# Patient Record
Sex: Female | Born: 1945 | Race: White | Hispanic: No | Marital: Married | Smoking: Former smoker
Health system: Southern US, Community
[De-identification: ages and names within clinical notes are randomized; demographics above are authoritative.]

## PROBLEM LIST (undated history)

## (undated) DIAGNOSIS — Z9289 Personal history of other medical treatment: Secondary | ICD-10-CM

## (undated) DIAGNOSIS — Z720 Tobacco use: Secondary | ICD-10-CM

## (undated) DIAGNOSIS — E1029 Type 1 diabetes mellitus with other diabetic kidney complication: Secondary | ICD-10-CM

## (undated) DIAGNOSIS — I10 Essential (primary) hypertension: Secondary | ICD-10-CM

## (undated) DIAGNOSIS — Z87898 Personal history of other specified conditions: Secondary | ICD-10-CM

## (undated) DIAGNOSIS — F329 Major depressive disorder, single episode, unspecified: Secondary | ICD-10-CM

## (undated) DIAGNOSIS — S62609A Fracture of unspecified phalanx of unspecified finger, initial encounter for closed fracture: Secondary | ICD-10-CM

## (undated) DIAGNOSIS — W19XXXA Unspecified fall, initial encounter: Secondary | ICD-10-CM

## (undated) DIAGNOSIS — K219 Gastro-esophageal reflux disease without esophagitis: Secondary | ICD-10-CM

## (undated) DIAGNOSIS — E079 Disorder of thyroid, unspecified: Secondary | ICD-10-CM

## (undated) DIAGNOSIS — S4290XA Fracture of unspecified shoulder girdle, part unspecified, initial encounter for closed fracture: Secondary | ICD-10-CM

## (undated) DIAGNOSIS — D649 Anemia, unspecified: Secondary | ICD-10-CM

## (undated) DIAGNOSIS — J309 Allergic rhinitis, unspecified: Secondary | ICD-10-CM

## (undated) DIAGNOSIS — J189 Pneumonia, unspecified organism: Secondary | ICD-10-CM

## (undated) DIAGNOSIS — N3010 Interstitial cystitis (chronic) without hematuria: Secondary | ICD-10-CM

## (undated) DIAGNOSIS — E538 Deficiency of other specified B group vitamins: Secondary | ICD-10-CM

## (undated) DIAGNOSIS — H8110 Benign paroxysmal vertigo, unspecified ear: Secondary | ICD-10-CM

## (undated) DIAGNOSIS — F319 Bipolar disorder, unspecified: Secondary | ICD-10-CM

## (undated) DIAGNOSIS — M542 Cervicalgia: Secondary | ICD-10-CM

## (undated) DIAGNOSIS — Y92129 Unspecified place in nursing home as the place of occurrence of the external cause: Secondary | ICD-10-CM

## (undated) DIAGNOSIS — I509 Heart failure, unspecified: Secondary | ICD-10-CM

## (undated) DIAGNOSIS — E7800 Pure hypercholesterolemia, unspecified: Secondary | ICD-10-CM

## (undated) DIAGNOSIS — J449 Chronic obstructive pulmonary disease, unspecified: Secondary | ICD-10-CM

## (undated) DIAGNOSIS — H409 Unspecified glaucoma: Secondary | ICD-10-CM

## (undated) DIAGNOSIS — M1990 Unspecified osteoarthritis, unspecified site: Secondary | ICD-10-CM

## (undated) DIAGNOSIS — G43909 Migraine, unspecified, not intractable, without status migrainosus: Secondary | ICD-10-CM

## (undated) DIAGNOSIS — F419 Anxiety disorder, unspecified: Secondary | ICD-10-CM

## (undated) DIAGNOSIS — S92919A Unspecified fracture of unspecified toe(s), initial encounter for closed fracture: Secondary | ICD-10-CM

## (undated) DIAGNOSIS — I129 Hypertensive chronic kidney disease with stage 1 through stage 4 chronic kidney disease, or unspecified chronic kidney disease: Secondary | ICD-10-CM

## (undated) DIAGNOSIS — N183 Chronic kidney disease, stage 3 unspecified: Secondary | ICD-10-CM

## (undated) DIAGNOSIS — E039 Hypothyroidism, unspecified: Secondary | ICD-10-CM

## (undated) DIAGNOSIS — R748 Abnormal levels of other serum enzymes: Secondary | ICD-10-CM

## (undated) DIAGNOSIS — L409 Psoriasis, unspecified: Secondary | ICD-10-CM

## (undated) DIAGNOSIS — F32A Depression, unspecified: Secondary | ICD-10-CM

## (undated) DIAGNOSIS — I639 Cerebral infarction, unspecified: Secondary | ICD-10-CM

## (undated) HISTORY — DX: Fracture of unspecified shoulder girdle, part unspecified, initial encounter for closed fracture: S42.90XA

## (undated) HISTORY — PX: APPENDECTOMY: SHX54

## (undated) HISTORY — DX: Psoriasis, unspecified: L40.9

## (undated) HISTORY — DX: Migraine, unspecified, not intractable, without status migrainosus: G43.909

## (undated) HISTORY — DX: Major depressive disorder, single episode, unspecified: F32.9

## (undated) HISTORY — DX: Bipolar disorder, unspecified: F31.9

## (undated) HISTORY — DX: Deficiency of other specified B group vitamins: E53.8

## (undated) HISTORY — DX: Interstitial cystitis (chronic) without hematuria: N30.10

## (undated) HISTORY — DX: Chronic kidney disease, stage 3 unspecified: N18.30

## (undated) HISTORY — PX: BACK SURGERY: SHX140

## (undated) HISTORY — PX: DILATION AND CURETTAGE OF UTERUS: SHX78

## (undated) HISTORY — DX: Chronic kidney disease, stage 3 (moderate): N18.3

## (undated) HISTORY — DX: Abnormal levels of other serum enzymes: R74.8

## (undated) HISTORY — DX: Tobacco use: Z72.0

## (undated) HISTORY — PX: TUBAL LIGATION: SHX77

## (undated) HISTORY — DX: Type 1 diabetes mellitus with other diabetic kidney complication: E10.29

## (undated) HISTORY — PX: CATARACT EXTRACTION W/ INTRAOCULAR LENS  IMPLANT, BILATERAL: SHX1307

## (undated) HISTORY — PX: TONSILLECTOMY: SUR1361

## (undated) HISTORY — DX: Depression, unspecified: F32.A

## (undated) HISTORY — PX: HERNIA REPAIR: SHX51

## (undated) HISTORY — DX: Allergic rhinitis, unspecified: J30.9

## (undated) HISTORY — DX: Personal history of other specified conditions: Z87.898

## (undated) HISTORY — DX: Fracture of unspecified phalanx of unspecified finger, initial encounter for closed fracture: S62.609A

## (undated) HISTORY — PX: BLADDER SUSPENSION: SHX72

## (undated) HISTORY — DX: Unspecified glaucoma: H40.9

## (undated) HISTORY — DX: Unspecified fracture of unspecified toe(s), initial encounter for closed fracture: S92.919A

## (undated) HISTORY — PX: ANTERIOR CERVICAL DECOMP/DISCECTOMY FUSION: SHX1161

## (undated) HISTORY — PX: VAGINAL HYSTERECTOMY: SUR661

## (undated) HISTORY — DX: Anxiety disorder, unspecified: F41.9

## (undated) HISTORY — PX: LAPAROSCOPIC CHOLECYSTECTOMY: SUR755

## (undated) HISTORY — DX: Cerebral infarction, unspecified: I63.9

## (undated) HISTORY — DX: Hypertensive chronic kidney disease with stage 1 through stage 4 chronic kidney disease, or unspecified chronic kidney disease: I12.9

## (undated) HISTORY — DX: Cervicalgia: M54.2

## (undated) HISTORY — DX: Hypothyroidism, unspecified: E03.9

## (undated) HISTORY — DX: Gastro-esophageal reflux disease without esophagitis: K21.9

## (undated) HISTORY — DX: Benign paroxysmal vertigo, unspecified ear: H81.10

---

## 1999-08-10 ENCOUNTER — Encounter: Payer: Self-pay | Admitting: Emergency Medicine

## 1999-08-10 ENCOUNTER — Emergency Department (HOSPITAL_COMMUNITY): Admission: EM | Admit: 1999-08-10 | Discharge: 1999-08-10 | Payer: Self-pay | Admitting: Emergency Medicine

## 1999-09-30 ENCOUNTER — Encounter: Payer: Self-pay | Admitting: Neurosurgery

## 1999-09-30 ENCOUNTER — Ambulatory Visit (HOSPITAL_COMMUNITY): Admission: RE | Admit: 1999-09-30 | Discharge: 1999-09-30 | Payer: Self-pay | Admitting: Neurosurgery

## 1999-11-05 ENCOUNTER — Encounter: Payer: Self-pay | Admitting: Neurosurgery

## 1999-11-06 ENCOUNTER — Observation Stay (HOSPITAL_COMMUNITY): Admission: RE | Admit: 1999-11-06 | Discharge: 1999-11-07 | Payer: Self-pay | Admitting: Neurosurgery

## 1999-11-06 ENCOUNTER — Encounter: Payer: Self-pay | Admitting: Neurosurgery

## 1999-12-24 ENCOUNTER — Ambulatory Visit (HOSPITAL_COMMUNITY): Admission: RE | Admit: 1999-12-24 | Discharge: 1999-12-24 | Payer: Self-pay | Admitting: Neurosurgery

## 1999-12-24 ENCOUNTER — Encounter: Payer: Self-pay | Admitting: Neurosurgery

## 2000-02-01 ENCOUNTER — Ambulatory Visit (HOSPITAL_COMMUNITY): Admission: RE | Admit: 2000-02-01 | Discharge: 2000-02-01 | Payer: Self-pay | Admitting: Neurosurgery

## 2000-02-01 ENCOUNTER — Encounter: Payer: Self-pay | Admitting: Neurosurgery

## 2001-03-31 ENCOUNTER — Ambulatory Visit (HOSPITAL_COMMUNITY): Admission: RE | Admit: 2001-03-31 | Discharge: 2001-03-31 | Payer: Self-pay | Admitting: Urology

## 2005-04-10 ENCOUNTER — Ambulatory Visit: Payer: Self-pay | Admitting: Otolaryngology

## 2006-02-18 ENCOUNTER — Ambulatory Visit: Payer: Self-pay | Admitting: Specialist

## 2006-07-07 ENCOUNTER — Encounter: Admission: RE | Admit: 2006-07-07 | Discharge: 2006-07-07 | Payer: Self-pay | Admitting: Neurosurgery

## 2006-10-23 ENCOUNTER — Ambulatory Visit: Payer: Self-pay | Admitting: Specialist

## 2006-12-11 ENCOUNTER — Other Ambulatory Visit: Payer: Self-pay

## 2006-12-11 ENCOUNTER — Emergency Department: Payer: Self-pay | Admitting: Unknown Physician Specialty

## 2006-12-29 ENCOUNTER — Inpatient Hospital Stay: Payer: Self-pay | Admitting: Specialist

## 2006-12-29 ENCOUNTER — Other Ambulatory Visit: Payer: Self-pay

## 2006-12-30 ENCOUNTER — Other Ambulatory Visit: Payer: Self-pay

## 2007-01-13 ENCOUNTER — Ambulatory Visit: Payer: Self-pay | Admitting: Unknown Physician Specialty

## 2007-01-16 ENCOUNTER — Ambulatory Visit: Payer: Self-pay | Admitting: Unknown Physician Specialty

## 2007-03-04 ENCOUNTER — Observation Stay: Payer: Self-pay | Admitting: Specialist

## 2007-05-26 ENCOUNTER — Ambulatory Visit (HOSPITAL_BASED_OUTPATIENT_CLINIC_OR_DEPARTMENT_OTHER): Admission: RE | Admit: 2007-05-26 | Discharge: 2007-05-26 | Payer: Self-pay | Admitting: Urology

## 2007-06-23 ENCOUNTER — Other Ambulatory Visit: Payer: Self-pay

## 2007-06-23 ENCOUNTER — Ambulatory Visit: Payer: Self-pay | Admitting: Specialist

## 2007-07-03 ENCOUNTER — Other Ambulatory Visit: Payer: Self-pay

## 2007-07-03 ENCOUNTER — Observation Stay: Payer: Self-pay | Admitting: Specialist

## 2007-09-21 ENCOUNTER — Emergency Department: Payer: Self-pay | Admitting: Unknown Physician Specialty

## 2007-09-21 ENCOUNTER — Other Ambulatory Visit: Payer: Self-pay

## 2008-05-10 ENCOUNTER — Ambulatory Visit (HOSPITAL_BASED_OUTPATIENT_CLINIC_OR_DEPARTMENT_OTHER): Admission: RE | Admit: 2008-05-10 | Discharge: 2008-05-10 | Payer: Self-pay | Admitting: Urology

## 2008-07-15 ENCOUNTER — Ambulatory Visit: Payer: Self-pay | Admitting: Family Medicine

## 2009-04-17 ENCOUNTER — Observation Stay: Payer: Self-pay | Admitting: Internal Medicine

## 2009-09-29 ENCOUNTER — Ambulatory Visit: Payer: Self-pay | Admitting: Family Medicine

## 2009-10-03 ENCOUNTER — Emergency Department: Payer: Self-pay | Admitting: Emergency Medicine

## 2010-04-09 ENCOUNTER — Ambulatory Visit: Payer: Self-pay | Admitting: Family Medicine

## 2010-05-23 ENCOUNTER — Emergency Department: Payer: Self-pay | Admitting: Internal Medicine

## 2010-11-18 ENCOUNTER — Emergency Department: Payer: Self-pay | Admitting: Emergency Medicine

## 2010-11-21 ENCOUNTER — Ambulatory Visit: Payer: Self-pay | Admitting: Gastroenterology

## 2010-11-23 LAB — PATHOLOGY REPORT

## 2010-11-28 ENCOUNTER — Emergency Department: Payer: Self-pay | Admitting: Emergency Medicine

## 2011-02-19 ENCOUNTER — Ambulatory Visit: Payer: Self-pay

## 2011-04-30 NOTE — Op Note (Signed)
NAME:  Cassandra [name], Cassandra [name]             ACCOUNT NO.:  [id number]   MEDICAL RECORD NO.:  [id number]          PATIENT TYPE:  AMB   LOCATION:  [facility]                         FACILITY:  [facility]   PHYSICIAN:  Jamison Neighbor, M.D.  DATE OF BIRTH:  May 13, 1946   DATE OF PROCEDURE:  DATE OF DISCHARGE:                               OPERATIVE REPORT   PREOPERATIVE DIAGNOSES:  Interstitial cystitis.   POSTOPERATIVE DIAGNOSIS:  Interstitial cystitis.   PROCEDURE:  1. Cystoscopy.  2. Urethral calibration.  3. Hydrodistention of the bladder.  4. Marcaine and Pyridium instillation.  5. Marcaine and Kenalog injection.   SURGEON:  Jamison Neighbor, M.D.   ANESTHESIA:  General.   COMPLICATIONS:  None.   DRAINS:  None.   BRIEF HISTORY:  This 65 year old female has a past history of  interstitial cystitis as proven by cystoscopy and hydrodistention.  The  patient also has a history of cystocele and stress incontinence  successfully treated by surgical repair.  The patient presented to the  office on a[right 28 and she had no problems with stress incontinence or  prolapse, but that she is having ongoing issues with urgency and  frequency.  The patient was told that she should consider doing Elmiron  or instillation therapy, but she dislikes medication and requested that  a repeat hydrodistention be performed.  Review of the record showed that  she had an excellent response in the past with several years of relief  and it certainly seems reasonable to reassess her in this fashion.  The  patient understands there is no guarantee she would have a comparable  result to what she has had before.  She is aware of the waxing and  waning nature of disease.  She gave full informed consent.   DESCRIPTION OF PROCEDURE:  After successful induction of general  anesthesia,  the patient was placed in the dorsal lithotomy position,  prepped with Betadine and draped in the usual sterile fashion.  Careful  bimanual  examination showed no cystocele.  The urethra was well-  supported.  The vaginal vault had adequate length.  There was no  evidence of enterocele or rectocele and she seemed to be quite well  supported.  There was nothing that would suggest diverticulum.  The  urethra was calibrated to 30-French with female urethral sounds with no  evidence of stenosis or stricture.  The cystoscope was inserted.  The  bladder was carefully inspected.  No tumors or stones could be seen.  The mucosa was somewhat pain in its appearance and were areas that  appeared more consistent with fibrosis as opposed to normal healthy  appearing mucosa.  There were significant areas with diminished  vascularity as well as areas of somewhat patchy needle vascularity.  The  bladder was distended at a pressure of 100 cm of water for 5 minutes.  When the bladder was drained, granulations could be seen throughout the  bladder with a relatively modest bladder capacity was fairly normal in  800 mL.  This compares to the  normal bladder capacity of between 11 and  1200 mL and an  average IC bladder capacity of approximately 575.  Careful inspection showed nothing suggestive of CIS or it was nothing  required biopsy.  The bladder was drained.  A mixture of Marcaine and  Pyridium were left in the bladder.  Marcaine and Kenalog were injected  periurethral.  The patient tolerated procedure was taken to recovery in  good condition.   She will be sent home with Tylox, Pyridium Plus and doxycycline.  When  she returns, if she has had improvement,  will suggest a  course of  instillation therapy with anesthetic cocktails as well as an Elmiron  based long-term treatment program.           ______________________________  Jamison Neighbor, M.D.  Electronically Signed     RJE/MEDQ  D:  05/26/2007  T:  05/26/2007  Job:  [id number]

## 2011-04-30 NOTE — Op Note (Signed)
NAME:  Cassandra [name], Cassandra [name]             ACCOUNT NO.:  [id number]   MEDICAL RECORD NO.:  [id number]          PATIENT TYPE:  AMB   LOCATION:  [facility]                         FACILITY:  [facility]   PHYSICIAN:  Jamison Neighbor, M.D.  DATE OF BIRTH:  January 28, 1946   DATE OF PROCEDURE:  05/10/2008  DATE OF DISCHARGE:                               OPERATIVE REPORT   PREOPERATIVE DIAGNOSIS:  Interstitial cystitis.   POSTOPERATIVE DIAGNOSIS:  Interstitial cystitis.   PROCEDURES:  Cystoscopy, urethral calibration, hydrodistention of the  bladder, Marcaine and Pyridium installation, Marcaine and Kenalog  injection.   SURGEON:  Jamison Neighbor, M.D.   ANESTHESIA:  General.   COMPLICATIONS:  None.   DRAINS:  None.   BRIEF HISTORY:  This 65 year old female is known to have interstitial  cystitis.  She also has associated vulvodynia.  The patient has tried  numerous medications for her vulvar pain including Lamictal and  amitriptyline, but feels these medications caused her significant  problems.  The patient does feel that the vulvodynia has improved  somewhat using Estrace and lidocaine jelly and that the Valium has  helped somewhat with her pelvic floor problems as well as her anxiety.  She has been on Elmiron but now feels that her interstitial cystitis is  worsening and has requested a repeat hydrodistention.  She does not wish  to consider instillation therapy at that time but did agree that she  might be willing to do this if the hydrodistention is not giving the  release she is looking for, she knows that there is no guarantee she  will have relief comparable to what she has had in the past.  She gave  full informed consent for the procedure.   PROCEDURE:  After successful induction of general anesthesia, the  patient was placed in the dorsal lithotomy position, prepped with  Betadine and draped in usual sterile fashion.  Careful bimanual  examination showed no cystocele, rectocele or  enterocele, there were no  masses.  On bimanual exam, the urethra was palpably normal with no signs  of a diverticulum.  The urethra was calibrated at 32-French with female  urethral sounds but no evidence of stenosis or stricture.  The  cystoscope was inserted.  The bladder was carefully inspected.  No  tumors or stones could be seen.  Both ureteral orifices were normal in  configuration and location.  Hydrodistention of the bladder was  performed.  The bladder was distended at a pressure of 100 cm of water  for 5 minutes.  When the bladder was drained, the bladder capacity was  found to be 800 mL, which is much better than the average IC capacity of  575, but less than a more normal bladder capacity which should be  approximately 1150 mL.  The patient had exactly the same capacity at her  last hydrodistention.  The number of granulations were quite minimal.  There were no ulcers,  nothing requiring either biopsy or fulguration.  The bladder was  drained.  A mixture of Marcaine and Pyridium was left in the bladder.  A  mixture of Marcaine and  Kenalog was injected periurethrally.  The  patient tolerated the procedure well and was taken to the recovery room  in good condition.      Jamison Neighbor, M.D.  Electronically Signed     RJE/MEDQ  D:  05/10/2008  T:  05/10/2008  Job:  [id number]

## 2011-05-03 ENCOUNTER — Ambulatory Visit: Payer: Self-pay

## 2011-05-19 ENCOUNTER — Emergency Department: Payer: Self-pay | Admitting: Unknown Physician Specialty

## 2011-07-31 ENCOUNTER — Observation Stay: Payer: Self-pay | Admitting: Internal Medicine

## 2011-09-11 LAB — POCT I-STAT 4, (NA,K, GLUC, HGB,HCT)
Glucose, Bld: 254 — ABNORMAL HIGH
HCT: 40
Operator id: 268271
Potassium: 4.4

## 2011-10-03 LAB — POCT I-STAT 4, (NA,K, GLUC, HGB,HCT)
Glucose, Bld: 156 — ABNORMAL HIGH
HCT: 37
Potassium: 4.2

## 2011-11-19 ENCOUNTER — Inpatient Hospital Stay: Payer: Self-pay | Admitting: Internal Medicine

## 2012-06-16 ENCOUNTER — Emergency Department: Payer: Self-pay | Admitting: Emergency Medicine

## 2012-06-16 LAB — CBC
HCT: 40.7 % (ref 35.0–47.0)
HGB: 14.1 g/dL (ref 12.0–16.0)
MCH: 33 pg (ref 26.0–34.0)
MCHC: 34.6 g/dL (ref 32.0–36.0)

## 2012-06-16 LAB — DRUG SCREEN, URINE
Amphetamines, Ur Screen: NEGATIVE (ref ?–1000)
Benzodiazepine, Ur Scrn: NEGATIVE (ref ?–200)
Cannabinoid 50 Ng, Ur ~~LOC~~: NEGATIVE (ref ?–50)
MDMA (Ecstasy)Ur Screen: NEGATIVE (ref ?–500)
Tricyclic, Ur Screen: NEGATIVE (ref ?–1000)

## 2012-06-16 LAB — URINALYSIS, COMPLETE
Bacteria: NONE SEEN
Glucose,UR: NEGATIVE mg/dL (ref 0–75)
Specific Gravity: 1.003 (ref 1.003–1.030)
Squamous Epithelial: 1
WBC UR: 1 /HPF (ref 0–5)

## 2012-06-16 LAB — BASIC METABOLIC PANEL
Anion Gap: 9 (ref 7–16)
Co2: 29 mmol/L (ref 21–32)
EGFR (African American): >60
Sodium: 140 mmol/L (ref 136–145)

## 2012-06-16 LAB — ETHANOL: Ethanol %: 0.253 % — ABNORMAL HIGH (ref 0.000–0.080)

## 2012-10-16 LAB — HM COLONOSCOPY

## 2012-12-18 ENCOUNTER — Ambulatory Visit: Payer: Self-pay | Admitting: Ophthalmology

## 2012-12-23 ENCOUNTER — Ambulatory Visit: Payer: Self-pay | Admitting: Ophthalmology

## 2013-01-19 ENCOUNTER — Ambulatory Visit: Payer: Self-pay | Admitting: Ophthalmology

## 2013-01-19 LAB — POTASSIUM: Potassium: 4.6 mmol/L (ref 3.5–5.1)

## 2013-01-27 ENCOUNTER — Ambulatory Visit: Payer: Self-pay | Admitting: Ophthalmology

## 2013-05-21 ENCOUNTER — Emergency Department: Payer: Self-pay | Admitting: Emergency Medicine

## 2013-06-09 ENCOUNTER — Ambulatory Visit: Payer: Self-pay | Admitting: Family Medicine

## 2013-10-06 LAB — TROPONIN I: Troponin-I: <0.02 ng/mL

## 2013-10-06 LAB — CBC WITH DIFFERENTIAL/PLATELET
Eosinophil #: 0.5 10*3/uL (ref 0.0–0.7)
MCHC: 35.1 g/dL (ref 32.0–36.0)
MCV: 94 fL (ref 80–100)
Monocyte #: 0.7 x10 3/mm (ref 0.2–0.9)
Monocyte %: 8.1 %
Neutrophil #: 4.6 10*3/uL (ref 1.4–6.5)
Neutrophil %: 53.5 %
Platelet: 230 10*3/uL (ref 150–440)

## 2013-10-06 LAB — BASIC METABOLIC PANEL
BUN: 21 mg/dL — ABNORMAL HIGH (ref 7–18)
EGFR (Non-African Amer.): 46 — ABNORMAL LOW
Osmolality: 284 (ref 275–301)

## 2013-10-06 LAB — HEPATIC FUNCTION PANEL A (ARMC): SGOT(AST): 28 U/L (ref 15–37)

## 2013-10-07 ENCOUNTER — Observation Stay: Payer: Self-pay | Admitting: Internal Medicine

## 2013-10-07 LAB — URINALYSIS, COMPLETE
Bacteria: NONE SEEN
Hyaline Cast: 20
Ketone: NEGATIVE
Leukocyte Esterase: NEGATIVE
Protein: NEGATIVE
WBC UR: 2 /HPF (ref 0–5)

## 2013-10-07 LAB — BASIC METABOLIC PANEL
BUN: 16 mg/dL (ref 7–18)
Calcium, Total: 7.7 mg/dL — ABNORMAL LOW (ref 8.5–10.1)
Chloride: 111 mmol/L — ABNORMAL HIGH (ref 98–107)
Co2: 23 mmol/L (ref 21–32)
Creatinine: 1.07 mg/dL (ref 0.60–1.30)
EGFR (Non-African Amer.): 54 — ABNORMAL LOW
Osmolality: 291 (ref 275–301)

## 2013-10-07 LAB — CK TOTAL AND CKMB (NOT AT ARMC): CK, Total: 72 U/L (ref 21–215)

## 2013-10-07 LAB — HEMOGLOBIN A1C: Hemoglobin A1C: 7.5 % — ABNORMAL HIGH (ref 4.2–6.3)

## 2013-10-07 LAB — TSH: Thyroid Stimulating Horm: 0.902 u[IU]/mL

## 2013-10-07 LAB — TROPONIN I: Troponin-I: <0.02 ng/mL

## 2013-10-08 LAB — CBC WITH DIFFERENTIAL/PLATELET
Basophil #: 0.1 10*3/uL (ref 0.0–0.1)
Eosinophil #: 0.5 10*3/uL (ref 0.0–0.7)
Eosinophil %: 7.3 %
HCT: 23.7 % — ABNORMAL LOW (ref 35.0–47.0)
MCH: 34.2 pg — ABNORMAL HIGH (ref 26.0–34.0)
MCHC: 36.3 g/dL — ABNORMAL HIGH (ref 32.0–36.0)
MCV: 94 fL (ref 80–100)
Neutrophil %: 58.1 %
RBC: 2.51 10*6/uL — ABNORMAL LOW (ref 3.80–5.20)

## 2013-10-08 LAB — PROTIME-INR
INR: 0.9
Prothrombin Time: 12.5 secs (ref 11.5–14.7)

## 2013-10-08 LAB — RETICULOCYTES
Absolute Retic Count: 0.089 10*6/uL (ref 0.019–0.186)
Reticulocyte: 2.8 % (ref 0.4–3.1)

## 2013-10-08 LAB — BASIC METABOLIC PANEL
Anion Gap: 6 — ABNORMAL LOW (ref 7–16)
BUN: 8 mg/dL (ref 7–18)
Calcium, Total: 7.6 mg/dL — ABNORMAL LOW (ref 8.5–10.1)
Chloride: 109 mmol/L — ABNORMAL HIGH (ref 98–107)
Creatinine: 0.67 mg/dL (ref 0.60–1.30)
EGFR (African American): >60
Glucose: 130 mg/dL — ABNORMAL HIGH (ref 65–99)
Potassium: 4.2 mmol/L (ref 3.5–5.1)
Sodium: 139 mmol/L (ref 136–145)

## 2013-10-08 LAB — APTT: Activated PTT: 31 secs (ref 23.6–35.9)

## 2013-10-08 LAB — IRON AND TIBC
Iron Bind.Cap.(Total): 355 ug/dL (ref 250–450)
Iron Saturation: 36 %
Iron: 127 ug/dL (ref 50–170)
Unbound Iron-Bind.Cap.: 228 ug/dL

## 2013-10-08 LAB — LACTATE DEHYDROGENASE: LDH: 225 U/L (ref 81–246)

## 2013-10-09 LAB — CBC WITH DIFFERENTIAL/PLATELET
Basophil %: 1.3 %
Eosinophil #: 0.5 10*3/uL (ref 0.0–0.7)
HCT: 21.4 % — ABNORMAL LOW (ref 35.0–47.0)
HGB: 7.6 g/dL — ABNORMAL LOW (ref 12.0–16.0)
Lymphocyte #: 2 10*3/uL (ref 1.0–3.6)
Lymphocyte %: 31.5 %
MCHC: 35.7 g/dL (ref 32.0–36.0)
MCV: 94 fL (ref 80–100)
Monocyte #: 0.6 x10 3/mm (ref 0.2–0.9)
Monocyte %: 9.2 %
Neutrophil #: 3.2 10*3/uL (ref 1.4–6.5)
Platelet: 165 10*3/uL (ref 150–440)
RBC: 2.27 10*6/uL — ABNORMAL LOW (ref 3.80–5.20)

## 2013-10-09 LAB — BASIC METABOLIC PANEL
BUN: 6 mg/dL — ABNORMAL LOW (ref 7–18)
Calcium, Total: 8 mg/dL — ABNORMAL LOW (ref 8.5–10.1)
Creatinine: 0.74 mg/dL (ref 0.60–1.30)
Sodium: 140 mmol/L (ref 136–145)

## 2013-10-10 LAB — CBC WITH DIFFERENTIAL/PLATELET
Basophil #: 0.1 10*3/uL (ref 0.0–0.1)
Basophil %: 1 %
Eosinophil #: 0.6 10*3/uL (ref 0.0–0.7)
Eosinophil %: 7.5 %
Lymphocyte %: 22.3 %
MCHC: 35.4 g/dL (ref 32.0–36.0)
Neutrophil %: 61.1 %
Platelet: 181 10*3/uL (ref 150–440)
RBC: 2.28 10*6/uL — ABNORMAL LOW (ref 3.80–5.20)
RDW: 13 % (ref 11.5–14.5)

## 2013-10-11 ENCOUNTER — Ambulatory Visit: Payer: Self-pay | Admitting: Oncology

## 2013-10-11 LAB — HEMOGLOBIN: HGB: 8.2 g/dL — ABNORMAL LOW (ref 12.0–16.0)

## 2013-11-01 ENCOUNTER — Ambulatory Visit: Payer: Self-pay | Admitting: Ophthalmology

## 2013-11-15 ENCOUNTER — Ambulatory Visit: Payer: Self-pay | Admitting: Oncology

## 2013-11-15 LAB — IRON AND TIBC
Iron Bind.Cap.(Total): 484 ug/dL — ABNORMAL HIGH (ref 250–450)
Iron Saturation: 5 %
Iron: 24 ug/dL — ABNORMAL LOW (ref 50–170)

## 2013-11-15 LAB — CBC CANCER CENTER
Basophil #: 0.1 x10 3/mm (ref 0.0–0.1)
Eosinophil #: 0.7 x10 3/mm (ref 0.0–0.7)
Eosinophil %: 7.1 %
HCT: 31.4 % — ABNORMAL LOW (ref 35.0–47.0)
HGB: 10.2 g/dL — ABNORMAL LOW (ref 12.0–16.0)
Lymphocyte #: 3.7 x10 3/mm — ABNORMAL HIGH (ref 1.0–3.6)
Lymphocyte %: 39.2 %
MCH: 28.3 pg (ref 26.0–34.0)
MCV: 87 fL (ref 80–100)
Monocyte #: 0.6 x10 3/mm (ref 0.2–0.9)
Neutrophil #: 4.3 x10 3/mm (ref 1.4–6.5)
Neutrophil %: 45.9 %
RBC: 3.6 10*6/uL — ABNORMAL LOW (ref 3.80–5.20)

## 2013-11-15 LAB — FERRITIN: Ferritin (ARMC): 7 ng/mL — ABNORMAL LOW (ref 8–388)

## 2013-11-16 LAB — PROT IMMUNOELECTROPHORES(ARMC)

## 2013-12-16 ENCOUNTER — Ambulatory Visit: Payer: Self-pay | Admitting: Oncology

## 2014-02-11 ENCOUNTER — Ambulatory Visit: Payer: Self-pay | Admitting: Oncology

## 2014-02-11 LAB — CBC CANCER CENTER
BASOS ABS: 0.1 x10 3/mm (ref 0.0–0.1)
Basophil %: 0.7 %
EOS PCT: 4.3 %
Eosinophil #: 0.4 x10 3/mm (ref 0.0–0.7)
HCT: 42 % (ref 35.0–47.0)
HGB: 14.2 g/dL (ref 12.0–16.0)
LYMPHS ABS: 2.5 x10 3/mm (ref 1.0–3.6)
Lymphocyte %: 27.4 %
MCH: 30.4 pg (ref 26.0–34.0)
MCHC: 33.8 g/dL (ref 32.0–36.0)
MCV: 90 fL (ref 80–100)
Monocyte #: 0.7 x10 3/mm (ref 0.2–0.9)
Monocyte %: 7.1 %
Neutrophil #: 5.6 x10 3/mm (ref 1.4–6.5)
Neutrophil %: 60.5 %
Platelet: 354 x10 3/mm (ref 150–440)
RBC: 4.66 10*6/uL (ref 3.80–5.20)
RDW: 16.7 % — ABNORMAL HIGH (ref 11.5–14.5)
WBC: 9.2 x10 3/mm (ref 3.6–11.0)

## 2014-02-11 LAB — IRON AND TIBC
Iron Bind.Cap.(Total): 282 ug/dL (ref 250–450)
Iron Saturation: 30 %
Iron: 85 ug/dL (ref 50–170)
UNBOUND IRON-BIND. CAP.: 197 ug/dL

## 2014-02-11 LAB — FERRITIN: Ferritin (ARMC): 233 ng/mL (ref 8–388)

## 2014-02-13 ENCOUNTER — Ambulatory Visit: Payer: Self-pay | Admitting: Oncology

## 2014-04-20 ENCOUNTER — Ambulatory Visit: Payer: Self-pay | Admitting: Family Medicine

## 2014-05-13 ENCOUNTER — Ambulatory Visit: Payer: Self-pay | Admitting: Oncology

## 2014-05-13 LAB — CBC CANCER CENTER
BASOS ABS: 0.1 x10 3/mm (ref 0.0–0.1)
BASOS PCT: 1.1 %
EOS PCT: 5.4 %
Eosinophil #: 0.5 x10 3/mm (ref 0.0–0.7)
HCT: 35.4 % (ref 35.0–47.0)
HGB: 12.2 g/dL (ref 12.0–16.0)
LYMPHS ABS: 1.9 x10 3/mm (ref 1.0–3.6)
Lymphocyte %: 21.9 %
MCH: 32.8 pg (ref 26.0–34.0)
MCHC: 34.3 g/dL (ref 32.0–36.0)
MCV: 96 fL (ref 80–100)
MONO ABS: 0.6 x10 3/mm (ref 0.2–0.9)
Monocyte %: 7.3 %
NEUTROS ABS: 5.7 x10 3/mm (ref 1.4–6.5)
Neutrophil %: 64.3 %
PLATELETS: 203 x10 3/mm (ref 150–440)
RBC: 3.7 10*6/uL — AB (ref 3.80–5.20)
RDW: 13.6 % (ref 11.5–14.5)
WBC: 8.9 x10 3/mm (ref 3.6–11.0)

## 2014-05-13 LAB — IRON AND TIBC
Iron Bind.Cap.(Total): 285 ug/dL (ref 250–450)
Iron Saturation: 49 %
Iron: 140 ug/dL (ref 50–170)
UNBOUND IRON-BIND. CAP.: 145 ug/dL

## 2014-05-13 LAB — FERRITIN: Ferritin (ARMC): 213 ng/mL (ref 8–388)

## 2014-05-16 ENCOUNTER — Ambulatory Visit: Payer: Self-pay | Admitting: Oncology

## 2014-06-15 ENCOUNTER — Emergency Department: Payer: Self-pay | Admitting: Emergency Medicine

## 2014-06-15 LAB — BASIC METABOLIC PANEL
Anion Gap: 12 (ref 7–16)
BUN: 16 mg/dL (ref 7–18)
CALCIUM: 9.4 mg/dL (ref 8.5–10.1)
CREATININE: 1.17 mg/dL (ref 0.60–1.30)
Chloride: 99 mmol/L (ref 98–107)
Co2: 25 mmol/L (ref 21–32)
EGFR (Non-African Amer.): 49 — ABNORMAL LOW
GFR CALC AF AMER: 56 — AB
GLUCOSE: 134 mg/dL — AB (ref 65–99)
Osmolality: 275 (ref 275–301)
POTASSIUM: 3.3 mmol/L — AB (ref 3.5–5.1)
Sodium: 136 mmol/L (ref 136–145)

## 2014-06-15 LAB — CBC WITH DIFFERENTIAL/PLATELET
Basophil #: 0.1 x10 3/mm 3
Basophil %: 0.8 %
Eosinophil #: 0.6 x10 3/mm 3
Eosinophil %: 6.2 %
HCT: 36.9 %
HGB: 13 g/dL
Lymphocyte %: 17.6 %
Lymphs Abs: 1.7 x10 3/mm 3
MCH: 33.4 pg
MCHC: 35.2 g/dL
MCV: 95 fL
Monocyte #: 0.8 "x10 3/mm "
Monocyte %: 7.8 %
Neutrophil #: 6.7 x10 3/mm 3 — ABNORMAL HIGH
Neutrophil %: 67.6 %
Platelet: 210 x10 3/mm 3
RBC: 3.9 X10 6/mm 3
RDW: 13.5 %
WBC: 9.9 x10 3/mm 3

## 2014-06-15 LAB — URINALYSIS, COMPLETE
BACTERIA: NONE SEEN
Bilirubin,UR: NEGATIVE
Glucose,UR: NEGATIVE mg/dL (ref 0–75)
KETONE: NEGATIVE
Leukocyte Esterase: NEGATIVE
NITRITE: NEGATIVE
PH: 5 (ref 4.5–8.0)
Protein: 100
RBC,UR: 3 /HPF (ref 0–5)
SPECIFIC GRAVITY: 1.01 (ref 1.003–1.030)
Squamous Epithelial: 2
WBC UR: 1 /HPF (ref 0–5)

## 2014-06-15 LAB — ETHANOL
ETHANOL LVL: 56 mg/dL
Ethanol %: 0.056 % (ref 0.000–0.080)

## 2014-08-12 ENCOUNTER — Ambulatory Visit: Payer: Self-pay | Admitting: Oncology

## 2014-08-12 LAB — CBC CANCER CENTER
BASOS PCT: 1.3 %
Basophil #: 0.1 x10 3/mm (ref 0.0–0.1)
Eosinophil #: 0.4 x10 3/mm (ref 0.0–0.7)
Eosinophil %: 4 %
HCT: 40.5 % (ref 35.0–47.0)
HGB: 13.7 g/dL (ref 12.0–16.0)
LYMPHS ABS: 2.5 x10 3/mm (ref 1.0–3.6)
Lymphocyte %: 25.5 %
MCH: 33.1 pg (ref 26.0–34.0)
MCHC: 33.9 g/dL (ref 32.0–36.0)
MCV: 98 fL (ref 80–100)
MONOS PCT: 7.4 %
Monocyte #: 0.7 x10 3/mm (ref 0.2–0.9)
NEUTROS PCT: 61.8 %
Neutrophil #: 6.2 x10 3/mm (ref 1.4–6.5)
PLATELETS: 267 x10 3/mm (ref 150–440)
RBC: 4.14 10*6/uL (ref 3.80–5.20)
RDW: 12.6 % (ref 11.5–14.5)
WBC: 10 x10 3/mm (ref 3.6–11.0)

## 2014-08-12 LAB — IRON AND TIBC
IRON SATURATION: 38 %
Iron Bind.Cap.(Total): 301 ug/dL (ref 250–450)
Iron: 114 ug/dL (ref 50–170)
Unbound Iron-Bind.Cap.: 187 ug/dL

## 2014-08-12 LAB — FERRITIN: Ferritin (ARMC): 105 ng/mL (ref 8–388)

## 2014-08-16 ENCOUNTER — Ambulatory Visit: Payer: Self-pay | Admitting: Oncology

## 2014-09-05 ENCOUNTER — Other Ambulatory Visit: Payer: Self-pay | Admitting: Family Medicine

## 2014-09-05 DIAGNOSIS — R319 Hematuria, unspecified: Secondary | ICD-10-CM

## 2014-09-05 DIAGNOSIS — M549 Dorsalgia, unspecified: Secondary | ICD-10-CM

## 2014-09-09 ENCOUNTER — Other Ambulatory Visit: Payer: Self-pay

## 2014-10-03 ENCOUNTER — Other Ambulatory Visit (HOSPITAL_COMMUNITY): Payer: Self-pay

## 2014-10-03 ENCOUNTER — Encounter: Payer: Self-pay | Admitting: Surgery

## 2014-10-11 LAB — HM DIABETES EYE EXAM

## 2015-01-11 DIAGNOSIS — H4011X4 Primary open-angle glaucoma, indeterminate stage: Secondary | ICD-10-CM | POA: Diagnosis not present

## 2015-02-24 DIAGNOSIS — E1165 Type 2 diabetes mellitus with hyperglycemia: Secondary | ICD-10-CM | POA: Diagnosis not present

## 2015-02-24 DIAGNOSIS — I10 Essential (primary) hypertension: Secondary | ICD-10-CM | POA: Diagnosis not present

## 2015-02-24 DIAGNOSIS — E538 Deficiency of other specified B group vitamins: Secondary | ICD-10-CM | POA: Diagnosis not present

## 2015-02-24 DIAGNOSIS — E039 Hypothyroidism, unspecified: Secondary | ICD-10-CM | POA: Diagnosis not present

## 2015-02-24 DIAGNOSIS — J019 Acute sinusitis, unspecified: Secondary | ICD-10-CM | POA: Diagnosis not present

## 2015-03-03 ENCOUNTER — Other Ambulatory Visit: Payer: Self-pay

## 2015-03-03 ENCOUNTER — Encounter (HOSPITAL_COMMUNITY): Payer: Self-pay | Admitting: Family Medicine

## 2015-03-03 ENCOUNTER — Emergency Department (HOSPITAL_COMMUNITY)
Admission: EM | Admit: 2015-03-03 | Discharge: 2015-03-03 | Disposition: A | Discharge status: Home / Self Care | Payer: Medicare Other | Attending: Emergency Medicine | Admitting: Emergency Medicine

## 2015-03-03 DIAGNOSIS — R05 Cough: Secondary | ICD-10-CM | POA: Insufficient documentation

## 2015-03-03 DIAGNOSIS — Z720 Tobacco use: Secondary | ICD-10-CM | POA: Diagnosis not present

## 2015-03-03 DIAGNOSIS — E1165 Type 2 diabetes mellitus with hyperglycemia: Secondary | ICD-10-CM | POA: Diagnosis not present

## 2015-03-03 DIAGNOSIS — J029 Acute pharyngitis, unspecified: Secondary | ICD-10-CM | POA: Diagnosis not present

## 2015-03-03 DIAGNOSIS — K3184 Gastroparesis: Secondary | ICD-10-CM | POA: Diagnosis not present

## 2015-03-03 DIAGNOSIS — R630 Anorexia: Secondary | ICD-10-CM | POA: Insufficient documentation

## 2015-03-03 DIAGNOSIS — R739 Hyperglycemia, unspecified: Secondary | ICD-10-CM

## 2015-03-03 DIAGNOSIS — E1143 Type 2 diabetes mellitus with diabetic autonomic (poly)neuropathy: Secondary | ICD-10-CM | POA: Diagnosis not present

## 2015-03-03 DIAGNOSIS — I10 Essential (primary) hypertension: Secondary | ICD-10-CM | POA: Diagnosis not present

## 2015-03-03 DIAGNOSIS — R42 Dizziness and giddiness: Secondary | ICD-10-CM | POA: Diagnosis present

## 2015-03-03 HISTORY — DX: Disorder of thyroid, unspecified: E07.9

## 2015-03-03 HISTORY — DX: Essential (primary) hypertension: I10

## 2015-03-03 LAB — URINE MICROSCOPIC-ADD ON

## 2015-03-03 LAB — COMPREHENSIVE METABOLIC PANEL
ALBUMIN: 3.7 g/dL (ref 3.5–5.2)
ALT: 16 U/L (ref 0–35)
AST: 29 U/L (ref 0–37)
Alkaline Phosphatase: 55 U/L (ref 39–117)
Anion gap: 11 (ref 5–15)
BILIRUBIN TOTAL: 0.8 mg/dL (ref 0.3–1.2)
BUN: 20 mg/dL (ref 6–23)
CALCIUM: 9.8 mg/dL (ref 8.4–10.5)
CHLORIDE: 95 mmol/L — AB (ref 96–112)
CO2: 29 mmol/L (ref 19–32)
CREATININE: 1.5 mg/dL — AB (ref 0.50–1.10)
GFR calc Af Amer: 40 mL/min — ABNORMAL LOW (ref >90)
GFR calc non Af Amer: 35 mL/min — ABNORMAL LOW (ref >90)
Glucose, Bld: 410 mg/dL — ABNORMAL HIGH (ref 70–99)
Potassium: 3.9 mmol/L (ref 3.5–5.1)
Sodium: 135 mmol/L (ref 135–145)
TOTAL PROTEIN: 7.2 g/dL (ref 6.0–8.3)

## 2015-03-03 LAB — CBC
HCT: 40.5 % (ref 36.0–46.0)
Hemoglobin: 14.4 g/dL (ref 12.0–15.0)
MCH: 32.4 pg (ref 26.0–34.0)
MCHC: 35.6 g/dL (ref 30.0–36.0)
MCV: 91 fL (ref 78.0–100.0)
PLATELETS: 281 10*3/uL (ref 150–400)
RBC: 4.45 MIL/uL (ref 3.87–5.11)
RDW: 12.7 % (ref 11.5–15.5)
WBC: 10.8 10*3/uL — AB (ref 4.0–10.5)

## 2015-03-03 LAB — DIFFERENTIAL
BASOS ABS: 0.1 10*3/uL (ref 0.0–0.1)
Basophils Relative: 1 % (ref 0–1)
EOS PCT: 3 % (ref 0–5)
Eosinophils Absolute: 0.4 10*3/uL (ref 0.0–0.7)
LYMPHS ABS: 2 10*3/uL (ref 0.7–4.0)
LYMPHS PCT: 18 % (ref 12–46)
MONO ABS: 0.3 10*3/uL (ref 0.1–1.0)
MONOS PCT: 3 % (ref 3–12)
Neutro Abs: 8.1 10*3/uL — ABNORMAL HIGH (ref 1.7–7.7)
Neutrophils Relative %: 75 % (ref 43–77)

## 2015-03-03 LAB — CBG MONITORING, ED
GLUCOSE-CAPILLARY: 384 mg/dL — AB (ref 70–99)
Glucose-Capillary: 240 mg/dL — ABNORMAL HIGH (ref 70–99)

## 2015-03-03 LAB — URINALYSIS, ROUTINE W REFLEX MICROSCOPIC
BILIRUBIN URINE: NEGATIVE
Glucose, UA: >1000 mg/dL — AB
KETONES UR: 15 mg/dL — AB
Leukocytes, UA: NEGATIVE
NITRITE: NEGATIVE
PH: 7 (ref 5.0–8.0)
Protein, ur: 100 mg/dL — AB
SPECIFIC GRAVITY, URINE: 1.018 (ref 1.005–1.030)
Urobilinogen, UA: 0.2 mg/dL (ref 0.0–1.0)

## 2015-03-03 LAB — I-STAT TROPONIN, ED: Troponin i, poc: 0 ng/mL (ref 0.00–0.08)

## 2015-03-03 MED ORDER — METOCLOPRAMIDE HCL 10 MG PO TABS: 10.0000 mg | ORAL_TABLET | Freq: Three times a day (TID) | ORAL | Status: DC | PRN

## 2015-03-03 MED ORDER — SODIUM CHLORIDE 0.9 % IV BOLUS (SEPSIS)
1000.0000 mL | Freq: Once | INTRAVENOUS | Status: AC
  Administered 2015-03-03: 1000 mL via INTRAVENOUS

## 2015-03-03 MED ORDER — METOCLOPRAMIDE HCL 5 MG/ML IJ SOLN
10.0000 mg | Freq: Once | INTRAMUSCULAR | Status: AC
  Administered 2015-03-03: 10 mg via INTRAVENOUS
  Filled 2015-03-03: qty 2

## 2015-03-03 NOTE — ED Provider Notes (Signed)
CSN: [id number]     Arrival date & time 03/03/15  [id number] History   First MD Initiated Contact with Patient 03/03/15 1022     Chief Complaint  Patient presents with  . Dizziness  . Cough  . Sore Throat     HPI  Pt here for dizziness, cough, sore throat, hypertension, hyperglycemia. sts issues for over 1 month. sts she hasnt been taking her insulin because she hasn't been eating. sts recently was started back on her BP meds. sts the dizziness has improved but still there Past Medical History  Diagnosis Date  . Hypertension   . Diabetes mellitus without complication   . Thyroid disease    Past Surgical History  Procedure Laterality Date  . Abdominal hysterectomy    . Appendectomy    . Tonsillectomy    . Cholecystectomy    . Hernia repair     History reviewed. No pertinent family history. History  Substance Use Topics  . Smoking status: Current Every Day Smoker  . Smokeless tobacco: Not on file  . Alcohol Use: Yes   OB History    No data available     Review of Systems  All other systems reviewed and are negative  Allergies  Hydrocodone  Home Medications   Prior to Admission medications   Medication Sig Start Date End Date Taking? Authorizing Provider  metoCLOPramide (REGLAN) 10 MG tablet Take 1 tablet (10 mg total) by mouth every 8 (eight) hours as needed for nausea. 03/03/15   Nelva Nayobert Carlisia Geno, MD   BP 150/70 mmHg  Pulse 78  Temp(Src) 97.6 F (36.4 C)  Resp 14  Ht 5\' 2"  (1.575 m)  Wt 114 lb (51.71 kg)  BMI 20.85 kg/m2  SpO2 96% Physical Exam Physical Exam  Nursing note and vitals reviewed. Constitutional: She is oriented to person, place, and time. She appears well-developed and well-nourished. No distress.  HENT:  Head: Normocephalic and atraumatic.  Eyes: Pupils are equal, round, and reactive to light.  Neck: Normal range of motion.  Cardiovascular: Normal rate and intact distal pulses.   Pulmonary/Chest: No respiratory distress.  Abdominal: Normal  appearance. She exhibits no distension.  bowel sounds normal.  No rebound guarding tenderness. Musculoskeletal: Normal range of motion.  Neurological: She is alert and oriented to person, place, and time. No cranial nerve deficit.  Skin: Skin is warm and dry. No rash noted.  Psychiatric: She has a normal mood and affect. Her behavior is normal.   ED Course  Procedures (including critical care time) Medications  sodium chloride 0.9 % bolus 1,000 mL (0 mLs Intravenous Stopped 03/03/15 1204)  metoCLOPramide (REGLAN) injection 10 mg (10 mg Intravenous Given 03/03/15 1046)    Labs Review Labs Reviewed  CBC - Abnormal; Notable for the following:    WBC 10.8 (*)    All other components within normal limits  DIFFERENTIAL - Abnormal; Notable for the following:    Neutro Abs 8.1 (*)    All other components within normal limits  COMPREHENSIVE METABOLIC PANEL - Abnormal; Notable for the following:    Chloride 95 (*)    Glucose, Bld 410 (*)    Creatinine, Ser 1.50 (*)    GFR calc non Af Amer 35 (*)    GFR calc Af Amer 40 (*)    All other components within normal limits  URINALYSIS, ROUTINE W REFLEX MICROSCOPIC - Abnormal; Notable for the following:    Glucose, UA >1000 (*)    Hgb urine dipstick SMALL (*)  Ketones, ur 15 (*)    Protein, ur 100 (*)    All other components within normal limits  URINE MICROSCOPIC-ADD ON - Abnormal; Notable for the following:    Squamous Epithelial / LPF FEW (*)    Bacteria, UA FEW (*)    All other components within normal limits  CBG MONITORING, ED - Abnormal; Notable for the following:    Glucose-Capillary 384 (*)    All other components within normal limits  CBG MONITORING, ED - Abnormal; Notable for the following:    Glucose-Capillary 240 (*)    All other components within normal limits  I-STAT TROPOININ, ED    Imaging Review No results found.   EKG Interpretation None     After treatment in the ED the patient feels back to baseline and wants  to go home. MDM   Final diagnoses:  Hyperglycemia  Gastroparesis        Nelva Nay, MD 03/03/15 [phone]

## 2015-03-03 NOTE — ED Notes (Signed)
Pt here for dizziness, cough, sore throat, hypertension, hyperglycemia. sts issues for over 1 month. sts she hasnt been taking her insulin because she hasn't been eating. sts recently was started back on her BP meds. sts the dizziness has improved but still there.

## 2015-03-08 DIAGNOSIS — E784 Other hyperlipidemia: Secondary | ICD-10-CM | POA: Diagnosis not present

## 2015-03-08 DIAGNOSIS — E039 Hypothyroidism, unspecified: Secondary | ICD-10-CM | POA: Diagnosis not present

## 2015-03-08 DIAGNOSIS — I10 Essential (primary) hypertension: Secondary | ICD-10-CM | POA: Diagnosis not present

## 2015-03-08 DIAGNOSIS — E1165 Type 2 diabetes mellitus with hyperglycemia: Secondary | ICD-10-CM | POA: Diagnosis not present

## 2015-03-08 DIAGNOSIS — K219 Gastro-esophageal reflux disease without esophagitis: Secondary | ICD-10-CM | POA: Diagnosis not present

## 2015-03-20 DIAGNOSIS — H4011X4 Primary open-angle glaucoma, indeterminate stage: Secondary | ICD-10-CM | POA: Diagnosis not present

## 2015-04-05 DIAGNOSIS — I10 Essential (primary) hypertension: Secondary | ICD-10-CM | POA: Diagnosis not present

## 2015-04-05 DIAGNOSIS — K219 Gastro-esophageal reflux disease without esophagitis: Secondary | ICD-10-CM | POA: Diagnosis not present

## 2015-04-05 DIAGNOSIS — E039 Hypothyroidism, unspecified: Secondary | ICD-10-CM | POA: Diagnosis not present

## 2015-04-05 DIAGNOSIS — R42 Dizziness and giddiness: Secondary | ICD-10-CM | POA: Diagnosis not present

## 2015-04-05 DIAGNOSIS — E1165 Type 2 diabetes mellitus with hyperglycemia: Secondary | ICD-10-CM | POA: Diagnosis not present

## 2015-04-05 DIAGNOSIS — E784 Other hyperlipidemia: Secondary | ICD-10-CM | POA: Diagnosis not present

## 2015-04-07 NOTE — Consult Note (Signed)
PATIENT NAME:  Cassandra [name], Cassandra [name] A MR#:  [id number] DATE OF BIRTH:  April 01, 1946  DATE OF CONSULTATION:  10/11/2013  REFERRING PHYSICIAN:   Gladstone Lighter, MD  CONSULTING PHYSICIAN:  A. Lavone Orn, MD  CHIEF COMPLAINT: Uncontrolled diabetes.   HISTORY OF PRESENT ILLNESS: This is a 69 year old female with a history of hypothyroidism and type 1 diabetes who was admitted on 24 August after an episode of syncope in the setting of alcohol intoxication. Notes and records were reviewed. Throughout her hospital stay, blood sugars have been managed on a regimen of Levemir 10 units each morning and NovoLog insulin sliding scale of 2 units per sugar of 50 over a target of 150. Sugars have been fairly variable and in the last 24 hours have ranged from 44-355. She tends to have high fasting sugars in the 270-330 range with lower sugars as the day progresses due to doses of her sliding scale insulin. This afternoon, blood sugar dropped to 44 after she was given a total of 16 units of NovoLog today.   She was previously managed as an outpatient by Dr. Kem Kays at [facility]. After Dr. Sabra Heck left the internal medicine department a few months ago, she has not had any endocrine followup. She explains she faithfully takes her Lantus daily and again takes a Humalog at home only if her blood sugar is high; typically, if sugar is over 200 she will take 2 units based on sliding scale instructions.   She also has a history of primary hypothyroidism. Her last TSH level on October 23 was normal at 0.9 uIU /mL. Prior to this hospitalization, she had been taking levothyroxine 100 mcg per day. At 2 clinic visits at [facility] earlier this year, her TSH had been normal again while she had been on the 100 mcg per day. Since admission, she has been maintained on levothyroxine 137 mcg per day.   PAST MEDICAL HISTORY:  1.  Type 1 diabetes mellitus.  2.  Hypothyroidism.  3.  Alcohol abuse/dependence.  4.  Bipolar  disorder.  5.  GERD. 6.  Hypertension.  7.  Psoriasis.  8.  Tobacco dependence.  9.  Cataracts.     PAST SURGICAL HISTORY:  1.  Hysterectomy.  2.  Oophorectomy.  3.  Tonsillectomy.  4.  Appendectomy.  5.  Cholecystectomy.  6.  Cervical disk surgery.  7.  Bladder surgery.   SOCIAL HISTORY: The patient smokes 3-4 cigarettes per day. She lives with her husband. She is unemployed; however, he is employed. She drinks alcohol, typically 2 drinks per day.   FAMILY HISTORY: No known diabetes.   ALLERGIES: PENICILLIN AND SULFA MEDICATIONS.   CURRENT INPATIENT MEDICATIONS:  1.  NovoLog insulin sliding scale.  2.  Levemir 10 units q. day. 3.  Astelin nasal spray 1 spray b.i.d.  4.  B12 1000 mcg daily.  5.  Colace 100 mg b.i.d.  6.  Docusate senna 50/8.6, two tabs daily.  7.  Folic acid 1 mg once daily.  8.  Levothyroxine 137 mcg daily.  9.  Lopressor 12.5 mg b.i.d.  10.  Multivitamin 1 tab daily.  11.  Pantoprazole 40 mg daily.  12.  Travatan eye drops 1 drop both eyes at bedtime.   REVIEW OF SYSTEMS:  GENERAL: Denies weight loss. Denies fever.  HEENT: Denies blurred vision. Denies sore throat.  NECK: Denies neck pain. Denies dysphagia.  CARDIAC: Denies chest pain. Denies palpitation.  PULMONARY: Denies cough. Denies shortness of breath.  ABDOMEN: She  reports nausea. She reports early satiety.  EXTREMITIES: Denies leg swelling.  SKIN: Denies rash. Denies other skin changes including pruritus.  ENDOCRINE: Denies heat or cold intolerance.  GENITOURINARY: Denies dysuria or hematuria.   PHYSICAL EXAMINATION:  VITAL SIGNS: Height 61.9 inches, weight 113 pounds. BMI is 20.8. Temperature 98.8, pulse 78, respirations 20, blood pressure 150/80.  GENERAL: Thin white female in no acute distress.  HEENT: There are ecchymotic lesions to both eyes and a laceration on the left frontal scalp as well as the right eyebrow which has been sutured.  OROPHARYNX: Clear.  NECK: Supple. No  thyromegaly.  CARDIAC: Regular rate and rhythm. No audible murmur. No carotid bruit.  LUNGS: Clear to auscultation bilaterally. No wheeze.  ABDOMEN: Diffusely soft, nontender, nondistended.  EXTREMITIES: No edema is present.  SKIN: No rash.  NEUROLOGIC: No tremor is present.   LABORATORY DATA: October 25, glucose 176, BUN 6, creatinine 0.74, sodium 140, chloride 109, EGFR greater than 60, calcium 8.0. Hematocrit 21.4%.   ASSESSMENT:  1.  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  2.  Diabetic retinopathy status post laser eye surgery.  3.  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  4.  Tobacco dependence.  5.  Hypothyroidism.  6.  Anemia.   RECOMMENDATIONS:  1.  I recommend continuing Lantus as an outpatient. Ten units each morning should be reasonable.  2.  Recommend she start a standing dose of Humalog 2 units before meals to cover her carbohydrate intake.  3.  Okay to continue the Humalog insulin sliding scale before meals to target a sugar of 200.  4.  Recommend checking blood sugars 4-6 times daily. Asked her to bring her glucometer to her next clinic visit.  5.  Reduce dose of levothyroxine to 100 mcg daily as she has been euthyroid on this dose for quite some time.  6.  Can consider workup for diabetic gastroparesis as an outpatient. Did suggest to her some dietary changes which may help, such as addition of Glucerna as either a meal supplement or a meal substitute.  7.  She would benefit from tobacco cessation.  8.  She will follow up with hematologist as an outpatient for anemia.   Thank you for the kind request for consultation. I would like to see patient in clinic in about 2  weeks.    ____________________________ A. Lavone Orn, MD ams:np D: 10/11/2013 17:19:20 ET T: 10/11/2013 19:48:36 ET JOB#: [id number]  cc: A. Lavone Orn, MD, <Dictator> Sherlon Handing MD ELECTRONICALLY SIGNED 10/27/2013 14:05

## 2015-04-07 NOTE — Consult Note (Signed)
PATIENT NAME:  Cassandra Cassandra [name], Cassandra Cassandra [name] MR#:  [id number] DATE OF BIRTH:  Apr 09, 1946  DATE OF CONSULTATION:  10/08/2013  CONSULTING PHYSICIAN:  Audery AmelJohn T. Shantel Helwig, MD  IDENTIFYING INFORMATION AND REASON FOR CONSULTATION: Cassandra Cassandra [name] with Cassandra Cassandra [name] and mood problems who was admitted to the hospital intoxicated. Consultation for alcohol [name].   HISTORY OF PRESENT ILLNESS: Information obtained from the patient and the chart. The patient had an alcohol level of 239 when she came in. Husband reported that she had been drinking heavily on Cassandra [name] daily basis. The patient herself gives Cassandra [name] mixed history. On the one hand, she tries to minimize symptoms saying that she does not drink every single day and that she will often drink no more than 1-1/2 drinks Cassandra [name] day. On another occasion, however, she admits that she is Cassandra [name] "closet drinker" and that she hides her alcohol consumption and she passively admits that she has been drinking more and more recently. She claims that her drinking is Cassandra [name] result of the life stresses that she is under. She feels that her family and other people she knows are constantly pestering her with their complaints. The patient also tends to blame all of her problems on some medical issues, specifically stating that the reason she gets into trouble physically is because of her blood pressure and not because of her drinking. She denies that she is abusing any other drugs. She denies any suicidal or homicidal ideation. Denies depressed mood.   PAST PSYCHIATRIC HISTORY: Multiple admissions in the past for conditions related to alcohol [name]. It is not clear whether she has ever had Cassandra [name] full-blown seizure or DTs in the past, but probably not. She has been referred to the intensive outpatient program in the past and tells me she specifically found that helpful. At one point, she was described as having bipolar disorder of the type II variety and had been prescribed various psychiatric medicines, but  she does not think any of them were helpful. She has Cassandra [name] history of having overdosed years ago, but minimizes that as well.   SOCIAL HISTORY: Married, lives with her husband. Husband evidently also drinks at least Cassandra [name] little bit. She has adult children. She has some other family she is in occasional contact with.   PAST MEDICAL HISTORY: High blood pressure, hypothyroidism.   SUBSTANCE [name] HISTORY: Detailed above, history of alcohol [name].   FAMILY HISTORY: Positive for alcohol [name].   REVIEW OF SYSTEMS: Feeling tired and Cassandra [name] little sick to her stomach, achy all over, especially where she fell and hit her face. Denies suicidal ideation. Denies depressed mood. Denies any psychotic symptoms.   CURRENT MEDICATIONS: Here in the hospital, she is getting levothyroxine 137 mcg per day, magnesium 400 mg Cassandra [name] day, metoprolol 12.5 mg twice Cassandra [name] day, pantoprazole 40 mg in the morning and sliding scale insulin and detox medicine.   ALLERGIES: CODEINE, HYDROCODONE, PENICILLIN, SULFA.   MENTAL STATUS EXAMINATION: Cassandra [name] somewhat disheveled [name], looks her stated age, cooperative with the interview. Eye contact good. Psychomotor activity normal. Speech normal rate, tone and volume. Affect mildly anxious but reactive. Mood stated as okay. Thoughts are lucid without any loosening of associations or delusions. Denies auditory or visual hallucinations. Denies suicidal or homicidal ideation. Shows adequate insight and judgment, some impairment around her substance [name]. Normal intelligence. Alert and oriented x 4.   LABORATORY RESULTS: Alcohol level 239 on admission. Several other abnormalities consistent with alcohol toxicity, including low protein and albumin and anemia.  ASSESSMENT: This is Cassandra [name] 69 year old [name] with alcohol dependence. Tends to minimize her drinking. At some moments, however, she will admit that it is Cassandra [name] problem and when asked directly says that it would be Cassandra [name] good idea to stop drinking. She was open to  counseling about the medical and psychological problems from continued drinking. The patient should be continued on the CIWA protocol currently in place. Additionally, she should be monitored for any signs of worsening delirium. I have suggested to her that we refer her to the intensive outpatient program here with Dr. Maisie Fus. She had previously attended that and thought it was helpful. She is agreeable to that. I am not sure if we will be able to get that done on Cassandra [name] Friday afternoon, but I put the order in. If she is still here after the weekend, hopefully he can see her. If not, perhaps social work on the ward can make sure the recommendation gets passed along.   DIAGNOSIS, PRINCIPAL AND PRIMARY:  AXIS I: Alcohol dependence.   SECONDARY DIAGNOSES: AXIS I: Substance-induced mood disorder.  AXIS II: Deferred.  AXIS III: Recent fall, hypothyroidism, hypertension, alcohol withdrawal.  AXIS IV: Moderate, chronic social stress.  AXIS V: Functioning at time of evaluation 40.    ____________________________ Audery Amel, MD jtc:jm D: 10/08/2013 18:37:06 ET T: 10/08/2013 20:19:16 ET JOB#: [id number]  cc: Audery Amel, MD, <Dictator> Audery Amel MD ELECTRONICALLY SIGNED 10/08/2013 22:39

## 2015-04-07 NOTE — H&P (Signed)
PATIENT NAME:  Cassandra [name], LOFLAND MR#:  [id number] DATE OF BIRTH:  30-Jan-1946  DATE OF ADMISSION:  10/07/2013  REFERRING PHYSICIAN:  Suella Broad, M.D.    PRIMARY CARE PHYSICIAN: Following at [facility].   PRIMARY CARDIOLOGIST: Arnoldo Hooker, M.D.   ENDOCRINOLOGY:  Silver Huguenin, M.D.   CHIEF COMPLAINT: Syncope.   HISTORY OF PRESENT ILLNESS: This is a 69 year old female with significant past medical history of diabetes, hypertension, hypothyroidism, presents with syncope. The patient reports she has been drinking alcohol, had three cups of Vodka this evening and reports she had an episode of passing out. The patient cannot recall details, was brought by her husband, who called EMS. He was intoxicated as well. The patient, initially, reports she has been drinking only on the weekends, but the nurse reports she has been told by the husband she is drinking almost on a daily basis. The patient was initially hypotensive upon presentation with systolic blood pressure in the 70s, responded to fluids, but then she became hypotensive again after receiving morphine, whereas well she responded again to fluid. The patient had head lacerations where it was sutured. The patient had CT head and cervical spine which did not show any acute findings or fracture. The patient reports she has been worked up by Dr. Gwen Pounds for presyncope where she has been on Holter monitor which she took off yesterday, she reports he has been having episodes of presyncope over the last few months, where she had some of her high blood pressure medication stopped in the past. The patient denies any chest pain, any shortness of breath, any confusion or altered mental status. The patient's troponin was negative. EKG did not show any changes. The patient had significant hypokalemia at 2.9. Hospitalist service was requested to admit the patient for further evaluation of her syncope.   PAST MEDICAL HISTORY: 1.  Diabetes mellitus.  2.   Bipolar disorder.  3.  Hypothyroidism:  4.  GERD.  5.  Hypertension.  6.  Psoriasis.    PAST SURGICAL HISTORY: 1.Hysterectomy  2. Oophorectomy. 3. Tonsillectomy.  4. Appendectomy.  5. Cholecystectomy.  6. Cervical disk surgery.  7  Bladder urgency for urgency.   SOCIAL HISTORY: She smokes 1/2 pack per day. Drinks alcohol occasionally;  she reports over the weekend, but, as mentioned earlier, husband told the ED nurse she almost drinks on a nightly basis.   FAMILY HISTORY: Mother died of colon cancer. Father had coronary artery disease of unknown age.    ALLERGIES:  PENICILLIN AND SULFA.   HOME MEDICATIONS: 1.  Aspirin 81 mg daily.  2.  Calcium with vitamin D 1 tablet daily.  3.  Hydrocortisone cream as needed.  4.  Cozaar 100 mg oral daily.  5.  Elmiron 1 capsule 100 mg oral twice a day.  6.  Lantus 10 units subcutaneous every morning.  7.  Reglan as needed.  8.  Prevacid 30 mg daily.  9.  Humalog sliding scale.  10.  Maalox as needed.  11.  Synthroid 100 mcg oral daily.  12.  Norvasc 5 mg oral daily.  13.  Hydrochlorothiazide 25 mg oral daily.   REVIEW OF SYSTEMS:  CONSTITUTIONAL: Denies fever, but complains of fatigue, weakness. Denies weight gain, weight loss.   EYES: Denies blurry vision, double vision, inflammation, glaucoma.  ENT: Denies tinnitus, ear pain, hearing loss, epistaxis or discharge.  RESPIRATORY: Denies cough, wheezing, hemoptysis, dyspnea.  CARDIOVASCULAR: Denies chest pain, edema, arrhythmia, palpitation. Had episode of syncope.  GASTROINTESTINAL: Denies  nausea, vomiting, diarrhea, abdominal pain, hematemesis or rectal bleed.  GENITOURINARY: Denies dysuria, hematuria,  renal colic.  ENDOCRINE: Denies polyuria, polydipsia, heat or cold intolerance.  HEMATOLOGY: Denies anemia, easy bruising or bleeding diathesis. INTEGUMENTARY: Denies acne, rash or skin lesions, has psoriasis.  MUSCULOSKELETAL: Denies any gout, cramps or arthritis.  NEUROLOGIC:  Denies CVA, TIA, ataxia, dementia, headache, had episode of syncope.  PSYCHIATRIC: Denies anxiety, insomnia. Has history of bipolar disorder. Denies any alcohol or substance abuse.   PHYSICAL EXAMINATION: VITAL SIGNS: Temperature 98.3, pulse 102, respiratory rate 18, blood pressure 107/51, saturating 98% on room air.  GENERAL: An elderly female who appears comfortable, in no apparent distress. Has traumatic head with blood in her hair with forehead laceration sutured, as well as right eyebrow laceration with a bruise of the right eye. HEENT: Pupils equal, reactive to light. Pink conjunctivae. Anicteric sclerae. Moist oral mucosa.  NECK: Supple. No thyromegaly. No JVD.  CHEST: Good air entry bilaterally. No wheezing, rales or rhonchi.  CARDIOVASCULAR: S1, S2 heard. No rubs, murmurs or gallops.   ABDOMEN: Soft, nontender, nondistended. Bowel sounds present.  EXTREMITIES: No edema. No clubbing. No cyanosis. Pedal pulses felt bilaterally.  PSYCHIATRIC: Appropriate affect. Awake, alert x 3. Intact judgment and insight.  NEUROLOGIC: Grossly intact. Motor 5 out of 5. No focal deficits.  PSYCHIATRIC: Appropriate affect. Awake, alert x 3. Intact judgment and insight.  SKIN: Has psoriatic lesions on lower extremity.  LYMPHATICS: No cervical or supraclavicular lymphadenopathy.   PERTINENT LABORATORY DATA: Glucose 253, BUN 21, creatinine 1.23, sodium 136, potassium 2.9, chloride 102, CO2 21, alcohol 0.1239, total protein 5.8, albumin 2.7, total bilirubin 0.2, ALT 19, AST 28 . Troponin less than 0.02. White blood cell 8.6, hemoglobin 11.3, hematocrit 32.3, platelet 230. EKG showing normal sinus rhythm without significant ST or T wave changes from previous EKG.   IMAGING STUDIES: CT cervical spine showing no acute cervical spine fracture. CT head showing no acute intracranial process with right supraorbital scalp laceration and left superior frontal scalp contusion with probable laceration.   ASSESSMENT AND  PLAN: 1.  Syncope. I would usually relate this episode to her alcohol intoxication, but given the fact that the patient had presyncope in the past, has been actively worked by her cardiologist for presyncope, so would investigate this further, so the patient will be admitted to telemetry unit. We will continue to hold her hypertensive medications, especially as she was hypotensive. Will continue to cycle her cardiac enzymes. Have her on telemetry monitor. We will check carotid Doppler's. We will check 2-D echo. We will consult cardiology, Dr. Gwen PoundsKowalski, as well will correct her hypokalemia as it might be contributing to her syncope and will check magnesium level and replace if needed.  2.  History of alcohol abuse. Was started on CIWA protocol.  3.  Hypokalemia. We will replace. We will recheck in a.m.  4.  Diabetes mellitus. We will start on insulin sliding scale and if she is stable, eating well, we will resume her back on Lantus.  5.  Hypertension: Will hold her hold meds, especially as she is hypotensive.  6.  Hypothyroidism. Continue with Synthroid.  7.  Depression. Continue with home meds.  8.  Deep vein thrombosis prophylaxis. We will have her on sequential compression device. We will hold on subcutaneous heparin, especially due to the fact she had significant head laceration and I do not want her to bleed any further.  If she remains in the hospital for a long period of time,  we will start her on chemical anticoagulation. Meanwhile, she will be kept on sequential compression device.  9.  CODE STATUS: Full code.   Total time spent on admission and patient care: 55 minutes.    ____________________________ Starleen Arms, MD dse:NTS D: 10/07/2013 04:36:37 ET T: 10/07/2013 05:14:05 ET JOB#: [id number]  cc: Starleen Arms, MD, <Dictator> Linzie Criss Teena Irani MD ELECTRONICALLY SIGNED 10/08/2013 3:06

## 2015-04-07 NOTE — Discharge Summary (Signed)
PATIENT NAME:  Cassandra [name], Cassandra [name] A MR#:  [id number] DATE OF BIRTH:  1946/06/22  DATE OF ADMISSION:  10/07/2013 DATE OF DISCHARGE:  10/11/2013  ADMITTING PHYSICIAN: Starleen Arms, MD  DISCHARGING PHYSICIAN: Enid Baas, MD   PRIMARY CARE PHYSICIAN: At [facility].    PRIMARY CARDIOLOGIST: Lamar Blinks, MD  CONSULTATIONS IN THE HOSPITAL:  1. Endocrinology consultation by Dr. Tedd Sias.  2.  Cardiology consultation by Dr. Gwen Pounds.  3.  Psychiatric consultation with Dr. Toni Amend.    DISCHARGE DIAGNOSES: 1.  Syncope.  2.  Hypertension.  3.  Acute anemia, likely from alcohol-induced bone marrow suppression, has not required any transfusion this admission.  4.  Severe alcohol abuse.  5.  Hypothyroidism.  6.  Type 1 diabetes mellitus with hyperglycemia.  7.  Constipation.  8.  Alcohol intoxication on admission.   DISCHARGE HOME MEDICATIONS:  1.  Lantus 10 units subcu in the morning.  2.  Humalog sliding scale insulin.  3.  Humalog 2 units subcutaneously 3 times a day before meals.  4.  Prilosec 20 mg p.o. daily.  5.  Metoprolol 12.5 mg p.o. b.i.d.  6.  Cyanocobalamin 1000 mcg p.o. daily.  7.  Synthroid 100 mcg p.o. daily.   DISCHARGE DIET: Low-sodium, ADA, 1800 calorie diet.   DISCHARGE ACTIVITY: As tolerated.    FOLLOWUP INSTRUCTIONS: 1.  Follow up with PCP in 1 week.  2.  Follow up with Dr. Orlie Dakin in 1 week for anemia.  3.  Follow up with Dr. Tedd Sias in 1 to 2 weeks.  4.  Cardiology followup in 2 to 3 weeks.  5.  Advised to quit alcohol.   6.  The patient advised to follow up in the intensive outpatient program for alcohol abuse which the patient refused at the time of discharge.   LABS AND IMAGING STUDIES PRIOR TO DISCHARGE:  1.  WBC 7.7, hemoglobin 7.7, hematocrit 21.7, platelet count 181, repeat hemoglobin prior to discharge was 8.2.  2.  Sodium 140, potassium 4.2, chloride 109, bicarb 25, BUN 6, creatinine 0.74, glucose 176, calcium of 8.0.  3.  Serum  iron 127, iron saturation 36%, iron binding capacity 355, ferritin 34, retic count 2.8, absolute retic count 0.089, vitamin B12 at 310 pg/mL, LDH 225, haptoglobin 121.  4.  INR was 0.9.  5.  Troponins were negative.  6.  Ultrasound Dopplers carotid showing no hemodynamically significant stenosis.  7.  Echo Doppler revealing normal LV ejection fraction 60% to 65%, mild mitral valve regurgitation.  8.  Urinalysis negative for an infection.  9.  CT head showing mild atrophy, left frontal scalp hematoma, no acute intracranial abnormality.  10.  CT C-spine showing degenerative postsurgical changes in the supine, posttraumatic changes in the right clavicle are not excluded.  11.  Alcohol level on admission was 239.   BRIEF HOSPITAL COURSE: Cassandra [name] is a 69 year old Caucasian female with past medical history significant for hypothyroidism, hypertension, alcohol abuse, presents to the hospital after she had a syncopal episode. She has been following recently with Dr. Gwen Pounds secondary to presyncopal episodes at home. She was on a Holter monitor, the recordings of which were found to be normal.  1. Syncope. The patient was also found to be intoxicated with alcohol at the time of presentation. She also was noticed to have acute anemia a couple of days into hospitalization. Not sure if this anemia has been causing her presyncopal episode or if she passed out due to her alcohol intoxication. She has not  had further episodes of syncope while in the hospital. No arrhythmias were noted on the monitor and,  as mentioned above, she was just taken off of a Holter monitor which was normal as well.  Her heart rate was normal sinus rhythm to sinus tach up to 110. She was started on very low-dose metoprolol and she tolerated it well in the hospital. Also, she was on blood pressure medications at home and her blood pressure was in the normal range without the medications as she is not being discharged on any of the blood  pressure medications, not sure if they caused hypotension. She was also noted to be hypotensive when she came into the hospital.  2.  Acute anemia. The patient's hemoglobin was 11 on presentation. She had a fall and had bleed from scalp hematomas at presentation. Hemoglobin done at PCPs office on 09/23/2013 was 14. Her past hemoglobins have been in the range of 10 to 13. Her hemoglobin after giving her fluids dropped down to as low as 7.6 while in the hospital. She had an EGD and colonoscopy done in the last 2 years at [facility] and the reports of which showed mild gastritis and internal hemorrhoids but no significant causes for her anemia. Anemia labs reveal normal iron levels, low-normal B12 which was being replaced even as an outpatient. 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 Dr. Orlie DakinFinnegan was consulted and he also thinks that her anemia could be from alcohol-induced acute bone marrow suppression and since her hemoglobin has remained stable, greater than 7, no transfusion was given and she will be following up with Dr. Orlie DakinFinnegan in the office as an outpatient in 1 week.  3.  Diabetes mellitus, type 1 diabetes with hyperglycemic and hypoglycemic episodes while in the hospital. She was seen by Dr. Tedd SiasSolum. She was following with Dr. Tedd SiasSolum in the past. Dr. Tedd SiasSolum adjusted her insulin. She will be discharged back on the same 10 units of Lantus in the morning and Humalog before meals was added along with sliding scale. She was advised to follow up with Dr. Tedd SiasSolum.  4.  Alcohol abuse and intoxication. Seen by Dr. Toni Amendlapacs in the hospital, refused inpatient detox program and initially agreed for intensive outpatient program. However, at the time of discharge, when Dr. Maisie Fushomas from intensive outpatient alcohol rehab program came to see the patient, she has refused and decided to quit on her own.   5.  Hypothyroidism. Her Synthroid was continued.   Her  course has been otherwise uneventful in the hospital.  She has ambulated well, no home physical therapy or nursing are being ordered at this time and the patient did not want to be followed by home health anyway.    DISCHARGE CONDITION: Stable.   DISCHARGE DISPOSITION: Home.   TIME SPENT ON DISCHARGE: 45 minutes.   ____________________________ Enid Baasadhika Sheldon Amara, MD rk:cs D: 10/12/2013 16:05:00 ET T: 10/12/2013 18:37:42 ET JOB#: [id number]  cc: Enid Baasadhika Philbert Ocallaghan, MD, <Dictator> Steele SizerMark A. Crissman, MD  Enid BaasADHIKA Brennyn Haisley MD ELECTRONICALLY SIGNED 10/26/2013 13:53

## 2015-04-07 NOTE — Consult Note (Signed)
Chief Complaint and History:  Referring Physician Dr. Nemiah CommanderKalisetti   Chief Complaint uncontrolled type 1 diabetes   Allergies:  Codeine: Other  PCN: Other  Sulfa drugs: Unknown  Hydrocodone: Rash  Tramadol: N/V/Diarrhea  Assessment/Plan:  Assessment/Plan 69 yo F with DM1, hypothyroidism, admitted after episode of syncope while intoxicated with alcohol. Has been admitted since 10/23. Seen in consult for diabetes. She was previously a patient of Dr. Silver HugueninAileen Miller, who left the IM dept a few months ago. She has not had recent Endocrine follow-up. Her last hgb A1c was 8.2% in 01/2013. She takes Lantus 10 units each morning and Humalog only if BG >200, qAC. She reports wide swings in blood sugars; she is having highs and lows. Today she received Levemir 10 units + a total of NovoLog 14 units (based on SSI) and at 4:30pm she had a low in the 30s with symptoms. Second issue is hypothyroidism. She was taking LT4 100 mcg daily prior to admission, however dose inc'd to 137 mcg during hospitalization. TSH on 10/23 was 0.9 uIU/ml. [facility].  A/ Type 1 diabetes, uncontrolled Hypothyroidism  P/ 1. Add Humalog 2 units tid qAC 2. Discussed addition of supplements such as Glucerna 3. Continue Humalog SSI and Lantus 10 units qAM 4. Reduce dose of levothyroxine to 100 mcg daily.  I will schedule a clinic f/u in 2 weeks. Full consult to be dictated.   Case Discussed With patient   Electronic Signatures: Raj JanusSolum, Kiira Brach M (MD)  (Signed 27-Oct-14 17:10)  Authored: Chief Complaint and History, ALLERGIES, Assessment/Plan   Last Updated: 27-Oct-14 17:10 by Raj JanusSolum, Sebastiana Wuest M (MD)

## 2015-04-07 NOTE — Consult Note (Signed)
Consult: treatment recommendations Patient was seen as requested by attending MD to explore option of abstinence of all alcoholic beverages thru the assistance of the [facility]. Patient self reported that she was a patient in the [facility] in the past and groups were not helpful and also that she does not have a "drinking problem." She was encouraged to seek treatment to address the issue of consequences of consumption of alcoholic beverage to include this hospitalization despite her verbalization of not having an alcoholic beverage problem. She was seen as a prime candidate for substance abuse treatment. She was encouraged to participate in [facility] at this inpatient discharge, however she expressed plans to not follow up in CD-IOP at this inpatient discharge..   with and reviewed my finding with her assigned Clinical Child psychotherapistocial Worker.     Electronic Signatures: Huel Cotehomas, Richard (PsyD)  (Signed on 27-Oct-14 16:14)  Authored  Last Updated: 27-Oct-14 16:14 by Huel Cotehomas, Richard (PsyD)

## 2015-04-07 NOTE — Op Note (Signed)
PATIENT NAME:  Cassandra JerseyMCFAYDEN, [name] MR#:  [id number] DATE OF BIRTH:  12/20/1945  DATE OF PROCEDURE:  12/23/2012  LOCATION:  [facility]  PREOPERATIVE DIAGNOSIS:  Senile cataract left eye.  POSTOPERATIVE DIAGNOSIS:  Senile cataract left eye.  PROCEDURE:  Phacoemulsification with posterior chamber intraocular lens implantation of the left eye.  LENS:  ZCB00 22.5 diopter posterior chamber intraocular lens.  ULTRASOUND TIME: 14 % of 1 minute, 38 seconds. CDE 14.2.  SURGEON:  Deirdre Evenerhadwick R. Jourdyn Ferrin, MD  ANESTHESIA:  Topical with tetracaine drops and 2% Xylocaine jelly.  COMPLICATIONS:  None.  DESCRIPTION OF PROCEDURE:  The patient was identified in the holding room and transported to the operating room and placed in the supine position under the operating microscope.  The left eye was identified as the operative eye and it was prepped and draped in the usual sterile ophthalmic fashion.  A 1 millimeter clear-corneal paracentesis was made at the 1:30 position.  The anterior chamber was filled with Viscoat viscoelastic.  A 2.4 millimeter keratome was used to make a near-clear corneal incision at the 10:30 position.  A curvilinear capsulorrhexis was made with a cystotome and capsulorrhexis forceps.  Balanced salt solution was used to hydrodissect and hydrodelineate the nucleus.  Phacoemulsification was then used in stop and chop fashion to remove the lens nucleus and epinucleus.  The remaining cortex was then removed using the irrigation and aspiration handpiece. Provisc was then placed into the capsular bag to distend it for lens placement.  A ZCB00 22.5 diopter lens was then injected into the capsular bag.  The remaining viscoelastic was aspirated.  Wounds were hydrated with balanced salt solution.  The anterior chamber was inflated to a physiologic pressure with balanced salt solution.  No wound leaks were noted.  0.1 mL of cefuroxime 10 mg/mL were injected into the anterior chamber for a  dose of 1 mg of intracameral antibiotic at the completion of the case.  Combigan drops were applied to the eye. The patient was taken to the recovery room in stable condition without complications of anesthesia or surgery.   ____________________________ Deirdre Evenerhadwick R. Hagan Vanauken, MD crb:jm D: 12/23/2012 15:23:58 ET T: 12/23/2012 18:25:11 ET JOB#: [id number]  cc: Deirdre Evenerhadwick R. Lonie Newsham, MD, <Dictator> Lockie MolaHADWICK Kahley Leib MD ELECTRONICALLY SIGNED 12/30/2012 9:53

## 2015-04-07 NOTE — Consult Note (Signed)
General Aspect This is a 69 yo female that presented with syncope, fall and head trauma.  Patient had been seen by Dr. Nehemiah Massed 10/04/13 at which time she was complaining of presyncopal episodes and a syncopal episode about one to 2 months ago at which time EMS checked her out at home but she did not present to the ER.  She's been having some issues with blood pressure that have been fluctuating, currently is on HCZT, losartan and amlodipine was added within the last several weeks.  She has seen more lower extreme edema with the addition of amlodipine.  Prior to amlodipine she start on beta blocker which she did not tolerate due to extreme fatigue and sleeping 12-14 hours a day. Patient was also found to have an alcohol level of 0.239 and had drank 3 glasses of vodka Prior to fall. The husband reports she got up to go to bed around 7:30 and was very wobbly and he heard her hit the floor in the bedroom.  She did sustain trauma to her face has several stitches that have been placed.  CT is pending as well as echo.  Work up in this office when she was seen on the 20th included Holter monitor which the patient had not turned in yet, but the husband will bring up by the office today, carotid ultrasound and stress test.This stress test will probably be done on an outpatient basis since the patient had nausea and vomiting and headache probably secondary to the head trauma this a.m and doesn't think she's up for stress test.  It is scheduled in our office October 31.  Of note she also had ST changes inferior leads of her EKG on admission with a long QT of 0.531 ms..Cardiac enzymes have been negative.Patient reports her last syncopal episode and fall also occurred about the same time a day after drinking alcohol.She was checked out by EMS but did not present to the hospital.Cardiac enzymes are  negative.  Patient was found to have a potassium level of 2.9, magnesium of 1.7 which is being replaced.Blood pressure  medications are on hold due to syncope and hypotension   Physical Exam:  GEN no acute distress   HEENT Trauma to face with stitches and a black eye   RESP normal resp effort  clear BS   CARD Regular rate and rhythm  Normal, S1, S2  No murmur   ABD denies tenderness  soft   EXTR negative edema   SKIN normal to palpation, skin turgor decreased   NEURO cranial nerves intact, motor/sensory function intact   PSYCH alert, A+O to time, place, person   Review of Systems:  Subjective/Chief Complaint Syncope   General: Weakness   ENT: Head trauma, headache   Respiratory: No Complaints   Cardiovascular: Syncope   Gastrointestinal: No Complaints   Genitourinary: No Complaints   Vascular: No Complaints   Musculoskeletal: No Complaints   Neurologic: Fainting   Hematologic: No Complaints   Endocrine: No Complaints   Psychiatric: No Complaints   Review of Systems: All other systems were reviewed and found to be negative   Medications/Allergies Reviewed Medications/Allergies reviewed   Lab Results:  Thyroid:  23-Oct-14 08:32   Thyroid Stimulating Hormone 0.902 (0.45-4.50 (International Unit)  ----------------------- Pregnant patients have  different reference  ranges for TSH:  - - - - - - - - - -  Pregnant, first trimetser:  0.36 - 2.50 uIU/mL)  LabObservation:  23-Oct-14 10:24   OBSERVATION Reason for Test  Routine Chem:  23-Oct-14 08:32   Hemoglobin A1c ([facility])  7.5 (The American Diabetes Association recommends that a primary goal of therapy should be <7% and that physicians should reevaluate the treatment regimen in patients with HbA1c values consistently >8%.)  Glucose, Serum  225  BUN 16  Creatinine (comp) 1.07  Sodium, Serum 142  Potassium, Serum 4.4  Chloride, Serum  111  CO2, Serum 23  Calcium (Total), Serum  7.7  Anion Gap 8  Osmolality (calc) 291  eGFR (African American) >60  eGFR (Non-African American)  54 (eGFR values <27m/min/1.73 m2  may be an indication of chronic kidney disease (CKD). Calculated eGFR is useful in patients with stable renal function. The eGFR calculation will not be reliable in acutely ill patients when serum creatinine is changing rapidly. It is not useful in  patients on dialysis. The eGFR calculation may not be applicable to patients at the low and high extremes of body sizes, pregnant women, and vegetarians.)  Cardiac:  23-Oct-14 08:32   CK, Total 78  CPK-MB, Serum 1.8 (Result(s) reported on 07 Oct 2013 at 09:21AM.)  Troponin I < 0.02 (0.00-0.05 0.05 ng/mL or less: NEGATIVE  Repeat testing in 3-6 hrs  if clinically indicated. >0.05 ng/mL: POTENTIAL  MYOCARDIAL INJURY. Repeat  testing in 3-6 hrs if  clinically indicated. NOTE: An increase or decrease  of 30% or more on serial  testing suggests a  clinically important change)  Routine UA:  23-Oct-14 05:15   Color (UA) Yellow  Clarity (UA) Clear  Glucose (UA) >=500  Bilirubin (UA) Negative  Ketones (UA) Negative  Specific Gravity (UA) 1.007  Blood (UA) 1+  pH (UA) 5.0  Protein (UA) Negative  Nitrite (UA) Negative  Leukocyte Esterase (UA) Negative (Result(s) reported on 07 Oct 2013 at 05:37AM.)  RBC (UA) 2 /HPF  WBC (UA) 2 /HPF  Bacteria (UA) NONE SEEN  Epithelial Cells (UA) 3 /HPF  Mucous (UA) PRESENT  Hyaline Cast (UA) 20 /LPF (Result(s) reported on 07 Oct 2013 at 05:37AM.)   Radiology Results: CT:    23-Oct-14 00:29, CT Cervical Spine Without Contrast  CT Cervical Spine Without Contrast   REASON FOR EXAM:    fall; head injury; intoxicated; syncope  COMMENTS:   LMP: Post Hysterectomy    PROCEDURE: CT  - CT CERVICAL SPINE WO  - Oct 07 2013 12:29AM     RESULT: Multislice helical acquisition through the cervical spine is   reconstructed in the axial, coronal and sagittal planes at bone window   settings at 2 mm slice thickness increments. Comparison is made to the   previous examination performed on 07/30/2011.    Hardware  from previous anterior cervical fusion at C5-C6 remains in   place. There is C6-C7 degenerative narrowing with spurring present. There   is prominent spurring posteriorly from the superior endplate of C3. The   prevertebral soft tissues are normal. No fracture is evident. The   atlantoaxial alignment is maintained. There is an abnormal appearance of     the medial aspect of the right clavicle in the clavicular head with   surrounding soft tissue swelling and some areas of cortical lucency. The   findings could represent infection, arthropathy or posttraumaticchanges.   Correlate with clinical history.    IMPRESSION:   1. Degenerative and postsurgical changes in the spine. Degenerative or   infectious changes in the right clavicle. Posttraumatic changes in the   right clavicle or not excluded.    Dictation Site: 1  Verified By: Sundra Aland, M.D., MD    23-Oct-14 00:29, CT Head Without Contrast  CT Head Without Contrast   REASON FOR EXAM:    fall;  head injury;  intoxicated  COMMENTS:   LMP: Post Hysterectomy    PROCEDURE: CT  - CT HEAD WITHOUT CONTRAST  - Oct 07 2013 12:29AM     RESULT: Noncontrast CT of the brain is compared to the study of 06/16/2012.    There is a left frontal scalp laceration with underlying scalp hematoma.   The calvarium appears intact. The sinuses and mastoid air cells show   normal aeration. The ventricles and sulci appear to be within normal   limits for the patient's age without evidence of intracranial hemorrhage,   mass, mass effect or midline shift. There is no evolving infarct. The   appearance is otherwise stable.    IMPRESSION:   1. Mild atrophy appropriate for the patient's age. Left frontal scalp   hematoma. No acute intracranial abnormality.    Dictation Site: 1        Verified By: Sundra Aland, M.D., MD    Codeine: Other  PCN: Other  Sulfa drugs: Unknown  Hydrocodone: Rash  Vital Signs/Nurse's Notes: **Vital  Signs.:   23-Oct-14 10:02  Vital Signs Type Routine  Temperature Source oral  Pulse Pulse 92  Respirations Respirations 24  Pulse Ox % Pulse Ox % 100  Oxygen Delivery Room Air/ 21 %  Pulse Ox Heart Rate 92    Impression Syncope in a 69 year old female with history of hypertension on multiple antihypertensives, with alcohol abuse , diabetes mellitus, tobacco abuse.   Plan 1.  History of hypertension with fluctuations in blood pressure with syncope.  I think alcohol use is playing into issues with blood pressure and syncope with blood pressure agents currently on hold and will be restarted conservatively . Consideration of discontinuation of amlodipine since this was the last medication added over the last several weeks.Patient counseled regarding use of alcohol and possible interactions with blood pressure medications  as well as with diabetes/ Blood sugars. 2.  EKG changes- continue with plans for outpatient stress test.  Unsure about the significance of the long QT, possibly could be related to hypokalemia as well as hypomagnesium. Repeat EKG when Electrolytes are being replaced. 3.  Echocardiogram pending. 4.  Holter monitor report to be sent stat once husband returns to office today to look for any arrhythmia that may be contributing to syncope. 5.  Smoking cessation encouraged. 6.  Further review recommendations per Dr. Nehemiah Massed   Electronic Signatures: Roderic Palau (NP)  (Signed 23-Oct-14 12:09)  Authored: General Aspect/Present Illness, History and Physical Exam, Review of System, Home Medications, Labs, Radiology, Allergies, Vital Signs/Nurse's Notes, Impression/Plan   Last Updated: 23-Oct-14 12:09 by Roderic Palau (NP)

## 2015-04-07 NOTE — Consult Note (Signed)
Brief Consult Note: Diagnosis: Alcohol dependence.   Patient was seen by consultant.   Consult note dictated.   Orders entered.   Comments: Psychiatry: Patient seen. Patient with a history of alcohol abuse and recent increase in drinking. Patient denies any suicidal ieation and is not delirious. Supportive therapy done. Refer to IOP.  Electronic Signatures: Audery Amellapacs, Demia Viera T (MD)  (Signed 24-Oct-14 18:29)  Authored: Brief Consult Note   Last Updated: 24-Oct-14 18:29 by Audery Amellapacs, Axcel Horsch T (MD)

## 2015-04-07 NOTE — Op Note (Signed)
PATIENT NAME:  Cassandra [name], [name] MR#:  [id number] DATE OF BIRTH:  November 11, 1946  DATE OF PROCEDURE:  01/27/2013  PREOPERATIVE DIAGNOSIS:  Senile cataract right eye.  POSTOPERATIVE DIAGNOSIS:  Senile cataract right eye.  PROCEDURE:  Phacoemulsification with posterior chamber intraocular lens implantation of the right eye.  LENS: ZCBOO 23.0-diopter posterior chamber intraocular lens.  ULTRASOUND TIME: 16% of 1 minute, 19 seconds.  CDE 13.1.   SURGEON:  [place]had Marella Vanderpol, MD  ANESTHESIA:  Topical with tetracaine drops and 2% Xylocaine jelly.  COMPLICATIONS:  None.  DESCRIPTION OF PROCEDURE:  The patient was identified in the holding room and transported to the operating room and placed in the supine position under the operating microscope.  The right eye was identified as the operative eye and it was prepped and draped in the usual sterile ophthalmic fashion.  A 1 millimeter clear-corneal paracentesis was made at the 1:30 position.  The anterior chamber was filled with Viscoat viscoelastic.  A 2.4 millimeter keratome was used to make a near-clear corneal incision at the 10:30 position.  A curvilinear capsulorrhexis was made with a cystotome and capsulorrhexis forceps.  Balanced salt solution was used to hydrodissect and hydrodelineate the nucleus.  Phacoemulsification was then used in stop and chop fashion to remove the lens nucleus and epinucleus.  The remaining cortex was then removed using the irrigation and aspiration handpiece. Provisc was then placed into the capsular bag to distend it for lens placement.  A ZCBOO 23.0-diopter lens was then injected into the capsular bag.  The remaining viscoelastic was aspirated.  Wounds were hydrated with balanced salt solution.  The anterior chamber was inflated to a physiologic pressure with balanced salt solution.  0.1 mL of cefuroxime 10 mg/mL were injected into the anterior chamber for a dose of 1 mg of intracameral antibiotic at the completion of the  case. Miostat was placed into the anterior chamber to constrict the pupil.  No wound leaks were noted.  Topical Vigamox drops and Maxitrol ointment were applied to the eye.  The patient was taken to the recovery room in stable condition without complications of anesthesia or surgery. ____________________________ Deirdre Evenerhadwick R. Jakerra Floyd, MD crb:sb D: 01/27/2013 13:49:03 ET T: 01/27/2013 14:43:44 ET JOB#: [id number]  cc: Deirdre Evenerhadwick R. Xitlalic Maslin, MD, <Dictator> Lockie MolaHADWICK Tsugio Elison MD ELECTRONICALLY SIGNED 02/03/2013 11:31

## 2015-04-17 DIAGNOSIS — E039 Hypothyroidism, unspecified: Secondary | ICD-10-CM | POA: Diagnosis not present

## 2015-04-17 DIAGNOSIS — J019 Acute sinusitis, unspecified: Secondary | ICD-10-CM | POA: Diagnosis not present

## 2015-04-17 DIAGNOSIS — I10 Essential (primary) hypertension: Secondary | ICD-10-CM | POA: Diagnosis not present

## 2015-04-17 DIAGNOSIS — E119 Type 2 diabetes mellitus without complications: Secondary | ICD-10-CM | POA: Diagnosis not present

## 2015-05-22 DIAGNOSIS — H18222 Idiopathic corneal edema, left eye: Secondary | ICD-10-CM | POA: Diagnosis not present

## 2015-05-22 DIAGNOSIS — H4011X4 Primary open-angle glaucoma, indeterminate stage: Secondary | ICD-10-CM | POA: Diagnosis not present

## 2015-05-22 DIAGNOSIS — H1851 Endothelial corneal dystrophy: Secondary | ICD-10-CM | POA: Diagnosis not present

## 2015-05-24 DIAGNOSIS — Z961 Presence of intraocular lens: Secondary | ICD-10-CM | POA: Diagnosis not present

## 2015-05-24 DIAGNOSIS — H4011X4 Primary open-angle glaucoma, indeterminate stage: Secondary | ICD-10-CM | POA: Diagnosis not present

## 2015-05-24 DIAGNOSIS — H1851 Endothelial corneal dystrophy: Secondary | ICD-10-CM | POA: Diagnosis not present

## 2015-06-05 DIAGNOSIS — J019 Acute sinusitis, unspecified: Secondary | ICD-10-CM | POA: Diagnosis not present

## 2015-06-05 DIAGNOSIS — F17200 Nicotine dependence, unspecified, uncomplicated: Secondary | ICD-10-CM | POA: Diagnosis not present

## 2015-06-05 DIAGNOSIS — R070 Pain in throat: Secondary | ICD-10-CM | POA: Diagnosis not present

## 2015-06-05 DIAGNOSIS — E1165 Type 2 diabetes mellitus with hyperglycemia: Secondary | ICD-10-CM | POA: Diagnosis not present

## 2015-06-05 DIAGNOSIS — J302 Other seasonal allergic rhinitis: Secondary | ICD-10-CM | POA: Diagnosis not present

## 2015-06-05 DIAGNOSIS — I10 Essential (primary) hypertension: Secondary | ICD-10-CM | POA: Diagnosis not present

## 2015-07-28 DIAGNOSIS — R42 Dizziness and giddiness: Secondary | ICD-10-CM | POA: Diagnosis not present

## 2015-07-28 DIAGNOSIS — E039 Hypothyroidism, unspecified: Secondary | ICD-10-CM | POA: Diagnosis not present

## 2015-07-28 DIAGNOSIS — I10 Essential (primary) hypertension: Secondary | ICD-10-CM | POA: Diagnosis not present

## 2015-07-28 DIAGNOSIS — E1165 Type 2 diabetes mellitus with hyperglycemia: Secondary | ICD-10-CM | POA: Diagnosis not present

## 2015-07-28 DIAGNOSIS — E538 Deficiency of other specified B group vitamins: Secondary | ICD-10-CM | POA: Diagnosis not present

## 2015-08-18 DIAGNOSIS — E039 Hypothyroidism, unspecified: Secondary | ICD-10-CM | POA: Diagnosis not present

## 2015-08-18 DIAGNOSIS — E538 Deficiency of other specified B group vitamins: Secondary | ICD-10-CM | POA: Diagnosis not present

## 2015-08-18 DIAGNOSIS — E1065 Type 1 diabetes mellitus with hyperglycemia: Secondary | ICD-10-CM | POA: Diagnosis not present

## 2015-08-18 DIAGNOSIS — L409 Psoriasis, unspecified: Secondary | ICD-10-CM | POA: Diagnosis not present

## 2015-09-11 DIAGNOSIS — H4011X4 Primary open-angle glaucoma, indeterminate stage: Secondary | ICD-10-CM | POA: Diagnosis not present

## 2015-09-11 DIAGNOSIS — Z961 Presence of intraocular lens: Secondary | ICD-10-CM | POA: Diagnosis not present

## 2015-09-11 DIAGNOSIS — H1851 Endothelial corneal dystrophy: Secondary | ICD-10-CM | POA: Diagnosis not present

## 2015-09-11 DIAGNOSIS — H26492 Other secondary cataract, left eye: Secondary | ICD-10-CM | POA: Diagnosis not present

## 2015-09-14 ENCOUNTER — Emergency Department (HOSPITAL_COMMUNITY)
Admission: EM | Admit: 2015-09-14 | Discharge: 2015-09-14 | Disposition: A | Discharge status: Home / Self Care | Payer: Medicare Other | Attending: Emergency Medicine | Admitting: Emergency Medicine

## 2015-09-14 ENCOUNTER — Encounter (HOSPITAL_COMMUNITY): Payer: Self-pay | Admitting: Emergency Medicine

## 2015-09-14 DIAGNOSIS — E119 Type 2 diabetes mellitus without complications: Secondary | ICD-10-CM | POA: Insufficient documentation

## 2015-09-14 DIAGNOSIS — F439 Reaction to severe stress, unspecified: Secondary | ICD-10-CM

## 2015-09-14 DIAGNOSIS — Z720 Tobacco use: Secondary | ICD-10-CM | POA: Diagnosis not present

## 2015-09-14 DIAGNOSIS — Z79899 Other long term (current) drug therapy: Secondary | ICD-10-CM | POA: Insufficient documentation

## 2015-09-14 DIAGNOSIS — I159 Secondary hypertension, unspecified: Secondary | ICD-10-CM | POA: Diagnosis not present

## 2015-09-14 DIAGNOSIS — R42 Dizziness and giddiness: Secondary | ICD-10-CM | POA: Diagnosis not present

## 2015-09-14 LAB — URINALYSIS, ROUTINE W REFLEX MICROSCOPIC
Bilirubin Urine: NEGATIVE
GLUCOSE, UA: 100 mg/dL — AB
Ketones, ur: 40 mg/dL — AB
Leukocytes, UA: NEGATIVE
Nitrite: NEGATIVE
Protein, ur: >300 mg/dL — AB
SPECIFIC GRAVITY, URINE: 1.021 (ref 1.005–1.030)
Urobilinogen, UA: 0.2 mg/dL (ref 0.0–1.0)
pH: 5.5 (ref 5.0–8.0)

## 2015-09-14 LAB — I-STAT CHEM 8, ED
BUN: 17 mg/dL (ref 6–20)
CALCIUM ION: 1.13 mmol/L (ref 1.13–1.30)
CHLORIDE: 101 mmol/L (ref 101–111)
Creatinine, Ser: 1.1 mg/dL — ABNORMAL HIGH (ref 0.44–1.00)
GLUCOSE: 161 mg/dL — AB (ref 65–99)
HCT: 40 % (ref 36.0–46.0)
Hemoglobin: 13.6 g/dL (ref 12.0–15.0)
Potassium: 3.8 mmol/L (ref 3.5–5.1)
Sodium: 139 mmol/L (ref 135–145)
TCO2: 24 mmol/L (ref 0–100)

## 2015-09-14 LAB — CBG MONITORING, ED: Glucose-Capillary: 166 mg/dL — ABNORMAL HIGH (ref 65–99)

## 2015-09-14 LAB — URINE MICROSCOPIC-ADD ON

## 2015-09-14 MED ORDER — MECLIZINE HCL 25 MG PO TABS: 25.0000 mg | ORAL_TABLET | Freq: Three times a day (TID) | ORAL | Status: DC | PRN

## 2015-09-14 NOTE — ED Provider Notes (Signed)
CSN: [id number]     Arrival date & time 09/14/15  1303 History   First MD Initiated Contact with Patient 09/14/15 1321     Chief Complaint  Patient presents with  . Hypertension  . Dizziness  . Headache     (Consider location/radiation/quality/duration/timing/severity/associated sxs/prior Treatment) HPI   Cassandra [name] is a 69 y.o. female who is here for evaluation of intermittent dizziness for 1 month. She also has headaches for which she takes BC powder, 2 or 3 each day. Her doctor recently decreased her losartan to one half pill each day. She occasionally uses Ativan but feels like her dose is too strong. She denies paresthesias, focal weakness, chest pain, shortness of breath, cough, or paresthesia. She and her husband both state that she drinks too much, and sometimes drinks 2 treat her stress. She recently moved to a new apartment and feels better there than her prior apartment. There are no other known modifying factors.   Past Medical History  Diagnosis Date  . Hypertension   . Diabetes mellitus without complication   . Thyroid disease    Past Surgical History  Procedure Laterality Date  . Abdominal hysterectomy    . Appendectomy    . Tonsillectomy    . Cholecystectomy    . Hernia repair     History reviewed. No pertinent family history. Social History  Substance Use Topics  . Smoking status: Current Every Day Smoker  . Smokeless tobacco: None  . Alcohol Use: Yes   OB History    No data available     Review of Systems  All other systems reviewed and are negative.     Allergies  Hydrocodone  Home Medications   Prior to Admission medications   Medication Sig Start Date End Date Taking? Authorizing Provider  furosemide (LASIX) 20 MG tablet Take 1 tablet by mouth daily. 08/30/15  Yes Historical Provider, MD  levothyroxine (SYNTHROID, LEVOTHROID) 100 MCG tablet Take 1 tablet by mouth daily. 08/22/15  Yes Historical Provider, MD  hydrochlorothiazide  (HYDRODIURIL) 25 MG tablet Take 1 tablet by mouth daily.    Historical Provider, MD  insulin lispro (HUMALOG) 100 UNIT/ML injection Inject into the skin 3 (three) times daily with meals.    Historical Provider, MD  LANTUS 100 UNIT/ML injection Inject 10-12 Units into the skin daily. 06/12/15   Historical Provider, MD  LORazepam (ATIVAN) 0.5 MG tablet Take 1 tablet by mouth daily as needed. anxiety 07/29/15   Historical Provider, MD  losartan (COZAAR) 50 MG tablet Take 1 tablet by mouth daily.    Historical Provider, MD  metoCLOPramide (REGLAN) 10 MG tablet Take 1 tablet (10 mg total) by mouth every 8 (eight) hours as needed for nausea. 03/03/15   Nelva Nay, MD  omeprazole (PRILOSEC) 20 MG capsule Take 1 capsule by mouth 2 (two) times daily.    Historical Provider, MD   BP 193/76 mmHg  Pulse 92  Temp(Src) 98.4 F (36.9 C) (Oral)  Resp 18  Ht  (1.575 m)  Wt 112 lb (50.803 kg)  BMI 20.48 kg/m2  SpO2 100% Physical Exam  Constitutional: She is oriented to person, place, and time. She appears well-developed.  Appears older than stated age.  HENT:  Head: Normocephalic and atraumatic.  Right Ear: External ear normal.  Left Ear: External ear normal.  Eyes: Conjunctivae and EOM are normal. Pupils are equal, round, and reactive to light.  Neck: Normal range of motion and phonation normal. Neck supple.  Cardiovascular: Normal  rate, regular rhythm and normal heart sounds.   Pulmonary/Chest: Effort normal and breath sounds normal. She exhibits no bony tenderness.  Abdominal: Soft. There is no tenderness.  Musculoskeletal: Normal range of motion.  Neurological: She is alert and oriented to person, place, and time. No cranial nerve deficit or sensory deficit. She exhibits normal muscle tone. Coordination normal.  No dysarthria and aphasia or nystagmus.  Skin: Skin is warm, dry and intact.  Psychiatric: She has a normal mood and affect. Her behavior is normal. Judgment and thought content  normal.  Nursing note and vitals reviewed.   ED Course  Procedures (including critical care time)  Medications - No data to display  Patient Vitals for the past 24 hrs:  BP Temp Temp src Pulse Resp SpO2 Height Weight  09/14/15 1331 193/76 mmHg 98.4 F (36.9 C) Oral 92 18 100 % - -  09/14/15 1309 194/84 mmHg 99.1 F (37.3 C) Oral 92 16 97 %  (1.575 m) 112 lb (50.803 kg)    4:43 PM Reevaluation with update and discussion. After initial assessment and treatment, an updated evaluation reveals no additional complaints. Repeat blood pressure is stable. WENTZ,ELLIOTT L    Labs Review Labs Reviewed  CBG MONITORING, ED - Abnormal; Notable for the following:    Glucose-Capillary 166 (*)    All other components within normal limits    Imaging Review No results found. I have personally reviewed and evaluated these images and lab results as part of my medical decision-making.   EKG Interpretation None      MDM   Final diagnoses:  Dizziness  Stress  Secondary hypertension, unspecified    Hypertension without evidence for hypertensive urgency. Nonspecific intermittent dizziness, unlikely to represent a central process. Patient does admit to stress, and has trouble tolerating Ativan at low dose 0.5 mg.  Nursing Notes Reviewed/ Care Coordinated, and agree without changes. Applicable Imaging Reviewed.  Interpretation of Laboratory Data incorporated into ED treatment  Nursing Notes Reviewed/ Care Coordinated Applicable Imaging Reviewed Interpretation of Laboratory Data incorporated into ED treatment  The patient appears reasonably screened and/or stabilized for discharge and I doubt any other medical condition or other [facility] requiring further screening, evaluation, or treatment in the ED at this time prior to discharge.  Plan: Home Medications- Meclizine; Home Treatments- stop EtOH; return here if the recommended treatment, does not improve the symptoms; Recommended follow up-  PCP follow-up in one or 2 weeks. Referral given for wellness clinic.   Mancel Bale, MD 09/14/15 [phone]

## 2015-09-14 NOTE — ED Notes (Signed)
RN Signa Kell notified about Elevated Blood Pressure

## 2015-09-14 NOTE — Discharge Instructions (Signed)
Dizziness °Dizziness is a common problem. It is a feeling of unsteadiness or light-headedness. You may feel like you are about to faint. Dizziness can lead to injury if you stumble or fall. A person of any age group can suffer from dizziness, but dizziness is more common in older adults. °CAUSES  °Dizziness can be caused by many different things, including: °· Middle ear problems. °· Standing for too long. °· Infections. °· An allergic reaction. °· Aging. °· An emotional response to something, such as the sight of blood. °· Side effects of medicines. °· Tiredness. °· Problems with circulation or blood pressure. °· Excessive use of alcohol or medicines, or illegal drug use. °· Breathing too fast (hyperventilation). °· An irregular heart rhythm (arrhythmia). °· A low red blood cell count (anemia). °· Pregnancy. °· Vomiting, diarrhea, fever, or other illnesses that cause body fluid loss (dehydration). °· Diseases or conditions such as Parkinson's disease, high blood pressure (hypertension), diabetes, and thyroid problems. °· Exposure to extreme heat. °DIAGNOSIS  °Your health care provider will ask about your symptoms, perform a physical exam, and perform an electrocardiogram (ECG) to record the electrical activity of your heart. Your health care provider may also perform other heart or blood tests to determine the cause of your dizziness. These may include: °· Transthoracic echocardiogram (TTE). During echocardiography, sound waves are used to evaluate how blood flows through your heart. °· Transesophageal echocardiogram (TEE). °· Cardiac monitoring. This allows your health care provider to monitor your heart rate and rhythm in real time. °· Holter monitor. This is a portable device that records your heartbeat and can help diagnose heart arrhythmias. It allows your health care provider to track your heart activity for several days if needed. °· Stress tests by exercise or by giving medicine that makes the heart beat  faster. °TREATMENT  °Treatment of dizziness depends on the cause of your symptoms and can vary greatly. °HOME CARE INSTRUCTIONS  °· Drink enough fluids to keep your urine clear or pale yellow. This is especially important in very hot weather. In older adults, it is also important in cold weather. °· Take your medicine exactly as directed if your dizziness is caused by medicines. When taking blood pressure medicines, it is especially important to get up slowly. °· Rise slowly from chairs and steady yourself until you feel okay. °· In the morning, first sit up on the side of the bed. When you feel okay, stand slowly while holding onto something until you know your balance is fine. °· Move your legs often if you need to stand in one place for a long time. Tighten and relax your muscles in your legs while standing. °· Have someone stay with you for 1-2 days if dizziness continues to be a problem. Do this until you feel you are well enough to stay alone. Have the person call your health care provider if he or she notices changes in you that are concerning. °· Do not drive or use heavy machinery if you feel dizzy. °· Do not drink alcohol. °SEEK IMMEDIATE MEDICAL CARE IF:  °· Your dizziness or light-headedness gets worse. °· You feel nauseous or vomit. °· You have problems talking, walking, or using your arms, hands, or legs. °· You feel weak. °· You are not thinking clearly or you have trouble forming sentences. It may take a friend or family member to notice this. °· You have chest pain, abdominal pain, shortness of breath, or sweating. °· Your vision changes. °· You notice   any bleeding.  You have side effects from medicine that seems to be getting worse rather than better. MAKE SURE YOU:   Understand these instructions.  Will watch your condition.  Will get help right away if you are not doing well or get worse. Document Released: 05/28/2001 Document Revised: 12/07/2013 Document Reviewed: 06/21/2011 [facility]  Patient Information 2015 [place], [place]. This information is not intended to replace advice given to you by your health care provider. Make sure you discuss any questions you have with your health care provider.  Stress Stress-related medical problems are becoming increasingly common. The body has a built-in physical response to stressful situations. Faced with pressure, challenge or danger, we need to react quickly. Our bodies release hormones such as cortisol and adrenaline to help do this. These hormones are part of the "fight or flight" response and affect the metabolic rate, heart rate and blood pressure, resulting in a heightened, stressed state that prepares the body for optimum performance in dealing with a stressful situation. It is likely that early man required these mechanisms to stay alive, but usually modern stresses do not call for this, and the same hormones released in today's world can damage health and reduce coping ability. CAUSES  Pressure to perform at work, at school or in sports.  Threats of physical violence.  Money worries.  Arguments.  Family conflicts.  Divorce or separation from significant other.  Bereavement.  New job or unemployment.  Changes in location.  Alcohol or drug abuse. SOMETIMES, THERE IS NO PARTICULAR REASON FOR DEVELOPING STRESS. Almost all people are at risk of being stressed at some time in their lives. It is important to know that some stress is temporary and some is long term.  Temporary stress will go away when a situation is resolved. Most people can cope with short periods of stress, and it can often be relieved by relaxing, taking a walk or getting any type of exercise, chatting through issues with friends, or having a good night's sleep.  Chronic (long-term, continuous) stress is much harder to deal with. It can be psychologically and emotionally damaging. It can be harmful both for an individual and for friends and  family. SYMPTOMS Everyone reacts to stress differently. There are some common effects that help [place] recognize it. In times of extreme stress, people may:  Shake uncontrollably.  Breathe faster and deeper than normal (hyperventilate).  Vomit.  For people with asthma, stress can trigger an attack.  For some people, stress may trigger migraine headaches, ulcers, and body pain. PHYSICAL EFFECTS OF STRESS MAY INCLUDE:  Loss of energy.  Skin problems.  Aches and pains resulting from tense muscles, including neck ache, backache and tension headaches.  Increased pain from arthritis and other conditions.  Irregular heart beat (palpitations).  Periods of irritability or anger.  Apathy or depression.  Anxiety (feeling uptight or worrying).  Unusual behavior.  Loss of appetite.  Comfort eating.  Lack of concentration.  Loss of, or decreased, sex-drive.  Increased smoking, drinking, or recreational drug use.  For women, missed periods.  Ulcers, joint pain, and muscle pain. Post-traumatic stress is the stress caused by any serious accident, strong emotional damage, or extremely difficult or violent experience such as rape or war. Post-traumatic stress victims can experience mixtures of emotions such as fear, shame, depression, guilt or anger. It may include recurrent memories or images that may be haunting. These feelings can last for weeks, months or even years after the traumatic event that triggered them.  Specialized treatment, possibly with medicines and psychological therapies, is available. If stress is causing physical symptoms, severe distress or making it difficult for you to function as normal, it is worth seeing your caregiver. It is important to remember that although stress is a usual part of life, extreme or prolonged stress can lead to other illnesses that will need treatment. It is better to visit a doctor sooner rather than later. Stress has been linked to the  development of high blood pressure and heart disease, as well as insomnia and depression. There is no diagnostic test for stress since everyone reacts to it differently. But a caregiver will be able to spot the physical symptoms, such as:  Headaches.  Shingles.  Ulcers. Emotional distress such as intense worry, low mood or irritability should be detected when the doctor asks pertinent questions to identify any underlying problems that might be the cause. In case there are physical reasons for the symptoms, the doctor may also want to do some tests to exclude certain conditions. If you feel that you are suffering from stress, try to identify the aspects of your life that are causing it. Sometimes you may not be able to change or avoid them, but even a small change can have a positive ripple effect. A simple lifestyle change can make all the difference. STRATEGIES THAT CAN HELP DEAL WITH STRESS:  Delegating or sharing responsibilities.  Avoiding confrontations.  Learning to be more assertive.  Regular exercise.  Avoid using alcohol or street drugs to cope.  Eating a healthy, balanced diet, rich in fruit and vegetables and proteins.  Finding humor or absurdity in stressful situations.  Never taking on more than you know you can handle comfortably.  Organizing your time better to get as much done as possible.  Talking to friends or family and sharing your thoughts and fears.  Listening to music or relaxation tapes.  Relaxation techniques like deep breathing, meditation, and yoga.  Tensing and then relaxing your muscles, starting at the toes and working up to the head and neck. If you think that you would benefit from help, either in identifying the things that are causing your stress or in learning techniques to help you relax, see a caregiver who is capable of helping you with this. Rather than relying on medications, it is usually better to try and identify the things in your life  that are causing stress and try to deal with them. There are many techniques of managing stress including counseling, psychotherapy, aromatherapy, yoga, and exercise. Your caregiver can help you determine what is best for you. Document Released: 02/22/2003 Document Revised: 12/07/2013 Document Reviewed: 01/19/2008 [facility] Patient Information 2015 [place], [place]. This information is not intended to replace advice given to you by your health care provider. Make sure you discuss any questions you have with your health care provider.

## 2015-09-14 NOTE — ED Notes (Addendum)
Pt complaining of dizziness x 1 month, states it's affecting her walking. States she went to the eye doc Monday where she found her sys BP to be in the 200s. She then called her PCP who referred her here. States they've tried changing her BP meds multiple times without relief, and states she wants a referral for a new PCP. Today complaining of dizziness, headache, weight loss, and elevated BP. In triage BP 194/84. States she hasn't been eating d/t nausea, and is having trouble managing her blood sugar also

## 2015-09-20 DIAGNOSIS — F32A Depression, unspecified: Secondary | ICD-10-CM | POA: Insufficient documentation

## 2015-09-20 DIAGNOSIS — IMO0002 Reserved for concepts with insufficient information to code with codable children: Secondary | ICD-10-CM | POA: Insufficient documentation

## 2015-09-20 DIAGNOSIS — E538 Deficiency of other specified B group vitamins: Secondary | ICD-10-CM | POA: Insufficient documentation

## 2015-09-20 DIAGNOSIS — I129 Hypertensive chronic kidney disease with stage 1 through stage 4 chronic kidney disease, or unspecified chronic kidney disease: Secondary | ICD-10-CM | POA: Insufficient documentation

## 2015-09-20 DIAGNOSIS — I10 Essential (primary) hypertension: Secondary | ICD-10-CM | POA: Insufficient documentation

## 2015-09-20 DIAGNOSIS — H409 Unspecified glaucoma: Secondary | ICD-10-CM | POA: Insufficient documentation

## 2015-09-20 DIAGNOSIS — J309 Allergic rhinitis, unspecified: Secondary | ICD-10-CM | POA: Insufficient documentation

## 2015-09-20 DIAGNOSIS — Z794 Long term (current) use of insulin: Secondary | ICD-10-CM

## 2015-09-20 DIAGNOSIS — Z87898 Personal history of other specified conditions: Secondary | ICD-10-CM | POA: Insufficient documentation

## 2015-09-20 DIAGNOSIS — F329 Major depressive disorder, single episode, unspecified: Secondary | ICD-10-CM | POA: Insufficient documentation

## 2015-09-20 DIAGNOSIS — H8110 Benign paroxysmal vertigo, unspecified ear: Secondary | ICD-10-CM | POA: Insufficient documentation

## 2015-09-20 DIAGNOSIS — N183 Chronic kidney disease, stage 3 unspecified: Secondary | ICD-10-CM | POA: Insufficient documentation

## 2015-09-20 DIAGNOSIS — E039 Hypothyroidism, unspecified: Secondary | ICD-10-CM | POA: Insufficient documentation

## 2015-09-20 DIAGNOSIS — M542 Cervicalgia: Secondary | ICD-10-CM | POA: Insufficient documentation

## 2015-09-20 DIAGNOSIS — E1165 Type 2 diabetes mellitus with hyperglycemia: Secondary | ICD-10-CM | POA: Insufficient documentation

## 2015-09-20 DIAGNOSIS — Z720 Tobacco use: Secondary | ICD-10-CM | POA: Insufficient documentation

## 2015-09-20 DIAGNOSIS — F419 Anxiety disorder, unspecified: Secondary | ICD-10-CM | POA: Insufficient documentation

## 2015-09-20 DIAGNOSIS — R748 Abnormal levels of other serum enzymes: Secondary | ICD-10-CM | POA: Insufficient documentation

## 2015-09-22 ENCOUNTER — Ambulatory Visit: Payer: Self-pay | Admitting: Family Medicine

## 2015-10-06 DIAGNOSIS — E538 Deficiency of other specified B group vitamins: Secondary | ICD-10-CM | POA: Diagnosis not present

## 2015-10-06 DIAGNOSIS — E039 Hypothyroidism, unspecified: Secondary | ICD-10-CM | POA: Diagnosis not present

## 2015-10-12 ENCOUNTER — Ambulatory Visit: Payer: Self-pay | Admitting: Family Medicine

## 2015-10-20 DIAGNOSIS — F17200 Nicotine dependence, unspecified, uncomplicated: Secondary | ICD-10-CM | POA: Diagnosis not present

## 2015-10-20 DIAGNOSIS — E1029 Type 1 diabetes mellitus with other diabetic kidney complication: Secondary | ICD-10-CM | POA: Diagnosis not present

## 2015-10-20 DIAGNOSIS — E039 Hypothyroidism, unspecified: Secondary | ICD-10-CM | POA: Diagnosis not present

## 2015-10-20 DIAGNOSIS — F419 Anxiety disorder, unspecified: Secondary | ICD-10-CM | POA: Diagnosis not present

## 2015-10-20 DIAGNOSIS — Z794 Long term (current) use of insulin: Secondary | ICD-10-CM | POA: Diagnosis not present

## 2015-10-24 DIAGNOSIS — E1029 Type 1 diabetes mellitus with other diabetic kidney complication: Secondary | ICD-10-CM | POA: Diagnosis not present

## 2015-10-24 DIAGNOSIS — R809 Proteinuria, unspecified: Secondary | ICD-10-CM | POA: Diagnosis not present

## 2015-10-30 DIAGNOSIS — I10 Essential (primary) hypertension: Secondary | ICD-10-CM | POA: Diagnosis not present

## 2015-10-30 DIAGNOSIS — E1021 Type 1 diabetes mellitus with diabetic nephropathy: Secondary | ICD-10-CM | POA: Diagnosis not present

## 2015-10-30 DIAGNOSIS — R600 Localized edema: Secondary | ICD-10-CM | POA: Diagnosis not present

## 2015-10-30 DIAGNOSIS — R809 Proteinuria, unspecified: Secondary | ICD-10-CM | POA: Diagnosis not present

## 2015-11-02 ENCOUNTER — Other Ambulatory Visit (HOSPITAL_COMMUNITY): Payer: Self-pay | Admitting: Nephrology

## 2015-11-02 DIAGNOSIS — R809 Proteinuria, unspecified: Secondary | ICD-10-CM

## 2015-11-02 DIAGNOSIS — I10 Essential (primary) hypertension: Secondary | ICD-10-CM

## 2015-11-03 ENCOUNTER — Ambulatory Visit: Payer: Self-pay | Admitting: Family Medicine

## 2015-11-15 DIAGNOSIS — L409 Psoriasis, unspecified: Secondary | ICD-10-CM | POA: Diagnosis not present

## 2015-11-17 ENCOUNTER — Ambulatory Visit: Payer: Self-pay | Admitting: Family Medicine

## 2015-11-17 ENCOUNTER — Ambulatory Visit (HOSPITAL_COMMUNITY): Payer: Medicare Other

## 2015-12-22 ENCOUNTER — Ambulatory Visit: Payer: Self-pay | Admitting: Family Medicine

## 2015-12-27 ENCOUNTER — Ambulatory Visit: Payer: Self-pay | Admitting: Family Medicine

## 2016-01-02 ENCOUNTER — Telehealth: Payer: Self-pay | Admitting: Family Medicine

## 2016-01-02 NOTE — Telephone Encounter (Signed)
It looks like it was triamcinolone acetonide cream 0.1% (in [employer]) We hope she's doing well

## 2016-01-02 NOTE — Telephone Encounter (Signed)
Pt needs to know what type of cream Dr Sherie Don had given her for psoriasis.  Please call pt.

## 2016-01-03 ENCOUNTER — Ambulatory Visit: Payer: Self-pay | Admitting: Family Medicine

## 2016-01-03 NOTE — Telephone Encounter (Signed)
Patient notified

## 2016-01-10 ENCOUNTER — Ambulatory Visit: Payer: Self-pay | Admitting: Family Medicine

## 2016-01-12 DIAGNOSIS — L400 Psoriasis vulgaris: Secondary | ICD-10-CM | POA: Diagnosis not present

## 2016-01-16 DIAGNOSIS — L409 Psoriasis, unspecified: Secondary | ICD-10-CM | POA: Diagnosis not present

## 2016-01-17 ENCOUNTER — Ambulatory Visit (INDEPENDENT_AMBULATORY_CARE_PROVIDER_SITE_OTHER): Payer: Medicare Other | Admitting: Family Medicine

## 2016-01-17 ENCOUNTER — Encounter: Payer: Self-pay | Admitting: Family Medicine

## 2016-01-17 VITALS — BP 169/96 | HR 112 | Temp 98.7°F | Ht 60.5 in | Wt 104.0 lb

## 2016-01-17 DIAGNOSIS — Z720 Tobacco use: Secondary | ICD-10-CM

## 2016-01-17 DIAGNOSIS — R55 Syncope and collapse: Secondary | ICD-10-CM

## 2016-01-17 DIAGNOSIS — J3489 Other specified disorders of nose and nasal sinuses: Secondary | ICD-10-CM | POA: Diagnosis not present

## 2016-01-17 DIAGNOSIS — I129 Hypertensive chronic kidney disease with stage 1 through stage 4 chronic kidney disease, or unspecified chronic kidney disease: Secondary | ICD-10-CM | POA: Diagnosis not present

## 2016-01-17 DIAGNOSIS — F32A Depression, unspecified: Secondary | ICD-10-CM

## 2016-01-17 DIAGNOSIS — R05 Cough: Secondary | ICD-10-CM | POA: Diagnosis not present

## 2016-01-17 DIAGNOSIS — R053 Chronic cough: Secondary | ICD-10-CM

## 2016-01-17 DIAGNOSIS — F329 Major depressive disorder, single episode, unspecified: Secondary | ICD-10-CM

## 2016-01-17 DIAGNOSIS — N183 Chronic kidney disease, stage 3 (moderate): Secondary | ICD-10-CM

## 2016-01-17 DIAGNOSIS — E1022 Type 1 diabetes mellitus with diabetic chronic kidney disease: Secondary | ICD-10-CM | POA: Diagnosis not present

## 2016-01-17 MED ORDER — LOSARTAN POTASSIUM 100 MG PO TABS: 100.0000 mg | ORAL_TABLET | Freq: Every day | ORAL | Status: DC

## 2016-01-17 MED ORDER — SERTRALINE HCL 50 MG PO TABS: 75.0000 mg | ORAL_TABLET | Freq: Every day | ORAL | Status: DC

## 2016-01-17 MED ORDER — HYDROCHLOROTHIAZIDE 25 MG PO TABS: 25.0000 mg | ORAL_TABLET | Freq: Every day | ORAL | Status: DC

## 2016-01-17 NOTE — Patient Instructions (Addendum)
Request lab results from Dr. Samuella Bruin recommend that you quit using the lorazepam because it is addictive Increase the sertraline from 50 mg daily to 75 mg daily We'll refer you to an ENT specialist Please have the chest xray done in [facility] Please do schedule an appointment to see your eye doctor, and have your eyes examined every year Check your feet every night and let me know right away of any sores, infections, numbness, etc. Try to limit sweets, white bread, white rice, white potatoes It is okay for you to not check your fingerstick blood sugars (per [employer] [facility] [employer] Best Practices) We'll get the scan of your neck in [facility] have you see the new endocrinologist in [place] Return to see me in 1 month Stay well-hydrated I do encourage you to quit smoking Call [phone] to sign up for smoking cessation classes You can call 1-800-QUIT-NOW to talk with a smoking cessation coach

## 2016-01-17 NOTE — Assessment & Plan Note (Addendum)
On sertraline; improved but not to goal; increase that dose from 50 mg to 75 mg daily

## 2016-01-17 NOTE — Assessment & Plan Note (Signed)
Continue ARB and try to follow DASH guidelines

## 2016-01-17 NOTE — Progress Notes (Signed)
BP 169/96 mmHg  Pulse 112  Temp(Src) 98.7 F (37.1 C)  Ht 5' 0.5" (1.537 m)  Wt 104 lb (47.1Merril Abbe.97 kg/m2  SpO2 98%   Subjective:    Patient ID: Cassandra [name], female    DOB: [birth year], 70 y.o.   MRN: [id number]  HPI: Cassandra [name] is a 70 y.o. female  Chief Complaint  Patient presents with  . Re-Establish Care    patient needs to be seen for follow up care and med refills. She states that her dematologist says she needs a shot of "penicillian" before he can prescribe her an injection for psoriasis.  . Hypertension    She states she has been out of her Losartan . She also needs refills on HCTZ.  . Medication Refill    She states she needs refills on Losartan, HCTZ, Lorazepam, Sertraline, and Levothyroxine.  . Immunizations    She would be willing to get a pneumoia vaccine if she thinks it's ok   (patient actually left our practice and is now coming back again after a long absence)  She says she hasn't had her BP pill in two days; she thinks something else needs to be added, though; not controlled even on those two medicines; not checking at home; she gets dizzy when standing; fell 3 times in the last 2 weeks; not from legs giving out She went to the eye doctor for laser surgery, but he sent her to the hospital with a BP over 200; that was November She has had two or three EKGs this year; no heart doctor; no hx of aortic stenosis, no hx of carotid blockages  Diabetes mellitus; Dr. Tedd Sias was taking care of her type 1 diabetes; then she saw another endocrinologist; she can't remember her last A1c; last one was 7; she is not eating well; not currently seeing anyone for her diabetes  Dr. Tedd Sias put her on zoloft because of depression; just days when she doesn't want anything; she has lost weight; other days she craves stuff; started 6 weeks ago and it is helping but not completely better  Her last cholesterol was the endocrinologist; they took 8 tubes of blood  the other day to get approved for Humira for her psoriasis; she saw the dermatologist yesterday; derm says she has an infection and she needs a shot of PCN to kick-start her infection; he thinks it is a sinus infection and bronchitis; she started having sinus drainage before christmas; no fevers; no night sweats; just coughing, morning to 11 am and then again from 5 pm until bedtime; she took OTC cough syrup; she is allergic to [facility]; she went to the walk-in clinic and they didn't pick up on it and was given PCN and did fine and has taken PCN since and amoxicillin and has done Augmentin but vomited; cannot take azithromycin, no reaction, it just doesn't work; having daily headaches since December; living off of BCs; no hx of sinus surgery; sore throat just once in a while; he also says that the shot of PCN might help her psoriasis; she was under a lot of stress until August; renting house from friend, had termites; other house problems  I called the dermatologist and personally spoke with him, asking did he really want me to give her a shot of penicillin; he said no no no, lots of upper respiratory congestion; contraindication for her new medicine is infection; likely gram positive source with her open sites, if viral or allergy;  no biologic if active bacterial infection; Jan 27th, WBC slightly 10.6k, no left shift, some eos with her extensive skin disease  Hx of pancreatitis and alcohol use and elevated LFTs; hospitalized for ten days; she was told she was lucky to be alive; her body temp dropped, blood sugar was 1100  She has lorazepam, Dec 1st was bottle, Dr. Concepcion Elk, 0.5 mg white circular pills; there are 38 pills in the bottle, but only 30 dispensed  Relevant past medical, surgical, family and social history reviewed and updated as indicated. Interim medical history since our last visit reviewed. Allergies and medications reviewed and updated.  Review of Systems Per HPI unless specifically  indicated above     Objective:    BP 169/96 mmHg  Pulse 112  Temp(Src) 98.7 F (37.1 C)  Ht 5' 0.5" (1.537 m)  Wt 104 lb (47.174 kg)  BMI 19.97 kg/m2  SpO2 98%  Wt Readings from Last 3 Encounters:  01/17/16 104 lb (47.174 kg)  08/25/14 113 lb (51.256 kg)  09/14/15 112 lb (50.803 kg)    Physical Exam  Constitutional: No distress.  HENT:  Head: Normocephalic and atraumatic.  Eyes: EOM are normal. No scleral icterus.  Neck: No thyromegaly present.  Cardiovascular: Normal rate and regular rhythm.   Heart rate under 100 during auscultation  Pulmonary/Chest: Effort normal and breath sounds normal.  Abdominal: Soft. She exhibits no distension. There is no tenderness.  Musculoskeletal: She exhibits no edema.  Neurological: She is alert. She displays no tremor.  Skin: Rash (too numerous to count guttate erythematous plaques and papules over the entire body, open raw areas on the lower anterior legs, no weeping or oozing) noted.  Psychiatric: Her mood appears anxious. Her affect is not blunt. She does not exhibit a depressed mood.  Good eye contact with examiner   Diabetic Foot Form - Detailed   Diabetic Foot Exam - detailed  Diabetic Foot exam was performed with the following findings:  Yes 01/17/2016  5:11 PM  Visual Foot Exam completed.:  Yes  Are the toenails long?:  No  Are the toenails thick?:  No  Is there limited skin dorsiflexion?:  No  Are the toenails ingrown?:  No  Normal Range of Motion:  Yes    Pulse Foot Exam completed.:  Yes  Right Dorsalis Pedis:  Present Left Dorsalis Pedis:  Present  Sensory Foot Exam Completed.:  Yes  Swelling:  No  Semmes-Weinstein Monofilament Test  R Site 1-Great Toe:  Pos L Site 1-Great Toe:  Pos  R Site 4:  Pos L Site 4:  Pos  R Site 5:  Pos L Site 5:  Pos    Comments:  Diminished sensation over web between great and 2nd toe left foot dorsal surface      Results for orders placed or performed in visit on 09/20/15  HM DIABETES EYE  EXAM  Result Value Ref Range   HM Diabetic Eye Exam Retinopathy (A) No Retinopathy  HM COLONOSCOPY  Result Value Ref Range   HM Colonoscopy per PP       Assessment & Plan:   Problem List Items Addressed This Visit      Cardiovascular and Mediastinum   Benign hypertension with CKD (chronic kidney disease) stage III    Continue ARB and try to follow DASH guidelines      Relevant Medications   hydrochlorothiazide (HYDRODIURIL) 25 MG tablet   losartan (COZAAR) 100 MG tablet     Endocrine   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)    Refer to endocrinologist in [place]; foot exam by MD today; continue ARB; checking FSBS 3-4 x a day      Relevant Medications   losartan (COZAAR) 100 MG tablet   Other Relevant Orders   Ambulatory referral to Endocrinology     Other   Depression - Primary    On sertraline; improved but not to goal; increase that dose from 50 mg to 75 mg daily      Relevant Medications   sertraline (ZOLOFT) 50 MG tablet   Tobacco use    Encouraged her to stop smoking       Other Visit Diagnoses    Sinus drainage        Relevant Orders    Ambulatory referral to ENT    Chronic cough        Relevant Orders    DG Chest 2 View    Pre-syncope        Relevant Medications    hydrochlorothiazide (HYDRODIURIL) 25 MG tablet    losartan (COZAAR) 100 MG tablet    Other Relevant Orders    US Carotid Duplex Bilateral       Follow up plan: Return in about 1 month (around 02/14/2016) for blood pressure, etc..  Requesting labs that were just done by derm (she reports many vials); additional labs based on what was drawn and what may still need to be obtained  An after-visit summary was printed and given to the patient at check-out.  Please see the patient instructions which may contain other information and recommendations beyond what is mentioned above in the assessment and plan.  Face-to-face time with patient was more than 40 minutes, >50% time  spent counseling and coordination of care

## 2016-01-17 NOTE — Assessment & Plan Note (Signed)
Refer to endocrinologist in [place]; foot exam by MD today; continue ARB; checking FSBS 3-4 x a day

## 2016-01-18 ENCOUNTER — Telehealth: Payer: Self-pay

## 2016-01-18 NOTE — Telephone Encounter (Signed)
Patient called stated she needs Dr. Sherie Don to contact her immediately. No further information provided. Thanks.

## 2016-01-18 NOTE — Telephone Encounter (Signed)
Patient called, she was reading her AVS and saw the CKD stage 3 on there. She is very upset that it says she has that when we did not even do "a pee test" on her yesterday. She would like to talk to you.

## 2016-01-18 NOTE — Telephone Encounter (Signed)
Discussed diagnosis; she has seen two separate kidney doctors for this already, reviewed [facility] note; proteinuria, CKD 2/3; avoid NSAIDs Return to see me February 14th at 11:00 am -- [facility] staff, please add to schedule

## 2016-01-18 NOTE — Telephone Encounter (Signed)
Routing back to Dr. Sherie Don.

## 2016-01-19 NOTE — Telephone Encounter (Signed)
Patient added to schedule.

## 2016-01-30 ENCOUNTER — Ambulatory Visit: Payer: Self-pay | Admitting: Family Medicine

## 2016-01-31 ENCOUNTER — Telehealth: Payer: Self-pay | Admitting: Family Medicine

## 2016-01-31 DIAGNOSIS — J410 Simple chronic bronchitis: Secondary | ICD-10-CM | POA: Diagnosis not present

## 2016-01-31 DIAGNOSIS — J320 Chronic maxillary sinusitis: Secondary | ICD-10-CM | POA: Diagnosis not present

## 2016-01-31 DIAGNOSIS — H6122 Impacted cerumen, left ear: Secondary | ICD-10-CM | POA: Diagnosis not present

## 2016-01-31 DIAGNOSIS — H8141 Vertigo of central origin, right ear: Secondary | ICD-10-CM | POA: Diagnosis not present

## 2016-01-31 DIAGNOSIS — J322 Chronic ethmoidal sinusitis: Secondary | ICD-10-CM | POA: Diagnosis not present

## 2016-01-31 NOTE — Telephone Encounter (Signed)
I called to talk with patient; man answered and said she was asleep; I will try her tomorrow; I still need her labs, and her chest xray and carotids; I'm sorry that she doesn't want to come see me  [facility] staff -- please track down results of her labs (done through derm), and the chest xray and carotid scans  In regards to her comment below, I sent her to the ENT to see if she had a sinus infection  Here is the copy and paste from my referral comments: Note    Patient lives in [place], please schedule with group near her home; need sinuses evaluated for infection prior to starting Humira

## 2016-01-31 NOTE — Telephone Encounter (Signed)
It has been two weeks since patient's last appointment I don't have her lab results I don't have her chest xray results I don't have her carotid scan results Please call patient and have her schedule an appt to come in and see me later today or tomorrow We need to work up her symptoms and conditions to get to the bottom of things

## 2016-01-31 NOTE — Telephone Encounter (Signed)
I spoke with patient and she stated she had just came in from ENT in [place] and that her doctor there told her she has had a sinus infection for a long time. When she was last seen in our office, she said Dr. Sherie Don told her there was nothing wrong with her. Patient sounded upset and said she was referral to cardiology and will go to that appointment and is not planning to come and have a f/u with Dr. Sherie Don in a long while.

## 2016-01-31 NOTE — Assessment & Plan Note (Signed)
Encouraged her to stop smoking.  

## 2016-02-01 ENCOUNTER — Ambulatory Visit: Payer: Medicare Other | Admitting: Endocrinology

## 2016-02-02 ENCOUNTER — Telehealth: Payer: Self-pay | Admitting: Endocrinology

## 2016-02-02 NOTE — Telephone Encounter (Signed)
Patient no showed today's appt. Please advise on how to follow up. °A. No follow up necessary. °B. Follow up urgent. Contact patient immediately. °C. Follow up necessary. Contact patient and schedule visit in ___ days. °D. Follow up advised. Contact patient and schedule visit in ____weeks. ° °

## 2016-02-02 NOTE — Telephone Encounter (Signed)
Reschedule appointment as soon as possible 

## 2016-02-08 NOTE — Telephone Encounter (Signed)
Patient has not had the scans done. Tried to call patient no answer. Not sure what dermatologist she went to.

## 2016-02-08 NOTE — Telephone Encounter (Signed)
I added her dermatologist to her Care Team at last visit; check there please for name and contact info; thank you!

## 2016-02-08 NOTE — Telephone Encounter (Signed)
Unable to get records, patient has to sign a release. Unable to contact patient. Fax: [phone]

## 2016-02-08 NOTE — Telephone Encounter (Signed)
Per note from her visit, Judeth Cornfield checked her out at 5:10pm.  I will forward to [place] as I know nothing about what is going on with this patient.

## 2016-02-08 NOTE — Telephone Encounter (Signed)
We requested the labs at her visit on Feb 1st so a release should have been signed that day. Forwarding to [place] Please ask patient to schedule a follow-up appointment with me in the next week

## 2016-02-09 NOTE — Telephone Encounter (Signed)
Cassandra [name]-- I need lab results and I need someone to schedule an appt for this patient You should have gotten a signed release to get labs from Dr. Sherryl Barters office when you checked this patient out at her last visit; we need those labs; please resend the consent if needed; the bottom line is that I need her lab results from Dr. Jorja Loa Please schedule her an appt to follow-up with me in the next 7 days; thank you

## 2016-02-09 NOTE — Telephone Encounter (Signed)
I don't understand the question

## 2016-02-12 ENCOUNTER — Ambulatory Visit (INDEPENDENT_AMBULATORY_CARE_PROVIDER_SITE_OTHER): Payer: Medicare Other | Admitting: Cardiovascular Disease

## 2016-02-12 ENCOUNTER — Encounter: Payer: Self-pay | Admitting: Cardiovascular Disease

## 2016-02-12 VITALS — BP 158/80 | HR 89 | Ht 60.05 in | Wt 104.0 lb

## 2016-02-12 DIAGNOSIS — Z720 Tobacco use: Secondary | ICD-10-CM | POA: Diagnosis not present

## 2016-02-12 DIAGNOSIS — E038 Other specified hypothyroidism: Secondary | ICD-10-CM | POA: Diagnosis not present

## 2016-02-12 DIAGNOSIS — I10 Essential (primary) hypertension: Secondary | ICD-10-CM | POA: Diagnosis not present

## 2016-02-12 DIAGNOSIS — F17210 Nicotine dependence, cigarettes, uncomplicated: Secondary | ICD-10-CM | POA: Diagnosis not present

## 2016-02-12 DIAGNOSIS — R42 Dizziness and giddiness: Secondary | ICD-10-CM | POA: Diagnosis not present

## 2016-02-12 MED ORDER — MECLIZINE HCL 25 MG PO TABS: 25.0000 mg | ORAL_TABLET | Freq: Three times a day (TID) | ORAL | Status: DC | PRN

## 2016-02-12 MED ORDER — CARVEDILOL 12.5 MG PO TABS: 12.5000 mg | ORAL_TABLET | Freq: Two times a day (BID) | ORAL | Status: DC

## 2016-02-12 NOTE — Progress Notes (Signed)
Cardiology Office Note   Date:  02/12/2016   ID:  Cassandra [name], DOB 1946/09/11, MRN [id number]  PCP:  [facility]  Cardiologist:   Madilyn Hook, MD   Chief Complaint  Patient presents with  . New Evaluation    Hypertension, Swelling in feet, Dizziness X1year, no shortness of breath. Fallen twice due to dizziness in 2 weeks      History of Present Illness: Cassandra [name] is a 70 y.o. female with hypertension, diabetes mellitus type 1, hypothyroidism, CKD 3, alcohol abuse, pancreatitits and ongoing tobacco abuse who presents for management of dizziness and hypertension.  [name] reports feeling dizzy for approximately 1 year.  She reports several falls due to dizziness.  She feels as though she doesn't know where her feet are in space.  This is different from when she experiences vertigo.  She denies any chest pain or shortness of breath, but notes that her vision has been deteriorating over the last several months.  She had cataract surgery two years ago and was scheduled to have this revised.  However, when she presented for surgery her BP was nearly 200 mmHg, so it was deferred.  She was evaluated for dizziness by her ENT, but they referred her to cardiology after noting that her BP was >200 systolic.  She notes feeling especially dizzy when in the shower and when she first gets out of bed in the AM.  She also has a sinus infection that has been present since December.  She thinks this makes the dizziness worse as well.  She endorses a headache when her BP is elevated.   Past Medical History  Diagnosis Date  . Diabetes mellitus without complication (HCC)   . Thyroid disease   . 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 Depression   . Hypertension   . CKD (chronic kidney disease) stage 3, GFR 30-59 ml/min   . Hypothyroidism   . Vitamin B12 deficiency   . Benign paroxysmal positional vertigo   . Broken shoulder   . Broken toes   . Broken  finger   . Migraines   . GERD (gastroesophageal reflux disease)   . Anxiety   . Interstitial cystitis     bladder stretched every 9 months  . Allergic rhinitis   . Glaucoma   . Benign hypertension with CKD (chronic kidney disease) stage III   . Tobacco use   . Cervicalgia   . Elevated liver enzymes Hep B/C neg 2014  . Bipolar disorder (HCC)   . History of alcohol use   . Psoriasis     Past Surgical History  Procedure Laterality Date  . Appendectomy    . Tonsillectomy    . Cholecystectomy    . Hernia repair    . Cataract extraction    . Abdominal hysterectomy      with oophorectomy  . Bladder surgery    . Cervical disc surgery       Current Outpatient Prescriptions  Medication Sig Dispense Refill  . cyanocobalamin (,VITAMIN B-12,) 1000 MCG/ML injection Inject 1,000 mcg into the muscle every 30 (thirty) days.    . hydrochlorothiazide (HYDRODIURIL) 25 MG tablet Take 1 tablet (25 mg total) by mouth daily. 30 tablet 5  . insulin lispro (HUMALOG) 100 UNIT/ML injection Inject into the skin once. Sliding scale as needed    . LANTUS 100 UNIT/ML injection Inject 10 Units into the skin every morning.   5  .  levothyroxine (SYNTHROID, LEVOTHROID) 100 MCG tablet Take 1 tablet by mouth daily. Take only on Mon, Wed, Friday.    Marland Kitchen LORazepam (ATIVAN) 0.5 MG tablet 0.5 mg once as needed.  2  . losartan (COZAAR) 100 MG tablet Take 1 tablet (100 mg total) by mouth daily. 30 tablet 3  . meclizine (ANTIVERT) 25 MG tablet Take 1 tablet (25 mg total) by mouth 3 (three) times daily as needed for dizziness. 30 tablet 0  . sodium chloride (MURO 128) 5 % ophthalmic solution Reported on 01/17/2016    . carvedilol (COREG) 12.5 MG tablet Take 1 tablet (12.5 mg total) by mouth 2 (two) times daily with a meal. 60 tablet 11  . sertraline (ZOLOFT) 50 MG tablet Take 1.5 tablets (75 mg total) by mouth daily. 45 tablet 5   No current facility-administered medications for this visit.    Allergies:    Ciprofloxacin; Codeine; Doxycycline; Hydrocodone; and Omnicef    Social History:  The patient  reports that she has been smoking Cigarettes.  She has a 2.5 pack-year smoking history. She has never used smokeless tobacco. She reports that she drinks about 4.2 oz of alcohol per week. She reports that she does not use illicit drugs.   Family History:  The patient's family history includes Alcohol abuse in her mother; Arthritis in her mother; Asthma in her mother; COPD in her mother; Cancer in her mother and paternal grandmother; Diabetes in her father, paternal grandfather, and paternal grandmother; Heart attack in her father; Heart disease in her father and paternal grandmother; Hypertension in her father and mother; Lung disease in her mother; Migraines in her mother; Stroke in her mother and paternal grandmother.    ROS:  Please see the history of present illness.   Otherwise, review of systems are positive for none.   All other systems are reviewed and negative.    PHYSICAL EXAM: VS:  BP 158/80 mmHg  Pulse 89  Ht 5' 0.05" (1.525 m)  Wt 47.174 kg (104 lb)  BMI 20.28 kg/m2 , BMI Body mass index is 20.28 kg/(m^2).  Repeat BP 192/86 GENERAL:  Well appearing HEENT:  Pupils equal round and reactive, fundi not visualized, oral mucosa unremarkable NECK:  No jugular venous distention, waveform within normal limits, carotid upstroke brisk and symmetric, no bruits, no thyromegaly LYMPHATICS:  No cervical adenopathy LUNGS:  Clear to auscultation bilaterally HEART:  RRR.  PMI not displaced or sustained,S1 and S2 within normal limits, no S3, no S4, no clicks, no rubs, no murmurs ABD:  Flat, positive bowel sounds normal in frequency in pitch, no bruits, no rebound, no guarding, no midline pulsatile mass, no hepatomegaly, no splenomegaly EXT:  2 plus pulses throughout, no edema, no cyanosis no clubbing SKIN:  No rashes no nodules NEURO:  Cranial nerves II through XII grossly intact, motor grossly intact  throughout PSYCH:  Cognitively intact, oriented to person place and time    EKG:  EKG is ordered today. The ekg ordered today demonstrates sinus rhythm rate 89 bpm.     Recent Labs: 03/03/2015: ALT 16; Platelets 281 09/14/2015: BUN 17; Creatinine, Ser 1.10*; Hemoglobin 13.6; Potassium 3.8; Sodium 139    Lipid Panel No results found for: CHOL, TRIG, HDL, CHOLHDL, VLDL, LDLCALC, LDLDIRECT    Wt Readings from Last 3 Encounters:  02/12/16 47.174 kg (104 lb)  01/17/16 47.174 kg (104 lb)  08/25/14 51.256 kg (113 lb)      ASSESSMENT AND PLAN:  # Hypertension:  Ms. Nakayama's blood pressure  is very poorly-controlled.  We will add carvedilol 12.5 mg q12 hours to her HCTZ and losartan.  She does not have a way of checking her blood pressure recheck it in one week.  On repeat her blood pressure was 192/82.  # Dizziness:  This may be attributed to her poorly controlled hypertension. We are adding carvedilol to her regimen as above. I have also renewed her meclizine, though this seems to be different than her symptoms of vertigo. It seems as though her "dizziness" is ore related to a she will need a neurology evaluation.  # Hypothyroidism: Check TSH, free T4.  # Tobacco abuse: Ms. Belcher is smoking 4 cigarettes daily. She states that she will be unable to stop while her husband continues smoking.We discussed option is on interested in  Any of them at this time.  E spent 5 minutes discussing smoking cessation.  Current medicines are reviewed at length with the patient today.  The patient does not have concerns regarding medicines.  The following changes have been made:  Start carvedilol 12.5 mg twice a day  Labs/ tests ordered today include:   Orders Placed This Encounter  Procedures  . T4, free  . TSH  . EKG 12-Lead     Disposition:   FU with [facility] C. Duke Salvia, MD, [facility] in 1 week.    This note was written with the assistance of speech recognition software.  Please excuse any  transcriptional errors.  Signed, Cresencio Reesor C. Duke Salvia, MD, [facility]  02/12/2016 5:07 PM    [facility]

## 2016-02-12 NOTE — Patient Instructions (Signed)
Medication Instructions: Dr Duke Salvia has recommended making the following medication changes: START Carvedilol (Coreg) 12.5 mg - take 1 tablet (12.5 mg total) by mouth twice daily  >>A new prescription has been sent to your pharmacy electronically  Labwork: Your physician recommends that you return for lab work TODAY.  Testing/Procedures: NONE  Follow-up: Dr Duke Salvia recommends that you schedule a follow-up appointment in 1 week.  If you need a refill on your cardiac medications before your next appointment, please call your pharmacy.

## 2016-02-12 NOTE — Telephone Encounter (Signed)
Pt already has an appt this Wednesday for a 30 minute follow up. It does not appear that I had her complete a release form. I will have the pt complete one at her arrival Wednesday and contact the physician's office to see if I can get the labs expedited. Thanks.

## 2016-02-13 LAB — T4, FREE: FREE T4: 1.1 ng/dL (ref 0.8–1.8)

## 2016-02-13 LAB — TSH: TSH: 0.87 m[IU]/L

## 2016-02-14 ENCOUNTER — Telehealth: Payer: Self-pay | Admitting: Cardiovascular Disease

## 2016-02-14 ENCOUNTER — Encounter: Payer: Self-pay | Admitting: Family Medicine

## 2016-02-14 ENCOUNTER — Ambulatory Visit: Payer: Medicare Other | Admitting: Family Medicine

## 2016-02-14 NOTE — Telephone Encounter (Signed)
New message      Calling to get thyroid results

## 2016-02-14 NOTE — Telephone Encounter (Addendum)
Pt c/o medication issue:  1. Name of Medication: Carvedilol  2. How are you currently taking this medication (dosage and times per day)? New med-prescribed yesterday  3. Are you having a reaction (difficulty breathing--STAT)? No  4. What is your medication issue? Do I take previous BP med along with the Carvedilol or stop it  [phone] pt number

## 2016-02-14 NOTE — Telephone Encounter (Signed)
Notified pt to add carvedilol to current regimen. No meds d/c'ed at appt. Pt voiced understanding.

## 2016-02-14 NOTE — Telephone Encounter (Signed)
Pt aware of results 

## 2016-02-21 ENCOUNTER — Encounter: Payer: Self-pay | Admitting: Cardiovascular Disease

## 2016-02-21 ENCOUNTER — Ambulatory Visit: Payer: Medicare Other | Admitting: Cardiovascular Disease

## 2016-02-23 ENCOUNTER — Ambulatory Visit: Payer: Medicare Other | Admitting: Cardiovascular Disease

## 2016-02-27 ENCOUNTER — Telehealth: Payer: Self-pay | Admitting: Cardiovascular Disease

## 2016-02-27 NOTE — Telephone Encounter (Signed)
°*  STAT* If patient is at the pharmacy, call can be transferred to refill team.   1. Which medications need to be refilled? (please list name of each medication and dose if known) Lorazapam  2. Which pharmacy/location (including street and city if local pharmacy) is medication to be sent to?[phone]  3. Do they need a 30 day or 90 day supply? 30 and refills

## 2016-03-01 NOTE — Telephone Encounter (Signed)
I don't fill opiates or benzos.  Please have him follow up with whomever prescribed this last.

## 2016-03-05 NOTE — Telephone Encounter (Signed)
Left message with [facility] and advised patient

## 2016-03-15 ENCOUNTER — Other Ambulatory Visit: Payer: Self-pay | Admitting: *Deleted

## 2016-03-15 ENCOUNTER — Ambulatory Visit: Payer: Medicare Other | Admitting: Cardiovascular Disease

## 2016-03-15 NOTE — Telephone Encounter (Signed)
Received a refill request from [facility] on Lorazepam Denied by Dr Duke Salviaandolph Left message with [facility] patient needs to get from PCP

## 2016-03-19 ENCOUNTER — Ambulatory Visit: Payer: Medicare Other | Admitting: Cardiovascular Disease

## 2016-03-20 ENCOUNTER — Encounter: Payer: Self-pay | Admitting: *Deleted

## 2016-04-03 ENCOUNTER — Other Ambulatory Visit: Payer: Self-pay | Admitting: Family Medicine

## 2016-04-03 DIAGNOSIS — R51 Headache: Principal | ICD-10-CM

## 2016-04-03 DIAGNOSIS — R519 Headache, unspecified: Secondary | ICD-10-CM

## 2016-04-11 ENCOUNTER — Ambulatory Visit
Admission: RE | Admit: 2016-04-11 | Discharge: 2016-04-11 | Disposition: A | Discharge status: Home / Self Care | Payer: Medicare Other | Source: Ambulatory Visit | Attending: Family Medicine | Admitting: Family Medicine

## 2016-04-11 DIAGNOSIS — R51 Headache: Principal | ICD-10-CM

## 2016-04-11 DIAGNOSIS — R519 Headache, unspecified: Secondary | ICD-10-CM

## 2016-04-11 MED ORDER — IOPAMIDOL (ISOVUE-300) INJECTION 61%
75.0000 mL | Freq: Once | INTRAVENOUS | Status: AC | PRN
  Administered 2016-04-11: 75 mL via INTRAVENOUS

## 2016-05-06 ENCOUNTER — Emergency Department (HOSPITAL_COMMUNITY): Payer: Medicare Other

## 2016-05-06 ENCOUNTER — Inpatient Hospital Stay (HOSPITAL_COMMUNITY): Payer: Medicare Other

## 2016-05-06 ENCOUNTER — Inpatient Hospital Stay (HOSPITAL_COMMUNITY)
Admission: EM | Admit: 2016-05-06 | Discharge: 2016-05-19 | DRG: 871 | Disposition: A | Discharge status: Skilled Nursing Facility | Payer: Medicare Other | Attending: Internal Medicine | Admitting: Internal Medicine

## 2016-05-06 ENCOUNTER — Encounter (HOSPITAL_COMMUNITY): Payer: Self-pay | Admitting: Emergency Medicine

## 2016-05-06 DIAGNOSIS — G9340 Encephalopathy, unspecified: Secondary | ICD-10-CM | POA: Diagnosis present

## 2016-05-06 DIAGNOSIS — E611 Iron deficiency: Secondary | ICD-10-CM | POA: Diagnosis present

## 2016-05-06 DIAGNOSIS — F17210 Nicotine dependence, cigarettes, uncomplicated: Secondary | ICD-10-CM | POA: Diagnosis present

## 2016-05-06 DIAGNOSIS — E039 Hypothyroidism, unspecified: Secondary | ICD-10-CM | POA: Diagnosis present

## 2016-05-06 DIAGNOSIS — J9601 Acute respiratory failure with hypoxia: Secondary | ICD-10-CM | POA: Diagnosis present

## 2016-05-06 DIAGNOSIS — H8110 Benign paroxysmal vertigo, unspecified ear: Secondary | ICD-10-CM | POA: Diagnosis present

## 2016-05-06 DIAGNOSIS — N189 Chronic kidney disease, unspecified: Secondary | ICD-10-CM

## 2016-05-06 DIAGNOSIS — N183 Chronic kidney disease, stage 3 unspecified: Secondary | ICD-10-CM

## 2016-05-06 DIAGNOSIS — E876 Hypokalemia: Secondary | ICD-10-CM | POA: Diagnosis present

## 2016-05-06 DIAGNOSIS — N179 Acute kidney failure, unspecified: Secondary | ICD-10-CM | POA: Diagnosis not present

## 2016-05-06 DIAGNOSIS — E46 Unspecified protein-calorie malnutrition: Secondary | ICD-10-CM | POA: Diagnosis present

## 2016-05-06 DIAGNOSIS — R6521 Severe sepsis with septic shock: Secondary | ICD-10-CM | POA: Diagnosis not present

## 2016-05-06 DIAGNOSIS — K219 Gastro-esophageal reflux disease without esophagitis: Secondary | ICD-10-CM | POA: Diagnosis present

## 2016-05-06 DIAGNOSIS — Z79899 Other long term (current) drug therapy: Secondary | ICD-10-CM | POA: Diagnosis not present

## 2016-05-06 DIAGNOSIS — E1011 Type 1 diabetes mellitus with ketoacidosis with coma: Secondary | ICD-10-CM | POA: Diagnosis present

## 2016-05-06 DIAGNOSIS — F1020 Alcohol dependence, uncomplicated: Secondary | ICD-10-CM | POA: Diagnosis present

## 2016-05-06 DIAGNOSIS — N170 Acute kidney failure with tubular necrosis: Secondary | ICD-10-CM | POA: Diagnosis present

## 2016-05-06 DIAGNOSIS — J9602 Acute respiratory failure with hypercapnia: Secondary | ICD-10-CM | POA: Diagnosis present

## 2016-05-06 DIAGNOSIS — Z885 Allergy status to narcotic agent status: Secondary | ICD-10-CM

## 2016-05-06 DIAGNOSIS — I639 Cerebral infarction, unspecified: Secondary | ICD-10-CM | POA: Diagnosis not present

## 2016-05-06 DIAGNOSIS — F411 Generalized anxiety disorder: Secondary | ICD-10-CM | POA: Diagnosis not present

## 2016-05-06 DIAGNOSIS — R197 Diarrhea, unspecified: Secondary | ICD-10-CM | POA: Diagnosis not present

## 2016-05-06 DIAGNOSIS — E1311 Other specified diabetes mellitus with ketoacidosis with coma: Secondary | ICD-10-CM | POA: Diagnosis not present

## 2016-05-06 DIAGNOSIS — R1110 Vomiting, unspecified: Secondary | ICD-10-CM

## 2016-05-06 DIAGNOSIS — D649 Anemia, unspecified: Secondary | ICD-10-CM | POA: Diagnosis present

## 2016-05-06 DIAGNOSIS — R42 Dizziness and giddiness: Secondary | ICD-10-CM

## 2016-05-06 DIAGNOSIS — R0600 Dyspnea, unspecified: Secondary | ICD-10-CM | POA: Diagnosis not present

## 2016-05-06 DIAGNOSIS — E1022 Type 1 diabetes mellitus with diabetic chronic kidney disease: Secondary | ICD-10-CM

## 2016-05-06 DIAGNOSIS — J9600 Acute respiratory failure, unspecified whether with hypoxia or hypercapnia: Secondary | ICD-10-CM | POA: Diagnosis present

## 2016-05-06 DIAGNOSIS — D638 Anemia in other chronic diseases classified elsewhere: Secondary | ICD-10-CM | POA: Diagnosis present

## 2016-05-06 DIAGNOSIS — F319 Bipolar disorder, unspecified: Secondary | ICD-10-CM | POA: Diagnosis present

## 2016-05-06 DIAGNOSIS — G43909 Migraine, unspecified, not intractable, without status migrainosus: Secondary | ICD-10-CM | POA: Diagnosis present

## 2016-05-06 DIAGNOSIS — Z681 Body mass index (BMI) 19 or less, adult: Secondary | ICD-10-CM

## 2016-05-06 DIAGNOSIS — J449 Chronic obstructive pulmonary disease, unspecified: Secondary | ICD-10-CM | POA: Diagnosis present

## 2016-05-06 DIAGNOSIS — E872 Acidosis, unspecified: Secondary | ICD-10-CM

## 2016-05-06 DIAGNOSIS — Z794 Long term (current) use of insulin: Secondary | ICD-10-CM | POA: Diagnosis not present

## 2016-05-06 DIAGNOSIS — I129 Hypertensive chronic kidney disease with stage 1 through stage 4 chronic kidney disease, or unspecified chronic kidney disease: Secondary | ICD-10-CM | POA: Diagnosis present

## 2016-05-06 DIAGNOSIS — G459 Transient cerebral ischemic attack, unspecified: Secondary | ICD-10-CM | POA: Diagnosis not present

## 2016-05-06 DIAGNOSIS — Z9289 Personal history of other medical treatment: Secondary | ICD-10-CM | POA: Diagnosis not present

## 2016-05-06 DIAGNOSIS — H53462 Homonymous bilateral field defects, left side: Secondary | ICD-10-CM | POA: Diagnosis present

## 2016-05-06 DIAGNOSIS — E875 Hyperkalemia: Secondary | ICD-10-CM | POA: Diagnosis present

## 2016-05-06 DIAGNOSIS — I6330 Cerebral infarction due to thrombosis of unspecified cerebral artery: Secondary | ICD-10-CM | POA: Diagnosis not present

## 2016-05-06 DIAGNOSIS — Z9109 Other allergy status, other than to drugs and biological substances: Secondary | ICD-10-CM

## 2016-05-06 DIAGNOSIS — Z881 Allergy status to other antibiotic agents status: Secondary | ICD-10-CM

## 2016-05-06 DIAGNOSIS — E1010 Type 1 diabetes mellitus with ketoacidosis without coma: Secondary | ICD-10-CM | POA: Diagnosis present

## 2016-05-06 DIAGNOSIS — E1043 Type 1 diabetes mellitus with diabetic autonomic (poly)neuropathy: Secondary | ICD-10-CM | POA: Diagnosis present

## 2016-05-06 DIAGNOSIS — J9690 Respiratory failure, unspecified, unspecified whether with hypoxia or hypercapnia: Secondary | ICD-10-CM

## 2016-05-06 DIAGNOSIS — A419 Sepsis, unspecified organism: Secondary | ICD-10-CM | POA: Diagnosis present

## 2016-05-06 DIAGNOSIS — H5702 Anisocoria: Secondary | ICD-10-CM

## 2016-05-06 LAB — I-STAT ARTERIAL BLOOD GAS, ED
ACID-BASE DEFICIT: 24 mmol/L — AB (ref 0.0–2.0)
BICARBONATE: 6.5 meq/L — AB (ref 20.0–24.0)
O2 SAT: 100 %
PO2 ART: 572 mmHg — AB (ref 80.0–100.0)
Patient temperature: 96.4
TCO2: 7 mmol/L (ref 0–100)
pCO2 arterial: 27.4 mmHg — ABNORMAL LOW (ref 35.0–45.0)
pH, Arterial: 6.974 — CL (ref 7.350–7.450)

## 2016-05-06 LAB — CBC
HEMATOCRIT: 30.6 % — AB (ref 36.0–46.0)
Hemoglobin: 9.5 g/dL — ABNORMAL LOW (ref 12.0–15.0)
MCH: 31.6 pg (ref 26.0–34.0)
MCHC: 31 g/dL (ref 30.0–36.0)
MCV: 101.7 fL — AB (ref 78.0–100.0)
PLATELETS: 235 10*3/uL (ref 150–400)
RBC: 3.01 MIL/uL — ABNORMAL LOW (ref 3.87–5.11)
RDW: 13.2 % (ref 11.5–15.5)
WBC: 22.8 10*3/uL — AB (ref 4.0–10.5)

## 2016-05-06 LAB — URINE MICROSCOPIC-ADD ON

## 2016-05-06 LAB — POCT I-STAT 3, ART BLOOD GAS (G3+)
ACID-BASE DEFICIT: 25 mmol/L — AB (ref 0.0–2.0)
ACID-BASE DEFICIT: 20 mmol/L — AB (ref 0.0–2.0)
ACID-BASE DEFICIT: 18 mmol/L — AB (ref 0.0–2.0)
BICARBONATE: 8.2 meq/L — AB (ref 20.0–24.0)
Bicarbonate: 5.6 mEq/L — ABNORMAL LOW (ref 20.0–24.0)
Bicarbonate: 8.2 mEq/L — ABNORMAL LOW (ref 20.0–24.0)
O2 SAT: 99 %
O2 SAT: 99 %
O2 SAT: 99 %
PCO2 ART: 22 mmHg — AB (ref 35.0–45.0)
PCO2 ART: 24.8 mmHg — AB (ref 35.0–45.0)
PH ART: 6.989 — AB (ref 7.350–7.450)
PH ART: 7.187 — AB (ref 7.350–7.450)
PO2 ART: 179 mmHg — AB (ref 80.0–100.0)
PO2 ART: 168 mmHg — AB (ref 80.0–100.0)
Patient temperature: 93.9
TCO2: 6 mmol/L (ref 0–100)
TCO2: 9 mmol/L (ref 0–100)
TCO2: 9 mmol/L (ref 0–100)
pCO2 arterial: 21.6 mmHg — ABNORMAL LOW (ref 35.0–45.0)
pH, Arterial: 7.111 — CL (ref 7.350–7.450)
pO2, Arterial: 204 mmHg — ABNORMAL HIGH (ref 80.0–100.0)

## 2016-05-06 LAB — GLUCOSE, CAPILLARY
GLUCOSE-CAPILLARY: 432 mg/dL — AB (ref 65–99)
GLUCOSE-CAPILLARY: 390 mg/dL — AB (ref 65–99)
GLUCOSE-CAPILLARY: 366 mg/dL — AB (ref 65–99)
GLUCOSE-CAPILLARY: 325 mg/dL — AB (ref 65–99)
Glucose-Capillary: 407 mg/dL — ABNORMAL HIGH (ref 65–99)
Glucose-Capillary: 386 mg/dL — ABNORMAL HIGH (ref 65–99)
Glucose-Capillary: 308 mg/dL — ABNORMAL HIGH (ref 65–99)

## 2016-05-06 LAB — CBC WITH DIFFERENTIAL/PLATELET
Basophils Absolute: 0.1 10*3/uL (ref 0.0–0.1)
Basophils Relative: 0 %
EOS ABS: 0 10*3/uL (ref 0.0–0.7)
Eosinophils Relative: 0 %
HEMATOCRIT: 34.8 % — AB (ref 36.0–46.0)
HEMOGLOBIN: 10.6 g/dL — AB (ref 12.0–15.0)
Lymphocytes Relative: 13 %
Lymphs Abs: 2.7 10*3/uL (ref 0.7–4.0)
MCH: 31.9 pg (ref 26.0–34.0)
MCHC: 30.5 g/dL (ref 30.0–36.0)
MCV: 104.8 fL — ABNORMAL HIGH (ref 78.0–100.0)
MONO ABS: 1.3 10*3/uL — AB (ref 0.1–1.0)
Monocytes Relative: 5 %
NEUTROS ABS: 18.6 10*3/uL — AB (ref 1.7–7.7)
Neutrophils Relative %: 82 %
Platelets: 252 10*3/uL (ref 150–400)
RBC: 3.32 MIL/uL — AB (ref 3.87–5.11)
RDW: 13.4 % (ref 11.5–15.5)
WBC: 22.8 10*3/uL — AB (ref 4.0–10.5)

## 2016-05-06 LAB — RAPID URINE DRUG SCREEN, HOSP PERFORMED
AMPHETAMINES: NOT DETECTED
BENZODIAZEPINES: NOT DETECTED
Barbiturates: NOT DETECTED
Cocaine: NOT DETECTED
Opiates: NOT DETECTED
Tetrahydrocannabinol: NOT DETECTED

## 2016-05-06 LAB — BASIC METABOLIC PANEL
Anion gap: 26 — ABNORMAL HIGH (ref 5–15)
BUN: 46 mg/dL — AB (ref 6–20)
BUN: 40 mg/dL — ABNORMAL HIGH (ref 6–20)
CALCIUM: 7 mg/dL — AB (ref 8.9–10.3)
CHLORIDE: 107 mmol/L (ref 101–111)
CO2: <7 mmol/L — ABNORMAL LOW (ref 22–32)
CO2: 12 mmol/L — ABNORMAL LOW (ref 22–32)
CREATININE: 3.88 mg/dL — AB (ref 0.44–1.00)
CREATININE: 3.73 mg/dL — AB (ref 0.44–1.00)
Calcium: 7.4 mg/dL — ABNORMAL LOW (ref 8.9–10.3)
Chloride: 107 mmol/L (ref 101–111)
GFR calc Af Amer: 13 mL/min — ABNORMAL LOW (ref >60)
GFR calc non Af Amer: 12 mL/min — ABNORMAL LOW (ref >60)
GFR, EST AFRICAN AMERICAN: 13 mL/min — AB (ref >60)
GFR, EST NON AFRICAN AMERICAN: 11 mL/min — AB (ref >60)
GLUCOSE: 465 mg/dL — AB (ref 65–99)
Glucose, Bld: 365 mg/dL — ABNORMAL HIGH (ref 65–99)
Potassium: 4.4 mmol/L (ref 3.5–5.1)
Potassium: 2.7 mmol/L — CL (ref 3.5–5.1)
SODIUM: 139 mmol/L (ref 135–145)
SODIUM: 145 mmol/L (ref 135–145)

## 2016-05-06 LAB — URINALYSIS, ROUTINE W REFLEX MICROSCOPIC
Bilirubin Urine: NEGATIVE
GLUCOSE, UA: 250 mg/dL — AB
Ketones, ur: 15 mg/dL — AB
LEUKOCYTES UA: NEGATIVE
Nitrite: NEGATIVE
PH: 5 (ref 5.0–8.0)
Specific Gravity, Urine: 1.033 — ABNORMAL HIGH (ref 1.005–1.030)

## 2016-05-06 LAB — COMPREHENSIVE METABOLIC PANEL
ALBUMIN: 3.3 g/dL — AB (ref 3.5–5.0)
ALK PHOS: 50 U/L (ref 38–126)
ALT: 15 U/L (ref 14–54)
AST: 34 U/L (ref 15–41)
BILIRUBIN TOTAL: 1.6 mg/dL — AB (ref 0.3–1.2)
BUN: 50 mg/dL — AB (ref 6–20)
CO2: <7 mmol/L — ABNORMAL LOW (ref 22–32)
CREATININE: 4.17 mg/dL — AB (ref 0.44–1.00)
Calcium: 8.8 mg/dL — ABNORMAL LOW (ref 8.9–10.3)
Chloride: 97 mmol/L — ABNORMAL LOW (ref 101–111)
GFR calc Af Amer: 12 mL/min — ABNORMAL LOW (ref >60)
GFR calc non Af Amer: 10 mL/min — ABNORMAL LOW (ref >60)
GLUCOSE: 655 mg/dL — AB (ref 65–99)
Potassium: 6.6 mmol/L (ref 3.5–5.1)
Sodium: 135 mmol/L (ref 135–145)
Total Protein: 5.6 g/dL — ABNORMAL LOW (ref 6.5–8.1)

## 2016-05-06 LAB — CBG MONITORING, ED
GLUCOSE-CAPILLARY: 516 mg/dL — AB (ref 65–99)
Glucose-Capillary: >600 mg/dL (ref 65–99)

## 2016-05-06 LAB — I-STAT VENOUS BLOOD GAS, ED
Acid-base deficit: 27 mmol/L — ABNORMAL HIGH (ref 0.0–2.0)
BICARBONATE: 4.8 meq/L — AB (ref 20.0–24.0)
O2 Saturation: 81 %
PCO2 VEN: 24.4 mmHg — AB (ref 45.0–50.0)
PO2 VEN: 74 mmHg — AB (ref 31.0–45.0)
TCO2: 5 mmol/L (ref 0–100)
pH, Ven: 6.898 — CL (ref 7.250–7.300)

## 2016-05-06 LAB — LACTIC ACID, PLASMA
LACTIC ACID, VENOUS: 2.3 mmol/L — AB (ref 0.5–2.0)
Lactic Acid, Venous: 2.2 mmol/L (ref 0.5–2.0)

## 2016-05-06 LAB — MRSA PCR SCREENING: MRSA BY PCR: NEGATIVE

## 2016-05-06 LAB — MAGNESIUM
Magnesium: 2.6 mg/dL — ABNORMAL HIGH (ref 1.7–2.4)
Magnesium: 2.2 mg/dL (ref 1.7–2.4)

## 2016-05-06 LAB — I-STAT CG4 LACTIC ACID, ED: Lactic Acid, Venous: 1.63 mmol/L (ref 0.5–2.0)

## 2016-05-06 LAB — I-STAT TROPONIN, ED: Troponin i, poc: 0.46 ng/mL (ref 0.00–0.08)

## 2016-05-06 LAB — T4, FREE: Free T4: 0.42 ng/dL — ABNORMAL LOW (ref 0.61–1.12)

## 2016-05-06 LAB — PROTIME-INR
INR: 1.13 (ref 0.00–1.49)
Prothrombin Time: 14.7 seconds (ref 11.6–15.2)

## 2016-05-06 LAB — TSH: TSH: 1.873 u[IU]/mL (ref 0.350–4.500)

## 2016-05-06 LAB — AMMONIA: AMMONIA: 61 umol/L — AB (ref 9–35)

## 2016-05-06 LAB — PHOSPHORUS: Phosphorus: 10.4 mg/dL — ABNORMAL HIGH (ref 2.5–4.6)

## 2016-05-06 LAB — PROCALCITONIN: PROCALCITONIN: 0.69 ng/mL

## 2016-05-06 LAB — ETHANOL

## 2016-05-06 MED ORDER — FOLIC ACID 5 MG/ML IJ SOLN
1.0000 mg | Freq: Every day | INTRAMUSCULAR | Status: DC
  Administered 2016-05-06 – 2016-05-11 (×6): 1 mg via INTRAVENOUS
  Filled 2016-05-06 (×7): qty 0.2

## 2016-05-06 MED ORDER — SODIUM CHLORIDE 0.9 % IV BOLUS (SEPSIS)
500.0000 mL | Freq: Once | INTRAVENOUS | Status: AC
  Administered 2016-05-06: 500 mL via INTRAVENOUS

## 2016-05-06 MED ORDER — FENTANYL CITRATE (PF) 100 MCG/2ML IJ SOLN
50.0000 ug | Freq: Once | INTRAMUSCULAR | Status: AC
  Administered 2016-05-06: 50 ug via INTRAVENOUS
  Filled 2016-05-06: qty 2

## 2016-05-06 MED ORDER — SODIUM CHLORIDE 0.9 % IV SOLN
INTRAVENOUS | Status: DC
  Administered 2016-05-06: 17:00:00 via INTRAVENOUS

## 2016-05-06 MED ORDER — DEXTROSE-NACL 5-0.45 % IV SOLN: INTRAVENOUS | Status: DC

## 2016-05-06 MED ORDER — POTASSIUM CHLORIDE 10 MEQ/100ML IV SOLN: 10.0000 meq | INTRAVENOUS | Status: DC

## 2016-05-06 MED ORDER — SODIUM CHLORIDE 0.9 % IV SOLN
INTRAVENOUS | Status: DC
  Administered 2016-05-06: 5.4 [IU]/h via INTRAVENOUS
  Filled 2016-05-06: qty 2.5

## 2016-05-06 MED ORDER — SODIUM CHLORIDE 0.9 % IV SOLN: INTRAVENOUS | Status: DC

## 2016-05-06 MED ORDER — PIPERACILLIN-TAZOBACTAM IN DEX 2-0.25 GM/50ML IV SOLN
2.2500 g | Freq: Three times a day (TID) | INTRAVENOUS | Status: DC
  Administered 2016-05-06 – 2016-05-12 (×17): 2.25 g via INTRAVENOUS
  Filled 2016-05-06 (×21): qty 50

## 2016-05-06 MED ORDER — CHLORHEXIDINE GLUCONATE 0.12% ORAL RINSE (MEDLINE KIT)
15.0000 mL | Freq: Two times a day (BID) | OROMUCOSAL | Status: DC
  Administered 2016-05-06: 15 mL via OROMUCOSAL

## 2016-05-06 MED ORDER — SODIUM CHLORIDE 0.9 % IV BOLUS (SEPSIS)
1000.0000 mL | Freq: Once | INTRAVENOUS | Status: AC
  Administered 2016-05-06: 1000 mL via INTRAVENOUS

## 2016-05-06 MED ORDER — NOREPINEPHRINE BITARTRATE 1 MG/ML IV SOLN
0.0000 ug/min | INTRAVENOUS | Status: DC
  Administered 2016-05-06: 5 ug/min via INTRAVENOUS
  Filled 2016-05-06: qty 4

## 2016-05-06 MED ORDER — SODIUM CHLORIDE 0.9 % IV SOLN
1.0000 g | Freq: Once | INTRAVENOUS | Status: AC
  Administered 2016-05-06: 1 g via INTRAVENOUS
  Filled 2016-05-06: qty 10

## 2016-05-06 MED ORDER — SODIUM BICARBONATE 8.4 % IV SOLN
100.0000 meq | Freq: Once | INTRAVENOUS | Status: AC
  Administered 2016-05-06: 100 meq via INTRAVENOUS

## 2016-05-06 MED ORDER — ETOMIDATE 2 MG/ML IV SOLN
INTRAVENOUS | Status: DC | PRN
  Administered 2016-05-06: 20 mg via INTRAVENOUS

## 2016-05-06 MED ORDER — STERILE WATER FOR INJECTION IV SOLN
INTRAVENOUS | Status: DC
  Administered 2016-05-06: 16:00:00 via INTRAVENOUS
  Filled 2016-05-06 (×2): qty 850

## 2016-05-06 MED ORDER — SODIUM CHLORIDE 0.9 % IV SOLN
INTRAVENOUS | Status: DC
  Administered 2016-05-06: 13:00:00 via INTRAVENOUS

## 2016-05-06 MED ORDER — HEPARIN SODIUM (PORCINE) 5000 UNIT/ML IJ SOLN: 5000.0000 [IU] | Freq: Three times a day (TID) | INTRAMUSCULAR | Status: DC

## 2016-05-06 MED ORDER — SODIUM BICARBONATE 8.4 % IV SOLN
INTRAVENOUS | Status: AC
  Filled 2016-05-06: qty 100

## 2016-05-06 MED ORDER — HEPARIN SODIUM (PORCINE) 5000 UNIT/ML IJ SOLN
5000.0000 [IU] | Freq: Three times a day (TID) | INTRAMUSCULAR | Status: DC
  Administered 2016-05-06 – 2016-05-19 (×39): 5000 [IU] via SUBCUTANEOUS
  Filled 2016-05-06 (×38): qty 1

## 2016-05-06 MED ORDER — DEXTROSE-NACL 5-0.45 % IV SOLN
INTRAVENOUS | Status: DC
  Administered 2016-05-07: 75 mL/h via INTRAVENOUS
  Administered 2016-05-07 – 2016-05-10 (×5): via INTRAVENOUS

## 2016-05-06 MED ORDER — SODIUM CHLORIDE 0.9 % IV BOLUS (SEPSIS): 500.0000 mL | Freq: Once | INTRAVENOUS | Status: DC

## 2016-05-06 MED ORDER — SODIUM CHLORIDE 0.9 % IV SOLN
INTRAVENOUS | Status: DC
  Filled 2016-05-06: qty 2.5

## 2016-05-06 MED ORDER — ROCURONIUM BROMIDE 50 MG/5ML IV SOLN
INTRAVENOUS | Status: DC | PRN
  Administered 2016-05-06: 50 mg via INTRAVENOUS

## 2016-05-06 MED ORDER — LORAZEPAM 2 MG/ML IJ SOLN
1.0000 mg | Freq: Once | INTRAMUSCULAR | Status: AC
  Administered 2016-05-06: 1 mg via INTRAVENOUS
  Filled 2016-05-06: qty 1

## 2016-05-06 MED ORDER — DEXTROSE IN LACTATED RINGERS 5 % IV SOLN
Freq: Once | INTRAVENOUS | Status: AC
  Administered 2016-05-06: 16:00:00 via INTRAVENOUS
  Filled 2016-05-06: qty 10

## 2016-05-06 MED ORDER — MIDAZOLAM HCL 2 MG/2ML IJ SOLN
1.0000 mg | INTRAMUSCULAR | Status: DC | PRN
  Administered 2016-05-06 (×2): 1 mg via INTRAVENOUS
  Filled 2016-05-06 (×3): qty 2

## 2016-05-06 MED ORDER — POTASSIUM CHLORIDE 10 MEQ/50ML IV SOLN
10.0000 meq | INTRAVENOUS | Status: AC
  Administered 2016-05-06 – 2016-05-07 (×4): 10 meq via INTRAVENOUS
  Filled 2016-05-06 (×3): qty 50

## 2016-05-06 MED ORDER — SODIUM CHLORIDE 0.9 % IV SOLN
0.0000 ug/h | INTRAVENOUS | Status: DC
  Administered 2016-05-06 (×2): 50 ug/h via INTRAVENOUS
  Administered 2016-05-07: 150 ug/h via INTRAVENOUS
  Administered 2016-05-08: 175 ug/h via INTRAVENOUS
  Filled 2016-05-06 (×5): qty 50

## 2016-05-06 MED ORDER — PIPERACILLIN-TAZOBACTAM 3.375 G IVPB 30 MIN
3.3750 g | Freq: Once | INTRAVENOUS | Status: AC
  Administered 2016-05-06: 3.375 g via INTRAVENOUS
  Filled 2016-05-06: qty 50

## 2016-05-06 MED ORDER — SODIUM BICARBONATE 8.4 % IV SOLN: 100.0000 meq | Freq: Once | INTRAVENOUS | Status: DC

## 2016-05-06 MED ORDER — VASOPRESSIN 20 UNIT/ML IV SOLN
0.0300 [IU]/min | INTRAVENOUS | Status: DC
  Filled 2016-05-06: qty 2

## 2016-05-06 MED ORDER — ANTISEPTIC ORAL RINSE SOLUTION (CORINZ)
7.0000 mL | Freq: Four times a day (QID) | OROMUCOSAL | Status: DC
  Administered 2016-05-07 (×2): 7 mL via OROMUCOSAL

## 2016-05-06 MED ORDER — FENTANYL BOLUS VIA INFUSION
25.0000 ug | INTRAVENOUS | Status: DC | PRN
  Administered 2016-05-07 – 2016-05-08 (×5): 25 ug via INTRAVENOUS
  Filled 2016-05-06: qty 25

## 2016-05-06 MED ORDER — ASPIRIN 300 MG RE SUPP: 300.0000 mg | RECTAL | Status: AC

## 2016-05-06 MED ORDER — PANTOPRAZOLE SODIUM 40 MG IV SOLR
40.0000 mg | Freq: Two times a day (BID) | INTRAVENOUS | Status: DC
  Administered 2016-05-06 – 2016-05-12 (×13): 40 mg via INTRAVENOUS
  Filled 2016-05-06 (×13): qty 40

## 2016-05-06 MED ORDER — MIDAZOLAM HCL 2 MG/2ML IJ SOLN
1.0000 mg | INTRAMUSCULAR | Status: DC | PRN
  Administered 2016-05-07 – 2016-05-09 (×7): 1 mg via INTRAVENOUS
  Filled 2016-05-06 (×7): qty 2

## 2016-05-06 MED ORDER — THIAMINE HCL 100 MG/ML IJ SOLN
100.0000 mg | Freq: Every day | INTRAMUSCULAR | Status: DC
  Administered 2016-05-06 – 2016-05-11 (×6): 100 mg via INTRAVENOUS
  Filled 2016-05-06 (×6): qty 2

## 2016-05-06 MED ORDER — VANCOMYCIN HCL IN DEXTROSE 1-5 GM/200ML-% IV SOLN
1000.0000 mg | Freq: Once | INTRAVENOUS | Status: AC
  Administered 2016-05-06: 1000 mg via INTRAVENOUS
  Filled 2016-05-06: qty 200

## 2016-05-06 MED ORDER — ASPIRIN 81 MG PO CHEW
324.0000 mg | CHEWABLE_TABLET | ORAL | Status: AC
  Administered 2016-05-06: 324 mg via ORAL
  Filled 2016-05-06: qty 4

## 2016-05-06 MED ORDER — VANCOMYCIN HCL 500 MG IV SOLR
500.0000 mg | INTRAVENOUS | Status: DC
  Administered 2016-05-08: 500 mg via INTRAVENOUS
  Filled 2016-05-06: qty 500

## 2016-05-06 MED ORDER — SODIUM BICARBONATE 8.4 % IV SOLN
INTRAVENOUS | Status: DC | PRN
  Administered 2016-05-06 (×2): 50 meq via INTRAVENOUS

## 2016-05-06 MED ORDER — NOREPINEPHRINE BITARTRATE 1 MG/ML IV SOLN
0.0000 ug/min | INTRAVENOUS | Status: DC
  Administered 2016-05-06: 10 ug/min via INTRAVENOUS
  Administered 2016-05-08: 6 ug/min via INTRAVENOUS
  Filled 2016-05-06 (×2): qty 16

## 2016-05-06 MED ORDER — LORAZEPAM 2 MG/ML IJ SOLN
0.5000 mg | Freq: Once | INTRAMUSCULAR | Status: AC
  Administered 2016-05-06: 0.5 mg via INTRAVENOUS
  Filled 2016-05-06: qty 1

## 2016-05-06 MED ORDER — SODIUM BICARBONATE 8.4 % IV SOLN
INTRAVENOUS | Status: AC
  Filled 2016-05-06: qty 50

## 2016-05-06 MED ORDER — POTASSIUM CHLORIDE 10 MEQ/50ML IV SOLN
INTRAVENOUS | Status: AC
  Administered 2016-05-06: 10 meq via INTRAVENOUS
  Filled 2016-05-06: qty 50

## 2016-05-06 NOTE — Progress Notes (Signed)
eLink Physician-Brief Progress Note Patient Name: Cassandra [name] DOB: 1946-12-07 MRN: [id number]   Date of Service  05/06/2016  HPI/Events of Note  Multiple issues: 1. K+ = 2.7 and Creatinine = 3.73 and Oliguria - urine output 20 mL last hour. CVP = 9.  eICU Interventions  Will order: 1. Replete K+. 2. Bolus with 0.9 NaCl 1 liter IV over 1 hour now.      Intervention Category Intermediate Interventions: Electrolyte abnormality - evaluation and management;Oliguria - evaluation and management  [name] 05/06/2016, 9:09 PM

## 2016-05-06 NOTE — Progress Notes (Signed)
eLink Physician-Brief Progress Note Patient Name: Cassandra AbbeBillie A [name] DOB: 1946/06/22 MRN: [id number]   Date of Service  05/06/2016  HPI/Events of Note  Hypotension - BP = 69/43. CVP = 14. Last Hgb at 4 PM = 9.5. ABG on 40%/PRVC 35/TV 480/P 5 = 7.18/21.6/169/8.2.   eICU Interventions  Will order: 1. NaHCO3 100 meq IV now.  2. Titrate Norepinephrine IV infusion up. 3. Start Vasopressin IV infusion.      Intervention Category Major Interventions: Acid-Base disturbance - evaluation and management;Hypotension - evaluation and management  Lenell AntuSommer,Zetta Stoneman Eugene 05/06/2016, 6:40 PM

## 2016-05-06 NOTE — ED Notes (Signed)
Lab called critical values potassium 6.6 and glucose 655 reported to EDP.

## 2016-05-06 NOTE — Progress Notes (Signed)
CRITICAL VALUE ALERT  Critical value received:  Lactic Acid = 2.3  Date of notification:  05/06/2016  Time of notification:  8:13 PM  Critical value read back:Yes.    Nurse who received alert:  Wilhelmina McardleLauren Ameya Kutz, RN  MD notified (1st page):  Dr. Arsenio LoaderSommer  Time of first page:  8:14 PM  MD notified (2nd page):  Time of second page:  Responding MD:  Dr. Arsenio LoaderSommer  Time MD responded:  8:15 PM   Orders Received

## 2016-05-06 NOTE — Procedures (Signed)
Arterial Catheter Insertion Procedure Note Cassandra AbbeBillie A [name] [id number] 29-Sep-1946  Procedure: Insertion of Arterial Catheter  Indications: Blood pressure monitoring and Frequent blood sampling  Procedure Details Consent: Unable to obtain consent because of altered level of consciousness. Time Out: Verified patient identification, verified procedure, site/side was marked, verified correct patient position, special equipment/implants available, medications/allergies/relevent history reviewed, required imaging and test results available.  Performed  Maximum sterile technique was used including antiseptics, cap, gloves, gown, hand hygiene, mask and sheet. Skin prep: Chlorhexidine; local anesthetic administered 20 gauge catheter was inserted into right radial artery using the Seldinger technique.  Evaluation Blood flow good; BP tracing good. Complications: No apparent complications.   Cassandra BoundYACOUB,Cassandra [name] 05/06/2016

## 2016-05-06 NOTE — Progress Notes (Signed)
Pharmacy Code Sepsis Protocol  Time of code sepsis page: 1103 [x]  Antibiotics delivered at 1108   Were antibiotics ordered at the time of the code sepsis page? Yes Was it required to contact the physician? [x]  Physician not contacted []  Physician contacted to order antibiotics for code sepsis []  Physician contacted to recommend changing antibiotics  Pharmacy consulted for: vanc/zosyn  Anti-infectives    Start     Dose/Rate Route Frequency Ordered Stop   05/06/16 1115  vancomycin (VANCOCIN) IVPB 1000 mg/200 mL premix     1,000 mg 200 mL/hr over 60 Minutes Intravenous  Once 05/06/16 1100     05/06/16 1115  piperacillin-tazobactam (ZOSYN) IVPB 3.375 g     3.375 g 100 mL/hr over 30 Minutes Intravenous  Once 05/06/16 1100          Nurse education provided: [x]  Minutes left to administer antibiotics to achieve 1 hour goal [x]  Correct order of antibiotic administration [x]  Antibiotic Y-site compatibilities     Almon HerculesBaird, Cannen Dupras P, PharmD 05/06/2016, 11:11 AM

## 2016-05-06 NOTE — Progress Notes (Signed)
Post intubation ABG obtained on patient and given to MD.  Instructed to increase RR on ventilator.  Follow up ABG ordered.    Ref. Range 05/06/2016 14:17  Sample type Unknown ARTERIAL  pH, Arterial Latest Ref Range: 7.350-7.450  6.974 (LL)  pCO2 arterial Latest Ref Range: 35.0-45.0 mmHg 27.4 (L)  pO2, Arterial Latest Ref Range: 80.0-100.0 mmHg 572.0 (H)  Bicarbonate Latest Ref Range: 20.0-24.0 mEq/L 6.5 (L)  TCO2 Latest Ref Range: 0-100 mmol/L 7  Acid-base deficit Latest Ref Range: 0.0-2.0 mmol/L 24.0 (H)  O2 Saturation Latest Units: % 100.0  Patient temperature Unknown 96.4 F  Collection site Unknown ARTERIAL LINE

## 2016-05-06 NOTE — ED Notes (Signed)
Spouse [phone]

## 2016-05-06 NOTE — Progress Notes (Signed)
DC of head CT, neuro findings unchanged from ED.  Hermina BartersBOWMAN, Branden Vine M, RN

## 2016-05-06 NOTE — Progress Notes (Signed)
CRITICAL VALUE ALERT  Critical value received:  Lactic Acid = 2.2  Date of notification:  05/06/2016  Time of notification:  11:43 PM  Critical value read back:Yes.    Nurse who received alert:  Wilhelmina McardleLauren Alphonsine Minium, RN  MD notified (1st page):  eLink MD  Time of first page:  2343  MD notified (2nd page):  Time of second page:  Responding MD:  Dr. Vassie LollAlva  Time MD responded:  2350

## 2016-05-06 NOTE — Procedures (Signed)
Intubation Procedure Note Cassandra [name] [id number] June 14, 1946  Procedure: Intubation Indications: Respiratory insufficiency  Procedure Details Consent: Unable to obtain consent because of emergent medical necessity. Time Out: Verified patient identification, verified procedure, site/side was marked, verified correct patient position, special equipment/implants available, medications/allergies/relevent history reviewed, required imaging and test results available.  Performed  Maximum sterile technique was used including gloves, hand hygiene and mask.  MAC    Evaluation Hemodynamic Status: BP stable throughout; O2 sats: stable throughout Patient's Current Condition: stable Complications: No apparent complications Patient did tolerate procedure well. Chest X-ray ordered to verify placement.  CXR: tube position acceptable.   Cassandra [name] 05/06/2016

## 2016-05-06 NOTE — H&P (Signed)
PULMONARY / CRITICAL CARE MEDICINE   Name: Cassandra [name] MRN: [id number] DOB: [birth year]    ADMISSION DATE:  05/06/2016 CONSULTATION DATE:  05/06/2016  REFERRING MD:  Dr. Criss AlvineGoldston  CHIEF COMPLAINT:  DKA   HISTORY OF PRESENT ILLNESS:   This is a 70 y.o. female with PMH of Type 1 Diabetes mellitus, Hypothyroidism, Hypertension, CKD stage 3 (GFR 30-59 ml/min), Tobacco use, GERD, elevated liver enzymes (Hep B/C neg 2014).  She presents to the ER 05/06/2016 with altered mental status onset last night. On a good day pt is normally ambulatory and performs ADL's independently. Pt has been complaining of frequent migraines and vertigo for about the last year she is followed by ENT. Husband endorses a similar episode of confusion about 8 years ago, which turned out to be caused by a blood sugar of over 1200. Patient is also a chronic alcohol user, husband states pt drinks everyday throughout the day for the past 10-15 years, last alcoholic beverage per husband was 05/05/2016.  According to pts. husband pt began acting different around 5 or 6 pm 05/05/2016 with slight confusion.  Pt tossed and mumbled throughout the night. On 05/06/2016 she had persistent confusion and the husband was unsure as to what he should do. Husband then received a call from the patient's ENT physicians office notifying him that the patient missed an appointment this morning. When he told them what was going on, they advised him to call 911. Upon EMS arrival CBG reading "High," pts. last dose of insulin was Sunday morning 05/05/2016.  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  PAST SURGICAL HISTORY: She  has past surgical history that includes Appendectomy; Tonsillectomy; Cholecystectomy; Hernia repair; Cataract extraction; Abdominal hysterectomy; Bladder surgery; and Cervical disc surgery.  Allergies  Allergen Reactions  . Ciprofloxacin Other (See Comments)    No reaction specified  . Codeine Diarrhea and Nausea And Vomiting  . Doxycycline Diarrhea and Nausea And Vomiting  . Hydrocodone   . Omnicef [Cefdinir] Nausea Only and Other (See Comments)    constipation    No current facility-administered medications on file prior to encounter.   Current Outpatient Prescriptions on File Prior to Encounter  Medication Sig  . carvedilol (COREG) 12.5 MG tablet Take 1 tablet (12.5 mg total) by mouth 2 (two) times daily with a meal.  . cyanocobalamin (,VITAMIN B-12,) 1000 MCG/ML injection Inject 1,000 mcg into the muscle every 30 (thirty) days.  . hydrochlorothiazide (HYDRODIURIL) 25 MG tablet Take 1 tablet (25 mg total) by mouth daily.  . insulin lispro (HUMALOG) 100 UNIT/ML injection Inject into the skin once. Sliding scale as needed  . LANTUS 100 UNIT/ML injection Inject 10 Units into the skin every morning.   Marland Kitchen. levothyroxine (SYNTHROID, LEVOTHROID) 100 MCG tablet Take 1 tablet by mouth daily. Take only on Mon, Wed, Friday.  Marland Kitchen. LORazepam (ATIVAN) 0.5 MG tablet 0.5 mg once as needed.  Marland Kitchen. losartan (  COZAAR) 100 MG tablet Take 1 tablet (100 mg total) by mouth daily.  . meclizine (ANTIVERT) 25 MG tablet Take 1 tablet (25 mg total) by mouth 3 (three) times daily as needed for dizziness.  .  sertraline (ZOLOFT) 50 MG tablet Take 1.5 tablets (75 mg total) by mouth daily.  . sodium chloride (MURO 128) 5 % ophthalmic solution Reported on 01/17/2016    FAMILY HISTORY:  Her indicated that her mother is deceased. She indicated that her father is deceased. She indicated that her maternal grandmother is deceased. She indicated that her maternal grandfather is deceased. She indicated that her paternal grandmother is deceased. She indicated that her paternal grandfather is deceased. She indicated that both of her childs are alive.   SOCIAL HISTORY: She  reports that she has been smoking Cigarettes.  She has a 2.5 pack-year smoking history. She has never used smokeless tobacco. She reports that she drinks about 4.2 oz of alcohol per week. She reports that she does not use illicit drugs.  REVIEW OF SYSTEMS:   Unable to assess pt confused   SUBJECTIVE:  Pt confused and restless does not follow commands  VITAL SIGNS: BP 90/65 mmHg  Pulse 101  Temp(Src) 96.4 F (35.8 C) (Rectal)  Resp 25  Wt 104 lb (47.174 kg)  SpO2 93%  HEMODYNAMICS:    VENTILATOR SETTINGS:    INTAKE / OUTPUT:    PHYSICAL EXAMINATION: General:  Critically ill appearing caucasion female Neuro:  does not follow commands, bilateral pupils 3 mm equal and reactive HEENT:  Supple, no JVD Cardiovascular:  Sinus tach, s1s2, no murmur Lungs: rhonchi throughout, agonal respirations Abdomen: +BS x4, soft non distended, non tender Musculoskeletal:  Moves all extremities, normal bulk Skin:  intact  LABS:  BMET No results for input(s): NA, K, CL, CO2, BUN, CREATININE, GLUCOSE in the last 168 hours.  Electrolytes No results for input(s): CALCIUM, MG, PHOS in the last 168 hours.  CBC  Recent Labs Lab 05/06/16 1113  WBC PENDING  HGB 10.6*  HCT 34.8*  PLT PENDING    Coag's  Recent Labs Lab 05/06/16 1113  INR 1.13    Sepsis Markers  Recent Labs Lab 05/06/16 1126  LATICACIDVEN 1.63    ABG No  results for input(s): PHART, PCO2ART, PO2ART in the last 168 hours.  Liver Enzymes No results for input(s): AST, ALT, ALKPHOS, BILITOT, ALBUMIN in the last 168 hours.  Cardiac Enzymes No results for input(s): TROPONINI, PROBNP in the last 168 hours.  Glucose  Recent Labs Lab 05/06/16 1039  GLUCAP >600*    Imaging Dg Chest Portable 1 View  05/06/2016  CLINICAL DATA:  Sepsis.  Altered mental status EXAM: PORTABLE CHEST 1 VIEW COMPARISON:  04/20/2014 FINDINGS: The heart size and mediastinal contours are within normal limits. Both lungs are clear. The visualized skeletal structures are unremarkable. IMPRESSION: No active disease. Electronically Signed   By: Marlan Palau M.D.   On: 05/06/2016 11:44     STUDIES:  Ct of Head 5/22>>negative Urine drug screen 5/22>>negative   CULTURES: Blood 5/22>> Urine 5/22>>  ANTIBIOTICS: Vancomycin 5/22>> Zosyn 5/22>  SIGNIFICANT EVENTS: 5/22>>Pt arrived via EMS with AMS and elevated blood glucose level 5/22>>Pt intubated in ED secondary to acute hypoxic respiratory distress  LINES/TUBES: ETT>>5/22 Right IJ CVC>>5/22 R radial a-line 5/22>>>  DISCUSSION: This is a 70 y.o. female with PMH of Type 1 Diabetes mellitus, Hypothyroidism, Hypertension, CKD stage 3 (GFR 30-59 ml/min), Tobacco use, GERD, elevated liver enzymes (Hep B/C neg 2014).  She presents to the ER 05/06/2016 with altered mental status onset last night  ASSESSMENT / PLAN:  PULMONARY A: Acute hypoxic respiratory failure  P:   Full vent support Repeat ABG's  CXR in am Titrate O2 for sat of 88-92%  CARDIOVASCULAR A:  Hx: Hypertension with CKD-controlled P:  Serial troponin's Maintain map >65 Levophed for SBP support Follow CVP  RENAL A:   AKI (Hx: CKD baseline creatinine 1.10-1.50) Hyperkalemia P:   BMP's q4hrs while on insulin drip Replace electrolytes as indicated Monitor urinary output  GASTROINTESTINAL A:   Hx: GERD, Elevated liver enzymes (Hep  B/C neg 2004) P:   PPI for SUP Keep NPO for now  HEMATOLOGIC A:   No problems identified P:  Heparin/SCD for VTE prophylaxis    Monitor for s/s of bleeding Transfuse for HgB <7 Trend CBC's   INFECTIOUS A:   UTI P:   Continue abx as listed above Monitor cultures Trend WBC's and monitor fever curve Trend Procalcitonin's  ENDOCRINE A:   DKA P:   Initiate insulin drip BMP's q4hrs until anion gap closed and CO2 within normal limits Continue outpatient synthroid Continue NS   NEUROLOGIC A:   Acute encephalopathy Hx: Migraines, Bipolar Disorder, ETOH abuse, Benign paroxysmal positional vertigo P:   RASS goal: 0 to -1 Monitor s/sx of ETOH withdrawl Fentanyl drip  Hold outpatient ativan, zoloft Prn fentanyl and versed to maintain RASS goal Thiamine/folate/MVI  FAMILY  - Updates: Husband not at bedside will contact husband via telephone and update about plan of care  Sonda Rumble, [place]  Pulmonary/Critical Care  Attending Note:  70 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  Intubated for AMS and respiratory insufficiency.  Patient is obtunded and was intubated.  ABG noted and vent adjusted.  Above note edited in full.  Abx as ordered.  Thiamine/folate/MVI.  Hold psyc medications for now.  Minimal sedation.  Family updated, full code status.  The patient is critically ill with multiple organ systems failure and requires high complexity decision making for assessment and support, frequent evaluation and titration of therapies, application of advanced monitoring technologies and extensive interpretation of multiple databases.   Critical Care Time devoted to patient care services described in this note is  45  Minutes. This time reflects time of care of this signee Dr [name]. This critical care time does not reflect procedure time, or teaching time or supervisory time of PA/NP/Med student/Med Resident etc but could involve care discussion  time.  Alyson Reedy, M.D. [facility] Pulmonary/Critical Care Medicine. Pager: [phone]. After hours pager: [phone].

## 2016-05-06 NOTE — ED Notes (Signed)
Attempted to call report

## 2016-05-06 NOTE — Procedures (Signed)
Central Venous Catheter Insertion Procedure Note Cassandra AbbeBillie A [name] [id number] 10/14/1946  Procedure: Insertion of Central Venous Catheter Indications: Assessment of intravascular volume and Drug and/or fluid administration  Procedure Details Consent: Unable to obtain consent because of emergent medical necessity. Time Out: Verified patient identification, verified procedure, site/side was marked, verified correct patient position, special equipment/implants available, medications/allergies/relevent history reviewed, required imaging and test results available.  Performed  Maximum sterile technique was used including antiseptics, cap, gloves, gown, hand hygiene, mask and sheet. Skin prep: Chlorhexidine; local anesthetic administered A antimicrobial bonded/coated triple lumen catheter was placed in the right internal jugular vein using the Seldinger technique.  Evaluation Blood flow good Complications: No apparent complications Patient did tolerate procedure well. Chest X-ray ordered to verify placement.  CXR: normal.  Calogero Geisen 05/06/2016, 3:12 PM

## 2016-05-06 NOTE — Progress Notes (Signed)
E Link notified of pressure drop, will hold on going to CT, patients pressure 60-70 systolic, 2 amps of bicarb ordered and vaso.  Will continue to monitor.   Hermina BartersBOWMAN, Alba Kriesel M, RN

## 2016-05-06 NOTE — ED Notes (Signed)
Received pt via EMS from home with c/o altered mental status onset last night. CBG for EMS read "HI." Pt last dose of insulin was Sunday morning.

## 2016-05-06 NOTE — Progress Notes (Signed)
MD notified of repeat ABG, orders to continue current management.  Hermina BartersBOWMAN, Ephrem Carrick M, RN

## 2016-05-06 NOTE — Progress Notes (Addendum)
eLink Physician-Brief Progress Note Patient Name: Cassandra AbbeBillie A [name] DOB: 1946-06-04 MRN: [id number]   Date of Service  05/06/2016  HPI/Events of Note  Lactic Acid = 1.63 >> 2.3. CVP = 9. Hgb = 9.5. BP = 156/47 (MAP = 69).  Another Lactic Acid is scheduled for 10 PM.  eICU Interventions  Will order:  1. Bolus with 0.9 NaCl 500 mL IV over 30 minutes now.      Intervention Category Major Interventions: Acid-Base disturbance - evaluation and management  Cuauhtemoc Huegel Eugene 05/06/2016, 8:16 PM

## 2016-05-06 NOTE — ED Notes (Signed)
Blood noted in OG tube, Dr. Molli KnockYacoub made aware.

## 2016-05-06 NOTE — Progress Notes (Signed)
eLink Physician-Brief Progress Note Patient Name: Cassandra AbbeBillie A [name] DOB: [birth year] MRN: [id number]   Date of Service  05/06/2016  HPI/Events of Note  Nursing now reports that patient had anisocoria in ED prior to negative Head CT scan which only revealed small vessel disease.   eICU Interventions  Will D/C repeat head CT Scan.      Intervention Category Major Interventions: Change in mental status - evaluation and management  Elna Radovich Eugene 05/06/2016, 7:22 PM

## 2016-05-06 NOTE — Progress Notes (Signed)
eLink Physician-Brief Progress Note Patient Name: Cassandra AbbeBillie A [name] DOB: September 23, 1946 MRN: [id number]   Date of Service  05/06/2016  HPI/Events of Note  Repeat ABG = 7.10/24.8/192/8.2. Currently on a NaHCO3 IV infusion.   eICU Interventions  Continue current management.     Intervention Category Major Interventions: Acid-Base disturbance - evaluation and management  Ascher Schroepfer Eugene 05/06/2016, 5:09 PM

## 2016-05-06 NOTE — Progress Notes (Signed)
eLink Physician-Brief Progress Note Patient Name: Cassandra AbbeBillie A [name] DOB: [birth year] MRN: [id number]   Date of Service  05/06/2016  HPI/Events of Note  DKA - ABG = 6.97/27.4/572.0.   eICU Interventions  Will order: 1. NaHCO3 100 meq IV now. 2. NaHCO3 IV infusion to run at 75 mL/hour. 3. ABG at 5 PM.     Intervention Category Major Interventions: Acid-Base disturbance - evaluation and management  Lenell AntuSommer,Steven Eugene 05/06/2016, 3:48 PM

## 2016-05-06 NOTE — Progress Notes (Signed)
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  Had Head CT earlier today which revealed small vessel disease. Low yield study, however, with new neurological finding we are obligated to repeat the study.   eICU Interventions  Head CT Scan w/o contrast now.     Intervention Category Major Interventions: Change in mental status - evaluation and management  Sommer,Steven Eugene 05/06/2016, 6:18 PM

## 2016-05-06 NOTE — Progress Notes (Signed)
CRITICAL VALUE ALERT  Critical value received:  Potassium = 2.7  Date of notification:  05/06/2016  Time of notification:  8:44 PM   Critical value read back:Yes.    Nurse who received alert:  Wilhelmina McardleLauren Sharifah Champine, RN  MD notified (1st page):  Dr. Arsenio LoaderSommer via eLink RN  Time of first page:  2050  MD notified (2nd page):   Time of second page:  Responding MD:  Dr. Arsenio LoaderSommer  Time MD responded:  2115  Orders Received.

## 2016-05-06 NOTE — Progress Notes (Addendum)
Pharmacy Antibiotic Note  Cassandra [name] is a 70 y.o. female admitted on 05/06/2016 with sepsis. Code sepsis called at 1103. Pharmacy has been consulted for vanc/zosyn dosing. Low temp, wbc 22.8. SCr 4.17 on admit (7 months ago was 1.1), CrCl~10. Hx CKD.  Plan: Vanc 1g IV x1; then 500mg  IV q48h Zosyn 30min inf x1; then 2.25g IV q8h Monitor clinical progress, c/s, renal function, abx plan/LOT May consider checking VR prior to next dose if renal function not improved  Weight: 104 lb (47.174 kg)  Temp (24hrs), Avg:96.4 F (35.8 C), Min:96.4 F (35.8 C), Max:96.4 F (35.8 C)  No results for input(s): WBC, CREATININE, LATICACIDVEN, VANCOTROUGH, VANCOPEAK, VANCORANDOM, GENTTROUGH, GENTPEAK, GENTRANDOM, TOBRATROUGH, TOBRAPEAK, TOBRARND, AMIKACINPEAK, AMIKACINTROU, AMIKACIN in the last 168 hours.  CrCl cannot be calculated (Patient has no serum creatinine result on file.).    Allergies  Allergen Reactions  . Ciprofloxacin Other (See Comments)    No reaction specified  . Codeine Diarrhea and Nausea And Vomiting  . Doxycycline Diarrhea and Nausea And Vomiting  . Hydrocodone   . Omnicef [Cefdinir] Nausea Only and Other (See Comments)    constipation    Antimicrobials this admission: 5/22 vanc >>  5/22 zosyn >>   Dose adjustments this admission:   Microbiology results: 5/22 BCx:  5/22 UCx:      Babs BertinHaley Anastacia Reinecke, PharmD, BCPS Clinical Pharmacist Pager [phone] 05/06/2016 10:58 AM

## 2016-05-06 NOTE — ED Provider Notes (Signed)
CSN: [id number]     Arrival date & time 05/06/16  1033 History   First MD Initiated Contact with Patient 05/06/16 1034     Chief Complaint  Patient presents with  . Altered Mental Status  . Hyperglycemia     (Consider location/radiation/quality/duration/timing/severity/associated sxs/prior Treatment) HPI   Level V caveat applies due to altered mental status.  Cassandra [name] is a 70 y.o. female, with a history of DM, Hyperthyroidism, CKD, and hypertension, presenting to the ED with Altered mental status beginning last night. Patient also noted to be hyperglycemic. Last insulin was yesterday morning. Patient is usually alert and oriented, ambulatory, and cares for herself. Patient lives at home with her husband. Patient was found this morning nonresponsive, nonverbal, and not responding to commands. CBG with EMS read "high."  Husband states patient began to "act different than normal" around 5 or 6 in the evening yesterday. States patient seemed a little bit confused, but they went to bed as they normally would. Patient tossed and turned and mumbled all night. Patient and her husband woke up around 5:30 or 6 this morning and the patient was found to be in her present state of confusion. The husband states he was not sure as to what he should do. Husband then received a call from the patient's ENT notifying him that the patient missed an appointment this morning. When he told them what was going on, they advised him to call 911. Has been as patient has been complaining of frequent migraines and vertigo for about the last year. Husband endorses a similar episode of confusion about 8 years ago, which turned out to be caused by a blood sugar of over 1200. Patient is also a chronic alcohol user, but has been would not quantify her daily intake. End-of-life decisions discussed with the patient's husband. Husband states the patient would agree with intubation but, "does not want to be a vegetable."  Patient would accept CPR and ACLS management.    Past Medical History  Diagnosis Date  . Diabetes mellitus without complication (HCC)   . Thyroid disease   . 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 Depression   . Hypertension   . CKD (chronic kidney disease) stage 3, GFR 30-59 ml/min   . Hypothyroidism   . Vitamin B12 deficiency   . Benign paroxysmal positional vertigo   . Broken shoulder   . Broken toes   . Broken finger   . Migraines   . GERD (gastroesophageal reflux disease)   . Anxiety   . Interstitial cystitis     bladder stretched every 9 months  . Allergic rhinitis   . Glaucoma   . Benign hypertension with CKD (chronic kidney disease) stage III   . Tobacco use   . Cervicalgia   . Elevated liver enzymes Hep B/C neg 2014  . Bipolar disorder (HCC)   . History of alcohol use   . Psoriasis    Past Surgical History  Procedure Laterality Date  . Appendectomy    . Tonsillectomy    . Cholecystectomy    . Hernia repair    . Cataract extraction    . Abdominal hysterectomy      with oophorectomy  . Bladder surgery    . Cervical disc surgery     Family History  Problem Relation Age of Onset  . Alcohol abuse Mother   . Arthritis Mother   . Asthma Mother   . Cancer Mother  colon cancer  . Hypertension Mother   . Migraines Mother   . Stroke Mother   . Lung disease Mother   . COPD Mother   . Diabetes Father   . Hypertension Father   . Heart disease Father   . Heart attack Father   . Heart disease Paternal Grandmother   . Diabetes Paternal Grandmother   . Stroke Paternal Grandmother   . Cancer Paternal Grandmother   . Diabetes Paternal Grandfather    Social History  Substance Use Topics  . Smoking status: Current Every Day Smoker -- 0.25 packs/day for 10 years    Types: Cigarettes  . Smokeless tobacco: Never Used  . Alcohol Use: 4.2 oz/week    7 Glasses of wine per week     Comment: at least one glass of wine a night   OB History     No data available     Review of Systems  Unable to perform ROS: Mental status change  Psychiatric/Behavioral: Positive for confusion.      Allergies  Ciprofloxacin; Codeine; Doxycycline; Hydrocodone; and Omnicef  Home Medications   Prior to Admission medications   Medication Sig Start Date End Date Taking? Authorizing Provider  carvedilol (COREG) 12.5 MG tablet Take 1 tablet (12.5 mg total) by mouth 2 (two) times daily with a meal. 02/12/16   Chilton Siiffany [place], MD  cyanocobalamin (,VITAMIN B-12,) 1000 MCG/ML injection Inject 1,000 mcg into the muscle every 30 (thirty) days.    Historical Provider, MD  hydrochlorothiazide (HYDRODIURIL) 25 MG tablet Take 1 tablet (25 mg total) by mouth daily. 01/17/16   Kerman PasseyMelinda P Lada, MD  insulin lispro (HUMALOG) 100 UNIT/ML injection Inject into the skin once. Sliding scale as needed    Historical Provider, MD  LANTUS 100 UNIT/ML injection Inject 10 Units into the skin every morning.  06/12/15   Historical Provider, MD  levothyroxine (SYNTHROID, LEVOTHROID) 100 MCG tablet Take 1 tablet by mouth daily. Take only on Mon, Wed, Friday. 08/22/15   Historical Provider, MD  LORazepam (ATIVAN) 0.5 MG tablet 0.5 mg once as needed. 11/16/15   Historical Provider, MD  losartan (COZAAR) 100 MG tablet Take 1 tablet (100 mg total) by mouth daily. 01/17/16   Kerman PasseyMelinda P Lada, MD  meclizine (ANTIVERT) 25 MG tablet Take 1 tablet (25 mg total) by mouth 3 (three) times daily as needed for dizziness. 02/12/16   Chilton Siiffany , MD  sertraline (ZOLOFT) 50 MG tablet Take 1.5 tablets (75 mg total) by mouth daily. 01/17/16   Kerman PasseyMelinda P Lada, MD  sodium chloride (MURO 128) 5 % ophthalmic solution Reported on 01/17/2016    Historical Provider, MD   BP 90/65 mmHg  Pulse 101  Temp(Src) 96.4 F (35.8 C) (Rectal)  Resp 25  Wt 47.174 kg  SpO2 93% Physical Exam  Constitutional: She appears well-developed and well-nourished. No distress.  HENT:  Head: Normocephalic and atraumatic.  Eyes:  Conjunctivae are normal.  Patient would not follow commands for EOM exam. Right pupil larger than the left, but nondilated.  Neck: Neck supple.  Cardiovascular: Regular rhythm, normal heart sounds and intact distal pulses.  Tachycardia present.   Pulmonary/Chest: Effort normal and breath sounds normal. No respiratory distress.  Abdominal: Soft. There is no tenderness. There is no guarding.  Musculoskeletal: She exhibits no edema or tenderness.  Lymphadenopathy:    She has no cervical adenopathy.  Neurological: She is alert. She has normal reflexes. GCS eye subscore is 4. GCS verbal subscore is 2. GCS motor subscore is  4.  Patient will not follow commands. This hinders the performance of a full neuro exam. When the patient's limbs are pulled away from her body she forcefully and equally pulls them back in.  Skin: Skin is dry. She is not diaphoretic.  Erythematous, well-circumscribed, flat rash covering front and back torso.  Nursing note and vitals reviewed.   ED Course  Procedures (including critical care time) Labs Review Labs Reviewed  COMPREHENSIVE METABOLIC PANEL - Abnormal; Notable for the following:    Potassium 6.6 (*)    Chloride 97 (*)    CO2 <7 (*)    Glucose, Bld 655 (*)    BUN 50 (*)    Creatinine, Ser 4.17 (*)    Calcium 8.8 (*)    Total Protein 5.6 (*)    Albumin 3.3 (*)    Total Bilirubin 1.6 (*)    GFR calc non Af Amer 10 (*)    GFR calc Af Amer 12 (*)    All other components within normal limits  CBC WITH DIFFERENTIAL/PLATELET - Abnormal; Notable for the following:    WBC 22.8 (*)    RBC 3.32 (*)    Hemoglobin 10.6 (*)    HCT 34.8 (*)    MCV 104.8 (*)    Neutro Abs 18.6 (*)    Monocytes Absolute 1.3 (*)    All other components within normal limits  URINALYSIS, ROUTINE W REFLEX MICROSCOPIC (NOT AT [facility]) - Abnormal; Notable for the following:    APPearance CLOUDY (*)    Specific Gravity, Urine 1.033 (*)    Glucose, UA 250 (*)    Hgb urine dipstick SMALL  (*)    Ketones, ur 15 (*)    Protein, ur >300 (*)    All other components within normal limits  AMMONIA - Abnormal; Notable for the following:    Ammonia 61 (*)    All other components within normal limits  T4, FREE - Abnormal; Notable for the following:    Free T4 0.42 (*)    All other components within normal limits  URINE MICROSCOPIC-ADD ON - Abnormal; Notable for the following:    Squamous Epithelial / LPF 6-30 (*)    Bacteria, UA MANY (*)    Casts GRANULAR CAST (*)    All other components within normal limits  CBG MONITORING, ED - Abnormal; Notable for the following:    Glucose-Capillary >600 (*)    All other components within normal limits  I-STAT VENOUS BLOOD GAS, ED - Abnormal; Notable for the following:    pH, Ven 6.898 (*)    pCO2, Ven 24.4 (*)    pO2, Ven 74.0 (*)    Bicarbonate 4.8 (*)    Acid-base deficit 27.0 (*)    All other components within normal limits  I-STAT TROPOININ, ED - Abnormal; Notable for the following:    Troponin i, poc 0.46 (*)    All other components within normal limits  CULTURE, BLOOD (ROUTINE X 2)  URINE CULTURE  CULTURE, BLOOD (ROUTINE X 2)  URINE RAPID DRUG SCREEN, HOSP PERFORMED  PROTIME-INR  TSH  ETHANOL  I-STAT CG4 LACTIC ACID, ED  CBG MONITORING, ED  I-STAT ARTERIAL BLOOD GAS, ED    Imaging Review Ct Head Wo Contrast  05/06/2016  CLINICAL DATA:  Altered mental status since last night. Hyperglycemia. History of chronic kidney disease and hypertension. EXAM: CT HEAD WITHOUT CONTRAST TECHNIQUE: Contiguous axial images were obtained from the base of the skull through the vertex without intravenous contrast. COMPARISON:  Head CT 04/11/2016 and 10/06/2013. FINDINGS: Brain: There is no evidence of acute intracranial hemorrhage, mass lesion, brain edema or extra-axial fluid collection. The ventricles and subarachnoid spaces are appropriately sized for age. There is no CT evidence of acute cortical infarction. There are stable mild chronic  small vessel ischemic changes in the periventricular white matter. Intracranial vascular calcifications are present. Bones/sinuses/visualized face: The visualized paranasal sinuses, mastoid air cells and middle ears are clear. The calvarium is intact. IMPRESSION: Stable head CT demonstrating mild small vessel ischemic changes. No acute intracranial findings. Electronically Signed   By: Carey Bullocks M.D.   On: 05/06/2016 12:17   Dg Chest Portable 1 View  05/06/2016  CLINICAL DATA:  Sepsis.  Altered mental status EXAM: PORTABLE CHEST 1 VIEW COMPARISON:  04/20/2014 FINDINGS: The heart size and mediastinal contours are within normal limits. Both lungs are clear. The visualized skeletal structures are unremarkable. IMPRESSION: No active disease. Electronically Signed   By: Marlan Palau M.D.   On: 05/06/2016 11:44   I have personally reviewed and evaluated these images and lab results as part of my medical decision-making.   EKG Interpretation   Date/Time:  Monday May 06 2016 10:45:03 EDT Ventricular Rate:  102 PR Interval:  178 QRS Duration: 92 QT Interval:  364 QTC Calculation: 474 R Axis:   79 Text Interpretation:  Sinus tachycardia Probable left atrial enlargement  Repol abnrm, severe global ischemia (LM/MVD) rate is faster, otherwise  similar to Mar 2016 Confirmed by Criss Alvine MD, SCOTT [phone]) on 05/06/2016  10:51:13 AM Also confirmed by GOLDSTON MD, SCOTT [phone]), editor Whitney Post,  Cala Bradford [phone])  on 05/06/2016 11:07:33 AM       Sepsis - Repeat Assessment  Performed at:    1200  Vitals     Blood pressure 127/112, pulse 107, temperature 96.4 F (35.8 C), temperature source Rectal, resp. rate 27, height 5\' 5"  (1.651 m), weight 47.174 kg, SpO2 99 %.  Heart:     Tachycardic  Lungs:    CTA  Capillary Refill:   <2 sec  Peripheral Pulse:   Radial pulse palpable  Skin:     Normal Color and Dry   CRITICAL CARE Performed by: Keoki Mchargue C Jamiere Gulas Total critical care time: 60  minutes Critical care time was exclusive of separately billable procedures and treating other patients. Critical care was necessary to treat or prevent imminent or life-threatening deterioration. Critical care was time spent personally by me on the following activities: development of treatment plan with patient and/or surrogate as well as nursing, discussions with consultants, evaluation of patient's response to treatment, examination of patient, obtaining history from patient or surrogate, ordering and performing treatments and interventions, ordering and review of laboratory studies, ordering and review of radiographic studies, pulse oximetry and re-evaluation of patient's condition.  Medications  insulin regular (NOVOLIN R,HUMULIN R) 250 Units in sodium chloride 0.9 % 250 mL (1 Units/mL) infusion (5.4 Units/hr Intravenous New Bag/Given 05/06/16 1255)  sodium bicarbonate injection (50 mEq Intravenous Given 05/06/16 1240)  etomidate (AMIDATE) injection (20 mg Intravenous Given 05/06/16 1240)  rocuronium (ZEMURON) injection (50 mg Intravenous Given 05/06/16 1240)  0.9 %  sodium chloride infusion ( Intravenous New Bag/Given 05/06/16 1314)  heparin injection 5,000 Units (not administered)  dextrose 5 %-0.45 % sodium chloride infusion (not administered)  piperacillin-tazobactam (ZOSYN) IVPB 2.25 g (not administered)  vancomycin (VANCOCIN) 500 mg in sodium chloride 0.9 % 100 mL IVPB (not administered)  sodium chloride 0.9 % bolus 1,000 mL (0 mLs Intravenous Stopped  05/06/16 1207)    And  sodium chloride 0.9 % bolus 500 mL (0 mLs Intravenous Stopped 05/06/16 1153)  vancomycin (VANCOCIN) IVPB 1000 mg/200 mL premix (0 mg Intravenous Stopped 05/06/16 1226)  piperacillin-tazobactam (ZOSYN) IVPB 3.375 g (0 g Intravenous Stopped 05/06/16 1154)  LORazepam (ATIVAN) injection 0.5 mg (0.5 mg Intravenous Given 05/06/16 1119)  LORazepam (ATIVAN) injection 1 mg (1 mg Intravenous Given 05/06/16 1147)  sodium chloride 0.9 %  bolus 1,000 mL (1,000 mLs Intravenous New Bag/Given 05/06/16 1249)  calcium gluconate 1 g in sodium chloride 0.9 % 100 mL IVPB (0 g Intravenous Stopped 05/06/16 1331)   Orders Placed This Encounter  Procedures  . Urine culture  . Blood Cultures (routine x 2)  . CT Head Wo Contrast  . DG Chest Portable 1 View  . DG Chest Portable 1 View  . Comprehensive metabolic panel  . CBC with Differential  . Urinalysis, Routine w reflex microscopic  . Urine rapid drug screen (hosp performed)  . Ammonia  . Protime-INR  . TSH  . T4, free  . Ethanol  . Urine microscopic-add on  . Diet NPO time specified  . Cardiac monitoring  . Neuro checks  . In and Out Cath  . Document vital signs within 1-hour of fluid bolus completion and notify provider of bolus completion  . Document Actual / Estimated Weight  . Insert peripheral IV x 2  . Vital signs  . Notify physician  . Please refer to DKA Protocol sidebar report  . Please refer to Hypoglycemia sidebar report  . Strict intake and output  . SCDs  . Place TED hose  . Practitioner attestation of consent  . IV bolus initiated in ED  . K+  > 5 mEq/L = no potassium  . Full code  . Call Code Sepsis([facility] [phone]) Reason for Consult?: tracking  . piperacillin-tazobactam (ZOSYN) per pharmacy consult  . vancomycin per pharmacy consult  . Pulse oximetry, continuous  . Adult mechanical ventilation  . POC CBG, ED  . I-Stat venous blood gas, ED  . I-Stat CG4 Lactic Acid, ED  (not at [facility]  . CBG monitoring, ED  . I-stat troponin, ED  . I-Stat arterial blood gas, ED  . EKG 12-Lead  . ED EKG  . Saline lock IV  . Admit to Inpatient (patient's expected length of stay will be greater than 2 midnights or inpatient only procedure)  . Restraints medical non-violent    MDM   Final diagnoses:  Diabetic ketoacidosis with coma associated with type 1 diabetes mellitus (HCC)  Metabolic acidosis  Acute respiratory failure with hypoxia and hypercapnia  (HCC)    Cassandra [name] presents with altered mental status that began last night.  Findings and plan of care discussed with Pricilla Loveless, MD. Dr. Criss Alvine personally evaluated and examined this patient.  Patient with altered mental status and hyperglycemia. Patient also tachycardic, hypothermic, hypotensive, and hypoxic. ICH versus DKA versus sepsis. Myxedema hypothyroidism is also a consideration. Sepsis protocol initiated. Abdomen required for patient safety due to agitation as well as to facilitate CT scan. Patient continued to have decline in mental status and responsiveness. Patient intubated and admitted by critical care team.   Filed Vitals:   05/06/16 1130 05/06/16 1145 05/06/16 1242 05/06/16 1244  BP: 101/63 86/59 157/90   Pulse:   102   Temp:      TempSrc:      Resp: 16 23    Height:     (1.651 m)  Weight:      SpO2:   100%    Filed Vitals:   05/06/16 1300 05/06/16 1301 05/06/16 1310 05/06/16 1320  BP: 108/53 158/102 123/56 127/112  Pulse: 102 100 107 107  Temp:      TempSrc:      Resp: 30 30 18 27   Height:      Weight:      SpO2: 100% 100% 100% 99%      Anselm Pancoast, PA-C 05/06/16 1408  Pricilla Loveless, MD 05/09/16 1104

## 2016-05-07 ENCOUNTER — Inpatient Hospital Stay (HOSPITAL_COMMUNITY): Payer: Medicare Other

## 2016-05-07 LAB — BASIC METABOLIC PANEL
ANION GAP: 16 — AB (ref 5–15)
ANION GAP: 14 (ref 5–15)
Anion gap: 10 (ref 5–15)
Anion gap: 9 (ref 5–15)
BUN: 35 mg/dL — ABNORMAL HIGH (ref 6–20)
BUN: 36 mg/dL — ABNORMAL HIGH (ref 6–20)
BUN: 33 mg/dL — AB (ref 6–20)
BUN: 33 mg/dL — AB (ref 6–20)
CHLORIDE: 109 mmol/L (ref 101–111)
CHLORIDE: 109 mmol/L (ref 101–111)
CHLORIDE: 110 mmol/L (ref 101–111)
CHLORIDE: 109 mmol/L (ref 101–111)
CO2: 19 mmol/L — ABNORMAL LOW (ref 22–32)
CO2: 23 mmol/L (ref 22–32)
CO2: 24 mmol/L (ref 22–32)
CO2: 24 mmol/L (ref 22–32)
CREATININE: 3.14 mg/dL — AB (ref 0.44–1.00)
Calcium: 6.8 mg/dL — ABNORMAL LOW (ref 8.9–10.3)
Calcium: 7.1 mg/dL — ABNORMAL LOW (ref 8.9–10.3)
Calcium: 6.7 mg/dL — ABNORMAL LOW (ref 8.9–10.3)
Calcium: 6.4 mg/dL — CL (ref 8.9–10.3)
Creatinine, Ser: 3.45 mg/dL — ABNORMAL HIGH (ref 0.44–1.00)
Creatinine, Ser: 4.78 mg/dL — ABNORMAL HIGH (ref 0.44–1.00)
Creatinine, Ser: 3.08 mg/dL — ABNORMAL HIGH (ref 0.44–1.00)
GFR calc Af Amer: 15 mL/min — ABNORMAL LOW (ref >60)
GFR calc Af Amer: 17 mL/min — ABNORMAL LOW (ref >60)
GFR calc Af Amer: 16 mL/min — ABNORMAL LOW (ref >60)
GFR calc non Af Amer: 13 mL/min — ABNORMAL LOW (ref >60)
GFR calc non Af Amer: 9 mL/min — ABNORMAL LOW (ref >60)
GFR calc non Af Amer: 14 mL/min — ABNORMAL LOW (ref >60)
GFR, EST AFRICAN AMERICAN: 10 mL/min — AB (ref >60)
GFR, EST NON AFRICAN AMERICAN: 15 mL/min — AB (ref >60)
GLUCOSE: 219 mg/dL — AB (ref 65–99)
GLUCOSE: 103 mg/dL — AB (ref 65–99)
GLUCOSE: 117 mg/dL — AB (ref 65–99)
Glucose, Bld: 122 mg/dL — ABNORMAL HIGH (ref 65–99)
POTASSIUM: 2.6 mmol/L — AB (ref 3.5–5.1)
POTASSIUM: 4.1 mmol/L (ref 3.5–5.1)
Potassium: 2.7 mmol/L — CL (ref 3.5–5.1)
Potassium: 3.3 mmol/L — ABNORMAL LOW (ref 3.5–5.1)
SODIUM: 142 mmol/L (ref 135–145)
Sodium: 144 mmol/L (ref 135–145)
Sodium: 146 mmol/L — ABNORMAL HIGH (ref 135–145)
Sodium: 144 mmol/L (ref 135–145)

## 2016-05-07 LAB — URINE CULTURE

## 2016-05-07 LAB — GLUCOSE, CAPILLARY
GLUCOSE-CAPILLARY: 105 mg/dL — AB (ref 65–99)
GLUCOSE-CAPILLARY: 130 mg/dL — AB (ref 65–99)
GLUCOSE-CAPILLARY: 157 mg/dL — AB (ref 65–99)
GLUCOSE-CAPILLARY: 238 mg/dL — AB (ref 65–99)
GLUCOSE-CAPILLARY: 269 mg/dL — AB (ref 65–99)
GLUCOSE-CAPILLARY: 89 mg/dL (ref 65–99)
GLUCOSE-CAPILLARY: 91 mg/dL (ref 65–99)
Glucose-Capillary: 277 mg/dL — ABNORMAL HIGH (ref 65–99)
Glucose-Capillary: 211 mg/dL — ABNORMAL HIGH (ref 65–99)
Glucose-Capillary: 199 mg/dL — ABNORMAL HIGH (ref 65–99)
Glucose-Capillary: 120 mg/dL — ABNORMAL HIGH (ref 65–99)
Glucose-Capillary: 104 mg/dL — ABNORMAL HIGH (ref 65–99)
Glucose-Capillary: 70 mg/dL (ref 65–99)
Glucose-Capillary: 85 mg/dL (ref 65–99)
Glucose-Capillary: 103 mg/dL — ABNORMAL HIGH (ref 65–99)
Glucose-Capillary: 186 mg/dL — ABNORMAL HIGH (ref 65–99)

## 2016-05-07 LAB — CBC
HEMATOCRIT: 27.6 % — AB (ref 36.0–46.0)
HEMOGLOBIN: 9.5 g/dL — AB (ref 12.0–15.0)
MCH: 32.1 pg (ref 26.0–34.0)
MCHC: 34.4 g/dL (ref 30.0–36.0)
MCV: 93.2 fL (ref 78.0–100.0)
Platelets: 211 10*3/uL (ref 150–400)
RBC: 2.96 MIL/uL — ABNORMAL LOW (ref 3.87–5.11)
RDW: 13 % (ref 11.5–15.5)
WBC: 19.2 10*3/uL — AB (ref 4.0–10.5)

## 2016-05-07 LAB — FERRITIN: Ferritin: 106 ng/mL (ref 11–307)

## 2016-05-07 LAB — POCT I-STAT 3, ART BLOOD GAS (G3+)
ACID-BASE EXCESS: 2 mmol/L (ref 0.0–2.0)
Bicarbonate: 22.6 mEq/L (ref 20.0–24.0)
O2 SAT: 100 %
PCO2 ART: 21.7 mmHg — AB (ref 35.0–45.0)
PH ART: 7.626 — AB (ref 7.350–7.450)
Patient temperature: 98.6
TCO2: 23 mmol/L (ref 0–100)
pO2, Arterial: 169 mmHg — ABNORMAL HIGH (ref 80.0–100.0)

## 2016-05-07 LAB — IRON AND TIBC
Iron: 25 ug/dL — ABNORMAL LOW (ref 28–170)
SATURATION RATIOS: 12 % (ref 10.4–31.8)
TIBC: 209 ug/dL — ABNORMAL LOW (ref 250–450)
UIBC: 184 ug/dL

## 2016-05-07 LAB — PROCALCITONIN: PROCALCITONIN: 2.1 ng/mL

## 2016-05-07 LAB — PROTEIN / CREATININE RATIO, URINE
CREATININE, URINE: 85.16 mg/dL
Protein Creatinine Ratio: 1.57 mg/mg{Cre} — ABNORMAL HIGH (ref 0.00–0.15)
Total Protein, Urine: 134 mg/dL

## 2016-05-07 LAB — CREATININE, URINE, RANDOM: CREATININE, URINE: 84.96 mg/dL

## 2016-05-07 LAB — MAGNESIUM: Magnesium: 1.4 mg/dL — ABNORMAL LOW (ref 1.7–2.4)

## 2016-05-07 LAB — PHOSPHORUS

## 2016-05-07 LAB — SODIUM, URINE, RANDOM: Sodium, Ur: 53 mmol/L

## 2016-05-07 MED ORDER — DEXTROSE 50 % IV SOLN
INTRAVENOUS | Status: AC
  Administered 2016-05-07: 12 mL
  Filled 2016-05-07: qty 50

## 2016-05-07 MED ORDER — ANTISEPTIC ORAL RINSE SOLUTION (CORINZ)
7.0000 mL | OROMUCOSAL | Status: DC
  Administered 2016-05-07 (×4): 7 mL via OROMUCOSAL

## 2016-05-07 MED ORDER — POTASSIUM CHLORIDE 10 MEQ/50ML IV SOLN
10.0000 meq | INTRAVENOUS | Status: AC
  Administered 2016-05-07 (×4): 10 meq via INTRAVENOUS
  Filled 2016-05-07 (×4): qty 50

## 2016-05-07 MED ORDER — ANTISEPTIC ORAL RINSE SOLUTION (CORINZ)
7.0000 mL | Freq: Four times a day (QID) | OROMUCOSAL | Status: DC
  Administered 2016-05-08 – 2016-05-16 (×19): 7 mL via OROMUCOSAL

## 2016-05-07 MED ORDER — CHLORHEXIDINE GLUCONATE 0.12% ORAL RINSE (MEDLINE KIT)
15.0000 mL | Freq: Two times a day (BID) | OROMUCOSAL | Status: DC
  Administered 2016-05-07 – 2016-05-16 (×13): 15 mL via OROMUCOSAL

## 2016-05-07 MED ORDER — POTASSIUM PHOSPHATES 15 MMOLE/5ML IV SOLN
30.0000 mmol | Freq: Once | INTRAVENOUS | Status: AC
  Administered 2016-05-07: 30 mmol via INTRAVENOUS
  Filled 2016-05-07: qty 10

## 2016-05-07 MED ORDER — CHLORHEXIDINE GLUCONATE 0.12% ORAL RINSE (MEDLINE KIT)
15.0000 mL | Freq: Two times a day (BID) | OROMUCOSAL | Status: DC
  Administered 2016-05-07: 15 mL via OROMUCOSAL

## 2016-05-07 MED ORDER — LEVOTHYROXINE SODIUM 100 MCG PO TABS
100.0000 ug | ORAL_TABLET | Freq: Every day | ORAL | Status: DC
  Administered 2016-05-08 – 2016-05-09 (×2): 100 ug
  Filled 2016-05-07 (×2): qty 1

## 2016-05-07 MED ORDER — ONDANSETRON HCL 4 MG/2ML IJ SOLN
4.0000 mg | Freq: Three times a day (TID) | INTRAMUSCULAR | Status: DC | PRN
  Administered 2016-05-07 – 2016-05-18 (×11): 4 mg via INTRAVENOUS
  Filled 2016-05-07 (×11): qty 2

## 2016-05-07 MED ORDER — SODIUM CHLORIDE 0.9 % IV SOLN
2.0000 g | Freq: Once | INTRAVENOUS | Status: AC
  Administered 2016-05-07: 2 g via INTRAVENOUS
  Filled 2016-05-07: qty 20

## 2016-05-07 MED ORDER — POTASSIUM CHLORIDE 10 MEQ/50ML IV SOLN
10.0000 meq | INTRAVENOUS | Status: AC
  Administered 2016-05-07 (×2): 10 meq via INTRAVENOUS
  Filled 2016-05-07 (×2): qty 50

## 2016-05-07 MED ORDER — MAGNESIUM SULFATE 4 GM/100ML IV SOLN
4.0000 g | Freq: Once | INTRAVENOUS | Status: AC
  Administered 2016-05-07: 4 g via INTRAVENOUS
  Filled 2016-05-07: qty 100

## 2016-05-07 NOTE — Care Management Note (Signed)
Case Management Note  Patient Details  Name: Cassandra [name] MRN: [id number] Date of Birth: [birth year]  Subjective/Objective:    Pt admitted with DKA                Action/Plan:  Pt is from home with husband.  CM requested to consult with daughters.  Pt daughters informed CM of; pt has extensive substance abuse hx (alcohol) along with prescription drugs - husband is also a heavy drinker, multiple past suicide attempts, multiple falls at home, concern with current living conditions.  CM informed daughters that pt will be evaluated for safe discharge by PT per mobility post extubations, CM consoled daughters and consulted with CSW regarding substance abuse and current living arrangement concerns.  CM also consulted with bedside nurse concerns relayed.  CM will continue to follow for disposition needs   Expected Discharge Date:                  Expected Discharge Plan:     In-House Referral:  Clinical Social Work  Discharge planning Services  CM Consult  Post Acute Care Choice:    Choice offered to:     DME Arranged:    DME Agency:     HH Arranged:    HH Agency:     Status of Service:  In process, will continue to follow  Medicare Important Message Given:    Date Medicare IM Given:    Medicare IM give by:    Date Additional Medicare IM Given:    Additional Medicare Important Message give by:     If discussed at Long Length of Stay Meetings, dates discussed:    Additional Comments:  Cherylann ParrClaxton, Harman Langhans S, RN 05/07/2016, 3:16 PM

## 2016-05-07 NOTE — Progress Notes (Signed)
eLink Physician-Brief Progress Note Patient Name: Cassandra AbbeBillie A [name] DOB: 09-Sep-1946 MRN: [id number]   Date of Service  05/07/2016  HPI/Events of Note    eICU Interventions  Hypokalemia/hypophos/hypomag -repleted      Intervention Category Major Interventions: Electrolyte abnormality - evaluation and management  Sami Roes V. 05/07/2016, 6:18 AM

## 2016-05-07 NOTE — Progress Notes (Signed)
eLink Physician-Brief Progress Note Patient Name: Cassandra AbbeBillie A Sampley DOB: 19-Apr-1946 MRN: [id number]   Date of Service  05/07/2016  HPI/Events of Note  K+ = 3.3 and Creatinine = 3.14 (improving).  eICU Interventions  Will cautiously replete K+.     Intervention Category Intermediate Interventions: Electrolyte abnormality - evaluation and management  Sommer,Steven Eugene 05/07/2016, 7:29 PM

## 2016-05-07 NOTE — Progress Notes (Signed)
CRITICAL VALUE ALERT  Critical value received:  Potassium = 2.7; Phosphorous <1  Date of notification:  05/07/2016   Time of notification:  6:14 AM   Critical value read back:Yes.    Nurse who received alert:  Wilhelmina McardleLauren Shimika Ames, RN  MD notified (1st page):  Dr. Vassie LollAlva  Time of first page:  6:15 AM   MD notified (2nd page):  Time of second page:  Responding MD:  Dr. Vassie LollAlva  Time MD responded:  6:17 AM

## 2016-05-07 NOTE — Progress Notes (Signed)
eLink Physician-Brief Progress Note Patient Name: Cassandra [name] DOB: Mar 27, 1946 MRN: [id number]   Date of Service  05/07/2016  HPI/Events of Note    eICU Interventions  Hypokalemia -repleted      Intervention Category Intermediate Interventions: Electrolyte abnormality - evaluation and management  Torre Schaumburg V. 05/07/2016, 3:59 AM

## 2016-05-07 NOTE — Progress Notes (Signed)
eLink Physician-Brief Progress Note Patient Name: Cassandra AbbeBillie A [name] DOB: September 25, 1946 MRN: [id number]   Date of Service  05/07/2016  HPI/Events of Note  resp alkalosis  eICU Interventions  Drop RR 20 Dc bicarb gtt     Intervention Category Major Interventions: Acid-Base disturbance - evaluation and management  Kamdin Follett V. 05/07/2016, 5:08 AM

## 2016-05-07 NOTE — Progress Notes (Signed)
eLink Physician-Brief Progress Note Patient Name: Cassandra AbbeBillie A [name] DOB: [birth year] MRN: [id number]   Date of Service  05/07/2016  HPI/Events of Note  Ca++ = 6.4 and albumin = 3.3. Ca++ corrects to 6.96.  eICU Interventions  Will replete Ca++.     Intervention Category Intermediate Interventions: Electrolyte abnormality - evaluation and management  Lenell AntuSommer,Steven Eugene 05/07/2016, 6:44 PM

## 2016-05-07 NOTE — Progress Notes (Signed)
PULMONARY / CRITICAL CARE MEDICINE   Name: Cassandra [name] MRN: [id number] DOB: October 21, 1946    ADMISSION DATE:  05/06/2016 CONSULTATION DATE:  05/06/2016  REFERRING MD:  Dr. Criss AlvineGoldston  CHIEF COMPLAINT:  DKA   HISTORY OF PRESENT ILLNESS:   This is a 70 y.o. female with PMH of Type 1 Diabetes mellitus, Hypothyroidism, Hypertension, CKD stage 3 (GFR 30-59 ml/min), Tobacco use, GERD, elevated liver enzymes (Hep B/C neg 2014).  She presents to the ER 05/06/2016 with altered mental status onset last night. On a good day pt is normally ambulatory and performs ADL's independently. Pt has been complaining of frequent migraines and vertigo for about the last year she is followed by ENT. Husband endorses a similar episode of confusion about 8 years ago, which turned out to be caused by a blood sugar of over 1200. Patient is also a chronic alcohol user, husband states pt drinks everyday throughout the day for the past 10-15 years, last alcoholic beverage per husband was 05/05/2016.  According to pts. husband pt began acting different around 5 or 6 pm 05/05/2016 with slight confusion.  Pt tossed and mumbled throughout the night. On 05/06/2016 she had persistent confusion and the husband was unsure as to what he should do. Husband then received a call from the patient's ENT physicians office notifying him that the patient missed an appointment this morning. When he told them what was going on, they advised him to call 911. Upon EMS arrival CBG reading "High," pts. last dose of insulin was Sunday morning 05/05/2016.  PCCM consulted 05/06/2016 for management of acute hypoxic respiratory failure, acute encephalopathy and DKA   SUBJECTIVE:  Sedated and no follows commands on vent  VITAL SIGNS: BP 132/63 mmHg  Pulse 87  Temp(Src) 98.6 F (37 C) (Oral)  Resp 9  Ht 5\' 5"  (1.651 m)  Wt 125 lb 10.6 oz (57 kg)  BMI 20.91 kg/m2  SpO2 100%  HEMODYNAMICS: CVP:  [3 mmHg-14 mmHg] 12 mmHg  VENTILATOR SETTINGS: Vent  Mode:  [-] PRVC FiO2 (%):  [40 %-100 %] 40 % Set Rate:  [20 bmp-35 bmp] 20 bmp Vt Set:  [480 mL] 480 mL PEEP:  [5 cmH20] 5 cmH20 Plateau Pressure:  [18 cmH20-28 cmH20] 22 cmH20  INTAKE / OUTPUT: I/O last 3 completed shifts: In: 6669.5 [I.V.:5199.5; NG/GT:60; IV Piggyback:1410] Out: 550 [Urine:350; Emesis/NG output:100; Drains:100]  PHYSICAL EXAMINATION: General:  Critically ill appearing caucasion female Neuro:  does not follow commands, bilateral pupils 3 mm equal and reactive HEENT:  Supple, no JVD Cardiovascular:  SR, s1s2, no murmur Lungs: rhonchi throughout,  Abdomen: +BS x4, soft non distended, non tender Musculoskeletal:  Moves all extremities, normal bulk Skin:  intact  LABS:  BMET  Recent Labs Lab 05/06/16 1958 05/07/16 0130 05/07/16 0500  NA 145 144 146*  K 2.7* 2.6* 2.7*  CL 107 109 109  CO2 12* 19* 23  BUN 40* 35* 36*  CREATININE 3.73* 3.45* 4.78*  GLUCOSE 365* 219* 122*    Electrolytes  Recent Labs Lab 05/06/16 1514 05/06/16 1609 05/06/16 1958 05/07/16 0130 05/07/16 0500  CALCIUM  --  7.4* 7.0* 6.8* 7.1*  MG 2.6* 2.2  --   --  1.4*  PHOS 10.4*  --   --   --  <1.0*    CBC  Recent Labs Lab 05/06/16 1113 05/06/16 1609 05/07/16 0500  WBC 22.8* 22.8* 19.2*  HGB 10.6* 9.5* 9.5*  HCT 34.8* 30.6* 27.6*  PLT 252 235 211  Coag's  Recent Labs Lab 05/06/16 1113  INR 1.13    Sepsis Markers  Recent Labs Lab 05/06/16 1126 05/06/16 1513 05/06/16 1918 05/06/16 2235 05/07/16 0500  LATICACIDVEN 1.63  --  2.3* 2.2*  --   PROCALCITON  --  0.69  --   --  2.10    ABG  Recent Labs Lab 05/06/16 1706 05/06/16 1838 05/07/16 0500  PHART 7.111* 7.187* 7.626*  PCO2ART 24.8* 21.6* 21.7*  PO2ART 179.0* 168.0* 169.0*    Liver Enzymes  Recent Labs Lab 05/06/16 1113  AST 34  ALT 15  ALKPHOS 50  BILITOT 1.6*  ALBUMIN 3.3*    Cardiac Enzymes No results for input(s): TROPONINI, PROBNP in the last 168 hours.  Glucose  Recent  Labs Lab 05/07/16 0127 05/07/16 0230 05/07/16 0353 05/07/16 0459 05/07/16 0556 05/07/16 0651  GLUCAP 211* 199* 157* 120* 91 70    Imaging Ct Head Wo Contrast  05/06/2016  CLINICAL DATA:  Altered mental status since last night. Hyperglycemia. History of chronic kidney disease and hypertension. EXAM: CT HEAD WITHOUT CONTRAST TECHNIQUE: Contiguous axial images were obtained from the base of the skull through the vertex without intravenous contrast. COMPARISON:  Head CT 04/11/2016 and 10/06/2013. FINDINGS: Brain: There is no evidence of acute intracranial hemorrhage, mass lesion, brain edema or extra-axial fluid collection. The ventricles and subarachnoid spaces are appropriately sized for age. There is no CT evidence of acute cortical infarction. There are stable mild chronic small vessel ischemic changes in the periventricular white matter. Intracranial vascular calcifications are present. Bones/sinuses/visualized face: The visualized paranasal sinuses, mastoid air cells and middle ears are clear. The calvarium is intact. IMPRESSION: Stable head CT demonstrating mild small vessel ischemic changes. No acute intracranial findings. Electronically Signed   By: Carey Bullocks M.D.   On: 05/06/2016 12:17   Dg Chest Port 1 View  05/07/2016  CLINICAL DATA:  Respiratory failure. EXAM: PORTABLE CHEST 1 VIEW COMPARISON:  05/06/2016. FINDINGS: Endotracheal tube, NG tube, right IJ line stable position. Heart size normal. Bibasilar atelectasis and/or infiltrates. No pleural effusion or pneumothorax. Prior cervical spine fusion. IMPRESSION: 1.  Lines and tubes in stable position. 2.  Mild bibasilar atelectasis and/or infiltrates. Electronically Signed   By: Maisie Fus  Register   On: 05/07/2016 07:49   Dg Chest Portable 1 View  05/06/2016  CLINICAL DATA:  intubation Central line placement EXAM: PORTABLE CHEST 1 VIEW COMPARISON:  05/06/2016 at 1117 hours FINDINGS: Endotracheal tube terminates 5.2 cm above  carina.Nasogastric tube extends beyond the inferior aspect of the film. Right internal jugular line tip at mid SVC. Midline trachea. Normal heart size. No pleural effusion or pneumothorax. Clear lungs. Cervical spine fixation. IMPRESSION: Appropriate position of endotracheal and right internal jugular lines. No pneumothorax or other acute complication. Electronically Signed   By: Jeronimo Greaves M.D.   On: 05/06/2016 14:39   Dg Chest Portable 1 View  05/06/2016  CLINICAL DATA:  Sepsis.  Altered mental status EXAM: PORTABLE CHEST 1 VIEW COMPARISON:  04/20/2014 FINDINGS: The heart size and mediastinal contours are within normal limits. Both lungs are clear. The visualized skeletal structures are unremarkable. IMPRESSION: No active disease. Electronically Signed   By: Marlan Palau M.D.   On: 05/06/2016 11:44     STUDIES:  Ct of Head 5/22>>negative Urine drug screen 5/22>>negative   CULTURES: Blood 5/22>> Urine 5/22>>insig growth   ANTIBIOTICS: Vancomycin 5/22>> Zosyn 5/22>  SIGNIFICANT EVENTS: 5/22>>Pt arrived via EMS with AMS and elevated blood glucose level 5/22>>Pt intubated in ED  secondary to acute hypoxic respiratory distress 5/23 repletion of electrolytes    LINES/TUBES: ETT>>5/22 Right IJ CVC>>5/22 R radial a-line 5/22>>>  DISCUSSION: This is a 70 y.o. female with PMH of Type 1 Diabetes mellitus, Hypothyroidism, Hypertension, CKD stage 3 (GFR 30-59 ml/min), Tobacco use, GERD, elevated liver enzymes (Hep B/C neg 2014).  She presents to the ER 05/06/2016 with altered mental status. Hypotensive and in metabolic disarray. Monitor for ETOH withdrawal .   ASSESSMENT / PLAN:  PULMONARY A: Acute hypoxic respiratory failure  Underlying COPD Tobacco abuse P:   Full vent support Repeat ABG's  CXR in am Titrate O2 for sat of 88-92%  CARDIOVASCULAR A:  Hx: Hypertension with CKD-controlled P:  Serial troponin's Maintain map >65 Levophed for SBP support, weaning 5/23 Follow  CVP, now 12  RENAL Lab Results  Component Value Date   CREATININE 4.78* 05/07/2016   CREATININE 3.45* 05/07/2016   CREATININE 3.73* 05/06/2016   CREATININE 1.17 06/15/2014   CREATININE 0.74 10/09/2013   CREATININE 0.67 10/08/2013    Recent Labs Lab 05/06/16 1958 05/07/16 0130 05/07/16 0500  K 2.7* 2.6* 2.7*     A:   AKI (Hx: CKD baseline creatinine 1.10-1.50) Hyperkalemia P:   BMP's q4hrs while on insulin drip Replace electrolytes as indicated, Phos, K, Mg being repeleted Monitor urinary output  GASTROINTESTINAL A:   Hx: GERD, Elevated liver enzymes (Hep B/C neg 2004) P:   PPI for SUP Keep NPO for now  HEMATOLOGIC A:   No problems identified P:  Heparin/SCD for VTE prophylaxis    Monitor for s/s of bleeding Transfuse for HgB <7 Trend CBC's   INFECTIOUS A:   UTI P:   Continue abx as listed above Monitor cultures Trend WBC's and monitor fever curve Trend Procalcitonin's . 69->2.10  ENDOCRINE A:   DKA P:   Initiate insulin drip per protocol, transition to SQ insulin once AG closed , currently 14 BMP's q4hrs until anion gap closed and CO2 within normal limits Continue outpatient synthroid IVF per protocol(d5 .45)  NEUROLOGIC A:   Acute encephalopathy Hx: Migraines, Bipolar Disorder, ETOH abuse, Benign paroxysmal positional vertigo P:   RASS goal: 0 to -1 Monitor s/sx of ETOH withdrawl Fentanyl drip  Hold outpatient ativan, zoloft Prn fentanyl and versed to maintain RASS goal Thiamine/folate/MVI Daily wakeup assessment   FAMILY  - Updates: no family at bedside  [facility] Minor ACNP Adolph Pollack PCCM Pager [phone] till 3 pm If no answer page [phone] 05/07/2016, 7:56 AM  Attending Note:  70 year old female with extensive PMH presenting with DKA, ARF and respiratory failure.  Gap is closing and bicarb is improving.  Continue DKA protocol for now.  Hold off weaning.  Will consult nephrology for low UOP and rising cr.  Will continue sedation at  this time.  Levophed demand decreasing.  Replace electrolytes and hold diureses.  Procalcitonin is 2.1, will continue abx for now.  Family updated bedside.  The patient is critically ill with multiple organ systems failure and requires high complexity decision making for assessment and support, frequent evaluation and titration of therapies, application of advanced monitoring technologies and extensive interpretation of multiple databases.   Critical Care Time devoted to patient care services described in this note is  35  Minutes. This time reflects time of care of this signee Dr [name]. This critical care time does not reflect procedure time, or teaching time or supervisory time of PA/NP/Med student/Med Resident etc but could involve care discussion time.  Rush Farmer, M.D. [facility] Pulmonary/Critical Care Medicine. Pager: [phone]. After hours pager: [phone].

## 2016-05-07 NOTE — Progress Notes (Signed)
Wasted 250ml fentanyl in sink with Wilhelmina McardleLauren Kennedy, RN.  Hermina BartersBOWMAN, Lanell Carpenter M, RN

## 2016-05-07 NOTE — Progress Notes (Addendum)
CRITICAL VALUE ALERT  Critical value received:  6.4 Calcium  Date of notification:  05/07/16  Time of notification:  1835  Critical value read back:Yes.    Nurse who received alert: Hoover BrownsKristy Chanique Duca  MD notified (1st page):  [phone]  Time of first page:  1835  MD notified (2nd page):  Time of second page:  Responding MD: Mindi JunkerMarsha, RN took message for MD  Time MD responded:  [phone]

## 2016-05-07 NOTE — Progress Notes (Signed)
CRITICAL VALUE ALERT  Critical value received:  Potassium = 2.6  Date of notification:  05/07/2016  Time of notification:  3:50 AM   Critical value read back:Yes.    Nurse who received alert:  Wilhelmina McardleLauren Jamera Vanloan, RN  MD notified (1st page):  Dr. Vassie LollAlva  Time of first page:  3:51 AM  MD notified (2nd page):  Time of second page:  Responding MD:  Dr. Vassie LollAlva  Time MD responded:  4:02 AM

## 2016-05-07 NOTE — Consult Note (Signed)
Reason for Consult: Acute renal failure on Chronic kidney disease stage III Referring Physician: Jennet Maduro M.D. (CCM)  HPI:  70 year old Caucasian woman with past medical history significant for type 1 diabetes, hypertension, history of chronic interstitial cystitis, chronic alcohol abuse and chronic kidney disease stage III (baseline creatinine appears to be between 1.1-1.5). She was admitted with altered mental status and found to be diabetic ketoacidosis with consequent intubation for acute hypoxic respiratory failure.  Concern is raised that in spite of aggressive fluid resuscitation, her urine output has been marginal and decreasing while her renal function does not show signs of recovery as expected. Prior to admission, she was taking hydrochlorothiazide and losartan. No information as to whether she recently had NSAIDs, iodinated intravenous contrast or antibiotics. No reported history of nausea, vomiting or diarrhea.  Past Medical History  Diagnosis Date  . Diabetes mellitus without complication ([place])   . Thyroid disease   . Type 1 diabetes mellitus with renal complications ([place])   . Depression   . Hypertension   . CKD (chronic kidney disease) stage 3, GFR 30-59 ml/min   . Hypothyroidism   . Vitamin B12 deficiency   . Benign paroxysmal positional vertigo   . Broken shoulder   . Broken toes   . Broken finger   . Migraines   . GERD (gastroesophageal reflux disease)   . Anxiety   . Interstitial cystitis     bladder stretched every 9 months  . Allergic rhinitis   . Glaucoma   . Benign hypertension with CKD (chronic kidney disease) stage III   . Tobacco use   . Cervicalgia   . Elevated liver enzymes Hep B/C neg 2014  . Bipolar disorder ([place])   . History of alcohol use   . Psoriasis     Past Surgical History  Procedure Laterality Date  . Appendectomy    . Tonsillectomy    . Cholecystectomy    . Hernia repair    . Cataract extraction    . Abdominal hysterectomy       with oophorectomy  . Bladder surgery    . Cervical disc surgery      Family History  Problem Relation Age of Onset  . Alcohol abuse Mother   . Arthritis Mother   . Asthma Mother   . Cancer Mother     colon cancer  . Hypertension Mother   . Migraines Mother   . Stroke Mother   . Lung disease Mother   . COPD Mother   . Diabetes Father   . Hypertension Father   . Heart disease Father   . Heart attack Father   . Heart disease Paternal Grandmother   . Diabetes Paternal Grandmother   . Stroke Paternal Grandmother   . Cancer Paternal Grandmother   . Diabetes Paternal Grandfather     Social History:  reports that she has been smoking Cigarettes.  She has a 2.5 pack-year smoking history. She has never used smokeless tobacco. She reports that she drinks about 4.2 oz of alcohol per week. She reports that she does not use illicit drugs.  Allergies:  Allergies  Allergen Reactions  . Ciprofloxacin Other (See Comments)    No reaction specified  . Codeine Diarrhea and Nausea And Vomiting  . Doxycycline Diarrhea and Nausea And Vomiting  . Hydrocodone   . Omnicef [Cefdinir] Nausea Only and Other (See Comments)    constipation    Medications:  Scheduled: . antiseptic oral rinse  7 mL Mouth Rinse 10  times per day  . chlorhexidine gluconate (SAGE KIT)  15 mL Mouth Rinse BID  . folic acid  1 mg Intravenous Daily  . heparin  5,000 Units Subcutaneous Q8H  . pantoprazole (PROTONIX) IV  40 mg Intravenous Q12H  . piperacillin-tazobactam (ZOSYN)  IV  2.25 g Intravenous Q8H  . sodium chloride  500 mL Intravenous Once  . thiamine IV  100 mg Intravenous Daily  . [START ON 05/08/2016] vancomycin  500 mg Intravenous Q48H    BMP Latest Ref Rng 05/07/2016 05/07/2016 05/07/2016  Glucose 65 - 99 mg/dL 103(H) 122(H) 219(H)  BUN 6 - 20 mg/dL 33(H) 36(H) 35(H)  Creatinine 0.44 - 1.00 mg/dL 3.08(H) 4.78(H) 3.45(H)  Sodium 135 - 145 mmol/L 144 146(H) 144  Potassium 3.5 - 5.1 mmol/L 4.1 2.7(LL) 2.6(LL)   Chloride 101 - 111 mmol/L 110 109 109  CO2 22 - 32 mmol/L 24 23 19(L)  Calcium 8.9 - 10.3 mg/dL 6.7(L) 7.1(L) 6.8(L)    CBC Latest Ref Rng 05/07/2016 05/06/2016 05/06/2016  WBC 4.0 - 10.5 K/uL 19.2(H) 22.8(H) 22.8(H)  Hemoglobin 12.0 - 15.0 g/dL 9.5(L) 9.5(L) 10.6(L)  Hematocrit 36.0 - 46.0 % 27.6(L) 30.6(L) 34.8(L)  Platelets 150 - 400 K/uL 211 235 252      Ct Head Wo Contrast  05/06/2016  CLINICAL DATA:  Altered mental status since last night. Hyperglycemia. History of chronic kidney disease and hypertension. EXAM: CT HEAD WITHOUT CONTRAST TECHNIQUE: Contiguous axial images were obtained from the base of the skull through the vertex without intravenous contrast. COMPARISON:  Head CT 04/11/2016 and 10/06/2013. FINDINGS: Brain: There is no evidence of acute intracranial hemorrhage, mass lesion, brain edema or extra-axial fluid collection. The ventricles and subarachnoid spaces are appropriately sized for age. There is no CT evidence of acute cortical infarction. There are stable mild chronic small vessel ischemic changes in the periventricular white matter. Intracranial vascular calcifications are present. Bones/sinuses/visualized face: The visualized paranasal sinuses, mastoid air cells and middle ears are clear. The calvarium is intact. IMPRESSION: Stable head CT demonstrating mild small vessel ischemic changes. No acute intracranial findings. Electronically Signed   By: Richardean Sale M.D.   On: 05/06/2016 12:17   Dg Chest Port 1 View  05/07/2016  CLINICAL DATA:  Respiratory failure. EXAM: PORTABLE CHEST 1 VIEW COMPARISON:  05/06/2016. FINDINGS: Endotracheal tube, NG tube, right IJ line stable position. Heart size normal. Bibasilar atelectasis and/or infiltrates. No pleural effusion or pneumothorax. Prior cervical spine fusion. IMPRESSION: 1.  Lines and tubes in stable position. 2.  Mild bibasilar atelectasis and/or infiltrates. Electronically Signed   By: Marcello Moores  Register   On: 05/07/2016 07:49    Dg Chest Portable 1 View  05/06/2016  CLINICAL DATA:  intubation Central line placement EXAM: PORTABLE CHEST 1 VIEW COMPARISON:  05/06/2016 at 1117 hours FINDINGS: Endotracheal tube terminates 5.2 cm above carina.Nasogastric tube extends beyond the inferior aspect of the film. Right internal jugular line tip at mid SVC. Midline trachea. Normal heart size. No pleural effusion or pneumothorax. Clear lungs. Cervical spine fixation. IMPRESSION: Appropriate position of endotracheal and right internal jugular lines. No pneumothorax or other acute complication. Electronically Signed   By: Abigail Miyamoto M.D.   On: 05/06/2016 14:39   Dg Chest Portable 1 View  05/06/2016  CLINICAL DATA:  Sepsis.  Altered mental status EXAM: PORTABLE CHEST 1 VIEW COMPARISON:  04/20/2014 FINDINGS: The heart size and mediastinal contours are within normal limits. Both lungs are clear. The visualized skeletal structures are unremarkable. IMPRESSION: No active disease.  Electronically Signed   By: Franchot Gallo M.D.   On: 05/06/2016 11:44    Review of Systems  Unable to perform ROS: intubated   Blood pressure 120/58, pulse 83, temperature 98.1 F (36.7 C), temperature source Axillary, resp. rate 15, height _0  (1.651 m), weight 57 kg (125 lb 10.6 oz), SpO2 100 %. Physical Exam  Nursing note and vitals reviewed. Constitutional: She appears well-developed.  Appears older than stated age, sedated/intubated  HENT:  Head: Normocephalic.  Intubated/sedated  Eyes: Pupils are equal, round, and reactive to light.  Neck: No JVD present.  Cardiovascular: Normal rate, regular rhythm and normal heart sounds.   Respiratory: Effort normal and breath sounds normal. She has no wheezes. She has no rales.  GI: Bowel sounds are normal. She exhibits distension. There is no tenderness. There is no rebound and no guarding.  Musculoskeletal: She exhibits no edema.  Skin: Skin is warm and dry. No erythema.    Assessment/Plan: 1. Acute  renal failure on chronic kidney disease stage III: Currently appears to be oliguric however concerns with continued decline of urine output noted. Data consistent with evolution of prerenal state to ATN with presence of DKA and what appears to be septic shock. Will check urine electrolytes and renal ultrasound. We'll continue to follow on current intravenous fluids/pressors and limit episodes of hypotension. Avoid intravenous iodinated contrast as you're currently doing. Will follow closely for emergent dialysis needs. At this time, she appears to be euvolemic and without any critical electrolyte abnormalities to prompt intervention with dialysis. 2. Diabetic ketoacidosis: Improved anion gap and glycemic control overnight with insulin drip. Now on D5 half-normal saline along with insulin drip for glycemic control. 3. Septic shock: The exact source/etiology at this time remains unclear, she is on empiric antibiotic therapy with Zosyn and vancomycin. She is on pressor support with Levophed. 4. Anemia: Likely chronic from chronic alcohol use/malnutrition. Will check iron studies and decide on need for supplementation plus/minus ESA. 5. Chronic alcohol abuse: [place] of chronic alcoholism-multiple electrolyte abnormalities and malnutrition. On withdrawal watch/protocol.  Cassandra [name] K. 05/07/2016, 1:51 PM

## 2016-05-08 ENCOUNTER — Inpatient Hospital Stay (HOSPITAL_COMMUNITY): Payer: Medicare Other

## 2016-05-08 LAB — BASIC METABOLIC PANEL
ANION GAP: 7 (ref 5–15)
BUN: 30 mg/dL — AB (ref 6–20)
CHLORIDE: 110 mmol/L (ref 101–111)
CO2: 23 mmol/L (ref 22–32)
Calcium: 6.5 mg/dL — ABNORMAL LOW (ref 8.9–10.3)
Creatinine, Ser: 3.02 mg/dL — ABNORMAL HIGH (ref 0.44–1.00)
GFR calc Af Amer: 17 mL/min — ABNORMAL LOW (ref >60)
GFR calc non Af Amer: 15 mL/min — ABNORMAL LOW (ref >60)
Glucose, Bld: 158 mg/dL — ABNORMAL HIGH (ref 65–99)
POTASSIUM: 3.6 mmol/L (ref 3.5–5.1)
SODIUM: 140 mmol/L (ref 135–145)

## 2016-05-08 LAB — BLOOD GAS, ARTERIAL
Acid-base deficit: 0.8 mmol/L (ref 0.0–2.0)
BICARBONATE: 22.9 meq/L (ref 20.0–24.0)
FIO2: 0.4
LHR: 14 {breaths}/min
MECHVT: 480 mL
O2 SAT: 99.3 %
PATIENT TEMPERATURE: 98.6
PCO2 ART: 35.2 mmHg (ref 35.0–45.0)
PEEP/CPAP: 5 cmH2O
PO2 ART: 160 mmHg — AB (ref 80.0–100.0)
TCO2: 24 mmol/L (ref 0–100)
pH, Arterial: 7.429 (ref 7.350–7.450)

## 2016-05-08 LAB — CBC
HCT: 27.4 % — ABNORMAL LOW (ref 36.0–46.0)
Hemoglobin: 9.5 g/dL — ABNORMAL LOW (ref 12.0–15.0)
MCH: 31.7 pg (ref 26.0–34.0)
MCHC: 34.7 g/dL (ref 30.0–36.0)
MCV: 91.3 fL (ref 78.0–100.0)
PLATELETS: 187 10*3/uL (ref 150–400)
RBC: 3 MIL/uL — AB (ref 3.87–5.11)
RDW: 13.3 % (ref 11.5–15.5)
WBC: 16.9 10*3/uL — AB (ref 4.0–10.5)

## 2016-05-08 LAB — GLUCOSE, CAPILLARY
GLUCOSE-CAPILLARY: 105 mg/dL — AB (ref 65–99)
GLUCOSE-CAPILLARY: 106 mg/dL — AB (ref 65–99)
GLUCOSE-CAPILLARY: 129 mg/dL — AB (ref 65–99)
GLUCOSE-CAPILLARY: 144 mg/dL — AB (ref 65–99)
GLUCOSE-CAPILLARY: 146 mg/dL — AB (ref 65–99)
GLUCOSE-CAPILLARY: 155 mg/dL — AB (ref 65–99)
GLUCOSE-CAPILLARY: 162 mg/dL — AB (ref 65–99)
GLUCOSE-CAPILLARY: 167 mg/dL — AB (ref 65–99)
GLUCOSE-CAPILLARY: 172 mg/dL — AB (ref 65–99)
GLUCOSE-CAPILLARY: 177 mg/dL — AB (ref 65–99)
GLUCOSE-CAPILLARY: 188 mg/dL — AB (ref 65–99)
GLUCOSE-CAPILLARY: 192 mg/dL — AB (ref 65–99)
GLUCOSE-CAPILLARY: 198 mg/dL — AB (ref 65–99)
GLUCOSE-CAPILLARY: 198 mg/dL — AB (ref 65–99)
Glucose-Capillary: 164 mg/dL — ABNORMAL HIGH (ref 65–99)
Glucose-Capillary: 140 mg/dL — ABNORMAL HIGH (ref 65–99)
Glucose-Capillary: 120 mg/dL — ABNORMAL HIGH (ref 65–99)
Glucose-Capillary: 128 mg/dL — ABNORMAL HIGH (ref 65–99)
Glucose-Capillary: 153 mg/dL — ABNORMAL HIGH (ref 65–99)
Glucose-Capillary: 124 mg/dL — ABNORMAL HIGH (ref 65–99)
Glucose-Capillary: 123 mg/dL — ABNORMAL HIGH (ref 65–99)
Glucose-Capillary: 149 mg/dL — ABNORMAL HIGH (ref 65–99)
Glucose-Capillary: 129 mg/dL — ABNORMAL HIGH (ref 65–99)

## 2016-05-08 LAB — RENAL FUNCTION PANEL
Albumin: 1.8 g/dL — ABNORMAL LOW (ref 3.5–5.0)
Anion gap: 10 (ref 5–15)
BUN: 32 mg/dL — AB (ref 6–20)
CHLORIDE: 108 mmol/L (ref 101–111)
CO2: 24 mmol/L (ref 22–32)
CREATININE: 2.97 mg/dL — AB (ref 0.44–1.00)
Calcium: 6.8 mg/dL — ABNORMAL LOW (ref 8.9–10.3)
GFR calc Af Amer: 18 mL/min — ABNORMAL LOW (ref >60)
GFR, EST NON AFRICAN AMERICAN: 15 mL/min — AB (ref >60)
GLUCOSE: 131 mg/dL — AB (ref 65–99)
POTASSIUM: 3.6 mmol/L (ref 3.5–5.1)
Phosphorus: 3.5 mg/dL (ref 2.5–4.6)
Sodium: 142 mmol/L (ref 135–145)

## 2016-05-08 LAB — PROCALCITONIN: PROCALCITONIN: 1.63 ng/mL

## 2016-05-08 LAB — MAGNESIUM: Magnesium: 2.5 mg/dL — ABNORMAL HIGH (ref 1.7–2.4)

## 2016-05-08 MED ORDER — JEVITY 1.2 CAL PO LIQD: 1000.0000 mL | ORAL | Status: DC

## 2016-05-08 MED ORDER — DEXMEDETOMIDINE HCL IN NACL 400 MCG/100ML IV SOLN
0.0000 ug/kg/h | INTRAVENOUS | Status: DC
  Administered 2016-05-08: 0.4 ug/kg/h via INTRAVENOUS
  Administered 2016-05-08: 0.7 ug/kg/h via INTRAVENOUS
  Filled 2016-05-08 (×2): qty 50
  Filled 2016-05-08: qty 100
  Filled 2016-05-08: qty 50

## 2016-05-08 MED ORDER — INSULIN ASPART 100 UNIT/ML ~~LOC~~ SOLN
0.0000 [IU] | SUBCUTANEOUS | Status: DC
  Administered 2016-05-08 (×2): 2 [IU] via SUBCUTANEOUS
  Administered 2016-05-08 – 2016-05-09 (×3): 3 [IU] via SUBCUTANEOUS
  Administered 2016-05-09: 2 [IU] via SUBCUTANEOUS
  Administered 2016-05-10: 11 [IU] via SUBCUTANEOUS
  Administered 2016-05-10 (×2): 3 [IU] via SUBCUTANEOUS
  Administered 2016-05-10: 5 [IU] via SUBCUTANEOUS
  Administered 2016-05-11 (×2): 3 [IU] via SUBCUTANEOUS
  Administered 2016-05-11: 2 [IU] via SUBCUTANEOUS
  Administered 2016-05-11: 3 [IU] via SUBCUTANEOUS
  Administered 2016-05-12: 5 [IU] via SUBCUTANEOUS
  Administered 2016-05-12: 3 [IU] via SUBCUTANEOUS
  Administered 2016-05-12: 2 [IU] via SUBCUTANEOUS
  Administered 2016-05-13: 3 [IU] via SUBCUTANEOUS
  Administered 2016-05-13: 8 [IU] via SUBCUTANEOUS
  Administered 2016-05-13: 5 [IU] via SUBCUTANEOUS
  Administered 2016-05-13: 2 [IU] via SUBCUTANEOUS
  Administered 2016-05-13: 3 [IU] via SUBCUTANEOUS
  Administered 2016-05-14: 5 [IU] via SUBCUTANEOUS
  Administered 2016-05-14: 8 [IU] via SUBCUTANEOUS
  Administered 2016-05-14: 5 [IU] via SUBCUTANEOUS
  Administered 2016-05-15: 11 [IU] via SUBCUTANEOUS
  Administered 2016-05-15: 8 [IU] via SUBCUTANEOUS
  Administered 2016-05-15: 3 [IU] via SUBCUTANEOUS

## 2016-05-08 MED ORDER — ADULT MULTIVITAMIN LIQUID CH
15.0000 mL | Freq: Every day | ORAL | Status: DC
  Administered 2016-05-08 – 2016-05-09 (×2): 15 mL
  Filled 2016-05-08 (×2): qty 15

## 2016-05-08 MED ORDER — GLUCERNA 1.2 CAL PO LIQD
1000.0000 mL | ORAL | Status: DC
  Administered 2016-05-09 (×2): 1000 mL
  Filled 2016-05-08 (×3): qty 1000

## 2016-05-08 MED ORDER — INSULIN GLARGINE 100 UNIT/ML ~~LOC~~ SOLN
10.0000 [IU] | Freq: Every day | SUBCUTANEOUS | Status: DC
  Administered 2016-05-08: 10 [IU] via SUBCUTANEOUS
  Filled 2016-05-08 (×2): qty 0.1

## 2016-05-08 MED ORDER — GLUCERNA 1.2 CAL PO LIQD
1000.0000 mL | ORAL | Status: DC
  Administered 2016-05-08: 1000 mL
  Filled 2016-05-08: qty 1000

## 2016-05-08 MED ORDER — CHLORHEXIDINE GLUCONATE 0.12 % MT SOLN
OROMUCOSAL | Status: AC
  Administered 2016-05-08: 15 mL via OROMUCOSAL
  Filled 2016-05-08: qty 15

## 2016-05-08 NOTE — Progress Notes (Addendum)
Initial Nutrition Assessment  DOCUMENTATION CODES:   Not applicable  INTERVENTION:    Recommend increasing Glucerna 1.2 formula by 10 ml every 12 hours to goal rate of 40 ml/hr with Prostat liquid protein 30 ml TID   Add liquid MVI daily >> done   Monitor Mg, Phos, K+  Total above TF regimen to provide 1452 kcals, 102 gm protein, 773 ml of free water  NUTRITION DIAGNOSIS:   Inadequate oral intake related to inability to eat as evidenced by NPO status  GOAL:   Patient will meet greater than or equal to 90% of their needs  MONITOR:   TF tolerance, Vent status, Labs, Weight trends, I & O's  REASON FOR ASSESSMENT:   Ventilator  ASSESSMENT:   70 y.o. Female with PMH of Type 1 Diabetes mellitus, hypothyroidism, HTN, CKD stage 3 (GFR 30-59 ml/min), Tobacco use, GERD, elevated liver enzymes (Hep B/C neg 2014). She presents to the ER 05/06/2016 with altered mental status onset last night.PCCM consulted 05/06/2016 for management of acute hypoxic respiratory failure, acute encephalopathy and DKA.  Patient is currently intubated on ventilator support -- OGT in place MV: 11.7 L/min Temp (24hrs), Avg:98.1 F (36.7 C), Min:97.5 F (36.4 C), Max:98.6 F (37 C)   Glucerna 1.2 formula ordered at 20 ml/hr per CCM. Noted pt with hx of ETOH abuse (daily). Receiving folate and thiamine. Patient at risk for refeeding syndrome. Unable to complete Nutrition Focused Physical Exam at this time.  Diet Order:  Diet NPO time specified  Skin:  Reviewed, no issues  Last BM:  N/A  Height:   Ht Readings from Last 1 Encounters:  05/06/16 5\' 5"  (1.651 m)    Weight:   Wt Readings from Last 1 Encounters:  05/08/16 133 lb 2.5 oz (60.4 kg)    Ideal Body Weight:  56.8 kg  BMI:  Body mass index is 22.16 kg/(m^2).  Estimated Nutritional Needs:   Kcal:  1419  Protein:  90-100 gm  Fluid:  per MD  EDUCATION NEEDS:   No education needs identified at this time  Maureen ChattersKatie Harlin Mazzoni,  RD, LDN Pager #: [phone] After-Hours Pager #: [phone]

## 2016-05-08 NOTE — Progress Notes (Signed)
PULMONARY / CRITICAL CARE MEDICINE   Name: Cassandra [name] MRN: [id number] DOB: 1946-10-31    ADMISSION DATE:  05/06/2016 CONSULTATION DATE:  05/06/2016  REFERRING MD:  Dr. Criss AlvineGoldston  CHIEF COMPLAINT:  DKA   HISTORY OF PRESENT ILLNESS:   This is a 70 y.o. female with PMH of Type 1 Diabetes mellitus, Hypothyroidism, Hypertension, CKD stage 3 (GFR 30-59 ml/min), Tobacco use, GERD, elevated liver enzymes (Hep B/C neg 2014).  She presents to the ER 05/06/2016 with altered mental status onset last night. On a good day pt is normally ambulatory and performs ADL's independently. Pt has been complaining of frequent migraines and vertigo for about the last year she is followed by ENT. Husband endorses a similar episode of confusion about 8 years ago, which turned out to be caused by a blood sugar of over 1200. Patient is also a chronic alcohol user, husband states pt drinks everyday throughout the day for the past 10-15 years, last alcoholic beverage per husband was 05/05/2016.  According to pts. husband pt began acting different around 5 or 6 pm 05/05/2016 with slight confusion.  Pt tossed and mumbled throughout the night. On 05/06/2016 she had persistent confusion and the husband was unsure as to what he should do. Husband then received a call from the patient's ENT physicians office notifying him that the patient missed an appointment this morning. When he told them what was going on, they advised him to call 911. Upon EMS arrival CBG reading "High," pts. last dose of insulin was Sunday morning 05/05/2016.  PCCM consulted 05/06/2016 for management of acute hypoxic respiratory failure, acute encephalopathy and DKA   SUBJECTIVE:  Sedated and no follows commands on vent, Failed weaning but sedated with versed for agitation.  VITAL SIGNS: BP 104/52 mmHg  Pulse 73  Temp(Src) 97.9 F (36.6 C) (Axillary)  Resp 20  Ht 5\' 5"  (1.651 m)  Wt 133 lb 2.5 oz (60.4 kg)  BMI 22.16 kg/m2  SpO2  100%  HEMODYNAMICS: CVP:  [5 mmHg-18 mmHg] 14 mmHg  VENTILATOR SETTINGS: Vent Mode:  [-] PRVC FiO2 (%):  [40 %] 40 % Set Rate:  [14 bmp-20 bmp] 14 bmp Vt Set:  [480 mL] 480 mL PEEP:  [5 cmH20] 5 cmH20 Plateau Pressure:  [21 cmH20-34 cmH20] 22 cmH20  INTAKE / OUTPUT: I/O last 3 completed shifts: In: 6539.7 [I.V.:4339.7; NG/GT:90; IV Piggyback:2110] Out: 790 [Urine:690; Emesis/NG output:100]  PHYSICAL EXAMINATION: General:  Critically ill appearing caucasion female Neuro:  does not follow commands, bilateral pupils 3 mm equal and reactive HEENT:  Supple, no JVD Cardiovascular:  SR, s1s2, no murmur Lungs: rhonchi throughout, diminished in bases Abdomen: +BS x4, soft non distended, non tender Musculoskeletal:  Moves all extremities, normal bulk Skin:  intact  LABS:  BMET  Recent Labs Lab 05/07/16 1100 05/07/16 1755 05/08/16 0400  NA 144 142 142  K 4.1 3.3* 3.6  CL 110 109 108  CO2 24 24 24   BUN 33* 33* 32*  CREATININE 3.08* 3.14* 2.97*  GLUCOSE 103* 117* 131*    Electrolytes  Recent Labs Lab 05/06/16 1514 05/06/16 1609  05/07/16 0500 05/07/16 1100 05/07/16 1755 05/08/16 0400  CALCIUM  --  7.4*  < > 7.1* 6.7* 6.4* 6.8*  MG 2.6* 2.2  --  1.4*  --   --  2.5*  PHOS 10.4*  --   --  <1.0*  --   --  3.5  < > = values in this interval not displayed.  CBC  Recent  Labs Lab 05/06/16 1609 05/07/16 0500 05/08/16 0400  WBC 22.8* 19.2* 16.9*  HGB 9.5* 9.5* 9.5*  HCT 30.6* 27.6* 27.4*  PLT 235 211 187    Coag's  Recent Labs Lab 05/06/16 1113  INR 1.13    Sepsis Markers  Recent Labs Lab 05/06/16 1126 05/06/16 1513 05/06/16 1918 05/06/16 2235 05/07/16 0500 05/08/16 0400  LATICACIDVEN 1.63  --  2.3* 2.2*  --   --   PROCALCITON  --  0.69  --   --  2.10 1.63    ABG  Recent Labs Lab 05/06/16 1838 05/07/16 0500 05/08/16 0403  PHART 7.187* 7.626* 7.429  PCO2ART 21.6* 21.7* 35.2  PO2ART 168.0* 169.0* 160*    Liver Enzymes  Recent  Labs Lab 05/06/16 1113 05/08/16 0400  AST 34  --   ALT 15  --   ALKPHOS 50  --   BILITOT 1.6*  --   ALBUMIN 3.3* 1.8*    Cardiac Enzymes No results for input(s): TROPONINI, PROBNP in the last 168 hours.  Glucose  Recent Labs Lab 05/08/16 0153 05/08/16 0301 05/08/16 0401 05/08/16 0510 05/08/16 0559 05/08/16 0657  GLUCAP 153* 144* 124* 123* 129* 149*    Imaging US Renal  05/07/2016  CLINICAL DATA:  Acute on chronic renal failure.  Initial encounter. EXAM: RENAL / URINARY TRACT ULTRASOUND COMPLETE COMPARISON:  CT of the abdomen and pelvis performed 11/19/2011, and renal ultrasound performed 09/30/2008 FINDINGS: Right Kidney: Length: 9.9 cm. Echogenicity within normal limits. No mass or hydronephrosis visualized. Left Kidney: Length: 9.4 cm. Echogenicity within normal limits. No mass or hydronephrosis visualized. Bladder: Decompressed, with a Foley catheter in place. IMPRESSION: Unremarkable renal ultrasound. Electronically Signed   By: Roanna Raider M.D.   On: 05/07/2016 23:38   Dg Chest Port 1 View  05/08/2016  CLINICAL DATA:  Endotracheal tube position EXAM: PORTABLE CHEST 1 VIEW COMPARISON:  05/07/2016 FINDINGS: Endotracheal tube remains in good position. Right jugular central venous catheter tip in the mid SVC unchanged. NG tube in the stomach. Mild bibasilar airspace disease improved. Negative for heart failure or effusion. No edema. IMPRESSION: Endotracheal tube remains in good position. Improved aeration in the lung bases with decrease in bibasilar airspace disease. Electronically Signed   By: Marlan Palau M.D.   On: 05/08/2016 07:50     STUDIES:  Ct of Head 5/22>>negative Urine drug screen 5/22>>negative   CULTURES: Blood 5/22>> Urine 5/22>>insig growth   ANTIBIOTICS: Vancomycin 5/22>> Zosyn 5/22>  SIGNIFICANT EVENTS: 5/22>>Pt arrived via EMS with AMS and elevated blood glucose level 5/22>>Pt intubated in ED secondary to acute hypoxic respiratory  distress 5/23 repletion of electrolytes    LINES/TUBES: ETT>>5/22 Right IJ CVC>>5/22 R radial a-line 5/22>>>  DISCUSSION: This is a 70 y.o. female with PMH of Type 1 Diabetes mellitus, Hypothyroidism, Hypertension, CKD stage 3 (GFR 30-59 ml/min), Tobacco use, GERD, elevated liver enzymes (Hep B/C neg 2014).  She presents to the ER 05/06/2016 with altered mental status. Hypotensive and in metabolic disarray. Monitor for ETOH withdrawal .   ASSESSMENT / PLAN:  PULMONARY A: Acute hypoxic respiratory failure  Underlying COPD Tobacco abuse P:   Full vent support Repeat ABG's as needed CXR in am Titrate O2 for sat of 88-92%  CARDIOVASCULAR A:  Hx: Hypertension with CKD-controlled P:  Serial troponin's Maintain map >65 Levophed for SBP support, weaning 5/24 Follow CVP, now 14  RENAL Lab Results  Component Value Date   CREATININE 2.97* 05/08/2016   CREATININE 3.14* 05/07/2016   CREATININE 3.08*  05/07/2016   CREATININE 1.17 06/15/2014   CREATININE 0.74 10/09/2013   CREATININE 0.67 10/08/2013    Recent Labs Lab 05/07/16 1100 05/07/16 1755 05/08/16 0400  K 4.1 3.3* 3.6     A:   AKI (Hx: CKD baseline creatinine 1.10-1.50) Hyperkalemia P:    Replace electrolytes as indicated, Phos, K, Mg being repeleted Monitor urinary output, improved 5/24, continue IVF  GASTROINTESTINAL A:   Hx: GERD, Elevated liver enzymes (Hep B/C neg 2004) P:   PPI for SUP Keep NPO for now  HEMATOLOGIC A:   No problems identified P:  Heparin/SCD for VTE prophylaxis    Monitor for s/s of bleeding Transfuse for HgB <7 Trend CBC's   INFECTIOUS A:   UTI P:   Continue abx as listed above Monitor cultures Trend WBC's and monitor fever curve Trend Procalcitonin's . 69->2.10  ENDOCRINE A:   DKA P:   Transition to ICU glucose protocol. AG closed Start TF ad trickle dose  NEUROLOGIC A:   Acute encephalopathy Hx: Migraines, Bipolar Disorder, ETOH abuse, Benign paroxysmal  positional vertigo P:   RASS goal: 0 to -1 Monitor s/sx of ETOH withdrawl Fentanyl drip , back off sedation Hold outpatient ativan, zoloft Prn fentanyl and versed to maintain RASS goal Thiamine/folate/MVI Daily wakeup assessment   FAMILY  - Updates: no family at bedside  [facility] ACNP Ad[facility] Pollack PCCM Pager [phone] till 3 pm If no answer page [phone]  Attending Note:  70 year old female with extensive PMH presenting with DKA, ARF and respiratory failure. Gap is closed, off insulin drip, Lantus started and TF. Off pressors and Cr plateaued but UOP is very low.  Renal service following and appreciate input, no dialysis for now.  If remains off pressors by AM will need diureses. Hold off weaning for now until able to diurese. Will continue sedation at this time. Add precedex to [facility] if needed. Replace electrolytes and hold diureses. Procalcitonin is 2.1, will continue abx for now. Family updated bedside.  The patient is critically ill with multiple organ systems failure and requires high complexity decision making for assessment and support, frequent evaluation and titration of therapies, application of advanced monitoring technologies and extensive interpretation of multiple databases.   Critical Care Time devoted to patient care services described in this note is 35 Minutes. This time reflects time of care of this signee Dr [name]. This critical care time does not reflect procedure time, or teaching time or supervisory time of PA/NP/Med student/Med Resident etc but could involve care discussion time.  Alyson Reedy, M.D. [facility] Pulmonary/Critical Care Medicine. Pager: [phone]. After hours pager: [phone].  05/08/2016, 9:22 AM

## 2016-05-08 NOTE — Care Management Important Message (Signed)
Important Message  Patient Details  Name: Merril AbbeBillie A Motter MRN: [id number] Date of Birth: 31-May-1946   Medicare Important Message Given:  Yes    Bernadette HoitShoffner, Maridel Pixler Coleman 05/08/2016, 11:13 AM

## 2016-05-08 NOTE — Progress Notes (Signed)
Patient ID: Cassandra [name], female   DOB: Jan 20, 1946, 70 y.o.   MRN: [id number]  [facility] Progress Note   Assessment/ Plan:   1. Acute renal failure on chronic kidney disease stage III: Oliguric and with evidence now pointing towards ATN likely mediated by DKA/sepsis-without any uremic signs or acute needs for intervention with hemodialysis at this point. Continue supportive management while monitoring renal function anticipating recovery as hemodynamic status improves. Renal ultrasound negative for any obstruction or structural pathology. 2. Diabetic ketoacidosis:  3. Septic shock: The exact source/etiology at this time remains unclear, she is on empiric antibiotic therapy with Zosyn and vancomycin. She is on pressor support with low dose Levophed. 4. Anemia: Likely chronic from chronic alcohol use/malnutrition. With evidence of iron deficiency-will give intravenous iron 5. Chronic alcohol abuse: Sequela of chronic alcoholism-multiple electrolyte abnormalities and malnutrition. On withdrawal watch/protocol.  Subjective:   Agitated overnight, no acute clinical events.    Objective:   BP 138/50 mmHg  Pulse 70  Temp(Src) 97.9 F (36.6 C) (Axillary)  Resp 20  Ht '5\' 5"'$  (1.651 m)  Wt 60.4 kg (133 lb 2.5 oz)  BMI 22.16 kg/m2  SpO2 100%  Intake/Output Summary (Last 24 hours) at 05/08/16 0847 Last data filed at 05/08/16 0800  Gross per 24 hour  Intake 2741.07 ml  Output    485 ml  Net 2256.07 ml   Weight change: 13.226 kg (29 lb 2.5 oz)  Physical Exam: OZH:[id number], appears comfortable on ventilator CVS: Regular rhythm, normal rate Resp: Coarse breath sounds-no rales/rhonchi Abd: Soft, slightly distended, nontender Ext: No lower extremity edema  Imaging: Ct Head Wo Contrast  05/06/2016  CLINICAL DATA:  Altered mental status since last night. Hyperglycemia. History of chronic kidney disease and hypertension. EXAM: CT HEAD WITHOUT CONTRAST TECHNIQUE: Contiguous axial  images were obtained from the base of the skull through the vertex without intravenous contrast. COMPARISON:  Head CT 04/11/2016 and 10/06/2013. FINDINGS: Brain: There is no evidence of acute intracranial hemorrhage, mass lesion, brain edema or extra-axial fluid collection. The ventricles and subarachnoid spaces are appropriately sized for age. There is no CT evidence of acute cortical infarction. There are stable mild chronic small vessel ischemic changes in the periventricular white matter. Intracranial vascular calcifications are present. Bones/sinuses/visualized face: The visualized paranasal sinuses, mastoid air cells and middle ears are clear. The calvarium is intact. IMPRESSION: Stable head CT demonstrating mild small vessel ischemic changes. No acute intracranial findings. Electronically Signed   By: Richardean Sale M.D.   On: 05/06/2016 12:17   US Renal  05/07/2016  CLINICAL DATA:  Acute on chronic renal failure.  Initial encounter. EXAM: RENAL / URINARY TRACT ULTRASOUND COMPLETE COMPARISON:  CT of the abdomen and pelvis performed 11/19/2011, and renal ultrasound performed 09/30/2008 FINDINGS: Right Kidney: Length: 9.9 cm. Echogenicity within normal limits. No mass or hydronephrosis visualized. Left Kidney: Length: 9.4 cm. Echogenicity within normal limits. No mass or hydronephrosis visualized. Bladder: Decompressed, with a Foley catheter in place. IMPRESSION: Unremarkable renal ultrasound. Electronically Signed   By: Garald Balding M.D.   On: 05/07/2016 23:38   Dg Chest Port 1 View  05/08/2016  CLINICAL DATA:  Endotracheal tube position EXAM: PORTABLE CHEST 1 VIEW COMPARISON:  05/07/2016 FINDINGS: Endotracheal tube remains in good position. Right jugular central venous catheter tip in the mid SVC unchanged. NG tube in the stomach. Mild bibasilar airspace disease improved. Negative for heart failure or effusion. No edema. IMPRESSION: Endotracheal tube remains in good position. Improved aeration in  the  lung bases with decrease in bibasilar airspace disease. Electronically Signed   By: Franchot Gallo M.D.   On: 05/08/2016 07:50   Dg Chest Port 1 View  05/07/2016  CLINICAL DATA:  Respiratory failure. EXAM: PORTABLE CHEST 1 VIEW COMPARISON:  05/06/2016. FINDINGS: Endotracheal tube, NG tube, right IJ line stable position. Heart size normal. Bibasilar atelectasis and/or infiltrates. No pleural effusion or pneumothorax. Prior cervical spine fusion. IMPRESSION: 1.  Lines and tubes in stable position. 2.  Mild bibasilar atelectasis and/or infiltrates. Electronically Signed   By: Marcello Moores  Register   On: 05/07/2016 07:49   Dg Chest Portable 1 View  05/06/2016  CLINICAL DATA:  intubation Central line placement EXAM: PORTABLE CHEST 1 VIEW COMPARISON:  05/06/2016 at 1117 hours FINDINGS: Endotracheal tube terminates 5.2 cm above carina.Nasogastric tube extends beyond the inferior aspect of the film. Right internal jugular line tip at mid SVC. Midline trachea. Normal heart size. No pleural effusion or pneumothorax. Clear lungs. Cervical spine fixation. IMPRESSION: Appropriate position of endotracheal and right internal jugular lines. No pneumothorax or other acute complication. Electronically Signed   By: Abigail Miyamoto M.D.   On: 05/06/2016 14:39   Dg Chest Portable 1 View  05/06/2016  CLINICAL DATA:  Sepsis.  Altered mental status EXAM: PORTABLE CHEST 1 VIEW COMPARISON:  04/20/2014 FINDINGS: The heart size and mediastinal contours are within normal limits. Both lungs are clear. The visualized skeletal structures are unremarkable. IMPRESSION: No active disease. Electronically Signed   By: Franchot Gallo M.D.   On: 05/06/2016 11:44    Labs: BMET  Recent Labs Lab 05/06/16 1514 05/06/16 1609 05/06/16 1958 05/07/16 0130 05/07/16 0500 05/07/16 1100 05/07/16 1755 05/08/16 0400  NA  --  139 145 144 146* 144 142 142  K  --  4.4 2.7* 2.6* 2.7* 4.1 3.3* 3.6  CL  --  107 107 109 109 110 109 108  CO2  --  <7* 12*  19* '23 24 24 24  '$ GLUCOSE  --  465* 365* 219* 122* 103* 117* 131*  BUN  --  46* 40* 35* 36* 33* 33* 32*  CREATININE  --  3.88* 3.73* 3.45* 4.78* 3.08* 3.14* 2.97*  CALCIUM  --  7.4* 7.0* 6.8* 7.1* 6.7* 6.4* 6.8*  PHOS 10.4*  --   --   --  <1.0*  --   --  3.5   CBC  Recent Labs Lab 05/06/16 1113 05/06/16 1609 05/07/16 0500 05/08/16 0400  WBC 22.8* 22.8* 19.2* 16.9*  NEUTROABS 18.6*  --   --   --   HGB 10.6* 9.5* 9.5* 9.5*  HCT 34.8* 30.6* 27.6* 27.4*  MCV 104.8* 101.7* 93.2 91.3  PLT 252 235 211 187   Medications:    . antiseptic oral rinse  7 mL Mouth Rinse QID  . chlorhexidine gluconate (SAGE KIT)  15 mL Mouth Rinse BID  . folic acid  1 mg Intravenous Daily  . heparin  5,000 Units Subcutaneous Q8H  . levothyroxine  100 mcg Per Tube QAC breakfast  . pantoprazole (PROTONIX) IV  40 mg Intravenous Q12H  . piperacillin-tazobactam (ZOSYN)  IV  2.25 g Intravenous Q8H  . sodium chloride  500 mL Intravenous Once  . thiamine IV  100 mg Intravenous Daily  . vancomycin  500 mg Intravenous Q48H   Elmarie Shiley, MD 05/08/2016, 8:47 AM

## 2016-05-09 ENCOUNTER — Inpatient Hospital Stay (HOSPITAL_COMMUNITY): Payer: Medicare Other

## 2016-05-09 LAB — GLUCOSE, CAPILLARY
GLUCOSE-CAPILLARY: 132 mg/dL — AB (ref 65–99)
GLUCOSE-CAPILLARY: 137 mg/dL — AB (ref 65–99)
GLUCOSE-CAPILLARY: 176 mg/dL — AB (ref 65–99)
GLUCOSE-CAPILLARY: 30 mg/dL — AB (ref 65–99)
GLUCOSE-CAPILLARY: 45 mg/dL — AB (ref 65–99)
Glucose-Capillary: 94 mg/dL (ref 65–99)
Glucose-Capillary: 56 mg/dL — ABNORMAL LOW (ref 65–99)
Glucose-Capillary: 130 mg/dL — ABNORMAL HIGH (ref 65–99)
Glucose-Capillary: 110 mg/dL — ABNORMAL HIGH (ref 65–99)

## 2016-05-09 LAB — CBC
HEMATOCRIT: 26.2 % — AB (ref 36.0–46.0)
HEMOGLOBIN: 8.9 g/dL — AB (ref 12.0–15.0)
MCH: 31.8 pg (ref 26.0–34.0)
MCHC: 34 g/dL (ref 30.0–36.0)
MCV: 93.6 fL (ref 78.0–100.0)
Platelets: 146 10*3/uL — ABNORMAL LOW (ref 150–400)
RBC: 2.8 MIL/uL — AB (ref 3.87–5.11)
RDW: 14 % (ref 11.5–15.5)
WBC: 10.4 10*3/uL (ref 4.0–10.5)

## 2016-05-09 LAB — MAGNESIUM: MAGNESIUM: 2.4 mg/dL (ref 1.7–2.4)

## 2016-05-09 LAB — POCT I-STAT 3, ART BLOOD GAS (G3+)
Acid-base deficit: 6 mmol/L — ABNORMAL HIGH (ref 0.0–2.0)
Bicarbonate: 21 mEq/L (ref 20.0–24.0)
O2 Saturation: 97 %
PH ART: 7.274 — AB (ref 7.350–7.450)
TCO2: 22 mmol/L (ref 0–100)
pCO2 arterial: 45.5 mmHg — ABNORMAL HIGH (ref 35.0–45.0)
pO2, Arterial: 104 mmHg — ABNORMAL HIGH (ref 80.0–100.0)

## 2016-05-09 LAB — BASIC METABOLIC PANEL
Anion gap: 7 (ref 5–15)
BUN: 29 mg/dL — AB (ref 6–20)
CHLORIDE: 111 mmol/L (ref 101–111)
CO2: 22 mmol/L (ref 22–32)
CREATININE: 2.87 mg/dL — AB (ref 0.44–1.00)
Calcium: 6.5 mg/dL — ABNORMAL LOW (ref 8.9–10.3)
GFR calc non Af Amer: 16 mL/min — ABNORMAL LOW (ref >60)
GFR, EST AFRICAN AMERICAN: 18 mL/min — AB (ref >60)
Glucose, Bld: 48 mg/dL — ABNORMAL LOW (ref 65–99)
POTASSIUM: 3.4 mmol/L — AB (ref 3.5–5.1)
SODIUM: 140 mmol/L (ref 135–145)

## 2016-05-09 LAB — PHOSPHORUS: PHOSPHORUS: 3.5 mg/dL (ref 2.5–4.6)

## 2016-05-09 MED ORDER — CHLORHEXIDINE GLUCONATE 0.12 % MT SOLN
OROMUCOSAL | Status: AC
  Administered 2016-05-09: 15 mL via OROMUCOSAL
  Filled 2016-05-09: qty 15

## 2016-05-09 MED ORDER — POTASSIUM CHLORIDE 10 MEQ/100ML IV SOLN
10.0000 meq | INTRAVENOUS | Status: AC
  Administered 2016-05-09 (×4): 10 meq via INTRAVENOUS
  Filled 2016-05-09 (×4): qty 100

## 2016-05-09 MED ORDER — DEXTROSE 50 % IV SOLN
INTRAVENOUS | Status: AC
  Administered 2016-05-09: 50 mL
  Filled 2016-05-09: qty 50

## 2016-05-09 MED ORDER — METOLAZONE 5 MG PO TABS: 10.0000 mg | ORAL_TABLET | Freq: Once | ORAL | Status: DC

## 2016-05-09 MED ORDER — POTASSIUM CHLORIDE 10 MEQ/50ML IV SOLN: 10.0000 meq | INTRAVENOUS | Status: DC | PRN

## 2016-05-09 MED ORDER — DEXTROSE 50 % IV SOLN
50.0000 mL | Freq: Once | INTRAVENOUS | Status: AC
  Administered 2016-05-09: 50 mL via INTRAVENOUS

## 2016-05-09 MED ORDER — INSULIN ASPART 100 UNIT/ML ~~LOC~~ SOLN: 0.0000 [IU] | SUBCUTANEOUS | Status: DC

## 2016-05-09 MED ORDER — LORAZEPAM 2 MG/ML PO CONC
0.5000 mg | Freq: Two times a day (BID) | ORAL | Status: DC
  Administered 2016-05-09 – 2016-05-10 (×2): 0.5 mg via ORAL
  Filled 2016-05-09 (×2): qty 1

## 2016-05-09 MED ORDER — DEXTROSE 50 % IV SOLN
INTRAVENOUS | Status: AC
  Administered 2016-05-09: 50 mL via INTRAVENOUS
  Filled 2016-05-09: qty 50

## 2016-05-09 MED ORDER — FUROSEMIDE 10 MG/ML IJ SOLN
40.0000 mg | Freq: Four times a day (QID) | INTRAMUSCULAR | Status: AC
  Administered 2016-05-09 – 2016-05-10 (×3): 40 mg via INTRAVENOUS
  Filled 2016-05-09 (×2): qty 4

## 2016-05-09 NOTE — Progress Notes (Signed)
PULMONARY / CRITICAL CARE MEDICINE   Name: Cassandra [name] MRN: [id number] DOB: Jun 18, 1946    ADMISSION DATE:  05/06/2016 CONSULTATION DATE:  05/06/2016  REFERRING MD:  Dr. Criss AlvineGoldston  CHIEF COMPLAINT:  DKA   HISTORY OF PRESENT ILLNESS:   This is a 70 y.o. female with PMH of Type 1 Diabetes mellitus, Hypothyroidism, Hypertension, CKD stage 3 (GFR 30-59 ml/min), Tobacco use, GERD, elevated liver enzymes (Hep B/C neg 2014).  She presents to the ER 05/06/2016 with altered mental status onset last night. On a good day pt is normally ambulatory and performs ADL's independently. Pt has been complaining of frequent migraines and vertigo for about the last year she is followed by ENT. Husband endorses a similar episode of confusion about 8 years ago, which turned out to be caused by a blood sugar of over 1200. Patient is also a chronic alcohol user, husband states pt drinks everyday throughout the day for the past 10-15 years, last alcoholic beverage per husband was 05/05/2016.  According to pts. husband pt began acting different around 5 or 6 pm 05/05/2016 with slight confusion.  Pt tossed and mumbled throughout the night. On 05/06/2016 she had persistent confusion and the husband was unsure as to what he should do. Husband then received a call from the patient's ENT physicians office notifying him that the patient missed an appointment this morning. When he told them what was going on, they advised him to call 911. Upon EMS arrival CBG reading "High," pts. last dose of insulin was Sunday morning 05/05/2016.  PCCM consulted 05/06/2016 for management of acute hypoxic respiratory failure, acute encephalopathy and DKA   SUBJECTIVE:  Vomited. Hypoglycemic, agitated at times   VITAL SIGNS: BP 192/63 mmHg  Pulse 83  Temp(Src) 99 F (37.2 C) (Axillary)  Resp 14  Ht 5\' 5"  (1.651 m)  Wt 131 lb 13.4 oz (59.8 kg)  BMI 21.94 kg/m2  SpO2 100%  HEMODYNAMICS: CVP:  [9 mmHg-28 mmHg] 10 mmHg  VENTILATOR  SETTINGS: Vent Mode:  [-] PRVC FiO2 (%):  [40 %] 40 % Set Rate:  [14 bmp] 14 bmp Vt Set:  [480 mL] 480 mL PEEP:  [5 cmH20] 5 cmH20 Pressure Support:  [5 cmH20] 5 cmH20 Plateau Pressure:  [18 cmH20-26 cmH20] 26 cmH20  INTAKE / OUTPUT: I/O last 3 completed shifts: In: 4035.1 [I.V.:3425.1; NG/GT:360; IV Piggyback:250] Out: 905 [Urine:905]  PHYSICAL EXAMINATION: General:  Critically ill appearing caucasion female, follows commands at times Neuro:  does  follow commands, bilateral pupils 3 mm equal and reactive HEENT:  Supple, no JVD Cardiovascular:  SR, s1s2, no murmur Lungs: rhonchi throughout, diminished in bases Abdomen: +BS x4,  distended,  Tender. ogt to lwis Musculoskeletal:  Moves all extremities, normal bulk Skin:  intact  LABS:  BMET  Recent Labs Lab 05/08/16 0400 05/08/16 1300 05/09/16 0413  NA 142 140 140  K 3.6 3.6 3.4*  CL 108 110 111  CO2 24 23 22   BUN 32* 30* 29*  CREATININE 2.97* 3.02* 2.87*  GLUCOSE 131* 158* 48*    Electrolytes  Recent Labs Lab 05/07/16 0500  05/08/16 0400 05/08/16 1300 05/09/16 0413  CALCIUM 7.1*  < > 6.8* 6.5* 6.5*  MG 1.4*  --  2.5*  --  2.4  PHOS <1.0*  --  3.5  --  3.5  < > = values in this interval not displayed.  CBC  Recent Labs Lab 05/07/16 0500 05/08/16 0400 05/09/16 0413  WBC 19.2* 16.9* 10.4  HGB 9.5* 9.5*  8.9*  HCT 27.6* 27.4* 26.2*  PLT 211 187 146*    Coag's  Recent Labs Lab 05/06/16 1113  INR 1.13    Sepsis Markers  Recent Labs Lab 05/06/16 1126 05/06/16 1513 05/06/16 1918 05/06/16 2235 05/07/16 0500 05/08/16 0400  LATICACIDVEN 1.63  --  2.3* 2.2*  --   --   PROCALCITON  --  0.69  --   --  2.10 1.63    ABG  Recent Labs Lab 05/07/16 0500 05/08/16 0403 05/09/16 0437  PHART 7.626* 7.429 7.274*  PCO2ART 21.7* 35.2 45.5*  PO2ART 169.0* 160* 104.0*    Liver Enzymes  Recent Labs Lab 05/06/16 1113 05/08/16 0400  AST 34  --   ALT 15  --   ALKPHOS 50  --   BILITOT 1.6*   --   ALBUMIN 3.3* 1.8*    Cardiac Enzymes No results for input(s): TROPONINI, PROBNP in the last 168 hours.  Glucose  Recent Labs Lab 05/08/16 1604 05/08/16 1918 05/08/16 2358 05/09/16 0410 05/09/16 0412 05/09/16 0439  GLUCAP 162* 129* 176* 45* 40* 137*    Imaging Dg Chest Port 1 View  05/09/2016  CLINICAL DATA:  Respiratory failure. EXAM: PORTABLE CHEST 1 VIEW COMPARISON:  05/08/2016.  05/07/2016 . FINDINGS: Endotracheal tube, NG tube, right IJ line stable position. Heart size stable. Mild bibasilar atelectasis and/or infiltrates again noted. No prominent pleural effusion. No pneumothorax. Prior cervical spine fusion IMPRESSION: 1. Lines and tubes in stable position. 2. Mild bibasilar atelectasis and/or infiltrates again noted. No interim change. Electronically Signed   By: Maisie Fus  Register   On: 05/09/2016 07:47     STUDIES:  Ct of Head 5/22>>negative Urine drug screen 5/22>>negative   CULTURES: Blood 5/22>> Urine 5/22>>insig growth   ANTIBIOTICS: Vancomycin 5/22>>5/25 Zosyn 5/22>  SIGNIFICANT EVENTS: 5/22>>Pt arrived via EMS with AMS and elevated blood glucose level 5/22>>Pt intubated in ED secondary to acute hypoxic respiratory distress 5/23 repletion of electrolytes   5/25 vomited>>  LINES/TUBES: ETT>>5/22 Right IJ CVC>>5/22 R radial a-line 5/22>>>5/25  DISCUSSION: This is a 70 y.o. female with PMH of Type 1 Diabetes mellitus, Hypothyroidism, Hypertension, CKD stage 3 (GFR 30-59 ml/min), Tobacco use, GERD, elevated liver enzymes (Hep B/C neg 2014).  She presents to the ER 05/06/2016 with altered mental status. Hypotensive and in metabolic disarray. Monitor for ETOH withdrawal .  Resumed home ativan to help with agitation.  ASSESSMENT / PLAN:  PULMONARY A: Acute hypoxic respiratory failure  Underlying COPD Tobacco abuse P:   Full vent support Repeat ABG's as needed CXR in am Titrate O2 for sat of 88-92% Wean as tolerated  CARDIOVASCULAR A:  Hx:  Hypertension with CKD-controlled P:  Serial troponin's Maintain map >65 Levophed for SBP support, off 5/25 Follow CVP, now 14  RENAL Lab Results  Component Value Date   CREATININE 2.87* 05/09/2016   CREATININE 3.02* 05/08/2016   CREATININE 2.97* 05/08/2016   CREATININE 1.17 06/15/2014   CREATININE 0.74 10/09/2013   CREATININE 0.67 10/08/2013    Recent Labs Lab 05/08/16 0400 05/08/16 1300 05/09/16 0413  K 3.6 3.6 3.4*     A:   AKI (Hx: CKD baseline creatinine 1.10-1.50) Hyperkalemia P:    Replace electrolytes as indicated, Phos, K, Mg being repeleted Monitor urinary output, improved 5/24, continue IVF  GASTROINTESTINAL A:   Hx: GERD, Elevated liver enzymes (Hep B/C neg 2004) Vomiting P:   PPI for SUP Keep NPO for now Vomited 5/25, check kub, LWIS to ogt  HEMATOLOGIC A:   No  problems identified P:  Heparin/SCD for VTE prophylaxis    Monitor for s/s of bleeding Transfuse for HgB <7 Trend CBC's   INFECTIOUS A:   UTI P:   Continue abx as listed above,dc vanco Monitor cultures Trend WBC's and monitor fever curve Trend Procalcitonin's . 69->2.10  ENDOCRINE CBG (last 3)   Recent Labs  05/09/16 0410 05/09/16 0412 05/09/16 0439  GLUCAP 45* 40* 137*     A:   DKA P:   Transition to ICU glucose protocol. AG closed Stopped lantus 5/25 after vomiting and hypoglycemic event   NEUROLOGIC A:   Acute encephalopathy Hx: Migraines, Bipolar Disorder, ETOH abuse, Benign paroxysmal positional vertigo P:   RASS goal: 0 to -1 Monitor s/sx of ETOH withdrawl  Resume outpatient ativan, Prn fentanyl , precedex and versed to maintain RASS goal Thiamine/folate/MVI Daily wakeup assessment   FAMILY  - Updates: no family at bedside  [facility] ACNP Adolph Pollack PCCM Pager [phone] till 3 pm If no answer page [phone]  Attending Note:  70 year old female with extensive PMH presenting with DKA, ARF and respiratory failure. Gap is closed, off insulin  drip, Lantus started and TF. Off pressors and Cr plateaued and UOP is improving. Renal service following and appreciate input, no dialysis for now. BP holding evern with precedex (causing some mild bradycardia but no hemodynamic compromise).  Will begin lasix to assist with vent weaning efforts and anticipate will be able to wean in AM if diureses. Procalcitonin is 2.1, will continue abx for now. Family updated bedside.  The patient is critically ill with multiple organ systems failure and requires high complexity decision making for assessment and support, frequent evaluation and titration of therapies, application of advanced monitoring technologies and extensive interpretation of multiple databases.   Critical Care Time devoted to patient care services described in this note is 35 Minutes. This time reflects time of care of this signee Dr [name]. This critical care time does not reflect procedure time, or teaching time or supervisory time of PA/NP/Med student/Med Resident etc but could involve care discussion time.  Alyson Reedy, M.D. [facility] Pulmonary/Critical Care Medicine. Pager: [phone]. After hours pager: [phone].

## 2016-05-09 NOTE — Progress Notes (Signed)
Pharmacy Antibiotic Note  Cassandra [name] is a 70 y.o. female admitted on 05/06/2016 with presumed urosepsis. Today is day #4 of abx. WBC down to wnl, SCr 2.7 > 2.8, eCrCl ~ 15-20 ml/min.  Plan: Zosyn 2.25g IV q8h Monitor cultures, renal fx, duration of therapy   Height: 5\' 5"  (165.1 cm) Weight: 131 lb 13.4 oz (59.8 kg) IBW/kg (Calculated) : 57  Temp (24hrs), Avg:98.5 F (36.9 C), Min:97.7 F (36.5 C), Max:99.2 F (37.3 C)   Recent Labs Lab 05/06/16 1113 05/06/16 1126 05/06/16 1609 05/06/16 1918  05/06/16 2235  05/07/16 0500 05/07/16 1100 05/07/16 1755 05/08/16 0400 05/08/16 1300 05/09/16 0413  WBC 22.8*  --  22.8*  --   --   --   --  19.2*  --   --  16.9*  --  10.4  CREATININE 4.17*  --  3.88*  --   < >  --   < > 4.78* 3.08* 3.14* 2.97* 3.02* 2.87*  LATICACIDVEN  --  1.63  --  2.3*  --  2.2*  --   --   --   --   --   --   --   < > = values in this interval not displayed.  Estimated Creatinine Clearance: 16.9 mL/min (by C-G formula based on Cr of 2.87).    Allergies  Allergen Reactions  . Ciprofloxacin Other (See Comments)    No reaction specified  . Codeine Diarrhea and Nausea And Vomiting  . Doxycycline Diarrhea and Nausea And Vomiting  . Hydrocodone   . Omnicef [Cefdinir] Nausea Only and Other (See Comments)    constipation    Antimicrobials this admission: 5/22 vanc >> 5/25 5/22 zosyn >>   Dose adjustments this admission: NA  Microbiology results: 5/22 BCx: ngtd 5/22 UCx: insignificant growth     Agapito GamesAlison Tarence Searcy, PharmD, BCPS Clinical Pharmacist 05/09/2016 2:09 PM

## 2016-05-09 NOTE — Progress Notes (Signed)
RT called to patient's room. She had self extubated. RN had placed her on 100% NRB and she looked very comfortable. Weaned down to 4 LNC and patient looks ok. MD aware. RT will monitor as needed

## 2016-05-09 NOTE — Progress Notes (Signed)
Patient ID: Cassandra [name], female   DOB: [birth year], 70 y.o.   MRN: [id number]  [facility] Progress Note   Assessment/ Plan:   1. Acute renal failure on chronic kidney disease stage III: Oliguric and with evidence now pointing towards ATN likely mediated by DKA/sepsis-without any uremic signs or acute needs for intervention with hemodialysis at this point. Urine output appears to be improving slowly and renal function continues to show sluggish improvement. Without any acute dialysis needs at this time-continue to follow closely with labs/physical exam while monitoring blood pressure and limiting hypotension/potential nephrotoxins exposure. 2. Diabetic ketoacidosis: Hypoglycemic this morning, adjusting long-acting and short-acting insulin dosing. 3. Septic shock: The exact source/etiology at this time remains unclear, she is on empiric antibiotic therapy with Zosyn and vancomycin. Weaned off pressors. 4. Anemia: Likely chronic from chronic alcohol use/malnutrition. Getting intravenous iron 5. Chronic alcohol abuse: Sequela of chronic alcoholism-multiple electrolyte abnormalities and malnutrition. On withdrawal watch/protocol.  Subjective:   Agitated overnight, no acute clinical events.    Objective:   BP 155/74 mmHg  Pulse 82  Temp(Src) 99 F (37.2 C) (Axillary)  Resp 14  Ht '5\' 5"'$  (1.651 m)  Wt 59.8 kg (131 lb 13.4 oz)  BMI 21.94 kg/m2  SpO2 100%  Intake/Output Summary (Last 24 hours) at 05/09/16 0851 Last data filed at 05/09/16 0800  Gross per 24 hour  Intake 2641.22 ml  Output    675 ml  Net 1966.22 ml   Weight change: -0.6 kg (-1 lb 5.2 oz)  Physical Exam: [id number]:[id number], appears comfortable on ventilator CVS: Regular rhythm, normal rate Resp: Coarse breath sounds-no rales/rhonchi Abd: Soft, slightly distended, nontender Ext: No lower extremity edema  Imaging: US Renal  05/07/2016  CLINICAL DATA:  Acute on chronic renal failure.  Initial encounter. EXAM:  RENAL / URINARY TRACT ULTRASOUND COMPLETE COMPARISON:  CT of the abdomen and pelvis performed 11/19/2011, and renal ultrasound performed 09/30/2008 FINDINGS: Right Kidney: Length: 9.9 cm. Echogenicity within normal limits. No mass or hydronephrosis visualized. Left Kidney: Length: 9.4 cm. Echogenicity within normal limits. No mass or hydronephrosis visualized. Bladder: Decompressed, with a Foley catheter in place. IMPRESSION: Unremarkable renal ultrasound. Electronically Signed   By: Garald Balding M.D.   On: 05/07/2016 23:38   Dg Chest Port 1 View  05/09/2016  CLINICAL DATA:  Respiratory failure. EXAM: PORTABLE CHEST 1 VIEW COMPARISON:  05/08/2016.  05/07/2016 . FINDINGS: Endotracheal tube, NG tube, right IJ line stable position. Heart size stable. Mild bibasilar atelectasis and/or infiltrates again noted. No prominent pleural effusion. No pneumothorax. Prior cervical spine fusion IMPRESSION: 1. Lines and tubes in stable position. 2. Mild bibasilar atelectasis and/or infiltrates again noted. No interim change. Electronically Signed   By: Marcello Moores  Register   On: 05/09/2016 07:47   Dg Chest Port 1 View  05/08/2016  CLINICAL DATA:  Endotracheal tube position EXAM: PORTABLE CHEST 1 VIEW COMPARISON:  05/07/2016 FINDINGS: Endotracheal tube remains in good position. Right jugular central venous catheter tip in the mid SVC unchanged. NG tube in the stomach. Mild bibasilar airspace disease improved. Negative for heart failure or effusion. No edema. IMPRESSION: Endotracheal tube remains in good position. Improved aeration in the lung bases with decrease in bibasilar airspace disease. Electronically Signed   By: Franchot Gallo M.D.   On: 05/08/2016 07:50    Labs: BMET  Recent Labs Lab 05/06/16 1514  05/07/16 0130 05/07/16 0500 05/07/16 1100 05/07/16 1755 05/08/16 0400 05/08/16 1300 05/09/16 0413  NA  --   < >  144 146* 144 142 142 140 140  K  --   < > 2.6* 2.7* 4.1 3.3* 3.6 3.6 3.4*  CL  --   < > 109 109  110 109 108 110 111  CO2  --   < > 19* '23 24 24 24 23 22  '$ GLUCOSE  --   < > 219* 122* 103* 117* 131* 158* 48*  BUN  --   < > 35* 36* 33* 33* 32* 30* 29*  CREATININE  --   < > 3.45* 4.78* 3.08* 3.14* 2.97* 3.02* 2.87*  CALCIUM  --   < > 6.8* 7.1* 6.7* 6.4* 6.8* 6.5* 6.5*  PHOS 10.4*  --   --  <1.0*  --   --  3.5  --  3.5  < > = values in this interval not displayed. CBC  Recent Labs Lab 05/06/16 1113 05/06/16 1609 05/07/16 0500 05/08/16 0400 05/09/16 0413  WBC 22.8* 22.8* 19.2* 16.9* 10.4  NEUTROABS 18.6*  --   --   --   --   HGB 10.6* 9.5* 9.5* 9.5* 8.9*  HCT 34.8* 30.6* 27.6* 27.4* 26.2*  MCV 104.8* 101.7* 93.2 91.3 93.6  PLT 252 235 211 187 146*   Medications:    . antiseptic oral rinse  7 mL Mouth Rinse QID  . chlorhexidine gluconate (SAGE KIT)  15 mL Mouth Rinse BID  . feeding supplement (GLUCERNA 1.2 CAL)  1,000 mL Per Tube Q24H  . folic acid  1 mg Intravenous Daily  . heparin  5,000 Units Subcutaneous Q8H  . insulin aspart  0-15 Units Subcutaneous Q4H  . insulin glargine  10 Units Subcutaneous QHS  . levothyroxine  100 mcg Per Tube QAC breakfast  . multivitamin  15 mL Per Tube Daily  . pantoprazole (PROTONIX) IV  40 mg Intravenous Q12H  . piperacillin-tazobactam (ZOSYN)  IV  2.25 g Intravenous Q8H  . sodium chloride  500 mL Intravenous Once  . thiamine IV  100 mg Intravenous Daily  . vancomycin  500 mg Intravenous Q48H   Elmarie Shiley, MD 05/09/2016, 8:51 AM

## 2016-05-09 NOTE — Significant Event (Signed)
Responded to ventilator alarms, nursing staff in room with patient who had self-extubated. Patient is with intact bilateral mittens when she self-extubated. VS stable.   Placed on NRB, then on nasal cannula by RRT. Dr. Molli KnockYacoub made aware and is at bedside. Patient is drowsy, able to state her name. VS stable.

## 2016-05-09 NOTE — Progress Notes (Signed)
Am ABG called to E-link MD. No orders received

## 2016-05-09 NOTE — Progress Notes (Signed)
Hypoglycemic Event  CBG: 40  Treatment: D50 IV 50 mL  Symptoms: Sweaty  Follow-up CBG: Time:0430 CBG Result:137  Possible Reasons for Event: Medication regimen: pm long-acting insulin  Comments/MD notified:no   Cassandra [name], Lollie MarrowWilliam Darnell

## 2016-05-09 NOTE — Care Management Note (Signed)
Case Management Note  Patient Details  Name: Merril AbbeBillie A Gerry MRN: [id number] Date of Birth: [birth year]  Subjective/Objective:   Lengthy conversation with 2 dtrs whose main concern is pt's ETOH abuse.  Pt lives with spouse and, according to dtrs, he also has an ETOH abuse problem.  They want pt to be discharged to SNF for rehab and are hopeful that she will stop ETOH use when she returns home after rehab.  Informed dtrs that pt will need to agree with SNF unless she is deemed incapable of making decisions and also that insurance will need to authorize transfer to SNF.  They demonstrate understanding.  Provided list of facilties that are in network with pt's insurance per request.  Also explained [facility] services and provided list of area meetings for dtrs.                           Expected Discharge Plan:  Skilled Nursing Facility  In-House Referral:  Clinical Social Work  Discharge planning Services  CM Consult  Status of Service:  In process, will continue to follow  Medicare Important Message Given:  Yes  Magdalene RiverMayo, Perry Molla T, RN 05/09/2016, 10:39 AM

## 2016-05-09 NOTE — Progress Notes (Addendum)
Attempted to call daughter x2, Nicholaus BloomKelley, to update about pt's self extubation. No answer. Will continue to monitor pt closely.

## 2016-05-10 ENCOUNTER — Inpatient Hospital Stay (HOSPITAL_COMMUNITY): Payer: Medicare Other

## 2016-05-10 DIAGNOSIS — F411 Generalized anxiety disorder: Secondary | ICD-10-CM | POA: Diagnosis present

## 2016-05-10 LAB — BASIC METABOLIC PANEL
Anion gap: 10 (ref 5–15)
BUN: 24 mg/dL — AB (ref 6–20)
CALCIUM: 6.7 mg/dL — AB (ref 8.9–10.3)
CO2: 22 mmol/L (ref 22–32)
CREATININE: 2.62 mg/dL — AB (ref 0.44–1.00)
Chloride: 105 mmol/L (ref 101–111)
GFR calc Af Amer: 20 mL/min — ABNORMAL LOW (ref >60)
GFR, EST NON AFRICAN AMERICAN: 18 mL/min — AB (ref >60)
GLUCOSE: 246 mg/dL — AB (ref 65–99)
POTASSIUM: 4.9 mmol/L (ref 3.5–5.1)
SODIUM: 137 mmol/L (ref 135–145)

## 2016-05-10 LAB — GLUCOSE, CAPILLARY
GLUCOSE-CAPILLARY: 100 mg/dL — AB (ref 65–99)
GLUCOSE-CAPILLARY: 156 mg/dL — AB (ref 65–99)
Glucose-Capillary: 190 mg/dL — ABNORMAL HIGH (ref 65–99)
Glucose-Capillary: 104 mg/dL — ABNORMAL HIGH (ref 65–99)
Glucose-Capillary: 343 mg/dL — ABNORMAL HIGH (ref 65–99)
Glucose-Capillary: 335 mg/dL — ABNORMAL HIGH (ref 65–99)

## 2016-05-10 LAB — CBC
HCT: 27 % — ABNORMAL LOW (ref 36.0–46.0)
Hemoglobin: 8.8 g/dL — ABNORMAL LOW (ref 12.0–15.0)
MCH: 32.1 pg (ref 26.0–34.0)
MCHC: 32.6 g/dL (ref 30.0–36.0)
MCV: 98.5 fL (ref 78.0–100.0)
PLATELETS: 152 10*3/uL (ref 150–400)
RBC: 2.74 MIL/uL — AB (ref 3.87–5.11)
RDW: 14.3 % (ref 11.5–15.5)
WBC: 10.9 10*3/uL — AB (ref 4.0–10.5)

## 2016-05-10 LAB — PHOSPHORUS: PHOSPHORUS: 5.6 mg/dL — AB (ref 2.5–4.6)

## 2016-05-10 LAB — MAGNESIUM: Magnesium: 2 mg/dL (ref 1.7–2.4)

## 2016-05-10 LAB — AMYLASE: Amylase: 55 U/L (ref 28–100)

## 2016-05-10 LAB — LIPASE, BLOOD: LIPASE: 21 U/L (ref 11–51)

## 2016-05-10 MED ORDER — ESCITALOPRAM OXALATE 10 MG PO TABS
10.0000 mg | ORAL_TABLET | Freq: Every day | ORAL | Status: DC
  Administered 2016-05-10 – 2016-05-18 (×9): 10 mg via ORAL
  Filled 2016-05-10 (×9): qty 1

## 2016-05-10 MED ORDER — PNEUMOCOCCAL VAC POLYVALENT 25 MCG/0.5ML IJ INJ
0.5000 mL | INJECTION | INTRAMUSCULAR | Status: DC
  Filled 2016-05-10: qty 0.5

## 2016-05-10 MED ORDER — BUSPIRONE HCL 15 MG PO TABS: 7.5000 mg | ORAL_TABLET | Freq: Two times a day (BID) | ORAL | Status: DC

## 2016-05-10 MED ORDER — METOCLOPRAMIDE HCL 5 MG/ML IJ SOLN
5.0000 mg | Freq: Two times a day (BID) | INTRAMUSCULAR | Status: DC
  Administered 2016-05-10 – 2016-05-11 (×3): 5 mg via INTRAVENOUS
  Filled 2016-05-10 (×3): qty 2

## 2016-05-10 MED ORDER — SERTRALINE HCL 25 MG PO TABS: 25.0000 mg | ORAL_TABLET | Freq: Every day | ORAL | Status: DC

## 2016-05-10 MED ORDER — LEVOTHYROXINE SODIUM 100 MCG IV SOLR
50.0000 ug | Freq: Every day | INTRAVENOUS | Status: DC
  Administered 2016-05-10: 50 ug via INTRAVENOUS
  Filled 2016-05-10 (×2): qty 5

## 2016-05-10 MED ORDER — FUROSEMIDE 10 MG/ML IJ SOLN
40.0000 mg | Freq: Four times a day (QID) | INTRAMUSCULAR | Status: AC
  Administered 2016-05-10 (×3): 40 mg via INTRAVENOUS
  Filled 2016-05-10 (×3): qty 4

## 2016-05-10 MED ORDER — SODIUM CHLORIDE 0.9 % IV SOLN
INTRAVENOUS | Status: DC
  Administered 2016-05-10: 20:00:00 via INTRAVENOUS

## 2016-05-10 MED ORDER — ACETAMINOPHEN 325 MG PO TABS
650.0000 mg | ORAL_TABLET | ORAL | Status: DC | PRN
  Administered 2016-05-10 – 2016-05-18 (×9): 650 mg via ORAL
  Filled 2016-05-10 (×9): qty 2

## 2016-05-10 MED ORDER — LORAZEPAM 2 MG/ML IJ SOLN
0.5000 mg | Freq: Four times a day (QID) | INTRAMUSCULAR | Status: DC | PRN
  Administered 2016-05-10 – 2016-05-13 (×9): 0.5 mg via INTRAVENOUS
  Filled 2016-05-10 (×9): qty 1

## 2016-05-10 NOTE — Progress Notes (Signed)
PULMONARY / CRITICAL CARE MEDICINE   Name:Cassandra [name] ZOX:[id number] DOB:[birth year]   ADMISSION DATE: 05/06/2016 CONSULTATION DATE: 05/06/2016  REFERRING MD: Dr. Criss Alvine  CHIEF COMPLAINT: DKA   HISTORY OF PRESENT ILLNESS:  This is a 70 y.o. female with PMH of Type 1 Diabetes mellitus, Hypothyroidism, Hypertension, CKD stage 3 (GFR 30-59 ml/min), Tobacco use, GERD, elevated liver enzymes (Hep B/C neg 2014). She presents to the ER 05/06/2016 with altered mental status onset last night. On a good day pt is normally ambulatory and performs ADL's independently. Pt has been complaining of frequent migraines and vertigo for about the last year she is followed by ENT. Husband endorses a similar episode of confusion about 8 years ago, which turned out to be caused by a blood sugar of over 1200. Patient is also a chronic alcohol user, husband states pt drinks everyday throughout the day for the past 10-15 years, last alcoholic beverage per husband was 05/05/2016. According to pts. husband pt began acting different around 5 or 6 pm 05/05/2016 with slight confusion. Pt tossed and mumbled throughout the night. On 05/06/2016 she had persistent confusion and the husband was unsure as to what he should do. Husband then received a call from the patient's ENT physicians office notifying him that the patient missed an appointment this morning. When he told them what was going on, they advised him to call 911. Upon EMS arrival CBG reading "High," pts. last dose of insulin was Sunday morning 05/05/2016. PCCM consulted 05/06/2016 for management of acute hypoxic respiratory failure, acute encephalopathy and DKA   SUBJECTIVE:  Vomited. Hypoglycemic, agitated at times   VITAL SIGNS: BP 155/79 mmHg  Pulse 92  Temp(Src) 98 F (36.7 C) (Oral)  Resp 15  Ht 5\' 5"  (1.651 m)  Wt 132 lb 4.4 oz (60 kg)  BMI 22.01 kg/m2  SpO2 99%  HEMODYNAMICS: CVP: [9 mmHg-16 mmHg] 13 mmHg  VENTILATOR  SETTINGS: Vent Mode: [-] PRVC FiO2 (%): [40 %] 40 % Set Rate: [14 bmp] 14 bmp Vt Set: [480 mL] 480 mL PEEP: [5 cmH20] 5 cmH20 Plateau Pressure: [26 cmH20] 26 cmH20  INTAKE / OUTPUT: I/O last 3 completed shifts: In: 3531.4 [I.V.:2841.4; NG/GT:90; IV Piggyback:600] Out: 2645 [Urine:2345; Emesis/NG output:300]  PHYSICAL EXAMINATION: General: Awake and alert. CO abd pain Neuro: Intact HEENT: Supple, no JVD Cardiovascular: SR, s1s2, no murmur Lungs: diminished in bases Abdomen: +BS x4, distended, Tender. Co abd pain KUB non obstructive gas pattern Musculoskeletal: Moves all extremities, normal bulk Skin: intact  LABS:  BMET  Last Labs      Recent Labs Lab 05/08/16 1300 05/09/16 0413 05/10/16 0353  NA 140 140 137  K 3.6 3.4* 4.9  CL 110 111 105  CO2 23 22 22   BUN 30* 29* 24*  CREATININE 3.02* 2.87* 2.62*  GLUCOSE 158* 48* 246*      Electrolytes  Last Labs      Recent Labs Lab 05/08/16 0400 05/08/16 1300 05/09/16 0413 05/10/16 0353  CALCIUM 6.8* 6.5* 6.5* 6.7*  MG 2.5* --  2.4 2.0  PHOS 3.5 --  3.5 5.6*      CBC  Last Labs      Recent Labs Lab 05/08/16 0400 05/09/16 0413 05/10/16 0353  WBC 16.9* 10.4 10.9*  HGB 9.5* 8.9* 8.8*  HCT 27.4* 26.2* 27.0*  PLT 187 146* 152      Coag's  Last Labs      Recent Labs Lab 05/06/16 1113  INR 1.13      Sepsis  Markers  Last Labs      Recent Labs Lab 05/06/16 1126 05/06/16 1513 05/06/16 1918 05/06/16 2235 05/07/16 0500 05/08/16 0400  LATICACIDVEN 1.63 --  2.3* 2.2* --  --   PROCALCITON --  0.69 --  --  2.10 1.63      ABG  Last Labs      Recent Labs Lab 05/07/16 0500 05/08/16 0403 05/09/16 0437  PHART 7.626* 7.429 7.274*  PCO2ART 21.7* 35.2 45.5*  PO2ART 169.0* 160* 104.0*      Liver Enzymes  Last Labs      Recent Labs Lab 05/06/16 1113  05/08/16 0400  AST 34 --   ALT 15 --   ALKPHOS 50 --   BILITOT 1.6* --   ALBUMIN 3.3* 1.8*      Cardiac Enzymes  Last Labs     No results for input(s): TROPONINI, PROBNP in the last 168 hours.    Glucose  Last Labs      Recent Labs Lab 05/09/16 1453 05/09/16 1544 05/09/16 1944 05/09/16 2348 05/10/16 0408 05/10/16 0859  GLUCAP 130* 132* 110* 156* 190* 100*      Imaging Dg Chest Port 1 View  05/10/2016 CLINICAL DATA: Respiratory failure. EXAM: PORTABLE CHEST 1 VIEW COMPARISON: 05/09/2016. FINDINGS: Interim removal of endotracheal tube and NG tube. Right IJ line in stable position. Heart size normal. Low lung volumes with mild bibasilar atelectasis and/or infiltrate. Small left pleural effusion. Prior cervical spine fusion. IMPRESSION: 1. Interim removal endotracheal tube and NG tube. Right IJ line stable position. 2. Low lung volumes with mild bibasilar atelectasis and/or infiltrates again noted. Electronically Signed By: Maisie Fushomas Register On: 05/10/2016 07:42   Dg Abd Portable 1v  05/09/2016 CLINICAL DATA: VOMITING EXAM: PORTABLE ABDOMEN - 1 VIEW COMPARISON: 11/19/2011 FINDINGS: Nasogastric tube is in place, tip overlying the level of the distal stomach or proximal duodenum. Surgical clips are identified in the right upper quadrant of the abdomen. Nonobstructive bowel gas pattern. Visualized osseous structures have a normal appearance. IMPRESSION: Nasogastric tube tip overlying the distal stomach or proximal duodenum. Electronically Signed By: Norva PavlovElizabeth Brown M.D. On: 05/09/2016 13:11     STUDIES:  Ct of Head 5/22>>negative Urine drug screen 5/22>>negative   CULTURES: Blood 5/22>> Urine 5/22>>insig growth   ANTIBIOTICS: Vancomycin 5/22>>5/25 Zosyn 5/22>  SIGNIFICANT EVENTS: 5/22>>Pt arrived via EMS with AMS and elevated blood glucose level 5/22>>Pt intubated in ED secondary to acute hypoxic respiratory  distress 5/23 repletion of electrolytes  5/25 vomited>> 5/25 self extubation  LINES/TUBES: ETT>>5/22>>5/25 self extubation Right IJ CVC>>5/22 R radial a-line 5/22>>>5/25  DISCUSSION: This is a 70 y.o. female with PMH of Type 1 Diabetes mellitus, Hypothyroidism, Hypertension, CKD stage 3 (GFR 30-59 ml/min), Tobacco use, GERD, elevated liver enzymes (Hep B/C neg 2014). She presents to the ER 05/06/2016 with altered mental status. Hypotensive and in metabolic disarray. Monitor for ETOH withdrawal . Resumed home ativan to help with agitation.  ASSESSMENT / PLAN:  PULMONARY A: Acute hypoxic respiratory failure  Underlying COPD Tobacco abuse P:  Self extubated 5/25 On room air  CARDIOVASCULAR A:  Hx: Hypertension with CKD-controlled P:  Serial troponin's-> Maintain map >65 Levophed for SBP support, off 5/25 Follow CVP, now 13  RENAL  Labs (Brief)    Lab Results  Component Value Date   CREATININE 2.62* 05/10/2016   CREATININE 2.87* 05/09/2016   CREATININE 3.02* 05/08/2016   CREATININE 1.17 06/15/2014   CREATININE 0.74 10/09/2013   CREATININE 0.67 10/08/2013      Last Labs  Recent Labs Lab 05/08/16 1300 05/09/16 0413 05/10/16 0353  K 3.6 3.4* 4.9       A:  AKI (Hx: CKD baseline creatinine 1.10-1.50) Hyperkalemia P:   Replace electrolytes as indicated, Phos, K, Mg epeleted Monitor urinary output, improved 5/24, continue IVF  GASTROINTESTINAL A:  Hx: GERD, Elevated liver enzymes (Hep B/C neg 2004) Vomiting Lipase neg P:  PPI for SUP Keep NPO for now Vomited 5/25, check kub neg, LWIS to ogt ogt OUT per self Antiemetics  HEMATOLOGIC A:  No problems identified P:  Heparin/SCD for VTE prophylaxis  Monitor for s/s of bleeding Transfuse for HgB <7 Trend CBC's   INFECTIOUS A:  UTI P:  Continue abx as listed above,dc vanco 5/25 Monitor cultures Trend WBC's and monitor fever  curve Trend Procalcitonin's . 69->2.10 LOW THRESHOLD TO DC ALL ABX 5/26  ENDOCRINE CBG (last 3)   Recent Labs (last 2 labs)      Recent Labs  05/09/16 2348 05/10/16 0408 05/10/16 0859  GLUCAP 156* 190* 100*       A:  DKA resolved P:  Transition to ICU glucose protocol. AG closed Stopped lantus 5/25 after vomiting and hypoglycemic event   NEUROLOGIC A:  Acute encephalopathy Hx: Migraines, Bipolar Disorder, ETOH abuse, Benign paroxysmal positional vertigo P:  RASS goal: 0 t Monitor s/sx of ETOH withdrawal Currently awake and alert  Resume outpatient ativan, Prn fentanyl , precedex and versed to maintain RASS goal Thiamine/folate/MVI Daily wakeup assessment   FAMILY  - Updates: no family at bedside Global: Now extubated , we will transfer to floor and to triad service. Renal will continue to follow. Add antiemetics and continue IVF. Off insulin for now. Neuro much improved.  Brett Canales Minor ACNP [facility] PCCM Pager [phone] till 3 pm If no answer page [phone]     Attending Note:  70 year old female with extensive PMH presenting with DKA, ARF and respiratory failure. Gap is closed, off insulin drip, Lantus started and TF. Off pressors and Cr plateaued and UOP is improving. Renal service following and appreciate input, no dialysis for now. Extubated 5/25 and seems to be doing well. I reviewed CXR myself, low lung volumes. Discussed with PCCM-NP and bedside RN. Will transfer to tele and to [facility] service. PCCM will sign off 5/27.  Acute respiratory failure: - Titrate O2 for sat of 88-92%. - Will need an ambulatory desaturation study prior to discharge for ? Of home O2.  Low lung volumes - atelectasis. - IS per RT protocol. - PT evaluation.  Dysphagia: - SLP ordered. - Diet per speech pathology.  Diabetes - presents with DKA - Diabetic coordinator  consult. - Lantus.  AKI: - KVO IVF. - Replace electrolytes as indicated. - Renal consult appreciated. - BMET in AM.  UTI: - Zosyn. - Once culture are finalized will need to deescalate abx.  Psych: concern for competency. - Psych consult called.  Transfer to tele and to [facility] service with PCCM off 5/27.  Patient seen and examined, agree with above note. I dictated the care and orders written for this patient under my direction.  Alyson Reedy, MD [phone]

## 2016-05-10 NOTE — Progress Notes (Signed)
Inpatient Diabetes Program Recommendations  AACE/ADA: New Consensus Statement on Inpatient Glycemic Control (2015)  Target Ranges:  Prepandial:   less than 140 mg/dL      Peak postprandial:   less than 180 mg/dL (1-2 hours)      Critically ill patients:  140 - 180 mg/dL   Review of Glycemic Control  Diabetes history: DM1 Outpatient Diabetes medications: Lantus 10 units QAM, Humalog 3-10 units tidwc Current orders for Inpatient glycemic control: Novolog moderate Q4H  [facility], [name] (MRN [id number]) as of 05/10/2016 15:07  Ref. Range 05/09/2016 19:44 05/09/2016 23:48 05/10/2016 04:08 05/10/2016 08:59 05/10/2016 11:33  Glucose-Capillary Latest Ref Range: 65-99 mg/dL 409110 (H) 811156 (H) 914190 (H) 100 (H) 104 (H)  Results for [facility], Cass[name] (MRN [id number]) as of 05/10/2016 15:07  Ref. Range 05/08/2016 13:00 05/09/2016 04:13 05/10/2016 03:53  Glucose Latest Ref Range: 65-99 mg/dL 086158 (H) 48 (L) 578246 (H)   Hypoglycemia in am on 5/25 with Lantus 10 units. Lantus 10 units was d/ced.   Inpatient Diabetes Program Recommendations:    Lantus 5 units QAM Novolog 3 units tidwc for meal coverage insulin when diet is advanced. HgbA1C to assess glycemic control prior to discharge.  Will continue to follow. Thank you. Ailene Ardshonda Eugenie Harewood, RD, LDN, CDE Inpatient Diabetes Coordinator [phone]

## 2016-05-10 NOTE — Progress Notes (Signed)
eLink Physician-Brief Progress Note Patient Name: Cassandra AbbeBillie A [name] DOB: 03-08-1946 MRN: [id number]   Date of Service  05/10/2016  HPI/Events of Note  Patient with nausea and mild abd pain.  H/O of pancreatitis.  Also with some concern about ETOH associated withdrawal symptoms.  eICU Interventions  Plan: Check amylase and lipase Change ativan to 0.5 mg IV q6 hours prn     Intervention Category Minor Interventions: Clinical assessment - ordering diagnostic tests;Routine modifications to care plan (e.g. PRN medications for pain, fever)  Jarron Curley 05/10/2016, 2:08 AM

## 2016-05-10 NOTE — Progress Notes (Signed)
Patient ID: Cassandra [name], female   DOB: 1946/02/13, 70 y.o.   MRN: [id number]  [facility] Progress Note   Assessment/ Plan:   1. Acute renal failure on chronic kidney disease stage III(Baseline creatinine 1.1-1.5): Oliguric and with evidence now pointing towards ATN likely mediated by DKA/sepsis-without any uremic signs or acute needs for intervention with hemodialysis at this point. Urine output improving and renal function continues to show sluggish improvement. Without any acute dialysis needs at this time-continue to follow closely with labs/physical exam while monitoring blood pressure and limiting hypotension/potential nephrotoxins exposure. Redose furosemide. 2. Diabetic ketoacidosis: Glycemic management per critical care with long and short-acting insulin.. 3. Septic shock: The exact source/etiology at this time remains unclear, she is on empiric antibiotic therapy with Zosyn-currently off pressors 4. Anemia: Likely chronic from chronic alcohol use/malnutrition. Getting intravenous iron 5. Chronic alcohol abuse: Sequela of chronic alcoholism-multiple electrolyte abnormalities and malnutrition. On withdrawal watch/protocol.  Subjective:   Self extubated herself overnight and evaluated for acute pancreatitis given her complaints of nausea and abdominal pain-amylase and lipase within normal limits . She currently denies any abdominal pain.    Objective:   BP 156/70 mmHg  Pulse 84  Temp(Src) 99.1 F (37.3 C) (Oral)  Resp 19  Ht _0  (1.651 m)  Wt 60 kg (132 lb 4.4 oz)  BMI 22.01 kg/m2  SpO2 97%  Intake/Output Summary (Last 24 hours) at 05/10/16 0847 Last data filed at 05/10/16 0830  Gross per 24 hour  Intake 2403.75 ml  Output   2755 ml  Net -351.25 ml   Weight change: 0.2 kg (7.1 oz)  Physical Exam: NGE:[id number], appears comfortable CVS: Regular rhythm, normal rate Resp: Coarse breath sounds-no rales/rhonchi Abd: Soft, slightly distended, nontender Ext:  No lower extremity edema  Imaging: Dg Chest Port 1 View  05/10/2016  CLINICAL DATA:  Respiratory failure. EXAM: PORTABLE CHEST 1 VIEW COMPARISON:  05/09/2016. FINDINGS: Interim removal of endotracheal tube and NG tube. Right IJ line in stable position. Heart size normal. Low lung volumes with mild bibasilar atelectasis and/or infiltrate. Small left pleural effusion. Prior cervical spine fusion. IMPRESSION: 1. Interim removal endotracheal tube and NG tube. Right IJ line stable position. 2. Low lung volumes with mild bibasilar atelectasis and/or infiltrates again noted. Electronically Signed   By: Grover Hill   On: 05/10/2016 07:42   Dg Chest Port 1 View  05/09/2016  CLINICAL DATA:  Respiratory failure. EXAM: PORTABLE CHEST 1 VIEW COMPARISON:  05/08/2016.  05/07/2016 . FINDINGS: Endotracheal tube, NG tube, right IJ line stable position. Heart size stable. Mild bibasilar atelectasis and/or infiltrates again noted. No prominent pleural effusion. No pneumothorax. Prior cervical spine fusion IMPRESSION: 1. Lines and tubes in stable position. 2. Mild bibasilar atelectasis and/or infiltrates again noted. No interim change. Electronically Signed   By: Marcello Moores  Register   On: 05/09/2016 07:47   Dg Abd Portable 1v  05/09/2016  CLINICAL DATA:  VOMITING EXAM: PORTABLE ABDOMEN - 1 VIEW COMPARISON:  11/19/2011 FINDINGS: Nasogastric tube is in place, tip overlying the level of the distal stomach or proximal duodenum. Surgical clips are identified in the right upper quadrant of the abdomen. Nonobstructive bowel gas pattern. Visualized osseous structures have a normal appearance. IMPRESSION: Nasogastric tube tip overlying the distal stomach or proximal duodenum. Electronically Signed   By: Nolon Nations M.D.   On: 05/09/2016 13:11    Labs: BMET  Recent Labs Lab 05/06/16 1514  05/07/16 0500 05/07/16 1100 05/07/16 1755 05/08/16 0400 05/08/16 1300  05/09/16 0413 05/10/16 0353  NA  --   < > 146* 144 142  142 140 140 137  K  --   < > 2.7* 4.1 3.3* 3.6 3.6 3.4* 4.9  CL  --   < > 109 110 109 108 110 111 105  CO2  --   < > _0 GLUCOSE  --   < > 122* 103* 117* 131* 158* 48* 246*  BUN  --   < > 36* 33* 33* 32* 30* 29* 24*  CREATININE  --   < > 4.78* 3.08* 3.14* 2.97* 3.02* 2.87* 2.62*  CALCIUM  --   < > 7.1* 6.7* 6.4* 6.8* 6.5* 6.5* 6.7*  PHOS 10.4*  --  <1.0*  --   --  3.5  --  3.5 5.6*  < > = values in this interval not displayed. CBC  Recent Labs Lab 05/06/16 1113  05/07/16 0500 05/08/16 0400 05/09/16 0413 05/10/16 0353  WBC 22.8*  < > 19.2* 16.9* 10.4 10.9*  NEUTROABS 18.6*  --   --   --   --   --   HGB 10.6*  < > 9.5* 9.5* 8.9* 8.8*  HCT 34.8*  < > 27.6* 27.4* 26.2* 27.0*  MCV 104.8*  < > 93.2 91.3 93.6 98.5  PLT 252  < > 211 187 146* 152  < > = values in this interval not displayed. Medications:    . antiseptic oral rinse  7 mL Mouth Rinse QID  . chlorhexidine gluconate (SAGE KIT)  15 mL Mouth Rinse BID  . feeding supplement (GLUCERNA 1.2 CAL)  1,000 mL Per Tube Q24H  . folic acid  1 mg Intravenous Daily  . heparin  5,000 Units Subcutaneous Q8H  . insulin aspart  0-15 Units Subcutaneous Q4H  . levothyroxine  100 mcg Per Tube QAC breakfast  . metolazone  10 mg Oral Once  . multivitamin  15 mL Per Tube Daily  . pantoprazole (PROTONIX) IV  40 mg Intravenous Q12H  . piperacillin-tazobactam (ZOSYN)  IV  2.25 g Intravenous Q8H  . sodium chloride  500 mL Intravenous Once  . thiamine IV  100 mg Intravenous Daily   Elmarie Shiley, MD 05/10/2016, 8:47 AM

## 2016-05-10 NOTE — Consult Note (Signed)
[facility] Consult   Reason for Consult:  Capacity evaluation Referring Physician:  Dr. Nelda Marseille Patient Identification: Cassandra [name] MRN:  [id number] Principal Diagnosis: Generalized anxiety disorder Diagnosis:   Patient Active Problem List   Diagnosis Date Noted  . DKA (diabetic ketoacidoses) ([place]) [E13.10] 05/06/2016  . Acute respiratory failure ([place]) [J96.00] 05/06/2016  . Acute respiratory failure with hypoxia ([place]) [J96.01] 05/06/2016  . Acute encephalopathy [G93.40] 05/06/2016  . Septic shock ([place]) [A41.9, R65.21]   . Acute respiratory failure with hypoxia and hypercapnia (HCC) [J96.01, J96.02]   . 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 Depression [F32.9]   . Hypertension [I10]   . CKD (chronic kidney disease) stage 3, GFR 30-59 ml/min [N18.3]   . Hypothyroidism [E03.9]   . Vitamin B12 deficiency [E53.8]   . Benign paroxysmal positional vertigo [H81.10]   . Anxiety [F41.9]   . Allergic rhinitis [J30.9]   . Glaucoma [H40.9]   . Benign hypertension with CKD (chronic kidney disease) stage III [I12.9, N18.3]   . Tobacco use [Z72.0]   . Cervicalgia [M54.2]   . Elevated liver enzymes [R74.8]   . History of alcohol use [Z65.8]     Total Time spent with patient: 1 hour  Subjective:   Cassandra [name] is a 70 y.o. female patient admitted with AMS and confusion.  HPI: Cassandra [name] is a 70 y.o. female, seen, chart reviewed for the face-to-face psychiatric consultation and evaluation of increased confusion and altered mental status on admission. Patient appeared awake, alert and oriented to first name, last name and being in hospital. Patient is also understand that he has been suffering with diabetes mellitus and complications of diabetes. Patient reportedly living with her husband and has a 2 grownup daughters. Patient complaining about excessive anxiety and her current medication Zoloft which started 2 months ago is not  controlling her symptoms. Patient is also has increased symptoms of gastric upset. Patient seems to be slowly improving her altered mental status since admitted to the hospital. Patient blood sugars are coming down and she also known for alcohol user. 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  PAST SURGICAL HISTORY: history that includes Appendectomy; Tonsillectomy; Cholecystectomy; Hernia repair; Cataract extraction; Abdominal hysterectomy; Bladder surgery; and Cervical disc surgery.  Past Psychiatric History: Patient denied past history of psychiatric illness.  Risk to Self:   Risk to Others:   Prior Inpatient Therapy:   Prior Outpatient Therapy:    Past Medical History:  Past Medical History  Diagnosis Date  . Diabetes mellitus without complication ([place])   . Thyroid disease   . 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 Depression   . Hypertension   . CKD (chronic kidney disease) stage 3, GFR 30-59 ml/min   . Hypothyroidism   . Vitamin B12 deficiency   . Benign paroxysmal positional vertigo   . Broken shoulder   . Broken toes   . Broken finger   .  Migraines   . GERD (gastroesophageal reflux disease)   . Anxiety   . Interstitial cystitis     bladder stretched every 9 months  . Allergic rhinitis    . Glaucoma   . Benign hypertension with CKD (chronic kidney disease) stage III   . Tobacco use   . Cervicalgia   . Elevated liver enzymes Hep B/C neg 2014  . Bipolar disorder ([place])   . History of alcohol use   . Psoriasis     Past Surgical History  Procedure Laterality Date  . Appendectomy    . Tonsillectomy    . Cholecystectomy    . Hernia repair    . Cataract extraction    . Abdominal hysterectomy      with oophorectomy  . Bladder surgery    . Cervical disc surgery     Family History:  Family History  Problem Relation Age of Onset  . Alcohol abuse Mother   . Arthritis Mother   . Asthma Mother   . Cancer Mother     colon cancer  . Hypertension Mother   . Migraines Mother   . Stroke Mother   . Lung disease Mother   . COPD Mother   . Diabetes Father   . Hypertension Father   . Heart disease Father   . Heart attack Father   . Heart disease Paternal Grandmother   . Diabetes Paternal Grandmother   . Stroke Paternal Grandmother   . Cancer Paternal Grandmother   . Diabetes Paternal Grandfather    Family Psychiatric  History: No known family history of mental illness. Social History:  History  Alcohol Use  . 4.2 oz/week  . 7 Glasses of wine per week    Comment: at least one glass of wine a night     History  Drug Use No    Social History   Social History  . Marital Status: Married    Spouse Name: N/A  . Number of Children: N/A  . Years of Education: N/A   Social History Main Topics  . Smoking status: Current Every Day Smoker -- 0.25 packs/day for 10 years    Types: Cigarettes  . Smokeless tobacco: Never Used  . Alcohol Use: 4.2 oz/week    7 Glasses of wine per week     Comment: at least one glass of wine a night  . Drug Use: No  . Sexual Activity: Not Asked   Other Topics Concern  . None   Social History Narrative   Patient has been married for 13 years and lives with spouse, has 2 children, 3 grandchildren, and 1 great grandchild. She does not  use walker/cane, does not clean house, does not shop, does not do yard work, and does not drive. She is retired. Previous occupation was Product manager. Completed [employer].         Epwoth Sleepiness Scale Score:  7      --I have high blood pressure   --I have had insomnia   --I seem to be losing my sex drive   --I feel stressed and lack motivation   --I wake up to urinate frequently at night   --I wake up with a dry mouth or sore throat   --I feel excessively sleepy and tired throughout the day   --I have Diabetes   --I have been told that I snore   --I have problems with memory/concentration   --I frequently awake with headaches  Additional Social History:    Allergies:   Allergies  Allergen Reactions  . Ciprofloxacin Other (See Comments)    No reaction specified  . Codeine Diarrhea and Nausea And Vomiting  . Doxycycline Diarrhea and Nausea And Vomiting  . Hydrocodone   . Omnicef [Cefdinir] Nausea Only and Other (See Comments)    constipation    Labs:  Results for orders placed or performed during the hospital encounter of 05/06/16 (from the past 48 hour(s))  Basic metabolic panel     Status: Abnormal   Collection Time: 05/08/16  1:00 PM  Result Value Ref Range   Sodium 140 135 - 145 mmol/L   Potassium 3.6 3.5 - 5.1 mmol/L   Chloride 110 101 - 111 mmol/L   CO2 23 22 - 32 mmol/L   Glucose, Bld 158 (H) 65 - 99 mg/dL   BUN 30 (H) 6 - 20 mg/dL   Creatinine, Ser 3.02 (H) 0.44 - 1.00 mg/dL   Calcium 6.5 (L) 8.9 - 10.3 mg/dL   GFR calc non Af Amer 15 (L) >60 mL/min   GFR calc Af Amer 17 (L) >60 mL/min    Comment: (NOTE) The eGFR has been calculated using the CKD EPI equation. This calculation has not been validated in all clinical situations. eGFR's persistently <60 mL/min signify possible Chronic Kidney Disease.    Anion gap 7 5 - 15  Glucose, capillary     Status: Abnormal   Collection Time: 05/08/16  4:04 PM  Result Value Ref Range    Glucose-Capillary 162 (H) 65 - 99 mg/dL  Glucose, capillary     Status: Abnormal   Collection Time: 05/08/16  7:18 PM  Result Value Ref Range   Glucose-Capillary 129 (H) 65 - 99 mg/dL   Comment 1 Capillary Specimen    Comment 2 Notify RN    Comment 3 Document in Chart   Glucose, capillary     Status: Abnormal   Collection Time: 05/08/16 11:58 PM  Result Value Ref Range   Glucose-Capillary 176 (H) 65 - 99 mg/dL   Comment 1 Capillary Specimen    Comment 2 Notify RN    Comment 3 Document in Chart   Glucose, capillary     Status: Abnormal   Collection Time: 05/09/16  4:10 AM  Result Value Ref Range   Glucose-Capillary 45 (L) 65 - 99 mg/dL   Comment 1 Capillary Specimen    Comment 2 Notify RN    Comment 3 Document in Chart   Glucose, capillary     Status: Abnormal   Collection Time: 05/09/16  4:12 AM  Result Value Ref Range   Glucose-Capillary 40 (LL) 65 - 99 mg/dL   Comment 1 Capillary Specimen    Comment 2 Notify RN    Comment 3 Document in Chart   CBC     Status: Abnormal   Collection Time: 05/09/16  4:13 AM  Result Value Ref Range   WBC 10.4 4.0 - 10.5 K/uL   RBC 2.80 (L) 3.87 - 5.11 MIL/uL   Hemoglobin 8.9 (L) 12.0 - 15.0 g/dL   HCT 26.2 (L) 36.0 - 46.0 %   MCV 93.6 78.0 - 100.0 fL   MCH 31.8 26.0 - 34.0 pg   MCHC 34.0 30.0 - 36.0 g/dL   RDW 14.0 11.5 - 15.5 %   Platelets 146 (L) 150 - 400 K/uL  Basic metabolic panel     Status: Abnormal   Collection Time: 05/09/16  4:13 AM  Result Value Ref  Range   Sodium 140 135 - 145 mmol/L   Potassium 3.4 (L) 3.5 - 5.1 mmol/L   Chloride 111 101 - 111 mmol/L   CO2 22 22 - 32 mmol/L   Glucose, Bld 48 (L) 65 - 99 mg/dL   BUN 29 (H) 6 - 20 mg/dL   Creatinine, Ser 5.85 (H) 0.44 - 1.00 mg/dL   Calcium 6.5 (L) 8.9 - 10.3 mg/dL   GFR calc non Af Amer 16 (L) >60 mL/min   GFR calc Af Amer 18 (L) >60 mL/min    Comment: (NOTE) The eGFR has been calculated using the CKD EPI equation. This calculation has not been validated in all  clinical situations. eGFR's persistently <60 mL/min signify possible Chronic Kidney Disease.    Anion gap 7 5 - 15  Phosphorus     Status: None   Collection Time: 05/09/16  4:13 AM  Result Value Ref Range   Phosphorus 3.5 2.5 - 4.6 mg/dL  Magnesium     Status: None   Collection Time: 05/09/16  4:13 AM  Result Value Ref Range   Magnesium 2.4 1.7 - 2.4 mg/dL  I-STAT 3, arterial blood gas (G3+)     Status: Abnormal   Collection Time: 05/09/16  4:37 AM  Result Value Ref Range   pH, Arterial 7.274 (L) 7.350 - 7.450   pCO2 arterial 45.5 (H) 35.0 - 45.0 mmHg   pO2, Arterial 104.0 (H) 80.0 - 100.0 mmHg   Bicarbonate 21.0 20.0 - 24.0 mEq/L   TCO2 22 0 - 100 mmol/L   O2 Saturation 97.0 %   Acid-base deficit 6.0 (H) 0.0 - 2.0 mmol/L   Patient temperature 99.2 F    Collection site ARTERIAL LINE    Drawn by Nurse    Sample type ARTERIAL   Glucose, capillary     Status: Abnormal   Collection Time: 05/09/16  4:39 AM  Result Value Ref Range   Glucose-Capillary 137 (H) 65 - 99 mg/dL  Glucose, capillary     Status: Abnormal   Collection Time: 05/09/16  8:15 AM  Result Value Ref Range   Glucose-Capillary 30 (LL) 65 - 99 mg/dL   Comment 1 Capillary Specimen    Comment 2 Notify RN   Glucose, capillary     Status: None   Collection Time: 05/09/16  9:24 AM  Result Value Ref Range   Glucose-Capillary 94 65 - 99 mg/dL   Comment 1 Arterial Specimen   Glucose, capillary     Status: Abnormal   Collection Time: 05/09/16 12:33 PM  Result Value Ref Range   Glucose-Capillary 56 (L) 65 - 99 mg/dL   Comment 1 Capillary Specimen    Comment 2 Notify RN   Glucose, capillary     Status: Abnormal   Collection Time: 05/09/16  2:53 PM  Result Value Ref Range   Glucose-Capillary 130 (H) 65 - 99 mg/dL   Comment 1 Capillary Specimen   Glucose, capillary     Status: Abnormal   Collection Time: 05/09/16  3:44 PM  Result Value Ref Range   Glucose-Capillary 132 (H) 65 - 99 mg/dL   Comment 1 Capillary  Specimen    Comment 2 Notify RN   Glucose, capillary     Status: Abnormal   Collection Time: 05/09/16  7:44 PM  Result Value Ref Range   Glucose-Capillary 110 (H) 65 - 99 mg/dL   Comment 1 Capillary Specimen    Comment 2 Notify RN    Comment 3 Document in  Chart   Glucose, capillary     Status: Abnormal   Collection Time: 05/09/16 11:48 PM  Result Value Ref Range   Glucose-Capillary 156 (H) 65 - 99 mg/dL   Comment 1 Capillary Specimen    Comment 2 Notify RN    Comment 3 Document in Chart   CBC     Status: Abnormal   Collection Time: 05/10/16  3:53 AM  Result Value Ref Range   WBC 10.9 (H) 4.0 - 10.5 K/uL   RBC 2.74 (L) 3.87 - 5.11 MIL/uL   Hemoglobin 8.8 (L) 12.0 - 15.0 g/dL   HCT 27.0 (L) 36.0 - 46.0 %   MCV 98.5 78.0 - 100.0 fL   MCH 32.1 26.0 - 34.0 pg   MCHC 32.6 30.0 - 36.0 g/dL   RDW 14.3 11.5 - 15.5 %   Platelets 152 150 - 400 K/uL  Basic metabolic panel     Status: Abnormal   Collection Time: 05/10/16  3:53 AM  Result Value Ref Range   Sodium 137 135 - 145 mmol/L   Potassium 4.9 3.5 - 5.1 mmol/L    Comment: DELTA CHECK NOTED   Chloride 105 101 - 111 mmol/L   CO2 22 22 - 32 mmol/L   Glucose, Bld 246 (H) 65 - 99 mg/dL   BUN 24 (H) 6 - 20 mg/dL   Creatinine, Ser 2.62 (H) 0.44 - 1.00 mg/dL   Calcium 6.7 (L) 8.9 - 10.3 mg/dL   GFR calc non Af Amer 18 (L) >60 mL/min   GFR calc Af Amer 20 (L) >60 mL/min    Comment: (NOTE) The eGFR has been calculated using the CKD EPI equation. This calculation has not been validated in all clinical situations. eGFR's persistently <60 mL/min signify possible Chronic Kidney Disease.    Anion gap 10 5 - 15  Phosphorus     Status: Abnormal   Collection Time: 05/10/16  3:53 AM  Result Value Ref Range   Phosphorus 5.6 (H) 2.5 - 4.6 mg/dL  Magnesium     Status: None   Collection Time: 05/10/16  3:53 AM  Result Value Ref Range   Magnesium 2.0 1.7 - 2.4 mg/dL  Amylase     Status: None   Collection Time: 05/10/16  3:59 AM  Result  Value Ref Range   Amylase 55 28 - 100 U/L  Lipase, blood     Status: None   Collection Time: 05/10/16  3:59 AM  Result Value Ref Range   Lipase 21 11 - 51 U/L  Glucose, capillary     Status: Abnormal   Collection Time: 05/10/16  4:08 AM  Result Value Ref Range   Glucose-Capillary 190 (H) 65 - 99 mg/dL   Comment 1 Capillary Specimen    Comment 2 Notify RN    Comment 3 Document in Chart   Glucose, capillary     Status: Abnormal   Collection Time: 05/10/16  8:59 AM  Result Value Ref Range   Glucose-Capillary 100 (H) 65 - 99 mg/dL   Comment 1 Capillary Specimen    Comment 2 Notify RN   Glucose, capillary     Status: Abnormal   Collection Time: 05/10/16 11:33 AM  Result Value Ref Range   Glucose-Capillary 104 (H) 65 - 99 mg/dL    Current Facility-Administered Medications  Medication Dose Route Frequency Provider Last Rate Last Dose  . antiseptic oral rinse solution (CORINZ)  7 mL Mouth Rinse QID Rush Farmer, MD   7 mL at  05/10/16 1156  . chlorhexidine gluconate (SAGE KIT) (PERIDEX) 0.12 % solution 15 mL  15 mL Mouth Rinse BID Rush Farmer, MD   15 mL at 05/10/16 0800  . dextrose 5 %-0.45 % sodium chloride infusion   Intravenous Continuous Rush Farmer, MD 75 mL/hr at 05/10/16 1200    . folic acid injection 1 mg  1 mg Intravenous Daily Awilda Bill, NP   1 mg at 05/10/16 7654  . furosemide (LASIX) injection 40 mg  40 mg Intravenous Q6H Elmarie Shiley, MD   40 mg at 05/10/16 0922  . heparin injection 5,000 Units  5,000 Units Subcutaneous Q8H Awilda Bill, NP   5,000 Units at 05/10/16 [phone]  . insulin aspart (novoLOG) injection 0-15 Units  0-15 Units Subcutaneous Q4H Grace Bushy Minor, NP   3 Units at 05/10/16 0400  . levothyroxine (SYNTHROID, LEVOTHROID) injection 50 mcg  50 mcg Intravenous Daily Grace Bushy Minor, NP   50 mcg at 05/10/16 1139  . LORazepam (ATIVAN) injection 0.5 mg  0.5 mg Intravenous Q6H PRN Colbert Coyer, MD      . metoCLOPramide (REGLAN) injection 5 mg  5  mg Intravenous Q12H William S Minor, NP   5 mg at 05/10/16 1139  . metolazone (ZAROXOLYN) tablet 10 mg  10 mg Oral Once Rush Farmer, MD   10 mg at 05/09/16 1430  . ondansetron (ZOFRAN) injection 4 mg  4 mg Intravenous Q8H PRN Rigoberto Noel, MD   4 mg at 05/10/16 0932  . pantoprazole (PROTONIX) injection 40 mg  40 mg Intravenous Q12H Awilda Bill, NP   40 mg at 05/10/16 5465  . piperacillin-tazobactam (ZOSYN) IVPB 2.25 g  2.25 g Intravenous Q8H Romona Curls, RPH   2.25 g at 05/10/16 0830  . sertraline (ZOLOFT) tablet 25 mg  25 mg Oral Daily Rush Farmer, MD      . thiamine (B-1) injection 100 mg  100 mg Intravenous Daily Awilda Bill, NP   100 mg at 05/10/16 6812    Musculoskeletal: Strength & Muscle Tone: decreased Gait & Station: unable to stand Patient leans: N/A  Psychiatric Specialty Exam: Physical Exam as per history and physical   ROS generalized weakness, fatigue, and severe anxiety No Fever-chills, No Headache, No changes with Vision or hearing, reports vertigo No problems swallowing food or Liquids, No Chest pain, Cough or Shortness of Breath, No Abdominal pain, No Nausea or Vommitting, Bowel movements are regular, No Blood in stool or Urine, No dysuria, No new skin rashes or bruises, No new joints pains-aches,  No new weakness, tingling, numbness in any extremity, No recent weight gain or loss, No polyuria, polydypsia or polyphagia,   A full 10 point Review of Systems was done, except as stated above, all other Review of Systems were negative.   Blood pressure 163/68, pulse 95, temperature 99 F (37.2 C), temperature source Oral, resp. rate 26, height '5\' 5"'$  (1.651 m), weight 60 kg (132 lb 4.4 oz), SpO2 95 %.Body mass index is 22.01 kg/(m^2).  General Appearance: Guarded  Eye Contact:  Good  Speech:  Clear and Coherent  Volume:  Decreased  Mood:  Anxious and Depressed  Affect:  Appropriate, Congruent and Depressed  Thought Process:  Coherent and Goal  Directed  Orientation:  Full (Time, Place, and Person)  Thought Content:  WDL  Suicidal Thoughts:  No  Homicidal Thoughts:  No  Memory:  Immediate;   Good Recent;   Fair  Judgement:  Fair  Insight:  Fair  Psychomotor Activity:  Restlessness  Concentration:  Concentration: Good  Recall:  Good  Fund of Knowledge:  Good  Language:  Good  Akathisia:  Negative  Handed:  Right  AIMS (if indicated):     Assets:  Communication Skills Desire for Improvement Financial Resources/Insurance Housing Intimacy Leisure Time Resilience Social Support Talents/Skills Transportation  ADL's:  Impaired  Cognition:  WNL  Sleep:        Treatment Plan Summary: Patient has been depressed and anxious about not able to control her medical problems especially diabetes, diabetic ketoacidosis, and complication of diabetes.  Based on my evaluation today patient meets criteria for capacity to make her own medical decisions and living arrangements.  Patient did not respond to her antidepressant medication Zoloft and also has GI upset  We will discontinue Zoloft which is not helpful  Start Lexapro 10 mg daily at bedtime and also will introduce was put on 7.5 mg 2 times daily for generalized anxiety  Appreciate psychiatric consultation and follow up as clinically required Please contact [phone] or [phone] if needs further assistance  Disposition: Patient will be referred to the outpatient medication management when medically stable. Patient does not meet criteria for psychiatric inpatient admission. Supportive therapy provided about ongoing stressors.  Ambrose Finland, MD 05/10/2016 12:28 PM

## 2016-05-10 NOTE — Progress Notes (Signed)
PULMONARY / CRITICAL CARE MEDICINE   Name: Cassandra [name] MRN: [id number] DOB: Feb 05, 1946    ADMISSION DATE:  05/06/2016 CONSULTATION DATE:  05/06/2016  REFERRING MD:  Dr. Criss Alvine  CHIEF COMPLAINT:  DKA   HISTORY OF PRESENT ILLNESS:   This is a 70 y.o. female with PMH of Type 1 Diabetes mellitus, Hypothyroidism, Hypertension, CKD stage 3 (GFR 30-59 ml/min), Tobacco use, GERD, elevated liver enzymes (Hep B/C neg 2014).  She presents to the ER 05/06/2016 with altered mental status onset last night. On a good day pt is normally ambulatory and performs ADL's independently. Pt has been complaining of frequent migraines and vertigo for about the last year she is followed by ENT. Husband endorses a similar episode of confusion about 8 years ago, which turned out to be caused by a blood sugar of over 1200. Patient is also a chronic alcohol user, husband states pt drinks everyday throughout the day for the past 10-15 years, last alcoholic beverage per husband was 05/05/2016.  According to pts. husband pt began acting different around 5 or 6 pm 05/05/2016 with slight confusion.  Pt tossed and mumbled throughout the night. On 05/06/2016 she had persistent confusion and the husband was unsure as to what he should do. Husband then received a call from the patient's ENT physicians office notifying him that the patient missed an appointment this morning. When he told them what was going on, they advised him to call 911. Upon EMS arrival CBG reading "High," pts. last dose of insulin was Sunday morning 05/05/2016.  PCCM consulted 05/06/2016 for management of acute hypoxic respiratory failure, acute encephalopathy and DKA   SUBJECTIVE:  Vomited. Hypoglycemic, agitated at times   VITAL SIGNS: BP 155/79 mmHg  Pulse 92  Temp(Src) 98 F (36.7 C) (Oral)  Resp 15  Ht 5\' 5"  (1.651 m)  Wt 132 lb 4.4 oz (60 kg)  BMI 22.01 kg/m2  SpO2 99%  HEMODYNAMICS: CVP:  [9 mmHg-16 mmHg] 13 mmHg  VENTILATOR SETTINGS: Vent  Mode:  [-] PRVC FiO2 (%):  [40 %] 40 % Set Rate:  [14 bmp] 14 bmp Vt Set:  [480 mL] 480 mL PEEP:  [5 cmH20] 5 cmH20 Plateau Pressure:  [26 cmH20] 26 cmH20  INTAKE / OUTPUT: I/O last 3 completed shifts: In: 3531.4 [I.V.:2841.4; NG/GT:90; IV Piggyback:600] Out: 2645 [Urine:2345; Emesis/NG output:300]  PHYSICAL EXAMINATION: General:  Awake and alert. CO abd pain Neuro: Intact HEENT:  Supple, no JVD Cardiovascular:  SR, s1s2, no murmur Lungs:  diminished in bases Abdomen: +BS x4,  distended,  Tender. Co abd pain KUB non obstructive gas pattern Musculoskeletal:  Moves all extremities, normal bulk Skin:  intact  LABS:  BMET  Recent Labs Lab 05/08/16 1300 05/09/16 0413 05/10/16 0353  NA 140 140 137  K 3.6 3.4* 4.9  CL 110 111 105  CO2 23 22 22   BUN 30* 29* 24*  CREATININE 3.02* 2.87* 2.62*  GLUCOSE 158* 48* 246*    Electrolytes  Recent Labs Lab 05/08/16 0400 05/08/16 1300 05/09/16 0413 05/10/16 0353  CALCIUM 6.8* 6.5* 6.5* 6.7*  MG 2.5*  --  2.4 2.0  PHOS 3.5  --  3.5 5.6*    CBC  Recent Labs Lab 05/08/16 0400 05/09/16 0413 05/10/16 0353  WBC 16.9* 10.4 10.9*  HGB 9.5* 8.9* 8.8*  HCT 27.4* 26.2* 27.0*  PLT 187 146* 152    Coag's  Recent Labs Lab 05/06/16 1113  INR 1.13    Sepsis Markers  Recent Labs Lab 05/06/16  1126 05/06/16 1513 05/06/16 1918 05/06/16 2235 05/07/16 0500 05/08/16 0400  LATICACIDVEN 1.63  --  2.3* 2.2*  --   --   PROCALCITON  --  0.69  --   --  2.10 1.63    ABG  Recent Labs Lab 05/07/16 0500 05/08/16 0403 05/09/16 0437  PHART 7.626* 7.429 7.274*  PCO2ART 21.7* 35.2 45.5*  PO2ART 169.0* 160* 104.0*    Liver Enzymes  Recent Labs Lab 05/06/16 1113 05/08/16 0400  AST 34  --   ALT 15  --   ALKPHOS 50  --   BILITOT 1.6*  --   ALBUMIN 3.3* 1.8*    Cardiac Enzymes No results for input(s): TROPONINI, PROBNP in the last 168 hours.  Glucose  Recent Labs Lab 05/09/16 1453 05/09/16 1544  05/09/16 1944 05/09/16 2348 05/10/16 0408 05/10/16 0859  GLUCAP 130* 132* 110* 156* 190* 100*    Imaging Dg Chest Port 1 View  05/10/2016  CLINICAL DATA:  Respiratory failure. EXAM: PORTABLE CHEST 1 VIEW COMPARISON:  05/09/2016. FINDINGS: Interim removal of endotracheal tube and NG tube. Right IJ line in stable position. Heart size normal. Low lung volumes with mild bibasilar atelectasis and/or infiltrate. Small left pleural effusion. Prior cervical spine fusion. IMPRESSION: 1. Interim removal endotracheal tube and NG tube. Right IJ line stable position. 2. Low lung volumes with mild bibasilar atelectasis and/or infiltrates again noted. Electronically Signed   By: Maisie Fus  Register   On: 05/10/2016 07:42   Dg Abd Portable 1v  05/09/2016  CLINICAL DATA:  VOMITING EXAM: PORTABLE ABDOMEN - 1 VIEW COMPARISON:  11/19/2011 FINDINGS: Nasogastric tube is in place, tip overlying the level of the distal stomach or proximal duodenum. Surgical clips are identified in the right upper quadrant of the abdomen. Nonobstructive bowel gas pattern. Visualized osseous structures have a normal appearance. IMPRESSION: Nasogastric tube tip overlying the distal stomach or proximal duodenum. Electronically Signed   By: Norva Pavlov M.D.   On: 05/09/2016 13:11     STUDIES:  Ct of Head 5/22>>negative Urine drug screen 5/22>>negative   CULTURES: Blood 5/22>> Urine 5/22>>insig growth   ANTIBIOTICS: Vancomycin 5/22>>5/25 Zosyn 5/22>  SIGNIFICANT EVENTS: 5/22>>Pt arrived via EMS with AMS and elevated blood glucose level 5/22>>Pt intubated in ED secondary to acute hypoxic respiratory distress 5/23 repletion of electrolytes   5/25 vomited>> 5/25 self extubation  LINES/TUBES: ETT>>5/22>>5/25 self extubation Right IJ CVC>>5/22 R radial a-line 5/22>>>5/25  DISCUSSION: This is a 70 y.o. female with PMH of Type 1 Diabetes mellitus, Hypothyroidism, Hypertension, CKD stage 3 (GFR 30-59 ml/min), Tobacco use,  GERD, elevated liver enzymes (Hep B/C neg 2014).  She presents to the ER 05/06/2016 with altered mental status. Hypotensive and in metabolic disarray. Monitor for ETOH withdrawal .  Resumed home ativan to help with agitation.  ASSESSMENT / PLAN:  PULMONARY A: Acute hypoxic respiratory failure  Underlying COPD Tobacco abuse P:   Self extubated 5/25 On room air  CARDIOVASCULAR A:  Hx: Hypertension with CKD-controlled P:  Serial troponin's-> Maintain map >65 Levophed for SBP support, off 5/25 Follow CVP, now 13  RENAL Lab Results  Component Value Date   CREATININE 2.62* 05/10/2016   CREATININE 2.87* 05/09/2016   CREATININE 3.02* 05/08/2016   CREATININE 1.17 06/15/2014   CREATININE 0.74 10/09/2013   CREATININE 0.67 10/08/2013    Recent Labs Lab 05/08/16 1300 05/09/16 0413 05/10/16 0353  K 3.6 3.4* 4.9     A:   AKI (Hx: CKD baseline creatinine 1.10-1.50) Hyperkalemia P:  Replace electrolytes as indicated, Phos, K, Mg epeleted Monitor urinary output, improved 5/24, continue IVF  GASTROINTESTINAL A:   Hx: GERD, Elevated liver enzymes (Hep B/C neg 2004) Vomiting Lipase neg P:   PPI for SUP Keep NPO for now Vomited 5/25, check kub neg, LWIS to ogt ogt OUT per self Antiemetics   HEMATOLOGIC A:   No problems identified P:  Heparin/SCD for VTE prophylaxis    Monitor for s/s of bleeding Transfuse for HgB <7 Trend CBC's   INFECTIOUS A:   UTI P:   Continue abx as listed above,dc vanco 5/25 Monitor cultures Trend WBC's and monitor fever curve Trend Procalcitonin's . 69->2.10 LOW THRESHOLD TO DC ALL ABX 5/26  ENDOCRINE CBG (last 3)   Recent Labs  05/09/16 2348 05/10/16 0408 05/10/16 0859  GLUCAP 156* 190* 100*     A:   DKA resolved P:   Transition to ICU glucose protocol. AG closed Stopped lantus 5/25 after vomiting and hypoglycemic event   NEUROLOGIC A:   Acute encephalopathy Hx: Migraines, Bipolar Disorder, ETOH abuse, Benign  paroxysmal positional vertigo P:   RASS goal: 0 t Monitor s/sx of ETOH withdrawal Currently awake and alert  Resume outpatient ativan, Prn fentanyl , precedex and versed to maintain RASS goal Thiamine/folate/MVI Daily wakeup assessment   FAMILY  - Updates: no family at bedside Global: Now extubated , we will transfer to floor and to triad service. Dr. Sharon SellerMcClung aware.Renal will continue to follow. Add antiemetics and continue IVF. Off insulin for now. Neuro much improved.  Brett CanalesSteve Minor ACNP [facility] Pager [phone] till 3 pm If no answer page [phone]  Alyson ReedyWesam G. Zaheer Wageman, M.D. [facility]. Pager: [phone]. After hours pager: [phone].

## 2016-05-10 NOTE — Progress Notes (Signed)
Pt transferred to 2W20 via bed with assistance of NT; pt on BR until 1315 s/p removal of R IJ central line; VSS upon transfer; daughter called to make aware of transfer; will cont. To monitor.  [name], [name]

## 2016-05-10 NOTE — Progress Notes (Signed)
eLink Physician-Brief Progress Note Patient Name: Cassandra AbbeBillie A [name] DOB: 25-Dec-1945 MRN: [id number]   Date of Service  05/10/2016  HPI/Events of Note  Pt requesting food C/O headache  eICU Interventions  Bedside swallow eval Start clears Tylenol PRN        Rosann Gorum 05/10/2016, 5:05 PM

## 2016-05-10 NOTE — Care Management Important Message (Signed)
Important Message  Patient Details  Name: Cassandra [name] MRN: [id number] Date of Birth: 27-Aug-1946   Medicare Important Message Given:  Yes    [name], Lendora Keys Abena 05/10/2016, 1:22 PM

## 2016-05-10 NOTE — Progress Notes (Signed)
Report given to BelviewKristin, RN on 2W; pt to transfer to 2W20; will cont. To monitor.  [name], Avyn Coate A

## 2016-05-11 LAB — CBC
HCT: 27.7 % — ABNORMAL LOW (ref 36.0–46.0)
Hemoglobin: 9.1 g/dL — ABNORMAL LOW (ref 12.0–15.0)
MCH: 31.5 pg (ref 26.0–34.0)
MCHC: 32.9 g/dL (ref 30.0–36.0)
MCV: 95.8 fL (ref 78.0–100.0)
PLATELETS: 199 10*3/uL (ref 150–400)
RBC: 2.89 MIL/uL — ABNORMAL LOW (ref 3.87–5.11)
RDW: 13.4 % (ref 11.5–15.5)
WBC: 13.5 10*3/uL — AB (ref 4.0–10.5)

## 2016-05-11 LAB — GLUCOSE, CAPILLARY
GLUCOSE-CAPILLARY: 146 mg/dL — AB (ref 65–99)
GLUCOSE-CAPILLARY: 155 mg/dL — AB (ref 65–99)
GLUCOSE-CAPILLARY: 196 mg/dL — AB (ref 65–99)
Glucose-Capillary: 110 mg/dL — ABNORMAL HIGH (ref 65–99)
Glucose-Capillary: 187 mg/dL — ABNORMAL HIGH (ref 65–99)
Glucose-Capillary: 95 mg/dL (ref 65–99)

## 2016-05-11 LAB — BASIC METABOLIC PANEL
ANION GAP: 12 (ref 5–15)
BUN: 22 mg/dL — ABNORMAL HIGH (ref 6–20)
CALCIUM: 7.4 mg/dL — AB (ref 8.9–10.3)
CO2: 21 mmol/L — ABNORMAL LOW (ref 22–32)
Chloride: 104 mmol/L (ref 101–111)
Creatinine, Ser: 2.42 mg/dL — ABNORMAL HIGH (ref 0.44–1.00)
GFR calc Af Amer: 23 mL/min — ABNORMAL LOW (ref >60)
GFR, EST NON AFRICAN AMERICAN: 19 mL/min — AB (ref >60)
GLUCOSE: 181 mg/dL — AB (ref 65–99)
Potassium: 3.1 mmol/L — ABNORMAL LOW (ref 3.5–5.1)
SODIUM: 137 mmol/L (ref 135–145)

## 2016-05-11 LAB — CULTURE, BLOOD (ROUTINE X 2)
Culture: NO GROWTH
Culture: NO GROWTH

## 2016-05-11 LAB — C DIFFICILE QUICK SCREEN W PCR REFLEX
C DIFFICILE (CDIFF) INTERP: NEGATIVE
C DIFFICILE (CDIFF) TOXIN: NEGATIVE
C DIFFICLE (CDIFF) ANTIGEN: NEGATIVE

## 2016-05-11 LAB — MAGNESIUM: MAGNESIUM: 1.7 mg/dL (ref 1.7–2.4)

## 2016-05-11 LAB — PHOSPHORUS: Phosphorus: 2.8 mg/dL (ref 2.5–4.6)

## 2016-05-11 MED ORDER — FOLIC ACID 1 MG PO TABS
1.0000 mg | ORAL_TABLET | Freq: Every day | ORAL | Status: DC
  Administered 2016-05-12 – 2016-05-19 (×8): 1 mg via ORAL
  Filled 2016-05-11 (×8): qty 1

## 2016-05-11 MED ORDER — VITAMIN B-1 100 MG PO TABS
100.0000 mg | ORAL_TABLET | Freq: Every day | ORAL | Status: DC
  Administered 2016-05-12 – 2016-05-19 (×8): 100 mg via ORAL
  Filled 2016-05-11 (×8): qty 1

## 2016-05-11 MED ORDER — POTASSIUM CHLORIDE CRYS ER 20 MEQ PO TBCR
20.0000 meq | EXTENDED_RELEASE_TABLET | Freq: Two times a day (BID) | ORAL | Status: AC
  Administered 2016-05-11 (×2): 20 meq via ORAL
  Filled 2016-05-11 (×2): qty 1

## 2016-05-11 MED ORDER — METOCLOPRAMIDE HCL 5 MG PO TABS
5.0000 mg | ORAL_TABLET | Freq: Two times a day (BID) | ORAL | Status: DC
  Administered 2016-05-11: 5 mg via ORAL
  Filled 2016-05-11: qty 1

## 2016-05-11 MED ORDER — LEVOTHYROXINE SODIUM 100 MCG PO TABS
100.0000 ug | ORAL_TABLET | Freq: Every day | ORAL | Status: DC
  Administered 2016-05-12 – 2016-05-19 (×8): 100 ug via ORAL
  Filled 2016-05-11 (×8): qty 1

## 2016-05-11 NOTE — Evaluation (Signed)
Clinical/Bedside Swallow Evaluation Patient Details  Name: Cassandra [name] MRN: [id number] Date of Birth: 12/18/1945  Today's Date: 05/11/2016 Time: SLP Start Time (ACUTE ONLY): 1012 SLP Stop Time (ACUTE ONLY): 1038 SLP Time Calculation (min) (ACUTE ONLY): 26 min  Past Medical History:  Past Medical History  Diagnosis Date  . Diabetes mellitus without complication (HCC)   . Thyroid disease   . 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 Depression   . Hypertension   . CKD (chronic kidney disease) stage 3, GFR 30-59 ml/min   . Hypothyroidism   . Vitamin B12 deficiency   . Benign paroxysmal positional vertigo   . Broken shoulder   . Broken toes   . Broken finger   . Migraines   . GERD (gastroesophageal reflux disease)   . Anxiety   . Interstitial cystitis     bladder stretched every 9 months  . Allergic rhinitis   . Glaucoma   . Benign hypertension with CKD (chronic kidney disease) stage III   . Tobacco use   . Cervicalgia   . Elevated liver enzymes Hep B/C neg 2014  . Bipolar disorder (HCC)   . History of alcohol use   . Psoriasis    Past Surgical History:  Past Surgical History  Procedure Laterality Date  . Appendectomy    . Tonsillectomy    . Cholecystectomy    . Hernia repair    . Cataract extraction    . Abdominal hysterectomy      with oophorectomy  . Bladder surgery    . Cervical disc surgery     HPI:  70 y.o. female with PMH of Type 1 Diabetes mellitus, Hypothyroidism, Hypertension, CKD stage 3 (GFR 30-59 ml/min), Tobacco use, GERD, elevated liver enzymes (Hep B/C neg 2014). She presents to the ER 05/06/2016 with altered mental status onset last night. On a good day pt is normally ambulatory and performs ADL's independently. Pt has been complaining of frequent migraines and vertigo for about the last year she is followed by ENT. Husband endorses a similar episode of confusion about 8 years ago, which turned out to be caused by a blood sugar  of over 1200. Patient is also a chronic alcohol user, husband states pt drinks everyday throughout the day for the past 10-15 years, last alcoholic beverage per husband was 05/05/2016. According to pts. husband pt began acting different around 5 or 6 pm 05/05/2016 with slight confusion. Pt tossed and mumbled throughout the night. On 05/06/2016 she had persistent confusion and the husband was unsure as to what he should do. Husband then received a call from the patient's ENT physicians office notifying him that the patient missed an appointment this morning. When he told them what was going on, they advised him to call 911. Upon EMS arrival CBG reading "High," pts. last dose of insulin was Sunday morning 05/05/2016. PCCM consulted 05/06/2016 for management of acute hypoxic respiratory failure, acute encephalopathy and DKA; CXR on  05/10/16 indicated Low lung volumes with mild bibasilar atelectasis and/or infiltrates again noted; nursing stated pt did well with medication administration and tolerating clear liquids/soft diet thusfar; pt had baseline coughing when SLP arrived prior to po intake  Assessment / Plan / Recommendation Clinical Impression   Pt exhibited immediate cough with thin liquids with larger volumes and pt is also distractible (d/t anxiety/confusion likely), but subsequent swallows of thin appeared [facility] with small sips; delayed cough noted with puree x1, but pt stated recent "reflux" dx  which may contribute as pt also c/o globus sensation during meals intermittently and belching noted after po intake during BSE as well.  Recommend Regular/thin consistency with esophageal precautions given as well as aspiration precautions d/t respiratory hx/recent self-extubation and other risk factors, pt is at a mild risk for aspiration without precautions in place; ST will f/u while in hospital for diet tolerance/use of precautions/pt/family education for safety.    Aspiration Risk  Mild aspiration risk    Diet  Recommendation   Regular/thin with small sips/bites  Medication Administration: Whole meds with liquid (as tolerated)    Other  Recommendations Oral Care Recommendations: Oral care BID   Follow up Recommendations  Other (comment) (TBD)    Frequency and Duration min 2x/week  1 week       Prognosis Prognosis for Safe Diet Advancement: Good      Swallow Study   General Date of Onset: 05/06/16 HPI: 70 y.o. female with PMH of Type 1 Diabetes mellitus, Hypothyroidism, Hypertension, CKD stage 3 (GFR 30-59 ml/min), Tobacco use, GERD, elevated liver enzymes (Hep B/C neg 2014). She presents to the ER 05/06/2016 with altered mental status onset last night. On a good day pt is normally ambulatory and performs ADL's independently. Pt has been complaining of frequent migraines and vertigo for about the last year she is followed by ENT. Husband endorses a similar episode of confusion about 8 years ago, which turned out to be caused by a blood sugar of over 1200. Patient is also a chronic alcohol user, husband states pt drinks everyday throughout the day for the past 10-15 years, last alcoholic beverage per husband was 05/05/2016. According to pts. husband pt began acting different around 5 or 6 pm 05/05/2016 with slight confusion. Pt tossed and mumbled throughout the night. On 05/06/2016 she had persistent confusion and the husband was unsure as to what he should do. Husband then received a call from the patient's ENT physicians office notifying him that the patient missed an appointment this morning. When he told them what was going on, they advised him to call 911. Upon EMS arrival CBG reading "High," pts. last dose of insulin was Sunday morning 05/05/2016. PCCM consulted 05/06/2016 for management of acute hypoxic respiratory failure, acute encephalopathy and DKA Type of Study: Bedside Swallow Evaluation Diet Prior to this Study: Dysphagia 3 (soft);Thin liquids Temperature Spikes Noted: Yes Respiratory  Status: Room air History of Recent Intubation: Yes Length of Intubations (days): 4 days Date extubated: 05/09/16 (self-extubated) Behavior/Cognition: Alert;Cooperative;Confused Oral Cavity Assessment: Within Functional Limits Oral Care Completed by SLP: No Oral Cavity - Dentition: Adequate natural dentition Vision: Functional for self-feeding Self-Feeding Abilities: Able to feed self Patient Positioning: Upright in bed Baseline Vocal Quality: Normal Volitional Cough: Strong Volitional Swallow: Able to elicit    Oral/Motor/Sensory Function Overall Oral Motor/Sensory Function: Within functional limits   Ice Chips Ice chips: Within functional limits Presentation: Spoon   Thin Liquid Thin Liquid: Impaired Presentation: Cup;Straw Pharyngeal  Phase Impairments: Cough - Immediate;Other (comments) (with larger sips/distractible)    Nectar Thick Nectar Thick Liquid: Not tested   Honey Thick Honey Thick Liquid: Not tested   Puree Puree: Impaired Presentation: Spoon Pharyngeal Phase Impairments: Cough - Delayed (x1) Other Comments:  (Pt stated she had recent dx of GERD)   Solid      Solid: Within functional limits Presentation: Self Fed        Lahari Suttles,PAT, M.S, CCC-SLP 05/11/2016,11:10 AM

## 2016-05-11 NOTE — Progress Notes (Signed)
Inpatient Rehabilitation  IP rehab consult has been ordered.  Admissions coordinator will follow up on Monday as appropriate once consult is completed and recommendations are made.    Weldon PickingSusan Lovenia Debruler PT Inpatient Rehab Admissions Coordinator Cell [phone] Office [phone]

## 2016-05-11 NOTE — Progress Notes (Addendum)
Patient ID: Cassandra [name], female   DOB: [birth year], 70 y.o.   MRN: [id number]  [facility] Progress Note   Assessment/ Plan:   1. Acute renal failure on chronic kidney disease stage III(Baseline creatinine 1.1-1.5): With evidence pointing towards ATN likely mediated by DKA/sepsis. Urine output improving and renal function continues to show sluggish improvement. Without any acute dialysis needs at this time-continue to follow closely with labs/physical exam while monitoring blood pressure and limiting hypotension/potential nephrotoxins exposure. Monitor urine output and electrolytes off of furosemide after transient dosing yesterday. 2. Diabetic ketoacidosis: Glycemic management per critical care with long and short-acting insulin.. 3. Septic shock: The exact source/etiology at this time remains unclear, she is on empiric antibiotic therapy with Zosyn-currently off pressors 4. Anemia: Likely chronic from chronic alcohol use/malnutrition. Status post intravenous iron 5. Chronic alcohol abuse: Sequela of chronic alcoholism-multiple electrolyte abnormalities and malnutrition. On withdrawal watch/protocol. 6. Diarrhea: Suspicious for Clostridium difficile colitis given recent antibiotic use and hospitalization-check C. difficile toxin 7. Hypokalemia: Likely from a combination of diarrhea and diuresis-replaced via oral route  Subjective:   Distressed by diarrhea overnight-intermittent abdominal pain.    Objective:   BP 156/65 mmHg  Pulse 91  Temp(Src) 98.3 F (36.8 C) (Oral)  Resp 18  Ht _0  (1.651 m)  Wt 57.3 kg (126 lb 5.2 oz)  BMI 21.02 kg/m2  SpO2 94%  Intake/Output Summary (Last 24 hours) at 05/11/16 0908 Last data filed at 05/11/16 0030  Gross per 24 hour  Intake    225 ml  Output   2225 ml  Net  -2000 ml   Weight change: -2.7 kg (-5 lb 15.2 oz)  Physical Exam: SWH:QPRFF, appears comfortable CVS: Regular rhythm, normal rate Resp: Coarse breath sounds-no  rales/rhonchi Abd: Soft, slightly distended, nontender Ext: No lower extremity edema  Imaging: Dg Chest Port 1 View  05/10/2016  CLINICAL DATA:  Respiratory failure. EXAM: PORTABLE CHEST 1 VIEW COMPARISON:  05/09/2016. FINDINGS: Interim removal of endotracheal tube and NG tube. Right IJ line in stable position. Heart size normal. Low lung volumes with mild bibasilar atelectasis and/or infiltrate. Small left pleural effusion. Prior cervical spine fusion. IMPRESSION: 1. Interim removal endotracheal tube and NG tube. Right IJ line stable position. 2. Low lung volumes with mild bibasilar atelectasis and/or infiltrates again noted. Electronically Signed   By: Marcello Moores  Register   On: 05/10/2016 07:42   Dg Abd Portable 1v  05/09/2016  CLINICAL DATA:  VOMITING EXAM: PORTABLE ABDOMEN - 1 VIEW COMPARISON:  11/19/2011 FINDINGS: Nasogastric tube is in place, tip overlying the level of the distal stomach or proximal duodenum. Surgical clips are identified in the right upper quadrant of the abdomen. Nonobstructive bowel gas pattern. Visualized osseous structures have a normal appearance. IMPRESSION: Nasogastric tube tip overlying the distal stomach or proximal duodenum. Electronically Signed   By: Nolon Nations M.D.   On: 05/09/2016 13:11    Labs: BMET  Recent Labs Lab 05/06/16 1514  05/07/16 0500 05/07/16 1100 05/07/16 1755 05/08/16 0400 05/08/16 1300 05/09/16 0413 05/10/16 0353 05/11/16 0537  NA  --   < > 146* 144 142 142 140 140 137 137  K  --   < > 2.7* 4.1 3.3* 3.6 3.6 3.4* 4.9 3.1*  CL  --   < > 109 110 109 108 110 111 105 104  CO2  --   < > _1 21*  GLUCOSE  --   < > 122* 103* 117*  131* 158* 48* 246* 181*  BUN  --   < > 36* 33* 33* 32* 30* 29* 24* 22*  CREATININE  --   < > 4.78* 3.08* 3.14* 2.97* 3.02* 2.87* 2.62* 2.42*  CALCIUM  --   < > 7.1* 6.7* 6.4* 6.8* 6.5* 6.5* 6.7* 7.4*  PHOS 10.4*  --  <1.0*  --   --  3.5  --  3.5 5.6* 2.8  < > = values in this interval not  displayed. CBC  Recent Labs Lab 05/06/16 1113  05/08/16 0400 05/09/16 0413 05/10/16 0353 05/11/16 0537  WBC 22.8*  < > 16.9* 10.4 10.9* 13.5*  NEUTROABS 18.6*  --   --   --   --   --   HGB 10.6*  < > 9.5* 8.9* 8.8* 9.1*  HCT 34.8*  < > 27.4* 26.2* 27.0* 27.7*  MCV 104.8*  < > 91.3 93.6 98.5 95.8  PLT 252  < > 187 146* 152 199  < > = values in this interval not displayed. Medications:    . antiseptic oral rinse  7 mL Mouth Rinse QID  . chlorhexidine gluconate (SAGE KIT)  15 mL Mouth Rinse BID  . escitalopram  10 mg Oral QHS  . folic acid  1 mg Intravenous Daily  . heparin  5,000 Units Subcutaneous Q8H  . insulin aspart  0-15 Units Subcutaneous Q4H  . levothyroxine  50 mcg Intravenous Daily  . metoCLOPramide (REGLAN) injection  5 mg Intravenous Q12H  . metolazone  10 mg Oral Once  . pantoprazole (PROTONIX) IV  40 mg Intravenous Q12H  . piperacillin-tazobactam (ZOSYN)  IV  2.25 g Intravenous Q8H  . pneumococcal 23 valent vaccine  0.5 mL Intramuscular Tomorrow-1000  . thiamine IV  100 mg Intravenous Daily   Elmarie Shiley, MD 05/11/2016, 9:08 AM

## 2016-05-11 NOTE — Progress Notes (Addendum)
Patient ID: Cassandra [name], female   DOB: 03-Jun-1946, 70 y.o.   MRN: [id number]   PROGRESS NOTE    Cassandra [name]  GEZ:[id number] DOB: 07-05-1946 DOA: 05/06/2016  PCP: Enid Derry, MD   Brief Narrative:  This is a 70 y.o. female with PMH of Type 1 Diabetes mellitus, Hypothyroidism, Hypertension, CKD stage 3 (GFR 30-59 ml/min), Tobacco use, GERD, elevated liver enzymes (Hep B/C neg 2014). She presented to the ER 05/06/2016 with altered mental status onset last night. Patient is also a chronic alcohol user, husband stated pt drinks everyday throughout the day for the past 10-15 years, last alcoholic beverage per husband was 05/05/2016.   PCCM consulted 05/06/2016 for management of acute hypoxic respiratory failure, acute encephalopathy and DKA  SIGNIFICANT EVENTS: 5/22 Pt arrived via EMS with AMS and elevated blood glucose level 5/22 Pt intubated in ED secondary to acute hypoxic respiratory distress 5/23 repletion of electrolytes  5/25 vomited 5/25 self extubation 5/27 TRH took over from PCCM   Assessment & Plan: Acute hypoxic respiratory failure  in pt with underlying COPD and Tobacco abuse - Self extubated 5/25 - stable oxygen saturations   Accelerated HTN - SBP in 160's  - monitor   Acute renal failure on chronic kidney disease stage III (Baseline creatinine 1.1-1.5) - ? ATN likely mediated by DKA/sepsis - renal function improving  - BMP in AM  - appreciate nephrology team following   Diabetic ketoacidosis, hypokalemia  - good CBG control  - keep on SSI for now  Septic shock - unclear etiology, ?UTI - currently on Zosyn - Continue abx as listed above,dc vanco 5/25 - Trend Procalcitonin's . 69->2.10  Anemia of chronic disease - alcohol use and malnutrition - no signs of bleeding - CBC in AM  Chronic alcohol abuse - keep on CIWA  Diarrhea - ? ABX induced  - monitor  Vomiting  - resolved   DKA  - resolved - on SSI for now   Acute encephalopathy -  Hx: Migraines, Bipolar Disorder, ETOH abuse, Benign paroxysmal positional vertigo - resolved   DVT prophylaxis: Heparin SQ Code Status: Full  Family Communication: Patient and daughter at bedside > 30 minutes update with daughter  Disposition Plan: Home likely next week   Consultants:   Nephrology   Procedures:   ETT>>5/22>>5/25 self extubation  Right IJ CVC>>5/22  R radial a-line 5/22>>>5/25  Antimicrobials:   Vancomycin 5/22>>5/25  Zosyn 5/22>   Subjective: Reports no dyspnea or chest pain.   Objective: Filed Vitals:   05/10/16 1200 05/10/16 1250 05/10/16 1959 05/11/16 0445  BP: 163/68 161/60 137/54 156/65  Pulse: 95 96 94 91  Temp:  100 F (37.8 C) 99.3 F (37.4 C) 98.3 F (36.8 C)  TempSrc:  Oral Oral Oral  Resp: '26 18 17 18  '$ Height:      Weight:    57.3 kg (126 lb 5.2 oz)  SpO2: 95% 99% 96% 94%    Intake/Output Summary (Last 24 hours) at 05/11/16 0637 Last data filed at 05/11/16 0030  Gross per 24 hour  Intake    500 ml  Output   2950 ml  Net  -2450 ml   Filed Weights   05/09/16 0700 05/10/16 0400 05/11/16 0445  Weight: 59.8 kg (131 lb 13.4 oz) 60 kg (132 lb 4.4 oz) 57.3 kg (126 lb 5.2 oz)    Examination:  General exam: Appears calm and comfortable  Respiratory system: diminished breath sounds at bases  Cardiovascular system: S1 & S2  heard, RRR. No JVD, murmurs, rubs, gallops or clicks. No pedal edema. Gastrointestinal system: Abdomen is nondistended, soft and nontender.  Central nervous system: Alert and oriented. No focal neurological deficits. Extremities: Symmetric 5 x 5 power.   Data Reviewed: I have personally reviewed following labs and imaging studies  CBC:  Recent Labs Lab 05/06/16 1113 05/06/16 1609 05/07/16 0500 05/08/16 0400 05/09/16 0413 05/10/16 0353  WBC 22.8* 22.8* 19.2* 16.9* 10.4 10.9*  NEUTROABS 18.6*  --   --   --   --   --   HGB 10.6* 9.5* 9.5* 9.5* 8.9* 8.8*  HCT 34.8* 30.6* 27.6* 27.4* 26.2* 27.0*  MCV  104.8* 101.7* 93.2 91.3 93.6 98.5  PLT 252 235 211 187 146* 239   Basic Metabolic Panel:  Recent Labs Lab 05/06/16 1514 05/06/16 1609  05/07/16 0500  05/07/16 1755 05/08/16 0400 05/08/16 1300 05/09/16 0413 05/10/16 0353  NA  --  139  < > 146*  < > 142 142 140 140 137  K  --  4.4  < > 2.7*  < > 3.3* 3.6 3.6 3.4* 4.9  CL  --  107  < > 109  < > 109 108 110 111 105  CO2  --  <7*  < > 23  < > '24 24 23 22 22  '$ GLUCOSE  --  465*  < > 122*  < > 117* 131* 158* 48* 246*  BUN  --  46*  < > 36*  < > 33* 32* 30* 29* 24*  CREATININE  --  3.88*  < > 4.78*  < > 3.14* 2.97* 3.02* 2.87* 2.62*  CALCIUM  --  7.4*  < > 7.1*  < > 6.4* 6.8* 6.5* 6.5* 6.7*  MG 2.6* 2.2  --  1.4*  --   --  2.5*  --  2.4 2.0  PHOS 10.4*  --   --  <1.0*  --   --  3.5  --  3.5 5.6*  < > = values in this interval not displayed. GFR: Estimated Creatinine Clearance: 18.5 mL/min (by C-G formula based on Cr of 2.62). Liver Function Tests:  Recent Labs Lab 05/06/16 1113 05/08/16 0400  AST 34  --   ALT 15  --   ALKPHOS 50  --   BILITOT 1.6*  --   PROT 5.6*  --   ALBUMIN 3.3* 1.8*    Recent Labs Lab 05/10/16 0359  LIPASE 21  AMYLASE 55    Recent Labs Lab 05/06/16 1113  AMMONIA 61*   Coagulation Profile:  Recent Labs Lab 05/06/16 1113  INR 1.13   CBG:  Recent Labs Lab 05/10/16 1133 05/10/16 1651 05/10/16 1957 05/11/16 0001 05/11/16 0403  GLUCAP 104* 343* 335* 110* 146*   Recent Results (from the past 240 hour(s))  Blood Cultures (routine x 2)     Status: None (Preliminary result)   Collection Time: 05/06/16 11:13 AM  Result Value Ref Range Status   Specimen Description BLOOD LEFT HAND  Final   Special Requests IN PEDIATRIC BOTTLE  2CC  Final   Culture NO GROWTH 4 DAYS  Final   Report Status PENDING  Incomplete  Blood Cultures (routine x 2)     Status: None (Preliminary result)   Collection Time: 05/06/16 11:17 AM  Result Value Ref Range Status   Specimen Description BLOOD RIGHT HAND   Final   Special Requests IN PEDIATRIC BOTTLE 1CC  Final   Culture NO GROWTH 4 DAYS  Final  Report Status PENDING  Incomplete  Urine culture     Status: Abnormal   Collection Time: 05/06/16 11:36 AM  Result Value Ref Range Status   Specimen Description URINE, CATHETERIZED  Final   Special Requests NONE  Final   Culture <10,000 COLONIES/mL INSIGNIFICANT GROWTH (A)  Final   Report Status 05/07/2016 FINAL  Final  MRSA PCR Screening     Status: None   Collection Time: 05/06/16  3:33 PM  Result Value Ref Range Status   MRSA by PCR NEGATIVE NEGATIVE Final    Comment:        The GeneXpert MRSA Assay (FDA approved for NASAL specimens only), is one component of a comprehensive MRSA colonization surveillance program. It is not intended to diagnose MRSA infection nor to guide or monitor treatment for MRSA infections.       Radiology Studies: Dg Chest Port 1 View  05/10/2016  CLINICAL DATA:  Respiratory failure. EXAM: PORTABLE CHEST 1 VIEW COMPARISON:  05/09/2016. FINDINGS: Interim removal of endotracheal tube and NG tube. Right IJ line in stable position. Heart size normal. Low lung volumes with mild bibasilar atelectasis and/or infiltrate. Small left pleural effusion. Prior cervical spine fusion. IMPRESSION: 1. Interim removal endotracheal tube and NG tube. Right IJ line stable position. 2. Low lung volumes with mild bibasilar atelectasis and/or infiltrates again noted. Electronically Signed   By: Marcello Moores  Register   On: 05/10/2016 07:42   Dg Abd Portable 1v  05/09/2016  CLINICAL DATA:  VOMITING EXAM: PORTABLE ABDOMEN - 1 VIEW COMPARISON:  11/19/2011 FINDINGS: Nasogastric tube is in place, tip overlying the level of the distal stomach or proximal duodenum. Surgical clips are identified in the right upper quadrant of the abdomen. Nonobstructive bowel gas pattern. Visualized osseous structures have a normal appearance. IMPRESSION: Nasogastric tube tip overlying the distal stomach or proximal  duodenum. Electronically Signed   By: Nolon Nations M.D.   On: 05/09/2016 13:11      Scheduled Meds: . antiseptic oral rinse  7 mL Mouth Rinse QID  . chlorhexidine gluconate (SAGE KIT)  15 mL Mouth Rinse BID  . escitalopram  10 mg Oral QHS  . folic acid  1 mg Intravenous Daily  . heparin  5,000 Units Subcutaneous Q8H  . insulin aspart  0-15 Units Subcutaneous Q4H  . levothyroxine  50 mcg Intravenous Daily  . metoCLOPramide (REGLAN) injection  5 mg Intravenous Q12H  . metolazone  10 mg Oral Once  . pantoprazole (PROTONIX) IV  40 mg Intravenous Q12H  . piperacillin-tazobactam (ZOSYN)  IV  2.25 g Intravenous Q8H  . pneumococcal 23 valent vaccine  0.5 mL Intramuscular Tomorrow-1000  . thiamine IV  100 mg Intravenous Daily   Continuous Infusions: . sodium chloride 10 mL/hr at 05/10/16 2013  . dextrose 5 % and 0.45% NaCl 75 mL/hr at 05/10/16 1200     LOS: 5 days    Time spent: 20 minutes    Faye Ramsay, MD [facility] Pager [phone]  If 7PM-7AM, please contact night-coverage [email or web address] Password [facility] 05/11/2016, 6:37 AM

## 2016-05-11 NOTE — Evaluation (Signed)
Physical Therapy Evaluation Patient Details Name: Cassandra [name] MRN: [id number] DOB: May 16, 1946 Today's Date: 05/11/2016   History of Present Illness  70 y.o. female with PMH of Type 1 Diabetes mellitus, Hypothyroidism, Hypertension, CKD stage 3 (GFR 30-59 ml/min), Tobacco use, GERD, elevated liver enzymes (Hep B/C neg 2014). She presents to the ER 05/06/2016 with altered mental status onset last night. On a good day pt is normally ambulatory and performs ADL's independently. Pt has been complaining of frequent migraines and vertigo for about the last year she is followed by ENT. Husband endorses a similar episode of confusion about 8 years ago, which turned out to be caused by a blood sugar of over 1200. Patient is also a chronic alcohol user, husband states pt drinks everyday throughout the day for the past 10-15 years, last alcoholic beverage per husband was 05/05/2016. According to pts. husband pt began acting different around 5 or 6 pm 05/05/2016 with slight confusion. Pt tossed and mumbled throughout the night. On 05/06/2016 she had persistent confusion and the husband was unsure as to what he should do. Husband then received a call from the patient's ENT physicians office notifying him that the patient missed an appointment this morning. When he told them what was going on, they advised him to call 911. Upon EMS arrival CBG reading "High," pts. last dose of insulin was Sunday morning 05/05/2016. PCCM consulted 05/06/2016 for management of acute hypoxic respiratory failure, acute encephalopathy and DKA; CXR on 05/10/16 indicated Low lung volumes with mild bibasilar atelectasis and/or infiltrates again noted; nursing stated pt did well with medication administration and tolerating clear liquids/soft diet thusfar; pt had baseline coughing when SLP arrived prior to po intake  Clinical Impression  Pt admitted with above diagnosis. Pt currently with functional limitations due to the deficits listed below  (see PT Problem List). Pt declined to do more than bed level eval today.  Pt was able to exercise and roll in bed.  Husband supportive.  Agree with CIR with hopes that pt will feel like doing more next session. IF she continues to have low energy, may need SNF.  Will follow acutely.  Pt will benefit from skilled PT to increase their independence and safety with mobility to allow discharge to the venue listed below.      Follow Up Recommendations CIR;Supervision/Assistance - 24 hour    Equipment Recommendations  Other (comment) (TBA)    Recommendations for Other Services Rehab consult     Precautions / Restrictions Precautions Precautions: Fall Restrictions Weight Bearing Restrictions: No      Mobility  Bed Mobility Overal bed mobility: Needs Assistance Bed Mobility: Rolling Rolling: Min assist         General bed mobility comments: Only agreed to roll and exercise.  "I am not getting up today."  Transfers                 General transfer comment: pt declined  Ambulation/Gait                Stairs            Wheelchair Mobility    Modified Rankin (Stroke Patients Only)       Balance                                             Pertinent Vitals/Pain Pain Assessment: 0-10 Pain  Score: 8  Pain Location: generalized Pain Descriptors / Indicators: Aching Pain Intervention(s): Limited activity within patient's tolerance;Monitored during session;Repositioned  VSS    Home Living Family/patient expects to be discharged to:: Private residence Living Arrangements: Spouse/significant other Available Help at Discharge: Family;Available 24 hours/day Type of Home: Apartment Home Access: Level entry     Home Layout: One level Home Equipment: None Additional Comments: Husband does all cooking, cleaning and laundry as well as grocery shopping.     Prior Function Level of Independence: Independent         Comments: Husband  reports 2-3 falls in the last few months.      Hand Dominance        Extremity/Trunk Assessment   Upper Extremity Assessment: Defer to OT evaluation           Lower Extremity Assessment: Generalized weakness         Communication   Communication: No difficulties  Cognition Arousal/Alertness: Awake/alert Behavior During Therapy: Anxious Overall Cognitive Status: History of cognitive impairments - at baseline Area of Impairment: Following commands;Safety/judgement;Awareness;Problem solving     Memory: Decreased short-term memory Following Commands: Follows one step commands inconsistently Safety/Judgement: Decreased awareness of safety Awareness: Intellectual Problem Solving: Difficulty sequencing;Requires verbal cues;Decreased initiation      General Comments      Exercises General Exercises - Upper Extremity Shoulder Flexion: AROM;Both;5 reps;Supine Shoulder Horizontal ABduction: AROM;Both;5 reps;Supine Elbow Flexion: AROM;Both;5 reps;Supine General Exercises - Lower Extremity Ankle Circles/Pumps: AROM;Both;10 reps;Supine Heel Slides: AROM;Both;5 reps;Supine      Assessment/Plan    PT Assessment Patient needs continued PT services  PT Diagnosis Generalized weakness   PT Problem List Decreased mobility;Decreased balance;Decreased activity tolerance;Decreased knowledge of use of DME;Decreased safety awareness;Decreased knowledge of precautions;Cardiopulmonary status limiting activity;Pain;Decreased strength  PT Treatment Interventions DME instruction;Gait training;Functional mobility training;Therapeutic activities;Therapeutic exercise;Balance training;Patient/family education   PT Goals (Current goals can be found in the Care Plan section) Acute Rehab PT Goals Patient Stated Goal: to go home PT Goal Formulation: With patient Time For Goal Achievement: 05/25/16 Potential to Achieve Goals: Fair    Frequency Min 3X/week   Barriers to discharge         Co-evaluation               End of Session   Activity Tolerance: Patient limited by fatigue (self limiting) Patient left: in bed;with call bell/phone within reach;with bed alarm set;with family/visitor present Nurse Communication: Mobility status         Time: [id number] PT Time Calculation (min) (ACUTE ONLY): 10 min   Charges:   PT Evaluation $PT Eval Moderate Complexity: 1 Procedure     PT G CodesBerline Lopes:        Samanvi Cuccia F 05/11/2016, 4:33 PM [facility] HospitalDawn Hezikiah Retzloff,PT Acute Rehabilitation [phone] (pager)

## 2016-05-12 ENCOUNTER — Inpatient Hospital Stay (HOSPITAL_COMMUNITY): Payer: Medicare Other

## 2016-05-12 LAB — CBC
HCT: 25.6 % — ABNORMAL LOW (ref 36.0–46.0)
Hemoglobin: 8.6 g/dL — ABNORMAL LOW (ref 12.0–15.0)
MCH: 31.7 pg (ref 26.0–34.0)
MCHC: 33.6 g/dL (ref 30.0–36.0)
MCV: 94.5 fL (ref 78.0–100.0)
PLATELETS: 235 10*3/uL (ref 150–400)
RBC: 2.71 MIL/uL — AB (ref 3.87–5.11)
RDW: 12.9 % (ref 11.5–15.5)
WBC: 9.1 10*3/uL (ref 4.0–10.5)

## 2016-05-12 LAB — GLUCOSE, CAPILLARY
GLUCOSE-CAPILLARY: 104 mg/dL — AB (ref 65–99)
GLUCOSE-CAPILLARY: 150 mg/dL — AB (ref 65–99)
GLUCOSE-CAPILLARY: 194 mg/dL — AB (ref 65–99)
GLUCOSE-CAPILLARY: 207 mg/dL — AB (ref 65–99)
GLUCOSE-CAPILLARY: 88 mg/dL (ref 65–99)
Glucose-Capillary: 119 mg/dL — ABNORMAL HIGH (ref 65–99)

## 2016-05-12 LAB — BASIC METABOLIC PANEL
ANION GAP: 9 (ref 5–15)
BUN: 17 mg/dL (ref 6–20)
CALCIUM: 8 mg/dL — AB (ref 8.9–10.3)
CO2: 24 mmol/L (ref 22–32)
Chloride: 108 mmol/L (ref 101–111)
Creatinine, Ser: 1.93 mg/dL — ABNORMAL HIGH (ref 0.44–1.00)
GFR, EST AFRICAN AMERICAN: 30 mL/min — AB (ref >60)
GFR, EST NON AFRICAN AMERICAN: 26 mL/min — AB (ref >60)
Glucose, Bld: 93 mg/dL (ref 65–99)
POTASSIUM: 3.4 mmol/L — AB (ref 3.5–5.1)
SODIUM: 141 mmol/L (ref 135–145)

## 2016-05-12 MED ORDER — PIPERACILLIN-TAZOBACTAM 3.375 G IVPB
3.3750 g | Freq: Three times a day (TID) | INTRAVENOUS | Status: DC
  Administered 2016-05-12 – 2016-05-14 (×6): 3.375 g via INTRAVENOUS
  Filled 2016-05-12 (×8): qty 50

## 2016-05-12 MED ORDER — PANTOPRAZOLE SODIUM 40 MG PO TBEC
40.0000 mg | DELAYED_RELEASE_TABLET | Freq: Two times a day (BID) | ORAL | Status: DC
  Administered 2016-05-12 – 2016-05-19 (×14): 40 mg via ORAL
  Filled 2016-05-12 (×14): qty 1

## 2016-05-12 MED ORDER — HYDRALAZINE HCL 20 MG/ML IJ SOLN
10.0000 mg | Freq: Four times a day (QID) | INTRAMUSCULAR | Status: DC | PRN
  Administered 2016-05-12 – 2016-05-17 (×7): 10 mg via INTRAVENOUS
  Filled 2016-05-12 (×7): qty 1

## 2016-05-12 MED ORDER — CARVEDILOL 12.5 MG PO TABS
12.5000 mg | ORAL_TABLET | Freq: Two times a day (BID) | ORAL | Status: DC
  Administered 2016-05-12 – 2016-05-19 (×15): 12.5 mg via ORAL
  Filled 2016-05-12 (×15): qty 1

## 2016-05-12 MED ORDER — POTASSIUM CHLORIDE CRYS ER 20 MEQ PO TBCR
20.0000 meq | EXTENDED_RELEASE_TABLET | Freq: Three times a day (TID) | ORAL | Status: AC
  Administered 2016-05-12 – 2016-05-13 (×4): 20 meq via ORAL
  Filled 2016-05-12 (×4): qty 1

## 2016-05-12 NOTE — Progress Notes (Signed)
CSW spoke with patient and spouse, Leonette MostCharles, at bedside. CSW explained CIR vs. SNF discharge planning. Patient is refusing SNF. Spouse stated that at this time, they do not want CSW to send SNF referral and are hopeful for CIR admission.  CSW signing off. Please re-consult if needed.   Osborne Cascoadia Zonnie Landen LCSWA [phone]

## 2016-05-12 NOTE — Progress Notes (Addendum)
Patient ID: Cassandra [name], female   DOB: [birth year], 70 y.o.   MRN: [id number]  [facility] Progress Note   Assessment/ Plan:   1. Acute renal failure on chronic kidney disease stage III(Baseline creatinine 1.1-1.5): With evidence pointing towards ATN likely mediated by DKA/sepsis. Urine output acceptable off furosemide with slowly improving renal function. 2. Diabetic ketoacidosis: Glycemic management per critical care with long and short-acting insulin. 3. Septic shock: The exact source/etiology at this time remains unclear, she is on empiric antibiotic therapy with Zosyn-currently off pressors 4. Anemia: Likely chronic from chronic alcohol use/malnutrition. Status post intravenous iron 5. Chronic alcohol abuse: Sequela of chronic alcoholism-multiple electrolyte abnormalities and malnutrition. On withdrawal watch/protocol. 6. Diarrhea: Recently restarted metoclopramide, will discontinue this-C. difficile testing negative. 7. Hypokalemia: Likely from a combination of diarrhea and diuresis-Labs pending from this morning, will replace via oral route 8. Hypertension: With hypertension, tachycardia and requiring several doses of when necessary hydralazine-restart carvedilol that she was taking as an outpatient.  Subjective:   Reports to be feeling poorly this morning after a restless night because of diarrhea-denies any nausea, chest pain or shortness of breath.    Objective:   BP 161/78 mmHg  Pulse 102  Temp(Src) 98.3 F (36.8 C) (Oral)  Resp 24  Ht '5\' 5"'$  (1.651 m)  Wt 51.619 kg (113 lb 12.8 oz)  BMI 18.94 kg/m2  SpO2 97%  Intake/Output Summary (Last 24 hours) at 05/12/16 0937 Last data filed at 05/12/16 0554  Gross per 24 hour  Intake     62 ml  Output   1505 ml  Net  -1443 ml   Weight change: -5.681 kg (-12 lb 8.4 oz)  Physical Exam: KNL:ZJQBH, appears comfortable CVS: Regular rhythm, normal rate Resp: Coarse breath sounds-no rales/rhonchi Abd: Soft,  slightly distended, nontender Ext: No lower extremity edema  Imaging: No results found.  Labs: BMET  Recent Labs Lab 05/06/16 1514  05/07/16 0500 05/07/16 1100 05/07/16 1755 05/08/16 0400 05/08/16 1300 05/09/16 0413 05/10/16 0353 05/11/16 0537  NA  --   < > 146* 144 142 142 140 140 137 137  K  --   < > 2.7* 4.1 3.3* 3.6 3.6 3.4* 4.9 3.1*  CL  --   < > 109 110 109 108 110 111 105 104  CO2  --   < > '23 24 24 24 23 22 22 '$ 21*  GLUCOSE  --   < > 122* 103* 117* 131* 158* 48* 246* 181*  BUN  --   < > 36* 33* 33* 32* 30* 29* 24* 22*  CREATININE  --   < > 4.78* 3.08* 3.14* 2.97* 3.02* 2.87* 2.62* 2.42*  CALCIUM  --   < > 7.1* 6.7* 6.4* 6.8* 6.5* 6.5* 6.7* 7.4*  PHOS 10.4*  --  <1.0*  --   --  3.5  --  3.5 5.6* 2.8  < > = values in this interval not displayed. CBC  Recent Labs Lab 05/06/16 1113  05/09/16 0413 05/10/16 0353 05/11/16 0537 05/12/16 0909  WBC 22.8*  < > 10.4 10.9* 13.5* 9.1  NEUTROABS 18.6*  --   --   --   --   --   HGB 10.6*  < > 8.9* 8.8* 9.1* 8.6*  HCT 34.8*  < > 26.2* 27.0* 27.7* 25.6*  MCV 104.8*  < > 93.6 98.5 95.8 94.5  PLT 252  < > 146* 152 199 235  < > = values in this interval not displayed. Medications:    .  antiseptic oral rinse  7 mL Mouth Rinse QID  . chlorhexidine gluconate (SAGE KIT)  15 mL Mouth Rinse BID  . escitalopram  10 mg Oral QHS  . folic acid  1 mg Oral Daily  . heparin  5,000 Units Subcutaneous Q8H  . insulin aspart  0-15 Units Subcutaneous Q4H  . levothyroxine  100 mcg Oral QAC breakfast  . pantoprazole (PROTONIX) IV  40 mg Intravenous Q12H  . piperacillin-tazobactam (ZOSYN)  IV  2.25 g Intravenous Q8H  . pneumococcal 23 valent vaccine  0.5 mL Intramuscular Tomorrow-1000  . potassium chloride  20 mEq Oral TID  . thiamine  100 mg Oral Daily   Elmarie Shiley, MD 05/12/2016, 9:37 AM

## 2016-05-12 NOTE — Progress Notes (Signed)
ANTIBIOTIC CONSULT NOTE - INITIAL  Pharmacy Consult for Zosyn Indication: sepsis  Allergies  Allergen Reactions  . Ciprofloxacin Other (See Comments)    No reaction specified  . Codeine Diarrhea and Nausea And Vomiting  . Doxycycline Diarrhea and Nausea And Vomiting  . Hydrocodone   . Omnicef [Cefdinir] Nausea Only and Other (See Comments)    Constipation, tolerated Zosyn    Patient Measurements: Height: 5\' 5"  (165.1 cm) Weight: 113 lb 12.8 oz (51.619 kg) IBW/kg (Calculated) : 57 Adjusted Body Weight:    Vital Signs: Temp: 98.3 F (36.8 C) (05/28 0350) Temp Source: Oral (05/28 0350) BP: 187/88 mmHg (05/28 1049) Pulse Rate: 92 (05/28 1049) Intake/Output from previous day: 05/27 0701 - 05/28 0700 In: 182 [P.O.:182] Out: 3105 [Urine:3105] Intake/Output from this shift:    Labs:  Recent Labs  05/10/16 0353 05/11/16 0537 05/12/16 0909  WBC 10.9* 13.5* 9.1  HGB 8.8* 9.1* 8.6*  PLT 152 199 235  CREATININE 2.62* 2.42* 1.93*   Estimated Creatinine Clearance: 22.7 mL/min (by C-G formula based on Cr of 1.93). No results for input(s): VANCOTROUGH, VANCOPEAK, VANCORANDOM, GENTTROUGH, GENTPEAK, GENTRANDOM, TOBRATROUGH, TOBRAPEAK, TOBRARND, AMIKACINPEAK, AMIKACINTROU, AMIKACIN in the last 72 hours.   Microbiology:   Medical History: Past Medical History  Diagnosis Date  . Diabetes mellitus without complication (HCC)   . Thyroid disease   . 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 Depression   . Hypertension   . CKD (chronic kidney disease) stage 3, GFR 30-59 ml/min   . Hypothyroidism   . Vitamin B12 deficiency   . Benign paroxysmal positional vertigo   . Broken shoulder   . Broken toes   . Broken finger   . Migraines   . GERD (gastroesophageal reflux disease)   . Anxiety   . Interstitial cystitis     bladder stretched every 9 months  . Allergic rhinitis   . Glaucoma   . Benign hypertension with CKD (chronic kidney disease) stage III   .  Tobacco use   . Cervicalgia   . Elevated liver enzymes Hep B/C neg 2014  . Bipolar disorder (HCC)   . History of alcohol use   . Psoriasis    Assessment:  Infectious Disease: abx for sepsis, WBC 10.9>13.5>9.1, PCT 2.1. Scr 1.93 slowly declining. Plan to check chest CT for dyspnea.  5/22 vanc > 5/25 5/22 zosyn >  5/22 mrsa: neg 5/22 blood cx: ngtd 5/22 urine cx: insignificant growth   Goal of Therapy:  Eradication of infection  Plan:  Increase Zosyn to 3.375g IV q8hr.  Oneal Biglow S. Merilynn Finlandobertson, PharmD, BCPS Clinical Staff Pharmacist Pager [phone]  Misty Stanleyobertson, Taren Toops Stillinger 05/12/2016,11:51 AM

## 2016-05-12 NOTE — Progress Notes (Addendum)
NP Claiborne Billingsallahan notified of pts elevated BP, new orders received.  RN will continue to monitor.  Erenest Rasheraldwell,Chantea Surace B, RN

## 2016-05-12 NOTE — Progress Notes (Signed)
Patient ID: Cassandra [name], female   DOB: 19-Jan-1946, 70 y.o.   MRN: [id number]   PROGRESS NOTE    Marykay Mccleod Pultz  RWC:[id number] DOB: [birth year] DOA: 05/06/2016  PCP: Baruch Gouty, MD   Brief Narrative:  This is a 70 y.o. female with PMH of Type 1 Diabetes mellitus, Hypothyroidism, Hypertension, CKD stage 3 (GFR 30-59 ml/min), Tobacco use, GERD, elevated liver enzymes (Hep B/C neg 2014). She presented to the ER 05/06/2016 with altered mental status onset last night. Patient is also a chronic alcohol user, husband stated pt drinks everyday throughout the day for the past 10-15 years, last alcoholic beverage per husband was 05/05/2016.   PCCM consulted 05/06/2016 for management of acute hypoxic respiratory failure, acute encephalopathy and DKA  SIGNIFICANT EVENTS: 5/22 Pt arrived via EMS with AMS and elevated blood glucose level 5/22 Pt intubated in ED secondary to acute hypoxic respiratory distress 5/23 repletion of electrolytes  5/25 vomited 5/25 self extubation 5/27 TRH took over from PCCM   Assessment & Plan: Acute hypoxic respiratory failure  in pt with underlying COPD and Tobacco abuse - Self extubated 5/25 - more dyspnea this AM, CT chest requested as pt already on ABX so do not suspect PNA - there is however, more dullness to percussion left lower lobe area with some rhonchi but could be atelectasis - follow up on CT chest   Accelerated HTN - SBP in 180's this AM - currently on Coreg - add hydralazine as needed for better control   Acute renal failure on chronic kidney disease stage III (Baseline creatinine 1.1-1.5) - ? ATN likely mediated by DKA/sepsis - renal function improving  - BMP in AM   Diabetic ketoacidosis, hypokalemia  - good CBG control  - supplement K and repeat BMP in AM - keep on SSI for now  Septic shock - unclear etiology, ?UTI - currently on Zosyn - Continue abx as listed above,dc vanco 5/25 - Trend Procalcitonin's . 69->2.10 - WBC is  WNL, pt afebrile - will follow up on CT chest today   Anemia of chronic disease - alcohol use and malnutrition - no signs of bleeding - CBC in AM  Chronic alcohol abuse - keep on CIWA  Diarrhea - ? ABX induced  - monitor  Vomiting  - resolved   DKA  - resolved - on SSI for now   Acute encephalopathy - Hx: Migraines, Bipolar Disorder, ETOH abuse, Benign paroxysmal positional vertigo - resolved   DVT prophylaxis: Heparin SQ Code Status: Full  Family Communication: Patient and daughter at bedside > 30 minutes update with daughter  Disposition Plan: Home likely next week   Consultants:   Nephrology   Procedures:   ETT>>5/22>>5/25 self extubation  Right IJ CVC>>5/22  R radial a-line 5/22>>>5/25  Antimicrobials:   Vancomycin 5/22>>5/25  Zosyn 5/22>   Subjective: Reports more dyspnea this AM and nausea but no vomiting.   Objective: Filed Vitals:   05/12/16 0350 05/12/16 0400 05/12/16 0535 05/12/16 1049  BP: 175/83  161/78 187/88  Pulse: 102   92  Temp: 98.3 F (36.8 C)     TempSrc: Oral     Resp: 36  24   Height:      Weight:   51.619 kg (113 lb 12.8 oz)   SpO2: 89% 96% 97%     Intake/Output Summary (Last 24 hours) at 05/12/16 1129 Last data filed at 05/12/16 0554  Gross per 24 hour  Intake     62 ml  Output  1505 ml  Net  -1443 ml   Filed Weights   05/10/16 0400 05/11/16 0445 05/12/16 0535  Weight: 60 kg (132 lb 4.4 oz) 57.3 kg (126 lb 5.2 oz) 51.619 kg (113 lb 12.8 oz)    Examination:  General exam: Appears calm and comfortable  Respiratory system: rhonchi at the left lower lobe area  Cardiovascular system: S1 & S2 heard, RRR. No JVD, murmurs, rubs, gallops or clicks. No pedal edema. Gastrointestinal system: Abdomen is nondistended, soft and nontender.  Central nervous system: Alert and oriented. No focal neurological deficits. Extremities: Symmetric 5 x 5 power.   Data Reviewed: I have personally reviewed following labs and imaging  studies  CBC:  Recent Labs Lab 05/06/16 1113  05/08/16 0400 05/09/16 0413 05/10/16 0353 05/11/16 0537 05/12/16 0909  WBC 22.8*  < > 16.9* 10.4 10.9* 13.5* 9.1  NEUTROABS 18.6*  --   --   --   --   --   --   HGB 10.6*  < > 9.5* 8.9* 8.8* 9.1* 8.6*  HCT 34.8*  < > 27.4* 26.2* 27.0* 27.7* 25.6*  MCV 104.8*  < > 91.3 93.6 98.5 95.8 94.5  PLT 252  < > 187 146* 152 199 235  < > = values in this interval not displayed. Basic Metabolic Panel:  Recent Labs Lab 05/07/16 0500  05/08/16 0400 05/08/16 1300 05/09/16 0413 05/10/16 0353 05/11/16 0537 05/12/16 0909  NA 146*  < > 142 140 140 137 137 141  K 2.7*  < > 3.6 3.6 3.4* 4.9 3.1* 3.4*  CL 109  < > 108 110 111 105 104 108  CO2 23  < > '24 23 22 22 '$ 21* 24  GLUCOSE 122*  < > 131* 158* 48* 246* 181* 93  BUN 36*  < > 32* 30* 29* 24* 22* 17  CREATININE 4.78*  < > 2.97* 3.02* 2.87* 2.62* 2.42* 1.93*  CALCIUM 7.1*  < > 6.8* 6.5* 6.5* 6.7* 7.4* 8.0*  MG 1.4*  --  2.5*  --  2.4 2.0 1.7  --   PHOS <1.0*  --  3.5  --  3.5 5.6* 2.8  --   < > = values in this interval not displayed. GFR: Estimated Creatinine Clearance: 22.7 mL/min (by C-G formula based on Cr of 1.93). Liver Function Tests:  Recent Labs Lab 05/06/16 1113 05/08/16 0400  AST 34  --   ALT 15  --   ALKPHOS 50  --   BILITOT 1.6*  --   PROT 5.6*  --   ALBUMIN 3.3* 1.8*    Recent Labs Lab 05/10/16 0359  LIPASE 21  AMYLASE 55    Recent Labs Lab 05/06/16 1113  AMMONIA 61*   Coagulation Profile:  Recent Labs Lab 05/06/16 1113  INR 1.13   CBG:  Recent Labs Lab 05/11/16 1608 05/11/16 2012 05/12/16 0001 05/12/16 0355 05/12/16 0913  GLUCAP 196* 187* 104* 207* 88   Recent Results (from the past 240 hour(s))  Blood Cultures (routine x 2)     Status: None   Collection Time: 05/06/16 11:13 AM  Result Value Ref Range Status   Specimen Description BLOOD LEFT HAND  Final   Special Requests IN PEDIATRIC BOTTLE  [facility]  Final   Culture NO GROWTH 5 DAYS  Final    Report Status 05/11/2016 FINAL  Final  Blood Cultures (routine x 2)     Status: None   Collection Time: 05/06/16 11:17 AM  Result Value Ref Range Status  Specimen Description BLOOD RIGHT HAND  Final   Special Requests IN PEDIATRIC BOTTLE [place]  Final   Culture NO GROWTH 5 DAYS  Final   Report Status 05/11/2016 FINAL  Final  Urine culture     Status: Abnormal   Collection Time: 05/06/16 11:36 AM  Result Value Ref Range Status   Specimen Description URINE, CATHETERIZED  Final   Special Requests NONE  Final   Culture <10,000 COLONIES/mL INSIGNIFICANT GROWTH (A)  Final   Report Status 05/07/2016 FINAL  Final  MRSA PCR Screening     Status: None   Collection Time: 05/06/16  3:33 PM  Result Value Ref Range Status   MRSA by PCR NEGATIVE NEGATIVE Final    Comment:        The GeneXpert MRSA Assay (FDA approved for NASAL specimens only), is one component of a comprehensive MRSA colonization surveillance program. It is not intended to diagnose MRSA infection nor to guide or monitor treatment for MRSA infections.   C difficile quick scan w PCR reflex     Status: None   Collection Time: 05/11/16 12:02 PM  Result Value Ref Range Status   C Diff antigen NEGATIVE NEGATIVE Final   C Diff toxin NEGATIVE NEGATIVE Final   C Diff interpretation Negative for toxigenic C. difficile  Final      Radiology Studies: No results found.    Scheduled Meds: . antiseptic oral rinse  7 mL Mouth Rinse QID  . carvedilol  12.5 mg Oral BID WC  . chlorhexidine gluconate (SAGE KIT)  15 mL Mouth Rinse BID  . escitalopram  10 mg Oral QHS  . folic acid  1 mg Oral Daily  . heparin  5,000 Units Subcutaneous Q8H  . insulin aspart  0-15 Units Subcutaneous Q4H  . levothyroxine  100 mcg Oral QAC breakfast  . pantoprazole (PROTONIX) IV  40 mg Intravenous Q12H  . piperacillin-tazobactam (ZOSYN)  IV  2.25 g Intravenous Q8H  . pneumococcal 23 valent vaccine  0.5 mL Intramuscular Tomorrow-1000  . potassium  chloride  20 mEq Oral TID  . thiamine  100 mg Oral Daily   Continuous Infusions: . sodium chloride 10 mL/hr at 05/10/16 2013  . dextrose 5 % and 0.45% NaCl 75 mL/hr at 05/10/16 1200     LOS: 6 days    Time spent: 20 minutes    Faye Ramsay, MD [facility] Pager [phone]  If 7PM-7AM, please contact night-coverage [email or web address] Password [facility] 05/12/2016, 11:29 AM

## 2016-05-12 NOTE — Progress Notes (Signed)
CT chest reviewed, order placed for ECHOcardiogram, D/W findings with PCCM.  Debbora PrestoMAGICK-Dorianna Mckiver, MD  [facility] Pager [phone]  If 7PM-7AM, please contact night-coverage [email or web address] Password [email or web address]

## 2016-05-13 LAB — CBC
HCT: 25.3 % — ABNORMAL LOW (ref 36.0–46.0)
Hemoglobin: 8.2 g/dL — ABNORMAL LOW (ref 12.0–15.0)
MCH: 31.4 pg (ref 26.0–34.0)
MCHC: 32.4 g/dL (ref 30.0–36.0)
MCV: 96.9 fL (ref 78.0–100.0)
PLATELETS: 291 10*3/uL (ref 150–400)
RBC: 2.61 MIL/uL — AB (ref 3.87–5.11)
RDW: 13.5 % (ref 11.5–15.5)
WBC: 8.6 10*3/uL (ref 4.0–10.5)

## 2016-05-13 LAB — RENAL FUNCTION PANEL
Albumin: 2 g/dL — ABNORMAL LOW (ref 3.5–5.0)
Anion gap: 9 (ref 5–15)
BUN: 17 mg/dL (ref 6–20)
CALCIUM: 7.8 mg/dL — AB (ref 8.9–10.3)
CHLORIDE: 110 mmol/L (ref 101–111)
CO2: 23 mmol/L (ref 22–32)
CREATININE: 1.96 mg/dL — AB (ref 0.44–1.00)
GFR, EST AFRICAN AMERICAN: 29 mL/min — AB (ref >60)
GFR, EST NON AFRICAN AMERICAN: 25 mL/min — AB (ref >60)
Glucose, Bld: 165 mg/dL — ABNORMAL HIGH (ref 65–99)
PHOSPHORUS: 2.1 mg/dL — AB (ref 2.5–4.6)
Potassium: 3.5 mmol/L (ref 3.5–5.1)
Sodium: 142 mmol/L (ref 135–145)

## 2016-05-13 LAB — GLUCOSE, CAPILLARY
GLUCOSE-CAPILLARY: 178 mg/dL — AB (ref 65–99)
GLUCOSE-CAPILLARY: 215 mg/dL — AB (ref 65–99)
Glucose-Capillary: 141 mg/dL — ABNORMAL HIGH (ref 65–99)
Glucose-Capillary: 156 mg/dL — ABNORMAL HIGH (ref 65–99)
Glucose-Capillary: 277 mg/dL — ABNORMAL HIGH (ref 65–99)
Glucose-Capillary: 90 mg/dL (ref 65–99)

## 2016-05-13 MED ORDER — K PHOS MONO-SOD PHOS DI & MONO 155-852-130 MG PO TABS
250.0000 mg | ORAL_TABLET | Freq: Two times a day (BID) | ORAL | Status: DC
  Administered 2016-05-13 – 2016-05-16 (×7): 250 mg via ORAL
  Filled 2016-05-13 (×8): qty 1

## 2016-05-13 MED ORDER — FUROSEMIDE 40 MG PO TABS
40.0000 mg | ORAL_TABLET | Freq: Every day | ORAL | Status: DC
  Administered 2016-05-13 – 2016-05-18 (×6): 40 mg via ORAL
  Filled 2016-05-13 (×6): qty 1

## 2016-05-13 MED ORDER — MECLIZINE HCL 25 MG PO TABS
25.0000 mg | ORAL_TABLET | Freq: Three times a day (TID) | ORAL | Status: DC | PRN
  Administered 2016-05-13 – 2016-05-16 (×9): 25 mg via ORAL
  Filled 2016-05-13 (×9): qty 1

## 2016-05-13 MED ORDER — HYDROCORTISONE 2.5 % RE CREA
TOPICAL_CREAM | Freq: Four times a day (QID) | RECTAL | Status: DC
  Administered 2016-05-13: 12:00:00 via RECTAL
  Administered 2016-05-13 (×2): 1 via RECTAL
  Administered 2016-05-13 – 2016-05-16 (×11): via RECTAL
  Filled 2016-05-13: qty 28.35

## 2016-05-13 NOTE — Progress Notes (Addendum)
Patient ID: Cassandra [name], female   DOB: 07-Jul-1946, 70 y.o.   MRN: [id number]   PROGRESS NOTE    Cassandra [name]  LTJ:[id number] DOB: 08-May-1946 DOA: 05/06/2016  PCP: Enid Derry, MD   Brief Narrative:  This is a 70 y.o. female with PMH of Type 1 Diabetes mellitus, Hypothyroidism, Hypertension, CKD stage 3 (GFR 30-59 ml/min), Tobacco use, GERD, elevated liver enzymes (Hep B/C neg 2014). She presented to the ER 05/06/2016 with altered mental status onset last night. Patient is also a chronic alcohol user, husband stated pt drinks everyday throughout the day for the past 10-15 years, last alcoholic beverage per husband was 05/05/2016.   PCCM consulted 05/06/2016 for management of acute hypoxic respiratory failure, acute encephalopathy and DKA  SIGNIFICANT EVENTS: 5/22 Pt arrived via EMS with AMS and elevated blood glucose level 5/22 Pt intubated in ED secondary to acute hypoxic respiratory distress 5/23 repletion of electrolytes  5/25 vomited 5/25 self extubation 5/27 [facility] took over from [facility]   Assessment & Plan: Acute hypoxic respiratory failure  in pt with underlying COPD and Tobacco abuse - Self extubated 5/25 - CT chest notable for small to moderate layering bilateral pleural effusions with associated compressive lower lobe atelectasis. Mild to moderate anasarca, patchy peribronchovascular and peripheral ground-glass opacity and consolidation throughout both lungs, most prominent in the right upper lobe - per CT chest, differential includes multilobar pneumonia, noncardiogenic pulmonary edema/ARDS or alveolar hemorrhage, vasculitis. - d/w PCCM  - ECHO pending, lasix started due to rales on exam and LE edema   Accelerated HTN - SBP in 130's this AM  - currently on Coreg - continue hydralazine as needed for better control   Acute renal failure on chronic kidney disease stage III (Baseline creatinine 1.1-1.5) - ? ATN likely mediated by DKA/sepsis - renal function improving   - BMP in AM   Diabetic ketoacidosis, hypokalemia, hypophosphatemia  - resolved, K is WNL  - good CBG control  - keep on SSI for now  Septic shock - unclear etiology, ?UTI - currently on Zosyn - Continue abx as listed above,dc vanco 5/25 - Trend Procalcitonin's . 69->2.10 - WBC is WNL, pt remains afebrile  Anemia of chronic disease - alcohol use and malnutrition - no signs of bleeding, Hg overall stable and at baseline  - CBC in AM  Chronic alcohol abuse - keep on CIWA, no signs of withdrawal   Diarrhea - ? ABX induced  - monitor  Vomiting  - resolved   Acute encephalopathy - Hx: Migraines, Bipolar Disorder, ETOH abuse, Benign paroxysmal positional vertigo - resolved   DVT prophylaxis: Heparin SQ Code Status: Full  Family Communication: Patient at bedside, daughter instructed to call my cell phone for update as she is currently at the beach and is unable to be here  Disposition Plan: IR when bed available   Consultants:   Nephrology   Procedures:   ETT>>5/22>>5/25 self extubation  Right IJ CVC>>5/22  R radial a-line 5/22>>>5/25  Antimicrobials:   Vancomycin 5/22>>5/25  Zosyn 5/22>   Subjective: Reports persistent dyspnea.   Objective: Filed Vitals:   05/12/16 1659 05/12/16 1950 05/13/16 0530 05/13/16 0628  BP: 150/70 137/70 189/82 136/58  Pulse: 90 77 84   Temp:  98.3 F (36.8 C) 98.3 F (36.8 C)   TempSrc:  Oral Oral   Resp:  20 20   Height:      Weight:   55.021 kg (121 lb 4.8 oz)   SpO2:  98% 98%  Intake/Output Summary (Last 24 hours) at 05/13/16 0716 Last data filed at 05/13/16 0600  Gross per 24 hour  Intake      0 ml  Output    975 ml  Net   -975 ml   Filed Weights   05/11/16 0445 05/12/16 0535 05/13/16 0530  Weight: 57.3 kg (126 lb 5.2 oz) 51.619 kg (113 lb 12.8 oz) 55.021 kg (121 lb 4.8 oz)    Examination:  General exam: Appears calm and comfortable  Respiratory system: rales bilaterally, diminished breath sounds at  bases  Cardiovascular system: S1 & S2 heard, RRR. No JVD, murmurs, rubs, gallops or clicks. +1 LE edema Gastrointestinal system: Abdomen is nondistended, soft and nontender.  Central nervous system: Alert and oriented. No focal neurological deficits. Extremities: Symmetric 5 x 5 power.   Data Reviewed: I have personally reviewed following labs and imaging studies  CBC:  Recent Labs Lab 05/06/16 1113  05/09/16 0413 05/10/16 0353 05/11/16 0537 05/12/16 0909 05/13/16 0220  WBC 22.8*  < > 10.4 10.9* 13.5* 9.1 8.6  NEUTROABS 18.6*  --   --   --   --   --   --   HGB 10.6*  < > 8.9* 8.8* 9.1* 8.6* 8.2*  HCT 34.8*  < > 26.2* 27.0* 27.7* 25.6* 25.3*  MCV 104.8*  < > 93.6 98.5 95.8 94.5 96.9  PLT 252  < > 146* 152 199 235 291  < > = values in this interval not displayed. Basic Metabolic Panel:  Recent Labs Lab 05/07/16 0500  05/08/16 0400  05/09/16 0413 05/10/16 0353 05/11/16 0537 05/12/16 0909 05/13/16 0220  NA 146*  < > 142  < > 140 137 137 141 142  K 2.7*  < > 3.6  < > 3.4* 4.9 3.1* 3.4* 3.5  CL 109  < > 108  < > 111 105 104 108 110  CO2 23  < > 24  < > 22 22 21* 24 23  GLUCOSE 122*  < > 131*  < > 48* 246* 181* 93 165*  BUN 36*  < > 32*  < > 29* 24* 22* 17 17  CREATININE 4.78*  < > 2.97*  < > 2.87* 2.62* 2.42* 1.93* 1.96*  CALCIUM 7.1*  < > 6.8*  < > 6.5* 6.7* 7.4* 8.0* 7.8*  MG 1.4*  --  2.5*  --  2.4 2.0 1.7  --   --   PHOS <1.0*  --  3.5  --  3.5 5.6* 2.8  --  2.1*  < > = values in this interval not displayed.  Liver Function Tests:  Recent Labs Lab 05/06/16 1113 05/08/16 0400 05/13/16 0220  AST 34  --   --   ALT 15  --   --   ALKPHOS 50  --   --   BILITOT 1.6*  --   --   PROT 5.6*  --   --   ALBUMIN 3.3* 1.8* 2.0*    Recent Labs Lab 05/10/16 0359  LIPASE 21  AMYLASE 55    Recent Labs Lab 05/06/16 1113  AMMONIA 61*   Coagulation Profile:  Recent Labs Lab 05/06/16 1113  INR 1.13   CBG:  Recent Labs Lab 05/12/16 1115 05/12/16 1626  05/12/16 1958 05/13/16 0003 05/13/16 0354  GLUCAP 194* 150* 119* 277* 90   Recent Results (from the past 240 hour(s))  Blood Cultures (routine x 2)     Status: None   Collection Time: 05/06/16 11:13 AM  Result Value Ref Range Status   Specimen Description BLOOD LEFT HAND  Final   Special Requests IN PEDIATRIC BOTTLE  2CC  Final   Culture NO GROWTH 5 DAYS  Final   Report Status 05/11/2016 FINAL  Final  Blood Cultures (routine x 2)     Status: None   Collection Time: 05/06/16 11:17 AM  Result Value Ref Range Status   Specimen Description BLOOD RIGHT HAND  Final   Special Requests IN PEDIATRIC BOTTLE 1CC  Final   Culture NO GROWTH 5 DAYS  Final   Report Status 05/11/2016 FINAL  Final  Urine culture     Status: Abnormal   Collection Time: 05/06/16 11:36 AM  Result Value Ref Range Status   Specimen Description URINE, CATHETERIZED  Final   Special Requests NONE  Final   Culture <10,000 COLONIES/mL INSIGNIFICANT GROWTH (A)  Final   Report Status 05/07/2016 FINAL  Final  MRSA PCR Screening     Status: None   Collection Time: 05/06/16  3:33 PM  Result Value Ref Range Status   MRSA by PCR NEGATIVE NEGATIVE Final    Comment:        The GeneXpert MRSA Assay (FDA approved for NASAL specimens only), is one component of a comprehensive MRSA colonization surveillance program. It is not intended to diagnose MRSA infection nor to guide or monitor treatment for MRSA infections.   C difficile quick scan w PCR reflex     Status: None   Collection Time: 05/11/16 12:02 PM  Result Value Ref Range Status   C Diff antigen NEGATIVE NEGATIVE Final   C Diff toxin NEGATIVE NEGATIVE Final   C Diff interpretation Negative for toxigenic C. difficile  Final      Radiology Studies: Ct Chest Wo Contrast 05/12/2016  Small to moderate layering bilateral pleural effusions with associated compressive lower lobe atelectasis. Mild to moderate anasarca. 2. Nonspecific patchy peribronchovascular and  peripheral ground-glass opacity and consolidation throughout both lungs, most prominent in the right upper lobe. Associated mild interlobular septal thickening. Differential includes multilobar pneumonia, noncardiogenic pulmonary edema/ARDS or alveolar hemorrhage such as due to vasculitis. 3. Two-vessel coronary atherosclerosis.     Scheduled Meds: . antiseptic oral rinse  7 mL Mouth Rinse QID  . carvedilol  12.5 mg Oral BID WC  . chlorhexidine gluconate (SAGE KIT)  15 mL Mouth Rinse BID  . escitalopram  10 mg Oral QHS  . folic acid  1 mg Oral Daily  . heparin  5,000 Units Subcutaneous Q8H  . insulin aspart  0-15 Units Subcutaneous Q4H  . levothyroxine  100 mcg Oral QAC breakfast  . pantoprazole  40 mg Oral BID  . piperacillin-tazobactam (ZOSYN)  IV  3.375 g Intravenous Q8H  . pneumococcal 23 valent vaccine  0.5 mL Intramuscular Tomorrow-1000  . potassium chloride  20 mEq Oral TID  . thiamine  100 mg Oral Daily   Continuous Infusions: . sodium chloride 10 mL/hr at 05/10/16 2013     LOS: 7 days    Time spent: 20 minutes    Faye Ramsay, MD [facility] Pager [phone]  If 7PM-7AM, please contact night-coverage [email or web address] Password [facility] 05/13/2016, 7:16 AM

## 2016-05-13 NOTE — Progress Notes (Signed)
Physical Therapy Treatment Patient Details Name: Cassandra [name] MRN: [id number] DOB: [birth year] Today's Date: 05/13/2016    History of Present Illness 70 y.o. female adm with septic shock, acute respiratory failure, DKA, and acute encephalopathy. Intubated 5/22-5/25. PMH of Type 1 DM, Hypothyroidism, HTN, CKD stage 3, Tobacco use, GERD, elevated liver enzymes (Hep B/C neg 2014), ETOH abuse/  On a good day pt is normally ambulatory and performs ADL's independently. Pt has been complaining of frequent migraines and vertigo for about the last year she is followed by ENT.    PT Comments    Somewhat more participatory today but still requires much encouragement. Is complaining of her vertigo and reports she was supposed to see physician recently. Will have a vestibular trained PT assess next session. At this point doubt she will tolerate CIR. Recommend SNF unless this changes.  Follow Up Recommendations  SNF     Equipment Recommendations  Other (comment) (TBA)    Recommendations for Other Services       Precautions / Restrictions Precautions Precautions: Fall Restrictions Weight Bearing Restrictions: No    Mobility  Bed Mobility Overal bed mobility: Needs Assistance Bed Mobility: Rolling;Sit to Sidelying;Sidelying to Sit Rolling: Supervision Sidelying to sit: Mod assist     Sit to sidelying: Min assist General bed mobility comments: Assist to elevate trunk into sitting and bring hips to EOB. Assist to bring legs back up into bed.  Transfers Overall transfer level: Needs assistance Equipment used: Rolling walker (2 wheeled);1 person hand held assist Transfers: Sit to/from UGI CorporationStand;Stand Pivot Transfers Sit to Stand: Mod assist;Min assist Stand pivot transfers: Min assist;Mod assist       General transfer comment: Initial sit to stand required mod A to bring hips up. Used bil forearm support to pivot ot BSC and mod A. Stood from bsc with min A  .  Ambulation/Gait Ambulation/Gait assistance: Min assist Ambulation Distance (Feet): 3 Feet Assistive device: Rolling walker (2 wheeled) Gait Pattern/deviations: Step-to pattern;Decreased step length - right;Decreased step length - left;Shuffle;Trunk flexed Gait velocity: decr Gait velocity interpretation: Below normal speed for age/gender General Gait Details: Assist for support and balance   Stairs            Wheelchair Mobility    Modified Rankin (Stroke Patients Only)       Balance Overall balance assessment: Needs assistance Sitting-balance support: No upper extremity supported;Feet supported Sitting balance-Leahy Scale: Fair     Standing balance support: Bilateral upper extremity supported Standing balance-Leahy Scale: Poor Standing balance comment: walker and min A for static standing                    Cognition Arousal/Alertness: Lethargic Behavior During Therapy: Anxious Overall Cognitive Status: No family/caregiver present to determine baseline cognitive functioning Area of Impairment: Following commands;Safety/judgement;Awareness;Problem solving;Orientation Orientation Level: Disoriented to;Place   Memory: Decreased short-term memory Following Commands: Follows one step commands consistently Safety/Judgement: Decreased awareness of safety Awareness: Intellectual Problem Solving: Difficulty sequencing;Requires verbal cues;Decreased initiation;Slow processing      Exercises      General Comments        Pertinent Vitals/Pain      Home Living                      Prior Function            PT Goals (current goals can now be found in the care plan section) Progress towards PT goals: Progressing toward goals  Frequency  Min 3X/week    PT Plan Discharge plan needs to be updated    Co-evaluation             End of Session   Activity Tolerance: Patient limited by fatigue (self limiting) Patient left: in  bed;with call bell/phone within reach;with bed alarm set     Time: 4010-2725 PT Time Calculation (min) (ACUTE ONLY): 33 min  Charges:  $Therapeutic Activity: 23-37 mins                    G Codes:      Marcel Gary 2016/05/30, 4:28 PM [facility] PT [phone]

## 2016-05-13 NOTE — Progress Notes (Signed)
Inpatient Diabetes Program Recommendations  AACE/ADA: New Consensus Statement on Inpatient Glycemic Control (2015)  Target Ranges:  Prepandial:   less than 140 mg/dL      Peak postprandial:   less than 180 mg/dL (1-2 hours)      Critically ill patients:  140 - 180 mg/dL   Review of Glycemic Control  Results for Cassandra JerseyMCFAYDEN, Terry A (MRN [id number]) as of 05/13/2016 15:22  Ref. Range 05/12/2016 00:01 05/12/2016 03:55 05/12/2016 09:13 05/12/2016 11:15 05/12/2016 16:26 05/12/2016 19:58 05/13/2016 00:03 05/13/2016 03:54 05/13/2016 07:59 05/13/2016 11:11  Glucose-Capillary Latest Ref Range: 65-99 mg/dL 086104 (H) 578207 (H) 88 469194 (H) 150 (H) 119 (H) 277 (H) 90 178 (H) 141 (H)   Received 11 units of Novolog on 5/28. Eating 20% on soft diet. Needs CHO mod med.  Inpatient Diabetes Program Recommendations:    Change Novolog to moderate tidwc and hs if po intake continues to improve. Add CHO mod med to soft diet.  Will continue to follow. Thank you. Ailene Ardshonda Tyrek Lawhorn, RD, LDN, CDE Inpatient Diabetes Coordinator [phone]

## 2016-05-13 NOTE — Progress Notes (Signed)
Subjective: Interval History: has complaints still SOB.  Objective: Vital signs in last 24 hours: Temp:  [98.3 F (36.8 C)-98.6 F (37 C)] 98.3 F (36.8 C) (05/29 0530) Pulse Rate:  [77-92] 84 (05/29 0530) Resp:  [20] 20 (05/29 0530) BP: (136-189)/(58-88) 136/58 mmHg (05/29 0628) SpO2:  [94 %-98 %] 98 % (05/29 0530) Weight:  [55.021 kg (121 lb 4.8 oz)] 55.021 kg (121 lb 4.8 oz) (05/29 0530) Weight change: 3.402 kg (7 lb 8 oz)  Intake/Output from previous day: 05/28 0701 - 05/29 0700 In: -  Out: 975 [Urine:975] Intake/Output this shift:    General appearance: alert, cooperative and pale Resp: diminished breath sounds bilaterally and rales bibasilar Cardio: S1, S2 normal and systolic murmur: holosystolic 2/6, blowing at apex GI: soft, pos bs Extremities: edema 1+  Lab Results:  Recent Labs  05/12/16 0909 05/13/16 0220  WBC 9.1 8.6  HGB 8.6* 8.2*  HCT 25.6* 25.3*  PLT 235 291   BMET:  Recent Labs  05/12/16 0909 05/13/16 0220  NA 141 142  K 3.4* 3.5  CL 108 110  CO2 24 23  GLUCOSE 93 165*  BUN 17 17  CREATININE 1.93* 1.96*  CALCIUM 8.0* 7.8*   No results for input(s): PTH in the last 72 hours. Iron Studies: No results for input(s): IRON, TIBC, TRANSFERRIN, FERRITIN in the last 72 hours.  Studies/Results: Ct Chest Wo Contrast  05/12/2016  CLINICAL DATA:  70 year old female inpatient with shortness of breath, diabetic ketoacidosis, altered mental status, acute respiratory failure. EXAM: CT CHEST WITHOUT CONTRAST TECHNIQUE: Multidetector CT imaging of the chest was performed following the standard protocol without IV contrast. COMPARISON:  05/10/2016 chest radiograph. No prior chest CT. 11/19/2011 CT abdomen. FINDINGS: Mediastinum/Nodes: Normal heart size. No pericardial fluid/thickening. Left anterior descending and right coronary atherosclerosis. Atherosclerotic nonaneurysmal thoracic aorta. Normal caliber pulmonary arteries. Atrophic appearing thyroid. Normal  esophagus. No pathologically enlarged axillary, mediastinal or gross hilar lymph nodes, noting limited sensitivity for the detection of hilar adenopathy on this noncontrast study. Lungs/Pleura: No pneumothorax. Small to moderate layering pleural effusions bilaterally. Compressive moderate left lower lobe and mild-to-moderate right lower lobe atelectasis. There is patchy ground-glass opacity and consolidation throughout both lungs, most prominent in the right upper lobe, in a peribronchovascular and peripheral distribution. There is associated mild interlobular septal thickening predominantly in the apical upper lobes. No significant solid pulmonary nodules in the aerated portions of the lungs. Upper abdomen: Unremarkable. Musculoskeletal: No aggressive appearing focal osseous lesions. Moderate degenerative changes in the thoracic spine. Partially visualized surgical hardware from ACDF overlies the lower cervical spine. Mild to moderate anasarca. IMPRESSION: 1. Small to moderate layering bilateral pleural effusions with associated compressive lower lobe atelectasis. Mild to moderate anasarca. 2. Nonspecific patchy peribronchovascular and peripheral ground-glass opacity and consolidation throughout both lungs, most prominent in the right upper lobe. Associated mild interlobular septal thickening. Differential includes multilobar pneumonia, noncardiogenic pulmonary edema/ARDS or alveolar hemorrhage such as due to vasculitis. 3. Two-vessel coronary atherosclerosis. Electronically Signed   By: Delbert PhenixJason A Poff M.D.   On: 05/12/2016 13:06    I have reviewed the patient's current medications.  Assessment/Plan: 1 AKI  Suspect some component CKD.  Vol xs, will add Lasix 2 HTN lower vol 3 Anemia may need esa soon 4 DKA controlled glu 5 Sepsis resolved 6 ETOH P Lasix, will s/o for now, and see again at your request.  Will need F/U with Dr. Allena KatzPatel after d/c    LOS: 7 days   Quilla Freeze L 05/13/2016,8:26  AM    

## 2016-05-13 NOTE — Progress Notes (Signed)
Pt had a brief run of SVT, strip saved, pt asymptomatic at this time.  RN will continue to monitor.  Erenest Rasheraldwell,Shiraz Bastyr B, RN

## 2016-05-13 NOTE — Progress Notes (Signed)
Thank you for consult on Cassandra [name]. She is currently refusing to participate in therapy and will need to show ability to participate/tolerate therapy. Will hold CIR consult for now.

## 2016-05-13 NOTE — Clinical Social Work Note (Signed)
Clinical Social Work Assessment  Patient Details  Name: Cassandra [name] MRN: [id number] Date of Birth: 05-Aug-1946  Date of referral:  05/13/16               Reason for consult:                   Permission sought to share information with:    Permission granted to share information::  Yes, Verbal Permission Granted  Name::     Tilmon,Charles  Agency::  SNFs  Relationship::  Husband  Contact Information:     Housing/Transportation Living arrangements for the past 2 months:  [place] of Information:  Patient, Spouse Patient Interpreter Needed:  None Criminal Activity/Legal Involvement Pertinent to Current Situation/Hospitalization:  No - Comment as needed Significant Relationships:  Spouse Lives with:  Spouse Do you feel safe going back to the place where you live?  Yes Need for family participation in patient care:  Yes (Comment)  Care giving concerns:  Patient is agreeable to short term rehab. Patient reported she would prefer inpatient rehab.    Social Worker assessment / plan:  CSW met with patient at beside to complete assessment. Patient was resting comfortably in bed. Patient's husband was also at bedside.  CSW explained PT recommendation for CIR placement. CSW explained that an alternate plan of SNFs placement may be needed.  CSW explained SNF search and placement process to the patient and patient's husband  and answered their questions. The patient and patient's husband requested the CSW pursue alternate plan of SNFs placement. CSW will follow up with bed offers.  Employment status:  Retired Forensic scientist:    PT Recommendations:  Inpatient [place] / Referral to community resources:  [place]  Patient/Family's Response to care: The patient appears happy with the care she is receiving in hospital and is appreciative of CSW assistance. Patient's husband expressed concern about not seeing patient's RN in a  hour. CSW informed patient's RN of patient's husband concern.    Patient/Family's Understanding of and Emotional Response to Diagnosis, Current Treatment, and Prognosis:  The patient has a good understanding of why she was admitted.  She understands the care plan and what she will need post discharge. Patient would prefer inpatient rehab, but accepted that an alternate plan of SNF may be needed.   Emotional Assessment Appearance:  Appears stated age Attitude/Demeanor/Rapport:   (Patient was welcoming of CSW and appropriate.) Affect (typically observed):  Accepting, Calm, Appropriate Orientation:  Oriented to Self, Oriented to Place, Oriented to  Time, Oriented to Situation Alcohol / Substance use:  Not Applicable Psych involvement (Current and /or in the community):  No (Comment)  Discharge Needs  Concerns to be addressed:  Discharge Planning Concerns Readmission within the last 30 days:  No Current discharge risk:  Physical Impairment Barriers to Discharge:  Continued Medical Work up   [employer], [place] 05/13/2016, 1:55 PM

## 2016-05-13 NOTE — Care Management Important Message (Signed)
Important Message  Patient Details  Name: Cassandra [name] MRN: [id number] Date of Birth: [birth year]   Medicare Important Message Given:  Yes    Oralia RudMegan P Uilani [place] 05/13/2016, 9:45 AM

## 2016-05-14 ENCOUNTER — Inpatient Hospital Stay (HOSPITAL_COMMUNITY): Payer: Medicare Other

## 2016-05-14 DIAGNOSIS — N189 Chronic kidney disease, unspecified: Secondary | ICD-10-CM

## 2016-05-14 DIAGNOSIS — R0600 Dyspnea, unspecified: Secondary | ICD-10-CM

## 2016-05-14 DIAGNOSIS — N179 Acute kidney failure, unspecified: Secondary | ICD-10-CM | POA: Diagnosis present

## 2016-05-14 LAB — CBC
HEMATOCRIT: 27.1 % — AB (ref 36.0–46.0)
Hemoglobin: 8.7 g/dL — ABNORMAL LOW (ref 12.0–15.0)
MCH: 31.1 pg (ref 26.0–34.0)
MCHC: 32.1 g/dL (ref 30.0–36.0)
MCV: 96.8 fL (ref 78.0–100.0)
Platelets: 315 10*3/uL (ref 150–400)
RBC: 2.8 MIL/uL — AB (ref 3.87–5.11)
RDW: 13.6 % (ref 11.5–15.5)
WBC: 7.3 10*3/uL (ref 4.0–10.5)

## 2016-05-14 LAB — RENAL FUNCTION PANEL
ALBUMIN: 2 g/dL — AB (ref 3.5–5.0)
ANION GAP: 13 (ref 5–15)
BUN: 18 mg/dL (ref 6–20)
CO2: 20 mmol/L — ABNORMAL LOW (ref 22–32)
Calcium: 7.9 mg/dL — ABNORMAL LOW (ref 8.9–10.3)
Chloride: 108 mmol/L (ref 101–111)
Creatinine, Ser: 1.86 mg/dL — ABNORMAL HIGH (ref 0.44–1.00)
GFR, EST AFRICAN AMERICAN: 31 mL/min — AB (ref >60)
GFR, EST NON AFRICAN AMERICAN: 27 mL/min — AB (ref >60)
Glucose, Bld: 153 mg/dL — ABNORMAL HIGH (ref 65–99)
PHOSPHORUS: 3.7 mg/dL (ref 2.5–4.6)
POTASSIUM: 3.8 mmol/L (ref 3.5–5.1)
Sodium: 141 mmol/L (ref 135–145)

## 2016-05-14 LAB — GLUCOSE, CAPILLARY
GLUCOSE-CAPILLARY: 117 mg/dL — AB (ref 65–99)
GLUCOSE-CAPILLARY: 118 mg/dL — AB (ref 65–99)
GLUCOSE-CAPILLARY: 229 mg/dL — AB (ref 65–99)
GLUCOSE-CAPILLARY: 294 mg/dL — AB (ref 65–99)
GLUCOSE-CAPILLARY: 40 mg/dL — AB (ref 65–99)
Glucose-Capillary: 45 mg/dL — ABNORMAL LOW (ref 65–99)
Glucose-Capillary: 76 mg/dL (ref 65–99)
Glucose-Capillary: 98 mg/dL (ref 65–99)

## 2016-05-14 LAB — ECHOCARDIOGRAM COMPLETE
HEIGHTINCHES: 65 in
Weight: 1984 oz

## 2016-05-14 LAB — MAGNESIUM: Magnesium: 1.7 mg/dL (ref 1.7–2.4)

## 2016-05-14 MED ORDER — PHENYLEPHRINE IN HARD FAT 0.25 % RE SUPP
1.0000 | Freq: Two times a day (BID) | RECTAL | Status: DC
  Administered 2016-05-14 (×2): 1 via RECTAL
  Filled 2016-05-14 (×7): qty 1

## 2016-05-14 MED ORDER — LOPERAMIDE HCL 2 MG PO CAPS
2.0000 mg | ORAL_CAPSULE | ORAL | Status: DC | PRN
  Administered 2016-05-14 – 2016-05-15 (×4): 2 mg via ORAL
  Filled 2016-05-14 (×5): qty 1

## 2016-05-14 MED ORDER — PHENYLEPHRINE IN HARD FAT 0.25 % RE SUPP
1.0000 | Freq: Two times a day (BID) | RECTAL | Status: DC
  Filled 2016-05-14: qty 1

## 2016-05-14 NOTE — Progress Notes (Signed)
Inpatient Diabetes Program Recommendations  AACE/ADA: New Consensus Statement on Inpatient Glycemic Control (2015)  Target Ranges:  Prepandial:   less than 140 mg/dL      Peak postprandial:   less than 180 mg/dL (1-2 hours)      Critically ill patients:  140 - 180 mg/dL  Results for [facility], [facility] A (MRN [id number]) as of 05/14/2016 10:31  Ref. Range 05/13/2016 11:11 05/13/2016 15:59 05/13/2016 20:32 05/13/2016 23:56 05/14/2016 04:33  Glucose-Capillary Latest Ref Range: 65-99 mg/dL 409141 (H) 811215 (H) 914156 (H) 117 (H) 229 (H)   Review of Glycemic Control  Diabetes history: DM1 Outpatient Diabetes medications: Lantus 10 units QAM, Humalog 3-10 units tidwc Current orders for Inpatient glycemic control: Novolog moderate Q4H  Inpatient Diabetes Program Recommendations:    Lantus 5 units QAM Novolog 3 units tidwc for meal coverage insulin when diet is advanced (hold if eats <50%).  Thank you, Billy FischerJudy E. Asaiah Scarber, RN, MSN, CDE Inpatient Glycemic Control Team Team Pager [phone] (8am-5pm) 05/14/2016 10:32 AM

## 2016-05-14 NOTE — Progress Notes (Signed)
Patient ID: Cassandra [name], female   DOB: 09-15-1946, 70 y.o.   MRN: [id number]   PROGRESS NOTE    Cassandra [name]  ZNB:[id number] DOB: [birth year] DOA: 05/06/2016  PCP: Cammy Copa, MD   Brief Narrative:  This is a 70 y.o. female with PMH of Type 1 Diabetes mellitus, Hypothyroidism, Hypertension, CKD stage 3 (GFR 30-59 ml/min), Tobacco use, GERD, elevated liver enzymes (Hep B/C neg 2014). She presented to the ER 05/06/2016 with altered mental status onset last night. Patient is also a chronic alcohol user, husband stated pt drinks everyday throughout the day for the past 10-15 years, last alcoholic beverage per husband was 05/05/2016.   PCCM consulted 05/06/2016 for management of acute hypoxic respiratory failure, acute encephalopathy and DKA  SIGNIFICANT EVENTS: 5/22 Pt arrived via EMS with AMS and elevated blood glucose level 5/22 Pt intubated in ED secondary to acute hypoxic respiratory distress 5/23 repletion of electrolytes  5/25 vomited 5/25 self extubation 5/27 [facility] took over from [facility]   Assessment & Plan: Acute hypoxic respiratory failure in pt with underlying COPD and Tobacco abuse - Self extubated 5/25 - CT chest notable for small to moderate layering bilateral pleural effusions with associated compressive lower lobe atelectasis. Mild to moderate anasarca, patchy peribronchovascular and peripheral ground-glass opacity and consolidation throughout both lungs, most prominent in the right upper lobe - per CT chest, differential includes multilobar pneumonia, noncardiogenic pulmonary edema/ARDS or alveolar hemorrhage, vasculitis. - d/w PCCM, recommend Lasix which was already started 5/29 - continue to monitor response to Lasix  - daily weights, strict I/O  Accelerated HTN - SBP in 150's this AM  - currently on Coreg - continue hydralazine as needed for better control   Hemorrhoids - wants prepH, says this helped in the past - order placed   Acute renal  failure on chronic kidney disease stage III (Baseline creatinine 1.1-1.5) - ? ATN likely mediated by DKA/sepsis - renal function improving, Cr trending down  - BMP in AM   Diabetic ketoacidosis, hypokalemia, hypophosphatemia  - resolved, K is WNL  - good CBG control  - keep on SSI for now  Septic shock - unclear etiology, ?UTI - currently on Zosyn, today is day #9 of ABX, will stop ABX today  - dc vanco 5/25 - WBC is WNL, pt remains afebrile  Anemia of chronic disease - alcohol use and malnutrition - no signs of bleeding, Hg overall stable and at baseline  - CBC in AM  Chronic alcohol abuse - keep on CIWA, no signs of withdrawal   Diarrhea - ? ABX induced  - stop Zosyn, C. Diff negative  - monitor  Vomiting  - resolved   Acute encephalopathy - Hx: Migraines, Bipolar Disorder, ETOH abuse, Benign paroxysmal positional vertigo - resolved   DVT prophylaxis: Heparin SQ Code Status: Full  Family Communication: Patient at bedside, daughter instructed to call my cell phone for update as she is currently at the beach and is unable to be here  Disposition Plan: SNF recommended but pt refused   Consultants:   Nephrology   Procedures:   ETT>>5/22>>5/25 self extubation  Right IJ CVC>>5/22  R radial a-line 5/22>>>5/25  Antimicrobials:   Vancomycin 5/22>>5/25  Zosyn 5/22>   Subjective: Reports persistent dyspnea but no longer at rest only with exertion.   Objective: Filed Vitals:   05/13/16 1956 05/14/16 0433 05/14/16 0610 05/14/16 1458  BP: 162/76 183/85 158/66 153/62  Pulse: 77 87  88  Temp: 97.8 F (36.6 C) 98.5  F (36.9 C)  98.7 F (37.1 C)  TempSrc: Oral Oral  Oral  Resp: _0 Height:      Weight:  56.246 kg (124 lb)    SpO2: 100% 100%  95%    Intake/Output Summary (Last 24 hours) at 05/14/16 1756 Last data filed at 05/14/16 1754  Gross per 24 hour  Intake    535 ml  Output    601 ml  Net    -66 ml   Filed Weights   05/12/16 0535  05/13/16 0530 05/14/16 0433  Weight: 51.619 kg (113 lb 12.8 oz) 55.021 kg (121 lb 4.8 oz) 56.246 kg (124 lb)    Examination:  General exam: Appears uncomfortable from hemorrhoids  Respiratory system: rales bilaterally, diminished breath sounds at bases  Cardiovascular system: S1 & S2 heard, RRR. No JVD, murmurs, rubs, gallops or clicks. +1 LE edema Gastrointestinal system: Abdomen is nondistended, soft and nontender.  Central nervous system: Alert and oriented. No focal neurological deficits.   Data Reviewed: I have personally reviewed following labs and imaging studies  CBC:  Recent Labs Lab 05/10/16 0353 05/11/16 0537 05/12/16 0909 05/13/16 0220 05/14/16 0310  WBC 10.9* 13.5* 9.1 8.6 7.3  HGB 8.8* 9.1* 8.6* 8.2* 8.7*  HCT 27.0* 27.7* 25.6* 25.3* 27.1*  MCV 98.5 95.8 94.5 96.9 96.8  PLT 152 199 235 291 242   Basic Metabolic Panel:  Recent Labs Lab 05/08/16 0400  05/09/16 0413 05/10/16 0353 05/11/16 0537 05/12/16 0909 05/13/16 0220 05/14/16 0310  NA 142  < > 140 137 137 141 142 141  K 3.6  < > 3.4* 4.9 3.1* 3.4* 3.5 3.8  CL 108  < > 111 105 104 108 110 108  CO2 24  < > 22 22 21* 24 23 20*  GLUCOSE 131*  < > 48* 246* 181* 93 165* 153*  BUN 32*  < > 29* 24* 22* _1 CREATININE 2.97*  < > 2.87* 2.62* 2.42* 1.93* 1.96* 1.86*  CALCIUM 6.8*  < > 6.5* 6.7* 7.4* 8.0* 7.8* 7.9*  MG 2.5*  --  2.4 2.0 1.7  --   --  1.7  PHOS 3.5  --  3.5 5.6* 2.8  --  2.1* 3.7  < > = values in this interval not displayed.  Liver Function Tests:  Recent Labs Lab 05/08/16 0400 05/13/16 0220 05/14/16 0310  ALBUMIN 1.8* 2.0* 2.0*    Recent Labs Lab 05/10/16 0359  LIPASE 21  AMYLASE 55   No results for input(s): AMMONIA in the last 168 hours. Coagulation Profile: No results for input(s): INR, PROTIME in the last 168 hours. CBG:  Recent Labs Lab 05/13/16 2356 05/14/16 0433 05/14/16 1155 05/14/16 1225 05/14/16 1742  GLUCAP 117* 229* 45* 76 294*   Recent Results  (from the past 240 hour(s))  Blood Cultures (routine x 2)     Status: None   Collection Time: 05/06/16 11:13 AM  Result Value Ref Range Status   Specimen Description BLOOD LEFT HAND  Final   Special Requests IN PEDIATRIC BOTTLE  2CC  Final   Culture NO GROWTH 5 DAYS  Final   Report Status 05/11/2016 FINAL  Final  Blood Cultures (routine x 2)     Status: None   Collection Time: 05/06/16 11:17 AM  Result Value Ref Range Status   Specimen Description BLOOD RIGHT HAND  Final   Special Requests IN PEDIATRIC BOTTLE 1CC  Final   Culture NO GROWTH 5 DAYS  Final   Report Status 05/11/2016 FINAL  Final  Urine culture     Status: Abnormal   Collection Time: 05/06/16 11:36 AM  Result Value Ref Range Status   Specimen Description URINE, CATHETERIZED  Final   Special Requests NONE  Final   Culture <10,000 COLONIES/mL INSIGNIFICANT GROWTH (A)  Final   Report Status 05/07/2016 FINAL  Final  MRSA PCR Screening     Status: None   Collection Time: 05/06/16  3:33 PM  Result Value Ref Range Status   MRSA by PCR NEGATIVE NEGATIVE Final    Comment:        The GeneXpert MRSA Assay (FDA approved for NASAL specimens only), is one component of a comprehensive MRSA colonization surveillance program. It is not intended to diagnose MRSA infection nor to guide or monitor treatment for MRSA infections.   C difficile quick scan w PCR reflex     Status: None   Collection Time: 05/11/16 12:02 PM  Result Value Ref Range Status   C Diff antigen NEGATIVE NEGATIVE Final   C Diff toxin NEGATIVE NEGATIVE Final   C Diff interpretation Negative for toxigenic C. difficile  Final      Radiology Studies: Ct Chest Wo Contrast 05/12/2016  Small to moderate layering bilateral pleural effusions with associated compressive lower lobe atelectasis. Mild to moderate anasarca. 2. Nonspecific patchy peribronchovascular and peripheral ground-glass opacity and consolidation throughout both lungs, most prominent in the right  upper lobe. Associated mild interlobular septal thickening. Differential includes multilobar pneumonia, noncardiogenic pulmonary edema/ARDS or alveolar hemorrhage such as due to vasculitis. 3. Two-vessel coronary atherosclerosis.     Scheduled Meds: . antiseptic oral rinse  7 mL Mouth Rinse QID  . carvedilol  12.5 mg Oral BID WC  . chlorhexidine gluconate (SAGE KIT)  15 mL Mouth Rinse BID  . escitalopram  10 mg Oral QHS  . folic acid  1 mg Oral Daily  . furosemide  40 mg Oral Daily  . heparin  5,000 Units Subcutaneous Q8H  . hydrocortisone   Rectal QID  . insulin aspart  0-15 Units Subcutaneous Q4H  . levothyroxine  100 mcg Oral QAC breakfast  . pantoprazole  40 mg Oral BID  . phenylephrine  1 suppository Rectal BID  . phosphorus  250 mg Oral BID  . pneumococcal 23 valent vaccine  0.5 mL Intramuscular Tomorrow-1000  . thiamine  100 mg Oral Daily   Continuous Infusions: . sodium chloride 10 mL/hr at 05/10/16 2013     LOS: 8 days    Time spent: 20 minutes    Faye Ramsay, MD [facility] Pager [phone]  If 7PM-7AM, please contact night-coverage [email or web address] Password [email or web address] 05/14/2016, 5:56 PM

## 2016-05-14 NOTE — Progress Notes (Signed)
Speech Language Pathology Treatment: Dysphagia  Patient Details Name: Cassandra [name] MRN: [id number] DOB: 1946-07-03 Today's Date: 05/14/2016 Time: [id number] SLP Time Calculation (min) (ACUTE ONLY): 9 min  Assessment / Plan / Recommendation Clinical Impression  Skilled treatment session focused on addressing dysphagia goals.  Patient reported feeling bad and declined PO; however, RN present and reported that while PO intake is limited large medications whole with thin liquids have been difficult for the patient to consume.  RN was administering medication and SLP provided skilled observation of patient consuming a small capsule whole with consecutive thin liquid straw sips with no overt s/s of aspiration.  Patient reports that her appetite is poor; however, recommend acute follow up x1 for skilled observation of solids textures with thin liquids prior to discharge from services.      HPI HPI: 70 y.o. female with PMH of Type 1 Diabetes mellitus, Hypothyroidism, Hypertension, CKD stage 3 (GFR 30-59 ml/min), Tobacco use, GERD, elevated liver enzymes (Hep B/C neg 2014). She presents to the ER 05/06/2016 with altered mental status onset last night. On a good day pt is normally ambulatory and performs ADL's independently. Pt has been complaining of frequent migraines and vertigo for about the last year she is followed by ENT. Husband endorses a similar episode of confusion about 8 years ago, which turned out to be caused by a blood sugar of over 1200. Patient is also a chronic alcohol user, husband states pt drinks everyday throughout the day for the past 10-15 years, last alcoholic beverage per husband was 05/05/2016. According to pts. husband pt began acting different around 5 or 6 pm 05/05/2016 with slight confusion. Pt tossed and mumbled throughout the night. On 05/06/2016 she had persistent confusion and the husband was unsure as to what he should do. Husband then received a call from the patient's ENT  physicians office notifying him that the patient missed an appointment this morning. When he told them what was going on, they advised him to call 911. Upon EMS arrival CBG reading "High," pts. last dose of insulin was Sunday morning 05/05/2016. PCCM consulted 05/06/2016 for management of acute hypoxic respiratory failure, acute encephalopathy and DKA      SLP Plan  Continue with current plan of care     Recommendations  Diet recommendations: Regular;Thin liquid Liquids provided via: Straw Medication Administration: Whole meds with liquid (split large pills in 1/2) Supervision: Patient able to self feed;Intermittent supervision to cue for compensatory strategies Compensations: Slow rate;Small sips/bites             Oral Care Recommendations: Oral care BID Follow up Recommendations: Other (comment) (likely none, acute follow up x1 to observe with solids ) Plan: Continue with current plan of care     GO               Charlane FerrettiMelissa Travis Purk, M.A., CCC-SLP [phone]  Maaz Spiering 05/14/2016, 10:45 AM

## 2016-05-14 NOTE — Progress Notes (Signed)
PT Cancellation Note  Patient Details Name: Cassandra [name] MRN: [id number] DOB: 05/05/1946   Cancelled Treatment:    Reason Eval/Treat Not Completed: Fatigue/lethargy limiting ability to participate; checked back on pt to see effectiveness of CRT.  Notes no improvements, but hasn't been up as went down for hear monitor.  Noted R horizontal nystagmus with rolling over to R side.  Will see tomorrow for R horizontal canal CRT.   Elray McgregorCynthia Shandie Bertz 05/14/2016, 4:29 PM  Sheran Lawlessyndi Drianna Chandran, [place]PT [phone] 05/14/2016

## 2016-05-14 NOTE — Progress Notes (Signed)
  Echocardiogram 2D Echocardiogram has been performed.  Cassandra [name] 05/14/2016, 1:15 PM

## 2016-05-14 NOTE — Progress Notes (Signed)
Pt blood sugar is 45. Pt given orange juice and lunch tray has arrived. Pt stated she doesn't feel well and hasn't been eating well. Will recheck blood sugar and monitor pt.

## 2016-05-14 NOTE — [place] FL2 (Signed)
[place] MEDICAID FL2 LEVEL OF CARE SCREENING TOOL     IDENTIFICATION  Patient Name: Cassandra [name] Birthdate: 1946-08-02 Sex: female Admission Date (Current Location): 05/06/2016  [facility] and [place] Number:  Herbalist and Address:  [facility], [place] [address], [place], [place] [address]      Provider Number: [id number]  Attending Physician Name and Address:  Theodis Blaze, MD  Relative Name and Phone Number:       Current Level of Care: Hospital Recommended Level of Care: [place] Prior Approval Number:    Date Approved/Denied:   PASRR Number:    Discharge Plan: SNF    Current Diagnoses: Patient Active Problem List   Diagnosis Date Noted  . Generalized anxiety disorder 05/10/2016  . DKA (diabetic ketoacidoses) ([place]) 05/06/2016  . Acute respiratory failure ([place]) 05/06/2016  . Acute respiratory failure with hypoxia ([place]) 05/06/2016  . Acute encephalopathy 05/06/2016  . Septic shock ([place])   . Acute respiratory failure with hypoxia and hypercapnia (HCC)   . 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 Depression   . Hypertension   . CKD (chronic kidney disease) stage 3, GFR 30-59 ml/min   . Hypothyroidism   . Vitamin B12 deficiency   . Benign paroxysmal positional vertigo   . Anxiety   . Allergic rhinitis   . Glaucoma   . Benign hypertension with CKD (chronic kidney disease) stage III   . Tobacco use   . Cervicalgia   . Elevated liver enzymes   . History of alcohol use     Orientation RESPIRATION BLADDER Height & Weight     Self, Time, Situation, Place  O2 (2 L/min) Indwelling catheter Weight: 124 lb (56.246 kg) Height:  5' 5" (165.1 cm)  BEHAVIORAL SYMPTOMS/MOOD NEUROLOGICAL BOWEL NUTRITION STATUS   (None)  (None) Incontinent  (soft )  AMBULATORY STATUS COMMUNICATION OF NEEDS Skin   Extensive Assist Verbally Normal                       Personal Care Assistance Level of  Assistance  Bathing, Feeding Bathing Assistance: Limited assistance Feeding assistance: Independent Dressing Assistance: Limited assistance     Functional Limitations Info  Sight, Hearing, Speech Sight Info: Adequate Hearing Info: Adequate Speech Info: Adequate    SPECIAL CARE FACTORS FREQUENCY                       Contractures Contractures Info: Not present    Additional Factors Info  Insulin Sliding Scale, Allergies Code Status Info: full Allergies Info: Ciprofloxacin, Codeine, Doxycycline, Hydrocodone, Omnicef Cefdinir   Insulin Sliding Scale Info: insulin aspart (novoLOG) injection 0-15 Units Dose: 0-15 Units Freq: Every 4 hours Route: [place]       Current Medications (05/14/2016):  This is the current hospital active medication list Current Facility-Administered Medications  Medication Dose Route Frequency Provider Last Rate Last Dose  . 0.9 %  sodium chloride infusion   Intravenous Continuous Rush Farmer, MD 10 mL/hr at 05/10/16 2013    . acetaminophen (TYLENOL) tablet 650 mg  650 mg Oral Q4H PRN Praveen Mannam, MD   650 mg at 05/14/16 0525  . antiseptic oral rinse solution (CORINZ)  7 mL Mouth Rinse QID Rush Farmer, MD   7 mL at 05/12/16 1600  . carvedilol (COREG) tablet 12.5 mg  12.5 mg Oral BID WC Elmarie Shiley, MD   12.5  mg at 05/14/16 0809  . chlorhexidine gluconate (SAGE KIT) (PERIDEX) 0.12 % solution 15 mL  15 mL Mouth Rinse BID Wesam G Yacoub, MD   15 mL at 05/12/16 2000  . escitalopram (LEXAPRO) tablet 10 mg  10 mg Oral QHS Janardhana Jonnalagadda, MD   10 mg at 05/13/16 2201  . folic acid (FOLVITE) tablet 1 mg  1 mg Oral Daily Iskra M Myers, MD   1 mg at 05/14/16 0810  . furosemide (LASIX) tablet 40 mg  40 mg Oral Daily James Deterding, MD   40 mg at 05/14/16 0809  . heparin injection 5,000 Units  5,000 Units Subcutaneous Q8H Dana G Blakeney, NP   5,000 Units at 05/14/16 0525  . hydrALAZINE (APRESOLINE) injection 10 mg  10 mg Intravenous Q6H PRN Thomas  Michael Callahan, NP   10 mg at 05/14/16 0525  . hydrocortisone (ANUSOL-HC) 2.5 % rectal cream   Rectal QID Iskra M Myers, MD      . insulin aspart (novoLOG) injection 0-15 Units  0-15 Units Subcutaneous Q4H William S Minor, NP   5 Units at 05/14/16 0809  . levothyroxine (SYNTHROID, LEVOTHROID) tablet 100 mcg  100 mcg Oral QAC breakfast Iskra M Myers, MD   100 mcg at 05/14/16 0609  . loperamide (IMODIUM) capsule 2 mg  2 mg Oral PRN Iskra M Myers, MD   2 mg at 05/14/16 1035  . LORazepam (ATIVAN) injection 0.5 mg  0.5 mg Intravenous Q6H PRN Elizabeth C Deterding, MD   0.5 mg at 05/13/16 2201  . meclizine (ANTIVERT) tablet 25 mg  25 mg Oral TID PRN Iskra M Myers, MD   25 mg at 05/14/16 0525  . ondansetron (ZOFRAN) injection 4 mg  4 mg Intravenous Q8H PRN Rakesh Alva V, MD   4 mg at 05/14/16 0636  . pantoprazole (PROTONIX) EC tablet 40 mg  40 mg Oral BID Crystal S Robertson, RPH   40 mg at 05/14/16 0809  . phenylephrine ((USE for PREPARATION-H)) 0.25 % suppository 1 suppository  1 suppository Rectal BID Iskra M Myers, MD      . phosphorus (K PHOS NEUTRAL) tablet 250 mg  250 mg Oral BID Iskra M Myers, MD   250 mg at 05/14/16 0810  . pneumococcal 23 valent vaccine (PNU-IMMUNE) injection 0.5 mL  0.5 mL Intramuscular Tomorrow-1000 Janardhana Jonnalagadda, MD   0.5 mL at 05/13/16 1000  . thiamine (VITAMIN B-1) tablet 100 mg  100 mg Oral Daily Iskra M Myers, MD   100 mg at 05/14/16 0810     Discharge Medications: Please see discharge summary for a list of discharge medications.  Relevant Imaging Results:  Relevant Lab Results:   Additional Information SSN:[id number]   A , LCSW     

## 2016-05-14 NOTE — Progress Notes (Signed)
Physical Therapy Treatment Patient Details Name: Cassandra [name] MRN: [id number] DOB: July 26, 1946 Today's Date: 05/14/2016    History of Present Illness 70 y.o. female adm with septic shock, acute respiratory failure, DKA, and acute encephalopathy. Intubated 5/22-5/25. PMH of Type 1 DM, Hypothyroidism, HTN, CKD stage 3, Tobacco use, GERD, elevated liver enzymes (Hep B/C neg 2014), ETOH abuse/  On a good day pt is normally ambulatory and performs ADL's independently. Pt has been complaining of frequent migraines and vertigo for about the last year she is followed by ENT.    PT Comments    Patient with positive signs of BPPV and attempted treatment; however was only able to likely reposition in horizontal canal.  Patient fatigued and in pain from hemorrhoids so will attempt to see later today as time allows.  Continue to recommend SNF level rehab at d/c.  Follow Up Recommendations  SNF     Equipment Recommendations  Other (comment) (TBA)    Recommendations for Other Services       Precautions / Restrictions Precautions Precautions: Fall Restrictions Weight Bearing Restrictions: No    Mobility  Bed Mobility Overal bed mobility: Needs Assistance   Rolling: Supervision Sidelying to sit: Mod assist     Sit to sidelying: Min assist General bed mobility comments: increased time, heavy rail use and assist to lift trunk  Transfers                    Ambulation/Gait                 Stairs            Wheelchair Mobility    Modified Rankin (Stroke Patients Only)       Balance Overall balance assessment: Needs assistance Sitting-balance support: Feet unsupported Sitting balance-Leahy Scale: Fair Sitting balance - Comments: no LOB sitting EOB       Standing balance comment:  NT this session, focus on vestibular                     Cognition Arousal/Alertness: Awake/alert Behavior During Therapy: Anxious Overall Cognitive Status: No  family/caregiver present to determine baseline cognitive functioning Area of Impairment: Memory;Problem solving;Following commands     Memory: Decreased short-term memory Following Commands: Follows one step commands with increased time;Follows one step commands consistently            Exercises      General Comments General comments (skin integrity, edema, etc.): Patient describes dizziness as woozy unless lying down or sitting up and then it is at its worst.  Reports has had this over the past year.  Feels pain today in L ear (achy), but no recent hearing changes.  Seeing ENT as outpatient and has undergone some BPPV treatments.  Supine head roll negative bilateral for symptoms or nystagmus.  L modified hall pike initially two visible rotations to L then no nystagmus; R modified hall pike positive for rotarty nystgmus lasting about 20 seconds.  Treated for R posterior canal BPPV with Eply canalith repositioning; however in last turn to L noted L rotary nystagmus for about 15 seconds (and did not see rotary nystagmus when initiating procedure with R hall pike); so treated for L posterior canal BPPV (esp since patient c/o L ear pain) with Eply CRT, but on last turn noted ageotropic horizontal nystagmus lasting about 30-40 seconds.  Patient fatigued and treatment ended at this point with assist for pt to return to sidelying for comfort.  Pertinent Vitals/Pain Pain Assessment: Faces Pain Score: 10-Worst pain ever Faces Pain Scale: Hurts whole lot Pain Location: hemorroids Pain Descriptors / Indicators: Sore Pain Intervention(s): Limited activity within patient's tolerance;Monitored during session    Home Living                      Prior Function            PT Goals (current goals can now be found in the care plan section) Progress towards PT goals: Not progressing toward goals - comment (due to vertigo resistant to CRT this session)    Frequency  Min 3X/week    PT  Plan Current plan remains appropriate    Co-evaluation             End of Session   Activity Tolerance: Patient limited by fatigue Patient left: with call bell/phone within reach;in bed;with bed alarm set     Time: 0920-1012 PT Time Calculation (min) (ACUTE ONLY): 52 min  Charges:  $Therapeutic Activity: 23-37 mins $Canalith Rep Proc: 8-22 mins                    G Codes:      Elray McgregorCynthia Stevan Eberwein 05/14/2016, 10:55 AM  Sheran Lawlessyndi Antone Summons, PT [phone] 05/14/2016

## 2016-05-14 NOTE — Care Management Note (Signed)
Case Management Note  Patient Details  Name: Merril AbbeBillie A Moroni MRN: [id number] Date of Birth: 09/29/1946  Subjective/Objective:     septic shock, acute respiratory failure, DKA, and acute encephalopathy               Action/Plan: Discharge Planning:  NCM spoke to pt and husband at bedside. Offered choice/provided HH list. Husband states he would like to discuss with pt's dtrs. Requesting RW for home. Contacted [facility] DME rep for RW for home.   PCP- Dr Henrine Screwsobert Thacker  Expected Discharge Date:                  Expected Discharge Plan:  Home w Home Health Services  In-House Referral:  Clinical Social Work  Discharge planning Services  CM Consult  Post Acute Care Choice:  Home Health Choice offered to:  Spouse  DME Arranged:  Dan HumphreysWalker rolling DME Agency:  [facility]  [facility]  [facility]:     Status of Service:  In process, will continue to follow  Medicare Important Message Given:  Yes Date Medicare IM Given:    Medicare IM give by:    Date Additional Medicare IM Given:    Additional Medicare Important Message give by:     If discussed at Long Length of Stay Meetings, dates discussed:    Additional Comments:  Elliot CousinShavis, Hind Chesler Ellen, RN 05/14/2016, 2:51 PM

## 2016-05-14 NOTE — Progress Notes (Signed)
Physical medicine rehabilitation consult requested chart reviewed. Patient united [employer] Medicare will not justify inpatient rehabilitation services for this diagnosis. Presently plan will be skilled nursing facility versus home. Hold on formal rehabilitation consult at this time

## 2016-05-14 NOTE — Clinical Social Work Note (Signed)
CSW presented bed offers to patient. Patient stated she was not interested in SNF for short term rehab. CSW called patient's husband to inform him of bed offers left at bedside with patient. CSW was unable to reach husband or leave voice message. CSW informed RNCM.  Valero EnergyShonny Moselle Rister, LCSW [phone]

## 2016-05-15 ENCOUNTER — Inpatient Hospital Stay (HOSPITAL_COMMUNITY): Payer: Medicare Other

## 2016-05-15 DIAGNOSIS — I6330 Cerebral infarction due to thrombosis of unspecified cerebral artery: Secondary | ICD-10-CM | POA: Diagnosis present

## 2016-05-15 DIAGNOSIS — G459 Transient cerebral ischemic attack, unspecified: Secondary | ICD-10-CM

## 2016-05-15 LAB — CBC
HCT: 27.9 % — ABNORMAL LOW (ref 36.0–46.0)
Hemoglobin: 9.1 g/dL — ABNORMAL LOW (ref 12.0–15.0)
MCH: 31.5 pg (ref 26.0–34.0)
MCHC: 32.6 g/dL (ref 30.0–36.0)
MCV: 96.5 fL (ref 78.0–100.0)
PLATELETS: 393 10*3/uL (ref 150–400)
RBC: 2.89 MIL/uL — ABNORMAL LOW (ref 3.87–5.11)
RDW: 13.8 % (ref 11.5–15.5)
WBC: 8.9 10*3/uL (ref 4.0–10.5)

## 2016-05-15 LAB — RENAL FUNCTION PANEL
ALBUMIN: 2.1 g/dL — AB (ref 3.5–5.0)
Anion gap: 16 — ABNORMAL HIGH (ref 5–15)
BUN: 18 mg/dL (ref 6–20)
CO2: 17 mmol/L — ABNORMAL LOW (ref 22–32)
CREATININE: 1.99 mg/dL — AB (ref 0.44–1.00)
Calcium: 8 mg/dL — ABNORMAL LOW (ref 8.9–10.3)
Chloride: 107 mmol/L (ref 101–111)
GFR calc Af Amer: 29 mL/min — ABNORMAL LOW (ref >60)
GFR, EST NON AFRICAN AMERICAN: 25 mL/min — AB (ref >60)
Glucose, Bld: 272 mg/dL — ABNORMAL HIGH (ref 65–99)
PHOSPHORUS: 4.3 mg/dL (ref 2.5–4.6)
Potassium: 4 mmol/L (ref 3.5–5.1)
Sodium: 140 mmol/L (ref 135–145)

## 2016-05-15 LAB — GLUCOSE, CAPILLARY
GLUCOSE-CAPILLARY: 310 mg/dL — AB (ref 65–99)
GLUCOSE-CAPILLARY: 171 mg/dL — AB (ref 65–99)
GLUCOSE-CAPILLARY: 127 mg/dL — AB (ref 65–99)
GLUCOSE-CAPILLARY: 136 mg/dL — AB (ref 65–99)
Glucose-Capillary: 265 mg/dL — ABNORMAL HIGH (ref 65–99)

## 2016-05-15 MED ORDER — DIATRIZOATE MEGLUMINE & SODIUM 66-10 % PO SOLN
ORAL | Status: AC
  Administered 2016-05-15: 14:00:00
  Filled 2016-05-15: qty 30

## 2016-05-15 MED ORDER — HYDRALAZINE HCL 10 MG PO TABS
10.0000 mg | ORAL_TABLET | Freq: Three times a day (TID) | ORAL | Status: DC
  Administered 2016-05-15 – 2016-05-18 (×8): 10 mg via ORAL
  Filled 2016-05-15 (×8): qty 1

## 2016-05-15 MED ORDER — INSULIN ASPART 100 UNIT/ML ~~LOC~~ SOLN: 3.0000 [IU] | Freq: Three times a day (TID) | SUBCUTANEOUS | Status: DC

## 2016-05-15 MED ORDER — INSULIN GLARGINE 100 UNIT/ML ~~LOC~~ SOLN
5.0000 [IU] | Freq: Every day | SUBCUTANEOUS | Status: DC
  Administered 2016-05-15 – 2016-05-18 (×4): 5 [IU] via SUBCUTANEOUS
  Filled 2016-05-15 (×5): qty 0.05

## 2016-05-15 MED ORDER — GLUCERNA SHAKE PO LIQD: 237.0000 mL | Freq: Three times a day (TID) | ORAL | Status: DC

## 2016-05-15 MED ORDER — INSULIN ASPART 100 UNIT/ML ~~LOC~~ SOLN
0.0000 [IU] | Freq: Three times a day (TID) | SUBCUTANEOUS | Status: DC
  Administered 2016-05-17: 5 [IU] via SUBCUTANEOUS
  Administered 2016-05-17 – 2016-05-18 (×2): 3 [IU] via SUBCUTANEOUS
  Administered 2016-05-18: 2 [IU] via SUBCUTANEOUS
  Administered 2016-05-18 – 2016-05-19 (×2): 3 [IU] via SUBCUTANEOUS
  Administered 2016-05-19: 11 [IU] via SUBCUTANEOUS

## 2016-05-15 MED ORDER — SACCHAROMYCES BOULARDII 250 MG PO CAPS
250.0000 mg | ORAL_CAPSULE | Freq: Two times a day (BID) | ORAL | Status: DC
  Administered 2016-05-15 – 2016-05-19 (×9): 250 mg via ORAL
  Filled 2016-05-15 (×9): qty 1

## 2016-05-15 NOTE — Progress Notes (Signed)
Patient ID: Cassandra [name], female   DOB: Feb 16, 1946, 70 y.o.   MRN: [id number]   PROGRESS NOTE    Cassandra [name]  TGG:[id number] DOB: 1946/06/30 DOA: 05/06/2016  PCP: Cammy Copa, MD   Brief Narrative:  This is a 70 y.o. female with PMH of Type 1 Diabetes mellitus, Hypothyroidism, Hypertension, CKD stage 3 (GFR 30-59 ml/min), Tobacco use, GERD, elevated liver enzymes (Hep B/C neg 2014). She presented to the ER 05/06/2016 with altered mental status onset last night. Patient is also a chronic alcohol user, husband stated pt drinks everyday throughout the day for the past 10-15 years, last alcoholic beverage per husband was 05/05/2016.   PCCM consulted 05/06/2016 for management of acute hypoxic respiratory failure, acute encephalopathy and DKA  SIGNIFICANT EVENTS: 5/22 Pt arrived via EMS with AMS and elevated blood glucose level 5/22 Pt intubated in ED secondary to acute hypoxic respiratory distress 5/23 repletion of electrolytes  5/25 vomited 5/25 self extubation 5/27 [facility] took over from [facility]   Assessment & Plan: Acute hypoxic respiratory failure in pt with underlying COPD and Tobacco abuse - Self extubated 5/25 - CT chest notable for small to moderate layering bilateral pleural effusions with associated compressive lower lobe atelectasis. Mild to moderate anasarca, patchy peribronchovascular and peripheral ground-glass opacity and consolidation throughout both lungs, most prominent in the right upper lobe - per CT chest, differential includes multilobar pneumonia, noncardiogenic pulmonary edema/ARDS or alveolar hemorrhage, vasculitis. - d/w PCCM, recommend Lasix which was already started 5/29 - continue to monitor response to Lasix - daily weights, strict I/O  Accelerated HTN - SBP in 170's this AM  - currently on Coreg, will add scheduled hydralazine  - continue hydralazine as needed for better control   Dizziness, unsteady gait - pt says meclizine is not working -  MRI brain requested to rule out stroke - neurology consulted   Diarrhea - unclear etiology - Zosyn was stopped 5/30 - GI team consulted for assistance, we appreciate help   Hemorrhoids - wants prepH, says this helped in the past - order placed   Acute renal failure on chronic kidney disease stage III (Baseline creatinine 1.1-1.5) - ? ATN likely mediated by DKA/sepsis - Cr up slightly in the past 24 hours - BMP in AM   Diabetic ketoacidosis, hypokalemia, hypophosphatemia  - resolved, K is WNL  - good CBG control  - keep on SSI for now  Septic shock - unclear etiology, ?UTI - completed 9 days of Zosyn  - dc vanco 5/25 - WBC is WNL, pt remains afebrile  Anemia of chronic disease - alcohol use and malnutrition - no signs of bleeding, Hg overall stable and at baseline  - CBC in AM  Chronic alcohol abuse - keep on CIWA, no signs of withdrawal    Acute encephalopathy - Hx: Migraines, Bipolar Disorder, ETOH abuse, Benign paroxysmal positional vertigo - resolved   DVT prophylaxis: Heparin SQ Code Status: Full  Family Communication: Patient at bedside, daughter instructed to call my cell phone for update as she is currently at the beach and is unable to be here  Disposition Plan: SNF recommended, pt says she is in agreement with plan   Consultants:   Nephrology   GI  Neurology   Procedures:   ETT>>5/22>>5/25 self extubation  Right IJ CVC>>5/22  R radial a-line 5/22>>>5/25  Antimicrobials:   Vancomycin 5/22>>5/25  Zosyn 5/22>5/30   Subjective: Reports dizziness and watery diarrhea.   Objective: Filed Vitals:   05/14/16 2235 05/15/16 7035  05/15/16 0449 05/15/16 0622  BP: 143/46 160/66  165/65  Pulse: 79 96  96  Temp:  98.8 F (37.1 C)    TempSrc:  Oral    Resp:  20    Height:      Weight:   57.425 kg (126 lb 9.6 oz)   SpO2:  95%      Intake/Output Summary (Last 24 hours) at 05/15/16 0655 Last data filed at 05/15/16 0215  Gross per 24 hour    Intake    535 ml  Output    703 ml  Net   -168 ml   Filed Weights   05/13/16 0530 05/14/16 0433 05/15/16 0449  Weight: 55.021 kg (121 lb 4.8 oz) 56.246 kg (124 lb) 57.425 kg (126 lb 9.6 oz)    Examination:  General exam: Appears tired, sleepy, NAD Respiratory system: rales bilaterally, diminished breath sounds at bases  Cardiovascular system: S1 & S2 heard, RRR. No JVD, murmurs, rubs, gallops or clicks. +1 LE edema Gastrointestinal system: Abdomen is nondistended, soft and nontender.  Central nervous system: Alert and oriented. Strength 5/5 in upper and lower extremities bilaterally    Data Reviewed: I have personally reviewed following labs and imaging studies  CBC:  Recent Labs Lab 05/11/16 0537 05/12/16 0909 05/13/16 0220 05/14/16 0310 05/15/16 0336  WBC 13.5* 9.1 8.6 7.3 8.9  HGB 9.1* 8.6* 8.2* 8.7* 9.1*  HCT 27.7* 25.6* 25.3* 27.1* 27.9*  MCV 95.8 94.5 96.9 96.8 96.5  PLT 199 235 291 315 431   Basic Metabolic Panel:  Recent Labs Lab 05/09/16 0413 05/10/16 0353 05/11/16 0537 05/12/16 0909 05/13/16 0220 05/14/16 0310 05/15/16 0336  NA 140 137 137 141 142 141 140  K 3.4* 4.9 3.1* 3.4* 3.5 3.8 4.0  CL 111 105 104 108 110 108 107  CO2 22 22 21* 24 23 20* 17*  GLUCOSE 48* 246* 181* 93 165* 153* 272*  BUN 29* 24* 22* _0 CREATININE 2.87* 2.62* 2.42* 1.93* 1.96* 1.86* 1.99*  CALCIUM 6.5* 6.7* 7.4* 8.0* 7.8* 7.9* 8.0*  MG 2.4 2.0 1.7  --   --  1.7  --   PHOS 3.5 5.6* 2.8  --  2.1* 3.7 4.3    Liver Function Tests:  Recent Labs Lab 05/13/16 0220 05/14/16 0310 05/15/16 0336  ALBUMIN 2.0* 2.0* 2.1*    Recent Labs Lab 05/10/16 0359  LIPASE 21  AMYLASE 55   CBG:  Recent Labs Lab 05/14/16 1225 05/14/16 1742 05/14/16 2102 05/14/16 2346 05/15/16 0425  GLUCAP 76 294* 98 118* 310*   Recent Results (from the past 240 hour(s))  Blood Cultures (routine x 2)     Status: None   Collection Time: 05/06/16 11:13 AM  Result Value Ref Range  Status   Specimen Description BLOOD LEFT HAND  Final   Special Requests IN PEDIATRIC BOTTLE  2CC  Final   Culture NO GROWTH 5 DAYS  Final   Report Status 05/11/2016 FINAL  Final  Blood Cultures (routine x 2)     Status: None   Collection Time: 05/06/16 11:17 AM  Result Value Ref Range Status   Specimen Description BLOOD RIGHT HAND  Final   Special Requests IN PEDIATRIC BOTTLE 1CC  Final   Culture NO GROWTH 5 DAYS  Final   Report Status 05/11/2016 FINAL  Final  Urine culture     Status: Abnormal   Collection Time: 05/06/16 11:36 AM  Result Value Ref Range Status   Specimen Description URINE,  CATHETERIZED  Final   Special Requests NONE  Final   Culture <10,000 COLONIES/mL INSIGNIFICANT GROWTH (A)  Final   Report Status 05/07/2016 FINAL  Final  MRSA PCR Screening     Status: None   Collection Time: 05/06/16  3:33 PM  Result Value Ref Range Status   MRSA by PCR NEGATIVE NEGATIVE Final    Comment:        The GeneXpert MRSA Assay (FDA approved for NASAL specimens only), is one component of a comprehensive MRSA colonization surveillance program. It is not intended to diagnose MRSA infection nor to guide or monitor treatment for MRSA infections.   C difficile quick scan w PCR reflex     Status: None   Collection Time: 05/11/16 12:02 PM  Result Value Ref Range Status   C Diff antigen NEGATIVE NEGATIVE Final   C Diff toxin NEGATIVE NEGATIVE Final   C Diff interpretation Negative for toxigenic C. difficile  Final      Radiology Studies: Ct Chest Wo Contrast 05/12/2016  Small to moderate layering bilateral pleural effusions with associated compressive lower lobe atelectasis. Mild to moderate anasarca. 2. Nonspecific patchy peribronchovascular and peripheral ground-glass opacity and consolidation throughout both lungs, most prominent in the right upper lobe. Associated mild interlobular septal thickening. Differential includes multilobar pneumonia, noncardiogenic pulmonary edema/ARDS  or alveolar hemorrhage such as due to vasculitis. 3. Two-vessel coronary atherosclerosis.     Scheduled Meds: . antiseptic oral rinse  7 mL Mouth Rinse QID  . carvedilol  12.5 mg Oral BID WC  . chlorhexidine gluconate (SAGE KIT)  15 mL Mouth Rinse BID  . escitalopram  10 mg Oral QHS  . folic acid  1 mg Oral Daily  . furosemide  40 mg Oral Daily  . heparin  5,000 Units Subcutaneous Q8H  . hydrocortisone   Rectal QID  . insulin aspart  0-15 Units Subcutaneous Q4H  . levothyroxine  100 mcg Oral QAC breakfast  . pantoprazole  40 mg Oral BID  . phenylephrine  1 suppository Rectal BID  . phosphorus  250 mg Oral BID  . pneumococcal 23 valent vaccine  0.5 mL Intramuscular Tomorrow-1000  . thiamine  100 mg Oral Daily   Continuous Infusions: . sodium chloride 10 mL/hr at 05/10/16 2013     LOS: 9 days    Time spent: 20 minutes    Faye Ramsay, MD [facility] Pager [phone]  If 7PM-7AM, please contact night-coverage [email or web address] Password [facility] 05/15/2016, 6:55 AM

## 2016-05-15 NOTE — Consult Note (Signed)
[facility] Consultation Note  Referring Provider: Dr. Rebecca Eaton [facility]) Primary Care Physician:  Cammy Copa, MD  Reason for Consultation:  diarrhea  HPI: Cassandra [name] is a 70 y.o. female admitted with DKA, sepsis-like syndrome (?UTI) and altered mental status.  Was placed on antibiotics.  Were asked to see re: diarrhea.  Chronic GI problems for years, principally abdominal pain and constipation and intermittent nausea and vomiting.  Has early satiety and history of vomiting up meals consumed several hours previously.  Since in hospital, and on antibiotics, had several loose non-bloody stools.  Has periumbilical intermittent post-prandial abdominal pain.  She is post-cholecystectomy. Has history of chronic heavy alcohol use.  Was told she had "diabetic stomach."  C. Diff here negative.  No prior problems with diarrhea until her admission about one week ago.   Past Medical History  Diagnosis Date  . Diabetes mellitus without complication ([place])   . Thyroid disease   . 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 Depression   . Hypertension   . CKD (chronic kidney disease) stage 3, GFR 30-59 ml/min   . Hypothyroidism   . Vitamin B12 deficiency   . Benign paroxysmal positional vertigo   . Broken shoulder   . Broken toes   . Broken finger   . Migraines   . GERD (gastroesophageal reflux disease)   . Anxiety   . Interstitial cystitis     bladder stretched every 9 months  . Allergic rhinitis   . Glaucoma   . Benign hypertension with CKD (chronic kidney disease) stage III   . Tobacco use   . Cervicalgia   . Elevated liver enzymes Hep B/C neg 2014  . Bipolar disorder ([place])   . History of alcohol use   . Psoriasis     Past Surgical History  Procedure Laterality Date  . Appendectomy    . Tonsillectomy    . Cholecystectomy    . Hernia repair    . Cataract extraction    . Abdominal hysterectomy      with oophorectomy  . Bladder  surgery    . Cervical disc surgery      Prior to Admission medications   Medication Sig Start Date End Date Taking? Authorizing Provider  carvedilol (COREG) 12.5 MG tablet Take 1 tablet (12.5 mg total) by mouth 2 (two) times daily with a meal. 02/12/16  Yes Skeet Latch, MD  cetirizine (ZYRTEC) 10 MG tablet Take 10 mg by mouth daily as needed for allergies.   Yes Historical Provider, MD  hydrochlorothiazide (HYDRODIURIL) 25 MG tablet Take 1 tablet (25 mg total) by mouth daily. 01/17/16  Yes Arnetha Courser, MD  insulin lispro (HUMALOG) 100 UNIT/ML injection Inject 3-10 Units into the skin 3 (three) times daily with meals. Sliding scale as needed   Yes Historical Provider, MD  lactobacillus acidophilus & bulgar (LACTINEX) chewable tablet Chew 1 tablet by mouth daily.   Yes Historical Provider, MD  LANTUS 100 UNIT/ML injection Inject 10 Units into the skin every morning.  06/12/15  Yes Historical Provider, MD  levothyroxine (SYNTHROID, LEVOTHROID) 100 MCG tablet Take 1 tablet by mouth daily. Take only on Mon, Wed, Friday. 08/22/15  Yes Historical Provider, MD  losartan (COZAAR) 100 MG tablet Take 1 tablet (100 mg total) by mouth daily. 01/17/16  Yes Arnetha Courser, MD  sertraline (ZOLOFT) 50 MG tablet Take 1.5 tablets (75 mg total) by mouth daily. 01/17/16  Yes Arnetha Courser, MD  cyanocobalamin (,  VITAMIN B-12,) 1000 MCG/ML injection Inject 1,000 mcg into the muscle every 30 (thirty) days.    Historical Provider, MD  LORazepam (ATIVAN) 0.5 MG tablet Take 0.5 mg by mouth every 8 (eight) hours as needed for anxiety.  11/16/15   Historical Provider, MD  meclizine (ANTIVERT) 25 MG tablet Take 1 tablet (25 mg total) by mouth 3 (three) times daily as needed for dizziness. 02/12/16   Skeet Latch, MD  sodium chloride (MURO 128) 5 % ophthalmic solution Reported on 01/17/2016    Historical Provider, MD    Current Facility-Administered Medications  Medication Dose Route Frequency Provider Last Rate Last Dose  .  0.9 %  sodium chloride infusion   Intravenous Continuous Rush Farmer, MD 10 mL/hr at 05/10/16 2013    . acetaminophen (TYLENOL) tablet 650 mg  650 mg Oral Q4H PRN Praveen Mannam, MD   650 mg at 05/14/16 1738  . antiseptic oral rinse solution (CORINZ)  7 mL Mouth Rinse QID Rush Farmer, MD   7 mL at 05/15/16 0443  . carvedilol (COREG) tablet 12.5 mg  12.5 mg Oral BID WC Elmarie Shiley, MD   12.5 mg at 05/15/16 0347  . chlorhexidine gluconate (SAGE KIT) (PERIDEX) 0.12 % solution 15 mL  15 mL Mouth Rinse BID Rush Farmer, MD   15 mL at 05/15/16 0747  . escitalopram (LEXAPRO) tablet 10 mg  10 mg Oral QHS Ambrose Finland, MD   10 mg at 05/14/16 2123  . folic acid (FOLVITE) tablet 1 mg  1 mg Oral Daily Theodis Blaze, MD   1 mg at 05/15/16 4259  . furosemide (LASIX) tablet 40 mg  40 mg Oral Daily Mauricia Area, MD   40 mg at 05/15/16 0956  . heparin injection 5,000 Units  5,000 Units Subcutaneous Q8H Awilda Bill, NP   5,000 Units at 05/15/16 5638  . hydrALAZINE (APRESOLINE) injection 10 mg  10 mg Intravenous Q6H PRN Dianne Dun, NP   10 mg at 05/14/16 2122  . hydrocortisone (ANUSOL-HC) 2.5 % rectal cream   Rectal QID Theodis Blaze, MD      . insulin aspart (novoLOG) injection 0-15 Units  0-15 Units Subcutaneous Q4H Grace Bushy Minor, NP   3 Units at 05/15/16 0749  . levothyroxine (SYNTHROID, LEVOTHROID) tablet 100 mcg  100 mcg Oral QAC breakfast Theodis Blaze, MD   100 mcg at 05/15/16 [phone]  . loperamide (IMODIUM) capsule 2 mg  2 mg Oral PRN Theodis Blaze, MD   2 mg at 05/15/16 0140  . LORazepam (ATIVAN) injection 0.5 mg  0.5 mg Intravenous Q6H PRN Colbert Coyer, MD   0.5 mg at 05/13/16 2201  . meclizine (ANTIVERT) tablet 25 mg  25 mg Oral TID PRN Theodis Blaze, MD   25 mg at 05/15/16 0747  . ondansetron (ZOFRAN) injection 4 mg  4 mg Intravenous Q8H PRN Rigoberto Noel, MD   4 mg at 05/15/16 0214  . pantoprazole (PROTONIX) EC tablet 40 mg  40 mg Oral BID Karren Cobble,  RPH   40 mg at 05/15/16 0956  . phenylephrine ((USE for PREPARATION-H)) 0.25 % suppository 1 suppository  1 suppository Rectal BID Theodis Blaze, MD   1 suppository at 05/14/16 2125  . phosphorus (K PHOS NEUTRAL) tablet 250 mg  250 mg Oral BID Theodis Blaze, MD   250 mg at 05/15/16 0956  . pneumococcal 23 valent vaccine (PNU-IMMUNE) injection 0.5 mL  0.5 mL  Intramuscular Tomorrow-1000 Ambrose Finland, MD   0.5 mL at 05/13/16 1000  . thiamine (VITAMIN B-1) tablet 100 mg  100 mg Oral Daily Theodis Blaze, MD   100 mg at 05/15/16 9381    Allergies as of 05/06/2016 - Review Complete 05/06/2016  Allergen Reaction Noted  . Ciprofloxacin Other (See Comments) 01/17/2016  . Codeine Diarrhea and Nausea And Vomiting 09/20/2015  . Doxycycline Diarrhea and Nausea And Vomiting 09/20/2015  . Hydrocodone  03/03/2015  . Omnicef [cefdinir] Nausea Only and Other (See Comments) 09/20/2015    Family History  Problem Relation Age of Onset  . Alcohol abuse Mother   . Arthritis Mother   . Asthma Mother   . Cancer Mother     colon cancer  . Hypertension Mother   . Migraines Mother   . Stroke Mother   . Lung disease Mother   . COPD Mother   . Diabetes Father   . Hypertension Father   . Heart disease Father   . Heart attack Father   . Heart disease Paternal Grandmother   . Diabetes Paternal Grandmother   . Stroke Paternal Grandmother   . Cancer Paternal Grandmother   . Diabetes Paternal Grandfather     Social History   Social History  . Marital Status: Married    Spouse Name: N/A  . Number of Children: N/A  . Years of Education: N/A   Occupational History  . Not on file.   Social History Main Topics  . Smoking status: Current Every Day Smoker -- 0.25 packs/day for 10 years    Types: Cigarettes  . Smokeless tobacco: Never Used  . Alcohol Use: 4.2 oz/week    7 Glasses of wine per week     Comment: at least one glass of wine a night  . Drug Use: No  . Sexual Activity: Not on file    Other Topics Concern  . Not on file   Social History Narrative   Patient has been married for 13 years and lives with spouse, has 2 children, 3 grandchildren, and 1 great grandchild. She does not use walker/cane, does not clean house, does not shop, does not do yard work, and does not drive. She is retired. Previous occupation was Product manager. Completed [employer].         Epwoth Sleepiness Scale Score:  7      --I have high blood pressure   --I have had insomnia   --I seem to be losing my sex drive   --I feel stressed and lack motivation   --I wake up to urinate frequently at night   --I wake up with a dry mouth or sore throat   --I feel excessively sleepy and tired throughout the day   --I have Diabetes   --I have been told that I snore   --I have problems with memory/concentration   --I frequently awake with headaches        Review of Systems: Positive = bold Gen: Denies any fever, chills, rigors, night sweats, anorexia, fatigue, weakness, malaise, involuntary weight loss, and sleep disorder CV: Denies chest pain, angina, palpitations, syncope, orthopnea, PND, peripheral edema, and claudication. Resp: Denies dyspnea, cough, sputum, wheezing, coughing up blood. GI: Described in detail in HPI.    GU : Denies urinary burning, blood in urine, urinary frequency, urinary hesitancy, nocturnal urination, and urinary incontinence. MS: Denies joint pain or swelling.  Denies muscle weakness, cramps, atrophy.  Derm: Denies rash, itching, oral ulcerations, hives,  unhealing ulcers.  Psych: Denies depression, anxiety, memory loss, suicidal ideation, hallucinations,  and confusion. Heme: Denies bruising, bleeding, and enlarged lymph nodes. Neuro:  Denies any headaches, dizziness, paresthesias. Endo:  Denies any problems with DM, thyroid, adrenal function.  Physical Exam: Vital signs in last 24 hours: Temp:  [97.7 F (36.5 C)-98.8 F (37.1 C)] 98.8 F (37.1 C) (05/31  0444) Pulse Rate:  [79-96] 96 (05/31 0622) Resp:  [20] 20 (05/31 0444) BP: (143-171)/(46-68) 165/65 mmHg (05/31 0622) SpO2:  [93 %-95 %] 95 % (05/31 0444) Weight:  [57.425 kg (126 lb 9.6 oz)] 57.425 kg (126 lb 9.6 oz) (05/31 0449) Last BM Date: 05/14/16 General:   Alert, much older-appearing than stated age, chronically ill-appearing but is in NAD Head:  Normocephalic and atraumatic. Eyes:  Sclera clear, no icterus.   Conjunctiva pink. Ears:  Normal auditory acuity. Nose:  No deformity, discharge,  or lesions. Mouth:  No deformity or lesions.  Oropharynx pink & moist. Neck:  Supple; no masses or thyromegaly. Lungs:  Clear throughout to auscultation.  Scattered mild post-expiratory wheezes.  No crackles, or rhonchi. No acute distress. Heart:  Regular rate and rhythm; no murmurs, clicks, rubs,  or gallops. Abdomen:  Soft,mild generalized tenderness, no peritonitis, no distention, No masses, hepatosplenomegaly or hernias noted. Normal bowel sounds, without guarding, and without rebound.     Msk:  Diffuse muscular atrophy but symmetrical without gross deformities. Normal posture. Pulses:  Normal pulses noted. Extremities:  Without clubbing or edema. Neurologic:  Alert and  oriented x4;  grossly normal neurologically. Skin:  Multiple bruises; otherwise Intact without significant lesions or rashes. Psych:  Alert and cooperative. Depressed mood, flat affect   Lab Results:  Recent Labs  05/13/16 0220 05/14/16 0310 05/15/16 0336  WBC 8.6 7.3 8.9  HGB 8.2* 8.7* 9.1*  HCT 25.3* 27.1* 27.9*  PLT 291 315 393   BMET  Recent Labs  05/13/16 0220 05/14/16 0310 05/15/16 0336  NA 142 141 140  K 3.5 3.8 4.0  CL 110 108 107  CO2 23 20* 17*  GLUCOSE 165* 153* 272*  BUN '17 18 18  '$ CREATININE 1.96* 1.86* 1.99*  CALCIUM 7.8* 7.9* 8.0*   LFT  Recent Labs  05/15/16 0336  ALBUMIN 2.1*   PT/INR No results for input(s): LABPROT, INR in the last 72 hours.  Studies/Results: No results  found.  Impression:  1.  Diarrhea.  New onset since hospitalization.  C. Diff negative.  Might be antibiotic-associated diarrhea.  Might be some other sort of GI infection.  Patient is at risk for pancreatic insufficiency given heavy alcohol history, but does not have prior diarrheal symptoms to support this. 2.  Nausea and vomiting.  Recurrent problem for years.  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 3.  Abdominal pain, epigastric post-prandial.  Prior cholecystectomy. 4.  Chronic alcohol abuse. 5.  Altered mental status, DKA, sepsis-like syndrome possibly.  Improving.  Plan:  1.  Small, frequent meals.  Soft diet, avoiding fibrous breads, raw fruits/vegetables, rich/greasy foods. 2.  Antiemetics as needed.  Would not use metoclopramide at this time, given patient's recent altered mental status. 3.  Start probiotics (Florastor 250 mg po bid). 4.  GI pathogen stool panel. 5.  Continue PPI. Check CMP and lipase. 6.  [facility] will follow.  If GI pathogen panel is negative, and diarrhea persists over the next couple days despite above changes, consider sigmoidoscopy.    LOS: 9 days   Lanore Renderos M  05/15/2016, 11:51 AM  Pager [phone] If  no answer or after 5 PM call [phone]

## 2016-05-15 NOTE — Progress Notes (Signed)
Nutrition Follow Up  DOCUMENTATION CODES:   Not applicable  INTERVENTION:   Glucerna Shake po TID, each supplement provides 220 kcal and 10 grams of protein  NUTRITION DIAGNOSIS:   Inadequate oral intake now related to poor appetite as evidenced by meal completion < 25%, ongoing  GOAL:   Patient will meet greater than or equal to 90% of their needs, currently unmet  MONITOR:   PO intake, Supplement acceptance, Labs, Weight trends, I & O's  ASSESSMENT:   70 y.o. Female with PMH of Type 1 Diabetes mellitus, hypothyroidism, HTN, CKD stage 3 (GFR 30-59 ml/min), Tobacco use, GERD, elevated liver enzymes (Hep B/C neg 2014). She presents to the ER 05/06/2016 with altered mental status onset last night.PCCM consulted 05/06/2016 for management of acute hypoxic respiratory failure, acute encephalopathy and DKA.  Pt extubated 5/25. Glucerna 1.2 formula D/C'd 5/26. S/p bedside swallow evaluation 5/27. Diet advanced to Soft >> PO intake poor at 0-20% per flowsheet records. GI note 5/31 reviewed >> started on Florastor. Would benefit from oral nutrition supplements >> will order.  Diet Order:  DIET SOFT Room service appropriate?: Yes; Fluid consistency:: Thin  Skin:  Reviewed, no issues  Last BM:  5/30  Height:   Ht Readings from Last 1 Encounters:  05/06/16 5\' 5"  (1.651 m)    Weight:   Wt Readings from Last 1 Encounters:  05/15/16 126 lb 9.6 oz (57.425 kg)    Ideal Body Weight:  56.8 kg  BMI:  Body mass index is 21.07 kg/(m^2).  Estimated Nutritional Needs:   Kcal:  1500-1700  Protein:  70-80 gm  Fluid:  1.5-1.7 L  EDUCATION NEEDS:   No education needs identified at this time  Maureen ChattersKatie Chelsia Serres, RD, LDN Pager #: [phone] After-Hours Pager #: [phone]

## 2016-05-15 NOTE — Care Management Important Message (Signed)
Important Message  Patient Details  Name: Cassandra [name] MRN: [id number] Date of Birth: 07-Sep-1946   Medicare Important Message Given:  Yes    Bernadette HoitShoffner, Ellerie Arenz Coleman 05/15/2016, 9:58 AM

## 2016-05-15 NOTE — Consult Note (Signed)
Admission H&P    Chief Complaint: Recurrent spells of vertigo and nausea.  HPI: Cassandra [name] is an 70 y.o. female history diabetes mellitus, hypertension, hypothyroidism, chronic kidney disease, bipolar disorder and alcohol abuse admitted on 05/06/2016 for altered mental status as well as respiratory distress. Patient extubated herself on 04/09/2016 and has remained extubated. Mental status has improved and apparently is back to baseline. She gives a history of episodes of severe vertigo with associated nausea and vomiting for about one year. She has experienced several episodes since admission, the latest of which was yesterday. She's been taking meclizine which helps to some extent. MRI of her brain was obtained which showed acute left occipital pole infarction as well as punctate acute to subacute infarctions involving right hippocampus and thalamus, all involving posterior circulation and possibly due to emboli of arterial source. Patient has had no visual changes. She has not experienced any focal abnormalities involving the extremities nor change in speech. No facial droop has been noted.  LSN: Unclear tPA Given: No: Unclear when last known well mRankin:  Past Medical History  Diagnosis Date  . Diabetes mellitus without complication ([place])   . Thyroid disease   . 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 Depression   . Hypertension   . CKD (chronic kidney disease) stage 3, GFR 30-59 ml/min   . Hypothyroidism   . Vitamin B12 deficiency   . Benign paroxysmal positional vertigo   . Broken shoulder   . Broken toes   . Broken finger   . Migraines   . GERD (gastroesophageal reflux disease)   . Anxiety   . Interstitial cystitis     bladder stretched every 9 months  . Allergic rhinitis   . Glaucoma   . Benign hypertension with CKD (chronic kidney disease) stage III   . Tobacco use   . Cervicalgia   . Elevated liver enzymes Hep B/C neg 2014  . Bipolar disorder  ([place])   . History of alcohol use   . Psoriasis     Past Surgical History  Procedure Laterality Date  . Appendectomy    . Tonsillectomy    . Cholecystectomy    . Hernia repair    . Cataract extraction    . Abdominal hysterectomy      with oophorectomy  . Bladder surgery    . Cervical disc surgery      Family History  Problem Relation Age of Onset  . Alcohol abuse Mother   . Arthritis Mother   . Asthma Mother   . Cancer Mother     colon cancer  . Hypertension Mother   . Migraines Mother   . Stroke Mother   . Lung disease Mother   . COPD Mother   . Diabetes Father   . Hypertension Father   . Heart disease Father   . Heart attack Father   . Heart disease Paternal Grandmother   . Diabetes Paternal Grandmother   . Stroke Paternal Grandmother   . Cancer Paternal Grandmother   . Diabetes Paternal Grandfather    Social History:  reports that she has been smoking Cigarettes.  She has a 2.5 pack-year smoking history. She has never used smokeless tobacco. She reports that she drinks about 4.2 oz of alcohol per week. She reports that she does not use illicit drugs.  Allergies:  Allergies  Allergen Reactions  . Ciprofloxacin Other (See Comments)    No reaction specified  . Codeine Diarrhea and Nausea  And Vomiting  . Doxycycline Diarrhea and Nausea And Vomiting  . Hydrocodone   . Omnicef [Cefdinir] Nausea Only and Other (See Comments)    Constipation, tolerated Zosyn    Medications Prior to Admission  Medication Sig Dispense Refill  . carvedilol (COREG) 12.5 MG tablet Take 1 tablet (12.5 mg total) by mouth 2 (two) times daily with a meal. 60 tablet 11  . cetirizine (ZYRTEC) 10 MG tablet Take 10 mg by mouth daily as needed for allergies.    . hydrochlorothiazide (HYDRODIURIL) 25 MG tablet Take 1 tablet (25 mg total) by mouth daily. 30 tablet 5  . insulin lispro (HUMALOG) 100 UNIT/ML injection Inject 3-10 Units into the skin 3 (three) times daily with meals. Sliding scale as  needed    . lactobacillus acidophilus & bulgar (LACTINEX) chewable tablet Chew 1 tablet by mouth daily.    Marland Kitchen LANTUS 100 UNIT/ML injection Inject 10 Units into the skin every morning.   5  . levothyroxine (SYNTHROID, LEVOTHROID) 100 MCG tablet Take 1 tablet by mouth daily. Take only on Mon, Wed, Friday.    . losartan (COZAAR) 100 MG tablet Take 1 tablet (100 mg total) by mouth daily. 30 tablet 3  . sertraline (ZOLOFT) 50 MG tablet Take 1.5 tablets (75 mg total) by mouth daily. 45 tablet 5  . cyanocobalamin (,VITAMIN B-12,) 1000 MCG/ML injection Inject 1,000 mcg into the muscle every 30 (thirty) days.    Marland Kitchen LORazepam (ATIVAN) 0.5 MG tablet Take 0.5 mg by mouth every 8 (eight) hours as needed for anxiety.   2  . meclizine (ANTIVERT) 25 MG tablet Take 1 tablet (25 mg total) by mouth 3 (three) times daily as needed for dizziness. 30 tablet 0  . sodium chloride (MURO 128) 5 % ophthalmic solution Reported on 01/17/2016      ROS: History obtained from chart review and the patient  General ROS: As noted in present illness Psychological ROS: negative for - behavioral disorder, hallucinations, memory difficulties, mood swings or suicidal ideation Ophthalmic ROS: negative for - blurry vision, double vision, eye pain or loss of vision ENT ROS: negative for - epistaxis, nasal discharge, oral lesions, sore throat, tinnitus or vertigo Allergy and Immunology ROS: negative for - hives or itchy/watery eyes Hematological and Lymphatic ROS: negative for - bleeding problems, bruising or swollen lymph nodes Endocrine ROS: negative for - galactorrhea, hair pattern changes, polydipsia/polyuria or temperature intolerance Respiratory ROS: As noted in present illness Cardiovascular ROS: negative for - chest pain, dyspnea on exertion, edema or irregular heartbeat Gastrointestinal ROS: Intractable diarrhea over the past couple days Genito-Urinary ROS: negative for - dysuria, hematuria, incontinence or urinary  frequency/urgency Musculoskeletal ROS: negative for - joint swelling or muscular weakness Neurological ROS: as noted in HPI Dermatological ROS: negative for rash and skin lesion changes  Physical Examination: Blood pressure 149/60, pulse 77, temperature 98.6 F (37 C), temperature source Oral, resp. rate 17, height '5\' 5"'$  (1.651 m), weight 57.425 kg (126 lb 9.6 oz), SpO2 93 %.  HEENT-  Normocephalic, no lesions, without obvious abnormality.  Normal external eye and conjunctiva.  Normal TM's bilaterally.  Normal auditory canals and external ears. Normal external nose, mucus membranes and septum.  Normal pharynx. Neck supple with no masses, nodes, nodules or enlargement. Cardiovascular - regular rate and rhythm, S1, S2 normal, no murmur, click, rub or gallop Lungs - diminished breath sounds at bases; rales bilaterally Abdomen - soft, non-tender; bowel sounds normal; no masses,  no organomegaly Extremities - no joint deformities,  effusion, or inflammation  Neurologic Examination: Mental Status: Alert, oriented, no acute distress.  Speech fluent without evidence of aphasia. Able to follow commands without difficulty. Cranial Nerves: II-Visual fields were normal. III/IV/VI-Pupils were equal and reacted normally to light. Extraocular movements were full and conjugate.    V/VII-no facial numbness and no facial weakness. VIII-normal. X-normal speech. XI: trapezius strength/neck flexion strength normal bilaterally XII-midline tongue extension with normal strength. Motor: 5/5 bilaterally with normal tone and bulk Sensory: Normal throughout. Deep Tendon Reflexes: 1+ and symmetric. Plantars: Flexor bilaterally Cerebellar: Normal finger-to-nose testing. Carotid auscultation: Normal  Results for orders placed or performed during the hospital encounter of 05/06/16 (from the past 48 hour(s))  Glucose, capillary     Status: Abnormal   Collection Time: 05/13/16  8:32 PM  Result Value Ref Range    Glucose-Capillary 156 (H) 65 - 99 mg/dL  Glucose, capillary     Status: Abnormal   Collection Time: 05/13/16 11:56 PM  Result Value Ref Range   Glucose-Capillary 117 (H) 65 - 99 mg/dL  Renal function panel     Status: Abnormal   Collection Time: 05/14/16  3:10 AM  Result Value Ref Range   Sodium 141 135 - 145 mmol/L   Potassium 3.8 3.5 - 5.1 mmol/L   Chloride 108 101 - 111 mmol/L   CO2 20 (L) 22 - 32 mmol/L   Glucose, Bld 153 (H) 65 - 99 mg/dL   BUN 18 6 - 20 mg/dL   Creatinine, Ser 1.86 (H) 0.44 - 1.00 mg/dL   Calcium 7.9 (L) 8.9 - 10.3 mg/dL   Phosphorus 3.7 2.5 - 4.6 mg/dL   Albumin 2.0 (L) 3.5 - 5.0 g/dL   GFR calc non Af Amer 27 (L) >60 mL/min   GFR calc Af Amer 31 (L) >60 mL/min    Comment: (NOTE) The eGFR has been calculated using the CKD EPI equation. This calculation has not been validated in all clinical situations. eGFR's persistently <60 mL/min signify possible Chronic Kidney Disease.    Anion gap 13 5 - 15  CBC     Status: Abnormal   Collection Time: 05/14/16  3:10 AM  Result Value Ref Range   WBC 7.3 4.0 - 10.5 K/uL   RBC 2.80 (L) 3.87 - 5.11 MIL/uL   Hemoglobin 8.7 (L) 12.0 - 15.0 g/dL   HCT 27.1 (L) 36.0 - 46.0 %   MCV 96.8 78.0 - 100.0 fL   MCH 31.1 26.0 - 34.0 pg   MCHC 32.1 30.0 - 36.0 g/dL   RDW 13.6 11.5 - 15.5 %   Platelets 315 150 - 400 K/uL  Magnesium     Status: None   Collection Time: 05/14/16  3:10 AM  Result Value Ref Range   Magnesium 1.7 1.7 - 2.4 mg/dL  Glucose, capillary     Status: Abnormal   Collection Time: 05/14/16  4:33 AM  Result Value Ref Range   Glucose-Capillary 229 (H) 65 - 99 mg/dL  Glucose, capillary     Status: Abnormal   Collection Time: 05/14/16 11:55 AM  Result Value Ref Range   Glucose-Capillary 45 (L) 65 - 99 mg/dL  Glucose, capillary     Status: None   Collection Time: 05/14/16 12:25 PM  Result Value Ref Range   Glucose-Capillary 76 65 - 99 mg/dL  Glucose, capillary     Status: Abnormal   Collection Time:  05/14/16  5:42 PM  Result Value Ref Range   Glucose-Capillary 294 (H) 65 - 99 mg/dL  Glucose, capillary     Status: None   Collection Time: 05/14/16  9:02 PM  Result Value Ref Range   Glucose-Capillary 98 65 - 99 mg/dL  Glucose, capillary     Status: Abnormal   Collection Time: 05/14/16 11:46 PM  Result Value Ref Range   Glucose-Capillary 118 (H) 65 - 99 mg/dL  Renal function panel     Status: Abnormal   Collection Time: 05/15/16  3:36 AM  Result Value Ref Range   Sodium 140 135 - 145 mmol/L   Potassium 4.0 3.5 - 5.1 mmol/L   Chloride 107 101 - 111 mmol/L   CO2 17 (L) 22 - 32 mmol/L   Glucose, Bld 272 (H) 65 - 99 mg/dL   BUN 18 6 - 20 mg/dL   Creatinine, Ser 1.99 (H) 0.44 - 1.00 mg/dL   Calcium 8.0 (L) 8.9 - 10.3 mg/dL   Phosphorus 4.3 2.5 - 4.6 mg/dL   Albumin 2.1 (L) 3.5 - 5.0 g/dL   GFR calc non Af Amer 25 (L) >60 mL/min   GFR calc Af Amer 29 (L) >60 mL/min    Comment: (NOTE) The eGFR has been calculated using the CKD EPI equation. This calculation has not been validated in all clinical situations. eGFR's persistently <60 mL/min signify possible Chronic Kidney Disease.    Anion gap 16 (H) 5 - 15  CBC     Status: Abnormal   Collection Time: 05/15/16  3:36 AM  Result Value Ref Range   WBC 8.9 4.0 - 10.5 K/uL   RBC 2.89 (L) 3.87 - 5.11 MIL/uL   Hemoglobin 9.1 (L) 12.0 - 15.0 g/dL   HCT 27.9 (L) 36.0 - 46.0 %   MCV 96.5 78.0 - 100.0 fL   MCH 31.5 26.0 - 34.0 pg   MCHC 32.6 30.0 - 36.0 g/dL   RDW 13.8 11.5 - 15.5 %   Platelets 393 150 - 400 K/uL  Glucose, capillary     Status: Abnormal   Collection Time: 05/15/16  4:25 AM  Result Value Ref Range   Glucose-Capillary 310 (H) 65 - 99 mg/dL  Glucose, capillary     Status: Abnormal   Collection Time: 05/15/16  7:46 AM  Result Value Ref Range   Glucose-Capillary 171 (H) 65 - 99 mg/dL  Glucose, capillary     Status: Abnormal   Collection Time: 05/15/16 11:15 AM  Result Value Ref Range   Glucose-Capillary 127 (H) 65 - 99  mg/dL  Glucose, capillary     Status: Abnormal   Collection Time: 05/15/16  4:18 PM  Result Value Ref Range   Glucose-Capillary 265 (H) 65 - 99 mg/dL   Comment 1 Notify RN    Ct Abdomen Pelvis Wo Contrast  05/15/2016  CLINICAL DATA:  Diarrhea. EXAM: CT ABDOMEN AND PELVIS WITHOUT CONTRAST TECHNIQUE: Multidetector CT imaging of the abdomen and pelvis was performed following the standard protocol without IV contrast. COMPARISON:  CT scan of November 19, 2011. FINDINGS: Multilevel degenerative disc disease is noted in the lumbar spine. Moderate bilateral pleural effusions are noted with adjacent subsegmental atelectasis. Status post cholecystectomy. No focal abnormality is noted in the liver, spleen or pancreas on these unenhanced images. Adrenal glands and kidneys appear normal. No hydronephrosis or renal obstruction is noted. No renal or ureteral calculi are noted. There is no evidence of bowel obstruction. Minimal amount of ascites is noted in the pelvis. Status post hysterectomy. Urinary bladder decompressed secondary to Foley catheter. Ovaries are unremarkable. Moderate anasarca is noted.  IMPRESSION: Moderate anasarca. Moderate bilateral pleural effusions with adjacent subsegmental atelectasis. Minimal ascites is noted. No other significant abnormality seen in the abdomen or pelvis. Electronically Signed   By: Marijo Conception, M.D.   On: 05/15/2016 16:11   Mr Brain Wo Contrast  05/15/2016  CLINICAL DATA:  70 year old female with headache and dizziness. Recent altered mental status and hyperglycemia. Initial encounter. EXAM: MRI HEAD WITHOUT CONTRAST TECHNIQUE: Multiplanar, multiecho pulse sequences of the brain and surrounding structures were obtained without intravenous contrast. COMPARISON:  Head CT without contrast 05/06/2016 and earlier. [facility] Brain MRI 04/10/2005. FINDINGS: Major intracranial vascular flow voids are stable since 2006. There is a small area of cortical  restricted diffusion in the inferomedial right occipital pole (series 3, image 18) associated with mild T2 and FLAIR hyperintensity. No associated mass effect. No definite hemorrhage; prominent appearance of cortical veins in the region of diffusion abnormality on susceptibility imaging series 9, image 33. There do also appear to be 1 or 2 small foci of restricted diffusion in the right hippocampal formation (series 3, image 20). Furthermore, there is a punctate area of trace diffusion abnormality indeterminate for restriction in the medial left thalamus on series 3, image 26. No other restricted diffusion identified. Generalized cerebral volume loss since 2006 but no cortical encephalomalacia identified. There is a chronic micro hemorrhage in the lateral left thalamus (series 9, image 50). Outside of this in the acute findings described earlier gray and white matter signal is normal for age. Mild right mastoid effusion. Negative nasopharynx. Left mastoids are clear. Mild paranasal sinus mucosal thickening. Postoperative changes to both globes. Negative orbit and scalp soft tissues. Normal bone marrow signal. Negative visualized cervical spine. IMPRESSION: 1. Small acute infarct in the left occipital pole. 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 2. No mass effect or definite acute hemorrhage. 3. No other acute intracranial abnormality. Mild for age nonspecific chronic signal changes in the brain otherwise. Electronically Signed   By: Genevie Ann M.D.   On: 05/15/2016 14:07    Assessment: 71 y.o. female with a history of diabetes mellitus, chronic kidney disease, hypertension and alcohol abuse, and admitted for altered mental status and respiratory failure, with findings of acute left occipital and thalamic strokes as well as  small right hippocampal stroke.  Stroke Risk Factors - diabetes mellitus, family history and hypertension  Plan: 1. HgbA1c, fasting lipid panel 2. MRA  of the brain without contrast 3. PT consult, OT consult 4. Echocardiogram 5. Carotid dopplers 6. Prophylactic therapy-Antiplatelet med: Aspirin 81 mg per day 7. Risk factor modification 8. Telemetry monitoring  C.R. [name] Kindred, MD [facility] [phone]  05/15/2016, 8:28 PM

## 2016-05-15 NOTE — Progress Notes (Signed)
Speech Language Pathology Treatment: Dysphagia  Patient Details Name: Cassandra [name] MRN: [id number] DOB: Feb 14, 1946 Today's Date: 05/15/2016 Time: 9735-3299 SLP Time Calculation (min) (ACUTE ONLY): 10 min  Assessment / Plan / Recommendation Clinical Impression  Pt is tolerating POs well with acute dysphagia s/p intubation now resolved.  There are no overt s/s of aspiration.  Appetite is minimal, however.  Diet downgraded to soft per GI.  She prefers to have her meds crushed in puree.  Swallow function is WNL - no further SLP f/u is needed.  Our services will sign off.    HPI HPI: 70 y.o. female with PMH of Type 1 Diabetes mellitus, Hypothyroidism, Hypertension, CKD stage 3 (GFR 30-59 ml/min), Tobacco use, GERD, elevated liver enzymes (Hep B/C neg 2014). She presents to the ER 05/06/2016 with altered mental status onset last night. On a good day pt is normally ambulatory and performs ADL's independently. Pt has been complaining of frequent migraines and vertigo for about the last year she is followed by ENT. Husband endorses a similar episode of confusion about 8 years ago, which turned out to be caused by a blood sugar of over 1200. Patient is also a chronic alcohol user, husband states pt drinks everyday throughout the day for the past 10-15 years, last alcoholic beverage per husband was 05/05/2016. According to pts. husband pt began acting different around 5 or 6 pm 05/05/2016 with slight confusion. Pt tossed and mumbled throughout the night. On 05/06/2016 she had persistent confusion and the husband was unsure as to what he should do. Husband then received a call from the patient's ENT physicians office notifying him that the patient missed an appointment this morning. When he told them what was going on, they advised him to call 911. Upon EMS arrival CBG reading "High," pts. last dose of insulin was Sunday morning 05/05/2016. PCCM consulted 05/06/2016 for management of acute hypoxic respiratory  failure, acute encephalopathy and DKA      SLP Plan  All goals met     Recommendations  Diet recommendations: Thin liquid (soft diet per GI) Liquids provided via: Straw Medication Administration: Crushed with puree Supervision: Patient able to self feed Compensations: Slow rate;Small sips/bites             Oral Care Recommendations: Oral care BID Plan: All goals met     GO              Cassandra [name] L. Tivis Ringer, [place] CCC/SLP Pager [phone]   Juan Quam Laurice 05/15/2016, 2:33 PM

## 2016-05-15 NOTE — Progress Notes (Signed)
PT Cancellation Note  Patient Details Name: Cassandra AbbeBillie A Bottoms MRN: [id number] DOB: March 28, 1946   Cancelled Treatment:    Reason Eval/Treat Not Completed: Other (comment); patient reports getting ready to go to CT scan, had MRI earlier.  Reports now she wishes to go to SNF rehab.  RN case manager aware.  Will attempt again tomorrow.   Elray McgregorCynthia Keyshawna Prouse 05/15/2016, 5:15 PM  Sheran Lawlessyndi Villa [place], [place]PT [phone] 05/15/2016

## 2016-05-16 ENCOUNTER — Inpatient Hospital Stay (HOSPITAL_COMMUNITY): Payer: Medicare Other

## 2016-05-16 ENCOUNTER — Encounter: Payer: Self-pay | Admitting: Family Medicine

## 2016-05-16 DIAGNOSIS — I639 Cerebral infarction, unspecified: Secondary | ICD-10-CM

## 2016-05-16 DIAGNOSIS — F411 Generalized anxiety disorder: Secondary | ICD-10-CM

## 2016-05-16 DIAGNOSIS — I69998 Other sequelae following unspecified cerebrovascular disease: Secondary | ICD-10-CM

## 2016-05-16 DIAGNOSIS — H8110 Benign paroxysmal vertigo, unspecified ear: Secondary | ICD-10-CM

## 2016-05-16 DIAGNOSIS — N189 Chronic kidney disease, unspecified: Secondary | ICD-10-CM

## 2016-05-16 DIAGNOSIS — G9340 Encephalopathy, unspecified: Secondary | ICD-10-CM

## 2016-05-16 DIAGNOSIS — I6330 Cerebral infarction due to thrombosis of unspecified cerebral artery: Secondary | ICD-10-CM

## 2016-05-16 DIAGNOSIS — H53469 Homonymous bilateral field defects, unspecified side: Secondary | ICD-10-CM

## 2016-05-16 DIAGNOSIS — N179 Acute kidney failure, unspecified: Secondary | ICD-10-CM

## 2016-05-16 DIAGNOSIS — J9601 Acute respiratory failure with hypoxia: Secondary | ICD-10-CM

## 2016-05-16 HISTORY — DX: Cerebral infarction, unspecified: I63.9

## 2016-05-16 LAB — GASTROINTESTINAL PANEL BY PCR, STOOL (REPLACES STOOL CULTURE)
ADENOVIRUS F40/41: NOT DETECTED
ASTROVIRUS: NOT DETECTED
CAMPYLOBACTER SPECIES: NOT DETECTED
Cryptosporidium: NOT DETECTED
Cyclospora cayetanensis: NOT DETECTED
E. coli O157: NOT DETECTED
ENTEROAGGREGATIVE E COLI (EAEC): NOT DETECTED
ENTEROPATHOGENIC E COLI (EPEC): NOT DETECTED
ENTEROTOXIGENIC E COLI (ETEC): NOT DETECTED
Entamoeba histolytica: NOT DETECTED
GIARDIA LAMBLIA: NOT DETECTED
NOROVIRUS GI/GII: NOT DETECTED
PLESIMONAS SHIGELLOIDES: NOT DETECTED
Rotavirus A: NOT DETECTED
Salmonella species: NOT DETECTED
Sapovirus (I, II, IV, and V): NOT DETECTED
Shiga like toxin producing E coli (STEC): NOT DETECTED
Shigella/Enteroinvasive E coli (EIEC): NOT DETECTED
Vibrio cholerae: NOT DETECTED
Vibrio species: NOT DETECTED
Yersinia enterocolitica: NOT DETECTED

## 2016-05-16 LAB — GLUCOSE, CAPILLARY
GLUCOSE-CAPILLARY: 84 mg/dL (ref 65–99)
GLUCOSE-CAPILLARY: 79 mg/dL (ref 65–99)
GLUCOSE-CAPILLARY: 141 mg/dL — AB (ref 65–99)
GLUCOSE-CAPILLARY: 317 mg/dL — AB (ref 65–99)
Glucose-Capillary: 155 mg/dL — ABNORMAL HIGH (ref 65–99)
Glucose-Capillary: 284 mg/dL — ABNORMAL HIGH (ref 65–99)

## 2016-05-16 LAB — COMPREHENSIVE METABOLIC PANEL
ALBUMIN: 1.9 g/dL — AB (ref 3.5–5.0)
ALK PHOS: 51 U/L (ref 38–126)
ALT: 23 U/L (ref 14–54)
AST: 14 U/L — AB (ref 15–41)
Anion gap: 12 (ref 5–15)
BUN: 20 mg/dL (ref 6–20)
CALCIUM: 8 mg/dL — AB (ref 8.9–10.3)
CHLORIDE: 107 mmol/L (ref 101–111)
CO2: 22 mmol/L (ref 22–32)
CREATININE: 2.07 mg/dL — AB (ref 0.44–1.00)
GFR calc Af Amer: 27 mL/min — ABNORMAL LOW (ref >60)
GFR calc non Af Amer: 23 mL/min — ABNORMAL LOW (ref >60)
GLUCOSE: 104 mg/dL — AB (ref 65–99)
Potassium: 3.3 mmol/L — ABNORMAL LOW (ref 3.5–5.1)
SODIUM: 141 mmol/L (ref 135–145)
Total Bilirubin: 0.9 mg/dL (ref 0.3–1.2)
Total Protein: 4.3 g/dL — ABNORMAL LOW (ref 6.5–8.1)

## 2016-05-16 LAB — CBC
HEMATOCRIT: 27 % — AB (ref 36.0–46.0)
Hemoglobin: 8.7 g/dL — ABNORMAL LOW (ref 12.0–15.0)
MCH: 30.9 pg (ref 26.0–34.0)
MCHC: 32.2 g/dL (ref 30.0–36.0)
MCV: 95.7 fL (ref 78.0–100.0)
PLATELETS: 409 10*3/uL — AB (ref 150–400)
RBC: 2.82 MIL/uL — ABNORMAL LOW (ref 3.87–5.11)
RDW: 13.6 % (ref 11.5–15.5)
WBC: 9.9 10*3/uL (ref 4.0–10.5)

## 2016-05-16 LAB — LIPASE, BLOOD: Lipase: 80 U/L — ABNORMAL HIGH (ref 11–51)

## 2016-05-16 MED ORDER — POTASSIUM CHLORIDE CRYS ER 20 MEQ PO TBCR
40.0000 meq | EXTENDED_RELEASE_TABLET | Freq: Once | ORAL | Status: AC
  Administered 2016-05-17: 40 meq via ORAL
  Filled 2016-05-16: qty 2

## 2016-05-16 NOTE — Progress Notes (Signed)
STROKE TEAM PROGRESS NOTE   HISTORY OF PRESENT ILLNESS (per record) Willaim ShengBillie A Aldana is an 70 y.o. female history diabetes mellitus, hypertension, hypothyroidism, chronic kidney disease, bipolar disorder and alcohol abuse admitted on 05/06/2016 for altered mental status as well as respiratory distress. Patient extubated herself on 05/09/2016 and has remained extubated. Mental status has improved and apparently is back to baseline. She gives a history of episodes of severe vertigo with associated nausea and vomiting for about one year. She has experienced several episodes since admission, the latest of which was yesterday. She's been taking meclizine which helps to some extent. MRI of her brain was obtained which showed acute left occipital pole infarction as well as punctate acute to subacute infarctions involving right hippocampus and thalamus, all involving posterior circulation and possibly due to emboli of arterial source. Patient has had no visual changes. She has not experienced any focal abnormalities involving the extremities nor change in speech. No facial droop has been noted. Her LKW is unknown. Patient was not administered IV t-PA secondary to unclear when last known well.    SUBJECTIVE (INTERVAL HISTORY) Patient states she is having some vision difficulties for last couple of weeks and then she had an episode where she fell down.Subsequently she was admitted with altered mental status and respiratory distress and after extubation has noticed dizziness.She denies any prior history of strokes, TIAs.i obtained a complete history from the patient and 14 system review of systems which is positive only for stated above   OBJECTIVE Temp:  [97.9 F (36.6 C)-98.6 F (37 C)] 97.9 F (36.6 C) (06/01 0644) Pulse Rate:  [77-89] 87 (06/01 0644) Cardiac Rhythm:  [-] Normal sinus rhythm (05/31 1900) Resp:  [16-17] 17 (05/31 2019) BP: (149-190)/(60-78) 161/67 mmHg (06/01 0644) SpO2:  [93 %-94 %] 94 %  (06/01 0644)  CBC:  Recent Labs Lab 05/15/16 0336 05/16/16 0320  WBC 8.9 9.9  HGB 9.1* 8.7*  HCT 27.9* 27.0*  MCV 96.5 95.7  PLT 393 409*   Basic Metabolic Panel:  Recent Labs Lab 05/11/16 0537  05/14/16 0310 05/15/16 0336 05/16/16 0320  NA 137  < > 141 140 141  K 3.1*  < > 3.8 4.0 3.3*  CL 104  < > 108 107 107  CO2 21*  < > 20* 17* 22  GLUCOSE 181*  < > 153* 272* 104*  BUN 22*  < > 18 18 20   CREATININE 2.42*  < > 1.86* 1.99* 2.07*  CALCIUM 7.4*  < > 7.9* 8.0* 8.0*  MG 1.7  --  1.7  --   --   PHOS 2.8  < > 3.7 4.3  --   < > = values in this interval not displayed.  Lipid Panel: No results found for: CHOL, TRIG, HDL, CHOLHDL, VLDL, LDLCALC HgbA1c:  Lab Results  Component Value Date   HGBA1C 7.5* 10/07/2013   Urine Drug Screen:    Component Value Date/Time   LABOPIA NONE DETECTED 05/06/2016 1136   LABOPIA NEGATIVE 06/16/2012 1924   COCAINSCRNUR NONE DETECTED 05/06/2016 1136   COCAINSCRNUR NEGATIVE 06/16/2012 1924   LABBENZ NONE DETECTED 05/06/2016 1136   LABBENZ NEGATIVE 06/16/2012 1924   AMPHETMU NONE DETECTED 05/06/2016 1136   AMPHETMU NEGATIVE 06/16/2012 1924   THCU NONE DETECTED 05/06/2016 1136   THCU NEGATIVE 06/16/2012 1924   LABBARB NONE DETECTED 05/06/2016 1136   LABBARB NEGATIVE 06/16/2012 1924      IMAGING  Ct Abdomen Pelvis Wo Contrast  05/15/2016  CLINICAL DATA:  Diarrhea. EXAM: CT ABDOMEN AND PELVIS WITHOUT CONTRAST TECHNIQUE: Multidetector CT imaging of the abdomen and pelvis was performed following the standard protocol without IV contrast. COMPARISON:  CT scan of November 19, 2011. FINDINGS: Multilevel degenerative disc disease is noted in the lumbar spine. Moderate bilateral pleural effusions are noted with adjacent subsegmental atelectasis. Status post cholecystectomy. No focal abnormality is noted in the liver, spleen or pancreas on these unenhanced images. Adrenal glands and kidneys appear normal. No hydronephrosis or renal obstruction is  noted. No renal or ureteral calculi are noted. There is no evidence of bowel obstruction. Minimal amount of ascites is noted in the pelvis. Status post hysterectomy. Urinary bladder decompressed secondary to Foley catheter. Ovaries are unremarkable. Moderate anasarca is noted. IMPRESSION: Moderate anasarca. Moderate bilateral pleural effusions with adjacent subsegmental atelectasis. Minimal ascites is noted. No other significant abnormality seen in the abdomen or pelvis. Electronically Signed   By: Lupita Raider, M.D.   On: 05/15/2016 16:11   Mr Brain Wo Contrast  05/15/2016  CLINICAL DATA:  71 year old female with headache and dizziness. Recent altered mental status and hyperglycemia. Initial encounter. EXAM: MRI HEAD WITHOUT CONTRAST TECHNIQUE: Multiplanar, multiecho pulse sequences of the brain and surrounding structures were obtained without intravenous contrast. COMPARISON:  Head CT without contrast 05/06/2016 and earlier. [facility] Brain MRI 04/10/2005. FINDINGS: Major intracranial vascular flow voids are stable since 2006. There is a small area of cortical restricted diffusion in the inferomedial right occipital pole (series 3, image 18) associated with mild T2 and FLAIR hyperintensity. No associated mass effect. No definite hemorrhage; prominent appearance of cortical veins in the region of diffusion abnormality on susceptibility imaging series 9, image 33. There do also appear to be 1 or 2 small foci of restricted diffusion in the right hippocampal formation (series 3, image 20). Furthermore, there is a punctate area of trace diffusion abnormality indeterminate for restriction in the medial left thalamus on series 3, image 26. No other restricted diffusion identified. Generalized cerebral volume loss since 2006 but no cortical encephalomalacia identified. There is a chronic micro hemorrhage in the lateral left thalamus (series 9, image 50). Outside of this in the acute findings  described earlier gray and white matter signal is normal for age. Mild right mastoid effusion. Negative nasopharynx. Left mastoids are clear. Mild paranasal sinus mucosal thickening. Postoperative changes to both globes. Negative orbit and scalp soft tissues. Normal bone marrow signal. Negative visualized cervical spine. IMPRESSION: 1. Small acute infarct in the left occipital pole. 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 2. No mass effect or definite acute hemorrhage. 3. No other acute intracranial abnormality. Mild for age nonspecific chronic signal changes in the brain otherwise. Electronically Signed   By: Odessa Fleming M.D.   On: 05/15/2016 14:07   2D Echocardiogram  - EF 55-60% with no source of embolus.  - Left ventricle: The cavity size was normal. There was mild focal basal hypertrophy of the septum. Systolic function was vigorous. The estimated ejection fraction was in the range of 65% to 70%. Wall motion was normal; there were no regional wall motion abnormalities. Doppler parameters are consistent with abnormal left ventricular relaxation (grade 1 diastolic dysfunction). - Mitral valve: Moderately calcified annulus. - Pericardium, extracardiac: There was a left pleural effusion.   PHYSICAL EXAM Pleasant middle aged Caucasian lady not in distress. . Afebrile.  Head is nontraumatic. Neck is supple without bruit.    Cardiac exam no murmur or gallop. Lungs are clear to auscultation. Distal pulses are well felt.skin petechiae present. Neurological Exam :  Awake alert oriented 3 with normal speech and language function. Diminished attention and recall.No dysarthria or aphasia. Pupils irregular but equal reactive. Fundi were not visualized. Vision acuity seems adequate. Extraocular movements are full range  without nystagmus. Diminished vision acuity in the left eye. Left  partial homonymous hemianopsia.Face is symmetric without weakness.Tongue is midline. Motor system exam reveals no upper or lower extremity drift. Symmetric and equal strength in all 4 extremities. Deep tendon reflexes are 1+ symmetric. Ankle jerks are depressed. Plantars are downgoing. Touch and pinprick sensation are preserved bilaterally. Gait was not tested. ASSESSMENT/PLAN Cassandra [name] is a 70 y.o. female with history of diabetes mellitus, hypertension, hypothyroidism, chronic kidney disease, bipolar disorder and alcohol abuse admitted 05/06/2016 with mental status and respiratory failure who has long-standing vertigo with 3 episodes post extubation. MRI done shows L occipital as well as punctate acute/subacute R hippocampus and thalamic infarcts. She did not receive IV t-PA due to unknown LKW.   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  Resultant partial left homonymous hemianopsia  MRI  R occipital as well as punctate acute/subacute R hippocampus and thalamic infarcts  MRA  Right PCA stenosis  Carotid Doppler  ordered  2D Echo  EF 65-70%. No source of embolus   LDL ordered  HgbA1c ordered  Heparin 5000 units sq tid for VTE prophylaxis  DIET SOFT Room service appropriate?: Yes; Fluid consistency:: Thin  No antithrombotic prior to admission, now on aspirin 81 and plavix 75 mg  Patient counseled to be compliant with her antithrombotic medications  Ongoing aggressive stroke risk factor management  Therapy recommendations:  SNF (have asked CIR to reassess)  Disposition:  Likely SNF  Accelerated Hypertension  Elevated but Stable,  160-190s  Permissive hypertension (OK if < 220/120) but gradually normalize in 5-7 days  Diabetes type I Diabetic ketoacidosis, hypokalemia, hypophosphatemia   HgbA1c pending, goal <  7.0  Other Stroke Risk Factors  Advanced age  Cigarette smoker, advised to stop smoking  ETOH abuse, chronic  Family hx stroke (mother, paternal grandmother)  Other Active Problems  Acute hypoxic respiratory failure in pt with underlying COPD and Tobacco abuse  Diarrhea  Hemorrhoids  ARF on CKD stage III  Septic shock  Anemia of chronic disease  Mixed encephalopathy  Hospital day # 10  I have personally examined this patient, reviewed notes, independently viewed imaging studies, participated in medical decision making and plan of care. I have made any additions or clarifications directly to the above note. Agree with note above.  She presented with altered mental status and dizziness and has embolic right PCA infarcts due to intracranial atherosclerosis large vessel disease.She is at risk for current strokes, TIAs, neurological worseningand needs ongoing stroke evaluation and aggressive risk factor control.I counseled the patient to quit smoking and drinking alcohol.i also advised her to stop taking Goody powders to decrease the risk of bleeding and bruising.Recommend aspirin 81 mg and Plavix 75 mg daily for 3 months followed by aspirin alone.the patient also expressed interest in possible participation in the stroke atrial fibrillation trial and she was given information to review and make a decision.No study specific procedures were carried out prior to signing informed consent..Greater than 50% of time during this 35 minute visit was spent  on counseling and coordination of care about her stroke risk in stroke prevention.Discussed with Dr. Randa Ngo, MD Medical Director [facility] Pager: [phone] 05/16/2016 1:02 PM     To contact Stroke Continuity provider, please refer to [email or web address]. After hours, contact General Neurology

## 2016-05-16 NOTE — Progress Notes (Signed)
Patient off floor when I came to see her.  GI pathogen panel pending.  Continue probiotics.  CT reviewed, patient is malnourished (albumin <2) and her edema and anasarca are likely at least in part reflective of that.  Will revisit tomorrow.

## 2016-05-16 NOTE — Progress Notes (Signed)
Patient ID: Cassandra [name], female   DOB: April 14, 1946, 70 y.o.   MRN: [id number]   PROGRESS NOTE    Cassandra [name]  OXB:[id number] DOB: 08-Nov-1946 DOA: 05/06/2016  PCP: Cammy Copa, MD   Brief Narrative:  This is a 70 y.o. female with PMH of Type 1 Diabetes mellitus, Hypothyroidism, Hypertension, CKD stage 3 (GFR 30-59 ml/min), Tobacco use, GERD, elevated liver enzymes (Hep B/C neg 2014). She presented to the ER 05/06/2016 with altered mental status onset last night. Patient is also a chronic alcohol user, husband stated pt drinks everyday throughout the day for the past 10-15 years, last alcoholic beverage per husband was 05/05/2016.   PCCM consulted 05/06/2016 for management of acute hypoxic respiratory failure, acute encephalopathy and DKA  SIGNIFICANT EVENTS: 5/22 Pt arrived via EMS with AMS and elevated blood glucose level 5/22 Pt intubated in ED secondary to acute hypoxic respiratory distress 5/23 repletion of electrolytes  5/25 vomited 5/25 self extubation 5/27 [facility] took over from [facility]   Assessment & Plan: Acute hypoxic respiratory failure in pt with underlying COPD and Tobacco abuse - Self extubated 5/25 - CT chest notable for small to moderate layering bilateral pleural effusions with associated compressive lower lobe atelectasis. Mild to moderate anasarca, patchy peribronchovascular and peripheral ground-glass opacity and consolidation throughout both lungs, most prominent in the right upper lobe - per CT chest, differential includes multilobar pneumonia, noncardiogenic pulmonary edema/ARDS or alveolar hemorrhage, vasculitis. 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 - Resultant partial left homonymous hemianopsia - MRI R occipital as well as punctate acute/subacute R hippocampus and thalamic infarcts - MRA Right PCA stenosis - Carotid Doppler pending  - 2D Echo EF 65-70%. No source of embolus  - now on aspirin 81 and plavix 75 mg for three months and then aspirin alone  - appreciate neurology team following   Diarrhea - unclear etiology - Zosyn was stopped 5/30 - GI team consulted for assistance, we appreciate help  - stool pathogen negative   Hemorrhoids - resolved   Acute renal failure on chronic kidney disease stage III (Baseline creatinine 1.1-1.5) - ? ATN likely mediated by DKA/sepsis - Cr up slightly in the past 48 hours, is still up in am, consider lowering the dose of Lasix  - BMP in AM   Diabetic ketoacidosis, hypokalemia, hypophosphatemia  - resolved, K is WNL  - good CBG control  - keep on SSI for now  Septic shock - unclear etiology, ?UTI - completed 9 days of Zosyn  - dc vanco 5/25 - WBC is WNL, pt remains afebrile  Thrombocytosis - reactive   Hypokalemia - mild, from lasix - supplement and repeat BMP in AM  Anemia of chronic disease - alcohol use and malnutrition - no signs of bleeding, Hg overall stable and at baseline  - CBC in AM  Chronic alcohol abuse - keep on CIWA, no signs of withdrawal    Acute encephalopathy - Hx: Migraines, Bipolar Disorder, ETOH abuse, Benign paroxysmal positional vertigo - resolved   DVT prophylaxis: Heparin SQ Code  Status: Full  Family Communication: Patient at bedside, daughter instructed to call my cell phone for update as she is currently at the beach and is unable to be here  Disposition Plan: SNF recommended, pt says she is in agreement with plan   Consultants:   Nephrology    GI  Neurology   Procedures:   ETT>>5/22>>5/25 self extubation  Right IJ CVC>>5/22  R radial a-line 5/22>>>5/25  Antimicrobials:   Vancomycin 5/22>>5/25  Zosyn 5/22>5/30   Subjective: Reports persistent diarrhea   Objective: Filed Vitals:   05/16/16 0433 05/16/16 0644 05/16/16 1541 05/16/16 1607  BP: 190/78 161/67 172/69 170/67  Pulse: 81 87 86 86  Temp:  97.9 F (36.6 C)  98.9 F (37.2 C)  TempSrc:  Oral  Oral  Resp:    18  Height:      Weight:      SpO2: 93% 94%  92%   No intake or output data in the 24 hours ending 05/16/16 2007 Filed Weights   05/13/16 0530 05/14/16 0433 05/15/16 0449  Weight: 55.021 kg (121 lb 4.8 oz) 56.246 kg (124 lb) 57.425 kg (126 lb 9.6 oz)    Examination:  General exam: Appears tired, sleepy, NAD Respiratory system: rales bilaterally, diminished breath sounds at bases  Cardiovascular system: S1 & S2 heard, RRR. No JVD, murmurs, rubs, gallops or clicks. +1 LE edema Gastrointestinal system: Abdomen is nondistended, soft and nontender.  Central nervous system: Alert and oriented. Diminished vision acuity in the left eye. Left partial homonymous hemianopsia.Face is symmetric without weakness.Tongue is midline. Motor system exam reveals no upper or lower extremity drift. Symmetric and equal strength in all 4 extremities.   Data Reviewed: I have personally reviewed following labs and imaging studies  CBC:  Recent Labs Lab 05/12/16 0909 05/13/16 0220 05/14/16 0310 05/15/16 0336 05/16/16 0320  WBC 9.1 8.6 7.3 8.9 9.9  HGB 8.6* 8.2* 8.7* 9.1* 8.7*  HCT 25.6* 25.3* 27.1* 27.9* 27.0*  MCV 94.5 96.9 96.8 96.5 95.7  PLT 235 291 315 393 947*   Basic Metabolic Panel:  Recent Labs Lab 05/10/16 0353 05/11/16 0537 05/12/16 0909 05/13/16 0220 05/14/16 0310 05/15/16 0336 05/16/16 0320  NA 137 137 141 142 141 140 141  K 4.9 3.1* 3.4* 3.5 3.8 4.0 3.3*  CL 105 104 108 110 108 107 107  CO2 22 21* 24 23 20* 17* 22  GLUCOSE 246*  181* 93 165* 153* 272* 104*  BUN 24* 22* '17 17 18 18 20  '$ CREATININE 2.62* 2.42* 1.93* 1.96* 1.86* 1.99* 2.07*  CALCIUM 6.7* 7.4* 8.0* 7.8* 7.9* 8.0* 8.0*  MG 2.0 1.7  --   --  1.7  --   --   PHOS 5.6* 2.8  --  2.1* 3.7 4.3  --     Liver Function Tests:  Recent Labs Lab 05/13/16 0220 05/14/16 0310 05/15/16 0336 05/16/16 0320  AST  --   --   --  14*  ALT  --   --   --  23  ALKPHOS  --   --   --  51  BILITOT  --   --   --  0.9  PROT  --   --   --  4.3*  ALBUMIN 2.0* 2.0* 2.1* 1.9*    Recent Labs Lab 05/10/16 0359 05/16/16 0320  LIPASE 21 80*  AMYLASE 55  --    CBG:  Recent Labs Lab 05/16/16 0019 05/16/16 0430 05/16/16 0820 05/16/16 1108 05/16/16 1629  GLUCAP 155*  84 79 141* 284*   Recent Results (from the past 240 hour(s))  C difficile quick scan w PCR reflex     Status: None   Collection Time: 05/11/16 12:02 PM  Result Value Ref Range Status   C Diff antigen NEGATIVE NEGATIVE Final   C Diff toxin NEGATIVE NEGATIVE Final   C Diff interpretation Negative for toxigenic C. difficile  Final  Gastrointestinal Panel by PCR , Stool     Status: None   Collection Time: 05/15/16  7:59 PM  Result Value Ref Range Status   Campylobacter species NOT DETECTED NOT DETECTED Final   Plesimonas shigelloides NOT DETECTED NOT DETECTED Final   Salmonella species NOT DETECTED NOT DETECTED Final   Yersinia enterocolitica NOT DETECTED NOT DETECTED Final   Vibrio species NOT DETECTED NOT DETECTED Final   Vibrio cholerae NOT DETECTED NOT DETECTED Final   Enteroaggregative E coli (EAEC) NOT DETECTED NOT DETECTED Final   Enteropathogenic E coli (EPEC) NOT DETECTED NOT DETECTED Final   Enterotoxigenic E coli (ETEC) NOT DETECTED NOT DETECTED Final   Shiga like toxin producing E coli (STEC) NOT DETECTED NOT DETECTED Final   E. coli O157 NOT DETECTED NOT DETECTED Final   Shigella/Enteroinvasive E coli (EIEC) NOT DETECTED NOT DETECTED Final   Cryptosporidium NOT DETECTED NOT DETECTED Final    Cyclospora cayetanensis NOT DETECTED NOT DETECTED Final   Entamoeba histolytica NOT DETECTED NOT DETECTED Final   Giardia lamblia NOT DETECTED NOT DETECTED Final   Adenovirus F40/41 NOT DETECTED NOT DETECTED Final   Astrovirus NOT DETECTED NOT DETECTED Final   Norovirus GI/GII NOT DETECTED NOT DETECTED Final   Rotavirus A NOT DETECTED NOT DETECTED Final   Sapovirus (I, II, IV, and V) NOT DETECTED NOT DETECTED Final      Radiology Studies: Ct Chest Wo Contrast 05/12/2016  Small to moderate layering bilateral pleural effusions with associated compressive lower lobe atelectasis. Mild to moderate anasarca. 2. Nonspecific patchy peribronchovascular and peripheral ground-glass opacity and consolidation throughout both lungs, most prominent in the right upper lobe. Associated mild interlobular septal thickening. Differential includes multilobar pneumonia, noncardiogenic pulmonary edema/ARDS or alveolar hemorrhage such as due to vasculitis. 3. Two-vessel coronary atherosclerosis.     Scheduled Meds: . antiseptic oral rinse  7 mL Mouth Rinse QID  . carvedilol  12.5 mg Oral BID WC  . chlorhexidine gluconate (SAGE KIT)  15 mL Mouth Rinse BID  . escitalopram  10 mg Oral QHS  . feeding supplement (GLUCERNA SHAKE)  237 mL Oral TID BM  . folic acid  1 mg Oral Daily  . furosemide  40 mg Oral Daily  . heparin  5,000 Units Subcutaneous Q8H  . hydrALAZINE  10 mg Oral Q8H  . hydrocortisone   Rectal QID  . insulin aspart  0-15 Units Subcutaneous TID WC  . insulin aspart  3 Units Subcutaneous TID WC  . insulin glargine  5 Units Subcutaneous QHS  . levothyroxine  100 mcg Oral QAC breakfast  . pantoprazole  40 mg Oral BID  . phenylephrine  1 suppository Rectal BID  . phosphorus  250 mg Oral BID  . pneumococcal 23 valent vaccine  0.5 mL Intramuscular Tomorrow-1000  . saccharomyces boulardii  250 mg Oral BID  . thiamine  100 mg Oral Daily   Continuous Infusions: . sodium chloride 10 mL/hr at 05/10/16  2013     LOS: 10 days    Time spent: 20 minutes    Faye Ramsay, MD [facility] Pager [phone]  If 7PM-7AM, please contact night-coverage [email or web address] Password [email or web address] 05/16/2016, 8:07 PM

## 2016-05-16 NOTE — Consult Note (Signed)
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 right handed female with history of diabetes mellitus, hypertension, chronic renal insufficiency with creatinine 1.1-1.5, chronic alcohol and tobacco abuse. Patient lives with spouse. Reported to be independent prior to admission with recent past. One level home steps to entry. Husband does all the cooking, cleaning and laundry as well as grocery shopping. Presented 05/06/2016 with altered mental status, headache and vertigo. Glucose of 655 and potassium 6.6. Cranial CT scan demonstrated small vessel ischemic changes no acute intracranial abnormalities. Creatinine elevated at 4.17. Renal service is consulted. Renal ultrasound unremarkable. Placed on gentle IV fluids with renal function stabilizing 2.07. Acute hypoxic respiratory failure patient did require intubation for a short time and self extubated 05/09/2016. CT /Chest x-ray showed mild bibasilar atelectasis and/or infiltrates. Maintained on broad-spectrum antibiotics. Gastroenterology consulted 05/15/2016 for diarrhea as well as intermittent nausea vomiting. CT of abdomen and pelvis showed moderate anasarca with minimal ascites. No other significant abnormality identified. Suspect nausea vomiting related to diabetic mediated gastroparesis as well as chronic alcohol abuse. Neurology consulted 05/15/2016 for recurrent spells of vertigo as well as nausea. MRI of the brain showed left occipital infarct as well as small right hypocampal infarct. MRA head and neck pending. Echocardiogram with ejection fraction 70% grade 1 diastolic dysfunction.. Presently on subcutaneous heparin for DVT prophylaxis. Neurology workup presently ongoing. Currently on a mechanical soft diet. Physical therapy evaluations completed with recommendations of physical medicine  rehabilitation consult.  Patient feels the main limitation she has is her severe dizziness. She has a past history of benign positional vertigo and responded well to physical therapy treatments at [facility] physical therapy department.  Review of Systems  Constitutional: Negative for fever and chills.  HENT: Negative for hearing loss.   Eyes: Positive for blurred vision. Negative for double vision.  Respiratory: Negative for cough and shortness of breath.   Cardiovascular: Positive for leg swelling. Negative for chest pain and palpitations.  Gastrointestinal: Positive for nausea and constipation. Negative for vomiting.       GERD  Genitourinary: Negative for dysuria and hematuria.  Musculoskeletal: Positive for myalgias and falls.  Skin: Negative for rash.  Neurological: Positive for dizziness, weakness and headaches. Negative for seizures.  Psychiatric/Behavioral: Positive for depression.       Anxiety  All other systems reviewed and are negative.  Past Medical History  Diagnosis Date  . Diabetes mellitus without complication (HCC)   . Thyroid disease   . 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 Depression   . Hypertension   . CKD (chronic kidney disease) stage 3, GFR 30-59 ml/min   . Hypothyroidism   . Vitamin B12 deficiency   . Benign paroxysmal positional vertigo   . Broken shoulder   . Broken toes   . Broken finger   . Migraines   . GERD (gastroesophageal reflux disease)   . Anxiety   . Interstitial cystitis     bladder stretched every 9 months  . Allergic rhinitis   . Glaucoma   . Benign hypertension with CKD (chronic kidney disease) stage III   . Tobacco use   . Cervicalgia   . Elevated liver enzymes Hep B/C neg 2014  . Bipolar disorder (HCC)   . History of alcohol use   . Psoriasis   . 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 Appendectomy    .  Tonsillectomy    . Cholecystectomy    . Hernia repair    . Cataract extraction    . Abdominal hysterectomy      with oophorectomy  . Bladder surgery    . Cervical disc surgery     Family History  Problem Relation Age of Onset  . Alcohol abuse Mother   . Arthritis Mother   . Asthma Mother   . Cancer Mother     colon cancer  . Hypertension Mother   . Migraines Mother   . Stroke Mother   . Lung disease Mother   . COPD Mother   . Diabetes Father   . Hypertension Father   . Heart disease Father   . Heart attack Father   . Heart disease Paternal Grandmother   . Diabetes Paternal Grandmother   . Stroke Paternal Grandmother   . Cancer Paternal Grandmother   . Diabetes Paternal Grandfather    Social History:  reports that she has been smoking Cigarettes.  She has a 2.5 pack-year smoking history. She has never used smokeless tobacco. She reports that she drinks about 4.2 oz of alcohol per week. She reports that she does not use illicit drugs. Allergies:  Allergies  Allergen Reactions  . Ciprofloxacin Other (See Comments)    No reaction specified  . Codeine Diarrhea and Nausea And Vomiting  . Doxycycline Diarrhea and Nausea And Vomiting  . Hydrocodone   . Omnicef [Cefdinir] Nausea Only and Other (See Comments)    Constipation, tolerated Zosyn   Medications Prior to Admission  Medication Sig Dispense Refill  . carvedilol (COREG) 12.5 MG tablet Take 1 tablet (12.5 mg total) by mouth 2 (two) times daily with a meal. 60 tablet 11  . cetirizine (ZYRTEC) 10 MG tablet Take 10 mg by mouth daily as needed for allergies.    . hydrochlorothiazide (HYDRODIURIL) 25 MG tablet Take 1 tablet (25 mg total) by mouth daily. 30 tablet 5  . insulin lispro (HUMALOG) 100 UNIT/ML injection Inject 3-10 Units into the skin 3 (three) times daily with meals. Sliding scale as needed    . lactobacillus acidophilus & bulgar (LACTINEX) chewable tablet Chew 1 tablet by mouth daily.    Marland Kitchen LANTUS 100 UNIT/ML  injection Inject 10 Units into the skin every morning.   5  . levothyroxine (SYNTHROID, LEVOTHROID) 100 MCG tablet Take 1 tablet by mouth daily. Take only on Mon, Wed, Friday.    . losartan (COZAAR) 100 MG tablet Take 1 tablet (100 mg total) by mouth daily. 30 tablet 3  . sertraline (ZOLOFT) 50 MG tablet Take 1.5 tablets (75 mg total) by mouth daily. 45 tablet 5  . cyanocobalamin (,VITAMIN B-12,) 1000 MCG/ML injection Inject 1,000 mcg into the muscle every 30 (thirty) days.    Marland Kitchen LORazepam (ATIVAN) 0.5 MG tablet Take 0.5 mg by mouth every 8 (eight) hours as needed for anxiety.   2  . meclizine (ANTIVERT) 25 MG tablet Take 1 tablet (25 mg total) by mouth 3 (three) times daily as needed for dizziness. 30 tablet 0  . sodium chloride (MURO 128) 5 % ophthalmic solution Reported on 01/17/2016      Home: Home Living Family/patient expects to be discharged to:: Private residence Living Arrangements: Spouse/significant other Available Help at Discharge: Family, Available 24 hours/day Type of Home: Apartment Home Access: Level entry Home Layout: One level  Bathroom Shower/Tub: Health visitor: Standard Bathroom Accessibility: Yes Home Equipment: None Additional Comments: Husband does all cooking, cleaning and laundry as well as grocery shopping.   Functional History: Prior Function Level of Independence: Independent Comments: Husband reports 2-3 falls in the last few months.  Functional Status:  Mobility: Bed Mobility Overal bed mobility: Needs Assistance Bed Mobility: Rolling, Sit to Sidelying, Sidelying to Sit Rolling: Supervision Sidelying to sit: Mod assist Sit to sidelying: Min assist General bed mobility comments: increased time, heavy rail use and assist to lift trunk Transfers Overall transfer level: Needs assistance Equipment used: Rolling walker (2 wheeled), 1 person hand held assist Transfers: Sit to/from Stand, Stand Pivot Transfers Sit to Stand: Mod assist, Min  assist Stand pivot transfers: Min assist, Mod assist General transfer comment: Initial sit to stand required mod A to bring hips up. Used bil forearm support to pivot ot BSC and mod A. Stood from bsc with min A . Ambulation/Gait Ambulation/Gait assistance: Min assist Ambulation Distance (Feet): 3 Feet Assistive device: Rolling walker (2 wheeled) Gait Pattern/deviations: Step-to pattern, Decreased step length - right, Decreased step length - left, Shuffle, Trunk flexed General Gait Details: Assist for support and balance Gait velocity: decr Gait velocity interpretation: Below normal speed for age/gender    ADL:    Cognition: Cognition Overall Cognitive Status: No family/caregiver present to determine baseline cognitive functioning Orientation Level: Oriented X4 Cognition Arousal/Alertness: Awake/alert Behavior During Therapy: Anxious Overall Cognitive Status: No family/caregiver present to determine baseline cognitive functioning Area of Impairment: Memory, Problem solving, Following commands Orientation Level: Disoriented to, Place Memory: Decreased short-term memory Following Commands: Follows one step commands with increased time, Follows one step commands consistently Safety/Judgement: Decreased awareness of safety Awareness: Intellectual Problem Solving: Difficulty sequencing, Requires verbal cues, Decreased initiation, Slow processing  Blood pressure 161/67, pulse 87, temperature 97.9 F (36.6 C), temperature source Oral, resp. rate 17, height 5\' 5"  (1.651 m), weight 57.425 kg (126 lb 9.6 oz), SpO2 94 %. Physical Exam  Vitals reviewed. Constitutional: She appears well-developed.  HENT:  Head: Normocephalic.  Eyes: EOM are normal.  Neck: Normal range of motion. Neck supple. No thyromegaly present.  Cardiovascular: Normal rate and regular rhythm.   Respiratory: Effort normal and breath sounds normal. No respiratory distress.  GI: Soft. Bowel sounds are normal. She  exhibits no distension.  Neurological: She is alert.  Follow simple commands. Provide the name, place and date of birth. Fair awareness of deficits  Skin: Skin is warm and dry.    Results for orders placed or performed during the hospital encounter of 05/06/16 (from the past 24 hour(s))  Glucose, capillary     Status: Abnormal   Collection Time: 05/15/16 11:15 AM  Result Value Ref Range   Glucose-Capillary 127 (H) 65 - 99 mg/dL  Glucose, capillary     Status: Abnormal   Collection Time: 05/15/16  4:18 PM  Result Value Ref Range   Glucose-Capillary 265 (H) 65 - 99 mg/dL   Comment 1 Notify RN   Glucose, capillary     Status: Abnormal   Collection Time: 05/15/16  8:10 PM  Result Value Ref Range   Glucose-Capillary 136 (H) 65 - 99 mg/dL   Comment 1 Notify RN    Comment 2 Document in Chart   Glucose, capillary     Status: Abnormal   Collection Time: 05/16/16 12:19 AM  Result Value Ref Range   Glucose-Capillary 155 (H) 65 - 99 mg/dL   Comment 1 Notify RN  Comment 2 Document in Chart   Comprehensive metabolic panel     Status: Abnormal   Collection Time: 05/16/16  3:20 AM  Result Value Ref Range   Sodium 141 135 - 145 mmol/L   Potassium 3.3 (L) 3.5 - 5.1 mmol/L   Chloride 107 101 - 111 mmol/L   CO2 22 22 - 32 mmol/L   Glucose, Bld 104 (H) 65 - 99 mg/dL   BUN 20 6 - 20 mg/dL   Creatinine, Ser 4.09 (H) 0.44 - 1.00 mg/dL   Calcium 8.0 (L) 8.9 - 10.3 mg/dL   Total Protein 4.3 (L) 6.5 - 8.1 g/dL   Albumin 1.9 (L) 3.5 - 5.0 g/dL   AST 14 (L) 15 - 41 U/L   ALT 23 14 - 54 U/L   Alkaline Phosphatase 51 38 - 126 U/L   Total Bilirubin 0.9 0.3 - 1.2 mg/dL   GFR calc non Af Amer 23 (L) >60 mL/min   GFR calc Af Amer 27 (L) >60 mL/min   Anion gap 12 5 - 15  Lipase, blood     Status: Abnormal   Collection Time: 05/16/16  3:20 AM  Result Value Ref Range   Lipase 80 (H) 11 - 51 U/L  CBC     Status: Abnormal   Collection Time: 05/16/16  3:20 AM  Result Value Ref Range   WBC 9.9 4.0 -  10.5 K/uL   RBC 2.82 (L) 3.87 - 5.11 MIL/uL   Hemoglobin 8.7 (L) 12.0 - 15.0 g/dL   HCT 81.1 (L) 91.4 - 78.2 %   MCV 95.7 78.0 - 100.0 fL   MCH 30.9 26.0 - 34.0 pg   MCHC 32.2 30.0 - 36.0 g/dL   RDW 95.6 21.3 - 08.6 %   Platelets 409 (H) 150 - 400 K/uL  Glucose, capillary     Status: None   Collection Time: 05/16/16  4:30 AM  Result Value Ref Range   Glucose-Capillary 84 65 - 99 mg/dL   Comment 1 Notify RN    Comment 2 Document in Chart   Glucose, capillary     Status: None   Collection Time: 05/16/16  8:20 AM  Result Value Ref Range   Glucose-Capillary 79 65 - 99 mg/dL   Ct Abdomen Pelvis Wo Contrast  05/15/2016  CLINICAL DATA:  Diarrhea. EXAM: CT ABDOMEN AND PELVIS WITHOUT CONTRAST TECHNIQUE: Multidetector CT imaging of the abdomen and pelvis was performed following the standard protocol without IV contrast. COMPARISON:  CT scan of November 19, 2011. FINDINGS: Multilevel degenerative disc disease is noted in the lumbar spine. Moderate bilateral pleural effusions are noted with adjacent subsegmental atelectasis. Status post cholecystectomy. No focal abnormality is noted in the liver, spleen or pancreas on these unenhanced images. Adrenal glands and kidneys appear normal. No hydronephrosis or renal obstruction is noted. No renal or ureteral calculi are noted. There is no evidence of bowel obstruction. Minimal amount of ascites is noted in the pelvis. Status post hysterectomy. Urinary bladder decompressed secondary to Foley catheter. Ovaries are unremarkable. Moderate anasarca is noted. IMPRESSION: Moderate anasarca. Moderate bilateral pleural effusions with adjacent subsegmental atelectasis. Minimal ascites is noted. No other significant abnormality seen in the abdomen or pelvis. Electronically Signed   By: Lupita Raider, M.D.   On: 05/15/2016 16:11   Mr Brain Wo Contrast  05/15/2016  CLINICAL DATA:  70 year old female with headache and dizziness. Recent altered mental status and  hyperglycemia. Initial encounter. EXAM: MRI HEAD WITHOUT CONTRAST TECHNIQUE: Multiplanar, multiecho  pulse sequences of the brain and surrounding structures were obtained without intravenous contrast. COMPARISON:  Head CT without contrast 05/06/2016 and earlier. [facility] Brain MRI 04/10/2005. FINDINGS: Major intracranial vascular flow voids are stable since 2006. There is a small area of cortical restricted diffusion in the inferomedial right occipital pole (series 3, image 18) associated with mild T2 and FLAIR hyperintensity. No associated mass effect. No definite hemorrhage; prominent appearance of cortical veins in the region of diffusion abnormality on susceptibility imaging series 9, image 33. There do also appear to be 1 or 2 small foci of restricted diffusion in the right hippocampal formation (series 3, image 20). Furthermore, there is a punctate area of trace diffusion abnormality indeterminate for restriction in the medial left thalamus on series 3, image 26. No other restricted diffusion identified. Generalized cerebral volume loss since 2006 but no cortical encephalomalacia identified. There is a chronic micro hemorrhage in the lateral left thalamus (series 9, image 50). Outside of this in the acute findings described earlier gray and white matter signal is normal for age. Mild right mastoid effusion. Negative nasopharynx. Left mastoids are clear. Mild paranasal sinus mucosal thickening. Postoperative changes to both globes. Negative orbit and scalp soft tissues. Normal bone marrow signal. Negative visualized cervical spine. IMPRESSION: 1. Small acute infarct in the left occipital pole. 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 2. No mass effect or definite acute hemorrhage. 3. No other  acute intracranial abnormality. Mild for age nonspecific chronic signal changes in the brain otherwise. Electronically Signed   By: Odessa Fleming M.D.   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 Does the need for close, 24 hr/day medical supervision in concert with the patient's rehab needs make it unreasonable for this patient to be served in a less intensive setting? Yes 2. Co-Morbidities requiring supervision/potential complications: History of alcohol and tobacco abuse, acute respiratory failure status post intubation, diabetic gastroparesis. 3. Due to bladder management, bowel management, safety, skin/wound care, disease management, medication administration, pain management and patient education, does the patient require 24 hr/day rehab nursing? Yes 4. Does the patient require coordinated care of a physician, rehab nurse, PT (1-2 hrs/day, 5 days/week), OT (1-2 hrs/day, 5 days/week) and SLP (.5-1 hrs/day, 3-5 days/week) to address physical and functional deficits in the context of the above medical diagnosis(es)? Yes Addressing deficits in the following areas: balance, endurance, locomotion, strength, transferring, bowel/bladder control, bathing, dressing, feeding, grooming, toileting and Field cut, left 5. Can the patient actively participate in an intensive therapy program of at least 3 hrs of therapy per day at least 5 days per week? Yes 6. The potential for patient to make measurable gains while on inpatient rehab is good 7. Anticipated functional outcomes upon discharge from inpatient rehab are modified independent and supervision  with PT, modified independent and supervision with OT, improved attention to the left side compensating for field cut with SLP. 8. Estimated rehab length of stay to reach the above functional goals is: 9-12 days 9. Does the patient have  adequate social supports and living environment to accommodate these discharge functional goals? Yes 10. Anticipated D/C setting: Home 11. Anticipated post D/C treatments: HH therapy and Outpatient therapy 12. Overall Rehab/Functional Prognosis: excellent  RECOMMENDATIONS: This patient's condition  is appropriate for continued rehabilitative care in the following setting: CIR Patient has agreed to participate in recommended program. Yes Note that insurance prior authorization may be required for reimbursement for recommended care.  Comment: PT will need to start on her benign positional vertigo and once this is treated she'll be able to anticipate better in the rehabilitation program    05/16/2016

## 2016-05-16 NOTE — Progress Notes (Signed)
Physical Therapy Treatment Patient Details Name: Cassandra [name] MRN: [id number] DOB: Feb 16, 1946 Today's Date: 05/16/2016    History of Present Illness 70 y.o. female adm with septic shock, acute respiratory failure, DKA, and acute encephalopathy. Intubated 5/22-5/25. PMH of Type 1 DM, Hypothyroidism, HTN, CKD stage 3, Tobacco use, GERD, elevated liver enzymes (Hep B/C neg 2014), ETOH abuse/  On a good day pt is normally ambulatory and performs ADL's independently. Pt has been complaining of frequent migraines and vertigo for about the last year she is followed by ENT.    PT Comments    Pt admitted with above diagnosis. Pt currently with functional limitations due to dizziness and endurance deficits. Pt with positive BPPV and treated for bil BPPV.  Pt reports that dizziness less after treatment.  Will continue acute PT.   Pt will benefit from skilled PT to increase their independence and safety with mobility to allow discharge to the venue listed below.    Follow Up Recommendations  SNF     Equipment Recommendations  Other (comment) (TBA)    Recommendations for Other Services       Precautions / Restrictions Precautions Precautions: Fall Restrictions Weight Bearing Restrictions: No    Mobility  Bed Mobility Overal bed mobility: Needs Assistance Bed Mobility: Rolling;Sit to Sidelying;Sidelying to Sit Rolling: Supervision Sidelying to sit: Mod assist     Sit to sidelying: Min assist General bed mobility comments: increased time, heavy rail use and assist to lift trunk  Transfers                 General transfer comment: refused OOB  Ambulation/Gait                 Stairs            Wheelchair Mobility    Modified Rankin (Stroke Patients Only)       Balance Overall balance assessment: Needs assistance Sitting-balance support: Bilateral upper extremity supported;Feet supported Sitting balance-Leahy Scale: Poor Sitting balance - Comments: no  LOB sitting EOB but needed UE support after BPPV treatment.                            Cognition Arousal/Alertness: Awake/alert Behavior During Therapy: Anxious Overall Cognitive Status: No family/caregiver present to determine baseline cognitive functioning Area of Impairment: Memory;Problem solving;Following commands Orientation Level: Disoriented to;Place   Memory: Decreased short-term memory Following Commands: Follows one step commands with increased time;Follows one step commands consistently Safety/Judgement: Decreased awareness of safety Awareness: Intellectual Problem Solving: Difficulty sequencing;Requires verbal cues;Decreased initiation;Slow processing      Exercises      General Comments General comments (skin integrity, edema, etc.): Pt tested positive for left BPPV therefore treated for left BPPV with canalith repositioning maneuver.  Then noted a change in nystagmus therefore treated for right BPPV.  Pt reported that dizziness was notably decreased after the treatments.        Pertinent Vitals/Pain Pain Assessment: No/denies pain  VSS    Home Living                      Prior Function            PT Goals (current goals can now be found in the care plan section) Progress towards PT goals: Not progressing toward goals - comment (somewhat self limiting)    Frequency  Min 3X/week    PT Plan Current plan remains appropriate  Co-evaluation             End of Session Equipment Utilized During Treatment: Gait belt Activity Tolerance: Patient limited by fatigue Patient left: in bed;with call bell/phone within reach;with bed alarm set     Time: 1202-1217 PT Time Calculation (min) (ACUTE ONLY): 15 min  Charges:  $Canalith Rep Proc: 8-22 mins                    G Codes:      Berline Lopes 07-Jun-2016, 2:12 PM Reona Zendejas,PT Acute Rehabilitation [phone] (pager)

## 2016-05-17 ENCOUNTER — Encounter (HOSPITAL_COMMUNITY): Payer: Self-pay

## 2016-05-17 ENCOUNTER — Inpatient Hospital Stay (HOSPITAL_COMMUNITY): Payer: Medicare Other

## 2016-05-17 DIAGNOSIS — Z9289 Personal history of other medical treatment: Secondary | ICD-10-CM

## 2016-05-17 DIAGNOSIS — I6330 Cerebral infarction due to thrombosis of unspecified cerebral artery: Secondary | ICD-10-CM

## 2016-05-17 DIAGNOSIS — E1011 Type 1 diabetes mellitus with ketoacidosis with coma: Secondary | ICD-10-CM

## 2016-05-17 LAB — GLUCOSE, CAPILLARY
GLUCOSE-CAPILLARY: 189 mg/dL — AB (ref 65–99)
Glucose-Capillary: 122 mg/dL — ABNORMAL HIGH (ref 65–99)
Glucose-Capillary: 213 mg/dL — ABNORMAL HIGH (ref 65–99)

## 2016-05-17 LAB — LIPID PANEL
CHOL/HDL RATIO: 2.8 ratio
Cholesterol: 198 mg/dL (ref 0–200)
HDL: 71 mg/dL (ref >40)
LDL Cholesterol: 87 mg/dL (ref 0–99)
TRIGLYCERIDES: 201 mg/dL — AB (ref <150)
VLDL: 40 mg/dL (ref 0–40)

## 2016-05-17 LAB — CBC
HCT: 28.4 % — ABNORMAL LOW (ref 36.0–46.0)
HEMOGLOBIN: 9.2 g/dL — AB (ref 12.0–15.0)
MCH: 31.6 pg (ref 26.0–34.0)
MCHC: 32.4 g/dL (ref 30.0–36.0)
MCV: 97.6 fL (ref 78.0–100.0)
PLATELETS: 437 10*3/uL — AB (ref 150–400)
RBC: 2.91 MIL/uL — AB (ref 3.87–5.11)
RDW: 13.5 % (ref 11.5–15.5)
WBC: 9 10*3/uL (ref 4.0–10.5)

## 2016-05-17 LAB — BASIC METABOLIC PANEL
Anion gap: 10 (ref 5–15)
BUN: 18 mg/dL (ref 6–20)
CHLORIDE: 105 mmol/L (ref 101–111)
CO2: 25 mmol/L (ref 22–32)
CREATININE: 1.58 mg/dL — AB (ref 0.44–1.00)
Calcium: 8.1 mg/dL — ABNORMAL LOW (ref 8.9–10.3)
GFR, EST AFRICAN AMERICAN: 38 mL/min — AB (ref >60)
GFR, EST NON AFRICAN AMERICAN: 33 mL/min — AB (ref >60)
Glucose, Bld: 269 mg/dL — ABNORMAL HIGH (ref 65–99)
POTASSIUM: 3.4 mmol/L — AB (ref 3.5–5.1)
SODIUM: 140 mmol/L (ref 135–145)

## 2016-05-17 LAB — PHOSPHORUS: PHOSPHORUS: 4.3 mg/dL (ref 2.5–4.6)

## 2016-05-17 MED ORDER — ASPIRIN EC 81 MG PO TBEC
81.0000 mg | DELAYED_RELEASE_TABLET | Freq: Every day | ORAL | Status: DC
  Administered 2016-05-17 – 2016-05-19 (×3): 81 mg via ORAL
  Filled 2016-05-17 (×3): qty 1

## 2016-05-17 MED ORDER — CLOPIDOGREL BISULFATE 75 MG PO TABS
75.0000 mg | ORAL_TABLET | Freq: Every day | ORAL | Status: DC
  Administered 2016-05-17 – 2016-05-19 (×3): 75 mg via ORAL
  Filled 2016-05-17 (×3): qty 1

## 2016-05-17 MED ORDER — ATORVASTATIN CALCIUM 10 MG PO TABS
10.0000 mg | ORAL_TABLET | Freq: Every day | ORAL | Status: DC
  Administered 2016-05-17 – 2016-05-18 (×2): 10 mg via ORAL
  Filled 2016-05-17 (×2): qty 1

## 2016-05-17 MED ORDER — MECLIZINE HCL 25 MG PO TABS
25.0000 mg | ORAL_TABLET | Freq: Three times a day (TID) | ORAL | Status: DC | PRN
  Administered 2016-05-17 – 2016-05-19 (×6): 25 mg via ORAL
  Filled 2016-05-17 (×6): qty 1

## 2016-05-17 NOTE — Clinical Social Work Note (Signed)
CSW continuing to follow for support and discharge needs. Shonny Myka Lukins, LCSW [phone] 

## 2016-05-17 NOTE — Progress Notes (Signed)
Inpatient Diabetes Program Recommendations  AACE/ADA: New Consensus Statement on Inpatient Glycemic Control (2015)  Target Ranges:  Prepandial:   less than 140 mg/dL      Peak postprandial:   less than 180 mg/dL (1-2 hours)      Critically ill patients:  140 - 180 mg/dL  Results for [facility]Mitrano, Arleen A (MRN [id number]) as of 05/17/2016 11:39  Ref. Range 05/16/2016 11:08 05/16/2016 16:29 05/16/2016 20:51 05/17/2016 06:27 05/17/2016 11:28  Glucose-Capillary Latest Ref Range: 65-99 mg/dL 409141 (H) 811284 (H) 914317 (H) 189 (H) 122 (H)   Review of Glycemic Control  Diabetes history: DM1 Outpatient Diabetes medications: Lantus 10 units QAM, Humalog 3-10 units tidwc Current orders for Inpatient glycemic control: Lantus 5 units QAM. Novolog 3 units tidwc for meal coverage insulin when diet is advanced (hold if eats <50% Novolog moderate Q4H  Inpatient Diabetes Program Recommendations:  Spoke with nurse and patient is not eating well some of her meals so holding meal coverage if eats < 50% meals.  Thank you, Billy FischerJudy E. Juanya Villavicencio, RN, MSN, CDE Inpatient Glycemic Control Team Team Pager [phone] (8am-5pm) 05/17/2016 11:42 AM

## 2016-05-17 NOTE — Progress Notes (Signed)
Left two messages with CSW for placement to SNF.Pt resting with call bell within reach.  Will continue to monitor.

## 2016-05-17 NOTE — Progress Notes (Addendum)
Inpatient Rehabilitation  Met with patient to discuss team's recommendation for IP Rehab. Shared booklets and answered question.  Patient interested in program, after discussing spacing out her 3+ hours of therapy.  I have contacted MD to request OT orders and await completion of OT eval for insurance authorization.  Notified by RN CM that patient will likely be discharged today.  Please call with questions.  Carmelia Roller., CCC/SLP Admission Coordinator  [place]  Cell [phone]

## 2016-05-17 NOTE — Progress Notes (Signed)
Inpatient Rehabilitation  Received a call from OT that eval was complete.  I also received a call from daughter, Lowella Bandyikki regarding the wishes of her mother and her family.  Lowella Bandyikki stated that they want IP Rehab placement stopped and for her mother to go to [facility].  She stated that she has contacted them already to initiate placement.  Discussed the differences between SNF and IP Rehab level of medical care as well as therapy; however, daughter reported [place] in [place].  I spoke with patient who confirmed this.  Notified RN, RN CM, CSW and insurance.  Will sign off at this time.    Charlane FerrettiMelissa Amethyst Gainer, M.A., CCC/SLP Admission Coordinator  [facility]  Cell [phone]

## 2016-05-17 NOTE — Care Management Important Message (Signed)
Important Message  Patient Details  Name: Cassandra [name] MRN: [id number] Date of Birth: [birth year]   Medicare Important Message Given:  Yes    Bernadette HoitShoffner, Candido Flott Coleman 05/17/2016, 9:27 AM

## 2016-05-17 NOTE — Progress Notes (Signed)
Occupational Therapy Evaluation Patient Details Name: MELINNA LINAREZ MRN: [id number] DOB: 05-03-1946 Today's Date: 05/17/2016    History of Present Illness 70 y.o. female adm with septic shock, acute respiratory failure, DKA, and acute encephalopathy. Intubated 5/22-5/25. 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 Pt has been complaining of frequent migraines and vertigo for about the last year she is followed by ENT.   Clinical Impression   At baseline, pt is independent with ADL and mobility. Currently, pt only able to tolerate stand pivot transfer to chair with Min A and requires mod A with ADL tasks. Pt reports visual changes and complains of vision being "blurred". Pt states "I'm just so weak but I want to do this". Pt did miss several targets in L visual field. Feel pt would be an excellent CIR candidate to address below deficits to maximize functional level of independence to facilitate safe D/C home with family. Family very supportive. Will follow acutely to address established goals.     Follow Up Recommendations  CIR;Supervision/Assistance - 24 hour    Equipment Recommendations  3 in 1 bedside comode;Tub/shower bench    Recommendations for Other Services Rehab consult     Precautions / Restrictions Precautions Precautions: Fall Precaution Comments: c/o vertigo      Mobility Bed Mobility Overal bed mobility: Needs Assistance Bed Mobility: Supine to Sit   Sidelying to sit: Mod assist       General bed mobility comments: "I'm just so weak"  Transfers Overall transfer level: Needs assistance Equipment used: 1 person hand held  assist Transfers: Sit to/from Stand;Stand Pivot Transfers Sit to Stand: Min assist Stand pivot transfers: Min assist  Unable to ambulate at this time due to complaints of dizziness and weakness.            Balance     Sitting balance-Leahy Scale: Fair       Standing balance-Leahy Scale: Poor                              ADL Overall ADL's : Needs assistance/impaired Eating/Feeding: Supervision/ safety Eating/Feeding Details (indicate cue type and reason): Nsg reports pt having incresaed difficulty with swallowing and just made pt NPO Grooming: Set up;Supervision/safety;Sitting   Upper Body Bathing: Set up;Supervision/ safety;Sitting   Lower Body Bathing: Moderate assistance;Sit to/from stand   Upper Body Dressing : Minimal assistance;Sitting   Lower Body Dressing: Moderate assistance;Sit to/from stand Lower Body Dressing Details (indicate cue type and reason): unable to cross legs to donn socks Toilet Transfer: Minimal assistance;Stand-pivot   Toileting- Clothing Manipulation and Hygiene: Moderate assistance;Sit to/from stand       Functional mobility during ADLs: Minimal assistance (stand pivot onnly) General ADL Comments: Pt stating she wasnts to be more independent with taking care of herself. Educated pt on stabilizing gaze during mobility to help decrease dizziness.      Vision Vision Assessment?: Yes Eye Alignment: Within Functional Limits Ocular Range of Motion: Within Functional Limits Alignment/Gaze Preference: Within Defined Limits Tracking/Visual Pursuits: Decreased smoothness of horizontal tracking Saccades: Additional eye shifts occurred during testing;Additional head turns occurred during testing Convergence: Within functional limits Visual  Fields: Left visual field deficit Additional Comments: Pt reports "blurred vision". during field testing, pt missing targets in L field B eyes   Perception Perception Comments: appears intact    Praxis Praxis Praxis tested?: Within functional limits    Pertinent Vitals/Pain Pain Assessment: 0-10 Pain Score: 3  Pain Location: headache Pain Descriptors / Indicators: Aching Pain Intervention(s): Limited activity within patient's tolerance     Hand Dominance Right   Extremity/Trunk Assessment Upper Extremity Assessment Upper Extremity Assessment: Generalized weakness   Lower Extremity Assessment Lower Extremity Assessment: Defer to PT evaluation;Generalized weakness   Cervical / Trunk Assessment Cervical / Trunk Assessment: Normal   Communication Communication Communication: No difficulties   Cognition Arousal/Alertness: Awake/alert Behavior During Therapy: Anxious Overall Cognitive Status: Impaired/Different from baseline Area of Impairment: Memory               General Comments: Reports incresed difficulty with memory "lately"   General Comments       Exercises       Shoulder Instructions      Home Living Family/patient expects to be discharged to:: Unsure Living Arrangements: Spouse/significant other Available Help at Discharge: Family;Available 24 hours/day Type of Home: Apartment Home Access: Level entry     Home Layout: One level     Bathroom Shower/Tub: Producer, television/film/videoWalk-in shower   Bathroom Toilet: Standard Bathroom Accessibility: Yes How Accessible: Accessible via walker Home Equipment: None          Prior Functioning/Environment Level of Independence: Independent        Comments: daughter reports increased difficulty taking care of herself with the "dizziness"    OT Diagnosis: Generalized weakness;Disturbance of vision;Cognitive deficits;Acute pain   OT Problem List: Decreased strength;Decreased activity tolerance;Impaired vision/perception;Impaired balance (sitting and/or standing);Decreased cognition;Decreased safety awareness;Decreased knowledge of use of DME or AE;Pain   OT Treatment/Interventions: Self-care/ADL  training;Therapeutic exercise;Energy conservation;DME and/or AE instruction;Therapeutic activities;Cognitive remediation/compensation;Visual/perceptual remediation/compensation;Patient/family education;Balance training    OT Goals(Current goals can be found in the care plan section) Acute Rehab OT Goals Patient Stated Goal: to be independent again OT Goal Formulation: With patient/family Time For Goal Achievement: 05/31/16 Potential to Achieve Goals: Good  OT Frequency: Min 2X/week   Barriers to D/C:            Co-evaluation              End of Session Equipment Utilized During Treatment: Gait belt Nurse Communication: Mobility status  Activity Tolerance: Patient tolerated treatment well Patient left: in chair;with call bell/phone within reach;with family/visitor present   Time: 1330-1405 OT Time Calculation (min): 35 min Charges:  OT General Charges $OT Visit: 1 Procedure OT Evaluation $OT Eval Moderate Complexity: 1 Procedure OT Treatments $Self Care/Home Management : 8-22 mins G-Codes:    Darlene Bartelt,HILLARY 05/17/2016, 2:27 PM   [facility] Khloei Spiker, OTR/L  [phone] 05/17/2016

## 2016-05-17 NOTE — Progress Notes (Signed)
Subjective: No further diarrhea. Still dizzy and weak.  Objective: Vital signs in last 24 hours: Temp:  [98.5 F (36.9 C)-99.3 F (37.4 C)] 98.5 F (36.9 C) (06/02 0528) Pulse Rate:  [80-86] 80 (06/02 0528) Resp:  [18] 18 (06/02 0528) BP: (170-190)/(66-77) 190/77 mmHg (06/02 0528) SpO2:  [91 %-94 %] 91 % (06/02 0528) Weight change:  Last BM Date: 05/16/16  PE: GEN:  Older-appearing than stated age  Lab Results: CBC    Component Value Date/Time   WBC 9.0 05/17/2016 0301   WBC 10.0 08/12/2014 1357   RBC 2.91* 05/17/2016 0301   RBC 4.14 08/12/2014 1357   HGB 9.2* 05/17/2016 0301   HGB 13.7 08/12/2014 1357   HCT 28.4* 05/17/2016 0301   HCT 40.5 08/12/2014 1357   PLT 437* 05/17/2016 0301   PLT 267 08/12/2014 1357   MCV 97.6 05/17/2016 0301   MCV 98 08/12/2014 1357   MCH 31.6 05/17/2016 0301   MCH 33.1 08/12/2014 1357   MCHC 32.4 05/17/2016 0301   MCHC 33.9 08/12/2014 1357   RDW 13.5 05/17/2016 0301   RDW 12.6 08/12/2014 1357   LYMPHSABS 2.7 05/06/2016 1113   LYMPHSABS 2.5 08/12/2014 1357   MONOABS 1.3* 05/06/2016 1113   MONOABS 0.7 08/12/2014 1357   EOSABS 0.0 05/06/2016 1113   EOSABS 0.4 08/12/2014 1357   BASOSABS 0.1 05/06/2016 1113   BASOSABS 0.1 08/12/2014 1357   CMP     Component Value Date/Time   NA 140 05/17/2016 0301   NA 136 06/15/2014 1507   K 3.4* 05/17/2016 0301   K 3.3* 06/15/2014 1507   CL 105 05/17/2016 0301   CL 99 06/15/2014 1507   CO2 25 05/17/2016 0301   CO2 25 06/15/2014 1507   GLUCOSE 269* 05/17/2016 0301   GLUCOSE 134* 06/15/2014 1507   BUN 18 05/17/2016 0301   BUN 16 06/15/2014 1507   CREATININE 1.58* 05/17/2016 0301   CREATININE 1.17 06/15/2014 1507   CALCIUM 8.1* 05/17/2016 0301   CALCIUM 9.4 06/15/2014 1507   PROT 4.3* 05/16/2016 0320   PROT 5.8* 10/06/2013 2238   ALBUMIN 1.9* 05/16/2016 0320   ALBUMIN 2.7* 10/06/2013 2238   AST 14* 05/16/2016 0320   AST 28 10/06/2013 2238   ALT 23 05/16/2016 0320   ALT 19 10/06/2013  2238   ALKPHOS 51 05/16/2016 0320   ALKPHOS 80 10/06/2013 2238   BILITOT 0.9 05/16/2016 0320   BILITOT 0.2 10/06/2013 2238   GFRNONAA 33* 05/17/2016 0301   GFRNONAA 49* 06/15/2014 1507   GFRAA 38* 05/17/2016 0301   GFRAA 56* 06/15/2014 1507   C. Diff negative.  GI pathogen panel negative  Assessment:  1. Diarrhea.Resolved.  Maybe antibiotic-associated.  On probiotics. 2. Nausea and vomiting. Recurrent problem for years. 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 3. Abdominal pain, epigastric post-prandial. Prior cholecystectomy. 4. Chronic alcohol abuse. 5. Altered mental status, DKA, sepsis-like syndrome possibly. Improving. 6.  Anemia, normocytic. 7.  Dizziness.  Plan:  1.  Continue Florastor 250 mg po bid for the next 6 weeks. 2.  Patient [facility], for management of her anemia and gastroparesis-sounding symptoms,  in 4-6 weeks ([id number]). 3.  Will sign-off; please call with questions; thank you for the consultation.   Freddy JakschOUTLAW,Taevin Mcferran M 05/17/2016, 11:14 AM   Pager [phone] If no answer or after 5 PM call [phone]

## 2016-05-17 NOTE — Progress Notes (Addendum)
Vascular Ultrasound Transcranial Doppler has been completed.   05/17/2016 3:28 PM Gertie FeyMichelle Eleshia Wooley, RVT, RDCS, RDMS

## 2016-05-17 NOTE — Progress Notes (Signed)
STROKE TEAM PROGRESS NOTE   HISTORY OF PRESENT ILLNESS (per record) Cassandra [name] is an 70 y.o. female history diabetes mellitus, hypertension, hypothyroidism, chronic kidney disease, bipolar disorder and alcohol abuse admitted on 05/06/2016 for altered mental status as well as respiratory distress. Patient extubated herself on 05/09/2016 and has remained extubated. Mental status has improved and apparently is back to baseline. She gives a history of episodes of severe vertigo with associated nausea and vomiting for about one year. She has experienced several episodes since admission, the latest of which was yesterday. She's been taking meclizine which helps to some extent. MRI of her brain was obtained which showed acute left occipital pole infarction as well as punctate acute to subacute infarctions involving right hippocampus and thalamus, all involving posterior circulation and possibly due to emboli of arterial source. Patient has had no visual changes. She has not experienced any focal abnormalities involving the extremities nor change in speech. No facial droop has been noted. Her LKW is unknown. Patient was not administered IV t-PA secondary to unclear when last known well.    SUBJECTIVE (INTERVAL HISTORY) Patient states she is having some Dizziness which is positional and a chronic problem. She attempted vestibular rehabilitation yesterday but could not tolerate. She takes meclizine when necessary at home but this has not been continued in the hospitalization yet. OBJECTIVE Temp:  [98.5 F (36.9 C)-99.3 F (37.4 C)] 98.5 F (36.9 C) (06/02 0528) Pulse Rate:  [80-86] 80 (06/02 0528) Cardiac Rhythm:  [-] Normal sinus rhythm (06/02 0700) Resp:  [18] 18 (06/02 0528) BP: (170-190)/(66-77) 190/77 mmHg (06/02 0528) SpO2:  [91 %-94 %] 91 % (06/02 0528)  CBC:   Recent Labs Lab 05/16/16 0320 05/17/16 0301  WBC 9.9 9.0  HGB 8.7* 9.2*  HCT 27.0* 28.4*  MCV 95.7 97.6  PLT 409* 437*    Basic Metabolic Panel:  Recent Labs Lab 05/11/16 0537  05/14/16 0310 05/15/16 0336 05/16/16 0320 05/17/16 0301  NA 137  < > 141 140 141 140  K 3.1*  < > 3.8 4.0 3.3* 3.4*  CL 104  < > 108 107 107 105  CO2 21*  < > 20* 17* 22 25  GLUCOSE 181*  < > 153* 272* 104* 269*  BUN 22*  < > CREATININE 2.42*  < > 1.86* 1.99* 2.07* 1.58*  CALCIUM 7.4*  < > 7.9* 8.0* 8.0* 8.1*  MG 1.7  --  1.7  --   --   --   PHOS 2.8  < > 3.7 4.3  --  4.3  < > = values in this interval not displayed.  Lipid Panel:     Component Value Date/Time   CHOL 198 05/17/2016 0301   TRIG 201* 05/17/2016 0301   HDL 71 05/17/2016 0301   CHOLHDL 2.8 05/17/2016 0301   VLDL 40 05/17/2016 0301   LDLCALC 87 05/17/2016 0301   HgbA1c:  Lab Results  Component Value Date   HGBA1C 7.5* 10/07/2013   Urine Drug Screen:     Component Value Date/Time   LABOPIA NONE DETECTED 05/06/2016 1136   LABOPIA NEGATIVE 06/16/2012 1924   COCAINSCRNUR NONE DETECTED 05/06/2016 1136   COCAINSCRNUR NEGATIVE 06/16/2012 1924   LABBENZ NONE DETECTED 05/06/2016 1136   LABBENZ NEGATIVE 06/16/2012 1924   AMPHETMU NONE DETECTED 05/06/2016 1136   AMPHETMU NEGATIVE 06/16/2012 1924   THCU NONE DETECTED 05/06/2016 1136   THCU NEGATIVE 06/16/2012 1924   LABBARB NONE DETECTED 05/06/2016 1136  LABBARB NEGATIVE 06/16/2012 1924      IMAGING  Ct Abdomen Pelvis Wo Contrast  05/15/2016  CLINICAL DATA:  Diarrhea. EXAM: CT ABDOMEN AND PELVIS WITHOUT CONTRAST TECHNIQUE: Multidetector CT imaging of the abdomen and pelvis was performed following the standard protocol without IV contrast. COMPARISON:  CT scan of November 19, 2011. FINDINGS: Multilevel degenerative disc disease is noted in the lumbar spine. Moderate bilateral pleural effusions are noted with adjacent subsegmental atelectasis. Status post cholecystectomy. No focal abnormality is noted in the liver, spleen or pancreas on these unenhanced images. Adrenal glands and kidneys  appear normal. No hydronephrosis or renal obstruction is noted. No renal or ureteral calculi are noted. There is no evidence of bowel obstruction. Minimal amount of ascites is noted in the pelvis. Status post hysterectomy. Urinary bladder decompressed secondary to Foley catheter. Ovaries are unremarkable. Moderate anasarca is noted. IMPRESSION: Moderate anasarca. Moderate bilateral pleural effusions with adjacent subsegmental atelectasis. Minimal ascites is noted. No other significant abnormality seen in the abdomen or pelvis. Electronically Signed   By: Lupita Raider, M.D.   On: 05/15/2016 16:11   Mr Maxine Glenn Head Wo Contrast  05/16/2016  CLINICAL DATA:  Stroke EXAM: MRA NECK WITHOUT   CONTRAST MRA HEAD WITHOUT CONTRAST TECHNIQUE: Multiplanar and multiecho pulse sequences of the neck were obtained without intravenous contrast. Angiographic images of the neck were obtained using MRA technique without and with intravenous contast.; Angiographic images of the Circle of Willis were obtained using MRA technique without intravenous contrast. COMPARISON:  MRI head 05/15/2016 FINDINGS: MRA NECK FINDINGS Both vertebral arteries patent to the basilar without stenosis. PICA, AICA patent bilaterally. Basilar widely patent. Superior cerebellar and posterior cerebral arteries patent. Moderate stenosis proximal right posterior cerebral artery. Left PCA widely patent. Internal carotid artery widely patent bilaterally without stenosis. Anterior and middle cerebral arteries patent bilaterally without stenosis or occlusion. Negative for cerebral aneurysm. MRA HEAD FINDINGS Carotid bifurcation widely patent bilaterally. No significant carotid stenosis. Both vertebral arteries patent to the basilar without stenosis. 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 Otherwise negative MRA of the brain No significant carotid or vertebral stenosis in the neck. Electronically Signed   By: Marlan Palau M.D.   On:  05/16/2016 11:01   Mr Angiogram Neck Wo Contrast  05/16/2016  CLINICAL DATA:  Stroke EXAM: MRA NECK WITHOUT   CONTRAST MRA HEAD WITHOUT CONTRAST TECHNIQUE: Multiplanar and multiecho pulse sequences of the neck were obtained without intravenous contrast. Angiographic images of the neck were obtained using MRA technique without and with intravenous contast.; Angiographic images of the Circle of Willis were obtained using MRA technique without intravenous contrast. COMPARISON:  MRI head 05/15/2016 FINDINGS: MRA NECK FINDINGS Both vertebral arteries patent to the basilar without stenosis. PICA, AICA patent bilaterally. Basilar widely patent. Superior cerebellar and posterior cerebral arteries patent. Moderate stenosis proximal right posterior cerebral artery. Left PCA widely patent. Internal carotid artery widely patent bilaterally without stenosis. Anterior and middle cerebral arteries patent bilaterally without stenosis or occlusion. Negative for cerebral aneurysm. MRA HEAD FINDINGS Carotid bifurcation widely patent bilaterally. No significant carotid stenosis. Both vertebral arteries patent to the basilar without stenosis. 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 Otherwise negative MRA of the brain No significant carotid or vertebral stenosis in the neck. Electronically Signed   By: Marlan Palau M.D.   On: 05/16/2016 11:01   Mr Brain Wo Contrast  05/16/2016  ADDENDUM REPORT: 05/16/2016 13:35 ADDENDUM: Correction, Impression #1 should read: 1. Small acute infarct in  the RIGHT occipital pole. Electronically Signed   By: Odessa Fleming M.D.   On: 05/16/2016 13:35  05/16/2016  CLINICAL DATA:  70 year old female with headache and dizziness. Recent altered mental status and hyperglycemia. Initial encounter. EXAM: MRI HEAD WITHOUT CONTRAST TECHNIQUE: Multiplanar, multiecho pulse sequences of the brain and surrounding structures were obtained without intravenous contrast. COMPARISON:  Head CT  without contrast 05/06/2016 and earlier. [facility] Brain MRI 04/10/2005. FINDINGS: Major intracranial vascular flow voids are stable since 2006. There is a small area of cortical restricted diffusion in the inferomedial right occipital pole (series 3, image 18) associated with mild T2 and FLAIR hyperintensity. No associated mass effect. No definite hemorrhage; prominent appearance of cortical veins in the region of diffusion abnormality on susceptibility imaging series 9, image 33. There do also appear to be 1 or 2 small foci of restricted diffusion in the right hippocampal formation (series 3, image 20). Furthermore, there is a punctate area of trace diffusion abnormality indeterminate for restriction in the medial left thalamus on series 3, image 26. No other restricted diffusion identified. Generalized cerebral volume loss since 2006 but no cortical encephalomalacia identified. There is a chronic micro hemorrhage in the lateral left thalamus (series 9, image 50). Outside of this in the acute findings described earlier gray and white matter signal is normal for age. Mild right mastoid effusion. Negative nasopharynx. Left mastoids are clear. Mild paranasal sinus mucosal thickening. Postoperative changes to both globes. Negative orbit and scalp soft tissues. Normal bone marrow signal. Negative visualized cervical spine. IMPRESSION: 1. Small acute infarct in the left occipital pole. 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 2. No mass effect or definite acute hemorrhage. 3. No other acute intracranial abnormality. Mild for age nonspecific chronic signal changes in the brain otherwise. Electronically Signed: By: Odessa Fleming M.D. On: 05/15/2016 14:07   2D Echocardiogram  - EF 55-60% with no source of  embolus.  - Left ventricle: The cavity size was normal. There was mild focal basal hypertrophy of the septum. Systolic function was vigorous. The estimated ejection fraction was in the range of 65% to 70%. Wall motion was normal; there were no regional wall motion abnormalities. Doppler parameters are consistent with abnormal left ventricular relaxation (grade 1 diastolic dysfunction). - Mitral valve: Moderately calcified annulus. - Pericardium, extracardiac: There was a left pleural effusion.   PHYSICAL EXAM Pleasant middle aged Caucasian lady not in distress. . Afebrile. Head is nontraumatic. Neck is supple without bruit.    Cardiac exam no murmur or gallop. Lungs are clear to auscultation. Distal pulses are well felt.skin petechiae present. Neurological Exam :  Awake alert oriented 3 with normal speech and language function. Diminished attention and recall.No dysarthria or aphasia. Pupils irregular but equal reactive. Fundi were not visualized. Vision acuity seems adequate. Extraocular movements are full range without nystagmus. Diminished vision acuity in the left eye. Left  partial homonymous hemianopsia.Face is symmetric without weakness.Tongue is midline. Motor system exam reveals no upper or lower extremity drift. Symmetric and equal strength in all 4 extremities. Deep tendon reflexes are 1+ symmetric. Ankle jerks are depressed. Plantars are downgoing. Touch and pinprick sensation are preserved bilaterally. Gait was not tested. ASSESSMENT/PLAN [name] is a 70 y.o. female with history of diabetes mellitus, hypertension, hypothyroidism, chronic kidney disease, bipolar disorder and alcohol abuse admitted 05/06/2016 with  mental status and respiratory failure who has long-standing vertigo with 3 episodes post extubation. MRI done shows L occipital as well as punctate acute/subacute R hippocampus and thalamic infarcts. She did not receive IV t-PA due to unknown LKW.   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  Resultant partial left homonymous hemianopsia  MRI  R occipital as well as punctate acute/subacute R hippocampus and thalamic infarcts  MRA  Right PCA stenosis  Carotid Doppler  ordered  2D Echo  EF 65-70%. No source of embolus   LDL 87  HgbA1c 7.5  Heparin 5000 units sq tid for VTE prophylaxis Diet regular Room service appropriate?: Yes; Fluid consistency:: Thin  No antithrombotic prior to admission, now on aspirin 81 and plavix 75 mg  Patient counseled to be compliant with her antithrombotic medications  Ongoing aggressive stroke risk factor management  Therapy recommendations:  SNF (have asked CIR to reassess)  Disposition:  Likely SNF  Accelerated Hypertension  Elevated but Stable,  160-190s  Permissive hypertension (OK if < 220/120) but gradually normalize in 5-7 days Hyperlipidemia. Start Lipitor 10 mg daily  Diabetes type I Diabetic ketoacidosis, hypokalemia, hypophosphatemia   HgbA1c 7.5, goal < 7.0  Other Stroke Risk Factors  Advanced age  Cigarette smoker, advised to stop smoking  ETOH abuse, chronic  Family hx stroke (mother, paternal grandmother)  Other Active Problems  Acute hypoxic respiratory failure in pt with underlying COPD and Tobacco abuse  Diarrhea  Hemorrhoids  ARF on CKD stage III  Septic shock  Anemia of chronic disease  Mixed encephalopathy  Hospital day # 3111  I have personally examined this patient, reviewed notes, independently viewed imaging studies, participated in medical decision making and plan of care. I have made any additions or clarifications directly to the above note. Agree with note above.  She presented with altered mental status and dizziness and has embolic right PCA infarcts due to intracranial atherosclerosis large vessel disease. Continue aspirin 81 mg and Plavix 75 mg daily for 3 months  followed by aspirin alone.the patient also expressed interest in possible participation in the stroke atrial fibrillation trial and she was given information to review and make a decision.No study specific procedures were carried out prior to signing informed consent. Start Lipitor 10 mg daily. Start meclizine 25 mg when necessary 3 times a day for vertigo and physical therapy for vestibular rehabilitation..Greater than 50% of time during this 25 minute visit was spent on counseling and coordination of care about her stroke risk in stroke prevention   Delia HeadyPramod Sethi, MD Medical Director [facility] Pager: [phone] 05/17/2016 12:03 PM     To contact Stroke Continuity provider, please refer to [email or web address]. After hours, contact General Neurology

## 2016-05-18 DIAGNOSIS — Z9289 Personal history of other medical treatment: Secondary | ICD-10-CM | POA: Insufficient documentation

## 2016-05-18 DIAGNOSIS — E1011 Type 1 diabetes mellitus with ketoacidosis with coma: Secondary | ICD-10-CM | POA: Insufficient documentation

## 2016-05-18 LAB — GLUCOSE, CAPILLARY
GLUCOSE-CAPILLARY: 245 mg/dL — AB (ref 65–99)
GLUCOSE-CAPILLARY: 189 mg/dL — AB (ref 65–99)
GLUCOSE-CAPILLARY: 175 mg/dL — AB (ref 65–99)
GLUCOSE-CAPILLARY: 388 mg/dL — AB (ref 65–99)
Glucose-Capillary: 144 mg/dL — ABNORMAL HIGH (ref 65–99)

## 2016-05-18 LAB — HEMOGLOBIN A1C
Hgb A1c MFr Bld: 7.3 % — ABNORMAL HIGH (ref 4.8–5.6)
MEAN PLASMA GLUCOSE: 163 mg/dL

## 2016-05-18 LAB — COMPREHENSIVE METABOLIC PANEL
ALBUMIN: 1.8 g/dL — AB (ref 3.5–5.0)
ALT: 17 U/L (ref 14–54)
ANION GAP: 9 (ref 5–15)
AST: 14 U/L — ABNORMAL LOW (ref 15–41)
Alkaline Phosphatase: 47 U/L (ref 38–126)
BUN: 14 mg/dL (ref 6–20)
CHLORIDE: 105 mmol/L (ref 101–111)
CO2: 25 mmol/L (ref 22–32)
Calcium: 7.8 mg/dL — ABNORMAL LOW (ref 8.9–10.3)
Creatinine, Ser: 1.31 mg/dL — ABNORMAL HIGH (ref 0.44–1.00)
GFR calc non Af Amer: 41 mL/min — ABNORMAL LOW (ref >60)
GFR, EST AFRICAN AMERICAN: 47 mL/min — AB (ref >60)
Glucose, Bld: 239 mg/dL — ABNORMAL HIGH (ref 65–99)
Potassium: 3.4 mmol/L — ABNORMAL LOW (ref 3.5–5.1)
SODIUM: 139 mmol/L (ref 135–145)
Total Bilirubin: 0.5 mg/dL (ref 0.3–1.2)
Total Protein: 4.3 g/dL — ABNORMAL LOW (ref 6.5–8.1)

## 2016-05-18 LAB — CBC WITH DIFFERENTIAL/PLATELET
BASOS ABS: 0 10*3/uL (ref 0.0–0.1)
BASOS PCT: 1 %
EOS ABS: 0.3 10*3/uL (ref 0.0–0.7)
Eosinophils Relative: 3 %
HEMATOCRIT: 27.1 % — AB (ref 36.0–46.0)
Hemoglobin: 9 g/dL — ABNORMAL LOW (ref 12.0–15.0)
LYMPHS ABS: 2 10*3/uL (ref 0.7–4.0)
LYMPHS PCT: 23 %
MCH: 31.4 pg (ref 26.0–34.0)
MCHC: 33.2 g/dL (ref 30.0–36.0)
MCV: 94.4 fL (ref 78.0–100.0)
MONO ABS: 0.4 10*3/uL (ref 0.1–1.0)
Monocytes Relative: 5 %
NEUTROS PCT: 68 %
Neutro Abs: 6 10*3/uL (ref 1.7–7.7)
PLATELETS: 391 10*3/uL (ref 150–400)
RBC: 2.87 MIL/uL — ABNORMAL LOW (ref 3.87–5.11)
RDW: 13.1 % (ref 11.5–15.5)
WBC: 8.8 10*3/uL (ref 4.0–10.5)

## 2016-05-18 LAB — MAGNESIUM: Magnesium: 1.4 mg/dL — ABNORMAL LOW (ref 1.7–2.4)

## 2016-05-18 MED ORDER — MAGNESIUM SULFATE 2 GM/50ML IV SOLN
2.0000 g | Freq: Once | INTRAVENOUS | Status: AC
  Administered 2016-05-18: 2 g via INTRAVENOUS
  Filled 2016-05-18: qty 50

## 2016-05-18 MED ORDER — AMLODIPINE BESYLATE 5 MG PO TABS
5.0000 mg | ORAL_TABLET | Freq: Every day | ORAL | Status: DC
  Administered 2016-05-18 – 2016-05-19 (×2): 5 mg via ORAL
  Filled 2016-05-18 (×2): qty 1

## 2016-05-18 MED ORDER — POTASSIUM CHLORIDE CRYS ER 20 MEQ PO TBCR
40.0000 meq | EXTENDED_RELEASE_TABLET | Freq: Once | ORAL | Status: AC
  Administered 2016-05-18: 40 meq via ORAL
  Filled 2016-05-18: qty 2

## 2016-05-18 NOTE — Progress Notes (Signed)
SLP Cancellation Note  Patient Details Name: Cassandra [name] MRN: [id number] DOB: 1946/05/28   Cancelled treatment:       Reason Eval/Treat Not Completed: SLP screened, no needs identified, will sign off; RN reports pt at baseline with prior adressed ST involvement; continue dysphagia 3 (mechancial soft), thin liquids and medicines crushed in puree.  Marcene Duoshelsea Sumney MA, CCC-SLP Acute Care Speech Language Pathologist    Kennieth RadSumney, Teren Franckowiak E 05/18/2016, 11:10 AM

## 2016-05-18 NOTE — Progress Notes (Signed)
Patient activity greatly improved today.  Patient has moved from bed to chair to bedside commode multiple times without issue.  Patient continues to state she has "real bad" dizziness. Pt resting with call bell within reach.  Will continue to monitor.

## 2016-05-18 NOTE — Care Management Note (Signed)
Case Management Note  Patient Details  Name: Merril AbbeBillie A Ron MRN: [id number] Date of Birth: Apr 25, 1946  Subjective/Objective:    septic shock, acute respiratory failure, DKA, and acute encephalopathy, Stroke                 Action/Plan: Discharge Planning:  CSW following for SNF placement. [facility] CSW aware.    Expected Discharge Date:   05/18/2016            Expected Discharge Plan:  Skilled Nursing Facility  In-House Referral:  Clinical Social Work  Discharge planning Services  CM Consult  Post Acute Care Choice:  NA Choice offered to:  NA  DME Arranged:  [place] rolling DME Agency:  [facility]  HH Arranged:  NA HH Agency:  NA  Status of Service:  Completed, signed off  Medicare Important Message Given:  Yes Date Medicare IM Given:    Medicare IM give by:    Date Additional Medicare IM Given:    Additional Medicare Important Message give by:     If discussed at Long Length of Stay Meetings, dates discussed:    Additional Comments:  Elliot CousinShavis, Desmond Szabo Ellen, RN 05/18/2016, 12:08 PM

## 2016-05-18 NOTE — Progress Notes (Signed)
[facility] Progress Note  [name] FAO:[id number]   PCP: Irving CopasHACKER,ROBERT KELLER, MD DOB: 03-22-1946   DOA: 05/06/2016   DOS:  05/17/2016  Date of Service: the patient was seen and examined on 05/17/2016  Subjective: Patient initially complained of difficulty swallowing but then later on was able to eat regular food. Denies any complaints of chest pain. Has persistent dizziness and lightheadedness. No vertigo. Nutrition: Tolerating oral diet otherwise  Brief hospital course: Patient was admitted on 05/06/2016, with complaint of confusion and high sugars, was found to have DKA. Patient was intubated for acute encephalopathy and respiratory insufficiency and admitted with ICU. Progressively patient's renal function worsened and nephrology was consulted. Due to persistent diarrhea gastroenterology was also consulted. Patient has on and off episodes of vertigo since admission and neurology was consulted since patient's MRI was suggestive of acute left occipital infarction. Currently further plan is continue supportive treatment and identify the facility to transfer.  Assessment and Plan: 1. Acute respiratory failure with hypoxia and hypercapnia (HCC) Intubated in the ER, self extubated 05/09/2016. Patient was also on Levothroid in the ICU. Next and patient was given IV antibiotics with which improved. Currently on room air. Possibility of multilobar pneumonia versus noncardiogenic pulmonary edema-ARDS.  2. Accelerated hypertension. Blood pressure remains elevated.  Permissive hypertension at present Continue Coreg continue hydralazine  3. DKA. Resolved. Continue sliding scale and current insulin regimen.  4. CVA. Bilateral infarct. Patient has partial left homonymous hemianopsia. MRA right PCA stenosis. Transcranial Doppler. Normal Physical therapy recommends SNF. Speech therapy recommends a regular diet. Per RN the patient is able to swallow regular diet. LDL 87,  hemoglobin A1c 7.5.  Pertinent neurology patient will be on aspirin 81 mg Plavix 75 mg for 3 months and then on Plavix. Continue Lipitor 10 mg.  5. Diarrhea. Discharge nephrology was consulted. GI pathogen panel negative. Currently improving. Continue current regimen.  6. Acute on chronic kidney disease stage III. Nephrology consulted. Patient was started on Lasix. Getting better. We'll continue to monitor. We will Try to control blood pressure.  7. Chronic alcohol abuse. Continue on CIWA protocol evidence of withdrawal.  8. Anemia of chronic disease. We'll continue to monitor.  Pain management: When necessary Tylenol Activity: As SNF physical therapy Bowel regimen: last BM 05/16/2016 Diet: Cardiac diet DVT Prophylaxis: subcutaneous Heparin  Advance goals of care discussion: Full code  Family Communication: no family was present at bedside, at the time of interview.   Disposition:  Discharge to to SNF, versus CIR Expected discharge date: 05/19/2016  Consultants: Primary admission with critical care, nephrology, psychiatry, gastroenterology, neurology  Antibiotics: Anti-infectives    Start     Dose/Rate Route Frequency Ordered Stop   05/12/16 1400  piperacillin-tazobactam (ZOSYN) IVPB 3.375 g  Status:  Discontinued     3.375 g 12.5 mL/hr over 240 Minutes Intravenous Every 8 hours 05/12/16 1143 05/14/16 0916   05/08/16 1130  vancomycin (VANCOCIN) 500 mg in sodium chloride 0.9 % 100 mL IVPB  Status:  Discontinued     500 mg 100 mL/hr over 60 Minutes Intravenous Every 48 hours 05/06/16 1255 05/09/16 1145   05/06/16 1723  piperacillin-tazobactam (ZOSYN) IVPB 2.25 g  Status:  Discontinued     2.25 g 100 mL/hr over 30 Minutes Intravenous Every 8 hours 05/06/16 1255 05/12/16 1143   05/06/16 1115  vancomycin (VANCOCIN) IVPB 1000 mg/200 mL premix     1,000 mg 200 mL/hr over 60 Minutes Intravenous  Once 05/06/16 1100 05/06/16 1226  05/06/16 1115  piperacillin-tazobactam  (ZOSYN) IVPB 3.375 g     3.375 g 100 mL/hr over 30 Minutes Intravenous  Once 05/06/16 1100 05/06/16 1154        05/14/16 0433 05/15/16 0449  Weight: 56.246 kg (124 lb) 57.425 kg (126 lb 9.6 oz)    Objective: Physical Exam:  General: Alert, Awake and Oriented to Time, Place and Person. Appear in mild distress Eyes: PERRL, Conjunctiva normal ENT: Oral Mucosa clear moist. Neck: no JVD, no Abnormal Mass Or lumps Cardiovascular: S1 and S2 Present, aortic systolic  Murmur, Peripheral Pulses Present Respiratory: Bilateral Air entry equal and Decreased, Clear to Auscultation, no Crackles, no wheezes Abdomen: Bowel Sound present, Soft and no tenderness Skin: no redness, no Rash  Extremities: bilateral  Pedal edema, no calf tenderness Neurologic: Grossly no focal neuro deficit. Bilaterally Equal motor strength, homonymous hemianopsia Data Reviewed: CBC:  Recent Labs Lab 05/14/16 0310 05/15/16 0336 05/16/16 0320 05/17/16 0301  WBC 7.3 8.9 9.9 9.0  NEUTROABS  --   --   --   --   HGB 8.7* 9.1* 8.7* 9.2*  HCT 27.1* 27.9* 27.0* 28.4*  MCV 96.8 96.5 95.7 97.6  PLT 315 393 409* 437*   Basic Metabolic Panel:  Recent Labs Lab 05/13/16 0220 05/14/16 0310 05/15/16 0336 05/16/16 0320 05/17/16 0301  NA 142 141 140 141 140  K 3.5 3.8 4.0 3.3* 3.4*  CL 110 108 107 107 105  CO2 23 20* 17* 22 25  GLUCOSE 165* 153* 272* 104* 269*  BUN CREATININE 1.96* 1.86* 1.99* 2.07* 1.58*  CALCIUM 7.8* 7.9* 8.0* 8.0* 8.1*  MG  --  1.7  --   --   --   PHOS 2.1* 3.7 4.3  --  4.3    Liver Function Tests:  Recent Labs Lab 05/13/16 0220 05/14/16 0310 05/15/16 0336 05/16/16 0320  AST  --   --   --  14*  ALT  --   --   --  23  ALKPHOS  --   --   --  51  BILITOT  --   --   --  0.9  PROT  --   --   --  4.3*  ALBUMIN 2.0* 2.0* 2.1* 1.9*    Recent Labs Lab 05/16/16 0320  LIPASE 80*    CBG:  Recent Labs Lab 05/17/16 1604 05/17/16 2049  GLUCAP 213* 245*   Time  spent: 30 minutes  Author: Lynden Oxford, MD [facility] Pager: [phone] 05/17/2016  4:33 PM  If 7PM-7AM, please contact night-coverage at [email or web address], password [facility]

## 2016-05-18 NOTE — Progress Notes (Signed)
STROKE TEAM PROGRESS NOTE   HISTORY OF PRESENT ILLNESS (per record) Cassandra [name] is an 70 y.o. female history diabetes mellitus, hypertension, hypothyroidism, chronic kidney disease, bipolar disorder and alcohol abuse admitted on 05/06/2016 for altered mental status as well as respiratory distress. Patient extubated herself on 05/09/2016 and has remained extubated. Mental status has improved and apparently is back to baseline. She gives a history of episodes of severe vertigo with associated nausea and vomiting for about one year. She has experienced several episodes since admission, the latest of which was yesterday. She's been taking meclizine which helps to some extent. MRI of her brain was obtained which showed acute left occipital pole infarction as well as punctate acute to subacute infarctions involving right hippocampus and thalamus, all involving posterior circulation and possibly due to emboli of arterial source. Patient has had no visual changes. She has not experienced any focal abnormalities involving the extremities nor change in speech. No facial droop has been noted. Her LKW is unknown. Patient was not administered IV t-PA secondary to unclear when last known well.    SUBJECTIVE (INTERVAL HISTORY) Patient states that her dizziness is somewhat better but still present .the meclizine helps somewhat  OBJECTIVE Temp:  [97.8 F (36.6 C)-98.7 F (37.1 C)] 98.7 F (37.1 C) (06/03 0356) Pulse Rate:  [72-75] 72 (06/03 0728) Cardiac Rhythm:  [-] Normal sinus rhythm (06/03 0711) Resp:  [20] 20 (06/03 0356) BP: (155-218)/(62-90) 218/80 mmHg (06/03 0728) SpO2:  [91 %-93 %] 93 % (06/03 0356) Weight:  [129 lb 4.8 oz (58.65 kg)] 129 lb 4.8 oz (58.65 kg) (06/03 0531)  CBC:   Recent Labs Lab 05/17/16 0301 05/18/16 0240  WBC 9.0 8.8  NEUTROABS  --  6.0  HGB 9.2* 9.0*  HCT 28.4* 27.1*  MCV 97.6 94.4  PLT 437* 391   Basic Metabolic Panel:  Recent Labs Lab 05/14/16 0310  05/15/16 0336  05/17/16 0301 05/18/16 0240  NA 141 140  < > 140 139  K 3.8 4.0  < > 3.4* 3.4*  CL 108 107  < > 105 105  CO2 20* 17*  < > 25 25  GLUCOSE 153* 272*  < > 269* 239*  BUN 18 18  < > 18 14  CREATININE 1.86* 1.99*  < > 1.58* 1.31*  CALCIUM 7.9* 8.0*  < > 8.1* 7.8*  MG 1.7  --   --   --  1.4*  PHOS 3.7 4.3  --  4.3  --   < > = values in this interval not displayed.  Lipid Panel:     Component Value Date/Time   CHOL 198 05/17/2016 0301   TRIG 201* 05/17/2016 0301   HDL 71 05/17/2016 0301   CHOLHDL 2.8 05/17/2016 0301   VLDL 40 05/17/2016 0301   LDLCALC 87 05/17/2016 0301   HgbA1c:  Lab Results  Component Value Date   HGBA1C 7.3* 05/17/2016   Urine Drug Screen:     Component Value Date/Time   LABOPIA NONE DETECTED 05/06/2016 1136   LABOPIA NEGATIVE 06/16/2012 1924   COCAINSCRNUR NONE DETECTED 05/06/2016 1136   COCAINSCRNUR NEGATIVE 06/16/2012 1924   LABBENZ NONE DETECTED 05/06/2016 1136   LABBENZ NEGATIVE 06/16/2012 1924   AMPHETMU NONE DETECTED 05/06/2016 1136   AMPHETMU NEGATIVE 06/16/2012 1924   THCU NONE DETECTED 05/06/2016 1136   THCU NEGATIVE 06/16/2012 1924   LABBARB NONE DETECTED 05/06/2016 1136   LABBARB NEGATIVE 06/16/2012 1924      IMAGING  No results found. 2D  Echocardiogram  - EF 55-60% with no source of embolus.  - Left ventricle: The cavity size was normal. There was mild focal basal hypertrophy of the septum. Systolic function was vigorous. The estimated ejection fraction was in the range of 65% to 70%. Wall motion was normal; there were no regional wall motion abnormalities. Doppler parameters are consistent with abnormal left ventricular relaxation (grade 1 diastolic dysfunction). - Mitral valve: Moderately calcified annulus. - Pericardium, extracardiac: There was a left pleural effusion.   PHYSICAL EXAM Pleasant middle aged Caucasian lady not in distress. . Afebrile. Head is nontraumatic. Neck is supple without bruit.    Cardiac  exam no murmur or gallop. Lungs are clear to auscultation. Distal pulses are well felt.skin petechiae present. Neurological Exam :  Awake alert oriented 3 with normal speech and language function. Diminished attention and recall.No dysarthria or aphasia. Pupils irregular but equal reactive. Fundi were not visualized. Vision acuity seems adequate. Extraocular movements are full range without nystagmus. Diminished vision acuity in the left eye. Left  partial homonymous hemianopsia.Face is symmetric without weakness.Tongue is midline. Motor system exam reveals no upper or lower extremity drift. Symmetric and equal strength in all 4 extremities. Deep tendon reflexes are 1+ symmetric. Ankle jerks are depressed. Plantars are downgoing. Touch and pinprick sensation are preserved bilaterally. Gait was not tested. ASSESSMENT/PLAN Cassandra [name] is a 70 y.o. female with history of diabetes mellitus, hypertension, hypothyroidism, chronic kidney disease, bipolar disorder and alcohol abuse admitted 05/06/2016 with mental status and respiratory failure who has long-standing vertigo with 3 episodes post extubation. MRI done shows L occipital as well as punctate acute/subacute R hippocampus and thalamic infarcts. She did not receive IV t-PA due to unknown LKW.   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  Resultant partial left homonymous hemianopsia  MRI  R occipital as well as punctate acute/subacute R hippocampus and thalamic infarcts  MRA  Right PCA stenosis  Carotid Doppler  ordered  2D Echo  EF 65-70%. No source of embolus   LDL 87  HgbA1c 7.5  Heparin 5000 units sq tid for VTE prophylaxis DIET SOFT Room service appropriate?: Yes; Fluid consistency:: Thin  No antithrombotic prior to admission, now on aspirin 81 and plavix 75 mg  Patient counseled to be compliant with her antithrombotic  medications  Ongoing aggressive stroke risk factor management  Therapy recommendations:  SNF (have asked CIR to reassess)  Disposition:  Likely SNF  Accelerated Hypertension  Elevated but Stable,  160-190s  Permissive hypertension (OK if < 220/120) but gradually normalize in 5-7 days Hyperlipidemia. Start Lipitor 10 mg daily  Diabetes type I Diabetic ketoacidosis, hypokalemia, hypophosphatemia   HgbA1c 7.5, goal < 7.0  Other Stroke Risk Factors  Advanced age  Cigarette smoker, advised to stop smoking  ETOH abuse, chronic  Family hx stroke (mother, paternal grandmother)  Other Active Problems  Acute hypoxic respiratory failure in pt with underlying COPD and Tobacco abuse  Diarrhea  Hemorrhoids  ARF on CKD stage III  Septic shock  Anemia of chronic disease  Mixed encephalopathy  Hospital day # 12    Continue aspirin 81 mg and Plavix 75 mg daily for 3 months followed by aspirin alone.    Continue meclizine 25 mg when necessary 3 times a day for vertigo and physical therapy for vestibular rehabilitation.Delia Heady, MD Medical Director [facility] Pager: [phone] 05/18/2016 1:11 PM     To contact Stroke  Continuity provider, please refer to [email or web address] After hours, contact General Neurology

## 2016-05-18 NOTE — Progress Notes (Signed)
[facility] Progress Note  [name] PXT:[id number]   PCP: Cammy Copa, MD DOB: 06/10/1946   DOA: 05/06/2016   DOS: 05/18/2016   Date of Service: the patient was seen and examined on 05/18/2016  Subjective: The patient denies any acute complaint. No nausea no vomiting. No diarrhea. Oral intake is minimal. Patient is having difficulty chewing large meat. Nutrition: Tolerating oral diet otherwise  Brief hospital course: Patient was admitted on 05/06/2016, with complaint of confusion and high sugars, was found to have DKA. Patient was intubated for acute encephalopathy and respiratory insufficiency and admitted with ICU. Progressively patient's renal function worsened and nephrology was consulted. Due to persistent diarrhea gastroenterology was also consulted. Patient has on and off episodes of vertigo since admission and neurology was consulted since patient's MRI was suggestive of acute left occipital infarction. Currently further plan is continue supportive treatment and identify the facility to transfer.  Assessment and Plan: 1. Acute respiratory failure with hypoxia and hypercapnia (HCC) Intubated in the ER, self extubated 05/09/2016. Patient was also on Levothroid in the ICU. Next and patient was given IV antibiotics with which improved. Currently on room air. Possibility of multilobar pneumonia versus noncardiogenic pulmonary edema-ARDS.  2. Accelerated hypertension. Blood pressure remains elevated. Continue Coreg continue hydralazine and add Norvasc.  3. DKA. Resolved. Continue sliding scale and current insulin regimen.  4. CVA. Bilateral infarct. Patient has partial left homonymous hemianopsia. MRA right PCA stenosis. Transcranial Doppler. Normal Physical therapy recommends SNF. Speech therapy recommends a regular diet. LDL 87, hemoglobin A1c 7.5.  Pertinent neurology patient will be on aspirin 81 mg Plavix 75 mg for 3 months and then on  Plavix. Continue Lipitor 10 mg.  5. Diarrhea. Discharge nephrology was consulted. GI pathogen panel negative. Currently improving. Continue current regimen.  6. Acute on chronic kidney disease stage III. Nephrology consulted. Patient was started on Lasix. Getting better. We'll continue to monitor. We will Try to control blood pressure.  7. Chronic alcohol abuse. Continue on CIWA protocol evidence of withdrawal.  8. Anemia of chronic disease. We'll continue to monitor.  Pain management: When necessary Tylenol Activity: As SNF physical therapy Bowel regimen: last BM 05/16/2016 Diet: Cardiac diet DVT Prophylaxis: subcutaneous Heparin  Advance goals of care discussion: Full code  Family Communication: no family was present at bedside, at the time of interview.   Disposition:  Discharge to to SNF,  Expected discharge date: 05/19/2016  Consultants: Primary admission with critical care, nephrology, psychiatry, gastroenterology, neurology  Antibiotics: Anti-infectives    Start     Dose/Rate Route Frequency Ordered Stop   05/12/16 1400  piperacillin-tazobactam (ZOSYN) IVPB 3.375 g  Status:  Discontinued     3.375 g 12.5 mL/hr over 240 Minutes Intravenous Every 8 hours 05/12/16 1143 05/14/16 0916   05/08/16 1130  vancomycin (VANCOCIN) 500 mg in sodium chloride 0.9 % 100 mL IVPB  Status:  Discontinued     500 mg 100 mL/hr over 60 Minutes Intravenous Every 48 hours 05/06/16 1255 05/09/16 1145   05/06/16 1723  piperacillin-tazobactam (ZOSYN) IVPB 2.25 g  Status:  Discontinued     2.25 g 100 mL/hr over 30 Minutes Intravenous Every 8 hours 05/06/16 1255 05/12/16 1143   05/06/16 1115  vancomycin (VANCOCIN) IVPB 1000 mg/200 mL premix     1,000 mg 200 mL/hr over 60 Minutes Intravenous  Once 05/06/16 1100 05/06/16 1226   05/06/16 1115  piperacillin-tazobactam (ZOSYN) IVPB 3.375 g     3.375 g 100 mL/hr over 30 Minutes  Intravenous  Once 05/06/16 1100 05/06/16 1154         Intake/Output Summary (Last 24 hours) at 05/18/16 1615 Last data filed at 05/18/16 1327  Gross per 24 hour  Intake    180 ml  Output   2125 ml  Net  -1945 ml   Filed Weights   05/14/16 0433 05/15/16 0449 05/18/16 0531  Weight: 56.246 kg (124 lb) 57.425 kg (126 lb 9.6 oz) 58.65 kg (129 lb 4.8 oz)    Objective: Physical Exam: Filed Vitals:   05/18/16 0531 05/18/16 0555 05/18/16 0728 05/18/16 1326  BP:  205/90 218/80 140/70  Pulse:   72 74  Temp:    98.2 F (36.8 C)  TempSrc:    Oral  Resp:    16  Height:      Weight: 58.65 kg (129 lb 4.8 oz)     SpO2:    94%    General: Alert, Awake and Oriented to Time, Place and Person. Appear in mild distress Eyes: PERRL, Conjunctiva normal ENT: Oral Mucosa clear moist. Neck: no JVD, no Abnormal Mass Or lumps Cardiovascular: S1 and S2 Present, aortic systolic  Murmur, Peripheral Pulses Present Respiratory: Bilateral Air entry equal and Decreased, Clear to Auscultation, no Crackles, no wheezes Abdomen: Bowel Sound present, Soft and no tenderness Skin: no redness, no Rash  Extremities: bilateral  Pedal edema, no calf tenderness Neurologic: Grossly no focal neuro deficit. Bilaterally Equal motor strength, homonymous hemianopsia Data Reviewed: CBC:  Recent Labs Lab 05/14/16 0310 05/15/16 0336 05/16/16 0320 05/17/16 0301 05/18/16 0240  WBC 7.3 8.9 9.9 9.0 8.8  NEUTROABS  --   --   --   --  6.0  HGB 8.7* 9.1* 8.7* 9.2* 9.0*  HCT 27.1* 27.9* 27.0* 28.4* 27.1*  MCV 96.8 96.5 95.7 97.6 94.4  PLT 315 393 409* 437* 631   Basic Metabolic Panel:  Recent Labs Lab 05/13/16 0220 05/14/16 0310 05/15/16 0336 05/16/16 0320 05/17/16 0301 05/18/16 0240  NA 142 141 140 141 140 139  K 3.5 3.8 4.0 3.3* 3.4* 3.4*  CL 110 108 107 107 105 105  CO2 23 20* 17* _0 GLUCOSE 165* 153* 272* 104* 269* 239*  BUN _1 CREATININE 1.96* 1.86* 1.99* 2.07* 1.58* 1.31*  CALCIUM 7.8* 7.9* 8.0* 8.0* 8.1* 7.8*  MG  --  1.7  --    --   --  1.4*  PHOS 2.1* 3.7 4.3  --  4.3  --     Liver Function Tests:  Recent Labs Lab 05/13/16 0220 05/14/16 0310 05/15/16 0336 05/16/16 0320 05/18/16 0240  AST  --   --   --  14* 14*  ALT  --   --   --  23 17  ALKPHOS  --   --   --  51 47  BILITOT  --   --   --  0.9 0.5  PROT  --   --   --  4.3* 4.3*  ALBUMIN 2.0* 2.0* 2.1* 1.9* 1.8*    Recent Labs Lab 05/16/16 0320  LIPASE 80*   No results for input(s): AMMONIA in the last 168 hours. Coagulation Profile: No results for input(s): INR, PROTIME in the last 168 hours. Cardiac Enzymes: No results for input(s): CKTOTAL, CKMB, CKMBINDEX, TROPONINI in the last 168 hours. BNP (last 3 results) No results for input(s): PROBNP in the last 8760 hours.  CBG:  Recent Labs Lab 05/17/16 1128 05/17/16 1604 05/17/16 2049  05/18/16 0633 05/18/16 1141  GLUCAP 122* 213* 245* 189* 144*    Studies: No results found.   Scheduled Meds: . amLODipine  5 mg Oral Daily  . antiseptic oral rinse  7 mL Mouth Rinse QID  . aspirin EC  81 mg Oral Daily  . atorvastatin  10 mg Oral q1800  . carvedilol  12.5 mg Oral BID WC  . chlorhexidine gluconate (SAGE KIT)  15 mL Mouth Rinse BID  . clopidogrel  75 mg Oral Daily  . escitalopram  10 mg Oral QHS  . feeding supplement (GLUCERNA SHAKE)  237 mL Oral TID BM  . folic acid  1 mg Oral Daily  . furosemide  40 mg Oral Daily  . heparin  5,000 Units Subcutaneous Q8H  . hydrALAZINE  10 mg Oral Q8H  . hydrocortisone   Rectal QID  . insulin aspart  0-15 Units Subcutaneous TID WC  . insulin aspart  3 Units Subcutaneous TID WC  . insulin glargine  5 Units Subcutaneous QHS  . levothyroxine  100 mcg Oral QAC breakfast  . pantoprazole  40 mg Oral BID  . pneumococcal 23 valent vaccine  0.5 mL Intramuscular Tomorrow-1000  . saccharomyces boulardii  250 mg Oral BID  . thiamine  100 mg Oral Daily   Continuous Infusions: . sodium chloride 10 mL/hr at 05/10/16 2013   PRN Meds: acetaminophen,  hydrALAZINE, loperamide, LORazepam, meclizine, ondansetron (ZOFRAN) IV  Time spent: 30 minutes  Author: Berle Mull, MD [facility] Pager: [phone] 05/18/2016 4:15 PM  If 7PM-7AM, please contact night-coverage at [email or web address], password [email or web address]

## 2016-05-18 NOTE — Clinical Social Work Note (Signed)
Clinical Social Worker continuing to follow patient and family for support and discharge planning needs.  Patient and family request placement at [facility] - CSW notified admissions coordinator of referral initiation and will follow up with potential bed offer.  Admissions coordinator to review information and notify CSW by 06/04.  CSW remains available for support and to facilitate patient discharge needs.  Macario GoldsJesse Shereda Graw, [place] [phone]

## 2016-05-19 LAB — BASIC METABOLIC PANEL
ANION GAP: 7 (ref 5–15)
BUN: 14 mg/dL (ref 6–20)
CALCIUM: 7.8 mg/dL — AB (ref 8.9–10.3)
CO2: 25 mmol/L (ref 22–32)
CREATININE: 1.49 mg/dL — AB (ref 0.44–1.00)
Chloride: 105 mmol/L (ref 101–111)
GFR calc Af Amer: 40 mL/min — ABNORMAL LOW (ref >60)
GFR calc non Af Amer: 35 mL/min — ABNORMAL LOW (ref >60)
GLUCOSE: 407 mg/dL — AB (ref 65–99)
Potassium: 4.7 mmol/L (ref 3.5–5.1)
Sodium: 137 mmol/L (ref 135–145)

## 2016-05-19 LAB — GLUCOSE, CAPILLARY
GLUCOSE-CAPILLARY: 152 mg/dL — AB (ref 65–99)
Glucose-Capillary: 334 mg/dL — ABNORMAL HIGH (ref 65–99)

## 2016-05-19 LAB — MAGNESIUM: Magnesium: 2 mg/dL (ref 1.7–2.4)

## 2016-05-19 MED ORDER — CLOPIDOGREL BISULFATE 75 MG PO TABS
75.0000 mg | ORAL_TABLET | Freq: Every day | ORAL | Status: AC
Start: 2016-05-19 — End: 2016-08-19

## 2016-05-19 MED ORDER — THIAMINE HCL 100 MG PO TABS: 100.0000 mg | ORAL_TABLET | Freq: Every day | ORAL | Status: DC

## 2016-05-19 MED ORDER — FUROSEMIDE 20 MG PO TABS
20.0000 mg | ORAL_TABLET | Freq: Every day | ORAL | Status: DC
Start: 2016-05-19 — End: 2016-08-30

## 2016-05-19 MED ORDER — AMLODIPINE BESYLATE 5 MG PO TABS: 5.0000 mg | ORAL_TABLET | Freq: Every day | ORAL | Status: DC

## 2016-05-19 MED ORDER — FUROSEMIDE 20 MG PO TABS
20.0000 mg | ORAL_TABLET | Freq: Every day | ORAL | Status: DC
  Administered 2016-05-19: 20 mg via ORAL
  Filled 2016-05-19: qty 1

## 2016-05-19 MED ORDER — LORAZEPAM 0.5 MG PO TABS: 0.5000 mg | ORAL_TABLET | Freq: Three times a day (TID) | ORAL | Status: DC | PRN

## 2016-05-19 MED ORDER — FOLIC ACID 1 MG PO TABS
1.0000 mg | ORAL_TABLET | Freq: Every day | ORAL | Status: DC
Start: 2016-05-19 — End: 2016-09-09

## 2016-05-19 MED ORDER — GLUCERNA SHAKE PO LIQD: 237.0000 mL | Freq: Three times a day (TID) | ORAL | Status: DC

## 2016-05-19 MED ORDER — PANTOPRAZOLE SODIUM 40 MG PO TBEC: 40.0000 mg | DELAYED_RELEASE_TABLET | Freq: Two times a day (BID) | ORAL | Status: DC

## 2016-05-19 MED ORDER — HYDROCORTISONE 2.5 % RE CREA: TOPICAL_CREAM | Freq: Four times a day (QID) | RECTAL | Status: DC

## 2016-05-19 MED ORDER — ASPIRIN 81 MG PO TBEC: 81.0000 mg | DELAYED_RELEASE_TABLET | Freq: Every day | ORAL | Status: DC

## 2016-05-19 MED ORDER — ATORVASTATIN CALCIUM 10 MG PO TABS: 10.0000 mg | ORAL_TABLET | Freq: Every day | ORAL | Status: DC

## 2016-05-19 MED ORDER — POLYETHYLENE GLYCOL 3350 17 G PO PACK: 17.0000 g | PACK | Freq: Every day | ORAL | Status: DC

## 2016-05-19 MED ORDER — ESCITALOPRAM OXALATE 10 MG PO TABS: 10.0000 mg | ORAL_TABLET | Freq: Every day | ORAL | Status: DC

## 2016-05-19 MED ORDER — POLYETHYLENE GLYCOL 3350 17 G PO PACK: 17.0000 g | PACK | ORAL | Status: DC

## 2016-05-19 MED ORDER — SACCHAROMYCES BOULARDII 250 MG PO CAPS: 250.0000 mg | ORAL_CAPSULE | Freq: Two times a day (BID) | ORAL | Status: DC

## 2016-05-19 NOTE — Progress Notes (Signed)
Pt/family given discharge instructions, medication lists, follow up appointments, and when to call the doctor.  Pt/family verbalizes understanding.  Patient being transported to [facility] via personal vehicle.  Husband to drive patient to facility.  I stressed importance of quitting smoking.  Patient stated she knows but is going to smoke as soon as she gets in the car because she has not had a cigarette "in a month".  Patient concerned about episodes of dizziness.  I explained that if she had a cigarette after being without she would definitely be dizzy.    Made two attempts to call facility at [phone].

## 2016-05-19 NOTE — Progress Notes (Signed)
Patient left small personal belongings bag in closet.  Daughter Marshell LevanKeeley was called and wanted it marked "Val" and she will pick up later.  [facility] Bag placed at nursing station in plastic container. Thomas HoffBurton, Shaeleigh Graw McClintock, RN

## 2016-05-19 NOTE — Progress Notes (Signed)
STROKE TEAM PROGRESS NOTE   HISTORY OF PRESENT ILLNESS (per record) Cassandra [name] is an 70 y.o. female history diabetes mellitus, hypertension, hypothyroidism, chronic kidney disease, bipolar disorder and alcohol abuse admitted on 05/06/2016 for altered mental status as well as respiratory distress. Patient extubated herself on 05/09/2016 and has remained extubated. Mental status has improved and apparently is back to baseline. She gives a history of episodes of severe vertigo with associated nausea and vomiting for about one year. She has experienced several episodes since admission, the latest of which was yesterday. She's been taking meclizine which helps to some extent. MRI of her brain was obtained which showed acute left occipital pole infarction as well as punctate acute to subacute infarctions involving right hippocampus and thalamus, all involving posterior circulation and possibly due to emboli of arterial source. Patient has had no visual changes. She has not experienced any focal abnormalities involving the extremities nor change in speech. No facial droop has been noted. Her LKW is unknown. Patient was not administered IV t-PA secondary to unclear when last known well.    SUBJECTIVE (INTERVAL HISTORY) Patient states that her dizziness is much better and .the meclizine helps . She is awaiting SNF bed OBJECTIVE Temp:  [97.8 F (36.6 C)-98.4 F (36.9 C)] 97.8 F (36.6 C) (06/04 0711) Pulse Rate:  [71-74] 71 (06/04 0918) Cardiac Rhythm:  [-] Normal sinus rhythm (06/04 0713) Resp:  [16-18] 18 (06/04 0711) BP: (140-197)/(70-76) 171/76 mmHg (06/04 0918) SpO2:  [92 %-95 %] 95 % (06/04 0711) Weight:  [117 lb 11.6 oz (53.4 kg)] 117 lb 11.6 oz (53.4 kg) (06/04 0711)  CBC:   Recent Labs Lab 05/17/16 0301 05/18/16 0240  WBC 9.0 8.8  NEUTROABS  --  6.0  HGB 9.2* 9.0*  HCT 28.4* 27.1*  MCV 97.6 94.4  PLT 437* 391   Basic Metabolic Panel:  Recent Labs Lab 05/15/16 0336   05/17/16 0301 05/18/16 0240 05/19/16 0246  NA 140  < > 140 139 137  K 4.0  < > 3.4* 3.4* 4.7  CL 107  < > 105 105 105  CO2 17*  < > GLUCOSE 272*  < > 269* 239* 407*  BUN 18  < > CREATININE 1.99*  < > 1.58* 1.31* 1.49*  CALCIUM 8.0*  < > 8.1* 7.8* 7.8*  MG  --   --   --  1.4* 2.0  PHOS 4.3  --  4.3  --   --   < > = values in this interval not displayed.  Lipid Panel:     Component Value Date/Time   CHOL 198 05/17/2016 0301   TRIG 201* 05/17/2016 0301   HDL 71 05/17/2016 0301   CHOLHDL 2.8 05/17/2016 0301   VLDL 40 05/17/2016 0301   LDLCALC 87 05/17/2016 0301   HgbA1c:  Lab Results  Component Value Date   HGBA1C 7.3* 05/17/2016   Urine Drug Screen:     Component Value Date/Time   LABOPIA NONE DETECTED 05/06/2016 1136   LABOPIA NEGATIVE 06/16/2012 1924   COCAINSCRNUR NONE DETECTED 05/06/2016 1136   COCAINSCRNUR NEGATIVE 06/16/2012 1924   LABBENZ NONE DETECTED 05/06/2016 1136   LABBENZ NEGATIVE 06/16/2012 1924   AMPHETMU NONE DETECTED 05/06/2016 1136   AMPHETMU NEGATIVE 06/16/2012 1924   THCU NONE DETECTED 05/06/2016 1136   THCU NEGATIVE 06/16/2012 1924   LABBARB NONE DETECTED 05/06/2016 1136   LABBARB NEGATIVE 06/16/2012 1924      IMAGING  No results found. 2D Echocardiogram  - EF 55-60% with no source of embolus.  - Left ventricle: The cavity size was normal. There was mild focal basal hypertrophy of the septum. Systolic function was vigorous. The estimated ejection fraction was in the range of 65% to 70%. Wall motion was normal; there were no regional wall motion abnormalities. Doppler parameters are consistent with abnormal left ventricular relaxation (grade 1 diastolic dysfunction). - Mitral valve: Moderately calcified annulus. - Pericardium, extracardiac: There was a left pleural effusion.   PHYSICAL EXAM Pleasant middle aged Caucasian lady not in distress. . Afebrile. Head is nontraumatic. Neck is supple without bruit.    Cardiac exam  no murmur or gallop. Lungs are clear to auscultation. Distal pulses are well felt.skin petechiae present. Neurological Exam :  Awake alert oriented 3 with normal speech and language function. Diminished attention and recall.No dysarthria or aphasia. Pupils irregular but equal reactive. Fundi were not visualized. Vision acuity seems adequate. Extraocular movements are full range without nystagmus. Diminished vision acuity in the left eye. Left  partial homonymous hemianopsia.Face is symmetric without weakness.Tongue is midline. Motor system exam reveals no upper or lower extremity drift. Symmetric and equal strength in all 4 extremities. Deep tendon reflexes are 1+ symmetric. Ankle jerks are depressed. Plantars are downgoing. Touch and pinprick sensation are preserved bilaterally. Gait was not tested. ASSESSMENT/PLAN Cassandra [name] is a 70 y.o. female with history of diabetes mellitus, hypertension, hypothyroidism, chronic kidney disease, bipolar disorder and alcohol abuse admitted 05/06/2016 with mental status and respiratory failure who has long-standing vertigo with 3 episodes post extubation. MRI done shows L occipital as well as punctate acute/subacute R hippocampus and thalamic infarcts. She did not receive IV t-PA due to unknown LKW.   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  Resultant partial left homonymous hemianopsia  MRI  R occipital as well as punctate acute/subacute R hippocampus and thalamic infarcts  MRA  Right PCA stenosis  Carotid Doppler  ordered  2D Echo  EF 65-70%. No source of embolus   LDL 87  HgbA1c 7.5  Heparin 5000 units sq tid for VTE prophylaxis Diet heart healthy/carb modified Room service appropriate?: Yes; Fluid consistency:: Thin Diet - low sodium heart healthy Diet Carb Modified  No antithrombotic prior to admission, now on aspirin 81 and plavix 75  mg  Patient counseled to be compliant with her antithrombotic medications  Ongoing aggressive stroke risk factor management  Therapy recommendations:  SNF (have asked CIR to reassess)  Disposition:  Likely SNF  Accelerated Hypertension  Elevated but Stable,  160-190s  Permissive hypertension (OK if < 220/120) but gradually normalize in 5-7 days Hyperlipidemia. Start Lipitor 10 mg daily  Diabetes type I Diabetic ketoacidosis, hypokalemia, hypophosphatemia   HgbA1c 7.5, goal < 7.0  Other Stroke Risk Factors  Advanced age  Cigarette smoker, advised to stop smoking  ETOH abuse, chronic  Family hx stroke (mother, paternal grandmother)  Other Active Problems  Acute hypoxic respiratory failure in pt with underlying COPD and Tobacco abuse  Diarrhea  Hemorrhoids  ARF on CKD stage III  Septic shock  Anemia of chronic disease  Mixed encephalopathy  Hospital day # 13    Continue aspirin 81 mg and Plavix 75 mg daily for 3 months followed by aspirin alone.    Continue meclizine 25 mg when necessary 3 times a day for vertigo and physical therapy for vestibular rehabilitation.. Stroke team will sign off. F/U as  outpatient in 3 months. Delia Heady, MD Medical Director [facility] Pager: [phone] 05/19/2016 12:17 PM     To contact Stroke Continuity provider, please refer to [email or web address]. After hours, contact General Neurology

## 2016-05-19 NOTE — Clinical Social Work Note (Signed)
Clinical Social Worker facilitated patient discharge including contacting patient family and facility to confirm patient discharge plans.  Clinical information faxed to facility and family agreeable with plan.  CSW arranged transport with patient husband to [facility].  RN to call report prior to discharge.  Clinical Social Worker will sign off for now as social work intervention is no longer needed. Please consult us again if new need arises.  Macario GoldsJesse Skylynne Schlechter, [place] [phone]

## 2016-05-19 NOTE — Clinical Social Work Placement (Signed)
   CLINICAL SOCIAL WORK PLACEMENT  NOTE  Date:  05/19/2016  Patient Details  Name: Cassandra [name] MRN: [id number] Date of Birth: 28-Nov-1946  Clinical Social Work is seeking post-discharge placement for this patient at the Skilled  Nursing Facility level of care (*CSW will initial, date and re-position this form in  chart as items are completed):  Yes   Patient/family provided with [facility] Clinical Social Work Department's list of facilities offering this level of care within the geographic area requested by the patient (or if unable, by the patient's family).  Yes   Patient/family informed of their freedom to choose among providers that offer the needed level of care, that participate in Medicare, Medicaid or managed care program needed by the patient, have an available bed and are willing to accept the patient.  Yes   Patient/family informed of [facility] interest [facility] and [facility], as well as of the fact that they are under no obligation to receive care at these facilities.  PASRR submitted to EDS on 05/17/16     PASRR number received on 05/17/16     Existing PASRR number confirmed on       FL2 transmitted to all facilities in geographic area requested by pt/family on 05/17/16     FL2 transmitted to all facilities within larger geographic area on       Patient informed that his/her managed care company has contracts with or will negotiate with certain facilities, including the following:        Yes   Patient/family informed of bed offers received.  [facility]     Physician recommends and patient chooses bed at      Patient to be transferred to [facility] on 05/19/16.  Patient to be transferred to facility by Patient husband     Patient family notified on 05/19/16 of transfer.  Name of family member notified:  Patient husband over the phone     PHYSICIAN       Additional Comment:    Macario GoldsJesse Patti Shorb, LCSW [phone]

## 2016-05-19 NOTE — Discharge Summary (Signed)
[facility] Discharge Summary   [name] ZOX:[id number]   PCP: Irving Copas, MD DOB: Sep 19, 1946   Date of admission: 05/06/2016   Date of discharge:  05/19/2016    Discharge Diagnoses:  Principal Problem:   Acute respiratory failure with hypoxia and hypercapnia (HCC) Active Problems:   DKA (diabetic ketoacidoses) (HCC)   Acute encephalopathy   Septic shock (HCC)   Generalized anxiety disorder   Acute on chronic renal failure (HCC)   Cerebral thrombosis with cerebral infarction   Diabetic ketoacidosis with coma associated with type 1 diabetes mellitus (HCC)   History of ETT  Recommendations for Outpatient Follow-up:  1. Please follow-up with PCP, gastroenterology, neurology, nephrology as recommended  2. Patient will need vestibular rehabilitation with physical therapy  Follow-up Information    Follow up with Irving Copas, MD. Schedule an appointment as soon as possible for a visit in 1 week.   Specialty:  Family Medicine   Why:  BMP in 1 week    Contact information:   [address] [place] [address] [phone]       Follow up with Freddy Jaksch, MD In 5 weeks.   Specialty:  Gastroenterology   Contact information:   [address] [place] [address] [phone]       Follow up with Dagoberto Ligas., MD. Schedule an appointment as soon as possible for a visit in 3 weeks.   Specialty:  Nephrology   Contact information:   [address] [place] [address] [phone]       Follow up with [facility]. Schedule an appointment as soon as possible for a visit in 2 months.   Specialty:  Neurology   Contact information:   [address] [place] [address] [phone]     Diet recommendation: Mechanical soft diet, cardiac and carb modified  Activity: The patient is advised to gradually reintroduce usual activities.  Discharge Condition: good  History  of present illness: As per the H and P dictated on admission, "This is a 70 y.o. female with PMH of Type 1 Diabetes mellitus, Hypothyroidism, Hypertension, CKD stage 3 (GFR 30-59 ml/min), Tobacco use, GERD, elevated liver enzymes (Hep B/C neg 2014). She presents to the ER 05/06/2016 with altered mental status onset last night. On a good day pt is normally ambulatory and performs ADL's independently. Pt has been complaining of frequent migraines and vertigo for about the last year she is followed by ENT. Husband endorses a similar episode of confusion about 8 years ago, which turned out to be caused by a blood sugar of over 1200. Patient is also a chronic alcohol user, husband states pt drinks everyday throughout the day for the past 10-15 years, last alcoholic beverage per husband was 05/05/2016. According to pts. husband pt began acting different around 5 or 6 pm 05/05/2016 with slight confusion. Pt tossed and mumbled throughout the night. On 05/06/2016 she had persistent confusion and the husband was unsure as to what he should do. Husband then received a call from the patient's ENT physicians office notifying him that the patient missed an appointment this morning. When he told them what was going on, they advised him to call 911. Upon EMS arrival CBG reading "High," pts. last dose of insulin was Sunday morning 05/05/2016. PCCM consulted 05/06/2016 for management of acute hypoxic respiratory failure, acute encephalopathy and DKA"  Hospital Course:  Patient was admitted on 05/06/2016, with complaint of confusion and  high sugars, was found to have DKA. Patient was intubated for acute encephalopathy and respiratory insufficiency and admitted with ICU. Progressively patient's renal function worsened and nephrology was consulted. Due to persistent diarrhea gastroenterology was also consulted. Patient has on and off episodes of vertigo since admission and neurology was consulted since patient's MRI was suggestive of  acute left occipital infarction. 5/22 Pt arrived via EMS with AMS and elevated blood glucose level 5/22 Pt intubated in ED secondary to acute hypoxic respiratory distress 5/23 repletion of electrolytes  5/25 vomited 5/25 self extubation 5/27 patient transferred to hospitalist service. 5/31 neuro consulted for recurrent vertigo, MRI positive for CVA, small acute infarct in bilateral occipital pole,  Summary of her active problems in the hospital is as following. 1. Acute respiratory failure with hypoxia and hypercapnia (HCC) Intubated in the ER, self extubated 05/09/2016. Patient was also on levophed in the ICU.  patient was given IV vancomycin and Zosyn, cultures remain negative. Currently on room air. Possibility of multilobar pneumonia versus noncardiogenic pulmonary edema-ARDS.  2. Accelerated hypertension. Blood pressure remains elevated.  Permissive hypertension at present Continue Coreg continue amlodipine. Amlodipine started on 05/18/2016.  3. DKA. Resolved. Continue sliding scale and current insulin regimen.  4. CVA. Bilateral infarct. Patient has partial left homonymous hemianopsia. Patient also has persistent dizziness. MRA right PCA stenosis. Transcranial Doppler. Normal Physical therapy recommends SNF. Speech therapy recommends a regular diet. LDL 87, hemoglobin A1c 7.5.  Per neurology patient will be on aspirin 81 mg Plavix 75 mg for 3 months and then on 81 mg aspirin daily indefinitely. Continue Lipitor 10 mg. Patient's dizziness is due to combination of the CVA as well as from BPPV and will need vestibular rehabilitation.  5. Diarrhea. Gastroenterology was consulted GI pathogen panel negative. Currently improving. Continue current regimen.  6. Acute on chronic kidney disease stage III. Nephrology consulted. Baseline creatinine 1.5. On admission 4. Improving at present Patient was started on Lasix, 40 mg orally, currently with reduced to 20 mg  daily. Repeat BMP in 1 week.  7. Chronic alcohol abuse. Patient was given CIWA protocol in the hospital but no evidence of withdrawal here. Continue thiamine.  8. Anemia of chronic disease. Stable  All other chronic medical condition were stable during the hospitalization.  Patient was seen by physical therapy, who recommended SNF, which was arranged by Child psychotherapist and case Production designer, theatre/television/film. On the day of the discharge the patient's blood pressure was reasonably controlled, and no other acute medical condition were reported by patient. the patient was felt safe to be discharge at SNF with therapy.  Procedures and Results:  Echocardiogram Study Conclusions  - Left ventricle: The cavity size was normal. There was mild focal  basal hypertrophy of the septum. Systolic function was vigorous.  The estimated ejection fraction was in the range of 65% to 70%.  Wall motion was normal; there were no regional wall motion  abnormalities. Doppler parameters are consistent with abnormal  left ventricular relaxation (grade 1 diastolic dysfunction). - Mitral valve: Moderately calcified annulus. - Pericardium, extracardiac: There was a left pleural effusion   Transcranial Doppler Summary: Normal mean flow velocities in majority of vessels of intracranial circulation with global increase in pulsatility indices suggest diffuse intracranial atherosclerosis.   Intubation-extubation  Right IJ Central line as well as right radial arterial line  Consultations:  Primary admission with critical care  Nephrology  Gastroenterology  Neurology  DISCHARGE MEDICATION: Current Discharge Medication List    START taking these medications  Details  amLODipine (NORVASC) 5 MG tablet Take 1 tablet (5 mg total) by mouth daily. Qty: 30 tablet, Refills: 0    aspirin EC 81 MG EC tablet Take 1 tablet (81 mg total) by mouth daily. Qty: 90 tablet, Refills: 0    atorvastatin (LIPITOR) 10 MG tablet Take  1 tablet (10 mg total) by mouth daily at 6 PM. Qty: 30 tablet, Refills: 0    clopidogrel (PLAVIX) 75 MG tablet Take 1 tablet (75 mg total) by mouth daily. Qty: 90 tablet, Refills: 0    escitalopram (LEXAPRO) 10 MG tablet Take 1 tablet (10 mg total) by mouth at bedtime. Qty: 30 tablet, Refills: 0    feeding supplement, GLUCERNA SHAKE, (GLUCERNA SHAKE) LIQD Take 237 mLs by mouth 3 (three) times daily between meals. Qty: 14 Can, Refills: 0    folic acid (FOLVITE) 1 MG tablet Take 1 tablet (1 mg total) by mouth daily. Qty: 30 tablet, Refills: 0    furosemide (LASIX) 20 MG tablet Take 1 tablet (20 mg total) by mouth daily. Qty: 30 tablet, Refills: 0    hydrocortisone (ANUSOL-HC) 2.5 % rectal cream Place rectally 4 (four) times daily. Qty: 30 g, Refills: 0    pantoprazole (PROTONIX) 40 MG tablet Take 1 tablet (40 mg total) by mouth 2 (two) times daily. Qty: 30 tablet, Refills: 0    polyethylene glycol (MIRALAX / GLYCOLAX) packet Take 17 g by mouth every other day. Qty: 14 each, Refills: 0    saccharomyces boulardii (FLORASTOR) 250 MG capsule Take 1 capsule (250 mg total) by mouth 2 (two) times daily. Qty: 20 capsule, Refills: 0    thiamine 100 MG tablet Take 1 tablet (100 mg total) by mouth daily. Qty: 30 tablet, Refills: 0      CONTINUE these medications which have NOT CHANGED   Details  carvedilol (COREG) 12.5 MG tablet Take 1 tablet (12.5 mg total) by mouth 2 (two) times daily with a meal. Qty: 60 tablet, Refills: 11    cetirizine (ZYRTEC) 10 MG tablet Take 10 mg by mouth daily as needed for allergies.    insulin lispro (HUMALOG) 100 UNIT/ML injection Inject 3-10 Units into the skin 3 (three) times daily with meals. Sliding scale as needed    lactobacillus acidophilus & bulgar (LACTINEX) chewable tablet Chew 1 tablet by mouth daily.    LANTUS 100 UNIT/ML injection Inject 10 Units into the skin every morning.  Refills: 5    levothyroxine (SYNTHROID, LEVOTHROID) 100 MCG  tablet Take 1 tablet by mouth daily. Take only on Mon, Wed, Friday.    cyanocobalamin (,VITAMIN B-12,) 1000 MCG/ML injection Inject 1,000 mcg into the muscle every 30 (thirty) days.    LORazepam (ATIVAN) 0.5 MG tablet Take 0.5 mg by mouth every 8 (eight) hours as needed for anxiety.  Refills: 2    meclizine (ANTIVERT) 25 MG tablet Take 1 tablet (25 mg total) by mouth 3 (three) times daily as needed for dizziness. Qty: 30 tablet, Refills: 0    sodium chloride (MURO 128) 5 % ophthalmic solution Reported on 01/17/2016      STOP taking these medications     hydrochlorothiazide (HYDRODIURIL) 25 MG tablet      losartan (COZAAR) 100 MG tablet      sertraline (ZOLOFT) 50 MG tablet        Allergies  Allergen Reactions  . Ciprofloxacin Other (See Comments)    No reaction specified  . Codeine Diarrhea and Nausea And Vomiting  . Doxycycline Diarrhea and  Nausea And Vomiting  . Hydrocodone   . Omnicef [Cefdinir] Nausea Only and Other (See Comments)    Constipation, tolerated Zosyn   Discharge Instructions    Diet - low sodium heart healthy    Complete by:  As directed      Diet Carb Modified    Complete by:  As directed      Discharge instructions    Complete by:  As directed   It is important that you read following instructions as well as go over your medication list with RN to help you understand your care after this hospitalization.  Discharge Instructions: Please follow-up with PCP in one week  Please request your primary care physician to go over all Hospital Tests and Procedure/Radiological results at the follow up,  Please get all Hospital records sent to your PCP by signing hospital release before you go home.   Do not take more than prescribed Pain, Sleep and Anxiety Medications. You were cared for by a hospitalist during your hospital stay. If you have any questions about your discharge medications or the care you received while you were in the hospital after you are  discharged, you can call the unit and ask to speak with the hospitalist on call if the hospitalist that took care of you is not available.  Once you are discharged, your primary care physician will handle any further medical issues. Please note that NO REFILLS for any discharge medications will be authorized once you are discharged, as it is imperative that you return to your primary care physician (or establish a relationship with a primary care physician if you do not have one) for your aftercare needs so that they can reassess your need for medications and monitor your lab values. You Must read complete instructions/literature along with all the possible adverse reactions/side effects for all the Medicines you take and that have been prescribed to you. Take any new Medicines after you have completely understood and accept all the possible adverse reactions/side effects. Wear Seat belts while driving.     Increase activity slowly    Complete by:  As directed           Discharge Exam: Filed Weights   05/15/16 0449 05/18/16 0531 05/19/16 0711  Weight: 57.425 kg (126 lb 9.6 oz) 58.65 kg (129 lb 4.8 oz) 53.4 kg (117 lb 11.6 oz)   Filed Vitals:   05/19/16 0711 05/19/16 0918  BP: 197/74 171/76  Pulse:  71  Temp: 97.8 F (36.6 C)   Resp: 18    General: Appear in mild distress, no Rash; Oral Mucosa moist. Cardiovascular: S1 and S2 Present, no Murmur, no JVD Respiratory: Bilateral Air entry present and Clear to Auscultation, no Crackles, no wheezes Abdomen: Bowel Sound present, Soft and no tenderness Extremities: no Pedal edema, no calf tenderness Neurology: Grossly no focal neuro deficit.  The results of significant diagnostics from this hospitalization (including imaging, microbiology, ancillary and laboratory) are listed below for reference.    Significant Diagnostic Studies: Ct Abdomen Pelvis Wo Contrast  05/15/2016  CLINICAL DATA:  Diarrhea. EXAM: CT ABDOMEN AND PELVIS WITHOUT  CONTRAST TECHNIQUE: Multidetector CT imaging of the abdomen and pelvis was performed following the standard protocol without IV contrast. COMPARISON:  CT scan of November 19, 2011. FINDINGS: Multilevel degenerative disc disease is noted in the lumbar spine. Moderate bilateral pleural effusions are noted with adjacent subsegmental atelectasis. Status post cholecystectomy. No focal abnormality is noted in the liver, spleen or pancreas on these  unenhanced images. Adrenal glands and kidneys appear normal. No hydronephrosis or renal obstruction is noted. No renal or ureteral calculi are noted. There is no evidence of bowel obstruction. Minimal amount of ascites is noted in the pelvis. Status post hysterectomy. Urinary bladder decompressed secondary to Foley catheter. Ovaries are unremarkable. Moderate anasarca is noted. IMPRESSION: Moderate anasarca. Moderate bilateral pleural effusions with adjacent subsegmental atelectasis. Minimal ascites is noted. No other significant abnormality seen in the abdomen or pelvis. Electronically Signed   By: Lupita Raider, M.D.   On: 05/15/2016 16:11   Ct Head Wo Contrast  05/06/2016  CLINICAL DATA:  Altered mental status since last night. Hyperglycemia. History of chronic kidney disease and hypertension. EXAM: CT HEAD WITHOUT CONTRAST TECHNIQUE: Contiguous axial images were obtained from the base of the skull through the vertex without intravenous contrast. COMPARISON:  Head CT 04/11/2016 and 10/06/2013. FINDINGS: Brain: There is no evidence of acute intracranial hemorrhage, mass lesion, brain edema or extra-axial fluid collection. The ventricles and subarachnoid spaces are appropriately sized for age. There is no CT evidence of acute cortical infarction. There are stable mild chronic small vessel ischemic changes in the periventricular white matter. Intracranial vascular calcifications are present. Bones/sinuses/visualized face: The visualized paranasal sinuses, mastoid air cells  and middle ears are clear. The calvarium is intact. IMPRESSION: Stable head CT demonstrating mild small vessel ischemic changes. No acute intracranial findings. Electronically Signed   By: Carey Bullocks M.D.   On: 05/06/2016 12:17   Ct Chest Wo Contrast  05/12/2016  CLINICAL DATA:  70 year old female inpatient with shortness of breath, diabetic ketoacidosis, altered mental status, acute respiratory failure. EXAM: CT CHEST WITHOUT CONTRAST TECHNIQUE: Multidetector CT imaging of the chest was performed following the standard protocol without IV contrast. COMPARISON:  05/10/2016 chest radiograph. No prior chest CT. 11/19/2011 CT abdomen. FINDINGS: Mediastinum/Nodes: Normal heart size. No pericardial fluid/thickening. Left anterior descending and right coronary atherosclerosis. Atherosclerotic nonaneurysmal thoracic aorta. Normal caliber pulmonary arteries. Atrophic appearing thyroid. Normal esophagus. No pathologically enlarged axillary, mediastinal or gross hilar lymph nodes, noting limited sensitivity for the detection of hilar adenopathy on this noncontrast study. Lungs/Pleura: No pneumothorax. Small to moderate layering pleural effusions bilaterally. Compressive moderate left lower lobe and mild-to-moderate right lower lobe atelectasis. There is patchy ground-glass opacity and consolidation throughout both lungs, most prominent in the right upper lobe, in a peribronchovascular and peripheral distribution. There is associated mild interlobular septal thickening predominantly in the apical upper lobes. No significant solid pulmonary nodules in the aerated portions of the lungs. Upper abdomen: Unremarkable. Musculoskeletal: No aggressive appearing focal osseous lesions. Moderate degenerative changes in the thoracic spine. Partially visualized surgical hardware from ACDF overlies the lower cervical spine. Mild to moderate anasarca. IMPRESSION: 1. Small to moderate layering bilateral pleural effusions with  associated compressive lower lobe atelectasis. Mild to moderate anasarca. 2. Nonspecific patchy peribronchovascular and peripheral ground-glass opacity and consolidation throughout both lungs, most prominent in the right upper lobe. Associated mild interlobular septal thickening. Differential includes multilobar pneumonia, noncardiogenic pulmonary edema/ARDS or alveolar hemorrhage such as due to vasculitis. 3. Two-vessel coronary atherosclerosis. Electronically Signed   By: Delbert Phenix M.D.   On: 05/12/2016 13:06   Mr Maxine Glenn Head Wo Contrast  05/16/2016  CLINICAL DATA:  Stroke EXAM: MRA NECK WITHOUT   CONTRAST MRA HEAD WITHOUT CONTRAST TECHNIQUE: Multiplanar and multiecho pulse sequences of the neck were obtained without intravenous contrast. Angiographic images of the neck were obtained using MRA technique without and with intravenous contast.; Angiographic images  of the Circle of Willis were obtained using MRA technique without intravenous contrast. COMPARISON:  MRI head 05/15/2016 FINDINGS: MRA NECK FINDINGS Both vertebral arteries patent to the basilar without stenosis. PICA, AICA patent bilaterally. Basilar widely patent. Superior cerebellar and posterior cerebral arteries patent. Moderate stenosis proximal right posterior cerebral artery. Left PCA widely patent. Internal carotid artery widely patent bilaterally without stenosis. Anterior and middle cerebral arteries patent bilaterally without stenosis or occlusion. Negative for cerebral aneurysm. MRA HEAD FINDINGS Carotid bifurcation widely patent bilaterally. No significant carotid stenosis. Both vertebral arteries patent to the basilar without stenosis. 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 Otherwise negative MRA of the brain No significant carotid or vertebral stenosis in the neck. Electronically Signed   By: Marlan Palau M.D.   On: 05/16/2016 11:01   Mr Angiogram Neck Wo Contrast  05/16/2016  CLINICAL DATA:  Stroke  EXAM: MRA NECK WITHOUT   CONTRAST MRA HEAD WITHOUT CONTRAST TECHNIQUE: Multiplanar and multiecho pulse sequences of the neck were obtained without intravenous contrast. Angiographic images of the neck were obtained using MRA technique without and with intravenous contast.; Angiographic images of the Circle of Willis were obtained using MRA technique without intravenous contrast. COMPARISON:  MRI head 05/15/2016 FINDINGS: MRA NECK FINDINGS Both vertebral arteries patent to the basilar without stenosis. PICA, AICA patent bilaterally. Basilar widely patent. Superior cerebellar and posterior cerebral arteries patent. Moderate stenosis proximal right posterior cerebral artery. Left PCA widely patent. Internal carotid artery widely patent bilaterally without stenosis. Anterior and middle cerebral arteries patent bilaterally without stenosis or occlusion. Negative for cerebral aneurysm. MRA HEAD FINDINGS Carotid bifurcation widely patent bilaterally. No significant carotid stenosis. Both vertebral arteries patent to the basilar without stenosis. 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 Otherwise negative MRA of the brain No significant carotid or vertebral stenosis in the neck. Electronically Signed   By: Marlan Palau M.D.   On: 05/16/2016 11:01   Mr Brain Wo Contrast  05/16/2016  ADDENDUM REPORT: 05/16/2016 13:35 ADDENDUM: Correction, Impression #1 should read: 1. Small acute infarct in the RIGHT occipital pole. Electronically Signed   By: Odessa Fleming M.D.   On: 05/16/2016 13:35  05/16/2016  CLINICAL DATA:  70 year old female with headache and dizziness. Recent altered mental status and hyperglycemia. Initial encounter. EXAM: MRI HEAD WITHOUT CONTRAST TECHNIQUE: Multiplanar, multiecho pulse sequences of the brain and surrounding structures were obtained without intravenous contrast. COMPARISON:  Head CT without contrast 05/06/2016 and earlier. [facility] Brain MRI  04/10/2005. FINDINGS: Major intracranial vascular flow voids are stable since 2006. There is a small area of cortical restricted diffusion in the inferomedial right occipital pole (series 3, image 18) associated with mild T2 and FLAIR hyperintensity. No associated mass effect. No definite hemorrhage; prominent appearance of cortical veins in the region of diffusion abnormality on susceptibility imaging series 9, image 33. There do also appear to be 1 or 2 small foci of restricted diffusion in the right hippocampal formation (series 3, image 20). Furthermore, there is a punctate area of trace diffusion abnormality indeterminate for restriction in the medial left thalamus on series 3, image 26. No other restricted diffusion identified. Generalized cerebral volume loss since 2006 but no cortical encephalomalacia identified. There is a chronic micro hemorrhage in the lateral left thalamus (series 9, image 50). Outside of this in the acute findings described earlier gray and white matter signal is normal for age. Mild right mastoid effusion. Negative nasopharynx. Left mastoids are clear. Mild paranasal sinus  mucosal thickening. Postoperative changes to both globes. Negative orbit and scalp soft tissues. Normal bone marrow signal. Negative visualized cervical spine. IMPRESSION: 1. Small acute infarct in the left occipital pole. 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 2. No mass effect or definite acute hemorrhage. 3. No other acute intracranial abnormality. Mild for age nonspecific chronic signal changes in the brain otherwise. Electronically Signed: By: Odessa FlemingH  Hall M.D. On: 05/15/2016 14:07   [place] Renal  05/07/2016  CLINICAL DATA:  Acute on chronic renal failure.  Initial encounter. EXAM: RENAL / URINARY TRACT ULTRASOUND COMPLETE  COMPARISON:  CT of the abdomen and pelvis performed 11/19/2011, and renal ultrasound performed 09/30/2008 FINDINGS: Right Kidney: Length: 9.9 cm. Echogenicity within normal limits. No mass or hydronephrosis visualized. Left Kidney: Length: 9.4 cm. Echogenicity within normal limits. No mass or hydronephrosis visualized. Bladder: Decompressed, with a Foley catheter in place. IMPRESSION: Unremarkable renal ultrasound. Electronically Signed   By: Roanna RaiderJeffery  Chang M.D.   On: 05/07/2016 23:38   Dg Chest Port 1 View  05/10/2016  CLINICAL DATA:  Respiratory failure. EXAM: PORTABLE CHEST 1 VIEW COMPARISON:  05/09/2016. FINDINGS: Interim removal of endotracheal tube and NG tube. Right IJ line in stable position. Heart size normal. Low lung volumes with mild bibasilar atelectasis and/or infiltrate. Small left pleural effusion. Prior cervical spine fusion. IMPRESSION: 1. Interim removal endotracheal tube and NG tube. Right IJ line stable position. 2. Low lung volumes with mild bibasilar atelectasis and/or infiltrates again noted. Electronically Signed   By: Maisie Fushomas  Register   On: 05/10/2016 07:42   Dg Chest Port 1 View  05/09/2016  CLINICAL DATA:  Respiratory failure. EXAM: PORTABLE CHEST 1 VIEW COMPARISON:  05/08/2016.  05/07/2016 . FINDINGS: Endotracheal tube, NG tube, right IJ line stable position. Heart size stable. Mild bibasilar atelectasis and/or infiltrates again noted. No prominent pleural effusion. No pneumothorax. Prior cervical spine fusion IMPRESSION: 1. Lines and tubes in stable position. 2. Mild bibasilar atelectasis and/or infiltrates again noted. No interim change. Electronically Signed   By: Maisie Fushomas  Register   On: 05/09/2016 07:47   Dg Chest Port 1 View  05/08/2016  CLINICAL DATA:  Endotracheal tube position EXAM: PORTABLE CHEST 1 VIEW COMPARISON:  05/07/2016 FINDINGS: Endotracheal tube remains in good position. Right jugular central venous catheter tip in the mid SVC unchanged. NG tube in the stomach.  Mild bibasilar airspace disease improved. Negative for heart failure or effusion. No edema. IMPRESSION: Endotracheal tube remains in good position. Improved aeration in the lung bases with decrease in bibasilar airspace disease. Electronically Signed   By: Marlan Palauharles  Clark M.D.   On: 05/08/2016 07:50   Dg Chest Port 1 View  05/07/2016  CLINICAL DATA:  Respiratory failure. EXAM: PORTABLE CHEST 1 VIEW COMPARISON:  05/06/2016. FINDINGS: Endotracheal tube, NG tube, right IJ line stable position. Heart size normal. Bibasilar atelectasis and/or infiltrates. No pleural effusion or pneumothorax. Prior cervical spine fusion. IMPRESSION: 1.  Lines and tubes in stable position. 2.  Mild bibasilar atelectasis and/or infiltrates. Electronically Signed   By: Maisie Fushomas  Register   On: 05/07/2016 07:49   Dg Chest Portable 1 View  05/06/2016  CLINICAL DATA:  intubation Central line placement EXAM: PORTABLE CHEST 1 VIEW COMPARISON:  05/06/2016 at 1117 hours FINDINGS: Endotracheal tube terminates 5.2 cm above carina.Nasogastric tube extends beyond the inferior aspect of the film. Right internal jugular line tip at  mid SVC. Midline trachea. Normal heart size. No pleural effusion or pneumothorax. Clear lungs. Cervical spine fixation. IMPRESSION: Appropriate position of endotracheal and right internal jugular lines. No pneumothorax or other acute complication. Electronically Signed   By: Jeronimo Greaves M.D.   On: 05/06/2016 14:39   Dg Chest Portable 1 View  05/06/2016  CLINICAL DATA:  Sepsis.  Altered mental status EXAM: PORTABLE CHEST 1 VIEW COMPARISON:  04/20/2014 FINDINGS: The heart size and mediastinal contours are within normal limits. Both lungs are clear. The visualized skeletal structures are unremarkable. IMPRESSION: No active disease. Electronically Signed   By: Marlan Palau M.D.   On: 05/06/2016 11:44   Dg Abd Portable 1v  05/09/2016  CLINICAL DATA:  VOMITING EXAM: PORTABLE ABDOMEN - 1 VIEW COMPARISON:  11/19/2011  FINDINGS: Nasogastric tube is in place, tip overlying the level of the distal stomach or proximal duodenum. Surgical clips are identified in the right upper quadrant of the abdomen. Nonobstructive bowel gas pattern. Visualized osseous structures have a normal appearance. IMPRESSION: Nasogastric tube tip overlying the distal stomach or proximal duodenum. Electronically Signed   By: Norva Pavlov M.D.   On: 05/09/2016 13:11    Microbiology: Recent Results (from the past 240 hour(s))  C difficile quick scan w PCR reflex     Status: None   Collection Time: 05/11/16 12:02 PM  Result Value Ref Range Status   C Diff antigen NEGATIVE NEGATIVE Final   C Diff toxin NEGATIVE NEGATIVE Final   C Diff interpretation Negative for toxigenic C. difficile  Final  Gastrointestinal Panel by PCR , Stool     Status: None   Collection Time: 05/15/16  7:59 PM  Result Value Ref Range Status   Campylobacter species NOT DETECTED NOT DETECTED Final   Plesimonas shigelloides NOT DETECTED NOT DETECTED Final   Salmonella species NOT DETECTED NOT DETECTED Final   Yersinia enterocolitica NOT DETECTED NOT DETECTED Final   Vibrio species NOT DETECTED NOT DETECTED Final   Vibrio cholerae NOT DETECTED NOT DETECTED Final   Enteroaggregative E coli (EAEC) NOT DETECTED NOT DETECTED Final   Enteropathogenic E coli (EPEC) NOT DETECTED NOT DETECTED Final   Enterotoxigenic E coli (ETEC) NOT DETECTED NOT DETECTED Final   Shiga like toxin producing E coli (STEC) NOT DETECTED NOT DETECTED Final   E. coli O157 NOT DETECTED NOT DETECTED Final   Shigella/Enteroinvasive E coli (EIEC) NOT DETECTED NOT DETECTED Final   Cryptosporidium NOT DETECTED NOT DETECTED Final   Cyclospora cayetanensis NOT DETECTED NOT DETECTED Final   Entamoeba histolytica NOT DETECTED NOT DETECTED Final   Giardia lamblia NOT DETECTED NOT DETECTED Final   Adenovirus F40/41 NOT DETECTED NOT DETECTED Final   Astrovirus NOT DETECTED NOT DETECTED Final   Norovirus  GI/GII NOT DETECTED NOT DETECTED Final   Rotavirus A NOT DETECTED NOT DETECTED Final   Sapovirus (I, II, IV, and V) NOT DETECTED NOT DETECTED Final     Labs: CBC:  Recent Labs Lab 05/14/16 0310 05/15/16 0336 05/16/16 0320 05/17/16 0301 05/18/16 0240  WBC 7.3 8.9 9.9 9.0 8.8  NEUTROABS  --   --   --   --  6.0  HGB 8.7* 9.1* 8.7* 9.2* 9.0*  HCT 27.1* 27.9* 27.0* 28.4* 27.1*  MCV 96.8 96.5 95.7 97.6 94.4  PLT 315 393 409* 437* 391   Basic Metabolic Panel:  Recent Labs Lab 05/13/16 0220 05/14/16 0310 05/15/16 0336 05/16/16 0320 05/17/16 0301 05/18/16 0240 05/19/16 0246  NA 142 141 140 141 140 139  137  K 3.5 3.8 4.0 3.3* 3.4* 3.4* 4.7  CL 110 108 107 107 105 105 105  CO2 23 20* 17* 22 25 25 25   GLUCOSE 165* 153* 272* 104* 269* 239* 407*  BUN 17 18 18 20 18 14 14   CREATININE 1.96* 1.86* 1.99* 2.07* 1.58* 1.31* 1.49*  CALCIUM 7.8* 7.9* 8.0* 8.0* 8.1* 7.8* 7.8*  MG  --  1.7  --   --   --  1.4* 2.0  PHOS 2.1* 3.7 4.3  --  4.3  --   --    Liver Function Tests:  Recent Labs Lab 05/13/16 0220 05/14/16 0310 05/15/16 0336 05/16/16 0320 05/18/16 0240  AST  --   --   --  14* 14*  ALT  --   --   --  23 17  ALKPHOS  --   --   --  51 47  BILITOT  --   --   --  0.9 0.5  PROT  --   --   --  4.3* 4.3*  ALBUMIN 2.0* 2.0* 2.1* 1.9* 1.8*    Recent Labs Lab 05/16/16 0320  LIPASE 80*   CBG:  Recent Labs Lab 05/18/16 1141 05/18/16 1624 05/18/16 2141 05/19/16 0644 05/19/16 1122  GLUCAP 144* 175* 388* 334* 152*   Time spent: 30 minutes  Signed:  Pedrohenrique Mcconville  [facility]  05/19/2016  , 11:48 AM

## 2016-05-20 ENCOUNTER — Telehealth: Payer: Self-pay | Admitting: Family Medicine

## 2016-05-20 NOTE — Telephone Encounter (Signed)
Joseph from [employer] called stating they need information on patients insulin. Please return his call @ [phone].

## 2016-05-20 NOTE — Telephone Encounter (Signed)
Could not help him because she gets her insulin through [facility]

## 2016-06-22 ENCOUNTER — Inpatient Hospital Stay (HOSPITAL_COMMUNITY)
Admission: EM | Admit: 2016-06-22 | Discharge: 2016-06-29 | DRG: 637 | Disposition: A | Discharge status: Home Health | Payer: Medicare Other | Attending: Internal Medicine | Admitting: Internal Medicine

## 2016-06-22 ENCOUNTER — Encounter (HOSPITAL_COMMUNITY): Payer: Self-pay

## 2016-06-22 ENCOUNTER — Emergency Department (HOSPITAL_COMMUNITY): Payer: Medicare Other

## 2016-06-22 ENCOUNTER — Inpatient Hospital Stay (HOSPITAL_COMMUNITY): Payer: Medicare Other

## 2016-06-22 DIAGNOSIS — D638 Anemia in other chronic diseases classified elsewhere: Secondary | ICD-10-CM | POA: Diagnosis present

## 2016-06-22 DIAGNOSIS — I10 Essential (primary) hypertension: Secondary | ICD-10-CM | POA: Insufficient documentation

## 2016-06-22 DIAGNOSIS — Z7902 Long term (current) use of antithrombotics/antiplatelets: Secondary | ICD-10-CM

## 2016-06-22 DIAGNOSIS — F319 Bipolar disorder, unspecified: Secondary | ICD-10-CM | POA: Diagnosis present

## 2016-06-22 DIAGNOSIS — E10649 Type 1 diabetes mellitus with hypoglycemia without coma: Secondary | ICD-10-CM | POA: Diagnosis not present

## 2016-06-22 DIAGNOSIS — Z8673 Personal history of transient ischemic attack (TIA), and cerebral infarction without residual deficits: Secondary | ICD-10-CM | POA: Diagnosis not present

## 2016-06-22 DIAGNOSIS — I129 Hypertensive chronic kidney disease with stage 1 through stage 4 chronic kidney disease, or unspecified chronic kidney disease: Secondary | ICD-10-CM | POA: Diagnosis present

## 2016-06-22 DIAGNOSIS — E538 Deficiency of other specified B group vitamins: Secondary | ICD-10-CM | POA: Diagnosis present

## 2016-06-22 DIAGNOSIS — H8110 Benign paroxysmal vertigo, unspecified ear: Secondary | ICD-10-CM | POA: Diagnosis present

## 2016-06-22 DIAGNOSIS — E0810 Diabetes mellitus due to underlying condition with ketoacidosis without coma: Secondary | ICD-10-CM | POA: Diagnosis not present

## 2016-06-22 DIAGNOSIS — E876 Hypokalemia: Secondary | ICD-10-CM | POA: Diagnosis not present

## 2016-06-22 DIAGNOSIS — F419 Anxiety disorder, unspecified: Secondary | ICD-10-CM | POA: Diagnosis present

## 2016-06-22 DIAGNOSIS — Z823 Family history of stroke: Secondary | ICD-10-CM

## 2016-06-22 DIAGNOSIS — G9341 Metabolic encephalopathy: Secondary | ICD-10-CM | POA: Diagnosis present

## 2016-06-22 DIAGNOSIS — H409 Unspecified glaucoma: Secondary | ICD-10-CM | POA: Diagnosis present

## 2016-06-22 DIAGNOSIS — Z833 Family history of diabetes mellitus: Secondary | ICD-10-CM

## 2016-06-22 DIAGNOSIS — Z7982 Long term (current) use of aspirin: Secondary | ICD-10-CM

## 2016-06-22 DIAGNOSIS — E039 Hypothyroidism, unspecified: Secondary | ICD-10-CM | POA: Diagnosis present

## 2016-06-22 DIAGNOSIS — N179 Acute kidney failure, unspecified: Secondary | ICD-10-CM | POA: Diagnosis not present

## 2016-06-22 DIAGNOSIS — E875 Hyperkalemia: Secondary | ICD-10-CM | POA: Diagnosis present

## 2016-06-22 DIAGNOSIS — R748 Abnormal levels of other serum enzymes: Secondary | ICD-10-CM | POA: Diagnosis not present

## 2016-06-22 DIAGNOSIS — E785 Hyperlipidemia, unspecified: Secondary | ICD-10-CM | POA: Diagnosis present

## 2016-06-22 DIAGNOSIS — N183 Chronic kidney disease, stage 3 (moderate): Secondary | ICD-10-CM | POA: Diagnosis present

## 2016-06-22 DIAGNOSIS — Z794 Long term (current) use of insulin: Secondary | ICD-10-CM

## 2016-06-22 DIAGNOSIS — Z881 Allergy status to other antibiotic agents status: Secondary | ICD-10-CM

## 2016-06-22 DIAGNOSIS — Z888 Allergy status to other drugs, medicaments and biological substances status: Secondary | ICD-10-CM

## 2016-06-22 DIAGNOSIS — E1022 Type 1 diabetes mellitus with diabetic chronic kidney disease: Secondary | ICD-10-CM | POA: Diagnosis present

## 2016-06-22 DIAGNOSIS — E1010 Type 1 diabetes mellitus with ketoacidosis without coma: Secondary | ICD-10-CM | POA: Diagnosis not present

## 2016-06-22 DIAGNOSIS — Z79899 Other long term (current) drug therapy: Secondary | ICD-10-CM

## 2016-06-22 DIAGNOSIS — R5381 Other malaise: Secondary | ICD-10-CM | POA: Insufficient documentation

## 2016-06-22 DIAGNOSIS — K219 Gastro-esophageal reflux disease without esophagitis: Secondary | ICD-10-CM | POA: Diagnosis present

## 2016-06-22 DIAGNOSIS — F17210 Nicotine dependence, cigarettes, uncomplicated: Secondary | ICD-10-CM | POA: Diagnosis present

## 2016-06-22 DIAGNOSIS — D6489 Other specified anemias: Secondary | ICD-10-CM | POA: Diagnosis present

## 2016-06-22 DIAGNOSIS — I639 Cerebral infarction, unspecified: Secondary | ICD-10-CM | POA: Diagnosis not present

## 2016-06-22 DIAGNOSIS — G9340 Encephalopathy, unspecified: Secondary | ICD-10-CM

## 2016-06-22 DIAGNOSIS — E162 Hypoglycemia, unspecified: Secondary | ICD-10-CM | POA: Diagnosis not present

## 2016-06-22 DIAGNOSIS — R05 Cough: Secondary | ICD-10-CM | POA: Diagnosis not present

## 2016-06-22 DIAGNOSIS — I6789 Other cerebrovascular disease: Secondary | ICD-10-CM | POA: Diagnosis not present

## 2016-06-22 DIAGNOSIS — N3010 Interstitial cystitis (chronic) without hematuria: Secondary | ICD-10-CM | POA: Diagnosis present

## 2016-06-22 DIAGNOSIS — Z8249 Family history of ischemic heart disease and other diseases of the circulatory system: Secondary | ICD-10-CM

## 2016-06-22 DIAGNOSIS — Z885 Allergy status to narcotic agent status: Secondary | ICD-10-CM

## 2016-06-22 DIAGNOSIS — N189 Chronic kidney disease, unspecified: Secondary | ICD-10-CM

## 2016-06-22 LAB — MRSA PCR SCREENING: MRSA BY PCR: NEGATIVE

## 2016-06-22 LAB — PROCALCITONIN: Procalcitonin: 0.4 ng/mL

## 2016-06-22 LAB — I-STAT VENOUS BLOOD GAS, ED
ACID-BASE DEFICIT: 28 mmol/L — AB (ref 0.0–2.0)
Acid-base deficit: 26 mmol/L — ABNORMAL HIGH (ref 0.0–2.0)
BICARBONATE: 5 meq/L — AB (ref 20.0–24.0)
BICARBONATE: 3.6 meq/L — AB (ref 20.0–24.0)
O2 SAT: 47 %
O2 Saturation: 47 %
PCO2 VEN: 22 mmHg — AB (ref 45.0–50.0)
PH VEN: 6.897 — AB (ref 7.250–7.300)
PO2 VEN: 42 mmHg (ref 31.0–45.0)
TCO2: 6 mmol/L (ref 0–100)
pCO2, Ven: 18.3 mmHg — ABNORMAL LOW (ref 45.0–50.0)
pH, Ven: 6.961 — CL (ref 7.250–7.300)
pO2, Ven: 39 mmHg (ref 31.0–45.0)

## 2016-06-22 LAB — CBC
HCT: 31.1 % — ABNORMAL LOW (ref 36.0–46.0)
Hemoglobin: 9.2 g/dL — ABNORMAL LOW (ref 12.0–15.0)
MCH: 31.1 pg (ref 26.0–34.0)
MCHC: 29.6 g/dL — ABNORMAL LOW (ref 30.0–36.0)
MCV: 105.1 fL — ABNORMAL HIGH (ref 78.0–100.0)
PLATELETS: 318 10*3/uL (ref 150–400)
RBC: 2.96 MIL/uL — AB (ref 3.87–5.11)
RDW: 12.8 % (ref 11.5–15.5)
WBC: 15.2 10*3/uL — AB (ref 4.0–10.5)

## 2016-06-22 LAB — BASIC METABOLIC PANEL
Anion gap: 16 — ABNORMAL HIGH (ref 5–15)
BUN: 49 mg/dL — ABNORMAL HIGH (ref 6–20)
BUN: 47 mg/dL — AB (ref 6–20)
CALCIUM: 7.7 mg/dL — AB (ref 8.9–10.3)
CALCIUM: 7.1 mg/dL — AB (ref 8.9–10.3)
CHLORIDE: 100 mmol/L — AB (ref 101–111)
CHLORIDE: 108 mmol/L (ref 101–111)
CO2: <7 mmol/L — ABNORMAL LOW (ref 22–32)
CO2: 11 mmol/L — ABNORMAL LOW (ref 22–32)
CREATININE: 3.26 mg/dL — AB (ref 0.44–1.00)
Creatinine, Ser: 3.05 mg/dL — ABNORMAL HIGH (ref 0.44–1.00)
GFR calc non Af Amer: 14 mL/min — ABNORMAL LOW (ref >60)
GFR, EST AFRICAN AMERICAN: 16 mL/min — AB (ref >60)
GFR, EST AFRICAN AMERICAN: 17 mL/min — AB (ref >60)
GFR, EST NON AFRICAN AMERICAN: 15 mL/min — AB (ref >60)
GLUCOSE: 431 mg/dL — AB (ref 65–99)
Glucose, Bld: 875 mg/dL (ref 65–99)
POTASSIUM: 3.9 mmol/L (ref 3.5–5.1)
Potassium: 6.1 mmol/L — ABNORMAL HIGH (ref 3.5–5.1)
SODIUM: 131 mmol/L — AB (ref 135–145)
SODIUM: 135 mmol/L (ref 135–145)

## 2016-06-22 LAB — T4, FREE: FREE T4: 0.6 ng/dL — AB (ref 0.61–1.12)

## 2016-06-22 LAB — CBG MONITORING, ED
Glucose-Capillary: >600 mg/dL (ref 65–99)
Glucose-Capillary: >600 mg/dL (ref 65–99)

## 2016-06-22 LAB — MAGNESIUM: MAGNESIUM: 2.3 mg/dL (ref 1.7–2.4)

## 2016-06-22 LAB — PHOSPHORUS: Phosphorus: 5.8 mg/dL — ABNORMAL HIGH (ref 2.5–4.6)

## 2016-06-22 LAB — COMPREHENSIVE METABOLIC PANEL
ALBUMIN: 3.5 g/dL (ref 3.5–5.0)
ALK PHOS: 67 U/L (ref 38–126)
ALT: 14 U/L (ref 14–54)
AST: 16 U/L (ref 15–41)
Anion gap: 26 — ABNORMAL HIGH (ref 5–15)
BILIRUBIN TOTAL: 1.4 mg/dL — AB (ref 0.3–1.2)
BUN: 49 mg/dL — ABNORMAL HIGH (ref 6–20)
CALCIUM: 8.4 mg/dL — AB (ref 8.9–10.3)
CO2: <7 mmol/L — ABNORMAL LOW (ref 22–32)
CREATININE: 3.18 mg/dL — AB (ref 0.44–1.00)
Chloride: 96 mmol/L — ABNORMAL LOW (ref 101–111)
GFR calc Af Amer: 16 mL/min — ABNORMAL LOW (ref >60)
GFR, EST NON AFRICAN AMERICAN: 14 mL/min — AB (ref >60)
GLUCOSE: 961 mg/dL — AB (ref 65–99)
Potassium: 6.4 mmol/L (ref 3.5–5.1)
Sodium: 128 mmol/L — ABNORMAL LOW (ref 135–145)
TOTAL PROTEIN: 6.1 g/dL — AB (ref 6.5–8.1)

## 2016-06-22 LAB — URINALYSIS, ROUTINE W REFLEX MICROSCOPIC
Bilirubin Urine: NEGATIVE
Glucose, UA: >1000 mg/dL — AB
Hgb urine dipstick: NEGATIVE
Ketones, ur: 15 mg/dL — AB
LEUKOCYTES UA: NEGATIVE
Nitrite: NEGATIVE
Specific Gravity, Urine: 1.03 (ref 1.005–1.030)
pH: 5 (ref 5.0–8.0)

## 2016-06-22 LAB — URINE MICROSCOPIC-ADD ON: RBC / HPF: NONE SEEN RBC/hpf (ref 0–5)

## 2016-06-22 LAB — BETA-HYDROXYBUTYRIC ACID

## 2016-06-22 LAB — LIPASE, BLOOD: Lipase: 138 U/L — ABNORMAL HIGH (ref 11–51)

## 2016-06-22 LAB — I-STAT TROPONIN, ED: Troponin i, poc: 0.01 ng/mL (ref 0.00–0.08)

## 2016-06-22 LAB — TSH: TSH: 15.603 u[IU]/mL — ABNORMAL HIGH (ref 0.350–4.500)

## 2016-06-22 LAB — I-STAT CG4 LACTIC ACID, ED: LACTIC ACID, VENOUS: 1.63 mmol/L (ref 0.5–1.9)

## 2016-06-22 MED ORDER — ONDANSETRON HCL 4 MG/2ML IJ SOLN
4.0000 mg | Freq: Four times a day (QID) | INTRAMUSCULAR | Status: DC | PRN
  Administered 2016-06-24 (×2): 4 mg via INTRAVENOUS
  Filled 2016-06-22 (×3): qty 2

## 2016-06-22 MED ORDER — DEXTROSE-NACL 5-0.45 % IV SOLN: INTRAVENOUS | Status: DC

## 2016-06-22 MED ORDER — DEXTROSE-NACL 5-0.45 % IV SOLN
INTRAVENOUS | Status: DC
  Administered 2016-06-23: 06:00:00 via INTRAVENOUS

## 2016-06-22 MED ORDER — SODIUM CHLORIDE 0.9 % IV BOLUS (SEPSIS)
1000.0000 mL | Freq: Once | INTRAVENOUS | Status: AC
  Administered 2016-06-22: 1000 mL via INTRAVENOUS

## 2016-06-22 MED ORDER — ONDANSETRON HCL 4 MG/2ML IJ SOLN
4.0000 mg | Freq: Once | INTRAMUSCULAR | Status: AC
  Administered 2016-06-22: 4 mg via INTRAVENOUS
  Filled 2016-06-22: qty 2

## 2016-06-22 MED ORDER — VANCOMYCIN HCL 500 MG IV SOLR
500.0000 mg | INTRAVENOUS | Status: DC
  Administered 2016-06-24: 500 mg via INTRAVENOUS
  Filled 2016-06-22 (×2): qty 500

## 2016-06-22 MED ORDER — SODIUM CHLORIDE 0.9 % IV SOLN: 250.0000 mL | INTRAVENOUS | Status: DC | PRN

## 2016-06-22 MED ORDER — PIPERACILLIN-TAZOBACTAM IN DEX 2-0.25 GM/50ML IV SOLN
2.2500 g | Freq: Once | INTRAVENOUS | Status: AC
  Administered 2016-06-22: 2.25 g via INTRAVENOUS
  Filled 2016-06-22: qty 50

## 2016-06-22 MED ORDER — SODIUM CHLORIDE 0.9 % IV SOLN: INTRAVENOUS | Status: DC

## 2016-06-22 MED ORDER — SODIUM CHLORIDE 0.9 % IV SOLN
INTRAVENOUS | Status: DC
Start: 2016-06-22 — End: 2016-06-22

## 2016-06-22 MED ORDER — PIPERACILLIN-TAZOBACTAM IN DEX 2-0.25 GM/50ML IV SOLN
2.2500 g | Freq: Three times a day (TID) | INTRAVENOUS | Status: DC
  Administered 2016-06-23 – 2016-06-25 (×7): 2.25 g via INTRAVENOUS
  Filled 2016-06-22 (×9): qty 50

## 2016-06-22 MED ORDER — SODIUM CHLORIDE 0.9 % IV SOLN
INTRAVENOUS | Status: DC
Start: 2016-06-22 — End: 2016-06-22
  Administered 2016-06-22: 22:00:00 via INTRAVENOUS

## 2016-06-22 MED ORDER — STERILE WATER FOR INJECTION IV SOLN
INTRAVENOUS | Status: DC
  Administered 2016-06-22: 20:00:00 via INTRAVENOUS
  Filled 2016-06-22 (×3): qty 850

## 2016-06-22 MED ORDER — HEPARIN SODIUM (PORCINE) 5000 UNIT/ML IJ SOLN
5000.0000 [IU] | Freq: Three times a day (TID) | INTRAMUSCULAR | Status: DC
  Administered 2016-06-22 – 2016-06-29 (×19): 5000 [IU] via SUBCUTANEOUS
  Filled 2016-06-22 (×18): qty 1

## 2016-06-22 MED ORDER — SODIUM CHLORIDE 0.9 % IV SOLN
INTRAVENOUS | Status: DC
  Administered 2016-06-22: 16.2 [IU]/h via INTRAVENOUS
  Filled 2016-06-22: qty 2.5

## 2016-06-22 MED ORDER — VANCOMYCIN HCL IN DEXTROSE 1-5 GM/200ML-% IV SOLN
1000.0000 mg | Freq: Once | INTRAVENOUS | Status: AC
  Administered 2016-06-22: 1000 mg via INTRAVENOUS
  Filled 2016-06-22: qty 200

## 2016-06-22 MED ORDER — SODIUM CHLORIDE 0.9 % IV SOLN
INTRAVENOUS | Status: DC
  Administered 2016-06-22: 5.4 [IU]/h via INTRAVENOUS
  Filled 2016-06-22: qty 2.5

## 2016-06-22 MED ORDER — SODIUM CHLORIDE 0.9 % IV SOLN
1.0000 mg | Freq: Once | INTRAVENOUS | Status: AC
  Administered 2016-06-22: 1 mg via INTRAVENOUS
  Filled 2016-06-22: qty 0.2

## 2016-06-22 MED ORDER — THIAMINE HCL 100 MG/ML IJ SOLN
100.0000 mg | Freq: Every day | INTRAMUSCULAR | Status: DC
  Administered 2016-06-22 – 2016-06-24 (×3): 100 mg via INTRAVENOUS
  Filled 2016-06-22 (×3): qty 2

## 2016-06-22 NOTE — ED Notes (Signed)
CBG >600 °

## 2016-06-22 NOTE — ED Provider Notes (Signed)
CSN: [id number]     Arrival date & time 06/22/16  1419 History   First MD Initiated Contact with Patient 06/22/16 1459     Chief Complaint  Patient presents with  . Emesis   (Consider location/radiation/quality/duration/timing/severity/associated sxs/prior Treatment) Patient is a 70 y.o. female presenting with vomiting. The history is provided by the patient and a relative.  Emesis Severity:  Severe Duration:  1 day Timing:  Constant Quality:  Stomach contents Progression:  Worsening Chronicity:  Recurrent Recent urination:  Increased Relieved by:  Nothing Worsened by:  Nothing tried Associated symptoms: abdominal pain and cough   Associated symptoms: no chills, no diarrhea, no fever, no headaches, no sore throat and no URI     Past Medical History  Diagnosis Date  . Diabetes mellitus without complication (HCC)   . Thyroid disease   . 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 Depression   . Hypertension   . CKD (chronic kidney disease) stage 3, GFR 30-59 ml/min   . Hypothyroidism   . Vitamin B12 deficiency   . Benign paroxysmal positional vertigo   . Broken shoulder   . Broken toes   . Broken finger   . Migraines   . GERD (gastroesophageal reflux disease)   . Anxiety   . Interstitial cystitis     bladder stretched every 9 months  . Allergic rhinitis   . Glaucoma   . Benign hypertension with CKD (chronic kidney disease) stage III   . Tobacco use   . Cervicalgia   . Elevated liver enzymes Hep B/C neg 2014  . Bipolar disorder (HCC)   . History of alcohol use   . Psoriasis   . 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 Appendectomy    . Tonsillectomy    . Cholecystectomy    . Hernia repair    . Cataract extraction    . Abdominal hysterectomy      with oophorectomy  . Bladder surgery    . Cervical disc surgery     Family History  Problem Relation Age of  Onset  . Alcohol abuse Mother   . Arthritis Mother   . Asthma Mother   . Cancer Mother     colon cancer  . Hypertension Mother   . Migraines Mother   . Stroke Mother   . Lung disease Mother   . COPD Mother   . Diabetes Father   . Hypertension Father   . Heart disease Father   . Heart attack Father   . Heart disease Paternal Grandmother   . Diabetes Paternal Grandmother   . Stroke Paternal Grandmother   . Cancer Paternal Grandmother   . Diabetes Paternal Grandfather    Social History  Substance Use Topics  . Smoking status: Current Every Day Smoker -- 0.25 packs/day    Types: Cigarettes  . Smokeless tobacco: Never Used  . Alcohol Use: No     Comment: Hasn't had any alcohol since last admission   OB History    No data available     Review of Systems  Constitutional: Negative for fever and chills.  HENT: Negative for congestion and sore throat.   Eyes: Negative for pain.  Respiratory: Negative for cough and shortness of breath.   Cardiovascular: Negative for chest pain and palpitations.  Gastrointestinal: Positive for vomiting and abdominal pain. Negative for nausea and diarrhea.  Genitourinary: Negative  for dysuria and flank pain.  Musculoskeletal: Negative for back pain and neck pain.  Skin: Negative for rash.  Allergic/Immunologic: Negative.   Neurological: Negative for dizziness, light-headedness and headaches.  Psychiatric/Behavioral: Negative for confusion.      Allergies  Ciprofloxacin; Codeine; Doxycycline; Hydrocodone; and Omnicef  Home Medications   Prior to Admission medications   Medication Sig Start Date End Date Taking? Authorizing Provider  amLODipine (NORVASC) 5 MG tablet Take 1 tablet (5 mg total) by mouth daily. 05/19/16  Yes Rolly Salter, MD  aspirin EC 81 MG EC tablet Take 1 tablet (81 mg total) by mouth daily. 05/19/16  Yes Rolly Salter, MD  atorvastatin (LIPITOR) 10 MG tablet Take 1 tablet (10 mg total) by mouth daily at 6 PM. Patient  taking differently: Take 10 mg by mouth at bedtime.  05/19/16  Yes Rolly Salter, MD  carvedilol (COREG) 12.5 MG tablet Take 1 tablet (12.5 mg total) by mouth 2 (two) times daily with a meal. 02/12/16  Yes Chilton Si, MD  cetirizine (ZYRTEC) 10 MG tablet Take 10 mg by mouth daily as needed for allergies.   Yes Historical Provider, MD  clopidogrel (PLAVIX) 75 MG tablet Take 1 tablet (75 mg total) by mouth daily. 05/19/16 08/19/16 Yes Rolly Salter, MD  escitalopram (LEXAPRO) 10 MG tablet Take 1 tablet (10 mg total) by mouth at bedtime. 05/19/16  Yes Rolly Salter, MD  folic acid (FOLVITE) 1 MG tablet Take 1 tablet (1 mg total) by mouth daily. 05/19/16  Yes Rolly Salter, MD  furosemide (LASIX) 20 MG tablet Take 1 tablet (20 mg total) by mouth daily. 05/19/16  Yes Rolly Salter, MD  insulin glargine (LANTUS) 100 UNIT/ML injection Inject 10 Units into the skin at bedtime.   Yes Historical Provider, MD  insulin lispro (HUMALOG) 100 UNIT/ML injection Inject 10 Units into the skin 3 (three) times daily with meals. Reported on 06/22/2016   Yes Historical Provider, MD  levothyroxine (SYNTHROID, LEVOTHROID) 75 MCG tablet Take 75 mcg by mouth daily. 05/30/16  Yes Historical Provider, MD  LORazepam (ATIVAN) 0.5 MG tablet Take 1 tablet (0.5 mg total) by mouth every 8 (eight) hours as needed for anxiety. 05/19/16  Yes Rolly Salter, MD  meclizine (ANTIVERT) 25 MG tablet Take 1 tablet (25 mg total) by mouth 3 (three) times daily as needed for dizziness. Patient taking differently: Take 12.5 mg by mouth 3 (three) times daily as needed for dizziness.  02/12/16  Yes Chilton Si, MD  pantoprazole (PROTONIX) 40 MG tablet Take 1 tablet (40 mg total) by mouth 2 (two) times daily. 05/19/16  Yes Rolly Salter, MD  saccharomyces boulardii (FLORASTOR) 250 MG capsule Take 1 capsule (250 mg total) by mouth 2 (two) times daily. 05/19/16  Yes Rolly Salter, MD  sodium chloride (MURO 128) 5 % ophthalmic solution Place 1 drop into both  eyes daily.   Yes Historical Provider, MD  thiamine 100 MG tablet Take 1 tablet (100 mg total) by mouth daily. 05/19/16  Yes Rolly Salter, MD  cyanocobalamin (,VITAMIN B-12,) 1000 MCG/ML injection Inject 1,000 mcg into the muscle every 30 (thirty) days. Last injection approx 3rd week of June, 2017    Historical Provider, MD  feeding supplement, GLUCERNA SHAKE, (GLUCERNA SHAKE) LIQD Take 237 mLs by mouth 3 (three) times daily between meals. Patient not taking: Reported on 06/22/2016 05/19/16   Rolly Salter, MD  hydrocortisone (ANUSOL-HC) 2.5 % rectal cream Place rectally 4 (  four) times daily. Patient not taking: Reported on 06/22/2016 05/19/16   Rolly SalterPranav M Patel, MD  polyethylene glycol [facility](MIRALAX / Ethelene HalGLYCOLAX) packet Take 17 g by mouth every other day. Patient not taking: Reported on 06/22/2016 05/19/16   Rolly SalterPranav M Patel, MD   BP 93/46 mmHg  Pulse 87  Temp(Src) 98.9 F (37.2 C) (Oral)  Resp 13  Ht 5\' 2"  (1.575 m)  Wt 49.896 kg  BMI 20.11 kg/m2  SpO2 100% Physical Exam  Constitutional: She is oriented to person, place, and time. She appears well-developed and well-nourished. She appears distressed.  HENT:  Head: Normocephalic and atraumatic.  Mouth/Throat: Mucous membranes are dry. No oropharyngeal exudate.  Eyes: Conjunctivae and EOM are normal. Pupils are equal, round, and reactive to light.  Neck: Normal range of motion. Neck supple.  Cardiovascular: Normal rate, regular rhythm and normal heart sounds.   Pulmonary/Chest: Effort normal and breath sounds normal. No respiratory distress.  Abdominal: Soft. Bowel sounds are normal. There is no tenderness.  Musculoskeletal: Normal range of motion.  Neurological: She is alert and oriented to person, place, and time. She has normal reflexes. No cranial nerve deficit.  Skin: Skin is warm and dry. She is not diaphoretic. There is pallor.  Psychiatric: She has a normal mood and affect.    ED Course  Procedures (including critical care time) Labs  Review Labs Reviewed  LIPASE, BLOOD - Abnormal; Notable for the following:    Lipase 138 (*)    All other components within normal limits  COMPREHENSIVE METABOLIC PANEL - Abnormal; Notable for the following:    Sodium 128 (*)    Potassium 6.4 (*)    Chloride 96 (*)    CO2 <7 (*)    Glucose, Bld 961 (*)    BUN 49 (*)    Creatinine, Ser 3.18 (*)    Calcium 8.4 (*)    Total Protein 6.1 (*)    Total Bilirubin 1.4 (*)    GFR calc non Af Amer 14 (*)    GFR calc Af Amer 16 (*)    Anion gap 26 (*)    All other components within normal limits  CBC - Abnormal; Notable for the following:    WBC 15.2 (*)    RBC 2.96 (*)    Hemoglobin 9.2 (*)    HCT 31.1 (*)    MCV 105.1 (*)    MCHC 29.6 (*)    All other components within normal limits  URINALYSIS, ROUTINE W REFLEX MICROSCOPIC (NOT AT [facility]) - Abnormal; Notable for the following:    APPearance CLOUDY (*)    Glucose, UA >1000 (*)    Ketones, ur 15 (*)    Protein, ur >300 (*)    All other components within normal limits  TSH - Abnormal; Notable for the following:    TSH 15.603 (*)    All other components within normal limits  BASIC METABOLIC PANEL - Abnormal; Notable for the following:    Sodium 131 (*)    Potassium 6.1 (*)    Chloride 100 (*)    CO2 <7 (*)    Glucose, Bld 875 (*)    BUN 49 (*)    Creatinine, Ser 3.26 (*)    Calcium 7.7 (*)    GFR calc non Af Amer 14 (*)    GFR calc Af Amer 16 (*)    All other components within normal limits  T4, FREE - Abnormal; Notable for the following:    Free T4 0.60 (*)  All other components within normal limits  PHOSPHORUS - Abnormal; Notable for the following:    Phosphorus 5.8 (*)    All other components within normal limits  BETA-HYDROXYBUTYRIC ACID - Abnormal; Notable for the following:    Beta-Hydroxybutyric Acid >8.00 (*)    All other components within normal limits  URINE MICROSCOPIC-ADD ON - Abnormal; Notable for the following:    Squamous Epithelial / LPF 0-5 (*)     Bacteria, UA FEW (*)    All other components within normal limits  BASIC METABOLIC PANEL - Abnormal; Notable for the following:    CO2 11 (*)    Glucose, Bld 431 (*)    BUN 47 (*)    Creatinine, Ser 3.05 (*)    Calcium 7.1 (*)    GFR calc non Af Amer 15 (*)    GFR calc Af Amer 17 (*)    Anion gap 16 (*)    All other components within normal limits  GLUCOSE, CAPILLARY - Abnormal; Notable for the following:    Glucose-Capillary >600 (*)    All other components within normal limits  GLUCOSE, CAPILLARY - Abnormal; Notable for the following:    Glucose-Capillary >600 (*)    All other components within normal limits  GLUCOSE, CAPILLARY - Abnormal; Notable for the following:    Glucose-Capillary 544 (*)    All other components within normal limits  GLUCOSE, CAPILLARY - Abnormal; Notable for the following:    Glucose-Capillary 463 (*)    All other components within normal limits  GLUCOSE, CAPILLARY - Abnormal; Notable for the following:    Glucose-Capillary 408 (*)    All other components within normal limits  GLUCOSE, CAPILLARY - Abnormal; Notable for the following:    Glucose-Capillary 340 (*)    All other components within normal limits  CBG MONITORING, ED - Abnormal; Notable for the following:    Glucose-Capillary >600 (*)    All other components within normal limits  I-STAT VENOUS BLOOD GAS, ED - Abnormal; Notable for the following:    pH, Ven 6.961 (*)    pCO2, Ven 22.0 (*)    Bicarbonate 5.0 (*)    Acid-base deficit 26.0 (*)    All other components within normal limits  CBG MONITORING, ED - Abnormal; Notable for the following:    Glucose-Capillary >600 (*)    All other components within normal limits  CBG MONITORING, ED - Abnormal; Notable for the following:    Glucose-Capillary >600 (*)    All other components within normal limits  I-STAT VENOUS BLOOD GAS, ED - Abnormal; Notable for the following:    pH, Ven 6.897 (*)    pCO2, Ven 18.3 (*)    Bicarbonate 3.6 (*)     Acid-base deficit 28.0 (*)    All other components within normal limits  CBG MONITORING, ED - Abnormal; Notable for the following:    Glucose-Capillary >600 (*)    All other components within normal limits  POCT I-STAT 3, ART BLOOD GAS (G3+) - Abnormal; Notable for the following:    pH, Arterial 7.284 (*)    pCO2 arterial 24.7 (*)    pO2, Arterial 42.0 (*)    Bicarbonate 11.7 (*)    Acid-base deficit 14.0 (*)    All other components within normal limits  MRSA PCR SCREENING  CULTURE, BLOOD (ROUTINE X 2)  CULTURE, BLOOD (ROUTINE X 2)  URINE CULTURE  PROCALCITONIN  MAGNESIUM  BLOOD GAS, VENOUS  BLOOD GAS, VENOUS  CBC WITH DIFFERENTIAL/PLATELET  BASIC METABOLIC PANEL  BASIC METABOLIC PANEL  CBC WITH DIFFERENTIAL/PLATELET  MAGNESIUM  PROCALCITONIN  PHOSPHORUS  BLOOD GAS, ARTERIAL  MAGNESIUM  PHOSPHORUS  MAGNESIUM  PHOSPHORUS  BASIC METABOLIC PANEL  BASIC METABOLIC PANEL  BASIC METABOLIC PANEL  I-STAT TROPOININ, ED  I-STAT CG4 LACTIC ACID, ED    Imaging Review Ct Head Wo Contrast  06/22/2016  CLINICAL DATA:  70 year old female with acute encephalopathy. EXAM: CT HEAD WITHOUT CONTRAST TECHNIQUE: Contiguous axial images were obtained from the base of the skull through the vertex without intravenous contrast. COMPARISON:  Head CT 05/06/2016. FINDINGS: Patchy and confluent areas of decreased attenuation are noted throughout the deep and periventricular white matter of the cerebral hemispheres bilaterally, compatible with chronic microvascular ischemic disease. No acute intracranial abnormalities. Specifically, no evidence of acute intracranial hemorrhage, no definite findings of acute/subacute cerebral ischemia, no mass, mass effect, hydrocephalus or abnormal intra or extra-axial fluid collections. Visualized paranasal sinuses and mastoids are well pneumatized. No acute displaced skull fractures are identified. IMPRESSION: 1. No acute intracranial abnormalities. 2. Chronic microvascular  ischemic changes in cerebral white matter redemonstrated, as above. Electronically Signed   By: Trudie Reed M.D.   On: 06/22/2016 22:30   Dg Chest Portable 1 View  06/22/2016  CLINICAL DATA:  Cough.  History of pneumonia.  Smoker. EXAM: PORTABLE CHEST 1 VIEW COMPARISON:  05/10/2016 chest radiograph. FINDINGS: Surgical hardware from [facility] overlies the lower cervical spine. Stable cardiomediastinal silhouette with normal heart size. No pneumothorax. No pleural effusion. No pulmonary edema. No acute consolidative airspace disease. IMPRESSION: No active disease. Electronically Signed   By: Delbert Phenix M.D.   On: 06/22/2016 15:40   I have personally reviewed and evaluated these images and lab results as part of my medical decision-making.   EKG Interpretation   Date/Time:  Saturday June 22 2016 16:05:16 EDT Ventricular Rate:  85 PR Interval:    QRS Duration: 85 QT Interval:  383 QTC Calculation: 456 R Axis:   71 Text Interpretation:  Sinus rhythm Probable anteroseptal infarct, old  Repol abnrm, severe global ischemia (LM/MVD) Baseline wander in lead(s) V6  rate is slower than previous, otherwise no significant change Confirmed by  LITTLE MD, RACHEL [phone]) on 06/22/2016 4:11:31 PM      MDM   Final diagnoses:  Acute encephalopathy  Diagnosis: DKA  The pt is a 70 yo female with a hx of DKA, CKD III, HTN, hypothyroidism presenting with emesis, cramping abdominal pain, and altered mental status.    On exam pt with soft BP pressure.  Oriented x 3 but speech inappropriate at times.  Family concerned pt halluciniating.  POC glucose >600.  2L NS ordered.  Concern for DKA at this time.  With hx of sepsis and hypotension on arrival blood and urine cultures ordered and broad spectrum abx given.    VBG with initial pH of 6.96 and bicarb of 5.0.  CMP with glucose of 961 and Na of 128 likely associated with hyperglycemia.  K elevated at 6.4 but no ECG changes.  Cr/BUN 3.18/49 elevated from baseline  ~1.4. AG of 26.   T bili also elevated but no marked abdominal TPP and doubt acute surgical abdominal process.  Pt reevaluated with continued tachypnea but maintaining airway and mental status with no decline.   She was started on insulin drip and critical care was consulted.  Repeat VBG displayed worsening acidosis and critical care ordered bicarb drip.    Pt remained with no decreasing mental status in the ED  until admitted to the ICU.   Labs and ECG were viewed by myself and incorporated into medical decision making.  Discussed pertinent finding with patient or caregiver prior to admission with no further questions.  Pt care supervised by my attending Dr. Clarene Duke.   Tery Sanfilippo, MD PGY-3 Emergency Medicine    Tery Sanfilippo, MD 06/23/16 0157  Laurence Spates, MD 06/27/16 [phone]

## 2016-06-22 NOTE — Progress Notes (Signed)
Pharmacy Antibiotic Note  Cassandra [name] is a 70 y.o. female admitted on 06/22/2016 with sepsis.  Pharmacy has been consulted for vanc/zosyn dosing. AF, wbc 15.2. SCr 3.26, CrCl~13.  Zosyn 2.25g IV x1 already given in ED.  Plan: Zosyn 2.25g IV q8h Vang 1g IV x1; then 500mg  IV q48h Monitor clinical progress, c/s, renal function, abx plan/LOT VT@SS  as indicated   Height: 5\' 2"  (157.5 cm) Weight: 110 lb (49.896 kg) IBW/kg (Calculated) : 50.1  Temp (24hrs), Avg:98.3 F (36.8 C), Min:98.3 F (36.8 C), Max:98.3 F (36.8 C)   Recent Labs Lab 06/22/16 1526 06/22/16 1549 06/22/16 1804  WBC 15.2*  --   --   CREATININE 3.18*  --  3.26*  LATICACIDVEN  --  1.63  --     Estimated Creatinine Clearance: 13 mL/min (by C-G formula based on Cr of 3.26).    Allergies  Allergen Reactions  . Ciprofloxacin Other (See Comments)    Pt does not recall specific reaction  . Codeine Diarrhea and Nausea And Vomiting  . Doxycycline Diarrhea and Nausea And Vomiting  . Hydrocodone Other (See Comments)    Pt does not recall specific reaction  . Omnicef [Cefdinir] Nausea Only and Other (See Comments)    Constipation, tolerated Zosyn    Antimicrobials this admission: 7/8 vanc >>  7/8 zosyn >>   Dose adjustments this admission:   Microbiology results: 7/8 BCx:  7/8 UCx:     Babs BertinHaley Dresden Lozito, PharmD, [facility] Clinical Pharmacist Pager [phone] 06/22/2016 6:47 PM

## 2016-06-22 NOTE — ED Notes (Addendum)
Pt arrives EMS with c/o not feeling well and vomiting. CBG :"hi" per EMS.Pt c/o sore throat,

## 2016-06-22 NOTE — H&P (Signed)
PULMONARY / CRITICAL CARE MEDICINE   Name: Cassandra [name] MRN: [id number] DOB: Jun 18, 1946    ADMISSION DATE:  06/22/2016 CONSULTATION DATE:  06/22/2016  REFERRING MD:  Nicanor Bake, M.D. / EDP  CHIEF COMPLAINT:  DKA  HISTORY OF PRESENT ILLNESS:  Patient presented to hospital with 1 day of persistent nausea and vomiting with abdominal pain and cough. Patient remains altered and history obtained from patient's daughter. Patient herself reporting she has a headache and is thirsty. Per daughter patient began to have a poor appetite and possibly 3 days ago feeling ill. Reportedly she did not take her Lantus last evening and began to have nausea and vomiting that same night. Reportedly has had also altered mentation and this time. No recent sick contacts. No recent travel or undercooked foods. Approximately 2 weeks ago was discharged from a skilled nursing facility after hospitalization with septic shock, diabetic ketoacidosis, acute encephalopathy, acute on chronic renal failure, and acute respiratory failure requiring endotracheal intubation.  PAST MEDICAL HISTORY :  Past Medical History  Diagnosis Date  . Diabetes mellitus without complication (HCC)   . Thyroid disease   . 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 Depression   . Hypertension   . CKD (chronic kidney disease) stage 3, GFR 30-59 ml/min   . Hypothyroidism   . Vitamin B12 deficiency   . Benign paroxysmal positional vertigo   . Broken shoulder   . Broken toes   . Broken finger   . Migraines   . GERD (gastroesophageal reflux disease)   . Anxiety   . Interstitial cystitis     bladder stretched every 9 months  . Allergic rhinitis   . Glaucoma   . Benign hypertension with CKD (chronic kidney disease) stage III   . Tobacco use   . Cervicalgia   . Elevated liver enzymes Hep B/C neg 2014  . Bipolar disorder (HCC)   . History of alcohol use   . Psoriasis   . Stroke (HCC) 05/16/2016    Left occipital and  thalamic, right hippocampal    PAST SURGICAL HISTORY: Past Surgical History  Procedure Laterality Date  . Appendectomy    . Tonsillectomy    . Cholecystectomy    . Hernia repair    . Cataract extraction    . Abdominal hysterectomy      with oophorectomy  . Bladder surgery    . Cervical disc surgery       Allergies  Allergen Reactions  . Ciprofloxacin Other (See Comments)    Pt does not recall specific reaction  . Codeine Diarrhea and Nausea And Vomiting  . Doxycycline Diarrhea and Nausea And Vomiting  . Hydrocodone Other (See Comments)    Pt does not recall specific reaction  . Omnicef [Cefdinir] Nausea Only and Other (See Comments)    Constipation, tolerated Zosyn    No current facility-administered medications on file prior to encounter.   Current Outpatient Prescriptions on File Prior to Encounter  Medication Sig  . amLODipine (NORVASC) 5 MG tablet Take 1 tablet (5 mg total) by mouth daily.  Marland Kitchen aspirin EC 81 MG EC tablet Take 1 tablet (81 mg total) by mouth daily.  Marland Kitchen atorvastatin (LIPITOR) 10 MG tablet Take 1 tablet (10 mg total) by mouth daily at 6 PM.  . carvedilol (COREG) 12.5 MG tablet Take 1 tablet (12.5 mg total) by mouth 2 (two) times daily with a meal.  . cetirizine (ZYRTEC) 10 MG tablet Take 10 mg by mouth  daily as needed for allergies.  Marland Kitchen. clopidogrel (PLAVIX) 75 MG tablet Take 1 tablet (75 mg total) by mouth daily.  . cyanocobalamin (,VITAMIN B-12,) 1000 MCG/ML injection Inject 1,000 mcg into the muscle every 30 (thirty) days.  Marland Kitchen. escitalopram (LEXAPRO) 10 MG tablet Take 1 tablet (10 mg total) by mouth at bedtime.  . feeding supplement, GLUCERNA SHAKE, (GLUCERNA SHAKE) LIQD Take 237 mLs by mouth 3 (three) times daily between meals.  . folic acid (FOLVITE) 1 MG tablet Take 1 tablet (1 mg total) by mouth daily.  . furosemide (LASIX) 20 MG tablet Take 1 tablet (20 mg total) by mouth daily.  . hydrocortisone (ANUSOL-HC) 2.5 % rectal cream Place rectally 4 (four)  times daily.  . insulin lispro (HUMALOG) 100 UNIT/ML injection Inject 3-10 Units into the skin 3 (three) times daily with meals. Sliding scale as needed  . lactobacillus acidophilus & bulgar (LACTINEX) chewable tablet Chew 1 tablet by mouth daily.  Marland Kitchen. LANTUS 100 UNIT/ML injection Inject 10 Units into the skin every morning.   Marland Kitchen. levothyroxine (SYNTHROID, LEVOTHROID) 100 MCG tablet Take 1 tablet by mouth daily. Take only on Mon, Wed, Friday.  Marland Kitchen. LORazepam (ATIVAN) 0.5 MG tablet Take 1 tablet (0.5 mg total) by mouth every 8 (eight) hours as needed for anxiety.  . meclizine (ANTIVERT) 25 MG tablet Take 1 tablet (25 mg total) by mouth 3 (three) times daily as needed for dizziness.  . pantoprazole (PROTONIX) 40 MG tablet Take 1 tablet (40 mg total) by mouth 2 (two) times daily.  . polyethylene glycol (MIRALAX / GLYCOLAX) packet Take 17 g by mouth every other day.  . saccharomyces boulardii (FLORASTOR) 250 MG capsule Take 1 capsule (250 mg total) by mouth 2 (two) times daily.  . sodium chloride (MURO 128) 5 % ophthalmic solution Reported on 01/17/2016  . thiamine 100 MG tablet Take 1 tablet (100 mg total) by mouth daily.    FAMILY HISTORY:  Family History  Problem Relation Age of Onset  . Alcohol abuse Mother   . Arthritis Mother   . Asthma Mother   . Cancer Mother     colon cancer  . Hypertension Mother   . Migraines Mother   . Stroke Mother   . Lung disease Mother   . COPD Mother   . Diabetes Father   . Hypertension Father   . Heart disease Father   . Heart attack Father   . Heart disease Paternal Grandmother   . Diabetes Paternal Grandmother   . Stroke Paternal Grandmother   . Cancer Paternal Grandmother   . Diabetes Paternal Grandfather     SOCIAL HISTORY: Social History  Substance Use Topics  . Smoking status: Current Every Day Smoker -- 0.25 packs/day    Types: Cigarettes  . Smokeless tobacco: Never Used  . Alcohol Use: No     Comment: Hasn't had any alcohol since last  admission    REVIEW OF SYSTEMS:  Unable to obtain secondary to altered mental status.  SUBJECTIVE: As above.  VITAL SIGNS: BP 91/58 mmHg  Pulse 83  Temp(Src) 98.3 F (36.8 C) (Oral)  Resp 20  Ht 5\' 2"  (1.575 m)  Wt 110 lb (49.896 kg)  BMI 20.11 kg/m2  SpO2 100%  HEMODYNAMICS:    VENTILATOR SETTINGS:    INTAKE / OUTPUT:    PHYSICAL EXAMINATION: General: Sleeping until awoken. No acute distress. Daughter at bedside.  Integument:  Warm & dry. No rash on exposed skin. No bruising. Lymphatics:  No appreciated cervical  or supraclavicular lymphadenoapthy. HEENT:  Tacky mucus membranes. No scleral injection or icterus. Cardiovascular:  Regular rate. No edema. No appreciable JVD.  Pulmonary:  Good aeration & clear to auscultation bilaterally. Symmetric chest wall expansion. No accessory muscle use. Abdomen: Soft. Normal bowel sounds. Nondistended. Grossly nontender. Musculoskeletal:  Normal bulk and tone. Hand grip strength 5/5 bilaterally. No joint deformity or effusion appreciated. Neurological:  CN 2-12 grossly in tact. No meningismus. Moving all 4 extremities equally. Following commands. Psychiatric:  Blunted affect. Oriented to person, president, and place but not year.  LABS:  BMET  Recent Labs Lab 06/22/16 1526  NA 128*  K 6.4*  CL 96*  CO2 <7*  BUN 49*  CREATININE 3.18*  GLUCOSE 961*    Electrolytes  Recent Labs Lab 06/22/16 1526  CALCIUM 8.4*    CBC  Recent Labs Lab 06/22/16 1526  WBC 15.2*  HGB 9.2*  HCT 31.1*  PLT 318    Coag's No results for input(s): APTT, INR in the last 168 hours.  Sepsis Markers  Recent Labs Lab 06/22/16 1549  LATICACIDVEN 1.63    ABG No results for input(s): PHART, PCO2ART, PO2ART in the last 168 hours.  Liver Enzymes  Recent Labs Lab 06/22/16 1526  AST 16  ALT 14  ALKPHOS 67  BILITOT 1.4*  ALBUMIN 3.5    Cardiac Enzymes No results for input(s): TROPONINI, PROBNP in the last 168  hours.  Glucose  Recent Labs Lab 06/22/16 1445 06/22/16 1645 06/22/16 1755  GLUCAP >600* >600* >600*    Imaging Dg Chest Portable 1 View  06/22/2016  CLINICAL DATA:  Cough.  History of pneumonia.  Smoker. EXAM: PORTABLE CHEST 1 VIEW COMPARISON:  05/10/2016 chest radiograph. FINDINGS: Surgical hardware from ACDF overlies the lower cervical spine. Stable cardiomediastinal silhouette with normal heart size. No pneumothorax. No pleural effusion. No pulmonary edema. No acute consolidative airspace disease. IMPRESSION: No active disease. Electronically Signed   By: Delbert Phenix M.D.   On: 06/22/2016 15:40     STUDIES:  Port CXR 7/8: No focal opacity or effusion.  MICROBIOLOGY: MRSA PCR 7/8>>> Blood Ctx x2 7/8>>> Urine Ctx 7/8>>>  ANTIBIOTICS: Vancomycin 7/8>>> Zosyn 7/8>>>  SIGNIFICANT EVENTS: 7/08 - Admit  LINES/TUBES: PIV x2  DISCUSSION:  70 year old female with known history of type 1 diabetes mellitus. Recent admission with sepsis and acute respiratory failure requiring endotracheal intubation. Patient also had DKA during that admission. Recently discharged from skilled nursing facility with reported urinary tract infection. Urinalysis suspicious for urinary tract infection. Initiating DKA protocol. Continuing to trend electrolytes per protocol. Holding home insulin at this time.   ASSESSMENT / PLAN:  ENDOCRINE A:   DKA H/O DM Type I H/O Hypothyroidism - TSH 15.6.  P:   DKA protocol Free T4 pending Holding home Humalog & Lantus Holding Home Synthroid Thiamin & Folic Acid Daily  NEUROLOGIC A:   Acute Encephalopathy - Likely multifactorial. 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  P:   Monitor closely for decompensation CT Head W/O Seizure Precautions Holding home Ativan & Lexapro  PULMONARY A: Ongoing Tobacco Use  P:   Continuous Pulse  Ox Tobacco Cessation education prior to discharge  CARDIOVASCULAR A:  H/O HTN  P:  Vitals per unit protocol Continuous Telemetry Monitoring Hold Lipitor, Coreg, Norvasc, Plavix, aspirin, & Lasix   RENAL A:   Acute on CKD Stage 3 Hyperkalemia -  Improving. Secondary to acidosis. AGMA - Secondary to DKA. Pseudohyponatremia  P:   Monitoring UOP Trending electrolytes & renal function per DKA order set Replacing electrolytes as indicated. Bicarb gtt at 150cc/hr after NS bolus is complete  GASTROINTESTINAL A:   H/O GERD Elevated Lipase - Mild.  P:   NPO  holding home Protonix  HEMATOLOGIC A:   Leukocytosis - Sepsis vs SIRS. Anemia - Chronic. No signs of active bleeding.  P:  Trending cell counts daily w/ CBC SCDs Heparin [place] q8hr  INFECTIOUS A:   SIRS/Possible Sepsis  P:   Continuing empiric Vancomycin & Zosyn Day #1 Awaiting Culture Results Trending PCT per algorithm  FAMILY  - Updates: Daughter updated at bedside by Dr. Jamison Neighbor 7/8.  - Inter-disciplinary family meet or Palliative Care meeting due by:  7/15  I have spent a total of 37 minutes of critical care time today caring for the patient, discussing the plan of care with the daughter at bedside, and reviewing the patient's electronic medical record.  Donna Christen Jamison Neighbor, M.D. [facility] Pager:  [phone] After 3pm or if no response, call [phone] 06/22/2016, 6:38 PM

## 2016-06-23 DIAGNOSIS — E162 Hypoglycemia, unspecified: Secondary | ICD-10-CM

## 2016-06-23 DIAGNOSIS — N183 Chronic kidney disease, stage 3 (moderate): Secondary | ICD-10-CM

## 2016-06-23 LAB — GLUCOSE, CAPILLARY
GLUCOSE-CAPILLARY: 340 mg/dL — AB (ref 65–99)
GLUCOSE-CAPILLARY: 172 mg/dL — AB (ref 65–99)
GLUCOSE-CAPILLARY: 114 mg/dL — AB (ref 65–99)
GLUCOSE-CAPILLARY: 45 mg/dL — AB (ref 65–99)
GLUCOSE-CAPILLARY: 91 mg/dL (ref 65–99)
GLUCOSE-CAPILLARY: 109 mg/dL — AB (ref 65–99)
GLUCOSE-CAPILLARY: 173 mg/dL — AB (ref 65–99)
GLUCOSE-CAPILLARY: 187 mg/dL — AB (ref 65–99)
Glucose-Capillary: 231 mg/dL — ABNORMAL HIGH (ref 65–99)
Glucose-Capillary: 260 mg/dL — ABNORMAL HIGH (ref 65–99)
Glucose-Capillary: 276 mg/dL — ABNORMAL HIGH (ref 65–99)
Glucose-Capillary: 408 mg/dL — ABNORMAL HIGH (ref 65–99)
Glucose-Capillary: 463 mg/dL — ABNORMAL HIGH (ref 65–99)
Glucose-Capillary: 544 mg/dL (ref 65–99)
Glucose-Capillary: 85 mg/dL (ref 65–99)

## 2016-06-23 LAB — CBC WITH DIFFERENTIAL/PLATELET
BASOS ABS: 0 10*3/uL (ref 0.0–0.1)
Basophils Relative: 0 %
Eosinophils Absolute: 0 10*3/uL (ref 0.0–0.7)
Eosinophils Relative: 0 %
HEMATOCRIT: 22.2 % — AB (ref 36.0–46.0)
HEMOGLOBIN: 7.6 g/dL — AB (ref 12.0–15.0)
LYMPHS PCT: 14 %
Lymphs Abs: 2.4 10*3/uL (ref 0.7–4.0)
MCH: 31.4 pg (ref 26.0–34.0)
MCHC: 34.2 g/dL (ref 30.0–36.0)
MCV: 91.7 fL (ref 78.0–100.0)
Monocytes Absolute: 1.9 10*3/uL — ABNORMAL HIGH (ref 0.1–1.0)
Monocytes Relative: 11 %
NEUTROS ABS: 12.8 10*3/uL — AB (ref 1.7–7.7)
NEUTROS PCT: 75 %
Platelets: 247 10*3/uL (ref 150–400)
RBC: 2.42 MIL/uL — AB (ref 3.87–5.11)
RDW: 12.1 % (ref 11.5–15.5)
SMEAR REVIEW: ADEQUATE
WBC: 17.1 10*3/uL — ABNORMAL HIGH (ref 4.0–10.5)

## 2016-06-23 LAB — BASIC METABOLIC PANEL
ANION GAP: 9 (ref 5–15)
ANION GAP: 12 (ref 5–15)
ANION GAP: 11 (ref 5–15)
BUN: 45 mg/dL — ABNORMAL HIGH (ref 6–20)
BUN: 44 mg/dL — ABNORMAL HIGH (ref 6–20)
BUN: 41 mg/dL — ABNORMAL HIGH (ref 6–20)
CALCIUM: 6.9 mg/dL — AB (ref 8.9–10.3)
CO2: 19 mmol/L — AB (ref 22–32)
CO2: 17 mmol/L — AB (ref 22–32)
CO2: 15 mmol/L — ABNORMAL LOW (ref 22–32)
Calcium: 7.1 mg/dL — ABNORMAL LOW (ref 8.9–10.3)
Calcium: 7.3 mg/dL — ABNORMAL LOW (ref 8.9–10.3)
Chloride: 110 mmol/L (ref 101–111)
Chloride: 109 mmol/L (ref 101–111)
Chloride: 108 mmol/L (ref 101–111)
Creatinine, Ser: 2.67 mg/dL — ABNORMAL HIGH (ref 0.44–1.00)
Creatinine, Ser: 2.59 mg/dL — ABNORMAL HIGH (ref 0.44–1.00)
Creatinine, Ser: 2.7 mg/dL — ABNORMAL HIGH (ref 0.44–1.00)
GFR calc Af Amer: 20 mL/min — ABNORMAL LOW (ref >60)
GFR calc non Af Amer: 17 mL/min — ABNORMAL LOW (ref >60)
GFR, EST AFRICAN AMERICAN: 21 mL/min — AB (ref >60)
GFR, EST AFRICAN AMERICAN: 20 mL/min — AB (ref >60)
GFR, EST NON AFRICAN AMERICAN: 17 mL/min — AB (ref >60)
GFR, EST NON AFRICAN AMERICAN: 18 mL/min — AB (ref >60)
GLUCOSE: 108 mg/dL — AB (ref 65–99)
GLUCOSE: 109 mg/dL — AB (ref 65–99)
GLUCOSE: 269 mg/dL — AB (ref 65–99)
POTASSIUM: 3 mmol/L — AB (ref 3.5–5.1)
POTASSIUM: 3.9 mmol/L (ref 3.5–5.1)
POTASSIUM: 4.7 mmol/L (ref 3.5–5.1)
Sodium: 138 mmol/L (ref 135–145)
Sodium: 138 mmol/L (ref 135–145)
Sodium: 134 mmol/L — ABNORMAL LOW (ref 135–145)

## 2016-06-23 LAB — BLOOD GAS, VENOUS
Acid-base deficit: 11.1 mmol/L — ABNORMAL HIGH (ref 0.0–2.0)
BICARBONATE: 14.5 meq/L — AB (ref 20.0–24.0)
O2 Saturation: 85.1 %
PH VEN: 7.262 (ref 7.250–7.300)
PO2 VEN: 54.4 mmHg — AB (ref 31.0–45.0)
Patient temperature: 98.6
TCO2: 15.5 mmol/L (ref 0–100)
pCO2, Ven: 33.3 mmHg — ABNORMAL LOW (ref 45.0–50.0)

## 2016-06-23 LAB — POCT I-STAT 3, ART BLOOD GAS (G3+)
Acid-base deficit: 14 mmol/L — ABNORMAL HIGH (ref 0.0–2.0)
Bicarbonate: 11.7 mEq/L — ABNORMAL LOW (ref 20.0–24.0)
O2 Saturation: 72 %
PCO2 ART: 24.7 mmHg — AB (ref 35.0–45.0)
PH ART: 7.284 — AB (ref 7.350–7.450)
PO2 ART: 42 mmHg — AB (ref 80.0–100.0)
Patient temperature: 98.9
TCO2: 12 mmol/L (ref 0–100)

## 2016-06-23 LAB — MAGNESIUM
MAGNESIUM: 1.8 mg/dL (ref 1.7–2.4)
Magnesium: 1.8 mg/dL (ref 1.7–2.4)

## 2016-06-23 LAB — URINE CULTURE
CULTURE: NO GROWTH
SPECIAL REQUESTS: NORMAL

## 2016-06-23 LAB — PHOSPHORUS
PHOSPHORUS: 3.6 mg/dL (ref 2.5–4.6)
Phosphorus: 3.5 mg/dL (ref 2.5–4.6)

## 2016-06-23 LAB — PROCALCITONIN: PROCALCITONIN: 0.7 ng/mL

## 2016-06-23 MED ORDER — DEXTROSE-NACL 5-0.45 % IV SOLN
INTRAVENOUS | Status: DC
  Administered 2016-06-23: 17:00:00 via INTRAVENOUS

## 2016-06-23 MED ORDER — CETYLPYRIDINIUM CHLORIDE 0.05 % MT LIQD
7.0000 mL | Freq: Two times a day (BID) | OROMUCOSAL | Status: DC
  Administered 2016-06-23 – 2016-06-24 (×4): 7 mL via OROMUCOSAL

## 2016-06-23 MED ORDER — LEVOTHYROXINE SODIUM 75 MCG PO TABS
75.0000 ug | ORAL_TABLET | Freq: Every day | ORAL | Status: DC
  Administered 2016-06-24 – 2016-06-29 (×6): 75 ug via ORAL
  Filled 2016-06-23 (×6): qty 1

## 2016-06-23 MED ORDER — DEXTROSE 50 % IV SOLN
INTRAVENOUS | Status: AC
  Administered 2016-06-23: 22 mL
  Filled 2016-06-23: qty 50

## 2016-06-23 MED ORDER — INSULIN ASPART 100 UNIT/ML ~~LOC~~ SOLN
0.0000 [IU] | Freq: Three times a day (TID) | SUBCUTANEOUS | Status: DC
  Administered 2016-06-23: 8 [IU] via SUBCUTANEOUS
  Administered 2016-06-23: 3 [IU] via SUBCUTANEOUS

## 2016-06-23 MED ORDER — SODIUM CHLORIDE 0.9 % IV SOLN
INTRAVENOUS | Status: DC
  Administered 2016-06-23: 01:00:00 via INTRAVENOUS

## 2016-06-23 MED ORDER — GUAIFENESIN-DM 100-10 MG/5ML PO SYRP
10.0000 mL | ORAL_SOLUTION | ORAL | Status: DC | PRN
  Administered 2016-06-23 – 2016-06-24 (×2): 10 mL via ORAL
  Filled 2016-06-23 (×2): qty 10

## 2016-06-23 MED ORDER — POTASSIUM CHLORIDE 10 MEQ/100ML IV SOLN
10.0000 meq | INTRAVENOUS | Status: AC
  Administered 2016-06-23 (×4): 10 meq via INTRAVENOUS
  Filled 2016-06-23 (×4): qty 100

## 2016-06-23 MED ORDER — DEXTROSE 50 % IV SOLN
INTRAVENOUS | Status: AC
  Filled 2016-06-23: qty 50

## 2016-06-23 NOTE — Progress Notes (Signed)
Pt became hypoglycemic. Treated with dextrose per protocol and glucostabilizer. Once stabilized. Insulin restarted and NS changed to D5. Pt resting comfortably. MD notified and in agreement with changes.

## 2016-06-23 NOTE — Progress Notes (Signed)
eLink Physician-Brief Progress Note Patient Name: Cassandra AbbeBillie A [name] DOB: 1946/11/15 MRN: [id number]   Date of Service  06/23/2016  HPI/Events of Note  Cough.  eICU Interventions  Will order: 1. Robitussin DM 10 mL Q 4 hours PRN cough.      Intervention Category Intermediate Interventions: Other:  Lenell AntuSommer,Steven Eugene 06/23/2016, 11:41 PM

## 2016-06-23 NOTE — Progress Notes (Signed)
eLink Physician-Brief Progress Note Patient Name: Cassandra AbbeBillie A [name] DOB: [birth year] MRN: [id number]   Date of Service  06/23/2016  HPI/Events of Note  I/O cath urine smells foul.   eICU Interventions  Will send UA.     Intervention Category Intermediate Interventions: Infection - evaluation and management  Sommer,Steven Eugene 06/23/2016, 5:53 AM

## 2016-06-23 NOTE — Progress Notes (Signed)
eLink Physician-Brief Progress Note Patient Name: Cassandra AbbeBillie A [name] DOB: 1946/04/09 MRN: [id number]   Date of Service  06/23/2016  HPI/Events of Note  Hypoglycemia - Blood glucose = 40's. Given D50. On speaking with bedside nurse, the ordered D5 0.45 NaCl IV infusion was never started when the blood glucose went below 250. The nurse has now started the D5 0.45 NaCl post hypoglycemic episode.   eICU Interventions  Continue present management.     Intervention Category Major Interventions: Other:  Cassandra [name] 06/23/2016, 6:30 AM

## 2016-06-23 NOTE — Progress Notes (Signed)
eLink Physician-Brief Progress Note Patient Name: Cassandra AbbeBillie A Ketter DOB: 1946-04-25 MRN: [id number]   Date of Service  06/23/2016  HPI/Events of Note  Venous blood gas with pH = 7.28. Currently on a NaHCO3 IV infusion.   eICU Interventions  Will order: 1. D/C NaHCO3 IV infusion.  2. 0.9 NaCl to run IV at 150 mL/hour.      Intervention Category Major Interventions: Acid-Base disturbance - evaluation and management  Deosha Werden Eugene 06/23/2016, 12:50 AM

## 2016-06-23 NOTE — Progress Notes (Signed)
Arterial blood gas attempted-Venous blood obtained, RN made aware.

## 2016-06-23 NOTE — Progress Notes (Signed)
eLink Physician-Brief Progress Note Patient Name: Merril AbbeBillie A Altemus DOB: 11/02/1946 MRN: [id number]   Date of Service  06/23/2016  HPI/Events of Note  K+ = 3.0 and Creatinine = 2.67.  eICU Interventions  Will cautiously replete K+.     Intervention Category Intermediate Interventions: Electrolyte abnormality - evaluation and management  Sommer,Steven Eugene 06/23/2016, 5:18 AM

## 2016-06-23 NOTE — Progress Notes (Signed)
Pt anuric since beginning of shift. Bladder scanned 152 and MD ordered in and out cath. 125 mL obtained. Urine foul smelling and cloudy. Asked for UA. Not sent because ED completed test upon admission with positive result of bacteria. Pt already receiving antibiotics.

## 2016-06-23 NOTE — Plan of Care (Signed)
Problem: Activity: Goal: Ability to perform activities of daily living will improve Outcome: Not Progressing Pt drowsy    Goal: Will verbalize the importance of balancing activity with adequate rest periods Outcome: Not Progressing Pt unable to verbalize needs     Problem: Cardiac: Goal: Ability to maintain an adequate cardiac output will improve Outcome: Not Progressing BP soft. Requiring fluid boluses to maintain adequate pressures.  Problem: Education: Goal: Knowledge of disease or condition will improve Outcome: Not Progressing Pt noncompliant with medications. Aware of diabetes diagnosis but not maintaining with medications and diet. Goal: Knowledge of the prescribed therapeutic regimen will improve Outcome: Not Progressing Pt noncompliant with medications Goal: Ability to manage health-related needs will improve Outcome: Not Progressing Pt unable to care for self. Husband primary caregiver. Goal: Ability to identify and alter actions that are detrimental to health will improve Outcome: Not Progressing Pt not communicating enough to teach. Goal: Ability to describe self-care measures that may prevent or decrease complications (Diabetes Survival Skills Education) will improve Outcome: Not Progressing Pt unable to communicate adequately  Problem: Health Behavior: Goal: Ability to identify and alter actions that are detrimental to health will improve Outcome: Not Progressing Pt unable to communicate adequately Goal: Ability to identify and utilize available resources and services will improve Outcome: Not Progressing Pt unable to communicate adequately. Goal: Ability to manage health-related needs will improve Outcome: Not Progressing Pt requires aid for ADLs.  Problem: Fluid Volume: Goal: Ability to achieve a balanced intake and output will improve Outcome: Not Progressing Pt not putting out any urine. Goal: Will maintain adequate fluid volume Outcome:  Progressing Treating patient with fluids to promote balance.  Problem: Nutritional: Goal: Maintenance of adequate nutrition will improve Outcome: Not Progressing Pt does not have adequate intake at home per daughters.   Problem: Physical Regulation: Goal: Will remain free from infection Outcome: Progressing Being treated with antibiotics.  Problem: Urinary Elimination: Goal: Ability to achieve and maintain adequate renal perfusion and functioning will improve Outcome: Not Progressing Pt labs abnormal. Pt has yet to urinate.

## 2016-06-23 NOTE — H&P (Signed)
PULMONARY / CRITICAL CARE MEDICINE   Name: Cassandra [name] MRN: [id number] DOB: 20-Dec-1945    ADMISSION DATE:  06/22/2016 CONSULTATION DATE:  06/22/2016  REFERRING MD:  Nicanor Bake, M.D. / EDP  CHIEF COMPLAINT:  DKA  HISTORY OF PRESENT ILLNESS:  Patient presented to hospital with 1 day of persistent nausea and vomiting with abdominal pain and cough. Patient remains altered and history obtained from patient's daughter. Patient herself reporting she has a headache and is thirsty. Per daughter patient began to have a poor appetite and possibly 3 days ago feeling ill. Reportedly she did not take her Lantus last evening and began to have nausea and vomiting that same night. Reportedly has had also altered mentation and this time. No recent sick contacts. No recent travel or undercooked foods. Approximately 2 weeks ago was discharged from a skilled nursing facility after hospitalization with septic shock, diabetic ketoacidosis, acute encephalopathy, acute on chronic renal failure, and acute respiratory failure requiring endotracheal intubation.     SUBJECTIVE: Off insulin  VITAL SIGNS: BP 125/57 mmHg  Pulse 82  Temp(Src) 98.9 F (37.2 C) (Oral)  Resp 15  Ht  (1.575 m)  Wt 109 lb 5.6 oz (49.6 kg)  BMI 19.99 kg/m2  SpO2 98%  HEMODYNAMICS:    VENTILATOR SETTINGS:    INTAKE / OUTPUT: I/O last 3 completed shifts: In: 3042.1 [I.V.:1641.9; Other:1000; IV Piggyback:400.2] Out: 201 [Urine:200; Stool:1]  PHYSICAL EXAMINATION: General: Sleeping until awoken. No acute distress. Confused Integument:  Warm & dry. No rash on exposed skin. No bruising. Lymphatics:  No appreciated cervical or supraclavicular lymphadenoapthy. HEENT:  Tacky mucus membranes. No scleral injection or icterus. Cardiovascular:  Regular rate. No edema.Neg JVD.  Pulmonary:  Good aeration & clear to auscultation bilaterally. Symmetric chest wall expansion. No accessory muscle use. Abdomen: Soft. Normal bowel  sounds. Nondistended. Grossly nontender. Musculoskeletal:  Normal bulk and tone. Hand grip strength 5/5 bilaterally. No joint deformity or effusion appreciated. Neurological:  CN 2-12 grossly in tact. No meningismus. Moving all 4 extremities equally. Following commands. Confused on arousal Psychiatric:  Blunted affect. Confused as to place and time LABS:  BMET  Recent Labs Lab 06/22/16 1804 06/22/16 2325 06/23/16 0435  NA 131* 135 138  K 6.1* 3.9 3.0*  CL 100* 108 110  CO2 <7* 11* 19*  BUN 49* 47* 45*  CREATININE 3.26* 3.05* 2.67*  GLUCOSE 875* 431* 108*    Electrolytes  Recent Labs Lab 06/22/16 1804 06/22/16 2030 06/22/16 2325 06/23/16 0435  CALCIUM 7.7*  --  7.1* 7.1*  MG  --  2.3  --  1.8  PHOS  --  5.8*  --  3.5    CBC  Recent Labs Lab 06/22/16 1526 06/23/16 0435  WBC 15.2* 17.1*  HGB 9.2* 7.6*  HCT 31.1* 22.2*  PLT 318 247    Coag's No results for input(s): APTT, INR in the last 168 hours.  Sepsis Markers  Recent Labs Lab 06/22/16 1549 06/22/16 2030 06/23/16 0435  LATICACIDVEN 1.63  --   --   PROCALCITON  --  0.40 0.70    ABG  Recent Labs Lab 06/23/16 0039  PHART 7.284*  PCO2ART 24.7*  PO2ART 42.0*    Liver Enzymes  Recent Labs Lab 06/22/16 1526  AST 16  ALT 14  ALKPHOS 67  BILITOT 1.4*  ALBUMIN 3.5    Cardiac Enzymes No results for input(s): TROPONINI, PROBNP in the last 168 hours.  Glucose  Recent Labs Lab 06/23/16 0333 06/23/16 0431 06/23/16  [id number] 06/23/16 0620 06/23/16 0724 06/23/16 0819  GLUCAP 172* 114* 45* 91 85 109*    Imaging Ct Head Wo Contrast  06/22/2016  CLINICAL DATA:  70 year old female with acute encephalopathy. EXAM: CT HEAD WITHOUT CONTRAST TECHNIQUE: Contiguous axial images were obtained from the base of the skull through the vertex without intravenous contrast. COMPARISON:  Head CT 05/06/2016. FINDINGS: Patchy and confluent areas of decreased attenuation are noted throughout the deep and  periventricular white matter of the cerebral hemispheres bilaterally, compatible with chronic microvascular ischemic disease. No acute intracranial abnormalities. Specifically, no evidence of acute intracranial hemorrhage, no definite findings of acute/subacute cerebral ischemia, no mass, mass effect, hydrocephalus or abnormal intra or extra-axial fluid collections. Visualized paranasal sinuses and mastoids are well pneumatized. No acute displaced skull fractures are identified. IMPRESSION: 1. No acute intracranial abnormalities. 2. Chronic microvascular ischemic changes in cerebral white matter redemonstrated, as above. Electronically Signed   By: Trudie Reedaniel  Entrikin M.D.   On: 06/22/2016 22:30   Dg Chest Portable 1 View  06/22/2016  CLINICAL DATA:  Cough.  History of pneumonia.  Smoker. EXAM: PORTABLE CHEST 1 VIEW COMPARISON:  05/10/2016 chest radiograph. FINDINGS: Surgical hardware from ACDF overlies the lower cervical spine. Stable cardiomediastinal silhouette with normal heart size. No pneumothorax. No pleural effusion. No pulmonary edema. No acute consolidative airspace disease. IMPRESSION: No active disease. Electronically Signed   By: Delbert PhenixJason A Poff M.D.   On: 06/22/2016 15:40     STUDIES:  Port CXR 7/8: No focal opacity or effusion.  MICROBIOLOGY: MRSA PCR 7/8>>>neg Blood Ctx x2 7/8>>> Urine Ctx 7/8>>>  ANTIBIOTICS: Vancomycin 7/8>>> Zosyn 7/8>>>  SIGNIFICANT EVENTS: 7/08 - Admit 7/9 hypoglycemic  LINES/TUBES: PIV x2  DISCUSSION:  70 year old female with known history of type 1 diabetes mellitus. Recent admission with sepsis and acute respiratory failure requiring endotracheal intubation. Patient also had DKA during that admission. Recently discharged from skilled nursing facility with reported urinary tract infection. Urinalysis suspicious for urinary tract infection. Initiating DKA protocol. Continuing to trend electrolytes per protocol. Holding home insulin at this time.    ASSESSMENT / PLAN:  ENDOCRINE CBG (last 3)   Recent Labs  06/23/16 0620 06/23/16 0724 06/23/16 0819  GLUCAP 91 85 109*     A:   DKA H/O DM Type I H/O Hypothyroidism - TSH 15.6. T4 .60  P:   DKA protocol, stopped 7/9 and changed to SSI Holding home Humalog & Lantus Holding Home Synthroid Thiamine & Folic Acid Daily Start diet 7/9  DC bicarb Continue d5 .45 till eating  NEUROLOGIC A:   Acute Encephalopathy - Likely multifactorial. 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 CT Head W/O was negative 7/8 P:   Monitor closely for decompensation Seizure Precautions Holding home Ativan & Lexapro  PULMONARY A: Ongoing Tobacco Use  P:   Continuous Pulse Ox Tobacco Cessation education prior to discharge  CARDIOVASCULAR A:  H/O HTN  P:  Vitals per unit protocol Continuous Telemetry Monitoring Hold Lipitor, Coreg, Norvasc, Plavix, aspirin, & Lasix   RENAL Lab Results  Component Value Date   CREATININE 2.67* 06/23/2016   CREATININE 3.05* 06/22/2016   CREATININE 3.26* 06/22/2016   CREATININE 1.17 06/15/2014   CREATININE 0.74 10/09/2013   CREATININE 0.67 10/08/2013    Recent Labs Lab 06/22/16 1804 06/22/16 2325 06/23/16 0435  K 6.1* 3.9 3.0*     A:   Acute on  CKD Stage 3 Hyperkalemia - Improving. Secondary to acidosis. AGMA - Secondary to DKA.  9/9 AG 10 Pseudohyponatremia  P:   Monitoring UOP Trending electrolytes & renal function per DKA order set, note hypoglycemia Replacing electrolytes as indicated. Bicarb gtt  dc'd 7/9  GASTROINTESTINAL A:   H/O GERD Elevated Lipase - Mild.  P:   Resume low carb diet  HEMATOLOGIC  Recent Labs  06/22/16 1526 06/23/16 0435  HGB 9.2* 7.6*    A:   Leukocytosis - Sepsis vs SIRS. Anemia - Chronic. No signs of active bleeding.  P:  Trending cell counts daily w/ CBC SCDs Heparin  [place] q8hr  INFECTIOUS A:   SIRS/Possible Sepsis 7/9 procal ,70 P:   Continuing empiric Vancomycin & Zosyn Day #2, consider dc soon Awaiting Culture Results Trending PCT per algorithm  FAMILY  - Updates: Daughter updated at bedside by Dr. Jamison Neighbor 7/8.  - Inter-disciplinary family meet or Palliative Care meeting due by:  7/15   Discussion: May be ready for floor later on 7/9  [facility] Minor ACNP [facility] PCCM Pager [phone] till 3 pm If no answer page [phone] 06/23/2016, 9:04 AM  See Progress Note.  Donna Christen Jamison Neighbor, M.D. [facility] Pulmonary & Critical Care Pager:  [phone] After 3pm or if no response, call [phone] 3:16 PM 06/23/2016

## 2016-06-23 NOTE — Progress Notes (Signed)
PULMONARY / CRITICAL CARE MEDICINE   Name: Cassandra [name] MRN: [id number] DOB: 02/11/1946    ADMISSION DATE:  06/22/2016 CONSULTATION DATE:  06/22/2016  REFERRING MD:  Nicanor Bake, M.D. / EDP  CHIEF COMPLAINT:  DKA  HISTORY OF PRESENT ILLNESS:  Patient presented to hospital with 1 day of persistent nausea and vomiting with abdominal pain and cough. Patient remains altered and history obtained from patient's daughter. Patient herself reporting she has a headache and is thirsty. Per daughter patient began to have a poor appetite and possibly 3 days ago feeling ill. Reportedly she did not take her Lantus last evening and began to have nausea and vomiting that same night. Reportedly has had also altered mentation and this time. No recent sick contacts. No recent travel or undercooked foods. Approximately 2 weeks ago was discharged from a skilled nursing facility after hospitalization with septic shock, diabetic ketoacidosis, acute encephalopathy, acute on chronic renal failure, and acute respiratory failure requiring endotracheal intubation.  SUBJECTIVE: Off insulin.  VITAL SIGNS: BP 120/57 mmHg  Pulse 85  Temp(Src) 99 F (37.2 C) (Oral)  Resp 18  Ht 5\' 2"  (1.575 m)  Wt 109 lb 5.6 oz (49.6 kg)  BMI 19.99 kg/m2  SpO2 96%  HEMODYNAMICS:    VENTILATOR SETTINGS:    INTAKE / OUTPUT: I/O last 3 completed shifts: In: 3042.1 [I.V.:1641.9; Other:1000; IV Piggyback:400.2] Out: 201 [Urine:200; Stool:1]  PHYSICAL EXAMINATION: General: Sleeping until awoken. No acute distress. Confused Integument:  Warm & dry. No rash on exposed skin. No bruising. HEENT:  Dry oral mucosa. No scleral injection or icterus. Cardiovascular:  Regular rate. No edema. No appreciable JVD.  Pulmonary:  Good aeration & clear to auscultation bilaterally. Symmetric chest wall expansion. No accessory muscle use. Abdomen: Soft. Normal bowel sounds. Nondistended. Grossly nontender. Musculoskeletal:  Normal bulk and  tone. Hand grip strength 5/5 bilaterally. No joint deformity or effusion appreciated. Neurological:  CN 2-12 grossly in tact. No meningismus. Moving all 4 extremities equally. Following commands. Psychiatric:  Blunted affect. Oriented to person, not place and time  LABS:  BMET  Recent Labs Lab 06/23/16 0435 06/23/16 0814 06/23/16 1119  NA 138 138 134*  K 3.0* 3.9 4.7  CL 110 109 108  CO2 19* 17* 15*  BUN 45* 44* 41*  CREATININE 2.67* 2.59* 2.70*  GLUCOSE 108* 109* 269*    Electrolytes  Recent Labs Lab 06/22/16 2030  06/23/16 0435 06/23/16 0814 06/23/16 1119  CALCIUM  --   < > 7.1* 7.3* 6.9*  MG 2.3  --  1.8 1.8  --   PHOS 5.8*  --  3.5 3.6  --   < > = values in this interval not displayed.  CBC  Recent Labs Lab 06/22/16 1526 06/23/16 0435  WBC 15.2* 17.1*  HGB 9.2* 7.6*  HCT 31.1* 22.2*  PLT 318 247    Coag's No results for input(s): APTT, INR in the last 168 hours.  Sepsis Markers  Recent Labs Lab 06/22/16 1549 06/22/16 2030 06/23/16 0435  LATICACIDVEN 1.63  --   --   PROCALCITON  --  0.40 0.70    ABG  Recent Labs Lab 06/23/16 0039  PHART 7.284*  PCO2ART 24.7*  PO2ART 42.0*    Liver Enzymes  Recent Labs Lab 06/22/16 1526  AST 16  ALT 14  ALKPHOS 67  BILITOT 1.4*  ALBUMIN 3.5    Cardiac Enzymes No results for input(s): TROPONINI, PROBNP in the last 168 hours.  Glucose  Recent Labs Lab 06/23/16  0555 06/23/16 0620 06/23/16 0724 06/23/16 0819 06/23/16 0911 06/23/16 1122  GLUCAP 45* 91 85 109* 173* 276*    Imaging Ct Head Wo Contrast  06/22/2016  CLINICAL DATA:  70 year old female with acute encephalopathy. EXAM: CT HEAD WITHOUT CONTRAST TECHNIQUE: Contiguous axial images were obtained from the base of the skull through the vertex without intravenous contrast. COMPARISON:  Head CT 05/06/2016. FINDINGS: Patchy and confluent areas of decreased attenuation are noted throughout the deep and periventricular white matter of the  cerebral hemispheres bilaterally, compatible with chronic microvascular ischemic disease. No acute intracranial abnormalities. Specifically, no evidence of acute intracranial hemorrhage, no definite findings of acute/subacute cerebral ischemia, no mass, mass effect, hydrocephalus or abnormal intra or extra-axial fluid collections. Visualized paranasal sinuses and mastoids are well pneumatized. No acute displaced skull fractures are identified. IMPRESSION: 1. No acute intracranial abnormalities. 2. Chronic microvascular ischemic changes in cerebral white matter redemonstrated, as above. Electronically Signed   By: Trudie Reed M.D.   On: 06/22/2016 22:30   Dg Chest Portable 1 View  06/22/2016  CLINICAL DATA:  Cough.  History of pneumonia.  Smoker. EXAM: PORTABLE CHEST 1 VIEW COMPARISON:  05/10/2016 chest radiograph. FINDINGS: Surgical hardware from ACDF overlies the lower cervical spine. Stable cardiomediastinal silhouette with normal heart size. No pneumothorax. No pleural effusion. No pulmonary edema. No acute consolidative airspace disease. IMPRESSION: No active disease. Electronically Signed   By: Delbert Phenix M.D.   On: 06/22/2016 15:40     STUDIES:  Port CXR 7/8: No focal opacity or effusion. CT Head W/O 7/8:  No acute intracranial abnormality. Microvascular ischemic changes noted.  MICROBIOLOGY: MRSA PCR 7/8:  Negative Blood Ctx x2 7/8>>> Urine Ctx 7/8>>>  ANTIBIOTICS: Vancomycin 7/8>>> Zosyn 7/8>>>  SIGNIFICANT EVENTS: 7/08 - Admit  LINES/TUBES: PIV x2  DISCUSSION:  70 year old female with known history of type 1 diabetes mellitus. Recent admission with sepsis and acute respiratory failure requiring endotracheal intubation. Patient also had DKA during that admission. Recently discharged from skilled nursing facility with reported urinary tract infection. Urinalysis suspicious for urinary tract infection. Transitioned off DKA protocol to SSI 7/9.  ASSESSMENT /  PLAN:  ENDOCRINE A:   DKA - Resolved. Hypoglycemia - Overnight. Resolved with dextrose IVF. H/O DM Type I H/O Hypothyroidism - TSH 15.6.  T4 0.60  P:   Novolog 8u [place] TID WC Accu-Checks qAC & HS SSI per Moderate Dose Algorithm Continuing D5NS @ 50cc/hr until taking adequate PO Thiamine & Folic Acid Daily IV Restarting home Synthroid 75 mcg daily  NEUROLOGIC A:   Acute Encephalopathy - Likely multifactorial. CT w/o abnormality. 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  P:   Monitor closely for decompensation Seizure Precautions Holding home Ativan & Lexapro  PULMONARY A: Ongoing Tobacco Use  P:   Continuous Pulse Ox Tobacco Cessation education prior to discharge  CARDIOVASCULAR A:  H/O HTN - Currently normotensive.  P:  Vitals per unit protocol Continuous Telemetry Monitoring Hold Lipitor, Coreg, Norvasc, Plavix, aspirin, & Lasix   RENAL Lab Results  Component Value Date   CREATININE 2.70* 06/23/2016   CREATININE 2.59* 06/23/2016   CREATININE 2.67* 06/23/2016   CREATININE 1.17 06/15/2014   CREATININE 0.74 10/09/2013   CREATININE 0.67 10/08/2013    Recent Labs Lab 06/23/16 0435 06/23/16 0814 06/23/16 1119  K 3.0* 3.9 4.7     A:   Acute on Chronic [id number]  Failure Stage 3 - Worsening. Hyperkalemia - Resolved. Secondary to acidosis. Hypokalemia - Replaced overnight. Resolved. AGMA - Resolved. Secondary to DKA. Pseudohyponatremia - Resolved.  P:   Monitoring UOP Trending electrolytes & renal function per DKA order set Replacing electrolytes as indicated. D5NS @ 50cc/hr  GASTROINTESTINAL A:   H/O GERD Elevated Lipase - Mild.  P:   Start low carb diet Holding home Protonix  HEMATOLOGIC  Recent Labs  06/22/16 1526 06/23/16 0435  HGB 9.2* 7.6*    A:   Leukocytosis - Sepsis vs SIRS. Anemia - Chronic. No signs of active  bleeding. Worse likely due to dehydration.  P:  Trending cell counts daily w/ CBC SCDs Heparin [place] q8hr  INFECTIOUS A:   SIRS/Possible Sepsis - Procalcitonin 0.7.  P:   Continuing empiric Vancomycin & Zosyn Day #2 Awaiting Culture Results Trending PCT per algorithm  FAMILY  - Updates: Daughter updated at bedside by Dr. Jamison NeighborNestor 7/8.  - Inter-disciplinary family meet or Palliative Care meeting due by:  7/15  Brett CanalesSteve Minor ACNP [facility] P[facility] Pager [phone] till 3 pm If no answer page [phone] 06/23/2016, 1:46 PM  PCCM Attending Note: Patient seen & examined with nurse practitioner. Please refer to his progress note which I have reviewed in detail. Hypoglycemia overnight has resolved and patient no longer in DKA. Suspect acute encephalopathy is likely multi-factorial at this point. Still question a component of sepsis and continuing empiric antibiotics while awaiting cultures. Overall moving extremities appropriately therefore I do not feel an acute ischemic event is to blame. Continuing ICU monitoring given altered mental status and potential for further clinical decline.  I have spent a total of 37 minutes of critical care time today caring for the patient, discussing the plan of care with the daughter at bedside, and reviewing the patient's electronic medical record.  Donna ChristenJennings E. Jamison NeighborNestor, M.D. [facility] Pager:  [phone] After 3pm or if no response, call [phone] 3:18 PM 06/23/2016

## 2016-06-24 LAB — BASIC METABOLIC PANEL
ANION GAP: 18 — AB (ref 5–15)
ANION GAP: 21 — AB (ref 5–15)
ANION GAP: 20 — AB (ref 5–15)
ANION GAP: 8 (ref 5–15)
Anion gap: 13 (ref 5–15)
Anion gap: 9 (ref 5–15)
BUN: 38 mg/dL — ABNORMAL HIGH (ref 6–20)
BUN: 40 mg/dL — ABNORMAL HIGH (ref 6–20)
BUN: 41 mg/dL — ABNORMAL HIGH (ref 6–20)
BUN: 41 mg/dL — ABNORMAL HIGH (ref 6–20)
BUN: 39 mg/dL — ABNORMAL HIGH (ref 6–20)
BUN: 36 mg/dL — AB (ref 6–20)
CALCIUM: 7.2 mg/dL — AB (ref 8.9–10.3)
CALCIUM: 7.4 mg/dL — AB (ref 8.9–10.3)
CALCIUM: 7.3 mg/dL — AB (ref 8.9–10.3)
CHLORIDE: 104 mmol/L (ref 101–111)
CHLORIDE: 103 mmol/L (ref 101–111)
CHLORIDE: 105 mmol/L (ref 101–111)
CHLORIDE: 109 mmol/L (ref 101–111)
CHLORIDE: 110 mmol/L (ref 101–111)
CO2: 10 mmol/L — AB (ref 22–32)
CO2: 7 mmol/L — ABNORMAL LOW (ref 22–32)
CO2: 7 mmol/L — ABNORMAL LOW (ref 22–32)
CO2: 11 mmol/L — ABNORMAL LOW (ref 22–32)
CO2: 17 mmol/L — ABNORMAL LOW (ref 22–32)
CO2: 18 mmol/L — AB (ref 22–32)
CREATININE: 2.76 mg/dL — AB (ref 0.44–1.00)
CREATININE: 3.12 mg/dL — AB (ref 0.44–1.00)
CREATININE: 2.98 mg/dL — AB (ref 0.44–1.00)
CREATININE: 2.56 mg/dL — AB (ref 0.44–1.00)
Calcium: 7.5 mg/dL — ABNORMAL LOW (ref 8.9–10.3)
Calcium: 7.1 mg/dL — ABNORMAL LOW (ref 8.9–10.3)
Calcium: 7.2 mg/dL — ABNORMAL LOW (ref 8.9–10.3)
Chloride: 110 mmol/L (ref 101–111)
Creatinine, Ser: 2.55 mg/dL — ABNORMAL HIGH (ref 0.44–1.00)
Creatinine, Ser: 2.37 mg/dL — ABNORMAL HIGH (ref 0.44–1.00)
GFR calc Af Amer: 23 mL/min — ABNORMAL LOW (ref >60)
GFR calc non Af Amer: 18 mL/min — ABNORMAL LOW (ref >60)
GFR calc non Af Amer: 17 mL/min — ABNORMAL LOW (ref >60)
GFR calc non Af Amer: 14 mL/min — ABNORMAL LOW (ref >60)
GFR calc non Af Amer: 15 mL/min — ABNORMAL LOW (ref >60)
GFR calc non Af Amer: 18 mL/min — ABNORMAL LOW (ref >60)
GFR calc non Af Amer: 20 mL/min — ABNORMAL LOW (ref >60)
GFR, EST AFRICAN AMERICAN: 21 mL/min — AB (ref >60)
GFR, EST AFRICAN AMERICAN: 19 mL/min — AB (ref >60)
GFR, EST AFRICAN AMERICAN: 17 mL/min — AB (ref >60)
GFR, EST AFRICAN AMERICAN: 18 mL/min — AB (ref >60)
GFR, EST AFRICAN AMERICAN: 21 mL/min — AB (ref >60)
GLUCOSE: 531 mg/dL — AB (ref 65–99)
GLUCOSE: 86 mg/dL (ref 65–99)
Glucose, Bld: 620 mg/dL (ref 65–99)
Glucose, Bld: 611 mg/dL (ref 65–99)
Glucose, Bld: 307 mg/dL — ABNORMAL HIGH (ref 65–99)
Glucose, Bld: 74 mg/dL (ref 65–99)
POTASSIUM: 4.5 mmol/L (ref 3.5–5.1)
POTASSIUM: 4.3 mmol/L (ref 3.5–5.1)
Potassium: 4.3 mmol/L (ref 3.5–5.1)
Potassium: 4.7 mmol/L (ref 3.5–5.1)
Potassium: 3.3 mmol/L — ABNORMAL LOW (ref 3.5–5.1)
Potassium: 3.8 mmol/L (ref 3.5–5.1)
SODIUM: 131 mmol/L — AB (ref 135–145)
SODIUM: 132 mmol/L — AB (ref 135–145)
SODIUM: 133 mmol/L — AB (ref 135–145)
SODIUM: 136 mmol/L (ref 135–145)
Sodium: 132 mmol/L — ABNORMAL LOW (ref 135–145)
Sodium: 136 mmol/L (ref 135–145)

## 2016-06-24 LAB — CBC
HCT: 24 % — ABNORMAL LOW (ref 36.0–46.0)
HEMOGLOBIN: 8.3 g/dL — AB (ref 12.0–15.0)
MCH: 32.2 pg (ref 26.0–34.0)
MCHC: 34.6 g/dL (ref 30.0–36.0)
MCV: 93 fL (ref 78.0–100.0)
Platelets: 232 10*3/uL (ref 150–400)
RBC: 2.58 MIL/uL — AB (ref 3.87–5.11)
RDW: 13 % (ref 11.5–15.5)
WBC: 14.3 10*3/uL — ABNORMAL HIGH (ref 4.0–10.5)

## 2016-06-24 LAB — GLUCOSE, CAPILLARY
GLUCOSE-CAPILLARY: 540 mg/dL — AB (ref 65–99)
GLUCOSE-CAPILLARY: 455 mg/dL — AB (ref 65–99)
GLUCOSE-CAPILLARY: 173 mg/dL — AB (ref 65–99)
GLUCOSE-CAPILLARY: 117 mg/dL — AB (ref 65–99)
GLUCOSE-CAPILLARY: 74 mg/dL (ref 65–99)
GLUCOSE-CAPILLARY: 106 mg/dL — AB (ref 65–99)
GLUCOSE-CAPILLARY: 95 mg/dL (ref 65–99)
Glucose-Capillary: 559 mg/dL (ref 65–99)
Glucose-Capillary: >600 mg/dL (ref 65–99)
Glucose-Capillary: >600 mg/dL (ref 65–99)
Glucose-Capillary: >600 mg/dL (ref 65–99)
Glucose-Capillary: 407 mg/dL — ABNORMAL HIGH (ref 65–99)
Glucose-Capillary: 338 mg/dL — ABNORMAL HIGH (ref 65–99)
Glucose-Capillary: 244 mg/dL — ABNORMAL HIGH (ref 65–99)
Glucose-Capillary: 74 mg/dL (ref 65–99)
Glucose-Capillary: 78 mg/dL (ref 65–99)

## 2016-06-24 LAB — MAGNESIUM: Magnesium: 1.8 mg/dL (ref 1.7–2.4)

## 2016-06-24 LAB — PROCALCITONIN: Procalcitonin: 0.58 ng/mL

## 2016-06-24 LAB — PHOSPHORUS: PHOSPHORUS: 3.6 mg/dL (ref 2.5–4.6)

## 2016-06-24 MED ORDER — DEXTROSE-NACL 5-0.45 % IV SOLN
INTRAVENOUS | Status: DC
  Administered 2016-06-24 – 2016-06-25 (×3): via INTRAVENOUS

## 2016-06-24 MED ORDER — VITAMIN B-1 100 MG PO TABS
100.0000 mg | ORAL_TABLET | Freq: Every day | ORAL | Status: DC
  Administered 2016-06-25 – 2016-06-29 (×5): 100 mg via ORAL
  Filled 2016-06-24 (×5): qty 1

## 2016-06-24 MED ORDER — SODIUM CHLORIDE 0.9 % IV SOLN: INTRAVENOUS | Status: DC

## 2016-06-24 MED ORDER — SODIUM CHLORIDE 0.9 % IV SOLN
INTRAVENOUS | Status: DC
  Administered 2016-06-24: 10:00:00 via INTRAVENOUS

## 2016-06-24 MED ORDER — ADULT MULTIVITAMIN LIQUID CH: 15.0000 mL | Freq: Every day | ORAL | Status: DC

## 2016-06-24 MED ORDER — FOLIC ACID 1 MG PO TABS
1.0000 mg | ORAL_TABLET | Freq: Every day | ORAL | Status: DC
  Administered 2016-06-24 – 2016-06-29 (×6): 1 mg via ORAL
  Filled 2016-06-24 (×6): qty 1

## 2016-06-24 MED ORDER — ACETAMINOPHEN 160 MG/5ML PO SOLN
650.0000 mg | Freq: Four times a day (QID) | ORAL | Status: DC | PRN
  Administered 2016-06-25 – 2016-06-29 (×3): 650 mg
  Filled 2016-06-24 (×4): qty 20.3

## 2016-06-24 MED ORDER — DEXTROSE-NACL 5-0.45 % IV SOLN: INTRAVENOUS | Status: DC

## 2016-06-24 MED ORDER — PANTOPRAZOLE SODIUM 40 MG PO TBEC
40.0000 mg | DELAYED_RELEASE_TABLET | Freq: Every day | ORAL | Status: DC
  Administered 2016-06-24 – 2016-06-29 (×6): 40 mg via ORAL
  Filled 2016-06-24 (×6): qty 1

## 2016-06-24 MED ORDER — ASPIRIN EC 81 MG PO TBEC
81.0000 mg | DELAYED_RELEASE_TABLET | Freq: Every day | ORAL | Status: DC
  Administered 2016-06-24 – 2016-06-29 (×6): 81 mg via ORAL
  Filled 2016-06-24 (×6): qty 1

## 2016-06-24 MED ORDER — POTASSIUM CHLORIDE 20 MEQ/15ML (10%) PO SOLN
40.0000 meq | Freq: Once | ORAL | Status: AC
  Administered 2016-06-24: 40 meq via ORAL
  Filled 2016-06-24: qty 30

## 2016-06-24 MED ORDER — SODIUM CHLORIDE 0.9 % IV SOLN
INTRAVENOUS | Status: DC
  Administered 2016-06-24: 5.4 [IU]/h via INTRAVENOUS
  Filled 2016-06-24: qty 2.5

## 2016-06-24 MED ORDER — ATORVASTATIN CALCIUM 10 MG PO TABS
10.0000 mg | ORAL_TABLET | Freq: Every day | ORAL | Status: DC
  Administered 2016-06-24 – 2016-06-28 (×5): 10 mg via ORAL
  Filled 2016-06-24 (×5): qty 1

## 2016-06-24 MED ORDER — POTASSIUM CHLORIDE 10 MEQ/100ML IV SOLN
10.0000 meq | INTRAVENOUS | Status: AC
  Administered 2016-06-24 (×2): 10 meq via INTRAVENOUS
  Filled 2016-06-24 (×2): qty 100

## 2016-06-24 MED ORDER — ANIMAL SHAPES WITH C & FA PO CHEW
1.0000 | CHEWABLE_TABLET | Freq: Every day | ORAL | Status: DC
  Administered 2016-06-24 – 2016-06-29 (×6): 1 via ORAL
  Filled 2016-06-24 (×6): qty 1

## 2016-06-24 MED ORDER — LORAZEPAM 0.5 MG PO TABS
0.5000 mg | ORAL_TABLET | Freq: Three times a day (TID) | ORAL | Status: DC | PRN
  Administered 2016-06-25 – 2016-06-29 (×5): 0.5 mg via ORAL
  Filled 2016-06-24 (×5): qty 1

## 2016-06-24 MED ORDER — ESCITALOPRAM OXALATE 10 MG PO TABS
10.0000 mg | ORAL_TABLET | Freq: Every day | ORAL | Status: DC
  Administered 2016-06-24 – 2016-06-28 (×5): 10 mg via ORAL
  Filled 2016-06-24 (×5): qty 1

## 2016-06-24 MED ORDER — CLOPIDOGREL BISULFATE 75 MG PO TABS
75.0000 mg | ORAL_TABLET | Freq: Every day | ORAL | Status: DC
  Administered 2016-06-24 – 2016-06-29 (×6): 75 mg via ORAL
  Filled 2016-06-24 (×6): qty 1

## 2016-06-24 MED ORDER — PANTOPRAZOLE SODIUM 40 MG PO TBEC: 40.0000 mg | DELAYED_RELEASE_TABLET | Freq: Two times a day (BID) | ORAL | Status: DC

## 2016-06-24 MED ORDER — POTASSIUM CHLORIDE CRYS ER 20 MEQ PO TBCR: 40.0000 meq | EXTENDED_RELEASE_TABLET | Freq: Once | ORAL | Status: DC

## 2016-06-24 NOTE — Progress Notes (Signed)
Called and spoke with MD Molli KnockYacoub regarding patient's current anion gap, CO2 level, and blood sugar results to determine if patient should be transitioned off insulin drip.  MD advised to continue insulin drip at this time pending next BMP results.

## 2016-06-24 NOTE — Progress Notes (Signed)
CRITICAL VALUE ALERT  Critical value received:  Glucose 531  Date of notification:  06/24/2016  Time of notification:  0435  Critical value read back:Yes.    Confirmed blood glucose value with capillary finger stick.  Called eLink, spoke with MD Arsenio LoaderSommer, new orders received.

## 2016-06-24 NOTE — Progress Notes (Signed)
eLink Physician-Brief Progress Note Patient Name: Cassandra [name] DOB: 04-10-1946 MRN: [id number]   Date of Service  06/24/2016  HPI/Events of Note  Hyperlycemia - Blood glucose = 531, HCO3 = 10 and AG = 18. Patient is back in DKA.  eICU Interventions  Will restart insulin IV infusion. DKA protocol.      Intervention Category Major Interventions: Hyperglycemia - active titration of insulin therapy  Sommer,Steven Dennard Nipugene 06/24/2016, 4:51 AM

## 2016-06-24 NOTE — Progress Notes (Signed)
eLink Physician-Brief Progress Note Patient Name: Cassandra AbbeBillie A [name] DOB: 18-Jun-1946 MRN: [id number]   Date of Service  06/24/2016  HPI/Events of Note  Pain Hypokalemia  eICU Interventions  Tylenol Potassium     Intervention Category Major Interventions: Other:  Divine Hansley 06/24/2016, 8:04 PM

## 2016-06-24 NOTE — Care Management Important Message (Signed)
Important Message  Patient Details  Name: Cassandra [name] MRN: [id number] Date of Birth: 1946-10-23   Medicare Important Message Given:  Yes    Bernadette HoitShoffner, Hermes Wafer Coleman 06/24/2016, 8:08 AM

## 2016-06-24 NOTE — Progress Notes (Signed)
PULMONARY / CRITICAL CARE MEDICINE   Name: Cassandra [name] MRN: [id number] DOB: 12/12/1946    ADMISSION DATE:  06/22/2016 CONSULTATION DATE:  06/22/2016  REFERRING MD:  Nicanor Bake, M.D. / EDP  CHIEF COMPLAINT:  DKA  HISTORY OF PRESENT ILLNESS:  70 yo with n/v, abd pain, cough, altered mental status from DKA.  SUBJECTIVE:  Feels weak.  Still has cough.  Blood sugar back up >> back on insulin gtt.  VITAL SIGNS: BP 142/61 mmHg  Pulse 94  Temp(Src) 98.9 F (37.2 C) (Oral)  Resp 24  Ht 5\' 2"  (1.575 m)  Wt 109 lb 9.1 oz (49.7 kg)  BMI 20.04 kg/m2  SpO2 100%  INTAKE / OUTPUT: I/O last 3 completed shifts: In: 5363.1 [P.O.:360; I.V.:3152.9; Other:1000; IV Piggyback:850.2] Out: 326 [Urine:325; Stool:1]  PHYSICAL EXAMINATION: General: alert, laying in bed Neuro: follows commands HEENT: no stridor Cardiac: regular Chest: scattered crackles Abd: soft, non tender Ext: no edema Skin: no rashes  LABS:  BMET  Recent Labs Lab 06/24/16 0403 06/24/16 0529 06/24/16 0834  NA 132* 131* 132*  K 4.3 4.5 4.3  CL 104 103 105  CO2 10* 7* 7*  BUN 38* 40* 41*  CREATININE 2.55* 2.76* 3.12*  GLUCOSE 531* 620* 611*    Electrolytes  Recent Labs Lab 06/23/16 0435 06/23/16 0814  06/24/16 0403 06/24/16 0529 06/24/16 0834  CALCIUM 7.1* 7.3*  < > 7.2* 7.5* 7.1*  MG 1.8 1.8  --  1.8  --   --   PHOS 3.5 3.6  --  3.6  --   --   < > = values in this interval not displayed.  CBC  Recent Labs Lab 06/22/16 1526 06/23/16 0435 06/24/16 0403  WBC 15.2* 17.1* 14.3*  HGB 9.2* 7.6* 8.3*  HCT 31.1* 22.2* 24.0*  PLT 318 247 232    Coag's No results for input(s): APTT, INR in the last 168 hours.  Sepsis Markers  Recent Labs Lab 06/22/16 1549 06/22/16 2030 06/23/16 0435 06/24/16 0403  LATICACIDVEN 1.63  --   --   --   PROCALCITON  --  0.40 0.70 0.58    ABG  Recent Labs Lab 06/23/16 0039  PHART 7.284*  PCO2ART 24.7*  PO2ART 42.0*    Liver Enzymes  Recent  Labs Lab 06/22/16 1526  AST 16  ALT 14  ALKPHOS 67  BILITOT 1.4*  ALBUMIN 3.5    Cardiac Enzymes No results for input(s): TROPONINI, PROBNP in the last 168 hours.  Glucose  Recent Labs Lab 06/23/16 2224 06/24/16 0439 06/24/16 0700 06/24/16 0805 06/24/16 0912 06/24/16 1002  GLUCAP 231* 559* >600* >600* >600* 540*    Imaging No results found.   STUDIES:  7/08 CT head >> chronic microvascular ischemic changes  MICROBIOLOGY: 7/08 Blood >>  7/08 Urine >>  ANTIBIOTICS: 7/08 Vancomycin >> 7/08 Zosyn >>   SIGNIFICANT EVENTS: 7/08 Admit  LINES/TUBES:  ASSESSMENT / PLAN:  ENDOCRINE A:   DKA with difficult to control blood sugars. Hx of hypothyroidism. P:   Continue insulin gtt until anion gap closed D5 1/2 NS IV fluid at 75 ml/hr Continue synthroid  NEUROLOGIC A:   Acute metabolic encephalopathy from DKA and sepsis. Hx of depression, anxiety, bipolar, CVA, ETOH. P:   Resume outpt lexapro Prn ativan Thiamine, folic acid  PULMONARY A: Tobacco abuse. P:   Smoking cessation  CARDIOVASCULAR A:  Hx of HTN, HLD. P:  Resume lipitor, plavix, ASA Hold outpt lasix, coreg, norvasc for now   RENAL A:  AKI. Hx of stage 3 CKD. P:   Monitor renal fx, urine outpt  GASTROINTESTINAL A:   Nutrition. Hx of GERD. P:   Clear liquid diet Protonix  HEMATOLOGIC A:   Anemia of critical illness. P:  F/u CBC Heparin [place] q8hr  INFECTIOUS A:   Sepsis with concern for UTI. P:   Day 3 vancomycin, zosyn  CC time 32 minutes  Coralyn HellingVineet Kanav Kazmierczak, MD [facility] 06/24/2016, 11:11 AM Pager:  [phone] After 3pm call: [phone]

## 2016-06-24 NOTE — Progress Notes (Signed)
MD aware of consistently high CBG, orders to follow Glucose Stabilizer protocol. Will continue to monitor closely.

## 2016-06-24 NOTE — Progress Notes (Signed)
Patient stated unable to swallow pills, had pharmacy switch potassium to liquid form.  RN diluted Potassium in water but patient only consumed approximately half of the ordered potassium dose before refusing to drink anymore.  RN to follow up with MD pending next potassium lab result for additional orders if needed.

## 2016-06-24 NOTE — Progress Notes (Signed)
Called and spoke with MD Molli KnockYacoub regarding patient's BMP results.  MD advised to continue current DKA protocol until CO2 value is =/> 20.

## 2016-06-24 NOTE — Progress Notes (Signed)
Inpatient Diabetes Program Recommendations  AACE/ADA: New Consensus Statement on Inpatient Glycemic Control (2015)  Target Ranges:  Prepandial:   less than 140 mg/dL      Peak postprandial:   less than 180 mg/dL (1-2 hours)      Critically ill patients:  140 - 180 mg/dL   Lab Results  Component Value Date   GLUCAP >600* 06/24/2016   HGBA1C 7.3* 05/17/2016    Review of Glycemic Control:  Note that CBG's increased this morning >600 mg/dL.    Diabetes history: Type 1 diabetes Outpatient Diabetes medications: Lantus 10 units daily, Humalog 10 units tid with meals Current orders for Inpatient glycemic control: DKA protocol- IV insulin  Inpatient Diabetes Program Recommendations:    Note that patient did not receive basal insulin on 06/23/16 upon d/c of insulin drip.  Blood sugars increased>600 mg/dL this morning.  Agree with restart of IV insulin/DKA protocol.  Patient will need dextrose in IV fluids when CBG's less than 250 mg/dL.  Once acidosis cleared, patient will need basal insulin 2 hours prior to d/c of insulin drip.  Thanks, Beryl MeagerJenny Jonavan Vanhorn, RN, BC-ADM Inpatient Diabetes Coordinator Pager [phone] (8a-5p)

## 2016-06-25 DIAGNOSIS — I10 Essential (primary) hypertension: Secondary | ICD-10-CM

## 2016-06-25 DIAGNOSIS — R05 Cough: Secondary | ICD-10-CM

## 2016-06-25 LAB — BASIC METABOLIC PANEL
ANION GAP: 7 (ref 5–15)
ANION GAP: 8 (ref 5–15)
ANION GAP: 12 (ref 5–15)
Anion gap: 10 (ref 5–15)
BUN: 33 mg/dL — ABNORMAL HIGH (ref 6–20)
BUN: 32 mg/dL — AB (ref 6–20)
BUN: 27 mg/dL — AB (ref 6–20)
BUN: 24 mg/dL — AB (ref 6–20)
CALCIUM: 7.1 mg/dL — AB (ref 8.9–10.3)
CHLORIDE: 109 mmol/L (ref 101–111)
CHLORIDE: 109 mmol/L (ref 101–111)
CHLORIDE: 111 mmol/L (ref 101–111)
CO2: 16 mmol/L — AB (ref 22–32)
CO2: 19 mmol/L — ABNORMAL LOW (ref 22–32)
CO2: 14 mmol/L — ABNORMAL LOW (ref 22–32)
CO2: 17 mmol/L — AB (ref 22–32)
CREATININE: 2.13 mg/dL — AB (ref 0.44–1.00)
Calcium: 7.7 mg/dL — ABNORMAL LOW (ref 8.9–10.3)
Calcium: 7.4 mg/dL — ABNORMAL LOW (ref 8.9–10.3)
Calcium: 7.6 mg/dL — ABNORMAL LOW (ref 8.9–10.3)
Chloride: 112 mmol/L — ABNORMAL HIGH (ref 101–111)
Creatinine, Ser: 2.11 mg/dL — ABNORMAL HIGH (ref 0.44–1.00)
Creatinine, Ser: 1.96 mg/dL — ABNORMAL HIGH (ref 0.44–1.00)
Creatinine, Ser: 1.71 mg/dL — ABNORMAL HIGH (ref 0.44–1.00)
GFR calc Af Amer: 26 mL/min — ABNORMAL LOW (ref >60)
GFR calc non Af Amer: 30 mL/min — ABNORMAL LOW (ref >60)
GFR, EST AFRICAN AMERICAN: 27 mL/min — AB (ref >60)
GFR, EST AFRICAN AMERICAN: 29 mL/min — AB (ref >60)
GFR, EST AFRICAN AMERICAN: 34 mL/min — AB (ref >60)
GFR, EST NON AFRICAN AMERICAN: 23 mL/min — AB (ref >60)
GFR, EST NON AFRICAN AMERICAN: 23 mL/min — AB (ref >60)
GFR, EST NON AFRICAN AMERICAN: 25 mL/min — AB (ref >60)
GLUCOSE: 138 mg/dL — AB (ref 65–99)
Glucose, Bld: 107 mg/dL — ABNORMAL HIGH (ref 65–99)
Glucose, Bld: 232 mg/dL — ABNORMAL HIGH (ref 65–99)
Glucose, Bld: 126 mg/dL — ABNORMAL HIGH (ref 65–99)
POTASSIUM: 3.7 mmol/L (ref 3.5–5.1)
POTASSIUM: 3.8 mmol/L (ref 3.5–5.1)
POTASSIUM: 3.5 mmol/L (ref 3.5–5.1)
POTASSIUM: 3.7 mmol/L (ref 3.5–5.1)
SODIUM: 136 mmol/L (ref 135–145)
SODIUM: 138 mmol/L (ref 135–145)
Sodium: 135 mmol/L (ref 135–145)
Sodium: 135 mmol/L (ref 135–145)

## 2016-06-25 LAB — TYPE AND SCREEN
ABO/RH(D): A POS
Antibody Screen: NEGATIVE

## 2016-06-25 LAB — CBC
HCT: 19.6 % — ABNORMAL LOW (ref 36.0–46.0)
Hemoglobin: 6.6 g/dL — CL (ref 12.0–15.0)
MCH: 30.7 pg (ref 26.0–34.0)
MCHC: 33.7 g/dL (ref 30.0–36.0)
MCV: 91.2 fL (ref 78.0–100.0)
PLATELETS: 212 10*3/uL (ref 150–400)
RBC: 2.15 MIL/uL — ABNORMAL LOW (ref 3.87–5.11)
RDW: 12.9 % (ref 11.5–15.5)
WBC: 10.4 10*3/uL (ref 4.0–10.5)

## 2016-06-25 LAB — GLUCOSE, CAPILLARY
GLUCOSE-CAPILLARY: 112 mg/dL — AB (ref 65–99)
GLUCOSE-CAPILLARY: 114 mg/dL — AB (ref 65–99)
GLUCOSE-CAPILLARY: 116 mg/dL — AB (ref 65–99)
GLUCOSE-CAPILLARY: 145 mg/dL — AB (ref 65–99)
GLUCOSE-CAPILLARY: 162 mg/dL — AB (ref 65–99)
GLUCOSE-CAPILLARY: 162 mg/dL — AB (ref 65–99)
GLUCOSE-CAPILLARY: 178 mg/dL — AB (ref 65–99)
GLUCOSE-CAPILLARY: 203 mg/dL — AB (ref 65–99)
GLUCOSE-CAPILLARY: 90 mg/dL (ref 65–99)
GLUCOSE-CAPILLARY: 99 mg/dL (ref 65–99)
Glucose-Capillary: 150 mg/dL — ABNORMAL HIGH (ref 65–99)
Glucose-Capillary: 176 mg/dL — ABNORMAL HIGH (ref 65–99)
Glucose-Capillary: 196 mg/dL — ABNORMAL HIGH (ref 65–99)
Glucose-Capillary: 114 mg/dL — ABNORMAL HIGH (ref 65–99)
Glucose-Capillary: 151 mg/dL — ABNORMAL HIGH (ref 65–99)
Glucose-Capillary: 190 mg/dL — ABNORMAL HIGH (ref 65–99)
Glucose-Capillary: 222 mg/dL — ABNORMAL HIGH (ref 65–99)
Glucose-Capillary: 234 mg/dL — ABNORMAL HIGH (ref 65–99)
Glucose-Capillary: 251 mg/dL — ABNORMAL HIGH (ref 65–99)

## 2016-06-25 LAB — PHOSPHORUS: PHOSPHORUS: 2 mg/dL — AB (ref 2.5–4.6)

## 2016-06-25 LAB — HEMOGLOBIN AND HEMATOCRIT, BLOOD
HCT: 22 % — ABNORMAL LOW (ref 36.0–46.0)
Hemoglobin: 7.5 g/dL — ABNORMAL LOW (ref 12.0–15.0)

## 2016-06-25 LAB — ABO/RH: ABO/RH(D): A POS

## 2016-06-25 LAB — MAGNESIUM: MAGNESIUM: 1.6 mg/dL — AB (ref 1.7–2.4)

## 2016-06-25 MED ORDER — MAGNESIUM SULFATE 2 GM/50ML IV SOLN
2.0000 g | Freq: Once | INTRAVENOUS | Status: AC
Start: 2016-06-25 — End: 2016-06-25
  Administered 2016-06-25: 2 g via INTRAVENOUS
  Filled 2016-06-25: qty 50

## 2016-06-25 MED ORDER — PIPERACILLIN-TAZOBACTAM 3.375 G IVPB
3.3750 g | Freq: Three times a day (TID) | INTRAVENOUS | Status: DC
  Filled 2016-06-25 (×2): qty 50

## 2016-06-25 MED ORDER — INSULIN GLARGINE 100 UNIT/ML ~~LOC~~ SOLN
15.0000 [IU] | Freq: Every day | SUBCUTANEOUS | Status: DC
  Administered 2016-06-25: 15 [IU] via SUBCUTANEOUS
  Filled 2016-06-25 (×2): qty 0.15

## 2016-06-25 MED ORDER — INSULIN ASPART 100 UNIT/ML ~~LOC~~ SOLN: 0.0000 [IU] | Freq: Every day | SUBCUTANEOUS | Status: DC

## 2016-06-25 MED ORDER — HYDROCORTISONE 2.5 % RE CREA
TOPICAL_CREAM | Freq: Two times a day (BID) | RECTAL | Status: DC
  Administered 2016-06-25: 13:00:00 via RECTAL
  Filled 2016-06-25: qty 28.35

## 2016-06-25 MED ORDER — INSULIN ASPART 100 UNIT/ML ~~LOC~~ SOLN
0.0000 [IU] | Freq: Three times a day (TID) | SUBCUTANEOUS | Status: DC
  Administered 2016-06-25: 3 [IU] via SUBCUTANEOUS
  Administered 2016-06-26: 1 [IU] via SUBCUTANEOUS
  Administered 2016-06-28: 2 [IU] via SUBCUTANEOUS
  Administered 2016-06-28: 1 [IU] via SUBCUTANEOUS

## 2016-06-25 MED ORDER — BENZONATATE 100 MG PO CAPS
200.0000 mg | ORAL_CAPSULE | Freq: Three times a day (TID) | ORAL | Status: DC | PRN
  Filled 2016-06-25: qty 2

## 2016-06-25 MED ORDER — AMLODIPINE BESYLATE 5 MG PO TABS
5.0000 mg | ORAL_TABLET | Freq: Every day | ORAL | Status: DC
  Administered 2016-06-25 – 2016-06-29 (×5): 5 mg via ORAL
  Filled 2016-06-25 (×5): qty 1

## 2016-06-25 MED ORDER — RISAQUAD PO CAPS
2.0000 | ORAL_CAPSULE | Freq: Every day | ORAL | Status: DC
  Administered 2016-06-25 – 2016-06-29 (×5): 2 via ORAL
  Filled 2016-06-25 (×5): qty 2

## 2016-06-25 MED ORDER — CARVEDILOL 6.25 MG PO TABS
6.2500 mg | ORAL_TABLET | Freq: Two times a day (BID) | ORAL | Status: DC
  Administered 2016-06-25 – 2016-06-26 (×2): 6.25 mg via ORAL
  Filled 2016-06-25 (×2): qty 1

## 2016-06-25 NOTE — Progress Notes (Signed)
Spoke w pt. She was recently in snf. She is home w husband now. She states act w homecare but does not know name of agency. Gets hhpt and hhrn. Will try and find out which agency seeing pt. She buys needles but has not been getting prescripton and getting insulin needles w insurance. Enc pt to get prescripton to get ins syringes to help w cost. Pt has [facility] medicare ins. Pt does not want to go back to snf but will go home w husband at disch.

## 2016-06-25 NOTE — Progress Notes (Signed)
Spoke briefly with patient regarding home diabetes management.  She states that her blood sugars had been up and down prior to admit.  She admits that she did not check her blood sugars in the evening.  She was having lows in the middle of the night and then going up in the 300's for which she administered Humalog.  However when she took the Humalog, she did not take her Lantus because she thought she should not take both insulins.  Explained the difference between Humalog and Lantus and the need for both insulins due to their difference in action. Upon review of Care Everywhere, patient was a no show for Dr. Lucianne MussKumar and Dr. Tedd SiasSolum this year?  She states that she does not have a MD who manages her blood sugars/diabetes??  Patient appears very sensitive to insulin.  Needs MD to assist in management of diabetes.  Unclear why she has not gone to appointments?  Needs close follow-up.  Also states she was re-using insulin syringes for 2-3 days due to cost?   Will follow-up on 06/26/16.  Thanks, Beryl MeagerJenny Skye Rodarte, RN, BC-ADM Inpatient Diabetes Coordinator Pager [phone] (8a-5p)

## 2016-06-25 NOTE — Progress Notes (Signed)
Pharmacy Antibiotic Note  Cassandra AbbeBillie A [name] is a 70 y.o. female admitted on 06/22/2016 with sepsis.  Pharmacy has been consulted for vanc/zosyn dosing. Afeb, WBC wnl and sCr trending down to 2.11.  Pt has been on zosyn 2.25g q8h and vancomycin 500mg  q48h. Will improvement in renal function, will check vancomycin trough tonight, approximately 24h after the last dose.  Plan: Increase zosyn to 3.375g IV q8h Continue vancomycin 500mg  IV q48h Monitor clinical progress, c/s, renal function, abx plan/LOT VT at SS prn   Height: 5\' 2"  (157.5 cm) Weight: 118 lb 13.3 oz (53.9 kg) IBW/kg (Calculated) : 50.1  Temp (24hrs), Avg:98.5 F (36.9 C), Min:98.1 F (36.7 C), Max:99 F (37.2 C)   Recent Labs Lab 06/22/16 1526 06/22/16 1549  06/23/16 0435  06/24/16 0403  06/24/16 1304 06/24/16 1712 06/24/16 2110 06/25/16 0308 06/25/16 0538  WBC 15.2*  --   --  17.1*  --  14.3*  --   --   --   --  10.4  --   CREATININE 3.18*  --   < > 2.67*  < > 2.55*  < > 2.98* 2.56* 2.37* 2.13* 2.11*  LATICACIDVEN  --  1.63  --   --   --   --   --   --   --   --   --   --   < > = values in this interval not displayed.  Estimated Creatinine Clearance: 20.2 mL/min (by C-G formula based on Cr of 2.11).    Allergies  Allergen Reactions  . Ciprofloxacin Other (See Comments)    Pt does not recall specific reaction  . Codeine Diarrhea and Nausea And Vomiting  . Doxycycline Diarrhea and Nausea And Vomiting  . Hydrocodone Other (See Comments)    Pt does not recall specific reaction  . Omnicef [Cefdinir] Nausea Only and Other (See Comments)    Constipation, tolerated Zosyn    Antimicrobials this admission: Vanc 7/8>>  Zosyn 7/8>>   Dose adjustments this admission:   Microbiology results: 7/8 BCx2: ngtd 7/8 UCx: neg 7/8 MRSA PCR neg    Arlean Hoppingorey M. Newman PiesBall, PharmD, BCPS Clinical Pharmacist Pager [phone]  06/25/2016 9:01 AM

## 2016-06-25 NOTE — Progress Notes (Signed)
Called phlebotomy, 0130 BMP still has not been collected. Requested lab be collected as soon as possible due to scheduled collection time was set for 0130.

## 2016-06-25 NOTE — Progress Notes (Signed)
Patients hemoglobin 6.6, called eLink and spoke with MD Jamison NeighborNestor.  Patients morning phosphorous and magnesium levels also reported along with BMP results.  MD advised to continue DKA protocol

## 2016-06-25 NOTE — Progress Notes (Signed)
NURSING PROGRESS NOTE  Merril AbbeBillie A Magid [id number] Transfer Data: 06/25/2016 1:54 PM Attending Provider: Roslynn AmbleJennings E Nestor, MD JOA:CZYSAYT,KZSWFUPCP:THACKER,ROBERT KELLER, MD Code Status: Full Code   Merril AbbeBillie A Hollis is a 70 y.o. female patient transferred from 2H  -No acute distress noted.  -No complaints of shortness of breath.  -No complaints of chest pain.    Blood pressure 147/69, pulse 73, temperature 98.5 F (36.9 C), temperature source Oral, resp. rate 18, height 5\' 2"  (1.575 m), weight 53.9 kg (118 lb 13.3 oz), SpO2 95 %.   IV Fluids:  IV in place, CDI.  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  Past Surgical History:   has past surgical history that includes Appendectomy; Tonsillectomy; Cholecystectomy; Hernia repair; Cataract extraction; Abdominal hysterectomy; Bladder surgery; and Cervical disc surgery.  Social History:   reports that she has been smoking Cigarettes.  She has been smoking about 0.25 packs per day. She has never used smokeless tobacco. She reports that she does not drink alcohol or use illicit drugs.    Patient/Family orientated to room. Information packet given to patient/family. Admission inpatient armband information verified with  patient/family to include name and date of birth and placed on patient arm. Side rails up x 2, fall assessment and education completed with patient/family. Patient/family able to verbalize understanding of risk associated with falls and verbalized understanding to call for assistance before getting out of bed. Call light within reach. Patient/family able to voice and demonstrate understanding of unit orientation instructions.    Will continue to evaluate and treat per MD orders.

## 2016-06-25 NOTE — Progress Notes (Signed)
PULMONARY / CRITICAL CARE MEDICINE   Name: Cassandra [name] MRN: [id number] DOB: 09-May-1946    ADMISSION DATE:  06/22/2016 CONSULTATION DATE:  06/22/2016  REFERRING MD:  Nicanor Bake, M.D. / EDP  CHIEF COMPLAINT:  DKA  HISTORY OF PRESENT ILLNESS:  70 yo with n/v, abd pain, cough, altered mental status from DKA.  SUBJECTIVE:  Anion gap closed Hgb down from admission but no bleeding Still on insulin gtt Has a dry cough No dysuria Wants to eat  VITAL SIGNS: BP 170/79 mmHg  Pulse 84  Temp(Src) 98.1 F (36.7 C) (Oral)  Resp 14  Ht  (1.575 m)  Wt 118 lb 13.3 oz (53.9 kg)  BMI 21.73 kg/m2  SpO2 94%  INTAKE / OUTPUT: I/O last 3 completed shifts: In: 3354.5 [P.O.:360; I.V.:2494.5; IV Piggyback:500] Out: 1000 [Urine:1000]  PHYSICAL EXAMINATION: General: sitting up in bed, comfortable HENT: NCAT OP clear PULM: CTA B CV: RRR, pronounced P2, no JVD GI: BS+, soft, nontender MSK: normal bulk and tone Neuro: A&Ox4, maew  LABS:  BMET  Recent Labs Lab 06/24/16 2110 06/25/16 0308 06/25/16 0538  NA 136 135 136  K 3.8 3.7 3.8  CL 110 112* 109  CO2 18* 16* 19*  BUN 36* 33* 32*  CREATININE 2.37* 2.13* 2.11*  GLUCOSE 86 138* 107*    Electrolytes  Recent Labs Lab 06/23/16 0814  06/24/16 0403  06/24/16 2110 06/25/16 0308 06/25/16 0538  CALCIUM 7.3*  < > 7.2*  < > 7.3* 7.1* 7.7*  MG 1.8  --  1.8  --   --  1.6*  --   PHOS 3.6  --  3.6  --   --  2.0*  --   < > = values in this interval not displayed.  CBC  Recent Labs Lab 06/23/16 0435 06/24/16 0403 06/25/16 0308 06/25/16 0538  WBC 17.1* 14.3* 10.4  --   HGB 7.6* 8.3* 6.6* 7.5*  HCT 22.2* 24.0* 19.6* 22.0*  PLT 247 232 212  --     Coag's No results for input(s): APTT, INR in the last 168 hours.  Sepsis Markers  Recent Labs Lab 06/22/16 1549 06/22/16 2030 06/23/16 0435 06/24/16 0403  LATICACIDVEN 1.63  --   --   --   PROCALCITON  --  0.40 0.70 0.58    ABG  Recent Labs Lab  06/23/16 0039  PHART 7.284*  PCO2ART 24.7*  PO2ART 42.0*    Liver Enzymes  Recent Labs Lab 06/22/16 1526  AST 16  ALT 14  ALKPHOS 67  BILITOT 1.4*  ALBUMIN 3.5    Cardiac Enzymes No results for input(s): TROPONINI, PROBNP in the last 168 hours.  Glucose  Recent Labs Lab 06/25/16 0121 06/25/16 0215 06/25/16 0317 06/25/16 0418 06/25/16 0519 06/25/16 0624  GLUCAP 196* 162* 145* 114* 114* 112*    Imaging No results found.   STUDIES:  7/08 CT head >> chronic microvascular ischemic changes  MICROBIOLOGY: 7/08 Blood >>  7/08 Urine >>  7/8 Urinalysis without WBC  ANTIBIOTICS: 7/08 Vancomycin >> 7/11 7/08 Zosyn >> 7/11  SIGNIFICANT EVENTS: 7/08 Admit  LINES/TUBES:  ASSESSMENT / PLAN:  ENDOCRINE A:   DKA with difficult to control blood sugars > improved Hx of hypothyroidism P:   Stop insulin gtt, change to glargine plus sensitive scale sliding insulin Stop D5 1/2 NS IV fluid  Diabetic educator Diabetic diet Continue synthroid  NEUROLOGIC A:   Acute metabolic encephalopathy from DKA > resolved Hx of depression, anxiety, bipolar, CVA, ETOH use  P:   Continue outpt lexapro Prn ativan Thiamine, folic acid  PULMONARY A: Tobacco abuse Dry cough, CXR clear P:   Smoking cessation counseling Tessalon prn  CARDIOVASCULAR A:  Hx of HTN, HLD P:  Continue lipitor, plavix, ASA Hold outpt lasix,  Add back coreg (1/2 home dose), norvasc for now; escalate coreg daily  RENAL A:   AKI > improving Hx of stage 3 CKD P:   Monitor BMET and UOP Replace electrolytes as needed  GASTROINTESTINAL A:   Nutrition needs Hx of GERD P:   Clear liquid diet Protonix  HEMATOLOGIC A:   Anemia of critical illness P:  F/u CBC Heparin [place] q8hr  INFECTIOUS A:   No evidence of infection P:   Stop vancomycin, zosyn   Heber CarolinaBrent Meka Lewan, MD [facility] Pager: [phone] Cell: [phone] After 3pm or if no response, call [phone]

## 2016-06-25 NOTE — Progress Notes (Signed)
eLink Physician-Brief Progress Note Patient Name: Cassandra AbbeBillie A [name] DOB: January 07, 1946 MRN: [id number]   Date of Service  06/25/2016  HPI/Events of Note  Notified by bedside nurse of new anemia with hemoglobin 6.6. Patient is having intermittent loose stool but no melena or hematochezia. Additionally patient's serum magnesium 1.6 & phosphorus 2.0 this morning.   eICU Interventions  1. Stat hemoglobin/hematocrit to confirm 2. Stat type and screen 3. Magnesium sulfate 2 g IV      Intervention Category Intermediate Interventions: Other:;Electrolyte abnormality - evaluation and management  Cassandra [name] 06/25/2016, 5:16 AM

## 2016-06-25 NOTE — Progress Notes (Signed)
Called phlebotomy to check on collection of 0130 BMP, was advised phlebotomist was currently on the unit collecting labs and would be collecting this lab.

## 2016-06-25 NOTE — Progress Notes (Signed)
Inpatient Diabetes Program Recommendations  AACE/ADA: New Consensus Statement on Inpatient Glycemic Control (2015)  Target Ranges:  Prepandial:   less than 140 mg/dL      Peak postprandial:   less than 180 mg/dL (1-2 hours)      Critically ill patients:  140 - 180 mg/dL   Lab Results  Component Value Date   GLUCAP 112* 06/25/2016   HGBA1C 7.3* 05/17/2016  Results for [name], Cassandra A (MRN [id number]) as of 06/25/2016 09:32  Ref. Range 06/25/2016 05:38  Sodium Latest Ref Range: 135-145 mmol/L 136  Potassium Latest Ref Range: 3.5-5.1 mmol/L 3.8  Chloride Latest Ref Range: 101-111 mmol/L 109  CO2 Latest Ref Range: 22-32 mmol/L 19 (L)  BUN Latest Ref Range: 6-20 mg/dL 32 (H)  Creatinine Latest Ref Range: 0.44-1.00 mg/dL 2.11 (H)  Calcium Latest Ref Range: 8.9-10.3 mg/dL 7.7 (L)  EGFR (Non-African Amer.) Latest Ref Range: >60 mL/min 23 (L)  EGFR (African American) Latest Ref Range: >60 mL/min 27 (L)  Glucose Latest Ref Range: 65-99 mg/dL 107 (H)  Anion gap Latest Ref Range: 5-15  8    Review of Glycemic Control:  Results for Cassandra, [name] (MRN [id number]) as of 06/25/2016 09:32  Ref. Range 06/25/2016 02:15 06/25/2016 03:17 06/25/2016 04:18 06/25/2016 05:19 06/25/2016 06:24  Glucose-Capillary Latest Ref Range: 65-99 mg/dL 162 (H) 145 (H) 114 (H) 114 (H) 112 (H)   Diabetes history: Type 1 diabetes Outpatient Diabetes medications: Lantus 10 units daily, Humalog 10 units tid with meals Current orders for Inpatient glycemic control: DKA protocol- IV insulin  Inpatient Diabetes Program Recommendations:    Note that CO2 improved and AG closed.  Upon transition off insulin drip, please consider Lantus 10 units at least 1-2 hours prior to d/c of drip.  Also consider q 4 hour sensitive Novolog correction.    Thanks, Adah Perl, RN, BC-ADM Inpatient Diabetes Coordinator Pager [phone] (8a-5p)

## 2016-06-26 DIAGNOSIS — E1010 Type 1 diabetes mellitus with ketoacidosis without coma: Principal | ICD-10-CM

## 2016-06-26 DIAGNOSIS — K219 Gastro-esophageal reflux disease without esophagitis: Secondary | ICD-10-CM

## 2016-06-26 DIAGNOSIS — E785 Hyperlipidemia, unspecified: Secondary | ICD-10-CM

## 2016-06-26 LAB — BASIC METABOLIC PANEL
ANION GAP: 5 (ref 5–15)
BUN: 19 mg/dL (ref 6–20)
CALCIUM: 7.7 mg/dL — AB (ref 8.9–10.3)
CO2: 21 mmol/L — ABNORMAL LOW (ref 22–32)
CREATININE: 1.21 mg/dL — AB (ref 0.44–1.00)
Chloride: 115 mmol/L — ABNORMAL HIGH (ref 101–111)
GFR calc non Af Amer: 45 mL/min — ABNORMAL LOW (ref >60)
GFR, EST AFRICAN AMERICAN: 52 mL/min — AB (ref >60)
Glucose, Bld: 63 mg/dL — ABNORMAL LOW (ref 65–99)
Potassium: 3.7 mmol/L (ref 3.5–5.1)
SODIUM: 141 mmol/L (ref 135–145)

## 2016-06-26 LAB — GLUCOSE, CAPILLARY
GLUCOSE-CAPILLARY: 65 mg/dL (ref 65–99)
GLUCOSE-CAPILLARY: 133 mg/dL — AB (ref 65–99)
Glucose-Capillary: 78 mg/dL (ref 65–99)
Glucose-Capillary: 145 mg/dL — ABNORMAL HIGH (ref 65–99)
Glucose-Capillary: 107 mg/dL — ABNORMAL HIGH (ref 65–99)

## 2016-06-26 LAB — CBC
HCT: 21.2 % — ABNORMAL LOW (ref 36.0–46.0)
HEMOGLOBIN: 7.1 g/dL — AB (ref 12.0–15.0)
MCH: 30.7 pg (ref 26.0–34.0)
MCHC: 33.5 g/dL (ref 30.0–36.0)
MCV: 91.8 fL (ref 78.0–100.0)
Platelets: 200 10*3/uL (ref 150–400)
RBC: 2.31 MIL/uL — ABNORMAL LOW (ref 3.87–5.11)
RDW: 13.3 % (ref 11.5–15.5)
WBC: 6.6 10*3/uL (ref 4.0–10.5)

## 2016-06-26 MED ORDER — CARVEDILOL 12.5 MG PO TABS
12.5000 mg | ORAL_TABLET | Freq: Two times a day (BID) | ORAL | Status: DC
  Administered 2016-06-26 – 2016-06-29 (×6): 12.5 mg via ORAL
  Filled 2016-06-26 (×6): qty 1

## 2016-06-26 MED ORDER — INSULIN GLARGINE 100 UNIT/ML ~~LOC~~ SOLN: 12.0000 [IU] | Freq: Every day | SUBCUTANEOUS | Status: DC

## 2016-06-26 MED ORDER — INSULIN GLARGINE 100 UNIT/ML ~~LOC~~ SOLN
10.0000 [IU] | Freq: Every day | SUBCUTANEOUS | Status: DC
  Administered 2016-06-26 – 2016-06-29 (×4): 10 [IU] via SUBCUTANEOUS
  Filled 2016-06-26 (×4): qty 0.1

## 2016-06-26 MED ORDER — INSULIN GLARGINE 100 UNIT/ML ~~LOC~~ SOLN
10.0000 [IU] | Freq: Every day | SUBCUTANEOUS | Status: DC
  Filled 2016-06-26: qty 0.1

## 2016-06-26 MED ORDER — OXYCODONE HCL 5 MG PO TABS
5.0000 mg | ORAL_TABLET | Freq: Once | ORAL | Status: AC
  Administered 2016-06-26: 5 mg via ORAL
  Filled 2016-06-26: qty 1

## 2016-06-26 MED ORDER — GLUCERNA SHAKE PO LIQD
237.0000 mL | Freq: Two times a day (BID) | ORAL | Status: DC
  Administered 2016-06-26 – 2016-06-27 (×2): 237 mL via ORAL
  Filled 2016-06-26 (×4): qty 237

## 2016-06-26 NOTE — Progress Notes (Addendum)
Inpatient Diabetes Program Recommendations  AACE/ADA: New Consensus Statement on Inpatient Glycemic Control (2015)  Target Ranges:  Prepandial:   less than 140 mg/dL      Peak postprandial:   less than 180 mg/dL (1-2 hours)      Critically ill patients:  140 - 180 mg/dL   Lab Results  Component Value Date   GLUCAP 90 06/25/2016   HGBA1C 7.3* 05/17/2016    Review of Glycemic Control Results for Cassandra [name], Cassandra [name] (MRN [id number]) as of 06/26/2016 07:51  Ref. Range 06/26/2016 05:48  Glucose Latest Ref Range: 65-99 mg/dL 63 (L)    Diabetes history: Type 1 diabetes Inpatient Diabetes Program Recommendations:   Please decrease Lantus 10 units daily.  Thanks, Beryl MeagerJenny Nashley Cordoba, RN, BC-ADM Inpatient Diabetes Coordinator Pager [phone] (8a-5p)

## 2016-06-26 NOTE — Progress Notes (Addendum)
CBG 65 this am. Will administer Orange Juice and have NT to re-check 15 minutes later. Pt is alert. Will monitor.  0900 CBG now 78. Made Dr Gwenlyn PerkingMadera aware. Will monitor pt.

## 2016-06-26 NOTE — Progress Notes (Signed)
  Spoke with patient again regarding diabetes and insulin.  Explained to patient that her low fasting blood sugar this morning was likely due to Lantus and therefore her dose was decreased from 15 units to 10 units daily.  Explained that she needs basal insulin every day to prevent DKA.  Also discussed that Humalog is a rapid acting insulin to cover meals and elevated blood sugars.   Upon discharge, may consider very sensitive correction scale starting at 151 mg/dL-patient uses Humalog insulin at home. 151-201 mg/dL- 1 unit [id number] mg/dL-2 units 229-798251-300 mg/dL- 3 units  [phone] mg/dL-4 units 174-081351-400 mg/dL- 5 units >448>401 mg/dL- 6 units  Patient will also likely need Humalog meal coverage to cover CHO intake.  May consider also adding Humalog 2 units tid with meals (hold if patient does not eat).  Patient is interested in following up with [place]ocrinologist in [place] ([place] from [place]  MD, can you please assist patient in getting appointment with endocrinologist after discharge.  Patient would also benefit from outpatient diabetes education with CDE (one on one with CDE).  Thanks, Beryl MeagerJenny Jennalyn Cawley, RN, BC-ADM Inpatient Diabetes Coordinator Pager [phone]

## 2016-06-26 NOTE — Progress Notes (Signed)
[facility] PROGRESS NOTE  Cassandra [name] XBJ:[id number] DOB: November 24, 1946 DOA: 06/22/2016 PCP: Irving Copas, MD  Interim summary and HPI 70 yo with n/v, abd pain, cough and altered mental status from DKA. Patient with SIRS on presentation and initially admitted to PCCM with concerns for sepsis. No source of infection appreciated and symptoms improved/resolved after correcting DKA. Her antibiotics were discontinued and patient transfer to IM for continue adjustment on hypoglycemic regimen and further evaluation/treatment.  Assessment/Plan: 1-DKA: with SIRS on admission -anion gap close -CBCG's < 200 for > 4 episodes; in fact experience episode of hypoglycemia -currently on SSI and lantus -not eating much -advise to avoid skipping meals and to use glucerna BID -most recent A1C 7.3 -diabetes coordinator on board  2-hypothyroidism -continue synthroid  3-acute encephalopathy: metabolic and due to DKa most likely -resolved -mentation back to baseline -will monitor  4-hx of CVA: -no new deficit appreciated -will continue plavix and ASA for secondary prevention  5-HLD: -will continue statins  6-HTN: -will continue Norvasc and Coreg  7-Depression: -will continue lexapro -no SI or hallucinations appreciated  8-hx of tobacco abuse and alcohol abuse -cessation counseling provided -will continue thiamine/folic acid -patient declined nicoderm  9-AOCD -no overt bleeding appreciated -will follow Hgb trend and transfuse PRBC's if Hgb < 7.0  10-GERD: -will continue PPI  11-acute on chronic renal failure: stage 3 at baseline -improving after IVF's given -will monitor trend and minimize use of nephrotoxic agents.  12-physical deconditioning -close to baseline     Code Status: Full Family Communication: no family at bedside Disposition Plan: home with [facility] services at discharge. Controlling CBG's better prior to discharge.     Consultants:  PCCM  Procedures:  See below for x-ray reports   Antibiotics:  Vanc and Zosyn 7/8>>>7/11  HPI/Subjective: Afebrile, denies CP and SOB. Slight hypoglycemic event early today. No nausea, no vomiting. Reports poor appetite.   Objective: Filed Vitals:   06/26/16 1925 06/26/16 2255  BP: 157/59 133/51  Pulse: 66 67  Temp:  98.3 F (36.8 C)  Resp:  18    Intake/Output Summary (Last 24 hours) at 06/26/16 2301 Last data filed at 06/26/16 1900  Gross per 24 hour  Intake    480 ml  Output    300 ml  Net    180 ml   Filed Weights   06/25/16 0300 06/25/16 1351 06/26/16 0544  Weight: 53.9 kg (118 lb 13.3 oz) 54.931 kg (121 lb 1.6 oz) 54.5 kg (120 lb 2.4 oz)    Exam:   General:  Afebrile, denies CP and SOB. Patient is AAOX3. Denies nausea, vomiting, abd pain and dysuria. Slight hypoglycemia. Appetite is poor.  Cardiovascular: S1 and S2, no rubs or gallops  Respiratory: good air movement, no wheezing, no crackles. Good O2 sat on RA  Abdomen: soft, NT, ND, positive BS  Musculoskeletal: no edema or cyanosis  Data Reviewed: Basic Metabolic Panel:  Recent Labs Lab 06/22/16 2030  06/23/16 0435 06/23/16 3086  06/24/16 0403  06/25/16 0308 06/25/16 0538 06/25/16 1124 06/25/16 1558 06/26/16 0548  NA  --   < > 138 138  < > 132*  < > 135 136 135 138 141  K  --   < > 3.0* 3.9  < > 4.3  < > 3.7 3.8 3.5 3.7 3.7  CL  --   < > 110 109  < > 104  < > 112* 109 109 111 115*  CO2  --   < > 19* 17*  < >  10*  < > 16* 19* 14* 17* 21*  GLUCOSE  --   < > 108* 109*  < > 531*  < > 138* 107* 232* 126* 63*  BUN  --   < > 45* 44*  < > 38*  < > 33* 32* 27* 24* 19  CREATININE  --   < > 2.67* 2.59*  < > 2.55*  < > 2.13* 2.11* 1.96* 1.71* 1.21*  CALCIUM  --   < > 7.1* 7.3*  < > 7.2*  < > 7.1* 7.7* 7.4* 7.6* 7.7*  MG 2.3  --  1.8 1.8  --  1.8  --  1.6*  --   --   --   --   PHOS 5.8*  --  3.5 3.6  --  3.6  --  2.0*  --   --   --   --   < > = values in this interval not  displayed. Liver Function Tests:  Recent Labs Lab 06/22/16 1526  AST 16  ALT 14  ALKPHOS 67  BILITOT 1.4*  PROT 6.1*  ALBUMIN 3.5    Recent Labs Lab 06/22/16 1526  LIPASE 138*   CBC:  Recent Labs Lab 06/22/16 1526 06/23/16 0435 06/24/16 0403 06/25/16 0308 06/25/16 0538 06/26/16 0548  WBC 15.2* 17.1* 14.3* 10.4  --  6.6  NEUTROABS  --  12.8*  --   --   --   --   HGB 9.2* 7.6* 8.3* 6.6* 7.5* 7.1*  HCT 31.1* 22.2* 24.0* 19.6* 22.0* 21.2*  MCV 105.1* 91.7 93.0 91.2  --  91.8  PLT 318 247 232 212  --  200   CBG:  Recent Labs Lab 06/26/16 0808 06/26/16 0846 06/26/16 1221 06/26/16 1655 06/26/16 2251  GLUCAP 65 78 145* 107* 133*    Recent Results (from the past 240 hour(s))  Culture, blood (routine x 2)     Status: None (Preliminary result)   Collection Time: 06/22/16  3:30 PM  Result Value Ref Range Status   Specimen Description RIGHT ANTECUBITAL  Final   Special Requests BOTTLES DRAWN AEROBIC AND ANAEROBIC 10CC  Final   Culture NO GROWTH 4 DAYS  Final   Report Status PENDING  Incomplete  Culture, blood (routine x 2)     Status: None (Preliminary result)   Collection Time: 06/22/16  3:42 PM  Result Value Ref Range Status   Specimen Description BLOOD RIGHT HAND  Final   Special Requests IN PEDIATRIC BOTTLE 3CC  Final   Culture NO GROWTH 4 DAYS  Final   Report Status PENDING  Incomplete  Urine culture     Status: None   Collection Time: 06/22/16  7:07 PM  Result Value Ref Range Status   Specimen Description URINE, CLEAN CATCH  Final   Special Requests Normal  Final   Culture NO GROWTH  Final   Report Status 06/23/2016 FINAL  Final  MRSA PCR Screening     Status: None   Collection Time: 06/22/16  8:15 PM  Result Value Ref Range Status   MRSA by PCR NEGATIVE NEGATIVE Final    Comment:        The GeneXpert MRSA Assay (FDA approved for NASAL specimens only), is one component of a comprehensive MRSA colonization surveillance program. It is  not intended to diagnose MRSA infection nor to guide or monitor treatment for MRSA infections.      Studies: No results found.  Scheduled Meds: . acidophilus  2 capsule Oral Daily  .  amLODipine  5 mg Oral Daily  . aspirin EC  81 mg Oral Daily  . atorvastatin  10 mg Oral QHS  . carvedilol  12.5 mg Oral BID WC  . clopidogrel  75 mg Oral Daily  . escitalopram  10 mg Oral QHS  . feeding supplement (GLUCERNA SHAKE)  237 mL Oral BID BM  . folic acid  1 mg Oral Daily  . heparin  5,000 Units Subcutaneous Q8H  . hydrocortisone   Rectal BID  . insulin aspart  0-9 Units Subcutaneous TID WC  . insulin glargine  10 Units Subcutaneous Daily  . levothyroxine  75 mcg Oral QAC breakfast  . multivitamin animal shapes (with Ca/FA)  1 tablet Oral Daily  . oxyCODONE  5 mg Oral Once  . pantoprazole  40 mg Oral Daily  . thiamine  100 mg Oral Daily   Continuous Infusions:   Active Problems:   DKA (diabetic ketoacidoses) (HCC)    Time spent: 30 minutes    Vassie Loll  [facility] Pager [phone]. If 7PM-7AM, please contact night-coverage at [email or web address], password [facility] 06/26/2016, 11:01 PM  LOS: 4 days

## 2016-06-26 NOTE — Care Management Note (Addendum)
Case Management Note  Patient Details  Name: Cassandra [name] MRN: [id number] Date of Birth: 21-Mar-1946  Subjective/Objective:                 Patient established with [facility]. Resumption order placed, referral made to resume at DC. No other CM needs identified.    Action/Plan:  CM will continue to follow.  Addendum- Sherron MondaySpoke with bedside nurse. Sats on two separate oximeters do not match, bedside one reads low initially and then reads normal, while other reads normal the whole time. Orthostatics normal. Patient c/o dizzyness. CM asked her to describe and asked her what she thought may be causing it, she stated it was from her headache. Reported to MD and bedside RN, and requested Tylenol for headache. MD stated he will place DC order. HH has been set up. CM and bedside RN called home number with no answer. Pt's husband just left hospital, likely he is not home yet. Bedside RN understands to keep trying to contact him to return for patient, pt has no intensity of services and DC order to be placed.  Expected Discharge Date:                  Expected Discharge Plan:  Home w Home Health Services  In-House Referral:     Discharge planning Services  CM Consult  Post Acute Care Choice:  Home Health Choice offered to:  Patient, Spouse  DME Arranged:  N/A DME Agency:     HH Arranged:  RN, PT, OT HH Agency:  [facility]  Status of Service:  Completed, signed off  If discussed at Long Length of Stay Meetings, dates discussed:    Additional Comments:  Lawerance SabalDebbie Shavone Nevers, RN 06/26/2016, 2:02 PM

## 2016-06-27 DIAGNOSIS — F1010 Alcohol abuse, uncomplicated: Secondary | ICD-10-CM

## 2016-06-27 DIAGNOSIS — F17200 Nicotine dependence, unspecified, uncomplicated: Secondary | ICD-10-CM

## 2016-06-27 DIAGNOSIS — I639 Cerebral infarction, unspecified: Secondary | ICD-10-CM

## 2016-06-27 DIAGNOSIS — R5381 Other malaise: Secondary | ICD-10-CM

## 2016-06-27 DIAGNOSIS — D649 Anemia, unspecified: Secondary | ICD-10-CM

## 2016-06-27 LAB — CBC
HEMATOCRIT: 21.2 % — AB (ref 36.0–46.0)
HEMOGLOBIN: 7.2 g/dL — AB (ref 12.0–15.0)
MCH: 31.6 pg (ref 26.0–34.0)
MCHC: 34 g/dL (ref 30.0–36.0)
MCV: 93 fL (ref 78.0–100.0)
Platelets: 173 10*3/uL (ref 150–400)
RBC: 2.28 MIL/uL — AB (ref 3.87–5.11)
RDW: 13.3 % (ref 11.5–15.5)
WBC: 5.5 10*3/uL (ref 4.0–10.5)

## 2016-06-27 LAB — CULTURE, BLOOD (ROUTINE X 2)
CULTURE: NO GROWTH
Culture: NO GROWTH

## 2016-06-27 LAB — BASIC METABOLIC PANEL
ANION GAP: 3 — AB (ref 5–15)
BUN: 13 mg/dL (ref 6–20)
CHLORIDE: 114 mmol/L — AB (ref 101–111)
CO2: 21 mmol/L — ABNORMAL LOW (ref 22–32)
Calcium: 7.8 mg/dL — ABNORMAL LOW (ref 8.9–10.3)
Creatinine, Ser: 0.96 mg/dL (ref 0.44–1.00)
GFR calc non Af Amer: 59 mL/min — ABNORMAL LOW (ref >60)
Glucose, Bld: 83 mg/dL (ref 65–99)
POTASSIUM: 4.3 mmol/L (ref 3.5–5.1)
SODIUM: 138 mmol/L (ref 135–145)

## 2016-06-27 LAB — GLUCOSE, CAPILLARY
GLUCOSE-CAPILLARY: 82 mg/dL (ref 65–99)
GLUCOSE-CAPILLARY: 75 mg/dL (ref 65–99)
GLUCOSE-CAPILLARY: 103 mg/dL — AB (ref 65–99)
GLUCOSE-CAPILLARY: 97 mg/dL (ref 65–99)

## 2016-06-27 MED ORDER — POLYSACCHARIDE IRON COMPLEX 150 MG PO CAPS
150.0000 mg | ORAL_CAPSULE | Freq: Two times a day (BID) | ORAL | Status: DC
  Administered 2016-06-27 – 2016-06-29 (×4): 150 mg via ORAL
  Filled 2016-06-27 (×4): qty 1

## 2016-06-27 NOTE — Progress Notes (Signed)
[facility] PROGRESS NOTE  Merril AbbeBillie A Kocurek ZOX:[id number] DOB: 08/10/1946 DOA: 06/22/2016 PCP: Irving CopasHACKER,ROBERT KELLER, MD  Interim summary and HPI 70 yo with n/v, abd pain, cough and altered mental status from DKA. Patient with SIRS on presentation and initially admitted to PCCM with concerns for sepsis. No source of infection appreciated and symptoms improved/resolved after correcting DKA. Her antibiotics were discontinued and patient transfer to IM for continue adjustment on hypoglycemic regimen and further evaluation/treatment.  Assessment/Plan: 1-DKA: with SIRS on admission -anion gap close -CBCG's < 200 for > 4 episodes; in fact experience episode of hypoglycemia -currently on SSI and lantus -advise to avoid skipping meals and to use glucerna BID -most recent A1C 7.3 -diabetes coordinator on board; appreciate recommendations. -patient will need outpatient follow up with endocrinology service.  2-hypothyroidism -continue synthroid -will need follow up of thyroid function in 3-4 weeks -TSH 15.603 and free T4 0.60  3-acute encephalopathy: metabolic and due to DKa most likely -resolved -mentation back to baseline -will monitor  4-hx of CVA: -no new deficit appreciated -will continue plavix and ASA for secondary prevention (low threshold to stop therapy due to anemia if Hgb drop further)  5-HLD: -will continue statins  6-HTN: -will continue Norvasc and Coreg  7-Depression: -will continue lexapro -no SI or hallucinations appreciated  8-hx of tobacco abuse and alcohol abuse -cessation counseling provided -will continue thiamine/folic acid -patient declined nicoderm patch  9-AOCD -no overt bleeding appreciated -will follow Hgb trend and transfuse PRBC's if Hgb < 7.0 -started on niferex -Hgb 7.2 currently   10-GERD: -will continue PPI  11-acute on chronic renal failure: stage 3 at baseline -improved after IVF's given -Cr level back to baseline  now  12-physical deconditioning -close to baseline -[facility]  Code Status: Full Family Communication: no family at bedside [facility]. Controlling CBG's better prior to discharge; most likely home in am (as she is feeling bad and reported that her husband was sick today as well to assist with her care).    Consultants:  PCCM  Procedures:  See below for x-ray reports   Antibiotics:  Vanc and Zosyn 7/8>>>7/11  HPI/Subjective: Afebrile, denies CP and SOB. No further hypoglycemic events appreciated. No nausea, no vomiting. Reports appetite is somewhat better. Today feeling fatigue and with general malaise.   Objective: Filed Vitals:   06/26/16 2255 06/27/16 0715  BP: 133/51 142/60  Pulse: 67 67  Temp: 98.3 F (36.8 C) 98.7 F (37.1 C)  Resp: 18 20    Intake/Output Summary (Last 24 hours) at 06/27/16 1845 Last data filed at 06/27/16 0955  Gross per 24 hour  Intake    780 ml  Output      0 ml  Net    780 ml   Filed Weights   06/25/16 1351 06/26/16 0544 06/27/16 0800  Weight: 54.931 kg (121 lb 1.6 oz) 54.5 kg (120 lb 2.4 oz) 52.753 kg (116 lb 4.8 oz)    Exam:   General:  Afebrile, denies CP and SOB. Patient is AAOX3. Denies nausea, vomiting, abd pain and dysuria. No further  Hypoglycemia appreciated. Feeling weak and just complaining of general malaise. Appetite is slightly better today.  Cardiovascular: S1 and S2, no rubs or gallops  Respiratory: good air movement, no wheezing, no crackles. Good O2 sat on RA  Abdomen: soft, NT, ND, positive BS  Musculoskeletal: no edema or cyanosis  Data Reviewed: Basic Metabolic Panel:  Recent Labs Lab 06/22/16 2030  06/23/16 0435 06/23/16 1610  06/24/16 0403  06/25/16 0308 06/25/16 0538 06/25/16 1124 06/25/16 1558 06/26/16 0548 06/27/16 0617  NA  --   < > 138 138  < > 132*  < > 135 136 135 138 141 138  K  --   < > 3.0* 3.9  < > 4.3  < > 3.7 3.8  3.5 3.7 3.7 4.3  CL  --   < > 110 109  < > 104  < > 112* 109 109 111 115* 114*  CO2  --   < > 19* 17*  < > 10*  < > 16* 19* 14* 17* 21* 21*  GLUCOSE  --   < > 108* 109*  < > 531*  < > 138* 107* 232* 126* 63* 83  BUN  --   < > 45* 44*  < > 38*  < > 33* 32* 27* 24* 19 13  CREATININE  --   < > 2.67* 2.59*  < > 2.55*  < > 2.13* 2.11* 1.96* 1.71* 1.21* 0.96  CALCIUM  --   < > 7.1* 7.3*  < > 7.2*  < > 7.1* 7.7* 7.4* 7.6* 7.7* 7.8*  MG 2.3  --  1.8 1.8  --  1.8  --  1.6*  --   --   --   --   --   PHOS 5.8*  --  3.5 3.6  --  3.6  --  2.0*  --   --   --   --   --   < > = values in this interval not displayed. Liver Function Tests:  Recent Labs Lab 06/22/16 1526  AST 16  ALT 14  ALKPHOS 67  BILITOT 1.4*  PROT 6.1*  ALBUMIN 3.5    Recent Labs Lab 06/22/16 1526  LIPASE 138*   CBC:  Recent Labs Lab 06/23/16 0435 06/24/16 0403 06/25/16 0308 06/25/16 0538 06/26/16 0548 06/27/16 0617  WBC 17.1* 14.3* 10.4  --  6.6 5.5  NEUTROABS 12.8*  --   --   --   --   --   HGB 7.6* 8.3* 6.6* 7.5* 7.1* 7.2*  HCT 22.2* 24.0* 19.6* 22.0* 21.2* 21.2*  MCV 91.7 93.0 91.2  --  91.8 93.0  PLT 247 232 212  --  200 173   CBG:  Recent Labs Lab 06/26/16 1655 06/26/16 2251 06/27/16 0801 06/27/16 1219 06/27/16 1718  GLUCAP 107* 133* 82 75 103*    Recent Results (from the past 240 hour(s))  Culture, blood (routine x 2)     Status: None   Collection Time: 06/22/16  3:30 PM  Result Value Ref Range Status   Specimen Description RIGHT ANTECUBITAL  Final   Special Requests BOTTLES DRAWN AEROBIC AND ANAEROBIC 10CC  Final   Culture NO GROWTH 5 DAYS  Final   Report Status 06/27/2016 FINAL  Final  Culture, blood (routine x 2)     Status: None   Collection Time: 06/22/16  3:42 PM  Result Value Ref Range Status   Specimen Description BLOOD RIGHT HAND  Final   Special Requests IN PEDIATRIC BOTTLE 3CC  Final   Culture NO GROWTH 5 DAYS  Final   Report Status 06/27/2016 FINAL  Final  Urine culture      Status: None   Collection Time: 06/22/16  7:07 PM  Result Value Ref Range Status   Specimen Description URINE, CLEAN CATCH  Final   Special Requests Normal  Final   Culture  NO GROWTH  Final   Report Status 06/23/2016 FINAL  Final  MRSA PCR Screening     Status: None   Collection Time: 06/22/16  8:15 PM  Result Value Ref Range Status   MRSA by PCR NEGATIVE NEGATIVE Final    Comment:        The GeneXpert MRSA Assay (FDA approved for NASAL specimens only), is one component of a comprehensive MRSA colonization surveillance program. It is not intended to diagnose MRSA infection nor to guide or monitor treatment for MRSA infections.      Studies: No results found.  Scheduled Meds: . acidophilus  2 capsule Oral Daily  . amLODipine  5 mg Oral Daily  . aspirin EC  81 mg Oral Daily  . atorvastatin  10 mg Oral QHS  . carvedilol  12.5 mg Oral BID WC  . clopidogrel  75 mg Oral Daily  . escitalopram  10 mg Oral QHS  . feeding supplement (GLUCERNA SHAKE)  237 mL Oral BID BM  . folic acid  1 mg Oral Daily  . heparin  5,000 Units Subcutaneous Q8H  . hydrocortisone   Rectal BID  . insulin aspart  0-9 Units Subcutaneous TID WC  . insulin glargine  10 Units Subcutaneous Daily  . iron polysaccharides  150 mg Oral BID  . levothyroxine  75 mcg Oral QAC breakfast  . multivitamin animal shapes (with Ca/FA)  1 tablet Oral Daily  . pantoprazole  40 mg Oral Daily  . thiamine  100 mg Oral Daily   Continuous Infusions:   Active Problems:   DKA (diabetic ketoacidoses) (HCC)   Time spent: 30 minutes   Vassie Loll  [facility] Pager [phone]. If 7PM-7AM, please contact night-coverage at [email or web address], password [facility] 06/27/2016, 6:45 PM  LOS: 5 days

## 2016-06-27 NOTE — Care Management Important Message (Signed)
Important Message  Patient Details  Name: Cassandra [name] MRN: [id number] Date of Birth: 09-20-1946   Medicare Important Message Given:  Yes    [name], Braylee Lal Abena 06/27/2016, 1:41 PM

## 2016-06-27 NOTE — Evaluation (Addendum)
Physical Therapy Evaluation Patient Details Name: Cassandra [name] MRN: [id number] DOB: [birth year] Today's Date: 06/27/2016   History of Present Illness  Patient is a 70 y/o female with hx of type 1 DM, hypothyroidism, HTN, CKD stage 3, tobacco abuse, ETOH abuse, CVA presents with nausea/vomiting and DKA. At end of June, pt was discharged from a skilled nursing facility after hospitalization with septic shock, diabetic ketoacidosis, acute encephalopathy, acute on chronic renal failure, and acute respiratory failure requiring endotracheal intubation.   Clinical Impression  Patient presents with generalized weakness, dizziness and balance deficits s/p above impacting mobility. Tolerated gait training within room but mobility and distance limited due to dizziness. Reports this happens when her sugars fluctuate. Discussed safety at home and need for use of RW for support. Pt high fall risk based on TUG score of 21. Pt has support from spouse at home. Recommend continued HHPT to maximize independence and mobility. Will follow acutely.    Follow Up Recommendations Home health PT;Supervision for mobility/OOB    Equipment Recommendations  None recommended by PT    Recommendations for Other Services OT consult     Precautions / Restrictions Precautions Precautions: Fall Restrictions Weight Bearing Restrictions: No      Mobility  Bed Mobility Overal bed mobility: Needs Assistance Bed Mobility: Supine to Sit     Supine to sit: Min assist;HOB elevated     General bed mobility comments: Pt reaching for therapist's hand to get to EOB. Able to get to EOB x2 without assist using rail for support. + dizziness.  Transfers Overall transfer level: Needs assistance Equipment used: None Transfers: Sit to/from Stand Sit to Stand: Min guard         General transfer comment: Min guard for safety. Stood from EOB x4. Transferred to chair post ambulation bout.  Ambulation/Gait Ambulation/Gait  assistance: Min guard Ambulation Distance (Feet): 20 Feet (x3 bouts, + 15') Assistive device: None Gait Pattern/deviations: Step-through pattern;Decreased stride length;Shuffle   Gait velocity interpretation: Below normal speed for age/gender General Gait Details: Slow, unsteady gait with dizziness present during mobility.   Stairs            Wheelchair Mobility    Modified Rankin (Stroke Patients Only)       Balance Overall balance assessment: Needs assistance Sitting-balance support: Feet supported;No upper extremity supported Sitting balance-Leahy Scale: Good Sitting balance - Comments: Able to reach outside BoS and donn socks sitting EOB and reach to floor to move slippers without LOB.   Standing balance support: During functional activity Standing balance-Leahy Scale: Fair Standing balance comment: Able to stand unsupported without assist. + dizziness limiting mobility.                  Standardized Balance Assessment Standardized Balance Assessment : TUG: Timed Up and Go Test     Timed Up and Go Test TUG: Normal TUG Normal TUG (seconds): 21.1     Pertinent Vitals/Pain Pain Assessment: No/denies pain    Home Living Family/patient expects to be discharged to:: Private residence Living Arrangements: Spouse/significant other Available Help at Discharge: Family;Available 24 hours/day Type of Home: Apartment Home Access: Level entry     Home Layout: One level Home Equipment: Walker - 2 wheels Additional Comments: Husband does all cooking, cleaning and laundry as well as grocery shopping.     Prior Function Level of Independence: Independent with assistive device(s)         Comments: Pt working with HHPT and just graduated to not using DME.  Dresses/bathes self. Reports multiple falls.     Hand Dominance   Dominant Hand: Right    Extremity/Trunk Assessment   Upper Extremity Assessment: Defer to OT evaluation           Lower Extremity  Assessment: Generalized weakness         Communication   Communication: No difficulties  Cognition Arousal/Alertness: Awake/alert Behavior During Therapy: WFL for tasks assessed/performed Overall Cognitive Status: Within Functional Limits for tasks assessed                      General Comments      Exercises        Assessment/Plan    PT Assessment Patient needs continued PT services  PT Diagnosis Difficulty walking;Generalized weakness   PT Problem List Decreased strength;Cardiopulmonary status limiting activity;Decreased activity tolerance;Decreased balance;Decreased mobility  PT Treatment Interventions Balance training;Gait training;Functional mobility training;Therapeutic activities;Therapeutic exercise;Patient/family education   PT Goals (Current goals can be found in the Care Plan section) Acute Rehab PT Goals Patient Stated Goal: to get my sugars under control PT Goal Formulation: With patient Time For Goal Achievement: 07/11/16 Potential to Achieve Goals: Good    Frequency Min 3X/week   Barriers to discharge        Co-evaluation               End of Session Equipment Utilized During Treatment: Gait belt Activity Tolerance: Patient tolerated treatment well;Treatment limited secondary to medical complications (Comment) (dizziness) Patient left: in chair;with call bell/phone within reach;with chair alarm set Nurse Communication: Mobility status         Time: 1420-1441 PT Time Calculation (min) (ACUTE ONLY): 21 min   Charges:   PT Evaluation $PT Eval Moderate Complexity: 1 Procedure     PT G Codes:        Armonie Mettler A Breniyah Romm 06/27/2016, 3:00 PM Mylo RedShauna Toben Acuna, PT, DPT [phone]

## 2016-06-28 DIAGNOSIS — I10 Essential (primary) hypertension: Secondary | ICD-10-CM | POA: Insufficient documentation

## 2016-06-28 DIAGNOSIS — N179 Acute kidney failure, unspecified: Secondary | ICD-10-CM | POA: Insufficient documentation

## 2016-06-28 DIAGNOSIS — Z8673 Personal history of transient ischemic attack (TIA), and cerebral infarction without residual deficits: Secondary | ICD-10-CM | POA: Insufficient documentation

## 2016-06-28 DIAGNOSIS — N189 Chronic kidney disease, unspecified: Secondary | ICD-10-CM

## 2016-06-28 DIAGNOSIS — E785 Hyperlipidemia, unspecified: Secondary | ICD-10-CM | POA: Insufficient documentation

## 2016-06-28 DIAGNOSIS — D638 Anemia in other chronic diseases classified elsewhere: Secondary | ICD-10-CM

## 2016-06-28 DIAGNOSIS — R5381 Other malaise: Secondary | ICD-10-CM | POA: Insufficient documentation

## 2016-06-28 LAB — GLUCOSE, CAPILLARY
GLUCOSE-CAPILLARY: 42 mg/dL — AB (ref 65–99)
GLUCOSE-CAPILLARY: 90 mg/dL (ref 65–99)
GLUCOSE-CAPILLARY: 171 mg/dL — AB (ref 65–99)
GLUCOSE-CAPILLARY: 149 mg/dL — AB (ref 65–99)
GLUCOSE-CAPILLARY: 50 mg/dL — AB (ref 65–99)
GLUCOSE-CAPILLARY: 73 mg/dL (ref 65–99)
GLUCOSE-CAPILLARY: 116 mg/dL — AB (ref 65–99)

## 2016-06-28 MED ORDER — INSULIN GLARGINE 100 UNIT/ML ~~LOC~~ SOLN: 10.0000 [IU] | Freq: Every day | SUBCUTANEOUS | Status: DC

## 2016-06-28 MED ORDER — INSULIN LISPRO 100 UNIT/ML ~~LOC~~ SOLN: 4.0000 [IU] | Freq: Three times a day (TID) | SUBCUTANEOUS | Status: DC

## 2016-06-28 MED ORDER — POLYSACCHARIDE IRON COMPLEX 150 MG PO CAPS: 150.0000 mg | ORAL_CAPSULE | Freq: Two times a day (BID) | ORAL | Status: DC

## 2016-06-28 MED ORDER — GLUCERNA SHAKE PO LIQD
237.0000 mL | Freq: Three times a day (TID) | ORAL | Status: DC
  Administered 2016-06-29: 237 mL via ORAL

## 2016-06-28 NOTE — Discharge Summary (Signed)
Physician Discharge Summary  Cassandra [name] ZOX:[id number] DOB: 1946/08/31 DOA: 06/22/2016  PCP: Irving CopasHACKER,ROBERT KELLER, MD  Admit date: 06/22/2016 Discharge date: 06/28/2016  Time spent: 35 minutes  Recommendations for Outpatient Follow-up:  Repeat basic metabolic panel to follow electrolytes and renal function Repeat CBC to follow hemoglobin trend Reassess patient's blood pressure and adjust medication as needed Please follow closely patient CBGs and diabetes condition with further adjustment on her hypoglycemic regimen as require  Repeat thyroid panel in 3-4 weeks and adjust Synthroid regimen as needed.  Discharge Diagnoses:  Active Problems:   DKA (diabetic ketoacidoses) (HCC) Hypertension Hyperlipidemia GERD Acute on chronic renal failure (stage III at baseline) History of CVA Tobacco and alcohol abuse Chronic dizziness (intermittently) Anemia of chronic disease with iron deficiency component  Discharge Condition: Stable at the moment of discharge and with complete resolution of her DKA. No further signs of encephalopathy. Patient will be discharged home with home health services for physical therapy and has been instructed to follow-up with her PCP in 10 days. She will also need to establish care with the endocrinologist in town (per records has no show appointments at Dr. Remus BlakeKumar's office).  Diet recommendation: Heart healthy and modified carbohydrates diet recommended  Filed Weights   06/26/16 0544 06/27/16 0800 06/28/16 0500  Weight: 54.5 kg (120 lb 2.4 oz) 52.753 kg (116 lb 4.8 oz) 50.894 kg (112 lb 3.2 oz)    History of present illness:  As per H&P dictated by Dr. Lawanda CousinsJennings Nestor on 06/22/2016 Patient presented to hospital with 1 day of persistent nausea and vomiting with abdominal pain and cough. Patient remains altered and history obtained from patient's daughter. Patient herself reporting she has a headache and is thirsty. Per daughter patient began to have a poor  appetite and possibly 3 days ago feeling ill. Reportedly she did not take her Lantus last evening and began to have nausea and vomiting that same night. Reportedly has had also altered mentation and this time. No recent sick contacts. No recent travel or undercooked foods. Approximately 2 weeks ago was discharged from a skilled nursing facility after hospitalization with septic shock, diabetic ketoacidosis, acute encephalopathy, acute on chronic renal failure, and acute respiratory failure requiring endotracheal intubation.  Hospital Course:  1-DKA: with SIRS Features on admission -anion gap close -CBCG's < 200 for > 7 episodes; in fact experienced couple episode of hypoglycemia during this hospitalization secondary to poor by mouth intake. -Patient has been discharged on Lantus 10 units daily and 4 units of Humalog before meal coverage 3 times a day.  -advise to avoid skipping meals and to use glucerna BID -most recent A1C 7.3 -diabetes coordinator will follow-up with patient at discharge for further assistance and education -patient will need outpatient follow up with endocrinology service as well. -Further adjustment to her hypoglycemic regimen to be done based on fluctuations of her CBGs.  2-hypothyroidism -continue synthroid At discharge -will need follow up of thyroid function in 3-4 weeks -TSH 15.603 and free T4 0.60  3-acute encephalopathy: metabolic In nature and due to DKA most likely -resolved -mentation back to baseline at discharge  4-hx of CVA: -no new deficit appreciated During this hospitalization -will continue plavix and ASA for secondary prevention  5-HLD: -will continue statins  6-HTN: -will continue Norvasc, Lasix and Coreg -Patient advised to follow heart healthy diet  7-Depression: -will continue lexapro -no SI or hallucinations appreciated  8-hx of tobacco abuse and alcohol abuse -cessation counseling provided -will continue thiamine/folic acid -patient  declined  Use of nicoderm patch  9-AOCD -no overt bleeding appreciated -No transfusion needed -Discharged on niferex twice a day; to assist with component of iron deficiency -Hgb 7.2 and a stable at discharge -Recommend repeat CBC at follow-up visit to follow hemoglobin trend  10-GERD: -will continue PPI  11-acute on chronic renal failure: stage 3 at baseline -improved/resolved after IVF's given -Cr level back to baseline at discharge -Recommend repeat basic metabolic panel to follow electrolytes and renal function at her follow-up visit with PCP  12-physical deconditioning -Per physical therapy evaluation patient close to her baseline. -Will discharge with  resumption of HH services for physical therapy and home health Nurse.  11-dizziness: -Will continue the use of meclizine as needed -Presumed to be associated with patient recent CVA  Procedures:  See below for x-ray reports   Consultations:  PCCM  Diabetes coordinator   Discharge Exam: Filed Vitals:   06/28/16 1607 06/28/16 1608  BP: 149/56 142/60  Pulse: 68 69  Temp:    Resp: 18 18    General: Afebrile, denies CP and SOB. Patient is AAOX3. Denies nausea, vomiting, abd pain and dysuria. Has mild  Hypoglycemia secondary to poor PO intake (said she doesn't like food here; also refused her glucerna after lunch). Patient reports some dizziness feeling (but neg orthostatic VS and per records with hx of intermittent dizziness, requiren use of meclizine).  Cardiovascular: S1 and S2, no rubs or gallops  Respiratory: good air movement, no wheezing, no crackles. Good O2 sat on RA  Abdomen: soft, NT, ND, positive BS  Musculoskeletal: no edema or cyanosis  Discharge Instructions   Discharge Instructions    Ambulatory referral to Nutrition and Diabetic Education    Complete by:  As directed   DKA- needs f/u for DKA and Type 1 diabetes     Diet - low sodium heart healthy    Complete by:  As directed      Discharge  instructions    Complete by:  As directed   Take medications as prescribed Maintain adequate hydration  Please arrange follow up with PCP in 10 days Follow up with Dr. Lucianne Muss to establish care with endocrinologist in [place]  Follow modified carbohydrates diet (but please do not skip any meals and drink your glucerna three times a day between meals)     Increase activity slowly    Complete by:  As directed           Current Discharge Medication List    START taking these medications   Details  iron polysaccharides (NIFEREX) 150 MG capsule Take 1 capsule (150 mg total) by mouth 2 (two) times daily. Qty: 60 capsule, Refills: 1      CONTINUE these medications which have CHANGED   Details  insulin glargine (LANTUS) 100 UNIT/ML injection Inject 0.1 mLs (10 Units total) into the skin daily. Qty: 10 mL, Refills: 2    insulin lispro (HUMALOG) 100 UNIT/ML injection Inject 0.04 mLs (4 Units total) into the skin 3 (three) times daily with meals. Reported on 06/22/2016 Qty: 10 mL, Refills: 2      CONTINUE these medications which have NOT CHANGED   Details  amLODipine (NORVASC) 5 MG tablet Take 1 tablet (5 mg total) by mouth daily. Qty: 30 tablet, Refills: 0    aspirin EC 81 MG EC tablet Take 1 tablet (81 mg total) by mouth daily. Qty: 90 tablet, Refills: 0    atorvastatin (LIPITOR) 10 MG tablet Take 1 tablet (10 mg total) by mouth  daily at 6 PM. Qty: 30 tablet, Refills: 0    carvedilol (COREG) 12.5 MG tablet Take 1 tablet (12.5 mg total) by mouth 2 (two) times daily with a meal. Qty: 60 tablet, Refills: 11    cetirizine (ZYRTEC) 10 MG tablet Take 10 mg by mouth daily as needed for allergies.    clopidogrel (PLAVIX) 75 MG tablet Take 1 tablet (75 mg total) by mouth daily. Qty: 90 tablet, Refills: 0    escitalopram (LEXAPRO) 10 MG tablet Take 1 tablet (10 mg total) by mouth at bedtime. Qty: 30 tablet, Refills: 0    folic acid (FOLVITE) 1 MG tablet Take 1 tablet (1 mg total) by  mouth daily. Qty: 30 tablet, Refills: 0    furosemide (LASIX) 20 MG tablet Take 1 tablet (20 mg total) by mouth daily. Qty: 30 tablet, Refills: 0    levothyroxine (SYNTHROID, LEVOTHROID) 75 MCG tablet Take 75 mcg by mouth daily.    LORazepam (ATIVAN) 0.5 MG tablet Take 1 tablet (0.5 mg total) by mouth every 8 (eight) hours as needed for anxiety. Qty: 20 tablet, Refills: 0    meclizine (ANTIVERT) 25 MG tablet Take 1 tablet (25 mg total) by mouth 3 (three) times daily as needed for dizziness. Qty: 30 tablet, Refills: 0    pantoprazole (PROTONIX) 40 MG tablet Take 1 tablet (40 mg total) by mouth 2 (two) times daily. Qty: 30 tablet, Refills: 0    saccharomyces boulardii (FLORASTOR) 250 MG capsule Take 1 capsule (250 mg total) by mouth 2 (two) times daily. Qty: 20 capsule, Refills: 0    sodium chloride (MURO 128) 5 % ophthalmic solution Place 1 drop into both eyes daily.    thiamine 100 MG tablet Take 1 tablet (100 mg total) by mouth daily. Qty: 30 tablet, Refills: 0    cyanocobalamin (,VITAMIN B-12,) 1000 MCG/ML injection Inject 1,000 mcg into the muscle every 30 (thirty) days. Last injection approx 3rd week of June, 2017    feeding supplement, GLUCERNA SHAKE, (GLUCERNA SHAKE) LIQD Take 237 mLs by mouth 3 (three) times daily between meals. Qty: 14 Can, Refills: 0    hydrocortisone (ANUSOL-HC) 2.5 % rectal cream Place rectally 4 (four) times daily. Qty: 30 g, Refills: 0    polyethylene glycol (MIRALAX / GLYCOLAX) packet Take 17 g by mouth every other day. Qty: 14 each, Refills: 0       Allergies  Allergen Reactions  . Ciprofloxacin Other (See Comments)    Pt does not recall specific reaction  . Codeine Diarrhea and Nausea And Vomiting  . Doxycycline Diarrhea and Nausea And Vomiting  . Hydrocodone Other (See Comments)    Pt does not recall specific reaction  . Omnicef [Cefdinir] Nausea Only and Other (See Comments)    Constipation, tolerated Zosyn   Follow-up Information     Follow up with [facility].   Specialty:  Home Health Services   Why:  Resume services established prior to admission   Contact information:   [address] [place] [address] [phone]       Follow up with Irving Copas, MD. Schedule an appointment as soon as possible for a visit in 10 days.   Specialty:  Family Medicine   Contact information:   [address] [place] [address] [phone]       Call Reather Littler, MD.   Specialty:  Endocrinology   Why:  office to set up appointment and establish care  Contact information:   [address] [place] [address] [phone]        The results of significant diagnostics from this hospitalization (including imaging, microbiology, ancillary and laboratory) are listed below for reference.    Significant Diagnostic Studies: Ct Head Wo Contrast  06/22/2016  CLINICAL DATA:  70 year old female with acute encephalopathy. EXAM: CT HEAD WITHOUT CONTRAST TECHNIQUE: Contiguous axial images were obtained from the base of the skull through the vertex without intravenous contrast. COMPARISON:  Head CT 05/06/2016. FINDINGS: Patchy and confluent areas of decreased attenuation are noted throughout the deep and periventricular white matter of the cerebral hemispheres bilaterally, compatible with chronic microvascular ischemic disease. No acute intracranial abnormalities. Specifically, no evidence of acute intracranial hemorrhage, no definite findings of acute/subacute cerebral ischemia, no mass, mass effect, hydrocephalus or abnormal intra or extra-axial fluid collections. Visualized paranasal sinuses and mastoids are well pneumatized. No acute displaced skull fractures are identified. IMPRESSION: 1. No acute intracranial abnormalities. 2. Chronic microvascular ischemic changes in cerebral white matter redemonstrated, as above. Electronically Signed   By: Trudie Reed M.D.    On: 06/22/2016 22:30   Dg Chest Portable 1 View  06/22/2016  CLINICAL DATA:  Cough.  History of pneumonia.  Smoker. EXAM: PORTABLE CHEST 1 VIEW COMPARISON:  05/10/2016 chest radiograph. FINDINGS: Surgical hardware from ACDF overlies the lower cervical spine. Stable cardiomediastinal silhouette with normal heart size. No pneumothorax. No pleural effusion. No pulmonary edema. No acute consolidative airspace disease. IMPRESSION: No active disease. Electronically Signed   By: Delbert Phenix M.D.   On: 06/22/2016 15:40    Microbiology: Recent Results (from the past 240 hour(s))  Culture, blood (routine x 2)     Status: None   Collection Time: 06/22/16  3:30 PM  Result Value Ref Range Status   Specimen Description RIGHT ANTECUBITAL  Final   Special Requests BOTTLES DRAWN AEROBIC AND ANAEROBIC 10CC  Final   Culture NO GROWTH 5 DAYS  Final   Report Status 06/27/2016 FINAL  Final  Culture, blood (routine x 2)     Status: None   Collection Time: 06/22/16  3:42 PM  Result Value Ref Range Status   Specimen Description BLOOD RIGHT HAND  Final   Special Requests IN PEDIATRIC BOTTLE 3CC  Final   Culture NO GROWTH 5 DAYS  Final   Report Status 06/27/2016 FINAL  Final  Urine culture     Status: None   Collection Time: 06/22/16  7:07 PM  Result Value Ref Range Status   Specimen Description URINE, CLEAN CATCH  Final   Special Requests Normal  Final   Culture NO GROWTH  Final   Report Status 06/23/2016 FINAL  Final  MRSA PCR Screening     Status: None   Collection Time: 06/22/16  8:15 PM  Result Value Ref Range Status   MRSA by PCR NEGATIVE NEGATIVE Final    Comment:        The GeneXpert MRSA Assay (FDA approved for NASAL specimens only), is one component of a comprehensive MRSA colonization surveillance program. It is not intended to diagnose MRSA infection nor to guide or monitor treatment for MRSA infections.      Labs: Basic Metabolic Panel:  Recent Labs Lab 06/22/16 2030   06/23/16 0435 06/23/16 9147  06/24/16 0403  06/25/16 0308 06/25/16 0538 06/25/16 1124 06/25/16 1558 06/26/16 0548 06/27/16 0617  NA  --   < > 138 138  < > 132*  < > 135 136 135  138 141 138  K  --   < > 3.0* 3.9  < > 4.3  < > 3.7 3.8 3.5 3.7 3.7 4.3  CL  --   < > 110 109  < > 104  < > 112* 109 109 111 115* 114*  CO2  --   < > 19* 17*  < > 10*  < > 16* 19* 14* 17* 21* 21*  GLUCOSE  --   < > 108* 109*  < > 531*  < > 138* 107* 232* 126* 63* 83  BUN  --   < > 45* 44*  < > 38*  < > 33* 32* 27* 24* 19 13  CREATININE  --   < > 2.67* 2.59*  < > 2.55*  < > 2.13* 2.11* 1.96* 1.71* 1.21* 0.96  CALCIUM  --   < > 7.1* 7.3*  < > 7.2*  < > 7.1* 7.7* 7.4* 7.6* 7.7* 7.8*  MG 2.3  --  1.8 1.8  --  1.8  --  1.6*  --   --   --   --   --   PHOS 5.8*  --  3.5 3.6  --  3.6  --  2.0*  --   --   --   --   --   < > = values in this interval not displayed. Liver Function Tests:  Recent Labs Lab 06/22/16 1526  AST 16  ALT 14  ALKPHOS 67  BILITOT 1.4*  PROT 6.1*  ALBUMIN 3.5    Recent Labs Lab 06/22/16 1526  LIPASE 138*   No results for input(s): AMMONIA in the last 168 hours.   CBC:  Recent Labs Lab 06/23/16 0435 06/24/16 0403 06/25/16 0308 06/25/16 0538 06/26/16 0548 06/27/16 0617  WBC 17.1* 14.3* 10.4  --  6.6 5.5  NEUTROABS 12.8*  --   --   --   --   --   HGB 7.6* 8.3* 6.6* 7.5* 7.1* 7.2*  HCT 22.2* 24.0* 19.6* 22.0* 21.2* 21.2*  MCV 91.7 93.0 91.2  --  91.8 93.0  PLT 247 232 212  --  200 173   CBG:  Recent Labs Lab 06/28/16 0456 06/28/16 0557 06/28/16 0833 06/28/16 1202 06/28/16 1655  GLUCAP 42* 90 171* 149* 50*    Signed:  Vassie Loll MD.  [facility] 06/28/2016, 5:30 PM

## 2016-06-28 NOTE — Progress Notes (Signed)
Hypoglycemic Event  CBG:42  Treatment: 15 GM carbohydrate snack  Symptoms: Pale  Follow-up CBG: [id number] CBG Result:90  Possible Reasons for Event: Inadequate meal intake  Comments/MD notified:md on call    Rhilynn Preyer, [employer] Sison

## 2016-06-28 NOTE — Progress Notes (Signed)
PT Cancellation Note  Patient Details Name: Cassandra [name] MRN: [id number] DOB: May 05, 1946   Cancelled Treatment:    Reason Eval/Treat Not Completed: Fatigue/lethargy limiting ability to participate.  Asks to wait for another time, and states she has not been out of bed yet today.   Cassandra [name], Cassandra [name] 06/28/2016, 1:30 PM    Cassandra [name], PT MS Acute Rehab Dept. Number: [facility] [id number] and [facility] [phone]

## 2016-06-28 NOTE — Progress Notes (Signed)
Inpatient Diabetes Program Recommendations  AACE/ADA: New Consensus Statement on Inpatient Glycemic Control (2015)  Target Ranges:  Prepandial:   less than 140 mg/dL      Peak postprandial:   less than 180 mg/dL (1-2 hours)      Critically ill patients:  140 - 180 mg/dL   Results for [facility], Mckayla A (MRN [id number]) as of 06/28/2016 10:44  Ref. Range 06/27/2016 08:01 06/27/2016 12:19 06/27/2016 17:18 06/27/2016 22:04 06/28/2016 04:56 06/28/2016 05:57 06/28/2016 08:33  Glucose-Capillary Latest Ref Range: 65-99 mg/dL 82 75 562103 (H) 97 42 (LL) 90 171 (H)   Review of Glycemic Control  Current orders for Inpatient glycemic control: Lantus 10 units QHS, Novolog 0-9 units TID with meals  Inpatient Diabetes Program Recommendations: Insulin - Basal: Note glucose of 42 mg/dl this morning at 1:304:56 am. Please consider decreasing Lantus to 7 units daily.  Thanks, Orlando PennerMarie Donato Studley, RN, MSN, CDE Diabetes Coordinator Inpatient Diabetes Program [phone] (Team Pager from 8am to 5pm) [phone] ([facility] office) [phone] [name] office) [phone] [name] office)

## 2016-06-28 NOTE — Progress Notes (Addendum)
Pt O2 sats dropping down to low 70s when she sits up in bed. Early this AM pt recovered within 15 min back to 89 to 95, bouncing back and forth until she completed her breakfast. Placed 2L 02 on pt and she recovered back to 100%. At lunch time same thing happened when she sat up to eat. O2 sats dropping down in the 70s with 2L of 02. Recovers back to 100% on 02 while laying down. Has stayed at 100% at rest. DC'd 02 while resting and tried to see if pt could ambulate while monitoring 02 sats on room air. States she is tired and gets very dizzy and light headed every time she tries to sit up or get up and did not want to get up. Will try again later.   Orthostatic vitals done (See vitals tab in flowsheets). 02 sats stayed between 97-100% on room air. Cont pulse ox machine in room was distinctively low when she first stood up but was not consistent with dynamap. Considering pulse ox machine possibly malfunctioning, however it did sync with dynamap before completion of orthostatic vitals. Pt states she still feels dizzy and light headed when she stands up. Will return when patient needs to use the bathroom and walk her to the bathroom while monitoring her sats. Husband does not want patient to go home today. Stating she did the same thing the last time she was in the hospital and ended up coming back to the ED.   Blood sugar checked 17:00 down to 50. Given some juice and will recheck in 15 min.   Recheck of blood sugar 73 after juice and crackers.   Phone call to husband and daughters with no answer at any of the numbers. Pt states her husband is at the sports bar and has no cell phone. He left because he thought patient was not going home today. [place] daughter [place] in [place] at work and she can not get her to come to pick her up and she does not want the other daughter to have to come pick her up either.   Informed her that if she stayed another night that she might be billed for services. Patient does not care  if she has to pay for another day. Patient very stern about not leaving today and is not happy about the way she feels like she is being pushed out the door. Informed charge nurse what was going on and let him know that patient was informed about being charged for another night.

## 2016-06-29 LAB — GLUCOSE, CAPILLARY
GLUCOSE-CAPILLARY: 71 mg/dL (ref 65–99)
GLUCOSE-CAPILLARY: 134 mg/dL — AB (ref 65–99)

## 2016-06-29 NOTE — Progress Notes (Signed)
Nsg Discharge Note  Admit Date:  06/22/2016 Discharge date: 06/29/2016   Cassandra [name] to be D/C'd Home per MD order.  AVS completed.  Copy for chart, and copy for patient signed, and dated. Patient/caregiver able to verbalize understanding.  Discharge Medication:   Medication List    TAKE these medications        amLODipine 5 MG tablet  Commonly known as:  NORVASC  Take 1 tablet (5 mg total) by mouth daily.     aspirin 81 MG EC tablet  Take 1 tablet (81 mg total) by mouth daily.     atorvastatin 10 MG tablet  Commonly known as:  LIPITOR  Take 1 tablet (10 mg total) by mouth daily at 6 PM.     carvedilol 12.5 MG tablet  Commonly known as:  COREG  Take 1 tablet (12.5 mg total) by mouth 2 (two) times daily with a meal.     cetirizine 10 MG tablet  Commonly known as:  ZYRTEC  Take 10 mg by mouth daily as needed for allergies.     clopidogrel 75 MG tablet  Commonly known as:  PLAVIX  Take 1 tablet (75 mg total) by mouth daily.     cyanocobalamin 1000 MCG/ML injection  Commonly known as:  (VITAMIN B-12)  Inject 1,000 mcg into the muscle every 30 (thirty) days. Last injection approx 3rd week of June, 2017     escitalopram 10 MG tablet  Commonly known as:  LEXAPRO  Take 1 tablet (10 mg total) by mouth at bedtime.     feeding supplement (GLUCERNA SHAKE) Liqd  Take 237 mLs by mouth 3 (three) times daily between meals.     folic acid 1 MG tablet  Commonly known as:  FOLVITE  Take 1 tablet (1 mg total) by mouth daily.     furosemide 20 MG tablet  Commonly known as:  LASIX  Take 1 tablet (20 mg total) by mouth daily.     hydrocortisone 2.5 % rectal cream  Commonly known as:  ANUSOL-HC  Place rectally 4 (four) times daily.     insulin glargine 100 UNIT/ML injection  Commonly known as:  LANTUS  Inject 0.1 mLs (10 Units total) into the skin daily.     insulin lispro 100 UNIT/ML injection  Commonly known as:  HUMALOG  Inject 0.04 mLs (4 Units total) into the skin 3  (three) times daily with meals. Reported on 06/22/2016     iron polysaccharides 150 MG capsule  Commonly known as:  NIFEREX  Take 1 capsule (150 mg total) by mouth 2 (two) times daily.     levothyroxine 75 MCG tablet  Commonly known as:  SYNTHROID, LEVOTHROID  Take 75 mcg by mouth daily.     LORazepam 0.5 MG tablet  Commonly known as:  ATIVAN  Take 1 tablet (0.5 mg total) by mouth every 8 (eight) hours as needed for anxiety.     meclizine 25 MG tablet  Commonly known as:  ANTIVERT  Take 1 tablet (25 mg total) by mouth 3 (three) times daily as needed for dizziness.     pantoprazole 40 MG tablet  Commonly known as:  PROTONIX  Take 1 tablet (40 mg total) by mouth 2 (two) times daily.     polyethylene glycol packet  Commonly known as:  MIRALAX / GLYCOLAX  Take 17 g by mouth every other day.     saccharomyces boulardii 250 MG capsule  Commonly known as:  FLORASTOR  Take 1  capsule (250 mg total) by mouth 2 (two) times daily.     sodium chloride 5 % ophthalmic solution  Commonly known as:  MURO 128  Place 1 drop into both eyes daily.     thiamine 100 MG tablet  Take 1 tablet (100 mg total) by mouth daily.        Discharge Assessment: Filed Vitals:   06/28/16 2112 06/29/16 0516  BP: 134/49 150/61  Pulse: 66 60  Temp: 99.8 F (37.7 C) 98.6 F (37 C)  Resp: 18 18   Skin clean, dry and intact without evidence of skin break down, no evidence of skin tears noted. IV catheter discontinued intact. Site without signs and symptoms of complications - no redness or edema noted at insertion site, patient denies c/o pain - only slight tenderness at site.  Dressing with slight pressure applied.  D/c Instructions-Education: Discharge instructions given to patient/family with verbalized understanding. D/c education completed with patient/family including follow up instructions, medication list, d/c activities limitations if indicated, with other d/c instructions as indicated by MD -  patient able to verbalize understanding, all questions fully answered. Patient instructed to return to ED, call 911, or call MD for any changes in condition.  Patient escorted via WC, and D/C home via private auto.  Camillo FlamingVicki L Cordarrel Stiefel, RN 06/29/2016 10:43 AM

## 2016-06-29 NOTE — Progress Notes (Signed)
Chart reviewed. Stable vital signs and no overnight issues. Discharge summary and medications accurate and appropriate. Patient can be discharge home from medical stand point and follow instructions and follow up visits as instructed.  Cassandra [name], Cassandra [name] [phone]

## 2016-07-01 ENCOUNTER — Other Ambulatory Visit: Payer: Self-pay | Admitting: Family

## 2016-07-01 DIAGNOSIS — Z1231 Encounter for screening mammogram for malignant neoplasm of breast: Secondary | ICD-10-CM

## 2016-07-08 ENCOUNTER — Telehealth: Payer: Self-pay

## 2016-07-08 ENCOUNTER — Ambulatory Visit: Payer: Medicare Other | Admitting: Neurology

## 2016-07-08 ENCOUNTER — Ambulatory Visit: Payer: Self-pay

## 2016-07-08 ENCOUNTER — Encounter: Payer: Self-pay | Admitting: Neurology

## 2016-07-08 NOTE — Telephone Encounter (Signed)
Patient did not show to new appt today 

## 2016-07-11 ENCOUNTER — Encounter (HOSPITAL_COMMUNITY): Payer: Self-pay | Admitting: *Deleted

## 2016-07-11 ENCOUNTER — Emergency Department (HOSPITAL_COMMUNITY): Payer: Medicare Other

## 2016-07-11 ENCOUNTER — Inpatient Hospital Stay (HOSPITAL_COMMUNITY)
Admission: EM | Admit: 2016-07-11 | Discharge: 2016-07-18 | DRG: 291 | Disposition: A | Discharge status: Home Health | Payer: Medicare Other | Attending: Internal Medicine | Admitting: Internal Medicine

## 2016-07-11 DIAGNOSIS — D638 Anemia in other chronic diseases classified elsewhere: Secondary | ICD-10-CM | POA: Diagnosis present

## 2016-07-11 DIAGNOSIS — Z794 Long term (current) use of insulin: Secondary | ICD-10-CM

## 2016-07-11 DIAGNOSIS — F319 Bipolar disorder, unspecified: Secondary | ICD-10-CM | POA: Diagnosis present

## 2016-07-11 DIAGNOSIS — F17210 Nicotine dependence, cigarettes, uncomplicated: Secondary | ICD-10-CM | POA: Diagnosis present

## 2016-07-11 DIAGNOSIS — Z888 Allergy status to other drugs, medicaments and biological substances status: Secondary | ICD-10-CM

## 2016-07-11 DIAGNOSIS — I10 Essential (primary) hypertension: Secondary | ICD-10-CM | POA: Diagnosis present

## 2016-07-11 DIAGNOSIS — E1165 Type 2 diabetes mellitus with hyperglycemia: Secondary | ICD-10-CM | POA: Diagnosis present

## 2016-07-11 DIAGNOSIS — E10649 Type 1 diabetes mellitus with hypoglycemia without coma: Secondary | ICD-10-CM | POA: Diagnosis present

## 2016-07-11 DIAGNOSIS — Z7982 Long term (current) use of aspirin: Secondary | ICD-10-CM

## 2016-07-11 DIAGNOSIS — I739 Peripheral vascular disease, unspecified: Secondary | ICD-10-CM | POA: Diagnosis present

## 2016-07-11 DIAGNOSIS — I130 Hypertensive heart and chronic kidney disease with heart failure and stage 1 through stage 4 chronic kidney disease, or unspecified chronic kidney disease: Secondary | ICD-10-CM | POA: Diagnosis not present

## 2016-07-11 DIAGNOSIS — I519 Heart disease, unspecified: Secondary | ICD-10-CM | POA: Diagnosis present

## 2016-07-11 DIAGNOSIS — I5031 Acute diastolic (congestive) heart failure: Secondary | ICD-10-CM | POA: Diagnosis not present

## 2016-07-11 DIAGNOSIS — R29700 NIHSS score 0: Secondary | ICD-10-CM | POA: Diagnosis present

## 2016-07-11 DIAGNOSIS — F419 Anxiety disorder, unspecified: Secondary | ICD-10-CM | POA: Diagnosis present

## 2016-07-11 DIAGNOSIS — Z7902 Long term (current) use of antithrombotics/antiplatelets: Secondary | ICD-10-CM

## 2016-07-11 DIAGNOSIS — E039 Hypothyroidism, unspecified: Secondary | ICD-10-CM | POA: Diagnosis present

## 2016-07-11 DIAGNOSIS — Z8249 Family history of ischemic heart disease and other diseases of the circulatory system: Secondary | ICD-10-CM

## 2016-07-11 DIAGNOSIS — I638 Other cerebral infarction: Secondary | ICD-10-CM | POA: Diagnosis present

## 2016-07-11 DIAGNOSIS — Z8673 Personal history of transient ischemic attack (TIA), and cerebral infarction without residual deficits: Secondary | ICD-10-CM

## 2016-07-11 DIAGNOSIS — E785 Hyperlipidemia, unspecified: Secondary | ICD-10-CM | POA: Diagnosis present

## 2016-07-11 DIAGNOSIS — R42 Dizziness and giddiness: Secondary | ICD-10-CM | POA: Diagnosis not present

## 2016-07-11 DIAGNOSIS — G43909 Migraine, unspecified, not intractable, without status migrainosus: Secondary | ICD-10-CM | POA: Diagnosis present

## 2016-07-11 DIAGNOSIS — I6330 Cerebral infarction due to thrombosis of unspecified cerebral artery: Secondary | ICD-10-CM

## 2016-07-11 DIAGNOSIS — I5189 Other ill-defined heart diseases: Secondary | ICD-10-CM | POA: Diagnosis present

## 2016-07-11 DIAGNOSIS — R55 Syncope and collapse: Secondary | ICD-10-CM

## 2016-07-11 DIAGNOSIS — F418 Other specified anxiety disorders: Secondary | ICD-10-CM | POA: Diagnosis present

## 2016-07-11 DIAGNOSIS — Z823 Family history of stroke: Secondary | ICD-10-CM

## 2016-07-11 DIAGNOSIS — E860 Dehydration: Secondary | ICD-10-CM

## 2016-07-11 DIAGNOSIS — F32A Depression, unspecified: Secondary | ICD-10-CM | POA: Diagnosis present

## 2016-07-11 DIAGNOSIS — I6340 Cerebral infarction due to embolism of unspecified cerebral artery: Secondary | ICD-10-CM | POA: Insufficient documentation

## 2016-07-11 DIAGNOSIS — E1022 Type 1 diabetes mellitus with diabetic chronic kidney disease: Secondary | ICD-10-CM | POA: Diagnosis present

## 2016-07-11 DIAGNOSIS — N183 Chronic kidney disease, stage 3 unspecified: Secondary | ICD-10-CM | POA: Diagnosis present

## 2016-07-11 DIAGNOSIS — I639 Cerebral infarction, unspecified: Secondary | ICD-10-CM

## 2016-07-11 DIAGNOSIS — K219 Gastro-esophageal reflux disease without esophagitis: Secondary | ICD-10-CM | POA: Diagnosis present

## 2016-07-11 DIAGNOSIS — E1010 Type 1 diabetes mellitus with ketoacidosis without coma: Secondary | ICD-10-CM | POA: Diagnosis present

## 2016-07-11 DIAGNOSIS — T502X5A Adverse effect of carbonic-anhydrase inhibitors, benzothiadiazides and other diuretics, initial encounter: Secondary | ICD-10-CM | POA: Diagnosis present

## 2016-07-11 DIAGNOSIS — E11649 Type 2 diabetes mellitus with hypoglycemia without coma: Secondary | ICD-10-CM

## 2016-07-11 DIAGNOSIS — IMO0002 Reserved for concepts with insufficient information to code with codable children: Secondary | ICD-10-CM | POA: Diagnosis present

## 2016-07-11 DIAGNOSIS — F329 Major depressive disorder, single episode, unspecified: Secondary | ICD-10-CM | POA: Diagnosis present

## 2016-07-11 DIAGNOSIS — I951 Orthostatic hypotension: Secondary | ICD-10-CM | POA: Diagnosis present

## 2016-07-11 DIAGNOSIS — Z79899 Other long term (current) drug therapy: Secondary | ICD-10-CM

## 2016-07-11 LAB — COMPREHENSIVE METABOLIC PANEL
ALT: 14 U/L (ref 14–54)
AST: 20 U/L (ref 15–41)
Albumin: 3.5 g/dL (ref 3.5–5.0)
Alkaline Phosphatase: 64 U/L (ref 38–126)
Anion gap: 9 (ref 5–15)
BUN: 21 mg/dL — ABNORMAL HIGH (ref 6–20)
CO2: 23 mmol/L (ref 22–32)
Calcium: 9.3 mg/dL (ref 8.9–10.3)
Chloride: 106 mmol/L (ref 101–111)
Creatinine, Ser: 1.4 mg/dL — ABNORMAL HIGH (ref 0.44–1.00)
GFR calc Af Amer: 44 mL/min — ABNORMAL LOW (ref >60)
GFR calc non Af Amer: 38 mL/min — ABNORMAL LOW (ref >60)
Glucose, Bld: 125 mg/dL — ABNORMAL HIGH (ref 65–99)
Potassium: 4.5 mmol/L (ref 3.5–5.1)
Sodium: 138 mmol/L (ref 135–145)
Total Bilirubin: 0.4 mg/dL (ref 0.3–1.2)
Total Protein: 6.2 g/dL — ABNORMAL LOW (ref 6.5–8.1)

## 2016-07-11 LAB — CBC
HCT: 27.7 % — ABNORMAL LOW (ref 36.0–46.0)
Hemoglobin: 9.4 g/dL — ABNORMAL LOW (ref 12.0–15.0)
MCH: 30.9 pg (ref 26.0–34.0)
MCHC: 33.9 g/dL (ref 30.0–36.0)
MCV: 91.1 fL (ref 78.0–100.0)
Platelets: 297 10*3/uL (ref 150–400)
RBC: 3.04 MIL/uL — ABNORMAL LOW (ref 3.87–5.11)
RDW: 13.6 % (ref 11.5–15.5)
WBC: 10.7 10*3/uL — ABNORMAL HIGH (ref 4.0–10.5)

## 2016-07-11 LAB — DIFFERENTIAL
Basophils Absolute: 0.1 10*3/uL (ref 0.0–0.1)
Basophils Relative: 1 %
Eosinophils Absolute: 0.4 10*3/uL (ref 0.0–0.7)
Eosinophils Relative: 3 %
Lymphocytes Relative: 22 %
Lymphs Abs: 2.3 10*3/uL (ref 0.7–4.0)
Monocytes Absolute: 0.5 10*3/uL (ref 0.1–1.0)
Monocytes Relative: 4 %
Neutro Abs: 7.6 10*3/uL (ref 1.7–7.7)
Neutrophils Relative %: 70 %

## 2016-07-11 LAB — URINALYSIS, ROUTINE W REFLEX MICROSCOPIC
BILIRUBIN URINE: NEGATIVE
Glucose, UA: NEGATIVE mg/dL
Hgb urine dipstick: NEGATIVE
KETONES UR: NEGATIVE mg/dL
LEUKOCYTES UA: NEGATIVE
NITRITE: NEGATIVE
PROTEIN: 100 mg/dL — AB
Specific Gravity, Urine: 1.016 (ref 1.005–1.030)
pH: 5 (ref 5.0–8.0)

## 2016-07-11 LAB — I-STAT TROPONIN, ED: Troponin i, poc: 0.01 ng/mL (ref 0.00–0.08)

## 2016-07-11 LAB — CBG MONITORING, ED
GLUCOSE-CAPILLARY: 32 mg/dL — AB (ref 65–99)
GLUCOSE-CAPILLARY: 64 mg/dL — AB (ref 65–99)
Glucose-Capillary: 140 mg/dL — ABNORMAL HIGH (ref 65–99)
Glucose-Capillary: 247 mg/dL — ABNORMAL HIGH (ref 65–99)

## 2016-07-11 LAB — I-STAT CHEM 8, ED
BUN: 24 mg/dL — ABNORMAL HIGH (ref 6–20)
Calcium, Ion: 1.15 mmol/L (ref 1.12–1.23)
Chloride: 107 mmol/L (ref 101–111)
Creatinine, Ser: 1.3 mg/dL — ABNORMAL HIGH (ref 0.44–1.00)
Glucose, Bld: 121 mg/dL — ABNORMAL HIGH (ref 65–99)
HCT: 29 % — ABNORMAL LOW (ref 36.0–46.0)
Hemoglobin: 9.9 g/dL — ABNORMAL LOW (ref 12.0–15.0)
Potassium: 4.6 mmol/L (ref 3.5–5.1)
Sodium: 139 mmol/L (ref 135–145)
TCO2: 24 mmol/L (ref 0–100)

## 2016-07-11 LAB — URINE MICROSCOPIC-ADD ON

## 2016-07-11 LAB — APTT: aPTT: 31 seconds (ref 24–36)

## 2016-07-11 LAB — PROTIME-INR
INR: 0.9
Prothrombin Time: 12.2 seconds (ref 11.4–15.2)

## 2016-07-11 LAB — BRAIN NATRIURETIC PEPTIDE: B Natriuretic Peptide: 469.1 pg/mL — ABNORMAL HIGH (ref 0.0–100.0)

## 2016-07-11 MED ORDER — LORATADINE 10 MG PO TABS
10.0000 mg | ORAL_TABLET | Freq: Every day | ORAL | Status: DC
  Administered 2016-07-12 – 2016-07-18 (×7): 10 mg via ORAL
  Filled 2016-07-11 (×7): qty 1

## 2016-07-11 MED ORDER — SODIUM CHLORIDE (HYPERTONIC) 5 % OP SOLN
1.0000 [drp] | Freq: Every day | OPHTHALMIC | Status: DC
  Administered 2016-07-12 – 2016-07-18 (×7): 1 [drp] via OPHTHALMIC
  Filled 2016-07-11 (×2): qty 15

## 2016-07-11 MED ORDER — CLOPIDOGREL BISULFATE 75 MG PO TABS
75.0000 mg | ORAL_TABLET | Freq: Every day | ORAL | Status: DC
Start: 2016-07-12 — End: 2016-07-18
  Administered 2016-07-12 – 2016-07-18 (×7): 75 mg via ORAL
  Filled 2016-07-11 (×7): qty 1

## 2016-07-11 MED ORDER — SODIUM CHLORIDE 0.9% FLUSH
3.0000 mL | Freq: Two times a day (BID) | INTRAVENOUS | Status: DC
  Administered 2016-07-12 – 2016-07-13 (×3): 3 mL via INTRAVENOUS
  Administered 2016-07-14: 10 mL via INTRAVENOUS
  Administered 2016-07-15 – 2016-07-17 (×3): 3 mL via INTRAVENOUS

## 2016-07-11 MED ORDER — ASPIRIN EC 81 MG PO TBEC
81.0000 mg | DELAYED_RELEASE_TABLET | Freq: Every day | ORAL | Status: DC
  Administered 2016-07-12 – 2016-07-18 (×7): 81 mg via ORAL
  Filled 2016-07-11 (×7): qty 1

## 2016-07-11 MED ORDER — SODIUM CHLORIDE 0.9% FLUSH: 3.0000 mL | INTRAVENOUS | Status: DC | PRN

## 2016-07-11 MED ORDER — FOLIC ACID 1 MG PO TABS
1.0000 mg | ORAL_TABLET | Freq: Every day | ORAL | Status: DC
  Administered 2016-07-12 – 2016-07-18 (×7): 1 mg via ORAL
  Filled 2016-07-11 (×7): qty 1

## 2016-07-11 MED ORDER — MECLIZINE HCL 12.5 MG PO TABS
12.5000 mg | ORAL_TABLET | Freq: Three times a day (TID) | ORAL | Status: DC | PRN
  Administered 2016-07-12 – 2016-07-17 (×4): 12.5 mg via ORAL
  Filled 2016-07-11 (×4): qty 1

## 2016-07-11 MED ORDER — DEXTROSE 50 % IV SOLN
1.0000 | Freq: Once | INTRAVENOUS | Status: AC
  Administered 2016-07-11: 50 mL via INTRAVENOUS
  Filled 2016-07-11: qty 50

## 2016-07-11 MED ORDER — SODIUM CHLORIDE 0.9% FLUSH
3.0000 mL | Freq: Two times a day (BID) | INTRAVENOUS | Status: DC
  Administered 2016-07-12 – 2016-07-17 (×11): 3 mL via INTRAVENOUS

## 2016-07-11 MED ORDER — FUROSEMIDE 10 MG/ML IJ SOLN
20.0000 mg | Freq: Two times a day (BID) | INTRAMUSCULAR | Status: DC
  Administered 2016-07-12 – 2016-07-14 (×5): 20 mg via INTRAVENOUS
  Filled 2016-07-11 (×3): qty 4
  Filled 2016-07-11: qty 2
  Filled 2016-07-11: qty 4

## 2016-07-11 MED ORDER — SACCHAROMYCES BOULARDII 250 MG PO CAPS
250.0000 mg | ORAL_CAPSULE | Freq: Two times a day (BID) | ORAL | Status: DC
  Administered 2016-07-12 – 2016-07-18 (×14): 250 mg via ORAL
  Filled 2016-07-11 (×14): qty 1

## 2016-07-11 MED ORDER — SODIUM CHLORIDE 0.9 % IV SOLN: 250.0000 mL | INTRAVENOUS | Status: DC | PRN

## 2016-07-11 MED ORDER — LEVOTHYROXINE SODIUM 75 MCG PO TABS
75.0000 ug | ORAL_TABLET | Freq: Every day | ORAL | Status: DC
  Administered 2016-07-12 – 2016-07-18 (×5): 75 ug via ORAL
  Filled 2016-07-11 (×6): qty 1

## 2016-07-11 MED ORDER — PANTOPRAZOLE SODIUM 40 MG PO TBEC
40.0000 mg | DELAYED_RELEASE_TABLET | Freq: Two times a day (BID) | ORAL | Status: DC
  Administered 2016-07-12 – 2016-07-18 (×14): 40 mg via ORAL
  Filled 2016-07-11 (×14): qty 1

## 2016-07-11 MED ORDER — LORAZEPAM 0.5 MG PO TABS
0.5000 mg | ORAL_TABLET | Freq: Three times a day (TID) | ORAL | Status: DC | PRN
  Administered 2016-07-12 – 2016-07-17 (×3): 0.5 mg via ORAL
  Filled 2016-07-11 (×3): qty 1

## 2016-07-11 MED ORDER — INSULIN GLARGINE 100 UNIT/ML ~~LOC~~ SOLN
10.0000 [IU] | Freq: Every day | SUBCUTANEOUS | Status: DC
  Administered 2016-07-12: 10 [IU] via SUBCUTANEOUS
  Filled 2016-07-11 (×2): qty 0.1

## 2016-07-11 MED ORDER — POLYSACCHARIDE IRON COMPLEX 150 MG PO CAPS
150.0000 mg | ORAL_CAPSULE | Freq: Two times a day (BID) | ORAL | Status: DC
  Administered 2016-07-12 – 2016-07-18 (×13): 150 mg via ORAL
  Filled 2016-07-11 (×14): qty 1

## 2016-07-11 MED ORDER — DEXTROSE 50 % IV SOLN: 1.0000 | Freq: Once | INTRAVENOUS | Status: DC

## 2016-07-11 MED ORDER — CYANOCOBALAMIN 1000 MCG/ML IJ SOLN
1000.0000 ug | INTRAMUSCULAR | Status: DC
  Administered 2016-07-12: 1000 ug via INTRAMUSCULAR
  Filled 2016-07-11: qty 1

## 2016-07-11 MED ORDER — FUROSEMIDE 10 MG/ML IJ SOLN
20.0000 mg | Freq: Once | INTRAMUSCULAR | Status: AC
  Administered 2016-07-11: 20 mg via INTRAVENOUS
  Filled 2016-07-11: qty 2

## 2016-07-11 MED ORDER — ENOXAPARIN SODIUM 40 MG/0.4ML ~~LOC~~ SOLN
40.0000 mg | Freq: Every day | SUBCUTANEOUS | Status: DC
  Administered 2016-07-12 – 2016-07-13 (×3): 40 mg via SUBCUTANEOUS
  Filled 2016-07-11 (×3): qty 0.4

## 2016-07-11 MED ORDER — ATORVASTATIN CALCIUM 10 MG PO TABS
10.0000 mg | ORAL_TABLET | Freq: Every day | ORAL | Status: DC
  Administered 2016-07-12 – 2016-07-17 (×7): 10 mg via ORAL
  Filled 2016-07-11 (×7): qty 1

## 2016-07-11 MED ORDER — POLYETHYLENE GLYCOL 3350 17 G PO PACK
17.0000 g | PACK | ORAL | Status: DC
  Administered 2016-07-12 – 2016-07-18 (×3): 17 g via ORAL
  Filled 2016-07-11 (×6): qty 1

## 2016-07-11 MED ORDER — AMLODIPINE BESYLATE 5 MG PO TABS
5.0000 mg | ORAL_TABLET | Freq: Every day | ORAL | Status: DC
  Administered 2016-07-12 – 2016-07-14 (×3): 5 mg via ORAL
  Filled 2016-07-11 (×4): qty 1

## 2016-07-11 MED ORDER — ESCITALOPRAM OXALATE 10 MG PO TABS
10.0000 mg | ORAL_TABLET | Freq: Every day | ORAL | Status: DC
  Administered 2016-07-12 – 2016-07-17 (×7): 10 mg via ORAL
  Filled 2016-07-11 (×7): qty 1

## 2016-07-11 MED ORDER — VITAMIN B-1 100 MG PO TABS
100.0000 mg | ORAL_TABLET | Freq: Every day | ORAL | Status: DC
Start: 2016-07-12 — End: 2016-07-18
  Administered 2016-07-12 – 2016-07-18 (×7): 100 mg via ORAL
  Filled 2016-07-11 (×7): qty 1

## 2016-07-11 MED ORDER — GLUCERNA SHAKE PO LIQD
237.0000 mL | Freq: Three times a day (TID) | ORAL | Status: DC
  Administered 2016-07-13 – 2016-07-17 (×3): 237 mL via ORAL
  Filled 2016-07-11 (×21): qty 237

## 2016-07-11 NOTE — ED Notes (Signed)
cbg 32 notiifed nurse

## 2016-07-11 NOTE — ED Notes (Signed)
Patient transported to X-ray 

## 2016-07-11 NOTE — ED Notes (Signed)
Notified MD of pt's CBG, gave OJ and bag lunch per provider

## 2016-07-11 NOTE — H&P (Signed)
History and Physical    TELITHA PLATH GLO:[id number] DOB: 03-Jul-1946 DOA: 07/11/2016  PCP: Irving Copas, MD   Patient coming from: Home.  Chief Complaint: Dizziness.  HPI: Cassandra [name] is a 70 y.o. female with medical history significant of anxiety, bipolar disorder, depression, essential hypertension, type 2 diabetes, hyperlipidemia, hypothyroidism, benign paroxysmal positional vertigo, cervicalgia, chronic kidney disease, GERD, elevated liver enzymes, migraine headaches, recent CVA, history of alcohol abuse, history of tobacco use disorder, vitamin B12 deficiency, psoriasis, glaucoma who comes to the emergency department with complaints of dizziness since yesterday associated with dyspnea, weakness and progressively worse pitting edema of the lower extremities.   She denies chest pain, palpitations, diaphoresis, PND or orthopnea. She states that she has been watching her sodium intake, but she has not taken her furosemide in the past 2 weeks for nonspecific reasons. She is a 5 cigarette a day smoker and has a drink of alcohol in the evening several times a week.  ED Course: The patient was hypoglycemic in the ED and was treated with IV dextrose. She was also given IV furosemide in the emergency department. Workup is significant for elevated BMP level at 469 pg/L. Chest radiograph showed emphysema, but no edema.  Review of Systems: As per HPI otherwise 10 point review of systems negative.    Past Medical History:  Diagnosis Date  . Allergic rhinitis   . Anxiety   . Benign hypertension with CKD (chronic kidney disease) stage III   . Benign paroxysmal positional vertigo   . Bipolar disorder (HCC)   . Broken finger   . Broken shoulder   . Broken toes   . Cervicalgia   . CKD (chronic kidney disease) stage 3, GFR 30-59 ml/min   . Depression   . Diabetes mellitus without complication (HCC)   . Elevated liver enzymes Hep B/C neg 2014  . GERD (gastroesophageal reflux  disease)   . Glaucoma   . History of alcohol use   . Hypertension   . Hypothyroidism   . Interstitial cystitis    bladder stretched every 9 months  . Migraines   . Psoriasis   . Stroke (HCC) 05/16/2016   Left occipital and thalamic, right hippocampal  . Thyroid disease   . Tobacco use   . 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 Vitamin B12 deficiency     Past Surgical History:  Procedure Laterality Date  . ABDOMINAL HYSTERECTOMY     with oophorectomy  . APPENDECTOMY    . BLADDER SURGERY    . CATARACT EXTRACTION    . CERVICAL DISC SURGERY    . CHOLECYSTECTOMY    . HERNIA REPAIR    . TONSILLECTOMY       reports that she has been smoking Cigarettes.  She has been smoking about 0.25 packs per day. She has never used smokeless tobacco. She reports that she does not drink alcohol or use drugs.  Allergies  Allergen Reactions  . Ciprofloxacin Other (See Comments)    Pt does not recall specific reaction  . Codeine Diarrhea and Nausea And Vomiting  . Doxycycline Diarrhea and Nausea And Vomiting  . Hydrocodone Other (See Comments)    Pt does not recall specific reaction  . Omnicef [Cefdinir] Nausea Only and Other (See Comments)    Constipation, tolerated Zosyn    Family History  Problem Relation Age of Onset  . Alcohol abuse Mother   . Arthritis Mother   . Asthma Mother   .  Cancer Mother     colon cancer  . Hypertension Mother   . Migraines Mother   . Stroke Mother   . Lung disease Mother   . COPD Mother   . Diabetes Father   . Hypertension Father   . Heart disease Father   . Heart attack Father   . Heart disease Paternal Grandmother   . Diabetes Paternal Grandmother   . Stroke Paternal Grandmother   . Cancer Paternal Grandmother   . Diabetes Paternal Grandfather      Prior to Admission medications   Medication Sig Start Date End Date Taking? Authorizing Provider  amLODipine (NORVASC) 5 MG tablet Take 1 tablet (5 mg total) by mouth daily.  05/19/16  Yes Rolly Salter, MD  carvedilol (COREG) 12.5 MG tablet Take 1 tablet (12.5 mg total) by mouth 2 (two) times daily with a meal. 02/12/16  Yes Chilton Si, MD  aspirin EC 81 MG EC tablet Take 1 tablet (81 mg total) by mouth daily. 05/19/16   Rolly Salter, MD  atorvastatin (LIPITOR) 10 MG tablet Take 1 tablet (10 mg total) by mouth daily at 6 PM. Patient taking differently: Take 10 mg by mouth at bedtime.  05/19/16   Rolly Salter, MD  cetirizine (ZYRTEC) 10 MG tablet Take 10 mg by mouth daily as needed for allergies.    Historical Provider, MD  clopidogrel (PLAVIX) 75 MG tablet Take 1 tablet (75 mg total) by mouth daily. 05/19/16 08/19/16  Rolly Salter, MD  cyanocobalamin (,VITAMIN B-12,) 1000 MCG/ML injection Inject 1,000 mcg into the muscle every 30 (thirty) days. Last injection approx 3rd week of June, 2017    Historical Provider, MD  escitalopram (LEXAPRO) 10 MG tablet Take 1 tablet (10 mg total) by mouth at bedtime. 05/19/16   Rolly Salter, MD  feeding supplement, GLUCERNA SHAKE, (GLUCERNA SHAKE) LIQD Take 237 mLs by mouth 3 (three) times daily between meals. Patient not taking: Reported on 06/22/2016 05/19/16   Rolly Salter, MD  folic acid (FOLVITE) 1 MG tablet Take 1 tablet (1 mg total) by mouth daily. 05/19/16   Rolly Salter, MD  furosemide (LASIX) 20 MG tablet Take 1 tablet (20 mg total) by mouth daily. 05/19/16   Rolly Salter, MD  hydrocortisone (ANUSOL-HC) 2.5 % rectal cream Place rectally 4 (four) times daily. Patient not taking: Reported on 06/22/2016 05/19/16   Rolly Salter, MD  insulin glargine (LANTUS) 100 UNIT/ML injection Inject 0.1 mLs (10 Units total) into the skin daily. 06/28/16   Vassie Loll, MD  insulin lispro (HUMALOG) 100 UNIT/ML injection Inject 0.04 mLs (4 Units total) into the skin 3 (three) times daily with meals. Reported on 06/22/2016 06/28/16   Vassie Loll, MD  iron polysaccharides (NIFEREX) 150 MG capsule Take 1 capsule (150 mg total) by mouth 2 (two) times  daily. 06/28/16   Vassie Loll, MD  levothyroxine (SYNTHROID, LEVOTHROID) 75 MCG tablet Take 75 mcg by mouth daily. 05/30/16   Historical Provider, MD  LORazepam (ATIVAN) 0.5 MG tablet Take 1 tablet (0.5 mg total) by mouth every 8 (eight) hours as needed for anxiety. 05/19/16   Rolly Salter, MD  meclizine (ANTIVERT) 25 MG tablet Take 1 tablet (25 mg total) by mouth 3 (three) times daily as needed for dizziness. Patient taking differently: Take 12.5 mg by mouth 3 (three) times daily as needed for dizziness.  02/12/16   Chilton Si, MD  pantoprazole (PROTONIX) 40 MG tablet Take 1 tablet (40 mg  total) by mouth 2 (two) times daily. 05/19/16   Rolly Salter, MD  polyethylene glycol (MIRALAX / Ethelene Hal) packet Take 17 g by mouth every other day. Patient not taking: Reported on 06/22/2016 05/19/16   Rolly Salter, MD  saccharomyces boulardii (FLORASTOR) 250 MG capsule Take 1 capsule (250 mg total) by mouth 2 (two) times daily. 05/19/16   Rolly Salter, MD  sodium chloride (MURO 128) 5 % ophthalmic solution Place 1 drop into both eyes daily.    Historical Provider, MD  thiamine 100 MG tablet Take 1 tablet (100 mg total) by mouth daily. 05/19/16   Rolly Salter, MD    Physical Exam: Vitals:   07/11/16 2200 07/11/16 2215 07/11/16 2230 07/11/16 2245  BP: 131/89 132/72 135/73 127/60  Pulse: 70 72 70 70  Resp: 13 18 15 11   Temp:      TempSrc:      SpO2: 97% 99% 97% 98%  Weight:      Height:          Constitutional: NAD, calm, comfortable Vitals:   07/11/16 2200 07/11/16 2215 07/11/16 2230 07/11/16 2245  BP: 131/89 132/72 135/73 127/60  Pulse: 70 72 70 70  Resp: 13 18 15 11   Temp:      TempSrc:      SpO2: 97% 99% 97% 98%  Weight:      Height:       Eyes: PERRL, lids and conjunctivae normal ENMT: Mucous membranes are moist. Posterior pharynx clear of any exudate or lesions.  Neck: normal, supple, no masses, no thyromegaly Respiratory: Bibasilar rales, no wheezing, no crackles. Normal  respiratory effort. No accessory muscle use.  Cardiovascular: Regular rate and rhythm, no murmurs / rubs / gallops. 2+ extremity edema. 2+ pedal pulses. No carotid bruits.  Abdomen: no tenderness, no masses palpated. No hepatosplenomegaly. Bowel sounds positive.  Musculoskeletal: no clubbing / cyanosis. No joint deformity upper and lower extremities. Good ROM, no contractures. Normal muscle tone.  Skin: no rashes, lesions, ulcers. No induration Neurologic: CN 2-12 grossly intact. Sensation intact, DTR normal. Strength 5/5 in all 4.  Psychiatric: Normal judgment and insight. Alert and oriented x 4. Normal mood.     Labs on Admission: I have personally reviewed following labs and imaging studies  CBC:  Recent Labs Lab 07/11/16 1600 07/11/16 1720  WBC 10.7*  --   NEUTROABS 7.6  --   HGB 9.4* 9.9*  HCT 27.7* 29.0*  MCV 91.1  --   PLT 297  --    Basic Metabolic Panel:  Recent Labs Lab 07/11/16 1600 07/11/16 1720  NA 138 139  K 4.5 4.6  CL 106 107  CO2 23  --   GLUCOSE 125* 121*  BUN 21* 24*  CREATININE 1.40* 1.30*  CALCIUM 9.3  --    GFR: Estimated Creatinine Clearance: 31.5 mL/min (by C-G formula based on SCr of 1.3 mg/dL). Liver Function Tests:  Recent Labs Lab 07/11/16 1600  AST 20  ALT 14  ALKPHOS 64  BILITOT 0.4  PROT 6.2*  ALBUMIN 3.5   Coagulation Profile:  Recent Labs Lab 07/11/16 1600  INR 0.90   CBG:  Recent Labs Lab 07/11/16 1709 07/11/16 2007 07/11/16 2043 07/11/16 2135  GLUCAP 140* 32* 64* 247*   Urine analysis:    Component Value Date/Time   COLORURINE YELLOW 07/11/2016 2107   APPEARANCEUR CLEAR 07/11/2016 2107   APPEARANCEUR Clear 06/15/2014 1508   LABSPEC 1.016 07/11/2016 2107   LABSPEC 1.010 06/15/2014 1508  PHURINE 5.0 07/11/2016 2107   GLUCOSEU NEGATIVE 07/11/2016 2107   GLUCOSEU Negative 06/15/2014 1508   HGBUR NEGATIVE 07/11/2016 2107   BILIRUBINUR NEGATIVE 07/11/2016 2107   BILIRUBINUR Negative 06/15/2014 1508    KETONESUR NEGATIVE 07/11/2016 2107   PROTEINUR 100 (A) 07/11/2016 2107   UROBILINOGEN 0.2 09/14/2015 1617   NITRITE NEGATIVE 07/11/2016 2107   LEUKOCYTESUR NEGATIVE 07/11/2016 2107   LEUKOCYTESUR Negative 06/15/2014 1508    Radiological Exams on Admission: Dg Chest 2 View  Result Date: 07/11/2016 CLINICAL DATA:  Bilateral lower extremity swelling for the past 2 days. EXAM: CHEST  2 VIEW COMPARISON:  06/22/2016; 05/10/2016; chest CT - 05/04/2016 FINDINGS: Grossly unchanged borderline enlarged cardiac silhouette. Normal mediastinal contours. Atherosclerotic plaque when the thoracic aorta. The lungs appear hyperexpanded with flattening of the bilateral hemidiaphragms mild diffuse slightly nodular thickening of the pulmonary interstitium. Interval development of a trace right-sided effusion with associated right basilar heterogeneous opacities. No definite evidence of edema. No acute osseus abnormalities. Post lower cervical ACDF, incompletely evaluated. IMPRESSION: 1. Interval development of a trace right-sided effusion with associated right basilar opacities, atelectasis versus infiltrate. A follow-up chest radiograph in 3 to 4 weeks after treatment is recommended to ensure resolution. 2. Cardiomegaly without evidence of edema. 3.  Emphysema. (ICD10-J43.9) 4.  Aortic Atherosclerosis (ICD10-170.0) Electronically Signed   By: Simonne Come M.D.   On: 07/11/2016 21:23  Ct Head Wo Contrast  Result Date: 07/11/2016 CLINICAL DATA:  Weakness with dizziness and blurred vision. Headache. EXAM: CT HEAD WITHOUT CONTRAST TECHNIQUE: Contiguous axial images were obtained from the base of the skull through the vertex without intravenous contrast. COMPARISON:  06/22/2016. FINDINGS: There is no evidence for acute hemorrhage, hydrocephalus, mass lesion, or abnormal extra-axial fluid collection. No definite CT evidence for acute infarction. Diffuse loss of parenchymal volume is consistent with atrophy. Patchy low  attenuation in the deep hemispheric and periventricular white matter is nonspecific, but likely reflects chronic microvascular ischemic demyelination. The visualized paranasal sinuses and mastoid air cells are clear. IMPRESSION: Stable.  No acute intracranial abnormality. Atrophy with chronic small vessel white matter ischemic disease. Electronically Signed   By: Kennith Center M.D.   On: 07/11/2016 18:00 Echocardiogram 05/14/2016. ------------------------------------------------------------------- LV EF: 65% -   70%  ------------------------------------------------------------------- Indications:      Dyspnea 786.09.  ------------------------------------------------------------------- History:   PMH:  Chronic kidney disease. Bipolar disease. History of alcohol use.  Risk factors:  Current tobacco use. Hypertension.   ------------------------------------------------------------------- Study Conclusions  - Left ventricle: The cavity size was normal. There was mild focal   basal hypertrophy of the septum. Systolic function was vigorous.   The estimated ejection fraction was in the range of 65% to 70%.   Wall motion was normal; there were no regional wall motion   abnormalities. Doppler parameters are consistent with abnormal   left ventricular relaxation (grade 1 diastolic dysfunction). - Mitral valve: Moderately calcified annulus. - Pericardium, extracardiac: There was a left pleural effusion.  EKG: Independently reviewed. Vent. rate 63 BPM PR interval 142 ms QRS duration 70 ms QT/QTc 410/419 ms P-R-T axes 72 62 58 Normal sinus rhythm Nonspecific ST abnormality Abnormal ECG  Assessment/Plan Principal Problem:   Acute diastolic CHF (congestive heart failure) (HCC) Admit to telemetry/observation. Continue supplemental oxygen. Continue IV furosemide. Hold Carvedilol Daily weights. Intake and output.  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 CBG monitoring with meals. Resume sliding scale in the morning if the  patient does not recurrence of hypoglycemia.      Hypertension Amlodipine 5 mg by mouth daily. Hold carvedilol. Monitor blood pressure closely.    CKD (chronic kidney disease) stage 3, GFR 30-59 ml/min Monitor renal function and electrolytes.    Hypothyroidism  Continue levothyroxine Monitor TSH.    Depression   Anxiety Continue Lexapro 10 mg by mouth daily at bedtime.    HLD (hyperlipidemia) Atorvastatin 10 mg by mouth daily. Monitor LFTs periodically.    Anemia of chronic disease Continue iron and multivitamin supplementation. Monitor H&H.    GERD Continue pantoprazole 40 mg by mouth daily.    DVT prophylaxis: Lovenox. Code Status: Full code. Family Communication:  Disposition Plan: Admit to telemetry, gentle diuresis, trend troponin levels. Consults called:  Admission status: Observation/Telemetry.   Bobette Mo MD [facility] Pager [phone].  If 7PM-7AM, please contact night-coverage [email or web address] Password [facility]  07/11/2016, 10:53 PM

## 2016-07-11 NOTE — ED Provider Notes (Signed)
[facility]-EMERGENCY DEPT Provider Note   CSN: [id number] Arrival date & time: 07/11/16  1644  First Provider Contact:  First MD Initiated Contact with Patient 07/11/16 1948        History   Chief Complaint Chief Complaint  Patient presents with  . Dizziness  . Shortness of Breath    HPI Cassandra [name] is a 70 y.o. female.  The history is provided by the patient.  Dizziness  Quality:  Lightheadedness Severity:  Severe Onset quality:  Sudden Timing:  Intermittent Progression:  Waxing and waning Chronicity:  Recurrent Context: not when bending over, not with head movement, not with loss of consciousness, not with physical activity and not when standing up   Relieved by:  Nothing Worsened by:  Nothing Associated symptoms: shortness of breath and weakness   Associated symptoms: no blood in stool, no chest pain, no diarrhea, no nausea, no palpitations, no vision changes and no vomiting   Risk factors: no new medications     Past Medical History:  Diagnosis Date  . Allergic rhinitis   . Anxiety   . Benign hypertension with CKD (chronic kidney disease) stage III   . Benign paroxysmal positional vertigo   . Bipolar disorder (HCC)   . Broken finger   . Broken shoulder   . Broken toes   . Cervicalgia   . CKD (chronic kidney disease) stage 3, GFR 30-59 ml/min   . Depression   . Diabetes mellitus without complication (HCC)   . Elevated liver enzymes Hep B/C neg 2014  . GERD (gastroesophageal reflux disease)   . Glaucoma   . History of alcohol use   . Hypertension   . Hypothyroidism   . Interstitial cystitis    bladder stretched every 9 months  . Migraines   . Psoriasis   . Stroke (HCC) 05/16/2016   Left occipital and thalamic, right hippocampal  . Thyroid disease   . Tobacco use   . 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 Vitamin B12 deficiency     Patient Active Problem List   Diagnosis Date Noted  . Acute diastolic CHF (congestive heart  failure) (HCC) 07/11/2016  . Cerebrovascular accident (CVA) (HCC)   . Essential hypertension   . HLD (hyperlipidemia)   . Anemia of chronic disease   . Physical deconditioning   . Acute renal failure superimposed on stage 3 chronic kidney disease (HCC)   . Diabetic ketoacidosis with coma associated with type 1 diabetes mellitus (HCC)   . History of ETT   . Stroke (HCC) 05/16/2016  . Cerebral thrombosis with cerebral infarction 05/15/2016  . Acute on chronic renal failure (HCC)   . Generalized anxiety disorder 05/10/2016  . DKA (diabetic ketoacidoses) (HCC) 05/06/2016  . Acute respiratory failure (HCC) 05/06/2016  . Acute respiratory failure with hypoxia (HCC) 05/06/2016  . Acute encephalopathy 05/06/2016  . Septic shock (HCC)   . Acute respiratory failure with hypoxia and hypercapnia (HCC)   . 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 Depression   . Hypertension   . CKD (chronic kidney disease) stage 3, GFR 30-59 ml/min   . Hypothyroidism   . Vitamin B12 deficiency   . Benign paroxysmal positional vertigo   . Anxiety   . Allergic rhinitis   . Glaucoma   . Benign hypertension with CKD (chronic kidney disease) stage III   . Tobacco use   . Cervicalgia   . Elevated liver enzymes   . History  of alcohol use     Past Surgical History:  Procedure Laterality Date  . ABDOMINAL HYSTERECTOMY     with oophorectomy  . APPENDECTOMY    . BLADDER SURGERY    . CATARACT EXTRACTION    . CERVICAL DISC SURGERY    . CHOLECYSTECTOMY    . HERNIA REPAIR    . TONSILLECTOMY      OB History    No data available       Home Medications    Prior to Admission medications   Medication Sig Start Date End Date Taking? Authorizing Provider  amLODipine (NORVASC) 5 MG tablet Take 1 tablet (5 mg total) by mouth daily. 05/19/16  Yes Rolly Salter, MD  carvedilol (COREG) 12.5 MG tablet Take 1 tablet (12.5 mg total) by mouth 2 (two) times daily with a meal. 02/12/16  Yes Chilton Si, MD  aspirin EC 81 MG EC tablet Take 1 tablet (81 mg total) by mouth daily. 05/19/16   Rolly Salter, MD  atorvastatin (LIPITOR) 10 MG tablet Take 1 tablet (10 mg total) by mouth daily at 6 PM. Patient taking differently: Take 10 mg by mouth at bedtime.  05/19/16   Rolly Salter, MD  cetirizine (ZYRTEC) 10 MG tablet Take 10 mg by mouth daily as needed for allergies.    Historical Provider, MD  clopidogrel (PLAVIX) 75 MG tablet Take 1 tablet (75 mg total) by mouth daily. 05/19/16 08/19/16  Rolly Salter, MD  cyanocobalamin (,VITAMIN B-12,) 1000 MCG/ML injection Inject 1,000 mcg into the muscle every 30 (thirty) days. Last injection approx 3rd week of June, 2017    Historical Provider, MD  escitalopram (LEXAPRO) 10 MG tablet Take 1 tablet (10 mg total) by mouth at bedtime. 05/19/16   Rolly Salter, MD  feeding supplement, GLUCERNA SHAKE, (GLUCERNA SHAKE) LIQD Take 237 mLs by mouth 3 (three) times daily between meals. Patient not taking: Reported on 06/22/2016 05/19/16   Rolly Salter, MD  folic acid (FOLVITE) 1 MG tablet Take 1 tablet (1 mg total) by mouth daily. 05/19/16   Rolly Salter, MD  furosemide (LASIX) 20 MG tablet Take 1 tablet (20 mg total) by mouth daily. 05/19/16   Rolly Salter, MD  hydrocortisone (ANUSOL-HC) 2.5 % rectal cream Place rectally 4 (four) times daily. Patient not taking: Reported on 06/22/2016 05/19/16   Rolly Salter, MD  insulin glargine (LANTUS) 100 UNIT/ML injection Inject 0.1 mLs (10 Units total) into the skin daily. 06/28/16   Vassie Loll, MD  insulin lispro (HUMALOG) 100 UNIT/ML injection Inject 0.04 mLs (4 Units total) into the skin 3 (three) times daily with meals. Reported on 06/22/2016 06/28/16   Vassie Loll, MD  iron polysaccharides (NIFEREX) 150 MG capsule Take 1 capsule (150 mg total) by mouth 2 (two) times daily. 06/28/16   Vassie Loll, MD  levothyroxine (SYNTHROID, LEVOTHROID) 75 MCG tablet Take 75 mcg by mouth daily. 05/30/16   Historical Provider, MD  LORazepam  (ATIVAN) 0.5 MG tablet Take 1 tablet (0.5 mg total) by mouth every 8 (eight) hours as needed for anxiety. 05/19/16   Rolly Salter, MD  meclizine (ANTIVERT) 25 MG tablet Take 1 tablet (25 mg total) by mouth 3 (three) times daily as needed for dizziness. Patient taking differently: Take 12.5 mg by mouth 3 (three) times daily as needed for dizziness.  02/12/16   Chilton Si, MD  pantoprazole (PROTONIX) 40 MG tablet Take 1 tablet (40 mg total) by mouth 2 (two)  times daily. 05/19/16   Rolly Salter, MD  polyethylene glycol (MIRALAX / Ethelene Hal) packet Take 17 g by mouth every other day. Patient not taking: Reported on 06/22/2016 05/19/16   Rolly Salter, MD  saccharomyces boulardii (FLORASTOR) 250 MG capsule Take 1 capsule (250 mg total) by mouth 2 (two) times daily. 05/19/16   Rolly Salter, MD  sodium chloride (MURO 128) 5 % ophthalmic solution Place 1 drop into both eyes daily.    Historical Provider, MD  thiamine 100 MG tablet Take 1 tablet (100 mg total) by mouth daily. 05/19/16   Rolly Salter, MD    Family History Family History  Problem Relation Age of Onset  . Alcohol abuse Mother   . Arthritis Mother   . Asthma Mother   . Cancer Mother     colon cancer  . Hypertension Mother   . Migraines Mother   . Stroke Mother   . Lung disease Mother   . COPD Mother   . Diabetes Father   . Hypertension Father   . Heart disease Father   . Heart attack Father   . Heart disease Paternal Grandmother   . Diabetes Paternal Grandmother   . Stroke Paternal Grandmother   . Cancer Paternal Grandmother   . Diabetes Paternal Grandfather     Social History Social History  Substance Use Topics  . Smoking status: Current Every Day Smoker    Packs/day: 0.25    Types: Cigarettes  . Smokeless tobacco: Never Used  . Alcohol use No     Comment: Hasn't had any alcohol since last admission     Allergies   Ciprofloxacin; Codeine; Doxycycline; Hydrocodone; and Omnicef [cefdinir]   Review of  Systems Review of Systems  Constitutional: Negative for chills and fever.  HENT: Negative for ear pain and sore throat.   Eyes: Negative for pain and visual disturbance.  Respiratory: Positive for shortness of breath. Negative for cough.   Cardiovascular: Positive for leg swelling. Negative for chest pain and palpitations.  Gastrointestinal: Negative for abdominal pain, blood in stool, diarrhea, nausea and vomiting.  Genitourinary: Negative for dysuria and hematuria.  Musculoskeletal: Negative for arthralgias and back pain.  Skin: Negative for color change and rash.  Neurological: Positive for dizziness and weakness. Negative for seizures and syncope.  All other systems reviewed and are negative.    Physical Exam Updated Vital Signs BP 127/60   Pulse 70   Temp 97.7 F (36.5 C) (Oral)   Resp 11   Ht 5\' 2"  (1.575 m)   Wt 48.2 kg   SpO2 98%   BMI 19.42 kg/m   Physical Exam  Constitutional: She appears well-developed and well-nourished. No distress.  HENT:  Head: Normocephalic and atraumatic.  Eyes: Conjunctivae are normal.  Neck: Neck supple.  Cardiovascular: Normal rate and regular rhythm.   No murmur heard. Pulmonary/Chest: Effort normal. No respiratory distress. She has rales in the right lower field and the left lower field.  Abdominal: Soft. There is no tenderness.  Musculoskeletal: She exhibits edema (plus 2 pretibial bilaterally.).  Neurological: She is alert. She has normal strength. No cranial nerve deficit or sensory deficit. GCS eye subscore is 4. GCS verbal subscore is 5. GCS motor subscore is 6.  Normal finger to nose bilaterally.   No pronator drift bilaterally.    Skin: Skin is warm and dry.  Psychiatric: She has a normal mood and affect.  Nursing note and vitals reviewed.    ED Treatments / Results  Labs (all labs ordered are listed, but only abnormal results are displayed) Labs Reviewed  CBC - Abnormal; Notable for the following:       Result Value    WBC 10.7 (*)    RBC 3.04 (*)    Hemoglobin 9.4 (*)    HCT 27.7 (*)    All other components within normal limits  COMPREHENSIVE METABOLIC PANEL - Abnormal; Notable for the following:    Glucose, Bld 125 (*)    BUN 21 (*)    Creatinine, Ser 1.40 (*)    Total Protein 6.2 (*)    GFR calc non Af Amer 38 (*)    GFR calc Af Amer 44 (*)    All other components within normal limits  BRAIN NATRIURETIC PEPTIDE - Abnormal; Notable for the following:    B Natriuretic Peptide 469.1 (*)    All other components within normal limits  URINALYSIS, ROUTINE W REFLEX MICROSCOPIC (NOT AT [facility]) - Abnormal; Notable for the following:    Protein, ur 100 (*)    All other components within normal limits  URINE MICROSCOPIC-ADD ON - Abnormal; Notable for the following:    Squamous Epithelial / LPF 0-5 (*)    Bacteria, UA RARE (*)    Casts HYALINE CASTS (*)    All other components within normal limits  CBG MONITORING, ED - Abnormal; Notable for the following:    Glucose-Capillary 140 (*)    All other components within normal limits  I-STAT CHEM 8, ED - Abnormal; Notable for the following:    BUN 24 (*)    Creatinine, Ser 1.30 (*)    Glucose, Bld 121 (*)    Hemoglobin 9.9 (*)    HCT 29.0 (*)    All other components within normal limits  CBG MONITORING, ED - Abnormal; Notable for the following:    Glucose-Capillary 32 (*)    All other components within normal limits  CBG MONITORING, ED - Abnormal; Notable for the following:    Glucose-Capillary 64 (*)    All other components within normal limits  CBG MONITORING, ED - Abnormal; Notable for the following:    Glucose-Capillary 247 (*)    All other components within normal limits  PROTIME-INR  APTT  DIFFERENTIAL  I-STAT TROPOININ, ED    EKG  EKG Interpretation  Date/Time:  Thursday July 11 2016 17:11:14 EDT Ventricular Rate:  63 PR Interval:  142 QRS Duration: 70 QT Interval:  410 QTC Calculation: 419 R Axis:   62 Text Interpretation:   Normal sinus rhythm Nonspecific ST abnormality Abnormal ECG Confirmed by Juleen China  MD, STEPHEN ([id number]) on 07/11/2016 7:25:39 PM       Radiology Dg Chest 2 View  Result Date: 07/11/2016 CLINICAL DATA:  Bilateral lower extremity swelling for the past 2 days. EXAM: CHEST  2 VIEW COMPARISON:  06/22/2016; 05/10/2016; chest CT - 05/04/2016 FINDINGS: Grossly unchanged borderline enlarged cardiac silhouette. Normal mediastinal contours. Atherosclerotic plaque when the thoracic aorta. The lungs appear hyperexpanded with flattening of the bilateral hemidiaphragms mild diffuse slightly nodular thickening of the pulmonary interstitium. Interval development of a trace right-sided effusion with associated right basilar heterogeneous opacities. No definite evidence of edema. No acute osseus abnormalities. Post lower cervical ACDF, incompletely evaluated. IMPRESSION: 1. Interval development of a trace right-sided effusion with associated right basilar opacities, atelectasis versus infiltrate. A follow-up chest radiograph in 3 to 4 weeks after treatment is recommended to ensure resolution. 2. Cardiomegaly without evidence of edema. 3.  Emphysema. (ICD10-J43.9) 4.  Aortic Atherosclerosis (ICD10-170.0) Electronically Signed   By: Simonne Come M.D.   On: 07/11/2016 21:23  Ct Head Wo Contrast  Result Date: 07/11/2016 CLINICAL DATA:  Weakness with dizziness and blurred vision. Headache. EXAM: CT HEAD WITHOUT CONTRAST TECHNIQUE: Contiguous axial images were obtained from the base of the skull through the vertex without intravenous contrast. COMPARISON:  06/22/2016. FINDINGS: There is no evidence for acute hemorrhage, hydrocephalus, mass lesion, or abnormal extra-axial fluid collection. No definite CT evidence for acute infarction. Diffuse loss of parenchymal volume is consistent with atrophy. Patchy low attenuation in the deep hemispheric and periventricular white matter is nonspecific, but likely reflects chronic microvascular  ischemic demyelination. The visualized paranasal sinuses and mastoid air cells are clear. IMPRESSION: Stable.  No acute intracranial abnormality. Atrophy with chronic small vessel white matter ischemic disease. Electronically Signed   By: Kennith Center M.D.   On: 07/11/2016 18:00   Procedures Procedures (including critical care time)  Medications Ordered in ED Medications  dextrose 50 % solution 50 mL (50 mLs Intravenous Given 07/11/16 2050)  furosemide (LASIX) injection 20 mg (20 mg Intravenous Given 07/11/16 2247)     Initial Impression / Assessment and Plan / ED Course  I have reviewed the triage vital signs and the nursing notes.  Pertinent labs & imaging results that were available during my care of the patient were reviewed by me and considered in my medical decision making (see chart for details).  Clinical Course    70 year old female with a history of diabetes, CAD, COPD presenting for intermittent episodes of presyncope. Reports lightheadedness roughly once a week with no aggravating or alleviating factors. Denies any chest pain, palpitations during these events. Over the last several days has had increasing shortness of breath and lower extremity swelling over. When to the PCP today or had presyncopal episode and sent to the emergency department for further evaluation.  Evaluation the patient has bilateral rails with +2 pitting edema of lower shoulders. ECG with no acute ischemic changes. BNP elevated to 500. Neuro exam with no focal deficits and low suspicion for stroke/TIA or SAH at this time.  Blood glucose 232 in the ED. Given juice and sandwich with some alleviation to 64. One amp D50 given. In the 150s.  Consulted hospitalist for possible new onset CHF and high risk presyncopal episodes.   Labs were viewed by myself and incorporated into medical decision making.  Discussed pertinent finding with patient or caregiver prior to admission with no further questions.  Pt care  supervised by my attending Dr. Juleen China.   Tery Sanfilippo, MD PGY-3 Emergency Medicine   Final Clinical Impressions(s) / ED Diagnoses   Final diagnoses:  Near syncope    New Prescriptions New Prescriptions   No medications on file     Tery Sanfilippo, MD 07/11/16 2320    Raeford Razor, MD 07/22/16 1146

## 2016-07-11 NOTE — ED Triage Notes (Signed)
Pt reports not feeling well since yesterday. Was hospitalized 3 weeks ago for CVA. Having dizziness since the stroke but increase in dizziness yesterday. Has blurred vision and sob, swelling to legs. Had to call ems x 2 for hypoglycemia.

## 2016-07-11 NOTE — ED Notes (Signed)
cbg 247 notified nurse

## 2016-07-11 NOTE — ED Notes (Signed)
cbg 64 notified nurse

## 2016-07-12 ENCOUNTER — Observation Stay (HOSPITAL_COMMUNITY): Payer: Medicare Other

## 2016-07-12 ENCOUNTER — Encounter (HOSPITAL_COMMUNITY): Payer: Self-pay | Admitting: Nurse Practitioner

## 2016-07-12 DIAGNOSIS — I6330 Cerebral infarction due to thrombosis of unspecified cerebral artery: Secondary | ICD-10-CM | POA: Diagnosis not present

## 2016-07-12 DIAGNOSIS — I5031 Acute diastolic (congestive) heart failure: Secondary | ICD-10-CM | POA: Diagnosis not present

## 2016-07-12 DIAGNOSIS — E1022 Type 1 diabetes mellitus with diabetic chronic kidney disease: Secondary | ICD-10-CM

## 2016-07-12 DIAGNOSIS — N183 Chronic kidney disease, stage 3 (moderate): Secondary | ICD-10-CM

## 2016-07-12 DIAGNOSIS — I951 Orthostatic hypotension: Secondary | ICD-10-CM | POA: Diagnosis not present

## 2016-07-12 DIAGNOSIS — E039 Hypothyroidism, unspecified: Secondary | ICD-10-CM | POA: Diagnosis not present

## 2016-07-12 DIAGNOSIS — N182 Chronic kidney disease, stage 2 (mild): Secondary | ICD-10-CM | POA: Diagnosis not present

## 2016-07-12 DIAGNOSIS — R55 Syncope and collapse: Secondary | ICD-10-CM | POA: Diagnosis not present

## 2016-07-12 LAB — GLUCOSE, CAPILLARY
GLUCOSE-CAPILLARY: 111 mg/dL — AB (ref 65–99)
Glucose-Capillary: 147 mg/dL — ABNORMAL HIGH (ref 65–99)
Glucose-Capillary: 192 mg/dL — ABNORMAL HIGH (ref 65–99)
Glucose-Capillary: 286 mg/dL — ABNORMAL HIGH (ref 65–99)
Glucose-Capillary: 29 mg/dL — CL (ref 65–99)
Glucose-Capillary: 300 mg/dL — ABNORMAL HIGH (ref 65–99)
Glucose-Capillary: 52 mg/dL — ABNORMAL LOW (ref 65–99)

## 2016-07-12 LAB — TROPONIN I
Troponin I: <0.03 ng/mL (ref <0.03)
Troponin I: <0.03 ng/mL (ref <0.03)

## 2016-07-12 LAB — CBC
HEMATOCRIT: 24.6 % — AB (ref 36.0–46.0)
Hemoglobin: 8.5 g/dL — ABNORMAL LOW (ref 12.0–15.0)
MCH: 31.3 pg (ref 26.0–34.0)
MCHC: 34.6 g/dL (ref 30.0–36.0)
MCV: 90.4 fL (ref 78.0–100.0)
PLATELETS: 218 10*3/uL (ref 150–400)
RBC: 2.72 MIL/uL — ABNORMAL LOW (ref 3.87–5.11)
RDW: 13.9 % (ref 11.5–15.5)
WBC: 5.8 10*3/uL (ref 4.0–10.5)

## 2016-07-12 LAB — BASIC METABOLIC PANEL
ANION GAP: 10 (ref 5–15)
BUN: 15 mg/dL (ref 6–20)
CALCIUM: 8.5 mg/dL — AB (ref 8.9–10.3)
CO2: 25 mmol/L (ref 22–32)
Chloride: 104 mmol/L (ref 101–111)
Creatinine, Ser: 1.15 mg/dL — ABNORMAL HIGH (ref 0.44–1.00)
GFR calc Af Amer: 55 mL/min — ABNORMAL LOW (ref >60)
GFR, EST NON AFRICAN AMERICAN: 48 mL/min — AB (ref >60)
GLUCOSE: 138 mg/dL — AB (ref 65–99)
POTASSIUM: 3.6 mmol/L (ref 3.5–5.1)
Sodium: 139 mmol/L (ref 135–145)

## 2016-07-12 MED ORDER — INSULIN ASPART 100 UNIT/ML ~~LOC~~ SOLN: 0.0000 [IU] | Freq: Every day | SUBCUTANEOUS | Status: DC

## 2016-07-12 MED ORDER — STROKE: EARLY STAGES OF RECOVERY BOOK
Freq: Once | Status: DC
  Filled 2016-07-12: qty 1

## 2016-07-12 MED ORDER — INSULIN ASPART 100 UNIT/ML ~~LOC~~ SOLN
0.0000 [IU] | Freq: Three times a day (TID) | SUBCUTANEOUS | Status: DC
  Administered 2016-07-12 – 2016-07-13 (×2): 8 [IU] via SUBCUTANEOUS

## 2016-07-12 NOTE — Progress Notes (Signed)
MRI brain shows acute  left thalamic infarct. Patient out of therapeutic window for tPA. Stroke pathway initiated and workup ordered. Will consult neurology.

## 2016-07-12 NOTE — Evaluation (Addendum)
Physical Therapy Evaluation Patient Details Name: Cassandra [name] MRN: [id number] DOB: [birth year] Today's Date: 07/12/2016   History of Present Illness  Patient is a 70 yo female admitted 07/11/16 with dizziness and SOB.  Patient with CHF, and dizziness continued.  Patient developed blurred vision.  MRI showed Lt thalamic infarct.   PMH:  HTN, DM, CKD, anxiety, depression, BPPV, bipolar disorder, mult CVA's    Clinical Impression  Patient presents with problems listed below.  Will benefit from acute PT to maximize functional mobility prior to discharge home with husband.  Patient with imbalance and weakness impacting functional mobility.  Recommend HHPT at d/c to continue therapy.  (Patient currently using [facility] per husband)    Follow Up Recommendations Home health PT;Supervision for mobility/OOB    Equipment Recommendations  None recommended by PT    Recommendations for Other Services OT consult     Precautions / Restrictions Precautions Precautions: Fall Restrictions Weight Bearing Restrictions: No      Mobility  Bed Mobility Overal bed mobility: Modified Independent             General bed mobility comments: Increased time to move supine <> sit.  Good sitting balance once upright.  BP 147/90 supine and 160/80 in sitting.  Transfers Overall transfer level: Needs assistance Equipment used: None Transfers: Sit to/from Stand Sit to Stand: Min guard         General transfer comment: Patient able to move to standing with min guard assist for safety.  Patient able to step in place 5 steps each foot.  Patient feeling "off balance" (no spinning) and requested to return to sitting.  Fatigued quickly.  Ambulation/Gait             General Gait Details: Declined  Stairs            Wheelchair Mobility    Modified Rankin (Stroke Patients Only) Modified Rankin (Stroke Patients Only) Pre-Morbid Rankin Score: Slight disability Modified Rankin: Moderately  severe disability     Balance Overall balance assessment: Needs assistance Sitting-balance support: No upper extremity supported;Feet supported Sitting balance-Leahy Scale: Good     Standing balance support: No upper extremity supported Standing balance-Leahy Scale: Fair Standing balance comment: Able to maintain static standing balance without support.  Patient limited by feeling off balance and weak.                             Pertinent Vitals/Pain Pain Assessment: No/denies pain    Home Living Family/patient expects to be discharged to:: Private residence Living Arrangements: Spouse/significant other Available Help at Discharge: Family;Available 24 hours/day Type of Home: Apartment Home Access: Level entry     Home Layout: One level Home Equipment: Walker - 2 wheels;Shower seat Additional Comments: Husband does all cooking, cleaning and laundry as well as grocery shopping.     Prior Function Level of Independence: Independent         Comments: Does not drive.  Does not use RW.  Working with HHPT for balance.     Hand Dominance   Dominant Hand: Right    Extremity/Trunk Assessment   Upper Extremity Assessment: Overall WFL for tasks assessed           Lower Extremity Assessment: Generalized weakness         Communication   Communication: No difficulties  Cognition Arousal/Alertness: Awake/alert Behavior During Therapy: WFL for tasks assessed/performed;Flat affect Overall Cognitive Status: Within Functional Limits for  tasks assessed                      General Comments      Exercises        Assessment/Plan    PT Assessment Patient needs continued PT services  PT Diagnosis Difficulty walking;Generalized weakness   PT Problem List Decreased strength;Decreased activity tolerance;Decreased balance;Decreased mobility;Decreased knowledge of use of DME  PT Treatment Interventions DME instruction;Gait training;Functional  mobility training;Therapeutic activities;Therapeutic exercise;Balance training;Patient/family education   PT Goals (Current goals can be found in the Care Plan section) Acute Rehab PT Goals Patient Stated Goal: To go home PT Goal Formulation: With patient/family Time For Goal Achievement: 07/19/16 Potential to Achieve Goals: Good    Frequency Min 4X/week   Barriers to discharge        Co-evaluation               End of Session Equipment Utilized During Treatment: Gait belt Activity Tolerance: Patient limited by fatigue Patient left: in bed;with call bell/phone within reach;with nursing/sitter in room;with family/visitor present Nurse Communication: Mobility status (BP in sitting 160/80)    Functional Assessment Tool Used: Clinical judgement Functional Limitation: Mobility: Walking and moving around Mobility: Walking and Moving Around Current Status [phone]): At least 40 percent but less than 60 percent impaired, limited or restricted Mobility: Walking and Moving Around Goal Status [phone]): At least 1 percent but less than 20 percent impaired, limited or restricted    Time: 1719-1741 PT Time Calculation (min) (ACUTE ONLY): 22 min   Charges:   PT Evaluation $PT Eval Moderate Complexity: 1 Procedure     PT G Codes:   PT G-Codes **NOT FOR INPATIENT CLASS** Functional Assessment Tool Used: Clinical judgement Functional Limitation: Mobility: Walking and moving around Mobility: Walking and Moving Around Current Status [id number]): At least 40 percent but less than 60 percent impaired, limited or restricted Mobility: Walking and Moving Around Goal Status [phone]): At least 1 percent but less than 20 percent impaired, limited or restricted    Vena Austria 07/12/2016, 5:58 PM Durenda Hurt. Renaldo Fiddler, [facility] Acute Rehab Services Pager [phone]

## 2016-07-12 NOTE — Care Management Obs Status (Signed)
MEDICARE OBSERVATION STATUS NOTIFICATION   Patient Details  Name: Cassandra [name] MRN: [id number] Date of Birth: 1946-03-18   Medicare Observation Status Notification Given:  Yes    Cherrie Distance, RN 07/12/2016, 2:59 PM

## 2016-07-12 NOTE — Progress Notes (Signed)
Patient transferred from 3 E. Patient alert and oriented x 4. Patient oriented to unit and made comfortable. Tele placed and was verified. Will continue to monitor.

## 2016-07-12 NOTE — Progress Notes (Signed)
CM following for DCP; pt to be transferred to the Neuro floor for ongoing workup. Abelino Derrick [facility] [phone]

## 2016-07-12 NOTE — Progress Notes (Signed)
Patient has order to transfer to 64M.  Report given to RN. Patient transferred in no distress. 26m staff at bedside.

## 2016-07-12 NOTE — Progress Notes (Signed)
*  Preliminary Results* There is no obvious evidence of hemodynamically significant internal carotid artery stenosis bilaterally. Vertebral arteries are patent with antegrade flow.  07/12/2016 5:58 PM  Gertie Fey, B.S., RVT, RDCS, RDMS

## 2016-07-12 NOTE — Progress Notes (Signed)
PROGRESS NOTE                                                                                                                                                                                                             Patient Demographics:    Cassandra [name], is a 70 y.o. female, DOB - [birth year], FTD:[id number]  Admit date - 07/11/2016   Admitting Physician Bobette Mo, MD  Outpatient Primary MD for the patient is Irving Copas, MD  LOS - 0  Outpatient Specialists:None  Chief Complaint  Patient presents with  . Dizziness  . Shortness of Breath       Brief Narrative  70 year old female with history of anxiety, bipolar disorder, depression, since hypertension, type 2 diabetes mellitus, (admitted recently with DKA) hypothyroidism, BPPV, GERD, history of CVA, history of alcohol abuse, tobacco use and B12 deficiency presented to the ED with dizziness and blurred vision since one day which is associated with some dyspnea and weakness and increased pitting edema of the legs. Patient first went to her PCP office and complained of feeling dizzy when trying to ambulate and also had some blurred vision. She describes her symptoms to be similar to her BPPV which usually gets better shortly after taking meclizine or sitting down. However the symptoms were prolonged and blurry vision was new. She felt like she was going to fall and had unsteady gait as well. Patient found to be in mild diastolic CHF and admitted to hospitalist service.   Subjective:   She still feels dizzy with occasional blurred vision. Dyspnea better.    Assessment  & Plan :    Principal Problem:   Acute diastolic CHF (congestive heart failure) (HCC) Mild symptoms. Sent 2-D echo with normal EF and grade 1 diastolic dysfunction. Placed on low-dose IV Lasix twice a day. Monitor strict I/O and daily weight. Continue aspirin and statin.   Active  Problems: Dizziness with near syncope She reports her symptoms are different from her position of vertigo as the lasting longer and with near syncopal episode and blurred vision. Urine history of stroke I will obtain an MRI of her brain. Head CT on admission was unremarkable. Check orthostasis. PT evaluation.    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 Continue home dose Lantus with sliding scale coverage.  CKD (chronic kidney disease) stage 3, GFR 30-59 ml/min Renal function at baseline.    Hypothyroidism Continue Synthroid.  BPPV Continue meclizine.   History of stroke Continue Plavix and statin.  Anxiety Continue home dose Xanax.    Code Status : Full code  Family Communication  : None at bedside  Disposition Plan  : Pending clinical improvement. Home once stable. PT evaluation  Barriers For Discharge : Workup pending  Consults  :  None  Procedures  :  Head CT MRI brain  DVT Prophylaxis  :  Lovenox   Lab Results  Component Value Date   PLT 218 07/12/2016    Antibiotics  :    Anti-infectives    None        Objective:   Vitals:   07/11/16 2230 07/11/16 2245 07/11/16 2351 07/12/16 0600  BP: 135/73 127/60 (!) 142/71 (!) 143/69  Pulse: 70 70 69 74  Resp: 15 11 18 18   Temp:   98.5 F (36.9 C) 99.3 F (37.4 C)  TempSrc:   Oral Oral  SpO2: 97% 98% 99% 97%  Weight:   47.8 kg (105 lb 4.8 oz) 46.6 kg (102 lb 11.2 oz)  Height:   5\' 2"  (1.575 m)     Wt Readings from Last 3 Encounters:  07/12/16 46.6 kg (102 lb 11.2 oz)  06/29/16 52.3 kg (115 lb 6.4 oz)  05/19/16 53.4 kg (117 lb 11.6 oz)     Intake/Output Summary (Last 24 hours) at 07/12/16 1153 Last data filed at 07/12/16 0850  Gross per 24 hour  Intake                0 ml  Output              550 ml  Net             -550 ml     Physical Exam  Gen: Appears tired HEENT: no pallor, moist mucosa, supple neck Chest: Clear  bilaterally CVS: N S1&S2, no murmurs, rubs or gallop GI: soft, NT, ND, BS+ Musculoskeletal: warm, 1+ pitting edema CNS: Alert and oriented, nonfocal    Data Review:    CBC  Recent Labs Lab 07/11/16 1600 07/11/16 1720 07/12/16 0650  WBC 10.7*  --  5.8  HGB 9.4* 9.9* 8.5*  HCT 27.7* 29.0* 24.6*  PLT 297  --  218  MCV 91.1  --  90.4  MCH 30.9  --  31.3  MCHC 33.9  --  34.6  RDW 13.6  --  13.9  LYMPHSABS 2.3  --   --   MONOABS 0.5  --   --   EOSABS 0.4  --   --   BASOSABS 0.1  --   --     Chemistries   Recent Labs Lab 07/11/16 1600 07/11/16 1720 07/12/16 0650  NA 138 139 139  K 4.5 4.6 3.6  CL 106 107 104  CO2 23  --  25  GLUCOSE 125* 121* 138*  BUN 21* 24* 15  CREATININE 1.40* 1.30* 1.15*  CALCIUM 9.3  --  8.5*  AST 20  --   --   ALT 14  --   --   ALKPHOS 64  --   --   BILITOT 0.4  --   --    ------------------------------------------------------------------------------------------------------------------ No results for input(s): CHOL, HDL, LDLCALC, TRIG, CHOLHDL, LDLDIRECT in the last 72 hours.  Lab Results  Component Value Date   HGBA1C 7.3 (H) 05/17/2016   ------------------------------------------------------------------------------------------------------------------  No results for input(s): TSH, T4TOTAL, T3FREE, THYROIDAB in the last 72 hours.  Invalid input(s): FREET3 ------------------------------------------------------------------------------------------------------------------ No results for input(s): VITAMINB12, FOLATE, FERRITIN, TIBC, IRON, RETICCTPCT in the last 72 hours.  Coagulation profile  Recent Labs Lab 07/11/16 1600  INR 0.90    No results for input(s): DDIMER in the last 72 hours.  Cardiac Enzymes  Recent Labs Lab 07/12/16 0156 07/12/16 0650  TROPONINI <0.03 <0.03   ------------------------------------------------------------------------------------------------------------------    Component Value Date/Time   BNP  469.1 (H) 07/11/2016 1600    Inpatient Medications  Scheduled Meds: . amLODipine  5 mg Oral Daily  . aspirin EC  81 mg Oral Daily  . atorvastatin  10 mg Oral QHS  . clopidogrel  75 mg Oral Daily  . cyanocobalamin  1,000 mcg Intramuscular Q30 days  . enoxaparin (LOVENOX) injection  40 mg Subcutaneous QHS  . escitalopram  10 mg Oral QHS  . feeding supplement (GLUCERNA SHAKE)  237 mL Oral TID BM  . folic acid  1 mg Oral Daily  . furosemide  20 mg Intravenous BID  . insulin glargine  10 Units Subcutaneous Daily  . iron polysaccharides  150 mg Oral BID  . levothyroxine  75 mcg Oral QAC breakfast  . loratadine  10 mg Oral Daily  . pantoprazole  40 mg Oral BID  . polyethylene glycol  17 g Oral QODAY  . saccharomyces boulardii  250 mg Oral BID  . sodium chloride  1 drop Both Eyes Daily  . sodium chloride flush  3 mL Intravenous Q12H  . sodium chloride flush  3 mL Intravenous Q12H  . thiamine  100 mg Oral Daily   Continuous Infusions:  PRN Meds:.sodium chloride, LORazepam, meclizine, sodium chloride flush  Micro Results No results found for this or any previous visit (from the past 240 hour(s)).  Radiology Reports Dg Chest 2 View  Result Date: 07/11/2016 CLINICAL DATA:  Bilateral lower extremity swelling for the past 2 days. EXAM: CHEST  2 VIEW COMPARISON:  06/22/2016; 05/10/2016; chest CT - 05/04/2016 FINDINGS: Grossly unchanged borderline enlarged cardiac silhouette. Normal mediastinal contours. Atherosclerotic plaque when the thoracic aorta. The lungs appear hyperexpanded with flattening of the bilateral hemidiaphragms mild diffuse slightly nodular thickening of the pulmonary interstitium. Interval development of a trace right-sided effusion with associated right basilar heterogeneous opacities. No definite evidence of edema. No acute osseus abnormalities. Post lower cervical ACDF, incompletely evaluated. IMPRESSION: 1. Interval development of a trace right-sided effusion with  associated right basilar opacities, atelectasis versus infiltrate. A follow-up chest radiograph in 3 to 4 weeks after treatment is recommended to ensure resolution. 2. Cardiomegaly without evidence of edema. 3.  Emphysema. (ICD10-J43.9) 4.  Aortic Atherosclerosis (ICD10-170.0) Electronically Signed   By: Simonne Come M.D.   On: 07/11/2016 21:23  Ct Head Wo Contrast  Result Date: 07/11/2016 CLINICAL DATA:  Weakness with dizziness and blurred vision. Headache. EXAM: CT HEAD WITHOUT CONTRAST TECHNIQUE: Contiguous axial images were obtained from the base of the skull through the vertex without intravenous contrast. COMPARISON:  06/22/2016. FINDINGS: There is no evidence for acute hemorrhage, hydrocephalus, mass lesion, or abnormal extra-axial fluid collection. No definite CT evidence for acute infarction. Diffuse loss of parenchymal volume is consistent with atrophy. Patchy low attenuation in the deep hemispheric and periventricular white matter is nonspecific, but likely reflects chronic microvascular ischemic demyelination. The visualized paranasal sinuses and mastoid air cells are clear. IMPRESSION: Stable.  No acute intracranial abnormality. Atrophy with chronic small vessel white matter ischemic disease.  Electronically Signed   By: Kennith Center M.D.   On: 07/11/2016 18:00  Ct Head Wo Contrast  Result Date: 06/22/2016 CLINICAL DATA:  70 year old female with acute encephalopathy. EXAM: CT HEAD WITHOUT CONTRAST TECHNIQUE: Contiguous axial images were obtained from the base of the skull through the vertex without intravenous contrast. COMPARISON:  Head CT 05/06/2016. FINDINGS: Patchy and confluent areas of decreased attenuation are noted throughout the deep and periventricular white matter of the cerebral hemispheres bilaterally, compatible with chronic microvascular ischemic disease. No acute intracranial abnormalities. Specifically, no evidence of acute intracranial hemorrhage, no definite findings of  acute/subacute cerebral ischemia, no mass, mass effect, hydrocephalus or abnormal intra or extra-axial fluid collections. Visualized paranasal sinuses and mastoids are well pneumatized. No acute displaced skull fractures are identified. IMPRESSION: 1. No acute intracranial abnormalities. 2. Chronic microvascular ischemic changes in cerebral white matter redemonstrated, as above. Electronically Signed   By: Trudie Reed M.D.   On: 06/22/2016 22:30   Dg Chest Portable 1 View  Result Date: 06/22/2016 CLINICAL DATA:  Cough.  History of pneumonia.  Smoker. EXAM: PORTABLE CHEST 1 VIEW COMPARISON:  05/10/2016 chest radiograph. FINDINGS: Surgical hardware from ACDF overlies the lower cervical spine. Stable cardiomediastinal silhouette with normal heart size. No pneumothorax. No pleural effusion. No pulmonary edema. No acute consolidative airspace disease. IMPRESSION: No active disease. Electronically Signed   By: Delbert Phenix M.D.   On: 06/22/2016 15:40    Time Spent in minutes  25   Eddie North M.D on 07/12/2016 at 11:53 AM  Between 7am to 7pm - Pager - [phone]  After 7pm go to [email or web address] - password [facility]  [facility] -  Office  [phone]

## 2016-07-13 DIAGNOSIS — I63432 Cerebral infarction due to embolism of left posterior cerebral artery: Secondary | ICD-10-CM | POA: Diagnosis not present

## 2016-07-13 DIAGNOSIS — N179 Acute kidney failure, unspecified: Secondary | ICD-10-CM | POA: Diagnosis not present

## 2016-07-13 DIAGNOSIS — F329 Major depressive disorder, single episode, unspecified: Secondary | ICD-10-CM | POA: Diagnosis not present

## 2016-07-13 DIAGNOSIS — I5031 Acute diastolic (congestive) heart failure: Secondary | ICD-10-CM | POA: Diagnosis not present

## 2016-07-13 DIAGNOSIS — I739 Peripheral vascular disease, unspecified: Secondary | ICD-10-CM | POA: Diagnosis present

## 2016-07-13 DIAGNOSIS — I130 Hypertensive heart and chronic kidney disease with heart failure and stage 1 through stage 4 chronic kidney disease, or unspecified chronic kidney disease: Secondary | ICD-10-CM | POA: Diagnosis present

## 2016-07-13 DIAGNOSIS — E1022 Type 1 diabetes mellitus with diabetic chronic kidney disease: Secondary | ICD-10-CM | POA: Diagnosis not present

## 2016-07-13 DIAGNOSIS — E785 Hyperlipidemia, unspecified: Secondary | ICD-10-CM | POA: Diagnosis present

## 2016-07-13 DIAGNOSIS — E10649 Type 1 diabetes mellitus with hypoglycemia without coma: Secondary | ICD-10-CM

## 2016-07-13 DIAGNOSIS — Z8673 Personal history of transient ischemic attack (TIA), and cerebral infarction without residual deficits: Secondary | ICD-10-CM | POA: Diagnosis not present

## 2016-07-13 DIAGNOSIS — R42 Dizziness and giddiness: Secondary | ICD-10-CM | POA: Diagnosis present

## 2016-07-13 DIAGNOSIS — I638 Other cerebral infarction: Secondary | ICD-10-CM | POA: Diagnosis present

## 2016-07-13 DIAGNOSIS — K219 Gastro-esophageal reflux disease without esophagitis: Secondary | ICD-10-CM | POA: Diagnosis present

## 2016-07-13 DIAGNOSIS — E860 Dehydration: Secondary | ICD-10-CM | POA: Diagnosis present

## 2016-07-13 DIAGNOSIS — R55 Syncope and collapse: Secondary | ICD-10-CM | POA: Diagnosis not present

## 2016-07-13 DIAGNOSIS — E039 Hypothyroidism, unspecified: Secondary | ICD-10-CM

## 2016-07-13 DIAGNOSIS — F418 Other specified anxiety disorders: Secondary | ICD-10-CM | POA: Diagnosis present

## 2016-07-13 DIAGNOSIS — E1010 Type 1 diabetes mellitus with ketoacidosis without coma: Secondary | ICD-10-CM | POA: Diagnosis present

## 2016-07-13 DIAGNOSIS — I6340 Cerebral infarction due to embolism of unspecified cerebral artery: Secondary | ICD-10-CM | POA: Insufficient documentation

## 2016-07-13 DIAGNOSIS — Z794 Long term (current) use of insulin: Secondary | ICD-10-CM | POA: Diagnosis not present

## 2016-07-13 DIAGNOSIS — R29700 NIHSS score 0: Secondary | ICD-10-CM | POA: Diagnosis present

## 2016-07-13 DIAGNOSIS — I6789 Other cerebrovascular disease: Secondary | ICD-10-CM | POA: Diagnosis not present

## 2016-07-13 DIAGNOSIS — G43909 Migraine, unspecified, not intractable, without status migrainosus: Secondary | ICD-10-CM | POA: Diagnosis present

## 2016-07-13 DIAGNOSIS — N182 Chronic kidney disease, stage 2 (mild): Secondary | ICD-10-CM | POA: Diagnosis not present

## 2016-07-13 DIAGNOSIS — E11649 Type 2 diabetes mellitus with hypoglycemia without coma: Secondary | ICD-10-CM | POA: Diagnosis not present

## 2016-07-13 DIAGNOSIS — I951 Orthostatic hypotension: Secondary | ICD-10-CM | POA: Diagnosis not present

## 2016-07-13 DIAGNOSIS — D638 Anemia in other chronic diseases classified elsewhere: Secondary | ICD-10-CM | POA: Diagnosis present

## 2016-07-13 DIAGNOSIS — Z79899 Other long term (current) drug therapy: Secondary | ICD-10-CM | POA: Diagnosis not present

## 2016-07-13 DIAGNOSIS — I6349 Cerebral infarction due to embolism of other cerebral artery: Secondary | ICD-10-CM | POA: Diagnosis not present

## 2016-07-13 DIAGNOSIS — F17210 Nicotine dependence, cigarettes, uncomplicated: Secondary | ICD-10-CM | POA: Diagnosis present

## 2016-07-13 DIAGNOSIS — N183 Chronic kidney disease, stage 3 (moderate): Secondary | ICD-10-CM | POA: Diagnosis not present

## 2016-07-13 DIAGNOSIS — T502X5A Adverse effect of carbonic-anhydrase inhibitors, benzothiadiazides and other diuretics, initial encounter: Secondary | ICD-10-CM | POA: Diagnosis present

## 2016-07-13 DIAGNOSIS — I6330 Cerebral infarction due to thrombosis of unspecified cerebral artery: Secondary | ICD-10-CM | POA: Diagnosis not present

## 2016-07-13 DIAGNOSIS — Z7982 Long term (current) use of aspirin: Secondary | ICD-10-CM | POA: Diagnosis not present

## 2016-07-13 DIAGNOSIS — F319 Bipolar disorder, unspecified: Secondary | ICD-10-CM | POA: Diagnosis present

## 2016-07-13 LAB — LIPID PANEL
CHOLESTEROL: 185 mg/dL (ref 0–200)
HDL: 64 mg/dL (ref >40)
LDL Cholesterol: 45 mg/dL (ref 0–99)
TRIGLYCERIDES: 378 mg/dL — AB (ref <150)
Total CHOL/HDL Ratio: 2.9 RATIO
VLDL: 76 mg/dL — AB (ref 0–40)

## 2016-07-13 LAB — BASIC METABOLIC PANEL
ANION GAP: 5 (ref 5–15)
BUN: 15 mg/dL (ref 6–20)
CHLORIDE: 103 mmol/L (ref 101–111)
CO2: 27 mmol/L (ref 22–32)
CREATININE: 1.25 mg/dL — AB (ref 0.44–1.00)
Calcium: 8.1 mg/dL — ABNORMAL LOW (ref 8.9–10.3)
GFR calc non Af Amer: 43 mL/min — ABNORMAL LOW (ref >60)
GFR, EST AFRICAN AMERICAN: 50 mL/min — AB (ref >60)
Glucose, Bld: 273 mg/dL — ABNORMAL HIGH (ref 65–99)
POTASSIUM: 4.4 mmol/L (ref 3.5–5.1)
SODIUM: 135 mmol/L (ref 135–145)

## 2016-07-13 LAB — GLUCOSE, CAPILLARY
GLUCOSE-CAPILLARY: 279 mg/dL — AB (ref 65–99)
GLUCOSE-CAPILLARY: 321 mg/dL — AB (ref 65–99)
GLUCOSE-CAPILLARY: 378 mg/dL — AB (ref 65–99)
GLUCOSE-CAPILLARY: 42 mg/dL — AB (ref 65–99)
GLUCOSE-CAPILLARY: 74 mg/dL (ref 65–99)
Glucose-Capillary: 285 mg/dL — ABNORMAL HIGH (ref 65–99)
Glucose-Capillary: 53 mg/dL — ABNORMAL LOW (ref 65–99)
Glucose-Capillary: 57 mg/dL — ABNORMAL LOW (ref 65–99)
Glucose-Capillary: 63 mg/dL — ABNORMAL LOW (ref 65–99)

## 2016-07-13 MED ORDER — INSULIN DETEMIR 100 UNIT/ML ~~LOC~~ SOLN
7.0000 [IU] | Freq: Every day | SUBCUTANEOUS | Status: DC
  Administered 2016-07-13: 7 [IU] via SUBCUTANEOUS
  Filled 2016-07-13: qty 0.07

## 2016-07-13 MED ORDER — INSULIN ASPART 100 UNIT/ML ~~LOC~~ SOLN
0.0000 [IU] | Freq: Three times a day (TID) | SUBCUTANEOUS | Status: DC
  Administered 2016-07-13: 9 [IU] via SUBCUTANEOUS
  Administered 2016-07-13: 7 [IU] via SUBCUTANEOUS
  Administered 2016-07-14: 2 [IU] via SUBCUTANEOUS
  Administered 2016-07-14: 7 [IU] via SUBCUTANEOUS
  Administered 2016-07-15: 3 [IU] via SUBCUTANEOUS

## 2016-07-13 MED ORDER — ACETAMINOPHEN 325 MG PO TABS
650.0000 mg | ORAL_TABLET | Freq: Four times a day (QID) | ORAL | Status: DC | PRN
  Administered 2016-07-13 (×2): 650 mg via ORAL
  Filled 2016-07-13 (×2): qty 2

## 2016-07-13 MED ORDER — ONDANSETRON HCL 4 MG/2ML IJ SOLN
4.0000 mg | Freq: Four times a day (QID) | INTRAMUSCULAR | Status: DC | PRN
  Administered 2016-07-13 – 2016-07-16 (×2): 4 mg via INTRAVENOUS
  Filled 2016-07-13 (×2): qty 2

## 2016-07-13 MED ORDER — ALUM & MAG HYDROXIDE-SIMETH 200-200-20 MG/5ML PO SUSP
30.0000 mL | ORAL | Status: DC | PRN
  Filled 2016-07-13: qty 30

## 2016-07-13 MED ORDER — DEXTROSE 50 % IV SOLN
INTRAVENOUS | Status: AC
  Administered 2016-07-13: 10 mL
  Filled 2016-07-13: qty 50

## 2016-07-13 NOTE — Progress Notes (Signed)
Pt had a hypoglycemic event...the patient was given 4 oz of orange juice, followed by graham crackers and peanut butter.Marland KitchenMarland KitchenOn recheck pt's cbg was still in 60's, however, pt refused to eat or drink anything else.  Pt was given 10 grams of dextrolse iv.  Will recheck cbg in 15 mins and continue to monitor the patient.

## 2016-07-13 NOTE — Progress Notes (Signed)
Physical Therapy Treatment Patient Details Name: Cassandra [name] MRN: [id number] DOB: 1945-12-20 Today's Date: 07/13/2016    History of Present Illness Patient is a 70 yo female admitted 07/11/16 with dizziness and SOB.  Patient with CHF, and dizziness continued.  Patient developed blurred vision.  MRI showed Lt thalamic infarct.   PMH:  HTN, DM, CKD, anxiety, depression, BPPV, bipolar disorder, mult CVA's    PT Comments    Patient making improvement with mobility and gait.  However patient reports lightheadedness, weakness, and headache during gait.  RN notified.  BP 137/80.  Will continue therapy.  Follow Up Recommendations  Home health PT;Supervision for mobility/OOB     Equipment Recommendations  None recommended by PT    Recommendations for Other Services OT consult     Precautions / Restrictions Precautions Precautions: Fall Restrictions Weight Bearing Restrictions: No    Mobility  Bed Mobility Overal bed mobility: Modified Independent             General bed mobility comments: Increased time  Transfers Overall transfer level: Needs assistance Equipment used: None Transfers: Sit to/from Stand Sit to Stand: Min assist         General transfer comment: Patient initiated stance without assist.  Patient reminded to have someone with her to stand.  Patient slightly unsteady in stance today, requiring min assist.  Patient attempting to don robe - unaware of pulling on IV.  Offered assistance.  Ambulation/Gait Ambulation/Gait assistance: Min assist Ambulation Distance (Feet): 200 Feet Assistive device: None Gait Pattern/deviations: Step-through pattern;Decreased stride length;Drifts right/left Gait velocity: decreased Gait velocity interpretation: Below normal speed for age/gender General Gait Details: Patient with slow, somewhat unsteady gait.  Requiring min assist for balance.  During gait, patient reports she is feeling lightheaded - like she is going to  pass out.  Returned to room and had patient return to supine in bed.  BP was 137/80.  RN called to room.  Patient reports headache 8/10, requesting pain meds and "nerve pill".  RN notified.   Stairs            Wheelchair Mobility    Modified Rankin (Stroke Patients Only) Modified Rankin (Stroke Patients Only) Pre-Morbid Rankin Score: Slight disability Modified Rankin: Moderately severe disability     Balance           Standing balance support: No upper extremity supported Standing balance-Leahy Scale: Fair Standing balance comment: Able to maintain static standing balance without support.  Required min assist during dynamic activities.  Patient limited by feeling off balance and weak.                    Cognition Arousal/Alertness: Awake/alert Behavior During Therapy: Restless;Impulsive Overall Cognitive Status: Within Functional Limits for tasks assessed                      Exercises      General Comments        Pertinent Vitals/Pain Pain Assessment: Faces Faces Pain Scale: Hurts whole lot Pain Location: Head Pain Descriptors / Indicators: Headache Pain Intervention(s): Limited activity within patient's tolerance;Patient requesting pain meds-RN notified    Home Living                      Prior Function            PT Goals (current goals can now be found in the care plan section) Acute Rehab PT Goals Patient Stated Goal: To  go home Progress towards PT goals: Progressing toward goals    Frequency  Min 4X/week    PT Plan Current plan remains appropriate    Co-evaluation             End of Session Equipment Utilized During Treatment: Gait belt Activity Tolerance: Treatment limited secondary to medical complications (Comment);Patient limited by fatigue (Lightheadedness, headache, weakness) Patient left: in bed;with call bell/phone within reach;with bed alarm set     Time: 6295-2841 PT Time Calculation (min) (ACUTE  ONLY): 19 min  Charges:  $Gait Training: 8-22 mins                    G Codes:      Vena Austria 2016-07-30, 8:00 PM Durenda Hurt. Renaldo Fiddler, [facility] Acute Rehab Services Pager [phone]

## 2016-07-13 NOTE — Progress Notes (Signed)
Hypoglycemic Event  CBG: 29  Treatment: 15 GM carbohydrate snack  Symptoms: Shaky  Follow-up CBG: Time: 2109 CBG Result:52  Possible Reasons for Event: Other: Pt has been having hypoglycemic events for the last few weeks per patient  Comments/MD notified: Another 15 GM snack given and glucose increased to 111.     Jerry Haugen, [employer]

## 2016-07-13 NOTE — Progress Notes (Signed)
PROGRESS NOTE                                                                                                                                                                                                             Patient Demographics:    Cassandra [name], is a 70 y.o. female, DOB - March 06, 1946, ZOX:[id number]  Admit date - 07/11/2016   Admitting Physician Bobette Mo, MD  Outpatient Primary MD for the patient is Irving Copas, MD  LOS - 0  Outpatient Specialists:None  Chief Complaint  Patient presents with  . Dizziness  . Shortness of Breath       Brief Narrative  70 year old female with history of anxiety, bipolar disorder, depression, since hypertension, type 2 diabetes mellitus, (admitted recently with DKA) hypothyroidism, BPPV, GERD, history of CVA, history of alcohol abuse, tobacco use and B12 deficiency presented to the ED with dizziness and blurred vision since one day which is associated with some dyspnea and weakness and increased pitting edema of the legs. Patient first went to her PCP office and complained of feeling dizzy when trying to ambulate and also had some blurred vision. She describes her symptoms to be similar to her BPPV which usually gets better shortly after taking meclizine or sitting down. However the symptoms were prolonged and blurry vision was new. She felt like she was going to fall and had unsteady gait as well. Patient found to be in mild diastolic CHF and admitted to hospitalist service.   Subjective:   She still feels dizzy with occasional blurred vision. Dyspnea better.    Assessment  & Plan :    Principal Problem: Acute ischemic stroke Patient presented with near syncope, blurred vision and dizziness. MRI done on 7/28 shows new left thalamic stroke. Stroke pathway initiated. Pt is already on ASA and plavix. Carotid US without significant stenosis. PT/ OT SLP eval.  Lipid panel with elevated TAG. Further recs per neurology.     Acute diastolic CHF (congestive heart failure) (HCC) Symptoms improved on low dose IV lasix bid. Marland Kitchen  2-D echo with normal EF and grade 1 diastolic dysfunction. Monitor strict I/O and daily weight. Continue aspirin and statin.   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 Patient now hypoglycemic. Reports several episodes of low cbg  in 20s at home past week. Reduced lantus to 7 units. Check A1C. 9 last A1C 8 weeks back was 7.3)  Hypoglycemia in type 1 DM  several hospitalization for DKA. ot having off and on hypoglycemic episodes. Suspect this is in the setting of skipping her meals. Reduced lantus dose. Monitor on sensitive coverage.  Recheck A1C. Will order bedtime snack.     CKD (chronic kidney disease) stage 3, GFR 30-59 ml/min Renal function at baseline.    Hypothyroidism Continue Synthroid. ( TSH elevated to 15 recently, dose wasn't adjusted, low normal free t4). I will increase her synthroid dose to 100 ug daily.  BPPV Continue meclizine.   History of stroke Continue Plavix and statin.  Anxiety Continue home dose Xanax.    Code Status : Full code  Family Communication  : None at bedside  Disposition Plan  : Pending clinical improvement.   Barriers For Discharge : Workup pending  Consults  :  None  Procedures  :  Head CT MRI brain  DVT Prophylaxis  :  Lovenox   Lab Results  Component Value Date   PLT 218 07/12/2016    Antibiotics  :    Anti-infectives    None        Objective:   Vitals:   07/13/16 0000 07/13/16 0200 07/13/16 0434 07/13/16 0500  BP: (!) 148/71 (!) 147/72 (!) 145/65 (!) 150/72  Pulse: 74 74 73 75  Resp: 16 16 16 17   Temp: 99 F (37.2 C) 99.5 F (37.5 C) 99 F (37.2 C) 99 F (37.2 C)  TempSrc: Oral Oral Oral Oral  SpO2: 95% 96% 96% 96%  Weight:    49.2 kg (108 lb 8 oz)  Height:        Wt  Readings from Last 3 Encounters:  07/13/16 49.2 kg (108 lb 8 oz)  06/29/16 52.3 kg (115 lb 6.4 oz)  05/19/16 53.4 kg (117 lb 11.6 oz)     Intake/Output Summary (Last 24 hours) at 07/13/16 1058 Last data filed at 07/12/16 1527  Gross per 24 hour  Intake              120 ml  Output              200 ml  Net              -80 ml     Physical Exam  Gen: Appears tired HEENT: moist mucosa, supple neck Chest: Clear bilaterally CVS: N S1&S2, no murmurs, rubs or gallop GI: soft, NT, ND, BS+ Musculoskeletal: warm, trace pitting edema CNS: Alert and oriented, nonfocal    Data Review:    CBC  Recent Labs Lab 07/11/16 1600 07/11/16 1720 07/12/16 0650  WBC 10.7*  --  5.8  HGB 9.4* 9.9* 8.5*  HCT 27.7* 29.0* 24.6*  PLT 297  --  218  MCV 91.1  --  90.4  MCH 30.9  --  31.3  MCHC 33.9  --  34.6  RDW 13.6  --  13.9  LYMPHSABS 2.3  --   --   MONOABS 0.5  --   --   EOSABS 0.4  --   --   BASOSABS 0.1  --   --     Chemistries   Recent Labs Lab 07/11/16 1600 07/11/16 1720 07/12/16 0650 07/13/16 0226  NA 138 139 139 135  K 4.5 4.6 3.6 4.4  CL 106 107 104 103  CO2 23  --  25 27  GLUCOSE  125* 121* 138* 273*  BUN 21* 24* 15 15  CREATININE 1.40* 1.30* 1.15* 1.25*  CALCIUM 9.3  --  8.5* 8.1*  AST 20  --   --   --   ALT 14  --   --   --   ALKPHOS 64  --   --   --   BILITOT 0.4  --   --   --    ------------------------------------------------------------------------------------------------------------------  Recent Labs  07/13/16 0225  CHOL 185  HDL 64  LDLCALC 45  TRIG 378*  CHOLHDL 2.9    Lab Results  Component Value Date   HGBA1C 7.3 (H) 05/17/2016   ------------------------------------------------------------------------------------------------------------------ No results for input(s): TSH, T4TOTAL, T3FREE, THYROIDAB in the last 72 hours.  Invalid input(s):  FREET3 ------------------------------------------------------------------------------------------------------------------ No results for input(s): VITAMINB12, FOLATE, FERRITIN, TIBC, IRON, RETICCTPCT in the last 72 hours.  Coagulation profile  Recent Labs Lab 07/11/16 1600  INR 0.90    No results for input(s): DDIMER in the last 72 hours.  Cardiac Enzymes  Recent Labs Lab 07/12/16 0156 07/12/16 0650  TROPONINI <0.03 <0.03   ------------------------------------------------------------------------------------------------------------------    Component Value Date/Time   BNP 469.1 (H) 07/11/2016 1600    Inpatient Medications  Scheduled Meds: .  stroke: mapping our early stages of recovery book   Does not apply Once  . amLODipine  5 mg Oral Daily  . aspirin EC  81 mg Oral Daily  . atorvastatin  10 mg Oral QHS  . clopidogrel  75 mg Oral Daily  . cyanocobalamin  1,000 mcg Intramuscular Q30 days  . enoxaparin (LOVENOX) injection  40 mg Subcutaneous QHS  . escitalopram  10 mg Oral QHS  . feeding supplement (GLUCERNA SHAKE)  237 mL Oral TID BM  . folic acid  1 mg Oral Daily  . furosemide  20 mg Intravenous BID  . insulin aspart  0-5 Units Subcutaneous QHS  . insulin aspart  0-9 Units Subcutaneous TID WC  . insulin detemir  7 Units Subcutaneous QHS  . iron polysaccharides  150 mg Oral BID  . levothyroxine  75 mcg Oral QAC breakfast  . loratadine  10 mg Oral Daily  . pantoprazole  40 mg Oral BID  . polyethylene glycol  17 g Oral QODAY  . saccharomyces boulardii  250 mg Oral BID  . sodium chloride  1 drop Both Eyes Daily  . sodium chloride flush  3 mL Intravenous Q12H  . sodium chloride flush  3 mL Intravenous Q12H  . thiamine  100 mg Oral Daily   Continuous Infusions:  PRN Meds:.sodium chloride, alum & mag hydroxide-simeth, LORazepam, meclizine, ondansetron (ZOFRAN) IV, sodium chloride flush  Micro Results No results found for this or any previous visit (from the past  240 hour(s)).  Radiology Reports Dg Chest 2 View  Result Date: 07/11/2016 CLINICAL DATA:  Bilateral lower extremity swelling for the past 2 days. EXAM: CHEST  2 VIEW COMPARISON:  06/22/2016; 05/10/2016; chest CT - 05/04/2016 FINDINGS: Grossly unchanged borderline enlarged cardiac silhouette. Normal mediastinal contours. Atherosclerotic plaque when the thoracic aorta. The lungs appear hyperexpanded with flattening of the bilateral hemidiaphragms mild diffuse slightly nodular thickening of the pulmonary interstitium. Interval development of a trace right-sided effusion with associated right basilar heterogeneous opacities. No definite evidence of edema. No acute osseus abnormalities. Post lower cervical ACDF, incompletely evaluated. IMPRESSION: 1. Interval development of a trace right-sided effusion with associated right basilar opacities, atelectasis versus infiltrate. A follow-up chest radiograph in 3 to 4 weeks after treatment  is recommended to ensure resolution. 2. Cardiomegaly without evidence of edema. 3.  Emphysema. (ICD10-J43.9) 4.  Aortic Atherosclerosis (ICD10-170.0) Electronically Signed   By: Simonne Come M.D.   On: 07/11/2016 21:23  Ct Head Wo Contrast  Result Date: 07/11/2016 CLINICAL DATA:  Weakness with dizziness and blurred vision. Headache. EXAM: CT HEAD WITHOUT CONTRAST TECHNIQUE: Contiguous axial images were obtained from the base of the skull through the vertex without intravenous contrast. COMPARISON:  06/22/2016. FINDINGS: There is no evidence for acute hemorrhage, hydrocephalus, mass lesion, or abnormal extra-axial fluid collection. No definite CT evidence for acute infarction. Diffuse loss of parenchymal volume is consistent with atrophy. Patchy low attenuation in the deep hemispheric and periventricular white matter is nonspecific, but likely reflects chronic microvascular ischemic demyelination. The visualized paranasal sinuses and mastoid air cells are clear. IMPRESSION: Stable.  No  acute intracranial abnormality. Atrophy with chronic small vessel white matter ischemic disease. Electronically Signed   By: Kennith Center M.D.   On: 07/11/2016 18:00  Ct Head Wo Contrast  Result Date: 06/22/2016 CLINICAL DATA:  70 year old female with acute encephalopathy. EXAM: CT HEAD WITHOUT CONTRAST TECHNIQUE: Contiguous axial images were obtained from the base of the skull through the vertex without intravenous contrast. COMPARISON:  Head CT 05/06/2016. FINDINGS: Patchy and confluent areas of decreased attenuation are noted throughout the deep and periventricular white matter of the cerebral hemispheres bilaterally, compatible with chronic microvascular ischemic disease. No acute intracranial abnormalities. Specifically, no evidence of acute intracranial hemorrhage, no definite findings of acute/subacute cerebral ischemia, no mass, mass effect, hydrocephalus or abnormal intra or extra-axial fluid collections. Visualized paranasal sinuses and mastoids are well pneumatized. No acute displaced skull fractures are identified. IMPRESSION: 1. No acute intracranial abnormalities. 2. Chronic microvascular ischemic changes in cerebral white matter redemonstrated, as above. Electronically Signed   By: Trudie Reed M.D.   On: 06/22/2016 22:30   Mr Brain Wo Contrast  Result Date: 07/12/2016 CLINICAL DATA:  Worsening dizziness. EXAM: MRI HEAD WITHOUT CONTRAST TECHNIQUE: Multiplanar, multiecho pulse sequences of the brain and surrounding structures were obtained without intravenous contrast. COMPARISON:  CT 07/11/2016.  MRI 05/16/2016. FINDINGS: Diffusion imaging shows a punctate acute infarction in the lateral thalamus on the left. No other acute infarction. Chronic small-vessel ischemic changes affect the pons. No focal cerebellar insult. Old small vessel infarctions affect the thalami and are present throughout the hemispheric deep white matter. Small old right occipital cortical infarction which was acute in  June. No mass lesion, hemorrhage, hydrocephalus or extra-axial collection. No pituitary mass. No inflammatory sinus disease. No skull or skullbase lesion. IMPRESSION: New punctate acute infarction in the left lateral thalamus. No other acute infarction. Old ischemic changes as seen previously, including a right occipital cortical infarction that was acute in June of this year. Electronically Signed   By: Paulina Fusi M.D.   On: 07/12/2016 12:05  Dg Chest Portable 1 View  Result Date: 06/22/2016 CLINICAL DATA:  Cough.  History of pneumonia.  Smoker. EXAM: PORTABLE CHEST 1 VIEW COMPARISON:  05/10/2016 chest radiograph. FINDINGS: Surgical hardware from ACDF overlies the lower cervical spine. Stable cardiomediastinal silhouette with normal heart size. No pneumothorax. No pleural effusion. No pulmonary edema. No acute consolidative airspace disease. IMPRESSION: No active disease. Electronically Signed   By: Delbert Phenix M.D.   On: 06/22/2016 15:40    Time Spent in minutes  35   Eddie North M.D on 07/13/2016 at 10:58 AM  Between 7am to 7pm - Pager - [phone]  After 7pm  go to [email or web address] - password [email or web address]  [facility] -  Office  [phone]

## 2016-07-13 NOTE — Consult Note (Signed)
Initial Neurological Consultation                      NEURO HOSPITALIST CONSULT NOTE   Requestig physician:   Dr. Gonzella Lex   Reason for Consult:  Small left thalamic infarct   HPI:                                                                                                                                          Cassandra [name] is an 70 y.o. female who presented to the hospital yesterday with a complaint of some blurring of vision and a sensation of lightheadedness. Sahni has a significant past medical history including risk factors for stroke such as her hypertension, diabetes, hyperlipidemia, and her history of smoking.  After admission to the hospital Billy underwent MRI of the brain. This revealed a small left thalamic infarction. It is unclear to what extent that this may have contributed to her symptoms.   Past Medical History:  Diagnosis Date  . Allergic rhinitis   . Anxiety   . Benign hypertension with CKD (chronic kidney disease) stage III   . Benign paroxysmal positional vertigo   . Bipolar disorder (HCC)   . Broken finger   . Broken shoulder   . Broken toes   . Cervicalgia   . CKD (chronic kidney disease) stage 3, GFR 30-59 ml/min   . Depression   . Diabetes mellitus without complication (HCC)   . Elevated liver enzymes Hep B/C neg 2014  . GERD (gastroesophageal reflux disease)   . Glaucoma   . History of alcohol use   . Hypertension   . Hypothyroidism   . Interstitial cystitis    bladder stretched every 9 months  . Migraines   . Psoriasis   . Stroke (HCC) 05/16/2016   Left occipital and thalamic, right hippocampal  . Thyroid disease   . Tobacco use   . 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 Vitamin B12 deficiency     Past Surgical History:  Procedure Laterality Date  . ABDOMINAL HYSTERECTOMY     with oophorectomy  . APPENDECTOMY    . BLADDER SURGERY    . CATARACT EXTRACTION    . CERVICAL DISC SURGERY    .  CHOLECYSTECTOMY    . HERNIA REPAIR    . TONSILLECTOMY      MEDICATIONS:  I have reviewed the patient's current medications.  Allergies  Allergen Reactions  . Ciprofloxacin Other (See Comments)    Pt does not recall specific reaction  . Codeine Diarrhea and Nausea And Vomiting  . Doxycycline Diarrhea and Nausea And Vomiting  . Hydrocodone Other (See Comments)    Pt does not recall specific reaction  . Omnicef [Cefdinir] Nausea Only and Other (See Comments)    Constipation, tolerated Zosyn     Social History:  reports that she has been smoking Cigarettes.  She has been smoking about 0.25 packs per day. She has never used smokeless tobacco. She reports that she does not drink alcohol or use drugs.  Family History  Problem Relation Age of Onset  . Alcohol abuse Mother   . Arthritis Mother   . Asthma Mother   . Cancer Mother     colon cancer  . Hypertension Mother   . Migraines Mother   . Stroke Mother   . Lung disease Mother   . COPD Mother   . Diabetes Father   . Hypertension Father   . Heart disease Father   . Heart attack Father   . Heart disease Paternal Grandmother   . Diabetes Paternal Grandmother   . Stroke Paternal Grandmother   . Cancer Paternal Grandmother   . Diabetes Paternal Grandfather      ROS:                                                                                                                                       History obtained from chart review  General ROS: negative for - chills, fatigue, fever, night sweats, weight gain or weight loss Psychological ROS: negative for - behavioral disorder, hallucinations, memory difficulties, mood swings or suicidal ideation Ophthalmic ROS: negative for - blurry vision, double vision, eye pain or loss of vision ENT ROS: negative for - epistaxis, nasal discharge, oral lesions, sore throat,  tinnitus or vertigo Allergy and Immunology ROS: negative for - hives or itchy/watery eyes Hematological and Lymphatic ROS: negative for - bleeding problems, bruising or swollen lymph nodes Endocrine ROS: negative for - galactorrhea, hair pattern changes, polydipsia/polyuria or temperature intolerance Respiratory ROS: negative for - cough, hemoptysis, shortness of breath or wheezing Cardiovascular ROS: negative for - chest pain, dyspnea on exertion, edema or irregular heartbeat Gastrointestinal ROS: negative for - abdominal pain, diarrhea, hematemesis, nausea/vomiting or stool incontinence Genito-Urinary ROS: negative for - dysuria, hematuria, incontinence or urinary frequency/urgency Musculoskeletal ROS: negative for - joint swelling or muscular weakness Neurological ROS: as noted in HPI Dermatological ROS: negative for rash and skin lesion changes   General Exam  Blood pressure (!) 150/72, pulse 75, temperature 99 F (37.2 C), temperature source Oral, resp. rate 17, height 5\' 2"  (1.575 m), weight 49.2 kg (108 lb 8 oz), SpO2 96 %. HEENT-  Normocephalic, no lesions, without obvious abnormality.  Normal external eye and conjunctiva.  Normal TM's bilaterally.  Normal auditory canals and external ears. Normal external nose, mucus membranes and septum.  Normal pharynx. Cardiovascular- regular rate and rhythm, S1, S2 normal, no murmur, click, rub or gallop, pulses palpable throughout   Lungs- chest clear, no wheezing, rales, normal symmetric air entry, Heart exam - S1, S2 normal, no murmur, no gallop, rate regular Abdomen- soft, non-tender; bowel sounds normal; no masses,  no organomegaly Extremities- less then 2 second capillary refill Lymph-no adenopathy palpable Musculoskeletal-no joint tenderness, deformity or swelling Skin-warm and dry, no hyperpigmentation, vitiligo, or suspicious  lesions  Neurological Examination Mental Status: Alert, oriented, thought content appropriate.  Speech fluent without evidence of aphasia.  Able to follow 3 step commands without difficulty. Cranial Nerves: II: Discs flat bilaterally; Visual fields grossly normal, pupils equal, round, reactive to light and accommodation III,IV, VI: ptosis not present, extra-ocular motions intact bilaterally V,VII: smile symmetric, facial light touch sensation normal bilaterally VIII: hearing normal bilaterally IX,X: uvula rises symmetrically XI: bilateral shoulder shrug XII: midline tongue extension Motor: Right : Upper extremity   5/5    Left:     Upper extremity   5/5  Lower extremity   5/5     Lower extremity   5/5 Tone and bulk:normal tone throughout; no atrophy noted Sensory: Pinprick and light touch intact throughout, bilaterally Deep Tendon Reflexes: 2+ and symmetric throughout Plantars: Right: downgoing   Left: downgoing Cerebellar: normal finger-to-nose, normal rapid alternating movements and normal heel-to-shin test Gait: normal gait and station      Lab Results: Basic Metabolic Panel:  Recent Labs Lab 07/11/16 1600 07/11/16 1720 07/12/16 0650 07/13/16 0226  NA 138 139 139 135  K 4.5 4.6 3.6 4.4  CL 106 107 104 103  CO2 23  --  25 27  GLUCOSE 125* 121* 138* 273*  BUN 21* 24* 15 15  CREATININE 1.40* 1.30* 1.15* 1.25*  CALCIUM 9.3  --  8.5* 8.1*    Liver Function Tests:  Recent Labs Lab 07/11/16 1600  AST 20  ALT 14  ALKPHOS 64  BILITOT 0.4  PROT 6.2*  ALBUMIN 3.5   No results for input(s): LIPASE, AMYLASE in the last 168 hours. No results for input(s): AMMONIA in the last 168 hours.  CBC:  Recent Labs Lab 07/11/16 1600 07/11/16 1720 07/12/16 0650  WBC 10.7*  --  5.8  NEUTROABS 7.6  --   --   HGB 9.4* 9.9* 8.5*  HCT 27.7* 29.0* 24.6*  MCV 91.1  --  90.4  PLT 297  --  218    Cardiac Enzymes:  Recent Labs Lab 07/12/16 0156 07/12/16 0650   TROPONINI <0.03 <0.03    Lipid Panel:  Recent Labs Lab 07/13/16 0225  CHOL 185  TRIG 378*  HDL 64  CHOLHDL 2.9  VLDL 76*  LDLCALC 45    CBG:  Recent Labs Lab 07/12/16 2041 07/12/16 2109 07/12/16 2159 07/13/16 0208 07/13/16 0656  GLUCAP 29* 52* 111* 279* 285*    Microbiology: Results for orders placed or performed during the hospital encounter of 06/22/16  Culture, blood (routine x 2)     Status: None   Collection Time: 06/22/16  3:30 PM  Result Value Ref Range Status  Specimen Description RIGHT ANTECUBITAL  Final   Special Requests BOTTLES DRAWN AEROBIC AND ANAEROBIC 10CC  Final   Culture NO GROWTH 5 DAYS  Final   Report Status 06/27/2016 FINAL  Final  Culture, blood (routine x 2)     Status: None   Collection Time: 06/22/16  3:42 PM  Result Value Ref Range Status   Specimen Description BLOOD RIGHT HAND  Final   Special Requests IN PEDIATRIC BOTTLE 3CC  Final   Culture NO GROWTH 5 DAYS  Final   Report Status 06/27/2016 FINAL  Final  Urine culture     Status: None   Collection Time: 06/22/16  7:07 PM  Result Value Ref Range Status   Specimen Description URINE, CLEAN CATCH  Final   Special Requests Normal  Final   Culture NO GROWTH  Final   Report Status 06/23/2016 FINAL  Final  MRSA PCR Screening     Status: None   Collection Time: 06/22/16  8:15 PM  Result Value Ref Range Status   MRSA by PCR NEGATIVE NEGATIVE Final    Comment:        The GeneXpert MRSA Assay (FDA approved for NASAL specimens only), is one component of a comprehensive MRSA colonization surveillance program. It is not intended to diagnose MRSA infection nor to guide or monitor treatment for MRSA infections.     Coagulation Studies:  Recent Labs  07/11/16 1600  LABPROT 12.2  INR 0.90    Imaging: Dg Chest 2 View  Result Date: 07/11/2016 CLINICAL DATA:  Bilateral lower extremity swelling for the past 2 days. EXAM: CHEST  2 VIEW COMPARISON:  06/22/2016; 05/10/2016; chest  CT - 05/04/2016 FINDINGS: Grossly unchanged borderline enlarged cardiac silhouette. Normal mediastinal contours. Atherosclerotic plaque when the thoracic aorta. The lungs appear hyperexpanded with flattening of the bilateral hemidiaphragms mild diffuse slightly nodular thickening of the pulmonary interstitium. Interval development of a trace right-sided effusion with associated right basilar heterogeneous opacities. No definite evidence of edema. No acute osseus abnormalities. Post lower cervical ACDF, incompletely evaluated. IMPRESSION: 1. Interval development of a trace right-sided effusion with associated right basilar opacities, atelectasis versus infiltrate. A follow-up chest radiograph in 3 to 4 weeks after treatment is recommended to ensure resolution. 2. Cardiomegaly without evidence of edema. 3.  Emphysema. (ICD10-J43.9) 4.  Aortic Atherosclerosis (ICD10-170.0) Electronically Signed   By: Simonne Come M.D.   On: 07/11/2016 21:23  Ct Head Wo Contrast  Result Date: 07/11/2016 CLINICAL DATA:  Weakness with dizziness and blurred vision. Headache. EXAM: CT HEAD WITHOUT CONTRAST TECHNIQUE: Contiguous axial images were obtained from the base of the skull through the vertex without intravenous contrast. COMPARISON:  06/22/2016. FINDINGS: There is no evidence for acute hemorrhage, hydrocephalus, mass lesion, or abnormal extra-axial fluid collection. No definite CT evidence for acute infarction. Diffuse loss of parenchymal volume is consistent with atrophy. Patchy low attenuation in the deep hemispheric and periventricular white matter is nonspecific, but likely reflects chronic microvascular ischemic demyelination. The visualized paranasal sinuses and mastoid air cells are clear. IMPRESSION: Stable.  No acute intracranial abnormality. Atrophy with chronic small vessel white matter ischemic disease. Electronically Signed   By: Kennith Center M.D.   On: 07/11/2016 18:00  Mr Brain Wo Contrast  Result Date:  07/12/2016 CLINICAL DATA:  Worsening dizziness. EXAM: MRI HEAD WITHOUT CONTRAST TECHNIQUE: Multiplanar, multiecho pulse sequences of the brain and surrounding structures were obtained without intravenous contrast. COMPARISON:  CT 07/11/2016.  MRI 05/16/2016. FINDINGS: Diffusion imaging shows a punctate acute infarction  in the lateral thalamus on the left. No other acute infarction. Chronic small-vessel ischemic changes affect the pons. No focal cerebellar insult. Old small vessel infarctions affect the thalami and are present throughout the hemispheric deep white matter. Small old right occipital cortical infarction which was acute in June. No mass lesion, hemorrhage, hydrocephalus or extra-axial collection. No pituitary mass. No inflammatory sinus disease. No skull or skullbase lesion. IMPRESSION: New punctate acute infarction in the left lateral thalamus. No other acute infarction. Old ischemic changes as seen previously, including a right occipital cortical infarction that was acute in June of this year. Electronically Signed   By: Paulina Fusi M.D.   On: 07/12/2016 12:05   Assessment/Plan:  Cassandra [name] is a pleasant 70 year old patient who presents with slight blurring of vision and lightheadedness. At the present time the lightheadedness has improved and the mild blurring of vision has also improved as well.  Additional evaluations revealed the presence of a small left thalamic infarct. It is unclear to what extent that might have contributed to her present symptoms. Cassandra [name] has a number of known risk factors for stroke including diabetes, hypertension, hyperlipidemia, and smoking among others. Cassandra [name] is already on aspirin and Plavix at this time. Her echocardiogram revealed evidence of mild left ventricular hypertrophy. It would also be reasonable to evaluate carotid Dopplers for her as well. She is already tolerating by mouth without any difficulty. All of her risk factors for stroke are being appropriately  managed at this point.  Plan:  1. Carotid Dopplers.  2. Continue aspirin and Plavix.  3. Routine DVT prophylaxis.  4. Smoking cessation was encouraged.  5. We will notify the stroke team that Fredric Mare is admitted to the hospital.  Thank you for consulting the neurology service to assist in the care of your patient!    Angelina Venard A. Hilda Blades, M.D. Neurohospitalist Phone: [phone]  07/13/2016, 10:54 AM

## 2016-07-13 NOTE — Progress Notes (Addendum)
Received Diabetes Coordinator consult regarding patient's low blood sugars. Patient was admitted last month for DKA. Our team talked with her then. She has Type 1 diabetes and has been on Lantus and Humalog at home. The dosage at home was Lantus 10 units daily and Humalog 4-5 units TID with meals if blood sugar >200 mg/dl.  Patient needs basal insulin. Recommend decreasing Lantus to 7 units daily and using a very sensitive Novolog scale if blood sugars continue to be low. Start scale at 150. 150-200  1 unit 201-250  2 units 251-300  3 units 301-350  4 units 351-400  5 units >401      6 units and call physician  Will continue to monitor blood sugars while in the hospital. Smith Mince RN BSN CDE

## 2016-07-13 NOTE — Progress Notes (Signed)
Patient is refusing to wear yellow grippy socks.

## 2016-07-14 ENCOUNTER — Inpatient Hospital Stay (HOSPITAL_COMMUNITY): Payer: Medicare Other

## 2016-07-14 DIAGNOSIS — N179 Acute kidney failure, unspecified: Secondary | ICD-10-CM

## 2016-07-14 DIAGNOSIS — I6789 Other cerebrovascular disease: Secondary | ICD-10-CM

## 2016-07-14 LAB — GLUCOSE, CAPILLARY
GLUCOSE-CAPILLARY: 151 mg/dL — AB (ref 65–99)
GLUCOSE-CAPILLARY: 322 mg/dL — AB (ref 65–99)
GLUCOSE-CAPILLARY: 161 mg/dL — AB (ref 65–99)
Glucose-Capillary: 119 mg/dL — ABNORMAL HIGH (ref 65–99)
Glucose-Capillary: 154 mg/dL — ABNORMAL HIGH (ref 65–99)
Glucose-Capillary: 156 mg/dL — ABNORMAL HIGH (ref 65–99)
Glucose-Capillary: 182 mg/dL — ABNORMAL HIGH (ref 65–99)
Glucose-Capillary: 200 mg/dL — ABNORMAL HIGH (ref 65–99)

## 2016-07-14 LAB — ECHOCARDIOGRAM COMPLETE
Height: 62 in
WEIGHTICAEL: 1699.2 [oz_av]

## 2016-07-14 LAB — BASIC METABOLIC PANEL
Anion gap: 5 (ref 5–15)
BUN: 24 mg/dL — ABNORMAL HIGH (ref 6–20)
CALCIUM: 8.8 mg/dL — AB (ref 8.9–10.3)
CHLORIDE: 104 mmol/L (ref 101–111)
CO2: 29 mmol/L (ref 22–32)
CREATININE: 1.46 mg/dL — AB (ref 0.44–1.00)
GFR calc non Af Amer: 36 mL/min — ABNORMAL LOW (ref >60)
GFR, EST AFRICAN AMERICAN: 41 mL/min — AB (ref >60)
GLUCOSE: 115 mg/dL — AB (ref 65–99)
Potassium: 4.5 mmol/L (ref 3.5–5.1)
Sodium: 138 mmol/L (ref 135–145)

## 2016-07-14 LAB — URINALYSIS, ROUTINE W REFLEX MICROSCOPIC
Bilirubin Urine: NEGATIVE
Glucose, UA: 250 mg/dL — AB
HGB URINE DIPSTICK: NEGATIVE
KETONES UR: NEGATIVE mg/dL
Nitrite: NEGATIVE
Specific Gravity, Urine: 1.017 (ref 1.005–1.030)
pH: 6 (ref 5.0–8.0)

## 2016-07-14 LAB — URINE MICROSCOPIC-ADD ON
RBC / HPF: NONE SEEN RBC/hpf (ref 0–5)
WBC, UA: NONE SEEN WBC/hpf (ref 0–5)

## 2016-07-14 LAB — CORTISOL: Cortisol, Plasma: 14 ug/dL

## 2016-07-14 MED ORDER — TRAMADOL HCL 50 MG PO TABS
50.0000 mg | ORAL_TABLET | Freq: Once | ORAL | Status: AC
  Administered 2016-07-14: 50 mg via ORAL
  Filled 2016-07-14: qty 1

## 2016-07-14 MED ORDER — ENOXAPARIN SODIUM 30 MG/0.3ML ~~LOC~~ SOLN
30.0000 mg | Freq: Every day | SUBCUTANEOUS | Status: DC
  Administered 2016-07-14 – 2016-07-17 (×4): 30 mg via SUBCUTANEOUS
  Filled 2016-07-14 (×4): qty 0.3

## 2016-07-14 MED ORDER — INSULIN DETEMIR 100 UNIT/ML ~~LOC~~ SOLN
5.0000 [IU] | Freq: Every day | SUBCUTANEOUS | Status: DC
  Administered 2016-07-14: 5 [IU] via SUBCUTANEOUS
  Filled 2016-07-14 (×2): qty 0.05

## 2016-07-14 MED ORDER — FUROSEMIDE 20 MG PO TABS
20.0000 mg | ORAL_TABLET | Freq: Every day | ORAL | Status: DC
  Filled 2016-07-14: qty 1

## 2016-07-14 MED ORDER — TRAMADOL HCL 50 MG PO TABS
50.0000 mg | ORAL_TABLET | Freq: Four times a day (QID) | ORAL | Status: DC | PRN
  Administered 2016-07-14 – 2016-07-17 (×4): 50 mg via ORAL
  Filled 2016-07-14 (×5): qty 1

## 2016-07-14 NOTE — Progress Notes (Signed)
  Echocardiogram 2D Echocardiogram has been performed.  Nolon Rod 07/14/2016, 11:40 AM

## 2016-07-14 NOTE — Progress Notes (Signed)
PROGRESS NOTE                                                                                                                                                                                                             Patient Demographics:    Cassandra [name], is a 70 y.o. female, DOB - Feb 28, 1946, ZOX:[id number]  Admit date - 07/11/2016   Admitting Physician Bobette Mo, MD  Outpatient Primary MD for the patient is Irving Copas, MD  LOS - 1  Outpatient Specialists:None  Chief Complaint  Patient presents with  . Dizziness  . Shortness of Breath       Brief Narrative  70 year old female with history of anxiety, bipolar disorder, depression, since hypertension, type 2 diabetes mellitus, (admitted recently with DKA) hypothyroidism, BPPV, GERD, history of CVA, history of alcohol abuse, tobacco use and B12 deficiency presented to the ED with dizziness and blurred vision since one day which is associated with some dyspnea and weakness and increased pitting edema of the legs. Patient first went to her PCP office and complained of feeling dizzy when trying to ambulate and also had some blurred vision. She describes her symptoms to be similar to her BPPV which usually gets better shortly after taking meclizine or sitting down. However the symptoms were prolonged and blurry vision was new. She felt like she was going to fall and had unsteady gait as well. Patient found to be in mild diastolic CHF and admitted to hospitalist service.   Subjective:   Appearance of headache. Having recurrent hypoglycemic episodes.   Assessment  & Plan :    Principal Problem: Acute ischemic stroke MRI  shows new left thalamic stroke.  Pt is already on ASA and plavix. Carotid US without significant stenosis.2-D echo without cardiac source of emboli. PT recommends home health with supervision. Lipid panel with elevated TAG. Continue  statin. Appreciate neurology evaluation.     Acute diastolic CHF (congestive heart failure) (HCC) Symptoms improved on low dose IV lasix bid. Transition to oral Lasix.Marland Kitchen  2-D echo with normal EF and grade 1 diastolic dysfunction. Monitor strict I/O and daily weight. Continue aspirin and statin.   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 Patient now hypoglycemic. Reports several episodes of low cbg in 20s at home past  week. Reduced lantus to 7 units. Repeat A1c pending ( 8 weeks back was 7.3)  Hypoglycemia in type 1 DM  several hospitalization for DKA. ot having off and on hypoglycemic episodes. Suspect this is in the setting of or by mouth intake.  monitor on reduced dose of Lantus. Encouraged frequent small meals and bedtime snack.    Acute on CKD (chronic kidney disease) stage 3, GFR 30-59 ml/min Possibly due to Lasix. Will reduce dose.    Hypothyroidism Synthroid dose increased (recently checked TSH of 15 with low normal free T4)  BPPV Continue meclizine.   History of stroke Continue Plavix and statin.  Anxiety Continue home dose Xanax.    Code Status : Full code  Family Communication  : None at bedside  Disposition Plan  : Possibly in the next 24 hours if no further hypoglycemic episodes  Barriers For Discharge : Hypoglycemia  Consults  :  None  Procedures  :  Head CT MRI brain  DVT Prophylaxis  :  Lovenox   Lab Results  Component Value Date   PLT 218 07/12/2016    Antibiotics  :    Anti-infectives    None        Objective:   Vitals:   07/13/16 2115 07/14/16 0133 07/14/16 0500 07/14/16 0823  BP: 122/64 (!) 131/57 127/63   Pulse: 60 70 68   Resp: Temp: 98.6 F (37 C) 98.6 F (37 C) 98.4 F (36.9 C)   TempSrc: Oral Oral Oral   SpO2: 98% 93% 96% 97%  Weight:   48.2 kg (106 lb 3.2 oz)   Height:        Wt Readings from Last 3 Encounters:  07/14/16 48.2 kg (106  lb 3.2 oz)  06/29/16 52.3 kg (115 lb 6.4 oz)  05/19/16 53.4 kg (117 lb 11.6 oz)    No intake or output data in the 24 hours ending 07/14/16 1253   Physical Exam  Gen: Appears tired HEENT: moist mucosa, supple neck Chest: Clear bilaterally CVS: N S1&S2, no murmurs, GI: soft, NT, ND, BS+ Musculoskeletal: warm, no edema CNS: Alert and oriented, nonfocal    Data Review:    CBC  Recent Labs Lab 07/11/16 1600 07/11/16 1720 07/12/16 0650  WBC 10.7*  --  5.8  HGB 9.4* 9.9* 8.5*  HCT 27.7* 29.0* 24.6*  PLT 297  --  218  MCV 91.1  --  90.4  MCH 30.9  --  31.3  MCHC 33.9  --  34.6  RDW 13.6  --  13.9  LYMPHSABS 2.3  --   --   MONOABS 0.5  --   --   EOSABS 0.4  --   --   BASOSABS 0.1  --   --     Chemistries   Recent Labs Lab 07/11/16 1600 07/11/16 1720 07/12/16 0650 07/13/16 0226 07/14/16 0547  NA 138 139 139 135 138  K 4.5 4.6 3.6 4.4 4.5  CL 106 107 104 103 104  CO2 23  --  GLUCOSE 125* 121* 138* 273* 115*  BUN 21* 24* 15 15 24*  CREATININE 1.40* 1.30* 1.15* 1.25* 1.46*  CALCIUM 9.3  --  8.5* 8.1* 8.8*  AST 20  --   --   --   --   ALT 14  --   --   --   --   ALKPHOS 64  --   --   --   --  BILITOT 0.4  --   --   --   --    ------------------------------------------------------------------------------------------------------------------  Recent Labs  07/13/16 0225  CHOL 185  HDL 64  LDLCALC 45  TRIG 378*  CHOLHDL 2.9    Lab Results  Component Value Date   HGBA1C 7.3 (H) 05/17/2016   ------------------------------------------------------------------------------------------------------------------ No results for input(s): TSH, T4TOTAL, T3FREE, THYROIDAB in the last 72 hours.  Invalid input(s): FREET3 ------------------------------------------------------------------------------------------------------------------ No results for input(s): VITAMINB12, FOLATE, FERRITIN, TIBC, IRON, RETICCTPCT in the last 72 hours.  Coagulation  profile  Recent Labs Lab 07/11/16 1600  INR 0.90    No results for input(s): DDIMER in the last 72 hours.  Cardiac Enzymes  Recent Labs Lab 07/12/16 0156 07/12/16 0650  TROPONINI <0.03 <0.03   ------------------------------------------------------------------------------------------------------------------    Component Value Date/Time   BNP 469.1 (H) 07/11/2016 1600    Inpatient Medications  Scheduled Meds: .  stroke: mapping our early stages of recovery book   Does not apply Once  . amLODipine  5 mg Oral Daily  . aspirin EC  81 mg Oral Daily  . atorvastatin  10 mg Oral QHS  . clopidogrel  75 mg Oral Daily  . cyanocobalamin  1,000 mcg Intramuscular Q30 days  . enoxaparin (LOVENOX) injection  40 mg Subcutaneous QHS  . escitalopram  10 mg Oral QHS  . feeding supplement (GLUCERNA SHAKE)  237 mL Oral TID BM  . folic acid  1 mg Oral Daily  . furosemide  20 mg Intravenous BID  . insulin aspart  0-5 Units Subcutaneous QHS  . insulin aspart  0-9 Units Subcutaneous TID WC  . insulin detemir  5 Units Subcutaneous QHS  . iron polysaccharides  150 mg Oral BID  . levothyroxine  75 mcg Oral QAC breakfast  . loratadine  10 mg Oral Daily  . pantoprazole  40 mg Oral BID  . polyethylene glycol  17 g Oral QODAY  . saccharomyces boulardii  250 mg Oral BID  . sodium chloride  1 drop Both Eyes Daily  . sodium chloride flush  3 mL Intravenous Q12H  . sodium chloride flush  3 mL Intravenous Q12H  . thiamine  100 mg Oral Daily   Continuous Infusions:  PRN Meds:.sodium chloride, acetaminophen, alum & mag hydroxide-simeth, LORazepam, meclizine, ondansetron (ZOFRAN) IV, sodium chloride flush  Micro Results No results found for this or any previous visit (from the past 240 hour(s)).  Radiology Reports Dg Chest 2 View  Result Date: 07/11/2016 CLINICAL DATA:  Bilateral lower extremity swelling for the past 2 days. EXAM: CHEST  2 VIEW COMPARISON:  06/22/2016; 05/10/2016; chest CT -  05/04/2016 FINDINGS: Grossly unchanged borderline enlarged cardiac silhouette. Normal mediastinal contours. Atherosclerotic plaque when the thoracic aorta. The lungs appear hyperexpanded with flattening of the bilateral hemidiaphragms mild diffuse slightly nodular thickening of the pulmonary interstitium. Interval development of a trace right-sided effusion with associated right basilar heterogeneous opacities. No definite evidence of edema. No acute osseus abnormalities. Post lower cervical ACDF, incompletely evaluated. IMPRESSION: 1. Interval development of a trace right-sided effusion with associated right basilar opacities, atelectasis versus infiltrate. A follow-up chest radiograph in 3 to 4 weeks after treatment is recommended to ensure resolution. 2. Cardiomegaly without evidence of edema. 3.  Emphysema. (ICD10-J43.9) 4.  Aortic Atherosclerosis (ICD10-170.0) Electronically Signed   By: Simonne Come M.D.   On: 07/11/2016 21:23  Ct Head Wo Contrast  Result Date: 07/11/2016 CLINICAL DATA:  Weakness with dizziness and blurred vision. Headache. EXAM: CT HEAD WITHOUT  CONTRAST TECHNIQUE: Contiguous axial images were obtained from the base of the skull through the vertex without intravenous contrast. COMPARISON:  06/22/2016. FINDINGS: There is no evidence for acute hemorrhage, hydrocephalus, mass lesion, or abnormal extra-axial fluid collection. No definite CT evidence for acute infarction. Diffuse loss of parenchymal volume is consistent with atrophy. Patchy low attenuation in the deep hemispheric and periventricular white matter is nonspecific, but likely reflects chronic microvascular ischemic demyelination. The visualized paranasal sinuses and mastoid air cells are clear. IMPRESSION: Stable.  No acute intracranial abnormality. Atrophy with chronic small vessel white matter ischemic disease. Electronically Signed   By: Kennith Center M.D.   On: 07/11/2016 18:00  Ct Head Wo Contrast  Result Date:  06/22/2016 CLINICAL DATA:  70 year old female with acute encephalopathy. EXAM: CT HEAD WITHOUT CONTRAST TECHNIQUE: Contiguous axial images were obtained from the base of the skull through the vertex without intravenous contrast. COMPARISON:  Head CT 05/06/2016. FINDINGS: Patchy and confluent areas of decreased attenuation are noted throughout the deep and periventricular white matter of the cerebral hemispheres bilaterally, compatible with chronic microvascular ischemic disease. No acute intracranial abnormalities. Specifically, no evidence of acute intracranial hemorrhage, no definite findings of acute/subacute cerebral ischemia, no mass, mass effect, hydrocephalus or abnormal intra or extra-axial fluid collections. Visualized paranasal sinuses and mastoids are well pneumatized. No acute displaced skull fractures are identified. IMPRESSION: 1. No acute intracranial abnormalities. 2. Chronic microvascular ischemic changes in cerebral white matter redemonstrated, as above. Electronically Signed   By: Trudie Reed M.D.   On: 06/22/2016 22:30   Mr Brain Wo Contrast  Result Date: 07/12/2016 CLINICAL DATA:  Worsening dizziness. EXAM: MRI HEAD WITHOUT CONTRAST TECHNIQUE: Multiplanar, multiecho pulse sequences of the brain and surrounding structures were obtained without intravenous contrast. COMPARISON:  CT 07/11/2016.  MRI 05/16/2016. FINDINGS: Diffusion imaging shows a punctate acute infarction in the lateral thalamus on the left. No other acute infarction. Chronic small-vessel ischemic changes affect the pons. No focal cerebellar insult. Old small vessel infarctions affect the thalami and are present throughout the hemispheric deep white matter. Small old right occipital cortical infarction which was acute in June. No mass lesion, hemorrhage, hydrocephalus or extra-axial collection. No pituitary mass. No inflammatory sinus disease. No skull or skullbase lesion. IMPRESSION: New punctate acute infarction in the  left lateral thalamus. No other acute infarction. Old ischemic changes as seen previously, including a right occipital cortical infarction that was acute in June of this year. Electronically Signed   By: Paulina Fusi M.D.   On: 07/12/2016 12:05  Dg Chest Portable 1 View  Result Date: 06/22/2016 CLINICAL DATA:  Cough.  History of pneumonia.  Smoker. EXAM: PORTABLE CHEST 1 VIEW COMPARISON:  05/10/2016 chest radiograph. FINDINGS: Surgical hardware from ACDF overlies the lower cervical spine. Stable cardiomediastinal silhouette with normal heart size. No pneumothorax. No pleural effusion. No pulmonary edema. No acute consolidative airspace disease. IMPRESSION: No active disease. Electronically Signed   By: Delbert Phenix M.D.   On: 06/22/2016 15:40    Time Spent in minutes  25   Eddie North M.D on 07/14/2016 at 12:53 PM  Between 7am to 7pm - Pager - [phone]  After 7pm go to [email or web address] - password [email or web address]  [facility] -  Office  [phone]

## 2016-07-14 NOTE — Progress Notes (Signed)
STROKE TEAM PROGRESS NOTE   HISTORY OF PRESENT ILLNESS (per record) Cassandra [name] is an 70 y.o. female who presented to the hospital yesterday with a complaint of some blurring of vision and a sensation of lightheadedness. Cassandra [name] has a significant past medical history including risk factors for stroke such as her hypertension, diabetes, hyperlipidemia, and her history of smoking.  After admission to the hospital Cassandra [name] underwent MRI of the brain. This revealed a small left thalamic infarction. It is unclear to what extent that this may have contributed to her symptoms.  SUBJECTIVE (INTERVAL HISTORY) Her husband s at the bedside.  Overall she feels her condition is better.  She complains of blurry vision that has worsened over time,  She agreed to see ophth upon discharge.  Cassandra [name] ROS was negative   OBJECTIVE Temp:  [97.8 F (36.6 C)-98.7 F (37.1 C)] 97.8 F (36.6 C) (07/30 1423) Pulse Rate:  [57-76] 76 (07/30 1423) Cardiac Rhythm: Normal sinus rhythm (07/30 0700) Resp:  [18-20] 20 (07/30 1423) BP: (114-131)/(57-64) 126/63 (07/30 1423) SpO2:  [93 %-99 %] 99 % (07/30 1423) Weight:  [48.2 kg (106 lb 3.2 oz)] 48.2 kg (106 lb 3.2 oz) (07/30 0500)  CBC:   Recent Labs Lab 07/11/16 1600 07/11/16 1720 07/12/16 0650  WBC 10.7*  --  5.8  NEUTROABS 7.6  --   --   HGB 9.4* 9.9* 8.5*  HCT 27.7* 29.0* 24.6*  MCV 91.1  --  90.4  PLT 297  --  218    Basic Metabolic Panel:   Recent Labs Lab 07/13/16 0226 07/14/16 0547  NA 135 138  K 4.4 4.5  CL 103 104  CO2 27 29  GLUCOSE 273* 115*  BUN 15 24*  CREATININE 1.25* 1.46*  CALCIUM 8.1* 8.8*    Lipid Panel:     Component Value Date/Time   CHOL 185 07/13/2016 0225   TRIG 378 (H) 07/13/2016 0225   HDL 64 07/13/2016 0225   CHOLHDL 2.9 07/13/2016 0225   VLDL 76 (H) 07/13/2016 0225   LDLCALC 45 07/13/2016 0225   HgbA1c:  Lab Results  Component Value Date   HGBA1C 7.3 (H) 05/17/2016   Urine Drug Screen:     Component  Value Date/Time   LABOPIA NONE DETECTED 05/06/2016 1136   COCAINSCRNUR NONE DETECTED 05/06/2016 1136   COCAINSCRNUR NEGATIVE 06/16/2012 1924   LABBENZ NONE DETECTED 05/06/2016 1136   AMPHETMU NONE DETECTED 05/06/2016 1136   THCU NONE DETECTED 05/06/2016 1136   LABBARB NONE DETECTED 05/06/2016 1136      IMAGING  MRI Brain Wo Contrast 07/12/2016 New punctate acute infarction in the left lateral thalamus. No other acute infarction. Old ischemic changes as seen previously, including a right occipital cortical infarction that was acute in June of this year.   CT head without contrast 07/11/2016 Stable.  No acute intracranial abnormality. Atrophy with chronic small vessel white matter ischemic disease.    DG Chest 2 View 07/11/2016 1. Interval development of a trace right-sided effusion with associated right basilar opacities, atelectasis versus infiltrate. A follow-up chest radiograph in 3 to 4 weeks after treatment is recommended to ensure resolution. 2. Cardiomegaly without evidence of edema. 3.  Emphysema. (ICD10-J43.9) 4.  Aortic Atherosclerosis (ICD10-170.0)     PHYSICAL EXAM  HEENT-  Normocephalic, no lesions, without obvious abnormality.  Normal external eye and conjunctiva.  Normal TM's bilaterally.  Normal auditory canals and external ears. Normal external nose, mucus membranes and septum.  Normal pharynx. Cardiovascular- regular rate and  rhythm, S1, S2 normal, no murmur, click, rub or gallop, pulses palpable throughout   Lungs- chest clear, no wheezing, rales, normal symmetric air entry, Heart exam - S1, S2 normal, no murmur, no gallop, rate regular Abdomen- soft, non-tender; bowel sounds normal; no masses,  no organomegaly Extremities- less then 2 second capillary refill Lymph-no adenopathy palpable Musculoskeletal-no joint tenderness, deformity or swelling Skin-warm and dry, no hyperpigmentation, vitiligo, or suspicious lesions  Neurological Examination Mental  Status: Alert, oriented, thought content appropriate.  Speech fluent without evidence of aphasia.  Able to follow 3 step commands without difficulty. Cranial Nerves: II: Discs flat bilaterally; Visual fields grossly normal, pupils equal, round, reactive to light and accommodation III,IV, VI: ptosis not present, extra-ocular motions intact bilaterally V,VII: smile symmetric, facial light touch sensation normal bilaterally VIII: hearing normal bilaterally IX,X: uvula rises symmetrically XI: bilateral shoulder shrug XII: midline tongue extension Motor: Right :            Upper extremity   5/5                                                                              Left:     Upper extremity   5/5                       Lower extremity   5/5                                                                                                    Lower extremity   5/5 Tone and bulk:normal tone throughout; no atrophy noted Sensory: Pinprick and light touch intact throughout, bilaterally Deep Tendon Reflexes: 2+ and symmetric throughout Plantars: Right: downgoing                                                              Left: downgoing Cerebellar: normal finger-to-nose, normal rapid alternating movements and normal heel-to-shin test Gait: normal gait and station    ASSESSMENT/PLAN [name] is a 70 y.o. female with history of  hypertension, diabetes mellitus, previous stroke, migraine headaches, chronic kidney disease, bipolar disorder, hyperlipidemia, tobacco history, presenting with lightheadedness and blurred vision.  She did not receive IV t-PA due to mild deficits.  Stroke:  Non - Dominant infarct possibly embolic from an unknown source.  Resultant  Right side numbness  MRI - New punctate acute infarction in the left lateral thalamus. Right occipital cortical infarct from June 2017.  MRA - not performed.  Carotid Doppler - There is no obvious evidence of hemodynamically  significant internal  carotid artery stenosis  bilaterally. Vertebral arteries are patent with antegrade flow.  2D Echo - EF 60-65%. No cardiac source of emboli identified.  LDL - 45  HgbA1c pending  VTE prophylaxis - Lovenox Diet heart healthy/carb modified Room service appropriate? Yes; Fluid consistency: Thin  aspirin 81 mg daily and clopidogrel 75 mg daily prior to admission, now on aspirin 81 mg daily and clopidogrel 75 mg daily  Patient counseled to be compliant with her antithrombotic medications  Ongoing aggressive stroke risk factor management  Therapy recommendations:  Home health physical therapy recommended  Disposition: Pending  Hypertension  Stable  Permissive hypertension (OK if < 220/120) but gradually normalize in 5-7 days  Long-term BP goal normotensive  Hyperlipidemia  Home meds:  Lipitor 10 mg daily resumed in hospital  LDL 45, goal < 70  Continue statin at discharge  Diabetes  HgbA1c pending, goal < 7.0  Uncontrolled  Other Stroke Risk Factors  Advanced age  Cigarette smoker - advised to stop smoking   Hx stroke/TIA  Family hx stroke (Mother)  Migraines   Other Active Problems  Abnormal chest x-ray - follow-up recommended in 3-4 weeks  Anemia - hemoglobin 8.5 ; hematocrit 24.6  Chronic kidney disease - BUN 24 ; creatinine 1.46  Consider workup for occult malignancy and hypercoagulable state    Hospital day # 1  ATTENDING NOTE: Patient was seen and examined by me personally. Documentation reflects findings. The laboratory and radiographic studies reviewed by me. ROS completed by me personally and pertinent positives fully documented  Condition:  stable  Assessment and plan completed by me personally and fully documented above. Plans/Recommendations include:     Patient having hypoglycemia and primary team adjusting meds.  Stroke work-up complete except for A1c pending  Recommendations for ASA and statin above.   Would maximize statin therapy if tolerated  No further recommendations  SIGNED BY: Dr. Sula Soda     To contact Stroke Continuity provider, please refer to [email or web address]. After hours, contact General Neurology

## 2016-07-15 DIAGNOSIS — I5031 Acute diastolic (congestive) heart failure: Secondary | ICD-10-CM

## 2016-07-15 DIAGNOSIS — I6330 Cerebral infarction due to thrombosis of unspecified cerebral artery: Secondary | ICD-10-CM

## 2016-07-15 DIAGNOSIS — E11649 Type 2 diabetes mellitus with hypoglycemia without coma: Secondary | ICD-10-CM

## 2016-07-15 DIAGNOSIS — I6349 Cerebral infarction due to embolism of other cerebral artery: Secondary | ICD-10-CM

## 2016-07-15 LAB — GLUCOSE, CAPILLARY
GLUCOSE-CAPILLARY: 124 mg/dL — AB (ref 65–99)
GLUCOSE-CAPILLARY: 218 mg/dL — AB (ref 65–99)
GLUCOSE-CAPILLARY: 448 mg/dL — AB (ref 65–99)
Glucose-Capillary: 100 mg/dL — ABNORMAL HIGH (ref 65–99)
Glucose-Capillary: 224 mg/dL — ABNORMAL HIGH (ref 65–99)
Glucose-Capillary: 73 mg/dL (ref 65–99)
Glucose-Capillary: 65 mg/dL (ref 65–99)

## 2016-07-15 LAB — HEMOGLOBIN A1C
HEMOGLOBIN A1C: 7.2 % — AB (ref 4.8–5.6)
MEAN PLASMA GLUCOSE: 160 mg/dL

## 2016-07-15 MED ORDER — INSULIN ASPART 100 UNIT/ML ~~LOC~~ SOLN: 2.0000 [IU] | Freq: Three times a day (TID) | SUBCUTANEOUS | Status: DC

## 2016-07-15 MED ORDER — INSULIN ASPART 100 UNIT/ML ~~LOC~~ SOLN
20.0000 [IU] | Freq: Once | SUBCUTANEOUS | Status: AC
  Administered 2016-07-15: 20 [IU] via SUBCUTANEOUS

## 2016-07-15 MED ORDER — SODIUM CHLORIDE 0.9 % IV BOLUS (SEPSIS)
500.0000 mL | Freq: Once | INTRAVENOUS | Status: AC
  Administered 2016-07-15: 500 mL via INTRAVENOUS

## 2016-07-15 MED ORDER — INSULIN ASPART 100 UNIT/ML ~~LOC~~ SOLN
0.0000 [IU] | Freq: Three times a day (TID) | SUBCUTANEOUS | Status: DC
  Administered 2016-07-16: 15 [IU] via SUBCUTANEOUS
  Administered 2016-07-16: 3 [IU] via SUBCUTANEOUS
  Administered 2016-07-17: 11 [IU] via SUBCUTANEOUS
  Administered 2016-07-18: 15 [IU] via SUBCUTANEOUS

## 2016-07-15 MED ORDER — SODIUM CHLORIDE 0.9 % IV SOLN
INTRAVENOUS | Status: AC
  Administered 2016-07-15 (×2): via INTRAVENOUS

## 2016-07-15 NOTE — Progress Notes (Signed)
STROKE TEAM PROGRESS NOTE   SUBJECTIVE (INTERVAL HISTORY) Her family is not at the bedside. She admitted that she did not drink enough water at home due to long standing N/V on and off. Her Cre elevated at 1.46 and on IVF for hydration. She stated that she feels good lying in bed, on getting up she felt lightheadedness.    OBJECTIVE Temp:  [97.7 F (36.5 C)-99.3 F (37.4 C)] 98.7 F (37.1 C) (07/31 2157) Pulse Rate:  [66-82] 69 (07/31 2157) Cardiac Rhythm: Normal sinus rhythm (07/31 1900) Resp:  [18] 18 (07/31 2157) BP: (121-154)/(53-79) 141/70 (07/31 2157) SpO2:  [96 %-100 %] 99 % (07/31 2157) Weight:  [105 lb 1.6 oz (47.7 kg)-105 lb 12.8 oz (48 kg)] 105 lb 1.6 oz (47.7 kg) (07/31 0608)  CBC:   Recent Labs Lab 07/11/16 1600 07/11/16 1720 07/12/16 0650  WBC 10.7*  --  5.8  NEUTROABS 7.6  --   --   HGB 9.4* 9.9* 8.5*  HCT 27.7* 29.0* 24.6*  MCV 91.1  --  90.4  PLT 297  --  218    Basic Metabolic Panel:   Recent Labs Lab 07/13/16 0226 07/14/16 0547  NA 135 138  K 4.4 4.5  CL 103 104  CO2 27 29  GLUCOSE 273* 115*  BUN 15 24*  CREATININE 1.25* 1.46*  CALCIUM 8.1* 8.8*    Lipid Panel:     Component Value Date/Time   CHOL 185 07/13/2016 0225   TRIG 378 (H) 07/13/2016 0225   HDL 64 07/13/2016 0225   CHOLHDL 2.9 07/13/2016 0225   VLDL 76 (H) 07/13/2016 0225   LDLCALC 45 07/13/2016 0225   HgbA1c:  Lab Results  Component Value Date   HGBA1C 7.2 (H) 07/13/2016   Urine Drug Screen:     Component Value Date/Time   LABOPIA NONE DETECTED 05/06/2016 1136   COCAINSCRNUR NONE DETECTED 05/06/2016 1136   COCAINSCRNUR NEGATIVE 06/16/2012 1924   LABBENZ NONE DETECTED 05/06/2016 1136   AMPHETMU NONE DETECTED 05/06/2016 1136   THCU NONE DETECTED 05/06/2016 1136   LABBARB NONE DETECTED 05/06/2016 1136      IMAGING I have personally reviewed the radiological images below and agree with the radiology interpretations.  MRI Brain Wo Contrast 07/12/2016 New  punctate acute infarction in the left lateral thalamus. No other acute infarction. Old ischemic changes as seen previously, including a right occipital cortical infarction that was acute in June of this year.  CT head without contrast 07/11/2016 Stable.  No acute intracranial abnormality. Atrophy with chronic small vessel white matter ischemic disease.  CUS - There is no obvious evidence of hemodynamically significant internal carotid artery stenosis bilaterally. Vertebral arteries are patent with antegrade flow.  TTE - Left ventricle: The cavity size was normal. There was mild   concentric hypertrophy. Systolic function was normal. The   estimated ejection fraction was in the range of 60% to 65%. Wall   motion was normal; there were no regional wall motion   abnormalities. Doppler parameters are consistent with abnormal   left ventricular relaxation (grade 1 diastolic dysfunction).   Doppler parameters are consistent with high ventricular filling   pressure. - Aortic valve: Transvalvular velocity was within the normal range.   There was no stenosis. There was no regurgitation. - Mitral valve: Calcified annulus. Transvalvular velocity was   within the normal range. There was no evidence for stenosis.   There was trivial regurgitation. - Right ventricle: The cavity size was normal. Wall thickness was  normal. Systolic function was normal. - Tricuspid valve: There was mild regurgitation. - Pulmonary arteries: Systolic pressure was within the normal   range. PA peak pressure: 25 mm Hg (S).    PHYSICAL EXAM  HEENT-  Normocephalic, no lesions, without obvious abnormality.  Normal external eye and conjunctiva.  Normal TM's bilaterally.  Normal auditory canals and external ears. Normal external nose, mucus membranes and septum.  Normal pharynx. Cardiovascular- regular rate and rhythm, S1, S2 normal, no murmur, click, rub or gallop, pulses palpable throughout   Lungs- chest clear, no  wheezing, rales, normal symmetric air entry, Heart exam - S1, S2 normal, no murmur, no gallop, rate regular Abdomen- soft, non-tender; bowel sounds normal; no masses,  no organomegaly Extremities- less then 2 second capillary refill Lymph-no adenopathy palpable Musculoskeletal-no joint tenderness, deformity or swelling Skin-warm and dry, no hyperpigmentation, vitiligo, or suspicious lesions  Neurological Examination Mental Status: Alert, oriented, thought content appropriate.  Speech fluent without evidence of aphasia.  Able to follow 3 step commands without difficulty. Cranial Nerves: II: Discs flat bilaterally; Visual fields grossly normal, pupils equal, round, reactive to light and accommodation III,IV, VI: ptosis not present, extra-ocular motions intact bilaterally V,VII: smile symmetric, facial light touch sensation normal bilaterally VIII: hearing normal bilaterally IX,X: uvula rises symmetrically XI: bilateral shoulder shrug XII: midline tongue extension Motor: Right :            Upper extremity   5/5                                                                              Left:     Upper extremity   5/5                       Lower extremity   5/5                                                                                                    Lower extremity   5/5 Tone and bulk:normal tone throughout; no atrophy noted Sensory: Pinprick and light touch intact throughout, bilaterally Deep Tendon Reflexes: 2+ and symmetric throughout Plantars: Right: downgoing                                                              Left: downgoing Cerebellar: normal finger-to-nose, normal rapid alternating movements and normal heel-to-shin test Gait: normal gait and station    ASSESSMENT/PLAN [name] is a 70 y.o. female with history of  hypertension, diabetes mellitus, previous stroke, migraine headaches, chronic kidney disease, bipolar disorder, hyperlipidemia,  tobacco  history, presenting with lightheadedness and blurred vision on standing up.  She did not receive IV t-PA due to mild deficits.  Stroke:  Left lateral thalamus infarct likely small vessel disease.  Resultant  Deficit resolved except standing up felt lightheadedness  MRI - New punctate acute infarction in the left lateral thalamus. Old right occipital cortical infarct from June 2017.  Carotid Doppler - pvious evidence of hemodynamically significant internal carotid artery stenosis  bilaterally.  2D Echo - EF 60-65%. No cardiac source of emboli identified.  Due to recurrent new stroke, will recommend 30 day cardiac vent monitoring to rule out afib  LDL - 45  HgbA1c 7.2  VTE prophylaxis - Lovenox Diet heart healthy/carb modified Room service appropriate? Yes; Fluid consistency: Thin  aspirin 81 mg daily and clopidogrel 75 mg daily prior to admission, now on aspirin 81 mg daily and clopidogrel 75 mg daily.  Patient counseled to be compliant with her antithrombotic medications  Ongoing aggressive stroke risk factor management  Therapy recommendations:  Home health physical therapy recommended  Disposition: Pending  Dehydration   Poor oral intake lately due to N/V  Elevated Cre  On IVF  Standing up lightheadedness   Hypertension  Stable  Permissive hypertension (OK if < 220/120) but gradually normalize in 5-7 days  Long-term BP goal normotensive  Hyperlipidemia  Home meds:  Lipitor 10 mg daily resumed in hospital  LDL 45, goal < 70  Continue statin at discharge  Diabetes  HgbA1c pending, goal < 7.0  Uncontrolled  Tobacco abuse  Current smoker  Smoking cessation counseling provided  Nicotine patch provided  Pt is willing to quit  Other Stroke Risk Factors  Advanced age  Hx stroke/TIA - 05/2016 R   Family hx stroke (Mother)  Migraines  Other Active Problems  Anemia - hemoglobin 8.5 ; hematocrit 24.6  Chronic kidney disease - BUN 24 ;  creatinine 1.46   Hospital day # 2  Neurology will sign off. Please call with questions. Pt will follow up with Dr. Darrol Angel NP at [facility] in about 2 months. Thanks for the consult.  Marvel Plan, MD PhD Stroke Neurology 07/16/2016 12:20 AM      To contact Stroke Continuity provider, please refer to [email or web address]. After hours, contact General Neurology

## 2016-07-15 NOTE — Care Management Important Message (Signed)
Important Message  Patient Details  Name: Cassandra [name] MRN: [id number] Date of Birth: June 11, 1946   Medicare Important Message Given:  Yes    Bernadette Hoit 07/15/2016, 3:32 PM

## 2016-07-15 NOTE — Progress Notes (Addendum)
PROGRESS NOTE                                                                                                                                                                                                             Patient Demographics:    Cassandra [name], is a 70 y.o. female, DOB - 1946/12/03, VHQ:[id number]  Admit date - 07/11/2016   Admitting Physician Bobette Mo, MD  Outpatient Primary MD for the patient is Irving Copas, MD  LOS - 2  Outpatient Specialists:None  Chief Complaint  Patient presents with  . Dizziness  . Shortness of Breath       Brief Narrative  70 year old female with history of anxiety, bipolar disorder, depression, since hypertension, type 2 diabetes mellitus, (admitted recently with DKA) hypothyroidism, BPPV, GERD, history of CVA, history of alcohol abuse, tobacco use and B12 deficiency presented to the ED with dizziness and blurred vision since one day which is associated with some dyspnea and weakness and increased pitting edema of the legs. Patient first went to her PCP office and complained of feeling dizzy when trying to ambulate and also had some blurred vision. She describes her symptoms to be similar to her BPPV which usually gets better shortly after taking meclizine or sitting down. However the symptoms were prolonged and blurry vision was new. She felt like she was going to fall and had unsteady gait as well. Patient found to be in mild diastolic CHF and admitted to hospitalist service.   Subjective:   Appearance of headache. Having recurrent hypoglycemic episodes.   Assessment  & Plan :    Principal Problem: Acute ischemic stroke Left thalamic stroke on MRI.Marland Kitchen  Pt is already on ASA and plavix. Carotid US without significant stenosis.2-D echo without cardiac source of emboli. Home health with supervision per PT. Lipid panel with elevated TAG. Continue statin. Appreciate  Stroke team follow-up. Recommends 30 day outpatient Holter monitoring.     Acute diastolic CHF (congestive heart failure) (HCC) Symptoms improved on low dose IV lasix bid. However patient now orthostatic and dehydrated. Disease discontinue blood pressure medication and gentle hydration.  2-D echo with normal EF and grade 1 diastolic dysfunction. Monitor strict I/O and daily weight. Continue aspirin and statin.   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 Now having hypoglycemic episodes with poor by mouth intake. Reduced Lantus dose to 5 units daily. Added back low-dose pre-meal aspart as CBG elevated    Acute on CKD (chronic kidney disease) stage 3, GFR 30-59 ml/min Secondary to overdiuresis. Discontinue Lasix and gentle hydration.  Orthostatic hypotension Possibly due to dehydration with overdiuresis. Hold blood pressure medications diuretic and gentle hydration.    Hypothyroidism Synthroid dose increased (recently checked TSH of 15 with low normal free T4)  BPPV Continue meclizine.   History of stroke Continue Plavix and statin.  Anxiety Continue home dose Xanax.  Headache/dizziness Secondary to orthostasis. When necessary tramadol.  Code Status : Full code  Family Communication  : None at bedside  Disposition Plan  : Home with home health in 24 hours if CBG stable and orthostasis resolved  Barriers For Discharge : Orthostatic  Consults  :  None  Procedures  :  Head CT MRI brain  DVT Prophylaxis  :  Lovenox   Lab Results  Component Value Date   PLT 218 07/12/2016    Antibiotics  :    Anti-infectives    None        Objective:   Vitals:   07/15/16 0500 07/15/16 0608 07/15/16 0926 07/15/16 1420  BP:  (!) 127/53  140/78  Pulse:  68  66  Resp:  18 18 18   Temp:  98.2 F (36.8 C) 98 F (36.7 C) 98.7 F (37.1 C)  TempSrc:  Oral Oral Oral  SpO2:  96% 100%   Weight:  48 kg (105 lb 12.8 oz) 47.7 kg (105 lb 1.6 oz)    Height:        Wt Readings from Last 3 Encounters:  07/15/16 47.7 kg (105 lb 1.6 oz)  06/29/16 52.3 kg (115 lb 6.4 oz)  05/19/16 53.4 kg (117 lb 11.6 oz)    No intake or output data in the 24 hours ending 07/15/16 1531   Physical Exam  Gen: Appears tired HEENT: moist mucosa, supple neck Chest: Clear bilaterally CVS: N S1&S2, no murmurs, GI: soft, NT, ND, BS+ Musculoskeletal: warm, no edema     Data Review:    CBC  Recent Labs Lab 07/11/16 1600 07/11/16 1720 07/12/16 0650  WBC 10.7*  --  5.8  HGB 9.4* 9.9* 8.5*  HCT 27.7* 29.0* 24.6*  PLT 297  --  218  MCV 91.1  --  90.4  MCH 30.9  --  31.3  MCHC 33.9  --  34.6  RDW 13.6  --  13.9  LYMPHSABS 2.3  --   --   MONOABS 0.5  --   --   EOSABS 0.4  --   --   BASOSABS 0.1  --   --     Chemistries   Recent Labs Lab 07/11/16 1600 07/11/16 1720 07/12/16 0650 07/13/16 0226 07/14/16 0547  NA 138 139 139 135 138  K 4.5 4.6 3.6 4.4 4.5  CL 106 107 104 103 104  CO2 23  --  25 27 29   GLUCOSE 125* 121* 138* 273* 115*  BUN 21* 24* 15 15 24*  CREATININE 1.40* 1.30* 1.15* 1.25* 1.46*  CALCIUM 9.3  --  8.5* 8.1* 8.8*  AST 20  --   --   --   --   ALT 14  --   --   --   --   ALKPHOS 64  --   --   --   --   BILITOT 0.4  --   --   --   --    ------------------------------------------------------------------------------------------------------------------  Recent Labs  07/13/16 0225  CHOL 185  HDL 64  LDLCALC 45  TRIG 378*  CHOLHDL 2.9    Lab Results  Component Value Date   HGBA1C 7.2 (H) 07/13/2016   ------------------------------------------------------------------------------------------------------------------ No results for input(s): TSH, T4TOTAL, T3FREE, THYROIDAB in the last 72 hours.  Invalid input(s): FREET3 ------------------------------------------------------------------------------------------------------------------ No results for input(s):  VITAMINB12, FOLATE, FERRITIN, TIBC, IRON, RETICCTPCT in the last 72 hours.  Coagulation profile  Recent Labs Lab 07/11/16 1600  INR 0.90    No results for input(s): DDIMER in the last 72 hours.  Cardiac Enzymes  Recent Labs Lab 07/12/16 0156 07/12/16 0650  TROPONINI <0.03 <0.03   ------------------------------------------------------------------------------------------------------------------    Component Value Date/Time   BNP 469.1 (H) 07/11/2016 1600    Inpatient Medications  Scheduled Meds: .  stroke: mapping our early stages of recovery book   Does not apply Once  . aspirin EC  81 mg Oral Daily  . atorvastatin  10 mg Oral QHS  . clopidogrel  75 mg Oral Daily  . cyanocobalamin  1,000 mcg Intramuscular Q30 days  . enoxaparin (LOVENOX) injection  30 mg Subcutaneous QHS  . escitalopram  10 mg Oral QHS  . feeding supplement (GLUCERNA SHAKE)  237 mL Oral TID BM  . folic acid  1 mg Oral Daily  . insulin aspart  0-5 Units Subcutaneous QHS  . insulin aspart  0-9 Units Subcutaneous TID WC  . insulin detemir  5 Units Subcutaneous QHS  . iron polysaccharides  150 mg Oral BID  . levothyroxine  75 mcg Oral QAC breakfast  . loratadine  10 mg Oral Daily  . pantoprazole  40 mg Oral BID  . polyethylene glycol  17 g Oral QODAY  . saccharomyces boulardii  250 mg Oral BID  . sodium chloride  1 drop Both Eyes Daily  . sodium chloride flush  3 mL Intravenous Q12H  . sodium chloride flush  3 mL Intravenous Q12H  . thiamine  100 mg Oral Daily   Continuous Infusions: . sodium chloride 75 mL/hr at 07/15/16 1145   PRN Meds:.sodium chloride, acetaminophen, alum & mag hydroxide-simeth, LORazepam, meclizine, ondansetron (ZOFRAN) IV, sodium chloride flush, traMADol  Micro Results No results found for this or any previous visit (from the past 240 hour(s)).  Radiology Reports Dg Chest 2 View  Result Date: 07/11/2016 CLINICAL DATA:  Bilateral lower extremity swelling for the past 2  days. EXAM: CHEST  2 VIEW COMPARISON:  06/22/2016; 05/10/2016; chest CT - 05/04/2016 FINDINGS: Grossly unchanged borderline enlarged cardiac silhouette. Normal mediastinal contours. Atherosclerotic plaque when the thoracic aorta. The lungs appear hyperexpanded with flattening of the bilateral hemidiaphragms mild diffuse slightly nodular thickening of the pulmonary interstitium. Interval development of a trace right-sided effusion with associated right basilar heterogeneous opacities. No definite evidence of edema. No acute osseus abnormalities. Post lower cervical ACDF, incompletely evaluated. IMPRESSION: 1. Interval development of a trace right-sided effusion with associated right basilar opacities, atelectasis versus infiltrate. A follow-up chest radiograph in 3 to 4 weeks after treatment is recommended to ensure resolution. 2. Cardiomegaly without evidence of edema. 3.  Emphysema. (ICD10-J43.9) 4.  Aortic Atherosclerosis (ICD10-170.0) Electronically Signed   By: Simonne Come M.D.   On: 07/11/2016 21:23  Ct Head Wo Contrast  Result Date: 07/11/2016 CLINICAL DATA:  Weakness with dizziness and blurred vision. Headache. EXAM: CT HEAD WITHOUT CONTRAST TECHNIQUE: Contiguous axial images were obtained from the base of the skull through the vertex without intravenous contrast. COMPARISON:  06/22/2016. FINDINGS: There  is no evidence for acute hemorrhage, hydrocephalus, mass lesion, or abnormal extra-axial fluid collection. No definite CT evidence for acute infarction. Diffuse loss of parenchymal volume is consistent with atrophy. Patchy low attenuation in the deep hemispheric and periventricular white matter is nonspecific, but likely reflects chronic microvascular ischemic demyelination. The visualized paranasal sinuses and mastoid air cells are clear. IMPRESSION: Stable.  No acute intracranial abnormality. Atrophy with chronic small vessel white matter ischemic disease. Electronically Signed   By: Kennith Center M.D.    On: 07/11/2016 18:00  Ct Head Wo Contrast  Result Date: 06/22/2016 CLINICAL DATA:  70 year old female with acute encephalopathy. EXAM: CT HEAD WITHOUT CONTRAST TECHNIQUE: Contiguous axial images were obtained from the base of the skull through the vertex without intravenous contrast. COMPARISON:  Head CT 05/06/2016. FINDINGS: Patchy and confluent areas of decreased attenuation are noted throughout the deep and periventricular white matter of the cerebral hemispheres bilaterally, compatible with chronic microvascular ischemic disease. No acute intracranial abnormalities. Specifically, no evidence of acute intracranial hemorrhage, no definite findings of acute/subacute cerebral ischemia, no mass, mass effect, hydrocephalus or abnormal intra or extra-axial fluid collections. Visualized paranasal sinuses and mastoids are well pneumatized. No acute displaced skull fractures are identified. IMPRESSION: 1. No acute intracranial abnormalities. 2. Chronic microvascular ischemic changes in cerebral white matter redemonstrated, as above. Electronically Signed   By: Trudie Reed M.D.   On: 06/22/2016 22:30   Mr Brain Wo Contrast  Result Date: 07/12/2016 CLINICAL DATA:  Worsening dizziness. EXAM: MRI HEAD WITHOUT CONTRAST TECHNIQUE: Multiplanar, multiecho pulse sequences of the brain and surrounding structures were obtained without intravenous contrast. COMPARISON:  CT 07/11/2016.  MRI 05/16/2016. FINDINGS: Diffusion imaging shows a punctate acute infarction in the lateral thalamus on the left. No other acute infarction. Chronic small-vessel ischemic changes affect the pons. No focal cerebellar insult. Old small vessel infarctions affect the thalami and are present throughout the hemispheric deep white matter. Small old right occipital cortical infarction which was acute in June. No mass lesion, hemorrhage, hydrocephalus or extra-axial collection. No pituitary mass. No inflammatory sinus disease. No skull or  skullbase lesion. IMPRESSION: New punctate acute infarction in the left lateral thalamus. No other acute infarction. Old ischemic changes as seen previously, including a right occipital cortical infarction that was acute in June of this year. Electronically Signed   By: Paulina Fusi M.D.   On: 07/12/2016 12:05  Dg Chest Portable 1 View  Result Date: 06/22/2016 CLINICAL DATA:  Cough.  History of pneumonia.  Smoker. EXAM: PORTABLE CHEST 1 VIEW COMPARISON:  05/10/2016 chest radiograph. FINDINGS: Surgical hardware from ACDF overlies the lower cervical spine. Stable cardiomediastinal silhouette with normal heart size. No pneumothorax. No pleural effusion. No pulmonary edema. No acute consolidative airspace disease. IMPRESSION: No active disease. Electronically Signed   By: Delbert Phenix M.D.   On: 06/22/2016 15:40    Time Spent in minutes  25   Eddie North M.D on 07/15/2016 at 3:31 PM  Between 7am to 7pm - Pager - [phone]  After 7pm go to [email or web address] - password [email or web address]  [facility] -  Office  [phone]

## 2016-07-15 NOTE — Progress Notes (Signed)
Patient had a Blood glucose of 448, Call palced to Dr. Gonzella Lex. Advised to give the patient 20 Units of Novolog.

## 2016-07-15 NOTE — Care Management Note (Signed)
Case Management Note  Patient Details  Name: Cassandra [name] MRN: [id number] Date of Birth: September 29, 1946  Subjective/Objective:    Pt admitted with CHF. Pt also positive for CVA. She is from home with her spouse and was active with [facility] prior to admission.                Action/Plan: PT recommending HH services. CM following for discharge needs.   Expected Discharge Date:                  Expected Discharge Plan:  Home w Home Health Services  In-House Referral:     Discharge planning Services     Post Acute Care Choice:    Choice offered to:     DME Arranged:    DME Agency:     HH Arranged:    HH Agency:     Status of Service:  In process, will continue to follow  If discussed at Long Length of Stay Meetings, dates discussed:    Additional Comments:  Kermit Balo, RN 07/15/2016, 3:50 PM

## 2016-07-15 NOTE — Progress Notes (Signed)
Physical Therapy Treatment Patient Details Name: Cassandra [name] MRN: [id number] DOB: [birth year] Today's Date: 07/15/2016    History of Present Illness Patient is a 70 yo female admitted 07/11/16 with dizziness and SOB.  Patient with CHF, and dizziness continued.  Patient developed blurred vision.  MRI showed Lt thalamic infarct.   PMH:  HTN, DM, CKD, anxiety, depression, BPPV, bipolar disorder, mult CVA's    PT Comments    Pt progressing very slowly with mobility due to continued dizziness. When first went to see pt, she refused due to dizziness and HA but had just received meclizine. An hour later, she did feel up to ambulating within room but still did not feel that she could tolerate ambulation in hallway. PT will continue to follow.   Follow Up Recommendations  Home health PT;Supervision for mobility/OOB     Equipment Recommendations  None recommended by PT    Recommendations for Other Services OT consult     Precautions / Restrictions Precautions Precautions: Fall Restrictions Weight Bearing Restrictions: No    Mobility  Bed Mobility Overal bed mobility: Modified Independent Bed Mobility: Supine to Sit;Sit to Supine     Supine to sit: Modified independent (Device/Increase time) Sit to supine: Modified independent (Device/Increase time)   General bed mobility comments: pt in and out of bed without physical assist, takes increased time due to dizziness. Discussed gaze stabilization with mobility and pt verbalized understanding  Transfers Overall transfer level: Needs assistance Equipment used: None Transfers: Sit to/from Stand Sit to Stand: Min guard;Min assist         General transfer comment: min-guard from bed, min A to and from low toilet. HHA used for stability  Ambulation/Gait Ambulation/Gait assistance: Min assist Ambulation Distance (Feet): 20 Feet (10' x2) Assistive device: 1 person hand held assist Gait Pattern/deviations: Step-through  pattern;Shuffle;Trunk flexed Gait velocity: decreased Gait velocity interpretation: <1.8 ft/sec, indicative of risk for recurrent falls General Gait Details: pt did not feel she could tolerate further gait today due to dizziness, min HHA for safety   Stairs            Wheelchair Mobility    Modified Rankin (Stroke Patients Only) Modified Rankin (Stroke Patients Only) Pre-Morbid Rankin Score: Slight disability Modified Rankin: Moderately severe disability     Balance Overall balance assessment: Needs assistance Sitting-balance support: Feet supported;No upper extremity supported Sitting balance-Leahy Scale: Good     Standing balance support: Single extremity supported Standing balance-Leahy Scale: Fair Standing balance comment: pt ca stand without UE support but cannot reach out of BOS                    Cognition Arousal/Alertness: Awake/alert Behavior During Therapy: Impulsive Overall Cognitive Status: No family/caregiver present to determine baseline cognitive functioning       Memory: Decreased short-term memory (thinks she hasn't been getting her meclizine here but she ha)              Exercises      General Comments General comments (skin integrity, edema, etc.): instructed pt in bed level exercises for strengthening when she feels that she cannot ambulate due to dizziness. Specifically bridges for large muscle recruitment      Pertinent Vitals/Pain Pain Assessment: Faces Faces Pain Scale: Hurts little more Pain Location: head Pain Descriptors / Indicators: Aching Pain Intervention(s): Monitored during session    Home Living  Prior Function            PT Goals (current goals can now be found in the care plan section) Acute Rehab PT Goals Patient Stated Goal: To go home PT Goal Formulation: With patient/family Time For Goal Achievement: 07/19/16 Potential to Achieve Goals: Good Progress towards PT goals:  Not progressing toward goals - comment    Frequency  Min 4X/week    PT Plan Current plan remains appropriate    Co-evaluation             End of Session Equipment Utilized During Treatment: Gait belt Activity Tolerance: Other (comment) (limiteed bu dizziness) Patient left: in bed;with bed alarm set;with call bell/phone within reach     Time: 1236-1248 PT Time Calculation (min) (ACUTE ONLY): 12 min  Charges:  $Gait Training: 8-22 mins                    G Codes:     Lyanne Co, PT  Acute Rehab Services  [phone]  Lyanne Co 07/15/2016, 1:42 PM

## 2016-07-15 NOTE — Progress Notes (Addendum)
Inpatient Diabetes Program Recommendations  AACE/ADA: New Consensus Statement on Inpatient Glycemic Control (2015)  Target Ranges:  Prepandial:   less than 140 mg/dL      Peak postprandial:   less than 180 mg/dL (1-2 hours)      Critically ill patients:  140 - 180 mg/dL   Lab Results  Component Value Date   GLUCAP 224 (H) 07/15/2016   HGBA1C 7.2 (H) 07/13/2016    Review of Glycemic Control:  Results for Cassandra, [name] (MRN [id number]) as of 07/15/2016 13:46  Ref. Range 07/14/2016 16:34 07/14/2016 19:29 07/14/2016 21:33 07/15/2016 06:20 07/15/2016 11:08  Glucose-Capillary Latest Ref Range: 65 - 99 mg/dL 962 (H) 952 (H) 841 (H) 100 (H) 224 (H)    Diabetes history: Type 1 diabetes Outpatient Diabetes medications: Lantus 10 units daily, Humalog 4 units tid with meals Current orders for Inpatient glycemic control:  Levemir 5 units daily, Novolog sensitive tid with meals and HS  Inpatient Diabetes Program Recommendations:    Please consider adding low dose Novolog meal coverage.  Consider Novolog 2 units tid with meals (Hold if patient NPO or eats less than 50%).  Patient would benefit from follow-up with endocrinologist at d/c due to sensitivity to insulin doses.  Upon d/c from hospital on 06/28/16, she was instructed to call Dr. Lucianne Muss for appointment.   Thanks, Beryl Meager, RN, BC-ADM Inpatient Diabetes Coordinator Pager [phone] (8a-5p)

## 2016-07-16 ENCOUNTER — Other Ambulatory Visit: Payer: Self-pay | Admitting: Physician Assistant

## 2016-07-16 DIAGNOSIS — Z8673 Personal history of transient ischemic attack (TIA), and cerebral infarction without residual deficits: Secondary | ICD-10-CM

## 2016-07-16 DIAGNOSIS — I509 Heart failure, unspecified: Secondary | ICD-10-CM

## 2016-07-16 DIAGNOSIS — E860 Dehydration: Secondary | ICD-10-CM

## 2016-07-16 DIAGNOSIS — I951 Orthostatic hypotension: Secondary | ICD-10-CM

## 2016-07-16 HISTORY — DX: Heart failure, unspecified: I50.9

## 2016-07-16 LAB — GLUCOSE, CAPILLARY
GLUCOSE-CAPILLARY: 442 mg/dL — AB (ref 65–99)
GLUCOSE-CAPILLARY: 406 mg/dL — AB (ref 65–99)
GLUCOSE-CAPILLARY: 76 mg/dL (ref 65–99)
GLUCOSE-CAPILLARY: 125 mg/dL — AB (ref 65–99)
Glucose-Capillary: 195 mg/dL — ABNORMAL HIGH (ref 65–99)

## 2016-07-16 LAB — BASIC METABOLIC PANEL WITH GFR
Anion gap: 7 (ref 5–15)
BUN: 25 mg/dL — ABNORMAL HIGH (ref 6–20)
CO2: 24 mmol/L (ref 22–32)
Calcium: 8 mg/dL — ABNORMAL LOW (ref 8.9–10.3)
Chloride: 103 mmol/L (ref 101–111)
Creatinine, Ser: 1.25 mg/dL — ABNORMAL HIGH (ref 0.44–1.00)
GFR calc Af Amer: 50 mL/min — ABNORMAL LOW
GFR calc non Af Amer: 43 mL/min — ABNORMAL LOW
Glucose, Bld: 400 mg/dL — ABNORMAL HIGH (ref 65–99)
Potassium: 4.8 mmol/L (ref 3.5–5.1)
Sodium: 134 mmol/L — ABNORMAL LOW (ref 135–145)

## 2016-07-16 MED ORDER — INSULIN DETEMIR 100 UNIT/ML ~~LOC~~ SOLN
7.0000 [IU] | Freq: Every day | SUBCUTANEOUS | Status: DC
  Administered 2016-07-16 – 2016-07-17 (×2): 7 [IU] via SUBCUTANEOUS
  Filled 2016-07-16 (×3): qty 0.07

## 2016-07-16 MED ORDER — INSULIN ASPART 100 UNIT/ML ~~LOC~~ SOLN: 5.0000 [IU] | Freq: Once | SUBCUTANEOUS | Status: DC

## 2016-07-16 MED ORDER — INSULIN DETEMIR 100 UNIT/ML ~~LOC~~ SOLN
8.0000 [IU] | Freq: Every day | SUBCUTANEOUS | Status: DC
  Filled 2016-07-16: qty 0.08

## 2016-07-16 MED ORDER — INSULIN ASPART 100 UNIT/ML ~~LOC~~ SOLN
2.0000 [IU] | Freq: Three times a day (TID) | SUBCUTANEOUS | Status: DC
  Administered 2016-07-16 – 2016-07-17 (×2): 2 [IU] via SUBCUTANEOUS

## 2016-07-16 MED ORDER — INSULIN ASPART 100 UNIT/ML ~~LOC~~ SOLN
4.0000 [IU] | Freq: Three times a day (TID) | SUBCUTANEOUS | Status: DC
Start: 2016-07-16 — End: 2016-07-16
  Administered 2016-07-16: 4 [IU] via SUBCUTANEOUS

## 2016-07-16 NOTE — Progress Notes (Signed)
Trish Trent asked me to place order for event monitor as requested by neuro. She says the office will call the pt to arrange appt for pickup. Dayna Dunn PA-C 

## 2016-07-16 NOTE — Progress Notes (Addendum)
Inpatient Diabetes Program Recommendations  AACE/ADA: New Consensus Statement on Inpatient Glycemic Control (2015)  Target Ranges:  Prepandial:   less than 140 mg/dL      Peak postprandial:   less than 180 mg/dL (1-2 hours)      Critically ill patients:  140 - 180 mg/dL   Lab Results  Component Value Date   GLUCAP 76 07/16/2016   HGBA1C 7.2 (H) 07/13/2016    Review of Glycemic Control:  Results for Cassandra [name], Cassandra [name] (MRN [id number]) as of 07/16/2016 13:47  Ref. Range 07/15/2016 16:47 07/15/2016 19:50 07/15/2016 21:37 07/15/2016 22:48 07/15/2016 23:43 07/16/2016 06:06 07/16/2016 07:33 07/16/2016 11:21  Glucose-Capillary Latest Ref Range: 65 - 99 mg/dL 177 (H) 116 (H) 73 65 579 (H) 442 (H) 406 (H) 76   Diabetes history: Type 1  Outpatient Diabetes medications: Lantus 10 units q HS, Humalog 4 units tid with meals Current orders for Inpatient glycemic control:  Levemir 8 units q HS, Moderate tid with meals and HS  Inpatient Diabetes Program Recommendations:    Please decrease Novolog correction to sensitive tid with meals.  Also consider adding Novolog meal coverage 2 units tid with meals-Hold if patient eats less than 50%.  Note that patient did not receive Levemir 5 units last evening per documentation.  This likely influenced high fasting CBG this morning. Further blood sugars tend to decrease with large doses of Novolog.  Will inform MD.  Patient reports wide fluctuations in blood sugars at home with frequent lows.  Please consider endocrinology follow-up after discharge.  Thanks, Beryl Meager, RN, BC-ADM Inpatient Diabetes Coordinator Pager [phone] (8a-5p)

## 2016-07-16 NOTE — Progress Notes (Signed)
Physical Therapy Treatment Patient Details Name: Cassandra [name] MRN: [id number] DOB: 07-17-1946 Today's Date: 07/16/2016    History of Present Illness Patient is a 70 yo female admitted 07/11/16 with dizziness and SOB.  Patient with CHF, and dizziness continued.  Patient developed blurred vision.  MRI showed Lt thalamic infarct.   PMH:  HTN, DM, CKD, anxiety, depression, BPPV, bipolar disorder, mult CVA's    PT Comments    Patient continues to c/o dizziness that she reported has persisted for the last 6 months. Pt reported that she has had vertigo for "a long time" and has had several falls at home due to dizziness. Pt may benefit from vestibular eval or vestibular follow-up at [facility] to address chronic dizziness. Pt also presented with blurred vision and nausea without emesis. Recommending [facility] for further skilled PT services to maximize independence and safety with mobility.   Follow Up Recommendations  SNF;Supervision for mobility/OOB     Equipment Recommendations  Other (comment) (TBA)    Recommendations for Other Services OT consult     Precautions / Restrictions Precautions Precautions: Fall Restrictions Weight Bearing Restrictions: No    Mobility  Bed Mobility Overal bed mobility: Modified Independent Bed Mobility: Supine to Sit     Supine to sit: Modified independent (Device/Increase time)        Transfers Overall transfer level: Needs assistance Equipment used: None Transfers: Sit to/from Stand Sit to Stand: Min guard         General transfer comment: min guard for safety  Ambulation/Gait Ambulation/Gait assistance: Min guard;Min assist Ambulation Distance (Feet): 120 Feet Assistive device: Rolling walker (2 wheeled) Gait Pattern/deviations: Step-through pattern;Decreased stride length;Drifts right/left Gait velocity: decreased   General Gait Details: cues for proximity of RW and assis at times for management of RW as pt had tendency to drift to  the L side   Stairs            Wheelchair Mobility    Modified Rankin (Stroke Patients Only) Modified Rankin (Stroke Patients Only) Pre-Morbid Rankin Score: Slight disability Modified Rankin: Moderately severe disability     Balance     Sitting balance-Leahy Scale: Good       Standing balance-Leahy Scale: Fair                      Cognition Arousal/Alertness: Awake/alert Behavior During Therapy: WFL for tasks assessed/performed Overall Cognitive Status: No family/caregiver present to determine baseline cognitive functioning       Memory:  (thinks she hasn't been getting her meclizine here but she ha)              Exercises      General Comments General comments (skin integrity, edema, etc.): pt continues to have blurred vision and nausea without emesis; pt c/o dizziness for the last 6 months and reported that she has had vertigo for "a long time"       Pertinent Vitals/Pain Pain Assessment: No/denies pain    Home Living                      Prior Function            PT Goals (current goals can now be found in the care plan section) Acute Rehab PT Goals Patient Stated Goal: figure out why she is dizzy PT Goal Formulation: With patient/family Time For Goal Achievement: 07/19/16 Potential to Achieve Goals: Good Progress towards PT goals: Progressing toward goals    Frequency  Min 4X/week    PT Plan Discharge plan needs to be updated    Co-evaluation             End of Session Equipment Utilized During Treatment: Gait belt Activity Tolerance: Other (comment) (limiteed bu dizziness) Patient left: with call bell/phone within reach;in chair;with chair alarm set     Time: 0865-7846 PT Time Calculation (min) (ACUTE ONLY): 35 min  Charges:  $Gait Training: 8-22 mins $Therapeutic Activity: 8-22 mins                    G Codes:      Derek Mound, PTA Pager: [phone]  07/16/2016, 12:30  PM

## 2016-07-16 NOTE — Progress Notes (Signed)
Hypoglycemic Event  CBG: 65  Treatment: 15 GM carbohydrate snack  Symptoms: Shaky  Follow-up CBG: Time:2343 CBG Result:124  Possible Reasons for Event: Medication regimen  Comments/MD notified:    Cassandra [name]

## 2016-07-16 NOTE — Progress Notes (Signed)
PROGRESS NOTE                                                                                                                                                                                                             Patient Demographics:    Cassandra [name], is a 70 y.o. female, DOB - 1946/09/13, ZOX:[id number]  Admit date - 07/11/2016   Admitting Physician Bobette Mo, MD  Outpatient Primary MD for the patient is Irving Copas, MD  LOS - 3  Outpatient Specialists:None  Chief Complaint  Patient presents with  . Dizziness  . Shortness of Breath       Brief Narrative  70 year old female with history of anxiety, bipolar disorder, depression, since hypertension, type 2 diabetes mellitus, (admitted recently with DKA) hypothyroidism, BPPV, GERD, history of CVA, history of alcohol abuse, tobacco use and B12 deficiency presented to the ED with dizziness and blurred vision since one day which is associated with some dyspnea and weakness and increased pitting edema of the legs. Patient first went to her PCP office and complained of feeling dizzy when trying to ambulate and also had some blurred vision. She describes her symptoms to be similar to her BPPV which usually gets better shortly after taking meclizine or sitting down. However the symptoms were prolonged and blurry vision was new. She felt like she was going to fall and had unsteady gait as well. Patient found to be in mild diastolic CHF and admitted to hospitalist service.   Subjective:   Complains of being dizzy again. CBG elevated to 400s. (Apparently did not receive her nighttime long-acting insulin coverage for reasons unclear)   Assessment  & Plan :    Principal Problem: Acute ischemic stroke Left thalamic stroke on MRI.Marland Kitchen  Pt is already on ASA and plavix. Carotid US without significant stenosis.2-D echo without cardiac source of emboli. Home health  with supervision per PT. Lipid panel with elevated TAG. Continue statin. Appreciate Stroke team follow-up. Recommends 30 day outpatient Holter monitoring. (Cardiology office notified to arrange it)     Acute diastolic CHF (congestive heart failure) (HCC) Symptoms improved on low dose IV lasix bid. However patient now orthostatic and dehydrated. Disease discontinue blood pressure medication and gentle hydration.  2-D echo with normal EF and grade 1 diastolic dysfunction. Monitor strict I/O and daily weight. Continue aspirin and  statin.   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 Now having hypoglycemic episodes with poor by mouth intake. He has extremely labile blood glucose at home (reports CBG ranging from 25-200) Continue for now on Lantus 7 units at bedtime and 2 units aspart meal coverage. Patient needs to follow-up with an endocrinologist as outpatient.    Acute on CKD (chronic kidney disease) stage 3, GFR 30-59 ml/min Secondary to overdiuresis. Discontinued Lasix and placed on gentle hydration. He'll function better today.  Orthostatic hypotension Possibly due to dehydration with overdiuresis. Holding blood pressure medications including diuretic and gentle hydration. Orthostasis better today. Recheck in the morning.    Hypothyroidism Synthroid dose increased (recently checked TSH of 15 with low normal free T4)  BPPV Continue meclizine.   History of stroke Continue Plavix and statin.  Anxiety Continue home dose Xanax.  Headache/dizziness Secondary to orthostasis. When necessary tramadol.  Code Status : Full code  Family Communication  : None at bedside  Disposition Plan  : Initial plan for home with home health given persistent dizziness. The evaluated today and recommends SNF. Patient agrees. Social work consulted. Possible discharge on 8/2  If acute issues resolve.  Barriers For  Discharge : Orthostatic, UNCONTROLLED CBG.   Consults  :  None  Procedures  :  Head CT MRI brain  2-D echo  DVT Prophylaxis  :  Lovenox   Lab Results  Component Value Date   PLT 218 07/12/2016    Antibiotics  :    Anti-infectives    None        Objective:   Vitals:   07/15/16 2157 07/16/16 0149 07/16/16 0557 07/16/16 0917  BP:  (!) 157/63 (!) 153/59 138/60  Pulse:  77 80 77  Resp: 18 18 18 18   Temp: 98.7 F (37.1 C) 98.4 F (36.9 C) 98.9 F (37.2 C) 99.2 F (37.3 C)  TempSrc: Oral Oral Oral Oral  SpO2:  96% 97% 100%  Weight:      Height:        Wt Readings from Last 3 Encounters:  07/15/16 47.7 kg (105 lb 1.6 oz)  06/29/16 52.3 kg (115 lb 6.4 oz)  05/19/16 53.4 kg (117 lb 11.6 oz)     Intake/Output Summary (Last 24 hours) at 07/16/16 1407 Last data filed at 07/16/16 1300  Gross per 24 hour  Intake              480 ml  Output                2 ml  Net              478 ml     Physical Exam  Gen: Appears Her teeth HEENT: moist mucosa, supple neck Chest: Clear bilaterally CVS: N S1&S2, no murmurs, GI: soft, NT, ND, BS+ Musculoskeletal: warm, no edema  CNS: Alert and oriented    Data Review:    CBC  Recent Labs Lab 07/11/16 1600 07/11/16 1720 07/12/16 0650  WBC 10.7*  --  5.8  HGB 9.4* 9.9* 8.5*  HCT 27.7* 29.0* 24.6*  PLT 297  --  218  MCV 91.1  --  90.4  MCH 30.9  --  31.3  MCHC 33.9  --  34.6  RDW 13.6  --  13.9  LYMPHSABS 2.3  --   --   MONOABS 0.5  --   --   EOSABS 0.4  --   --  BASOSABS 0.1  --   --     Chemistries   Recent Labs Lab 07/11/16 1600 07/11/16 1720 07/12/16 0650 07/13/16 0226 07/14/16 0547 07/16/16 0558  NA 138 139 139 135 138 134*  K 4.5 4.6 3.6 4.4 4.5 4.8  CL 106 107 104 103 104 103  CO2 23  --  GLUCOSE 125* 121* 138* 273* 115* 400*  BUN 21* 24* 15 15 24* 25*  CREATININE 1.40* 1.30* 1.15* 1.25* 1.46* 1.25*  CALCIUM 9.3  --  8.5* 8.1* 8.8* 8.0*  AST 20  --   --   --   --   --     ALT 14  --   --   --   --   --   ALKPHOS 64  --   --   --   --   --   BILITOT 0.4  --   --   --   --   --    ------------------------------------------------------------------------------------------------------------------ No results for input(s): CHOL, HDL, LDLCALC, TRIG, CHOLHDL, LDLDIRECT in the last 72 hours.  Lab Results  Component Value Date   HGBA1C 7.2 (H) 07/13/2016   ------------------------------------------------------------------------------------------------------------------ No results for input(s): TSH, T4TOTAL, T3FREE, THYROIDAB in the last 72 hours.  Invalid input(s): FREET3 ------------------------------------------------------------------------------------------------------------------ No results for input(s): VITAMINB12, FOLATE, FERRITIN, TIBC, IRON, RETICCTPCT in the last 72 hours.  Coagulation profile  Recent Labs Lab 07/11/16 1600  INR 0.90    No results for input(s): DDIMER in the last 72 hours.  Cardiac Enzymes  Recent Labs Lab 07/12/16 0156 07/12/16 0650  TROPONINI <0.03 <0.03   ------------------------------------------------------------------------------------------------------------------    Component Value Date/Time   BNP 469.1 (H) 07/11/2016 1600    Inpatient Medications  Scheduled Meds: .  stroke: mapping our early stages of recovery book   Does not apply Once  . aspirin EC  81 mg Oral Daily  . atorvastatin  10 mg Oral QHS  . clopidogrel  75 mg Oral Daily  . cyanocobalamin  1,000 mcg Intramuscular Q30 days  . enoxaparin (LOVENOX) injection  30 mg Subcutaneous QHS  . escitalopram  10 mg Oral QHS  . feeding supplement (GLUCERNA SHAKE)  237 mL Oral TID BM  . folic acid  1 mg Oral Daily  . insulin aspart  0-15 Units Subcutaneous TID WC  . insulin aspart  0-5 Units Subcutaneous QHS  . insulin aspart  2 Units Subcutaneous TID WC  . insulin aspart  5 Units Subcutaneous Once  . insulin detemir  7 Units Subcutaneous QHS  . iron  polysaccharides  150 mg Oral BID  . levothyroxine  75 mcg Oral QAC breakfast  . loratadine  10 mg Oral Daily  . pantoprazole  40 mg Oral BID  . polyethylene glycol  17 g Oral QODAY  . saccharomyces boulardii  250 mg Oral BID  . sodium chloride  1 drop Both Eyes Daily  . sodium chloride flush  3 mL Intravenous Q12H  . sodium chloride flush  3 mL Intravenous Q12H  . thiamine  100 mg Oral Daily   Continuous Infusions:   PRN Meds:.sodium chloride, acetaminophen, alum & mag hydroxide-simeth, LORazepam, meclizine, ondansetron (ZOFRAN) IV, sodium chloride flush, traMADol  Micro Results No results found for this or any previous visit (from the past 240 hour(s)).  Radiology Reports Dg Chest 2 View  Result Date: 07/11/2016 CLINICAL DATA:  Bilateral lower extremity swelling for the past 2 days. EXAM: CHEST  2 VIEW COMPARISON:  06/22/2016;  05/10/2016; chest CT - 05/04/2016 FINDINGS: Grossly unchanged borderline enlarged cardiac silhouette. Normal mediastinal contours. Atherosclerotic plaque when the thoracic aorta. The lungs appear hyperexpanded with flattening of the bilateral hemidiaphragms mild diffuse slightly nodular thickening of the pulmonary interstitium. Interval development of a trace right-sided effusion with associated right basilar heterogeneous opacities. No definite evidence of edema. No acute osseus abnormalities. Post lower cervical ACDF, incompletely evaluated. IMPRESSION: 1. Interval development of a trace right-sided effusion with associated right basilar opacities, atelectasis versus infiltrate. A follow-up chest radiograph in 3 to 4 weeks after treatment is recommended to ensure resolution. 2. Cardiomegaly without evidence of edema. 3.  Emphysema. (ICD10-J43.9) 4.  Aortic Atherosclerosis (ICD10-170.0) Electronically Signed   By: Simonne Come M.D.   On: 07/11/2016 21:23  Ct Head Wo Contrast  Result Date: 07/11/2016 CLINICAL DATA:  Weakness with dizziness and blurred vision. Headache.  EXAM: CT HEAD WITHOUT CONTRAST TECHNIQUE: Contiguous axial images were obtained from the base of the skull through the vertex without intravenous contrast. COMPARISON:  06/22/2016. FINDINGS: There is no evidence for acute hemorrhage, hydrocephalus, mass lesion, or abnormal extra-axial fluid collection. No definite CT evidence for acute infarction. Diffuse loss of parenchymal volume is consistent with atrophy. Patchy low attenuation in the deep hemispheric and periventricular white matter is nonspecific, but likely reflects chronic microvascular ischemic demyelination. The visualized paranasal sinuses and mastoid air cells are clear. IMPRESSION: Stable.  No acute intracranial abnormality. Atrophy with chronic small vessel white matter ischemic disease. Electronically Signed   By: Kennith Center M.D.   On: 07/11/2016 18:00  Ct Head Wo Contrast  Result Date: 06/22/2016 CLINICAL DATA:  70 year old female with acute encephalopathy. EXAM: CT HEAD WITHOUT CONTRAST TECHNIQUE: Contiguous axial images were obtained from the base of the skull through the vertex without intravenous contrast. COMPARISON:  Head CT 05/06/2016. FINDINGS: Patchy and confluent areas of decreased attenuation are noted throughout the deep and periventricular white matter of the cerebral hemispheres bilaterally, compatible with chronic microvascular ischemic disease. No acute intracranial abnormalities. Specifically, no evidence of acute intracranial hemorrhage, no definite findings of acute/subacute cerebral ischemia, no mass, mass effect, hydrocephalus or abnormal intra or extra-axial fluid collections. Visualized paranasal sinuses and mastoids are well pneumatized. No acute displaced skull fractures are identified. IMPRESSION: 1. No acute intracranial abnormalities. 2. Chronic microvascular ischemic changes in cerebral white matter redemonstrated, as above. Electronically Signed   By: Trudie Reed M.D.   On: 06/22/2016 22:30   Mr Brain Wo  Contrast  Result Date: 07/12/2016 CLINICAL DATA:  Worsening dizziness. EXAM: MRI HEAD WITHOUT CONTRAST TECHNIQUE: Multiplanar, multiecho pulse sequences of the brain and surrounding structures were obtained without intravenous contrast. COMPARISON:  CT 07/11/2016.  MRI 05/16/2016. FINDINGS: Diffusion imaging shows a punctate acute infarction in the lateral thalamus on the left. No other acute infarction. Chronic small-vessel ischemic changes affect the pons. No focal cerebellar insult. Old small vessel infarctions affect the thalami and are present throughout the hemispheric deep white matter. Small old right occipital cortical infarction which was acute in June. No mass lesion, hemorrhage, hydrocephalus or extra-axial collection. No pituitary mass. No inflammatory sinus disease. No skull or skullbase lesion. IMPRESSION: New punctate acute infarction in the left lateral thalamus. No other acute infarction. Old ischemic changes as seen previously, including a right occipital cortical infarction that was acute in June of this year. Electronically Signed   By: Paulina Fusi M.D.   On: 07/12/2016 12:05  Dg Chest Portable 1 View  Result Date: 06/22/2016 CLINICAL DATA:  Cough.  History of pneumonia.  Smoker. EXAM: PORTABLE CHEST 1 VIEW COMPARISON:  05/10/2016 chest radiograph. FINDINGS: Surgical hardware from ACDF overlies the lower cervical spine. Stable cardiomediastinal silhouette with normal heart size. No pneumothorax. No pleural effusion. No pulmonary edema. No acute consolidative airspace disease. IMPRESSION: No active disease. Electronically Signed   By: Delbert Phenix M.D.   On: 06/22/2016 15:40    Time Spent in minutes  25   Eddie North M.D on 07/16/2016 at 2:07 PM  Between 7am to 7pm - Pager - [phone]  After 7pm go to [email or web address] - password [facility]  [facility] -  Office  [phone]

## 2016-07-17 DIAGNOSIS — F329 Major depressive disorder, single episode, unspecified: Secondary | ICD-10-CM

## 2016-07-17 LAB — GLUCOSE, CAPILLARY
GLUCOSE-CAPILLARY: 115 mg/dL — AB (ref 65–99)
GLUCOSE-CAPILLARY: 118 mg/dL — AB (ref 65–99)
GLUCOSE-CAPILLARY: 136 mg/dL — AB (ref 65–99)
GLUCOSE-CAPILLARY: 282 mg/dL — AB (ref 65–99)
Glucose-Capillary: 131 mg/dL — ABNORMAL HIGH (ref 65–99)
Glucose-Capillary: 206 mg/dL — ABNORMAL HIGH (ref 65–99)
Glucose-Capillary: 249 mg/dL — ABNORMAL HIGH (ref 65–99)
Glucose-Capillary: 330 mg/dL — ABNORMAL HIGH (ref 65–99)

## 2016-07-17 LAB — BASIC METABOLIC PANEL
ANION GAP: 4 — AB (ref 5–15)
BUN: 23 mg/dL — AB (ref 6–20)
CHLORIDE: 108 mmol/L (ref 101–111)
CO2: 28 mmol/L (ref 22–32)
Calcium: 8.3 mg/dL — ABNORMAL LOW (ref 8.9–10.3)
Creatinine, Ser: 1.25 mg/dL — ABNORMAL HIGH (ref 0.44–1.00)
GFR calc Af Amer: 50 mL/min — ABNORMAL LOW (ref >60)
GFR calc non Af Amer: 43 mL/min — ABNORMAL LOW (ref >60)
GLUCOSE: 207 mg/dL — AB (ref 65–99)
POTASSIUM: 4.9 mmol/L (ref 3.5–5.1)
SODIUM: 140 mmol/L (ref 135–145)

## 2016-07-17 MED ORDER — CARVEDILOL 3.125 MG PO TABS
3.1250 mg | ORAL_TABLET | Freq: Two times a day (BID) | ORAL | Status: DC
  Administered 2016-07-18: 3.125 mg via ORAL
  Filled 2016-07-17: qty 1

## 2016-07-17 NOTE — NC FL2 (Signed)
MEDICAID FL2 LEVEL OF CARE SCREENING TOOL     IDENTIFICATION  Patient Name: Cassandra [name] Birthdate: 1946-06-04 Sex: female Admission Date (Current Location): 07/11/2016  [facility] and [place] Number:  Producer, television/film/video and Address:  [facility], [address], [place], [place] [address]      Provider Number: [id number]  Attending Physician Name and Address:  Coralie Keens, *  Relative Name and Phone Number:       Current Level of Care: Hospital Recommended Level of Care: Skilled Nursing Facility Prior Approval Number:    Date Approved/Denied:   PASRR Number:    Discharge Plan: SNF    Current Diagnoses: Patient Active Problem List   Diagnosis Date Noted  . Dehydration   . Cerebral embolism with cerebral infarction 07/13/2016  . Acute diastolic CHF (congestive heart failure) (HCC) 07/11/2016  . Cerebrovascular accident (CVA) (HCC)   . Essential hypertension   . HLD (hyperlipidemia)   . Anemia of chronic disease   . Physical deconditioning   . Acute renal failure superimposed on stage 3 chronic kidney disease (HCC)   . Diabetic ketoacidosis with coma associated with type 1 diabetes mellitus (HCC)   . History of ETT   . Stroke (HCC) 05/16/2016  . Cerebral thrombosis with cerebral infarction 05/15/2016  . Acute on chronic renal failure (HCC)   . Generalized anxiety disorder 05/10/2016  . DKA (diabetic ketoacidoses) (HCC) 05/06/2016  . Acute respiratory failure (HCC) 05/06/2016  . Acute respiratory failure with hypoxia (HCC) 05/06/2016  . Acute encephalopathy 05/06/2016  . Septic shock (HCC)   . Acute respiratory failure with hypoxia and hypercapnia (HCC)   . 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 Depression   . Hypertension   . CKD (chronic kidney disease) stage 3, GFR 30-59 ml/min   . Hypothyroidism   . Vitamin B12 deficiency   . Benign paroxysmal positional vertigo   . Anxiety   .  Allergic rhinitis   . Glaucoma   . Benign hypertension with CKD (chronic kidney disease) stage III   . Tobacco use   . Cervicalgia   . Elevated liver enzymes   . History of alcohol use     Orientation RESPIRATION BLADDER Height & Weight     Self, Time, Situation, Place  Normal Continent Weight: 108 lb 8 oz (49.2 kg) Height:  5\' 2"  (157.5 cm)  BEHAVIORAL SYMPTOMS/MOOD NEUROLOGICAL BOWEL NUTRITION STATUS      Continent Diet (Heart Healthy/Carb Modified, Thin Liquids)  AMBULATORY STATUS COMMUNICATION OF NEEDS Skin   Limited Assist Verbally Normal                       Personal Care Assistance Level of Assistance  Bathing, Feeding, Dressing Bathing Assistance: Limited assistance Feeding assistance: Independent Dressing Assistance: Limited assistance     Functional Limitations Info  Sight, Hearing, Speech Sight Info: Adequate Hearing Info: Adequate Speech Info: Adequate    SPECIAL CARE FACTORS FREQUENCY  PT (By licensed PT)                    Contractures      Additional Factors Info  Code Status, Allergies, Psychotropic Code Status Info: Full Code Allergies Info: Allergies: Ciprofloxacin, Codeine, Doxycycline, Hydrocodone, Omnicef Cefdinir Psychotropic Info: Medications         Current Medications (07/17/2016):  This is the current hospital active medication list Current Facility-Administered Medications  Medication Dose  Route Frequency Provider Last Rate Last Dose  .  stroke: mapping our early stages of recovery book   Does not apply Once Nishant Dhungel, MD      . 0.9 %  sodium chloride infusion  250 mL Intravenous PRN Bobette Mo, MD      . acetaminophen (TYLENOL) tablet 650 mg  650 mg Oral Q6H PRN Eddie North, MD   650 mg at 07/13/16 2003  . alum & mag hydroxide-simeth (MAALOX/MYLANTA) 200-200-20 MG/5ML suspension 30 mL  30 mL Oral Q4H PRN Nishant Dhungel, MD      . aspirin EC tablet 81 mg  81 mg Oral Daily Bobette Mo, MD   81 mg at  07/17/16 0858  . atorvastatin (LIPITOR) tablet 10 mg  10 mg Oral QHS Bobette Mo, MD   10 mg at 07/16/16 2234  . clopidogrel (PLAVIX) tablet 75 mg  75 mg Oral Daily Bobette Mo, MD   75 mg at 07/17/16 0859  . cyanocobalamin ((VITAMIN B-12)) injection 1,000 mcg  1,000 mcg Intramuscular Q30 days Bobette Mo, MD   1,000 mcg at 07/12/16 1220  . enoxaparin (LOVENOX) injection 30 mg  30 mg Subcutaneous QHS Nishant Dhungel, MD   30 mg at 07/16/16 2236  . escitalopram (LEXAPRO) tablet 10 mg  10 mg Oral QHS Bobette Mo, MD   10 mg at 07/16/16 2234  . feeding supplement (GLUCERNA SHAKE) (GLUCERNA SHAKE) liquid 237 mL  237 mL Oral TID BM Bobette Mo, MD   237 mL at 07/16/16 2200  . folic acid (FOLVITE) tablet 1 mg  1 mg Oral Daily Bobette Mo, MD   1 mg at 07/17/16 0859  . insulin aspart (novoLOG) injection 0-15 Units  0-15 Units Subcutaneous TID WC Nishant Dhungel, MD   3 Units at 07/16/16 1615  . insulin aspart (novoLOG) injection 0-5 Units  0-5 Units Subcutaneous QHS Nishant Dhungel, MD      . insulin aspart (novoLOG) injection 2 Units  2 Units Subcutaneous TID WC Nishant Dhungel, MD   2 Units at 07/16/16 1614  . insulin aspart (novoLOG) injection 5 Units  5 Units Subcutaneous Once Leanne Chang, NP      . insulin detemir (LEVEMIR) injection 7 Units  7 Units Subcutaneous QHS Nishant Dhungel, MD   7 Units at 07/16/16 2235  . iron polysaccharides (NIFEREX) capsule 150 mg  150 mg Oral BID Bobette Mo, MD   150 mg at 07/17/16 0859  . levothyroxine (SYNTHROID, LEVOTHROID) tablet 75 mcg  75 mcg Oral QAC breakfast Bobette Mo, MD   75 mcg at 07/17/16 0724  . loratadine (CLARITIN) tablet 10 mg  10 mg Oral Daily Bobette Mo, MD   10 mg at 07/17/16 0858  . LORazepam (ATIVAN) tablet 0.5 mg  0.5 mg Oral Q8H PRN Bobette Mo, MD   0.5 mg at 07/17/16 1602  . meclizine (ANTIVERT) tablet 12.5 mg  12.5 mg Oral TID PRN Bobette Mo, MD   12.5 mg  at 07/17/16 1321  . ondansetron (ZOFRAN) injection 4 mg  4 mg Intravenous Q6H PRN Jinger Neighbors, NP   4 mg at 07/16/16 1627  . pantoprazole (PROTONIX) EC tablet 40 mg  40 mg Oral BID Bobette Mo, MD   40 mg at 07/17/16 0858  . polyethylene glycol (MIRALAX / GLYCOLAX) packet 17 g  17 g Oral QODAY Bobette Mo, MD   17 g at 07/16/16 0933  .  saccharomyces boulardii (FLORASTOR) capsule 250 mg  250 mg Oral BID Bobette Mo, MD   250 mg at 07/17/16 0859  . sodium chloride (MURO 128) 5 % ophthalmic solution 1 drop  1 drop Both Eyes Daily Bobette Mo, MD   1 drop at 07/17/16 0900  . sodium chloride flush (NS) 0.9 % injection 3 mL  3 mL Intravenous Q12H Bobette Mo, MD   3 mL at 07/17/16 0902  . sodium chloride flush (NS) 0.9 % injection 3 mL  3 mL Intravenous Q12H Bobette Mo, MD   3 mL at 07/17/16 0901  . sodium chloride flush (NS) 0.9 % injection 3 mL  3 mL Intravenous PRN Bobette Mo, MD      . thiamine (VITAMIN B-1) tablet 100 mg  100 mg Oral Daily Bobette Mo, MD   100 mg at 07/17/16 0859  . traMADol (ULTRAM) tablet 50 mg  50 mg Oral Q6H PRN Nishant Dhungel, MD   50 mg at 07/16/16 1128     Discharge Medications: Please see discharge summary for a list of discharge medications.  Relevant Imaging Results:  Relevant Lab Results:   Additional Information SSN:  [id number]  Dede Query, LCSW

## 2016-07-17 NOTE — Clinical Social Work Note (Signed)
Clinical Social Work Assessment  Patient Details  Name: Cassandra [name] MRN: [id number] Date of Birth: October 18, 1946  Date of referral:  07/17/16               Reason for consult:  Facility Placement                Permission sought to share information with:  Family Supports Permission granted to share information::  Yes, Verbal Permission Granted  Name::     Cassandra [name]  Relationship::  husband  Contact Information:  [phone]  Housing/Transportation Living arrangements for the past 2 months:  [place] of Information:  Patient Patient Interpreter Needed:  None Criminal Activity/Legal Involvement Pertinent to Current Situation/Hospitalization:  No - Comment as needed Significant Relationships:  Spouse Lives with:  Spouse Do you feel safe going back to the place where you live?  Yes Need for family participation in patient care:  No (Coment)  Care giving concerns:  No care giving concerns identified.   Social Worker assessment / plan:  CSW met with pt to address consult for SNF. CSW introduced herself and explained role of social work. CSW explained the process of discharging to SNF as PT recommended. PT stated that she is returning home with home health. Pt reported that she has home but couldn't remember which one and that her huband knew. CSW updated RNCM. CSW will initiate a SNF search for [facility] and follow up with bed offers, as a back up plan. CSW will continue to follow.   Employment status:  Retired Nurse, adult PT Recommendations:  [place] / Referral to community resources:  [place]  Patient/Family's Response to care:  Pt would like to return home.   Patient/Family's Understanding of and Emotional Response to Diagnosis, Current Treatment, and Prognosis:  Pt reported that she has home health and feels that she is able to manage at home.   Emotional  Assessment Appearance:  Appears stated age Attitude/Demeanor/Rapport:  Apprehensive Affect (typically observed):  Blunt Orientation:  Oriented to Self, Oriented to Place, Oriented to  Time, Oriented to Situation Alcohol / Substance use:  Never Used Psych involvement (Current and /or in the community):  No (Comment)  Discharge Needs  Concerns to be addressed:  Adjustment to Illness Readmission within the last 30 days:  No Current discharge risk:  Chronically ill Barriers to Discharge:  Continued Medical Work up   [employer], LCSW 07/17/2016, 4:32 PM

## 2016-07-17 NOTE — Progress Notes (Signed)
Physical Therapy Treatment Patient Details Name: Cassandra [name] MRN: [id number] DOB: 1946-08-07 Today's Date: 07/17/2016    History of Present Illness Patient is a 70 yo female admitted 07/11/16 with dizziness and SOB.  Patient with CHF, and dizziness continued.  Patient developed blurred vision.  MRI showed Lt thalamic infarct.   PMH:  HTN, DM, CKD, anxiety, depression, BPPV, bipolar disorder, mult CVA's    PT Comments    PTA performed tx and no nystagmus occurred during position changes or head turns.  Pt remains to present with weakness and [facility] to improve strength before returning home.  Pt remains dizzy.  Will inform supervising PT to f/u and assess patient further for BPPV signs.     Follow Up Recommendations  SNF;Supervision for mobility/OOB     Equipment Recommendations  Other (comment)    Recommendations for Other Services OT consult     Precautions / Restrictions Precautions Precautions: Fall Restrictions Weight Bearing Restrictions: No    Mobility  Bed Mobility Overal bed mobility: Modified Independent Bed Mobility: Supine to Sit;Sit to Supine     Supine to sit: Modified independent (Device/Increase time) Sit to supine: Modified independent (Device/Increase time)   General bed mobility comments: Good technique in and OOB no nystagmus noted during change in positions.  No nystagmus noted with head turns.   Transfers Overall transfer level: Needs assistance   Transfers: Sit to/from Stand Sit to Stand: Supervision         General transfer comment: Cues for safety and hand placement.    Ambulation/Gait Ambulation/Gait assistance: Min guard;Min assist Ambulation Distance (Feet): 120 Feet Assistive device: None Gait Pattern/deviations: Step-through pattern;Staggering right;Staggering left;Trunk flexed;Decreased stride length Gait velocity: decreased   General Gait Details: Pt presents with short strides and  decreased foot clearance during gait training.  Constant c/o dizziness and reports vision appears like a black cloud in front of her.  (Pt reports these symptoms are chronic)   Stairs            Wheelchair Mobility    Modified Rankin (Stroke Patients Only)       Balance Overall balance assessment: Needs assistance   Sitting balance-Leahy Scale: Good       Standing balance-Leahy Scale: Fair Standing balance comment: Righting reactions are good and intact.  Pt performed modified SLS to 6 inch step.  Performed B and able to right balance during stance.                      Cognition Arousal/Alertness: Awake/alert Behavior During Therapy: WFL for tasks assessed/performed Overall Cognitive Status: No family/caregiver present to determine baseline cognitive functioning                      Exercises General Exercises - Lower Extremity Hip ABduction/ADduction: AROM;Both;15 reps;Standing Hip Flexion/Marching: AROM;Both;15 reps;Standing Heel Raises: AROM;Both;15 reps;Standing Mini-Sqauts: AROM;Both;15 reps;Standing    General Comments        Pertinent Vitals/Pain Pain Assessment: No/denies pain    Home Living                      Prior Function            PT Goals (current goals can now be found in the care plan section) Acute Rehab PT Goals Patient Stated Goal: figure out why she is dizzy Potential to Achieve Goals: Good Progress towards PT goals: Progressing toward goals  Frequency  Min 4X/week    PT Plan Discharge plan needs to be updated    Co-evaluation             End of Session Equipment Utilized During Treatment: Gait belt Activity Tolerance: Other (comment) Patient left: with call bell/phone within reach;with chair alarm set;in bed     Time: 1500-1525 PT Time Calculation (min) (ACUTE ONLY): 25 min  Charges:  $Gait Training: 8-22 mins $Therapeutic Exercise: 8-22 mins                    G Codes:      Florestine Avers Jul 21, 2016, 3:45 PM  Joycelyn Rua, PTA pager [phone]

## 2016-07-17 NOTE — Progress Notes (Addendum)
PROGRESS NOTE    Cassandra [name]  UEK:[id number] DOB: 05/12/1946 DOA: 07/11/2016 PCP: Irving Copas, MD   Brief Narrative:  70 year old female with history of anxiety, bipolar disorder, depression, since hypertension, type 2 diabetes mellitus, (admitted recently with DKA) hypothyroidism, BPPV, GERD, history of CVA, history of alcohol abuse, tobacco use and B12 deficiency presented to the ED with dizziness and blurred vision since one day which is associated with some dyspnea and weakness and increased pitting edema of the legs. Patient first went to her PCP office and complained of feeling dizzy when trying to ambulate and also had some blurred vision. She describes her symptoms to be similar to her BPPV which usually gets better shortly after taking meclizine or sitting down. However the symptoms were prolonged and blurry vision was new. She felt like she was going to fall and had unsteady gait as well. Patient found to be in mild diastolic CHF and admitted to hospitalist service.  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 Acute ischemic cva. Will continue antiplatelet therapy with aspirin and plavix, physical therapy evaluation and neuro checks. Continue on atorvastatin. As needed meclizine. MRI with thalamic infarct.  2. Acute diastolic heart failure. Patient clinically euvolemic, will continue blood pressure control  with low dose coreg. EF 60%. EKG personally reviewed noted sinus rhythm. Blood pressure systolic 138 t0 170.  3. T1DM. Will continue glucose control and coverage with insulin sliding scale and levimir, serum  glucose 206-118-115.  4. AKI on CKD renal function with cr at 1.25 and K at 4,9, will follow on renal panel in am, avoid hypotension or nephrotoxic medications.   5. Orthostatic hypotension. Will start patient on low dose coreg and will check orthostatics. At home patient on amlodipine, coreg and furosemide.  6. Hypothyroidism. Continue levothyroxine, clinically euthyroid.  7. BPPV. Continue meclizine as needed.   8. Depression, continue escitalopram.   DVT prophylaxis: lovenox Code Status: full Family Communication: no family at bedside. Disposition Plan: snf   Consultants:   neurology  Procedures:   Antimicrobials:   Subjective: Patient with positive dizziness and dis-balance, no nausea or vomiting, tolerating po well, no chest pain.   Objective: Vitals:   07/17/16 0151 07/17/16 0500 07/17/16 0601 07/17/16 0936  BP: (!) 154/68  (!) 168/67 (!) 170/70  Pulse: 71  72 74  Resp: 18  18 18   Temp: 99.1 F (37.3 C)  99.4 F (37.4 C) 98.7 F (37.1 C)  TempSrc: Oral  Oral Oral  SpO2: 97%  95% 97%  Weight:  49.2 kg (108 lb 8 oz)    Height:        Intake/Output Summary (Last 24 hours) at 07/17/16 1255 Last data filed at 07/16/16 1800  Gross per 24 hour  Intake              480 ml  Output                0 ml  Net              480 ml   Filed Weights   07/15/16 0500 07/15/16 1505  07/17/16 0500  Weight: 48 kg (105 lb 12.8 oz) 47.7 kg (105 lb 1.6 oz) 49.2 kg (108 lb 8 oz)    Examination:  General exam: not in pain or dyspnea E ENT. No conjunctival pallor, oral mucosa moist. Respiratory system: Mild decreased breath sounds at bases due to poor inspiratory effort, no wheezing, rales or rhonchi. Respiratory effort normal.  Cardiovascular system: S1 & S2 heard, RRR. No JVD, murmurs, rubs, gallops or clicks. No pedal edema. Gastrointestinal system: Abdomen is nondistended, soft and nontender. No organomegaly or masses felt. Normal bowel sounds heard. Central nervous  system: Alert and oriented. No focal neurological deficits. Extremities: Symmetric 5 x 5 power. Skin: No rashes, lesions or ulcers.     Data Reviewed: I have personally reviewed following labs and imaging studies  CBC:  Recent Labs Lab 07/11/16 1600 07/11/16 1720 07/12/16 0650  WBC 10.7*  --  5.8  NEUTROABS 7.6  --   --   HGB 9.4* 9.9* 8.5*  HCT 27.7* 29.0* 24.6*  MCV 91.1  --  90.4  PLT 297  --  218   Basic Metabolic Panel:  Recent Labs Lab 07/12/16 0650 07/13/16 0226 07/14/16 0547 07/16/16 0558 07/17/16 0516  NA 139 135 138 134* 140  K 3.6 4.4 4.5 4.8 4.9  CL 104 103 104 103 108  CO2 25 27 29 24 28   GLUCOSE 138* 273* 115* 400* 207*  BUN 15 15 24* 25* 23*  CREATININE 1.15* 1.25* 1.46* 1.25* 1.25*  CALCIUM 8.5* 8.1* 8.8* 8.0* 8.3*   GFR: Estimated Creatinine Clearance: 33.5 mL/min (by C-G formula based on SCr of 1.25 mg/dL). Liver Function Tests:  Recent Labs Lab 07/11/16 1600  AST 20  ALT 14  ALKPHOS 64  BILITOT 0.4  PROT 6.2*  ALBUMIN 3.5   No results for input(s): LIPASE, AMYLASE in the last 168 hours. No results for input(s): AMMONIA in the last 168 hours. Coagulation Profile:  Recent Labs Lab 07/11/16 1600  INR 0.90   Cardiac Enzymes:  Recent Labs Lab 07/12/16 0156 07/12/16 0650  TROPONINI <0.03 <0.03   BNP (last 3 results) No results for input(s): PROBNP in the last 8760 hours. HbA1C: No results for input(s): HGBA1C in the last 72 hours. CBG:  Recent Labs Lab 07/16/16 2129 07/17/16 0054 07/17/16 0530 07/17/16 0651 07/17/16 1115  GLUCAP 125* 282* 206* 118* 115*   Lipid Profile: No results for input(s): CHOL, HDL, LDLCALC, TRIG, CHOLHDL, LDLDIRECT in the last 72 hours. Thyroid Function Tests: No results for input(s): TSH, T4TOTAL, FREET4, T3FREE, THYROIDAB in the last 72 hours. Anemia Panel: No results for input(s): VITAMINB12, FOLATE, FERRITIN, TIBC, IRON, RETICCTPCT in the last 72 hours. Sepsis Labs: No results for  input(s): PROCALCITON, LATICACIDVEN in the last 168 hours.  No results found for this or any previous visit (from the past 240 hour(s)).       Radiology Studies: No results found.      Scheduled Meds: .  stroke: mapping our early stages of recovery book   Does not apply Once  . aspirin EC  81 mg Oral Daily  . atorvastatin  10 mg Oral QHS  . clopidogrel  75 mg Oral Daily  . cyanocobalamin  1,000 mcg Intramuscular Q30 days  . enoxaparin (LOVENOX) injection  30 mg Subcutaneous QHS  . escitalopram  10 mg Oral QHS  . feeding supplement (GLUCERNA SHAKE)  237 mL Oral TID BM  . folic acid  1 mg Oral Daily  . insulin aspart  0-15 Units Subcutaneous TID WC  . insulin aspart  0-5 Units Subcutaneous QHS  . insulin aspart  2 Units Subcutaneous TID WC  . insulin aspart  5 Units Subcutaneous Once  . insulin detemir  7 Units Subcutaneous QHS  . iron polysaccharides  150 mg Oral BID  . levothyroxine  75 mcg Oral QAC breakfast  . loratadine  10 mg Oral Daily  . pantoprazole  40 mg Oral BID  . polyethylene glycol  17 g Oral QODAY  . saccharomyces boulardii  250 mg Oral BID  . sodium chloride  1 drop Both Eyes Daily  . sodium chloride flush  3 mL Intravenous Q12H  . sodium chloride flush  3 mL Intravenous Q12H  . thiamine  100 mg Oral Daily   Continuous Infusions:    LOS: 4 days        Mauricio Annett Gula, MD [facility] Pager [phone]  If 7PM-7AM, please contact night-coverage [email or web address] Password [facility] 07/17/2016, 12:55 PM

## 2016-07-18 LAB — VAS US CAROTID
LCCADSYS: 72 cm/s
LCCAPDIAS: 16 cm/s
LCCAPSYS: 105 cm/s
LEFT ECA DIAS: -6 cm/s
LEFT VERTEBRAL DIAS: 11 cm/s
LICADDIAS: -21 cm/s
LICAPDIAS: -9 cm/s
LICAPSYS: -52 cm/s
Left CCA dist dias: 13 cm/s
Left ICA dist sys: -75 cm/s
RCCAPSYS: 92 cm/s
RIGHT ECA DIAS: -9 cm/s
RIGHT VERTEBRAL DIAS: 12 cm/s
Right CCA prox dias: 11 cm/s
Right cca dist sys: -62 cm/s

## 2016-07-18 LAB — BASIC METABOLIC PANEL
ANION GAP: 7 (ref 5–15)
BUN: 19 mg/dL (ref 6–20)
CHLORIDE: 106 mmol/L (ref 101–111)
CO2: 28 mmol/L (ref 22–32)
Calcium: 8.9 mg/dL (ref 8.9–10.3)
Creatinine, Ser: 1.04 mg/dL — ABNORMAL HIGH (ref 0.44–1.00)
GFR calc Af Amer: >60 mL/min (ref >60)
GFR calc non Af Amer: 54 mL/min — ABNORMAL LOW (ref >60)
GLUCOSE: 37 mg/dL — AB (ref 65–99)
POTASSIUM: 3.9 mmol/L (ref 3.5–5.1)
Sodium: 141 mmol/L (ref 135–145)

## 2016-07-18 LAB — GLUCOSE, CAPILLARY
GLUCOSE-CAPILLARY: 93 mg/dL (ref 65–99)
GLUCOSE-CAPILLARY: 395 mg/dL — AB (ref 65–99)
Glucose-Capillary: 36 mg/dL — CL (ref 65–99)

## 2016-07-18 MED ORDER — INSULIN GLARGINE 100 UNIT/ML ~~LOC~~ SOLN: 5.0000 [IU] | Freq: Every day | SUBCUTANEOUS | 0 refills | Status: DC

## 2016-07-18 MED ORDER — INSULIN ASPART 100 UNIT/ML ~~LOC~~ SOLN: 0.0000 [IU] | Freq: Three times a day (TID) | SUBCUTANEOUS | 11 refills | Status: DC

## 2016-07-18 MED ORDER — CARVEDILOL 3.125 MG PO TABS: 3.1250 mg | ORAL_TABLET | Freq: Two times a day (BID) | ORAL | 0 refills | Status: DC

## 2016-07-18 NOTE — Care Management Note (Signed)
Case Management Note  Patient Details  Name: Cassandra [name] MRN: [id number] Date of Birth: 1946-03-31  Subjective/Objective:                    Action/Plan: Pt refusing to go to SNF and wanting to go home. Pt was active with [facility] prior to admission. Pt discharging home with resumption orders for [facility] services. CM met with the patient and she would like to continue with [facility] at discharge. [place] with [facility] informed of the discharge and the need for resumption of PT/RN. [place] accepted the resumption. Pt denies the need for any DME. Pts husband is going to transport her home. Bedside RN updated.   Expected Discharge Date:                  Expected Discharge Plan:  [place]  In-House Referral:     Discharge planning Services  CM Consult  Post Acute Care Choice:  Home Health Choice offered to:  Patient  DME Arranged:    DME Agency:     HH Arranged:  RN, PT [place] Agency:  [facility]  Status of Service:  Completed, signed off  If discussed at [place] of Stay Meetings, dates discussed:    Additional Comments:  Pollie Friar, RN 07/18/2016, 1:47 PM

## 2016-07-18 NOTE — Progress Notes (Signed)
Inpatient Diabetes Program Recommendations  AACE/ADA: New Consensus Statement on Inpatient Glycemic Control (2015)  Target Ranges:  Prepandial:   less than 140 mg/dL      Peak postprandial:   less than 180 mg/dL (1-2 hours)      Critically ill patients:  140 - 180 mg/dL    Inpatient Diabetes Program Recommendations:  Spoke with patient and noted being discharged today. Noted Lantus insulin to be decreased and patient is a type 1. Encouraged patient to keep log of CBGs and call PCP office if noticing CBGs increasing. Recommend primary care to endocrinologist to care for type 1.  Thank you, Cassandra [name]. Brylan Seubert, RN, MSN, CDE Inpatient Glycemic Control Team Team Pager [phone] (8am-5pm) 07/18/2016 1:38 PM

## 2016-07-18 NOTE — Clinical Social Work Note (Signed)
Pt is ready for discharge today and will return home with home health services. CSW asked MD for a home health social worker to be added to team in the even that pt choses to go to SNF. CSW also updated RNCM. CSW is signing off as no further needs identified.   Dede Query, MSW, LCSW  Clinical Social Worker  [phone]

## 2016-07-18 NOTE — Progress Notes (Signed)
Hypoglycemic Event  CBG: 36  Treatment: 15 GM carbohydrate snack  Symptoms: Shaky  Follow-up CBG: Time:0650 CBG Result:93  Possible Reasons for Event: Unknown  Comments/MD notified:MD on call notified.     Camelia Stelzner, [employer]

## 2016-07-18 NOTE — Discharge Summary (Addendum)
Physician Discharge Summary  Cassandra [name]:[id number] DOB: 1946-05-04 DOA: 07/11/2016  PCP: Irving Copas, MD  Admit date: 07/11/2016 Discharge date: 07/18/2016  Admitted From: home Disposition:  home  Recommendations for Outpatient Follow-up:  1. Follow up with PCP in 1-2 weeks  Home Health: yes Equipment/Devices: na  Discharge Condition: stable CODE STATUS: full Diet recommendation: Heart Healthy / Carb Modified   Brief/Interim Summary: This is a 70 year old female who presented to the hospital with the chief complaint of dizziness. Symptoms were associated with dyspnea and progressively worsening lotion edema. Apparently she was found hypoglycemic in the emergency department. The initial physical examination her blood pressure was 131/89, heart rate 70, respiratory rate 13, oxygen saturation 97%. Her mucous membranes were moist, her neck was supple, lungs had bibasilar rales, but no wheezing or rhonchi, heart S1-S2 present, no gallops, rubs or murmurs, 2+ lower extremity edema. Her abdomen was soft and nontender, extremities no edema, neurologically cranial nerves II through XII were grossly intact, strength 5 over 5 all 4 extremities. Her serum sodium was 138, potassium 4.5, chloride 106, creatinine 1.40, beyond 21, glucose 125. Her CT head was negative for acute infarcts. Her chest film was negative for acute infiltrates, her EKG was sinus rhythm with no signs of ischemia.  Patient was admitted to hospital with the working diagnosis of acute decompensation of diastolic heart failure complicated by hypoglycemia.   1. Acute diastolic heart failure decompensation. Patient was diuresed with IV furosemide with improvement of her symptoms. Initially her beta-blockade was held, but at time of discharge she has been placed on lower dose of carvedilol. Continue taking furosemide daily. Echocardiogram showed ejection fraction 60-65% with mild concentric hypertrophy and abnormal left  ventricle relaxation (grade 1 diastolic dysfunction). Holding ACE inhibitor for now due to risk of hypotension.  2. Left lateral thalamus acute infarction. Patient complain of significant vertigo what prompted further evaluation with an MRI which resulted positive for new punctuate acute infarction in left lateral thalamus, noted old ischemic changes at the right occipital cortical region. Neurology was consulted, recommendations to continue dual antiplatelet therapy, carotid ultrasound was negative for significant stenosis. Physical therapy evaluation recommended skilled nursing facility for further rehabilitation, she refuses to be discharged to nursing facility and she elected to go home with home health services. Continue statin therapy, with atorvastatin.  3. Type 1 diabetes mellitus. Patient was placed on insulin sliding scale for glucose coverage, she was continued on her long-acting insulin which has been modified to prevent hypoglycemia, her serum glucose has been ranging between 300 and 93, she had an episode of hypoglycemia this morning down to 36, through the course of the day her serum glucose has improved, but still with significant fluctuation, will decrease long-acting insulin to 5 units daily, and continue pre-meal insulin, and insulin sliding scale. Will need a very close follow-up on serum glucose, patient has refused to go to skilled nursing facility.  4. Hypothyroidism. Patient was continued on levothyroxine.  5. Acute kidney injury chronic kidney disease. Kidney function has improved with discharge creatinine down to 1.04, potassium is 3.9 with sodium 141. We did close follow-up on kidney function and electrolytes. Will continue furosemide for heart failure. For now hold on potassium supplements, her potassium has been as high as 4.9 during this hospitalization.  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 Darrol Angel NP at [facility] in about 2 months. Thanks.   Diet - low sodium heart healthy    Complete by:  As directed   Discharge instructions    Complete by:  As directed   Please follow with primary care in 7 days. Please check blood glucose before meals and at night, keep a log.   Face-to-face encounter (required for Medicare/Medicaid patients)    Complete by:  As directed   I [name] certify that this patient is under my care and that I, or a nurse practitioner or physician's assistant working with me, had a face-to-face encounter that meets the physician face-to-face encounter requirements with this patient on 07/18/2016. The encounter with the patient was in whole, or in part for the following medical condition(s) which is the primary reason for home health care (List medical condition): CVA   The encounter with the patient was in whole, or in part, for the following medical condition, which is the primary reason for home health care:  thalamic cva   I certify that, based on my findings, the following services are medically necessary home health services:   Physical therapy Nursing     Reason for Medically Necessary Home Health Services:  Skilled Nursing- Teaching of Disease Process/Symptom Management   My clinical findings support the need for the above services:  Unable to leave home safely without assistance and/or assistive device   Further, I certify that my clinical findings support that this patient is homebound due to:  Unable to leave home safely  without assistance   Home Health    Complete by:  As directed   To provide the following care/treatments:   PT RN     Increase activity slowly    Complete by:  As directed       Medication List    STOP taking these medications   amLODipine 5 MG tablet Commonly known as:  NORVASC     TAKE these medications   aspirin 81 MG EC tablet Take 1 tablet (81 mg total) by mouth daily.   atorvastatin 10 MG tablet Commonly known as:  LIPITOR Take 1 tablet (10 mg total) by mouth daily at 6 PM. What changed:  when to take this   carvedilol 3.125 MG tablet Commonly known as:  COREG Take 1 tablet (3.125 mg total) by mouth 2 (two) times daily with a meal. What changed:  medication strength  how much to take   cetirizine 10 MG tablet Commonly known as:  ZYRTEC Take 10 mg by mouth daily as needed for allergies.   clopidogrel 75 MG tablet Commonly known as:  PLAVIX Take 1 tablet (75 mg total) by mouth daily.   cyanocobalamin 1000 MCG/ML injection Commonly known as:  (VITAMIN B-12) Inject 1,000 mcg into the muscle every 30 (thirty) days. Last injection approx 3rd week of June, 2017   escitalopram 10 MG tablet Commonly known as:  LEXAPRO Take 1 tablet (10 mg total) by mouth at bedtime.   feeding supplement (GLUCERNA SHAKE) Liqd Take 237 mLs by mouth 3 (three) times daily between meals.  folic acid 1 MG tablet Commonly known as:  FOLVITE Take 1 tablet (1 mg total) by mouth daily.   furosemide 20 MG tablet Commonly known as:  LASIX Take 1 tablet (20 mg total) by mouth daily.   hydrocortisone 2.5 % rectal cream Commonly known as:  ANUSOL-HC Place rectally 4 (four) times daily.   insulin glargine 100 UNIT/ML injection Commonly known as:  LANTUS Inject 0.05 mLs (5 Units total) into the skin daily. What changed:  how much to take   insulin lispro 100 UNIT/ML injection Commonly known as:  HUMALOG Inject 0.04 mLs (4 Units total) into the skin 3 (three) times daily with  meals. Reported on 06/22/2016 What changed:  how much to take  when to take this  reasons to take this  additional instructions   iron polysaccharides 150 MG capsule Commonly known as:  NIFEREX Take 1 capsule (150 mg total) by mouth 2 (two) times daily. What changed:  when to take this   levothyroxine 75 MCG tablet Commonly known as:  SYNTHROID, LEVOTHROID Take 75 mcg by mouth daily.   LORazepam 0.5 MG tablet Commonly known as:  ATIVAN Take 1 tablet (0.5 mg total) by mouth every 8 (eight) hours as needed for anxiety.   meclizine 25 MG tablet Commonly known as:  ANTIVERT Take 1 tablet (25 mg total) by mouth 3 (three) times daily as needed for dizziness. What changed:  when to take this   pantoprazole 40 MG tablet Commonly known as:  PROTONIX Take 1 tablet (40 mg total) by mouth 2 (two) times daily. What changed:  when to take this  reasons to take this   polyethylene glycol packet Commonly known as:  MIRALAX / GLYCOLAX Take 17 g by mouth every other day.   saccharomyces boulardii 250 MG capsule Commonly known as:  FLORASTOR Take 1 capsule (250 mg total) by mouth 2 (two) times daily.   sodium chloride 5 % ophthalmic solution Commonly known as:  MURO 128 Place 1 drop into both eyes daily.   thiamine 100 MG tablet Take 1 tablet (100 mg total) by mouth daily.   Insulin sliding scale. See details.     Follow-up Information    MARTIN,NANCY CAROLYN, NP Follow up in 2 month(s).   Specialty:  Family Medicine Contact information: [address] [place] [address] [phone]          Allergies  Allergen Reactions  . Ciprofloxacin Other (See Comments)    Pt does not recall specific reaction  . Codeine Diarrhea and Nausea And Vomiting  . Doxycycline Diarrhea and Nausea And Vomiting  . Hydrocodone Other (See Comments)    Pt does not recall specific reaction  . Omnicef [Cefdinir] Nausea Only and Other (See Comments)    Constipation,  tolerated Zosyn    Consultations:  Neurology   Procedures/Studies: Dg Chest 2 View  Result Date: 07/11/2016 CLINICAL DATA:  Bilateral lower extremity swelling for the past 2 days. EXAM: CHEST  2 VIEW COMPARISON:  06/22/2016; 05/10/2016; chest CT - 05/04/2016 FINDINGS: Grossly unchanged borderline enlarged cardiac silhouette. Normal mediastinal contours. Atherosclerotic plaque when the thoracic aorta. The lungs appear hyperexpanded with flattening of the bilateral hemidiaphragms mild diffuse slightly nodular thickening of the pulmonary interstitium. Interval development of a trace right-sided effusion with associated right basilar heterogeneous opacities. No definite evidence of edema. No acute osseus abnormalities. Post lower cervical ACDF, incompletely evaluated. IMPRESSION: 1. Interval development of a trace right-sided effusion with associated right basilar opacities, atelectasis versus  infiltrate. A follow-up chest radiograph in 3 to 4 weeks after treatment is recommended to ensure resolution. 2. Cardiomegaly without evidence of edema. 3.  Emphysema. (ICD10-J43.9) 4.  Aortic Atherosclerosis (ICD10-170.0) Electronically Signed   By: Simonne Come M.D.   On: 07/11/2016 21:23  Ct Head Wo Contrast  Result Date: 07/11/2016 CLINICAL DATA:  Weakness with dizziness and blurred vision. Headache. EXAM: CT HEAD WITHOUT CONTRAST TECHNIQUE: Contiguous axial images were obtained from the base of the skull through the vertex without intravenous contrast. COMPARISON:  06/22/2016. FINDINGS: There is no evidence for acute hemorrhage, hydrocephalus, mass lesion, or abnormal extra-axial fluid collection. No definite CT evidence for acute infarction. Diffuse loss of parenchymal volume is consistent with atrophy. Patchy low attenuation in the deep hemispheric and periventricular white matter is nonspecific, but likely reflects chronic microvascular ischemic demyelination. The visualized paranasal sinuses and mastoid air  cells are clear. IMPRESSION: Stable.  No acute intracranial abnormality. Atrophy with chronic small vessel white matter ischemic disease. Electronically Signed   By: Kennith Center M.D.   On: 07/11/2016 18:00  Ct Head Wo Contrast  Result Date: 06/22/2016 CLINICAL DATA:  70 year old female with acute encephalopathy. EXAM: CT HEAD WITHOUT CONTRAST TECHNIQUE: Contiguous axial images were obtained from the base of the skull through the vertex without intravenous contrast. COMPARISON:  Head CT 05/06/2016. FINDINGS: Patchy and confluent areas of decreased attenuation are noted throughout the deep and periventricular white matter of the cerebral hemispheres bilaterally, compatible with chronic microvascular ischemic disease. No acute intracranial abnormalities. Specifically, no evidence of acute intracranial hemorrhage, no definite findings of acute/subacute cerebral ischemia, no mass, mass effect, hydrocephalus or abnormal intra or extra-axial fluid collections. Visualized paranasal sinuses and mastoids are well pneumatized. No acute displaced skull fractures are identified. IMPRESSION: 1. No acute intracranial abnormalities. 2. Chronic microvascular ischemic changes in cerebral white matter redemonstrated, as above. Electronically Signed   By: Trudie Reed M.D.   On: 06/22/2016 22:30   Mr Brain Wo Contrast  Result Date: 07/12/2016 CLINICAL DATA:  Worsening dizziness. EXAM: MRI HEAD WITHOUT CONTRAST TECHNIQUE: Multiplanar, multiecho pulse sequences of the brain and surrounding structures were obtained without intravenous contrast. COMPARISON:  CT 07/11/2016.  MRI 05/16/2016. FINDINGS: Diffusion imaging shows a punctate acute infarction in the lateral thalamus on the left. No other acute infarction. Chronic small-vessel ischemic changes affect the pons. No focal cerebellar insult. Old small vessel infarctions affect the thalami and are present throughout the hemispheric deep white matter. Small old right  occipital cortical infarction which was acute in June. No mass lesion, hemorrhage, hydrocephalus or extra-axial collection. No pituitary mass. No inflammatory sinus disease. No skull or skullbase lesion. IMPRESSION: New punctate acute infarction in the left lateral thalamus. No other acute infarction. Old ischemic changes as seen previously, including a right occipital cortical infarction that was acute in June of this year. Electronically Signed   By: Paulina Fusi M.D.   On: 07/12/2016 12:05  Dg Chest Portable 1 View  Result Date: 06/22/2016 CLINICAL DATA:  Cough.  History of pneumonia.  Smoker. EXAM: PORTABLE CHEST 1 VIEW COMPARISON:  05/10/2016 chest radiograph. FINDINGS: Surgical hardware from ACDF overlies the lower cervical spine. Stable cardiomediastinal silhouette with normal heart size. No pneumothorax. No pleural effusion. No pulmonary edema. No acute consolidative airspace disease. IMPRESSION: No active disease. Electronically Signed   By: Delbert Phenix M.D.   On: 06/22/2016 15:40       Subjective: Patient is feeling better, denies any shortness of breath, cough or wheezing.  Denies any nausea or vomiting. Generalized weakness.  Discharge Exam: Vitals:   07/18/16 0554 07/18/16 0935  BP: (!) 157/75 (!) 166/69  Pulse: 79 77  Resp: 20 20  Temp: 98.6 F (37 C) 98.1 F (36.7 C)   Vitals:   07/17/16 2120 07/18/16 0153 07/18/16 0554 07/18/16 0935  BP: (!) 145/62 (!) 154/63 (!) 157/75 (!) 166/69  Pulse: 70 71 79 77  Resp: 18 18 20 20   Temp: 98.6 F (37 C) 98.2 F (36.8 C) 98.6 F (37 C) 98.1 F (36.7 C)  TempSrc: Oral Oral Oral Oral  SpO2: 96% 99% 96% 96%  Weight:   48.6 kg (107 lb 3.2 oz)   Height:        General: Pt is alert, awake, deconditioning Cardiovascular: RRR, S1/S2 +, no rubs, no gallops Respiratory: CTA bilaterally, no wheezing, no rhonchi Abdominal: Soft, NT, ND, bowel sounds + Extremities: no edema, no cyanosis    The results of significant diagnostics  from this hospitalization (including imaging, microbiology, ancillary and laboratory) are listed below for reference.     Microbiology: No results found for this or any previous visit (from the past 240 hour(s)).   Labs: BNP (last 3 results)  Recent Labs  07/11/16 1600  BNP 469.1*   Basic Metabolic Panel:  Recent Labs Lab 07/13/16 0226 07/14/16 0547 07/16/16 0558 07/17/16 0516 07/18/16 0510  NA 135 138 134* 140 141  K 4.4 4.5 4.8 4.9 3.9  CL 103 104 103 108 106  CO2 27 29 24 28 28   GLUCOSE 273* 115* 400* 207* 37*  BUN 15 24* 25* 23* 19  CREATININE 1.25* 1.46* 1.25* 1.25* 1.04*  CALCIUM 8.1* 8.8* 8.0* 8.3* 8.9   Liver Function Tests:  Recent Labs Lab 07/11/16 1600  AST 20  ALT 14  ALKPHOS 64  BILITOT 0.4  PROT 6.2*  ALBUMIN 3.5   No results for input(s): LIPASE, AMYLASE in the last 168 hours. No results for input(s): AMMONIA in the last 168 hours. CBC:  Recent Labs Lab 07/11/16 1600 07/11/16 1720 07/12/16 0650  WBC 10.7*  --  5.8  NEUTROABS 7.6  --   --   HGB 9.4* 9.9* 8.5*  HCT 27.7* 29.0* 24.6*  MCV 91.1  --  90.4  PLT 297  --  218   Cardiac Enzymes:  Recent Labs Lab 07/12/16 0156 07/12/16 0650  TROPONINI <0.03 <0.03   BNP: Invalid input(s): POCBNP CBG:  Recent Labs Lab 07/17/16 2144 07/17/16 2356 07/18/16 0614 07/18/16 0652 07/18/16 1111  GLUCAP 136* 131* 36* 93 395*   D-Dimer No results for input(s): DDIMER in the last 72 hours. Hgb A1c No results for input(s): HGBA1C in the last 72 hours. Lipid Profile No results for input(s): CHOL, HDL, LDLCALC, TRIG, CHOLHDL, LDLDIRECT in the last 72 hours. Thyroid function studies No results for input(s): TSH, T4TOTAL, T3FREE, THYROIDAB in the last 72 hours.  Invalid input(s): FREET3 Anemia work up No results for input(s): VITAMINB12, FOLATE, FERRITIN, TIBC, IRON, RETICCTPCT in the last 72 hours. Urinalysis    Component Value Date/Time   COLORURINE YELLOW 07/14/2016 1927    APPEARANCEUR CLEAR 07/14/2016 1927   APPEARANCEUR Clear 06/15/2014 1508   LABSPEC 1.017 07/14/2016 1927   LABSPEC 1.010 06/15/2014 1508   PHURINE 6.0 07/14/2016 1927   GLUCOSEU 250 (A) 07/14/2016 1927   GLUCOSEU Negative 06/15/2014 1508   HGBUR NEGATIVE 07/14/2016 1927   BILIRUBINUR NEGATIVE 07/14/2016 1927   BILIRUBINUR Negative 06/15/2014 1508   KETONESUR NEGATIVE 07/14/2016 1927  PROTEINUR >300 (A) 07/14/2016 1927   UROBILINOGEN 0.2 09/14/2015 1617   NITRITE NEGATIVE 07/14/2016 1927   LEUKOCYTESUR TRACE (A) 07/14/2016 1927   LEUKOCYTESUR Negative 06/15/2014 1508   Sepsis Labs Invalid input(s): PROCALCITONIN,  WBC,  LACTICIDVEN Microbiology No results found for this or any previous visit (from the past 240 hour(s)).   Time coordinating discharge: 45 minutes  SIGNED:   Coralie Keens, MD  [facility] 07/18/2016, 11:56 AM Pager   If 7PM-7AM, please contact night-coverage [email or web address] Password [email or web address]

## 2016-07-18 NOTE — Progress Notes (Signed)
Patient is being discharged home with home health PT and RN. Discharge instructions were given to patient and family. Information related to stroke, diabetes, smoking and heart failure were covered.

## 2016-07-18 NOTE — Care Management Important Message (Signed)
Important Message  Patient Details  Name: LURETTA GAMMEL MRN: [id number] Date of Birth: [birth year]   Medicare Important Message Given:  Yes    Bernadette Hoit 07/18/2016, 10:54 AM

## 2016-07-19 ENCOUNTER — Encounter (HOSPITAL_COMMUNITY): Payer: Self-pay | Admitting: Emergency Medicine

## 2016-07-19 ENCOUNTER — Observation Stay (HOSPITAL_COMMUNITY)
Admission: EM | Admit: 2016-07-19 | Discharge: 2016-07-21 | Disposition: A | Discharge status: Home / Self Care | Payer: Medicare Other | Attending: Internal Medicine | Admitting: Internal Medicine

## 2016-07-19 DIAGNOSIS — B961 Klebsiella pneumoniae [K. pneumoniae] as the cause of diseases classified elsewhere: Secondary | ICD-10-CM | POA: Diagnosis not present

## 2016-07-19 DIAGNOSIS — R350 Frequency of micturition: Secondary | ICD-10-CM | POA: Insufficient documentation

## 2016-07-19 DIAGNOSIS — Z794 Long term (current) use of insulin: Secondary | ICD-10-CM | POA: Insufficient documentation

## 2016-07-19 DIAGNOSIS — N183 Chronic kidney disease, stage 3 unspecified: Secondary | ICD-10-CM | POA: Diagnosis present

## 2016-07-19 DIAGNOSIS — K644 Residual hemorrhoidal skin tags: Secondary | ICD-10-CM | POA: Insufficient documentation

## 2016-07-19 DIAGNOSIS — N179 Acute kidney failure, unspecified: Principal | ICD-10-CM

## 2016-07-19 DIAGNOSIS — Z8673 Personal history of transient ischemic attack (TIA), and cerebral infarction without residual deficits: Secondary | ICD-10-CM | POA: Insufficient documentation

## 2016-07-19 DIAGNOSIS — F319 Bipolar disorder, unspecified: Secondary | ICD-10-CM | POA: Diagnosis not present

## 2016-07-19 DIAGNOSIS — Z79899 Other long term (current) drug therapy: Secondary | ICD-10-CM | POA: Diagnosis not present

## 2016-07-19 DIAGNOSIS — H8110 Benign paroxysmal vertigo, unspecified ear: Secondary | ICD-10-CM | POA: Diagnosis not present

## 2016-07-19 DIAGNOSIS — N390 Urinary tract infection, site not specified: Secondary | ICD-10-CM | POA: Diagnosis not present

## 2016-07-19 DIAGNOSIS — I5032 Chronic diastolic (congestive) heart failure: Secondary | ICD-10-CM | POA: Diagnosis not present

## 2016-07-19 DIAGNOSIS — Z7902 Long term (current) use of antithrombotics/antiplatelets: Secondary | ICD-10-CM | POA: Diagnosis not present

## 2016-07-19 DIAGNOSIS — R109 Unspecified abdominal pain: Secondary | ICD-10-CM

## 2016-07-19 DIAGNOSIS — IMO0002 Reserved for concepts with insufficient information to code with codable children: Secondary | ICD-10-CM | POA: Diagnosis present

## 2016-07-19 DIAGNOSIS — Z7982 Long term (current) use of aspirin: Secondary | ICD-10-CM | POA: Diagnosis not present

## 2016-07-19 DIAGNOSIS — I6330 Cerebral infarction due to thrombosis of unspecified cerebral artery: Secondary | ICD-10-CM | POA: Diagnosis present

## 2016-07-19 DIAGNOSIS — E039 Hypothyroidism, unspecified: Secondary | ICD-10-CM | POA: Diagnosis not present

## 2016-07-19 DIAGNOSIS — K219 Gastro-esophageal reflux disease without esophagitis: Secondary | ICD-10-CM | POA: Insufficient documentation

## 2016-07-19 DIAGNOSIS — I10 Essential (primary) hypertension: Secondary | ICD-10-CM | POA: Diagnosis not present

## 2016-07-19 DIAGNOSIS — D638 Anemia in other chronic diseases classified elsewhere: Secondary | ICD-10-CM | POA: Insufficient documentation

## 2016-07-19 DIAGNOSIS — I5033 Acute on chronic diastolic (congestive) heart failure: Secondary | ICD-10-CM

## 2016-07-19 DIAGNOSIS — I130 Hypertensive heart and chronic kidney disease with heart failure and stage 1 through stage 4 chronic kidney disease, or unspecified chronic kidney disease: Secondary | ICD-10-CM | POA: Insufficient documentation

## 2016-07-19 DIAGNOSIS — F17210 Nicotine dependence, cigarettes, uncomplicated: Secondary | ICD-10-CM | POA: Insufficient documentation

## 2016-07-19 DIAGNOSIS — E162 Hypoglycemia, unspecified: Secondary | ICD-10-CM

## 2016-07-19 DIAGNOSIS — R531 Weakness: Secondary | ICD-10-CM | POA: Diagnosis present

## 2016-07-19 DIAGNOSIS — E1022 Type 1 diabetes mellitus with diabetic chronic kidney disease: Secondary | ICD-10-CM | POA: Insufficient documentation

## 2016-07-19 DIAGNOSIS — E538 Deficiency of other specified B group vitamins: Secondary | ICD-10-CM | POA: Insufficient documentation

## 2016-07-19 DIAGNOSIS — J309 Allergic rhinitis, unspecified: Secondary | ICD-10-CM | POA: Insufficient documentation

## 2016-07-19 DIAGNOSIS — E1165 Type 2 diabetes mellitus with hyperglycemia: Secondary | ICD-10-CM | POA: Diagnosis present

## 2016-07-19 DIAGNOSIS — Z720 Tobacco use: Secondary | ICD-10-CM | POA: Diagnosis present

## 2016-07-19 LAB — COMPREHENSIVE METABOLIC PANEL
ALBUMIN: 3.5 g/dL (ref 3.5–5.0)
ALK PHOS: 63 U/L (ref 38–126)
ALT: 15 U/L (ref 14–54)
AST: 28 U/L (ref 15–41)
Anion gap: 10 (ref 5–15)
BILIRUBIN TOTAL: 0.5 mg/dL (ref 0.3–1.2)
BUN: 31 mg/dL — AB (ref 6–20)
CO2: 28 mmol/L (ref 22–32)
Calcium: 9.7 mg/dL (ref 8.9–10.3)
Chloride: 99 mmol/L — ABNORMAL LOW (ref 101–111)
Creatinine, Ser: 1.58 mg/dL — ABNORMAL HIGH (ref 0.44–1.00)
GFR calc Af Amer: 38 mL/min — ABNORMAL LOW (ref >60)
GFR calc non Af Amer: 33 mL/min — ABNORMAL LOW (ref >60)
GLUCOSE: 79 mg/dL (ref 65–99)
POTASSIUM: 4.6 mmol/L (ref 3.5–5.1)
Sodium: 137 mmol/L (ref 135–145)
TOTAL PROTEIN: 6.7 g/dL (ref 6.5–8.1)

## 2016-07-19 LAB — I-STAT VENOUS BLOOD GAS, ED
ACID-BASE EXCESS: 4 mmol/L — AB (ref 0.0–2.0)
BICARBONATE: 27.5 meq/L — AB (ref 20.0–24.0)
O2 SAT: 76 %
TCO2: 29 mmol/L (ref 0–100)
pCO2, Ven: 35 mmHg — ABNORMAL LOW (ref 45.0–50.0)
pH, Ven: 7.503 — ABNORMAL HIGH (ref 7.250–7.300)
pO2, Ven: 37 mmHg (ref 31.0–45.0)

## 2016-07-19 LAB — CBC WITH DIFFERENTIAL/PLATELET
BASOS ABS: 0.1 10*3/uL (ref 0.0–0.1)
BASOS PCT: 1 %
Eosinophils Absolute: 0.3 10*3/uL (ref 0.0–0.7)
Eosinophils Relative: 3 %
HEMATOCRIT: 28.8 % — AB (ref 36.0–46.0)
HEMOGLOBIN: 9.6 g/dL — AB (ref 12.0–15.0)
Lymphocytes Relative: 26 %
Lymphs Abs: 2.4 10*3/uL (ref 0.7–4.0)
MCH: 30.7 pg (ref 26.0–34.0)
MCHC: 33.3 g/dL (ref 30.0–36.0)
MCV: 92 fL (ref 78.0–100.0)
Monocytes Absolute: 0.5 10*3/uL (ref 0.1–1.0)
Monocytes Relative: 5 %
NEUTROS ABS: 5.8 10*3/uL (ref 1.7–7.7)
NEUTROS PCT: 65 %
Platelets: 277 10*3/uL (ref 150–400)
RBC: 3.13 MIL/uL — ABNORMAL LOW (ref 3.87–5.11)
RDW: 13.2 % (ref 11.5–15.5)
WBC: 9 10*3/uL (ref 4.0–10.5)

## 2016-07-19 LAB — URINALYSIS, ROUTINE W REFLEX MICROSCOPIC
Bilirubin Urine: NEGATIVE
GLUCOSE, UA: 100 mg/dL — AB
KETONES UR: 15 mg/dL — AB
Nitrite: NEGATIVE
Specific Gravity, Urine: 1.016 (ref 1.005–1.030)
pH: 7.5 (ref 5.0–8.0)

## 2016-07-19 LAB — CBG MONITORING, ED
GLUCOSE-CAPILLARY: 37 mg/dL — AB (ref 65–99)
Glucose-Capillary: 98 mg/dL (ref 65–99)

## 2016-07-19 LAB — URINE MICROSCOPIC-ADD ON

## 2016-07-19 MED ORDER — SODIUM CHLORIDE 0.9 % IV SOLN
INTRAVENOUS | Status: AC
  Administered 2016-07-19: 1 mL via INTRAVENOUS

## 2016-07-19 MED ORDER — WITCH HAZEL-GLYCERIN EX PADS
MEDICATED_PAD | CUTANEOUS | Status: DC | PRN
  Administered 2016-07-20: 1 via TOPICAL
  Filled 2016-07-19: qty 100

## 2016-07-19 MED ORDER — SODIUM CHLORIDE 0.9 % IV BOLUS (SEPSIS)
1000.0000 mL | Freq: Once | INTRAVENOUS | Status: AC
  Administered 2016-07-19: 1000 mL via INTRAVENOUS

## 2016-07-19 MED ORDER — ASPIRIN EC 81 MG PO TBEC
81.0000 mg | DELAYED_RELEASE_TABLET | Freq: Every day | ORAL | Status: DC
  Administered 2016-07-20 – 2016-07-21 (×2): 81 mg via ORAL
  Filled 2016-07-19 (×2): qty 1

## 2016-07-19 MED ORDER — INSULIN GLARGINE 100 UNIT/ML ~~LOC~~ SOLN
5.0000 [IU] | Freq: Every day | SUBCUTANEOUS | Status: DC
  Administered 2016-07-20 – 2016-07-21 (×2): 5 [IU] via SUBCUTANEOUS
  Filled 2016-07-19 (×2): qty 0.05

## 2016-07-19 MED ORDER — INSULIN ASPART 100 UNIT/ML ~~LOC~~ SOLN
0.0000 [IU] | Freq: Three times a day (TID) | SUBCUTANEOUS | Status: DC
  Administered 2016-07-20: 1 [IU] via SUBCUTANEOUS
  Administered 2016-07-20: 3 [IU] via SUBCUTANEOUS
  Administered 2016-07-21: 5 [IU] via SUBCUTANEOUS

## 2016-07-19 MED ORDER — ACETAMINOPHEN 325 MG PO TABS
650.0000 mg | ORAL_TABLET | Freq: Four times a day (QID) | ORAL | Status: DC | PRN
  Administered 2016-07-20 – 2016-07-21 (×4): 650 mg via ORAL
  Filled 2016-07-19 (×4): qty 2

## 2016-07-19 MED ORDER — ONDANSETRON HCL 4 MG PO TABS: 4.0000 mg | ORAL_TABLET | Freq: Four times a day (QID) | ORAL | Status: DC | PRN

## 2016-07-19 MED ORDER — ACETAMINOPHEN 650 MG RE SUPP: 650.0000 mg | Freq: Four times a day (QID) | RECTAL | Status: DC | PRN

## 2016-07-19 MED ORDER — DEXTROSE 5 % IV SOLN
1.0000 g | INTRAVENOUS | Status: DC
  Administered 2016-07-20: 1 g via INTRAVENOUS
  Filled 2016-07-19 (×2): qty 10

## 2016-07-19 MED ORDER — SODIUM CHLORIDE (HYPERTONIC) 5 % OP SOLN
1.0000 [drp] | Freq: Every day | OPHTHALMIC | Status: DC
  Administered 2016-07-20 – 2016-07-21 (×2): 1 [drp] via OPHTHALMIC
  Filled 2016-07-19: qty 15

## 2016-07-19 MED ORDER — ONDANSETRON HCL 4 MG/2ML IJ SOLN: 4.0000 mg | Freq: Four times a day (QID) | INTRAMUSCULAR | Status: DC | PRN

## 2016-07-19 MED ORDER — CARVEDILOL 3.125 MG PO TABS
3.1250 mg | ORAL_TABLET | Freq: Two times a day (BID) | ORAL | Status: DC
  Administered 2016-07-20 – 2016-07-21 (×4): 3.125 mg via ORAL
  Filled 2016-07-19 (×4): qty 1

## 2016-07-19 MED ORDER — ESCITALOPRAM OXALATE 10 MG PO TABS
10.0000 mg | ORAL_TABLET | Freq: Every day | ORAL | Status: DC
  Administered 2016-07-19 – 2016-07-20 (×2): 10 mg via ORAL
  Filled 2016-07-19 (×2): qty 1

## 2016-07-19 MED ORDER — ATORVASTATIN CALCIUM 10 MG PO TABS
10.0000 mg | ORAL_TABLET | Freq: Every day | ORAL | Status: DC
  Administered 2016-07-19 – 2016-07-20 (×2): 10 mg via ORAL
  Filled 2016-07-19 (×2): qty 1

## 2016-07-19 MED ORDER — ONDANSETRON HCL 4 MG/2ML IJ SOLN
4.0000 mg | Freq: Once | INTRAMUSCULAR | Status: AC
  Administered 2016-07-19: 4 mg via INTRAVENOUS
  Filled 2016-07-19: qty 2

## 2016-07-19 MED ORDER — CLOPIDOGREL BISULFATE 75 MG PO TABS
75.0000 mg | ORAL_TABLET | Freq: Every day | ORAL | Status: DC
  Administered 2016-07-20 – 2016-07-21 (×2): 75 mg via ORAL
  Filled 2016-07-19 (×2): qty 1

## 2016-07-19 MED ORDER — LEVOTHYROXINE SODIUM 75 MCG PO TABS
75.0000 ug | ORAL_TABLET | Freq: Every day | ORAL | Status: DC
  Administered 2016-07-20 – 2016-07-21 (×2): 75 ug via ORAL
  Filled 2016-07-19 (×2): qty 1

## 2016-07-19 MED ORDER — CEFTRIAXONE SODIUM 1 G IJ SOLR
1.0000 g | Freq: Once | INTRAMUSCULAR | Status: AC
  Administered 2016-07-19: 1 g via INTRAVENOUS
  Filled 2016-07-19: qty 10

## 2016-07-19 MED ORDER — PANTOPRAZOLE SODIUM 40 MG PO TBEC
40.0000 mg | DELAYED_RELEASE_TABLET | Freq: Every day | ORAL | Status: DC
  Administered 2016-07-20 – 2016-07-21 (×2): 40 mg via ORAL
  Filled 2016-07-19 (×2): qty 1

## 2016-07-19 MED ORDER — LORAZEPAM 0.5 MG PO TABS
0.5000 mg | ORAL_TABLET | Freq: Three times a day (TID) | ORAL | Status: DC | PRN
  Administered 2016-07-19 – 2016-07-21 (×4): 0.5 mg via ORAL
  Filled 2016-07-19 (×4): qty 1

## 2016-07-19 MED ORDER — MECLIZINE HCL 25 MG PO TABS
25.0000 mg | ORAL_TABLET | Freq: Three times a day (TID) | ORAL | Status: DC
  Administered 2016-07-20 – 2016-07-21 (×6): 25 mg via ORAL
  Filled 2016-07-19 (×8): qty 1

## 2016-07-19 MED ORDER — HYDROCORTISONE 2.5 % RE CREA
TOPICAL_CREAM | Freq: Four times a day (QID) | RECTAL | Status: DC
  Administered 2016-07-19: 21:00:00 via RECTAL
  Filled 2016-07-19: qty 28.35

## 2016-07-19 NOTE — ED Notes (Signed)
Placed pt onto bedpan. 

## 2016-07-19 NOTE — ED Notes (Signed)
Patient  Cleaned and given blankets.

## 2016-07-19 NOTE — H&P (Signed)
History and Physical    Cassandra [name] VHQ:[id number] DOB: 1946/07/26 DOA: 07/19/2016  PCP: Irving Copas, MD   Patient coming from: Home  Chief Complaint: Increase lethargy, weakness, altered mental status  HPI: Cassandra [name] is a 70 y.o. woman with a history of Type 1 DM (she uses long and short acting subQ insulin injections), CKD 3 secondary to DM, HTN, hypothyroidism, and recent discharge on yesterday after being admitted for management of acute exacerbation of diastolic heart failure and acute left thalamus CVA.  She was diuresed with IV lasix.  She was discharged home in stable condition.  This morning, she reports having an AM blood sugar that was greater than 600, so she took 10 units of lantus and 10 units of humalog.  She does not remember if she ate breakfast.  By this afternoon, she was demonstrating increased lethargy and weakness, which was noticed by her husband.  She does not recall LOC, but she cannot recall specific events.  She knows that her blood sugars was found to be in the 30's, and her husband called 911 for transport to the ED.  She reports urinary frequency and increased odor.  She says that she did not have a foley catheter during her previous hospitalization.  She denies chest pain or shortness of breath.  She is complaining of rectal pain, burning, and bleeding related to external hemorrhoids.  She is asking for something to eat.  ED Course: Patient's blood sugar improved with juice and peanut butter.  She did not required D50.  Labs show AKI and she has evidence of a UTI.  She has received 1L of NS in the ED and IV Rocephin.  Urine culture ordered.  Hospitalist asked to admit.  Review of Systems: As per HPI otherwise 10 point review of systems negative.    Past Medical History:  Diagnosis Date  . Allergic rhinitis   . Anxiety   . Benign hypertension with CKD (chronic kidney disease) stage III   . Benign paroxysmal positional vertigo   . Bipolar  disorder (HCC)   . Broken finger   . Broken shoulder   . Broken toes   . Cervicalgia   . CKD (chronic kidney disease) stage 3, GFR 30-59 ml/min   . Depression   . Diabetes mellitus without complication (HCC)   . Elevated liver enzymes Hep B/C neg 2014  . GERD (gastroesophageal reflux disease)   . Glaucoma   . History of alcohol use   . Hypertension   . Hypothyroidism   . Interstitial cystitis    bladder stretched every 9 months  . Migraines   . Psoriasis   . Stroke (HCC) 05/16/2016   Left occipital and thalamic, right hippocampal  . Thyroid disease   . Tobacco use   . 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 Vitamin B12 deficiency     Past Surgical History:  Procedure Laterality Date  . ABDOMINAL HYSTERECTOMY     with oophorectomy  . APPENDECTOMY    . BLADDER SURGERY    . CATARACT EXTRACTION    . CERVICAL DISC SURGERY    . CHOLECYSTECTOMY    . HERNIA REPAIR    . TONSILLECTOMY       reports that she has been smoking Cigarettes.  She has been smoking about 0.25 packs per day. She has never used smokeless tobacco. She reports that she does not drink alcohol or use drugs.  She is still smoking.  She is  married.  She has two daughters.  Allergies  Allergen Reactions  . Ciprofloxacin Other (See Comments)    Pt does not recall specific reaction  . Codeine Diarrhea and Nausea And Vomiting  . Doxycycline Diarrhea and Nausea And Vomiting  . Hydrocodone Other (See Comments)    Pt does not recall specific reaction  . Omnicef [Cefdinir] Nausea Only and Other (See Comments)    Constipation, tolerated Zosyn    Family History  Problem Relation Age of Onset  . Alcohol abuse Mother   . Arthritis Mother   . Asthma Mother   . Cancer Mother     colon cancer  . Hypertension Mother   . Migraines Mother   . Stroke Mother   . Lung disease Mother   . COPD Mother   . Diabetes Father   . Hypertension Father   . Heart disease Father   . Heart attack Father     . Heart disease Paternal Grandmother   . Diabetes Paternal Grandmother   . Stroke Paternal Grandmother   . Cancer Paternal Grandmother   . Diabetes Paternal Grandfather      Prior to Admission medications   Medication Sig Start Date End Date Taking? Authorizing Provider  aspirin EC 81 MG EC tablet Take 1 tablet (81 mg total) by mouth daily. 05/19/16   Rolly Salter, MD  atorvastatin (LIPITOR) 10 MG tablet Take 1 tablet (10 mg total) by mouth daily at 6 PM. Patient taking differently: Take 10 mg by mouth at bedtime.  05/19/16   Rolly Salter, MD  carvedilol (COREG) 3.125 MG tablet Take 1 tablet (3.125 mg total) by mouth 2 (two) times daily with a meal. 07/18/16   Mauricio Annett Gula, MD  cetirizine (ZYRTEC) 10 MG tablet Take 10 mg by mouth daily as needed for allergies.    Historical Provider, MD  clopidogrel (PLAVIX) 75 MG tablet Take 1 tablet (75 mg total) by mouth daily. 05/19/16 08/19/16  Rolly Salter, MD  cyanocobalamin (,VITAMIN B-12,) 1000 MCG/ML injection Inject 1,000 mcg into the muscle every 30 (thirty) days. Last injection approx 3rd week of June, 2017    Historical Provider, MD  escitalopram (LEXAPRO) 10 MG tablet Take 1 tablet (10 mg total) by mouth at bedtime. 05/19/16   Rolly Salter, MD  feeding supplement, GLUCERNA SHAKE, (GLUCERNA SHAKE) LIQD Take 237 mLs by mouth 3 (three) times daily between meals. Patient not taking: Reported on 06/22/2016 05/19/16   Rolly Salter, MD  folic acid (FOLVITE) 1 MG tablet Take 1 tablet (1 mg total) by mouth daily. 05/19/16   Rolly Salter, MD  furosemide (LASIX) 20 MG tablet Take 1 tablet (20 mg total) by mouth daily. 05/19/16   Rolly Salter, MD  hydrocortisone (ANUSOL-HC) 2.5 % rectal cream Place rectally 4 (four) times daily. Patient not taking: Reported on 06/22/2016 05/19/16   Rolly Salter, MD  insulin aspart (NOVOLOG) 100 UNIT/ML injection Inject 0-15 Units into the skin 3 (three) times daily with meals. 07/18/16   Mauricio Annett Gula, MD   insulin glargine (LANTUS) 100 UNIT/ML injection Inject 0.05 mLs (5 Units total) into the skin daily. 07/18/16   Mauricio Annett Gula, MD  iron polysaccharides (NIFEREX) 150 MG capsule Take 1 capsule (150 mg total) by mouth 2 (two) times daily. Patient taking differently: Take 150 mg by mouth daily.  06/28/16   Vassie Loll, MD  levothyroxine (SYNTHROID, LEVOTHROID) 75 MCG tablet Take 75 mcg by mouth daily. 05/30/16  Historical Provider, MD  LORazepam (ATIVAN) 0.5 MG tablet Take 1 tablet (0.5 mg total) by mouth every 8 (eight) hours as needed for anxiety. 05/19/16   Rolly Salter, MD  meclizine (ANTIVERT) 25 MG tablet Take 1 tablet (25 mg total) by mouth 3 (three) times daily as needed for dizziness. Patient taking differently: Take 25 mg by mouth 3 (three) times daily.  02/12/16   Chilton Si, MD  pantoprazole (PROTONIX) 40 MG tablet Take 1 tablet (40 mg total) by mouth 2 (two) times daily. Patient taking differently: Take 40 mg by mouth 2 (two) times daily as needed (acid reflux).  05/19/16   Rolly Salter, MD  polyethylene glycol (MIRALAX / Ethelene Hal) packet Take 17 g by mouth every other day. 05/19/16   Rolly Salter, MD  saccharomyces boulardii (FLORASTOR) 250 MG capsule Take 1 capsule (250 mg total) by mouth 2 (two) times daily. 05/19/16   Rolly Salter, MD  sodium chloride (MURO 128) 5 % ophthalmic solution Place 1 drop into both eyes daily.    Historical Provider, MD  thiamine 100 MG tablet Take 1 tablet (100 mg total) by mouth daily. 05/19/16   Rolly Salter, MD    Physical Exam: Vitals:   07/19/16 1830 07/19/16 1845 07/19/16 1900 07/19/16 1915  BP: 175/86 188/79 168/70 160/93  Pulse: 83 90 80 89  Resp: 14 17 16 16   Temp:      TempSrc:      SpO2: 100% 100% 100% 100%  Weight:          Constitutional: NAD, calm, comfortable, pale but NONtoxic appearing Vitals:   07/19/16 1830 07/19/16 1845 07/19/16 1900 07/19/16 1915  BP: 175/86 188/79 168/70 160/93  Pulse: 83 90 80 89  Resp:  14 17 16 16   Temp:      TempSrc:      SpO2: 100% 100% 100% 100%  Weight:       Eyes: PERRL, lids and conjunctivae normal ENMT: Mucous membranes are moist. Posterior pharynx clear of any exudate or lesions. Neck: normal appearance, supple Respiratory: clear to auscultation bilaterally, Normal respiratory effort. No accessory muscle use.  Cardiovascular: Normal rate, regular rhythm, no murmurs / rubs / gallops. No extremity edema. 2+ pedal pulses.  GI: abdomen is soft and compressible.  No distention.  No tenderness.  Bowel sounds are present. Musculoskeletal:  No joint deformity in upper and lower extremities. Good ROM, no contractures. Normal muscle tone.  Skin: no rashes, warm and dry Neurologic: No focal deficits. Psychiatric: Normal judgment and insight. Alert and oriented x 3. Normal mood.     Labs on Admission: I have personally reviewed following labs and imaging studies  CBC:  Recent Labs Lab 07/19/16 1635  WBC 9.0  NEUTROABS 5.8  HGB 9.6*  HCT 28.8*  MCV 92.0  PLT 277   Basic Metabolic Panel:  Recent Labs Lab 07/14/16 0547 07/16/16 0558 07/17/16 0516 07/18/16 0510 07/19/16 1635  NA 138 134* 140 141 137  K 4.5 4.8 4.9 3.9 4.6  CL 104 103 108 106 99*  CO2 29 24 28 28 28   GLUCOSE 115* 400* 207* 37* 79  BUN 24* 25* 23* 19 31*  CREATININE 1.46* 1.25* 1.25* 1.04* 1.58*  CALCIUM 8.8* 8.0* 8.3* 8.9 9.7   GFR: Estimated Creatinine Clearance: 25.1 mL/min (by C-G formula based on SCr of 1.58 mg/dL). Liver Function Tests:  Recent Labs Lab 07/19/16 1635  AST 28  ALT 15  ALKPHOS 63  BILITOT 0.5  PROT 6.7  ALBUMIN 3.5   CBG:  Recent Labs Lab 07/18/16 0614 07/18/16 0652 07/18/16 1111 07/19/16 1714 07/19/16 1749  GLUCAP 36* 93 395* 37* 98   Urine analysis:    Component Value Date/Time   COLORURINE YELLOW 07/19/2016 1749   APPEARANCEUR CLOUDY (A) 07/19/2016 1749   APPEARANCEUR Clear 06/15/2014 1508   LABSPEC 1.016 07/19/2016 1749   LABSPEC  1.010 06/15/2014 1508   PHURINE 7.5 07/19/2016 1749   GLUCOSEU 100 (A) 07/19/2016 1749   GLUCOSEU Negative 06/15/2014 1508   HGBUR MODERATE (A) 07/19/2016 1749   BILIRUBINUR NEGATIVE 07/19/2016 1749   BILIRUBINUR Negative 06/15/2014 1508   KETONESUR 15 (A) 07/19/2016 1749   PROTEINUR >300 (A) 07/19/2016 1749   UROBILINOGEN 0.2 09/14/2015 1617   NITRITE NEGATIVE 07/19/2016 1749   LEUKOCYTESUR SMALL (A) 07/19/2016 1749   LEUKOCYTESUR Negative 06/15/2014 1508    Radiological Exams on Admission: No results found.  EKG: Independently reviewed. NSR.  No acute changes.  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)   Hypertension   CKD (chronic kidney disease) stage 3, GFR 30-59 ml/min   Hypothyroidism   Tobacco use   Cerebral thrombosis with cerebral infarction   UTI (lower urinary tract infection)   Chronic diastolic heart failure (HCC)      AKI on CKD 3, secondary to diuresis during last admission --Hold lasix --NS at 75cc/hr x 10 hours --Repeat BMP in the AM  UTI --Empiric Rocephin --Urine culture pending  Hypoglycemia --diabetic diet --Lantus 5 units qAM, sensitive sliding scale insulin coverage for now  Recent CVA --Continue baby ASA, plavix, statin  HTN --Continue home dose of Coreg, she may need a prn   DVT prophylaxis: SCDs  Code Status: FULL Family Communication: Patient alone at time of admission Disposition Plan: Expect she will return home when ready Consults called: NONE Admission status: Observation, med surg   TIME SPENT: 70 minutes   Jerene Bears MD [facility] Pager [phone]  If 7PM-7AM, please contact night-coverage [email or web address] Password [email or web address]  07/19/2016, 8:02 PM

## 2016-07-19 NOTE — ED Notes (Signed)
Pt had BM. Changed pts brief

## 2016-07-19 NOTE — ED Notes (Signed)
Patient given warm blanket.

## 2016-07-19 NOTE — ED Notes (Signed)
MD aware of patients CBG. Patient alert and oriented x4. Patient given OJ and graham crackers and peanut butter.

## 2016-07-19 NOTE — ED Notes (Signed)
Patient states last time she ate breakfast.

## 2016-07-19 NOTE — ED Notes (Signed)
Attempted to call report

## 2016-07-19 NOTE — ED Provider Notes (Signed)
[facility]-EMERGENCY DEPT Provider Note   CSN: [id number] Arrival date & time: 07/19/16  1600  First Provider Contact:  None       History   Chief Complaint Chief Complaint  Patient presents with  . Weakness  . Congestive Heart Failure    HPI Cassandra [name] is a 70 y.o. female.  The history is provided by the patient. No language interpreter was used.  Weakness  This is a recurrent problem. The current episode started yesterday. The problem occurs constantly. The problem has been gradually worsening. Associated symptoms include abdominal pain (mild, non specific) and headaches (chronic). Pertinent negatives include no chest pain and no shortness of breath. Exacerbated by: activity. The symptoms are relieved by rest. She has tried rest for the symptoms. The treatment provided mild relief.    Past Medical History:  Diagnosis Date  . Allergic rhinitis   . Anxiety   . Benign hypertension with CKD (chronic kidney disease) stage III   . Benign paroxysmal positional vertigo   . Bipolar disorder (HCC)   . Broken finger   . Broken shoulder   . Broken toes   . Cervicalgia   . CKD (chronic kidney disease) stage 3, GFR 30-59 ml/min   . Depression   . Diabetes mellitus without complication (HCC)   . Elevated liver enzymes Hep B/C neg 2014  . GERD (gastroesophageal reflux disease)   . Glaucoma   . History of alcohol use   . Hypertension   . Hypothyroidism   . Interstitial cystitis    bladder stretched every 9 months  . Migraines   . Psoriasis   . Stroke (HCC) 05/16/2016   Left occipital and thalamic, right hippocampal  . Thyroid disease   . Tobacco use   . 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 Vitamin B12 deficiency     Patient Active Problem List   Diagnosis Date Noted  . Dehydration   . Cerebral embolism with cerebral infarction 07/13/2016  . Acute diastolic CHF (congestive heart failure) (HCC) 07/11/2016  . Cerebrovascular accident (CVA) (HCC)   .  Essential hypertension   . HLD (hyperlipidemia)   . Anemia of chronic disease   . Physical deconditioning   . Acute renal failure superimposed on stage 3 chronic kidney disease (HCC)   . Diabetic ketoacidosis with coma associated with type 1 diabetes mellitus (HCC)   . History of ETT   . Stroke (HCC) 05/16/2016  . Cerebral thrombosis with cerebral infarction 05/15/2016  . Acute on chronic renal failure (HCC)   . Generalized anxiety disorder 05/10/2016  . DKA (diabetic ketoacidoses) (HCC) 05/06/2016  . Acute respiratory failure (HCC) 05/06/2016  . Acute respiratory failure with hypoxia (HCC) 05/06/2016  . Acute encephalopathy 05/06/2016  . Septic shock (HCC)   . Acute respiratory failure with hypoxia and hypercapnia (HCC)   . 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 Depression   . Hypertension   . CKD (chronic kidney disease) stage 3, GFR 30-59 ml/min   . Hypothyroidism   . Vitamin B12 deficiency   . Benign paroxysmal positional vertigo   . Anxiety   . Allergic rhinitis   . Glaucoma   . Benign hypertension with CKD (chronic kidney disease) stage III   . Tobacco use   . Cervicalgia   . Elevated liver enzymes   . History of alcohol use     Past Surgical History:  Procedure Laterality Date  . ABDOMINAL HYSTERECTOMY  with oophorectomy  . APPENDECTOMY    . BLADDER SURGERY    . CATARACT EXTRACTION    . CERVICAL DISC SURGERY    . CHOLECYSTECTOMY    . HERNIA REPAIR    . TONSILLECTOMY      OB History    No data available       Home Medications    Prior to Admission medications   Medication Sig Start Date End Date Taking? Authorizing Provider  aspirin EC 81 MG EC tablet Take 1 tablet (81 mg total) by mouth daily. 05/19/16   Rolly Salter, MD  atorvastatin (LIPITOR) 10 MG tablet Take 1 tablet (10 mg total) by mouth daily at 6 PM. Patient taking differently: Take 10 mg by mouth at bedtime.  05/19/16   Rolly Salter, MD  carvedilol (COREG) 3.125 MG  tablet Take 1 tablet (3.125 mg total) by mouth 2 (two) times daily with a meal. 07/18/16   Mauricio Annett Gula, MD  cetirizine (ZYRTEC) 10 MG tablet Take 10 mg by mouth daily as needed for allergies.    Historical Provider, MD  clopidogrel (PLAVIX) 75 MG tablet Take 1 tablet (75 mg total) by mouth daily. 05/19/16 08/19/16  Rolly Salter, MD  cyanocobalamin (,VITAMIN B-12,) 1000 MCG/ML injection Inject 1,000 mcg into the muscle every 30 (thirty) days. Last injection approx 3rd week of June, 2017    Historical Provider, MD  escitalopram (LEXAPRO) 10 MG tablet Take 1 tablet (10 mg total) by mouth at bedtime. 05/19/16   Rolly Salter, MD  feeding supplement, GLUCERNA SHAKE, (GLUCERNA SHAKE) LIQD Take 237 mLs by mouth 3 (three) times daily between meals. Patient not taking: Reported on 06/22/2016 05/19/16   Rolly Salter, MD  folic acid (FOLVITE) 1 MG tablet Take 1 tablet (1 mg total) by mouth daily. 05/19/16   Rolly Salter, MD  furosemide (LASIX) 20 MG tablet Take 1 tablet (20 mg total) by mouth daily. 05/19/16   Rolly Salter, MD  hydrocortisone (ANUSOL-HC) 2.5 % rectal cream Place rectally 4 (four) times daily. Patient not taking: Reported on 06/22/2016 05/19/16   Rolly Salter, MD  insulin aspart (NOVOLOG) 100 UNIT/ML injection Inject 0-15 Units into the skin 3 (three) times daily with meals. 07/18/16   Mauricio Annett Gula, MD  insulin glargine (LANTUS) 100 UNIT/ML injection Inject 0.05 mLs (5 Units total) into the skin daily. 07/18/16   Mauricio Annett Gula, MD  iron polysaccharides (NIFEREX) 150 MG capsule Take 1 capsule (150 mg total) by mouth 2 (two) times daily. Patient taking differently: Take 150 mg by mouth daily.  06/28/16   Vassie Loll, MD  levothyroxine (SYNTHROID, LEVOTHROID) 75 MCG tablet Take 75 mcg by mouth daily. 05/30/16   Historical Provider, MD  LORazepam (ATIVAN) 0.5 MG tablet Take 1 tablet (0.5 mg total) by mouth every 8 (eight) hours as needed for anxiety. 05/19/16   Rolly Salter, MD    meclizine (ANTIVERT) 25 MG tablet Take 1 tablet (25 mg total) by mouth 3 (three) times daily as needed for dizziness. Patient taking differently: Take 25 mg by mouth 3 (three) times daily.  02/12/16   Chilton Si, MD  pantoprazole (PROTONIX) 40 MG tablet Take 1 tablet (40 mg total) by mouth 2 (two) times daily. Patient taking differently: Take 40 mg by mouth 2 (two) times daily as needed (acid reflux).  05/19/16   Rolly Salter, MD  polyethylene glycol (MIRALAX / Ethelene Hal) packet Take 17 g by mouth every other  day. 05/19/16   Rolly Salter, MD  saccharomyces boulardii (FLORASTOR) 250 MG capsule Take 1 capsule (250 mg total) by mouth 2 (two) times daily. 05/19/16   Rolly Salter, MD  sodium chloride (MURO 128) 5 % ophthalmic solution Place 1 drop into both eyes daily.    Historical Provider, MD  thiamine 100 MG tablet Take 1 tablet (100 mg total) by mouth daily. 05/19/16   Rolly Salter, MD    Family History Family History  Problem Relation Age of Onset  . Alcohol abuse Mother   . Arthritis Mother   . Asthma Mother   . Cancer Mother     colon cancer  . Hypertension Mother   . Migraines Mother   . Stroke Mother   . Lung disease Mother   . COPD Mother   . Diabetes Father   . Hypertension Father   . Heart disease Father   . Heart attack Father   . Heart disease Paternal Grandmother   . Diabetes Paternal Grandmother   . Stroke Paternal Grandmother   . Cancer Paternal Grandmother   . Diabetes Paternal Grandfather     Social History Social History  Substance Use Topics  . Smoking status: Current Every Day Smoker    Packs/day: 0.25    Types: Cigarettes  . Smokeless tobacco: Never Used  . Alcohol use No     Comment: Hasn't had any alcohol since last admission     Allergies   Ciprofloxacin; Codeine; Doxycycline; Hydrocodone; and Omnicef [cefdinir]   Review of Systems Review of Systems  Constitutional: Positive for fatigue. Negative for chills and fever.  HENT: Negative  for congestion and rhinorrhea.   Eyes: Negative.   Respiratory: Negative for shortness of breath.   Cardiovascular: Negative for chest pain.  Gastrointestinal: Positive for abdominal pain (mild, non specific), constipation and nausea. Negative for diarrhea and vomiting.  Genitourinary: Negative for dysuria.  Musculoskeletal: Negative.   Skin: Negative.   Neurological: Positive for weakness, light-headedness and headaches (chronic). Negative for syncope.  Psychiatric/Behavioral: Negative.      Physical Exam Updated Vital Signs BP 178/78 (BP Location: Right Arm)   Pulse 71   Temp 98.9 F (37.2 C) (Oral)   Resp 16   SpO2 100%   Physical Exam  Constitutional: She is oriented to person, place, and time. She appears well-developed and well-nourished. No distress.   But alert and oriented  HENT:  Head: Normocephalic and atraumatic.  Eyes: Conjunctivae are normal.  Neck: Normal range of motion. Neck supple. No tracheal deviation present.  Cardiovascular: Normal rate, regular rhythm and intact distal pulses.   Pulmonary/Chest: Effort normal and breath sounds normal. No stridor. No respiratory distress. She has no wheezes. She has no rales.  Abdominal: Soft. She exhibits no distension. There is tenderness (mild throughout). There is no rebound and no guarding.  Musculoskeletal: She exhibits no edema or tenderness.  Neurological: She is alert and oriented to person, place, and time. GCS eye subscore is 4. GCS verbal subscore is 5. GCS motor subscore is 6.  MAEI  Skin: Skin is warm and dry. She is not diaphoretic.  Psychiatric:  Slowed responses but appropriate  Nursing note and vitals reviewed.    ED Treatments / Results  Labs (all labs ordered are listed, but only abnormal results are displayed) Labs Reviewed  URINE CULTURE - Abnormal; Notable for the following:       Result Value   Culture >=100,000 COLONIES/mL KLEBSIELLA PNEUMONIAE (*)  Organism ID, Bacteria KLEBSIELLA  PNEUMONIAE (*)    All other components within normal limits  CBC WITH DIFFERENTIAL/PLATELET - Abnormal; Notable for the following:    RBC 3.13 (*)    Hemoglobin 9.6 (*)    HCT 28.8 (*)    All other components within normal limits  COMPREHENSIVE METABOLIC PANEL - Abnormal; Notable for the following:    Chloride 99 (*)    BUN 31 (*)    Creatinine, Ser 1.58 (*)    GFR calc non Af Amer 33 (*)    GFR calc Af Amer 38 (*)    All other components within normal limits  URINALYSIS, ROUTINE W REFLEX MICROSCOPIC (NOT AT [facility] - Abnormal; Notable for the following:    APPearance CLOUDY (*)    Glucose, UA 100 (*)    Hgb urine dipstick MODERATE (*)    Ketones, ur 15 (*)    Protein, ur >300 (*)    Leukocytes, UA SMALL (*)    All other components within normal limits  URINE MICROSCOPIC-ADD ON - Abnormal; Notable for the following:    Squamous Epithelial / LPF 0-5 (*)    Bacteria, UA MANY (*)    All other components within normal limits  CBC - Abnormal; Notable for the following:    RBC 2.57 (*)    Hemoglobin 7.9 (*)    HCT 24.2 (*)    All other components within normal limits  BASIC METABOLIC PANEL - Abnormal; Notable for the following:    Glucose, Bld 330 (*)    BUN 27 (*)    Creatinine, Ser 1.51 (*)    Calcium 8.4 (*)    GFR calc non Af Amer 34 (*)    GFR calc Af Amer 40 (*)    All other components within normal limits  GLUCOSE, CAPILLARY - Abnormal; Notable for the following:    Glucose-Capillary 240 (*)    All other components within normal limits  GLUCOSE, CAPILLARY - Abnormal; Notable for the following:    Glucose-Capillary 125 (*)    All other components within normal limits  GLUCOSE, CAPILLARY - Abnormal; Notable for the following:    Glucose-Capillary 103 (*)    All other components within normal limits  BASIC METABOLIC PANEL - Abnormal; Notable for the following:    Glucose, Bld 260 (*)    Creatinine, Ser 1.28 (*)    Calcium 8.1 (*)    GFR calc non Af Amer 42 (*)    GFR  calc Af Amer 49 (*)    All other components within normal limits  CBC - Abnormal; Notable for the following:    RBC 2.39 (*)    Hemoglobin 7.5 (*)    HCT 22.6 (*)    All other components within normal limits  GLUCOSE, CAPILLARY - Abnormal; Notable for the following:    Glucose-Capillary 251 (*)    All other components within normal limits  GLUCOSE, CAPILLARY - Abnormal; Notable for the following:    Glucose-Capillary 253 (*)    All other components within normal limits  GLUCOSE, CAPILLARY - Abnormal; Notable for the following:    Glucose-Capillary 62 (*)    All other components within normal limits  CBG MONITORING, ED - Abnormal; Notable for the following:    Glucose-Capillary 37 (*)    All other components within normal limits  I-STAT VENOUS BLOOD GAS, ED - Abnormal; Notable for the following:    pH, Ven 7.503 (*)    pCO2, Ven 35.0 (*)  Bicarbonate 27.5 (*)    Acid-Base Excess 4.0 (*)    All other components within normal limits  GLUCOSE, CAPILLARY  CBG MONITORING, ED    EKG  EKG Interpretation  Date/Time:  Friday July 19 2016 16:12:15 EDT Ventricular Rate:  74 PR Interval:    QRS Duration: 76 QT Interval:  387 QTC Calculation: 430 R Axis:   58 Text Interpretation:  Sinus rhythm Normal ECG Confirmed by KNOTT MD, DANIEL ([id number]) on 07/19/2016 4:42:44 PM       Radiology No results found.  Procedures Procedures (including critical care time)  Medications Ordered in ED Medications  0.9 %  sodium chloride infusion ( Intravenous Stopped 07/20/16 1600)  ondansetron (ZOFRAN) injection 4 mg (4 mg Intravenous Given 07/19/16 1828)  sodium chloride 0.9 % bolus 1,000 mL (1,000 mLs Intravenous Transfusing/Transfer 07/19/16 2128)  cefTRIAXone (ROCEPHIN) 1 g in dextrose 5 % 50 mL IVPB (0 g Intravenous Stopped 07/19/16 1926)     Initial Impression / Assessment and Plan / ED Course  I have reviewed the triage vital signs and the nursing notes.  Pertinent labs & imaging results  that were available during my care of the patient were reviewed by me and considered in my medical decision making (see chart for details).  Clinical Course   70 year old female with a past medical history of CKD 3, depression, IDDM, hypertension, hypothyroidism and history of stroke presents to the emergency department, 1 day after being discharged, for worsening weakness.  Patient also states that she was nauseated yesterday and had diffuse mild abdominal pain.  Denied any fevers or chills. Worked up a possible metabolic cause initially.  Screened patient with basic blood work and glucose level was found to be 38.  Patient responded well to oral correction.  UA was sent and was found to be concerning for urinary tract infection.  Creatinine was also found to be elevated.  Started patient on Rocephin after obtaining urine culture and was given 1 L fluid as well.  Patient remained normoglycemic in the ED.  Discussed admission with the hospitalist.  Patient was made aware of the need for rehospitalization for optimization.  All questions answered.  Patient agrees with plan.  Patient and vital signs remained stable during ED stay.  Final Clinical Impressions(s) / ED Diagnoses   Final diagnoses:  None  Weakness UTI Hypoglycemia AKI  New Prescriptions New Prescriptions   No medications on file     Maretta Bees, MD 07/21/16 2143    Lyndal Pulley, MD 07/22/16 [phone]

## 2016-07-19 NOTE — ED Notes (Signed)
Pt CBG, 37. Nurse was notified. Pt given two orange juice with crackers and peanut butter. Recheck CBG in 15 mins per nurse.

## 2016-07-19 NOTE — ED Triage Notes (Signed)
Patient comes from home with complaints of generalized weakness an CHF states she was discharged yesterday from the hospital and did not get the medications filled and is now felling worst. Patient complains of 10/10 pain in the rectum.

## 2016-07-20 DIAGNOSIS — N183 Chronic kidney disease, stage 3 (moderate): Secondary | ICD-10-CM | POA: Diagnosis not present

## 2016-07-20 DIAGNOSIS — I5032 Chronic diastolic (congestive) heart failure: Secondary | ICD-10-CM

## 2016-07-20 DIAGNOSIS — N390 Urinary tract infection, site not specified: Secondary | ICD-10-CM | POA: Diagnosis not present

## 2016-07-20 DIAGNOSIS — I6330 Cerebral infarction due to thrombosis of unspecified cerebral artery: Secondary | ICD-10-CM | POA: Diagnosis not present

## 2016-07-20 DIAGNOSIS — N179 Acute kidney failure, unspecified: Secondary | ICD-10-CM | POA: Diagnosis not present

## 2016-07-20 DIAGNOSIS — E1022 Type 1 diabetes mellitus with diabetic chronic kidney disease: Secondary | ICD-10-CM | POA: Diagnosis not present

## 2016-07-20 LAB — BASIC METABOLIC PANEL
ANION GAP: 7 (ref 5–15)
BUN: 27 mg/dL — ABNORMAL HIGH (ref 6–20)
CALCIUM: 8.4 mg/dL — AB (ref 8.9–10.3)
CO2: 26 mmol/L (ref 22–32)
Chloride: 104 mmol/L (ref 101–111)
Creatinine, Ser: 1.51 mg/dL — ABNORMAL HIGH (ref 0.44–1.00)
GFR calc non Af Amer: 34 mL/min — ABNORMAL LOW (ref >60)
GFR, EST AFRICAN AMERICAN: 40 mL/min — AB (ref >60)
GLUCOSE: 330 mg/dL — AB (ref 65–99)
POTASSIUM: 4.7 mmol/L (ref 3.5–5.1)
Sodium: 137 mmol/L (ref 135–145)

## 2016-07-20 LAB — CBC
HEMATOCRIT: 24.2 % — AB (ref 36.0–46.0)
HEMOGLOBIN: 7.9 g/dL — AB (ref 12.0–15.0)
MCH: 30.7 pg (ref 26.0–34.0)
MCHC: 32.6 g/dL (ref 30.0–36.0)
MCV: 94.2 fL (ref 78.0–100.0)
Platelets: 231 10*3/uL (ref 150–400)
RBC: 2.57 MIL/uL — AB (ref 3.87–5.11)
RDW: 13.5 % (ref 11.5–15.5)
WBC: 9.6 10*3/uL (ref 4.0–10.5)

## 2016-07-20 LAB — GLUCOSE, CAPILLARY
GLUCOSE-CAPILLARY: 125 mg/dL — AB (ref 65–99)
GLUCOSE-CAPILLARY: 103 mg/dL — AB (ref 65–99)
Glucose-Capillary: 240 mg/dL — ABNORMAL HIGH (ref 65–99)
Glucose-Capillary: 251 mg/dL — ABNORMAL HIGH (ref 65–99)

## 2016-07-20 NOTE — Progress Notes (Signed)
Inpatient Diabetes Program Recommendations  AACE/ADA: New Consensus Statement on Inpatient Glycemic Control (2015)  Target Ranges:  Prepandial:   less than 140 mg/dL      Peak postprandial:   less than 180 mg/dL (1-2 hours)      Critically ill patients:  140 - 180 mg/dL   Results for [facility], Cassandra [name] (MRN [id number]) as of 07/20/2016 07:51  Ref. Range 07/19/2016 17:14 07/19/2016 17:49  Glucose-Capillary Latest Ref Range: 65 - 99 mg/dL 37 (LL) 98  Results for [facility], Cassandra [name] (MRN [id number]) as of 07/20/2016 07:51  Ref. Range 07/19/2016 16:35 07/20/2016 02:32  Glucose Latest Ref Range: 65 - 99 mg/dL 79 118 (H)    Review of Glycemic Control  Diabetes history: DM 1 Outpatient Diabetes medications: Lantus 5 units qd+ Novolog 0-15 units tid with meals Current orders for Inpatient glycemic control: Lantus 5 units qd + Novolog correction 0-9 units tid with meals  Inpatient Diabetes Program Recommendations:  Patient is type 1 and DM coordinator saw recently in the hospital. Patient was discharged on Lantus 5 units but had been on 10 units previously. Spoke with RN Steward Drone this am to ask for [name] CBG as soon as possible. Last CBG was last pm @ 5:49 with result of 98 and lab shows 330 random glucose this am @ 2:32 am. Am CBG was 240 and will receive correction. Please consider adding Novolog 2 units meal coverage tid if patient eats 50% meal. CBGs also need to be @ hs. Patient would benefit from endocrinology consult after discharge. Patient shared on last admission she was not home long enough to get to endocrinology appt.  Thank you, Billy Fischer. Yehudah Standing, RN, MSN, CDE Inpatient Glycemic Control Team Team Pager [phone] (8am-5pm) 07/20/2016 8:19 AM

## 2016-07-20 NOTE — Progress Notes (Signed)
Pt BP 177/68 and 160/68 pt alert and oriented , asymptomatic, paged on call Dr. Joneen Roach waiting for his response, will continue to monitor no s/s of distress noted at this time.

## 2016-07-20 NOTE — Progress Notes (Signed)
PROGRESS NOTE                                                                                                                                                                                                             Patient Demographics:    Cassandra [name], is a 70 y.o. female, DOB - 10-04-1946, ZOX:[id number]  Admit date - 07/19/2016   Admitting Physician Michael Litter, MD  Outpatient Primary MD for the patient is Irving Copas, MD  LOS - 0  Chief Complaint  Patient presents with  . Weakness       Brief Narrative   70 year old female with history of diabetes mellitus, CK D stage III, hypertension, hypothyroidism, recently discharged with acute diastolic CHF exacerbation and CVA, presents with generalized weakness, hypoglycemia at home, workup significant for UTI.   Subjective:    Cassandra [name] today has, No headache, No chest pain, No abdominal pain - No Nausea, Report generalized weakness.  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  UTI - Continue with Rocephin, follow on urine cultures   diabetes mellitus - Brittle with hypoglycemia prior to admission. - Continue with Lantus 5 units daily, continue with insulin sliding scale, monitor CBG at bedtime. - It'll diabetes mellitus, will have to tolerate elevated CBGs accordance to provide hypoglycemia. - Will need to start following with endocrinology as outpatient.  Recent CVA -  Continue baby ASA, plavix, statin  HTN - Continue home dose of Coreg, she may need a prn  Hypothyroidism - Continue with Synthroid  Chronic diastolic CHF - Monitor closely as of diuresis on gentle hydration  Anemia of chronic disease - Glycohemoglobin at baseline, patient reports some hemorrhoidal bleed, will monitor closely  Code Status : Full  Family Communication  : D/W patient   Disposition Plan  : home when  stable  Consults  :  none  Procedures  : None  DVT Prophylaxis  :   SCDs   Lab Results  Component Value Date   PLT 231 07/20/2016    Antibiotics  :    Anti-infectives    Start     Dose/Rate Route Frequency Ordered Stop   07/20/16 2200  cefTRIAXone (ROCEPHIN) 1 g in dextrose 5 % 50 mL IVPB     1 g 100 mL/hr over 30 Minutes Intravenous Every 24 hours 07/19/16 2219     07/19/16 1845  cefTRIAXone (ROCEPHIN) 1 g in dextrose 5 % 50 mL IVPB     1 g 100 mL/hr over 30 Minutes Intravenous  Once 07/19/16 1839 07/19/16 1926        Objective:   Vitals:   07/19/16 1900 07/19/16 1915 07/19/16 2155 07/20/16 0528  BP: 168/70 160/93 (!) 177/68 (!) 160/68  Pulse: 80 89 80 72  Resp: 16 16 16 15   Temp:   98.4 F (36.9 C) 98.4 F (36.9 C)  TempSrc:   Oral Oral  SpO2: 100% 100% 95% 96%  Weight:   47.2 kg (104 lb)   Height:   5\' 2"  (1.575 m)     Wt Readings from Last 3 Encounters:  07/19/16 47.2 kg (104 lb)  07/18/16 48.6 kg (107 lb 3.2 oz)  06/29/16 52.3 kg (115 lb 6.4 oz)     Intake/Output Summary (Last 24 hours) at 07/20/16 1228 Last data filed at 07/20/16 0901  Gross per 24 hour  Intake             1070 ml  Output              200 ml  Net              870 ml     Physical Exam  Awake Alert, Oriented X 3, No new F.N deficits, Normal affect [place].AT,PERRAL Supple Neck,No JVD, No cervical lymphadenopathy appriciated.  Symmetrical Chest wall movement, Good air movement bilaterally, CTAB RRR,No Gallops,Rubs or new Murmurs, No Parasternal Heave +ve  B.Sounds, Abd Soft, No tenderness, No organomegaly appriciated, No rebound - guarding or rigidity. No Cyanosis, Clubbing or edema, No new Rash or bruise     Data Review:    CBC  Recent Labs Lab 07/19/16 1635 07/20/16 0232  WBC 9.0 9.6  HGB 9.6* 7.9*  HCT 28.8* 24.2*  PLT 277 231  MCV 92.0 94.2  MCH 30.7 30.7  MCHC 33.3 32.6  RDW 13.2 13.5  LYMPHSABS 2.4  --   MONOABS 0.5  --   EOSABS 0.3  --   BASOSABS 0.1  --     Chemistries   Recent Labs Lab 07/16/16 0558 07/17/16 0516 07/18/16 0510 07/19/16 1635 07/20/16 0232  NA 134* 140 141 137 137  K 4.8 4.9 3.9 4.6 4.7  CL 103 108 106 99* 104  CO2 24 28 28 28 26   GLUCOSE 400* 207* 37* 79 330*  BUN 25* 23* 19 31* 27*  CREATININE 1.25* 1.25* 1.04* 1.58* 1.51*  CALCIUM 8.0* 8.3* 8.9 9.7 8.4*  AST  --   --   --  28  --   ALT  --   --   --  15  --   ALKPHOS  --   --   --  63  --   BILITOT  --   --   --  0.5  --    ------------------------------------------------------------------------------------------------------------------ No results for input(s): CHOL, HDL,  LDLCALC, TRIG, CHOLHDL, LDLDIRECT in the last 72 hours.  Lab Results  Component Value Date   HGBA1C 7.2 (H) 07/13/2016   ------------------------------------------------------------------------------------------------------------------ No results for input(s): TSH, T4TOTAL, T3FREE, THYROIDAB in the last 72 hours.  Invalid input(s): FREET3 ------------------------------------------------------------------------------------------------------------------ No results for input(s): VITAMINB12, FOLATE, FERRITIN, TIBC, IRON, RETICCTPCT in the last 72 hours.  Coagulation profile No results for input(s): INR, PROTIME in the last 168 hours.  No results for input(s): DDIMER in the last 72 hours.  Cardiac Enzymes No results for input(s): CKMB, TROPONINI, MYOGLOBIN in the last 168 hours.  Invalid input(s):  CK ------------------------------------------------------------------------------------------------------------------    Component Value Date/Time   BNP 469.1 (H) 07/11/2016 1600    Inpatient Medications  Scheduled Meds: . aspirin EC  81 mg Oral Daily  . atorvastatin  10 mg Oral QHS  . carvedilol  3.125 mg Oral BID WC  . cefTRIAXone (ROCEPHIN)  IV  1 g Intravenous Q24H  . clopidogrel  75 mg Oral Daily  . escitalopram  10 mg Oral QHS  . hydrocortisone   Rectal QID  . insulin aspart  0-9 Units Subcutaneous TID WC  . insulin glargine  5 Units Subcutaneous Daily  . levothyroxine  75 mcg Oral QAC breakfast  . meclizine  25 mg Oral TID  . pantoprazole  40 mg Oral Daily  . sodium chloride  1 drop Both Eyes Daily   Continuous Infusions:  PRN Meds:.acetaminophen **OR** acetaminophen, LORazepam, ondansetron **OR** ondansetron (ZOFRAN) IV, witch hazel-glycerin  Micro Results No results found for this or any previous visit (from the past 240 hour(s)).  Radiology Reports Dg Chest 2 View  Result Date: 07/11/2016 CLINICAL DATA:  Bilateral lower extremity swelling for the past 2 days. EXAM: CHEST  2 VIEW COMPARISON:  06/22/2016; 05/10/2016; chest CT - 05/04/2016 FINDINGS: Grossly unchanged borderline enlarged cardiac silhouette. Normal mediastinal contours. Atherosclerotic plaque when the thoracic aorta. The lungs appear hyperexpanded with flattening of the bilateral hemidiaphragms mild diffuse slightly nodular thickening of the pulmonary interstitium. Interval development of a trace right-sided effusion with associated right basilar heterogeneous opacities. No definite evidence of edema. No acute osseus abnormalities. Post lower cervical ACDF, incompletely evaluated. IMPRESSION: 1. Interval development of a trace right-sided effusion with associated right basilar opacities, atelectasis versus infiltrate. A follow-up chest radiograph in 3 to 4 weeks after treatment is recommended to ensure  resolution. 2. Cardiomegaly without evidence of edema. 3.  Emphysema. (ICD10-J43.9) 4.  Aortic Atherosclerosis (ICD10-170.0) Electronically Signed   By: Simonne Come M.D.   On: 07/11/2016 21:23  Ct Head Wo Contrast  Result Date: 07/11/2016 CLINICAL DATA:  Weakness with dizziness and blurred vision. Headache. EXAM: CT HEAD WITHOUT CONTRAST TECHNIQUE: Contiguous axial images were obtained from the base of the skull through the vertex without intravenous contrast. COMPARISON:  06/22/2016. FINDINGS: There is no evidence for acute hemorrhage, hydrocephalus, mass lesion, or abnormal extra-axial fluid collection. No definite CT evidence for acute infarction. Diffuse loss of parenchymal volume is consistent with atrophy. Patchy low attenuation in the deep hemispheric and periventricular white matter is nonspecific, but likely reflects chronic microvascular ischemic demyelination. The visualized paranasal sinuses and mastoid air cells are clear. IMPRESSION: Stable.  No acute intracranial abnormality. Atrophy with chronic small vessel white matter ischemic disease. Electronically Signed   By: Kennith Center M.D.   On: 07/11/2016 18:00  Ct Head Wo Contrast  Result Date: 06/22/2016 CLINICAL DATA:  70 year old female with acute encephalopathy. EXAM: CT HEAD WITHOUT CONTRAST TECHNIQUE: Contiguous axial images were obtained from  the base of the skull through the vertex without intravenous contrast. COMPARISON:  Head CT 05/06/2016. FINDINGS: Patchy and confluent areas of decreased attenuation are noted throughout the deep and periventricular white matter of the cerebral hemispheres bilaterally, compatible with chronic microvascular ischemic disease. No acute intracranial abnormalities. Specifically, no evidence of acute intracranial hemorrhage, no definite findings of acute/subacute cerebral ischemia, no mass, mass effect, hydrocephalus or abnormal intra or extra-axial fluid collections. Visualized paranasal sinuses and  mastoids are well pneumatized. No acute displaced skull fractures are identified. IMPRESSION: 1. No acute intracranial abnormalities. 2. Chronic microvascular ischemic changes in cerebral white matter redemonstrated, as above. Electronically Signed   By: Trudie Reed M.D.   On: 06/22/2016 22:30   Mr Brain Wo Contrast  Result Date: 07/12/2016 CLINICAL DATA:  Worsening dizziness. EXAM: MRI HEAD WITHOUT CONTRAST TECHNIQUE: Multiplanar, multiecho pulse sequences of the brain and surrounding structures were obtained without intravenous contrast. COMPARISON:  CT 07/11/2016.  MRI 05/16/2016. FINDINGS: Diffusion imaging shows a punctate acute infarction in the lateral thalamus on the left. No other acute infarction. Chronic small-vessel ischemic changes affect the pons. No focal cerebellar insult. Old small vessel infarctions affect the thalami and are present throughout the hemispheric deep white matter. Small old right occipital cortical infarction which was acute in June. No mass lesion, hemorrhage, hydrocephalus or extra-axial collection. No pituitary mass. No inflammatory sinus disease. No skull or skullbase lesion. IMPRESSION: New punctate acute infarction in the left lateral thalamus. No other acute infarction. Old ischemic changes as seen previously, including a right occipital cortical infarction that was acute in June of this year. Electronically Signed   By: Paulina Fusi M.D.   On: 07/12/2016 12:05  Dg Chest Portable 1 View  Result Date: 06/22/2016 CLINICAL DATA:  Cough.  History of pneumonia.  Smoker. EXAM: PORTABLE CHEST 1 VIEW COMPARISON:  05/10/2016 chest radiograph. FINDINGS: Surgical hardware from ACDF overlies the lower cervical spine. Stable cardiomediastinal silhouette with normal heart size. No pneumothorax. No pleural effusion. No pulmonary edema. No acute consolidative airspace disease. IMPRESSION: No active disease. Electronically Signed   By: Delbert Phenix M.D.   On: 06/22/2016 15:40      Damichael Hofman M.D on 07/20/2016 at 12:28 PM  Between 7am to 7pm - Pager - [phone]  After 7pm go to [email or web address] - password [facility]  [facility] -  Office  [phone]

## 2016-07-21 ENCOUNTER — Observation Stay (HOSPITAL_COMMUNITY): Payer: Medicare Other

## 2016-07-21 DIAGNOSIS — N183 Chronic kidney disease, stage 3 (moderate): Secondary | ICD-10-CM | POA: Diagnosis not present

## 2016-07-21 DIAGNOSIS — N179 Acute kidney failure, unspecified: Secondary | ICD-10-CM | POA: Diagnosis not present

## 2016-07-21 DIAGNOSIS — N390 Urinary tract infection, site not specified: Secondary | ICD-10-CM | POA: Diagnosis not present

## 2016-07-21 DIAGNOSIS — E1022 Type 1 diabetes mellitus with diabetic chronic kidney disease: Secondary | ICD-10-CM | POA: Diagnosis not present

## 2016-07-21 LAB — GLUCOSE, CAPILLARY
GLUCOSE-CAPILLARY: 95 mg/dL (ref 65–99)
Glucose-Capillary: 253 mg/dL — ABNORMAL HIGH (ref 65–99)
Glucose-Capillary: 62 mg/dL — ABNORMAL LOW (ref 65–99)

## 2016-07-21 LAB — CBC
HEMATOCRIT: 22.6 % — AB (ref 36.0–46.0)
HEMOGLOBIN: 7.5 g/dL — AB (ref 12.0–15.0)
MCH: 31.4 pg (ref 26.0–34.0)
MCHC: 33.2 g/dL (ref 30.0–36.0)
MCV: 94.6 fL (ref 78.0–100.0)
Platelets: 209 10*3/uL (ref 150–400)
RBC: 2.39 MIL/uL — ABNORMAL LOW (ref 3.87–5.11)
RDW: 13.4 % (ref 11.5–15.5)
WBC: 7.6 10*3/uL (ref 4.0–10.5)

## 2016-07-21 LAB — BASIC METABOLIC PANEL
ANION GAP: 6 (ref 5–15)
BUN: 20 mg/dL (ref 6–20)
CHLORIDE: 106 mmol/L (ref 101–111)
CO2: 26 mmol/L (ref 22–32)
Calcium: 8.1 mg/dL — ABNORMAL LOW (ref 8.9–10.3)
Creatinine, Ser: 1.28 mg/dL — ABNORMAL HIGH (ref 0.44–1.00)
GFR calc Af Amer: 49 mL/min — ABNORMAL LOW (ref >60)
GFR, EST NON AFRICAN AMERICAN: 42 mL/min — AB (ref >60)
GLUCOSE: 260 mg/dL — AB (ref 65–99)
POTASSIUM: 4.3 mmol/L (ref 3.5–5.1)
Sodium: 138 mmol/L (ref 135–145)

## 2016-07-21 LAB — URINE CULTURE

## 2016-07-21 MED ORDER — CEFUROXIME AXETIL 250 MG PO TABS: 250.0000 mg | ORAL_TABLET | Freq: Two times a day (BID) | ORAL | 0 refills | Status: DC

## 2016-07-21 MED ORDER — INSULIN GLARGINE 100 UNIT/ML ~~LOC~~ SOLN: 5.0000 [IU] | Freq: Every day | SUBCUTANEOUS | 0 refills | Status: DC

## 2016-07-21 MED ORDER — WITCH HAZEL-GLYCERIN EX PADS: MEDICATED_PAD | CUTANEOUS | 12 refills | Status: DC | PRN

## 2016-07-21 MED ORDER — HYDRALAZINE HCL 20 MG/ML IJ SOLN: 10.0000 mg | Freq: Four times a day (QID) | INTRAMUSCULAR | Status: DC | PRN

## 2016-07-21 NOTE — Progress Notes (Signed)
[facility]. CM faxed orders to [phone] and confirmation received.

## 2016-07-21 NOTE — Discharge Summary (Signed)
Cassandra [name], is a 70 y.o. female  DOB July 10, 1946  MRN [id number].  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 Allergic rhinitis   . Anxiety   . Benign hypertension with CKD (chronic kidney disease) stage III   . Benign paroxysmal positional vertigo   . Bipolar disorder (HCC)   . Broken finger   . Broken shoulder   . Broken toes   . Cervicalgia   . CKD (chronic kidney disease) stage 3, GFR 30-59 ml/min   . Depression   . Diabetes mellitus without complication (HCC)   . Elevated liver enzymes Hep B/C neg 2014  . GERD (gastroesophageal reflux disease)   . Glaucoma   . History of alcohol use   . Hypertension   . Hypothyroidism   . Interstitial cystitis    bladder stretched every 9 months  . Migraines   . Psoriasis   . Stroke (HCC) 05/16/2016   Left occipital and thalamic, right hippocampal  . Thyroid disease   . Tobacco use   . 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 Vitamin B12 deficiency     Past Surgical History:  Procedure Laterality Date  . ABDOMINAL HYSTERECTOMY     with  oophorectomy  . APPENDECTOMY    . BLADDER SURGERY    . CATARACT EXTRACTION    . CERVICAL DISC SURGERY    . CHOLECYSTECTOMY    . HERNIA REPAIR    . TONSILLECTOMY         History of present illness and  Hospital Course:     Kindly see H&P for history of present illness and admission details, please review complete Labs, Consult reports and Test reports for all details in brief  HPI  from the history and physical done on the day of admission date//2017 HPI: Cassandra [name] is a 70 y.o. woman with a history of Type 1 DM (she uses long and short acting subQ insulin injections), CKD 3 secondary to DM, HTN, hypothyroidism, and recent discharge on yesterday after being admitted for management of acute exacerbation of diastolic heart failure and acute left thalamus CVA.  She was diuresed with IV lasix.  She was discharged home in stable condition.  This morning, she reports having an AM blood sugar that was greater than 600, so she took 10 units of lantus and 10 units of humalog.  She does not remember if she ate breakfast.  By this afternoon, she was demonstrating increased lethargy and weakness, which was noticed by her husband.  She does not recall LOC, but she cannot recall specific events.  She knows that her blood sugars was found to be in the 30's, and her husband called 911 for transport to the ED.  She reports urinary frequency and increased odor.  She says that she did not have a foley catheter during her previous hospitalization.  She denies chest pain or shortness of breath.  She is complaining of rectal pain, burning, and bleeding related to external hemorrhoids.  She is asking for something to eat.  ED Course: Patient's blood sugar improved with juice and peanut butter.  She did not required D50.  Labs show AKI and she has evidence of a UTI.  She has received 1L of NS in the ED and IV Rocephin.  Urine culture ordered.  Hospitalist asked to admit.    Hospital Course   70 year old  female with history of diabetes mellitus, CK D stage III, hypertension, hypothyroidism, recently discharged with acute diastolic CHF exacerbation and CVA, presents with generalized weakness, hypoglycemia at home, workup significant for UTI.  AKI on CKD 3,  - secondary to diuresis during last admission, holding admission during hospital stay, was kept on gentle hydration, 18 was 1.28 on discharge, as instructed to resume her home dose Lasix, and to continue with fluid restrictions.  UTI - Treated with Rocephin during hospital stay, urine culture growing Klebsiella pneumonia, to finish in about 4 days on oral Ceftin as an outpatient - Was complaining of right lower quadrant pain, CT scan was obtained, no evidence of renal stone, no acute abdominal finding on CT renal.   diabetes mellitus - Brittle with hypoglycemia prior to admission. - Continue with Lantus 5 units daily, continue with insulin sliding scale,  - Brittle diabetes mellitus, will have to tolerate elevated CBGs  to provide hypoglycemia. - Will need to start following with endocrinology as outpatient.  Recent CVA - Continue baby ASA, plavix, statin  HTN - Continue home dose of Coreg, she may need a prn  Hypothyroidism - Continue with Synthroid  Chronic diastolic CHF - Monitor closely as of diuresis on gentle hydration  Anemia of chronic disease - hemoglobin at baseline (baseline varies widely from 7-9 between admissions), patient reports some some hemorrhoidal bleed on admission, no recurrence.  Discharge Condition:    Follow UP  Follow-up Information    Irving CopasHACKER,ROBERT KELLER, MD Follow up in 1 week(s).   Specialty:  Family Medicine Contact information: [address] [place] [id number] [phone]             Discharge Instructions  and  Discharge Medications    Discharge Instructions    Discharge instructions    Complete by:  As directed   Follow with Primary MD Irving CopasHACKER,ROBERT  KELLER, MD in 7 days   Get CBC, CMP,  checked  by Primary MD next visit.    Activity: As tolerated with Full fall precautions use walker/cane & assistance as needed   Disposition Home    Diet: Heart Healthy , carbohydrate modified with 1500 mL fluid restrictions , with feeding assistance and aspiration precautions.  For Heart failure patients - Check  your Weight same time everyday, if you gain over 2 pounds, or you develop in leg swelling, experience more shortness of breath or chest pain, call your Primary MD immediately. Follow Cardiac Low Salt Diet and 1.5 lit/day fluid restriction.   On your next visit with your primary care physician please Get Medicines reviewed and adjusted.   Please request your Prim.MD to go over all Hospital Tests and Procedure/Radiological results at the follow up, please get all Hospital records sent to your Prim MD by signing hospital release before you go home.   If you experience worsening of your admission symptoms, develop shortness of breath, life threatening emergency, suicidal or homicidal thoughts you must seek medical attention immediately by calling 911 or calling your MD immediately  if symptoms less severe.  You Must read complete instructions/literature along with all the possible adverse reactions/side effects for all the Medicines you take and that have been prescribed to you. Take any new Medicines after you have completely understood and accpet all the possible adverse reactions/side effects.   Do not drive, operating heavy machinery, perform activities at heights, swimming or participation in water activities or provide baby sitting services if your were admitted for syncope or siezures until you have seen by Primary MD or a Neurologist and advised to do so again.  Do not drive when taking Pain medications.    Do not take more than prescribed Pain, Sleep and Anxiety Medications  Special Instructions: If you have smoked or chewed Tobacco   in the last 2 yrs please stop smoking, stop any regular Alcohol  and or any Recreational drug use.  Wear Seat belts while driving.   Please note  You were cared for by a hospitalist during your hospital stay. If you have any questions about your discharge medications or the care you received while you were in the hospital after you are discharged, you can call the unit and asked to speak with the hospitalist on call if the hospitalist that took care of you is not available. Once you are discharged, your primary care physician will handle any further medical issues. Please note that NO REFILLS for any discharge medications will be authorized once you are discharged, as it is imperative that you return to your primary care physician (or establish a relationship with a primary care physician if you do not have one) for your aftercare needs so that they can reassess your need for medications and monitor your lab values.   Increase activity slowly    Complete by:  As directed       Medication List    TAKE these medications   aspirin 81 MG EC tablet Take 1 tablet (81 mg total) by mouth daily.   atorvastatin 10 MG tablet Commonly known as:  LIPITOR Take 1 tablet (10 mg total) by mouth daily at 6 PM. What changed:  when to take this   carvedilol 3.125 MG tablet Commonly known as:  COREG Take 1 tablet (3.125 mg total) by mouth 2 (two) times daily with a meal.   cefUROXime 250 MG tablet Commonly known as:  CEFTIN Take 1 tablet (250 mg total) by mouth 2 (two) times daily with a meal.   cetirizine 10 MG tablet Commonly known as:  ZYRTEC Take 10 mg by mouth daily as needed for allergies.   clopidogrel 75 MG tablet Commonly known as:  PLAVIX Take 1 tablet (75 mg total) by mouth daily.   cyanocobalamin 1000 MCG/ML injection Commonly known as:  (  VITAMIN B-12) Inject 1,000 mcg into the muscle every 30 (thirty) days. Last injection approx 3rd week of June, 2017   escitalopram 10 MG  tablet Commonly known as:  LEXAPRO Take 1 tablet (10 mg total) by mouth at bedtime.   feeding supplement (GLUCERNA SHAKE) Liqd Take 237 mLs by mouth 3 (three) times daily between meals.   folic acid 1 MG tablet Commonly known as:  FOLVITE Take 1 tablet (1 mg total) by mouth daily.   furosemide 20 MG tablet Commonly known as:  LASIX Take 1 tablet (20 mg total) by mouth daily.   hydrocortisone 2.5 % rectal cream Commonly known as:  ANUSOL-HC Place rectally 4 (four) times daily.   insulin aspart 100 UNIT/ML injection Commonly known as:  novoLOG Inject 0-15 Units into the skin 3 (three) times daily with meals.   insulin glargine 100 UNIT/ML injection Commonly known as:  LANTUS Inject 0.05 mLs (5 Units total) into the skin daily.   iron polysaccharides 150 MG capsule Commonly known as:  NIFEREX Take 1 capsule (150 mg total) by mouth 2 (two) times daily. What changed:  when to take this   levothyroxine 75 MCG tablet Commonly known as:  SYNTHROID, LEVOTHROID Take 75 mcg by mouth daily.   LORazepam 0.5 MG tablet Commonly known as:  ATIVAN Take 1 tablet (0.5 mg total) by mouth every 8 (eight) hours as needed for anxiety.   meclizine 25 MG tablet Commonly known as:  ANTIVERT Take 1 tablet (25 mg total) by mouth 3 (three) times daily as needed for dizziness. What changed:  when to take this   pantoprazole 40 MG tablet Commonly known as:  PROTONIX Take 1 tablet (40 mg total) by mouth 2 (two) times daily. What changed:  when to take this  reasons to take this   polyethylene glycol packet Commonly known as:  MIRALAX / GLYCOLAX Take 17 g by mouth every other day.   saccharomyces boulardii 250 MG capsule Commonly known as:  FLORASTOR Take 1 capsule (250 mg total) by mouth 2 (two) times daily.   sodium chloride 5 % ophthalmic solution Commonly known as:  MURO 128 Place 1 drop into both eyes daily.   thiamine 100 MG tablet Take 1 tablet (100 mg total) by mouth  daily.   witch hazel-glycerin pad Commonly known as:  TUCKS Apply topically as needed for irritation or hemorrhoids.         Diet and Activity recommendation: See Discharge Instructions above  Consults obtained  None   Major procedures and Radiology Reports - PLEASE review detailed and final reports for all details, in brief -      Dg Chest 2 View  Result Date: 07/11/2016 CLINICAL DATA:  Bilateral lower extremity swelling for the past 2 days. EXAM: CHEST  2 VIEW COMPARISON:  06/22/2016; 05/10/2016; chest CT - 05/04/2016 FINDINGS: Grossly unchanged borderline enlarged cardiac silhouette. Normal mediastinal contours. Atherosclerotic plaque when the thoracic aorta. The lungs appear hyperexpanded with flattening of the bilateral hemidiaphragms mild diffuse slightly nodular thickening of the pulmonary interstitium. Interval development of a trace right-sided effusion with associated right basilar heterogeneous opacities. No definite evidence of edema. No acute osseus abnormalities. Post lower cervical ACDF, incompletely evaluated. IMPRESSION: 1. Interval development of a trace right-sided effusion with associated right basilar opacities, atelectasis versus infiltrate. A follow-up chest radiograph in 3 to 4 weeks after treatment is recommended to ensure resolution. 2. Cardiomegaly without evidence of edema. 3.  Emphysema. (ICD10-J43.9) 4.  Aortic Atherosclerosis (ICD10-170.0) Electronically  Signed   By: Simonne Come M.D.   On: 07/11/2016 21:23  Ct Head Wo Contrast  Result Date: 07/11/2016 CLINICAL DATA:  Weakness with dizziness and blurred vision. Headache. EXAM: CT HEAD WITHOUT CONTRAST TECHNIQUE: Contiguous axial images were obtained from the base of the skull through the vertex without intravenous contrast. COMPARISON:  06/22/2016. FINDINGS: There is no evidence for acute hemorrhage, hydrocephalus, mass lesion, or abnormal extra-axial fluid collection. No definite CT evidence for acute  infarction. Diffuse loss of parenchymal volume is consistent with atrophy. Patchy low attenuation in the deep hemispheric and periventricular white matter is nonspecific, but likely reflects chronic microvascular ischemic demyelination. The visualized paranasal sinuses and mastoid air cells are clear. IMPRESSION: Stable.  No acute intracranial abnormality. Atrophy with chronic small vessel white matter ischemic disease. Electronically Signed   By: Kennith Center M.D.   On: 07/11/2016 18:00  Ct Head Wo Contrast  Result Date: 06/22/2016 CLINICAL DATA:  70 year old female with acute encephalopathy. EXAM: CT HEAD WITHOUT CONTRAST TECHNIQUE: Contiguous axial images were obtained from the base of the skull through the vertex without intravenous contrast. COMPARISON:  Head CT 05/06/2016. FINDINGS: Patchy and confluent areas of decreased attenuation are noted throughout the deep and periventricular white matter of the cerebral hemispheres bilaterally, compatible with chronic microvascular ischemic disease. No acute intracranial abnormalities. Specifically, no evidence of acute intracranial hemorrhage, no definite findings of acute/subacute cerebral ischemia, no mass, mass effect, hydrocephalus or abnormal intra or extra-axial fluid collections. Visualized paranasal sinuses and mastoids are well pneumatized. No acute displaced skull fractures are identified. IMPRESSION: 1. No acute intracranial abnormalities. 2. Chronic microvascular ischemic changes in cerebral white matter redemonstrated, as above. Electronically Signed   By: Trudie Reed M.D.   On: 06/22/2016 22:30   Mr Brain Wo Contrast  Result Date: 07/12/2016 CLINICAL DATA:  Worsening dizziness. EXAM: MRI HEAD WITHOUT CONTRAST TECHNIQUE: Multiplanar, multiecho pulse sequences of the brain and surrounding structures were obtained without intravenous contrast. COMPARISON:  CT 07/11/2016.  MRI 05/16/2016. FINDINGS: Diffusion imaging shows a punctate acute  infarction in the lateral thalamus on the left. No other acute infarction. Chronic small-vessel ischemic changes affect the pons. No focal cerebellar insult. Old small vessel infarctions affect the thalami and are present throughout the hemispheric deep white matter. Small old right occipital cortical infarction which was acute in June. No mass lesion, hemorrhage, hydrocephalus or extra-axial collection. No pituitary mass. No inflammatory sinus disease. No skull or skullbase lesion. IMPRESSION: New punctate acute infarction in the left lateral thalamus. No other acute infarction. Old ischemic changes as seen previously, including a right occipital cortical infarction that was acute in June of this year. Electronically Signed   By: Paulina Fusi M.D.   On: 07/12/2016 12:05  Dg Chest Portable 1 View  Result Date: 06/22/2016 CLINICAL DATA:  Cough.  History of pneumonia.  Smoker. EXAM: PORTABLE CHEST 1 VIEW COMPARISON:  05/10/2016 chest radiograph. FINDINGS: Surgical hardware from ACDF overlies the lower cervical spine. Stable cardiomediastinal silhouette with normal heart size. No pneumothorax. No pleural effusion. No pulmonary edema. No acute consolidative airspace disease. IMPRESSION: No active disease. Electronically Signed   By: Delbert Phenix M.D.   On: 06/22/2016 15:40   Ct Renal Stone Study  Result Date: 07/21/2016 CLINICAL DATA:  Right flank pain radiating down to right groin. EXAM: CT ABDOMEN AND PELVIS WITHOUT CONTRAST TECHNIQUE: Multidetector CT imaging of the abdomen and pelvis was performed following the standard protocol without IV contrast. COMPARISON:  May 15, 2016 FINDINGS:  Small bilateral pleural effusions are identified. No nodules, masses, or infiltrates seen in the lung bases. No free air. Increased attenuation in the subcutaneous fat diffusely is consistent with volume overload. There is a small amount of fluid adjacent to the inferior tip of the liver, tracking down the right pericolic gutter,  increased since the previous study. There is free fluid in the pelvis. No renal stones, hydronephrosis, or perinephric stranding. A high attenuation mass off the inferior pole of the left kidney is unchanged since May of 2017 and May of 2012, consistent with a hyperdense cyst. No other suspicious masses are identified. No focal perinephric stranding. The patient is status post cholecystectomy. The liver, spleen, adrenal glands, and pancreas are normal. The abdominal aorta is atherosclerotic but non aneurysmal. No adenopathy. The stomach and small bowel are normal. The colon is unremarkable. There is fecal loading in the cecum and ascending colon. No wall thickening or convincing evidence of colitis. The patient is status post appendectomy. Increased attenuation in the mesenteric fat is likely due to volume overload. The pelvis demonstrates no adenopathy or mass. The patient is status post hysterectomy. The bladder wall is mildly prominent, likely due to poor distention. Multiple calcifications in the pelvis or present previously, consistent with phleboliths. No ureteral stones are noted. Visualized bones are unchanged with no acute fractures. Degenerative changes are noted. IMPRESSION: 1. No renal stones or obstruction. 2. Small bilateral effusions, mild ascites, and increased attenuation in both the superficial and mesenteric fat is all consistent with volume overload. 3. No cause for acute right lower quadrant pain. Electronically Signed   By: Gerome Sam III M.D   On: 07/21/2016 12:46    Micro Results    Recent Results (from the past 240 hour(s))  Urine culture     Status: Abnormal   Collection Time: 07/19/16  6:40 PM  Result Value Ref Range Status   Specimen Description URINE, CLEAN CATCH  Final   Special Requests NONE  Final   Culture >=100,000 COLONIES/mL KLEBSIELLA PNEUMONIAE (A)  Final   Report Status 07/21/2016 FINAL  Final   Organism ID, Bacteria KLEBSIELLA PNEUMONIAE (A)  Final       Susceptibility   Klebsiella pneumoniae - MIC*    AMPICILLIN >=32 RESISTANT Resistant     CEFAZOLIN <=4 SENSITIVE Sensitive     CEFTRIAXONE <=1 SENSITIVE Sensitive     CIPROFLOXACIN <=0.25 SENSITIVE Sensitive     GENTAMICIN <=1 SENSITIVE Sensitive     IMIPENEM <=0.25 SENSITIVE Sensitive     NITROFURANTOIN 64 INTERMEDIATE Intermediate     TRIMETH/SULFA >=320 RESISTANT Resistant     AMPICILLIN/SULBACTAM 4 SENSITIVE Sensitive     PIP/TAZO <=4 SENSITIVE Sensitive     Extended ESBL NEGATIVE Sensitive     * >=100,000 COLONIES/mL KLEBSIELLA PNEUMONIAE       Today   Subjective:   Cassandra [name] today has no headache,no chest pain,Report generalized weakness, feels better today, wants to go home, saying I am homesick .  Objective:   Blood pressure (!) 161/70, pulse 67, temperature 99.4 F (37.4 C), temperature source Oral, resp. rate 16, height 5\' 2"  (1.575 m), weight 47.2 kg (104 lb), SpO2 96 %.   Intake/Output Summary (Last 24 hours) at 07/21/16 1522 Last data filed at 07/21/16 1307  Gross per 24 hour  Intake              600 ml  Output             1600 ml  Net            -1000 ml    Exam Awake Alert, Oriented x 3,Frail Supple Neck,No JVD,  Symmetrical Chest wall movement, Good air movement bilaterally,  RRR,No Gallops,Rubs or new Murmurs, No Parasternal Heave +ve B.Sounds, Abd Soft, Non tender. No Cyanosis, Clubbing or edema, No new Rash or bruise  Data Review   CBC w Diff: Lab Results  Component Value Date   WBC 7.6 07/21/2016   HGB 7.5 (L) 07/21/2016   HGB 13.7 08/12/2014   HCT 22.6 (L) 07/21/2016   HCT 40.5 08/12/2014   PLT 209 07/21/2016   PLT 267 08/12/2014   LYMPHOPCT 26 07/19/2016   LYMPHOPCT 25.5 08/12/2014   MONOPCT 5 07/19/2016   MONOPCT 7.4 08/12/2014   EOSPCT 3 07/19/2016   EOSPCT 4.0 08/12/2014   BASOPCT 1 07/19/2016   BASOPCT 1.3 08/12/2014    CMP: Lab Results  Component Value Date   NA 138 07/21/2016   NA 136 06/15/2014   K 4.3  07/21/2016   K 3.3 (L) 06/15/2014   CL 106 07/21/2016   CL 99 06/15/2014   CO2 26 07/21/2016   CO2 25 06/15/2014   BUN 20 07/21/2016   BUN 16 06/15/2014   CREATININE 1.28 (H) 07/21/2016   CREATININE 1.17 06/15/2014   PROT 6.7 07/19/2016   PROT 5.8 (L) 10/06/2013   ALBUMIN 3.5 07/19/2016   ALBUMIN 2.7 (L) 10/06/2013   BILITOT 0.5 07/19/2016   BILITOT 0.2 10/06/2013   ALKPHOS 63 07/19/2016   ALKPHOS 80 10/06/2013   AST 28 07/19/2016   AST 28 10/06/2013   ALT 15 07/19/2016   ALT 19 10/06/2013  .   Total Time in preparing paper work, data evaluation and todays exam - 35 minutes  Maye Parkinson M.D on 07/21/2016 at 3:22 PM  [facility]   Office  [phone]

## 2016-07-21 NOTE — Discharge Instructions (Signed)
Follow with Primary MD Irving CopasHACKER,ROBERT KELLER, MD in 7 days   Get CBC, CMP,  checked  by Primary MD next visit.    Activity: As tolerated with Full fall precautions use walker/cane & assistance as needed   Disposition Home    Diet: Heart Healthy , carbohydrate modified with 1500 mL fluid restrictions , with feeding assistance and aspiration precautions.  For Heart failure patients - Check your Weight same time everyday, if you gain over 2 pounds, or you develop in leg swelling, experience more shortness of breath or chest pain, call your Primary MD immediately. Follow Cardiac Low Salt Diet and 1.5 lit/day fluid restriction.   On your next visit with your primary care physician please Get Medicines reviewed and adjusted.   Please request your Prim.MD to go over all Hospital Tests and Procedure/Radiological results at the follow up, please get all Hospital records sent to your Prim MD by signing hospital release before you go home.   If you experience worsening of your admission symptoms, develop shortness of breath, life threatening emergency, suicidal or homicidal thoughts you must seek medical attention immediately by calling 911 or calling your MD immediately  if symptoms less severe.  You Must read complete instructions/literature along with all the possible adverse reactions/side effects for all the Medicines you take and that have been prescribed to you. Take any new Medicines after you have completely understood and accpet all the possible adverse reactions/side effects.   Do not drive, operating heavy machinery, perform activities at heights, swimming or participation in water activities or provide baby sitting services if your were admitted for syncope or siezures until you have seen by Primary MD or a Neurologist and advised to do so again.  Do not drive when taking Pain medications.    Do not take more than prescribed Pain, Sleep and Anxiety Medications  Special  Instructions: If you have smoked or chewed Tobacco  in the last 2 yrs please stop smoking, stop any regular Alcohol  and or any Recreational drug use.  Wear Seat belts while driving.   Please note  You were cared for by a hospitalist during your hospital stay. If you have any questions about your discharge medications or the care you received while you were in the hospital after you are discharged, you can call the unit and asked to speak with the hospitalist on call if the hospitalist that took care of you is not available. Once you are discharged, your primary care physician will handle any further medical issues. Please note that NO REFILLS for any discharge medications will be authorized once you are discharged, as it is imperative that you return to your primary care physician (or establish a relationship with a primary care physician if you do not have one) for your aftercare needs so that they can reassess your need for medications and monitor your lab values.

## 2016-07-21 NOTE — Progress Notes (Signed)
Discharge instructions gone over with patient and spouse. Home medications gone over. Prescriptions given. Home health [facility]  notified of patient's discharge. Diet, activity, and signs of worsening condition discussed. Reasons to call the doctor discussed. Patient is on a 1500 fluid restriction and is aware. Patient verbalized understanding of instructions.

## 2016-08-26 ENCOUNTER — Encounter (HOSPITAL_COMMUNITY): Payer: Self-pay | Admitting: Emergency Medicine

## 2016-08-26 ENCOUNTER — Inpatient Hospital Stay (HOSPITAL_COMMUNITY)
Admission: EM | Admit: 2016-08-26 | Discharge: 2016-08-30 | DRG: 438 | Disposition: A | Discharge status: Home Health | Payer: Medicare Other | Attending: Internal Medicine | Admitting: Internal Medicine

## 2016-08-26 DIAGNOSIS — Z825 Family history of asthma and other chronic lower respiratory diseases: Secondary | ICD-10-CM

## 2016-08-26 DIAGNOSIS — E43 Unspecified severe protein-calorie malnutrition: Secondary | ICD-10-CM | POA: Diagnosis present

## 2016-08-26 DIAGNOSIS — Z823 Family history of stroke: Secondary | ICD-10-CM

## 2016-08-26 DIAGNOSIS — F319 Bipolar disorder, unspecified: Secondary | ICD-10-CM | POA: Diagnosis present

## 2016-08-26 DIAGNOSIS — E876 Hypokalemia: Secondary | ICD-10-CM | POA: Diagnosis not present

## 2016-08-26 DIAGNOSIS — Z794 Long term (current) use of insulin: Secondary | ICD-10-CM

## 2016-08-26 DIAGNOSIS — E039 Hypothyroidism, unspecified: Secondary | ICD-10-CM | POA: Diagnosis present

## 2016-08-26 DIAGNOSIS — F419 Anxiety disorder, unspecified: Secondary | ICD-10-CM | POA: Diagnosis present

## 2016-08-26 DIAGNOSIS — R42 Dizziness and giddiness: Secondary | ICD-10-CM

## 2016-08-26 DIAGNOSIS — E1011 Type 1 diabetes mellitus with ketoacidosis with coma: Secondary | ICD-10-CM

## 2016-08-26 DIAGNOSIS — E871 Hypo-osmolality and hyponatremia: Secondary | ICD-10-CM | POA: Diagnosis not present

## 2016-08-26 DIAGNOSIS — N179 Acute kidney failure, unspecified: Secondary | ICD-10-CM | POA: Diagnosis present

## 2016-08-26 DIAGNOSIS — E875 Hyperkalemia: Secondary | ICD-10-CM | POA: Diagnosis present

## 2016-08-26 DIAGNOSIS — K859 Acute pancreatitis without necrosis or infection, unspecified: Secondary | ICD-10-CM | POA: Diagnosis present

## 2016-08-26 DIAGNOSIS — E1010 Type 1 diabetes mellitus with ketoacidosis without coma: Secondary | ICD-10-CM

## 2016-08-26 DIAGNOSIS — Z833 Family history of diabetes mellitus: Secondary | ICD-10-CM

## 2016-08-26 DIAGNOSIS — D638 Anemia in other chronic diseases classified elsewhere: Secondary | ICD-10-CM | POA: Diagnosis present

## 2016-08-26 DIAGNOSIS — E10649 Type 1 diabetes mellitus with hypoglycemia without coma: Secondary | ICD-10-CM | POA: Diagnosis present

## 2016-08-26 DIAGNOSIS — Z881 Allergy status to other antibiotic agents status: Secondary | ICD-10-CM

## 2016-08-26 DIAGNOSIS — N183 Chronic kidney disease, stage 3 unspecified: Secondary | ICD-10-CM | POA: Diagnosis present

## 2016-08-26 DIAGNOSIS — Z800 Family history of malignant neoplasm of digestive organs: Secondary | ICD-10-CM

## 2016-08-26 DIAGNOSIS — E0810 Diabetes mellitus due to underlying condition with ketoacidosis without coma: Secondary | ICD-10-CM | POA: Diagnosis present

## 2016-08-26 DIAGNOSIS — K219 Gastro-esophageal reflux disease without esophagitis: Secondary | ICD-10-CM | POA: Diagnosis present

## 2016-08-26 DIAGNOSIS — Z681 Body mass index (BMI) 19 or less, adult: Secondary | ICD-10-CM

## 2016-08-26 DIAGNOSIS — E860 Dehydration: Secondary | ICD-10-CM | POA: Diagnosis present

## 2016-08-26 DIAGNOSIS — H409 Unspecified glaucoma: Secondary | ICD-10-CM | POA: Diagnosis present

## 2016-08-26 DIAGNOSIS — Z885 Allergy status to narcotic agent status: Secondary | ICD-10-CM

## 2016-08-26 DIAGNOSIS — N3010 Interstitial cystitis (chronic) without hematuria: Secondary | ICD-10-CM | POA: Diagnosis present

## 2016-08-26 DIAGNOSIS — E1022 Type 1 diabetes mellitus with diabetic chronic kidney disease: Secondary | ICD-10-CM | POA: Diagnosis present

## 2016-08-26 DIAGNOSIS — F17210 Nicotine dependence, cigarettes, uncomplicated: Secondary | ICD-10-CM | POA: Diagnosis present

## 2016-08-26 DIAGNOSIS — Z79899 Other long term (current) drug therapy: Secondary | ICD-10-CM

## 2016-08-26 DIAGNOSIS — R1110 Vomiting, unspecified: Secondary | ICD-10-CM | POA: Diagnosis present

## 2016-08-26 DIAGNOSIS — I129 Hypertensive chronic kidney disease with stage 1 through stage 4 chronic kidney disease, or unspecified chronic kidney disease: Secondary | ICD-10-CM

## 2016-08-26 DIAGNOSIS — Z8249 Family history of ischemic heart disease and other diseases of the circulatory system: Secondary | ICD-10-CM

## 2016-08-26 DIAGNOSIS — Z8673 Personal history of transient ischemic attack (TIA), and cerebral infarction without residual deficits: Secondary | ICD-10-CM

## 2016-08-26 DIAGNOSIS — I10 Essential (primary) hypertension: Secondary | ICD-10-CM | POA: Diagnosis present

## 2016-08-26 DIAGNOSIS — Z7982 Long term (current) use of aspirin: Secondary | ICD-10-CM

## 2016-08-26 LAB — BLOOD GAS, VENOUS
ACID-BASE DEFICIT: 2.5 mmol/L — AB (ref 0.0–2.0)
Bicarbonate: 22.1 mmol/L (ref 20.0–28.0)
DRAWN BY: 1381
FIO2: 21
O2 SAT: 86.7 %
PATIENT TEMPERATURE: 98.4
pCO2, Ven: 39.8 mmHg — ABNORMAL LOW (ref 44.0–60.0)
pH, Ven: 7.362 (ref 7.250–7.430)
pO2, Ven: 50.9 mmHg — ABNORMAL HIGH (ref 32.0–45.0)

## 2016-08-26 LAB — BASIC METABOLIC PANEL
ANION GAP: 23 — AB (ref 5–15)
ANION GAP: 12 (ref 5–15)
ANION GAP: 8 (ref 5–15)
BUN: 45 mg/dL — AB (ref 6–20)
BUN: 43 mg/dL — ABNORMAL HIGH (ref 6–20)
BUN: 41 mg/dL — ABNORMAL HIGH (ref 6–20)
CALCIUM: 9 mg/dL (ref 8.9–10.3)
CALCIUM: 8.5 mg/dL — AB (ref 8.9–10.3)
CALCIUM: 8.4 mg/dL — AB (ref 8.9–10.3)
CO2: 11 mmol/L — ABNORMAL LOW (ref 22–32)
CO2: 18 mmol/L — ABNORMAL LOW (ref 22–32)
CO2: 22 mmol/L (ref 22–32)
CREATININE: 2.58 mg/dL — AB (ref 0.44–1.00)
Chloride: 94 mmol/L — ABNORMAL LOW (ref 101–111)
Chloride: 105 mmol/L (ref 101–111)
Chloride: 108 mmol/L (ref 101–111)
Creatinine, Ser: 2.31 mg/dL — ABNORMAL HIGH (ref 0.44–1.00)
Creatinine, Ser: 1.99 mg/dL — ABNORMAL HIGH (ref 0.44–1.00)
GFR calc Af Amer: 21 mL/min — ABNORMAL LOW (ref >60)
GFR, EST AFRICAN AMERICAN: 24 mL/min — AB (ref >60)
GFR, EST AFRICAN AMERICAN: 28 mL/min — AB (ref >60)
GFR, EST NON AFRICAN AMERICAN: 18 mL/min — AB (ref >60)
GFR, EST NON AFRICAN AMERICAN: 20 mL/min — AB (ref >60)
GFR, EST NON AFRICAN AMERICAN: 24 mL/min — AB (ref >60)
GLUCOSE: 848 mg/dL — AB (ref 65–99)
GLUCOSE: 413 mg/dL — AB (ref 65–99)
GLUCOSE: 121 mg/dL — AB (ref 65–99)
Potassium: 5.2 mmol/L — ABNORMAL HIGH (ref 3.5–5.1)
Potassium: 3.6 mmol/L (ref 3.5–5.1)
Potassium: 3.2 mmol/L — ABNORMAL LOW (ref 3.5–5.1)
SODIUM: 135 mmol/L (ref 135–145)
Sodium: 128 mmol/L — ABNORMAL LOW (ref 135–145)
Sodium: 138 mmol/L (ref 135–145)

## 2016-08-26 LAB — URINE MICROSCOPIC-ADD ON

## 2016-08-26 LAB — GLUCOSE, CAPILLARY
GLUCOSE-CAPILLARY: 383 mg/dL — AB (ref 65–99)
GLUCOSE-CAPILLARY: 261 mg/dL — AB (ref 65–99)
GLUCOSE-CAPILLARY: 155 mg/dL — AB (ref 65–99)
Glucose-Capillary: 42 mg/dL — CL (ref 65–99)
Glucose-Capillary: 45 mg/dL — ABNORMAL LOW (ref 65–99)
Glucose-Capillary: 91 mg/dL (ref 65–99)

## 2016-08-26 LAB — URINALYSIS, ROUTINE W REFLEX MICROSCOPIC
BILIRUBIN URINE: NEGATIVE
Glucose, UA: >1000 mg/dL — AB
KETONES UR: 40 mg/dL — AB
Leukocytes, UA: NEGATIVE
NITRITE: NEGATIVE
Protein, ur: 100 mg/dL — AB
Specific Gravity, Urine: 1.026 (ref 1.005–1.030)
pH: 5 (ref 5.0–8.0)

## 2016-08-26 LAB — CBG MONITORING, ED
GLUCOSE-CAPILLARY: 579 mg/dL — AB (ref 65–99)
GLUCOSE-CAPILLARY: 484 mg/dL — AB (ref 65–99)

## 2016-08-26 LAB — CBC
HCT: 35 % — ABNORMAL LOW (ref 36.0–46.0)
HEMOGLOBIN: 11 g/dL — AB (ref 12.0–15.0)
MCH: 30.3 pg (ref 26.0–34.0)
MCHC: 31.4 g/dL (ref 30.0–36.0)
MCV: 96.4 fL (ref 78.0–100.0)
PLATELETS: 357 10*3/uL (ref 150–400)
RBC: 3.63 MIL/uL — ABNORMAL LOW (ref 3.87–5.11)
RDW: 13.7 % (ref 11.5–15.5)
WBC: 9.2 10*3/uL (ref 4.0–10.5)

## 2016-08-26 LAB — MRSA PCR SCREENING: MRSA BY PCR: NEGATIVE

## 2016-08-26 LAB — TROPONIN I

## 2016-08-26 MED ORDER — SODIUM CHLORIDE 0.9 % IV SOLN
INTRAVENOUS | Status: AC
  Administered 2016-08-26: 20:00:00 via INTRAVENOUS

## 2016-08-26 MED ORDER — SODIUM CHLORIDE 0.9% FLUSH
3.0000 mL | Freq: Two times a day (BID) | INTRAVENOUS | Status: DC
  Administered 2016-08-26 – 2016-08-29 (×5): 3 mL via INTRAVENOUS

## 2016-08-26 MED ORDER — ONDANSETRON HCL 4 MG PO TABS: 4.0000 mg | ORAL_TABLET | Freq: Four times a day (QID) | ORAL | Status: DC | PRN

## 2016-08-26 MED ORDER — SODIUM CHLORIDE 0.9 % IV SOLN
INTRAVENOUS | Status: DC
  Administered 2016-08-27 – 2016-08-28 (×3): via INTRAVENOUS

## 2016-08-26 MED ORDER — LORAZEPAM 0.5 MG PO TABS
0.5000 mg | ORAL_TABLET | Freq: Three times a day (TID) | ORAL | Status: DC | PRN
  Administered 2016-08-27 – 2016-08-29 (×4): 0.5 mg via ORAL
  Filled 2016-08-26 (×4): qty 1

## 2016-08-26 MED ORDER — METOCLOPRAMIDE HCL 5 MG/ML IJ SOLN
10.0000 mg | Freq: Once | INTRAMUSCULAR | Status: AC
  Administered 2016-08-26: 10 mg via INTRAVENOUS
  Filled 2016-08-26: qty 2

## 2016-08-26 MED ORDER — LEVOTHYROXINE SODIUM 75 MCG PO TABS
75.0000 ug | ORAL_TABLET | Freq: Every day | ORAL | Status: DC
  Administered 2016-08-27 – 2016-08-30 (×4): 75 ug via ORAL
  Filled 2016-08-26 (×4): qty 1

## 2016-08-26 MED ORDER — PANTOPRAZOLE SODIUM 40 MG PO TBEC
40.0000 mg | DELAYED_RELEASE_TABLET | Freq: Two times a day (BID) | ORAL | Status: DC
  Administered 2016-08-26 – 2016-08-30 (×8): 40 mg via ORAL
  Filled 2016-08-26 (×8): qty 1

## 2016-08-26 MED ORDER — INSULIN GLARGINE 100 UNIT/ML ~~LOC~~ SOLN
10.0000 [IU] | Freq: Every day | SUBCUTANEOUS | Status: DC
  Administered 2016-08-27: 10 [IU] via SUBCUTANEOUS
  Filled 2016-08-26: qty 0.1

## 2016-08-26 MED ORDER — HEPARIN SODIUM (PORCINE) 5000 UNIT/ML IJ SOLN
5000.0000 [IU] | Freq: Three times a day (TID) | INTRAMUSCULAR | Status: DC
  Administered 2016-08-26 – 2016-08-30 (×12): 5000 [IU] via SUBCUTANEOUS
  Filled 2016-08-26 (×13): qty 1

## 2016-08-26 MED ORDER — ESCITALOPRAM OXALATE 10 MG PO TABS
10.0000 mg | ORAL_TABLET | Freq: Every day | ORAL | Status: DC
  Administered 2016-08-26 – 2016-08-29 (×4): 10 mg via ORAL
  Filled 2016-08-26 (×5): qty 1

## 2016-08-26 MED ORDER — SODIUM CHLORIDE 0.9 % IV BOLUS (SEPSIS)
1000.0000 mL | Freq: Once | INTRAVENOUS | Status: AC
  Administered 2016-08-26: 1000 mL via INTRAVENOUS

## 2016-08-26 MED ORDER — CARVEDILOL 3.125 MG PO TABS
3.1250 mg | ORAL_TABLET | Freq: Two times a day (BID) | ORAL | Status: DC
  Administered 2016-08-26 – 2016-08-30 (×8): 3.125 mg via ORAL
  Filled 2016-08-26 (×8): qty 1

## 2016-08-26 MED ORDER — POTASSIUM CHLORIDE 10 MEQ/100ML IV SOLN
10.0000 meq | INTRAVENOUS | Status: AC
  Administered 2016-08-26 – 2016-08-27 (×2): 10 meq via INTRAVENOUS
  Filled 2016-08-26 (×2): qty 100

## 2016-08-26 MED ORDER — DEXTROSE-NACL 5-0.45 % IV SOLN
INTRAVENOUS | Status: DC
  Administered 2016-08-26: 22:00:00 via INTRAVENOUS

## 2016-08-26 MED ORDER — ONDANSETRON HCL 4 MG/2ML IJ SOLN: 4.0000 mg | Freq: Four times a day (QID) | INTRAMUSCULAR | Status: DC | PRN

## 2016-08-26 MED ORDER — INSULIN REGULAR HUMAN 100 UNIT/ML IJ SOLN
INTRAMUSCULAR | Status: DC
  Filled 2016-08-26: qty 2.5

## 2016-08-26 MED ORDER — INSULIN REGULAR HUMAN 100 UNIT/ML IJ SOLN
INTRAMUSCULAR | Status: DC
  Administered 2016-08-26: 5.2 [IU]/h via INTRAVENOUS
  Filled 2016-08-26: qty 2.5

## 2016-08-26 MED ORDER — METOCLOPRAMIDE HCL 5 MG/ML IJ SOLN
10.0000 mg | Freq: Four times a day (QID) | INTRAMUSCULAR | Status: DC | PRN
  Administered 2016-08-28: 10 mg via INTRAVENOUS
  Filled 2016-08-26: qty 2

## 2016-08-26 MED ORDER — INSULIN ASPART 100 UNIT/ML ~~LOC~~ SOLN
0.0000 [IU] | Freq: Three times a day (TID) | SUBCUTANEOUS | Status: DC
  Administered 2016-08-27: 2 [IU] via SUBCUTANEOUS
  Administered 2016-08-28: 1 [IU] via SUBCUTANEOUS
  Administered 2016-08-28: 2 [IU] via SUBCUTANEOUS
  Administered 2016-08-29: 7 [IU] via SUBCUTANEOUS
  Administered 2016-08-29: 5 [IU] via SUBCUTANEOUS
  Administered 2016-08-30: 3 [IU] via SUBCUTANEOUS

## 2016-08-26 MED ORDER — ACETAMINOPHEN 650 MG RE SUPP: 650.0000 mg | Freq: Four times a day (QID) | RECTAL | Status: DC | PRN

## 2016-08-26 MED ORDER — ACETAMINOPHEN 325 MG PO TABS
650.0000 mg | ORAL_TABLET | Freq: Four times a day (QID) | ORAL | Status: DC | PRN
  Administered 2016-08-28: 650 mg via ORAL
  Filled 2016-08-26: qty 2

## 2016-08-26 NOTE — H&P (Signed)
History and Physical    Cassandra [name]:[id number] DOB: July 30, 1946 DOA: 08/26/2016    PCP: Irving Copas, MD  Patient coming from: home  Chief Complaint: hyperglycemia, vomiting  HPI: Cassandra [name] is a 70 y.o. female with medical history significant of DM1, HTN, CKD3, hypothyroidism who has been vomiting for the past 3 days. She had a normal blood glucose yesterday but today she was having high readings on her glucometer and therefore presented to the hospital. She is found to have DKA and is therefore being admitted. She has not noted any blood in her vomitus. She has mild diffuse abdominal pain. Has not had diarrhea. Has not had fevers or chills.   ED Course: sodium 128, K 5.2, Cl 94, CO2 11, BUN 45, Cr 2.58, Glucose 848, Hb 11  Review of Systems:  All other systems reviewed and apart from HPI, are negative.  Past Medical History:  Diagnosis Date  . Allergic rhinitis   . Anxiety   . Benign hypertension with CKD (chronic kidney disease) stage III   . Benign paroxysmal positional vertigo   . Bipolar disorder (HCC)   . Broken finger   . Broken shoulder   . Broken toes   . Cervicalgia   . CKD (chronic kidney disease) stage 3, GFR 30-59 ml/min   . Depression   . Diabetes mellitus without complication (HCC)   . Elevated liver enzymes Hep B/C neg 2014  . GERD (gastroesophageal reflux disease)   . Glaucoma   . History of alcohol use   . Hypertension   . Hypothyroidism   . Interstitial cystitis    bladder stretched every 9 months  . Migraines   . Psoriasis   . Stroke (HCC) 05/16/2016   Left occipital and thalamic, right hippocampal  . Thyroid disease   . Tobacco use   . 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 Vitamin B12 deficiency     Past Surgical History:  Procedure Laterality Date  . ABDOMINAL HYSTERECTOMY     with oophorectomy  . APPENDECTOMY    . BLADDER SURGERY    . CATARACT EXTRACTION    . CERVICAL DISC SURGERY    .  CHOLECYSTECTOMY    . HERNIA REPAIR    . TONSILLECTOMY      Social History:   reports that she has been smoking Cigarettes.  She has been smoking about 0.25 packs per day. She has never used smokeless tobacco. She reports that she does not drink alcohol or use drugs.  Allergies  Allergen Reactions  . Ciprofloxacin Hives  . Hydrocodone Nausea And Vomiting  . Codeine Diarrhea and Nausea And Vomiting  . Doxycycline Diarrhea and Nausea And Vomiting  . Omnicef [Cefdinir] Nausea Only and Other (See Comments)    Constipation, tolerated Zosyn    Family History  Problem Relation Age of Onset  . Alcohol abuse Mother   . Arthritis Mother   . Asthma Mother   . Cancer Mother     colon cancer  . Hypertension Mother   . Migraines Mother   . Stroke Mother   . Lung disease Mother   . COPD Mother   . Diabetes Father   . Hypertension Father   . Heart disease Father   . Heart attack Father   . Heart disease Paternal Grandmother   . Diabetes Paternal Grandmother   . Stroke Paternal Grandmother   . Cancer Paternal Grandmother   . Diabetes Paternal Grandfather  Prior to Admission medications   Medication Sig Start Date End Date Taking? Authorizing Provider  aspirin EC 81 MG EC tablet Take 1 tablet (81 mg total) by mouth daily. 05/19/16  Yes Rolly Salter, MD  atorvastatin (LIPITOR) 10 MG tablet Take 1 tablet (10 mg total) by mouth daily at 6 PM. Patient taking differently: Take 10 mg by mouth at bedtime.  05/19/16  Yes Rolly Salter, MD  carvedilol (COREG) 3.125 MG tablet Take 1 tablet (3.125 mg total) by mouth 2 (two) times daily with a meal. 07/18/16  Yes Mauricio Annett Gula, MD  cefUROXime (CEFTIN) 250 MG tablet Take 1 tablet (250 mg total) by mouth 2 (two) times daily with a meal. Patient not taking: Reported on 08/26/2016 07/21/16   Leana Roe Elgergawy, MD  cetirizine (ZYRTEC) 10 MG tablet Take 10 mg by mouth daily as needed for allergies.    Historical Provider, MD  cyanocobalamin  (,VITAMIN B-12,) 1000 MCG/ML injection Inject 1,000 mcg into the muscle every 30 (thirty) days. Last injection approx 3rd week of June, 2017    Historical Provider, MD  escitalopram (LEXAPRO) 10 MG tablet Take 1 tablet (10 mg total) by mouth at bedtime. 05/19/16   Rolly Salter, MD  feeding supplement, GLUCERNA SHAKE, (GLUCERNA SHAKE) LIQD Take 237 mLs by mouth 3 (three) times daily between meals. Patient not taking: Reported on 06/22/2016 05/19/16   Rolly Salter, MD  folic acid (FOLVITE) 1 MG tablet Take 1 tablet (1 mg total) by mouth daily. Patient not taking: Reported on 07/19/2016 05/19/16   Rolly Salter, MD  furosemide (LASIX) 20 MG tablet Take 1 tablet (20 mg total) by mouth daily. 05/19/16   Rolly Salter, MD  hydrocortisone (ANUSOL-HC) 2.5 % rectal cream Place rectally 4 (four) times daily. 05/19/16   Rolly Salter, MD  insulin aspart (NOVOLOG) 100 UNIT/ML injection Inject 0-15 Units into the skin 3 (three) times daily with meals. 07/18/16   Mauricio Annett Gula, MD  insulin glargine (LANTUS) 100 UNIT/ML injection Inject 0.05 mLs (5 Units total) into the skin daily. 07/21/16   Leana Roe Elgergawy, MD  iron polysaccharides (NIFEREX) 150 MG capsule Take 1 capsule (150 mg total) by mouth 2 (two) times daily. Patient taking differently: Take 150 mg by mouth daily.  06/28/16   Vassie Loll, MD  levothyroxine (SYNTHROID, LEVOTHROID) 75 MCG tablet Take 75 mcg by mouth daily. 05/30/16   Historical Provider, MD  LORazepam (ATIVAN) 0.5 MG tablet Take 1 tablet (0.5 mg total) by mouth every 8 (eight) hours as needed for anxiety. 05/19/16   Rolly Salter, MD  meclizine (ANTIVERT) 25 MG tablet Take 1 tablet (25 mg total) by mouth 3 (three) times daily as needed for dizziness. Patient taking differently: Take 25 mg by mouth 3 (three) times daily.  02/12/16   Chilton Si, MD  pantoprazole (PROTONIX) 40 MG tablet Take 1 tablet (40 mg total) by mouth 2 (two) times daily. Patient taking differently: Take 40 mg by  mouth 2 (two) times daily as needed (acid reflux).  05/19/16   Rolly Salter, MD  polyethylene glycol (MIRALAX / Ethelene Hal) packet Take 17 g by mouth every other day. 05/19/16   Rolly Salter, MD  saccharomyces boulardii (FLORASTOR) 250 MG capsule Take 1 capsule (250 mg total) by mouth 2 (two) times daily. 05/19/16   Rolly Salter, MD  sodium chloride (MURO 128) 5 % ophthalmic solution Place 1 drop into both eyes daily.  Historical Provider, MD  thiamine 100 MG tablet Take 1 tablet (100 mg total) by mouth daily. Patient not taking: Reported on 07/19/2016 05/19/16   Rolly Salter, MD  witch hazel-glycerin (TUCKS) pad Apply topically as needed for irritation or hemorrhoids. 07/21/16   Starleen Arms, MD    Physical Exam: Vitals:   08/26/16 1408 08/26/16 1421 08/26/16 1528  BP:  163/69 151/69  Pulse:  90 78  Resp:  18   Temp:  97.9 F (36.6 C)   TempSrc:  Oral   SpO2: 100% 100% 98%      Constitutional: NAD, calm, comfortable Eyes: PERTLA, lids and conjunctivae normal ENMT: Mucous membranes are moist. Posterior pharynx clear of any exudate or lesions. Normal dentition.  Neck: normal, supple, no masses, no thyromegaly Respiratory: clear to auscultation bilaterally, no wheezing, no crackles. Normal respiratory effort. No accessory muscle use.  Cardiovascular: S1 & S2 heard, regular rate and rhythm, no murmurs / rubs / gallops. No extremity edema. 2+ pedal pulses. No carotid bruits.  Abdomen: No distension, mild diffuse tenderness, no masses palpated. No hepatosplenomegaly. Bowel sounds normal.  Musculoskeletal: no clubbing / cyanosis. No joint deformity upper and lower extremities. Good ROM, no contractures. Normal muscle tone.  Skin: no rashes, lesions, ulcers. No induration Neurologic: CN 2-12 grossly intact. Sensation intact, DTR normal. Strength 5/5 in all 4 limbs.  Psychiatric: Normal judgment and insight. Alert and oriented x 3. Normal mood.     Labs on Admission: I have personally  reviewed following labs and imaging studies  CBC:  Recent Labs Lab 08/26/16 1510  WBC 9.2  HGB 11.0*  HCT 35.0*  MCV 96.4  PLT 357   Basic Metabolic Panel:  Recent Labs Lab 08/26/16 1510  NA 128*  K 5.2*  CL 94*  CO2 11*  GLUCOSE 848*  BUN 45*  CREATININE 2.58*  CALCIUM 9.0   GFR: CrCl cannot be calculated (Unknown ideal weight.). Liver Function Tests: No results for input(s): AST, ALT, ALKPHOS, BILITOT, PROT, ALBUMIN in the last 168 hours. No results for input(s): LIPASE, AMYLASE in the last 168 hours. No results for input(s): AMMONIA in the last 168 hours. Coagulation Profile: No results for input(s): INR, PROTIME in the last 168 hours. Cardiac Enzymes:  Recent Labs Lab 08/26/16 1602  TROPONINI <0.03   BNP (last 3 results) No results for input(s): PROBNP in the last 8760 hours. HbA1C: No results for input(s): HGBA1C in the last 72 hours. CBG:  Recent Labs Lab 08/26/16 1420 08/26/16 1725  GLUCAP >600* 579*   Lipid Profile: No results for input(s): CHOL, HDL, LDLCALC, TRIG, CHOLHDL, LDLDIRECT in the last 72 hours. Thyroid Function Tests: No results for input(s): TSH, T4TOTAL, FREET4, T3FREE, THYROIDAB in the last 72 hours. Anemia Panel: No results for input(s): VITAMINB12, FOLATE, FERRITIN, TIBC, IRON, RETICCTPCT in the last 72 hours. Urine analysis:    Component Value Date/Time   COLORURINE YELLOW 08/26/2016 1530   APPEARANCEUR CLOUDY (A) 08/26/2016 1530   APPEARANCEUR Clear 06/15/2014 1508   LABSPEC 1.026 08/26/2016 1530   LABSPEC 1.010 06/15/2014 1508   PHURINE 5.0 08/26/2016 1530   GLUCOSEU >1000 (A) 08/26/2016 1530   GLUCOSEU Negative 06/15/2014 1508   HGBUR TRACE (A) 08/26/2016 1530   BILIRUBINUR NEGATIVE 08/26/2016 1530   BILIRUBINUR Negative 06/15/2014 1508   KETONESUR 40 (A) 08/26/2016 1530   PROTEINUR 100 (A) 08/26/2016 1530   UROBILINOGEN 0.2 09/14/2015 1617   NITRITE NEGATIVE 08/26/2016 1530   LEUKOCYTESUR NEGATIVE 08/26/2016  1530  LEUKOCYTESUR Negative 06/15/2014 1508   Sepsis Labs: @LABRCNTIP (procalcitonin:4,lacticidven:4) )No results found for this or any previous visit (from the past 240 hour(s)).   Radiological Exams on Admission: No results found.  EKG: Independently reviewed. Sinus rhythm  Assessment/Plan Principal Problem:   DKA (diabetic ketoacidoses) - cont insulin infusion which had been started in the ER - IVF - close f/u on Bmet  Hyponatremia/ hyperkalemia - electrolyte abnormalities should correct with further hydration    Hypertension - resume Coreg    AKI on CKD (chronic kidney disease) stage 3, GFR 30-59 ml/min - cont IVF as this is likley prerena  Vomiting - viral? Due to DKA?  - NPO except sips with meds - Zofran PRN    Hypothyroidism - synthroid    DVT prophylaxis: Lovenox Code Status: Full code  Family Communication: husband  Disposition Plan: 2-3 day hospital stay expected Consults called: none Admission status: admit     [facility] LPRIZWAN,Kalissa Grays MD [facility] Pager: [email or web address] Password [email or web address] 7PM-7AM, please contact night-coverage   08/26/2016, 6:10 PM

## 2016-08-26 NOTE — ED Provider Notes (Signed)
Complains of generalized weakness, felt as if she had a sinus infection with subjective fever onset yesterday. Other associated symptoms include wheezing. She's vomited 3 times since onset of illness. No treatment prior to coming here. On exam alert nontoxic HEENT exam no facial asymmetry (memories dry neck supple lungs clear auscultation abdomen nondistended nontender extremities without edema   Doug SouSam Tawney Vanorman, MD 08/26/16 1727

## 2016-08-26 NOTE — ED Provider Notes (Signed)
[facility]-EMERGENCY DEPT Provider Note   CSN: [id number] Arrival date & time: 08/26/16  1357     History   Chief Complaint Chief Complaint  Patient presents with  . Hyperglycemia    HPI Cassandra [name] is a 70 y.o. female.  HPI   Patient is a 70 year old female with a history of type 1 diabetes since age 70, HTN, CKD stage III who presents to emergency department with hyperglycemia since this morning. Patient states last 2 days her blood sugar was low in the morning. When she woke this morning her blood sugar was over 600. Patient took 5 units of Lantus and 13 units of Humalog this morning and her blood sugar did not come down. Her normal regimen is 5 units of each in the morning. She has had 2 episodes of emesis with abdominal pain upon awakening. She states her abdominal pain has resolved. Patient states 2 weeks of symptoms of a sinus infection. She's had pain above her eyes with dizziness, subjective fevers, and rhinorrhea. Patient has a history of vertigo and took an Antivert this morning without relief. Patient has also tried Excedrin Migraine, over-the-counter sinus medication, and cetrizine without relief. Patient denies chest pain, shortness of breath, visual changes, hematemesis, hematochezia, diarrhea.  Past Medical History:  Diagnosis Date  . Allergic rhinitis   . Anxiety   . Benign hypertension with CKD (chronic kidney disease) stage III   . Benign paroxysmal positional vertigo   . Bipolar disorder (HCC)   . Broken finger   . Broken shoulder   . Broken toes   . Cervicalgia   . CKD (chronic kidney disease) stage 3, GFR 30-59 ml/min   . Depression   . Diabetes mellitus without complication (HCC)   . Elevated liver enzymes Hep B/C neg 2014  . GERD (gastroesophageal reflux disease)   . Glaucoma   . History of alcohol use   . Hypertension   . Hypothyroidism   . Interstitial cystitis    bladder stretched every 9 months  . Migraines   . Psoriasis   . Stroke (HCC)  05/16/2016   Left occipital and thalamic, right hippocampal  . Thyroid disease   . Tobacco use   . 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 Vitamin B12 deficiency     Patient Active Problem List   Diagnosis Date Noted  . UTI (lower urinary tract infection) 07/19/2016  . AKI (acute kidney injury) (HCC) 07/19/2016  . Chronic diastolic heart failure (HCC) 07/19/2016  . Dehydration   . Cerebral embolism with cerebral infarction 07/13/2016  . Acute diastolic CHF (congestive heart failure) (HCC) 07/11/2016  . Cerebrovascular accident (CVA) (HCC)   . Essential hypertension   . HLD (hyperlipidemia)   . Anemia of chronic disease   . Physical deconditioning   . Acute renal failure superimposed on stage 3 chronic kidney disease (HCC)   . Diabetic ketoacidosis with coma associated with type 1 diabetes mellitus (HCC)   . History of ETT   . Stroke (HCC) 05/16/2016  . Cerebral thrombosis with cerebral infarction 05/15/2016  . Acute on chronic renal failure (HCC)   . Generalized anxiety disorder 05/10/2016  . DKA (diabetic ketoacidoses) (HCC) 05/06/2016  . Acute respiratory failure (HCC) 05/06/2016  . Acute respiratory failure with hypoxia (HCC) 05/06/2016  . Acute encephalopathy 05/06/2016  . Septic shock (HCC)   . Acute respiratory failure with hypoxia and hypercapnia (HCC)   . 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  Depression   . Hypertension   . CKD (chronic kidney disease) stage 3, GFR 30-59 ml/min   . Hypothyroidism   . Vitamin B12 deficiency   . Benign paroxysmal positional vertigo   . Anxiety   . Allergic rhinitis   . Glaucoma   . Benign hypertension with CKD (chronic kidney disease) stage III   . Tobacco use   . Cervicalgia   . Elevated liver enzymes   . History of alcohol use     Past Surgical History:  Procedure Laterality Date  . ABDOMINAL HYSTERECTOMY     with oophorectomy  . APPENDECTOMY    . BLADDER SURGERY    . CATARACT  EXTRACTION    . CERVICAL DISC SURGERY    . CHOLECYSTECTOMY    . HERNIA REPAIR    . TONSILLECTOMY      OB History    No data available       Home Medications    Prior to Admission medications   Medication Sig Start Date End Date Taking? Authorizing Provider  aspirin EC 81 MG EC tablet Take 1 tablet (81 mg total) by mouth daily. 05/19/16  Yes Rolly Salter, MD  atorvastatin (LIPITOR) 10 MG tablet Take 1 tablet (10 mg total) by mouth daily at 6 PM. Patient taking differently: Take 10 mg by mouth at bedtime.  05/19/16  Yes Rolly Salter, MD  carvedilol (COREG) 3.125 MG tablet Take 1 tablet (3.125 mg total) by mouth 2 (two) times daily with a meal. 07/18/16  Yes Mauricio Annett Gula, MD  escitalopram (LEXAPRO) 10 MG tablet Take 1 tablet (10 mg total) by mouth at bedtime. 05/19/16  Yes Rolly Salter, MD  furosemide (LASIX) 20 MG tablet Take 1 tablet (20 mg total) by mouth daily. 05/19/16  Yes Rolly Salter, MD  insulin aspart (NOVOLOG) 100 UNIT/ML injection Inject 0-15 Units into the skin 3 (three) times daily with meals. Patient taking differently: Inject 0-15 Units into the skin 3 (three) times daily as needed for high blood sugar.  07/18/16  Yes Mauricio Annett Gula, MD  cefUROXime (CEFTIN) 250 MG tablet Take 1 tablet (250 mg total) by mouth 2 (two) times daily with a meal. Patient not taking: Reported on 08/26/2016 07/21/16   Leana Roe Elgergawy, MD  cetirizine (ZYRTEC) 10 MG tablet Take 10 mg by mouth daily as needed for allergies.    Historical Provider, MD  cyanocobalamin (,VITAMIN B-12,) 1000 MCG/ML injection Inject 1,000 mcg into the muscle every 30 (thirty) days. Last injection approx 3rd week of June, 2017    Historical Provider, MD  feeding supplement, GLUCERNA SHAKE, (GLUCERNA SHAKE) LIQD Take 237 mLs by mouth 3 (three) times daily between meals. Patient not taking: Reported on 06/22/2016 05/19/16   Rolly Salter, MD  folic acid (FOLVITE) 1 MG tablet Take 1 tablet (1 mg total) by mouth  daily. Patient not taking: Reported on 07/19/2016 05/19/16   Rolly Salter, MD  hydrocortisone (ANUSOL-HC) 2.5 % rectal cream Place rectally 4 (four) times daily. 05/19/16   Rolly Salter, MD  insulin glargine (LANTUS) 100 UNIT/ML injection Inject 0.05 mLs (5 Units total) into the skin daily. 07/21/16   Leana Roe Elgergawy, MD  iron polysaccharides (NIFEREX) 150 MG capsule Take 1 capsule (150 mg total) by mouth 2 (two) times daily. Patient taking differently: Take 150 mg by mouth daily.  06/28/16   Vassie Loll, MD  levothyroxine (SYNTHROID, LEVOTHROID) 75 MCG tablet Take 75 mcg by mouth daily. 05/30/16  Historical Provider, MD  LORazepam (ATIVAN) 0.5 MG tablet Take 1 tablet (0.5 mg total) by mouth every 8 (eight) hours as needed for anxiety. 05/19/16   Rolly Salter, MD  meclizine (ANTIVERT) 25 MG tablet Take 1 tablet (25 mg total) by mouth 3 (three) times daily as needed for dizziness. Patient taking differently: Take 25 mg by mouth 3 (three) times daily.  02/12/16   Chilton Si, MD  pantoprazole (PROTONIX) 40 MG tablet Take 1 tablet (40 mg total) by mouth 2 (two) times daily. Patient taking differently: Take 40 mg by mouth 2 (two) times daily as needed (acid reflux).  05/19/16   Rolly Salter, MD  polyethylene glycol (MIRALAX / Ethelene Hal) packet Take 17 g by mouth every other day. 05/19/16   Rolly Salter, MD  saccharomyces boulardii (FLORASTOR) 250 MG capsule Take 1 capsule (250 mg total) by mouth 2 (two) times daily. 05/19/16   Rolly Salter, MD  sodium chloride (MURO 128) 5 % ophthalmic solution Place 1 drop into both eyes daily.    Historical Provider, MD  thiamine 100 MG tablet Take 1 tablet (100 mg total) by mouth daily. Patient not taking: Reported on 07/19/2016 05/19/16   Rolly Salter, MD  witch hazel-glycerin (TUCKS) pad Apply topically as needed for irritation or hemorrhoids. 07/21/16   Starleen Arms, MD    Family History Family History  Problem Relation Age of Onset  . Alcohol abuse  Mother   . Arthritis Mother   . Asthma Mother   . Cancer Mother     colon cancer  . Hypertension Mother   . Migraines Mother   . Stroke Mother   . Lung disease Mother   . COPD Mother   . Diabetes Father   . Hypertension Father   . Heart disease Father   . Heart attack Father   . Heart disease Paternal Grandmother   . Diabetes Paternal Grandmother   . Stroke Paternal Grandmother   . Cancer Paternal Grandmother   . Diabetes Paternal Grandfather     Social History Social History  Substance Use Topics  . Smoking status: Current Every Day Smoker    Packs/day: 0.25    Types: Cigarettes  . Smokeless tobacco: Never Used  . Alcohol use No     Comment: Hasn't had any alcohol since last admission     Allergies   Ciprofloxacin; Hydrocodone; Codeine; Doxycycline; and Omnicef [cefdinir]   Review of Systems Review of Systems  Constitutional: Positive for fever.  HENT: Positive for rhinorrhea and sinus pressure. Negative for congestion, ear pain, sore throat and trouble swallowing.   Eyes: Negative for visual disturbance.  Respiratory: Negative for cough, chest tightness and shortness of breath.   Cardiovascular: Negative for chest pain.  Gastrointestinal: Positive for abdominal pain and vomiting. Negative for abdominal distention, blood in stool, diarrhea and nausea.  Genitourinary: Negative for dysuria and hematuria.  Musculoskeletal: Negative for neck pain.  Skin: Negative for rash.  Neurological: Positive for dizziness. Negative for syncope, weakness and numbness.     Physical Exam Updated Vital Signs BP 151/69 (BP Location: Left Arm)   Pulse 78   Temp 97.9 F (36.6 C) (Oral)   Resp 18   SpO2 98%   Physical Exam  Constitutional: She appears well-developed and well-nourished. No distress.  HENT:  Head: Normocephalic and atraumatic.  Nose: Nose normal.  Mouth/Throat: Uvula is midline. Mucous membranes are dry. No trismus in the jaw. No uvula swelling. No  oropharyngeal exudate,  posterior oropharyngeal edema, posterior oropharyngeal erythema or tonsillar abscesses.  Eyes: Conjunctivae are normal. Pupils are equal, round, and reactive to light.  Neck: Normal range of motion.  Cardiovascular: Normal rate, regular rhythm and normal heart sounds.  Exam reveals no gallop and no friction rub.   No murmur heard. Pulmonary/Chest: Effort normal and breath sounds normal. No respiratory distress. She has no wheezes. She has no rales.  Abdominal: Soft. Bowel sounds are normal. There is generalized tenderness. There is no rigidity, no rebound, no guarding and no CVA tenderness.  Generalized abdominal tenderness worse in the right upper quadrant  Musculoskeletal: Normal range of motion.  Neurological: She is alert. Coordination normal.  Skin: Skin is warm and dry. No rash noted. She is not diaphoretic.  Psychiatric: She has a normal mood and affect. Her behavior is normal.  Nursing note and vitals reviewed.    ED Treatments / Results  Labs (all labs ordered are listed, but only abnormal results are displayed) Labs Reviewed  BASIC METABOLIC PANEL - Abnormal; Notable for the following:       Result Value   Sodium 128 (*)    Potassium 5.2 (*)    Chloride 94 (*)    CO2 11 (*)    Glucose, Bld 848 (*)    BUN 45 (*)    Creatinine, Ser 2.58 (*)    GFR calc non Af Amer 18 (*)    GFR calc Af Amer 21 (*)    Anion gap 23 (*)    All other components within normal limits  CBC - Abnormal; Notable for the following:    RBC 3.63 (*)    Hemoglobin 11.0 (*)    HCT 35.0 (*)    All other components within normal limits  URINALYSIS, ROUTINE W REFLEX MICROSCOPIC (NOT AT [facility]) - Abnormal; Notable for the following:    APPearance CLOUDY (*)    Glucose, UA >1000 (*)    Hgb urine dipstick TRACE (*)    Ketones, ur 40 (*)    Protein, ur 100 (*)    All other components within normal limits  URINE MICROSCOPIC-ADD ON - Abnormal; Notable for the following:    Squamous  Epithelial / LPF 6-30 (*)    Bacteria, UA FEW (*)    All other components within normal limits  CBG MONITORING, ED - Abnormal; Notable for the following:    Glucose-Capillary >600 (*)    All other components within normal limits  TROPONIN I  BLOOD GAS, VENOUS    EKG  EKG Interpretation  Date/Time:  Monday August 26 2016 16:01:55 EDT Ventricular Rate:  84 PR Interval:    QRS Duration: 83 QT Interval:  377 QTC Calculation: 446 R Axis:   60 Text Interpretation:  Sinus rhythm Probable left atrial enlargement Anteroseptal infarct, old Minimal ST depression, diffuse leads No significant change since last tracing Confirmed by Ethelda Chick  MD, SAM [phone]) on 08/26/2016 4:28:26 PM       Radiology No results found.  Procedures Procedures (including critical care time)  Medications Ordered in ED Medications  insulin regular (NOVOLIN R,HUMULIN R) 250 Units in sodium chloride 0.9 % 250 mL (1 Units/mL) infusion (not administered)  sodium chloride 0.9 % bolus 1,000 mL (1,000 mLs Intravenous New Bag/Given 08/26/16 1647)  metoCLOPramide (REGLAN) injection 10 mg (10 mg Intravenous Given 08/26/16 1647)     Initial Impression / Assessment and Plan / ED Course  I have reviewed the triage vital signs and the nursing notes.  Pertinent  labs & imaging results that were available during my care of the patient were reviewed by me and considered in my medical decision making (see chart for details).  Clinical Course   CRITICAL CARE Performed by: Jerre Simon   Total critical care time: 40 minutes  Critical care time was exclusive of separately billable procedures and treating other patients.  Critical care was necessary to treat or prevent imminent or life-threatening deterioration.  Critical care was time spent personally by me on the following activities: development of treatment plan with patient and/or surrogate as well as nursing, discussions with consultants, evaluation of  patient's response to treatment, examination of patient, obtaining history from patient or surrogate, ordering and performing treatments and interventions, ordering and review of laboratory studies, ordering and review of radiographic studies, pulse oximetry and re-evaluation of patient's condition.   4:35pm on reexam patient is still resting, discussed admission and answered all questions, abdomen still tender, IV fluids have not been administered yet.   Patient presents in DKA. Patient complaining of sinus congestion and pain. Patient has abdominal tenderness on exam. Patient with normal WBC, patient denies urinary symptoms. Unsure at this point the etiology of her DKA. We'll admit to hospitalist.  [facility] hospitalist team and spoke with Dr. Butler Denmark who will admit the patient to stepdown unit. Thank you Dr. Butler Denmark for your time, consult and care of this pt.  Patient case discussed and patient seen by Dr. Ethelda Chick who agrees with the above plan.  Final Clinical Impressions(s) / ED Diagnoses   Final diagnoses:  Diabetic ketoacidosis without coma associated with type 1 diabetes mellitus [facility])    New Prescriptions New Prescriptions   No medications on file     Jerre Simon, [place] 08/26/16 1712    Doug Sou, MD 08/26/16 1727

## 2016-08-26 NOTE — ED Notes (Signed)
I attempted twice to collect labs and was unsuccessful 

## 2016-08-26 NOTE — ED Triage Notes (Signed)
Per [facility] pt from home, Hx type ! Diabetes, co hyperglycemia reading high on meter. Pt sts took total  20 Units of insulin 15 of Novolog / 5 Humalog . denies abd pain nor emesis. Co weakness. Alert and oriented x x4.

## 2016-08-27 LAB — BASIC METABOLIC PANEL
Anion gap: 6 (ref 5–15)
BUN: 36 mg/dL — AB (ref 6–20)
CALCIUM: 8.2 mg/dL — AB (ref 8.9–10.3)
CHLORIDE: 107 mmol/L (ref 101–111)
CO2: 23 mmol/L (ref 22–32)
CREATININE: 1.79 mg/dL — AB (ref 0.44–1.00)
GFR calc non Af Amer: 28 mL/min — ABNORMAL LOW (ref >60)
GFR, EST AFRICAN AMERICAN: 32 mL/min — AB (ref >60)
GLUCOSE: 56 mg/dL — AB (ref 65–99)
Potassium: 3.8 mmol/L (ref 3.5–5.1)
Sodium: 136 mmol/L (ref 135–145)

## 2016-08-27 LAB — LIPASE, BLOOD: LIPASE: 199 U/L — AB (ref 11–51)

## 2016-08-27 LAB — GLUCOSE, CAPILLARY
GLUCOSE-CAPILLARY: 101 mg/dL — AB (ref 65–99)
GLUCOSE-CAPILLARY: 101 mg/dL — AB (ref 65–99)
GLUCOSE-CAPILLARY: 105 mg/dL — AB (ref 65–99)
GLUCOSE-CAPILLARY: 59 mg/dL — AB (ref 65–99)
GLUCOSE-CAPILLARY: 59 mg/dL — AB (ref 65–99)
GLUCOSE-CAPILLARY: 92 mg/dL (ref 65–99)
Glucose-Capillary: 36 mg/dL — CL (ref 65–99)
Glucose-Capillary: 152 mg/dL — ABNORMAL HIGH (ref 65–99)
Glucose-Capillary: 157 mg/dL — ABNORMAL HIGH (ref 65–99)
Glucose-Capillary: 84 mg/dL (ref 65–99)

## 2016-08-27 LAB — CBC
HCT: 25.5 % — ABNORMAL LOW (ref 36.0–46.0)
Hemoglobin: 9 g/dL — ABNORMAL LOW (ref 12.0–15.0)
MCH: 30.7 pg (ref 26.0–34.0)
MCHC: 35.3 g/dL (ref 30.0–36.0)
MCV: 87 fL (ref 78.0–100.0)
PLATELETS: 282 10*3/uL (ref 150–400)
RBC: 2.93 MIL/uL — ABNORMAL LOW (ref 3.87–5.11)
RDW: 13.3 % (ref 11.5–15.5)
WBC: 9.2 10*3/uL (ref 4.0–10.5)

## 2016-08-27 MED ORDER — DEXTROSE 50 % IV SOLN
INTRAVENOUS | Status: AC
  Administered 2016-08-27: 22 mL
  Filled 2016-08-27: qty 50

## 2016-08-27 MED ORDER — ADULT MULTIVITAMIN W/MINERALS CH
1.0000 | ORAL_TABLET | Freq: Every day | ORAL | Status: DC
  Administered 2016-08-27 – 2016-08-30 (×4): 1 via ORAL
  Filled 2016-08-27 (×4): qty 1

## 2016-08-27 MED ORDER — BOOST / RESOURCE BREEZE PO LIQD
1.0000 | Freq: Two times a day (BID) | ORAL | Status: DC
  Administered 2016-08-27 – 2016-08-29 (×3): 1 via ORAL

## 2016-08-27 MED ORDER — DEXTROSE 50 % IV SOLN
16.0000 mL | Freq: Once | INTRAVENOUS | Status: AC
  Administered 2016-08-27: 16 mL via INTRAVENOUS

## 2016-08-27 MED ORDER — DEXTROSE 50 % IV SOLN
INTRAVENOUS | Status: AC
Start: 2016-08-27 — End: 2016-08-27
  Administered 2016-08-27: 25 mL
  Filled 2016-08-27: qty 50

## 2016-08-27 MED ORDER — INSULIN GLARGINE 100 UNIT/ML ~~LOC~~ SOLN
5.0000 [IU] | Freq: Every day | SUBCUTANEOUS | Status: DC
  Administered 2016-08-28 – 2016-08-29 (×2): 5 [IU] via SUBCUTANEOUS
  Filled 2016-08-27 (×4): qty 0.05

## 2016-08-27 NOTE — Progress Notes (Signed)
Hypoglycemic Event  CBG: 35  Treatment: D50 IV 50 mL & 15 GM snack  Symptoms: Weakness; dizziness  Follow-up CBG: Time:0507 CBG Result:92  Possible Reasons for Event: Inadequate meal intake  Comments/MD notified: Schorr, NP     Jenelle MagesHAIRSTON, Cherryl Babin R

## 2016-08-27 NOTE — Progress Notes (Signed)

## 2016-08-27 NOTE — Progress Notes (Signed)
Hypoglycemic Event  CBG: 59  Treatment: orange juice    Symptoms: none  Follow-up CBG: Time:1645 CBG Result:157  Possible Reasons for Event: unknown  Comments/MD notified: Protocol initiated     Likisha Alles, Delsa SaleJacqueline Renee

## 2016-08-27 NOTE — Progress Notes (Signed)
Initial Nutrition Assessment  DOCUMENTATION CODES:   Severe malnutrition in context of acute illness/injury, Underweight  INTERVENTION:  - Diet advancement as medically feasible. - Will order daily multivitamin with minerals. - Will order Boost Breeze BID, each supplement provides 250 kcal and 9 grams of protein - Continue to encourage PO intakes. - RD will continue to monitor for additional needs as PO intakes improve/diet is advanced.  NUTRITION DIAGNOSIS:   Inadequate oral intake related to acute illness, nausea, vomiting as evidenced by per patient/family report.  GOAL:   Patient will meet greater than or equal to 90% of their needs  MONITOR:   PO intake, Diet advancement, Weight trends, Labs, I & O's  REASON FOR ASSESSMENT:   Malnutrition Screening Tool  ASSESSMENT:   70 y.o. female with medical history significant of DM1, HTN, CKD3, hypothyroidism who has been vomiting for the past 3 days. She had a normal blood glucose yesterday but today she was having high readings on her glucometer and therefore presented to the hospital. She is found to have DKA and is therefore being admitted. She has not noted any blood in her vomitus. She has mild diffuse abdominal pain. Has not had diarrhea. Has not had fevers or chills.   Pt seen for MST. BMI indicates underweight status. Pt on CLD and states she had a few sips each of grape juice, coffee, and tea this AM which did not exacerbate nausea. PTA, pt had been experiencing N/V for 4-5 days and was only able to take a few bites of food during that time frame. She states that symptoms were worsened when she attempted to consume anything PO.   Pt states that she has difficulty controlling blood sugars at home; per rounds, pt is a brittle diabetic. Pt states that she will wake up in the middle of the night and CBG will be in the 30s so she will eat a snack and recheck it in a few hours and it has spiked into the 300 or 400s. Pt states that  when CBGs are elevated she experiences vertigo and vomiting. Pt reports that she has hx of pancreatitis and that she had a severe episode of this ~5 years ago. Pt also reports that at times she will experience emesis in the AM which is undigested food from dinner; will need to ask pt more about this at follow-up; no documented hx of gastroparesis.   Unable to complete physical assessment at this time per pt request; will attempt at follow-up. Pt appears to have some degree of muscle and fat wasting to upper body. She reports that over the past ~1 week she has lost 4 lbs. Based on CBW, this would indicate 4% body weight loss which is significant for this time frame. Per chart review, pt has lost 5 lbs (4.8% body weight) in the past month which is also significant for time frame.   Pt meets criteria for malnutrition based on weight loss and <50% intakes for >/= 5 days.   Medications reviewed; sliding scale Novolog, 5 units Lantus/day, 75 mcg Synthroid/day, PRN IV Reglan, PRN Zofran, 40 mg oral Protonix BID, 10 mEq IV KCl x2 runs yesterday.   Labs reviewed; CBGs: 32-105 mg/dL since midnight, BUN: 36 mg/dL, creatinine: 1.611.79 mg/dL, Ca: 8.2 mg/dL, lipase: 096199 units/L, GFR: 28 mL/min. IVF: NS @ 100 mL/hr.     Diet Order:  Diet clear liquid Room service appropriate? Yes; Fluid consistency: Thin  Skin:  Reviewed, no issues  Last BM:  9/9  Height:   Ht Readings from Last 1 Encounters:  08/26/16 5\' 2"  (1.575 m)    Weight:   Wt Readings from Last 1 Encounters:  08/27/16 99 lb 6.8 oz (45.1 kg)    Ideal Body Weight:  50 kg  BMI:  Body mass index is 18.19 kg/m.  Estimated Nutritional Needs:   Kcal:  1350-1550  Protein:  45-55 grams  Fluid:  >/= 1.5 L/day  EDUCATION NEEDS:   No education needs identified at this time    Trenton Gammon, MS, RD, LDN Inpatient Clinical Dietitian Pager # [phone] After hours/weekend pager # [phone]

## 2016-08-27 NOTE — Progress Notes (Signed)
PROGRESS NOTE    Cassandra AbbeBillie A Carver  WUJ:[id number] DOB: [birth year] DOA: 08/26/2016  PCP: Irving CopasHACKER,ROBERT KELLER, MD   Brief Narrative/ HPI:  Cassandra [name] is a 70 y.o. female with medical history significant of DM1, HTN, CKD3, hypothyroidism who has been vomiting for the past 3 days. She had a normal blood glucose on the day before admission but on the day she presented to there ER,  she was having high readings on her glucometer. She was found to have DKA and therefore admitted. She has not noted any blood in her vomitus. She has mild diffuse abdominal pain. Has not had diarrhea. Has not had fevers or chills.   Subjective: No nausea today. Upper abdomen is mildly sore. No fever, chills, diarrhea.   Assessment & Plan:   Principal Problem:   DKA (diabetic ketoacidoses) in DM 1/ brittle diabetes - started on insulin infusion and noted to have hypoglycemia last night and this AM-  infusion has been stopped - cont to follow for further hypoglycemia- if no further hypoglycemia,  resume 5 U Lantus at bedtime and ISS and follow over night in SDU - close f/u on Bmet  Hyponatremia/ hyperkalemia - electrolyte abnormalities coreccted with hydration  Vomiting - initially suspected to be viral vs due to DKA?  - Patient tells me today that she has had pancreatitis in the past and thinks this may have been a recurrence- checked Lipase which is 199 and therefore, it is likely she has pancreatitis - no vomiting in past 24 hrs- start clear liquids today - Zofran and Reglan PRN  Hypertension - resumed Coreg- holding other antihypertensives for now    AKI on CKD (chronic kidney disease) stage 3, GFR 30-59 ml/min - baseline Cr 1.2 - 1.5 - cont IVF as this is likley prerenal-Cr noted to be improving and nearing baseline - cont to hold Lasix, Cozaar- no CHF noted on last ECHO on 7/17 and therefore reason for Lasix uncertain  Hypothyroidism - synthroid  Anemia - chronic issue- stable     DVT  prophylaxis: Heparin Code Status: Full code Family Communication: husband Disposition Plan: home in 1-2 days Consultants:   none Procedures:   none Antimicrobials:  Anti-infectives    None       Objective: Vitals:   08/27/16 0405 08/27/16 0500 08/27/16 0800 08/27/16 1000  BP:   (!) 131/50 (!) 123/50  Pulse:      Resp:   16 14  Temp: 98.3 F (36.8 C)  98.9 F (37.2 C)   TempSrc: Oral  Oral   SpO2:   99% 96%  Weight:  45.1 kg (99 lb 6.8 oz)    Height:        Intake/Output Summary (Last 24 hours) at 08/27/16 1102 Last data filed at 08/27/16 1011  Gross per 24 hour  Intake           1246.7 ml  Output              350 ml  Net            896.7 ml   Filed Weights   08/26/16 1853 08/27/16 0500  Weight: 44.6 kg (98 lb 5.2 oz) 45.1 kg (99 lb 6.8 oz)    Examination: General exam: Appears comfortable  HEENT: PERRLA, oral mucosa moist, no sclera icterus or thrush Respiratory system: Clear to auscultation. Respiratory effort normal. Cardiovascular system: S1 & S2 heard, RRR.  No murmurs  Gastrointestinal system: Abdomen soft,  tender in epigastrium, nondistended.  Normal bowel sound. No organomegaly Central nervous system: Alert and oriented. No focal neurological deficits. Extremities: No cyanosis, clubbing or edema Skin: No rashes or ulcers Psychiatry:  Mood & affect appropriate.     Data Reviewed: I have personally reviewed following labs and imaging studies  CBC:  Recent Labs Lab 08/26/16 1510 08/27/16 0340  WBC 9.2 9.2  HGB 11.0* 9.0*  HCT 35.0* 25.5*  MCV 96.4 87.0  PLT 357 282   Basic Metabolic Panel:  Recent Labs Lab 08/26/16 1510 08/26/16 1933 08/26/16 2230 08/27/16 0340  NA 128* 135 138 136  K 5.2* 3.6 3.2* 3.8  CL 94* 105 108 107  CO2 11* 18* 22 23  GLUCOSE 848* 413* 121* 56*  BUN 45* 43* 41* 36*  CREATININE 2.58* 2.31* 1.99* 1.79*  CALCIUM 9.0 8.5* 8.4* 8.2*   GFR: Estimated Creatinine Clearance: 21.1 mL/min (by C-G formula based  on SCr of 1.79 mg/dL). Liver Function Tests: No results for input(s): AST, ALT, ALKPHOS, BILITOT, PROT, ALBUMIN in the last 168 hours.  Recent Labs Lab 08/27/16 0340  LIPASE 199*   No results for input(s): AMMONIA in the last 168 hours. Coagulation Profile: No results for input(s): INR, PROTIME in the last 168 hours. Cardiac Enzymes:  Recent Labs Lab 08/26/16 1602  TROPONINI <0.03   BNP (last 3 results) No results for input(s): PROBNP in the last 8760 hours. HbA1C: No results for input(s): HGBA1C in the last 72 hours. CBG:  Recent Labs Lab 08/27/16 0211 08/27/16 0402 08/27/16 0404 08/27/16 0507 08/27/16 0759  GLUCAP 101* 32* 36* 92 105*   Lipid Profile: No results for input(s): CHOL, HDL, LDLCALC, TRIG, CHOLHDL, LDLDIRECT in the last 72 hours. Thyroid Function Tests: No results for input(s): TSH, T4TOTAL, FREET4, T3FREE, THYROIDAB in the last 72 hours. Anemia Panel: No results for input(s): VITAMINB12, FOLATE, FERRITIN, TIBC, IRON, RETICCTPCT in the last 72 hours. Urine analysis:    Component Value Date/Time   COLORURINE YELLOW 08/26/2016 1530   APPEARANCEUR CLOUDY (A) 08/26/2016 1530   APPEARANCEUR Clear 06/15/2014 1508   LABSPEC 1.026 08/26/2016 1530   LABSPEC 1.010 06/15/2014 1508   PHURINE 5.0 08/26/2016 1530   GLUCOSEU >1000 (A) 08/26/2016 1530   GLUCOSEU Negative 06/15/2014 1508   HGBUR TRACE (A) 08/26/2016 1530   BILIRUBINUR NEGATIVE 08/26/2016 1530   BILIRUBINUR Negative 06/15/2014 1508   KETONESUR 40 (A) 08/26/2016 1530   PROTEINUR 100 (A) 08/26/2016 1530   UROBILINOGEN 0.2 09/14/2015 1617   NITRITE NEGATIVE 08/26/2016 1530   LEUKOCYTESUR NEGATIVE 08/26/2016 1530   LEUKOCYTESUR Negative 06/15/2014 1508   Sepsis Labs: @LABRCNTIP (procalcitonin:4,lacticidven:4) ) Recent Results (from the past 240 hour(s))  MRSA PCR Screening     Status: None   Collection Time: 08/26/16  6:50 PM  Result Value Ref Range Status   MRSA by PCR NEGATIVE NEGATIVE  Final    Comment:        The GeneXpert MRSA Assay (FDA approved for NASAL specimens only), is one component of a comprehensive MRSA colonization surveillance program. It is not intended to diagnose MRSA infection nor to guide or monitor treatment for MRSA infections.          Radiology Studies: No results found.    Scheduled Meds: . carvedilol  3.125 mg Oral BID WC  . escitalopram  10 mg Oral QHS  . heparin  5,000 Units Subcutaneous Q8H  . insulin aspart  0-9 Units Subcutaneous TID WC  . insulin glargine  5 Units Subcutaneous QHS  . levothyroxine  75 mcg Oral QAC breakfast  . pantoprazole  40 mg Oral BID  . sodium chloride flush  3 mL Intravenous Q12H   Continuous Infusions: . sodium chloride 100 mL/hr at 08/27/16 0801     LOS: 1 day    Time spent in minutes: 35    Akosua Constantine, MD [facility] Pager: [email or web address] Password [email or web address] 08/27/2016, 11:02 AM

## 2016-08-27 NOTE — Care Management Note (Signed)
Case Management Note  Patient Details  Name: Cassandra [name] MRN: [id number] Date of Birth: [birth year]  Subjective/Objective:    Diabetes and hypoerglycemia                Action/Plan: home when stable   Expected Discharge Date:   (unknown)               Expected Discharge Plan:  Home/Self Care  In-House Referral:  NA  Discharge planning Services     Post Acute Care Choice:    Choice offered to:     DME Arranged:    DME Agency:     HH Arranged:    HH Agency:     Status of Service:  In process, will continue to follow  If discussed at Long Length of Stay Meetings, dates discussed:    Additional Comments:Date:  August 27, 2016 Chart reviewed for concurrent status and case management needs. Will continue to follow the patient for status change: Discharge Planning: following for needs Expected discharge date: [id number] Marcelle SmilingRhonda Kurstin Dimarzo, BSN, [place]3, [place]   [phone]  Golda Acreavis, Panagiotis Oelkers Lynn, RN 08/27/2016, 9:42 AM

## 2016-08-27 NOTE — Progress Notes (Signed)
Inpatient Diabetes Program Recommendations  AACE/ADA: New Consensus Statement on Inpatient Glycemic Control (2015)  Target Ranges:  Prepandial:   less than 140 mg/dL      Peak postprandial:   less than 180 mg/dL (1-2 hours)      Critically ill patients:  140 - 180 mg/dL   Lab Results  Component Value Date   GLUCAP 105 (H) 08/27/2016   HGBA1C 7.2 (H) 07/13/2016    Review of Glycemic Control  Diabetes history: DM1 Outpatient Diabetes medications: Lantus 10 units QD, preveously on Novolog 0-15 units tidwc Current orders for Inpatient glycemic control: Lantus 10 units QAM, Novolog 0-9 units tidwc.  Inpatient Diabetes Program Recommendations:    May need decrease in Lantus with hx hypoglycemia on previous admissions. Consider decreasing Lantus to 8 units QAM.  Will follow. Needs to f/u with endocrinologist when discharged.  Thank you. Ailene Ardshonda Kinberly Perris, RD, LDN, CDE Inpatient Diabetes Coordinator [phone]

## 2016-08-28 DIAGNOSIS — E43 Unspecified severe protein-calorie malnutrition: Secondary | ICD-10-CM | POA: Insufficient documentation

## 2016-08-28 DIAGNOSIS — E0810 Diabetes mellitus due to underlying condition with ketoacidosis without coma: Secondary | ICD-10-CM

## 2016-08-28 LAB — BASIC METABOLIC PANEL
Anion gap: 6 (ref 5–15)
BUN: 23 mg/dL — AB (ref 6–20)
CO2: 21 mmol/L — ABNORMAL LOW (ref 22–32)
CREATININE: 1.2 mg/dL — AB (ref 0.44–1.00)
Calcium: 7.5 mg/dL — ABNORMAL LOW (ref 8.9–10.3)
Chloride: 112 mmol/L — ABNORMAL HIGH (ref 101–111)
GFR calc Af Amer: 52 mL/min — ABNORMAL LOW (ref >60)
GFR, EST NON AFRICAN AMERICAN: 45 mL/min — AB (ref >60)
Glucose, Bld: 41 mg/dL — CL (ref 65–99)
POTASSIUM: 3.6 mmol/L (ref 3.5–5.1)
SODIUM: 139 mmol/L (ref 135–145)

## 2016-08-28 LAB — GLUCOSE, CAPILLARY
GLUCOSE-CAPILLARY: 144 mg/dL — AB (ref 65–99)
GLUCOSE-CAPILLARY: 38 mg/dL — AB (ref 65–99)
GLUCOSE-CAPILLARY: 153 mg/dL — AB (ref 65–99)
GLUCOSE-CAPILLARY: 224 mg/dL — AB (ref 65–99)
Glucose-Capillary: 32 mg/dL — CL (ref 65–99)
Glucose-Capillary: 148 mg/dL — ABNORMAL HIGH (ref 65–99)
Glucose-Capillary: 109 mg/dL — ABNORMAL HIGH (ref 65–99)

## 2016-08-28 LAB — LIPASE, BLOOD: LIPASE: 141 U/L — AB (ref 11–51)

## 2016-08-28 MED ORDER — VITAMIN B-1 100 MG PO TABS
100.0000 mg | ORAL_TABLET | Freq: Every day | ORAL | Status: DC
  Administered 2016-08-28 – 2016-08-30 (×3): 100 mg via ORAL
  Filled 2016-08-28 (×3): qty 1

## 2016-08-28 MED ORDER — SACCHAROMYCES BOULARDII 250 MG PO CAPS
250.0000 mg | ORAL_CAPSULE | Freq: Two times a day (BID) | ORAL | Status: DC
  Administered 2016-08-28 – 2016-08-30 (×4): 250 mg via ORAL
  Filled 2016-08-28 (×4): qty 1

## 2016-08-28 MED ORDER — AMLODIPINE BESYLATE 10 MG PO TABS
10.0000 mg | ORAL_TABLET | Freq: Every day | ORAL | Status: DC
  Administered 2016-08-28 – 2016-08-30 (×3): 10 mg via ORAL
  Filled 2016-08-28 (×3): qty 1

## 2016-08-28 MED ORDER — DEXTROSE 50 % IV SOLN
INTRAVENOUS | Status: AC
  Administered 2016-08-28: 25 mL
  Filled 2016-08-28: qty 50

## 2016-08-28 MED ORDER — ASPIRIN EC 325 MG PO TBEC
325.0000 mg | DELAYED_RELEASE_TABLET | Freq: Every day | ORAL | Status: DC
  Administered 2016-08-28 – 2016-08-30 (×3): 325 mg via ORAL
  Filled 2016-08-28 (×3): qty 1

## 2016-08-28 MED ORDER — POLYSACCHARIDE IRON COMPLEX 150 MG PO CAPS
150.0000 mg | ORAL_CAPSULE | Freq: Two times a day (BID) | ORAL | Status: DC
  Administered 2016-08-28 – 2016-08-30 (×4): 150 mg via ORAL
  Filled 2016-08-28 (×4): qty 1

## 2016-08-28 MED ORDER — HYDROCORTISONE 2.5 % RE CREA
TOPICAL_CREAM | Freq: Four times a day (QID) | RECTAL | Status: DC
  Administered 2016-08-28: 22:00:00 via RECTAL
  Filled 2016-08-28: qty 28.35

## 2016-08-28 MED ORDER — LOSARTAN POTASSIUM 50 MG PO TABS
100.0000 mg | ORAL_TABLET | Freq: Every day | ORAL | Status: DC
  Administered 2016-08-28 – 2016-08-30 (×3): 100 mg via ORAL
  Filled 2016-08-28 (×3): qty 2

## 2016-08-28 MED ORDER — MECLIZINE HCL 25 MG PO TABS
25.0000 mg | ORAL_TABLET | Freq: Three times a day (TID) | ORAL | Status: DC | PRN
  Administered 2016-08-30: 25 mg via ORAL
  Filled 2016-08-28 (×3): qty 1

## 2016-08-28 NOTE — Progress Notes (Signed)
Inpatient Diabetes Program Recommendations  AACE/ADA: New Consensus Statement on Inpatient Glycemic Control (2015)  Target Ranges:  Prepandial:   less than 140 mg/dL      Peak postprandial:   less than 180 mg/dL (1-2 hours)      Critically ill patients:  140 - 180 mg/dL   Lab Results  Component Value Date   GLUCAP 153 (H) 08/28/2016   HGBA1C 7.2 (H) 07/13/2016  Results for Wachter, Jalecia A (MRN [id number]) as of 08/28/2016 10:18  Ref. Range 08/28/2016 03:43 08/28/2016 04:01 08/28/2016 08:03  Glucose-Capillary Latest Ref Range: 65 - 99 mg/dL 38 (LL) 409144 (H) 811153 (H)    Review of Glycemic Control  Very labile blood sugars. Pt refused Lantus 5 units QHS last night. Had blood sugars of 38 and 41 this am. ? Hypoglycemia this am when no insulin was given since 9/12 at 1249 - Novolog 2 units. Pt eating 75-100% meals.  Will continue to follow. Agree with Lantus 5 units Q24H.  Thank you. Ailene Ardshonda Letina Luckett, RD, LDN, CDE Inpatient Diabetes Coordinator [phone]

## 2016-08-28 NOTE — Progress Notes (Signed)
Hypoglycemic Event  CBG: 38   Treatment: D50 IV 25 mL  Symptoms: None  Follow-up CBG: Time:0401 CBG Result:144  Possible Reasons for Event: Inadequate meal intake  Comments/MD notified:protocol followed    Cassandra CoeShannon N Ezme Duch

## 2016-08-28 NOTE — Progress Notes (Addendum)
PROGRESS NOTE    Cassandra [name]  ZOX:[id number] DOB: 06-06-1946 DOA: 08/26/2016  PCP: Irving Copas, MD   Brief Narrative/ HPI:  70 y.o. female with medical history significant of DM1, HTN, CKD3, hypothyroidism who has been vomiting for the past 3 days. She had a normal blood glucose on the day before admission but on the day she presented to there ER,  she was having high readings on her glucometer. She was found to have DKA and therefore admitted.   Subjective: No nausea today. Upper abdomen is mildly sore. Wants to have diet advanced.   Assessment & Plan:   Principal Problem:   DKA (diabetic ketoacidoses) in DM 1/ brittle diabetes - started on insulin infusion and noted to have hypoglycemia - has been off insulin drip - currently on Lantus and SSI - OK to advance diet and move to regular bed  - BMP in AM  Hyponatremia/ hyperkalemia - electrolyte abnormalities coreccted with hydration - BMP in AM  Vomiting - initially suspected to be viral vs due to DKA?  - no vomiting, wants to have diet advanced   Hypertension - resume home medical regimen   AKI on CKD (chronic kidney disease) stage 3, GFR 30-59 ml/min - baseline Cr 1.2 - 1.5 - IVF and Cr trending down - BMP In AM  Hypothyroidism - continue synthroid  Anemia of chronic disease  - stable   DVT prophylaxis: Heparin SQ Code Status: Full code Family Communication: pt at bedside  Disposition Plan: home in AM  Consultants:   None  Procedures:   None  Antimicrobials:   None   Objective: Vitals:   08/28/16 0500 08/28/16 0600 08/28/16 0800 08/28/16 1200  BP:   (!) 147/59 (!) 164/70  Pulse:      Resp:   10 13  Temp: 97.6 F (36.4 C)  98.6 F (37 C)   TempSrc: Oral  Oral   SpO2:   99% 93%  Weight:  47 kg (103 lb 9.9 oz)    Height:        Intake/Output Summary (Last 24 hours) at 08/28/16 1425 Last data filed at 08/28/16 0936  Gross per 24 hour  Intake             2620 ml  Output               300 ml  Net             2320 ml   Filed Weights   08/26/16 1853 08/27/16 0500 08/28/16 0600  Weight: 44.6 kg (98 lb 5.2 oz) 45.1 kg (99 lb 6.8 oz) 47 kg (103 lb 9.9 oz)    Examination: General exam: Appears comfortable  HEENT: PERRLA, oral mucosa moist, no sclera icterus or thrush Respiratory system: Clear to auscultation. Respiratory effort normal. Cardiovascular system: S1 & S2 heard, RRR.  No murmurs  Gastrointestinal system: Abdomen soft, nondistended. Normal bowel sound. No organomegaly Central nervous system: Alert and oriented. No focal neurological deficits.  Data Reviewed: I have personally reviewed following labs and imaging studies  CBC:  Recent Labs Lab 08/26/16 1510 08/27/16 0340  WBC 9.2 9.2  HGB 11.0* 9.0*  HCT 35.0* 25.5*  MCV 96.4 87.0  PLT 357 282   Basic Metabolic Panel:  Recent Labs Lab 08/26/16 1510 08/26/16 1933 08/26/16 2230 08/27/16 0340 08/28/16 0330  NA 128* 135 138 136 139  K 5.2* 3.6 3.2* 3.8 3.6  CL 94* 105 108 107 112*  CO2 11* 18* 22  23 21*  GLUCOSE 848* 413* 121* 56* 41*  BUN 45* 43* 41* 36* 23*  CREATININE 2.58* 2.31* 1.99* 1.79* 1.20*  CALCIUM 9.0 8.5* 8.4* 8.2* 7.5*    Recent Labs Lab 08/27/16 0340 08/28/16 0330  LIPASE 199* 141*   Cardiac Enzymes:  Recent Labs Lab 08/26/16 1602  TROPONINI <0.03   CBG:  Recent Labs Lab 08/27/16 2135 08/28/16 0343 08/28/16 0401 08/28/16 0803 08/28/16 1145  GLUCAP 84 38* 144* 153* 148*    Recent Results (from the past 240 hour(s))  MRSA PCR Screening     Status: None   Collection Time: 08/26/16  6:50 PM  Result Value Ref Range Status   MRSA by PCR NEGATIVE NEGATIVE Final    Radiology Studies: No results found.   Scheduled Meds: . carvedilol  3.125 mg Oral BID WC  . escitalopram  10 mg Oral QHS  . feeding supplement  1 Container Oral BID BM  . heparin  5,000 Units Subcutaneous Q8H  . insulin aspart  0-9 Units Subcutaneous TID WC  . insulin glargine  5 Units  Subcutaneous QHS  . levothyroxine  75 mcg Oral QAC breakfast  . multivitamin with minerals  1 tablet Oral Daily  . pantoprazole  40 mg Oral BID  . sodium chloride flush  3 mL Intravenous Q12H   Continuous Infusions: . sodium chloride 100 mL/hr at 08/28/16 0225     LOS: 2 days  Time spent in minutes: 35  MAGICK-Kenzey Birkland, MD [facility] Pager:[phone] [email or web address] Password [email or web address] 08/28/2016, 2:25 PM

## 2016-08-29 DIAGNOSIS — E11649 Type 2 diabetes mellitus with hypoglycemia without coma: Secondary | ICD-10-CM

## 2016-08-29 DIAGNOSIS — I951 Orthostatic hypotension: Secondary | ICD-10-CM

## 2016-08-29 LAB — GLUCOSE, CAPILLARY
GLUCOSE-CAPILLARY: 119 mg/dL — AB (ref 65–99)
Glucose-Capillary: 322 mg/dL — ABNORMAL HIGH (ref 65–99)
Glucose-Capillary: 296 mg/dL — ABNORMAL HIGH (ref 65–99)
Glucose-Capillary: 147 mg/dL — ABNORMAL HIGH (ref 65–99)

## 2016-08-29 LAB — CBC
HEMATOCRIT: 24.4 % — AB (ref 36.0–46.0)
Hemoglobin: 8.2 g/dL — ABNORMAL LOW (ref 12.0–15.0)
MCH: 31.1 pg (ref 26.0–34.0)
MCHC: 33.6 g/dL (ref 30.0–36.0)
MCV: 92.4 fL (ref 78.0–100.0)
PLATELETS: 207 10*3/uL (ref 150–400)
RBC: 2.64 MIL/uL — ABNORMAL LOW (ref 3.87–5.11)
RDW: 14 % (ref 11.5–15.5)
WBC: 6.2 10*3/uL (ref 4.0–10.5)

## 2016-08-29 LAB — BASIC METABOLIC PANEL
ANION GAP: 4 — AB (ref 5–15)
BUN: 17 mg/dL (ref 6–20)
CALCIUM: 7 mg/dL — AB (ref 8.9–10.3)
CO2: 20 mmol/L — AB (ref 22–32)
CREATININE: 1.44 mg/dL — AB (ref 0.44–1.00)
Chloride: 110 mmol/L (ref 101–111)
GFR, EST AFRICAN AMERICAN: 42 mL/min — AB (ref >60)
GFR, EST NON AFRICAN AMERICAN: 36 mL/min — AB (ref >60)
Glucose, Bld: 324 mg/dL — ABNORMAL HIGH (ref 65–99)
Potassium: 4.6 mmol/L (ref 3.5–5.1)
SODIUM: 134 mmol/L — AB (ref 135–145)

## 2016-08-29 NOTE — Progress Notes (Signed)
Inpatient Diabetes Program Recommendations  AACE/ADA: New Consensus Statement on Inpatient Glycemic Control (2015)  Target Ranges:  Prepandial:   less than 140 mg/dL      Peak postprandial:   less than 180 mg/dL (1-2 hours)      Critically ill patients:  140 - 180 mg/dL   Lab Results  Component Value Date   GLUCAP 119 (H) 08/29/2016   HGBA1C 7.2 (H) 07/13/2016    Review of Glycemic Control  Eating approx 75% meals.  Post-prandial blood sugars elevated. Needs meal coverage insulin.  Inpatient Diabetes Program Recommendations:    Consider addition of Novolog 3 units tidwc.  Will follow daily. Thank you. Cassandra [name], RD, LDN, CDE Inpatient Diabetes Coordinator [phone]

## 2016-08-29 NOTE — Progress Notes (Addendum)
PROGRESS NOTE    Cassandra [name]  ZOX:[id number] DOB: 07/23/1946 DOA: 08/26/2016  PCP: Irving Copas, MD   Brief Narrative/ HPI:  70 y.o. female with medical history significant of DM1, HTN, CKD3, hypothyroidism who has been vomiting for the past 3 days. She had a normal blood glucose on the day before admission but on the day she presented to there ER,  she was having high readings on her glucometer. She was found to have DKA and therefore admitted.   Subjective: No nausea today. Upper abdomen is mildly sore. Still feels dizzy and worried about ambulating.   Assessment & Plan:   Principal Problem:   DKA (diabetic ketoacidoses) in DM 1/ brittle diabetes - started on insulin infusion and noted to have hypoglycemia - has been off insulin drip for over 48 hours  - currently on Lantus and SSI - diet advanced and pt has tolerated well  - BMP in AM  Hyponatremia/ hypokalemia  - electrolyte abnormalities coreccted with hydration - BMP in AM  Vomiting - secondary to acute pancreatitis exacerbated by DKA and in pt with known alcohol use - will repeat lipase in AM - pt so far tolerating diet well   Hypertension - reasonable inpatient control   AKI on CKD (chronic kidney disease) stage 3, GFR 30-59 ml/min - baseline Cr 1.2 - 1.5 - Cr is up a bit since yesterday even though pt is still on IVF - will continue IVF today and repeat BMP in AM  Dizzy - possibly from dehydration but since pt eating now and taking in fluid not sure why still dizzy - will ask RN to check orthostatic vitals and PT to see as well - possible d/c in AM  Hypothyroidism - continue synthroid  Anemia of chronic disease  - stable with no signs of bleeding   DVT prophylaxis: Heparin SQ Code Status: Full code Family Communication: pt at bedside  Disposition Plan: home in AM  Consultants:   None  Procedures:   None  Antimicrobials:   None   Objective: Vitals:   08/28/16 1753 08/28/16  1815 08/28/16 2231 08/29/16 0524  BP: (!) 126/41 (!) 166/62 (!) 140/59 124/62  Pulse: 68 63 68 62  Resp:  16 16 15   Temp:  98.7 F (37.1 C) 98.6 F (37 C) 98.8 F (37.1 C)  TempSrc:  Oral Oral Oral  SpO2:  99% 97% 96%  Weight:      Height:        Intake/Output Summary (Last 24 hours) at 08/29/16 1344 Last data filed at 08/29/16 0859  Gross per 24 hour  Intake             1310 ml  Output                0 ml  Net             1310 ml   Filed Weights   08/26/16 1853 08/27/16 0500 08/28/16 0600  Weight: 44.6 kg (98 lb 5.2 oz) 45.1 kg (99 lb 6.8 oz) 47 kg (103 lb 9.9 oz)    Examination: General exam: Appears comfortable  HEENT: PERRLA, oral mucosa moist, no sclera icterus or thrush Respiratory system: Clear to auscultation. Respiratory effort normal. Cardiovascular system: S1 & S2 heard, RRR.  No murmurs  Gastrointestinal system: Abdomen soft, nondistended. Normal bowel sound. No organomegaly Central nervous system: Alert and oriented. No focal neurological deficits.  Data Reviewed: I have personally reviewed following labs and imaging studies  CBC:  Recent Labs Lab 08/26/16 1510 08/27/16 0340 08/29/16 0517  WBC 9.2 9.2 6.2  HGB 11.0* 9.0* 8.2*  HCT 35.0* 25.5* 24.4*  MCV 96.4 87.0 92.4  PLT 357 282 207   Basic Metabolic Panel:  Recent Labs Lab 08/26/16 1933 08/26/16 2230 08/27/16 0340 08/28/16 0330 08/29/16 0517  NA 135 138 136 139 134*  K 3.6 3.2* 3.8 3.6 4.6  CL 105 108 107 112* 110  CO2 18* 22 23 21* 20*  GLUCOSE 413* 121* 56* 41* 324*  BUN 43* 41* 36* 23* 17  CREATININE 2.31* 1.99* 1.79* 1.20* 1.44*  CALCIUM 8.5* 8.4* 8.2* 7.5* 7.0*    Recent Labs Lab 08/27/16 0340 08/28/16 0330  LIPASE 199* 141*   Cardiac Enzymes:  Recent Labs Lab 08/26/16 1602  TROPONINI <0.03   CBG:  Recent Labs Lab 08/28/16 1145 08/28/16 1616 08/28/16 2036 08/29/16 0732 08/29/16 1207  GLUCAP 148* 109* 224* 322* 119*    Recent Results (from the past 240  hour(s))  MRSA PCR Screening     Status: None   Collection Time: 08/26/16  6:50 PM  Result Value Ref Range Status   MRSA by PCR NEGATIVE NEGATIVE Final    Radiology Studies: No results found.   Scheduled Meds: . amLODipine  10 mg Oral Daily  . aspirin EC  325 mg Oral Daily  . carvedilol  3.125 mg Oral BID WC  . escitalopram  10 mg Oral QHS  . feeding supplement  1 Container Oral BID BM  . heparin  5,000 Units Subcutaneous Q8H  . hydrocortisone   Rectal QID  . insulin aspart  0-9 Units Subcutaneous TID WC  . insulin glargine  5 Units Subcutaneous QHS  . iron polysaccharides  150 mg Oral BID  . levothyroxine  75 mcg Oral QAC breakfast  . losartan  100 mg Oral Daily  . multivitamin with minerals  1 tablet Oral Daily  . pantoprazole  40 mg Oral BID  . saccharomyces boulardii  250 mg Oral BID  . sodium chloride flush  3 mL Intravenous Q12H  . thiamine  100 mg Oral Daily   Continuous Infusions: . sodium chloride 50 mL/hr at 08/28/16 1900     LOS: 3 days  Time spent in minutes: 35  MAGICK-Reiko Vinje, MD [facility] Pager:[phone] [email or web address] Password [email or web address] 08/29/2016, 1:44 PM

## 2016-08-30 LAB — LIPASE, BLOOD: Lipase: 330 U/L — ABNORMAL HIGH (ref 11–51)

## 2016-08-30 LAB — BASIC METABOLIC PANEL
Anion gap: 4 — ABNORMAL LOW (ref 5–15)
BUN: 19 mg/dL (ref 6–20)
CHLORIDE: 114 mmol/L — AB (ref 101–111)
CO2: 20 mmol/L — AB (ref 22–32)
CREATININE: 1.17 mg/dL — AB (ref 0.44–1.00)
Calcium: 7.3 mg/dL — ABNORMAL LOW (ref 8.9–10.3)
GFR calc non Af Amer: 46 mL/min — ABNORMAL LOW (ref >60)
GFR, EST AFRICAN AMERICAN: 54 mL/min — AB (ref >60)
Glucose, Bld: 116 mg/dL — ABNORMAL HIGH (ref 65–99)
POTASSIUM: 4 mmol/L (ref 3.5–5.1)
Sodium: 138 mmol/L (ref 135–145)

## 2016-08-30 LAB — CBC
HEMATOCRIT: 24.1 % — AB (ref 36.0–46.0)
HEMOGLOBIN: 8.4 g/dL — AB (ref 12.0–15.0)
MCH: 30.9 pg (ref 26.0–34.0)
MCHC: 34.9 g/dL (ref 30.0–36.0)
MCV: 88.6 fL (ref 78.0–100.0)
Platelets: 216 10*3/uL (ref 150–400)
RBC: 2.72 MIL/uL — ABNORMAL LOW (ref 3.87–5.11)
RDW: 13.7 % (ref 11.5–15.5)
WBC: 6.9 10*3/uL (ref 4.0–10.5)

## 2016-08-30 LAB — GLUCOSE, CAPILLARY
GLUCOSE-CAPILLARY: 128 mg/dL — AB (ref 65–99)
GLUCOSE-CAPILLARY: 221 mg/dL — AB (ref 65–99)
Glucose-Capillary: 108 mg/dL — ABNORMAL HIGH (ref 65–99)

## 2016-08-30 MED ORDER — INSULIN GLARGINE 100 UNIT/ML ~~LOC~~ SOLN: 5.0000 [IU] | Freq: Every day | SUBCUTANEOUS | 0 refills | Status: DC

## 2016-08-30 MED ORDER — ESCITALOPRAM OXALATE 10 MG PO TABS: 10.0000 mg | ORAL_TABLET | Freq: Every day | ORAL | 0 refills | Status: DC

## 2016-08-30 MED ORDER — DIAZEPAM 5 MG PO TABS: 5.0000 mg | ORAL_TABLET | Freq: Two times a day (BID) | ORAL | 0 refills | Status: DC | PRN

## 2016-08-30 MED ORDER — DIAZEPAM 5 MG PO TABS
5.0000 mg | ORAL_TABLET | Freq: Once | ORAL | Status: AC
  Administered 2016-08-30: 5 mg via ORAL
  Filled 2016-08-30: qty 1

## 2016-08-30 NOTE — Care Management Important Message (Signed)
Important Message  Patient Details  Name: Merril AbbeBillie A Blasdell MRN: [id number] Date of Birth: 12-19-1945   Medicare Important Message Given:  Yes    Haskell FlirtJamison, Simeon Vera 08/30/2016, 10:15 AMImportant Message  Patient Details  Name: Merril AbbeBillie A Raybuck MRN: [id number] Date of Birth: 12-19-1945   Medicare Important Message Given:  Yes    Haskell FlirtJamison, Deshanta Lady 08/30/2016, 10:15 AM

## 2016-08-30 NOTE — Progress Notes (Signed)
PT Cancellation Note  Patient Details Name: Cassandra [name] MRN: [id number] DOB: 1946/05/19   Cancelled Treatment:    Reason Eval/Treat Not Completed: Medical issues which prohibited therapy (pt is dizzy and nauseous at rest in bed and doesn't feel she can mobilize.  RN aware. Will attempt PT eval later today. )   Tamala SerUhlenberg, Jareth Pardee Kistler 08/30/2016, 11:03 AM [phone]

## 2016-08-30 NOTE — Discharge Summary (Addendum)
Physician Discharge Summary  Cassandra AbbeBillie A [name] WUJ:[id number] DOB: Feb 03, 1946 DOA: 08/26/2016  PCP: Irving CopasHACKER,ROBERT KELLER, MD  Admit date: 08/26/2016 Discharge date: 08/30/2016  Recommendations for Outpatient Follow-up:  1. Pt will need to follow up with PCP in 2-3 weeks post discharge 2. Please obtain BMP to evaluate electrolytes and kidney function 3. Please also check CBC to evaluate Hg and Hct levels 4.   Discharge Diagnoses:  Principal Problem:   DKA (diabetic ketoacidoses) (HCC) Active Problems:   Hypertension   CKD (chronic kidney disease) stage 3, GFR 30-59 ml/min   Hypothyroidism   Benign hypertension with CKD (chronic kidney disease) stage III   Diabetic ketoacidosis with coma associated with type 1 diabetes mellitus (HCC)   Protein-calorie malnutrition, severe  Discharge Condition: Stable  Diet recommendation: Heart healthy diet discussed in details   Brief Narrative/ HPI:  70 y.o.femalewith medical history significant of DM1, HTN, CKD3, hypothyroidism who has been vomiting for the past 3 days. She had a normal blood glucose on the day before admission but on the day she presented to there ER,  she was having high readings on her glucometer. She was found to have DKA and therefore admitted.   Assessment & Plan:   Principal Problem:   DKA (diabetic ketoacidoses) in DM 1/ brittle diabetes - likely secondary to acute pancreatitis and ? Alcohol use  - has been off insulin drip for over 48 hours  - diet advanced and pt has tolerating well   Hyponatremia/ hypokalemia  - electrolyte abnormalities coreccted with hydration  Vomiting - secondary to acute pancreatitis in pt with known alcohol abuse - exacerbated by DKA - resolve d  Hypertension - reasonable inpatient control   AKI on CKD (chronic kidney disease) stage 3, GFR 30-59 ml/min - baseline Cr 1.2 - 1.5 - IVF provided and Cr trending own   Dizzy - possibly from dehydration  - ambulated well  - HH PT  ordered   Hypothyroidism - continue synthroid  Anemia of chronic disease  - stable with no signs of bleeding   DVT prophylaxis: Heparin SQ Code Status: Full code Family Communication: pt at bedside  Disposition Plan: home  Consultants:   None  Procedures:   None  Antimicrobials:   None    Discharge Exam: Vitals:   08/30/16 0746 08/30/16 1014  BP:  136/67  Pulse: (!) 58 64  Resp:    Temp:     Vitals:   08/30/16 0550 08/30/16 0745 08/30/16 0746 08/30/16 1014  BP: (!) 155/72 (!) 152/66  136/67  Pulse: 64 67 (!) 58 64  Resp: 18 18    Temp: 98.7 F (37.1 C) 98.5 F (36.9 C)    TempSrc: Oral Oral    SpO2: 97% 98%    Weight: 54 kg (119 lb 0.8 oz)     Height:        General: Pt is alert, follows commands appropriately, not in acute distress Cardiovascular: Regular rate and rhythm, S1/S2 +, no murmurs, no rubs, no gallops Respiratory: Clear to auscultation bilaterally, no wheezing, no crackles, no rhonchi Abdominal: Soft, non tender, non distended, bowel sounds +, no guarding Extremities: no edema, no cyanosis, pulses palpable bilaterally DP and PT Neuro: Grossly nonfocal  Discharge Instructions     Medication List    STOP taking these medications   cefUROXime 250 MG tablet Commonly known as:  CEFTIN   furosemide 20 MG tablet Commonly known as:  LASIX   insulin aspart 100 UNIT/ML injection Commonly known as:  novoLOG   saccharomyces boulardii 250 MG capsule Commonly known as:  FLORASTOR   thiamine 100 MG tablet   witch hazel-glycerin pad Commonly known as:  TUCKS     TAKE these medications   amLODipine 10 MG tablet Commonly known as:  NORVASC Take 10 mg by mouth daily.   aspirin EC 325 MG tablet Take 325 mg by mouth daily. What changed:  Another medication with the same name was removed. Continue taking this medication, and follow the directions you see here.   atorvastatin 10 MG tablet Commonly known as:  LIPITOR Take 1 tablet (10  mg total) by mouth daily at 6 PM.   carvedilol 3.125 MG tablet Commonly known as:  COREG Take 1 tablet (3.125 mg total) by mouth 2 (two) times daily with a meal.   diazepam 5 MG tablet Commonly known as:  VALIUM Take 1 tablet (5 mg total) by mouth every 12 (twelve) hours as needed for anxiety.   escitalopram 10 MG tablet Commonly known as:  LEXAPRO Take 1 tablet (10 mg total) by mouth at bedtime.   feeding supplement (GLUCERNA SHAKE) Liqd Take 237 mLs by mouth 3 (three) times daily between meals.   folic acid 1 MG tablet Commonly known as:  FOLVITE Take 1 tablet (1 mg total) by mouth daily.   hydrocortisone 2.5 % rectal cream Commonly known as:  ANUSOL-HC Place rectally 4 (four) times daily.   insulin glargine 100 UNIT/ML injection Commonly known as:  LANTUS Inject 0.05 mLs (5 Units total) into the skin daily. What changed:  how much to take   insulin lispro 100 UNIT/ML injection Commonly known as:  HUMALOG Inject 3-10 Units into the skin 3 (three) times daily as needed for high blood sugar.   iron polysaccharides 150 MG capsule Commonly known as:  NIFEREX Take 1 capsule (150 mg total) by mouth 2 (two) times daily. What changed:  when to take this   LORazepam 0.5 MG tablet Commonly known as:  ATIVAN Take 1 tablet (0.5 mg total) by mouth every 8 (eight) hours as needed for anxiety.   losartan 100 MG tablet Commonly known as:  COZAAR Take 100 mg by mouth daily.   meclizine 25 MG tablet Commonly known as:  ANTIVERT Take 1 tablet (25 mg total) by mouth 3 (three) times daily as needed for dizziness.   polyethylene glycol packet Commonly known as:  MIRALAX / GLYCOLAX Take 17 g by mouth every other day. What changed:  when to take this  reasons to take this      Follow-up Information    Irving Copas, MD .   Specialty:  Family Medicine Contact information: [phone] [address], [address] [place] [address] [phone]            The results of  significant diagnostics from this hospitalization (including imaging, microbiology, ancillary and laboratory) are listed below for reference.     Microbiology: Recent Results (from the past 240 hour(s))  MRSA PCR Screening     Status: None   Collection Time: 08/26/16  6:50 PM  Result Value Ref Range Status   MRSA by PCR NEGATIVE NEGATIVE Final    Comment:        The GeneXpert MRSA Assay (FDA approved for NASAL specimens only), is one component of a comprehensive MRSA colonization surveillance program. It is not intended to diagnose MRSA infection nor to guide or monitor treatment for MRSA infections.      Labs: Basic Metabolic Panel:  Recent  Labs Lab 08/26/16 2230 08/27/16 0340 08/28/16 0330 08/29/16 0517 08/30/16 0535  NA 138 136 139 134* 138  K 3.2* 3.8 3.6 4.6 4.0  CL 108 107 112* 110 114*  CO2 22 23 21* 20* 20*  GLUCOSE 121* 56* 41* 324* 116*  BUN 41* 36* 23* 17 19  CREATININE 1.99* 1.79* 1.20* 1.44* 1.17*  CALCIUM 8.4* 8.2* 7.5* 7.0* 7.3*   Liver Function Tests: No results for input(s): AST, ALT, ALKPHOS, BILITOT, PROT, ALBUMIN in the last 168 hours.  Recent Labs Lab 08/27/16 0340 08/28/16 0330 08/30/16 0535  LIPASE 199* 141* 330*   No results for input(s): AMMONIA in the last 168 hours. CBC:  Recent Labs Lab 08/26/16 1510 08/27/16 0340 08/29/16 0517 08/30/16 0535  WBC 9.2 9.2 6.2 6.9  HGB 11.0* 9.0* 8.2* 8.4*  HCT 35.0* 25.5* 24.4* 24.1*  MCV 96.4 87.0 92.4 88.6  PLT 357 282 207 216   Cardiac Enzymes:  Recent Labs Lab 08/26/16 1602  TROPONINI <0.03   BNP: BNP (last 3 results)  Recent Labs  07/11/16 1600  BNP 469.1*    ProBNP (last 3 results) No results for input(s): PROBNP in the last 8760 hours.  CBG:  Recent Labs Lab 08/29/16 1703 08/29/16 2056 08/30/16 0041 08/30/16 0732 08/30/16 1202  GLUCAP 296* 147* 128* 108* 221*     SIGNED: Time coordinating discharge: Over 30 minutes  Debbora Presto, MD  [facility] 08/30/2016, 12:37 PM Pager [phone]  If 7PM-7AM, please contact night-coverage [email or web address] Password [email or web address]

## 2016-08-30 NOTE — Evaluation (Signed)
Physical Therapy Evaluation Patient Details Name: Cassandra [name] MRN: [id number] DOB: [birth year] Today's Date: 08/30/2016   History of Present Illness  70 y.o. female admitted with dizziness, vomiting. Dx of DKA, pancreatitis. 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  Clinical Impression  Pt admitted with above diagnosis. Pt currently with functional limitations due to the deficits listed below (see PT Problem List). Pt ambulated 180' with RW without LOB. She reports dizziness at rest and with walking which she states has been present since her CVA in July 2017. She reports she had vertigo for years prior to July which responded well to meclizine. However, since July, the meclizine has not helped.  She also reports having "foggy vision" for the past week. Pt will benefit from skilled PT to increase their independence and safety with mobility to allow discharge to the venue listed below.  HHPT recommended.     Follow Up Recommendations Home health PT    Equipment Recommendations  None recommended by PT    Recommendations for Other Services       Precautions / Restrictions Precautions Precautions: Fall Precaution Comments: 4 falls in past 1 year Restrictions Weight Bearing Restrictions: No      Mobility  Bed Mobility Overal bed mobility: Needs Assistance Bed Mobility: Supine to Sit     Supine to sit: Min assist     General bed mobility comments: Min A to raise trunk  Transfers Overall transfer level: Needs assistance Equipment used: Rolling walker (2 wheeled) Transfers: Sit to/from Stand Sit to Stand: Supervision         General transfer comment: no physical assist needed  Ambulation/Gait Ambulation/Gait assistance: Supervision Ambulation Distance (Feet): 180 Feet Assistive device: Rolling walker (2 wheeled) Gait Pattern/deviations: Step-through pattern;Decreased  stride length   Gait velocity interpretation: at or above normal speed for age/gender General Gait Details: steady with RW, no LOB  Stairs            Wheelchair Mobility    Modified Rankin (Stroke Patients Only)       Balance Overall balance assessment: History of Falls (pt reports having dizziness at rest which has been present since CVA in July, pt reports prior to the CVA she had vertigo for which meclicize helped, since CVA in July meclizine does not help with dizziness)                                           Pertinent Vitals/Pain Pain Assessment: No/denies pain    Home Living Family/patient expects to be discharged to:: Private residence Living Arrangements: Spouse/significant other Available Help at Discharge: Family;Available 24 hours/day Type of Home: Apartment Home Access: Level entry     Home Layout: One level Home Equipment: Walker - 2 wheels;Shower seat Additional Comments: Husband does all cooking, cleaning and laundry as well as grocery shopping.     Prior Function Level of Independence: Independent         Comments: Does not drive.  Does not use RW.  Working with HHPT for balance.     Hand Dominance   Dominant Hand: Right    Extremity/Trunk Assessment   Upper Extremity Assessment: Overall WFL for tasks assessed           Lower Extremity Assessment: Overall WFL for tasks assessed  Cervical / Trunk Assessment: Normal  Communication   Communication: No difficulties  Cognition Arousal/Alertness: Awake/alert Behavior During Therapy: WFL for tasks assessed/performed Overall Cognitive Status: Within Functional Limits for tasks assessed                      General Comments      Exercises     Assessment/Plan    PT Assessment All further PT needs can be met in the next venue of care  PT Problem List Decreased balance;Decreased activity tolerance          PT Treatment Interventions      PT  Goals (Current goals can be found in the Care Plan section)  Acute Rehab PT Goals Patient Stated Goal: decrease dizziness PT Goal Formulation: All assessment and education complete, DC therapy    Frequency     Barriers to discharge        Co-evaluation               End of Session Equipment Utilized During Treatment: Gait belt Activity Tolerance: Patient limited by fatigue Patient left: in bed;with call bell/phone within reach;with bed alarm set Nurse Communication: Mobility status         Time: 1455-1520 PT Time Calculation (min) (ACUTE ONLY): 25 min   Charges:   PT Evaluation $PT Eval Low Complexity: 1 Procedure PT Treatments $Gait Training: 8-22 mins   PT G Codes:        Philomena Doheny 08/30/2016, 3:44 PM

## 2016-08-30 NOTE — Progress Notes (Signed)
Discharge instructions given to pt, verbalized understanding. Left the unit in stable condition. 

## 2016-08-30 NOTE — Progress Notes (Signed)
PT Cancellation Note  Patient Details Name: Cassandra [name] MRN: [id number] DOB: 1946-05-12   Cancelled Treatment:    Reason Eval/Treat Not Completed: Medical issues which prohibited therapy (pt is still dizzy. Meclazine requested.  Will follow. )   Tamala SerUhlenberg, Shalla Bulluck Kistler 08/30/2016, 1:46 PM [phone]

## 2016-08-30 NOTE — Discharge Instructions (Signed)
Diabetic Ketoacidosis °Diabetic ketoacidosis is a life-threatening complication of diabetes. If it is not treated, it can cause severe dehydration and organ damage and can lead to a coma or death. °CAUSES °This condition develops when there is not enough of the hormone insulin in the body. Insulin helps the body to break down sugar for energy. Without insulin, the body cannot break down sugar, so it breaks down fats instead. This leads to the production of acids that are called ketones. Ketones are poisonous at high levels. °This condition can be triggered by: °· Stress on the body that is brought on by an illness. °· Medicines that raise blood glucose levels. °· Not taking diabetes medicine. °SYMPTOMS °Symptoms of this condition include: °· Fatigue. °· Weight loss. °· Excessive thirst. °· Light-headedness. °· Fruity or sweet-smelling breath. °· Excessive urination. °· Vision changes. °· Confusion or irritability. °· Nausea. °· Vomiting. °· Rapid breathing. °· Abdominal pain. °· Feeling flushed. °DIAGNOSIS °This condition is diagnosed based on a medical history, a physical exam, and blood tests. You may also have a urine test that checks for ketones. °TREATMENT °This condition may be treated with: °· Fluid replacement. This may be done to correct dehydration. °· Insulin injections. These may be given through the skin or through an IV tube. °· Electrolyte replacement. Electrolytes, such as potassium and sodium, may be given in pill form or through an IV tube. °· Antibiotic medicines. These may be prescribed if your condition was caused by an infection. °HOME CARE INSTRUCTIONS °Eating and Drinking °· Drink enough fluids to keep your urine clear or pale yellow. °· If you cannot eat, alternate between drinking fluids with sugar (such as juice) and salty fluids (such as broth or bouillon). °· If you can eat, follow your usual diet and drink sugar-free liquids, such as water. °Other Instructions °· Take insulin as  directed by your health care provider. Do not skip insulin injections. Do not use expired insulin. °· If your blood sugar is over 240 mg/dL, monitor your urine ketones every 4-6 hours. °· If you were prescribed an antibiotic medicine, finish all of it even if you start to feel better. °· Rest and exercise only as directed by your health care provider. °· If you get sick, call your health care provider and begin treatment quickly. Your body often needs extra insulin to fight an illness. °· Check your blood glucose levels regularly. If your blood glucose is high, drink plenty of fluids. This helps to flush out ketones. °SEEK MEDICAL CARE IF: °· Your blood glucose level is too high or too low. °· You have ketones in your urine. °· You have a fever. °· You cannot eat. °· You cannot tolerate fluids. °· You have been vomiting for more than 2 hours. °· You continue to have symptoms of this condition. °· You develop new symptoms. °SEEK IMMEDIATE MEDICAL CARE IF: °· Your blood glucose levels continue to be high (elevated). °· Your monitor reads "high" even when you are taking insulin. °· You faint. °· You have chest pain. °· You have trouble breathing. °· You have a sudden, severe headache. °· You have sudden weakness in one arm or one leg. °· You have sudden trouble speaking or swallowing. °· You have vomiting or diarrhea that gets worse after 3 hours. °· You feel severely fatigued. °· You have trouble thinking. °· You have abdominal pain. °· You are severely dehydrated. Symptoms of severe dehydration include: °¨ Extreme thirst. °¨ Dry mouth. °¨ Blue lips. °¨   Cold hands and feet. °¨ Rapid breathing. °  °This information is not intended to replace advice given to you by your health care provider. Make sure you discuss any questions you have with your health care provider. °  °Document Released: 11/29/2000 Document Revised: 04/18/2015 Document Reviewed: 11/09/2014 °[employer] Interactive Patient Education ©2016 [employer] ° °

## 2016-09-02 ENCOUNTER — Ambulatory Visit: Payer: Medicare Other | Admitting: Nurse Practitioner

## 2016-09-06 ENCOUNTER — Emergency Department (HOSPITAL_COMMUNITY): Payer: Medicare Other

## 2016-09-06 ENCOUNTER — Encounter (HOSPITAL_COMMUNITY): Payer: Self-pay

## 2016-09-06 ENCOUNTER — Inpatient Hospital Stay (HOSPITAL_COMMUNITY)
Admission: EM | Admit: 2016-09-06 | Discharge: 2016-09-09 | DRG: 638 | Disposition: A | Discharge status: Home / Self Care | Payer: Medicare Other | Attending: Internal Medicine | Admitting: Internal Medicine

## 2016-09-06 DIAGNOSIS — Z8249 Family history of ischemic heart disease and other diseases of the circulatory system: Secondary | ICD-10-CM

## 2016-09-06 DIAGNOSIS — N183 Chronic kidney disease, stage 3 (moderate): Secondary | ICD-10-CM | POA: Diagnosis present

## 2016-09-06 DIAGNOSIS — Z794 Long term (current) use of insulin: Secondary | ICD-10-CM | POA: Diagnosis not present

## 2016-09-06 DIAGNOSIS — I130 Hypertensive heart and chronic kidney disease with heart failure and stage 1 through stage 4 chronic kidney disease, or unspecified chronic kidney disease: Secondary | ICD-10-CM | POA: Diagnosis present

## 2016-09-06 DIAGNOSIS — Z8673 Personal history of transient ischemic attack (TIA), and cerebral infarction without residual deficits: Secondary | ICD-10-CM

## 2016-09-06 DIAGNOSIS — Y92009 Unspecified place in unspecified non-institutional (private) residence as the place of occurrence of the external cause: Secondary | ICD-10-CM

## 2016-09-06 DIAGNOSIS — E871 Hypo-osmolality and hyponatremia: Secondary | ICD-10-CM | POA: Diagnosis present

## 2016-09-06 DIAGNOSIS — D638 Anemia in other chronic diseases classified elsewhere: Secondary | ICD-10-CM | POA: Diagnosis present

## 2016-09-06 DIAGNOSIS — W19XXXA Unspecified fall, initial encounter: Secondary | ICD-10-CM | POA: Diagnosis present

## 2016-09-06 DIAGNOSIS — E1010 Type 1 diabetes mellitus with ketoacidosis without coma: Principal | ICD-10-CM | POA: Diagnosis present

## 2016-09-06 DIAGNOSIS — E039 Hypothyroidism, unspecified: Secondary | ICD-10-CM | POA: Diagnosis present

## 2016-09-06 DIAGNOSIS — E1022 Type 1 diabetes mellitus with diabetic chronic kidney disease: Secondary | ICD-10-CM | POA: Diagnosis present

## 2016-09-06 DIAGNOSIS — I10 Essential (primary) hypertension: Secondary | ICD-10-CM | POA: Diagnosis not present

## 2016-09-06 DIAGNOSIS — R739 Hyperglycemia, unspecified: Secondary | ICD-10-CM | POA: Diagnosis not present

## 2016-09-06 DIAGNOSIS — Z888 Allergy status to other drugs, medicaments and biological substances status: Secondary | ICD-10-CM | POA: Diagnosis not present

## 2016-09-06 DIAGNOSIS — N179 Acute kidney failure, unspecified: Secondary | ICD-10-CM | POA: Diagnosis present

## 2016-09-06 DIAGNOSIS — R42 Dizziness and giddiness: Secondary | ICD-10-CM | POA: Diagnosis present

## 2016-09-06 DIAGNOSIS — I5032 Chronic diastolic (congestive) heart failure: Secondary | ICD-10-CM | POA: Diagnosis present

## 2016-09-06 DIAGNOSIS — E876 Hypokalemia: Secondary | ICD-10-CM | POA: Diagnosis present

## 2016-09-06 DIAGNOSIS — F17210 Nicotine dependence, cigarettes, uncomplicated: Secondary | ICD-10-CM | POA: Diagnosis present

## 2016-09-06 DIAGNOSIS — E0810 Diabetes mellitus due to underlying condition with ketoacidosis without coma: Secondary | ICD-10-CM

## 2016-09-06 DIAGNOSIS — E860 Dehydration: Secondary | ICD-10-CM | POA: Diagnosis present

## 2016-09-06 DIAGNOSIS — E1043 Type 1 diabetes mellitus with diabetic autonomic (poly)neuropathy: Secondary | ICD-10-CM | POA: Diagnosis present

## 2016-09-06 DIAGNOSIS — Z7982 Long term (current) use of aspirin: Secondary | ICD-10-CM

## 2016-09-06 DIAGNOSIS — F1010 Alcohol abuse, uncomplicated: Secondary | ICD-10-CM | POA: Diagnosis present

## 2016-09-06 DIAGNOSIS — R110 Nausea: Secondary | ICD-10-CM | POA: Diagnosis present

## 2016-09-06 DIAGNOSIS — E038 Other specified hypothyroidism: Secondary | ICD-10-CM | POA: Diagnosis not present

## 2016-09-06 HISTORY — DX: Heart failure, unspecified: I50.9

## 2016-09-06 LAB — BASIC METABOLIC PANEL
Anion gap: 9 (ref 5–15)
BUN: 26 mg/dL — AB (ref 6–20)
CHLORIDE: 105 mmol/L (ref 101–111)
CO2: 22 mmol/L (ref 22–32)
CREATININE: 1.75 mg/dL — AB (ref 0.44–1.00)
Calcium: 8.1 mg/dL — ABNORMAL LOW (ref 8.9–10.3)
GFR, EST AFRICAN AMERICAN: 33 mL/min — AB (ref >60)
GFR, EST NON AFRICAN AMERICAN: 29 mL/min — AB (ref >60)
Glucose, Bld: 146 mg/dL — ABNORMAL HIGH (ref 65–99)
POTASSIUM: 4 mmol/L (ref 3.5–5.1)
SODIUM: 136 mmol/L (ref 135–145)

## 2016-09-06 LAB — CBC
HCT: 30.5 % — ABNORMAL LOW (ref 36.0–46.0)
HEMATOCRIT: 25.8 % — AB (ref 36.0–46.0)
Hemoglobin: 10.5 g/dL — ABNORMAL LOW (ref 12.0–15.0)
Hemoglobin: 9 g/dL — ABNORMAL LOW (ref 12.0–15.0)
MCH: 31 pg (ref 26.0–34.0)
MCH: 31.1 pg (ref 26.0–34.0)
MCHC: 34.4 g/dL (ref 30.0–36.0)
MCHC: 34.9 g/dL (ref 30.0–36.0)
MCV: 90 fL (ref 78.0–100.0)
MCV: 89.3 fL (ref 78.0–100.0)
PLATELETS: 350 10*3/uL (ref 150–400)
PLATELETS: 292 10*3/uL (ref 150–400)
RBC: 3.39 MIL/uL — ABNORMAL LOW (ref 3.87–5.11)
RBC: 2.89 MIL/uL — AB (ref 3.87–5.11)
RDW: 13.3 % (ref 11.5–15.5)
RDW: 13.4 % (ref 11.5–15.5)
WBC: 10 10*3/uL (ref 4.0–10.5)
WBC: 10.6 10*3/uL — AB (ref 4.0–10.5)

## 2016-09-06 LAB — COMPREHENSIVE METABOLIC PANEL
ALT: 34 U/L (ref 14–54)
ANION GAP: 19 — AB (ref 5–15)
AST: 54 U/L — AB (ref 15–41)
Albumin: 3.2 g/dL — ABNORMAL LOW (ref 3.5–5.0)
Alkaline Phosphatase: 81 U/L (ref 38–126)
BILIRUBIN TOTAL: 1.2 mg/dL (ref 0.3–1.2)
BUN: 26 mg/dL — AB (ref 6–20)
CHLORIDE: 98 mmol/L — AB (ref 101–111)
CO2: 15 mmol/L — ABNORMAL LOW (ref 22–32)
Calcium: 8.7 mg/dL — ABNORMAL LOW (ref 8.9–10.3)
Creatinine, Ser: 2.07 mg/dL — ABNORMAL HIGH (ref 0.44–1.00)
GFR, EST AFRICAN AMERICAN: 27 mL/min — AB (ref >60)
GFR, EST NON AFRICAN AMERICAN: 23 mL/min — AB (ref >60)
Glucose, Bld: 516 mg/dL (ref 65–99)
POTASSIUM: 3.8 mmol/L (ref 3.5–5.1)
Sodium: 132 mmol/L — ABNORMAL LOW (ref 135–145)
TOTAL PROTEIN: 6.4 g/dL — AB (ref 6.5–8.1)

## 2016-09-06 LAB — CBG MONITORING, ED
GLUCOSE-CAPILLARY: 546 mg/dL — AB (ref 65–99)
GLUCOSE-CAPILLARY: 340 mg/dL — AB (ref 65–99)
GLUCOSE-CAPILLARY: 174 mg/dL — AB (ref 65–99)
Glucose-Capillary: 248 mg/dL — ABNORMAL HIGH (ref 65–99)

## 2016-09-06 LAB — BLOOD GAS, VENOUS
ACID-BASE DEFICIT: 11.3 mmol/L — AB (ref 0.0–2.0)
BICARBONATE: 14.8 mmol/L — AB (ref 20.0–28.0)
O2 Saturation: 62 %
PATIENT TEMPERATURE: 98.6
PH VEN: 7.241 — AB (ref 7.250–7.430)
pCO2, Ven: 35.7 mmHg — ABNORMAL LOW (ref 44.0–60.0)
pO2, Ven: 35 mmHg (ref 32.0–45.0)

## 2016-09-06 LAB — GLUCOSE, CAPILLARY
GLUCOSE-CAPILLARY: 135 mg/dL — AB (ref 65–99)
GLUCOSE-CAPILLARY: 143 mg/dL — AB (ref 65–99)
GLUCOSE-CAPILLARY: 73 mg/dL (ref 65–99)
Glucose-Capillary: 142 mg/dL — ABNORMAL HIGH (ref 65–99)
Glucose-Capillary: 116 mg/dL — ABNORMAL HIGH (ref 65–99)

## 2016-09-06 LAB — LIPASE, BLOOD: LIPASE: 22 U/L (ref 11–51)

## 2016-09-06 LAB — BRAIN NATRIURETIC PEPTIDE: B Natriuretic Peptide: 187.2 pg/mL — ABNORMAL HIGH (ref 0.0–100.0)

## 2016-09-06 LAB — I-STAT TROPONIN, ED: TROPONIN I, POC: 0.01 ng/mL (ref 0.00–0.08)

## 2016-09-06 LAB — ETHANOL: Alcohol, Ethyl (B): <5 mg/dL (ref <5)

## 2016-09-06 MED ORDER — AMLODIPINE BESYLATE 5 MG PO TABS
5.0000 mg | ORAL_TABLET | Freq: Every day | ORAL | Status: DC
  Administered 2016-09-07 – 2016-09-09 (×3): 5 mg via ORAL
  Filled 2016-09-06 (×3): qty 1

## 2016-09-06 MED ORDER — MECLIZINE HCL 25 MG PO TABS
25.0000 mg | ORAL_TABLET | Freq: Three times a day (TID) | ORAL | Status: DC | PRN
  Administered 2016-09-08 – 2016-09-09 (×2): 25 mg via ORAL
  Filled 2016-09-06 (×3): qty 1

## 2016-09-06 MED ORDER — LEVOTHYROXINE SODIUM 88 MCG PO TABS
88.0000 ug | ORAL_TABLET | Freq: Every day | ORAL | Status: DC
  Administered 2016-09-07 – 2016-09-09 (×3): 88 ug via ORAL
  Filled 2016-09-06 (×3): qty 1

## 2016-09-06 MED ORDER — INSULIN REGULAR HUMAN 100 UNIT/ML IJ SOLN
INTRAMUSCULAR | Status: DC
  Administered 2016-09-06: 1.1 [IU]/h via INTRAVENOUS

## 2016-09-06 MED ORDER — INSULIN ASPART 100 UNIT/ML ~~LOC~~ SOLN
0.0000 [IU] | Freq: Three times a day (TID) | SUBCUTANEOUS | Status: DC
  Administered 2016-09-07: 12 [IU] via SUBCUTANEOUS
  Administered 2016-09-07: 2 [IU] via SUBCUTANEOUS

## 2016-09-06 MED ORDER — SODIUM CHLORIDE 0.9 % IV SOLN
INTRAVENOUS | Status: DC
  Administered 2016-09-06: 2.8 [IU]/h via INTRAVENOUS
  Filled 2016-09-06: qty 2.5

## 2016-09-06 MED ORDER — CLOPIDOGREL BISULFATE 75 MG PO TABS
75.0000 mg | ORAL_TABLET | Freq: Every day | ORAL | Status: DC
  Administered 2016-09-07 – 2016-09-09 (×3): 75 mg via ORAL
  Filled 2016-09-06 (×3): qty 1

## 2016-09-06 MED ORDER — INSULIN DETEMIR 100 UNIT/ML ~~LOC~~ SOLN
10.0000 [IU] | Freq: Once | SUBCUTANEOUS | Status: AC
  Administered 2016-09-06: 10 [IU] via SUBCUTANEOUS
  Filled 2016-09-06: qty 0.1

## 2016-09-06 MED ORDER — METOCLOPRAMIDE HCL 5 MG/ML IJ SOLN
10.0000 mg | Freq: Three times a day (TID) | INTRAMUSCULAR | Status: DC
  Administered 2016-09-06 – 2016-09-08 (×6): 10 mg via INTRAVENOUS
  Filled 2016-09-06 (×6): qty 2

## 2016-09-06 MED ORDER — INSULIN DETEMIR 100 UNIT/ML ~~LOC~~ SOLN: 10.0000 [IU] | Freq: Every day | SUBCUTANEOUS | Status: DC

## 2016-09-06 MED ORDER — HYDRALAZINE HCL 20 MG/ML IJ SOLN: 5.0000 mg | INTRAMUSCULAR | Status: DC | PRN

## 2016-09-06 MED ORDER — DEXTROSE-NACL 5-0.45 % IV SOLN
INTRAVENOUS | Status: DC
  Administered 2016-09-06: 17:00:00 via INTRAVENOUS

## 2016-09-06 MED ORDER — DEXTROSE-NACL 5-0.45 % IV SOLN: INTRAVENOUS | Status: DC

## 2016-09-06 MED ORDER — KCL IN DEXTROSE-NACL 20-5-0.45 MEQ/L-%-% IV SOLN
INTRAVENOUS | Status: DC
  Administered 2016-09-06: 16:00:00 via INTRAVENOUS
  Filled 2016-09-06: qty 1000

## 2016-09-06 MED ORDER — POLYSACCHARIDE IRON COMPLEX 150 MG PO CAPS
150.0000 mg | ORAL_CAPSULE | Freq: Two times a day (BID) | ORAL | Status: DC
  Administered 2016-09-07 – 2016-09-09 (×5): 150 mg via ORAL
  Filled 2016-09-06 (×5): qty 1

## 2016-09-06 MED ORDER — ENOXAPARIN SODIUM 30 MG/0.3ML ~~LOC~~ SOLN
30.0000 mg | SUBCUTANEOUS | Status: DC
  Administered 2016-09-06 – 2016-09-08 (×3): 30 mg via SUBCUTANEOUS
  Filled 2016-09-06 (×4): qty 0.3

## 2016-09-06 MED ORDER — DIAZEPAM 5 MG PO TABS
5.0000 mg | ORAL_TABLET | Freq: Two times a day (BID) | ORAL | Status: DC | PRN
  Administered 2016-09-07 – 2016-09-08 (×2): 5 mg via ORAL
  Filled 2016-09-06 (×2): qty 1

## 2016-09-06 MED ORDER — ATORVASTATIN CALCIUM 10 MG PO TABS
10.0000 mg | ORAL_TABLET | Freq: Every day | ORAL | Status: DC
  Administered 2016-09-07 – 2016-09-08 (×2): 10 mg via ORAL
  Filled 2016-09-06 (×2): qty 1

## 2016-09-06 MED ORDER — SODIUM CHLORIDE 0.9 % IV BOLUS (SEPSIS)
1000.0000 mL | Freq: Once | INTRAVENOUS | Status: AC
Start: 2016-09-06 — End: 2016-09-06
  Administered 2016-09-06: 1000 mL via INTRAVENOUS

## 2016-09-06 MED ORDER — ONDANSETRON HCL 4 MG/2ML IJ SOLN
4.0000 mg | Freq: Four times a day (QID) | INTRAMUSCULAR | Status: DC | PRN
Start: 2016-09-06 — End: 2016-09-09
  Administered 2016-09-08: 4 mg via INTRAVENOUS
  Filled 2016-09-06: qty 2

## 2016-09-06 MED ORDER — POTASSIUM CHLORIDE IN NACL 20-0.9 MEQ/L-% IV SOLN
Freq: Once | INTRAVENOUS | Status: DC
  Filled 2016-09-06: qty 1000

## 2016-09-06 MED ORDER — ASPIRIN EC 325 MG PO TBEC
325.0000 mg | DELAYED_RELEASE_TABLET | Freq: Every day | ORAL | Status: DC
  Administered 2016-09-07 – 2016-09-09 (×3): 325 mg via ORAL
  Filled 2016-09-06 (×3): qty 1

## 2016-09-06 MED ORDER — SODIUM CHLORIDE 0.9 % IV SOLN: INTRAVENOUS | Status: DC

## 2016-09-06 MED ORDER — POTASSIUM CHLORIDE 10 MEQ/100ML IV SOLN
10.0000 meq | INTRAVENOUS | Status: AC
  Administered 2016-09-06 (×2): 10 meq via INTRAVENOUS
  Filled 2016-09-06 (×2): qty 100

## 2016-09-06 MED ORDER — ESCITALOPRAM OXALATE 20 MG PO TABS
20.0000 mg | ORAL_TABLET | Freq: Every day | ORAL | Status: DC
  Administered 2016-09-07 – 2016-09-09 (×3): 20 mg via ORAL
  Filled 2016-09-06 (×3): qty 1

## 2016-09-06 MED ORDER — CARVEDILOL 3.125 MG PO TABS
3.1250 mg | ORAL_TABLET | Freq: Two times a day (BID) | ORAL | Status: DC
  Administered 2016-09-07 – 2016-09-09 (×5): 3.125 mg via ORAL
  Filled 2016-09-06 (×5): qty 1

## 2016-09-06 NOTE — H&P (Signed)
History and Physical    Cassandra [name] WJX:[id number] DOB: 1946/07/03 DOA: 09/06/2016  Referring MD/NP/PA: Wayna Chalet  PCP: Irving Copas, MD   Patient coming from: Home  Chief Complaint: fatigue, lethargy   HPI: Brief Narrative/ HPI:  70 y.o.femalewith medical history significant of DM1, HTN, CKD3, hypothyroidism, recent admission for DKA in the setting of medical non compliance (hospitalized from 9/11 - 9/15), now presented with persistent nausea and vomiting, poor oral intake. Pt reports she was actually doing well since last discharge until few days ago when she started to vomit and was not able to keep anything down. She denies fevers, chills, no specific sick contacts or exposures. Pt denies blood in vomitus and denies any diarrhea. Pt also reports she fell last night trying to get up but was too weak to support her weight.   In ED, pt noted to be lethargic but easy to awake. Her CBG was > 500 with AG 19. Pt started on insulin drip and TRH asked to admit for further evaluation. VSS. SDU requested.   Assessment & Plan:  Principal Problem: DKA (diabetic ketoacidoses) in DM 1/ brittle diabetes - started on insulin infusion in ED and will continue for now until AG closes - AG on admission 19 - will place on IVF per protocol and monitor BMP Q4 hours until gap closes - pt already ate in ED, monitor CBG's per protocol - also needs K to be supplemented  - A1C ~ 1 month ago was 7.2  Hyponatremia/ hypokalemia  - currently on IVF - supplement K - BMP in AM  Vomiting - secondary to gastroparesis but please note that pt has hx of alcohol use as well  - will ask for EtOH level to be checked, also check lipase level  - place on Zofran as needed, add Reglan as well  - advance diet as pt able to tolerate   Hypertension - monitor in SDU  AKI on CKD (chronic kidney disease) stage 3, GFR 30-59 ml/min - baseline Cr 1.2 - 1.5 - pre renal in etiology due to DKA - IVF  will be provided and will repeat BMP in AM  Dizzy - possibly from dehydration but pt also with known vertigo  - ambulated well on last admission  - [facility] PT ordered on last admission   Hypothyroidism - continue synthroid  Anemiaof chronic disease  - stable with no signs of bleeding    Consultants:  None  Procedures:  None  Antimicrobials:   None   Review of Systems:  Constitutional: Negative for fever, chills, diaphoresis  HENT: Negative for ear pain, nosebleeds, congestion, facial swelling, rhinorrhea, neck pain Eyes: Negative for pain, discharge, redness, itching and visual disturbance.  Respiratory: Negative for cough, choking, chest tightness, wheezing and stridor.   Cardiovascular: Negative for chest pain, palpitations and leg swelling.  Gastrointestinal: Negative for abdominal distention.  Genitourinary: Negative for dysuria, urgency, frequency, hematuria, flank pain, Musculoskeletal: Negative for back pain, joint swelling, arthralgias and gait problem.  Neurological: Negative for dizziness, tremors, seizures, syncope, facial asymmetry Hematological: Negative for adenopathy. Psychiatric/Behavioral: Negative for hallucinations, behavioral problems  Past Medical History:  Diagnosis Date  . Allergic rhinitis   . Anxiety   . Benign hypertension with CKD (chronic kidney disease) stage III   . Benign paroxysmal positional vertigo   . Bipolar disorder (HCC)   . Broken finger   . Broken shoulder   . Broken toes   . Cervicalgia   . CHF (congestive heart failure) (HCC)  07/2016  . CKD (chronic kidney disease) stage 3, GFR 30-59 ml/min   . Depression   . Diabetes mellitus without complication (HCC)   . Elevated liver enzymes Hep B/C neg 2014  . GERD (gastroesophageal reflux disease)   . Glaucoma   . History of alcohol use   . Hypertension   . Hypothyroidism   . Interstitial cystitis    bladder stretched every 9 months  . Migraines   . Psoriasis   . Stroke  (HCC) 05/16/2016   Left occipital and thalamic, right hippocampal  . Thyroid disease   . Tobacco use   . 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 Vitamin B12 deficiency     Past Surgical History:  Procedure Laterality Date  . ABDOMINAL HYSTERECTOMY     with oophorectomy  . APPENDECTOMY    . BLADDER SURGERY    . CATARACT EXTRACTION    . CERVICAL DISC SURGERY    . CHOLECYSTECTOMY    . HERNIA REPAIR    . TONSILLECTOMY     Social Hx:  reports that she has been smoking Cigarettes.  She has been smoking about 0.25 packs per day. She has never used smokeless tobacco. She reports that she does not drink alcohol or use drugs.  Allergies  Allergen Reactions  . Ciprofloxacin Hives  . Hydrocodone Nausea And Vomiting  . Codeine Diarrhea and Nausea And Vomiting  . Doxycycline Diarrhea and Nausea And Vomiting  . Omnicef [Cefdinir] Nausea Only and Other (See Comments)    Constipation, tolerated Zosyn  . Augmentin [Amoxicillin-Pot Clavulanate] Hives and Rash    Has patient had a PCN reaction causing immediate rash, facial/tongue/throat swelling, SOB or lightheadedness with hypotension: Yes Has patient had a PCN reaction causing severe rash involving mucus membranes or skin necrosis: Yes Did a PCN reaction that required hospitalization No Did PCN reaction occurring within the last 10 years: Yes If all of the above answers are "NO", then may proceed with Cephalosporin use.  Pt states she has taken penicillin since, and was ok with it...    Family History  Problem Relation Age of Onset  . Alcohol abuse Mother   . Arthritis Mother   . Asthma Mother   . Cancer Mother     colon cancer  . Hypertension Mother   . Migraines Mother   . Stroke Mother   . Lung disease Mother   . COPD Mother   . Diabetes Father   . Hypertension Father   . Heart disease Father   . Heart attack Father   . Heart disease Paternal Grandmother   . Diabetes Paternal Grandmother   . Stroke  Paternal Grandmother   . Cancer Paternal Grandmother   . Diabetes Paternal Grandfather     Prior to Admission medications   Medication Sig Start Date End Date Taking? Authorizing Provider  amLODipine (NORVASC) 10 MG tablet Take 10 mg by mouth daily.   Yes Historical Provider, MD  aspirin EC 325 MG tablet Take 325 mg by mouth daily.   Yes Historical Provider, MD  atorvastatin (LIPITOR) 10 MG tablet Take 1 tablet (10 mg total) by mouth daily at 6 PM. 05/19/16  Yes Rolly Salter, MD  carvedilol (COREG) 3.125 MG tablet Take 1 tablet (3.125 mg total) by mouth 2 (two) times daily with a meal. 07/18/16  Yes Mauricio Annett Gula, MD  diazepam (VALIUM) 5 MG tablet Take 1 tablet (5 mg total) by mouth every 12 (twelve) hours as needed  for anxiety. 08/30/16  Yes Dorothea OgleIskra M Maddalyn Lutze, MD  insulin lispro (HUMALOG) 100 UNIT/ML injection Inject 3-10 Units into the skin 3 (three) times daily as needed for high blood sugar.    Yes Historical Provider, MD  LORazepam (ATIVAN) 0.5 MG tablet Take 1 tablet (0.5 mg total) by mouth every 8 (eight) hours as needed for anxiety. 05/19/16  Yes Rolly SalterPranav M Patel, MD  losartan (COZAAR) 100 MG tablet Take 100 mg by mouth daily.   Yes Historical Provider, MD  meclizine (ANTIVERT) 25 MG tablet Take 1 tablet (25 mg total) by mouth 3 (three) times daily as needed for dizziness. 02/12/16  Yes Chilton Siiffany Rocky Ripple, MD  polyethylene glycol (MIRALAX / GLYCOLAX) packet Take 17 g by mouth every other day. Patient taking differently: Take 17 g by mouth daily as needed for mild constipation.  05/19/16  Yes Rolly SalterPranav M Patel, MD  escitalopram (LEXAPRO) 10 MG tablet Take 1 tablet (10 mg total) by mouth at bedtime. 08/30/16   Dorothea OgleIskra M Adriene Knipfer, MD  feeding supplement, GLUCERNA SHAKE, (GLUCERNA SHAKE) LIQD Take 237 mLs by mouth 3 (three) times daily between meals. Patient not taking: Reported on 06/22/2016 05/19/16   Rolly SalterPranav M Patel, MD  folic acid (FOLVITE) 1 MG tablet Take 1 tablet (1 mg total) by mouth daily. Patient not  taking: Reported on 07/19/2016 05/19/16   Rolly SalterPranav M Patel, MD  hydrocortisone (ANUSOL-HC) 2.5 % rectal cream Place rectally 4 (four) times daily. 05/19/16   Rolly SalterPranav M Patel, MD  insulin glargine (LANTUS) 100 UNIT/ML injection Inject 0.05 mLs (5 Units total) into the skin daily. 08/30/16   Dorothea OgleIskra M Ka Flammer, MD  iron polysaccharides (NIFEREX) 150 MG capsule Take 1 capsule (150 mg total) by mouth 2 (two) times daily. Patient taking differently: Take 150 mg by mouth daily.  06/28/16   Vassie Lollarlos Madera, MD    Physical Exam: Vitals:   09/06/16 1439 09/06/16 1517 09/06/16 1550 09/06/16 1622  BP: 140/63 142/73 143/71 145/77  Pulse: 78 82 77 78  Resp: 13 18 15 12   Temp:      TempSrc:      SpO2: 95% 100% 96% 96%  Weight:      Height:        Constitutional: NAD, calm, comfortable Vitals:   09/06/16 1439 09/06/16 1517 09/06/16 1550 09/06/16 1622  BP: 140/63 142/73 143/71 145/77  Pulse: 78 82 77 78  Resp: 13 18 15 12   Temp:      TempSrc:      SpO2: 95% 100% 96% 96%  Weight:      Height:       Eyes: PERRL, lids and conjunctivae normal ENMT: Mucous membranes are dry. Posterior pharynx clear of any exudate or lesions.Normal dentition.  Neck: normal, supple, no masses, no thyromegaly Respiratory: clear to auscultation bilaterally, no wheezing, no crackles. Normal respiratory effort.  Cardiovascular: Regular rate and rhythm, no murmurs / rubs / gallops. No extremity edema. 2+ pedal pulses.  Abdomen: no tenderness, no masses palpated. No hepatosplenomegaly. Bowel sounds positive.  Musculoskeletal: no clubbing / cyanosis. No joint deformity upper and lower extremities.  Skin: no rashes, lesions, ulcers. No induration Neurologic: CN 2-12 grossly intact. Sensation intact, DTR normal. Strength 5/5 in all 4.  Psychiatric: Normal mood.   Labs on Admission: I have personally reviewed following labs and imaging studies  CBC:  Recent Labs Lab 09/06/16 1215  WBC 10.0  HGB 10.5*  HCT 30.5*  MCV 90.0  PLT 350    Basic Metabolic Panel:  Recent Labs Lab 09/06/16 1215  NA 132*  K 3.8  CL 98*  CO2 15*  GLUCOSE 516*  BUN 26*  CREATININE 2.07*  CALCIUM 8.7*   Liver Function Tests:  Recent Labs Lab 09/06/16 1215  AST 54*  ALT 34  ALKPHOS 81  BILITOT 1.2  PROT 6.4*  ALBUMIN 3.2*  CBG:  Recent Labs Lab 09/06/16 1137 09/06/16 1349 09/06/16 1459 09/06/16 1620  GLUCAP 546* 340* 248* 174*   Urine analysis:    Component Value Date/Time   COLORURINE YELLOW 08/26/2016 1530   APPEARANCEUR CLOUDY (A) 08/26/2016 1530   APPEARANCEUR Clear 06/15/2014 1508   LABSPEC 1.026 08/26/2016 1530   LABSPEC 1.010 06/15/2014 1508   PHURINE 5.0 08/26/2016 1530   GLUCOSEU >1000 (A) 08/26/2016 1530   GLUCOSEU Negative 06/15/2014 1508   HGBUR TRACE (A) 08/26/2016 1530   BILIRUBINUR NEGATIVE 08/26/2016 1530   BILIRUBINUR Negative 06/15/2014 1508   KETONESUR 40 (A) 08/26/2016 1530   PROTEINUR 100 (A) 08/26/2016 1530   UROBILINOGEN 0.2 09/14/2015 1617   NITRITE NEGATIVE 08/26/2016 1530   LEUKOCYTESUR NEGATIVE 08/26/2016 1530   LEUKOCYTESUR Negative 06/15/2014 1508   Radiological Exams on Admission: Dg Chest 2 View  Result Date: 09/06/2016 CLINICAL DATA:  Fall. EXAM: CHEST  2 VIEW COMPARISON:  07/11/2016. FINDINGS: Mediastinum and hilar structures normal. Lungs are clear. No pleural effusion or pneumothorax. Heart size normal. Degenerative changes thoracic spine. Stable mild thoracic spine compressions noted. IMPRESSION: No acute cardiopulmonary disease. Electronically Signed   By: Maisie Fus  Register   On: 09/06/2016 13:05   Ct Head Wo Contrast  Result Date: 09/06/2016 CLINICAL DATA:  N/V, 4 episodes of vomit this am, pt has right periorbital bruising, states that she had a dizzy spell and tell last night, no LOC, reports that she did have some vomiting yesterday in addition to the 4 episodes this am, pt is a type 1 diabetic, pt took bld sugar this am and meter registered "high"- pt took 10 units of  Humalog at 9 am, per EMS it was 577 en route EXAM: CT HEAD WITHOUT CONTRAST CT MAXILLOFACIAL WITHOUT CONTRAST CT CERVICAL SPINE WITHOUT CONTRAST TECHNIQUE: Multidetector CT imaging of the head, cervical spine, and maxillofacial structures were performed using the standard protocol without intravenous contrast. Multiplanar CT image reconstructions of the cervical spine and maxillofacial structures were also generated. COMPARISON:  07/11/2016 FINDINGS: CT HEAD FINDINGS Brain: Diffuse parenchymal atrophy. Patchy areas of hypoattenuation in deep and periventricular white matter bilaterally. Negative for acute intracranial hemorrhage, mass lesion, acute infarction, midline shift, or mass-effect. Acute infarct may be inapparent on noncontrast CT. Ventricles and sulci symmetric. Vascular: No hyperdense vessel or unexpected calcification. Skull: Normal. Negative for fracture or focal lesion. Sinuses/Orbits: Frontal sinuses hypoplastic.  No acute finding. Other: CT MAXILLOFACIAL FINDINGS Osseous: No fracture or mandibular dislocation. No destructive process. Orbits: Negative. No traumatic or inflammatory finding. Sinuses: Hypoplastic frontal sinuses. Remainder are normally developed and well aerated. Soft tissues: Negative Limited intracranial: No significant or unexpected finding. CT CERVICAL SPINE FINDINGS Alignment: Loss of the normal lordosis. Skull base and vertebrae: Asymmetric facet DJD and spurring left C2-3, right C3-4 and C4-5 with early foraminal encroachment at these levels. Previous instrumented ACDF C5-6, hardware intact without surrounding lucency, solid-appearing fusion across the interspace. Exuberant anterior bridging osteophytes C7-T1. Soft tissues and spinal canal: Posterior partially calcified protrusions C2-3 and C6-7. Disc levels: Solid fusion across the C5-6 level. There is mild narrowing C6-7. Disc heights well preserved elsewhere. Upper chest: Negative. Other: Bilateral calcified  carotid bifurcation  plaque appear IMPRESSION: 1. Negative for bleed or other acute intracranial process. 2. Mild brain atrophy with nonspecific white matter changes. 3. No maxillofacial fracture or other acute finding. 4. No cervical fracture or other acute finding. 5. Degenerative and postop changes in the cervical spine as above Electronically Signed   By: Corlis Leak M.D.   On: 09/06/2016 13:16   Ct Cervical Spine Wo Contrast  Result Date: 09/06/2016 CLINICAL DATA:  N/V, 4 episodes of vomit this am, pt has right periorbital bruising, states that she had a dizzy spell and tell last night, no LOC, reports that she did have some vomiting yesterday in addition to the 4 episodes this am, pt is a type 1 diabetic, pt took bld sugar this am and meter registered "high"- pt took 10 units of Humalog at 9 am, per EMS it was 577 en route EXAM: CT HEAD WITHOUT CONTRAST CT MAXILLOFACIAL WITHOUT CONTRAST CT CERVICAL SPINE WITHOUT CONTRAST TECHNIQUE: Multidetector CT imaging of the head, cervical spine, and maxillofacial structures were performed using the standard protocol without intravenous contrast. Multiplanar CT image reconstructions of the cervical spine and maxillofacial structures were also generated. COMPARISON:  07/11/2016 FINDINGS: CT HEAD FINDINGS Brain: Diffuse parenchymal atrophy. Patchy areas of hypoattenuation in deep and periventricular white matter bilaterally. Negative for acute intracranial hemorrhage, mass lesion, acute infarction, midline shift, or mass-effect. Acute infarct may be inapparent on noncontrast CT. Ventricles and sulci symmetric. Vascular: No hyperdense vessel or unexpected calcification. Skull: Normal. Negative for fracture or focal lesion. Sinuses/Orbits: Frontal sinuses hypoplastic.  No acute finding. Other: CT MAXILLOFACIAL FINDINGS Osseous: No fracture or mandibular dislocation. No destructive process. Orbits: Negative. No traumatic or inflammatory finding. Sinuses: Hypoplastic frontal sinuses. Remainder  are normally developed and well aerated. Soft tissues: Negative Limited intracranial: No significant or unexpected finding. CT CERVICAL SPINE FINDINGS Alignment: Loss of the normal lordosis. Skull base and vertebrae: Asymmetric facet DJD and spurring left C2-3, right C3-4 and C4-5 with early foraminal encroachment at these levels. Previous instrumented ACDF C5-6, hardware intact without surrounding lucency, solid-appearing fusion across the interspace. Exuberant anterior bridging osteophytes C7-T1. Soft tissues and spinal canal: Posterior partially calcified protrusions C2-3 and C6-7. Disc levels: Solid fusion across the C5-6 level. There is mild narrowing C6-7. Disc heights well preserved elsewhere. Upper chest: Negative. Other: Bilateral calcified carotid bifurcation plaque appear IMPRESSION: 1. Negative for bleed or other acute intracranial process. 2. Mild brain atrophy with nonspecific white matter changes. 3. No maxillofacial fracture or other acute finding. 4. No cervical fracture or other acute finding. 5. Degenerative and postop changes in the cervical spine as above Electronically Signed   By: Corlis Leak M.D.   On: 09/06/2016 13:16   Ct Maxillofacial Wo Contrast  Result Date: 09/06/2016 CLINICAL DATA:  N/V, 4 episodes of vomit this am, pt has right periorbital bruising, states that she had a dizzy spell and tell last night, no LOC, reports that she did have some vomiting yesterday in addition to the 4 episodes this am, pt is a type 1 diabetic, pt took bld sugar this am and meter registered "high"- pt took 10 units of Humalog at 9 am, per EMS it was 577 en route EXAM: CT HEAD WITHOUT CONTRAST CT MAXILLOFACIAL WITHOUT CONTRAST CT CERVICAL SPINE WITHOUT CONTRAST TECHNIQUE: Multidetector CT imaging of the head, cervical spine, and maxillofacial structures were performed using the standard protocol without intravenous contrast. Multiplanar CT image reconstructions of the cervical spine and maxillofacial  structures were also generated.  COMPARISON:  07/11/2016 FINDINGS: CT HEAD FINDINGS Brain: Diffuse parenchymal atrophy. Patchy areas of hypoattenuation in deep and periventricular white matter bilaterally. Negative for acute intracranial hemorrhage, mass lesion, acute infarction, midline shift, or mass-effect. Acute infarct may be inapparent on noncontrast CT. Ventricles and sulci symmetric. Vascular: No hyperdense vessel or unexpected calcification. Skull: Normal. Negative for fracture or focal lesion. Sinuses/Orbits: Frontal sinuses hypoplastic.  No acute finding. Other: CT MAXILLOFACIAL FINDINGS Osseous: No fracture or mandibular dislocation. No destructive process. Orbits: Negative. No traumatic or inflammatory finding. Sinuses: Hypoplastic frontal sinuses. Remainder are normally developed and well aerated. Soft tissues: Negative Limited intracranial: No significant or unexpected finding. CT CERVICAL SPINE FINDINGS Alignment: Loss of the normal lordosis. Skull base and vertebrae: Asymmetric facet DJD and spurring left C2-3, right C3-4 and C4-5 with early foraminal encroachment at these levels. Previous instrumented ACDF C5-6, hardware intact without surrounding lucency, solid-appearing fusion across the interspace. Exuberant anterior bridging osteophytes C7-T1. Soft tissues and spinal canal: Posterior partially calcified protrusions C2-3 and C6-7. Disc levels: Solid fusion across the C5-6 level. There is mild narrowing C6-7. Disc heights well preserved elsewhere. Upper chest: Negative. Other: Bilateral calcified carotid bifurcation plaque appear IMPRESSION: 1. Negative for bleed or other acute intracranial process. 2. Mild brain atrophy with nonspecific white matter changes. 3. No maxillofacial fracture or other acute finding. 4. No cervical fracture or other acute finding. 5. Degenerative and postop changes in the cervical spine as above Electronically Signed   By: Corlis Leak M.D.   On: 09/06/2016 13:16     EKG: penidng   Assessment/Plan Active Problems:   DKA, type 1 (HCC)   DVT prophylaxis: Lovenox SQ Code Status: Full  Family Communication: Pt updated at bedside Disposition Plan: Home once off insulin drip  Consults called: None  Admission status: Inpatient   Debbora Presto MD [facility] Pager [phone]  If 7PM-7AM, please contact night-coverage [email or web address] Password [facility]  09/06/2016, 4:28 PM

## 2016-09-06 NOTE — Progress Notes (Signed)
Inpatient Diabetes Program Recommendations  AACE/ADA: New Consensus Statement on Inpatient Glycemic Control (2015)  Target Ranges:  Prepandial:   less than 140 mg/dL      Peak postprandial:   less than 180 mg/dL (1-2 hours)      Critically ill patients:  140 - 180 mg/dL   Results for [facility] (MRN [id number]) as of 09/06/2016 14:58  Ref. Range 09/06/2016 12:15  Sodium Latest Ref Range: 135 - 145 mmol/L 132 (L)  Potassium Latest Ref Range: 3.5 - 5.1 mmol/L 3.8  Chloride Latest Ref Range: 101 - 111 mmol/L 98 (L)  CO2 Latest Ref Range: 22 - 32 mmol/L 15 (L)  BUN Latest Ref Range: 6 - 20 mg/dL 26 (H)  Creatinine Latest Ref Range: 0.44 - 1.00 mg/dL 2.07 (H)  Calcium Latest Ref Range: 8.9 - 10.3 mg/dL 8.7 (L)  EGFR (Non-African Amer.) Latest Ref Range: >60 mL/min 23 (L)  EGFR (African American) Latest Ref Range: >60 mL/min 27 (L)  Glucose Latest Ref Range: 65 - 99 mg/dL 516 (HH)  Anion gap Latest Ref Range: 5 - 15  19 (H)    Admit with: DKA  History: DM  Home DM Meds: Lantus 5 units daily       Humalog 3-10 units tidwc  Current Insulin Orders: IV Insulin drip (started at 2pm)     -Note patient started on IV Insulin drip at 2pm.  -RN to switch IVF to D5 1/2 NS '@100cc'$ /hr once CBG drops below 250 mg/dl.      --Will follow patient during hospitalization--  Wyn Quaker RN, MSN, CDE Diabetes Coordinator Inpatient Glycemic Control Team Team Pager: [phone] (8a-5p)

## 2016-09-06 NOTE — ED Notes (Signed)
Pt returned to room from CT

## 2016-09-06 NOTE — ED Notes (Signed)
Pt arrived via EMS c/o N/V, 4 episodes of vomit this am, pt has right periorbital bruising, states that she had a dizzy spell and tell last night, no LOC, reports that she did have some vomiting yesterday in addition to the 4 episodes this am, pt is a type 1 diabetic, pt took bld sugar this am and meter registered "high"- pt took 10 units of Humalog at 9 am, per EMS it was 577 en route.

## 2016-09-06 NOTE — ED Triage Notes (Signed)
Pt c/o N/V since she woke up this am, EMS took bld sugar and it was 577, no IV access presently, right periorbital bruising from fall last night, denies being on any anticoagulants, pt dosed herself with 10 units Humalog at 9am.

## 2016-09-06 NOTE — ED Provider Notes (Signed)
[facility]-EMERGENCY DEPT Provider Note   CSN: [id number] Arrival date & time: 09/06/16  1100     History   Chief Complaint Chief Complaint  Patient presents with  . Nausea  . Emesis  . Hyperglycemia    HPI Cassandra [name] is a 70 y.o. female.  Cassandra [name] is a 70 y.o. With h/o CKD stage III, BPPV, T1DM, HTN, hypothyroidism, diastolic CHF female presents to ED with complaint of hyperglycemia, N/V. Patient reports she checked her blood sugar this am and it registered "high." She took 10 units of humalog this am. She has associated N/V that started yesterday; 4 episodes of non-bloody emesis today. She denies abdominal pain. She reports shortness of breath, non-productive cough, and b/l lower extremity swelling. Pt reports she was recently diagnosed with diastolic CHF, review of records show EF 60-65% in 7/17. She sleeps at an incline chronically because of PND when lying flat. She denies chest pain. She had URI like sxs with fever last week that have since resolved. She also has bruising noted to her right orbit. Reports she fell last night hitting her head on the headboard. Denies LOC. Denies anti-coagulation therapy. Pt has chronic peripheral numbness. She also has chronic dizziness on meclizine. No changes in vision, new numbness, weakness, facial droop, slurred speech. Denies dysuria or hematuria. No dietary indiscretion. Reports medication compliance.       Past Medical History:  Diagnosis Date  . Allergic rhinitis   . Anxiety   . Benign hypertension with CKD (chronic kidney disease) stage III   . Benign paroxysmal positional vertigo   . Bipolar disorder (HCC)   . Broken finger   . Broken shoulder   . Broken toes   . Cervicalgia   . CHF (congestive heart failure) (HCC) 07/2016  . CKD (chronic kidney disease) stage 3, GFR 30-59 ml/min   . Depression   . Diabetes mellitus without complication (HCC)   . Elevated liver enzymes Hep B/C neg 2014  . GERD (gastroesophageal  reflux disease)   . Glaucoma   . History of alcohol use   . Hypertension   . Hypothyroidism   . Interstitial cystitis    bladder stretched every 9 months  . Migraines   . Psoriasis   . Stroke (HCC) 05/16/2016   Left occipital and thalamic, right hippocampal  . Thyroid disease   . Tobacco use   . 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 Vitamin B12 deficiency     Patient Active Problem List   Diagnosis Date Noted  . DKA, type 1 (HCC) 09/06/2016  . Hypoglycemia associated with diabetes (HCC)   . Orthostatic hypotension   . Protein-calorie malnutrition, severe 08/28/2016  . UTI (lower urinary tract infection) 07/19/2016  . AKI (acute kidney injury) (HCC) 07/19/2016  . Chronic diastolic heart failure (HCC) 07/19/2016  . Dehydration   . Cerebral embolism with cerebral infarction 07/13/2016  . Acute diastolic CHF (congestive heart failure) (HCC) 07/11/2016  . Cerebrovascular accident (CVA) (HCC)   . Essential hypertension   . HLD (hyperlipidemia)   . Anemia of chronic disease   . Physical deconditioning   . Acute renal failure superimposed on stage 3 chronic kidney disease (HCC)   . Diabetic ketoacidosis with coma associated with type 1 diabetes mellitus (HCC)   . History of ETT   . Stroke (HCC) 05/16/2016  . Cerebral thrombosis with cerebral infarction 05/15/2016  . Acute on chronic renal failure (HCC)   . Generalized anxiety disorder  05/10/2016  . DKA (diabetic ketoacidoses) (HCC) 05/06/2016  . Acute respiratory failure (HCC) 05/06/2016  . Acute respiratory failure with hypoxia (HCC) 05/06/2016  . Acute encephalopathy 05/06/2016  . Septic shock (HCC)   . Acute respiratory failure with hypoxia and hypercapnia (HCC)   . 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 Depression   . Hypertension   . CKD (chronic kidney disease) stage 3, GFR 30-59 ml/min   . Hypothyroidism   . Vitamin B12 deficiency   . Benign paroxysmal positional vertigo     . Anxiety   . Allergic rhinitis   . Glaucoma   . Benign hypertension with CKD (chronic kidney disease) stage III   . Tobacco use   . Cervicalgia   . Elevated liver enzymes   . History of alcohol use     Past Surgical History:  Procedure Laterality Date  . ABDOMINAL HYSTERECTOMY     with oophorectomy  . APPENDECTOMY    . BLADDER SURGERY    . CATARACT EXTRACTION    . CERVICAL DISC SURGERY    . CHOLECYSTECTOMY    . HERNIA REPAIR    . TONSILLECTOMY      OB History    No data available       Home Medications    Prior to Admission medications   Medication Sig Start Date End Date Taking? Authorizing Provider  amLODipine (NORVASC) 10 MG tablet Take 10 mg by mouth daily.   Yes Historical Provider, MD  aspirin EC 325 MG tablet Take 325 mg by mouth daily.   Yes Historical Provider, MD  atorvastatin (LIPITOR) 10 MG tablet Take 1 tablet (10 mg total) by mouth daily at 6 PM. 05/19/16  Yes Rolly Salter, MD  carvedilol (COREG) 3.125 MG tablet Take 1 tablet (3.125 mg total) by mouth 2 (two) times daily with a meal. 07/18/16  Yes Mauricio Annett Gula, MD  diazepam (VALIUM) 5 MG tablet Take 1 tablet (5 mg total) by mouth every 12 (twelve) hours as needed for anxiety. 08/30/16  Yes Dorothea Ogle, MD  insulin lispro (HUMALOG) 100 UNIT/ML injection Inject 3-10 Units into the skin 3 (three) times daily as needed for high blood sugar.    Yes Historical Provider, MD  LORazepam (ATIVAN) 0.5 MG tablet Take 1 tablet (0.5 mg total) by mouth every 8 (eight) hours as needed for anxiety. 05/19/16  Yes Rolly Salter, MD  losartan (COZAAR) 100 MG tablet Take 100 mg by mouth daily.   Yes Historical Provider, MD  meclizine (ANTIVERT) 25 MG tablet Take 1 tablet (25 mg total) by mouth 3 (three) times daily as needed for dizziness. 02/12/16  Yes Chilton Si, MD  polyethylene glycol (MIRALAX / GLYCOLAX) packet Take 17 g by mouth every other day. Patient taking differently: Take 17 g by mouth daily as needed  for mild constipation.  05/19/16  Yes Rolly Salter, MD  escitalopram (LEXAPRO) 10 MG tablet Take 1 tablet (10 mg total) by mouth at bedtime. 08/30/16   Dorothea Ogle, MD  feeding supplement, GLUCERNA SHAKE, (GLUCERNA SHAKE) LIQD Take 237 mLs by mouth 3 (three) times daily between meals. Patient not taking: Reported on 06/22/2016 05/19/16   Rolly Salter, MD  folic acid (FOLVITE) 1 MG tablet Take 1 tablet (1 mg total) by mouth daily. Patient not taking: Reported on 07/19/2016 05/19/16   Rolly Salter, MD  hydrocortisone (ANUSOL-HC) 2.5 % rectal cream Place rectally 4 (four) times daily. 05/19/16  Rolly SalterPranav M Patel, MD  insulin glargine (LANTUS) 100 UNIT/ML injection Inject 0.05 mLs (5 Units total) into the skin daily. 08/30/16   Dorothea OgleIskra M Myers, MD  iron polysaccharides (NIFEREX) 150 MG capsule Take 1 capsule (150 mg total) by mouth 2 (two) times daily. Patient taking differently: Take 150 mg by mouth daily.  06/28/16   Vassie Lollarlos Madera, MD    Family History Family History  Problem Relation Age of Onset  . Alcohol abuse Mother   . Arthritis Mother   . Asthma Mother   . Cancer Mother     colon cancer  . Hypertension Mother   . Migraines Mother   . Stroke Mother   . Lung disease Mother   . COPD Mother   . Diabetes Father   . Hypertension Father   . Heart disease Father   . Heart attack Father   . Heart disease Paternal Grandmother   . Diabetes Paternal Grandmother   . Stroke Paternal Grandmother   . Cancer Paternal Grandmother   . Diabetes Paternal Grandfather     Social History Social History  Substance Use Topics  . Smoking status: Current Every Day Smoker    Packs/day: 0.25    Types: Cigarettes  . Smokeless tobacco: Never Used  . Alcohol use No     Comment: Hasn't had any alcohol since last admission     Allergies   Ciprofloxacin; Hydrocodone; Codeine; Doxycycline; Omnicef [cefdinir]; and Augmentin [amoxicillin-pot clavulanate]   Review of Systems Review of Systems    Constitutional: Negative for fever.  HENT: Negative for trouble swallowing.   Eyes: Negative for visual disturbance.  Respiratory: Positive for cough and shortness of breath.   Cardiovascular: Positive for leg swelling. Negative for chest pain.  Gastrointestinal: Positive for constipation, nausea and vomiting. Negative for abdominal pain and diarrhea.  Genitourinary: Negative for dysuria and hematuria.  Musculoskeletal: Negative for neck pain.  Skin: Positive for color change. Negative for rash.  Neurological: Positive for dizziness ( chronic) and numbness ( chronic feet b/l). Negative for syncope, weakness, light-headedness and headaches.     Physical Exam Updated Vital Signs BP 145/90 (BP Location: Left Arm)   Pulse 90   Temp 98 F (36.7 C) (Oral)   Resp 20   Ht 5\' 2"  (1.575 m)   Wt 51.7 kg   SpO2 100%   BMI 20.85 kg/m   Physical Exam  Constitutional: She appears ill ( chronically). No distress.  HENT:  Head: Normocephalic.  Mouth/Throat: Uvula is midline and oropharynx is clear and moist. Mucous membranes are dry. No trismus in the jaw. No oropharyngeal exudate. No tonsillar exudate.  Ecchymosis around right eye  Eyes: Conjunctivae and EOM are normal. Pupils are equal, round, and reactive to light. Right eye exhibits no discharge. Left eye exhibits no discharge. No scleral icterus.  Neck: Normal range of motion and phonation normal. Neck supple. No neck rigidity. Normal range of motion present.  Cardiovascular: Normal rate, regular rhythm, normal heart sounds and intact distal pulses.   No murmur heard. B/l 1+ pitting edema in feet   Pulmonary/Chest: Effort normal and breath sounds normal. No stridor. No respiratory distress. She has no wheezes. She has no rales.  Abdominal: Soft. Bowel sounds are normal. She exhibits no distension. There is no tenderness. There is no rigidity, no rebound, no guarding and no CVA tenderness.  Musculoskeletal: Normal range of motion.   Lymphadenopathy:    She has no cervical adenopathy.  Neurological: She is alert. She is not  disoriented. Coordination and gait normal. GCS eye subscore is 4. GCS verbal subscore is 5. GCS motor subscore is 6.  Skin: Skin is warm and dry. Ecchymosis noted. She is not diaphoretic.     Psychiatric: She has a normal mood and affect. Her behavior is normal.     ED Treatments / Results  Labs (all labs ordered are listed, but only abnormal results are displayed) Labs Reviewed  CBC - Abnormal; Notable for the following:       Result Value   RBC 3.39 (*)    Hemoglobin 10.5 (*)    HCT 30.5 (*)    All other components within normal limits  COMPREHENSIVE METABOLIC PANEL - Abnormal; Notable for the following:    Sodium 132 (*)    Chloride 98 (*)    CO2 15 (*)    Glucose, Bld 516 (*)    BUN 26 (*)    Creatinine, Ser 2.07 (*)    Calcium 8.7 (*)    Total Protein 6.4 (*)    Albumin 3.2 (*)    AST 54 (*)    GFR calc non Af Amer 23 (*)    GFR calc Af Amer 27 (*)    Anion gap 19 (*)    All other components within normal limits  BRAIN NATRIURETIC PEPTIDE - Abnormal; Notable for the following:    B Natriuretic Peptide 187.2 (*)    All other components within normal limits  BLOOD GAS, VENOUS - Abnormal; Notable for the following:    pH, Ven 7.241 (*)    pCO2, Ven 35.7 (*)    Bicarbonate 14.8 (*)    Acid-base deficit 11.3 (*)    All other components within normal limits  CBG MONITORING, ED - Abnormal; Notable for the following:    Glucose-Capillary 546 (*)    All other components within normal limits  CBG MONITORING, ED - Abnormal; Notable for the following:    Glucose-Capillary 340 (*)    All other components within normal limits  CBG MONITORING, ED - Abnormal; Notable for the following:    Glucose-Capillary 248 (*)    All other components within normal limits  URINALYSIS, ROUTINE W REFLEX MICROSCOPIC (NOT AT [facility])  Rosezena Sensor, ED    EKG  EKG Interpretation None        Radiology Dg Chest 2 View  Result Date: 09/06/2016 CLINICAL DATA:  Fall. EXAM: CHEST  2 VIEW COMPARISON:  07/11/2016. FINDINGS: Mediastinum and hilar structures normal. Lungs are clear. No pleural effusion or pneumothorax. Heart size normal. Degenerative changes thoracic spine. Stable mild thoracic spine compressions noted. IMPRESSION: No acute cardiopulmonary disease. Electronically Signed   By: Maisie Fus  Register   On: 09/06/2016 13:05   Ct Head Wo Contrast  Result Date: 09/06/2016 CLINICAL DATA:  N/V, 4 episodes of vomit this am, pt has right periorbital bruising, states that she had a dizzy spell and tell last night, no LOC, reports that she did have some vomiting yesterday in addition to the 4 episodes this am, pt is a type 1 diabetic, pt took bld sugar this am and meter registered "high"- pt took 10 units of Humalog at 9 am, per EMS it was 577 en route EXAM: CT HEAD WITHOUT CONTRAST CT MAXILLOFACIAL WITHOUT CONTRAST CT CERVICAL SPINE WITHOUT CONTRAST TECHNIQUE: Multidetector CT imaging of the head, cervical spine, and maxillofacial structures were performed using the standard protocol without intravenous contrast. Multiplanar CT image reconstructions of the cervical spine and maxillofacial structures were also generated. COMPARISON:  07/11/2016 FINDINGS: CT HEAD FINDINGS Brain: Diffuse parenchymal atrophy. Patchy areas of hypoattenuation in deep and periventricular white matter bilaterally. Negative for acute intracranial hemorrhage, mass lesion, acute infarction, midline shift, or mass-effect. Acute infarct may be inapparent on noncontrast CT. Ventricles and sulci symmetric. Vascular: No hyperdense vessel or unexpected calcification. Skull: Normal. Negative for fracture or focal lesion. Sinuses/Orbits: Frontal sinuses hypoplastic.  No acute finding. Other: CT MAXILLOFACIAL FINDINGS Osseous: No fracture or mandibular dislocation. No destructive process. Orbits: Negative. No traumatic or inflammatory  finding. Sinuses: Hypoplastic frontal sinuses. Remainder are normally developed and well aerated. Soft tissues: Negative Limited intracranial: No significant or unexpected finding. CT CERVICAL SPINE FINDINGS Alignment: Loss of the normal lordosis. Skull base and vertebrae: Asymmetric facet DJD and spurring left C2-3, right C3-4 and C4-5 with early foraminal encroachment at these levels. Previous instrumented ACDF C5-6, hardware intact without surrounding lucency, solid-appearing fusion across the interspace. Exuberant anterior bridging osteophytes C7-T1. Soft tissues and spinal canal: Posterior partially calcified protrusions C2-3 and C6-7. Disc levels: Solid fusion across the C5-6 level. There is mild narrowing C6-7. Disc heights well preserved elsewhere. Upper chest: Negative. Other: Bilateral calcified carotid bifurcation plaque appear IMPRESSION: 1. Negative for bleed or other acute intracranial process. 2. Mild brain atrophy with nonspecific white matter changes. 3. No maxillofacial fracture or other acute finding. 4. No cervical fracture or other acute finding. 5. Degenerative and postop changes in the cervical spine as above Electronically Signed   By: Corlis Leak M.D.   On: 09/06/2016 13:16   Ct Cervical Spine Wo Contrast  Result Date: 09/06/2016 CLINICAL DATA:  N/V, 4 episodes of vomit this am, pt has right periorbital bruising, states that she had a dizzy spell and tell last night, no LOC, reports that she did have some vomiting yesterday in addition to the 4 episodes this am, pt is a type 1 diabetic, pt took bld sugar this am and meter registered "high"- pt took 10 units of Humalog at 9 am, per EMS it was 577 en route EXAM: CT HEAD WITHOUT CONTRAST CT MAXILLOFACIAL WITHOUT CONTRAST CT CERVICAL SPINE WITHOUT CONTRAST TECHNIQUE: Multidetector CT imaging of the head, cervical spine, and maxillofacial structures were performed using the standard protocol without intravenous contrast. Multiplanar CT image  reconstructions of the cervical spine and maxillofacial structures were also generated. COMPARISON:  07/11/2016 FINDINGS: CT HEAD FINDINGS Brain: Diffuse parenchymal atrophy. Patchy areas of hypoattenuation in deep and periventricular white matter bilaterally. Negative for acute intracranial hemorrhage, mass lesion, acute infarction, midline shift, or mass-effect. Acute infarct may be inapparent on noncontrast CT. Ventricles and sulci symmetric. Vascular: No hyperdense vessel or unexpected calcification. Skull: Normal. Negative for fracture or focal lesion. Sinuses/Orbits: Frontal sinuses hypoplastic.  No acute finding. Other: CT MAXILLOFACIAL FINDINGS Osseous: No fracture or mandibular dislocation. No destructive process. Orbits: Negative. No traumatic or inflammatory finding. Sinuses: Hypoplastic frontal sinuses. Remainder are normally developed and well aerated. Soft tissues: Negative Limited intracranial: No significant or unexpected finding. CT CERVICAL SPINE FINDINGS Alignment: Loss of the normal lordosis. Skull base and vertebrae: Asymmetric facet DJD and spurring left C2-3, right C3-4 and C4-5 with early foraminal encroachment at these levels. Previous instrumented ACDF C5-6, hardware intact without surrounding lucency, solid-appearing fusion across the interspace. Exuberant anterior bridging osteophytes C7-T1. Soft tissues and spinal canal: Posterior partially calcified protrusions C2-3 and C6-7. Disc levels: Solid fusion across the C5-6 level. There is mild narrowing C6-7. Disc heights well preserved elsewhere. Upper chest: Negative. Other: Bilateral calcified carotid bifurcation plaque appear  IMPRESSION: 1. Negative for bleed or other acute intracranial process. 2. Mild brain atrophy with nonspecific white matter changes. 3. No maxillofacial fracture or other acute finding. 4. No cervical fracture or other acute finding. 5. Degenerative and postop changes in the cervical spine as above Electronically  Signed   By: Corlis Leak M.D.   On: 09/06/2016 13:16   Ct Maxillofacial Wo Contrast  Result Date: 09/06/2016 CLINICAL DATA:  N/V, 4 episodes of vomit this am, pt has right periorbital bruising, states that she had a dizzy spell and tell last night, no LOC, reports that she did have some vomiting yesterday in addition to the 4 episodes this am, pt is a type 1 diabetic, pt took bld sugar this am and meter registered "high"- pt took 10 units of Humalog at 9 am, per EMS it was 577 en route EXAM: CT HEAD WITHOUT CONTRAST CT MAXILLOFACIAL WITHOUT CONTRAST CT CERVICAL SPINE WITHOUT CONTRAST TECHNIQUE: Multidetector CT imaging of the head, cervical spine, and maxillofacial structures were performed using the standard protocol without intravenous contrast. Multiplanar CT image reconstructions of the cervical spine and maxillofacial structures were also generated. COMPARISON:  07/11/2016 FINDINGS: CT HEAD FINDINGS Brain: Diffuse parenchymal atrophy. Patchy areas of hypoattenuation in deep and periventricular white matter bilaterally. Negative for acute intracranial hemorrhage, mass lesion, acute infarction, midline shift, or mass-effect. Acute infarct may be inapparent on noncontrast CT. Ventricles and sulci symmetric. Vascular: No hyperdense vessel or unexpected calcification. Skull: Normal. Negative for fracture or focal lesion. Sinuses/Orbits: Frontal sinuses hypoplastic.  No acute finding. Other: CT MAXILLOFACIAL FINDINGS Osseous: No fracture or mandibular dislocation. No destructive process. Orbits: Negative. No traumatic or inflammatory finding. Sinuses: Hypoplastic frontal sinuses. Remainder are normally developed and well aerated. Soft tissues: Negative Limited intracranial: No significant or unexpected finding. CT CERVICAL SPINE FINDINGS Alignment: Loss of the normal lordosis. Skull base and vertebrae: Asymmetric facet DJD and spurring left C2-3, right C3-4 and C4-5 with early foraminal encroachment at these  levels. Previous instrumented ACDF C5-6, hardware intact without surrounding lucency, solid-appearing fusion across the interspace. Exuberant anterior bridging osteophytes C7-T1. Soft tissues and spinal canal: Posterior partially calcified protrusions C2-3 and C6-7. Disc levels: Solid fusion across the C5-6 level. There is mild narrowing C6-7. Disc heights well preserved elsewhere. Upper chest: Negative. Other: Bilateral calcified carotid bifurcation plaque appear IMPRESSION: 1. Negative for bleed or other acute intracranial process. 2. Mild brain atrophy with nonspecific white matter changes. 3. No maxillofacial fracture or other acute finding. 4. No cervical fracture or other acute finding. 5. Degenerative and postop changes in the cervical spine as above Electronically Signed   By: Corlis Leak M.D.   On: 09/06/2016 13:16    Procedures Procedures (including critical care time)  Medications Ordered in ED Medications  insulin regular (NOVOLIN R,HUMULIN R) 250 Units in sodium chloride 0.9 % 250 mL (1 Units/mL) infusion (1.9 Units/hr Intravenous Rate/Dose Change 09/06/16 1505)  dextrose 5 % and 0.45 % NaCl with KCl 20 mEq/L infusion (not administered)  0.9 % NaCl with KCl 20 mEq/ L  infusion (not administered)  sodium chloride 0.9 % bolus 1,000 mL (0 mLs Intravenous Stopped 09/06/16 1505)     Initial Impression / Assessment and Plan / ED Course  I have reviewed the triage vital signs and the nursing notes.  Pertinent labs & imaging results that were available during my care of the patient were reviewed by me and considered in my medical decision making (see chart for details).  Clinical Course  Value Comment By Time  DG Chest 2 View Normal cardiac silhouette. No evidence of consolidation, effusion, or PTX. No free air under diaphragm.  Lona Kettle, PA-C 09/22 1520   CT head, neck, maxillofacial reviewed.  Lona Kettle, [place] 09/22 1521    Patient presents to ED with complaint of  hyperglycemia, N/V. Patient is afebrile chronically ill-appreaing in NAD. VSS.  physical exam remarkable for dry mucous membranes. Ecchymosis noted around right eye. 1+ b/l pitting edema in feet. Patient is in DKA with BS 516, AG 19, VBG remarkable for acidosis. AKI present - likely secondary to DKA. Anemia stable. Troponin negative. BNP mildly elevated at 187. CXR negative for acute cardiopulmonary process, no evidence of vascular congestion. CT maxillofacial, head, and neck negative. U/A pending. IVF and insulin initiated. Potassium initiated. Pt in DKA will consult hospitalist for admission. Unclear source at this time for DKA. Discussed pt with Dr. Erma Heritage, agrees with plan  2:49 PM: Spoke with Dr. Izola Price, greatly appreciate her time and input. Agree to admit patient for DKA and AKI  for further management and evaluation.   Final Clinical Impressions(s) / ED Diagnoses   Final diagnoses:  Diabetic ketoacidosis without coma associated with type 1 diabetes mellitus (HCC)  Acute kidney injury [facility])    New Prescriptions New Prescriptions   No medications on file     Lona Kettle, PA-C 09/06/16 1522    Shaune Pollack, MD 09/07/16 1225

## 2016-09-06 NOTE — ED Notes (Signed)
RN to start ultrasound IV

## 2016-09-07 DIAGNOSIS — N179 Acute kidney failure, unspecified: Secondary | ICD-10-CM

## 2016-09-07 DIAGNOSIS — I10 Essential (primary) hypertension: Secondary | ICD-10-CM

## 2016-09-07 LAB — CBC WITH DIFFERENTIAL/PLATELET
BASOS PCT: 0 %
Basophils Absolute: 0.1 10*3/uL (ref 0.0–0.1)
EOS ABS: 0.2 10*3/uL (ref 0.0–0.7)
EOS PCT: 2 %
HCT: 25.2 % — ABNORMAL LOW (ref 36.0–46.0)
HEMOGLOBIN: 9 g/dL — AB (ref 12.0–15.0)
Lymphocytes Relative: 32 %
Lymphs Abs: 3.6 10*3/uL (ref 0.7–4.0)
MCH: 31.6 pg (ref 26.0–34.0)
MCHC: 35.7 g/dL (ref 30.0–36.0)
MCV: 88.4 fL (ref 78.0–100.0)
Monocytes Absolute: 0.7 10*3/uL (ref 0.1–1.0)
Monocytes Relative: 6 %
NEUTROS PCT: 60 %
Neutro Abs: 6.8 10*3/uL (ref 1.7–7.7)
PLATELETS: 299 10*3/uL (ref 150–400)
RBC: 2.85 MIL/uL — AB (ref 3.87–5.11)
RDW: 13.3 % (ref 11.5–15.5)
WBC: 11.4 10*3/uL — AB (ref 4.0–10.5)

## 2016-09-07 LAB — BASIC METABOLIC PANEL
Anion gap: 5 (ref 5–15)
BUN: 21 mg/dL — AB (ref 6–20)
CALCIUM: 7.6 mg/dL — AB (ref 8.9–10.3)
CHLORIDE: 108 mmol/L (ref 101–111)
CO2: 23 mmol/L (ref 22–32)
CREATININE: 1.5 mg/dL — AB (ref 0.44–1.00)
GFR calc non Af Amer: 34 mL/min — ABNORMAL LOW (ref >60)
GFR, EST AFRICAN AMERICAN: 40 mL/min — AB (ref >60)
Glucose, Bld: 45 mg/dL — ABNORMAL LOW (ref 65–99)
Potassium: 3.3 mmol/L — ABNORMAL LOW (ref 3.5–5.1)
SODIUM: 136 mmol/L (ref 135–145)

## 2016-09-07 LAB — GLUCOSE, CAPILLARY
GLUCOSE-CAPILLARY: 124 mg/dL — AB (ref 65–99)
GLUCOSE-CAPILLARY: 156 mg/dL — AB (ref 65–99)
Glucose-Capillary: 114 mg/dL — ABNORMAL HIGH (ref 65–99)
Glucose-Capillary: 116 mg/dL — ABNORMAL HIGH (ref 65–99)
Glucose-Capillary: 166 mg/dL — ABNORMAL HIGH (ref 65–99)
Glucose-Capillary: 285 mg/dL — ABNORMAL HIGH (ref 65–99)
Glucose-Capillary: 33 mg/dL — CL (ref 65–99)
Glucose-Capillary: 66 mg/dL (ref 65–99)

## 2016-09-07 MED ORDER — DEXTROSE 50 % IV SOLN
1.0000 | Freq: Once | INTRAVENOUS | Status: AC
Start: 2016-09-07 — End: 2016-09-07
  Administered 2016-09-07: 50 mL via INTRAVENOUS

## 2016-09-07 MED ORDER — ACETAMINOPHEN 325 MG PO TABS
650.0000 mg | ORAL_TABLET | Freq: Four times a day (QID) | ORAL | Status: DC | PRN
  Administered 2016-09-07: 650 mg via ORAL
  Filled 2016-09-07: qty 2

## 2016-09-07 MED ORDER — DEXTROSE 50 % IV SOLN
INTRAVENOUS | Status: AC
  Filled 2016-09-07: qty 50

## 2016-09-07 MED ORDER — SODIUM CHLORIDE 0.9 % IV SOLN: INTRAVENOUS | Status: DC

## 2016-09-07 MED ORDER — POTASSIUM CHLORIDE CRYS ER 20 MEQ PO TBCR
40.0000 meq | EXTENDED_RELEASE_TABLET | Freq: Once | ORAL | Status: AC
  Administered 2016-09-07: 40 meq via ORAL
  Filled 2016-09-07: qty 2

## 2016-09-07 MED ORDER — DEXTROSE-NACL 5-0.45 % IV SOLN
INTRAVENOUS | Status: DC
  Administered 2016-09-07: 1000 mL via INTRAVENOUS
  Administered 2016-09-07 – 2016-09-08 (×2): via INTRAVENOUS

## 2016-09-07 MED ORDER — DEXTROSE 50 % IV SOLN
1.0000 | Freq: Once | INTRAVENOUS | Status: AC
  Administered 2016-09-07: 50 mL via INTRAVENOUS

## 2016-09-07 NOTE — Progress Notes (Signed)
Inpatient Diabetes Program Recommendations  AACE/ADA: New Consensus Statement on Inpatient Glycemic Control (2015)  Target Ranges:  Prepandial:   less than 140 mg/dL      Peak postprandial:   less than 180 mg/dL (1-2 hours)      Critically ill patients:  140 - 180 mg/dL   Lab Results  Component Value Date   GLUCAP 116 (H) 09/07/2016   HGBA1C 7.2 (H) 07/13/2016    Review of Glycemic Control Results for Cassandra [name], Cassandra [name] (MRN [id number]) as of 09/07/2016 08:09  Ref. Range 09/06/2016 23:03 09/07/2016 00:09 09/07/2016 00:56 09/07/2016 04:17 09/07/2016 06:39  Glucose-Capillary Latest Ref Range: 65 - 99 mg/dL 73 66 409114 (H) 33 (LL) 811116 (H)   Diabetes history: DM Outpatient Diabetes medications: Lantus 5 units daily                                                  Humalog 3-10 units tidwc Current orders for Inpatient glycemic control: Novolog correction 0-24 units tid with meals  Inpatient Diabetes Program Recommendations:  Reviewed CBGs. From past hx, patient is very sensitive to insulin. -Please decrease Novolog correction to sensitive 0-9 tid with meals -Decrease basal insulin to home dose of Lantus 5 units daily -When eating, add 2-3 units Novolog if eats @ least 50%  Thank you, Darel HongJudy E. Kailee Essman, RN, MSN, CDE Inpatient Glycemic Control Team Team Pager [phone] (8am-5pm) 09/07/2016 8:14 AM

## 2016-09-07 NOTE — Progress Notes (Signed)
CRITICAL VALUE ALERT  Critical value received:  CBG 66  Date of notification:  09/07/2016   Time of notification:  0010  Critical value read back:Yes.    Nurse who received alert:  Estill DoomsSimon, Esmae Donathan Mahario   MD notified (1st page):   Elray McgregorMary Lynch NP  Time of first page:  0030  MD notified (2nd page):  Time of second page:  Responding MD:  Elray McgregorMary lynch Np  Time MD responded:  [phone]

## 2016-09-07 NOTE — Progress Notes (Signed)
[facility] PROGRESS NOTE  Cassandra [name] YQM:[id number] DOB: 1946-11-01 DOA: 09/06/2016 PCP: Irving Copas, MD  Active Problems:   DKA (diabetic ketoacidoses) (HCC)   DKA, type 1 (HCC)   Brief narrative  70 y.o.femalewith medical history significant of DM1, HTN, CKD3, hypothyroidism, recent admission for DKA in the setting of medical non compliance (hospitalized from 9/11 - 9/15), now presented with persistent nausea and vomiting, poor oral intake. Pt reports she was actually doing well since last discharge until few days ago when she started to vomit and was not able to keep anything down. She denies fevers, chills, no specific sick contacts or exposures. Pt denies blood in vomitus and denies any diarrhea. Pt also reports she fell last night trying to get up but was too weak to support her weight.   In ED, pt noted to be lethargic but easy to awake. Her CBG was > 500 with AG 19. Pt started on insulin drip and [facility] asked to admit for further evaluation. VSS. SDU requested  Assessment and plan   Principal Problem:  DKA (diabetic ketoacidoses) in DM 1/ brittle diabetes. started on insulin infusion in ED and will continue for now until AG closes. AG on admission 19.  -DKA ->resolved with iv insulin. AG closed. Transitioned to sq regimen   DM uncontrolled due to renal insufficiency and etoh abuse.  A1C ~ 1 month ago was 7.2. Episode of hypoglycemia after lantus, glucose was at 33. Patient reports hypoglycemia at home with lantus.  -we will cont only ISS for now. hodl lantus. Monitor   Hyponatremia/ hypokalemia. supplement K.  BMP in AM  Vomiting. secondary to gastroparesis but please note that pt has hx of alcohol use as well  - place on Zofran as needed, add Reglan as well.  advance diet as pt able to tolerate   Hypertension. Resume home amlodipine, coreg, hold losartan due to AKI  AKI on CKD (chronic kidney disease) stage 3, GFR 30-59 ml/min - baseline Cr 1.2 -  1.5. pre renal in etiology. IVF will be provided and will repeat BMP in AM  Dizzy.  possibly from dehydration but pt also with known vertigo, ambulated well on last admission  - [facility] PT ordered on last admission   Hypothyroidism. continue synthroid  Anemiaof chronic disease. stable with no signs of bleeding     Code Status: full  Family Communication: d/w patient, RN (indicate person spoken with, relationship, and if by phone, the number) Disposition Plan: home soon    Consultants:  none  Procedures:  none  Antibiotics:  none (indicate start date, and stop date if known)  HPI/Subjective: Alert, denies acute pains, afebrile. Had episode of hypoglycemia after lantus resolved in AM  Objective: Vitals:   09/07/16 0700 09/07/16 0814  BP: (!) 156/67 (!) 153/68  Pulse: 72 75  Resp: 12   Temp:      Intake/Output Summary (Last 24 hours) at 09/07/16 0826 Last data filed at 09/07/16 0700  Gross per 24 hour  Intake             1720 ml  Output              600 ml  Net             1120 ml   Filed Weights   09/06/16 1122  Weight: 51.7 kg (114 lb)    Exam:   General:  No distress   Cardiovascular: s1,s2 rrr  Respiratory: CTA BL   Abdomen: soft,  nt, nd   Musculoskeletal: no leg edema    Data Reviewed: Basic Metabolic Panel:  Recent Labs Lab 09/06/16 1215 09/06/16 1731 09/07/16 0336  NA 132* 136 136  K 3.8 4.0 3.3*  CL 98* 105 108  CO2 15* 22 23  GLUCOSE 516* 146* 45*  BUN 26* 26* 21*  CREATININE 2.07* 1.75* 1.50*  CALCIUM 8.7* 8.1* 7.6*   Liver Function Tests:  Recent Labs Lab 09/06/16 1215  AST 54*  ALT 34  ALKPHOS 81  BILITOT 1.2  PROT 6.4*  ALBUMIN 3.2*    Recent Labs Lab 09/06/16 1731  LIPASE 22   No results for input(s): AMMONIA in the last 168 hours. CBC:  Recent Labs Lab 09/06/16 1215 09/06/16 1731 09/07/16 0336  WBC 10.0 10.6* 11.4*  NEUTROABS  --   --  6.8  HGB 10.5* 9.0* 9.0*  HCT 30.5* 25.8* 25.2*  MCV 90.0  89.3 88.4  PLT 350 292 299   Cardiac Enzymes: No results for input(s): CKTOTAL, CKMB, CKMBINDEX, TROPONINI in the last 168 hours. BNP (last 3 results)  Recent Labs  07/11/16 1600 09/06/16 1215  BNP 469.1* 187.2*    ProBNP (last 3 results) No results for input(s): PROBNP in the last 8760 hours.  CBG:  Recent Labs Lab 09/06/16 2303 09/07/16 0009 09/07/16 0056 09/07/16 0417 09/07/16 0639  GLUCAP 73 66 114* 33* 116*    No results found for this or any previous visit (from the past 240 hour(s)).   Studies: Dg Chest 2 View  Result Date: 09/06/2016 CLINICAL DATA:  Fall. EXAM: CHEST  2 VIEW COMPARISON:  07/11/2016. FINDINGS: Mediastinum and hilar structures normal. Lungs are clear. No pleural effusion or pneumothorax. Heart size normal. Degenerative changes thoracic spine. Stable mild thoracic spine compressions noted. IMPRESSION: No acute cardiopulmonary disease. Electronically Signed   By: Maisie Fushomas  Register   On: 09/06/2016 13:05   Ct Head Wo Contrast  Result Date: 09/06/2016 CLINICAL DATA:  N/V, 4 episodes of vomit this am, pt has right periorbital bruising, states that she had a dizzy spell and tell last night, no LOC, reports that she did have some vomiting yesterday in addition to the 4 episodes this am, pt is a type 1 diabetic, pt took bld sugar this am and meter registered "high"- pt took 10 units of Humalog at 9 am, per EMS it was 577 en route EXAM: CT HEAD WITHOUT CONTRAST CT MAXILLOFACIAL WITHOUT CONTRAST CT CERVICAL SPINE WITHOUT CONTRAST TECHNIQUE: Multidetector CT imaging of the head, cervical spine, and maxillofacial structures were performed using the standard protocol without intravenous contrast. Multiplanar CT image reconstructions of the cervical spine and maxillofacial structures were also generated. COMPARISON:  07/11/2016 FINDINGS: CT HEAD FINDINGS Brain: Diffuse parenchymal atrophy. Patchy areas of hypoattenuation in deep and periventricular white matter  bilaterally. Negative for acute intracranial hemorrhage, mass lesion, acute infarction, midline shift, or mass-effect. Acute infarct may be inapparent on noncontrast CT. Ventricles and sulci symmetric. Vascular: No hyperdense vessel or unexpected calcification. Skull: Normal. Negative for fracture or focal lesion. Sinuses/Orbits: Frontal sinuses hypoplastic.  No acute finding. Other: CT MAXILLOFACIAL FINDINGS Osseous: No fracture or mandibular dislocation. No destructive process. Orbits: Negative. No traumatic or inflammatory finding. Sinuses: Hypoplastic frontal sinuses. Remainder are normally developed and well aerated. Soft tissues: Negative Limited intracranial: No significant or unexpected finding. CT CERVICAL SPINE FINDINGS Alignment: Loss of the normal lordosis. Skull base and vertebrae: Asymmetric facet DJD and spurring left C2-3, right C3-4 and C4-5 with early foraminal encroachment at these  levels. Previous instrumented ACDF C5-6, hardware intact without surrounding lucency, solid-appearing fusion across the interspace. Exuberant anterior bridging osteophytes C7-T1. Soft tissues and spinal canal: Posterior partially calcified protrusions C2-3 and C6-7. Disc levels: Solid fusion across the C5-6 level. There is mild narrowing C6-7. Disc heights well preserved elsewhere. Upper chest: Negative. Other: Bilateral calcified carotid bifurcation plaque appear IMPRESSION: 1. Negative for bleed or other acute intracranial process. 2. Mild brain atrophy with nonspecific white matter changes. 3. No maxillofacial fracture or other acute finding. 4. No cervical fracture or other acute finding. 5. Degenerative and postop changes in the cervical spine as above Electronically Signed   By: Corlis Leak M.D.   On: 09/06/2016 13:16   Ct Cervical Spine Wo Contrast  Result Date: 09/06/2016 CLINICAL DATA:  N/V, 4 episodes of vomit this am, pt has right periorbital bruising, states that she had a dizzy spell and tell last night,  no LOC, reports that she did have some vomiting yesterday in addition to the 4 episodes this am, pt is a type 1 diabetic, pt took bld sugar this am and meter registered "high"- pt took 10 units of Humalog at 9 am, per EMS it was 577 en route EXAM: CT HEAD WITHOUT CONTRAST CT MAXILLOFACIAL WITHOUT CONTRAST CT CERVICAL SPINE WITHOUT CONTRAST TECHNIQUE: Multidetector CT imaging of the head, cervical spine, and maxillofacial structures were performed using the standard protocol without intravenous contrast. Multiplanar CT image reconstructions of the cervical spine and maxillofacial structures were also generated. COMPARISON:  07/11/2016 FINDINGS: CT HEAD FINDINGS Brain: Diffuse parenchymal atrophy. Patchy areas of hypoattenuation in deep and periventricular white matter bilaterally. Negative for acute intracranial hemorrhage, mass lesion, acute infarction, midline shift, or mass-effect. Acute infarct may be inapparent on noncontrast CT. Ventricles and sulci symmetric. Vascular: No hyperdense vessel or unexpected calcification. Skull: Normal. Negative for fracture or focal lesion. Sinuses/Orbits: Frontal sinuses hypoplastic.  No acute finding. Other: CT MAXILLOFACIAL FINDINGS Osseous: No fracture or mandibular dislocation. No destructive process. Orbits: Negative. No traumatic or inflammatory finding. Sinuses: Hypoplastic frontal sinuses. Remainder are normally developed and well aerated. Soft tissues: Negative Limited intracranial: No significant or unexpected finding. CT CERVICAL SPINE FINDINGS Alignment: Loss of the normal lordosis. Skull base and vertebrae: Asymmetric facet DJD and spurring left C2-3, right C3-4 and C4-5 with early foraminal encroachment at these levels. Previous instrumented ACDF C5-6, hardware intact without surrounding lucency, solid-appearing fusion across the interspace. Exuberant anterior bridging osteophytes C7-T1. Soft tissues and spinal canal: Posterior partially calcified protrusions C2-3  and C6-7. Disc levels: Solid fusion across the C5-6 level. There is mild narrowing C6-7. Disc heights well preserved elsewhere. Upper chest: Negative. Other: Bilateral calcified carotid bifurcation plaque appear IMPRESSION: 1. Negative for bleed or other acute intracranial process. 2. Mild brain atrophy with nonspecific white matter changes. 3. No maxillofacial fracture or other acute finding. 4. No cervical fracture or other acute finding. 5. Degenerative and postop changes in the cervical spine as above Electronically Signed   By: Corlis Leak M.D.   On: 09/06/2016 13:16   Ct Maxillofacial Wo Contrast  Result Date: 09/06/2016 CLINICAL DATA:  N/V, 4 episodes of vomit this am, pt has right periorbital bruising, states that she had a dizzy spell and tell last night, no LOC, reports that she did have some vomiting yesterday in addition to the 4 episodes this am, pt is a type 1 diabetic, pt took bld sugar this am and meter registered "high"- pt took 10 units of Humalog at 9 am, per  EMS it was 577 en route EXAM: CT HEAD WITHOUT CONTRAST CT MAXILLOFACIAL WITHOUT CONTRAST CT CERVICAL SPINE WITHOUT CONTRAST TECHNIQUE: Multidetector CT imaging of the head, cervical spine, and maxillofacial structures were performed using the standard protocol without intravenous contrast. Multiplanar CT image reconstructions of the cervical spine and maxillofacial structures were also generated. COMPARISON:  07/11/2016 FINDINGS: CT HEAD FINDINGS Brain: Diffuse parenchymal atrophy. Patchy areas of hypoattenuation in deep and periventricular white matter bilaterally. Negative for acute intracranial hemorrhage, mass lesion, acute infarction, midline shift, or mass-effect. Acute infarct may be inapparent on noncontrast CT. Ventricles and sulci symmetric. Vascular: No hyperdense vessel or unexpected calcification. Skull: Normal. Negative for fracture or focal lesion. Sinuses/Orbits: Frontal sinuses hypoplastic.  No acute finding. Other: CT  MAXILLOFACIAL FINDINGS Osseous: No fracture or mandibular dislocation. No destructive process. Orbits: Negative. No traumatic or inflammatory finding. Sinuses: Hypoplastic frontal sinuses. Remainder are normally developed and well aerated. Soft tissues: Negative Limited intracranial: No significant or unexpected finding. CT CERVICAL SPINE FINDINGS Alignment: Loss of the normal lordosis. Skull base and vertebrae: Asymmetric facet DJD and spurring left C2-3, right C3-4 and C4-5 with early foraminal encroachment at these levels. Previous instrumented ACDF C5-6, hardware intact without surrounding lucency, solid-appearing fusion across the interspace. Exuberant anterior bridging osteophytes C7-T1. Soft tissues and spinal canal: Posterior partially calcified protrusions C2-3 and C6-7. Disc levels: Solid fusion across the C5-6 level. There is mild narrowing C6-7. Disc heights well preserved elsewhere. Upper chest: Negative. Other: Bilateral calcified carotid bifurcation plaque appear IMPRESSION: 1. Negative for bleed or other acute intracranial process. 2. Mild brain atrophy with nonspecific white matter changes. 3. No maxillofacial fracture or other acute finding. 4. No cervical fracture or other acute finding. 5. Degenerative and postop changes in the cervical spine as above Electronically Signed   By: Corlis Leak M.D.   On: 09/06/2016 13:16    Scheduled Meds: . amLODipine  5 mg Oral Daily  . aspirin EC  325 mg Oral Daily  . atorvastatin  10 mg Oral q1800  . carvedilol  3.125 mg Oral BID WC  . clopidogrel  75 mg Oral Daily  . enoxaparin (LOVENOX) injection  30 mg Subcutaneous Q24H  . escitalopram  20 mg Oral Daily  . insulin aspart  0-24 Units Subcutaneous TID WC  . iron polysaccharides  150 mg Oral BID  . levothyroxine  88 mcg Oral QAC breakfast  . metoCLOPramide (REGLAN) injection  10 mg Intravenous Q8H   Continuous Infusions: . sodium chloride      Active Problems:   DKA (diabetic ketoacidoses)  (HCC)   DKA, type 1 (HCC)    Time spent: >35 minutes     Esperanza Sheets  [facility] Pager [phone]. If 7PM-7AM, please contact night-coverage at [email or web address], password [facility] 09/07/2016, 8:26 AM  LOS: 1 day

## 2016-09-08 DIAGNOSIS — R739 Hyperglycemia, unspecified: Secondary | ICD-10-CM

## 2016-09-08 LAB — GLUCOSE, CAPILLARY
GLUCOSE-CAPILLARY: 263 mg/dL — AB (ref 65–99)
GLUCOSE-CAPILLARY: 137 mg/dL — AB (ref 65–99)
Glucose-Capillary: 437 mg/dL — ABNORMAL HIGH (ref 65–99)
Glucose-Capillary: 28 mg/dL — CL (ref 65–99)
Glucose-Capillary: 91 mg/dL (ref 65–99)

## 2016-09-08 LAB — BASIC METABOLIC PANEL
Anion gap: 6 (ref 5–15)
BUN: 17 mg/dL (ref 6–20)
CHLORIDE: 106 mmol/L (ref 101–111)
CO2: 19 mmol/L — AB (ref 22–32)
CREATININE: 1.43 mg/dL — AB (ref 0.44–1.00)
Calcium: 7 mg/dL — ABNORMAL LOW (ref 8.9–10.3)
GFR calc Af Amer: 42 mL/min — ABNORMAL LOW (ref >60)
GFR calc non Af Amer: 36 mL/min — ABNORMAL LOW (ref >60)
Glucose, Bld: 316 mg/dL — ABNORMAL HIGH (ref 65–99)
Potassium: 4.5 mmol/L (ref 3.5–5.1)
Sodium: 131 mmol/L — ABNORMAL LOW (ref 135–145)

## 2016-09-08 MED ORDER — INSULIN ASPART 100 UNIT/ML ~~LOC~~ SOLN
0.0000 [IU] | Freq: Three times a day (TID) | SUBCUTANEOUS | Status: DC
  Administered 2016-09-08: 9 [IU] via SUBCUTANEOUS
  Administered 2016-09-08 – 2016-09-09 (×2): 5 [IU] via SUBCUTANEOUS

## 2016-09-08 MED ORDER — INSULIN ASPART 100 UNIT/ML ~~LOC~~ SOLN: 0.0000 [IU] | Freq: Every day | SUBCUTANEOUS | Status: DC

## 2016-09-08 MED ORDER — INSULIN GLARGINE 100 UNIT/ML ~~LOC~~ SOLN
5.0000 [IU] | Freq: Every day | SUBCUTANEOUS | Status: DC
  Administered 2016-09-08 – 2016-09-09 (×2): 5 [IU] via SUBCUTANEOUS
  Filled 2016-09-08 (×2): qty 0.05

## 2016-09-08 MED ORDER — SODIUM CHLORIDE 0.9 % IV SOLN
INTRAVENOUS | Status: AC
  Administered 2016-09-08: 10:00:00 via INTRAVENOUS

## 2016-09-08 MED ORDER — SODIUM CHLORIDE 0.9 % IV SOLN
INTRAVENOUS | Status: DC
  Administered 2016-09-08: 08:00:00 via INTRAVENOUS

## 2016-09-08 NOTE — Progress Notes (Signed)
[facility] PROGRESS NOTE  Cassandra [name] ION:[id number] DOB: [birth year] DOA: 09/06/2016 PCP: Irving Copas, MD  Active Problems:   DKA (diabetic ketoacidoses) (HCC)   DKA, type 1 (HCC)   Brief narrative  70 y.o.femalewith medical history significant of DM1, HTN, CKD3, hypothyroidism, recent admission for DKA in the setting of medical non compliance (hospitalized from 9/11 - 9/15), now presented with persistent nausea and vomiting, poor oral intake. Pt reports she was actually doing well since last discharge until few days ago when she started to vomit and was not able to keep anything down. She denies fevers, chills, no specific sick contacts or exposures. Pt denies blood in vomitus and denies any diarrhea. Pt also reports she fell last night trying to get up but was too weak to support her weight.   In ED, pt noted to be lethargic but easy to awake. Her CBG was > 500 with AG 19. Pt started on insulin drip and [facility] asked to admit for further evaluation. VSS. SDU requested  Assessment and plan   Principal Problem:  DKA (diabetic ketoacidoses) in DM 1/ brittle diabetes. started on insulin infusion in ED and will continue for now until AG closes. AG on admission 19.  -DKA ->resolved with iv insulin. AG closed. Transitioned to sq regimen   DM uncontrolled due to renal insufficiency and etoh abuse.  A1C ~ 1 month ago was 7.2. Episode of hypoglycemia yesterday lantus, glucose was at 33. But now hyperglycemia 437. Patient reports hypoglycemia at home with lantus.  -we will restart lantus at 5U+ISS for now. Try to calculate daily insulin requirement.  Monitor   Hyponatremia/ hypokalemia. supplemented K.    Vomiting. secondary to gastroparesis but please note that pt has hx of alcohol use as well  - place on Zofran as needed, add Reglan as well.  advance diet as pt able to tolerate   Hypertension. Resume home amlodipine, coreg, hold losartan due to AKI  AKI on CKD  (chronic kidney disease) stage 3, GFR 30-59 ml/min - baseline Cr 1.2 - 1.5. pre renal in etiology.  Dizzy, fell at home.  possibly from dehydration but pt also with known vertigo, ambulated well on last admission  - obtain PT eval  Hypothyroidism. continue synthroid  Anemiaof chronic disease. stable with no signs of bleeding     Code Status: full  Family Communication: d/w patient, RN (indicate person spoken with, relationship, and if by phone, the number) Disposition Plan: home soon, obtain PT    Consultants:  none  Procedures:  none  Antibiotics:  none (indicate start date, and stop date if known)  HPI/Subjective: Alert, denies acute pains, afebrile. Had episode of hyperglycemia AM  Objective: Vitals:   09/08/16 0118 09/08/16 0501  BP: (!) 136/54 (!) 137/55  Pulse: 68 71  Resp: 18 18  Temp: 98.5 F (36.9 C) 98.7 F (37.1 C)    Intake/Output Summary (Last 24 hours) at 09/08/16 1009 Last data filed at 09/08/16 0600  Gross per 24 hour  Intake          2131.67 ml  Output                0 ml  Net          2131.67 ml   Filed Weights   09/06/16 1122  Weight: 51.7 kg (114 lb)    Exam:   General:  No distress   Cardiovascular: s1,s2 rrr  Respiratory: CTA BL   Abdomen: soft, nt, nd  Musculoskeletal: no leg edema    Data Reviewed: Basic Metabolic Panel:  Recent Labs Lab 09/06/16 1215 09/06/16 1731 09/07/16 0336 09/08/16 0533  NA 132* 136 136 131*  K 3.8 4.0 3.3* 4.5  CL 98* 105 108 106  CO2 15* 22 23 19*  GLUCOSE 516* 146* 45* 316*  BUN 26* 26* 21* 17  CREATININE 2.07* 1.75* 1.50* 1.43*  CALCIUM 8.7* 8.1* 7.6* 7.0*   Liver Function Tests:  Recent Labs Lab 09/06/16 1215  AST 54*  ALT 34  ALKPHOS 81  BILITOT 1.2  PROT 6.4*  ALBUMIN 3.2*    Recent Labs Lab 09/06/16 1731  LIPASE 22   No results for input(s): AMMONIA in the last 168 hours. CBC:  Recent Labs Lab 09/06/16 1215 09/06/16 1731 09/07/16 0336  WBC 10.0  10.6* 11.4*  NEUTROABS  --   --  6.8  HGB 10.5* 9.0* 9.0*  HCT 30.5* 25.8* 25.2*  MCV 90.0 89.3 88.4  PLT 350 292 299   Cardiac Enzymes: No results for input(s): CKTOTAL, CKMB, CKMBINDEX, TROPONINI in the last 168 hours. BNP (last 3 results)  Recent Labs  07/11/16 1600 09/06/16 1215  BNP 469.1* 187.2*    ProBNP (last 3 results) No results for input(s): PROBNP in the last 8760 hours.  CBG:  Recent Labs Lab 09/07/16 0826 09/07/16 1211 09/07/16 1651 09/07/16 2146 09/08/16 0714  GLUCAP 124* 156* 285* 166* 437*    No results found for this or any previous visit (from the past 240 hour(s)).   Studies: Dg Chest 2 View  Result Date: 09/06/2016 CLINICAL DATA:  Fall. EXAM: CHEST  2 VIEW COMPARISON:  07/11/2016. FINDINGS: Mediastinum and hilar structures normal. Lungs are clear. No pleural effusion or pneumothorax. Heart size normal. Degenerative changes thoracic spine. Stable mild thoracic spine compressions noted. IMPRESSION: No acute cardiopulmonary disease. Electronically Signed   By: Maisie Fushomas  Register   On: 09/06/2016 13:05   Ct Head Wo Contrast  Result Date: 09/06/2016 CLINICAL DATA:  N/V, 4 episodes of vomit this am, pt has right periorbital bruising, states that she had a dizzy spell and tell last night, no LOC, reports that she did have some vomiting yesterday in addition to the 4 episodes this am, pt is a type 1 diabetic, pt took bld sugar this am and meter registered "high"- pt took 10 units of Humalog at 9 am, per EMS it was 577 en route EXAM: CT HEAD WITHOUT CONTRAST CT MAXILLOFACIAL WITHOUT CONTRAST CT CERVICAL SPINE WITHOUT CONTRAST TECHNIQUE: Multidetector CT imaging of the head, cervical spine, and maxillofacial structures were performed using the standard protocol without intravenous contrast. Multiplanar CT image reconstructions of the cervical spine and maxillofacial structures were also generated. COMPARISON:  07/11/2016 FINDINGS: CT HEAD FINDINGS Brain: Diffuse  parenchymal atrophy. Patchy areas of hypoattenuation in deep and periventricular white matter bilaterally. Negative for acute intracranial hemorrhage, mass lesion, acute infarction, midline shift, or mass-effect. Acute infarct may be inapparent on noncontrast CT. Ventricles and sulci symmetric. Vascular: No hyperdense vessel or unexpected calcification. Skull: Normal. Negative for fracture or focal lesion. Sinuses/Orbits: Frontal sinuses hypoplastic.  No acute finding. Other: CT MAXILLOFACIAL FINDINGS Osseous: No fracture or mandibular dislocation. No destructive process. Orbits: Negative. No traumatic or inflammatory finding. Sinuses: Hypoplastic frontal sinuses. Remainder are normally developed and well aerated. Soft tissues: Negative Limited intracranial: No significant or unexpected finding. CT CERVICAL SPINE FINDINGS Alignment: Loss of the normal lordosis. Skull base and vertebrae: Asymmetric facet DJD and spurring left C2-3, right C3-4 and C4-5  with early foraminal encroachment at these levels. Previous instrumented ACDF C5-6, hardware intact without surrounding lucency, solid-appearing fusion across the interspace. Exuberant anterior bridging osteophytes C7-T1. Soft tissues and spinal canal: Posterior partially calcified protrusions C2-3 and C6-7. Disc levels: Solid fusion across the C5-6 level. There is mild narrowing C6-7. Disc heights well preserved elsewhere. Upper chest: Negative. Other: Bilateral calcified carotid bifurcation plaque appear IMPRESSION: 1. Negative for bleed or other acute intracranial process. 2. Mild brain atrophy with nonspecific white matter changes. 3. No maxillofacial fracture or other acute finding. 4. No cervical fracture or other acute finding. 5. Degenerative and postop changes in the cervical spine as above Electronically Signed   By: Corlis Leak M.D.   On: 09/06/2016 13:16   Ct Cervical Spine Wo Contrast  Result Date: 09/06/2016 CLINICAL DATA:  N/V, 4 episodes of vomit this  am, pt has right periorbital bruising, states that she had a dizzy spell and tell last night, no LOC, reports that she did have some vomiting yesterday in addition to the 4 episodes this am, pt is a type 1 diabetic, pt took bld sugar this am and meter registered "high"- pt took 10 units of Humalog at 9 am, per EMS it was 577 en route EXAM: CT HEAD WITHOUT CONTRAST CT MAXILLOFACIAL WITHOUT CONTRAST CT CERVICAL SPINE WITHOUT CONTRAST TECHNIQUE: Multidetector CT imaging of the head, cervical spine, and maxillofacial structures were performed using the standard protocol without intravenous contrast. Multiplanar CT image reconstructions of the cervical spine and maxillofacial structures were also generated. COMPARISON:  07/11/2016 FINDINGS: CT HEAD FINDINGS Brain: Diffuse parenchymal atrophy. Patchy areas of hypoattenuation in deep and periventricular white matter bilaterally. Negative for acute intracranial hemorrhage, mass lesion, acute infarction, midline shift, or mass-effect. Acute infarct may be inapparent on noncontrast CT. Ventricles and sulci symmetric. Vascular: No hyperdense vessel or unexpected calcification. Skull: Normal. Negative for fracture or focal lesion. Sinuses/Orbits: Frontal sinuses hypoplastic.  No acute finding. Other: CT MAXILLOFACIAL FINDINGS Osseous: No fracture or mandibular dislocation. No destructive process. Orbits: Negative. No traumatic or inflammatory finding. Sinuses: Hypoplastic frontal sinuses. Remainder are normally developed and well aerated. Soft tissues: Negative Limited intracranial: No significant or unexpected finding. CT CERVICAL SPINE FINDINGS Alignment: Loss of the normal lordosis. Skull base and vertebrae: Asymmetric facet DJD and spurring left C2-3, right C3-4 and C4-5 with early foraminal encroachment at these levels. Previous instrumented ACDF C5-6, hardware intact without surrounding lucency, solid-appearing fusion across the interspace. Exuberant anterior bridging  osteophytes C7-T1. Soft tissues and spinal canal: Posterior partially calcified protrusions C2-3 and C6-7. Disc levels: Solid fusion across the C5-6 level. There is mild narrowing C6-7. Disc heights well preserved elsewhere. Upper chest: Negative. Other: Bilateral calcified carotid bifurcation plaque appear IMPRESSION: 1. Negative for bleed or other acute intracranial process. 2. Mild brain atrophy with nonspecific white matter changes. 3. No maxillofacial fracture or other acute finding. 4. No cervical fracture or other acute finding. 5. Degenerative and postop changes in the cervical spine as above Electronically Signed   By: Corlis Leak M.D.   On: 09/06/2016 13:16   Ct Maxillofacial Wo Contrast  Result Date: 09/06/2016 CLINICAL DATA:  N/V, 4 episodes of vomit this am, pt has right periorbital bruising, states that she had a dizzy spell and tell last night, no LOC, reports that she did have some vomiting yesterday in addition to the 4 episodes this am, pt is a type 1 diabetic, pt took bld sugar this am and meter registered "high"- pt took 10 units  of Humalog at 9 am, per EMS it was 577 en route EXAM: CT HEAD WITHOUT CONTRAST CT MAXILLOFACIAL WITHOUT CONTRAST CT CERVICAL SPINE WITHOUT CONTRAST TECHNIQUE: Multidetector CT imaging of the head, cervical spine, and maxillofacial structures were performed using the standard protocol without intravenous contrast. Multiplanar CT image reconstructions of the cervical spine and maxillofacial structures were also generated. COMPARISON:  07/11/2016 FINDINGS: CT HEAD FINDINGS Brain: Diffuse parenchymal atrophy. Patchy areas of hypoattenuation in deep and periventricular white matter bilaterally. Negative for acute intracranial hemorrhage, mass lesion, acute infarction, midline shift, or mass-effect. Acute infarct may be inapparent on noncontrast CT. Ventricles and sulci symmetric. Vascular: No hyperdense vessel or unexpected calcification. Skull: Normal. Negative for  fracture or focal lesion. Sinuses/Orbits: Frontal sinuses hypoplastic.  No acute finding. Other: CT MAXILLOFACIAL FINDINGS Osseous: No fracture or mandibular dislocation. No destructive process. Orbits: Negative. No traumatic or inflammatory finding. Sinuses: Hypoplastic frontal sinuses. Remainder are normally developed and well aerated. Soft tissues: Negative Limited intracranial: No significant or unexpected finding. CT CERVICAL SPINE FINDINGS Alignment: Loss of the normal lordosis. Skull base and vertebrae: Asymmetric facet DJD and spurring left C2-3, right C3-4 and C4-5 with early foraminal encroachment at these levels. Previous instrumented ACDF C5-6, hardware intact without surrounding lucency, solid-appearing fusion across the interspace. Exuberant anterior bridging osteophytes C7-T1. Soft tissues and spinal canal: Posterior partially calcified protrusions C2-3 and C6-7. Disc levels: Solid fusion across the C5-6 level. There is mild narrowing C6-7. Disc heights well preserved elsewhere. Upper chest: Negative. Other: Bilateral calcified carotid bifurcation plaque appear IMPRESSION: 1. Negative for bleed or other acute intracranial process. 2. Mild brain atrophy with nonspecific white matter changes. 3. No maxillofacial fracture or other acute finding. 4. No cervical fracture or other acute finding. 5. Degenerative and postop changes in the cervical spine as above Electronically Signed   By: Corlis Leak M.D.   On: 09/06/2016 13:16    Scheduled Meds: . amLODipine  5 mg Oral Daily  . aspirin EC  325 mg Oral Daily  . atorvastatin  10 mg Oral q1800  . carvedilol  3.125 mg Oral BID WC  . clopidogrel  75 mg Oral Daily  . enoxaparin (LOVENOX) injection  30 mg Subcutaneous Q24H  . escitalopram  20 mg Oral Daily  . insulin aspart  0-5 Units Subcutaneous QHS  . insulin aspart  0-9 Units Subcutaneous TID WC  . insulin glargine  5 Units Subcutaneous Daily  . iron polysaccharides  150 mg Oral BID  .  levothyroxine  88 mcg Oral QAC breakfast  . metoCLOPramide (REGLAN) injection  10 mg Intravenous Q8H   Continuous Infusions: . sodium chloride 75 mL/hr at 09/08/16 1610    Active Problems:   DKA (diabetic ketoacidoses) (HCC)   DKA, type 1 (HCC)    Time spent: >35 minutes     Esperanza Sheets  [facility] Pager [phone]. If 7PM-7AM, please contact night-coverage at [email or web address], password [facility] 09/08/2016, 10:09 AM  LOS: 2 days

## 2016-09-08 NOTE — Progress Notes (Signed)
CBG 28. Gave 240cc orange juice. MD paged and responded, hold novolog and just monitor over night. CBG rechecked and was 91. K. Vear ClockPhillips RN

## 2016-09-09 DIAGNOSIS — E038 Other specified hypothyroidism: Secondary | ICD-10-CM

## 2016-09-09 DIAGNOSIS — E876 Hypokalemia: Secondary | ICD-10-CM

## 2016-09-09 DIAGNOSIS — E1010 Type 1 diabetes mellitus with ketoacidosis without coma: Principal | ICD-10-CM

## 2016-09-09 LAB — GLUCOSE, CAPILLARY: GLUCOSE-CAPILLARY: 285 mg/dL — AB (ref 65–99)

## 2016-09-09 MED ORDER — INSULIN GLARGINE 100 UNIT/ML ~~LOC~~ SOLN: 5.0000 [IU] | Freq: Every day | SUBCUTANEOUS | 1 refills | Status: DC

## 2016-09-09 NOTE — Progress Notes (Signed)
PT Cancellation Note  Patient Details Name: Cassandra AbbeBillie A Ertel MRN: [id number] DOB: 1946-02-19   Cancelled Treatment:    Reason Eval/Treat Not Completed: PT screened, no needs identified, will sign off Pt dressed and states she is going home today.  Pt states she does not want to participate in PT at this time.  Pt states, "I'll be okay" when she goes home.  PT to sign off.   Richetta Cubillos,KATHrine E 09/09/2016, 9:59 AM Zenovia JarredKati Laurelin Elson, PT, DPT 09/09/2016 Pager: [phone]

## 2016-09-09 NOTE — Progress Notes (Signed)
Went over all discharge information with patient and family.  All questions answered.  Explained importance of taking medications as prescribed and going to follow up appointment.  Pt discharge via wheelchair.

## 2016-09-09 NOTE — Progress Notes (Signed)
Patient stated that she wants to go home and is able to care for her self.  Patient refusing to work with physical therapist and states she will not go to a rehab facility.

## 2016-09-09 NOTE — Discharge Summary (Signed)
Physician Discharge Summary  Cassandra [name]:[id number] DOB: [birth year] DOA: 09/06/2016  PCP: Irving Copas, MD  Admit date: 09/06/2016 Discharge date: 09/09/2016  Recommendations for Outpatient Follow-up:  Continue Lantus 5 units daily. Follow-up with primary care physician per scheduled appointment.  Discharge Diagnoses:  Active Problems:   DKA (diabetic ketoacidoses) (HCC)   DKA, type 1 (HCC)    Discharge Condition: stable   Diet recommendation: as tolerated   History of present illness:   Per brief narrative 09/08/2016 "70 y.o.femalewith medical history significant of DM1, HTN, CKD3, hypothyroidism, recent admission for DKA in the setting of medical non compliance (hospitalized from 9/11 - 9/15), now presented with persistent nausea and vomiting, poor oral intake. Pt reports she was actually doing well since last discharge until few days ago when she started to vomit and was not able to keep anything down. She denies fevers, chills, no specific sick contacts or exposures. Pt denies blood in vomitus and denies any diarrhea. Pt also reports she fell last night trying to get up but was too weak to support her weight.  In ED, pt noted to be lethargic but easy to awake. Her CBG was >500 with AG 19. Pt started on insulin drip and [facility] asked to admit for further evaluation. VSS. SDU requested."  Hospital Course:   DKA (diabetic ketoacidoses) in DM 1/ brittle diabetes / controlled diabetes mellitus with current long-term insulin use - May resume Lantus 5 units daily per prior home regimen - A1c about one month ago was around 7 indicating good glycemic control   Hyponatremia / hypokalemia - Sodium level 131 and potassium within normal limits  Nausea and vomiting - Likely secondary to gastroparesis however patient does have history of alcohol use - She feels better without nausea or vomiting  AKI on CKD (chronic kidney disease) stage 3, GFR 30-59 ml/min - Baseline  creatinine within 1.2 - 1.5 range - Creatinine within baseline range  Dizzy, fell at home - Patient has home health PT so we will resume those services - Maxillofacial CT without acute fractures  Hypothyroidism - Continue Synthroid  Code Status: full  Family Communication:  family not at the bedside this morning    Consultants  None   Procedures:  None   Antibiotics:  None   Signed:  Manson Passey, MD  [facility] 09/09/2016, 10:59 AM  Pager #: [phone]  Time spent in minutes: less than 30 minutes   Discharge Exam: Vitals:   09/09/16 0200 09/09/16 0600  BP: (!) 157/89 (!) 151/77  Pulse: 66 67  Resp: 16 16  Temp: 98.6 F (37 C) 98.9 F (37.2 C)   Vitals:   09/08/16 1326 09/08/16 2200 09/09/16 0200 09/09/16 0600  BP: (!) 130/55 137/68 (!) 157/89 (!) 151/77  Pulse: 66 65 66 67  Resp: 18 16 16 16   Temp: 98.3 F (36.8 C) 98.7 F (37.1 C) 98.6 F (37 C) 98.9 F (37.2 C)  TempSrc: Oral Oral Oral Oral  SpO2: 97% 96% 99% 97%  Weight:      Height:        General: Pt is alert, follows commands appropriately, not in acute distress Cardiovascular: Regular rate and rhythm, S1/S2 + Respiratory: Clear to auscultation bilaterally, no wheezing, no crackles, no rhonchi Abdominal: Soft, non tender, non distended, bowel sounds +, no guarding Extremities: no edema, no cyanosis, pulses palpable bilaterally DP and PT Neuro: Grossly nonfocal  Discharge Instructions  Discharge Instructions    Call MD for:  persistant nausea  and vomiting    Complete by:  As directed    Call MD for:  redness, tenderness, or signs of infection (pain, swelling, redness, odor or green/yellow discharge around incision site)    Complete by:  As directed    Call MD for:  severe uncontrolled pain    Complete by:  As directed    Diet - low sodium heart healthy    Complete by:  As directed    Discharge instructions    Complete by:  As directed    Continue Lantus 5 units  daily. Follow-up with primary care physician per scheduled appointment.   Increase activity slowly    Complete by:  As directed        Medication List    STOP taking these medications   folic acid 1 MG tablet Commonly known as:  FOLVITE     TAKE these medications   amLODipine 5 MG tablet Commonly known as:  NORVASC Take 5 mg by mouth daily.   aspirin EC 325 MG tablet Take 325 mg by mouth daily.   atorvastatin 10 MG tablet Commonly known as:  LIPITOR Take 1 tablet (10 mg total) by mouth daily at 6 PM.   carvedilol 3.125 MG tablet Commonly known as:  COREG Take 1 tablet (3.125 mg total) by mouth 2 (two) times daily with a meal.   clopidogrel 75 MG tablet Commonly known as:  PLAVIX Take 75 mg by mouth daily.   diazepam 5 MG tablet Commonly known as:  VALIUM Take 1 tablet (5 mg total) by mouth every 12 (twelve) hours as needed for anxiety.   escitalopram 20 MG tablet Commonly known as:  LEXAPRO Take 20 mg by mouth daily.   feeding supplement (GLUCERNA SHAKE) Liqd Take 237 mLs by mouth 3 (three) times daily between meals.   hydrocortisone 2.5 % rectal cream Commonly known as:  ANUSOL-HC Place rectally 4 (four) times daily.   insulin glargine 100 UNIT/ML injection Commonly known as:  LANTUS Inject 0.05 mLs (5 Units total) into the skin daily. Start taking on:  09/10/2016   iron polysaccharides 150 MG capsule Commonly known as:  NIFEREX Take 1 capsule (150 mg total) by mouth 2 (two) times daily. What changed:  when to take this   levothyroxine 88 MCG tablet Commonly known as:  SYNTHROID, LEVOTHROID Take 88 mcg by mouth daily before breakfast.   LORazepam 0.5 MG tablet Commonly known as:  ATIVAN Take 1 tablet (0.5 mg total) by mouth every 8 (eight) hours as needed for anxiety.   losartan 100 MG tablet Commonly known as:  COZAAR Take 100 mg by mouth daily.   meclizine 25 MG tablet Commonly known as:  ANTIVERT Take 1 tablet (25 mg total) by mouth 3 (three)  times daily as needed for dizziness.   polyethylene glycol packet Commonly known as:  MIRALAX / GLYCOLAX Take 17 g by mouth every other day. What changed:  when to take this  reasons to take this      Follow-up Information    Irving Copas, MD. Schedule an appointment as soon as possible for a visit in 1 week(s).   Specialty:  Family Medicine Contact information: [address] [place] [address] [phone]            The results of significant diagnostics from this hospitalization (including imaging, microbiology, ancillary and laboratory) are listed below for reference.    Significant Diagnostic Studies: Dg Chest 2 View  Result Date: 09/06/2016  CLINICAL DATA:  Fall. EXAM: CHEST  2 VIEW COMPARISON:  07/11/2016. FINDINGS: Mediastinum and hilar structures normal. Lungs are clear. No pleural effusion or pneumothorax. Heart size normal. Degenerative changes thoracic spine. Stable mild thoracic spine compressions noted. IMPRESSION: No acute cardiopulmonary disease. Electronically Signed   By: Maisie Fus  Register   On: 09/06/2016 13:05   Ct Head Wo Contrast  Result Date: 09/06/2016 CLINICAL DATA:  N/V, 4 episodes of vomit this am, pt has right periorbital bruising, states that she had a dizzy spell and tell last night, no LOC, reports that she did have some vomiting yesterday in addition to the 4 episodes this am, pt is a type 1 diabetic, pt took bld sugar this am and meter registered "high"- pt took 10 units of Humalog at 9 am, per EMS it was 577 en route EXAM: CT HEAD WITHOUT CONTRAST CT MAXILLOFACIAL WITHOUT CONTRAST CT CERVICAL SPINE WITHOUT CONTRAST TECHNIQUE: Multidetector CT imaging of the head, cervical spine, and maxillofacial structures were performed using the standard protocol without intravenous contrast. Multiplanar CT image reconstructions of the cervical spine and maxillofacial structures were also generated. COMPARISON:  07/11/2016 FINDINGS: CT HEAD  FINDINGS Brain: Diffuse parenchymal atrophy. Patchy areas of hypoattenuation in deep and periventricular white matter bilaterally. Negative for acute intracranial hemorrhage, mass lesion, acute infarction, midline shift, or mass-effect. Acute infarct may be inapparent on noncontrast CT. Ventricles and sulci symmetric. Vascular: No hyperdense vessel or unexpected calcification. Skull: Normal. Negative for fracture or focal lesion. Sinuses/Orbits: Frontal sinuses hypoplastic.  No acute finding. Other: CT MAXILLOFACIAL FINDINGS Osseous: No fracture or mandibular dislocation. No destructive process. Orbits: Negative. No traumatic or inflammatory finding. Sinuses: Hypoplastic frontal sinuses. Remainder are normally developed and well aerated. Soft tissues: Negative Limited intracranial: No significant or unexpected finding. CT CERVICAL SPINE FINDINGS Alignment: Loss of the normal lordosis. Skull base and vertebrae: Asymmetric facet DJD and spurring left C2-3, right C3-4 and C4-5 with early foraminal encroachment at these levels. Previous instrumented ACDF C5-6, hardware intact without surrounding lucency, solid-appearing fusion across the interspace. Exuberant anterior bridging osteophytes C7-T1. Soft tissues and spinal canal: Posterior partially calcified protrusions C2-3 and C6-7. Disc levels: Solid fusion across the C5-6 level. There is mild narrowing C6-7. Disc heights well preserved elsewhere. Upper chest: Negative. Other: Bilateral calcified carotid bifurcation plaque appear IMPRESSION: 1. Negative for bleed or other acute intracranial process. 2. Mild brain atrophy with nonspecific white matter changes. 3. No maxillofacial fracture or other acute finding. 4. No cervical fracture or other acute finding. 5. Degenerative and postop changes in the cervical spine as above Electronically Signed   By: Corlis Leak M.D.   On: 09/06/2016 13:16   Ct Cervical Spine Wo Contrast  Result Date: 09/06/2016 CLINICAL DATA:  N/V,  4 episodes of vomit this am, pt has right periorbital bruising, states that she had a dizzy spell and tell last night, no LOC, reports that she did have some vomiting yesterday in addition to the 4 episodes this am, pt is a type 1 diabetic, pt took bld sugar this am and meter registered "high"- pt took 10 units of Humalog at 9 am, per EMS it was 577 en route EXAM: CT HEAD WITHOUT CONTRAST CT MAXILLOFACIAL WITHOUT CONTRAST CT CERVICAL SPINE WITHOUT CONTRAST TECHNIQUE: Multidetector CT imaging of the head, cervical spine, and maxillofacial structures were performed using the standard protocol without intravenous contrast. Multiplanar CT image reconstructions of the cervical spine and maxillofacial structures were also generated. COMPARISON:  07/11/2016 FINDINGS: CT HEAD FINDINGS Brain:  Diffuse parenchymal atrophy. Patchy areas of hypoattenuation in deep and periventricular white matter bilaterally. Negative for acute intracranial hemorrhage, mass lesion, acute infarction, midline shift, or mass-effect. Acute infarct may be inapparent on noncontrast CT. Ventricles and sulci symmetric. Vascular: No hyperdense vessel or unexpected calcification. Skull: Normal. Negative for fracture or focal lesion. Sinuses/Orbits: Frontal sinuses hypoplastic.  No acute finding. Other: CT MAXILLOFACIAL FINDINGS Osseous: No fracture or mandibular dislocation. No destructive process. Orbits: Negative. No traumatic or inflammatory finding. Sinuses: Hypoplastic frontal sinuses. Remainder are normally developed and well aerated. Soft tissues: Negative Limited intracranial: No significant or unexpected finding. CT CERVICAL SPINE FINDINGS Alignment: Loss of the normal lordosis. Skull base and vertebrae: Asymmetric facet DJD and spurring left C2-3, right C3-4 and C4-5 with early foraminal encroachment at these levels. Previous instrumented ACDF C5-6, hardware intact without surrounding lucency, solid-appearing fusion across the interspace.  Exuberant anterior bridging osteophytes C7-T1. Soft tissues and spinal canal: Posterior partially calcified protrusions C2-3 and C6-7. Disc levels: Solid fusion across the C5-6 level. There is mild narrowing C6-7. Disc heights well preserved elsewhere. Upper chest: Negative. Other: Bilateral calcified carotid bifurcation plaque appear IMPRESSION: 1. Negative for bleed or other acute intracranial process. 2. Mild brain atrophy with nonspecific white matter changes. 3. No maxillofacial fracture or other acute finding. 4. No cervical fracture or other acute finding. 5. Degenerative and postop changes in the cervical spine as above Electronically Signed   By: Corlis Leak  Hassell M.D.   On: 09/06/2016 13:16   Ct Maxillofacial Wo Contrast  Result Date: 09/06/2016 CLINICAL DATA:  N/V, 4 episodes of vomit this am, pt has right periorbital bruising, states that she had a dizzy spell and tell last night, no LOC, reports that she did have some vomiting yesterday in addition to the 4 episodes this am, pt is a type 1 diabetic, pt took bld sugar this am and meter registered "high"- pt took 10 units of Humalog at 9 am, per EMS it was 577 en route EXAM: CT HEAD WITHOUT CONTRAST CT MAXILLOFACIAL WITHOUT CONTRAST CT CERVICAL SPINE WITHOUT CONTRAST TECHNIQUE: Multidetector CT imaging of the head, cervical spine, and maxillofacial structures were performed using the standard protocol without intravenous contrast. Multiplanar CT image reconstructions of the cervical spine and maxillofacial structures were also generated. COMPARISON:  07/11/2016 FINDINGS: CT HEAD FINDINGS Brain: Diffuse parenchymal atrophy. Patchy areas of hypoattenuation in deep and periventricular white matter bilaterally. Negative for acute intracranial hemorrhage, mass lesion, acute infarction, midline shift, or mass-effect. Acute infarct may be inapparent on noncontrast CT. Ventricles and sulci symmetric. Vascular: No hyperdense vessel or unexpected calcification. Skull:  Normal. Negative for fracture or focal lesion. Sinuses/Orbits: Frontal sinuses hypoplastic.  No acute finding. Other: CT MAXILLOFACIAL FINDINGS Osseous: No fracture or mandibular dislocation. No destructive process. Orbits: Negative. No traumatic or inflammatory finding. Sinuses: Hypoplastic frontal sinuses. Remainder are normally developed and well aerated. Soft tissues: Negative Limited intracranial: No significant or unexpected finding. CT CERVICAL SPINE FINDINGS Alignment: Loss of the normal lordosis. Skull base and vertebrae: Asymmetric facet DJD and spurring left C2-3, right C3-4 and C4-5 with early foraminal encroachment at these levels. Previous instrumented ACDF C5-6, hardware intact without surrounding lucency, solid-appearing fusion across the interspace. Exuberant anterior bridging osteophytes C7-T1. Soft tissues and spinal canal: Posterior partially calcified protrusions C2-3 and C6-7. Disc levels: Solid fusion across the C5-6 level. There is mild narrowing C6-7. Disc heights well preserved elsewhere. Upper chest: Negative. Other: Bilateral calcified carotid bifurcation plaque appear IMPRESSION: 1. Negative for bleed or other  acute intracranial process. 2. Mild brain atrophy with nonspecific white matter changes. 3. No maxillofacial fracture or other acute finding. 4. No cervical fracture or other acute finding. 5. Degenerative and postop changes in the cervical spine as above Electronically Signed   By: Corlis Leak M.D.   On: 09/06/2016 13:16    Microbiology: No results found for this or any previous visit (from the past 240 hour(s)).   Labs: Basic Metabolic Panel:  Recent Labs Lab 09/06/16 1215 09/06/16 1731 09/07/16 0336 09/08/16 0533  NA 132* 136 136 131*  K 3.8 4.0 3.3* 4.5  CL 98* 105 108 106  CO2 15* 22 23 19*  GLUCOSE 516* 146* 45* 316*  BUN 26* 26* 21* 17  CREATININE 2.07* 1.75* 1.50* 1.43*  CALCIUM 8.7* 8.1* 7.6* 7.0*   Liver Function Tests:  Recent Labs Lab  09/06/16 1215  AST 54*  ALT 34  ALKPHOS 81  BILITOT 1.2  PROT 6.4*  ALBUMIN 3.2*    Recent Labs Lab 09/06/16 1731  LIPASE 22   No results for input(s): AMMONIA in the last 168 hours. CBC:  Recent Labs Lab 09/06/16 1215 09/06/16 1731 09/07/16 0336  WBC 10.0 10.6* 11.4*  NEUTROABS  --   --  6.8  HGB 10.5* 9.0* 9.0*  HCT 30.5* 25.8* 25.2*  MCV 90.0 89.3 88.4  PLT 350 292 299   Cardiac Enzymes: No results for input(s): CKTOTAL, CKMB, CKMBINDEX, TROPONINI in the last 168 hours. BNP: BNP (last 3 results)  Recent Labs  07/11/16 1600 09/06/16 1215  BNP 469.1* 187.2*    ProBNP (last 3 results) No results for input(s): PROBNP in the last 8760 hours.  CBG:  Recent Labs Lab 09/08/16 1141 09/08/16 1707 09/08/16 1800 09/08/16 2216 09/09/16 0755  GLUCAP 263* 28* 91 137* 285*

## 2016-09-09 NOTE — Progress Notes (Signed)
Spoke with pt concerning HH. Pt is active with [facility] and plan to continue with them. Referral given to in house rep.

## 2016-09-09 NOTE — Discharge Instructions (Signed)
Insulin Glargine injection What is this medicine? INSULIN GLARGINE (IN su lin GLAR geen) is a human-made form of insulin. This drug lowers the amount of sugar in your blood. It is a long-acting insulin that is usually given once a day. This medicine may be used for other purposes; ask your health care provider or pharmacist if you have questions. What should I tell my health care provider before I take this medicine? They need to know if you have any of these conditions: -episodes of hypoglycemia -kidney disease -liver disease -an unusual or allergic reaction to insulin, metacresol, other medicines, foods, dyes, or preservatives -pregnant or trying to get pregnant -breast-feeding How should I use this medicine? This medicine is for injection under the skin. Use this medicine at the same time each day. Use exactly as directed. This insulin should never be mixed in the same syringe with other insulins before injection. Do not vigorously shake before use. You will be taught how to use this medicine and how to adjust doses for activities and illness. Do not use more insulin than prescribed. Always check the appearance of your insulin before using it. This medicine should be clear and colorless like water. Do not use it if it is cloudy, thickened, colored, or has solid particles in it. It is important that you put your used needles and syringes in a special sharps container. Do not put them in a trash can. If you do not have a sharps container, call your pharmacist or healthcare provider to get one. Talk to your pediatrician regarding the use of this medicine in children. Special care may be needed. Overdosage: If you think you have taken too much of this medicine contact a poison control center or emergency room at once. NOTE: This medicine is only for you. Do not share this medicine with others. What if I miss a dose? It is important not to miss a dose. Your health care professional or doctor should  discuss a plan for missed doses with you. If you do miss a dose, follow their plan. Do not take double doses. What may interact with this medicine? -other medicines for diabetes Many medications may cause an increase or decrease in blood sugar, these include: -alcohol containing beverages -aspirin and aspirin-like drugs -chloramphenicol -chromium -diuretics -female hormones, like estrogens or progestins and birth control pills -heart medicines -isoniazid -female hormones or anabolic steroids -medicines for weight loss -medicines for allergies, asthma, cold, or cough -medicines for mental problems -medicines called MAO Inhibitors like Nardil, Parnate, Marplan, Eldepryl -niacin -NSAIDs, medicines for pain and inflammation, like ibuprofen or naproxen -pentamidine -phenytoin -probenecid -quinolone antibiotics like ciprofloxacin, levofloxacin, ofloxacin -some herbal dietary supplements -steroid medicines like prednisone or cortisone -thyroid medicine Some medications can hide the warning symptoms of low blood sugar. You may need to monitor your blood sugar more closely if you are taking one of these medications. These include: -beta-blockers such as atenolol, metoprolol, propranolol -clonidine -guanethidine -reserpine This list may not describe all possible interactions. Give your health care provider a list of all the medicines, herbs, non-prescription drugs, or dietary supplements you use. Also tell them if you smoke, drink alcohol, or use illegal drugs. Some items may interact with your medicine. What should I watch for while using this medicine? Visit your health care professional or doctor for regular checks on your progress. Do not drive, use machinery, or do anything that needs mental alertness until you know how this medicine affects you. Alcohol may interfere with the effect   of this medicine. Avoid alcoholic drinks. A test called the HbA1C (A1C) will be monitored. This is a  simple blood test. It measures your blood sugar control over the last 2 to 3 months. You will receive this test every 3 to 6 months. Learn how to check your blood sugar. Learn the symptoms of low and high blood sugar and how to manage them. Always carry a quick-source of sugar with you in case you have symptoms of low blood sugar. Examples include hard sugar candy or glucose tablets. Make sure others know that you can choke if you eat or drink when you develop serious symptoms of low blood sugar, such as seizures or unconsciousness. They must get medical help at once. Tell your doctor or health care professional if you have high blood sugar. You might need to change the dose of your medicine. If you are sick or exercising more than usual, you might need to change the dose of your medicine. Do not skip meals. Ask your doctor or health care professional if you should avoid alcohol. Many nonprescription cough and cold products contain sugar or alcohol. These can affect blood sugar. Make sure that you have the right kind of syringe for the type of insulin you use. Try not to change the brand and type of insulin or syringe unless your health care professional or doctor tells you to. Switching insulin brand or type can cause dangerously high or low blood sugar. Always keep an extra supply of insulin, syringes, and needles on hand. Use a syringe one time only. Throw away syringe and needle in a closed container to prevent accidental needle sticks. Insulin pens and cartridges should never be shared. Even if the needle is changed, sharing may result in passing of viruses like hepatitis or HIV. Wear a medical ID bracelet or chain, and carry a card that describes your disease and details of your medicine and dosage times. What side effects may I notice from receiving this medicine? Side effects that you should report to your health care professional or doctor as soon as possible: -allergic reactions like skin rash,  itching or hives, swelling of the face, lips, or tongue -breathing problems -signs and symptoms of high blood sugar such as dizziness, dry mouth, dry skin, fruity breath, nausea, stomach pain, increased hunger or thirst, increased urination -signs and symptoms of low blood sugar such as feeling anxious, confusion, dizziness, increased hunger, unusually weak or tired, sweating, shakiness, cold, irritable, headache, blurred vision, fast heartbeat, loss of consciousness Side effects that usually do not require medical attention (report to your health care professional or doctor if they continue or are bothersome): -increase or decrease in fatty tissue under the skin due to overuse of a particular injection site -itching, burning, swelling, or rash at site where injected This list may not describe all possible side effects. Call your doctor for medical advice about side effects. You may report side effects to FDA at 1-800-FDA-1088. Where should I keep my medicine? Keep out of the reach of children. Store unopened vials in a refrigerator between 2 and 8 degrees C (36 and 46 degrees F). Do not freeze or use if the insulin has been frozen. Opened vials (vials currently in use) may be stored in the refrigerator or at room temperature, at approximately 25 degrees C (77 degrees F) or cooler. Keeping your insulin at room temperature decreases the amount of pain during injection. Once opened, your insulin can be used for 28 days. After 28 days,   the vial should be thrown away. Store Lantus Solostar Pens or Basaglar KwikPens in a refrigerator between 2 and 8 degrees C (36 and 46 degrees F) or at room temperature below 30 degrees C (86 degrees F). Do not freeze or use if the insulin has been frozen. Once opened, the pens should be kept at room temperature. Do not store in the refrigerator once opened. Once opened, the insulin can be used for 28 days. After 28 days, the Lantus Solostar Pen or Basaglar KwikPen should be  thrown away. Store Toujeo Solostar Pens in a refrigerator between 2 and 8 degrees C (36 and 46 degrees F). Do not freeze or use if the insulin has been frozen. Once opened, the pens should be kept at room temperature below 30 degrees C (86 degrees F). Do not store in the refrigerator once opened. Once opened, the insulin can be used for 42 days. After 42 days, the Toujeo Solostar Pen should be thrown away. Protect all insulin vials and pens from light and excessive heat. Throw away any unused medicine after the expiration date or after the specified time for room temperature storage has passed. NOTE: This sheet is a summary. It may not cover all possible information. If you have questions about this medicine, talk to your doctor, pharmacist, or health care provider.    2016, [employer]. (2014-12-12 10:12:42)  

## 2016-09-15 DIAGNOSIS — Z9289 Personal history of other medical treatment: Secondary | ICD-10-CM

## 2016-09-15 HISTORY — DX: Personal history of other medical treatment: Z92.89

## 2016-09-18 ENCOUNTER — Emergency Department (HOSPITAL_COMMUNITY)
Admission: EM | Admit: 2016-09-18 | Discharge: 2016-09-18 | Disposition: A | Discharge status: Home / Self Care | Payer: Medicare Other | Source: Home / Self Care | Attending: Emergency Medicine | Admitting: Emergency Medicine

## 2016-09-18 ENCOUNTER — Encounter (HOSPITAL_COMMUNITY): Payer: Self-pay | Admitting: Emergency Medicine

## 2016-09-18 ENCOUNTER — Emergency Department (HOSPITAL_COMMUNITY): Payer: Medicare Other

## 2016-09-18 DIAGNOSIS — I130 Hypertensive heart and chronic kidney disease with heart failure and stage 1 through stage 4 chronic kidney disease, or unspecified chronic kidney disease: Secondary | ICD-10-CM

## 2016-09-18 DIAGNOSIS — N183 Chronic kidney disease, stage 3 (moderate): Secondary | ICD-10-CM | POA: Insufficient documentation

## 2016-09-18 DIAGNOSIS — R600 Localized edema: Secondary | ICD-10-CM

## 2016-09-18 DIAGNOSIS — E1010 Type 1 diabetes mellitus with ketoacidosis without coma: Secondary | ICD-10-CM | POA: Diagnosis not present

## 2016-09-18 DIAGNOSIS — Z8673 Personal history of transient ischemic attack (TIA), and cerebral infarction without residual deficits: Secondary | ICD-10-CM | POA: Insufficient documentation

## 2016-09-18 DIAGNOSIS — R1013 Epigastric pain: Secondary | ICD-10-CM | POA: Insufficient documentation

## 2016-09-18 DIAGNOSIS — E1022 Type 1 diabetes mellitus with diabetic chronic kidney disease: Secondary | ICD-10-CM

## 2016-09-18 DIAGNOSIS — F17210 Nicotine dependence, cigarettes, uncomplicated: Secondary | ICD-10-CM

## 2016-09-18 DIAGNOSIS — E039 Hypothyroidism, unspecified: Secondary | ICD-10-CM

## 2016-09-18 DIAGNOSIS — I5033 Acute on chronic diastolic (congestive) heart failure: Secondary | ICD-10-CM

## 2016-09-18 DIAGNOSIS — R609 Edema, unspecified: Secondary | ICD-10-CM

## 2016-09-18 LAB — URINE MICROSCOPIC-ADD ON

## 2016-09-18 LAB — URINALYSIS, ROUTINE W REFLEX MICROSCOPIC
Bilirubin Urine: NEGATIVE
Glucose, UA: 500 mg/dL — AB
Ketones, ur: NEGATIVE mg/dL
LEUKOCYTES UA: NEGATIVE
Nitrite: NEGATIVE
Protein, ur: >300 mg/dL — AB
SPECIFIC GRAVITY, URINE: 1.025 (ref 1.005–1.030)
pH: 5 (ref 5.0–8.0)

## 2016-09-18 LAB — CBC
HCT: 33 % — ABNORMAL LOW (ref 36.0–46.0)
HEMOGLOBIN: 11 g/dL — AB (ref 12.0–15.0)
MCH: 30.6 pg (ref 26.0–34.0)
MCHC: 33.3 g/dL (ref 30.0–36.0)
MCV: 91.7 fL (ref 78.0–100.0)
Platelets: 365 10*3/uL (ref 150–400)
RBC: 3.6 MIL/uL — AB (ref 3.87–5.11)
RDW: 13.9 % (ref 11.5–15.5)
WBC: 8.9 10*3/uL (ref 4.0–10.5)

## 2016-09-18 LAB — COMPREHENSIVE METABOLIC PANEL
ALT: 28 U/L (ref 14–54)
ANION GAP: 11 (ref 5–15)
AST: 25 U/L (ref 15–41)
Albumin: 3 g/dL — ABNORMAL LOW (ref 3.5–5.0)
Alkaline Phosphatase: 80 U/L (ref 38–126)
BUN: 18 mg/dL (ref 6–20)
CALCIUM: 8.2 mg/dL — AB (ref 8.9–10.3)
CHLORIDE: 106 mmol/L (ref 101–111)
CO2: 18 mmol/L — AB (ref 22–32)
Creatinine, Ser: 1.29 mg/dL — ABNORMAL HIGH (ref 0.44–1.00)
GFR, EST AFRICAN AMERICAN: 48 mL/min — AB (ref >60)
GFR, EST NON AFRICAN AMERICAN: 41 mL/min — AB (ref >60)
Glucose, Bld: 234 mg/dL — ABNORMAL HIGH (ref 65–99)
Potassium: 3.7 mmol/L (ref 3.5–5.1)
SODIUM: 135 mmol/L (ref 135–145)
Total Bilirubin: 0.2 mg/dL — ABNORMAL LOW (ref 0.3–1.2)
Total Protein: 6.1 g/dL — ABNORMAL LOW (ref 6.5–8.1)

## 2016-09-18 LAB — I-STAT TROPONIN, ED: Troponin i, poc: 0.02 ng/mL (ref 0.00–0.08)

## 2016-09-18 LAB — LIPASE, BLOOD: LIPASE: 123 U/L — AB (ref 11–51)

## 2016-09-18 LAB — BRAIN NATRIURETIC PEPTIDE: B NATRIURETIC PEPTIDE 5: 196 pg/mL — AB (ref 0.0–100.0)

## 2016-09-18 LAB — CBG MONITORING, ED: GLUCOSE-CAPILLARY: 244 mg/dL — AB (ref 65–99)

## 2016-09-18 MED ORDER — IOPAMIDOL (ISOVUE-300) INJECTION 61%
75.0000 mL | Freq: Once | INTRAVENOUS | Status: AC | PRN
  Administered 2016-09-18: 75 mL via INTRAVENOUS

## 2016-09-18 MED ORDER — OXYCODONE-ACETAMINOPHEN 5-325 MG PO TABS: 2.0000 | ORAL_TABLET | ORAL | 0 refills | Status: DC | PRN

## 2016-09-18 MED ORDER — MORPHINE SULFATE (PF) 2 MG/ML IV SOLN
2.0000 mg | Freq: Once | INTRAVENOUS | Status: AC
  Administered 2016-09-18: 2 mg via INTRAVENOUS
  Filled 2016-09-18: qty 1

## 2016-09-18 MED ORDER — OXYCODONE-ACETAMINOPHEN 5-325 MG PO TABS
1.0000 | ORAL_TABLET | Freq: Once | ORAL | Status: AC
  Administered 2016-09-18: 1 via ORAL
  Filled 2016-09-18: qty 1

## 2016-09-18 MED ORDER — ONDANSETRON HCL 4 MG/2ML IJ SOLN
4.0000 mg | Freq: Four times a day (QID) | INTRAMUSCULAR | Status: DC | PRN
  Administered 2016-09-18: 4 mg via INTRAVENOUS
  Filled 2016-09-18: qty 2

## 2016-09-18 MED ORDER — IOPAMIDOL (ISOVUE-300) INJECTION 61%: 80.0000 mL | Freq: Once | INTRAVENOUS | Status: DC | PRN

## 2016-09-18 MED ORDER — ONDANSETRON HCL 4 MG PO TABS: 4.0000 mg | ORAL_TABLET | Freq: Four times a day (QID) | ORAL | 0 refills | Status: DC

## 2016-09-18 MED ORDER — SENNOSIDES-DOCUSATE SODIUM 8.6-50 MG PO TABS: 1.0000 | ORAL_TABLET | Freq: Every day | ORAL | 0 refills | Status: DC

## 2016-09-18 MED ORDER — IOPAMIDOL (ISOVUE-300) INJECTION 61%
30.0000 mL | Freq: Once | INTRAVENOUS | Status: DC | PRN
  Administered 2016-09-18: 30 mL via ORAL
  Filled 2016-09-18: qty 30

## 2016-09-18 NOTE — ED Notes (Signed)
RN will start IV line 

## 2016-09-18 NOTE — ED Triage Notes (Signed)
[facility] Patient was seen at her PCP today for abdominal pain/vomiting. Her blood sugar was 474 at home. She took 10 units of Humalog at home. At her PCP, her blood sugar read 314. Patient states she has noticed bilateral ankle swelling x1 week. Patient is alert, oriented x4, ambulatory.

## 2016-09-18 NOTE — Discharge Instructions (Signed)

## 2016-09-18 NOTE — ED Notes (Signed)
Waiting to repeat EKG..the patient is in xray

## 2016-09-18 NOTE — ED Provider Notes (Signed)
Emergency Department Provider Note   I have reviewed the triage vital signs and the nursing notes.   HISTORY  Chief Complaint Abdominal Pain and Leg Swelling   HPI Cassandra [name] is a 70 y.o. female with PMH of CKD, CHF, and IDDM presents to the emergency department for evaluation of nausea, vomiting, diffuse abdominal discomfort, subjective fever and chills, and elevated blood sugars at home. The patient has a history of frequent pancreatitis states this feels somewhat different. Her ongoing for several days but worsened today with vomiting. No hematemesis. No blood in the stool. She continues to have bowel movements. She went to see her primary care physician who referred her here for fear of pancreatitis. Patient is not recorded any fevers at home but feels chills. She's been compliant with her insulin but continues to have elevated blood sugars. No dysuria, hesitancy, urgency.  The patient is also complaining of worsening lower extremity edema. This morning she's had some right-sided chest discomfort. No significant shortness of breath at this time. No ongoing chest discomfort.    Past Medical History:  Diagnosis Date  . Allergic rhinitis   . Anxiety   . Benign hypertension with CKD (chronic kidney disease) stage III   . Benign paroxysmal positional vertigo   . Bipolar disorder (HCC)   . Broken finger   . Broken shoulder   . Broken toes   . Cervicalgia   . CHF (congestive heart failure) (HCC) 07/2016  . CKD (chronic kidney disease) stage 3, GFR 30-59 ml/min   . Depression   . Diabetes mellitus without complication (HCC)   . Elevated liver enzymes Hep B/C neg 2014  . GERD (gastroesophageal reflux disease)   . Glaucoma   . History of alcohol use   . Hypertension   . Hypothyroidism   . Interstitial cystitis    bladder stretched every 9 months  . Migraines   . Psoriasis   . Stroke (HCC) 05/16/2016   Left occipital and thalamic, right hippocampal  . Thyroid disease     . Tobacco use   . 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 Vitamin B12 deficiency     Patient Active Problem List   Diagnosis Date Noted  . DKA, type 1 (HCC) 09/06/2016  . Hypoglycemia associated with diabetes (HCC)   . Orthostatic hypotension   . Protein-calorie malnutrition, severe 08/28/2016  . UTI (lower urinary tract infection) 07/19/2016  . AKI (acute kidney injury) (HCC) 07/19/2016  . Chronic diastolic heart failure (HCC) 07/19/2016  . Dehydration   . Cerebral embolism with cerebral infarction 07/13/2016  . Acute diastolic CHF (congestive heart failure) (HCC) 07/11/2016  . Cerebrovascular accident (CVA) (HCC)   . Essential hypertension   . HLD (hyperlipidemia)   . Anemia of chronic disease   . Physical deconditioning   . Acute renal failure superimposed on stage 3 chronic kidney disease (HCC)   . Diabetic ketoacidosis with coma associated with type 1 diabetes mellitus (HCC)   . History of ETT   . Stroke (HCC) 05/16/2016  . Cerebral thrombosis with cerebral infarction 05/15/2016  . Acute on chronic renal failure (HCC)   . Generalized anxiety disorder 05/10/2016  . DKA (diabetic ketoacidoses) (HCC) 05/06/2016  . Acute respiratory failure (HCC) 05/06/2016  . Acute respiratory failure with hypoxia (HCC) 05/06/2016  . Acute encephalopathy 05/06/2016  . Septic shock (HCC)   . Acute respiratory failure with hypoxia and hypercapnia (HCC)   . 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 Depression   . Hypertension   . CKD (chronic kidney disease) stage 3, GFR 30-59 ml/min   . Hypothyroidism   . Vitamin B12 deficiency   . Benign paroxysmal positional vertigo   . Anxiety   . Allergic rhinitis   . Glaucoma   . Benign hypertension with CKD (chronic kidney disease) stage III   . Tobacco use   . Cervicalgia   . Elevated liver enzymes   . History of alcohol use     Past Surgical History:  Procedure Laterality Date  . ABDOMINAL  HYSTERECTOMY     with oophorectomy  . APPENDECTOMY    . BLADDER SURGERY    . CATARACT EXTRACTION    . CERVICAL DISC SURGERY    . CHOLECYSTECTOMY    . HERNIA REPAIR    . TONSILLECTOMY      Current Outpatient Rx  . Order #: [id number] Class: Historical Med  . Order #: [id number] Class: Historical Med  . Order #: [id number] Class: Normal  . Order #: [id number] Class: Historical Med  . Order #: [id number] Class: Historical Med  . Order #: [id number] Class: Historical Med  . Order #: [id number] Class: Print  . Order #: [id number] Class: Normal  . Order #: [id number] Class: Historical Med  . Order #: [id number] Class: Print  . Order #: [id number] Class: Historical Med  . Order #: [id number] Class: Normal  . Order #: [id number] Class: Normal  . Order #: [id number] Class: Normal  . Order #: [id number] Class: Print  . Order #: [id number] Class: Normal  . Order #: [id number] Class: Normal  . Order #: [id number] Class: Print  . Order #: [id number] Class: Print  . Order #: [id number] Class: Print    Allergies Ciprofloxacin; Hydrocodone; Codeine; Doxycycline; Omnicef [cefdinir]; and Augmentin [amoxicillin-pot clavulanate]  Family History  Problem Relation Age of Onset  . Alcohol abuse Mother   . Arthritis Mother   . Asthma Mother   . Cancer Mother     colon cancer  . Hypertension Mother   . Migraines Mother   . Stroke Mother   . Lung disease Mother   . COPD Mother   . Diabetes Father   . Hypertension Father   . Heart disease Father   . Heart attack Father   . Heart disease Paternal Grandmother   . Diabetes Paternal Grandmother   . Stroke Paternal Grandmother   . Cancer Paternal Grandmother   . Diabetes Paternal Grandfather     Social History Social History  Substance Use Topics  . Smoking status: Current Every Day Smoker    Packs/day: 0.25    Types: Cigarettes  . Smokeless tobacco: Never Used  . Alcohol use No     Comment: Hasn't had any alcohol since last admission    Review of  Systems  Constitutional: Subjective fever/chills Eyes: No visual changes. ENT: No sore throat. Cardiovascular: Positive right sided chest pain. Respiratory: Denies shortness of breath. Gastrointestinal: Positive abdominal pain. Positive nausea and vomiting.  No diarrhea.  No constipation. Genitourinary: Negative for dysuria. Musculoskeletal: Negative for back pain. Skin: Negative for rash. Neurological: Negative for headaches, focal weakness or numbness.  10-point ROS otherwise negative.  ____________________________________________   PHYSICAL EXAM:  VITAL SIGNS: ED Triage Vitals  Enc Vitals Group     BP 09/18/16 1215 109/65     Pulse Rate 09/18/16 1215 85     Resp 09/18/16 1215 16     Temp 09/18/16 1215 97.6 F (36.4 C)     Temp Source 09/18/16 1215 Oral     SpO2 09/18/16 1215 100 %  Weight 09/18/16 1214 114 lb (51.7 kg)     Height 09/18/16 1214 5\' 2"  (1.575 m)     Pain Score 09/18/16 1214 10   Constitutional: Alert and oriented. Well appearing and in no acute distress. Eyes: Conjunctivae are normal. Head: Atraumatic. Nose: No congestion/rhinnorhea. Mouth/Throat: Mucous membranes are dry. Oropharynx non-erythematous. Neck: No stridor.  Cardiovascular: Normal rate, regular rhythm. Good peripheral circulation. Grossly normal heart sounds.   Respiratory: Normal respiratory effort.  No retractions. Lungs CTAB. Gastrointestinal: Soft with primarily right sided abdominal pain. No rebound or guarding. No distention.  Musculoskeletal: No lower extremity tenderness but with 2+ pitting edema in bilateral LE. No gross deformities of extremities. Neurologic:  Normal speech and language. No gross focal neurologic deficits are appreciated.  Skin:  Skin is warm, dry and intact. No rash noted. Psychiatric: Mood and affect are normal. Speech and behavior are normal.  ____________________________________________   LABS (all labs ordered are listed, but only abnormal results are  displayed)  Labs Reviewed  LIPASE, BLOOD - Abnormal; Notable for the following:       Result Value   Lipase 123 (*)    All other components within normal limits  COMPREHENSIVE METABOLIC PANEL - Abnormal; Notable for the following:    CO2 18 (*)    Glucose, Bld 234 (*)    Creatinine, Ser 1.29 (*)    Calcium 8.2 (*)    Total Protein 6.1 (*)    Albumin 3.0 (*)    Total Bilirubin 0.2 (*)    GFR calc non Af Amer 41 (*)    GFR calc Af Amer 48 (*)    All other components within normal limits  CBC - Abnormal; Notable for the following:    RBC 3.60 (*)    Hemoglobin 11.0 (*)    HCT 33.0 (*)    All other components within normal limits  URINALYSIS, ROUTINE W REFLEX MICROSCOPIC (NOT AT [facility]) - Abnormal; Notable for the following:    APPearance CLOUDY (*)    Glucose, UA 500 (*)    Hgb urine dipstick SMALL (*)    Protein, ur >300 (*)    All other components within normal limits  BRAIN NATRIURETIC PEPTIDE - Abnormal; Notable for the following:    B Natriuretic Peptide 196.0 (*)    All other components within normal limits  URINE MICROSCOPIC-ADD ON - Abnormal; Notable for the following:    Squamous Epithelial / LPF 6-30 (*)    Bacteria, UA MANY (*)    Casts HYALINE CASTS (*)    All other components within normal limits  CBG MONITORING, ED - Abnormal; Notable for the following:    Glucose-Capillary 244 (*)    All other components within normal limits  I-STAT TROPOININ, ED   ____________________________________________  EKG   EKG Interpretation  Date/Time:  Wednesday September 18 2016 14:57:42 EDT Ventricular Rate:  79 PR Interval:    QRS Duration: 89 QT Interval:  384 QTC Calculation: 441 R Axis:   72 Text Interpretation:  Sinus rhythm Low voltage, precordial leads Baseline wander in lead(s) II III aVL aVF No clear STEMI but plan to repeat with wandering baseline.  Confirmed by LONG MD, JOSHUA [phone]) on 09/18/2016 3:01:12 PM Also confirmed by LONG MD, JOSHUA [phone]), editor Stout  CT, Jola Babinski [phone])  on 09/18/2016 3:21:25 PM       ____________________________________________  RADIOLOGY  Dg Chest 2 View  Result Date: 09/18/2016 CLINICAL DATA:  Left abdominal pain. Bilateral lower extremity swelling. EXAM: CHEST  2 VIEW COMPARISON:  CT chest 05/12/2016.  PA and lateral chest 07/11/2016. FINDINGS: The lungs appear clear. Small right pleural effusion is identified. No pneumothorax. Heart size is normal. IMPRESSION: Small right pleural effusion.  Otherwise negative Electronically Signed   By: Drusilla Kanner M.D.   On: 09/18/2016 15:23   Ct Abdomen Pelvis W Contrast  Result Date: 09/18/2016 CLINICAL DATA:  LEFT-sided abdominal pain EXAM: CT ABDOMEN AND PELVIS WITH CONTRAST TECHNIQUE: Multidetector CT imaging of the abdomen and pelvis was performed using the standard protocol following bolus administration of intravenous contrast. CONTRAST:  75mL ISOVUE-300 IOPAMIDOL (ISOVUE-300) INJECTION 61%, 30mL ISOVUE-300 IOPAMIDOL (ISOVUE-300) INJECTION 61% COMPARISON:  07/21/2016 FINDINGS: Lower chest: Lung bases are clear. Hepatobiliary: No focal hepatic lesion. Mild intra and extrahepatic biliary duct dilatation following cholecystectomy. The common bile duct measures 10 mm in diameter. Pancreas: No pancreatic duct dilatation. The pancreatic parenchyma is normal. Spleen: Normal Adrenals/urinary tract: Adrenal glands and kidneys are normal. The ureters and bladder normal. Stomach/Bowel: Stomach, small-bowel and cecum are normal. Appendix not identified. Colon and rectosigmoid colon are normal. Vascular/Lymphatic: Abdominal aorta is normal caliber with atherosclerotic calcification. There is no retroperitoneal or periportal lymphadenopathy. No pelvic lymphadenopathy. Reproductive: Post hysterectomy anatomy. Other: No ventral hernia or inguinal hernia. Anasarca of the soft tissues of the flanks. Musculoskeletal: No aggressive osseous lesion. Mottled appearance of the bones may relate to  chronic renal insufficiency IMPRESSION: 1. No clear explanation for LEFT-sided pain. No diverticulitis or obstructive uropathy. 2. Small bilateral pleural effusions and mild anasarca. 3.  Atherosclerotic calcification of the aorta. 4. Intra and extrahepatic duct dilatation likely relates to prior cholecystectomy. Recommend correlation with bilirubin levels. Electronically Signed   By: Genevive Bi M.D.   On: 09/18/2016 17:22    ____________________________________________   PROCEDURES  Procedure(s) performed:   Procedures  None ____________________________________________   INITIAL IMPRESSION / ASSESSMENT AND PLAN / ED COURSE  Pertinent labs & imaging results that were available during my care of the patient were reviewed by me and considered in my medical decision making (see chart for details).  Patient resents emergency department for evaluation of abdominal discomfort, elevated blood sugars, nausea and vomiting with bilateral lower extremity leg swelling and right-sided chest pain is more. No fevers or chills recorded home but any subjective symptoms. Patient has primarily right-sided abdominal discomfort that seems worse in the right lower quadrant. She has long history of pancreatitis this seems atypical. Lipase mildly elevated. Baseline CKD. CO2 is low but no anion gap. Urine with evidence of dehydration but no ketones. Plan for symptom control and CT scan of the abdomen.     06:28 PM Patient is tolerating PO fluids with oral pain medication.  CT scan of the abdomen and pelvis reviewed with the patient and husband.  No evidence of focal infiltrate on chest x-ray or significant edema. No evidence of DKA. Plan for discharge home with primary care physician follow-up. We'll provide pain  medication along with stool softener and nausea medication for presumed mild pancreatitis-related pain.  Discussed return precautions in detail.   At this time, I do not feel there is any  life-threatening condition present. I have reviewed and discussed all results (EKG, imaging, lab, urine as appropriate), exam findings with patient. I have reviewed nursing notes and appropriate previous records.  I feel the patient is safe to be discharged home without further emergent workup. Discussed usual and customary return precautions. Patient and family (if present) verbalize understanding and are comfortable with this plan.  Patient will follow-up with their primary care provider. If they do not have a primary care provider, information for follow-up has been provided to them. All questions have been answered.  ____________________________________________  FINAL CLINICAL IMPRESSION(S) / ED DIAGNOSES  Final diagnoses:  Epigastric pain  Peripheral edema     MEDICATIONS GIVEN DURING THIS VISIT:  Medications  ondansetron (ZOFRAN) injection 4 mg (4 mg Intravenous Given 09/18/16 1526)  iopamidol (ISOVUE-300) 61 % injection 30 mL (30 mLs Oral Contrast Given 09/18/16 1700)  iopamidol (ISOVUE-300) 61 % injection 80 mL (not administered)  morphine 2 MG/ML injection 2 mg (2 mg Intravenous Given 09/18/16 1526)  iopamidol (ISOVUE-300) 61 % injection 75 mL (75 mLs Intravenous Contrast Given 09/18/16 1659)  oxyCODONE-acetaminophen (PERCOCET/ROXICET) 5-325 MG per tablet 1 tablet (1 tablet Oral Given 09/18/16 1758)     NEW OUTPATIENT MEDICATIONS STARTED DURING THIS VISIT:  New Prescriptions   ONDANSETRON (ZOFRAN) 4 MG TABLET    Take 1 tablet (4 mg total) by mouth every 6 (six) hours.   OXYCODONE-ACETAMINOPHEN (PERCOCET/ROXICET) 5-325 MG TABLET    Take 2 tablets by mouth every 4 (four) hours as needed for severe pain.   SENNA-DOCUSATE (SENOKOT-S) 8.6-50 MG TABLET    Take 1 tablet by mouth daily.      Note:  This document was prepared using Dragon voice recognition software and may include unintentional dictation errors.  Alona Bene, MD Emergency Medicine   Maia Plan, MD 09/18/16  [phone]

## 2016-09-20 ENCOUNTER — Encounter (HOSPITAL_COMMUNITY): Payer: Self-pay | Admitting: Emergency Medicine

## 2016-09-20 ENCOUNTER — Inpatient Hospital Stay (HOSPITAL_COMMUNITY): Payer: Medicare Other

## 2016-09-20 ENCOUNTER — Inpatient Hospital Stay (HOSPITAL_COMMUNITY)
Admission: EM | Admit: 2016-09-20 | Discharge: 2016-09-23 | DRG: 638 | Disposition: A | Discharge status: Home Health | Payer: Medicare Other | Attending: Internal Medicine | Admitting: Internal Medicine

## 2016-09-20 DIAGNOSIS — Z888 Allergy status to other drugs, medicaments and biological substances status: Secondary | ICD-10-CM

## 2016-09-20 DIAGNOSIS — Z9049 Acquired absence of other specified parts of digestive tract: Secondary | ICD-10-CM

## 2016-09-20 DIAGNOSIS — Z7902 Long term (current) use of antithrombotics/antiplatelets: Secondary | ICD-10-CM | POA: Diagnosis not present

## 2016-09-20 DIAGNOSIS — N189 Chronic kidney disease, unspecified: Secondary | ICD-10-CM

## 2016-09-20 DIAGNOSIS — G43909 Migraine, unspecified, not intractable, without status migrainosus: Secondary | ICD-10-CM | POA: Diagnosis present

## 2016-09-20 DIAGNOSIS — Z8249 Family history of ischemic heart disease and other diseases of the circulatory system: Secondary | ICD-10-CM

## 2016-09-20 DIAGNOSIS — IMO0002 Reserved for concepts with insufficient information to code with codable children: Secondary | ICD-10-CM | POA: Diagnosis present

## 2016-09-20 DIAGNOSIS — Z6820 Body mass index (BMI) 20.0-20.9, adult: Secondary | ICD-10-CM | POA: Diagnosis not present

## 2016-09-20 DIAGNOSIS — E1165 Type 2 diabetes mellitus with hyperglycemia: Secondary | ICD-10-CM | POA: Diagnosis present

## 2016-09-20 DIAGNOSIS — L409 Psoriasis, unspecified: Secondary | ICD-10-CM | POA: Diagnosis present

## 2016-09-20 DIAGNOSIS — N183 Chronic kidney disease, stage 3 (moderate): Secondary | ICD-10-CM | POA: Diagnosis present

## 2016-09-20 DIAGNOSIS — E1022 Type 1 diabetes mellitus with diabetic chronic kidney disease: Secondary | ICD-10-CM | POA: Diagnosis present

## 2016-09-20 DIAGNOSIS — Z79899 Other long term (current) drug therapy: Secondary | ICD-10-CM

## 2016-09-20 DIAGNOSIS — E538 Deficiency of other specified B group vitamins: Secondary | ICD-10-CM | POA: Diagnosis present

## 2016-09-20 DIAGNOSIS — Z9119 Patient's noncompliance with other medical treatment and regimen: Secondary | ICD-10-CM

## 2016-09-20 DIAGNOSIS — Z8673 Personal history of transient ischemic attack (TIA), and cerebral infarction without residual deficits: Secondary | ICD-10-CM | POA: Diagnosis not present

## 2016-09-20 DIAGNOSIS — F17210 Nicotine dependence, cigarettes, uncomplicated: Secondary | ICD-10-CM | POA: Diagnosis present

## 2016-09-20 DIAGNOSIS — I5032 Chronic diastolic (congestive) heart failure: Secondary | ICD-10-CM | POA: Diagnosis present

## 2016-09-20 DIAGNOSIS — F419 Anxiety disorder, unspecified: Secondary | ICD-10-CM | POA: Diagnosis present

## 2016-09-20 DIAGNOSIS — I130 Hypertensive heart and chronic kidney disease with heart failure and stage 1 through stage 4 chronic kidney disease, or unspecified chronic kidney disease: Secondary | ICD-10-CM | POA: Diagnosis present

## 2016-09-20 DIAGNOSIS — Z79891 Long term (current) use of opiate analgesic: Secondary | ICD-10-CM

## 2016-09-20 DIAGNOSIS — E860 Dehydration: Secondary | ICD-10-CM | POA: Diagnosis present

## 2016-09-20 DIAGNOSIS — K219 Gastro-esophageal reflux disease without esophagitis: Secondary | ICD-10-CM | POA: Diagnosis present

## 2016-09-20 DIAGNOSIS — Z7982 Long term (current) use of aspirin: Secondary | ICD-10-CM

## 2016-09-20 DIAGNOSIS — R1110 Vomiting, unspecified: Secondary | ICD-10-CM

## 2016-09-20 DIAGNOSIS — N179 Acute kidney failure, unspecified: Secondary | ICD-10-CM | POA: Diagnosis present

## 2016-09-20 DIAGNOSIS — E875 Hyperkalemia: Secondary | ICD-10-CM | POA: Diagnosis present

## 2016-09-20 DIAGNOSIS — Z881 Allergy status to other antibiotic agents status: Secondary | ICD-10-CM

## 2016-09-20 DIAGNOSIS — E785 Hyperlipidemia, unspecified: Secondary | ICD-10-CM | POA: Diagnosis present

## 2016-09-20 DIAGNOSIS — E1110 Type 2 diabetes mellitus with ketoacidosis without coma: Secondary | ICD-10-CM

## 2016-09-20 DIAGNOSIS — E039 Hypothyroidism, unspecified: Secondary | ICD-10-CM | POA: Diagnosis present

## 2016-09-20 DIAGNOSIS — H409 Unspecified glaucoma: Secondary | ICD-10-CM | POA: Diagnosis present

## 2016-09-20 DIAGNOSIS — Z825 Family history of asthma and other chronic lower respiratory diseases: Secondary | ICD-10-CM

## 2016-09-20 DIAGNOSIS — F319 Bipolar disorder, unspecified: Secondary | ICD-10-CM | POA: Diagnosis present

## 2016-09-20 DIAGNOSIS — D638 Anemia in other chronic diseases classified elsewhere: Secondary | ICD-10-CM | POA: Diagnosis present

## 2016-09-20 DIAGNOSIS — N289 Disorder of kidney and ureter, unspecified: Secondary | ICD-10-CM

## 2016-09-20 DIAGNOSIS — Z720 Tobacco use: Secondary | ICD-10-CM

## 2016-09-20 DIAGNOSIS — I10 Essential (primary) hypertension: Secondary | ICD-10-CM

## 2016-09-20 DIAGNOSIS — E1010 Type 1 diabetes mellitus with ketoacidosis without coma: Secondary | ICD-10-CM | POA: Diagnosis present

## 2016-09-20 DIAGNOSIS — Z9114 Patient's other noncompliance with medication regimen: Secondary | ICD-10-CM

## 2016-09-20 DIAGNOSIS — Z885 Allergy status to narcotic agent status: Secondary | ICD-10-CM

## 2016-09-20 DIAGNOSIS — Z823 Family history of stroke: Secondary | ICD-10-CM

## 2016-09-20 DIAGNOSIS — Z794 Long term (current) use of insulin: Secondary | ICD-10-CM | POA: Diagnosis not present

## 2016-09-20 LAB — CBC WITH DIFFERENTIAL/PLATELET
Basophils Absolute: 0 10*3/uL (ref 0.0–0.1)
Basophils Relative: 0 %
Eosinophils Absolute: 0 10*3/uL (ref 0.0–0.7)
Eosinophils Relative: 0 %
HCT: 33.2 % — ABNORMAL LOW (ref 36.0–46.0)
Hemoglobin: 9.8 g/dL — ABNORMAL LOW (ref 12.0–15.0)
Lymphocytes Relative: 6 %
Lymphs Abs: 0.9 10*3/uL (ref 0.7–4.0)
MCH: 30.2 pg (ref 26.0–34.0)
MCHC: 29.5 g/dL — ABNORMAL LOW (ref 30.0–36.0)
MCV: 102.5 fL — ABNORMAL HIGH (ref 78.0–100.0)
Monocytes Absolute: 0.1 10*3/uL (ref 0.1–1.0)
Monocytes Relative: 1 %
Neutro Abs: 12.2 10*3/uL — ABNORMAL HIGH (ref 1.7–7.7)
Neutrophils Relative %: 93 %
Platelets: 357 10*3/uL (ref 150–400)
RBC: 3.24 MIL/uL — ABNORMAL LOW (ref 3.87–5.11)
RDW: 14.5 % (ref 11.5–15.5)
WBC: 13.2 10*3/uL — ABNORMAL HIGH (ref 4.0–10.5)

## 2016-09-20 LAB — GLUCOSE, CAPILLARY
GLUCOSE-CAPILLARY: 392 mg/dL — AB (ref 65–99)
Glucose-Capillary: 544 mg/dL (ref 65–99)
Glucose-Capillary: 342 mg/dL — ABNORMAL HIGH (ref 65–99)
Glucose-Capillary: 259 mg/dL — ABNORMAL HIGH (ref 65–99)
Glucose-Capillary: 192 mg/dL — ABNORMAL HIGH (ref 65–99)

## 2016-09-20 LAB — COMPREHENSIVE METABOLIC PANEL
ALK PHOS: 95 U/L (ref 38–126)
ALT: 32 U/L (ref 14–54)
ANION GAP: 25 — AB (ref 5–15)
AST: 24 U/L (ref 15–41)
Albumin: 2.8 g/dL — ABNORMAL LOW (ref 3.5–5.0)
BUN: 30 mg/dL — ABNORMAL HIGH (ref 6–20)
CHLORIDE: 101 mmol/L (ref 101–111)
CO2: 7 mmol/L — ABNORMAL LOW (ref 22–32)
Calcium: 8.3 mg/dL — ABNORMAL LOW (ref 8.9–10.3)
Creatinine, Ser: 2.17 mg/dL — ABNORMAL HIGH (ref 0.44–1.00)
GFR, EST AFRICAN AMERICAN: 26 mL/min — AB (ref >60)
GFR, EST NON AFRICAN AMERICAN: 22 mL/min — AB (ref >60)
Glucose, Bld: 892 mg/dL (ref 65–99)
Potassium: 6 mmol/L — ABNORMAL HIGH (ref 3.5–5.1)
SODIUM: 133 mmol/L — AB (ref 135–145)
TOTAL PROTEIN: 6 g/dL — AB (ref 6.5–8.1)
Total Bilirubin: 1.8 mg/dL — ABNORMAL HIGH (ref 0.3–1.2)

## 2016-09-20 LAB — MAGNESIUM: Magnesium: 1.6 mg/dL — ABNORMAL LOW (ref 1.7–2.4)

## 2016-09-20 LAB — BASIC METABOLIC PANEL
Anion gap: 9 (ref 5–15)
Anion gap: 11 (ref 5–15)
BUN: 58 mg/dL — ABNORMAL HIGH (ref 6–20)
BUN: 30 mg/dL — ABNORMAL HIGH (ref 6–20)
CALCIUM: 8.6 mg/dL — AB (ref 8.9–10.3)
CALCIUM: 7.6 mg/dL — AB (ref 8.9–10.3)
CO2: 20 mmol/L — AB (ref 22–32)
CO2: 14 mmol/L — AB (ref 22–32)
CREATININE: 3.23 mg/dL — AB (ref 0.44–1.00)
CREATININE: 2.15 mg/dL — AB (ref 0.44–1.00)
Chloride: 107 mmol/L (ref 101–111)
Chloride: 114 mmol/L — ABNORMAL HIGH (ref 101–111)
GFR calc Af Amer: 26 mL/min — ABNORMAL LOW (ref >60)
GFR calc non Af Amer: 14 mL/min — ABNORMAL LOW (ref >60)
GFR calc non Af Amer: 22 mL/min — ABNORMAL LOW (ref >60)
GFR, EST AFRICAN AMERICAN: 16 mL/min — AB (ref >60)
GLUCOSE: 147 mg/dL — AB (ref 65–99)
GLUCOSE: 200 mg/dL — AB (ref 65–99)
Potassium: 3.7 mmol/L (ref 3.5–5.1)
Potassium: 3.3 mmol/L — ABNORMAL LOW (ref 3.5–5.1)
Sodium: 136 mmol/L (ref 135–145)
Sodium: 139 mmol/L (ref 135–145)

## 2016-09-20 LAB — RAPID URINE DRUG SCREEN, HOSP PERFORMED
AMPHETAMINES: NOT DETECTED
BARBITURATES: NOT DETECTED
BENZODIAZEPINES: POSITIVE — AB
Cocaine: NOT DETECTED
Opiates: NOT DETECTED
Tetrahydrocannabinol: NOT DETECTED

## 2016-09-20 LAB — URINALYSIS, ROUTINE W REFLEX MICROSCOPIC
Bilirubin Urine: NEGATIVE
Glucose, UA: >1000 mg/dL — AB
Ketones, ur: 15 mg/dL — AB
Leukocytes, UA: NEGATIVE
Nitrite: NEGATIVE
Protein, ur: >300 mg/dL — AB
Specific Gravity, Urine: 1.025 (ref 1.005–1.030)
pH: 5 (ref 5.0–8.0)

## 2016-09-20 LAB — LIPASE, BLOOD: LIPASE: 32 U/L (ref 11–51)

## 2016-09-20 LAB — MRSA PCR SCREENING: MRSA by PCR: NEGATIVE

## 2016-09-20 LAB — URINE MICROSCOPIC-ADD ON

## 2016-09-20 LAB — PHOSPHORUS: Phosphorus: 4 mg/dL (ref 2.5–4.6)

## 2016-09-20 LAB — TROPONIN I: Troponin I: 0.07 ng/mL (ref <0.03)

## 2016-09-20 LAB — CBG MONITORING, ED
Glucose-Capillary: >600 mg/dL (ref 65–99)
Glucose-Capillary: >600 mg/dL (ref 65–99)
Glucose-Capillary: >600 mg/dL (ref 65–99)

## 2016-09-20 LAB — ETHANOL

## 2016-09-20 MED ORDER — HEPARIN SODIUM (PORCINE) 5000 UNIT/ML IJ SOLN
5000.0000 [IU] | Freq: Three times a day (TID) | INTRAMUSCULAR | Status: DC
  Administered 2016-09-20 – 2016-09-23 (×9): 5000 [IU] via SUBCUTANEOUS
  Filled 2016-09-20 (×9): qty 1

## 2016-09-20 MED ORDER — SODIUM CHLORIDE 0.9 % IV SOLN: INTRAVENOUS | Status: DC

## 2016-09-20 MED ORDER — AMLODIPINE BESYLATE 5 MG PO TABS
5.0000 mg | ORAL_TABLET | Freq: Every day | ORAL | Status: DC
  Administered 2016-09-20 – 2016-09-21 (×2): 5 mg via ORAL
  Filled 2016-09-20 (×2): qty 1

## 2016-09-20 MED ORDER — CARVEDILOL 3.125 MG PO TABS
3.1250 mg | ORAL_TABLET | Freq: Two times a day (BID) | ORAL | Status: DC
  Administered 2016-09-20 – 2016-09-23 (×6): 3.125 mg via ORAL
  Filled 2016-09-20 (×6): qty 1

## 2016-09-20 MED ORDER — NICOTINE 14 MG/24HR TD PT24
14.0000 mg | MEDICATED_PATCH | Freq: Every day | TRANSDERMAL | Status: DC
  Administered 2016-09-20 – 2016-09-22 (×3): 14 mg via TRANSDERMAL
  Filled 2016-09-20 (×4): qty 1

## 2016-09-20 MED ORDER — LORAZEPAM 0.5 MG PO TABS
0.5000 mg | ORAL_TABLET | Freq: Three times a day (TID) | ORAL | Status: DC | PRN
  Administered 2016-09-21 – 2016-09-23 (×4): 0.5 mg via ORAL
  Filled 2016-09-20 (×4): qty 1

## 2016-09-20 MED ORDER — ATORVASTATIN CALCIUM 10 MG PO TABS
10.0000 mg | ORAL_TABLET | Freq: Every day | ORAL | Status: DC
  Administered 2016-09-21 – 2016-09-22 (×2): 10 mg via ORAL
  Filled 2016-09-20 (×2): qty 1

## 2016-09-20 MED ORDER — SODIUM CHLORIDE 0.9 % IV SOLN: INTRAVENOUS | Status: AC

## 2016-09-20 MED ORDER — LEVOTHYROXINE SODIUM 88 MCG PO TABS
88.0000 ug | ORAL_TABLET | Freq: Every day | ORAL | Status: DC
  Administered 2016-09-21 – 2016-09-23 (×3): 88 ug via ORAL
  Filled 2016-09-20 (×3): qty 1

## 2016-09-20 MED ORDER — DEXTROSE-NACL 5-0.45 % IV SOLN: INTRAVENOUS | Status: DC

## 2016-09-20 MED ORDER — DEXTROSE 50 % IV SOLN: 25.0000 mL | INTRAVENOUS | Status: DC | PRN

## 2016-09-20 MED ORDER — SODIUM CHLORIDE 0.9 % IV SOLN
INTRAVENOUS | Status: DC
  Administered 2016-09-20: 5.4 [IU]/h via INTRAVENOUS
  Administered 2016-09-20: 16.2 [IU]/h via INTRAVENOUS
  Filled 2016-09-20: qty 2.5

## 2016-09-20 MED ORDER — SODIUM CHLORIDE 0.9 % IV SOLN
INTRAVENOUS | Status: DC
  Administered 2016-09-20: 14:00:00 via INTRAVENOUS

## 2016-09-20 MED ORDER — POLYETHYLENE GLYCOL 3350 17 G PO PACK
17.0000 g | PACK | Freq: Every day | ORAL | Status: DC | PRN
  Administered 2016-09-21: 17 g via ORAL
  Filled 2016-09-20: qty 1

## 2016-09-20 MED ORDER — SODIUM CHLORIDE 0.9 % IV SOLN
Freq: Once | INTRAVENOUS | Status: AC
  Administered 2016-09-20: 14:00:00 via INTRAVENOUS

## 2016-09-20 MED ORDER — DEXTROSE-NACL 5-0.45 % IV SOLN
INTRAVENOUS | Status: DC
  Administered 2016-09-20: 23:00:00 via INTRAVENOUS

## 2016-09-20 MED ORDER — PANTOPRAZOLE SODIUM 40 MG PO TBEC
40.0000 mg | DELAYED_RELEASE_TABLET | Freq: Every day | ORAL | Status: DC
  Administered 2016-09-21 – 2016-09-23 (×3): 40 mg via ORAL
  Filled 2016-09-20 (×3): qty 1

## 2016-09-20 MED ORDER — CLOPIDOGREL BISULFATE 75 MG PO TABS
75.0000 mg | ORAL_TABLET | Freq: Every day | ORAL | Status: DC
  Administered 2016-09-20 – 2016-09-23 (×4): 75 mg via ORAL
  Filled 2016-09-20 (×4): qty 1

## 2016-09-20 MED ORDER — INSULIN REGULAR BOLUS VIA INFUSION
0.0000 [IU] | Freq: Three times a day (TID) | INTRAVENOUS | Status: DC
  Filled 2016-09-20: qty 10

## 2016-09-20 MED ORDER — ONDANSETRON HCL 4 MG/2ML IJ SOLN: 4.0000 mg | Freq: Four times a day (QID) | INTRAMUSCULAR | Status: DC | PRN

## 2016-09-20 MED ORDER — ASPIRIN 81 MG PO CHEW
81.0000 mg | CHEWABLE_TABLET | Freq: Every day | ORAL | Status: DC
  Administered 2016-09-20 – 2016-09-23 (×4): 81 mg via ORAL
  Filled 2016-09-20 (×4): qty 1

## 2016-09-20 MED ORDER — POLYSACCHARIDE IRON COMPLEX 150 MG PO CAPS
150.0000 mg | ORAL_CAPSULE | Freq: Two times a day (BID) | ORAL | Status: DC
  Administered 2016-09-20 – 2016-09-23 (×6): 150 mg via ORAL
  Filled 2016-09-20 (×7): qty 1

## 2016-09-20 MED ORDER — ESCITALOPRAM OXALATE 10 MG PO TABS
20.0000 mg | ORAL_TABLET | Freq: Every day | ORAL | Status: DC
  Administered 2016-09-20 – 2016-09-23 (×4): 20 mg via ORAL
  Filled 2016-09-20 (×2): qty 1
  Filled 2016-09-20 (×2): qty 2

## 2016-09-20 NOTE — ED Notes (Signed)
PA made aware of patient's glucose and potassium levels.

## 2016-09-20 NOTE — ED Provider Notes (Signed)
Pt seen and evaluated.  Discussed with Trisha MangleKaren Sophia PA-C. Patient awake and alert. Mentating well. Not tachypneic. Complains of vomiting. Noted mild acute kidney injury, and DKA. Given fluids and on insulin currently. Agree with plan and admission as above.   Rolland PorterMark Osmani Kersten, MD 09/20/16 [phone]

## 2016-09-20 NOTE — ED Provider Notes (Signed)
[facility]-EMERGENCY DEPT Provider Note   CSN: [id number] Arrival date & time: 09/20/16  7829     History   Chief Complaint Chief Complaint  Patient presents with  . Abdominal Pain    HPI Cassandra WALDVOGEL is a 70 y.o. female.  The history is provided by the patient. No language interpreter was used.  Abdominal Pain   This is a new problem. The current episode started 2 days ago. The problem occurs constantly. The problem has been gradually worsening. The pain is associated with eating. The pain is located in the generalized abdominal region. The pain is moderate. Nothing aggravates the symptoms. Nothing relieves the symptoms. Past medical history comments: Pancreatitis.  Pt has a history of diabetes. Husband reports pt vomited multiple times last night.  Pt has complained about abdominal pain.  No fever, no diarrhea.  Pt thinks she has pancreatitis  Past Medical History:  Diagnosis Date  . Allergic rhinitis   . Anxiety   . Benign hypertension with CKD (chronic kidney disease) stage III   . Benign paroxysmal positional vertigo   . Bipolar disorder (HCC)   . Broken finger   . Broken shoulder   . Broken toes   . Cervicalgia   . CHF (congestive heart failure) (HCC) 07/2016  . CKD (chronic kidney disease) stage 3, GFR 30-59 ml/min   . Depression   . Diabetes mellitus without complication (HCC)   . Elevated liver enzymes Hep B/C neg 2014  . GERD (gastroesophageal reflux disease)   . Glaucoma   . History of alcohol use   . Hypertension   . Hypothyroidism   . Interstitial cystitis    bladder stretched every 9 months  . Migraines   . Psoriasis   . Stroke (HCC) 05/16/2016   Left occipital and thalamic, right hippocampal  . Thyroid disease   . Tobacco use   . 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 Vitamin B12 deficiency     Patient Active Problem List   Diagnosis Date Noted  . DKA, type 1 (HCC) 09/06/2016  . Hypoglycemia associated with diabetes (HCC)     . Orthostatic hypotension   . Protein-calorie malnutrition, severe 08/28/2016  . UTI (lower urinary tract infection) 07/19/2016  . AKI (acute kidney injury) (HCC) 07/19/2016  . Chronic diastolic heart failure (HCC) 07/19/2016  . Dehydration   . Cerebral embolism with cerebral infarction 07/13/2016  . Acute diastolic CHF (congestive heart failure) (HCC) 07/11/2016  . Cerebrovascular accident (CVA) (HCC)   . Essential hypertension   . HLD (hyperlipidemia)   . Anemia of chronic disease   . Physical deconditioning   . Acute renal failure superimposed on stage 3 chronic kidney disease (HCC)   . Diabetic ketoacidosis with coma associated with type 1 diabetes mellitus (HCC)   . History of ETT   . Stroke (HCC) 05/16/2016  . Cerebral thrombosis with cerebral infarction 05/15/2016  . Acute on chronic renal failure (HCC)   . Generalized anxiety disorder 05/10/2016  . DKA (diabetic ketoacidoses) (HCC) 05/06/2016  . Acute respiratory failure (HCC) 05/06/2016  . Acute respiratory failure with hypoxia (HCC) 05/06/2016  . Acute encephalopathy 05/06/2016  . Septic shock (HCC)   . Acute respiratory failure with hypoxia and hypercapnia (HCC)   . 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 Depression   . Hypertension   . CKD (chronic kidney disease) stage 3, GFR 30-59 ml/min   . Hypothyroidism   . Vitamin B12 deficiency   .  Benign paroxysmal positional vertigo   . Anxiety   . Allergic rhinitis   . Glaucoma   . Benign hypertension with CKD (chronic kidney disease) stage III   . Tobacco use   . Cervicalgia   . Elevated liver enzymes   . History of alcohol use     Past Surgical History:  Procedure Laterality Date  . ABDOMINAL HYSTERECTOMY     with oophorectomy  . APPENDECTOMY    . BLADDER SURGERY    . CATARACT EXTRACTION    . CERVICAL DISC SURGERY    . CHOLECYSTECTOMY    . HERNIA REPAIR    . TONSILLECTOMY      OB History    No data available       Home  Medications    Prior to Admission medications   Medication Sig Start Date End Date Taking? Authorizing Provider  atorvastatin (LIPITOR) 10 MG tablet Take 1 tablet (10 mg total) by mouth daily at 6 PM. 05/19/16  Yes Rolly Salter, MD  carvedilol (COREG) 3.125 MG tablet Take 1 tablet (3.125 mg total) by mouth 2 (two) times daily with a meal. 07/18/16  Yes Mauricio Annett Gula, MD  clopidogrel (PLAVIX) 75 MG tablet Take 75 mg by mouth daily.   Yes Historical Provider, MD  escitalopram (LEXAPRO) 20 MG tablet Take 20 mg by mouth daily.   Yes Historical Provider, MD  amLODipine (NORVASC) 5 MG tablet Take 5 mg by mouth daily.    Historical Provider, MD  aspirin EC 325 MG tablet Take 325 mg by mouth daily.    Historical Provider, MD  diazepam (VALIUM) 5 MG tablet Take 1 tablet (5 mg total) by mouth every 12 (twelve) hours as needed for anxiety. 08/30/16   Dorothea Ogle, MD  feeding supplement, GLUCERNA SHAKE, (GLUCERNA SHAKE) LIQD Take 237 mLs by mouth 3 (three) times daily between meals. Patient not taking: Reported on 06/22/2016 05/19/16   Rolly Salter, MD  HUMALOG 100 UNIT/ML injection Inject 5-10 Units as directed 4 (four) times daily -  with meals and at bedtime. 09/02/16   Historical Provider, MD  hydrocortisone (ANUSOL-HC) 2.5 % rectal cream Place rectally 4 (four) times daily. Patient not taking: Reported on 09/18/2016 05/19/16   Rolly Salter, MD  insulin glargine (LANTUS) 100 UNIT/ML injection Inject 0.05 mLs (5 Units total) into the skin daily. Patient taking differently: Inject 10 Units into the skin daily.  09/10/16   Alison Murray, MD  iron polysaccharides (NIFEREX) 150 MG capsule Take 1 capsule (150 mg total) by mouth 2 (two) times daily. Patient taking differently: Take 150 mg by mouth daily.  06/28/16   Vassie Loll, MD  levothyroxine (SYNTHROID, LEVOTHROID) 88 MCG tablet Take 88 mcg by mouth daily before breakfast.    Historical Provider, MD  LORazepam (ATIVAN) 0.5 MG tablet Take 1 tablet (0.5  mg total) by mouth every 8 (eight) hours as needed for anxiety. 05/19/16   Rolly Salter, MD  losartan (COZAAR) 100 MG tablet Take 100 mg by mouth daily.    Historical Provider, MD  meclizine (ANTIVERT) 25 MG tablet Take 1 tablet (25 mg total) by mouth 3 (three) times daily as needed for dizziness. 02/12/16   Chilton Si, MD  ondansetron (ZOFRAN) 4 MG tablet Take 1 tablet (4 mg total) by mouth every 6 (six) hours. 09/18/16   Maia Plan, MD  oxyCODONE-acetaminophen (PERCOCET/ROXICET) 5-325 MG tablet Take 2 tablets by mouth every 4 (four) hours as needed for severe  pain. 09/18/16   Maia PlanJoshua G Long, MD  polyethylene glycol [facility]MIRALAX / Ethelene HalGLYCOLAX) packet Take 17 g by mouth every other day. Patient taking differently: Take 17 g by mouth daily as needed for mild constipation.  05/19/16   Rolly SalterPranav M Patel, MD  senna-docusate (SENOKOT-S) 8.6-50 MG tablet Take 1 tablet by mouth daily. 09/18/16   Maia PlanJoshua G Long, MD    Family History Family History  Problem Relation Age of Onset  . Alcohol abuse Mother   . Arthritis Mother   . Asthma Mother   . Cancer Mother     colon cancer  . Hypertension Mother   . Migraines Mother   . Stroke Mother   . Lung disease Mother   . COPD Mother   . Diabetes Father   . Hypertension Father   . Heart disease Father   . Heart attack Father   . Heart disease Paternal Grandmother   . Diabetes Paternal Grandmother   . Stroke Paternal Grandmother   . Cancer Paternal Grandmother   . Diabetes Paternal Grandfather     Social History Social History  Substance Use Topics  . Smoking status: Current Every Day Smoker    Packs/day: 0.25    Types: Cigarettes  . Smokeless tobacco: Never Used  . Alcohol use No     Comment: Hasn't had any alcohol since last admission     Allergies   Ciprofloxacin; Hydrocodone; Codeine; Doxycycline; Omnicef [cefdinir]; and Augmentin [amoxicillin-pot clavulanate]   Review of Systems Review of Systems  Gastrointestinal: Positive for abdominal  pain.  All other systems reviewed and are negative.    Physical Exam Updated Vital Signs BP (!) 115/51 (BP Location: Left Arm)   Pulse 115   Temp 98.1 F (36.7 C) (Oral)   Resp 18   Ht 5\' 2"  (1.575 m)   Wt 51.7 kg   SpO2 100%   BMI 20.85 kg/m   Physical Exam  Constitutional: She appears well-developed and well-nourished. No distress.  HENT:  Head: Normocephalic and atraumatic.  Eyes: Conjunctivae are normal.  Neck: Neck supple.  Cardiovascular: Normal rate and regular rhythm.   No murmur heard. Pulmonary/Chest: Effort normal and breath sounds normal. No respiratory distress.  Abdominal: Soft. She exhibits no mass. There is tenderness. There is no rebound.  Musculoskeletal: She exhibits no edema.  Neurological: She is alert.  Skin: Skin is warm and dry.  Psychiatric: She has a normal mood and affect.  Nursing note and vitals reviewed.    ED Treatments / Results  Labs (all labs ordered are listed, but only abnormal results are displayed) Labs Reviewed  CBC WITH DIFFERENTIAL/PLATELET - Abnormal; Notable for the following:       Result Value   WBC 13.2 (*)    RBC 3.24 (*)    Hemoglobin 9.8 (*)    HCT 33.2 (*)    MCV 102.5 (*)    MCHC 29.5 (*)    Neutro Abs 12.2 (*)    All other components within normal limits  COMPREHENSIVE METABOLIC PANEL - Abnormal; Notable for the following:    Sodium 133 (*)    Potassium 6.0 (*)    CO2 7 (*)    Glucose, Bld 892 (*)    BUN 30 (*)    Creatinine, Ser 2.17 (*)    Calcium 8.3 (*)    Total Protein 6.0 (*)    Albumin 2.8 (*)    Total Bilirubin 1.8 (*)    GFR calc non Af Amer 22 (*)  GFR calc Af Amer 26 (*)    Anion gap 25 (*)    All other components within normal limits  URINALYSIS, ROUTINE W REFLEX MICROSCOPIC (NOT AT [facility]) - Abnormal; Notable for the following:    APPearance CLOUDY (*)    Glucose, UA >1000 (*)    Hgb urine dipstick TRACE (*)    Ketones, ur 15 (*)    Protein, ur >300 (*)    All other components  within normal limits  URINE MICROSCOPIC-ADD ON - Abnormal; Notable for the following:    Squamous Epithelial / LPF 0-5 (*)    Bacteria, UA FEW (*)    Casts HYALINE CASTS (*)    All other components within normal limits  CBG MONITORING, ED - Abnormal; Notable for the following:    Glucose-Capillary >600 (*)    All other components within normal limits  LIPASE, BLOOD    EKG  EKG Interpretation None       Radiology Dg Chest 2 View  Result Date: 09/18/2016 CLINICAL DATA:  Left abdominal pain. Bilateral lower extremity swelling. EXAM: CHEST  2 VIEW COMPARISON:  CT chest 05/12/2016.  PA and lateral chest 07/11/2016. FINDINGS: The lungs appear clear. Small right pleural effusion is identified. No pneumothorax. Heart size is normal. IMPRESSION: Small right pleural effusion.  Otherwise negative Electronically Signed   By: Drusilla Kanner M.D.   On: 09/18/2016 15:23   Ct Abdomen Pelvis W Contrast  Result Date: 09/18/2016 CLINICAL DATA:  LEFT-sided abdominal pain EXAM: CT ABDOMEN AND PELVIS WITH CONTRAST TECHNIQUE: Multidetector CT imaging of the abdomen and pelvis was performed using the standard protocol following bolus administration of intravenous contrast. CONTRAST:  75mL ISOVUE-300 IOPAMIDOL (ISOVUE-300) INJECTION 61%, 30mL ISOVUE-300 IOPAMIDOL (ISOVUE-300) INJECTION 61% COMPARISON:  07/21/2016 FINDINGS: Lower chest: Lung bases are clear. Hepatobiliary: No focal hepatic lesion. Mild intra and extrahepatic biliary duct dilatation following cholecystectomy. The common bile duct measures 10 mm in diameter. Pancreas: No pancreatic duct dilatation. The pancreatic parenchyma is normal. Spleen: Normal Adrenals/urinary tract: Adrenal glands and kidneys are normal. The ureters and bladder normal. Stomach/Bowel: Stomach, small-bowel and cecum are normal. Appendix not identified. Colon and rectosigmoid colon are normal. Vascular/Lymphatic: Abdominal aorta is normal caliber with atherosclerotic  calcification. There is no retroperitoneal or periportal lymphadenopathy. No pelvic lymphadenopathy. Reproductive: Post hysterectomy anatomy. Other: No ventral hernia or inguinal hernia. Anasarca of the soft tissues of the flanks. Musculoskeletal: No aggressive osseous lesion. Mottled appearance of the bones may relate to chronic renal insufficiency IMPRESSION: 1. No clear explanation for LEFT-sided pain. No diverticulitis or obstructive uropathy. 2. Small bilateral pleural effusions and mild anasarca. 3.  Atherosclerotic calcification of the aorta. 4. Intra and extrahepatic duct dilatation likely relates to prior cholecystectomy. Recommend correlation with bilirubin levels. Electronically Signed   By: Genevive Bi M.D.   On: 09/18/2016 17:22    Procedures Procedures (including critical care time)  Medications Ordered in ED Medications  dextrose 5 %-0.45 % sodium chloride infusion (not administered)  insulin regular bolus via infusion 0-10 Units (not administered)  insulin regular (NOVOLIN R,HUMULIN R) 250 Units in sodium chloride 0.9 % 250 mL (1 Units/mL) infusion (5.4 Units/hr Intravenous New Bag/Given 09/20/16 1404)  dextrose 50 % solution 25 mL (not administered)  0.9 %  sodium chloride infusion ( Intravenous New Bag/Given 09/20/16 1352)  0.9 %  sodium chloride infusion ( Intravenous New Bag/Given 09/20/16 1352)     Initial Impression / Assessment and Plan / ED Course  I have reviewed the triage  vital signs and the nursing notes.  Pertinent labs & imaging results that were available during my care of the patient were reviewed by me and considered in my medical decision making (see chart for details).  Clinical Course    Pt had Ct 2 days ago.  No acute abdominal abnormality.   Labs show glucose of 892,  BUN of 30 creat of 2.17  Co2 is 7 Anion gap is 25.  Pt given iv fluids,  Placed on an insulin drip with glucomander.    Final Clinical Impressions(s) / ED Diagnoses   Final diagnoses:   Diabetic ketoacidosis without coma associated with type 1 diabetes mellitus (HCC)  Non-intractable vomiting, presence of nausea not specified, unspecified vomiting type  Acute renal disease    New Prescriptions New Prescriptions   No medications on file     Elson Areas, PA-C 09/20/16 1450    Rolland Porter, MD 10/04/16 1014

## 2016-09-20 NOTE — ED Triage Notes (Addendum)
Per EMS, patient has had abdominal pain, nausea, vomiting x2 weeks. Denies diarrhea. Patient seen here 09/18/2016 for the same. Patient received zofran 4mg  en route with EMS. Patient is from home.

## 2016-09-20 NOTE — ED Notes (Signed)
Bed: WA01 Expected date:  Expected time:  Means of arrival:  Comments: EMS-abdominal pain-triage

## 2016-09-20 NOTE — H&P (Signed)
History and Physical    Cassandra AbbeBillie A Baca [id number] DOB: May 15, 1946 DOA: 09/20/2016  Referring Provider: Dr. Rolland PorterMark James PCP: Irving CopasHACKER,ROBERT KELLER, MD   Patient coming from: home  Chief Complaint: nausea, vomiting, DKA  HPI: Cassandra [name] is a 70 y.o. female with PMH significant for DM1, HTN, CKD3, hypothyroidism, GERD, depression, anxiety, hx of CVA and recent admission for DKA in the setting of medical non compliance (9/22--9/27); who presented to ED with complaints of nausea, vomiting, inability to keep things down and her glucometer reading high.She denies fevers, chills, no specific sick contacts or exposures, no CP, no SOB and no dysuria. Pt also denies blood in vomitus and denies any diarrhea.  ED Course: in ED was found to have DKA, gap 28, acute on chronic renal failure, mild hyperkalemia and CBG > 800. Patient received IVF's bolus, was started on insulin drip and [facility] was called to admit patient for further evaluation and treatment.   Review of Systems:  All other systems reviewed and apart from HPI, are negative.  Past Medical History:  Diagnosis Date  . Allergic rhinitis   . Anxiety   . Benign hypertension with CKD (chronic kidney disease) stage III   . Benign paroxysmal positional vertigo   . Bipolar disorder (HCC)   . Broken finger   . Broken shoulder   . Broken toes   . Cervicalgia   . CHF (congestive heart failure) (HCC) 07/2016  . CKD (chronic kidney disease) stage 3, GFR 30-59 ml/min   . Depression   . Diabetes mellitus without complication (HCC)   . Elevated liver enzymes Hep B/C neg 2014  . GERD (gastroesophageal reflux disease)   . Glaucoma   . History of alcohol use   . Hypertension   . Hypothyroidism   . Interstitial cystitis    bladder stretched every 9 months  . Migraines   . Psoriasis   . Stroke (HCC) 05/16/2016   Left occipital and thalamic, right hippocampal  . Thyroid disease   . Tobacco use   . 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 Vitamin B12 deficiency     Past Surgical History:  Procedure Laterality Date  . ABDOMINAL HYSTERECTOMY     with oophorectomy  . APPENDECTOMY    . BLADDER SURGERY    . CATARACT EXTRACTION    . CERVICAL DISC SURGERY    . CHOLECYSTECTOMY    . HERNIA REPAIR    . TONSILLECTOMY       reports that she has been smoking Cigarettes.  She has been smoking about 0.25 packs per day. She has never used smokeless tobacco. She reports that she does not drink alcohol or use drugs.  Allergies  Allergen Reactions  . Ciprofloxacin Hives  . Hydrocodone Nausea And Vomiting  . Codeine Diarrhea and Nausea And Vomiting  . Doxycycline Diarrhea and Nausea And Vomiting  . Omnicef [Cefdinir] Nausea Only and Other (See Comments)    Constipation, tolerated Zosyn  . Augmentin [Amoxicillin-Pot Clavulanate] Hives and Rash    Has patient had a PCN reaction causing immediate rash, facial/tongue/throat swelling, SOB or lightheadedness with hypotension: Yes Has patient had a PCN reaction causing severe rash involving mucus membranes or skin necrosis: Yes Did a PCN reaction that required hospitalization No Did PCN reaction occurring within the last 10 years: Yes If all of the above answers are "NO", then may proceed with Cephalosporin use.  Pt states she has taken penicillin since, and was ok with it.Marland Kitchen..Marland Kitchen  Family History  Problem Relation Age of Onset  . Alcohol abuse Mother   . Arthritis Mother   . Asthma Mother   . Cancer Mother     colon cancer  . Hypertension Mother   . Migraines Mother   . Stroke Mother   . Lung disease Mother   . COPD Mother   . Diabetes Father   . Hypertension Father   . Heart disease Father   . Heart attack Father   . Heart disease Paternal Grandmother   . Diabetes Paternal Grandmother   . Stroke Paternal Grandmother   . Cancer Paternal Grandmother   . Diabetes Paternal Grandfather      Prior to Admission medications   Medication Sig Start Date  End Date Taking? Authorizing Provider  atorvastatin (LIPITOR) 10 MG tablet Take 1 tablet (10 mg total) by mouth daily at 6 PM. 05/19/16  Yes Rolly Salter, MD  carvedilol (COREG) 3.125 MG tablet Take 1 tablet (3.125 mg total) by mouth 2 (two) times daily with a meal. 07/18/16  Yes Mauricio Annett Gula, MD  clopidogrel (PLAVIX) 75 MG tablet Take 75 mg by mouth daily.   Yes Historical Provider, MD  escitalopram (LEXAPRO) 20 MG tablet Take 20 mg by mouth daily.   Yes Historical Provider, MD  amLODipine (NORVASC) 5 MG tablet Take 5 mg by mouth daily.    Historical Provider, MD  aspirin EC 325 MG tablet Take 325 mg by mouth daily.    Historical Provider, MD  diazepam (VALIUM) 5 MG tablet Take 1 tablet (5 mg total) by mouth every 12 (twelve) hours as needed for anxiety. 08/30/16   Dorothea Ogle, MD  feeding supplement, GLUCERNA SHAKE, (GLUCERNA SHAKE) LIQD Take 237 mLs by mouth 3 (three) times daily between meals. Patient not taking: Reported on 06/22/2016 05/19/16   Rolly Salter, MD  HUMALOG 100 UNIT/ML injection Inject 5-10 Units as directed 4 (four) times daily -  with meals and at bedtime. 09/02/16   Historical Provider, MD  hydrocortisone (ANUSOL-HC) 2.5 % rectal cream Place rectally 4 (four) times daily. Patient not taking: Reported on 09/18/2016 05/19/16   Rolly Salter, MD  insulin glargine (LANTUS) 100 UNIT/ML injection Inject 0.05 mLs (5 Units total) into the skin daily. Patient taking differently: Inject 10 Units into the skin daily.  09/10/16   Alison Murray, MD  iron polysaccharides (NIFEREX) 150 MG capsule Take 1 capsule (150 mg total) by mouth 2 (two) times daily. Patient taking differently: Take 150 mg by mouth daily.  06/28/16   Vassie Loll, MD  levothyroxine (SYNTHROID, LEVOTHROID) 88 MCG tablet Take 88 mcg by mouth daily before breakfast.    Historical Provider, MD  LORazepam (ATIVAN) 0.5 MG tablet Take 1 tablet (0.5 mg total) by mouth every 8 (eight) hours as needed for anxiety. 05/19/16    Rolly Salter, MD  losartan (COZAAR) 100 MG tablet Take 100 mg by mouth daily.    Historical Provider, MD  meclizine (ANTIVERT) 25 MG tablet Take 1 tablet (25 mg total) by mouth 3 (three) times daily as needed for dizziness. 02/12/16   Chilton Si, MD  ondansetron (ZOFRAN) 4 MG tablet Take 1 tablet (4 mg total) by mouth every 6 (six) hours. 09/18/16   Maia Plan, MD  oxyCODONE-acetaminophen (PERCOCET/ROXICET) 5-325 MG tablet Take 2 tablets by mouth every 4 (four) hours as needed for severe pain. 09/18/16   Maia Plan, MD  polyethylene glycol [facility] / Ethelene Hal) packet Take 17  g by mouth every other day. Patient taking differently: Take 17 g by mouth daily as needed for mild constipation.  05/19/16   Rolly Salter, MD  senna-docusate (SENOKOT-S) 8.6-50 MG tablet Take 1 tablet by mouth daily. 09/18/16   Maia Plan, MD    Physical Exam: Vitals:   09/20/16 1433 09/20/16 1502 09/20/16 1530 09/20/16 1601  BP: (!) 115/51 (!) 102/54 (!) 108/52 112/63  Pulse: 115 118 118 115  Resp: 18 18 18 18   Temp:      TempSrc:      SpO2: 100% 100% 100% 100%  Weight:      Height:        Constitutional: NAD, calm, comfortable Eyes: PERTLA, lids and conjunctivae normal, no nystagmus, no icterus ENMT: Mucous membranes slightly dry, no erythema, no exudates, no thrush.   Neck: normal, supple, no masses, no thyromegaly, no JVD Respiratory: clear to auscultation bilaterally, no wheezing, no crackles. Normal respiratory effort. No accessory muscle use.  Cardiovascular: S1 & S2 heard, tachycardic, no murmurs / rubs / gallops. No extremity edema. 2+ pedal pulses. No carotid bruits.  Abdomen: No distension, mild diffuse tenderness with deep palpation, no masses palpated. No hepatosplenomegaly. Bowel sounds normal.  Musculoskeletal: no clubbing / cyanosis. No joint deformity upper and lower extremities. Good ROM, no contractures. Normal muscle tone.  Skin: no rashes, lesions, ulcers. No  induration Neurologic: CN 2-12 grossly intact. Sensation intact, DTR normal. Strength 4-5/5 in all 4 limbs due to poor effort Psychiatric: Normal judgment and insight. Alert and oriented x 3. Normal mood.    Labs on Admission: I have personally reviewed following labs and imaging studies  CBC:  Recent Labs Lab 09/18/16 1224 09/20/16 1233  WBC 8.9 13.2*  NEUTROABS  --  12.2*  HGB 11.0* 9.8*  HCT 33.0* 33.2*  MCV 91.7 102.5*  PLT 365 357   Basic Metabolic Panel:  Recent Labs Lab 09/18/16 1224 09/20/16 1233  NA 135 133*  K 3.7 6.0*  CL 106 101  CO2 18* 7*  GLUCOSE 234* 892*  BUN 18 30*  CREATININE 1.29* 2.17*  CALCIUM 8.2* 8.3*   GFR: Estimated Creatinine Clearance: 19.4 mL/min (by C-G formula based on SCr of 2.17 mg/dL (H)).   Liver Function Tests:  Recent Labs Lab 09/18/16 1224 09/20/16 1233  AST 25 24  ALT 28 32  ALKPHOS 80 95  BILITOT 0.2* 1.8*  PROT 6.1* 6.0*  ALBUMIN 3.0* 2.8*    Recent Labs Lab 09/18/16 1224 09/20/16 1233  LIPASE 123* 32   CBG:  Recent Labs Lab 09/18/16 1222 09/20/16 1356 09/20/16 1459 09/20/16 1557  GLUCAP 244* >600* >600* >600*   Urine analysis:    Component Value Date/Time   COLORURINE YELLOW 09/20/2016 1248   APPEARANCEUR CLOUDY (A) 09/20/2016 1248   APPEARANCEUR Clear 06/15/2014 1508   LABSPEC 1.025 09/20/2016 1248   LABSPEC 1.010 06/15/2014 1508   PHURINE 5.0 09/20/2016 1248   GLUCOSEU >1000 (A) 09/20/2016 1248   GLUCOSEU Negative 06/15/2014 1508   HGBUR TRACE (A) 09/20/2016 1248   BILIRUBINUR NEGATIVE 09/20/2016 1248   BILIRUBINUR Negative 06/15/2014 1508   KETONESUR 15 (A) 09/20/2016 1248   PROTEINUR >300 (A) 09/20/2016 1248   UROBILINOGEN 0.2 09/14/2015 1617   NITRITE NEGATIVE 09/20/2016 1248   LEUKOCYTESUR NEGATIVE 09/20/2016 1248   LEUKOCYTESUR Negative 06/15/2014 1508   Radiological Exams on Admission: No results found.  EKG: no available at the moment of admission.   Assessment/Plan 1-DKA:   -anion gap 28, CBG  892, bicarb 7 -will start treatment in stepdown following DKA protocol -will check EKG, troponin, UDS, ETOH, UA and CXR -per recent hx appears to be associated with medication non compliance  -will check A1C -patient will be NPO  2-hypothyroidism -continue synthroid   3-hyperkalemia: -mild -will check EKG -patient will be on insulin drip -admitted to stepdown where constant telemetry monitoring will be provided  4-hx of CVA: -no new deficit appreciated During this hospitalization -will continue plavix and ASA for secondary prevention  5-HLD: -will continue statins  6-HTN: -will continue Norvasc and Coreg -will monitor BP and adjust antihypertensive regimen as needed   7-Depression/anxiety: -will continue lexapro and PRN ativan -no SI or hallucinations reported   8-hx of tobacco abuse and alcohol abuse -cessation counseling provided for tobacco -patient reports multiple months since her last alcohol drink -will check ETOH -nicotine patch ordered  9-AOCD -no overt bleeding appreciated or reproted -will follow Hgb trend -continue niferex  10-GERD: -will continue PPI  11-acute on chronic renal failure: stage 3 at baseline -due to pre-renal azotemia and dehydration from hyperglycemia/DKA and GI loses (intractablenausea/vomting) -will provide IVF's -will check UA -will follow renal function trend  minimize nephrotoxic agents   12-physical deconditioning -will ask PT/OT to assess for capability performing ADL's -per husband he feels she will benefit of short term SNF placement for rehab.  11-dizziness: -Will continue the use of meclizine as needed -PT/OT to be ordered once stable  Time: 70 minutes  DVT prophylaxis: heparin  Code Status: Full Family Communication: husband family at bedside   Disposition Plan: to be determine; will assess capacity to perform ADL's (PT/OT orders once stable); patient's husband feeling patient will  benefit of short term rehab at [facility].  Consults called: none   Admission status: inpatient, stepdown, LOS > 2 midnights     Vassie Loll MD [facility] Pager [phone]  If 7PM-7AM, please contact night-coverage [email or web address] Password [email or web address]  09/20/2016, 5:07 PM

## 2016-09-20 NOTE — Progress Notes (Signed)
CRITICAL VALUE ALERT  Critical value received:  Troponin 0.07  Date of notification:  09/20/2016  Time of notification:  1930 pm  Critical value read back: yes  Nurse who received alert:  Conley RollsGarland, Zachari Alberta Lavern, RN  MD notified (1st page):  [facility]  Time of first page:  1948 pm  MD notified (2nd page):  Time of second page:  Responding MD:  [facility]  Time MD responded:  1950

## 2016-09-20 NOTE — Progress Notes (Addendum)
EDCM went to speak to patient at bedside.  [facility] to speak to her husband at bedside as she is not feeling well. EDCM noted patient to have 6 admissions within six months.  Patient's husband reports patient sometimes forgets to take her insulin.  He reports she doubts she is checking her blood sugar four times a day.  He reports patient's pcp is Dr. Abigail Miyamotohacker and patient has just seen her pcp on Wednesday this week.  He reports patient's pcp wanted her to come to the ED because he thought she may have pancreatitis.  Husband reports he takes patient to her appointments.  Patient's husband reports patient has a Charity fundraiserN from [employer].  Husband reports he feels that patient needs to be in an environment where the patient can receive her insulin and her meals regularly.  He reports [place] [facility] [place] [facility] [place]  He reports when patient was discharged from [facility], patient went back to being noncompliant.  Patient's husband reports no difficulty affording insulin.  He reports the patient hasn't had a drink (ETOH), in a couple of weeks and the patient smokes per husband.  Patient's husband reports the patient does not have an endocrinologist.  [facility] [employer] who reports patient is active for RN services.  No further EDCM needs at this time.  [facility].

## 2016-09-21 DIAGNOSIS — E875 Hyperkalemia: Secondary | ICD-10-CM

## 2016-09-21 LAB — BASIC METABOLIC PANEL
ANION GAP: 9 (ref 5–15)
Anion gap: 5 (ref 5–15)
BUN: 29 mg/dL — ABNORMAL HIGH (ref 6–20)
BUN: 28 mg/dL — ABNORMAL HIGH (ref 6–20)
CALCIUM: 7.5 mg/dL — AB (ref 8.9–10.3)
CHLORIDE: 111 mmol/L (ref 101–111)
CO2: 20 mmol/L — AB (ref 22–32)
CO2: 20 mmol/L — ABNORMAL LOW (ref 22–32)
CREATININE: 1.95 mg/dL — AB (ref 0.44–1.00)
Calcium: 7.5 mg/dL — ABNORMAL LOW (ref 8.9–10.3)
Chloride: 115 mmol/L — ABNORMAL HIGH (ref 101–111)
Creatinine, Ser: 1.64 mg/dL — ABNORMAL HIGH (ref 0.44–1.00)
GFR calc Af Amer: 36 mL/min — ABNORMAL LOW (ref >60)
GFR calc non Af Amer: 25 mL/min — ABNORMAL LOW (ref >60)
GFR, EST AFRICAN AMERICAN: 29 mL/min — AB (ref >60)
GFR, EST NON AFRICAN AMERICAN: 31 mL/min — AB (ref >60)
Glucose, Bld: 75 mg/dL (ref 65–99)
Glucose, Bld: 98 mg/dL (ref 65–99)
POTASSIUM: 4.4 mmol/L (ref 3.5–5.1)
Potassium: 3.5 mmol/L (ref 3.5–5.1)
Sodium: 140 mmol/L (ref 135–145)
Sodium: 140 mmol/L (ref 135–145)

## 2016-09-21 LAB — GLUCOSE, CAPILLARY
GLUCOSE-CAPILLARY: 106 mg/dL — AB (ref 65–99)
GLUCOSE-CAPILLARY: 118 mg/dL — AB (ref 65–99)
GLUCOSE-CAPILLARY: 159 mg/dL — AB (ref 65–99)
GLUCOSE-CAPILLARY: 224 mg/dL — AB (ref 65–99)
GLUCOSE-CAPILLARY: 64 mg/dL — AB (ref 65–99)
GLUCOSE-CAPILLARY: 70 mg/dL (ref 65–99)
GLUCOSE-CAPILLARY: 76 mg/dL (ref 65–99)
Glucose-Capillary: 101 mg/dL — ABNORMAL HIGH (ref 65–99)
Glucose-Capillary: 49 mg/dL — ABNORMAL LOW (ref 65–99)
Glucose-Capillary: 83 mg/dL (ref 65–99)

## 2016-09-21 LAB — TROPONIN I

## 2016-09-21 LAB — HEMOGLOBIN A1C
HEMOGLOBIN A1C: 9.2 % — AB (ref 4.8–5.6)
Mean Plasma Glucose: 217 mg/dL

## 2016-09-21 MED ORDER — INSULIN ASPART 100 UNIT/ML ~~LOC~~ SOLN: 0.0000 [IU] | SUBCUTANEOUS | Status: DC

## 2016-09-21 MED ORDER — DEXTROSE 50 % IV SOLN
12.0000 mL | Freq: Once | INTRAVENOUS | Status: AC
  Administered 2016-09-21: 12 mL via INTRAVENOUS

## 2016-09-21 MED ORDER — DEXTROSE 50 % IV SOLN
INTRAVENOUS | Status: AC
  Administered 2016-09-21: 12 mL
  Filled 2016-09-21: qty 50

## 2016-09-21 MED ORDER — INSULIN ASPART 100 UNIT/ML ~~LOC~~ SOLN
0.0000 [IU] | Freq: Three times a day (TID) | SUBCUTANEOUS | Status: DC
  Administered 2016-09-21: 2 [IU] via SUBCUTANEOUS
  Administered 2016-09-21: 3 [IU] via SUBCUTANEOUS
  Administered 2016-09-22: 2 [IU] via SUBCUTANEOUS
  Administered 2016-09-22: 5 [IU] via SUBCUTANEOUS
  Administered 2016-09-23: 2 [IU] via SUBCUTANEOUS

## 2016-09-21 MED ORDER — ENSURE ENLIVE PO LIQD
237.0000 mL | Freq: Two times a day (BID) | ORAL | Status: DC | PRN
  Administered 2016-09-21: 237 mL via ORAL
  Filled 2016-09-21: qty 237

## 2016-09-21 MED ORDER — INSULIN GLARGINE 100 UNIT/ML ~~LOC~~ SOLN
8.0000 [IU] | Freq: Every day | SUBCUTANEOUS | Status: DC
  Administered 2016-09-21 – 2016-09-23 (×3): 8 [IU] via SUBCUTANEOUS
  Filled 2016-09-21 (×3): qty 0.08

## 2016-09-21 MED ORDER — POTASSIUM CHLORIDE CRYS ER 20 MEQ PO TBCR
30.0000 meq | EXTENDED_RELEASE_TABLET | Freq: Once | ORAL | Status: AC
  Administered 2016-09-21: 30 meq via ORAL
  Filled 2016-09-21: qty 1

## 2016-09-21 MED ORDER — SODIUM CHLORIDE 0.9 % IV SOLN
INTRAVENOUS | Status: DC
  Administered 2016-09-21: 1000 mL via INTRAVENOUS
  Administered 2016-09-21: 21:00:00 via INTRAVENOUS

## 2016-09-21 NOTE — Progress Notes (Signed)
[facility] PROGRESS NOTE  Cassandra AbbeBillie A [name] [id number] DOB: 11/01/1946 DOA: 09/20/2016 PCP: Irving CopasHACKER,ROBERT KELLER, MD  Interim summary and HPI 70 y.o. female with PMH significant for DM1, HTN, CKD3, hypothyroidism, GERD, depression, anxiety, hx of CVA and recent admission for DKA in the setting of medical non compliance (9/22--9/27); who presented to ED with complaints of nausea, vomiting, inability to keep things down and her glucometer reading high.She denies fevers, chills, no specific sick contacts or exposures, no CP, no SOB and no dysuria. Pt also denies blood in vomitus and denies any diarrhea.  ED Course: in ED was found to have DKA, gap 28, acute on chronic renal failure, mild hyperkalemia and CBG > 800. Patient received IVF's bolus, was started on insulin drip and [facility] was called to admit patient for further evaluation and treatment.   **Patient brittle diabetes; will watch and adjust hypoglycemic regimen as needed   Assessment/Plan: 1-DKA:  -anion gap 10, CBG < 200 in more than 5 occasions now; bicarb 20 -will transition off insulin drip; starting lantus and SSI -will advance diet to modified carbohydrates  -given hx of poor PO intake, will add glucerna for supplementation (especially if patient fail to eat > 50% of her tray) -EKG w/o acute ischemic changes; mild troponin elevation initially probably from AKI. -no signs of infection in her urine and/or CXR -UDS positive for benzos only; ETOH < 5 -per recent hx appears to be associated with medication non compliance  -will follow A1C  2-hypothyroidism -continue synthroid   3-hyperkalemia: -resolved now -no EKG changes seen when potassium was elevated -currently no telemetry needed   4-hx of CVA: -no new deficit appreciated During this hospitalization -will continue plavix and ASA for secondary prevention  5-HLD: -will continue statins  6-HTN: -will continue Norvasc and Coreg -will monitor BP and  adjust antihypertensive regimen as needed   7-Depression/anxiety: -will continue lexapro and PRN ativan -no SI or hallucinations reported   8-hx of tobacco abuse and alcohol abuse -cessation counseling provided for tobacco -patient reports multiple months since her last alcohol drink -ETOH < 5 -nicotine patch ordered  9-AOCD -no overt bleeding appreciated or reproted -will follow Hgb trend -continue niferex  10-GERD: -will continue PPI  11-acute on chronic renal failure: stage 3 at baseline -due to pre-renal azotemia and dehydration from hyperglycemia/DKA and GI loses (intractablenausea/vomting) -will continue IVF's (rate adjusted) -UA w/o signs of infection  -will follow renal function trend; has improved appropriately   -continue to minimize use of nephrotoxic agents   12-physical deconditioning -will ask PT/OT to assess for capability performing ADL's -per husband he feels she will benefit of short term SNF placement for rehab.  11-dizziness: -Will continue the use of meclizine as needed -PT/OT ordered   Code Status: Full Family Communication: no family at bedside  Disposition Plan: will continue IVF's (at adjusted rate), will transition off insulin drip, transfer to med-surg bed and advance diet.   Consultants:  None   Procedures:  See below for x-ray reports   Antibiotics:  None   HPI/Subjective: Afebrile, no CP, no SOB. Patient is feeling weak and tired. Hungry and thirsty currently. No further episodes of nausea/vomiting.  Objective: Vitals:   09/21/16 0600 09/21/16 0800  BP: (!) 134/54   Pulse: 79   Resp: 12   Temp:  98.7 F (37.1 C)    Intake/Output Summary (Last 24 hours) at 09/21/16 0839 Last data filed at 09/21/16 0600  Gross per 24 hour  Intake  3103.42 ml  Output              150 ml  Net          2953.42 ml   Filed Weights   09/20/16 0916 09/20/16 1700  Weight: 51.7 kg (114 lb) 51.2 kg (112 lb 14 oz)     Exam:   General: afebrile, no CP, no nausea, no vomiting. Patient is AAOX3 and able to follow commands. Reports being thirsty and hungry.  Cardiovascular: S1 and S2, no rubs or gallops, no JVD  Respiratory: CTA bilaterally   Abdomen: soft, NT, ND, positive BS, no guarding   Musculoskeletal: no edema, no cyanosis   Data Reviewed: Basic Metabolic Panel:  Recent Labs Lab 09/20/16 1233 09/20/16 1830 09/20/16 2220 09/21/16 0136 09/21/16 0657  NA 133* 136 139 140 140  K 6.0* 3.7 3.3* 3.5 4.4  CL 101 107 114* 115* 111  CO2 7* 20* 14* 20* 20*  GLUCOSE 892* 147* 200* 75 98  BUN 30* 58* 30* 29* 28*  CREATININE 2.17* 3.23* 2.15* 1.95* 1.64*  CALCIUM 8.3* 8.6* 7.6* 7.5* 7.5*  MG  --  1.6*  --   --   --   PHOS  --  4.0  --   --   --    Liver Function Tests:  Recent Labs Lab 09/18/16 1224 09/20/16 1233  AST 25 24  ALT 28 32  ALKPHOS 80 95  BILITOT 0.2* 1.8*  PROT 6.1* 6.0*  ALBUMIN 3.0* 2.8*    Recent Labs Lab 09/18/16 1224 09/20/16 1233  LIPASE 123* 32   CBC:  Recent Labs Lab 09/18/16 1224 09/20/16 1233  WBC 8.9 13.2*  NEUTROABS  --  12.2*  HGB 11.0* 9.8*  HCT 33.0* 33.2*  MCV 91.7 102.5*  PLT 365 357   Cardiac Enzymes:  Recent Labs Lab 09/20/16 1830 09/21/16 0136 09/21/16 0657  TROPONINI 0.07* <0.03 <0.03   BNP (last 3 results)  Recent Labs  07/11/16 1600 09/06/16 1215 09/18/16 1224  BNP 469.1* 187.2* 196.0*   CBG:  Recent Labs Lab 09/21/16 0112 09/21/16 0221 09/21/16 0244 09/21/16 0329 09/21/16 0803  GLUCAP 101* 49* 64* 76 159*    Recent Results (from the past 240 hour(s))  MRSA PCR Screening     Status: None   Collection Time: 09/20/16  4:40 PM  Result Value Ref Range Status   MRSA by PCR NEGATIVE NEGATIVE Final    Comment:        The GeneXpert MRSA Assay (FDA approved for NASAL specimens only), is one component of a comprehensive MRSA colonization surveillance program. It is not intended to diagnose  MRSA infection nor to guide or monitor treatment for MRSA infections.      Studies: Portable Chest X-ray (1 View)  Result Date: 09/20/2016 CLINICAL DATA:  Diabetic ketoacidosis.  Shortness of breath. EXAM: PORTABLE CHEST 1 VIEW COMPARISON:  09/18/2016 FINDINGS: Shallow inspiration. Heart size and pulmonary vascularity are normal. No focal airspace disease or consolidation in the lungs. No blunting of costophrenic angles. No pneumothorax. Mediastinal contours appear intact. Postoperative changes in the cervical spine. Degenerative changes in the thoracic spine and shoulders. IMPRESSION: No active disease. Electronically Signed   By: Burman Nieves M.D.   On: 09/20/2016 19:43    Scheduled Meds: . amLODipine  5 mg Oral Daily  . aspirin  81 mg Oral Daily  . atorvastatin  10 mg Oral q1800  . carvedilol  3.125 mg Oral BID WC  . clopidogrel  75  mg Oral Daily  . escitalopram  20 mg Oral Daily  . heparin  5,000 Units Subcutaneous Q8H  . insulin aspart  0-9 Units Subcutaneous TID WC  . insulin glargine  8 Units Subcutaneous Daily  . iron polysaccharides  150 mg Oral BID  . levothyroxine  88 mcg Oral QAC breakfast  . nicotine  14 mg Transdermal Daily  . pantoprazole  40 mg Oral Q1200   Continuous Infusions: . sodium chloride      Principal Problem:   Diabetic ketoacidosis without coma associated with type 1 diabetes mellitus (HCC) Active Problems:   Type 1 diabetes mellitus with renal complications (HCC)   Hypertension   Hypothyroidism   HLD (hyperlipidemia)   Anemia of chronic disease   Acute renal failure superimposed on stage 3 chronic kidney disease (HCC)   DKA, type 1 (HCC)   Non-intractable vomiting   Gastroesophageal reflux disease   H/O: CVA (cerebrovascular accident)   Hyperkalemia    Time spent: 30 minutes    Vassie Loll  [facility] Pager [phone]. If 7PM-7AM, please contact night-coverage at [email or web address], password [facility] 09/21/2016, 8:39 AM  LOS: 1  day

## 2016-09-21 NOTE — Progress Notes (Signed)
Assumed care of patient, agree with previous RN assessment.  Joshlynn Alfonzo M. Quandre Polinski, RN  

## 2016-09-21 NOTE — Progress Notes (Signed)
OT Cancellation Note  Patient Details Name: Merril AbbeBillie A Miley MRN: [id number] DOB: 09/05/1946   Cancelled Treatment:    Reason Eval/Treat Not Completed: Other (comment).  Pt states she feels terrible (diarrhea/nausea). Will check back another day.  Kynadee Dam 09/21/2016, 3:19 PM  Marica OtterMaryellen Monicia Tse, OTR/L [phone] 09/21/2016

## 2016-09-22 DIAGNOSIS — E1022 Type 1 diabetes mellitus with diabetic chronic kidney disease: Secondary | ICD-10-CM

## 2016-09-22 DIAGNOSIS — R1111 Vomiting without nausea: Secondary | ICD-10-CM

## 2016-09-22 LAB — BASIC METABOLIC PANEL
Anion gap: 7 (ref 5–15)
BUN: 24 mg/dL — ABNORMAL HIGH (ref 6–20)
CHLORIDE: 112 mmol/L — AB (ref 101–111)
CO2: 21 mmol/L — ABNORMAL LOW (ref 22–32)
Calcium: 7.7 mg/dL — ABNORMAL LOW (ref 8.9–10.3)
Creatinine, Ser: 1.46 mg/dL — ABNORMAL HIGH (ref 0.44–1.00)
GFR calc non Af Amer: 36 mL/min — ABNORMAL LOW (ref >60)
GFR, EST AFRICAN AMERICAN: 41 mL/min — AB (ref >60)
Glucose, Bld: 73 mg/dL (ref 65–99)
POTASSIUM: 4 mmol/L (ref 3.5–5.1)
SODIUM: 140 mmol/L (ref 135–145)

## 2016-09-22 LAB — GLUCOSE, CAPILLARY
GLUCOSE-CAPILLARY: 119 mg/dL — AB (ref 65–99)
GLUCOSE-CAPILLARY: 288 mg/dL — AB (ref 65–99)
GLUCOSE-CAPILLARY: 79 mg/dL (ref 65–99)
Glucose-Capillary: 167 mg/dL — ABNORMAL HIGH (ref 65–99)

## 2016-09-22 MED ORDER — MECLIZINE HCL 25 MG PO TABS
25.0000 mg | ORAL_TABLET | Freq: Three times a day (TID) | ORAL | Status: DC | PRN
  Administered 2016-09-22: 25 mg via ORAL
  Filled 2016-09-22 (×4): qty 1

## 2016-09-22 MED ORDER — GI COCKTAIL ~~LOC~~
30.0000 mL | Freq: Once | ORAL | Status: AC
  Administered 2016-09-22: 30 mL via ORAL
  Filled 2016-09-22: qty 30

## 2016-09-22 NOTE — Evaluation (Signed)
Occupational Therapy Evaluation Patient Details Name: Cassandra [name] MRN: [id number] DOB: 03-14-1946 Today's Date: 09/22/2016    History of Present Illness 70 y.o. female admitted 09/20/16 with dizziness, vomiting. Dx of DKA, pancreatitis. 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   Clinical Impression   Pt was admitted for the above.  She was mod I with adls prior to admission and will benefit from continued OT.  Goals in acute setting are for supervision level    Follow Up Recommendations  SNF (if pt goes home, 24/7 and HHOT)    Equipment Recommendations  None recommended by OT    Recommendations for Other Services       Precautions / Restrictions Precautions Precautions: Fall Precaution Comments: 4 falls in past 1 year Restrictions Weight Bearing Restrictions: No      Mobility Bed Mobility   Bed Mobility: Supine to Sit;Sit to Supine     Supine to sit: Min assist Sit to supine: Min guard   General bed mobility comments: Min A to raise trunk  Transfers Overall transfer level: Needs assistance Equipment used: Rolling walker (2 wheeled) Transfers: Sit to/from Stand Sit to Stand: Min assist         General transfer comment: steady assist  upon standing    Balance Overall balance assessment: Needs assistance Sitting-balance support: No upper extremity supported;Feet supported Sitting balance-Leahy Scale: Fair     Standing balance support: During functional activity;Bilateral upper extremity supported Standing balance-Leahy Scale: Fair                              ADL Overall ADL's : Needs assistance/impaired             Lower Body Bathing: Minimal assistance;Sit to/from stand       Lower Body Dressing: Minimal assistance;Sit to/from stand   Toilet Transfer: Minimal assistance;Ambulation;RW (back to bed:  did not need to use  commode)             General ADL Comments: pt can perform UB adls with set up. She is feeling off balance. No longer has spinning (and this has been a long standing problem for her for which ENT gives her meclizine).  Pt has vision issues:  she has a dysconjugate gaze and reports shadowing. Also, she complains of blank white in background.  (Pt has h/o CVA and hit head when she fell in the shower awhile ago)  Pt has been trying to get into eye doctor's but keeping ending up in hospital.  Pt states she wants to increase strength. Encouraged her to sit up in chair several times a day, at least during meals     Vision Additional Comments: dysconjugate gaze   Perception     Praxis      Pertinent Vitals/Pain Pain Assessment: No/denies pain     Hand Dominance     Extremity/Trunk Assessment Upper Extremity Assessment Upper Extremity Assessment: Overall WFL for tasks assessed      Cervical / Trunk Assessment Cervical / Trunk Assessment: Normal   Communication Communication Communication: No difficulties   Cognition Arousal/Alertness: Awake/alert Behavior During Therapy: WFL for tasks assessed/performed Overall Cognitive Status: Impaired/Different from baseline Area of Impairment: Orientation Orientation Level: Time                 General Comments       Exercises  Shoulder Instructions      Home Living Family/patient expects to be discharged to:: Private residence Living Arrangements: Spouse/significant other Available Help at Discharge: Family;Available 24 hours/day Type of Home: Apartment Home Access: Level entry     Home Layout: One level     Bathroom Shower/Tub: Tub/shower unit Shower/tub characteristics: Curtain Firefighter: Standard     Home Equipment: Environmental consultant - 2 wheels;Shower seat   Additional Comments: Husband does all cooking, cleaning and laundry as well as grocery shopping.       Prior Functioning/Environment Level of  Independence: Needs assistance  Gait / Transfers Assistance Needed: walks with RW in house     Comments: did not need any assistance with adls        OT Problem List: Decreased strength;Impaired balance (sitting and/or standing);Decreased activity tolerance;Impaired vision/perception   OT Treatment/Interventions: Self-care/ADL training;DME and/or AE instruction;Therapeutic activities;Visual/perceptual remediation/compensation;Patient/family education;Balance training    OT Goals(Current goals can be found in the care plan section) Acute Rehab OT Goals Patient Stated Goal: get strength back; rehab helped last time OT Goal Formulation: With patient Time For Goal Achievement: 09/29/16 Potential to Achieve Goals: Good ADL Goals Pt Will Perform Grooming: with supervision;standing Pt Will Perform Lower Body Bathing: with supervision;sit to/from stand Pt Will Perform Lower Body Dressing: with supervision;sit to/from stand Pt Will Transfer to Toilet: with supervision;bedside commode;ambulating Additional ADL Goal #1: pt will report single vision with taped glasses  OT Frequency: Min 2X/week   Barriers to D/C:            Co-evaluation   Reason for Co-Treatment: For patient/therapist safety PT goals addressed during session: Mobility/safety with mobility OT goals addressed during session: ADL's and self-care      End of Session    Activity Tolerance: Patient tolerated treatment well Patient left: in bed;with call bell/phone within reach;with bed alarm set   Time: 1610-9604 OT Time Calculation (min): 17 min Charges:  OT General Charges $OT Visit: 1 Procedure OT Evaluation $OT Eval Low Complexity: 1 Procedure G-Codes:    Antonella Upson 10/05/16, 12:38 PM  Marica Otter, OTR/L [phone] 05-Oct-2016

## 2016-09-22 NOTE — Progress Notes (Signed)
[facility] PROGRESS NOTE  Cassandra [name] WUJ:[id number] DOB: 05-Dec-1946 DOA: 09/20/2016 PCP: Irving Copas, MD  Interim summary and HPI 70 y.o. female with PMH significant for DM1, HTN, CKD3, hypothyroidism, GERD, depression, anxiety, hx of CVA and recent admission for DKA in the setting of medical non compliance (9/22--9/27); who presented to ED with complaints of nausea, vomiting, inability to keep things down and her glucometer reading high.She denies fevers, chills, no specific sick contacts or exposures, no CP, no SOB and no dysuria. Pt also denies blood in vomitus and denies any diarrhea.  ED Course: in ED was found to have DKA, gap 28, acute on chronic renal failure, mild hyperkalemia and CBG > 800. Patient received IVF's bolus, was started on insulin drip and [facility] was called to admit patient for further evaluation and treatment.   **Patient brittle diabetes; will watch and adjust hypoglycemic regimen as needed   Assessment/Plan: 1-DKA:  -anion gap 11, CBG < 200 in more than 8 occasions now; bicarb 21 -will continue Lantus and SSI -will continue diet to modified carbohydrates  -given hx of poor PO intake, will continue glucerna for supplementation (especially if patient fail to eat > 50% of her tray) -EKG w/o acute ischemic changes; mild troponin elevation initially probably from AKI. -no signs of infection in her urine and/or CXR -UDS positive for benzos only; ETOH < 5 -per recent hx appears to be associated with medication non compliance  -A1C 9.2  2-hypothyroidism -continue synthroid   3-hyperkalemia: -resolved now -no EKG changes seen when potassium was elevated -currently no telemetry needed   4-hx of CVA: -no new deficit appreciated During this hospitalization -will continue plavix and ASA for secondary prevention  5-HLD: -will continue statins  6-HTN: -will continue Coreg -will monitor BP and adjust antihypertensive regimen as needed   -soft yesterday; will cut off norvasc currently.  7-Depression/anxiety: -will continue lexapro and PRN ativan -no SI or hallucinations reported   8-hx of tobacco abuse and alcohol abuse -cessation counseling provided for tobacco -patient reports multiple months since her last alcohol drink -ETOH < 5 -nicotine patch ordered  9-AOCD -no overt bleeding appreciated or reproted -will follow Hgb trend -continue niferex  10-GERD: -will continue PPI  11-acute on chronic renal failure: stage 3 at baseline -due to pre-renal azotemia and dehydration from hyperglycemia/DKA and GI loses (intractablenausea/vomting) -will continue IVF's (rate adjusted) -UA w/o signs of infection  -will follow renal function trend; Cr continue to improved appropriately   -continue to minimize use of nephrotoxic agents   12-physical deconditioning -will ask PT/OT to assess for capability performing ADL's; per evaluation results, recommending HH services currently -per husband he feels she will benefit of short term SNF placement for rehab.  11-dizziness: -Will continue the use of meclizine as needed -PT/OT ordered and recommending HH services at discharge  Code Status: Full Family Communication: no family at bedside  Disposition Plan: will continue IVF's (at adjusted rate), will continue adjusting her hypoglycemic regimen and encourage PO intake. Most likely discharge in am.    Consultants:  None   Procedures:  See below for x-ray reports   Antibiotics:  None   HPI/Subjective: Afebrile, no CP, no SOB. Patient is feeling weak and tired. Appetite is poor as per staff results. Patient endorses being dizzy  Objective: Vitals:   09/22/16 0531 09/22/16 0853  BP: (!) 152/66 (!) 145/56  Pulse: 81 81  Resp: 16 16  Temp: 98.1 F (36.7 C) 98.5 F (36.9 C)    Intake/Output  Summary (Last 24 hours) at 09/22/16 1247 Last data filed at 09/22/16 0934  Gross per 24 hour  Intake           1367.92 ml  Output              300 ml  Net          1067.92 ml   Filed Weights   09/20/16 0916 09/20/16 1700 09/21/16 1728  Weight: 51.7 kg (114 lb) 51.2 kg (112 lb 14 oz) 53.9 kg (118 lb 14.4 oz)    Exam:   General: afebrile, no CP, no nausea, no vomiting. Patient is AAOX3 and able to follow commands properly. Appetite is not the best and patient is feeling dizzy this morning. She endorses being weak.  Cardiovascular: S1 and S2, no rubs or gallops, no JVD  Respiratory: CTA bilaterally   Abdomen: soft, NT, ND, positive BS, no guarding   Musculoskeletal: no edema, no cyanosis   Data Reviewed: Basic Metabolic Panel:  Recent Labs Lab 09/20/16 1830 09/20/16 2220 09/21/16 0136 09/21/16 0657 09/22/16 0523  NA 136 139 140 140 140  K 3.7 3.3* 3.5 4.4 4.0  CL 107 114* 115* 111 112*  CO2 20* 14* 20* 20* 21*  GLUCOSE 147* 200* 75 98 73  BUN 58* 30* 29* 28* 24*  CREATININE 3.23* 2.15* 1.95* 1.64* 1.46*  CALCIUM 8.6* 7.6* 7.5* 7.5* 7.7*  MG 1.6*  --   --   --   --   PHOS 4.0  --   --   --   --    Liver Function Tests:  Recent Labs Lab 09/18/16 1224 09/20/16 1233  AST 25 24  ALT 28 32  ALKPHOS 80 95  BILITOT 0.2* 1.8*  PROT 6.1* 6.0*  ALBUMIN 3.0* 2.8*    Recent Labs Lab 09/18/16 1224 09/20/16 1233  LIPASE 123* 32   CBC:  Recent Labs Lab 09/18/16 1224 09/20/16 1233  WBC 8.9 13.2*  NEUTROABS  --  12.2*  HGB 11.0* 9.8*  HCT 33.0* 33.2*  MCV 91.7 102.5*  PLT 365 357   Cardiac Enzymes:  Recent Labs Lab 09/20/16 1830 09/21/16 0136 09/21/16 0657  TROPONINI 0.07* <0.03 <0.03   BNP (last 3 results)  Recent Labs  07/11/16 1600 09/06/16 1215 09/18/16 1224  BNP 469.1* 187.2* 196.0*   CBG:  Recent Labs Lab 09/21/16 1306 09/21/16 1558 09/21/16 2149 09/22/16 0724 09/22/16 1141  GLUCAP 106* 224* 83 119* 288*    Recent Results (from the past 240 hour(s))  MRSA PCR Screening     Status: None   Collection Time: 09/20/16  4:40 PM  Result  Value Ref Range Status   MRSA by PCR NEGATIVE NEGATIVE Final    Comment:        The GeneXpert MRSA Assay (FDA approved for NASAL specimens only), is one component of a comprehensive MRSA colonization surveillance program. It is not intended to diagnose MRSA infection nor to guide or monitor treatment for MRSA infections.      Studies: Portable Chest X-ray (1 View)  Result Date: 09/20/2016 CLINICAL DATA:  Diabetic ketoacidosis.  Shortness of breath. EXAM: PORTABLE CHEST 1 VIEW COMPARISON:  09/18/2016 FINDINGS: Shallow inspiration. Heart size and pulmonary vascularity are normal. No focal airspace disease or consolidation in the lungs. No blunting of costophrenic angles. No pneumothorax. Mediastinal contours appear intact. Postoperative changes in the cervical spine. Degenerative changes in the thoracic spine and shoulders. IMPRESSION: No active disease. Electronically Signed   By: Chrissie Noa  Andria MeuseStevens M.D.   On: 09/20/2016 19:43    Scheduled Meds: . aspirin  81 mg Oral Daily  . atorvastatin  10 mg Oral q1800  . carvedilol  3.125 mg Oral BID WC  . clopidogrel  75 mg Oral Daily  . escitalopram  20 mg Oral Daily  . heparin  5,000 Units Subcutaneous Q8H  . insulin aspart  0-9 Units Subcutaneous TID WC  . insulin glargine  8 Units Subcutaneous Daily  . iron polysaccharides  150 mg Oral BID  . levothyroxine  88 mcg Oral QAC breakfast  . nicotine  14 mg Transdermal Daily  . pantoprazole  40 mg Oral Q1200   Continuous Infusions: . 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 If 7PM-7AM, please contact night-coverage at [email or web address], password [email or web address] 09/22/2016, 12:47 PM  LOS: 2 days

## 2016-09-22 NOTE — Evaluation (Signed)
Physical Therapy Evaluation Patient Details Name: Cassandra [name] MRN: [id number] DOB: 1946/02/26 Today's Date: 09/22/2016   History of Present Illness  70 y.o. female admitted 09/20/16 with dizziness, vomiting. Dx of DKA, pancreatitis. 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  Clinical Impression  The patient reports visual impairments affecting her balance. She is interested in short term rehab after DC. Pt admitted with above diagnosis. Pt currently with functional limitations due to the deficits listed below (see PT Problem List).  Pt will benefit from skilled PT to increase their independence and safety with mobility to allow discharge to the venue listed below.     Follow Up Recommendations Home health PT    Equipment Recommendations  None recommended by PT    Recommendations for Other Services       Precautions / Restrictions Precautions Precautions: Fall Precaution Comments: 4 falls in past 1 year Restrictions Weight Bearing Restrictions: No      Mobility  Bed Mobility   Bed Mobility: Supine to Sit;Sit to Supine     Supine to sit: Min assist Sit to supine: Min guard   General bed mobility comments: Min A to raise trunk  Transfers Overall transfer level: Needs assistance Equipment used: Rolling walker (2 wheeled) Transfers: Sit to/from Stand Sit to Stand: Min assist         General transfer comment: steady assist  upon standing  Ambulation/Gait Ambulation/Gait assistance: Min assist Ambulation Distance (Feet): 80 Feet Assistive device: Rolling walker (2 wheeled) Gait Pattern/deviations: Step-through pattern     General Gait Details: steady assist for turns  Careers information officer    Modified Rankin (Stroke Patients Only)       Balance Overall balance assessment: Needs assistance Sitting-balance support: No upper extremity  supported;Feet supported Sitting balance-Leahy Scale: Fair     Standing balance support: During functional activity;Bilateral upper extremity supported Standing balance-Leahy Scale: Fair                               Pertinent Vitals/Pain Pain Assessment: No/denies pain    Home Living Family/patient expects to be discharged to:: Private residence Living Arrangements: Spouse/significant other Available Help at Discharge: Family;Available 24 hours/day Type of Home: Apartment Home Access: Level entry     Home Layout: One level Home Equipment: Walker - 2 wheels;Shower seat Additional Comments: Husband does all cooking, cleaning and laundry as well as grocery shopping.     Prior Function Level of Independence: Needs assistance   Gait / Transfers Assistance Needed: walks with RW in house     Comments: did not need any assistance with adls     Hand Dominance        Extremity/Trunk Assessment   Upper Extremity Assessment: Overall WFL for tasks assessed           Lower Extremity Assessment: Generalized weakness      Cervical / Trunk Assessment: Normal  Communication   Communication: No difficulties  Cognition Arousal/Alertness: Awake/alert Behavior During Therapy: WFL for tasks assessed/performed Overall Cognitive Status: Impaired/Different from baseline Area of Impairment: Orientation Orientation Level: Time                  General Comments      Exercises     Assessment/Plan    PT Assessment Patient needs continued PT  services  PT Problem List Decreased balance;Decreased activity tolerance          PT Treatment Interventions DME instruction;Gait training;Functional mobility training;Therapeutic activities;Patient/family education;Balance training    PT Goals (Current goals can be found in the Care Plan section)  Acute Rehab PT Goals Patient Stated Goal: get strength back; rehab helped last time PT Goal Formulation: With  patient Time For Goal Achievement: 10/06/16 Potential to Achieve Goals: Good    Frequency Min 3X/week   Barriers to discharge        Co-evaluation PT/OT/SLP Co-Evaluation/Treatment: Yes Reason for Co-Treatment: For patient/therapist safety PT goals addressed during session: Mobility/safety with mobility OT goals addressed during session: ADL's and self-care       End of Session Equipment Utilized During Treatment: Gait belt Activity Tolerance: Patient limited by fatigue Patient left: in bed;with call bell/phone within reach;with bed alarm set Nurse Communication: Mobility status         Time: [id number] PT Time Calculation (min) (ACUTE ONLY): 17 min   Charges:   PT Evaluation $PT Eval Low Complexity: 1 Procedure     PT G CodesRada Hay:        Nicholette Dolson Elizabeth 09/22/2016, 12:39 PM Blanchard KelchKaren Merilee Wible PT [phone]

## 2016-09-23 LAB — CBC
HCT: 23.3 % — ABNORMAL LOW (ref 36.0–46.0)
HEMOGLOBIN: 7.8 g/dL — AB (ref 12.0–15.0)
MCH: 30.8 pg (ref 26.0–34.0)
MCHC: 33.5 g/dL (ref 30.0–36.0)
MCV: 92.1 fL (ref 78.0–100.0)
Platelets: 206 10*3/uL (ref 150–400)
RBC: 2.53 MIL/uL — AB (ref 3.87–5.11)
RDW: 14.4 % (ref 11.5–15.5)
WBC: 6.7 10*3/uL (ref 4.0–10.5)

## 2016-09-23 LAB — GLUCOSE, CAPILLARY
GLUCOSE-CAPILLARY: 64 mg/dL — AB (ref 65–99)
Glucose-Capillary: 93 mg/dL (ref 65–99)
Glucose-Capillary: 166 mg/dL — ABNORMAL HIGH (ref 65–99)

## 2016-09-23 LAB — BASIC METABOLIC PANEL
ANION GAP: 5 (ref 5–15)
BUN: 21 mg/dL — AB (ref 6–20)
CHLORIDE: 111 mmol/L (ref 101–111)
CO2: 25 mmol/L (ref 22–32)
Calcium: 7.8 mg/dL — ABNORMAL LOW (ref 8.9–10.3)
Creatinine, Ser: 1.23 mg/dL — ABNORMAL HIGH (ref 0.44–1.00)
GFR calc Af Amer: 51 mL/min — ABNORMAL LOW (ref >60)
GFR, EST NON AFRICAN AMERICAN: 44 mL/min — AB (ref >60)
Glucose, Bld: 74 mg/dL (ref 65–99)
POTASSIUM: 4.1 mmol/L (ref 3.5–5.1)
SODIUM: 141 mmol/L (ref 135–145)

## 2016-09-23 MED ORDER — ASPIRIN 81 MG PO CHEW: 81.0000 mg | CHEWABLE_TABLET | Freq: Every day | ORAL | 1 refills | Status: DC

## 2016-09-23 MED ORDER — LOSARTAN POTASSIUM 25 MG PO TABS: 25.0000 mg | ORAL_TABLET | Freq: Every day | ORAL | 1 refills | Status: DC

## 2016-09-23 MED ORDER — INSULIN PEN NEEDLE 31G X 5 MM MISC: 2 refills | Status: DC

## 2016-09-23 MED ORDER — INSULIN GLARGINE 100 UNITS/ML SOLOSTAR PEN: 8.0000 [IU] | PEN_INJECTOR | Freq: Every day | SUBCUTANEOUS | 11 refills | Status: DC

## 2016-09-23 MED ORDER — INSULIN ASPART 100 UNIT/ML FLEXPEN
5.0000 [IU] | PEN_INJECTOR | Freq: Three times a day (TID) | SUBCUTANEOUS | 11 refills | Status: DC
Start: 2016-09-23 — End: 2016-09-23

## 2016-09-23 MED ORDER — HUMALOG 100 UNIT/ML ~~LOC~~ SOLN: 5.0000 [IU] | Freq: Three times a day (TID) | SUBCUTANEOUS | 11 refills | Status: DC

## 2016-09-23 MED ORDER — INSULIN GLARGINE 100 UNIT/ML ~~LOC~~ SOLN: 8.0000 [IU] | Freq: Every day | SUBCUTANEOUS | 11 refills | Status: DC

## 2016-09-23 MED ORDER — PANTOPRAZOLE SODIUM 40 MG PO TBEC: 40.0000 mg | DELAYED_RELEASE_TABLET | Freq: Every day | ORAL | 1 refills | Status: DC

## 2016-09-23 NOTE — Progress Notes (Signed)
Hypoglycemic Event  CBG: 64  Treatment: 15 GM carbohydrate snack  Symptoms: None  Follow-up CBG: [id number] CBG Result:93  Possible Reasons for Event: Unknown  Comments/MD notified:hypoglycemic protocol    Cassandra SpiceEdwards, Cassandra [name] Coward

## 2016-09-23 NOTE — Discharge Summary (Addendum)
Physician Discharge Summary  Cassandra [name]:[id number] DOB: 12-07-1946 DOA: 09/20/2016  PCP: Irving Copas, MD  Admit date: 09/20/2016 Discharge date: 09/23/2016  Time spent: 35 minutes  Recommendations for Outpatient Follow-up:  1. Repeat CBC to follow Hgb trend 2. Repeat BMET to follow electrolytes and renal function 3. Reassess BP and adjust antihypertensive regimen as needed 4. 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 Will discharge home with home health services and close follow up with PCP.  Diet recommendation: heart healthy diet and modified carbohydrates   Filed Weights   09/20/16 0916 09/20/16 1700 09/21/16 1728  Weight: 51.7 kg (114 lb) 51.2 kg (112 lb 14 oz) 53.9 kg (118 lb 14.4 oz)    History of present illness:  PMH significant for DM1, HTN, CKD3, hypothyroidism, GERD, depression, anxiety, hx of CVA and recent admission for DKA in the setting of medical non compliance (9/22--9/27); who presented to ED with complaints of nausea, vomiting, inability to keep things down and her glucometer reading high.She denies fevers, chills, no specific sick contacts or exposures, no CP, no SOB and no dysuria. Pt also denies blood in vomitus and denies any diarrhea.  ED Course:in ED was found to have DKA, gap 28, acute on chronic renal failure, mild hyperkalemia and CBG >800. Patient  received IVF's bolus, was started on insulin drip and [facility] was called to admit patient for further evaluation and treatment.    Hospital Course:  1-DKA:  -anion gap 5, CBG < 200 in more than 10 occasions now and bicarb 21 at discharge -will continue Lantus 8 units daily and 5 units TID -will continue recommending modified carbohydrates diet -given hx of poor PO intake, will continue glucerna for supplementation; patient encourage not to skip meals and to be compliant with insulin use. -EKG w/o acute ischemic changes; mild troponin elevation initially probably from AKI. -no signs of infection in her urine and/or CXR -UDS positive for Benzos (which are prescribed to her) only; ETOH < 5 -per recent hx appears to be associated with medication non compliance  -A1C 9.2  2-hypothyroidism -continue synthroid   3-hyperkalemia: -resolved now -no EKG changes seen when potassium was elevated -electrolytes WNL at discharge -recommending repeat BMET at follow up to check electrolytes  4-hx of CVA: -no new deficit appreciated During this hospitalization -will continue plavix and ASA for secondary prevention  5-HLD: -will continue statins  6-HTN: -will continue Coreg and low dose losartan -due to soft BP, norvasc discontinued -patient advise to follow heart healthy diet   7-Depression/anxiety: -will continue lexapro and PRN ativan -no SI or hallucinations reported   8-hx of tobacco abuse and alcohol abuse -cessation counseling provided for tobacco and encourage to maintain alcohol abstinence -patient reports multiple months since her last alcohol drink -ETOH < 5 -nicotine patch ordered  9-AOCD -no overt bleeding appreciated or reproted -will recommend CBC to follow Hgb trend -continue niferex  10-GERD: -will continue PPI  11-acute on chronic renal failure: stage 3 at baseline -due to pre-renal azotemia and dehydration from hyperglycemia/DKA and GI loses (from  intractable nausea/vomting due to DKA prior to admission) -advise to keep herself well hydrated  -UA w/o signs of infection  -renal function back to baseline at discharge   -advise to maintain adequate hydration and will resume her losartan, but at lower dose at discharge.  12-physical deconditioning -Per PT/OT evaluation results, recommending HH services currently -per husband he feels she will benefit of short term SNF placement for rehab. -will set up HHPT, HHOT, HHRN, HH Aid and social worker.  11-dizziness: -Will continue the use of meclizine as needed -PT/OT ordered and recommending HH services at discharge   Procedures:  See below for x-ray reports   Consultations:  None   Discharge Exam: Vitals:   09/23/16 0100 09/23/16 0500  BP: 136/65 135/67  Pulse: 75 70  Resp: 16 16  Temp: 98.7 F (37.1 C) 98.8 F (37.1 C)    General: afebrile, no CP, no nausea, no vomiting. Patient is AAOX3 and able to follow commands properly. Appetite is not the best and patient even overall feeling better, endorses some weakness.   Cardiovascular: S1 and S2, no rubs or gallops, no JVD  Respiratory: CTA bilaterally   Abdomen: soft, NT, ND, positive BS, no guarding   Musculoskeletal: no edema, no cyanosis    Discharge Instructions  Current Discharge Medication List    START taking these medications   Details  aspirin 81 MG chewable tablet Chew 1 tablet (81 mg total) by mouth daily. Qty: 30 tablet, Refills: 1    pantoprazole (PROTONIX) 40 MG tablet Take 1 tablet (40 mg total) by mouth daily at 12 noon. Qty: 30 tablet, Refills: 1      CONTINUE these medications which have CHANGED   Details  HUMALOG 100 UNIT/ML injection Inject 0.05 mLs (5 Units total) into the skin 3 (three) times daily with meals. Qty: 10 mL, Refills: 11    insulin glargine (LANTUS) 100 UNIT/ML injection Inject 0.08 mLs (8 Units total) into the skin daily. Qty: 10 mL, Refills: 11    losartan (COZAAR)  25 MG tablet Take 1 tablet (25 mg total) by mouth daily. Qty: 30 tablet, Refills: 1      CONTINUE these medications which have NOT CHANGED   Details  atorvastatin (LIPITOR) 10 MG tablet Take 1 tablet (10 mg total) by mouth daily at 6 PM. Qty: 30 tablet, Refills: 0    carvedilol (COREG) 3.125 MG tablet Take 1 tablet (3.125 mg total) by mouth 2 (two) times daily with a meal. Qty: 60 tablet, Refills: 0    clopidogrel (PLAVIX) 75 MG tablet Take 75 mg by mouth daily.    escitalopram (LEXAPRO) 20 MG tablet Take 20 mg by mouth daily.    feeding supplement, GLUCERNA SHAKE, (GLUCERNA SHAKE) LIQD Take 237 mLs by mouth 3 (three) times daily between meals. Qty: 14 Can, Refills: 0    hydrocortisone (ANUSOL-HC) 2.5 % rectal cream Place rectally 4 (four) times daily. Qty: 30 g, Refills: 0    iron polysaccharides (NIFEREX) 150 MG capsule Take 1 capsule (150 mg total) by mouth 2 (two) times daily. Qty: 60 capsule, Refills: 1    levothyroxine (SYNTHROID, LEVOTHROID) 88 MCG tablet Take 88 mcg by mouth daily before breakfast.    LORazepam (ATIVAN) 0.5 MG tablet Take 1 tablet (0.5 mg total) by mouth every 8 (eight) hours as needed for anxiety. Qty: 20 tablet, Refills: 0  meclizine (ANTIVERT) 25 MG tablet Take 1 tablet (25 mg total) by mouth 3 (three) times daily as needed for dizziness. Qty: 30 tablet, Refills: 0    ondansetron (ZOFRAN) 4 MG tablet Take 1 tablet (4 mg total) by mouth every 6 (six) hours. Qty: 12 tablet, Refills: 0    oxyCODONE-acetaminophen (PERCOCET/ROXICET) 5-325 MG tablet Take 2 tablets by mouth every 4 (four) hours as needed for severe pain. Qty: 15 tablet, Refills: 0    polyethylene glycol (MIRALAX / GLYCOLAX) packet Take 17 g by mouth every other day. Qty: 14 each, Refills: 0    senna-docusate (SENOKOT-S) 8.6-50 MG tablet Take 1 tablet by mouth daily. Qty: 30 tablet, Refills: 0      STOP taking these medications     amLODipine (NORVASC) 5 MG tablet      aspirin  EC 325 MG tablet      diazepam (VALIUM) 5 MG tablet        Allergies  Allergen Reactions  . Ciprofloxacin Hives  . Hydrocodone Nausea And Vomiting  . Codeine Diarrhea and Nausea And Vomiting  . Doxycycline Diarrhea and Nausea And Vomiting  . Omnicef [Cefdinir] Nausea Only and Other (See Comments)    Constipation, tolerated Zosyn  . Augmentin [Amoxicillin-Pot Clavulanate] Hives and Rash    Has patient had a PCN reaction causing immediate rash, facial/tongue/throat swelling, SOB or lightheadedness with hypotension: Yes Has patient had a PCN reaction causing severe rash involving mucus membranes or skin necrosis: Yes Did a PCN reaction that required hospitalization No Did PCN reaction occurring within the last 10 years: Yes If all of the above answers are "NO", then may proceed with Cephalosporin use.  Pt states she has taken penicillin since, and was ok with it...   Follow-up Information    [facility] .   Specialty:  Home Health Services Why:  HH nursing, physical therapy,occupational therapy,aide,social worker Contact information: [address] [place] [address] [phone]        Irving Copas, MD Follow up in 1 week(s).   Specialty:  Family Medicine Contact information: [address] [place] [address] [phone]            The results of significant diagnostics from this hospitalization (including imaging, microbiology, ancillary and laboratory) are listed below for reference.    Significant Diagnostic Studies: Dg Chest 2 View  Result Date: 09/18/2016 CLINICAL DATA:  Left abdominal pain. Bilateral lower extremity swelling. EXAM: CHEST  2 VIEW COMPARISON:  CT chest 05/12/2016.  PA and lateral chest 07/11/2016. FINDINGS: The lungs appear clear. Small right pleural effusion is identified. No pneumothorax. Heart size is normal. IMPRESSION: Small right pleural effusion.  Otherwise negative Electronically Signed    By: Drusilla Kanner M.D.   On: 09/18/2016 15:23   Dg Chest 2 View  Result Date: 09/06/2016 CLINICAL DATA:  Fall. EXAM: CHEST  2 VIEW COMPARISON:  07/11/2016. FINDINGS: Mediastinum and hilar structures normal. Lungs are clear. No pleural effusion or pneumothorax. Heart size normal. Degenerative changes thoracic spine. Stable mild thoracic spine compressions noted. IMPRESSION: No acute cardiopulmonary disease. Electronically Signed   By: Maisie Fus  Register   On: 09/06/2016 13:05   Ct Head Wo Contrast  Result Date: 09/06/2016 CLINICAL DATA:  N/V, 4 episodes of vomit this am, pt has right periorbital bruising, states that she had a dizzy spell and tell last night, no LOC, reports that she did have some vomiting yesterday in addition to the 4 episodes  this am, pt is a type 1 diabetic, pt took bld sugar this am and meter registered "high"- pt took 10 units of Humalog at 9 am, per EMS it was 577 en route EXAM: CT HEAD WITHOUT CONTRAST CT MAXILLOFACIAL WITHOUT CONTRAST CT CERVICAL SPINE WITHOUT CONTRAST TECHNIQUE: Multidetector CT imaging of the head, cervical spine, and maxillofacial structures were performed using the standard protocol without intravenous contrast. Multiplanar CT image reconstructions of the cervical spine and maxillofacial structures were also generated. COMPARISON:  07/11/2016 FINDINGS: CT HEAD FINDINGS Brain: Diffuse parenchymal atrophy. Patchy areas of hypoattenuation in deep and periventricular white matter bilaterally. Negative for acute intracranial hemorrhage, mass lesion, acute infarction, midline shift, or mass-effect. Acute infarct may be inapparent on noncontrast CT. Ventricles and sulci symmetric. Vascular: No hyperdense vessel or unexpected calcification. Skull: Normal. Negative for fracture or focal lesion. Sinuses/Orbits: Frontal sinuses hypoplastic.  No acute finding. Other: CT MAXILLOFACIAL FINDINGS Osseous: No fracture or mandibular dislocation. No destructive process. Orbits:  Negative. No traumatic or inflammatory finding. Sinuses: Hypoplastic frontal sinuses. Remainder are normally developed and well aerated. Soft tissues: Negative Limited intracranial: No significant or unexpected finding. CT CERVICAL SPINE FINDINGS Alignment: Loss of the normal lordosis. Skull base and vertebrae: Asymmetric facet DJD and spurring left C2-3, right C3-4 and C4-5 with early foraminal encroachment at these levels. Previous instrumented ACDF C5-6, hardware intact without surrounding lucency, solid-appearing fusion across the interspace. Exuberant anterior bridging osteophytes C7-T1. Soft tissues and spinal canal: Posterior partially calcified protrusions C2-3 and C6-7. Disc levels: Solid fusion across the C5-6 level. There is mild narrowing C6-7. Disc heights well preserved elsewhere. Upper chest: Negative. Other: Bilateral calcified carotid bifurcation plaque appear IMPRESSION: 1. Negative for bleed or other acute intracranial process. 2. Mild brain atrophy with nonspecific white matter changes. 3. No maxillofacial fracture or other acute finding. 4. No cervical fracture or other acute finding. 5. Degenerative and postop changes in the cervical spine as above Electronically Signed   By: Corlis Leak M.D.   On: 09/06/2016 13:16   Ct Cervical Spine Wo Contrast  Result Date: 09/06/2016 CLINICAL DATA:  N/V, 4 episodes of vomit this am, pt has right periorbital bruising, states that she had a dizzy spell and tell last night, no LOC, reports that she did have some vomiting yesterday in addition to the 4 episodes this am, pt is a type 1 diabetic, pt took bld sugar this am and meter registered "high"- pt took 10 units of Humalog at 9 am, per EMS it was 577 en route EXAM: CT HEAD WITHOUT CONTRAST CT MAXILLOFACIAL WITHOUT CONTRAST CT CERVICAL SPINE WITHOUT CONTRAST TECHNIQUE: Multidetector CT imaging of the head, cervical spine, and maxillofacial structures were performed using the standard protocol without  intravenous contrast. Multiplanar CT image reconstructions of the cervical spine and maxillofacial structures were also generated. COMPARISON:  07/11/2016 FINDINGS: CT HEAD FINDINGS Brain: Diffuse parenchymal atrophy. Patchy areas of hypoattenuation in deep and periventricular white matter bilaterally. Negative for acute intracranial hemorrhage, mass lesion, acute infarction, midline shift, or mass-effect. Acute infarct may be inapparent on noncontrast CT. Ventricles and sulci symmetric. Vascular: No hyperdense vessel or unexpected calcification. Skull: Normal. Negative for fracture or focal lesion. Sinuses/Orbits: Frontal sinuses hypoplastic.  No acute finding. Other: CT MAXILLOFACIAL FINDINGS Osseous: No fracture or mandibular dislocation. No destructive process. Orbits: Negative. No traumatic or inflammatory finding. Sinuses: Hypoplastic frontal sinuses. Remainder are normally developed and well aerated. Soft tissues: Negative Limited intracranial: No significant or unexpected finding. CT CERVICAL SPINE FINDINGS Alignment: Loss  of the normal lordosis. Skull base and vertebrae: Asymmetric facet DJD and spurring left C2-3, right C3-4 and C4-5 with early foraminal encroachment at these levels. Previous instrumented ACDF C5-6, hardware intact without surrounding lucency, solid-appearing fusion across the interspace. Exuberant anterior bridging osteophytes C7-T1. Soft tissues and spinal canal: Posterior partially calcified protrusions C2-3 and C6-7. Disc levels: Solid fusion across the C5-6 level. There is mild narrowing C6-7. Disc heights well preserved elsewhere. Upper chest: Negative. Other: Bilateral calcified carotid bifurcation plaque appear IMPRESSION: 1. Negative for bleed or other acute intracranial process. 2. Mild brain atrophy with nonspecific white matter changes. 3. No maxillofacial fracture or other acute finding. 4. No cervical fracture or other acute finding. 5. Degenerative and postop changes in the  cervical spine as above Electronically Signed   By: Corlis Leak  Hassell M.D.   On: 09/06/2016 13:16   Ct Abdomen Pelvis W Contrast  Result Date: 09/18/2016 CLINICAL DATA:  LEFT-sided abdominal pain EXAM: CT ABDOMEN AND PELVIS WITH CONTRAST TECHNIQUE: Multidetector CT imaging of the abdomen and pelvis was performed using the standard protocol following bolus administration of intravenous contrast. CONTRAST:  75mL ISOVUE-300 IOPAMIDOL (ISOVUE-300) INJECTION 61%, 30mL ISOVUE-300 IOPAMIDOL (ISOVUE-300) INJECTION 61% COMPARISON:  07/21/2016 FINDINGS: Lower chest: Lung bases are clear. Hepatobiliary: No focal hepatic lesion. Mild intra and extrahepatic biliary duct dilatation following cholecystectomy. The common bile duct measures 10 mm in diameter. Pancreas: No pancreatic duct dilatation. The pancreatic parenchyma is normal. Spleen: Normal Adrenals/urinary tract: Adrenal glands and kidneys are normal. The ureters and bladder normal. Stomach/Bowel: Stomach, small-bowel and cecum are normal. Appendix not identified. Colon and rectosigmoid colon are normal. Vascular/Lymphatic: Abdominal aorta is normal caliber with atherosclerotic calcification. There is no retroperitoneal or periportal lymphadenopathy. No pelvic lymphadenopathy. Reproductive: Post hysterectomy anatomy. Other: No ventral hernia or inguinal hernia. Anasarca of the soft tissues of the flanks. Musculoskeletal: No aggressive osseous lesion. Mottled appearance of the bones may relate to chronic renal insufficiency IMPRESSION: 1. No clear explanation for LEFT-sided pain. No diverticulitis or obstructive uropathy. 2. Small bilateral pleural effusions and mild anasarca. 3.  Atherosclerotic calcification of the aorta. 4. Intra and extrahepatic duct dilatation likely relates to prior cholecystectomy. Recommend correlation with bilirubin levels. Electronically Signed   By: Genevive BiStewart  Edmunds M.D.   On: 09/18/2016 17:22   Portable Chest X-ray (1 View)  Result Date:  09/20/2016 CLINICAL DATA:  Diabetic ketoacidosis.  Shortness of breath. EXAM: PORTABLE CHEST 1 VIEW COMPARISON:  09/18/2016 FINDINGS: Shallow inspiration. Heart size and pulmonary vascularity are normal. No focal airspace disease or consolidation in the lungs. No blunting of costophrenic angles. No pneumothorax. Mediastinal contours appear intact. Postoperative changes in the cervical spine. Degenerative changes in the thoracic spine and shoulders. IMPRESSION: No active disease. Electronically Signed   By: Burman NievesWilliam  Stevens M.D.   On: 09/20/2016 19:43   Ct Maxillofacial Wo Contrast  Result Date: 09/06/2016 CLINICAL DATA:  N/V, 4 episodes of vomit this am, pt has right periorbital bruising, states that she had a dizzy spell and tell last night, no LOC, reports that she did have some vomiting yesterday in addition to the 4 episodes this am, pt is a type 1 diabetic, pt took bld sugar this am and meter registered "high"- pt took 10 units of Humalog at 9 am, per EMS it was 577 en route EXAM: CT HEAD WITHOUT CONTRAST CT MAXILLOFACIAL WITHOUT CONTRAST CT CERVICAL SPINE WITHOUT CONTRAST TECHNIQUE: Multidetector CT imaging of the head, cervical spine, and maxillofacial structures were performed using the standard protocol  without intravenous contrast. Multiplanar CT image reconstructions of the cervical spine and maxillofacial structures were also generated. COMPARISON:  07/11/2016 FINDINGS: CT HEAD FINDINGS Brain: Diffuse parenchymal atrophy. Patchy areas of hypoattenuation in deep and periventricular white matter bilaterally. Negative for acute intracranial hemorrhage, mass lesion, acute infarction, midline shift, or mass-effect. Acute infarct may be inapparent on noncontrast CT. Ventricles and sulci symmetric. Vascular: No hyperdense vessel or unexpected calcification. Skull: Normal. Negative for fracture or focal lesion. Sinuses/Orbits: Frontal sinuses hypoplastic.  No acute finding. Other: CT MAXILLOFACIAL FINDINGS  Osseous: No fracture or mandibular dislocation. No destructive process. Orbits: Negative. No traumatic or inflammatory finding. Sinuses: Hypoplastic frontal sinuses. Remainder are normally developed and well aerated. Soft tissues: Negative Limited intracranial: No significant or unexpected finding. CT CERVICAL SPINE FINDINGS Alignment: Loss of the normal lordosis. Skull base and vertebrae: Asymmetric facet DJD and spurring left C2-3, right C3-4 and C4-5 with early foraminal encroachment at these levels. Previous instrumented ACDF C5-6, hardware intact without surrounding lucency, solid-appearing fusion across the interspace. Exuberant anterior bridging osteophytes C7-T1. Soft tissues and spinal canal: Posterior partially calcified protrusions C2-3 and C6-7. Disc levels: Solid fusion across the C5-6 level. There is mild narrowing C6-7. Disc heights well preserved elsewhere. Upper chest: Negative. Other: Bilateral calcified carotid bifurcation plaque appear IMPRESSION: 1. Negative for bleed or other acute intracranial process. 2. Mild brain atrophy with nonspecific white matter changes. 3. No maxillofacial fracture or other acute finding. 4. No cervical fracture or other acute finding. 5. Degenerative and postop changes in the cervical spine as above Electronically Signed   By: Corlis Leak M.D.   On: 09/06/2016 13:16    Microbiology: Recent Results (from the past 240 hour(s))  MRSA PCR Screening     Status: None   Collection Time: 09/20/16  4:40 PM  Result Value Ref Range Status   MRSA by PCR NEGATIVE NEGATIVE Final    Comment:        The GeneXpert MRSA Assay (FDA approved for NASAL specimens only), is one component of a comprehensive MRSA colonization surveillance program. It is not intended to diagnose MRSA infection nor to guide or monitor treatment for MRSA infections.      Labs: Basic Metabolic Panel:  Recent Labs Lab 09/20/16 1830 09/20/16 2220 09/21/16 0136 09/21/16 0657  09/22/16 0523 09/23/16 0445  NA 136 139 140 140 140 141  K 3.7 3.3* 3.5 4.4 4.0 4.1  CL 107 114* 115* 111 112* 111  CO2 20* 14* 20* 20* 21* 25  GLUCOSE 147* 200* 75 98 73 74  BUN 58* 30* 29* 28* 24* 21*  CREATININE 3.23* 2.15* 1.95* 1.64* 1.46* 1.23*  CALCIUM 8.6* 7.6* 7.5* 7.5* 7.7* 7.8*  MG 1.6*  --   --   --   --   --   PHOS 4.0  --   --   --   --   --    Liver Function Tests:  Recent Labs Lab 09/18/16 1224 09/20/16 1233  AST 25 24  ALT 28 32  ALKPHOS 80 95  BILITOT 0.2* 1.8*  PROT 6.1* 6.0*  ALBUMIN 3.0* 2.8*    Recent Labs Lab 09/18/16 1224 09/20/16 1233  LIPASE 123* 32   No results for input(s): AMMONIA in the last 168 hours. CBC:  Recent Labs Lab 09/18/16 1224 09/20/16 1233 09/23/16 0445  WBC 8.9 13.2* 6.7  NEUTROABS  --  12.2*  --   HGB 11.0* 9.8* 7.8*  HCT 33.0* 33.2* 23.3*  MCV 91.7 102.5* 92.1  PLT 365 357 206   Cardiac Enzymes:  Recent Labs Lab 09/20/16 1830 09/21/16 0136 09/21/16 0657  TROPONINI 0.07* <0.03 <0.03   BNP: BNP (last 3 results)  Recent Labs  07/11/16 1600 09/06/16 1215 09/18/16 1224  BNP 469.1* 187.2* 196.0*    CBG:  Recent Labs Lab 09/22/16 1612 09/22/16 2133 09/23/16 0801 09/23/16 0837 09/23/16 1245  GLUCAP 167* 79 64* 93 166*       Signed:  Vassie Loll MD.  [facility] 09/23/2016, 2:30 PM

## 2016-09-23 NOTE — Care Management Note (Signed)
Case Management Note  Patient Details  Name: Cassandra [name] MRN: [id number] Date of Birth: 04-25-1946  Subjective/Objective: 70 y/o f admitted w/DKA. Hx: etoh.Readmit. 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 Active w/[facility]-rep Edwinna-difficult time getting into patient's home for home visits since both are etoh(don't answer door), [facility]HC have already provided several visits w/social worker,nurses-this has not made an impact-patient making bad choices.Therefore patient not appropriate for [facility] rep aware & following-await HHRN,PT/OT/aide SW,[id number] order.  Action/Plan:d/c home w/HHC.   Expected Discharge Date:   (unknown)               Expected Discharge Plan:  Home w Home Health Services  In-House Referral:     Discharge planning Services  CM Consult  Post Acute Care Choice:  Home Health (Active w/[facility]-HHRN) Choice offered to:     DME Arranged:    DME Agency:     HH Arranged:  RN, PT, OT, Nurse's Aide, Social Work [facility]  Status of Service:  Completed, signed off  If discussed at [employer] Length of Stay Meetings, dates discussed:    Additional Comments:  Lanier ClamMahabir, Nezar Buckles, RN 09/23/2016, 1:00 PM

## 2016-09-26 ENCOUNTER — Emergency Department
Admission: EM | Admit: 2016-09-26 | Discharge: 2016-09-26 | Disposition: A | Discharge status: Home / Self Care | Payer: Medicare Other | Attending: Emergency Medicine | Admitting: Emergency Medicine

## 2016-09-26 ENCOUNTER — Emergency Department: Payer: Medicare Other

## 2016-09-26 ENCOUNTER — Encounter: Payer: Self-pay | Admitting: *Deleted

## 2016-09-26 DIAGNOSIS — R079 Chest pain, unspecified: Secondary | ICD-10-CM

## 2016-09-26 DIAGNOSIS — E1022 Type 1 diabetes mellitus with diabetic chronic kidney disease: Secondary | ICD-10-CM | POA: Diagnosis not present

## 2016-09-26 DIAGNOSIS — I130 Hypertensive heart and chronic kidney disease with heart failure and stage 1 through stage 4 chronic kidney disease, or unspecified chronic kidney disease: Secondary | ICD-10-CM | POA: Diagnosis not present

## 2016-09-26 DIAGNOSIS — Z7982 Long term (current) use of aspirin: Secondary | ICD-10-CM | POA: Diagnosis not present

## 2016-09-26 DIAGNOSIS — E039 Hypothyroidism, unspecified: Secondary | ICD-10-CM | POA: Diagnosis not present

## 2016-09-26 DIAGNOSIS — F17210 Nicotine dependence, cigarettes, uncomplicated: Secondary | ICD-10-CM | POA: Diagnosis not present

## 2016-09-26 DIAGNOSIS — I5032 Chronic diastolic (congestive) heart failure: Secondary | ICD-10-CM | POA: Insufficient documentation

## 2016-09-26 DIAGNOSIS — Z794 Long term (current) use of insulin: Secondary | ICD-10-CM | POA: Diagnosis not present

## 2016-09-26 DIAGNOSIS — N183 Chronic kidney disease, stage 3 (moderate): Secondary | ICD-10-CM | POA: Diagnosis not present

## 2016-09-26 DIAGNOSIS — R0789 Other chest pain: Secondary | ICD-10-CM | POA: Diagnosis present

## 2016-09-26 LAB — BASIC METABOLIC PANEL
ANION GAP: 8 (ref 5–15)
BUN: 18 mg/dL (ref 6–20)
CHLORIDE: 105 mmol/L (ref 101–111)
CO2: 24 mmol/L (ref 22–32)
CREATININE: 1.24 mg/dL — AB (ref 0.44–1.00)
Calcium: 8.1 mg/dL — ABNORMAL LOW (ref 8.9–10.3)
GFR calc non Af Amer: 43 mL/min — ABNORMAL LOW (ref >60)
GFR, EST AFRICAN AMERICAN: 50 mL/min — AB (ref >60)
GLUCOSE: 157 mg/dL — AB (ref 65–99)
Potassium: 3.9 mmol/L (ref 3.5–5.1)
Sodium: 137 mmol/L (ref 135–145)

## 2016-09-26 LAB — CBC
HCT: 27 % — ABNORMAL LOW (ref 35.0–47.0)
HEMOGLOBIN: 9.5 g/dL — AB (ref 12.0–16.0)
MCH: 31.7 pg (ref 26.0–34.0)
MCHC: 35 g/dL (ref 32.0–36.0)
MCV: 90.5 fL (ref 80.0–100.0)
Platelets: 219 10*3/uL (ref 150–440)
RBC: 2.99 MIL/uL — AB (ref 3.80–5.20)
RDW: 14.1 % (ref 11.5–14.5)
WBC: 8.4 10*3/uL (ref 3.6–11.0)

## 2016-09-26 LAB — TROPONIN I: Troponin I: <0.03 ng/mL (ref <0.03)

## 2016-09-26 LAB — HEPATIC FUNCTION PANEL
ALT: 17 U/L (ref 14–54)
AST: 24 U/L (ref 15–41)
Albumin: 2.2 g/dL — ABNORMAL LOW (ref 3.5–5.0)
Alkaline Phosphatase: 68 U/L (ref 38–126)
BILIRUBIN TOTAL: 0.6 mg/dL (ref 0.3–1.2)
Total Protein: 4.9 g/dL — ABNORMAL LOW (ref 6.5–8.1)

## 2016-09-26 LAB — BRAIN NATRIURETIC PEPTIDE: B Natriuretic Peptide: 179 pg/mL — ABNORMAL HIGH (ref 0.0–100.0)

## 2016-09-26 NOTE — Discharge Instructions (Signed)
Please return immediately if condition worsens. Please contact her primary physician or the physician you were given for referral. If you have any specialist physicians involved in her treatment and plan please also contact them. Thank you for using [facility]. ° °

## 2016-09-26 NOTE — ED Triage Notes (Signed)
Pt brought in via ems from [facility].  Pt has chest pain.  Pt has pain for 2 hours.  Pain radiated into right arm.  No sob.  No n/v/d.  Pt alert.

## 2016-09-26 NOTE — ED Notes (Signed)
md in with pt.  Iv in place.  Pt removed all equipment from self and wants to go home.

## 2016-09-26 NOTE — ED Notes (Signed)
Pt has chest pain and a headache.  Pt states pain in center of chest feels like tightness.  No sob.  Pain for 2-3 hours.  No n/v/d.  Pt states pain radiated into right arm but has subsided now.  Pt has a headache every day.  Pt alert.  Speech clear.

## 2016-09-26 NOTE — ED Provider Notes (Signed)
Time Seen: Approximately 1714 I have reviewed the triage notes  Chief Complaint: Chest Pain   History of Present Illness: Cassandra [name] is a 70 y.o. female who presents via EMS from [facility]. Patient has a history of multiple medical problems and was recently assessed for congestive heart failure, etc. The patient was to be admitted but there was some difficulty with her paperwork. Patient laid down on the couch and stated that she had some chest discomfort which she denies any chest pain upon arrival. He denies any nausea, vomiting, arm or jaw pain. She admits that she became somewhat anxious when she wasn't able to be admitted at [facility].   Past Medical History:  Diagnosis Date  . Allergic rhinitis   . Anxiety   . Benign hypertension with CKD (chronic kidney disease) stage III   . Benign paroxysmal positional vertigo   . Bipolar disorder (HCC)   . Broken finger   . Broken shoulder   . Broken toes   . Cervicalgia   . CHF (congestive heart failure) (HCC) 07/2016  . CKD (chronic kidney disease) stage 3, GFR 30-59 ml/min   . Depression   . Diabetes mellitus without complication (HCC)   . Elevated liver enzymes Hep B/C neg 2014  . GERD (gastroesophageal reflux disease)   . Glaucoma   . History of alcohol use   . Hypertension   . Hypothyroidism   . Interstitial cystitis    bladder stretched every 9 months  . Migraines   . Psoriasis   . Stroke (HCC) 05/16/2016   Left occipital and thalamic, right hippocampal  . Thyroid disease   . Tobacco use   . 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 Vitamin B12 deficiency     Patient Active Problem List   Diagnosis Date Noted  . Hyperkalemia   . Non-intractable vomiting   . Gastroesophageal reflux disease   . H/O: CVA (cerebrovascular accident)   . DKA, type 1 (HCC) 09/06/2016  . Hypoglycemia associated with diabetes (HCC)   . Orthostatic hypotension   . Protein-calorie malnutrition, severe  08/28/2016  . UTI (lower urinary tract infection) 07/19/2016  . AKI (acute kidney injury) (HCC) 07/19/2016  . Chronic diastolic heart failure (HCC) 07/19/2016  . Dehydration   . Cerebral embolism with cerebral infarction 07/13/2016  . Acute diastolic CHF (congestive heart failure) (HCC) 07/11/2016  . Cerebrovascular accident (CVA) (HCC)   . Essential hypertension   . HLD (hyperlipidemia)   . Anemia of chronic disease   . Physical deconditioning   . Acute renal failure superimposed on stage 3 chronic kidney disease (HCC)   . Diabetic ketoacidosis with coma associated with type 1 diabetes mellitus (HCC)   . History of ETT   . Stroke (HCC) 05/16/2016  . Cerebral thrombosis with cerebral infarction 05/15/2016  . Acute on chronic renal failure (HCC)   . Generalized anxiety disorder 05/10/2016  . Diabetic ketoacidosis without coma associated with type 1 diabetes mellitus (HCC) 05/06/2016  . Acute respiratory failure (HCC) 05/06/2016  . Acute respiratory failure with hypoxia (HCC) 05/06/2016  . Acute encephalopathy 05/06/2016  . Septic shock (HCC)   . Acute respiratory failure with hypoxia and hypercapnia (HCC)   . 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 Depression   . Hypertension   . CKD (chronic kidney disease) stage 3, GFR 30-59 ml/min   . Hypothyroidism   . Vitamin B12 deficiency   . Benign paroxysmal positional  vertigo   . Anxiety   . Allergic rhinitis   . Glaucoma   . Benign hypertension with CKD (chronic kidney disease) stage III   . Tobacco abuse   . Cervicalgia   . Elevated liver enzymes   . History of alcohol use     Past Surgical History:  Procedure Laterality Date  . ABDOMINAL HYSTERECTOMY     with oophorectomy  . APPENDECTOMY    . BLADDER SURGERY    . CATARACT EXTRACTION    . CERVICAL DISC SURGERY    . CHOLECYSTECTOMY    . HERNIA REPAIR    . TONSILLECTOMY      Past Surgical History:  Procedure Laterality Date  . ABDOMINAL  HYSTERECTOMY     with oophorectomy  . APPENDECTOMY    . BLADDER SURGERY    . CATARACT EXTRACTION    . CERVICAL DISC SURGERY    . CHOLECYSTECTOMY    . HERNIA REPAIR    . TONSILLECTOMY      Current Outpatient Rx  . Order #: [id number] Class: Normal  . Order #: [id number] Class: Normal  . Order #: [id number] Class: Normal  . Order #: [id number] Class: Historical Med  . Order #: [id number] Class: Historical Med  . Order #: [id number] Class: Normal  . Order #: [id number] Class: Normal  . Order #: [id number] Class: Normal  . Order #: [id number] Class: Normal  . Order #: [id number] Class: Normal  . Order #: [id number] Class: Historical Med  . Order #: [id number] Class: Print  . Order #: [id number] Class: Normal  . Order #: [id number] Class: Normal  . Order #: [id number] Class: Print  . Order #: [id number] Class: Print  . Order #: [id number] Class: Normal  . Order #: [id number] Class: Normal  . Order #: [id number] Class: Print    Allergies:  Ciprofloxacin; Hydrocodone; Codeine; Doxycycline; Omnicef [cefdinir]; and Augmentin [amoxicillin-pot clavulanate]  Family History: Family History  Problem Relation Age of Onset  . Alcohol abuse Mother   . Arthritis Mother   . Asthma Mother   . Cancer Mother     colon cancer  . Hypertension Mother   . Migraines Mother   . Stroke Mother   . Lung disease Mother   . COPD Mother   . Diabetes Father   . Hypertension Father   . Heart disease Father   . Heart attack Father   . Heart disease Paternal Grandmother   . Diabetes Paternal Grandmother   . Stroke Paternal Grandmother   . Cancer Paternal Grandmother   . Diabetes Paternal Grandfather     Social History: Social History  Substance Use Topics  . Smoking status: Current Every Day Smoker    Packs/day: 0.25    Types: Cigarettes  . Smokeless tobacco: Never Used  . Alcohol use No     Comment: Hasn't had any alcohol since last admission     Review of Systems:   10 point review of systems was performed and was  otherwise negative:  Constitutional: No fever Eyes: No visual disturbances ENT: No sore throat, ear pain Cardiac: NoCurrent chest pain Respiratory: No shortness of breath, wheezing, or stridor Abdomen: No abdominal pain, no vomiting, No diarrhea Endocrine: No weight loss, No night sweats Extremities: No peripheral edema, cyanosis Skin: No rashes, easy bruising Neurologic: No focal weakness, trouble with speech or swollowing Urologic: No dysuria, Hematuria, or urinary frequency   Physical Exam:  ED Triage Vitals  Enc Vitals Group     BP 09/26/16 1642 (!) 164/76     Pulse Rate 09/26/16 1642 80     Resp 09/26/16  1642 20     Temp 09/26/16 1642 99.1 F (37.3 C)     Temp Source 09/26/16 1642 Oral     SpO2 09/26/16 1642 99 %     Weight 09/26/16 1645 110 lb (49.9 kg)     Height 09/26/16 1645 5\' 2"  (1.575 m)     Head Circumference --      [facility] --      Pain Score 09/26/16 1646 8     Pain Loc --      Pain Edu? --      Excl. in GC? --     General: Awake , Alert , and Oriented times 3; GCS 15 Head: Normal cephalic , atraumatic Eyes: Pupils equal , round, reactive to light Nose/Throat: No nasal drainage, patent upper airway without erythema or exudate.  Neck: Supple, Full range of motion, No anterior adenopathy or palpable thyroid masses Lungs: Clear to ascultation without wheezes , rhonchi, or rales Heart: Regular rate, regular rhythm without murmurs , gallops , or rubs Abdomen: Soft, non tender without rebound, guarding , or rigidity; bowel sounds positive and symmetric in all 4 quadrants. No organomegaly .        Extremities: Patient has some mild edema in both of her hands and her feet otherwise nonpitting. Neurologic: normal ambulation, Motor symmetric without deficits, sensory intact Skin: warm, dry, no rashes   Labs:   All laboratory work was reviewed including any pertinent negatives or positives listed below:  Labs Reviewed  BASIC METABOLIC PANEL - Abnormal;  Notable for the following:       Result Value   Glucose, Bld 157 (*)    Creatinine, Ser 1.24 (*)    Calcium 8.1 (*)    GFR calc non Af Amer 43 (*)    GFR calc Af Amer 50 (*)    All other components within normal limits  CBC - Abnormal; Notable for the following:    RBC 2.99 (*)    Hemoglobin 9.5 (*)    HCT 27.0 (*)    All other components within normal limits  HEPATIC FUNCTION PANEL - Abnormal; Notable for the following:    Total Protein 4.9 (*)    Albumin 2.2 (*)    Bilirubin, Direct <0.1 (*)    All other components within normal limits  BRAIN NATRIURETIC PEPTIDE - Abnormal; Notable for the following:    B Natriuretic Peptide 179.0 (*)    All other components within normal limits  TROPONIN I  No acute significant abnormalities were noted in comparison to previous laboratory work etc.  EKG:  ED ECG REPORT I, Jennye Moccasin, the attending physician, personally viewed and interpreted this ECG.  Date: 09/26/2016 EKG Time: 1648Rate: 78 Rhythm: normal sinus rhythm QRS Axis: normal Intervals: normal ST/T Wave abnormalities: normal Conduction Disturbances: none Narrative Interpretation: unremarkable No obvious acute ischemic changes   Radiology:  "Dg Chest 2 View  Result Date: 09/26/2016 CLINICAL DATA:  Chest pain with headache. Radiates into the right arm. EXAM: CHEST  2 VIEW COMPARISON:  09/20/2016 FINDINGS: There is no focal parenchymal opacity. There is a small right pleural effusion. The heart and mediastinal contours are unremarkable. The osseous structures are unremarkable. IMPRESSION: Small right pleural effusion. Electronically Signed   By: Elige Ko   On: 09/26/2016 17:33   Dg Chest 2 View  Result Date: 09/18/2016 CLINICAL DATA:  Left abdominal pain. Bilateral lower extremity swelling. EXAM: CHEST  2 VIEW COMPARISON:  CT chest 05/12/2016.  PA and  lateral chest 07/11/2016. FINDINGS: The lungs appear clear. Small right pleural effusion is identified. No  pneumothorax. Heart size is normal. IMPRESSION: Small right pleural effusion.  Otherwise negative Electronically Signed   By: Drusilla Kanner M.D.   On: 09/18/2016 15:23   Dg Chest 2 View  Result Date: 09/06/2016 CLINICAL DATA:  Fall. EXAM: CHEST  2 VIEW COMPARISON:  07/11/2016. FINDINGS: Mediastinum and hilar structures normal. Lungs are clear. No pleural effusion or pneumothorax. Heart size normal. Degenerative changes thoracic spine. Stable mild thoracic spine compressions noted. IMPRESSION: No acute cardiopulmonary disease. Electronically Signed   By: Maisie Fus  Register   On: 09/06/2016 13:05   Ct Head Wo Contrast  Result Date: 09/06/2016 CLINICAL DATA:  N/V, 4 episodes of vomit this am, pt has right periorbital bruising, states that she had a dizzy spell and tell last night, no LOC, reports that she did have some vomiting yesterday in addition to the 4 episodes this am, pt is a type 1 diabetic, pt took bld sugar this am and meter registered "high"- pt took 10 units of Humalog at 9 am, per EMS it was 577 en route EXAM: CT HEAD WITHOUT CONTRAST CT MAXILLOFACIAL WITHOUT CONTRAST CT CERVICAL SPINE WITHOUT CONTRAST TECHNIQUE: Multidetector CT imaging of the head, cervical spine, and maxillofacial structures were performed using the standard protocol without intravenous contrast. Multiplanar CT image reconstructions of the cervical spine and maxillofacial structures were also generated. COMPARISON:  07/11/2016 FINDINGS: CT HEAD FINDINGS Brain: Diffuse parenchymal atrophy. Patchy areas of hypoattenuation in deep and periventricular white matter bilaterally. Negative for acute intracranial hemorrhage, mass lesion, acute infarction, midline shift, or mass-effect. Acute infarct may be inapparent on noncontrast CT. Ventricles and sulci symmetric. Vascular: No hyperdense vessel or unexpected calcification. Skull: Normal. Negative for fracture or focal lesion. Sinuses/Orbits: Frontal sinuses hypoplastic.  No acute  finding. Other: CT MAXILLOFACIAL FINDINGS Osseous: No fracture or mandibular dislocation. No destructive process. Orbits: Negative. No traumatic or inflammatory finding. Sinuses: Hypoplastic frontal sinuses. Remainder are normally developed and well aerated. Soft tissues: Negative Limited intracranial: No significant or unexpected finding. CT CERVICAL SPINE FINDINGS Alignment: Loss of the normal lordosis. Skull base and vertebrae: Asymmetric facet DJD and spurring left C2-3, right C3-4 and C4-5 with early foraminal encroachment at these levels. Previous instrumented ACDF C5-6, hardware intact without surrounding lucency, solid-appearing fusion across the interspace. Exuberant anterior bridging osteophytes C7-T1. Soft tissues and spinal canal: Posterior partially calcified protrusions C2-3 and C6-7. Disc levels: Solid fusion across the C5-6 level. There is mild narrowing C6-7. Disc heights well preserved elsewhere. Upper chest: Negative. Other: Bilateral calcified carotid bifurcation plaque appear IMPRESSION: 1. Negative for bleed or other acute intracranial process. 2. Mild brain atrophy with nonspecific white matter changes. 3. No maxillofacial fracture or other acute finding. 4. No cervical fracture or other acute finding. 5. Degenerative and postop changes in the cervical spine as above Electronically Signed   By: Corlis Leak M.D.   On: 09/06/2016 13:16   Ct Cervical Spine Wo Contrast  Result Date: 09/06/2016 CLINICAL DATA:  N/V, 4 episodes of vomit this am, pt has right periorbital bruising, states that she had a dizzy spell and tell last night, no LOC, reports that she did have some vomiting yesterday in addition to the 4 episodes this am, pt is a type 1 diabetic, pt took bld sugar this am and meter registered "high"- pt took 10 units of Humalog at 9 am, per EMS it was 577 en route EXAM: CT HEAD WITHOUT CONTRAST  CT MAXILLOFACIAL WITHOUT CONTRAST CT CERVICAL SPINE WITHOUT CONTRAST TECHNIQUE: Multidetector CT  imaging of the head, cervical spine, and maxillofacial structures were performed using the standard protocol without intravenous contrast. Multiplanar CT image reconstructions of the cervical spine and maxillofacial structures were also generated. COMPARISON:  07/11/2016 FINDINGS: CT HEAD FINDINGS Brain: Diffuse parenchymal atrophy. Patchy areas of hypoattenuation in deep and periventricular white matter bilaterally. Negative for acute intracranial hemorrhage, mass lesion, acute infarction, midline shift, or mass-effect. Acute infarct may be inapparent on noncontrast CT. Ventricles and sulci symmetric. Vascular: No hyperdense vessel or unexpected calcification. Skull: Normal. Negative for fracture or focal lesion. Sinuses/Orbits: Frontal sinuses hypoplastic.  No acute finding. Other: CT MAXILLOFACIAL FINDINGS Osseous: No fracture or mandibular dislocation. No destructive process. Orbits: Negative. No traumatic or inflammatory finding. Sinuses: Hypoplastic frontal sinuses. Remainder are normally developed and well aerated. Soft tissues: Negative Limited intracranial: No significant or unexpected finding. CT CERVICAL SPINE FINDINGS Alignment: Loss of the normal lordosis. Skull base and vertebrae: Asymmetric facet DJD and spurring left C2-3, right C3-4 and C4-5 with early foraminal encroachment at these levels. Previous instrumented ACDF C5-6, hardware intact without surrounding lucency, solid-appearing fusion across the interspace. Exuberant anterior bridging osteophytes C7-T1. Soft tissues and spinal canal: Posterior partially calcified protrusions C2-3 and C6-7. Disc levels: Solid fusion across the C5-6 level. There is mild narrowing C6-7. Disc heights well preserved elsewhere. Upper chest: Negative. Other: Bilateral calcified carotid bifurcation plaque appear IMPRESSION: 1. Negative for bleed or other acute intracranial process. 2. Mild brain atrophy with nonspecific white matter changes. 3. No maxillofacial  fracture or other acute finding. 4. No cervical fracture or other acute finding. 5. Degenerative and postop changes in the cervical spine as above Electronically Signed   By: Corlis Leak M.D.   On: 09/06/2016 13:16   Ct Abdomen Pelvis W Contrast  Result Date: 09/18/2016 CLINICAL DATA:  LEFT-sided abdominal pain EXAM: CT ABDOMEN AND PELVIS WITH CONTRAST TECHNIQUE: Multidetector CT imaging of the abdomen and pelvis was performed using the standard protocol following bolus administration of intravenous contrast. CONTRAST:  75mL ISOVUE-300 IOPAMIDOL (ISOVUE-300) INJECTION 61%, 30mL ISOVUE-300 IOPAMIDOL (ISOVUE-300) INJECTION 61% COMPARISON:  07/21/2016 FINDINGS: Lower chest: Lung bases are clear. Hepatobiliary: No focal hepatic lesion. Mild intra and extrahepatic biliary duct dilatation following cholecystectomy. The common bile duct measures 10 mm in diameter. Pancreas: No pancreatic duct dilatation. The pancreatic parenchyma is normal. Spleen: Normal Adrenals/urinary tract: Adrenal glands and kidneys are normal. The ureters and bladder normal. Stomach/Bowel: Stomach, small-bowel and cecum are normal. Appendix not identified. Colon and rectosigmoid colon are normal. Vascular/Lymphatic: Abdominal aorta is normal caliber with atherosclerotic calcification. There is no retroperitoneal or periportal lymphadenopathy. No pelvic lymphadenopathy. Reproductive: Post hysterectomy anatomy. Other: No ventral hernia or inguinal hernia. Anasarca of the soft tissues of the flanks. Musculoskeletal: No aggressive osseous lesion. Mottled appearance of the bones may relate to chronic renal insufficiency IMPRESSION: 1. No clear explanation for LEFT-sided pain. No diverticulitis or obstructive uropathy. 2. Small bilateral pleural effusions and mild anasarca. 3.  Atherosclerotic calcification of the aorta. 4. Intra and extrahepatic duct dilatation likely relates to prior cholecystectomy. Recommend correlation with bilirubin levels.  Electronically Signed   By: Genevive Bi M.D.   On: 09/18/2016 17:22   Portable Chest X-ray (1 View)  Result Date: 09/20/2016 CLINICAL DATA:  Diabetic ketoacidosis.  Shortness of breath. EXAM: PORTABLE CHEST 1 VIEW COMPARISON:  09/18/2016 FINDINGS: Shallow inspiration. Heart size and pulmonary vascularity are normal. No focal airspace disease or consolidation in the  lungs. No blunting of costophrenic angles. No pneumothorax. Mediastinal contours appear intact. Postoperative changes in the cervical spine. Degenerative changes in the thoracic spine and shoulders. IMPRESSION: No active disease. Electronically Signed   By: Burman Nieves M.D.   On: 09/20/2016 19:43   Ct Maxillofacial Wo Contrast  Result Date: 09/06/2016 CLINICAL DATA:  N/V, 4 episodes of vomit this am, pt has right periorbital bruising, states that she had a dizzy spell and tell last night, no LOC, reports that she did have some vomiting yesterday in addition to the 4 episodes this am, pt is a type 1 diabetic, pt took bld sugar this am and meter registered "high"- pt took 10 units of Humalog at 9 am, per EMS it was 577 en route EXAM: CT HEAD WITHOUT CONTRAST CT MAXILLOFACIAL WITHOUT CONTRAST CT CERVICAL SPINE WITHOUT CONTRAST TECHNIQUE: Multidetector CT imaging of the head, cervical spine, and maxillofacial structures were performed using the standard protocol without intravenous contrast. Multiplanar CT image reconstructions of the cervical spine and maxillofacial structures were also generated. COMPARISON:  07/11/2016 FINDINGS: CT HEAD FINDINGS Brain: Diffuse parenchymal atrophy. Patchy areas of hypoattenuation in deep and periventricular white matter bilaterally. Negative for acute intracranial hemorrhage, mass lesion, acute infarction, midline shift, or mass-effect. Acute infarct may be inapparent on noncontrast CT. Ventricles and sulci symmetric. Vascular: No hyperdense vessel or unexpected calcification. Skull: Normal. Negative for  fracture or focal lesion. Sinuses/Orbits: Frontal sinuses hypoplastic.  No acute finding. Other: CT MAXILLOFACIAL FINDINGS Osseous: No fracture or mandibular dislocation. No destructive process. Orbits: Negative. No traumatic or inflammatory finding. Sinuses: Hypoplastic frontal sinuses. Remainder are normally developed and well aerated. Soft tissues: Negative Limited intracranial: No significant or unexpected finding. CT CERVICAL SPINE FINDINGS Alignment: Loss of the normal lordosis. Skull base and vertebrae: Asymmetric facet DJD and spurring left C2-3, right C3-4 and C4-5 with early foraminal encroachment at these levels. Previous instrumented ACDF C5-6, hardware intact without surrounding lucency, solid-appearing fusion across the interspace. Exuberant anterior bridging osteophytes C7-T1. Soft tissues and spinal canal: Posterior partially calcified protrusions C2-3 and C6-7. Disc levels: Solid fusion across the C5-6 level. There is mild narrowing C6-7. Disc heights well preserved elsewhere. Upper chest: Negative. Other: Bilateral calcified carotid bifurcation plaque appear IMPRESSION: 1. Negative for bleed or other acute intracranial process. 2. Mild brain atrophy with nonspecific white matter changes. 3. No maxillofacial fracture or other acute finding. 4. No cervical fracture or other acute finding. 5. Degenerative and postop changes in the cervical spine as above Electronically Signed   By: Corlis Leak M.D.   On: 09/06/2016 13:16  " Previous radiologic studies were reviewed I personally reviewed the radiologic studies    ED Course:  Patient's stay here was uneventful and the patient's otherwise hemodynamically stable. He apparently will have an opening for the patient tomorrow. We felt we could discharge the patient home tonight and she'll return tomorrow to be admitted at [facility]. Patient appears to be hemodynamically stable and I felt it was unlikely to be any acute pathology at this time.  Patient was discharged home with her family who appears of understanding and advised to return here for is any new complaints or abnormalities such as a fever, etc. Clinical Course     Assessment: * Acute unspecified chest pain*   Final Clinical Impression: *  Final diagnoses:  Nonspecific chest pain     Plan: Outpatient Patient was advised to return immediately if condition worsens. Patient was advised to follow up with their primary care  physician or other specialized physicians involved in their outpatient care. The patient and/or family member/power of attorney had laboratory results reviewed at the bedside. All questions and concerns were addressed and appropriate discharge instructions were distributed by the nursing staff.             Jennye Moccasin, MD 09/26/16 [phone]

## 2016-09-26 NOTE — ED Notes (Signed)
Patient transported to X-ray 

## 2016-09-26 NOTE — ED Notes (Signed)
Family with pt.  md aware.  Pt alert.  Iv in place.  Skin warm and dry.

## 2016-09-28 ENCOUNTER — Emergency Department (HOSPITAL_COMMUNITY): Payer: Medicare Other

## 2016-09-28 ENCOUNTER — Encounter (HOSPITAL_COMMUNITY): Payer: Self-pay | Admitting: Emergency Medicine

## 2016-09-28 ENCOUNTER — Emergency Department (HOSPITAL_COMMUNITY)
Admission: EM | Admit: 2016-09-28 | Discharge: 2016-09-28 | Disposition: A | Discharge status: Home / Self Care | Payer: Medicare Other | Attending: Emergency Medicine | Admitting: Emergency Medicine

## 2016-09-28 DIAGNOSIS — Z8673 Personal history of transient ischemic attack (TIA), and cerebral infarction without residual deficits: Secondary | ICD-10-CM | POA: Insufficient documentation

## 2016-09-28 DIAGNOSIS — E1022 Type 1 diabetes mellitus with diabetic chronic kidney disease: Secondary | ICD-10-CM | POA: Diagnosis not present

## 2016-09-28 DIAGNOSIS — F17210 Nicotine dependence, cigarettes, uncomplicated: Secondary | ICD-10-CM | POA: Diagnosis not present

## 2016-09-28 DIAGNOSIS — R609 Edema, unspecified: Secondary | ICD-10-CM

## 2016-09-28 DIAGNOSIS — I130 Hypertensive heart and chronic kidney disease with heart failure and stage 1 through stage 4 chronic kidney disease, or unspecified chronic kidney disease: Secondary | ICD-10-CM | POA: Insufficient documentation

## 2016-09-28 DIAGNOSIS — E039 Hypothyroidism, unspecified: Secondary | ICD-10-CM | POA: Diagnosis not present

## 2016-09-28 DIAGNOSIS — I5032 Chronic diastolic (congestive) heart failure: Secondary | ICD-10-CM | POA: Diagnosis not present

## 2016-09-28 DIAGNOSIS — R600 Localized edema: Secondary | ICD-10-CM | POA: Diagnosis not present

## 2016-09-28 DIAGNOSIS — Z7982 Long term (current) use of aspirin: Secondary | ICD-10-CM | POA: Diagnosis not present

## 2016-09-28 DIAGNOSIS — N183 Chronic kidney disease, stage 3 (moderate): Secondary | ICD-10-CM | POA: Insufficient documentation

## 2016-09-28 DIAGNOSIS — Z79899 Other long term (current) drug therapy: Secondary | ICD-10-CM | POA: Insufficient documentation

## 2016-09-28 DIAGNOSIS — M7989 Other specified soft tissue disorders: Secondary | ICD-10-CM | POA: Diagnosis present

## 2016-09-28 LAB — CBC WITH DIFFERENTIAL/PLATELET
Basophils Absolute: 0 10*3/uL (ref 0.0–0.1)
Basophils Relative: 0 %
EOS ABS: 0.2 10*3/uL (ref 0.0–0.7)
EOS PCT: 3 %
HCT: 27.2 % — ABNORMAL LOW (ref 36.0–46.0)
Hemoglobin: 9.2 g/dL — ABNORMAL LOW (ref 12.0–15.0)
LYMPHS ABS: 1.7 10*3/uL (ref 0.7–4.0)
LYMPHS PCT: 26 %
MCH: 30.7 pg (ref 26.0–34.0)
MCHC: 33.8 g/dL (ref 30.0–36.0)
MCV: 90.7 fL (ref 78.0–100.0)
MONOS PCT: 9 %
Monocytes Absolute: 0.6 10*3/uL (ref 0.1–1.0)
Neutro Abs: 4.1 10*3/uL (ref 1.7–7.7)
Neutrophils Relative %: 62 %
PLATELETS: 260 10*3/uL (ref 150–400)
RBC: 3 MIL/uL — ABNORMAL LOW (ref 3.87–5.11)
RDW: 14.1 % (ref 11.5–15.5)
WBC: 6.5 10*3/uL (ref 4.0–10.5)

## 2016-09-28 LAB — BASIC METABOLIC PANEL
Anion gap: 9 (ref 5–15)
BUN: 20 mg/dL (ref 6–20)
CHLORIDE: 106 mmol/L (ref 101–111)
CO2: 21 mmol/L — ABNORMAL LOW (ref 22–32)
Calcium: 8 mg/dL — ABNORMAL LOW (ref 8.9–10.3)
Creatinine, Ser: 1.28 mg/dL — ABNORMAL HIGH (ref 0.44–1.00)
GFR calc Af Amer: 48 mL/min — ABNORMAL LOW (ref >60)
GFR calc non Af Amer: 42 mL/min — ABNORMAL LOW (ref >60)
Glucose, Bld: 347 mg/dL — ABNORMAL HIGH (ref 65–99)
POTASSIUM: 4.3 mmol/L (ref 3.5–5.1)
SODIUM: 136 mmol/L (ref 135–145)

## 2016-09-28 LAB — BRAIN NATRIURETIC PEPTIDE: B Natriuretic Peptide: 236 pg/mL — ABNORMAL HIGH (ref 0.0–100.0)

## 2016-09-28 LAB — TROPONIN I

## 2016-09-28 MED ORDER — HYDROCHLOROTHIAZIDE 25 MG PO TABS: 12.5000 mg | ORAL_TABLET | Freq: Every day | ORAL | 0 refills | Status: DC

## 2016-09-28 NOTE — ED Triage Notes (Signed)
Per EMS pt from home c/o pedal and hand edema. Pt states it is worse than usual. Hx of CHF and has been out of lasix for "awhile." Pt reports she needs to go back to see her cardiologist.   Pt adds she has a headache that started at 9 this morning.

## 2016-09-28 NOTE — ED Notes (Signed)
Bed: [id number] Expected date: 09/28/16 Expected time: 2:44 PM Means of arrival: Ambulance Comments: "Fluid Build up"

## 2016-09-28 NOTE — ED Provider Notes (Signed)
[facility] DEPT Provider Note   CSN: [id number] Arrival date & time: 09/28/16  1448     History   Chief Complaint Chief Complaint  Patient presents with  . Leg Swelling    HPI Declynn Lopresti [name] is a 70 y.o. female.  She is here for evaluation of leg swelling. Swelling is ongoing for several days. She states that she has a history of "congestive heart failure", but is not currently taking medications for it. She was recently hospitalized and treated for DKA complicated by vomiting and hyperkalemia. She is trying to get into a "assisted living facility", for which she states that she needs this care, "to keep an eye on my heart". She states that she is taking her usual medications except the muscle relaxer was prescribed when she was discharged recently. She was evaluated in the ED at [facility], 2 days ago for leg swelling. She denies fever, chills, shortness of breath, chest pain, weakness or dizziness. There are no other known modifying factors.  HPI  Past Medical History:  Diagnosis Date  . Allergic rhinitis   . Anxiety   . Benign hypertension with CKD (chronic kidney disease) stage III   . Benign paroxysmal positional vertigo   . Bipolar disorder ([place])   . Broken finger   . Broken shoulder   . Broken toes   . Cervicalgia   . CHF (congestive heart failure) ([place]) 07/2016  . CKD (chronic kidney disease) stage 3, GFR 30-59 ml/min   . Depression   . Diabetes mellitus without complication ([place])   . Elevated liver enzymes Hep B/C neg 2014  . GERD (gastroesophageal reflux disease)   . Glaucoma   . History of alcohol use   . Hypertension   . Hypothyroidism   . Interstitial cystitis    bladder stretched every 9 months  . Migraines   . Psoriasis   . Stroke ([place]) 05/16/2016   Left occipital and thalamic, right hippocampal  . Thyroid disease   . Tobacco use   . 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 Vitamin B12 deficiency     Patient Active  Problem List   Diagnosis Date Noted  . Hyperkalemia   . Non-intractable vomiting   . Gastroesophageal reflux disease   . H/O: CVA (cerebrovascular accident)   . DKA, type 1 ([place]) 09/06/2016  . Hypoglycemia associated with diabetes ([place])   . Orthostatic hypotension   . Protein-calorie malnutrition, severe 08/28/2016  . UTI (lower urinary tract infection) 07/19/2016  . AKI (acute kidney injury) ([place]) 07/19/2016  . Chronic diastolic heart failure ([place]) 07/19/2016  . Dehydration   . Cerebral embolism with cerebral infarction 07/13/2016  . Acute diastolic CHF (congestive heart failure) ([place]) 07/11/2016  . Cerebrovascular accident (CVA) ([place])   . Essential hypertension   . HLD (hyperlipidemia)   . Anemia of chronic disease   . Physical deconditioning   . Acute renal failure superimposed on stage 3 chronic kidney disease ([place])   . Diabetic ketoacidosis with coma associated with type 1 diabetes mellitus ([place])   . History of ETT   . Stroke ([place]) 05/16/2016  . Cerebral thrombosis with cerebral infarction 05/15/2016  . Acute on chronic renal failure ([place])   . Generalized anxiety disorder 05/10/2016  . Diabetic ketoacidosis without coma associated with type 1 diabetes mellitus ([place]) 05/06/2016  . Acute respiratory failure ([place]) 05/06/2016  . Acute respiratory failure with hypoxia ([place]) 05/06/2016  . Acute encephalopathy 05/06/2016  . Septic shock ([place])   .  Acute respiratory failure with hypoxia and hypercapnia (H[facility])   . 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 Depression   . Hypertension   . CKD (chronic kidney disease) stage 3, GFR 30-59 ml/min   . Hypothyroidism   . Vitamin B12 deficiency   . Benign paroxysmal positional vertigo   . Anxiety   . Allergic rhinitis   . Glaucoma   . Benign hypertension with CKD (chronic kidney disease) stage III   . Tobacco abuse   . Cervicalgia   . Elevated liver enzymes   . History of alcohol use     Past Surgical History:    Procedure Laterality Date  . ABDOMINAL HYSTERECTOMY     with oophorectomy  . APPENDECTOMY    . BLADDER SURGERY    . CATARACT EXTRACTION    . CERVICAL DISC SURGERY    . CHOLECYSTECTOMY    . HERNIA REPAIR    . TONSILLECTOMY      OB History    No data available       Home Medications    Prior to Admission medications   Medication Sig Start Date End Date Taking? Authorizing Provider  aspirin 81 MG chewable tablet Chew 1 tablet (81 mg total) by mouth daily. 09/24/16   Barton Dubois, MD  atorvastatin (LIPITOR) 10 MG tablet Take 1 tablet (10 mg total) by mouth daily at 6 PM. 05/19/16   Lavina Hamman, MD  carvedilol (COREG) 3.125 MG tablet Take 1 tablet (3.125 mg total) by mouth 2 (two) times daily with a meal. 07/18/16   Mauricio Gerome Apley, MD  clopidogrel (PLAVIX) 75 MG tablet Take 75 mg by mouth daily.    Historical Provider, MD  escitalopram (LEXAPRO) 20 MG tablet Take 20 mg by mouth daily.    Historical Provider, MD  feeding supplement, GLUCERNA SHAKE, (GLUCERNA SHAKE) LIQD Take 237 mLs by mouth 3 (three) times daily between meals. Patient not taking: Reported on 06/22/2016 05/19/16   Lavina Hamman, MD  HUMALOG 100 UNIT/ML injection Inject 0.05 mLs (5 Units total) into the skin 3 (three) times daily with meals. 09/23/16   Barton Dubois, MD  hydrocortisone (ANUSOL-HC) 2.5 % rectal cream Place rectally 4 (four) times daily. Patient not taking: Reported on 09/18/2016 05/19/16   Lavina Hamman, MD  insulin glargine (LANTUS) 100 UNIT/ML injection Inject 0.08 mLs (8 Units total) into the skin daily. 09/24/16   Barton Dubois, MD  iron polysaccharides (NIFEREX) 150 MG capsule Take 1 capsule (150 mg total) by mouth 2 (two) times daily. Patient taking differently: Take 150 mg by mouth daily.  06/28/16   Barton Dubois, MD  levothyroxine (SYNTHROID, LEVOTHROID) 88 MCG tablet Take 88 mcg by mouth daily before breakfast.    Historical Provider, MD  LORazepam (ATIVAN) 0.5 MG tablet Take 1 tablet (0.5 mg  total) by mouth every 8 (eight) hours as needed for anxiety. 05/19/16   Lavina Hamman, MD  losartan (COZAAR) 25 MG tablet Take 1 tablet (25 mg total) by mouth daily. 09/23/16   Barton Dubois, MD  meclizine (ANTIVERT) 25 MG tablet Take 1 tablet (25 mg total) by mouth 3 (three) times daily as needed for dizziness. 02/12/16   Skeet Latch, MD  ondansetron (ZOFRAN) 4 MG tablet Take 1 tablet (4 mg total) by mouth every 6 (six) hours. 09/18/16   Margette Fast, MD  oxyCODONE-acetaminophen (PERCOCET/ROXICET) 5-325 MG tablet Take 2 tablets by mouth every 4 (four) hours as needed for severe pain.  09/18/16   Margette Fast, MD  pantoprazole (PROTONIX) 40 MG tablet Take 1 tablet (40 mg total) by mouth daily at 12 noon. 09/24/16   Barton Dubois, MD  polyethylene glycol [facility] / GLYCOLAX) packet Take 17 g by mouth every other day. Patient taking differently: Take 17 g by mouth daily as needed for mild constipation.  05/19/16   Lavina Hamman, MD  senna-docusate (SENOKOT-S) 8.6-50 MG tablet Take 1 tablet by mouth daily. 09/18/16   Margette Fast, MD    Family History Family History  Problem Relation Age of Onset  . Alcohol abuse Mother   . Arthritis Mother   . Asthma Mother   . Cancer Mother     colon cancer  . Hypertension Mother   . Migraines Mother   . Stroke Mother   . Lung disease Mother   . COPD Mother   . Diabetes Father   . Hypertension Father   . Heart disease Father   . Heart attack Father   . Heart disease Paternal Grandmother   . Diabetes Paternal Grandmother   . Stroke Paternal Grandmother   . Cancer Paternal Grandmother   . Diabetes Paternal Grandfather     Social History Social History  Substance Use Topics  . Smoking status: Current Every Day Smoker    Packs/day: 0.25    Types: Cigarettes  . Smokeless tobacco: Never Used  . Alcohol use No     Comment: Hasn't had any alcohol since last admission     Allergies   Ciprofloxacin; Hydrocodone; Codeine; Doxycycline; Omnicef  [cefdinir]; and Augmentin [amoxicillin-pot clavulanate]   Review of Systems Review of Systems  All other systems reviewed and are negative.    Physical Exam Updated Vital Signs BP 152/70   Pulse 81   Temp 98.1 F (36.7 C) (Oral)   Resp 15   Ht '5\' 2"'$  (1.575 m)   Wt 110 lb (49.9 kg)   SpO2 94%   BMI 20.12 kg/m   Physical Exam  Constitutional: She is oriented to person, place, and time. She appears well-developed and well-nourished. No distress.  HENT:  Head: Normocephalic and atraumatic.  Eyes: Conjunctivae and EOM are normal. Pupils are equal, round, and reactive to light.  Neck: Normal range of motion and phonation normal. Neck supple.  Cardiovascular: Normal rate and regular rhythm.   Pulmonary/Chest: Effort normal and breath sounds normal. She exhibits no tenderness.  Abdominal: Soft. She exhibits no distension. There is no tenderness. There is no guarding.  Musculoskeletal: Normal range of motion. She exhibits edema (Bilateral pedal and ankle edema, 1+.). She exhibits no tenderness (No calf or leg tenderness.).  Neurological: She is alert and oriented to person, place, and time. She exhibits normal muscle tone.  Skin: Skin is warm and dry.  Psychiatric: She has a normal mood and affect. Her behavior is normal. Judgment and thought content normal.  Nursing note and vitals reviewed.    ED Treatments / Results  Labs (all labs ordered are listed, but only abnormal results are displayed) Labs Reviewed - No data to display  EKG  EKG Interpretation None       Radiology Dg Chest 2 View  Result Date: 09/26/2016 CLINICAL DATA:  Chest pain with headache. Radiates into the right arm. EXAM: CHEST  2 VIEW COMPARISON:  09/20/2016 FINDINGS: There is no focal parenchymal opacity. There is a small right pleural effusion. The heart and mediastinal contours are unremarkable. The osseous structures are unremarkable. IMPRESSION: Small right pleural effusion.  Electronically Signed    By: Kathreen Devoid   On: 09/26/2016 17:33    Procedures Procedures (including critical care time)  Medications Ordered in ED Medications - No data to display   Initial Impression / Assessment and Plan / ED Course  I have reviewed the triage vital signs and the nursing notes.  Pertinent labs & imaging results that were available during my care of the patient were reviewed by me and considered in my medical decision making (see chart for details).  Clinical Course  Value Comment By Time  Glucose: (!) [address] Daleen Bo, MD 10/14 [phone]  Creatinine: (!) 1.28 I Daleen Bo, MD 10/14 1843  EGFR Johnnette Barrios Amer.): (!) [place], [place] 10/14 1843  Hemoglobin: (!) 9.2 [place] Daleen Bo, [place] 10/14 1843   Hemoglobin  Date Value Ref Range Status  09/28/2016 9.2 (L) 12.0 - 15.0 g/dL Final  09/26/2016 9.5 (L) 12.0 - 16.0 g/dL Final  09/23/2016 7.8 (L) 12.0 - 15.0 g/dL Final  09/20/2016 9.8 (L) 12.0 - 15.0 g/dL Final   HGB  Date Value Ref Range Status  08/12/2014 13.7 12.0 - 16.0 g/dL Final  06/15/2014 13.0 12.0 - 16.0 g/dL Final  05/13/2014 12.2 12.0 - 16.0 g/dL Final  02/11/2014 14.2 12.0 - 16.0 g/dL Final   BUN  Date Value Ref Range Status  09/28/2016 20 6 - 20 mg/dL Final  09/26/2016 18 6 - 20 mg/dL Final  09/23/2016 21 (H) 6 - 20 mg/dL Final  09/22/2016 24 (H) 6 - 20 mg/dL Final  06/15/2014 16 7 - 18 mg/dL Final  10/09/2013 6 (L) 7 - 18 mg/dL Final  10/08/2013 8 7 - 18 mg/dL Final  10/07/2013 16 7 - 18 mg/dL Final   Creatinine  Date Value Ref Range Status  06/15/2014 1.17 0.60 - 1.30 mg/dL Final  10/09/2013 0.74 0.60 - 1.30 mg/dL Final  10/08/2013 0.67 0.60 - 1.30 mg/dL Final  10/07/2013 1.07 0.60 - 1.30 mg/dL Final   Creatinine, Ser  Date Value Ref Range Status  09/28/2016 1.28 (H) 0.44 - 1.00 mg/dL Final  09/26/2016 1.24 (H) 0.44 - 1.00 mg/dL Final  09/23/2016 1.23 (H) 0.44 - 1.00 mg/dL Final  09/22/2016 1.46 (H) 0.44 - 1.00 mg/dL Final   Glucose    Date Value Ref Range Status  06/15/2014 134 (H) 65 - 99 mg/dL Final  10/09/2013 176 (H) 65 - 99 mg/dL Final  10/08/2013 130 (H) 65 - 99 mg/dL Final  10/07/2013 225 (H) 65 - 99 mg/dL Final   Glucose, Bld  Date Value Ref Range Status  09/28/2016 347 (H) 65 - 99 mg/dL Final  09/26/2016 157 (H) 65 - 99 mg/dL Final  09/23/2016 74 65 - 99 mg/dL Final  09/22/2016 73 65 - 99 mg/dL Final   Medications - No data to display  Patient Vitals for the past 24 hrs:  BP Temp Temp src Pulse Resp SpO2 Height Weight  09/28/16 1730 144/64 - - 81 13 95 % - -  09/28/16 1630 145/60 - - 83 17 93 % - -  09/28/16 1600 152/70 - - 81 15 94 % - -  09/28/16 1530 151/66 - - 81 17 97 % - -  09/28/16 1502 - - - - - 96 % _0  (1.575 m) 110 lb (49.9 kg)  09/28/16 1459 152/65 98.1 F (36.7 C) Oral 81 18 98 % - -   ECHO Report- 07/14/16 - Left ventricle: The cavity size was normal. There was mild  concentric hypertrophy. Systolic function was normal. The   estimated ejection fraction was in the range of 60% to 65%. Wall   motion was normal; there were no regional wall motion   abnormalities. Doppler parameters are consistent with abnormal   left ventricular relaxation (grade 1 diastolic dysfunction).   Doppler parameters are consistent with high ventricular filling   pressure.   6:47 PM Reevaluation with update and discussion. After initial assessment and treatment, an updated evaluation reveals She is comfortable now, has been up to the bathroom. He has no further complaints. Findings discussed with patient and husband, all questions answered. Noam Franzen L    Final Clinical Impressions(s) / ED Diagnoses   Final diagnoses:  Peripheral edema   Peripheral edema, mild, cause unclear. Possibly nutritional. No evidence for acute congestive heart failure, severe edema, metabolic instability or impending vascular collapse.  Nursing Notes Reviewed/ Care Coordinated Applicable Imaging Reviewed Interpretation  of Laboratory Data incorporated into ED treatment  The patient appears reasonably screened and/or stabilized for discharge and I doubt any other medical condition or other [facility] requiring further screening, evaluation, or treatment in the ED at this time prior to discharge.  Plan: Home Medications- continue; Home Treatments- rest, elevate feet; return here if the recommended treatment, does not improve the symptoms; Recommended follow up- PCP 1 week   New Prescriptions New Prescriptions   No medications on file     Daleen Bo, MD 09/28/16 1856

## 2016-10-01 ENCOUNTER — Emergency Department (HOSPITAL_COMMUNITY)
Admission: EM | Admit: 2016-10-01 | Discharge: 2016-10-01 | Disposition: A | Discharge status: Home / Self Care | Payer: Medicare Other | Source: Home / Self Care | Attending: Emergency Medicine | Admitting: Emergency Medicine

## 2016-10-01 ENCOUNTER — Encounter (HOSPITAL_COMMUNITY): Payer: Self-pay

## 2016-10-01 DIAGNOSIS — E1122 Type 2 diabetes mellitus with diabetic chronic kidney disease: Secondary | ICD-10-CM

## 2016-10-01 DIAGNOSIS — R5381 Other malaise: Secondary | ICD-10-CM

## 2016-10-01 DIAGNOSIS — N183 Chronic kidney disease, stage 3 (moderate): Secondary | ICD-10-CM

## 2016-10-01 DIAGNOSIS — I130 Hypertensive heart and chronic kidney disease with heart failure and stage 1 through stage 4 chronic kidney disease, or unspecified chronic kidney disease: Secondary | ICD-10-CM

## 2016-10-01 DIAGNOSIS — Z794 Long term (current) use of insulin: Secondary | ICD-10-CM

## 2016-10-01 DIAGNOSIS — R5383 Other fatigue: Secondary | ICD-10-CM

## 2016-10-01 DIAGNOSIS — E1011 Type 1 diabetes mellitus with ketoacidosis with coma: Secondary | ICD-10-CM | POA: Diagnosis not present

## 2016-10-01 DIAGNOSIS — E039 Hypothyroidism, unspecified: Secondary | ICD-10-CM

## 2016-10-01 DIAGNOSIS — R197 Diarrhea, unspecified: Secondary | ICD-10-CM | POA: Insufficient documentation

## 2016-10-01 DIAGNOSIS — R4182 Altered mental status, unspecified: Secondary | ICD-10-CM | POA: Diagnosis not present

## 2016-10-01 DIAGNOSIS — Z7982 Long term (current) use of aspirin: Secondary | ICD-10-CM

## 2016-10-01 DIAGNOSIS — R1084 Generalized abdominal pain: Secondary | ICD-10-CM

## 2016-10-01 DIAGNOSIS — R112 Nausea with vomiting, unspecified: Secondary | ICD-10-CM | POA: Insufficient documentation

## 2016-10-01 DIAGNOSIS — Z79899 Other long term (current) drug therapy: Secondary | ICD-10-CM | POA: Insufficient documentation

## 2016-10-01 DIAGNOSIS — I5032 Chronic diastolic (congestive) heart failure: Secondary | ICD-10-CM

## 2016-10-01 DIAGNOSIS — F17210 Nicotine dependence, cigarettes, uncomplicated: Secondary | ICD-10-CM

## 2016-10-01 LAB — URINALYSIS, ROUTINE W REFLEX MICROSCOPIC
GLUCOSE, UA: 250 mg/dL — AB
Ketones, ur: 15 mg/dL — AB
Leukocytes, UA: NEGATIVE
Nitrite: NEGATIVE
SPECIFIC GRAVITY, URINE: 1.02 (ref 1.005–1.030)
pH: 5.5 (ref 5.0–8.0)

## 2016-10-01 LAB — COMPREHENSIVE METABOLIC PANEL
ALBUMIN: 2.5 g/dL — AB (ref 3.5–5.0)
ALT: 15 U/L (ref 14–54)
AST: 25 U/L (ref 15–41)
Alkaline Phosphatase: 67 U/L (ref 38–126)
Anion gap: 13 (ref 5–15)
BUN: 25 mg/dL — AB (ref 6–20)
CHLORIDE: 104 mmol/L (ref 101–111)
CO2: 19 mmol/L — AB (ref 22–32)
CREATININE: 1.76 mg/dL — AB (ref 0.44–1.00)
Calcium: 8.5 mg/dL — ABNORMAL LOW (ref 8.9–10.3)
GFR calc non Af Amer: 28 mL/min — ABNORMAL LOW (ref >60)
GFR, EST AFRICAN AMERICAN: 33 mL/min — AB (ref >60)
GLUCOSE: 142 mg/dL — AB (ref 65–99)
Potassium: 3.6 mmol/L (ref 3.5–5.1)
SODIUM: 136 mmol/L (ref 135–145)
Total Bilirubin: 0.9 mg/dL (ref 0.3–1.2)
Total Protein: 5.4 g/dL — ABNORMAL LOW (ref 6.5–8.1)

## 2016-10-01 LAB — ETHANOL: Alcohol, Ethyl (B): <5 mg/dL (ref <5)

## 2016-10-01 LAB — URINE MICROSCOPIC-ADD ON

## 2016-10-01 LAB — CBC
HCT: 25.9 % — ABNORMAL LOW (ref 36.0–46.0)
Hemoglobin: 9.1 g/dL — ABNORMAL LOW (ref 12.0–15.0)
MCH: 30.7 pg (ref 26.0–34.0)
MCHC: 35.1 g/dL (ref 30.0–36.0)
MCV: 87.5 fL (ref 78.0–100.0)
PLATELETS: 332 10*3/uL (ref 150–400)
RBC: 2.96 MIL/uL — AB (ref 3.87–5.11)
RDW: 14.2 % (ref 11.5–15.5)
WBC: 9.1 10*3/uL (ref 4.0–10.5)

## 2016-10-01 LAB — BLOOD GAS, VENOUS
Acid-base deficit: 3 mmol/L — ABNORMAL HIGH (ref 0.0–2.0)
BICARBONATE: 21.2 mmol/L (ref 20.0–28.0)
O2 SAT: 83.7 %
PATIENT TEMPERATURE: 98.6
PO2 VEN: 58.6 mmHg — AB (ref 32.0–45.0)
pCO2, Ven: 36.3 mmHg — ABNORMAL LOW (ref 44.0–60.0)
pH, Ven: 7.384 (ref 7.250–7.430)

## 2016-10-01 LAB — LIPASE, BLOOD: LIPASE: 42 U/L (ref 11–51)

## 2016-10-01 MED ORDER — ONDANSETRON HCL 4 MG/2ML IJ SOLN
4.0000 mg | Freq: Once | INTRAMUSCULAR | Status: AC
  Administered 2016-10-01: 4 mg via INTRAVENOUS
  Filled 2016-10-01: qty 2

## 2016-10-01 MED ORDER — MORPHINE SULFATE (PF) 4 MG/ML IV SOLN
4.0000 mg | Freq: Once | INTRAVENOUS | Status: AC
Start: 2016-10-01 — End: 2016-10-01
  Administered 2016-10-01: 4 mg via INTRAVENOUS
  Filled 2016-10-01: qty 1

## 2016-10-01 MED ORDER — SODIUM CHLORIDE 0.9 % IV BOLUS (SEPSIS)
1000.0000 mL | Freq: Once | INTRAVENOUS | Status: AC
  Administered 2016-10-01: 1000 mL via INTRAVENOUS

## 2016-10-01 NOTE — Discharge Instructions (Signed)
Please continue to work with PCP to get in to a nursing facility Return if symptoms are getting worse

## 2016-10-01 NOTE — ED Notes (Signed)
Pt left in a wheelchair and escorted by husband.  Pt able to get into car from wheelchair with no issues.  No reaction to medication noted at time of discharge.

## 2016-10-01 NOTE — ED Notes (Signed)
Pt waiting to talk with provider.

## 2016-10-01 NOTE — ED Notes (Signed)
Bed: WA01 Expected date:  Expected time:  Means of arrival:  Comments: EMS 

## 2016-10-01 NOTE — ED Triage Notes (Signed)
PT RECEIVED FROM HOME VIA EMS C/O ABDOMINAL PAIN, NAUSEA, DIARRHEA, AND HEADACHE X1 HR AGO. PER EMS, THE PT HAD A BG OF ABOVE 600 AT HOME AND GAVE HERSELF HUMALOG INSULIN. BG RECHECK PER EMS WAS 213.

## 2016-10-01 NOTE — ED Provider Notes (Signed)
[facility]-EMERGENCY DEPT Provider Note   CSN: [id number] Arrival date & time: 10/01/16  1358     History   Chief Complaint Chief Complaint  Patient presents with  . Abdominal Pain  . Diarrhea    HPI Cassandra [name] is a 70 y.o. female with a complicated medical history presents with abdominal pain, N/V/D for the past day. PMH significant for DM Type 1 on insulin, GERD, CKD stage 3, CHF, depression, deconditioning, hx of CVA. This is her 3rd ED visit since being admitted for DKA with AKI on 10/6-10/9. She is known to have medical non-compliance with her insulin. She reports N/V this morning as well as diarrhea. No recent antibiotics. Her abdominal pain is on the right side. She is a poor historian and unable to characterize but states it is intermittent. She reports associated lightheadedness without syncope. She checked her BG which read over 500. Her husband gave her a dose of her insulin and when rechecked by EMS it was 213. She is s/p cholecystectomy, appendectomy, hysterectomy, hernia repair. Last CT of A&P was on 10/4 which did not show clear explanation for her pain at that time. Denies fever, chills, LOC, HA, chest pain, SOB. Of note, her husband wanted to speak me outside the room and states that she has told him that she wants to be admitted because she believes that will enable her to get in to a nursing home or ALF easier.  HPI  Past Medical History:  Diagnosis Date  . Allergic rhinitis   . Anxiety   . Benign hypertension with CKD (chronic kidney disease) stage III   . Benign paroxysmal positional vertigo   . Bipolar disorder (HCC)   . Broken finger   . Broken shoulder   . Broken toes   . Cervicalgia   . CHF (congestive heart failure) (HCC) 07/2016  . CKD (chronic kidney disease) stage 3, GFR 30-59 ml/min   . Depression   . Diabetes mellitus without complication (HCC)   . Elevated liver enzymes Hep B/C neg 2014  . GERD (gastroesophageal reflux disease)   . Glaucoma   .  History of alcohol use   . Hypertension   . Hypothyroidism   . Interstitial cystitis    bladder stretched every 9 months  . Migraines   . Psoriasis   . Stroke (HCC) 05/16/2016   Left occipital and thalamic, right hippocampal  . Thyroid disease   . Tobacco use   . 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 Vitamin B12 deficiency     Patient Active Problem List   Diagnosis Date Noted  . Hyperkalemia   . Non-intractable vomiting   . Gastroesophageal reflux disease   . H/O: CVA (cerebrovascular accident)   . DKA, type 1 (HCC) 09/06/2016  . Hypoglycemia associated with diabetes (HCC)   . Orthostatic hypotension   . Protein-calorie malnutrition, severe 08/28/2016  . UTI (lower urinary tract infection) 07/19/2016  . AKI (acute kidney injury) (HCC) 07/19/2016  . Chronic diastolic heart failure (HCC) 07/19/2016  . Dehydration   . Cerebral embolism with cerebral infarction 07/13/2016  . Acute diastolic CHF (congestive heart failure) (HCC) 07/11/2016  . Cerebrovascular accident (CVA) (HCC)   . Essential hypertension   . HLD (hyperlipidemia)   . Anemia of chronic disease   . Physical deconditioning   . Acute renal failure superimposed on stage 3 chronic kidney disease (HCC)   . Diabetic ketoacidosis with coma associated with type 1 diabetes mellitus (HCC)   .  History of ETT   . Stroke (HCC) 05/16/2016  . Cerebral thrombosis with cerebral infarction 05/15/2016  . Acute on chronic renal failure (HCC)   . Generalized anxiety disorder 05/10/2016  . Diabetic ketoacidosis without coma associated with type 1 diabetes mellitus (HCC) 05/06/2016  . Acute respiratory failure (HCC) 05/06/2016  . Acute respiratory failure with hypoxia (HCC) 05/06/2016  . Acute encephalopathy 05/06/2016  . Septic shock (HCC)   . Acute respiratory failure with hypoxia and hypercapnia (HCC)   . 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 Depression   . Hypertension   . CKD  (chronic kidney disease) stage 3, GFR 30-59 ml/min   . Hypothyroidism   . Vitamin B12 deficiency   . Benign paroxysmal positional vertigo   . Anxiety   . Allergic rhinitis   . Glaucoma   . Benign hypertension with CKD (chronic kidney disease) stage III   . Tobacco abuse   . Cervicalgia   . Elevated liver enzymes   . History of alcohol use     Past Surgical History:  Procedure Laterality Date  . ABDOMINAL HYSTERECTOMY     with oophorectomy  . APPENDECTOMY    . BLADDER SURGERY    . CATARACT EXTRACTION    . CERVICAL DISC SURGERY    . CHOLECYSTECTOMY    . HERNIA REPAIR    . TONSILLECTOMY      OB History    No data available       Home Medications    Prior to Admission medications   Medication Sig Start Date End Date Taking? Authorizing Provider  aspirin 81 MG chewable tablet Chew 1 tablet (81 mg total) by mouth daily. 09/24/16   Vassie Loll, MD  atorvastatin (LIPITOR) 10 MG tablet Take 1 tablet (10 mg total) by mouth daily at 6 PM. 05/19/16   Rolly Salter, MD  carvedilol (COREG) 3.125 MG tablet Take 1 tablet (3.125 mg total) by mouth 2 (two) times daily with a meal. 07/18/16   Mauricio Annett Gula, MD  clopidogrel (PLAVIX) 75 MG tablet Take 75 mg by mouth daily.    Historical Provider, MD  escitalopram (LEXAPRO) 20 MG tablet Take 20 mg by mouth daily.    Historical Provider, MD  feeding supplement, GLUCERNA SHAKE, (GLUCERNA SHAKE) LIQD Take 237 mLs by mouth 3 (three) times daily between meals. Patient not taking: Reported on 09/28/2016 05/19/16   Rolly Salter, MD  HUMALOG 100 UNIT/ML injection Inject 0.05 mLs (5 Units total) into the skin 3 (three) times daily with meals. 09/23/16   Vassie Loll, MD  hydrochlorothiazide (HYDRODIURIL) 25 MG tablet Take 0.5 tablets (12.5 mg total) by mouth daily. 09/28/16   Mancel Bale, MD  hydrocortisone (ANUSOL-HC) 2.5 % rectal cream Place rectally 4 (four) times daily. Patient not taking: Reported on 09/18/2016 05/19/16   Rolly Salter,  MD  insulin glargine (LANTUS) 100 UNIT/ML injection Inject 0.08 mLs (8 Units total) into the skin daily. 09/24/16   Vassie Loll, MD  iron polysaccharides (NIFEREX) 150 MG capsule Take 1 capsule (150 mg total) by mouth 2 (two) times daily. Patient taking differently: Take 150 mg by mouth daily.  06/28/16   Vassie Loll, MD  levothyroxine (SYNTHROID, LEVOTHROID) 88 MCG tablet Take 88 mcg by mouth daily before breakfast.    Historical Provider, MD  LORazepam (ATIVAN) 0.5 MG tablet Take 1 tablet (0.5 mg total) by mouth every 8 (eight) hours as needed for anxiety. 05/19/16   Rolly Salter,  MD  losartan (COZAAR) 25 MG tablet Take 1 tablet (25 mg total) by mouth daily. 09/23/16   Vassie Loll, MD  meclizine (ANTIVERT) 25 MG tablet Take 1 tablet (25 mg total) by mouth 3 (three) times daily as needed for dizziness. 02/12/16   Chilton Si, MD  ondansetron (ZOFRAN) 4 MG tablet Take 1 tablet (4 mg total) by mouth every 6 (six) hours. 09/18/16   Maia Plan, MD  oxyCODONE-acetaminophen (PERCOCET/ROXICET) 5-325 MG tablet Take 2 tablets by mouth every 4 (four) hours as needed for severe pain. 09/18/16   Maia Plan, MD  pantoprazole (PROTONIX) 40 MG tablet Take 1 tablet (40 mg total) by mouth daily at 12 noon. 09/24/16   Vassie Loll, MD  polyethylene glycol [facility] / GLYCOLAX) packet Take 17 g by mouth every other day. Patient taking differently: Take 17 g by mouth daily as needed for mild constipation.  05/19/16   Rolly Salter, MD  senna-docusate (SENOKOT-S) 8.6-50 MG tablet Take 1 tablet by mouth daily. 09/18/16   Maia Plan, MD    Family History Family History  Problem Relation Age of Onset  . Alcohol abuse Mother   . Arthritis Mother   . Asthma Mother   . Cancer Mother     colon cancer  . Hypertension Mother   . Migraines Mother   . Stroke Mother   . Lung disease Mother   . COPD Mother   . Diabetes Father   . Hypertension Father   . Heart disease Father   . Heart attack Father   .  Heart disease Paternal Grandmother   . Diabetes Paternal Grandmother   . Stroke Paternal Grandmother   . Cancer Paternal Grandmother   . Diabetes Paternal Grandfather     Social History Social History  Substance Use Topics  . Smoking status: Current Every Day Smoker    Packs/day: 0.25    Types: Cigarettes  . Smokeless tobacco: Never Used  . Alcohol use No     Comment: Hasn't had any alcohol since last admission     Allergies   Ciprofloxacin; Hydrocodone; Codeine; Doxycycline; Omnicef [cefdinir]; and Augmentin [amoxicillin-pot clavulanate]   Review of Systems Review of Systems  Constitutional: Negative for chills and fever.  Respiratory: Negative for shortness of breath.   Cardiovascular: Negative for chest pain.  Gastrointestinal: Positive for abdominal pain, diarrhea, nausea and vomiting.  Genitourinary: Negative for dysuria and flank pain.  Neurological: Positive for light-headedness.  All other systems reviewed and are negative.    Physical Exam Updated Vital Signs BP 139/63 (BP Location: Right Arm)   Pulse 88   Temp 98.2 F (36.8 C) (Oral)   Resp 17   Ht 5\' 2"  (1.575 m)   Wt 54.4 kg   SpO2 100%   BMI 21.95 kg/m   Physical Exam  Constitutional: She is oriented to person, place, and time. No distress.  Chronically ill appearing. Appears older than stated age and fatigued  HENT:  Head: Normocephalic and atraumatic.  Eyes: Conjunctivae are normal. Pupils are equal, round, and reactive to light. Right eye exhibits no discharge. Left eye exhibits no discharge. No scleral icterus.  Neck: Normal range of motion. Neck supple.  Cardiovascular: Normal rate and regular rhythm.  Exam reveals no gallop and no friction rub.   No murmur heard. Pulmonary/Chest: Effort normal and breath sounds normal. No respiratory distress. She has no wheezes. She has no rales. She exhibits no tenderness.  Abdominal: Soft. Bowel sounds are normal. She  exhibits no distension and no mass.  There is tenderness. There is no rebound and no guarding. No hernia.  Diffuse rash consistent with psoriasis with excoriations. Diffuse tenderness to palpation without rebound  Musculoskeletal: She exhibits no edema.  Neurological: She is alert and oriented to person, place, and time.  Skin: Skin is warm and dry.  Psychiatric: She has a normal mood and affect. Her behavior is normal.  Nursing note and vitals reviewed.    ED Treatments / Results  Labs (all labs ordered are listed, but only abnormal results are displayed) Labs Reviewed  COMPREHENSIVE METABOLIC PANEL - Abnormal; Notable for the following:       Result Value   CO2 19 (*)    Glucose, Bld 142 (*)    BUN 25 (*)    Creatinine, Ser 1.76 (*)    Calcium 8.5 (*)    Total Protein 5.4 (*)    Albumin 2.5 (*)    GFR calc non Af Amer 28 (*)    GFR calc Af Amer 33 (*)    All other components within normal limits  CBC - Abnormal; Notable for the following:    RBC 2.96 (*)    Hemoglobin 9.1 (*)    HCT 25.9 (*)    All other components within normal limits  LIPASE, BLOOD  URINALYSIS, ROUTINE W REFLEX MICROSCOPIC (NOT AT [facility])  BLOOD GAS, VENOUS    EKG  EKG Interpretation None       Radiology No results found.  Procedures Procedures (including critical care time)  Medications Ordered in ED Medications  sodium chloride 0.9 % bolus 1,000 mL (0 mLs Intravenous Stopped 10/01/16 1959)  ondansetron (ZOFRAN) injection 4 mg (4 mg Intravenous Given 10/01/16 1706)  morphine 4 MG/ML injection 4 mg (4 mg Intravenous Given 10/01/16 1706)     Initial Impression / Assessment and Plan / ED Course  I have reviewed the triage vital signs and the nursing notes.  Pertinent labs & imaging results that were available during my care of the patient were reviewed by me and considered in my medical decision making (see chart for details).  Clinical Course   70 year old female presents with abdominal pain, N/V/D. Her symptoms do not  seem acute although she is mildly dehydrated. Seems the most likely reason she is here is for assistance getting in to a SNF. CM contacted - please see their note for details. Patient is afebrile, not tachycardic or tachypneic, normotensive, and not hypoxic. CBC remarkable for stable anemia. CMP remarkable for slight increase in BUN/SCr from 20/1.28 4 days ago to 25/1.76 today. 1L IVF given. No anion gap. Glucose is 142. UA remarkable for glucose 250, trace hgb, moderate bilirubin, 15 ketones, >300 protein which appears improved. Pain and nausea treated in ED.   Shared visit with Dr. Silverio Lay. Will d/c patient to home and encouraged PCP follow up for placement issues. Patient is NAD, non-toxic, with stable VS. Patient is informed of clinical course, understands medical decision making process, and agrees with plan. Opportunity for questions provided and all questions answered. Return precautions given.    Final Clinical Impressions(s) / ED Diagnoses   Final diagnoses:  Generalized abdominal pain  Physical deconditioning    New Prescriptions New Prescriptions   No medications on file     Bethel Born, PA-C 10/02/16 1359    Rolland Porter, MD 10/14/16 2113

## 2016-10-01 NOTE — Progress Notes (Addendum)
[facility]  EDCM placed social work consult.  Patient noted to be admitted 7 times within the last 6 months.  Last admission 10/06 to 10/09.  [facility]  EDCM called and spoke to [place]wina of [facility] who reports since patient has been discharged, the patient has not been answering th phone, and will not let the agency in to see the patient.  Edwina reports the patient's husband reports the patient's doctor was writing a letter to get her into a facility and [facility].  [facility] is unable to provide home health services to this patient at this time.  10/01/2016 A.Kinisha Soper 1710pm  EDCM spoke to patient at bedside.  Patient reports she would like to go to a nursing facility.  She reports she has no one at home who can take care of her.  Patient lives with her husband.  She reports she has two daughters one lives in [place] and the other lives in [place].  She reports the daughter in [place] "handles all of my business."  She reports her daughter  has assisted patient to apply for Medicaid last week.  EDCM offered to arrange home health services for patient so that social worker can assist patient to get into a facility from the community.  Patient refused home health services at this time.  She reports, "I don't want that I have had that 100 times."  [facility] information for [facility] connections to assist her to get into a facility from the community.  Patient confirms that she is working with her pcp to get into [facility].  She reports she is checking her blood sugar at home and taking her insulin.  No further EDCM needs at this time.

## 2016-10-02 ENCOUNTER — Inpatient Hospital Stay (HOSPITAL_COMMUNITY)
Admission: EM | Admit: 2016-10-02 | Discharge: 2016-10-09 | DRG: 637 | Disposition: A | Discharge status: Skilled Nursing Facility | Payer: Medicare Other | Attending: Internal Medicine | Admitting: Internal Medicine

## 2016-10-02 ENCOUNTER — Encounter (HOSPITAL_COMMUNITY): Payer: Self-pay | Admitting: Emergency Medicine

## 2016-10-02 ENCOUNTER — Emergency Department (HOSPITAL_COMMUNITY): Payer: Medicare Other

## 2016-10-02 DIAGNOSIS — N183 Chronic kidney disease, stage 3 unspecified: Secondary | ICD-10-CM | POA: Diagnosis present

## 2016-10-02 DIAGNOSIS — Z8249 Family history of ischemic heart disease and other diseases of the circulatory system: Secondary | ICD-10-CM

## 2016-10-02 DIAGNOSIS — E1110 Type 2 diabetes mellitus with ketoacidosis without coma: Secondary | ICD-10-CM | POA: Diagnosis present

## 2016-10-02 DIAGNOSIS — Z9119 Patient's noncompliance with other medical treatment and regimen: Secondary | ICD-10-CM

## 2016-10-02 DIAGNOSIS — Z7982 Long term (current) use of aspirin: Secondary | ICD-10-CM

## 2016-10-02 DIAGNOSIS — F329 Major depressive disorder, single episode, unspecified: Secondary | ICD-10-CM | POA: Diagnosis not present

## 2016-10-02 DIAGNOSIS — Z79899 Other long term (current) drug therapy: Secondary | ICD-10-CM

## 2016-10-02 DIAGNOSIS — E1022 Type 1 diabetes mellitus with diabetic chronic kidney disease: Secondary | ICD-10-CM | POA: Diagnosis present

## 2016-10-02 DIAGNOSIS — R7881 Bacteremia: Secondary | ICD-10-CM | POA: Diagnosis present

## 2016-10-02 DIAGNOSIS — Z794 Long term (current) use of insulin: Secondary | ICD-10-CM | POA: Diagnosis not present

## 2016-10-02 DIAGNOSIS — D696 Thrombocytopenia, unspecified: Secondary | ICD-10-CM | POA: Diagnosis not present

## 2016-10-02 DIAGNOSIS — Z7902 Long term (current) use of antithrombotics/antiplatelets: Secondary | ICD-10-CM

## 2016-10-02 DIAGNOSIS — F17210 Nicotine dependence, cigarettes, uncomplicated: Secondary | ICD-10-CM | POA: Diagnosis present

## 2016-10-02 DIAGNOSIS — Z825 Family history of asthma and other chronic lower respiratory diseases: Secondary | ICD-10-CM | POA: Diagnosis not present

## 2016-10-02 DIAGNOSIS — Z8673 Personal history of transient ischemic attack (TIA), and cerebral infarction without residual deficits: Secondary | ICD-10-CM

## 2016-10-02 DIAGNOSIS — D509 Iron deficiency anemia, unspecified: Secondary | ICD-10-CM | POA: Diagnosis present

## 2016-10-02 DIAGNOSIS — N179 Acute kidney failure, unspecified: Secondary | ICD-10-CM | POA: Diagnosis present

## 2016-10-02 DIAGNOSIS — E039 Hypothyroidism, unspecified: Secondary | ICD-10-CM | POA: Diagnosis present

## 2016-10-02 DIAGNOSIS — Z833 Family history of diabetes mellitus: Secondary | ICD-10-CM

## 2016-10-02 DIAGNOSIS — Z823 Family history of stroke: Secondary | ICD-10-CM | POA: Diagnosis not present

## 2016-10-02 DIAGNOSIS — I959 Hypotension, unspecified: Secondary | ICD-10-CM | POA: Diagnosis present

## 2016-10-02 DIAGNOSIS — N189 Chronic kidney disease, unspecified: Secondary | ICD-10-CM

## 2016-10-02 DIAGNOSIS — K219 Gastro-esophageal reflux disease without esophagitis: Secondary | ICD-10-CM | POA: Diagnosis present

## 2016-10-02 DIAGNOSIS — F32A Depression, unspecified: Secondary | ICD-10-CM | POA: Diagnosis present

## 2016-10-02 DIAGNOSIS — I9589 Other hypotension: Secondary | ICD-10-CM

## 2016-10-02 DIAGNOSIS — E10649 Type 1 diabetes mellitus with hypoglycemia without coma: Secondary | ICD-10-CM | POA: Diagnosis present

## 2016-10-02 DIAGNOSIS — B9689 Other specified bacterial agents as the cause of diseases classified elsewhere: Secondary | ICD-10-CM | POA: Diagnosis present

## 2016-10-02 DIAGNOSIS — K3184 Gastroparesis: Secondary | ICD-10-CM | POA: Diagnosis present

## 2016-10-02 DIAGNOSIS — I10 Essential (primary) hypertension: Secondary | ICD-10-CM | POA: Diagnosis not present

## 2016-10-02 DIAGNOSIS — E1043 Type 1 diabetes mellitus with diabetic autonomic (poly)neuropathy: Secondary | ICD-10-CM | POA: Diagnosis present

## 2016-10-02 DIAGNOSIS — Z9049 Acquired absence of other specified parts of digestive tract: Secondary | ICD-10-CM

## 2016-10-02 DIAGNOSIS — E1011 Type 1 diabetes mellitus with ketoacidosis with coma: Principal | ICD-10-CM | POA: Diagnosis present

## 2016-10-02 DIAGNOSIS — H409 Unspecified glaucoma: Secondary | ICD-10-CM | POA: Diagnosis present

## 2016-10-02 DIAGNOSIS — R7401 Elevation of levels of liver transaminase levels: Secondary | ICD-10-CM

## 2016-10-02 DIAGNOSIS — I129 Hypertensive chronic kidney disease with stage 1 through stage 4 chronic kidney disease, or unspecified chronic kidney disease: Secondary | ICD-10-CM | POA: Diagnosis present

## 2016-10-02 DIAGNOSIS — Z811 Family history of alcohol abuse and dependence: Secondary | ICD-10-CM

## 2016-10-02 DIAGNOSIS — R4182 Altered mental status, unspecified: Secondary | ICD-10-CM | POA: Diagnosis present

## 2016-10-02 DIAGNOSIS — F319 Bipolar disorder, unspecified: Secondary | ICD-10-CM | POA: Diagnosis present

## 2016-10-02 DIAGNOSIS — G9340 Encephalopathy, unspecified: Secondary | ICD-10-CM | POA: Diagnosis present

## 2016-10-02 DIAGNOSIS — D6959 Other secondary thrombocytopenia: Secondary | ICD-10-CM | POA: Diagnosis present

## 2016-10-02 DIAGNOSIS — N390 Urinary tract infection, site not specified: Secondary | ICD-10-CM | POA: Diagnosis not present

## 2016-10-02 DIAGNOSIS — Z800 Family history of malignant neoplasm of digestive organs: Secondary | ICD-10-CM

## 2016-10-02 DIAGNOSIS — E875 Hyperkalemia: Secondary | ICD-10-CM | POA: Diagnosis present

## 2016-10-02 DIAGNOSIS — E1010 Type 1 diabetes mellitus with ketoacidosis without coma: Secondary | ICD-10-CM

## 2016-10-02 DIAGNOSIS — M7989 Other specified soft tissue disorders: Secondary | ICD-10-CM | POA: Diagnosis not present

## 2016-10-02 DIAGNOSIS — R740 Nonspecific elevation of levels of transaminase and lactic acid dehydrogenase [LDH]: Secondary | ICD-10-CM

## 2016-10-02 DIAGNOSIS — R1310 Dysphagia, unspecified: Secondary | ICD-10-CM

## 2016-10-02 DIAGNOSIS — R509 Fever, unspecified: Secondary | ICD-10-CM

## 2016-10-02 DIAGNOSIS — E034 Atrophy of thyroid (acquired): Secondary | ICD-10-CM | POA: Diagnosis not present

## 2016-10-02 LAB — BASIC METABOLIC PANEL
Anion gap: 19 — ABNORMAL HIGH (ref 5–15)
BUN: 33 mg/dL — AB (ref 6–20)
BUN: 34 mg/dL — ABNORMAL HIGH (ref 6–20)
BUN: 31 mg/dL — ABNORMAL HIGH (ref 6–20)
CALCIUM: 7.4 mg/dL — AB (ref 8.9–10.3)
CALCIUM: 7.2 mg/dL — AB (ref 8.9–10.3)
CHLORIDE: 104 mmol/L (ref 101–111)
CHLORIDE: 108 mmol/L (ref 101–111)
CHLORIDE: 115 mmol/L — AB (ref 101–111)
CO2: <7 mmol/L — ABNORMAL LOW (ref 22–32)
CO2: <7 mmol/L — ABNORMAL LOW (ref 22–32)
CO2: 7 mmol/L — ABNORMAL LOW (ref 22–32)
CREATININE: 2.57 mg/dL — AB (ref 0.44–1.00)
CREATININE: 2.58 mg/dL — AB (ref 0.44–1.00)
Calcium: 7.5 mg/dL — ABNORMAL LOW (ref 8.9–10.3)
Creatinine, Ser: 2.44 mg/dL — ABNORMAL HIGH (ref 0.44–1.00)
GFR, EST AFRICAN AMERICAN: 22 mL/min — AB (ref >60)
GFR, EST AFRICAN AMERICAN: 21 mL/min — AB (ref >60)
GFR, EST AFRICAN AMERICAN: 21 mL/min — AB (ref >60)
GFR, EST NON AFRICAN AMERICAN: 19 mL/min — AB (ref >60)
GFR, EST NON AFRICAN AMERICAN: 18 mL/min — AB (ref >60)
GFR, EST NON AFRICAN AMERICAN: 18 mL/min — AB (ref >60)
Glucose, Bld: 769 mg/dL (ref 65–99)
Glucose, Bld: 534 mg/dL (ref 65–99)
Glucose, Bld: 264 mg/dL — ABNORMAL HIGH (ref 65–99)
POTASSIUM: 5.4 mmol/L — AB (ref 3.5–5.1)
Potassium: 4.1 mmol/L (ref 3.5–5.1)
Potassium: 4.8 mmol/L (ref 3.5–5.1)
SODIUM: 135 mmol/L (ref 135–145)
SODIUM: 137 mmol/L (ref 135–145)
SODIUM: 141 mmol/L (ref 135–145)

## 2016-10-02 LAB — CBC
HCT: 28.3 % — ABNORMAL LOW (ref 36.0–46.0)
Hemoglobin: 8.8 g/dL — ABNORMAL LOW (ref 12.0–15.0)
MCH: 30.6 pg (ref 26.0–34.0)
MCHC: 31.1 g/dL (ref 30.0–36.0)
MCV: 98.3 fL (ref 78.0–100.0)
Platelets: 359 10*3/uL (ref 150–400)
RBC: 2.88 MIL/uL — ABNORMAL LOW (ref 3.87–5.11)
RDW: 14.7 % (ref 11.5–15.5)
WBC: 13.4 10*3/uL — ABNORMAL HIGH (ref 4.0–10.5)

## 2016-10-02 LAB — URINE MICROSCOPIC-ADD ON

## 2016-10-02 LAB — CBG MONITORING, ED
GLUCOSE-CAPILLARY: 552 mg/dL — AB (ref 65–99)
Glucose-Capillary: >600 mg/dL (ref 65–99)
Glucose-Capillary: 443 mg/dL — ABNORMAL HIGH (ref 65–99)

## 2016-10-02 LAB — GLUCOSE, CAPILLARY
GLUCOSE-CAPILLARY: 336 mg/dL — AB (ref 65–99)
Glucose-Capillary: 168 mg/dL — ABNORMAL HIGH (ref 65–99)
Glucose-Capillary: 189 mg/dL — ABNORMAL HIGH (ref 65–99)
Glucose-Capillary: 278 mg/dL — ABNORMAL HIGH (ref 65–99)
Glucose-Capillary: 389 mg/dL — ABNORMAL HIGH (ref 65–99)

## 2016-10-02 LAB — BLOOD GAS, VENOUS
O2 Saturation: 69.1 %
Patient temperature: 98.6
pH, Ven: 6.979 — CL (ref 7.250–7.430)
pO2, Ven: 48.7 mmHg — ABNORMAL HIGH (ref 32.0–45.0)

## 2016-10-02 LAB — URINALYSIS, ROUTINE W REFLEX MICROSCOPIC
Bilirubin Urine: NEGATIVE
Glucose, UA: >1000 mg/dL — AB
Ketones, ur: NEGATIVE mg/dL
Leukocytes, UA: NEGATIVE
Nitrite: NEGATIVE
Protein, ur: >300 mg/dL — AB
Specific Gravity, Urine: 1.021 (ref 1.005–1.030)
pH: 5.5 (ref 5.0–8.0)

## 2016-10-02 LAB — TROPONIN I: Troponin I: <0.03 ng/mL (ref <0.03)

## 2016-10-02 LAB — MRSA PCR SCREENING: MRSA by PCR: NEGATIVE

## 2016-10-02 MED ORDER — SODIUM CHLORIDE 0.9 % IV SOLN
INTRAVENOUS | Status: DC
  Administered 2016-10-02 (×2): via INTRAVENOUS

## 2016-10-02 MED ORDER — INSULIN REGULAR HUMAN 100 UNIT/ML IJ SOLN
INTRAMUSCULAR | Status: DC
  Administered 2016-10-02: 15.3 [IU]/h via INTRAVENOUS
  Filled 2016-10-02: qty 2.5

## 2016-10-02 MED ORDER — ASPIRIN 81 MG PO CHEW
81.0000 mg | CHEWABLE_TABLET | Freq: Every day | ORAL | Status: DC
  Administered 2016-10-03 – 2016-10-09 (×7): 81 mg via ORAL
  Filled 2016-10-02 (×7): qty 1

## 2016-10-02 MED ORDER — CARVEDILOL 3.125 MG PO TABS
3.1250 mg | ORAL_TABLET | Freq: Two times a day (BID) | ORAL | Status: DC
  Administered 2016-10-02 – 2016-10-09 (×13): 3.125 mg via ORAL
  Filled 2016-10-02 (×13): qty 1

## 2016-10-02 MED ORDER — ORAL CARE MOUTH RINSE
15.0000 mL | Freq: Two times a day (BID) | OROMUCOSAL | Status: DC
  Administered 2016-10-03: 15 mL via OROMUCOSAL

## 2016-10-02 MED ORDER — SODIUM CHLORIDE 0.9 % IV BOLUS (SEPSIS)
1000.0000 mL | Freq: Once | INTRAVENOUS | Status: AC
  Administered 2016-10-02: 1000 mL via INTRAVENOUS

## 2016-10-02 MED ORDER — DEXTROSE-NACL 5-0.45 % IV SOLN
INTRAVENOUS | Status: DC
  Administered 2016-10-02: 23:00:00 via INTRAVENOUS

## 2016-10-02 MED ORDER — ENOXAPARIN SODIUM 30 MG/0.3ML ~~LOC~~ SOLN
30.0000 mg | SUBCUTANEOUS | Status: DC
  Administered 2016-10-02 – 2016-10-05 (×4): 30 mg via SUBCUTANEOUS
  Filled 2016-10-02 (×4): qty 0.3

## 2016-10-02 MED ORDER — CLOPIDOGREL BISULFATE 75 MG PO TABS
75.0000 mg | ORAL_TABLET | Freq: Every day | ORAL | Status: DC
  Administered 2016-10-03 – 2016-10-09 (×7): 75 mg via ORAL
  Filled 2016-10-02 (×8): qty 1

## 2016-10-02 MED ORDER — SODIUM CHLORIDE 0.9 % IV SOLN: INTRAVENOUS | Status: DC

## 2016-10-02 MED ORDER — SODIUM CHLORIDE 0.9 % IV SOLN
INTRAVENOUS | Status: DC
  Administered 2016-10-02: 5.4 [IU]/h via INTRAVENOUS
  Filled 2016-10-02: qty 2.5

## 2016-10-02 MED ORDER — SODIUM CHLORIDE 0.9 % IV SOLN: INTRAVENOUS | Status: AC

## 2016-10-02 MED ORDER — SODIUM CHLORIDE 0.9 % IV BOLUS (SEPSIS)
1000.0000 mL | Freq: Once | INTRAVENOUS | Status: AC
Start: 2016-10-02 — End: 2016-10-02
  Administered 2016-10-02: 1000 mL via INTRAVENOUS

## 2016-10-02 MED ORDER — POLYETHYLENE GLYCOL 3350 17 G PO PACK: 17.0000 g | PACK | Freq: Every day | ORAL | Status: DC | PRN

## 2016-10-02 MED ORDER — LORAZEPAM 0.5 MG PO TABS: 0.5000 mg | ORAL_TABLET | Freq: Three times a day (TID) | ORAL | Status: DC | PRN

## 2016-10-02 MED ORDER — LEVOTHYROXINE SODIUM 88 MCG PO TABS
88.0000 ug | ORAL_TABLET | Freq: Every day | ORAL | Status: DC
Start: 2016-10-03 — End: 2016-10-06
  Administered 2016-10-03 – 2016-10-06 (×4): 88 ug via ORAL
  Filled 2016-10-02 (×4): qty 1

## 2016-10-02 MED ORDER — ESCITALOPRAM OXALATE 10 MG PO TABS
20.0000 mg | ORAL_TABLET | Freq: Every day | ORAL | Status: DC
  Administered 2016-10-03 – 2016-10-09 (×7): 20 mg via ORAL
  Filled 2016-10-02 (×7): qty 2

## 2016-10-02 MED ORDER — ATORVASTATIN CALCIUM 10 MG PO TABS
10.0000 mg | ORAL_TABLET | Freq: Every day | ORAL | Status: DC
  Administered 2016-10-04 – 2016-10-08 (×5): 10 mg via ORAL
  Filled 2016-10-02 (×5): qty 1

## 2016-10-02 MED ORDER — OXYCODONE-ACETAMINOPHEN 5-325 MG PO TABS
1.0000 | ORAL_TABLET | ORAL | Status: DC | PRN
  Administered 2016-10-04 – 2016-10-09 (×6): 1 via ORAL
  Filled 2016-10-02 (×6): qty 1

## 2016-10-02 MED ORDER — AMLODIPINE BESYLATE 5 MG PO TABS
5.0000 mg | ORAL_TABLET | Freq: Every day | ORAL | Status: DC
  Administered 2016-10-03 – 2016-10-07 (×5): 5 mg via ORAL
  Filled 2016-10-02 (×5): qty 1

## 2016-10-02 MED ORDER — POLYSACCHARIDE IRON COMPLEX 150 MG PO CAPS
150.0000 mg | ORAL_CAPSULE | Freq: Two times a day (BID) | ORAL | Status: DC
Start: 2016-10-02 — End: 2016-10-09
  Administered 2016-10-03 – 2016-10-09 (×12): 150 mg via ORAL
  Filled 2016-10-02 (×15): qty 1

## 2016-10-02 MED ORDER — PANTOPRAZOLE SODIUM 40 MG PO TBEC
40.0000 mg | DELAYED_RELEASE_TABLET | Freq: Every day | ORAL | Status: DC
  Administered 2016-10-02 – 2016-10-04 (×3): 40 mg via ORAL
  Filled 2016-10-02 (×3): qty 1

## 2016-10-02 MED ORDER — ONDANSETRON HCL 4 MG/2ML IJ SOLN
4.0000 mg | Freq: Three times a day (TID) | INTRAMUSCULAR | Status: AC | PRN
  Administered 2016-10-02: 4 mg via INTRAVENOUS
  Filled 2016-10-02: qty 2

## 2016-10-02 MED ORDER — CHLORHEXIDINE GLUCONATE 0.12 % MT SOLN
15.0000 mL | Freq: Two times a day (BID) | OROMUCOSAL | Status: DC
  Administered 2016-10-03: 15 mL via OROMUCOSAL
  Filled 2016-10-02: qty 15

## 2016-10-02 MED ORDER — DEXTROSE-NACL 5-0.45 % IV SOLN
INTRAVENOUS | Status: DC
  Administered 2016-10-03: 07:00:00 via INTRAVENOUS

## 2016-10-02 NOTE — ED Notes (Signed)
BG [phone]

## 2016-10-02 NOTE — ED Notes (Signed)
She remains awake and confused. She tells me she is "ok" when I ask her how she is feeling.

## 2016-10-02 NOTE — ED Provider Notes (Signed)
[facility]-EMERGENCY DEPT Provider Note   CSN: [id number] Arrival date & time: 10/02/16  1221     History   Chief Complaint Chief Complaint  Patient presents with  . Hyperglycemia    HPI Cassandra [name] is a 70 y.o. female who presents with hyperglycemia. Level V cavity secondary to patient's altered mental status. Per noted yesterday, patient was seen with blood glucose in the mid 100s. Patient was sent home and encouraged to take her medications, however per EMS she has not taken any medications or even drink anything. Patient reports diarrhea, but denies pain elsewhere.  HPI  Past Medical History:  Diagnosis Date  . Allergic rhinitis   . Anxiety   . Benign hypertension with CKD (chronic kidney disease) stage III   . Benign paroxysmal positional vertigo   . Bipolar disorder (HCC)   . Broken finger   . Broken shoulder   . Broken toes   . Cervicalgia   . CHF (congestive heart failure) (HCC) 07/2016  . CKD (chronic kidney disease) stage 3, GFR 30-59 ml/min   . Depression   . Diabetes mellitus without complication (HCC)   . Elevated liver enzymes Hep B/C neg 2014  . GERD (gastroesophageal reflux disease)   . Glaucoma   . History of alcohol use   . Hypertension   . Hypothyroidism   . Interstitial cystitis    bladder stretched every 9 months  . Migraines   . Psoriasis   . Stroke (HCC) 05/16/2016   Left occipital and thalamic, right hippocampal  . Thyroid disease   . Tobacco use   . 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 Vitamin B12 deficiency     Patient Active Problem List   Diagnosis Date Noted  . Hyperkalemia   . Non-intractable vomiting   . Gastroesophageal reflux disease   . H/O: CVA (cerebrovascular accident)   . DKA, type 1 (HCC) 09/06/2016  . Hypoglycemia associated with diabetes (HCC)   . Orthostatic hypotension   . Protein-calorie malnutrition, severe 08/28/2016  . UTI (lower urinary tract infection) 07/19/2016  . AKI (acute kidney  injury) (HCC) 07/19/2016  . Chronic diastolic heart failure (HCC) 07/19/2016  . Dehydration   . Cerebral embolism with cerebral infarction 07/13/2016  . Acute diastolic CHF (congestive heart failure) (HCC) 07/11/2016  . Cerebrovascular accident (CVA) (HCC)   . Essential hypertension   . HLD (hyperlipidemia)   . Anemia of chronic disease   . Physical deconditioning   . Acute renal failure superimposed on stage 3 chronic kidney disease (HCC)   . Diabetic ketoacidosis with coma associated with type 1 diabetes mellitus (HCC)   . History of ETT   . Stroke (HCC) 05/16/2016  . Cerebral thrombosis with cerebral infarction 05/15/2016  . Acute on chronic renal failure (HCC)   . Generalized anxiety disorder 05/10/2016  . Diabetic ketoacidosis without coma associated with type 1 diabetes mellitus (HCC) 05/06/2016  . Acute respiratory failure (HCC) 05/06/2016  . Acute respiratory failure with hypoxia (HCC) 05/06/2016  . Acute encephalopathy 05/06/2016  . Septic shock (HCC)   . Acute respiratory failure with hypoxia and hypercapnia (HCC)   . 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 Depression   . Hypertension   . CKD (chronic kidney disease) stage 3, GFR 30-59 ml/min   . Hypothyroidism   . Vitamin B12 deficiency   . Benign paroxysmal positional vertigo   . Anxiety   . Allergic rhinitis   . Glaucoma   .  Benign hypertension with CKD (chronic kidney disease) stage III   . Tobacco abuse   . Cervicalgia   . Elevated liver enzymes   . History of alcohol use     Past Surgical History:  Procedure Laterality Date  . ABDOMINAL HYSTERECTOMY     with oophorectomy  . APPENDECTOMY    . BLADDER SURGERY    . CATARACT EXTRACTION    . CERVICAL DISC SURGERY    . CHOLECYSTECTOMY    . HERNIA REPAIR    . TONSILLECTOMY      OB History    No data available       Home Medications    Prior to Admission medications   Medication Sig Start Date End Date Taking? Authorizing  Provider  amLODipine (NORVASC) 5 MG tablet Take 5 mg by mouth daily.   Yes Historical Provider, MD  aspirin 81 MG chewable tablet Chew 1 tablet (81 mg total) by mouth daily. 09/24/16  Yes Vassie Loll, MD  atorvastatin (LIPITOR) 10 MG tablet Take 1 tablet (10 mg total) by mouth daily at 6 PM. 05/19/16  Yes Rolly Salter, MD  carvedilol (COREG) 3.125 MG tablet Take 1 tablet (3.125 mg total) by mouth 2 (two) times daily with a meal. 07/18/16  Yes Mauricio Annett Gula, MD  clopidogrel (PLAVIX) 75 MG tablet Take 75 mg by mouth daily.   Yes Historical Provider, MD  escitalopram (LEXAPRO) 20 MG tablet Take 20 mg by mouth daily.   Yes Historical Provider, MD  HUMALOG 100 UNIT/ML injection Inject 0.05 mLs (5 Units total) into the skin 3 (three) times daily with meals. 09/23/16  Yes Vassie Loll, MD  hydrochlorothiazide (HYDRODIURIL) 25 MG tablet Take 0.5 tablets (12.5 mg total) by mouth daily. 09/28/16  Yes Mancel Bale, MD  insulin glargine (LANTUS) 100 UNIT/ML injection Inject 0.08 mLs (8 Units total) into the skin daily. 09/24/16  Yes Vassie Loll, MD  levothyroxine (SYNTHROID, LEVOTHROID) 88 MCG tablet Take 88 mcg by mouth daily before breakfast.   Yes Historical Provider, MD  losartan (COZAAR) 25 MG tablet Take 1 tablet (25 mg total) by mouth daily. 09/23/16  Yes Vassie Loll, MD  meclizine (ANTIVERT) 25 MG tablet Take 1 tablet (25 mg total) by mouth 3 (three) times daily as needed for dizziness. 02/12/16  Yes Chilton Si, MD  pantoprazole (PROTONIX) 40 MG tablet Take 40 mg by mouth daily.   Yes Historical Provider, MD  iron polysaccharides (NIFEREX) 150 MG capsule Take 1 capsule (150 mg total) by mouth 2 (two) times daily. Patient taking differently: Take 150 mg by mouth daily.  06/28/16   Vassie Loll, MD  LORazepam (ATIVAN) 0.5 MG tablet Take 1 tablet (0.5 mg total) by mouth every 8 (eight) hours as needed for anxiety. 05/19/16   Rolly Salter, MD  oxyCODONE-acetaminophen (PERCOCET/ROXICET)  5-325 MG tablet Take 2 tablets by mouth every 4 (four) hours as needed for severe pain. 09/18/16   Maia Plan, MD  polyethylene glycol [facility] / GLYCOLAX) packet Take 17 g by mouth every other day. Patient taking differently: Take 17 g by mouth daily as needed for mild constipation.  05/19/16   Rolly Salter, MD  senna-docusate (SENOKOT-S) 8.6-50 MG tablet Take 1 tablet by mouth daily. Patient taking differently: Take 1 tablet by mouth at bedtime as needed for mild constipation.  09/18/16   Maia Plan, MD    Family History Family History  Problem Relation Age of Onset  . Alcohol abuse Mother   .  Arthritis Mother   . Asthma Mother   . Cancer Mother     colon cancer  . Hypertension Mother   . Migraines Mother   . Stroke Mother   . Lung disease Mother   . COPD Mother   . Diabetes Father   . Hypertension Father   . Heart disease Father   . Heart attack Father   . Heart disease Paternal Grandmother   . Diabetes Paternal Grandmother   . Stroke Paternal Grandmother   . Cancer Paternal Grandmother   . Diabetes Paternal Grandfather     Social History Social History  Substance Use Topics  . Smoking status: Current Every Day Smoker    Packs/day: 0.25    Types: Cigarettes  . Smokeless tobacco: Never Used  . Alcohol use No     Comment: Hasn't had any alcohol since last admission     Allergies   Ciprofloxacin; Hydrocodone; Codeine; Doxycycline; Omnicef [cefdinir]; and Augmentin [amoxicillin-pot clavulanate]   Review of Systems Review of Systems  Unable to perform ROS: Mental status change     Physical Exam Updated Vital Signs BP (!) 108/37 (BP Location: Left Arm)   Pulse 87   Temp 97.9 F (36.6 C) (Oral)   Resp 18   Ht 5\' 2"  (1.575 m)   Wt 54.4 kg   SpO2 100%   BMI 21.95 kg/m   Physical Exam  Constitutional: She appears well-developed and well-nourished. No distress.  HENT:  Head: Normocephalic and atraumatic.  Mouth/Throat: Oropharynx is clear and moist.  No oropharyngeal exudate.  Eyes: Conjunctivae are normal. Pupils are equal, round, and reactive to light. Right eye exhibits no discharge. Left eye exhibits no discharge. No scleral icterus.  Neck: Normal range of motion. Neck supple. No thyromegaly present.  Cardiovascular: Normal rate, regular rhythm, normal heart sounds and intact distal pulses.  Exam reveals no gallop and no friction rub.   No murmur heard. Pulmonary/Chest: Effort normal and breath sounds normal. No stridor. No respiratory distress. She has no wheezes. She has no rales.  Abdominal: Soft. Bowel sounds are normal. She exhibits no distension. There is no tenderness. There is no rebound and no guarding.  Musculoskeletal: She exhibits no edema.  Lymphadenopathy:    She has no cervical adenopathy.  Neurological: She is alert. Coordination normal.  Skin: Skin is warm and dry. No rash noted. She is not diaphoretic. No pallor.  Psychiatric: She has a normal mood and affect.  Nursing note and vitals reviewed.    ED Treatments / Results  Labs (all labs ordered are listed, but only abnormal results are displayed) Labs Reviewed  CBC - Abnormal; Notable for the following:       Result Value   WBC 13.4 (*)    RBC 2.88 (*)    Hemoglobin 8.8 (*)    HCT 28.3 (*)    All other components within normal limits  URINALYSIS, ROUTINE W REFLEX MICROSCOPIC (NOT AT [facility]) - Abnormal; Notable for the following:    APPearance CLOUDY (*)    Glucose, UA >1000 (*)    Hgb urine dipstick SMALL (*)    Protein, ur >300 (*)    All other components within normal limits  BLOOD GAS, VENOUS - Abnormal; Notable for the following:    pH, Ven 6.979 (*)    pO2, Ven 48.7 (*)    All other components within normal limits  BASIC METABOLIC PANEL - Abnormal; Notable for the following:    Potassium 5.4 (*)  CO2 <7 (*)    Glucose, Bld 769 (*)    BUN 33 (*)    Creatinine, Ser 2.44 (*)    Calcium 7.5 (*)    GFR calc non Af Amer 19 (*)    GFR calc Af Amer  22 (*)    All other components within normal limits  BASIC METABOLIC PANEL - Abnormal; Notable for the following:    CO2 <7 (*)    Glucose, Bld 534 (*)    BUN 34 (*)    Creatinine, Ser 2.57 (*)    Calcium 7.4 (*)    GFR calc non Af Amer 18 (*)    GFR calc Af Amer 21 (*)    All other components within normal limits  URINE MICROSCOPIC-ADD ON - Abnormal; Notable for the following:    Squamous Epithelial / LPF 0-5 (*)    Bacteria, UA FEW (*)    All other components within normal limits  GLUCOSE, CAPILLARY - Abnormal; Notable for the following:    Glucose-Capillary 389 (*)    All other components within normal limits  GLUCOSE, CAPILLARY - Abnormal; Notable for the following:    Glucose-Capillary 336 (*)    All other components within normal limits  CBG MONITORING, ED - Abnormal; Notable for the following:    Glucose-Capillary >600 (*)    All other components within normal limits  CBG MONITORING, ED - Abnormal; Notable for the following:    Glucose-Capillary >600 (*)    All other components within normal limits  CBG MONITORING, ED - Abnormal; Notable for the following:    Glucose-Capillary >600 (*)    All other components within normal limits  CBG MONITORING, ED - Abnormal; Notable for the following:    Glucose-Capillary 552 (*)    All other components within normal limits  CBG MONITORING, ED - Abnormal; Notable for the following:    Glucose-Capillary 443 (*)    All other components within normal limits  MRSA PCR SCREENING  TROPONIN I  BASIC METABOLIC PANEL  BASIC METABOLIC PANEL  BASIC METABOLIC PANEL    EKG  EKG Interpretation  Date/Time:  Wednesday October 02 2016 12:35:18 EDT Ventricular Rate:  101 PR Interval:    QRS Duration: 89 QT Interval:  317 QTC Calculation: 411 R Axis:   78 Text Interpretation:  Sinus tachycardia Repol abnrm suggests ischemia, diffuse leads Confirmed by Particia Nearing MD, JULIE ([id number]) on 10/02/2016 1:43:53 PM Also confirmed by [facility] MD, JULIE  ([id number]), editor [place], Cala Bradford [phone])  on 10/02/2016 1:50:57 PM       Radiology Dg Chest Port 1 View  Result Date: 10/02/2016 CLINICAL DATA:  Altered mental status. Chest pain and shortness of breath. Congestive heart failure, chronic kidney disease, and asthma. EXAM: PORTABLE CHEST 1 VIEW COMPARISON:  09/28/2016 FINDINGS: The heart size and mediastinal contours are within normal limits. Both lungs are clear. No evidence of pulmonary infiltrate, edema, or pleural effusion. Cervical spine fusion hardware again noted. IMPRESSION: No active disease. Electronically Signed   By: Myles Rosenthal M.D.   On: 10/02/2016 14:07    Procedures Procedures (including critical care time)  CRITICAL CARE Performed by: Emi Holes   Total critical care time: 30 minutes  Critical care time was exclusive of separately billable procedures and treating other patients.  Critical care was necessary to treat or prevent imminent or life-threatening deterioration.  Critical care was time spent personally by me on the following activities: development of treatment plan with patient and/or surrogate as well  as nursing, discussions with consultants, evaluation of patient's response to treatment, examination of patient, obtaining history from patient or surrogate, ordering and performing treatments and interventions, ordering and review of laboratory studies, ordering and review of radiographic studies, pulse oximetry and re-evaluation of patient's condition.   Medications Ordered in ED Medications  0.9 %  sodium chloride infusion ( Intravenous New Bag/Given 10/02/16 2000)  dextrose 5 %-0.45 % sodium chloride infusion (not administered)  ondansetron (ZOFRAN) injection 4 mg (4 mg Intravenous Given 10/02/16 2032)  0.9 %  sodium chloride infusion (not administered)  0.9 %  sodium chloride infusion (not administered)  dextrose 5 %-0.45 % sodium chloride infusion (not administered)  insulin regular (NOVOLIN  R,HUMULIN R) 250 Units in sodium chloride 0.9 % 250 mL (1 Units/mL) infusion (16.6 Units/hr Intravenous Rate/Dose Change 10/02/16 2047)  enoxaparin (LOVENOX) injection 30 mg (not administered)  amLODipine (NORVASC) tablet 5 mg (not administered)  aspirin chewable tablet 81 mg (not administered)  atorvastatin (LIPITOR) tablet 10 mg (not administered)  carvedilol (COREG) tablet 3.125 mg (not administered)  clopidogrel (PLAVIX) tablet 75 mg (not administered)  escitalopram (LEXAPRO) tablet 20 mg (not administered)  levothyroxine (SYNTHROID, LEVOTHROID) tablet 88 mcg (not administered)  LORazepam (ATIVAN) tablet 0.5 mg (not administered)  oxyCODONE-acetaminophen (PERCOCET/ROXICET) 5-325 MG per tablet 1 tablet (not administered)  pantoprazole (PROTONIX) EC tablet 40 mg (not administered)  polyethylene glycol (MIRALAX / GLYCOLAX) packet 17 g (not administered)  iron polysaccharides (NIFEREX) capsule 150 mg (not administered)  sodium chloride 0.9 % bolus 1,000 mL (0 mLs Intravenous Stopped 10/02/16 1508)  sodium chloride 0.9 % bolus 1,000 mL (0 mLs Intravenous Stopped 10/02/16 1404)     Initial Impression / Assessment and Plan / ED Course  I have reviewed the triage vital signs and the nursing notes.  Pertinent labs & imaging results that were available during my care of the patient were reviewed by me and considered in my medical decision making (see chart for details).  Clinical Course   Patient with DKA and AKI. Uncontrolled, noncompliant type I diabetic. CBC shows a BBC 13.4, hemoglobin 8.8. ABG shows pH 6.979, CO2 below her portable range, O2 48.7. BMP shows potassium 5.4, CO2 7, glucose 769, BUN 33, creatinine 2.44, calcium 7.5. Troponin <0.03. UA shows glucose >1000, small hematuria, >300 protein, few bacteria. Normal saline and insulin drip initiated in the ED. I consulted Dr. Butler Denmark with [facility] who will admit the patient to step down for further evaluation and treatment.  Patient also evaluated by Dr. Particia Nearing who guided the patient's management and agrees with plan.    Final Clinical Impressions(s) / ED Diagnoses   Final diagnoses:  Diabetic ketoacidosis without coma associated with type 1 diabetes mellitus (HCC)  AKI (acute kidney injury) [facility])    New Prescriptions Current Discharge Medication List       Emi Holes, Cordelia Poche 10/02/16 2114    Jacalyn Lefevre, MD 10/03/16 [phone]

## 2016-10-02 NOTE — ED Notes (Signed)
Bed: [id number] Expected date:  Expected time:  Means of arrival:  Comments: EMS-hyperglycemic

## 2016-10-02 NOTE — ED Notes (Signed)
Dr. Butler Denmarkizwan is speaking with her oldest daughter Marshell Levan(Keeley) and an out-of-facility Child psychotherapistsocial worker who is here to do an evaluative test for pt. Long-term placement.

## 2016-10-02 NOTE — Progress Notes (Signed)
CRITICAL VALUE ALERT  Critical value received: glucose 534  Date of notification:  10/02/16  Time of notification:  1900  Critical value read back:Yes.    Nurse who received alert:  Effie BerkshireAlex Cosmo Tetreault  MD notified (1st page):  [name]  Time of first page:  1940  MD notified (2nd page):  Time of second page:  Responding MD:  Merdis DelayK Schorr  Time MD responded:  [phone]

## 2016-10-02 NOTE — ED Triage Notes (Signed)
Per EMS was seen here yesterday for the same symptoms-went home and did not take any meds, eat or drink anything-CBG 590-HR 100, BP 126/56-patient was altered and disoriented to date

## 2016-10-02 NOTE — H&P (Signed)
History and Physical    Cassandra [name] ZOX:[id number] DOB: 22-May-1946 DOA: 10/02/2016    PCP: Cassandra Copas, MD  Patient coming from: home  Chief Complaint: abdominal pain, diarrhea  HPI: Cassandra [name] is a 70 y.o. female with medical history significant of DM 1, CKD 3, HTN, hypothyoid who came into the ER yesterday for abdominal pain and diarrhea. Last admitted for DKA on 10-6 through 10/9. She was found to have a glucose of 500 which improved after treatment to 124. She was discharged home but did not take her medications and returns to the ER today. Of note, her husband asked her to be admitted yesterday as it would be easier for her to get into a SNF. Her daughter is interested today in getting her to an ALF or SNF from the hospital.  Today the patient is slow to respond to questions and has DKA. No diarrhea noted in the hospital. Does not admit to abdominal pain on my exam. Will not answer question about diarrhea. Daughter not aware if mother has been sick.   ED Course: glucose 769, ph 6.979, K 5.4, CO2 < 7, BUN 33, Cr 2.44, WBC count 13.4, Hb 8.8,  Review of Systems:  All other systems reviewed and apart from HPI, are negative.  Past Medical History:  Diagnosis Date  . Allergic rhinitis   . Anxiety   . Benign hypertension with CKD (chronic kidney disease) stage III   . Benign paroxysmal positional vertigo   . Bipolar disorder (HCC)   . Broken finger   . Broken shoulder   . Broken toes   . Cervicalgia   . CHF (congestive heart failure) (HCC) 07/2016  . CKD (chronic kidney disease) stage 3, GFR 30-59 ml/min   . Depression   . Diabetes mellitus without complication (HCC)   . Elevated liver enzymes Hep B/C neg 2014  . GERD (gastroesophageal reflux disease)   . Glaucoma   . History of alcohol use   . Hypertension   . Hypothyroidism   . Interstitial cystitis    bladder stretched every 9 months  . Migraines   . Psoriasis   . Stroke (HCC) 05/16/2016   Left  occipital and thalamic, right hippocampal  . Thyroid disease   . Tobacco use   . 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 Vitamin B12 deficiency     Past Surgical History:  Procedure Laterality Date  . ABDOMINAL HYSTERECTOMY     with oophorectomy  . APPENDECTOMY    . BLADDER SURGERY    . CATARACT EXTRACTION    . CERVICAL DISC SURGERY    . CHOLECYSTECTOMY    . HERNIA REPAIR    . TONSILLECTOMY      Social History:   reports that she has been smoking Cigarettes.  She has been smoking about 0.25 packs per day. She has never used smokeless tobacco. She reports that she does not drink alcohol or use drugs.  Allergies  Allergen Reactions  . Ciprofloxacin Hives  . Hydrocodone Nausea And Vomiting  . Codeine Diarrhea and Nausea And Vomiting  . Doxycycline Diarrhea and Nausea And Vomiting  . Omnicef [Cefdinir] Nausea Only and Other (See Comments)    Constipation, tolerated Zosyn  . Augmentin [Amoxicillin-Pot Clavulanate] Hives and Rash    Has patient had a PCN reaction causing immediate rash, facial/tongue/throat swelling, SOB or lightheadedness with hypotension: Yes Has patient had a PCN reaction causing severe rash involving mucus membranes or skin necrosis: Yes  Did a PCN reaction that required hospitalization No Did PCN reaction occurring within the last 10 years: Yes If all of the above answers are "NO", then may proceed with Cephalosporin use.  Pt states she has taken penicillin since, and was ok with it...    Family History  Problem Relation Age of Onset  . Alcohol abuse Mother   . Arthritis Mother   . Asthma Mother   . Cancer Mother     colon cancer  . Hypertension Mother   . Migraines Mother   . Stroke Mother   . Lung disease Mother   . COPD Mother   . Diabetes Father   . Hypertension Father   . Heart disease Father   . Heart attack Father   . Heart disease Paternal Grandmother   . Diabetes Paternal Grandmother   . Stroke Paternal Grandmother    . Cancer Paternal Grandmother   . Diabetes Paternal Grandfather      Prior to Admission medications   Medication Sig Start Date End Date Taking? Authorizing Provider  amLODipine (NORVASC) 5 MG tablet Take 5 mg by mouth daily.   Yes Historical Provider, MD  atorvastatin (LIPITOR) 10 MG tablet Take 1 tablet (10 mg total) by mouth daily at 6 PM. 05/19/16  Yes Cassandra Salter, MD  carvedilol (COREG) 3.125 MG tablet Take 1 tablet (3.125 mg total) by mouth 2 (two) times daily with a meal. 07/18/16  Yes Cassandra Annett Gula, MD  clopidogrel (PLAVIX) 75 MG tablet Take 75 mg by mouth daily.   Yes Historical Provider, MD  escitalopram (LEXAPRO) 20 MG tablet Take 20 mg by mouth daily.   Yes Historical Provider, MD  HUMALOG 100 UNIT/ML injection Inject 0.05 mLs (5 Units total) into the skin 3 (three) times daily with meals. 09/23/16  Yes Cassandra Loll, MD  hydrochlorothiazide (HYDRODIURIL) 25 MG tablet Take 0.5 tablets (12.5 mg total) by mouth daily. 09/28/16  Yes Cassandra Bale, MD  insulin glargine (LANTUS) 100 UNIT/ML injection Inject 0.08 mLs (8 Units total) into the skin daily. 09/24/16  Yes Cassandra Loll, MD  levothyroxine (SYNTHROID, LEVOTHROID) 88 MCG tablet Take 88 mcg by mouth daily before breakfast.   Yes Historical Provider, MD  losartan (COZAAR) 25 MG tablet Take 1 tablet (25 mg total) by mouth daily. 09/23/16  Yes Cassandra Loll, MD  meclizine (ANTIVERT) 25 MG tablet Take 1 tablet (25 mg total) by mouth 3 (three) times daily as needed for dizziness. 02/12/16  Yes Cassandra Si, MD  pantoprazole (PROTONIX) 40 MG tablet Take 40 mg by mouth daily.   Yes Historical Provider, MD  aspirin 81 MG chewable tablet Chew 1 tablet (81 mg total) by mouth daily. 09/24/16   Cassandra Loll, MD  iron polysaccharides (NIFEREX) 150 MG capsule Take 1 capsule (150 mg total) by mouth 2 (two) times daily. Patient taking differently: Take 150 mg by mouth daily.  06/28/16   Cassandra Loll, MD  LORazepam (ATIVAN) 0.5 MG  tablet Take 1 tablet (0.5 mg total) by mouth every 8 (eight) hours as needed for anxiety. 05/19/16   Cassandra Salter, MD  oxyCODONE-acetaminophen (PERCOCET/ROXICET) 5-325 MG tablet Take 2 tablets by mouth every 4 (four) hours as needed for severe pain. 09/18/16   Cassandra Plan, MD  polyethylene glycol [facility] / GLYCOLAX) packet Take 17 g by mouth every other day. Patient taking differently: Take 17 g by mouth daily as needed for mild constipation.  05/19/16   Cassandra Salter, MD  senna-docusate (SENOKOT-S) 8.6-50  MG tablet Take 1 tablet by mouth daily. Patient taking differently: Take 1 tablet by mouth at bedtime as needed for mild constipation.  09/18/16   Cassandra Plan, MD    Physical Exam: Vitals:   10/02/16 1336 10/02/16 1452 10/02/16 1523 10/02/16 1621  BP:  (!) 110/54 (!) 119/53 (!) 112/50  Pulse:  99 99 95  Resp:  21 16 18   Temp:      TempSrc:      SpO2:  100% 90% 100%  Weight: 54.4 kg (120 lb)     Height: 5\' 2"  (1.575 m)         Constitutional: NAD, calm, comfortable Eyes: PERTLA, lids and conjunctivae normal ENMT: Mucous membranes are moist. Posterior pharynx clear of any exudate or lesions. Normal dentition.  Neck: normal, supple, no masses, no thyromegaly Respiratory: clear to auscultation bilaterally, no wheezing, no crackles. Normal respiratory effort. No accessory muscle use.  Cardiovascular: S1 & S2 heard, regular rate and rhythm, no murmurs / rubs / gallops. No extremity edema. 2+ pedal pulses. No carotid bruits.  Abdomen: No distension, no tenderness, no masses palpated. No hepatosplenomegaly. Bowel sounds normal.  Musculoskeletal: no clubbing / cyanosis. No joint deformity upper and lower extremities. Good ROM, no contractures. Normal muscle tone.  Skin: no rashes, lesions, ulcers. No induration Neurologic: CN 2-12 grossly intact. Sensation intact, DTR normal. Strength 5/5 in all 4 limbs.  Psychiatric: Normal judgment and insight. Alert and oriented x 3. Normal mood.      Labs on Admission: I have personally reviewed following labs and imaging studies  CBC:  Recent Labs Lab 09/26/16 1652 09/28/16 1720 10/01/16 1454 10/02/16 1306  WBC 8.4 6.5 9.1 13.4*  NEUTROABS  --  4.1  --   --   HGB 9.5* 9.2* 9.1* 8.8*  HCT 27.0* 27.2* 25.9* 28.3*  MCV 90.5 90.7 87.5 98.3  PLT 219 260 332 359   Basic Metabolic Panel:  Recent Labs Lab 09/26/16 1652 09/28/16 1720 10/01/16 1454 10/02/16 1446  NA 137 136 136 135  K 3.9 4.3 3.6 5.4*  CL 105 106 104 104  CO2 24 21* 19* <7*  GLUCOSE 157* 347* 142* 769*  BUN 18 20 25* 33*  CREATININE 1.24* 1.28* 1.76* 2.44*  CALCIUM 8.1* 8.0* 8.5* 7.5*   GFR: Estimated Creatinine Clearance: 17.2 mL/min (by C-G formula based on SCr of 2.44 mg/dL (H)). Liver Function Tests:  Recent Labs Lab 09/26/16 1652 10/01/16 1454  AST 24 25  ALT 17 15  ALKPHOS 68 67  BILITOT 0.6 0.9  PROT 4.9* 5.4*  ALBUMIN 2.2* 2.5*    Recent Labs Lab 10/01/16 1454  LIPASE 42   No results for input(s): AMMONIA in the last 168 hours. Coagulation Profile: No results for input(s): INR, PROTIME in the last 168 hours. Cardiac Enzymes:  Recent Labs Lab 09/26/16 1652 09/28/16 1720 10/02/16 1446  TROPONINI <0.03 <0.03 <0.03   BNP (last 3 results) No results for input(s): PROBNP in the last 8760 hours. HbA1C: No results for input(s): HGBA1C in the last 72 hours. CBG:  Recent Labs Lab 10/02/16 1226 10/02/16 1452 10/02/16 1613 10/02/16 1719  GLUCAP >600* >600* >600* 552*   Lipid Profile: No results for input(s): CHOL, HDL, LDLCALC, TRIG, CHOLHDL, LDLDIRECT in the last 72 hours. Thyroid Function Tests: No results for input(s): TSH, T4TOTAL, FREET4, T3FREE, THYROIDAB in the last 72 hours. Anemia Panel: No results for input(s): VITAMINB12, FOLATE, FERRITIN, TIBC, IRON, RETICCTPCT in the last 72 hours. Urine analysis:  Component Value Date/Time   COLORURINE YELLOW 10/01/2016 1430   APPEARANCEUR CLOUDY (A) 10/01/2016  1430   APPEARANCEUR Clear 06/15/2014 1508   LABSPEC 1.020 10/01/2016 1430   LABSPEC 1.010 06/15/2014 1508   PHURINE 5.5 10/01/2016 1430   GLUCOSEU 250 (A) 10/01/2016 1430   GLUCOSEU Negative 06/15/2014 1508   HGBUR TRACE (A) 10/01/2016 1430   BILIRUBINUR MODERATE (A) 10/01/2016 1430   BILIRUBINUR Negative 06/15/2014 1508   KETONESUR 15 (A) 10/01/2016 1430   PROTEINUR >300 (A) 10/01/2016 1430   UROBILINOGEN 0.2 09/14/2015 1617   NITRITE NEGATIVE 10/01/2016 1430   LEUKOCYTESUR NEGATIVE 10/01/2016 1430   LEUKOCYTESUR Negative 06/15/2014 1508   Sepsis Labs: @LABRCNTIP (procalcitonin:4,lacticidven:4) )No results found for this or any previous visit (from the past 240 hour(s)).   Radiological Exams on Admission: Dg Chest Port 1 View  Result Date: 10/02/2016 CLINICAL DATA:  Altered mental status. Chest pain and shortness of breath. Congestive heart failure, chronic kidney disease, and asthma. EXAM: PORTABLE CHEST 1 VIEW COMPARISON:  09/28/2016 FINDINGS: The heart size and mediastinal contours are within normal limits. Both lungs are clear. No evidence of pulmonary infiltrate, edema, or pleural effusion. Cervical spine fusion hardware again noted. IMPRESSION: No active disease. Electronically Signed   By: Myles RosenthalJohn  Stahl M.D.   On: 10/02/2016 14:07    EKG: Independently reviewed. Sinus tachycardia at 101 bpm  Assessment/[name] Principal Problem:   Diabetic ketoacidosis with coma associated with type 1 diabetes mellitus (HCC) -A1c 9/2 on 10/6 - per daughter, she does not take medications and husband does not help her - Radio producertarted Glucomander- repeat Bmet Q4 hrs - received 3 L NS- has had about 300 cc urine output- continuous fluids at 125 cc/hr  Active Problems:  Diarrhea?  - follow - abdomen non-tender  Anemia- Iron deficiency - Hb about the same as discharge Hb but if she is dehydrated at this time, her actually Hb may be lower - follow- - cont Niferex for now     Hypotension with h/o  Hypertension - hold HCTZ, Amlodipine and Coreg      AKI on CKD (chronic kidney disease) stage 3, GFR 30-59 ml/min - likely prerenal- hold losartan- follow    Hypothyroidism - Synthroid    Hyperkalemia  - hold Losartan - should improve with hydration- will recheck soon     Depression - Lexapro    DVT prophylaxis: Lovenox Code Status: full code  Family Communication:  daughter  Disposition [name]: admit to SDU  Consults called: none  Admission status: admit    [facility] - The University [facility] Mississippi Medical CenterRIZWAN,Andreas Sobolewski MD [facility] Pager: [email or web address] Password [email or web address] 7PM-7AM, please contact night-coverage   10/02/2016, 5:27 PM

## 2016-10-03 ENCOUNTER — Inpatient Hospital Stay (HOSPITAL_COMMUNITY): Payer: Medicare Other

## 2016-10-03 DIAGNOSIS — I10 Essential (primary) hypertension: Secondary | ICD-10-CM

## 2016-10-03 DIAGNOSIS — E039 Hypothyroidism, unspecified: Secondary | ICD-10-CM

## 2016-10-03 DIAGNOSIS — N179 Acute kidney failure, unspecified: Secondary | ICD-10-CM

## 2016-10-03 DIAGNOSIS — N183 Chronic kidney disease, stage 3 (moderate): Secondary | ICD-10-CM

## 2016-10-03 DIAGNOSIS — M7989 Other specified soft tissue disorders: Secondary | ICD-10-CM

## 2016-10-03 DIAGNOSIS — I959 Hypotension, unspecified: Secondary | ICD-10-CM

## 2016-10-03 DIAGNOSIS — F329 Major depressive disorder, single episode, unspecified: Secondary | ICD-10-CM

## 2016-10-03 LAB — BASIC METABOLIC PANEL
Anion gap: 12 (ref 5–15)
Anion gap: 8 (ref 5–15)
Anion gap: 8 (ref 5–15)
Anion gap: 6 (ref 5–15)
BUN: 31 mg/dL — AB (ref 6–20)
BUN: 30 mg/dL — AB (ref 6–20)
BUN: 30 mg/dL — AB (ref 6–20)
BUN: 27 mg/dL — AB (ref 6–20)
CALCIUM: 7.3 mg/dL — AB (ref 8.9–10.3)
CALCIUM: 7.2 mg/dL — AB (ref 8.9–10.3)
CHLORIDE: 110 mmol/L (ref 101–111)
CHLORIDE: 114 mmol/L — AB (ref 101–111)
CO2: 15 mmol/L — ABNORMAL LOW (ref 22–32)
CO2: 17 mmol/L — ABNORMAL LOW (ref 22–32)
CO2: 18 mmol/L — AB (ref 22–32)
CO2: 19 mmol/L — AB (ref 22–32)
CREATININE: 2.42 mg/dL — AB (ref 0.44–1.00)
CREATININE: 2.16 mg/dL — AB (ref 0.44–1.00)
Calcium: 6.9 mg/dL — ABNORMAL LOW (ref 8.9–10.3)
Calcium: 7 mg/dL — ABNORMAL LOW (ref 8.9–10.3)
Chloride: 114 mmol/L — ABNORMAL HIGH (ref 101–111)
Chloride: 114 mmol/L — ABNORMAL HIGH (ref 101–111)
Creatinine, Ser: 2.1 mg/dL — ABNORMAL HIGH (ref 0.44–1.00)
Creatinine, Ser: 2.04 mg/dL — ABNORMAL HIGH (ref 0.44–1.00)
GFR calc Af Amer: 22 mL/min — ABNORMAL LOW (ref >60)
GFR calc Af Amer: 26 mL/min — ABNORMAL LOW (ref >60)
GFR calc Af Amer: 27 mL/min — ABNORMAL LOW (ref >60)
GFR calc Af Amer: 27 mL/min — ABNORMAL LOW (ref >60)
GFR calc non Af Amer: 23 mL/min — ABNORMAL LOW (ref >60)
GFR calc non Af Amer: 24 mL/min — ABNORMAL LOW (ref >60)
GFR, EST NON AFRICAN AMERICAN: 19 mL/min — AB (ref >60)
GFR, EST NON AFRICAN AMERICAN: 22 mL/min — AB (ref >60)
GLUCOSE: 120 mg/dL — AB (ref 65–99)
GLUCOSE: 81 mg/dL (ref 65–99)
GLUCOSE: 103 mg/dL — AB (ref 65–99)
Glucose, Bld: 186 mg/dL — ABNORMAL HIGH (ref 65–99)
POTASSIUM: 3.8 mmol/L (ref 3.5–5.1)
POTASSIUM: 4.4 mmol/L (ref 3.5–5.1)
POTASSIUM: 4.2 mmol/L (ref 3.5–5.1)
Potassium: 3.4 mmol/L — ABNORMAL LOW (ref 3.5–5.1)
SODIUM: 141 mmol/L (ref 135–145)
SODIUM: 139 mmol/L (ref 135–145)
Sodium: 136 mmol/L (ref 135–145)
Sodium: 139 mmol/L (ref 135–145)

## 2016-10-03 LAB — GLUCOSE, CAPILLARY
GLUCOSE-CAPILLARY: 100 mg/dL — AB (ref 65–99)
GLUCOSE-CAPILLARY: 126 mg/dL — AB (ref 65–99)
GLUCOSE-CAPILLARY: 133 mg/dL — AB (ref 65–99)
GLUCOSE-CAPILLARY: 72 mg/dL (ref 65–99)
GLUCOSE-CAPILLARY: 84 mg/dL (ref 65–99)
GLUCOSE-CAPILLARY: 91 mg/dL (ref 65–99)
Glucose-Capillary: 110 mg/dL — ABNORMAL HIGH (ref 65–99)
Glucose-Capillary: 77 mg/dL (ref 65–99)
Glucose-Capillary: 82 mg/dL (ref 65–99)
Glucose-Capillary: 169 mg/dL — ABNORMAL HIGH (ref 65–99)
Glucose-Capillary: 235 mg/dL — ABNORMAL HIGH (ref 65–99)
Glucose-Capillary: 215 mg/dL — ABNORMAL HIGH (ref 65–99)
Glucose-Capillary: 172 mg/dL — ABNORMAL HIGH (ref 65–99)
Glucose-Capillary: 133 mg/dL — ABNORMAL HIGH (ref 65–99)
Glucose-Capillary: 65 mg/dL (ref 65–99)
Glucose-Capillary: 78 mg/dL (ref 65–99)
Glucose-Capillary: 55 mg/dL — ABNORMAL LOW (ref 65–99)

## 2016-10-03 MED ORDER — POTASSIUM CHLORIDE 10 MEQ/100ML IV SOLN
10.0000 meq | INTRAVENOUS | Status: AC
  Administered 2016-10-03 (×3): 10 meq via INTRAVENOUS
  Filled 2016-10-03 (×3): qty 100

## 2016-10-03 MED ORDER — DEXTROSE 5 % IV SOLN
INTRAVENOUS | Status: DC
  Administered 2016-10-03: 07:00:00 via INTRAVENOUS

## 2016-10-03 MED ORDER — INSULIN ASPART 100 UNIT/ML ~~LOC~~ SOLN
0.0000 [IU] | Freq: Three times a day (TID) | SUBCUTANEOUS | Status: DC
  Administered 2016-10-03 – 2016-10-04 (×2): 3 [IU] via SUBCUTANEOUS

## 2016-10-03 MED ORDER — INSULIN GLARGINE 100 UNIT/ML ~~LOC~~ SOLN
10.0000 [IU] | Freq: Every day | SUBCUTANEOUS | Status: DC
  Administered 2016-10-03: 10 [IU] via SUBCUTANEOUS
  Filled 2016-10-03 (×2): qty 0.1

## 2016-10-03 MED ORDER — PROMETHAZINE HCL 25 MG/ML IJ SOLN
12.5000 mg | Freq: Once | INTRAMUSCULAR | Status: AC
  Administered 2016-10-03: 12.5 mg via INTRAVENOUS
  Filled 2016-10-03: qty 1

## 2016-10-03 MED ORDER — ONDANSETRON HCL 4 MG/2ML IJ SOLN
4.0000 mg | Freq: Three times a day (TID) | INTRAMUSCULAR | Status: DC | PRN
  Administered 2016-10-05 – 2016-10-07 (×2): 4 mg via INTRAVENOUS
  Filled 2016-10-03 (×2): qty 2

## 2016-10-03 MED ORDER — INSULIN ASPART 100 UNIT/ML ~~LOC~~ SOLN: 0.0000 [IU] | Freq: Every day | SUBCUTANEOUS | Status: DC

## 2016-10-03 MED ORDER — SODIUM CHLORIDE 0.9 % IV SOLN
INTRAVENOUS | Status: DC
  Administered 2016-10-03 (×2): via INTRAVENOUS

## 2016-10-03 NOTE — Evaluation (Signed)
Physical Therapy Evaluation Patient Details Name: Cassandra [name] MRN: [id number] DOB: 08/16/1946 Today's Date: 10/03/2016   History of Present Illness  70 y.o. female admitted 09/20/16 with dizziness, vomiting. Dx of DKA, and sepsis. 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  Clinical Impression  Pt admitted as above and presenting with functional mobility limitations 2* generalized weakness, balance deficits, fatigue and questionable cognition.  Pt would benefit from follow up rehab at SNF level to maximize IND and safety.    Follow Up Recommendations SNF    Equipment Recommendations  None recommended by PT    Recommendations for Other Services OT consult     Precautions / Restrictions Precautions Precautions: Fall Restrictions Weight Bearing Restrictions: No      Mobility  Bed Mobility Overal bed mobility: Needs Assistance;+2 for physical assistance Bed Mobility: Supine to Sit;Sit to Supine     Supine to sit: Mod assist;+2 for physical assistance;+2 for safety/equipment Sit to supine: Min assist;Mod assist;+2 for physical assistance;+2 for safety/equipment   General bed mobility comments: assist to manage LE and to control trunk.  Pad utilized to bring pt to EOB  Transfers                 General transfer comment: Pt refuses to attempt 2* fatigue  Ambulation/Gait                Stairs            Wheelchair Mobility    Modified Rankin (Stroke Patients Only)       Balance Overall balance assessment: Needs assistance Sitting-balance support: Feet supported;No upper extremity supported Sitting balance-Leahy Scale: Fair                                       Pertinent Vitals/Pain Pain Assessment: No/denies pain    Home Living Family/patient expects to be discharged to:: Private residence Living Arrangements:  Spouse/significant other Available Help at Discharge: Family;Available 24 hours/day Type of Home: Apartment Home Access: Level entry     Home Layout: One level Home Equipment: Walker - 2 wheels;Shower seat Additional Comments: Husband does all cooking, cleaning and laundry as well as grocery shopping.  (Information from previous hospitalization)    Prior Function Level of Independence: Needs assistance   Gait / Transfers Assistance Needed: walks with RW in house  ADL's / Homemaking Assistance Needed: Husband        Hand Dominance   Dominant Hand: Right    Extremity/Trunk Assessment   Upper Extremity Assessment: Generalized weakness           Lower Extremity Assessment: Generalized weakness      Cervical / Trunk Assessment: Kyphotic  Communication   Communication: Other (comment) (One word yes, no answers including to non yes/no questions)  Cognition Arousal/Alertness: Awake/alert Behavior During Therapy: Flat affect Overall Cognitive Status: No family/caregiver present to determine baseline cognitive functioning                      General Comments      Exercises     Assessment/Plan    PT Assessment Patient needs continued PT services  PT Problem List Decreased balance;Decreased activity tolerance;Decreased strength;Decreased mobility;Decreased knowledge of use of DME;Decreased cognition          PT Treatment Interventions DME instruction;Gait training;Functional mobility  training;Therapeutic activities;Therapeutic exercise;Balance training;Patient/family education;Cognitive remediation    PT Goals (Current goals can be found in the Care Plan section)  Acute Rehab PT Goals Patient Stated Goal: back to bed PT Goal Formulation: With patient Time For Goal Achievement: 10/15/16 Potential to Achieve Goals: Fair    Frequency Min 3X/week   Barriers to discharge        Co-evaluation               End of Session   Activity Tolerance:  Patient limited by fatigue Patient left: in bed;with call bell/phone within reach;with bed alarm set Nurse Communication: Mobility status         Time: [id number] PT Time Calculation (min) (ACUTE ONLY): 17 min   Charges:   PT Evaluation $PT Eval Moderate Complexity: 1 Procedure     PT G Codes:        Deran Barro 10/03/2016, 1:33 PM

## 2016-10-03 NOTE — Progress Notes (Signed)
Inpatient Diabetes Program Recommendations  AACE/ADA: New Consensus Statement on Inpatient Glycemic Control (2015)  Target Ranges:  Prepandial:   less than 140 mg/dL      Peak postprandial:   less than 180 mg/dL (1-2 hours)      Critically ill patients:  140 - 180 mg/dL   Results for [facility] A (MRN [id number]) as of 10/03/2016 07:57  Ref. Range 10/02/2016 14:46  Sodium Latest Ref Range: 135 - 145 mmol/L 135  Potassium Latest Ref Range: 3.5 - 5.1 mmol/L 5.4 (H)  Chloride Latest Ref Range: 101 - 111 mmol/L 104  CO2 Latest Ref Range: 22 - 32 mmol/L <7 (L)  BUN Latest Ref Range: 6 - 20 mg/dL 33 (H)  Creatinine Latest Ref Range: 0.44 - 1.00 mg/dL 2.44 (H)  Calcium Latest Ref Range: 8.9 - 10.3 mg/dL 7.5 (L)  EGFR (Non-African Amer.) Latest Ref Range: >60 mL/min 19 (L)  EGFR (African American) Latest Ref Range: >60 mL/min 22 (L)  Glucose Latest Ref Range: 65 - 99 mg/dL 769 (HH)  Anion gap Latest Ref Range: 5 - 15  NOT CALCULATED    Admit with: DKA  History: Type 1 DM, CHF, CKD  Home DM Meds: Lantus 8 units daily       Humalog 5 units TID  Current Insulin Orders: IV Insulin drip      -Note 5am BMET shows Anion gap down to 8 but CO2 still on the low side at 17.  -Patient well known to the Inpatient Diabetes Program.  This is pt's 8th admission since May of this year.  Note daughter and husband seeking long-term placement for patient.    MD- Once patient's CO2 reaches 20 and she is ready to transition to SQ insulin, please make sure pt receives her home dose of Lantus at least 1 hour before IV Insulin drip is stopped.     --Will follow patient during hospitalization--  Wyn Quaker RN, MSN, CDE Diabetes Coordinator Inpatient Glycemic Control Team Team Pager: [phone] (8a-5p)

## 2016-10-03 NOTE — Progress Notes (Signed)
Hypoglycemic Event  CBG: 65  Treatment: 15 GM carbohydrate snack  Symptoms: Pale  Follow-up CBG: Time:1616 CBG Result:78  Possible Reasons for Event: Inadequate meal intake  Comments/MD notified:    Cassandra [name] D

## 2016-10-03 NOTE — Progress Notes (Addendum)
VASCULAR LAB PRELIMINARY  PRELIMINARY  PRELIMINARY  PRELIMINARY  Left upper extremity venous duplex completed.    Preliminary report:  Left :  No obvious evidence of DVT or superficial thrombosis.  Unable to evaluate the subclavian bilaterally due to clavicle and vocal interference.  Ellenora Talton, RVS 10/03/2016, 10:18 AM

## 2016-10-03 NOTE — Progress Notes (Signed)
[facility] PROGRESS NOTE  Merril AbbeBillie A Stettner GNF:[id number] DOB: February 07, 1946 DOA: 10/02/2016  PCP: Irving CopasHACKER,ROBERT KELLER, MD  Brief History/Interval Summary: 70 year old Caucasian female with a past medical history of DM 1, CKD 3, HTN, hypothyoid who came into the ER yesterday for abdominal pain and diarrhea. Last admitted for DKA on 10-6 through 10/9. She was found to have a glucose of 500 which improved after treatment to 124. She was discharged home but did not take her medications and returned to the ER. Patient was found to be in DKA and was hospitalized for further management.  Reason for Visit: Diabetic ketoacidosis  Consultants: None  Procedures: None  Antibiotics: None  Subjective/Interval History: Patient mildly distracted. She is unable to tell me why she did not take her medications. Does not answer much.  ROS: Denies any chest pain or shortness of breath. According to the nursing staff, she had an episode of emesis earlier today.  Objective:  Vital Signs  Vitals:   10/03/16 0500 10/03/16 0600 10/03/16 0700 10/03/16 0800  BP: (!) 153/50 (!) 157/58 (!) 164/60 (!) 164/59  Pulse: 89 90 90 89  Resp: 13 12 18 11   Temp:    99.9 F (37.7 C)  TempSrc:    Oral  SpO2: 94% 96% 96% 96%  Weight:      Height:        Intake/Output Summary (Last 24 hours) at 10/03/16 1118 Last data filed at 10/03/16 0941  Gross per 24 hour  Intake          3725.08 ml  Output              250 ml  Net          3475.08 ml   Filed Weights   10/02/16 1250 10/02/16 1336  Weight: 54.4 kg (120 lb) 54.4 kg (120 lb)    General appearance: alert, cooperative, distracted and no distress Resp: clear to auscultation bilaterally Cardio: regular rate and rhythm, S1, S2 normal, no murmur, click, rub or gallop GI: soft, non-tender; bowel sounds normal; no masses,  no organomegaly Extremities: Edema noted bilateral upper extremities. Left more than right. Neurologic: Awake, alert.  Distracted. Cranial nerves II-12 intact. Generalized weakness appreciated. No obvious focal neuro deficits noted.  Lab Results:  Data Reviewed: I have personally reviewed following labs and imaging studies  CBC:  Recent Labs Lab 09/26/16 1652 09/28/16 1720 10/01/16 1454 10/02/16 1306  WBC 8.4 6.5 9.1 13.4*  NEUTROABS  --  4.1  --   --   HGB 9.5* 9.2* 9.1* 8.8*  HCT 27.0* 27.2* 25.9* 28.3*  MCV 90.5 90.7 87.5 98.3  PLT 219 260 332 359    Basic Metabolic Panel:  Recent Labs Lab 10/02/16 1833 10/02/16 2121 10/03/16 0126 10/03/16 0507 10/03/16 0912  NA 137 141 141 139 136  K 4.1 4.8 3.4* 3.8 4.4  CL 108 115* 114* 114* 110  CO2 <7* 7* 15* 17* 18*  GLUCOSE 534* 264* 120* 81 186*  BUN 34* 31* 31* 30* 30*  CREATININE 2.57* 2.58* 2.42* 2.16* 2.10*  CALCIUM 7.4* 7.2* 7.3* 7.2* 6.9*    GFR: Estimated Creatinine Clearance: 20 mL/min (by C-G formula based on SCr of 2.1 mg/dL (H)).  Liver Function Tests:  Recent Labs Lab 09/26/16 1652 10/01/16 1454  AST 24 25  ALT 17 15  ALKPHOS 68 67  BILITOT 0.6 0.9  PROT 4.9* 5.4*  ALBUMIN 2.2* 2.5*     Recent Labs Lab 10/01/16 1454  LIPASE  42   Cardiac Enzymes:  Recent Labs Lab 09/26/16 1652 09/28/16 1720 10/02/16 1446  TROPONINI <0.03 <0.03 <0.03    CBG:  Recent Labs Lab 10/03/16 0647 10/03/16 0752 10/03/16 0859 10/03/16 1005 10/03/16 1114  GLUCAP 100* 126* 169* 235* 215*     Recent Results (from the past 240 hour(s))  MRSA PCR Screening     Status: None   Collection Time: 10/02/16  7:01 PM  Result Value Ref Range Status   MRSA by PCR NEGATIVE NEGATIVE Final    Comment:        The GeneXpert MRSA Assay (FDA approved for NASAL specimens only), is one component of a comprehensive MRSA colonization surveillance program. It is not intended to diagnose MRSA infection nor to guide or monitor treatment for MRSA infections.       Radiology Studies: Dg Chest Port 1 View  Result Date:  10/02/2016 CLINICAL DATA:  Altered mental status. Chest pain and shortness of breath. Congestive heart failure, chronic kidney disease, and asthma. EXAM: PORTABLE CHEST 1 VIEW COMPARISON:  09/28/2016 FINDINGS: The heart size and mediastinal contours are within normal limits. Both lungs are clear. No evidence of pulmonary infiltrate, edema, or pleural effusion. Cervical spine fusion hardware again noted. IMPRESSION: No active disease. Electronically Signed   By: Myles Rosenthal M.D.   On: 10/02/2016 14:07     Medications:  Scheduled: . amLODipine  5 mg Oral Daily  . aspirin  81 mg Oral Daily  . atorvastatin  10 mg Oral q1800  . carvedilol  3.125 mg Oral BID WC  . chlorhexidine  15 mL Mouth Rinse BID  . clopidogrel  75 mg Oral Daily  . enoxaparin (LOVENOX) injection  30 mg Subcutaneous Q24H  . escitalopram  20 mg Oral Daily  . insulin aspart  0-15 Units Subcutaneous TID WC  . insulin aspart  0-5 Units Subcutaneous QHS  . insulin glargine  10 Units Subcutaneous Daily  . iron polysaccharides  150 mg Oral BID  . levothyroxine  88 mcg Oral QAC breakfast  . mouth rinse  15 mL Mouth Rinse q12n4p  . pantoprazole  40 mg Oral Daily   Continuous: . sodium chloride    . dextrose 75 mL/hr at 10/03/16 0654  . insulin (NOVOLIN-R) infusion 3.1 Units/hr (10/03/16 1114)   ZOX:WRUEAVWUJ, oxyCODONE-acetaminophen, polyethylene glycol  Assessment/Plan:  Principal Problem:   Diabetic ketoacidosis with coma associated with type 1 diabetes mellitus (HCC) Active Problems:   Depression   Hypertension   CKD (chronic kidney disease) stage 3, GFR 30-59 ml/min   Hypothyroidism   Acute encephalopathy   Acute on chronic renal failure (HCC)   Hyperkalemia    Diabetic ketoacidosis associated with type 1 diabetes mellitus A1c 9/2 on 10/6. Per daughter, she does not take medications and husband does not help her. Patient was started on insulin infusion. She was given IV fluids. Patient's anion gap has closed.  Her bicarbonate was 18 this morning. Her abdomen is soft. Transition to subcutaneous insulin. Reason for her DKA is most likely noncompliance. No source of infection is identified.  Questionable Diarrhea None noted in the hospital so far. Abdomen is benign. She did have Emesis earlier this morning. Continue to monitor.  Left proximal extremity swelling Doppler studies did not show any DVT. Most likely due to fluids given to her during her multiple hospitalizations recently.  Anemia- Iron deficiency Hemoglobin is stable. Continue Niferex for now.  Hypotension with h/o Hypertension Patient was hypotensive in the ED. Her antihypertensive agents  were held. Blood pressure is now in the hypertensive range. Antihypertensives resumed.  AKI on CKD (chronic kidney disease) stage 3, GFR 30-59 ml/min Baseline creatinine appears to be around 1.3. She does have acute renal failure and has been started on fluids. Renal function is slowly improving. Monitor urine output. Holding losartan.  Hypothyroidism Continue Synthroid.  Hyperkalemia Potassium is now normal. Continue to monitor.   Depression Continue Lexapro.  DVT Prophylaxis: Lovenox    Code Status: Full code  Family Communication: Discussed with the patient. No family at bedside  Disposition Plan: Transition to subcutaneous insulin. Family requesting skilled nursing facility placement. This might be reasonable to consider at this time, especially in view of patient's multiple hospitalizations and noncompliance. PT evaluation.     LOS: 1 day   [facility]  [facility] Pager [phone] 10/03/2016, 11:18 AM  If 7PM-7AM, please contact night-coverage at [email or web address], password [facility]

## 2016-10-03 NOTE — Care Management Note (Signed)
Case Management Note  Patient Details  Name: Cassandra [name] MRN: [id number] Date of Birth: 01-06-1946  Subjective/Objective:   Sepsis and dka with iv insulin and iv abx                 Action/Plan: from home   Expected Discharge Date:   (unknown)               Expected Discharge Plan:  Home/Self Care  In-House Referral:     Discharge planning Services     Post Acute Care Choice:    Choice offered to:     DME Arranged:    DME Agency:     HH Arranged:    HH Agency:     Status of Service:  In process, will continue to follow  If discussed at Long Length of Stay Meetings, dates discussed:    Additional Comments:  Golda AcreDavis, Nakiea Metzner Lynn, RN 10/03/2016, 10:58 AM

## 2016-10-04 ENCOUNTER — Inpatient Hospital Stay (HOSPITAL_COMMUNITY): Payer: Medicare Other

## 2016-10-04 DIAGNOSIS — E1022 Type 1 diabetes mellitus with diabetic chronic kidney disease: Secondary | ICD-10-CM

## 2016-10-04 DIAGNOSIS — E034 Atrophy of thyroid (acquired): Secondary | ICD-10-CM

## 2016-10-04 LAB — CBC
HCT: 23.3 % — ABNORMAL LOW (ref 36.0–46.0)
HEMOGLOBIN: 7.9 g/dL — AB (ref 12.0–15.0)
MCH: 30.7 pg (ref 26.0–34.0)
MCHC: 33.9 g/dL (ref 30.0–36.0)
MCV: 90.7 fL (ref 78.0–100.0)
PLATELETS: 249 10*3/uL (ref 150–400)
RBC: 2.57 MIL/uL — AB (ref 3.87–5.11)
RDW: 14.5 % (ref 11.5–15.5)
WBC: 10.2 10*3/uL (ref 4.0–10.5)

## 2016-10-04 LAB — BASIC METABOLIC PANEL
ANION GAP: 6 (ref 5–15)
BUN: 24 mg/dL — AB (ref 6–20)
CHLORIDE: 113 mmol/L — AB (ref 101–111)
CO2: 19 mmol/L — ABNORMAL LOW (ref 22–32)
Calcium: 6.9 mg/dL — ABNORMAL LOW (ref 8.9–10.3)
Creatinine, Ser: 1.76 mg/dL — ABNORMAL HIGH (ref 0.44–1.00)
GFR calc Af Amer: 33 mL/min — ABNORMAL LOW (ref >60)
GFR, EST NON AFRICAN AMERICAN: 28 mL/min — AB (ref >60)
GLUCOSE: 105 mg/dL — AB (ref 65–99)
POTASSIUM: 4 mmol/L (ref 3.5–5.1)
Sodium: 138 mmol/L (ref 135–145)

## 2016-10-04 LAB — GLUCOSE, CAPILLARY
GLUCOSE-CAPILLARY: 158 mg/dL — AB (ref 65–99)
GLUCOSE-CAPILLARY: 115 mg/dL — AB (ref 65–99)
Glucose-Capillary: 88 mg/dL (ref 65–99)
Glucose-Capillary: 101 mg/dL — ABNORMAL HIGH (ref 65–99)
Glucose-Capillary: 32 mg/dL — CL (ref 65–99)
Glucose-Capillary: 85 mg/dL (ref 65–99)

## 2016-10-04 MED ORDER — PANTOPRAZOLE SODIUM 40 MG PO TBEC
40.0000 mg | DELAYED_RELEASE_TABLET | Freq: Two times a day (BID) | ORAL | Status: DC
  Administered 2016-10-04 – 2016-10-09 (×10): 40 mg via ORAL
  Filled 2016-10-04 (×10): qty 1

## 2016-10-04 MED ORDER — DEXTROSE 5 % AND 0.45 % NACL IV BOLUS
250.0000 mL | Freq: Once | INTRAVENOUS | Status: AC
  Administered 2016-10-04: 250 mL via INTRAVENOUS

## 2016-10-04 MED ORDER — DEXTROSE 50 % IV SOLN
INTRAVENOUS | Status: AC
  Administered 2016-10-04 – 2016-10-05 (×2): 25 mL
  Filled 2016-10-04: qty 50

## 2016-10-04 MED ORDER — INSULIN ASPART 100 UNIT/ML ~~LOC~~ SOLN
0.0000 [IU] | Freq: Three times a day (TID) | SUBCUTANEOUS | Status: DC
Start: 2016-10-04 — End: 2016-10-09
  Administered 2016-10-05: 2 [IU] via SUBCUTANEOUS
  Administered 2016-10-06: 1 [IU] via SUBCUTANEOUS
  Administered 2016-10-06: 3 [IU] via SUBCUTANEOUS
  Administered 2016-10-07: 2 [IU] via SUBCUTANEOUS
  Administered 2016-10-07: 1 [IU] via SUBCUTANEOUS
  Administered 2016-10-08 (×2): 2 [IU] via SUBCUTANEOUS
  Administered 2016-10-09: 1 [IU] via SUBCUTANEOUS
  Administered 2016-10-09: 2 [IU] via SUBCUTANEOUS

## 2016-10-04 MED ORDER — INSULIN GLARGINE 100 UNIT/ML ~~LOC~~ SOLN
8.0000 [IU] | Freq: Every day | SUBCUTANEOUS | Status: DC
  Administered 2016-10-04: 8 [IU] via SUBCUTANEOUS
  Filled 2016-10-04 (×2): qty 0.08

## 2016-10-04 NOTE — Progress Notes (Signed)
CSW assisting with d/c planning. [facility] has offered [facility] placement bed for this weekend if pt is stable for d/c. Pt's daughter Joni Reiningicole [phone] has been updated and in agreement with plan. Weekend CSW [phone] will continue to follow to assist with d/c planning to SNF.  Cori RazorJamie Megon Kalina LCSW [phone]

## 2016-10-04 NOTE — Progress Notes (Signed)
[facility] PROGRESS NOTE  Cassandra [name] [id number] DOB: [birth year] DOA: 10/02/2016  PCP: Irving CopasHACKER,ROBERT KELLER, MD  Brief History/Interval Summary: 70 year old Caucasian female with a past medical history of DM 1, CKD 3, HTN, hypothyoid who came into the ER yesterday for abdominal pain and diarrhea. Last admitted for DKA on 10-6 through 10/9. She was found to have a glucose of 500 which improved after treatment to 124. She was discharged home but did not take her medications and returned to the ER. Patient was found to be in DKA and was hospitalized for further management.  Reason for Visit: Diabetic ketoacidosis  Consultants: None  Procedures: None  Antibiotics: None  Subjective/Interval History: Patient reports poor appetite, ongoing for many weeks. Denies any abdominal pain. No diarrhea. She knows that she has to eat meals in order to avoid low blood sugars.   ROS: Denies any chest pain or shortness of breath.   Objective:  Vital Signs  Vitals:   10/03/16 1800 10/03/16 2000 10/03/16 2105 10/04/16 0521  BP: (!) 146/55  137/62 (!) 146/53  Pulse: 88  83 89  Resp: 16  17 17   Temp:  (!) 100.4 F (38 C) 99 F (37.2 C) 98.7 F (37.1 C)  TempSrc:  Oral Oral Oral  SpO2: 98%  98% 96%  Weight:   56.9 kg (125 lb 7.1 oz)   Height:   5\' 2"  (1.575 m)     Intake/Output Summary (Last 24 hours) at 10/04/16 0740 Last data filed at 10/04/16 0522  Gross per 24 hour  Intake          2250.46 ml  Output              550 ml  Net          1700.46 ml   Filed Weights   10/02/16 1250 10/02/16 1336 10/03/16 2105  Weight: 54.4 kg (120 lb) 54.4 kg (120 lb) 56.9 kg (125 lb 7.1 oz)    General appearance: alert, cooperative, distracted and no distress Resp: clear to auscultation bilaterally Cardio: regular rate and rhythm, S1, S2 normal, no murmur, click, rub or gallop GI: soft, non-tender; bowel sounds normal; no masses,  no organomegaly Extremities: Swelling in her arms  and legs have improved. Neurologic: Awake, alert. Distracted. Cranial nerves II-12 intact. Generalized weakness appreciated. No obvious focal neuro deficits noted.  Lab Results:  Data Reviewed: I have personally reviewed following labs and imaging studies  CBC:  Recent Labs Lab 09/28/16 1720 10/01/16 1454 10/02/16 1306 10/04/16 0435  WBC 6.5 9.1 13.4* 10.2  NEUTROABS 4.1  --   --   --   HGB 9.2* 9.1* 8.8* 7.9*  HCT 27.2* 25.9* 28.3* 23.3*  MCV 90.7 87.5 98.3 90.7  PLT 260 332 359 249    Basic Metabolic Panel:  Recent Labs Lab 10/03/16 0126 10/03/16 0507 10/03/16 0912 10/03/16 1738 10/04/16 0435  NA 141 139 136 139 138  K 3.4* 3.8 4.4 4.2 4.0  CL 114* 114* 110 114* 113*  CO2 15* 17* 18* 19* 19*  GLUCOSE 120* 81 186* 103* 105*  BUN 31* 30* 30* 27* 24*  CREATININE 2.42* 2.16* 2.10* 2.04* 1.76*  CALCIUM 7.3* 7.2* 6.9* 7.0* 6.9*    GFR: Estimated Creatinine Clearance: 23.9 mL/min (by C-G formula based on SCr of 1.76 mg/dL (H)).  Liver Function Tests:  Recent Labs Lab 10/01/16 1454  AST 25  ALT 15  ALKPHOS 67  BILITOT 0.9  PROT 5.4*  ALBUMIN 2.5*  Recent Labs Lab 10/01/16 1454  LIPASE 42   Cardiac Enzymes:  Recent Labs Lab 09/28/16 1720 10/02/16 1446  TROPONINI <0.03 <0.03    CBG:  Recent Labs Lab 10/03/16 1259 10/03/16 1534 10/03/16 1616 10/03/16 2154 10/03/16 2229  GLUCAP 133* 65 78 55* 72     Recent Results (from the past 240 hour(s))  MRSA PCR Screening     Status: None   Collection Time: 10/02/16  7:01 PM  Result Value Ref Range Status   MRSA by PCR NEGATIVE NEGATIVE Final    Comment:        The GeneXpert MRSA Assay (FDA approved for NASAL specimens only), is one component of a comprehensive MRSA colonization surveillance program. It is not intended to diagnose MRSA infection nor to guide or monitor treatment for MRSA infections.       Radiology Studies: Dg Chest Port 1 View  Result Date:  10/02/2016 CLINICAL DATA:  Altered mental status. Chest pain and shortness of breath. Congestive heart failure, chronic kidney disease, and asthma. EXAM: PORTABLE CHEST 1 VIEW COMPARISON:  09/28/2016 FINDINGS: The heart size and mediastinal contours are within normal limits. Both lungs are clear. No evidence of pulmonary infiltrate, edema, or pleural effusion. Cervical spine fusion hardware again noted. IMPRESSION: No active disease. Electronically Signed   By: Myles Rosenthal M.D.   On: 10/02/2016 14:07     Medications:  Scheduled: . amLODipine  5 mg Oral Daily  . aspirin  81 mg Oral Daily  . atorvastatin  10 mg Oral q1800  . carvedilol  3.125 mg Oral BID WC  . clopidogrel  75 mg Oral Daily  . enoxaparin (LOVENOX) injection  30 mg Subcutaneous Q24H  . escitalopram  20 mg Oral Daily  . insulin aspart  0-15 Units Subcutaneous TID WC  . insulin aspart  0-5 Units Subcutaneous QHS  . insulin glargine  8 Units Subcutaneous Daily  . iron polysaccharides  150 mg Oral BID  . levothyroxine  88 mcg Oral QAC breakfast  . pantoprazole  40 mg Oral Daily   Continuous:   WUJ:WJXBJYNWG, ondansetron (ZOFRAN) IV, oxyCODONE-acetaminophen, polyethylene glycol  Assessment/Plan:  Principal Problem:   Diabetic ketoacidosis with coma associated with type 1 diabetes mellitus (HCC) Active Problems:   Depression   Hypertension   CKD (chronic kidney disease) stage 3, GFR 30-59 ml/min   Hypothyroidism   Acute encephalopathy   Acute on chronic renal failure (HCC)   Hyperkalemia   Arterial hypotension    Diabetic ketoacidosis associated with type 1 diabetes mellitus A1c 9.2 on 10/6. Per daughter, she does not take medications and husband does not help her. Patient was started on insulin infusion. She was given IV fluids. Patient's DKA has resolved. She was transitioned to subcutaneous insulin yesterday.  Diabetes mellitus type 1. She did have 2 episodes of hypoglycemia. This is secondary to poor oral  intake. Patient has been encouraged to eat her meals. It back on her Lantus. She did mention some difficulty swallowing and so barium swallow was performed. No obvious strictures noted. Increase PPI to twice a day. Stomach was noted to be collapsed. Unlikely she has gastroparesis. Continue to monitor for now.  Questionable Diarrhea None noted in the hospital so far. Abdomen is benign. Continue to monitor.  Left proximal extremity swelling Doppler studies did not show any DVT. Most likely due to fluids given to her during her multiple hospitalizations recently. Stop IV fluids.  Anemia- Iron deficiency Abdomen noted to be slightly lower today compared  to yesterday. No evidence for bleeding. Drop in hemoglobin is likely dilutional. Continue to monitor for now.  Hypotension with h/o Hypertension Patient was hypotensive in the ED. Her antihypertensive agents were held. Blood pressure is now in the hypertensive range. Antihypertensives resumed.  AKI on CKD (chronic kidney disease) stage 3, GFR 30-59 ml/min Baseline creatinine appears to be around 1.3. She does have acute renal failure and was started on fluids. Renal function has improved and close to baseline. Okay to stop IV fluids. Continue to hold losartan.  Hypothyroidism Continue Synthroid.  Hyperkalemia Potassium is now normal. Continue to monitor.   Depression Continue Lexapro.  DVT Prophylaxis: Lovenox    Code Status: Full code  Family Communication: Discussed with the patient. No family at bedside  Disposition Plan: Continue management as outlined above. Await stabilization of her glucose levels. Seen by physical therapy. Anticipate discharge to SNF over the weekend.     LOS: 2 days   [facility]  [facility] Pager [phone] 10/04/2016, 7:40 AM  If 7PM-7AM, please contact night-coverage at [email or web address], password [facility]

## 2016-10-04 NOTE — Clinical Social Work Placement (Signed)
   CLINICAL SOCIAL WORK PLACEMENT  NOTE  Date:  10/04/2016  Patient Details  Name: Cassandra [name] MRN: [id number] Date of Birth: 06-Dec-1946  Clinical Social Work is seeking post-discharge placement for this patient at the Skilled  Nursing Facility level of care (*CSW will initial, date and re-position this form in  chart as items are completed):  Yes   Patient/family provided with [facility] Clinical Social Work Department's list of facilities offering this level of care within the geographic area requested by the patient (or if unable, by the patient's family).  Yes   Patient/family informed of their freedom to choose among providers that offer the needed level of care, that participate in Medicare, Medicaid or managed care program needed by the patient, have an available bed and are willing to accept the patient.  Yes   Patient/family informed of [facility]'s ownership interest [facility] and [facility], as well as of the fact that they are under no obligation to receive care at these facilities.  PASRR submitted to EDS on       PASRR number received on       Existing PASRR number confirmed on       FL2 transmitted to all facilities in geographic area requested by pt/family on 10/04/16     FL2 transmitted to all facilities within larger geographic area on       Patient informed that his/her managed care company has contracts with or will negotiate with certain facilities, including the following:            Patient/family informed of bed offers received.  Patient chooses bed at       Physician recommends and patient chooses bed at      Patient to be transferred to   on  .  Patient to be transferred to facility by       Patient family notified on   of transfer.  Name of family member notified:        PHYSICIAN       Additional Comment:    _______________________________________________ Vennie HomansHaidinger, Mariangel Ringley Lee, LCSW  [phone] 10/04/2016, 12:37 PM

## 2016-10-04 NOTE — Progress Notes (Signed)
Holding Insulin orders for completion of this shift due to hypoglycemic episode per MD Hijazi.

## 2016-10-04 NOTE — Progress Notes (Signed)
Inpatient Diabetes Program Recommendations  AACE/ADA: New Consensus Statement on Inpatient Glycemic Control (2015)  Target Ranges:  Prepandial:   less than 140 mg/dL      [facility] postprandial:   less than 180 mg/dL (1-2 hours)      Critically ill patients:  140 - 180 mg/dL   Results for [facility], Cassandra A (MRN [id number]) as of 10/04/2016 08:19  Ref. Range 10/03/2016 12:08 10/03/2016 12:59 10/03/2016 15:34 10/03/2016 16:16 10/03/2016 21:54 10/03/2016 22:29  Glucose-Capillary Latest Ref Range: 65 - 99 mg/dL 191172 (H) 478133 (H) 65 78 55 (L) 72    Home DM Meds: Lantus 8 units daily                             Humalog 5 units TID  Current Insulin Orders: Lantus 8 units daily      Novolog Moderate Correction Scale/ SSI (0-15 units) TID AC + HS        MD- Note patient with Hypoglycemia yesterday at 4pm and again at bedtime.    Note Lantus reduced to 8 units daily.  Please also consider reducing Novolog Correction Scale/ SSI to Sensitive scale (0-9 units) TID AC + HS      --Will follow patient during hospitalization--  Ambrose FinlandJeannine Johnston Mang Hazelrigg RN, MSN, CDE Diabetes Coordinator Inpatient Glycemic Control Team Team Pager: [phone] (8a-5p)

## 2016-10-04 NOTE — Clinical Social Work Note (Signed)
Clinical Social Work Assessment  Patient Details  Name: Cassandra [name] MRN: [id number] Date of Birth: [birth year]  Date of referral:  10/04/16               Reason for consult:  Discharge Planning                Permission sought to share information with:  Chartered certified accountant granted to share information::  Yes, Verbal Permission Granted  Name::        Agency::     Relationship::     Contact Information:     Housing/Transportation Living arrangements for the past 2 months:  Single Family Home Source of Information:  Patient Patient Interpreter Needed:  None Criminal Activity/Legal Involvement Pertinent to Current Situation/Hospitalization:  No - Comment as needed Significant Relationships:  Adult Children Lives with:  Spouse Do you feel safe going back to the place where you live?   (SNF recommended.) Need for family participation in patient care:  Yes (Comment)  Care giving concerns: Pt's care cannot be managed at home following hospital d/c.   Social Worker assessment / plan:  Pt hospitalized from home on 10/02/16 with Diabetic ketoacidosis. CSW met with pt to assist with d/c planning. PT has recommended SNF at d/c. Pt is in agreement with this plan and SNF search has been initiated. Pt has requested [facility] for rehab. SNF has been contacted and a decision is pending. CSW will provide bed offers this afternoon and continue assisting with d/c planning needs.  Employment status:  Retired Nurse, adult PT Recommendations:  [place] / Referral to community resources:  [place]  Patient/Family's Response to care:  Pt agrees with plan for [employer].  Patient/Family's Understanding of and Emotional Response to Diagnosis, Current Treatment, and Prognosis:  Pt is aware of her medical status. " I'm doing better. " Pt is motivated to work with therapy.   Emotional  Assessment Appearance:  Appears stated age Attitude/Demeanor/Rapport:  Other (cooperative) Affect (typically observed):  Appropriate, Calm Orientation:  Oriented to Self, Oriented to Place, Oriented to Situation Alcohol / Substance use:  Not Applicable Psych involvement (Current and /or in the community):  No (Comment)  Discharge Needs  Concerns to be addressed:  Discharge Planning Concerns Readmission within the last 30 days:  Yes Current discharge risk:  None Barriers to Discharge:  No Barriers Identified   Luretha Rued, [place] 10/04/2016, 12:25 PM

## 2016-10-04 NOTE — Evaluation (Signed)
Clinical/Bedside Swallow Evaluation Patient Details  Name: MAYSA LYNN MRN: [id number] Date of Birth: 12/07/1946  Today's Date: 10/04/2016 Time: SLP Start Time (ACUTE ONLY): 1347 SLP Stop Time (ACUTE ONLY): 1358 SLP Time Calculation (min) (ACUTE ONLY): 11 min  Past Medical History:  Past Medical History:  Diagnosis Date  . Allergic rhinitis   . Anxiety   . Benign hypertension with CKD (chronic kidney disease) stage III   . Benign paroxysmal positional vertigo   . Bipolar disorder (HCC)   . Broken finger   . Broken shoulder   . Broken toes   . Cervicalgia   . CHF (congestive heart failure) (HCC) 07/2016  . CKD (chronic kidney disease) stage 3, GFR 30-59 ml/min   . Depression   . Diabetes mellitus without complication (HCC)   . Elevated liver enzymes Hep B/C neg 2014  . GERD (gastroesophageal reflux disease)   . Glaucoma   . History of alcohol use   . Hypertension   . Hypothyroidism   . Interstitial cystitis    bladder stretched every 9 months  . Migraines   . Psoriasis   . Stroke (HCC) 05/16/2016   Left occipital and thalamic, right hippocampal  . Thyroid disease   . Tobacco use   . 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 Vitamin B12 deficiency    Past Surgical History:  Past Surgical History:  Procedure Laterality Date  . ABDOMINAL HYSTERECTOMY     with oophorectomy  . APPENDECTOMY    . BLADDER SURGERY    . CATARACT EXTRACTION    . CERVICAL DISC SURGERY    . CHOLECYSTECTOMY    . HERNIA REPAIR    . TONSILLECTOMY     HPI:  70 y.o.femalewith medical history significant of DM, stroke 05/16/16, cervicalgia, bipolar,  CKD 3, HTN, hypothyoid who came into the ER yesterday for abdominal pain and diarrhea. Per chart recent admission for  DKA on 10-6 through 10/9. CXR no active disease. Esophagram performed today 10/20 cricopharyngeus muscle dysfunction, characteristic of chronic gastroesophageal reflux disease, moderate esophageal dysmotility,  with a pattern characteristic of chronic reflux related dysmotility. Swallowed 13 mm barium tablet became lodged temporarily at the esophagogastric junction, cannot exclude a mild peptic stricture at this location. BSE 05/11/16 recommended regular/thin, cough with larger volumes.    Assessment / Plan / Recommendation Clinical Impression  Limited observation during swallow assessment as pt just completed lunch and politely declined solids. Reviewed esophagram performed this morning with following results: cricopharyngeus muscle dysfunction and moderate esophageal dysmotility. No s/s aspiration with consecutive straw sips however pt educated to take smaller sips and additional education for GERD- stay upright minimum 1 hour after meals, smaller meals, drink liquids during meals, pt sleeps on two pillows. She is at higher risk for post prandial penetration or aspiration Recommend continue regular consistency and thin liquids and reflux/esophageal precautions. No follow up ST needed at this time.      Aspiration Risk   (mild-mod)    Diet Recommendation Regular;Thin liquid   Liquid Administration via: Cup;Straw Medication Administration: Whole meds with liquid Supervision: Patient able to self feed Compensations: Follow solids with liquid Postural Changes: Seated upright at 90 degrees (upright for 1 hour after meals)    Other  Recommendations Oral Care Recommendations: Oral care BID   Follow up Recommendations None      Frequency and Duration            Prognosis  Swallow Study   General HPI: 70 y.o.femalewith medical history significant of DM, stroke 05/16/16, cervicalgia, bipolar,  CKD 3, HTN, hypothyoid who came into the ER yesterday for abdominal pain and diarrhea. Per chart recent admission for  DKA on 10-6 through 10/9. CXR no active disease. Esophagram performed today 10/20 cricopharyngeus muscle dysfunction, characteristic of chronic gastroesophageal reflux disease, moderate  esophageal dysmotility, with a pattern characteristic of chronic reflux related dysmotility. Swallowed 13 mm barium tablet became lodged temporarily at the esophagogastric junction, cannot exclude a mild peptic stricture at this location. BSE 05/11/16 recommended regular/thin, cough with larger volumes.  Type of Study: Bedside Swallow Evaluation Previous Swallow Assessment:  (see HPI) Diet Prior to this Study: Regular;Thin liquids Temperature Spikes Noted: No Respiratory Status: Room air History of Recent Intubation: No Behavior/Cognition: Alert;Cooperative;Pleasant mood Oral Cavity Assessment: Within Functional Limits Oral Care Completed by SLP: No Oral Cavity - Dentition: Adequate natural dentition Vision: Functional for self-feeding Self-Feeding Abilities: Able to feed self Patient Positioning: Upright in bed Baseline Vocal Quality: Normal Volitional Cough: Strong Volitional Swallow: Able to elicit    Oral/Motor/Sensory Function Overall Oral Motor/Sensory Function: Within functional limits   Ice Chips Ice chips: Not tested   Thin Liquid Thin Liquid: Within functional limits Presentation: Straw    Nectar Thick Nectar Thick Liquid: Not tested   Honey Thick Honey Thick Liquid: Not tested   Puree Puree: Within functional limits   Solid   GO   Solid:  (politely declined)        Royce MacadamiaLitaker, Jerico Grisso Willis 10/04/2016,2:16 PM   Breck CoonsLisa Willis Saint MarksLitaker M.Ed [employer] Pager [phone]

## 2016-10-04 NOTE — NC FL2 (Signed)
[place] MEDICAID FL2 LEVEL OF CARE SCREENING TOOL     IDENTIFICATION  Patient Name: Cassandra AbbeBillie A Sharpnack Birthdate: Sep 12, 1946 Sex: female Admission Date (Current Location): 10/02/2016  [place]   Facility and Address:  [facility],  [address], [place] [id number]      Provider Number: [id number]  Attending Physician Name and Address:  Osvaldo ShipperGokul Krishnan, MD  Relative Name and Phone Number:       Current Level of Care: Hospital Recommended Level of Care: Skilled Nursing Facility Prior Approval Number:    Date Approved/Denied:   PASRR Number: [id number]  Discharge Plan: SNF    Current Diagnoses: Patient Active Problem List   Diagnosis Date Noted  . Arterial hypotension   . Hyperkalemia   . Non-intractable vomiting   . Gastroesophageal reflux disease   . H/O: CVA (cerebrovascular accident)   . DKA, type 1 (HCC) 09/06/2016  . Hypoglycemia associated with diabetes (HCC)   . Orthostatic hypotension   . Protein-calorie malnutrition, severe 08/28/2016  . UTI (lower urinary tract infection) 07/19/2016  . AKI (acute kidney injury) (HCC) 07/19/2016  . Chronic diastolic heart failure (HCC) 07/19/2016  . Dehydration   . Cerebral embolism with cerebral infarction 07/13/2016  . Acute diastolic CHF (congestive heart failure) (HCC) 07/11/2016  . Cerebrovascular accident (CVA) (HCC)   . Essential hypertension   . HLD (hyperlipidemia)   . Anemia of chronic disease   . Physical deconditioning   . Acute renal failure superimposed on stage 3 chronic kidney disease (HCC)   . Diabetic ketoacidosis with coma associated with type 1 diabetes mellitus (HCC)   . History of ETT   . Stroke (HCC) 05/16/2016  . Cerebral thrombosis with cerebral infarction 05/15/2016  . Acute on chronic renal failure (HCC)   . Generalized anxiety disorder 05/10/2016  . Diabetic ketoacidosis without coma associated with type 1 diabetes mellitus (HCC) 05/06/2016   . Acute respiratory failure (HCC) 05/06/2016  . Acute respiratory failure with hypoxia (HCC) 05/06/2016  . Acute encephalopathy 05/06/2016  . Septic shock (HCC)   . Acute respiratory failure with hypoxia and hypercapnia (HCC)   . 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 Depression   . Hypertension   . CKD (chronic kidney disease) stage 3, GFR 30-59 ml/min   . Hypothyroidism   . Vitamin B12 deficiency   . Benign paroxysmal positional vertigo   . Anxiety   . Allergic rhinitis   . Glaucoma   . Benign hypertension with CKD (chronic kidney disease) stage III   . Tobacco abuse   . Cervicalgia   . Elevated liver enzymes   . History of alcohol use     Orientation RESPIRATION BLADDER Height & Weight     Self, Situation, Place  Normal Continent Weight: 125 lb 7.1 oz (56.9 kg) Height:  5\' 2"  (157.5 cm)  BEHAVIORAL SYMPTOMS/MOOD NEUROLOGICAL BOWEL NUTRITION STATUS  Other (Comment) (no behaviors)   Continent Diet  AMBULATORY STATUS COMMUNICATION OF NEEDS Skin   Extensive Assist Verbally Normal                       Personal Care Assistance Level of Assistance  Bathing, Feeding, Dressing Bathing Assistance: Maximum assistance Feeding assistance: Independent Dressing Assistance: Maximum assistance     Functional Limitations Info  Sight, Hearing, Speech Sight Info: Adequate Hearing Info: Adequate Speech Info: Adequate    SPECIAL CARE FACTORS FREQUENCY  PT (By licensed PT), OT (  By licensed OT)     PT Frequency: 5x wk OT Frequency: 5x wk            Contractures Contractures Info: Not present    Additional Factors Info  Code Status, Allergies, Psychotropic Code Status Info: Full Code             Current Medications (10/04/2016):  This is the current hospital active medication list Current Facility-Administered Medications  Medication Dose Route Frequency Provider Last Rate Last Dose  . amLODipine (NORVASC) tablet 5 mg  5 mg Oral Daily  Calvert Cantor, MD   5 mg at 10/03/16 1000  . aspirin chewable tablet 81 mg  81 mg Oral Daily Calvert Cantor, MD   81 mg at 10/03/16 1000  . atorvastatin (LIPITOR) tablet 10 mg  10 mg Oral q1800 Saima Rizwan, MD      . carvedilol (COREG) tablet 3.125 mg  3.125 mg Oral BID WC Calvert Cantor, MD   3.125 mg at 10/03/16 0800  . clopidogrel (PLAVIX) tablet 75 mg  75 mg Oral Daily Calvert Cantor, MD   75 mg at 10/03/16 1000  . enoxaparin (LOVENOX) injection 30 mg  30 mg Subcutaneous Q24H Calvert Cantor, MD   30 mg at 10/03/16 2253  . escitalopram (LEXAPRO) tablet 20 mg  20 mg Oral Daily Calvert Cantor, MD   20 mg at 10/03/16 1000  . insulin aspart (novoLOG) injection 0-15 Units  0-15 Units Subcutaneous TID WC Osvaldo Shipper, MD   3 Units at 10/04/16 0930  . insulin aspart (novoLOG) injection 0-5 Units  0-5 Units Subcutaneous QHS Osvaldo Shipper, MD      . insulin glargine (LANTUS) injection 8 Units  8 Units Subcutaneous Daily Osvaldo Shipper, MD      . iron polysaccharides (NIFEREX) capsule 150 mg  150 mg Oral BID Calvert Cantor, MD   150 mg at 10/03/16 2251  . levothyroxine (SYNTHROID, LEVOTHROID) tablet 88 mcg  88 mcg Oral QAC breakfast Calvert Cantor, MD   88 mcg at 10/03/16 0800  . LORazepam (ATIVAN) tablet 0.5 mg  0.5 mg Oral Q8H PRN Calvert Cantor, MD      . ondansetron (ZOFRAN) injection 4 mg  4 mg Intravenous Q8H PRN Osvaldo Shipper, MD      . oxyCODONE-acetaminophen (PERCOCET/ROXICET) 5-325 MG per tablet 1 tablet  1 tablet Oral Q4H PRN Calvert Cantor, MD   1 tablet at 10/04/16 0329  . pantoprazole (PROTONIX) EC tablet 40 mg  40 mg Oral Daily Calvert Cantor, MD   40 mg at 10/03/16 1000  . polyethylene glycol (MIRALAX / GLYCOLAX) packet 17 g  17 g Oral Daily PRN Calvert Cantor, MD         Discharge Medications: Please see discharge summary for a list of discharge medications.  Relevant Imaging Results:  Relevant Lab Results:   Additional Information SSN:  [id number]  Joselle Deeds, Dickey Gave, LCSW

## 2016-10-04 NOTE — Progress Notes (Signed)
Hypoglycemic Event  CBG: 55 at 2154pm  Treatment: 15 gm carbohydrate snack  Symptoms: Denies  Follow-up CBG: Time:2229 CBG Result:72  Possible Reasons for Event:brittle DM; patient did not eat much dinner.  Comments/MD notified:N/A protocol followed.   Kenton KingfisherMills, Celsa Nordahl [place]

## 2016-10-04 NOTE — Progress Notes (Signed)
Notified by NA that patient CBG at 2043 was 32.  Patient was given 50% Dextrose (25 grams -0.5g/ml) 25mls.  Patient CBG at 2104 was 101.  Repeat check at 2116 was 85.  Call placed to oncall provider via [email or web address].  MD Hijazi returned call and gave new orders.

## 2016-10-05 ENCOUNTER — Inpatient Hospital Stay (HOSPITAL_COMMUNITY): Payer: Medicare Other

## 2016-10-05 DIAGNOSIS — D649 Anemia, unspecified: Secondary | ICD-10-CM

## 2016-10-05 DIAGNOSIS — E10649 Type 1 diabetes mellitus with hypoglycemia without coma: Secondary | ICD-10-CM

## 2016-10-05 LAB — CBC
HCT: 23.5 % — ABNORMAL LOW (ref 36.0–46.0)
Hemoglobin: 7.8 g/dL — ABNORMAL LOW (ref 12.0–15.0)
MCH: 30.1 pg (ref 26.0–34.0)
MCHC: 33.2 g/dL (ref 30.0–36.0)
MCV: 90.7 fL (ref 78.0–100.0)
PLATELETS: 208 10*3/uL (ref 150–400)
RBC: 2.59 MIL/uL — ABNORMAL LOW (ref 3.87–5.11)
RDW: 14.5 % (ref 11.5–15.5)
WBC: 5 10*3/uL (ref 4.0–10.5)

## 2016-10-05 LAB — URINALYSIS, ROUTINE W REFLEX MICROSCOPIC
GLUCOSE, UA: 250 mg/dL — AB
Ketones, ur: NEGATIVE mg/dL
Nitrite: POSITIVE — AB
Protein, ur: >300 mg/dL — AB
SPECIFIC GRAVITY, URINE: 1.02 (ref 1.005–1.030)
pH: 6 (ref 5.0–8.0)

## 2016-10-05 LAB — BASIC METABOLIC PANEL
Anion gap: 4 — ABNORMAL LOW (ref 5–15)
BUN: 20 mg/dL (ref 6–20)
CALCIUM: 6.9 mg/dL — AB (ref 8.9–10.3)
CO2: 21 mmol/L — ABNORMAL LOW (ref 22–32)
CREATININE: 1.28 mg/dL — AB (ref 0.44–1.00)
Chloride: 113 mmol/L — ABNORMAL HIGH (ref 101–111)
GFR, EST AFRICAN AMERICAN: 48 mL/min — AB (ref >60)
GFR, EST NON AFRICAN AMERICAN: 42 mL/min — AB (ref >60)
Glucose, Bld: 57 mg/dL — ABNORMAL LOW (ref 65–99)
Potassium: 3.9 mmol/L (ref 3.5–5.1)
SODIUM: 138 mmol/L (ref 135–145)

## 2016-10-05 LAB — URINE MICROSCOPIC-ADD ON
RBC / HPF: NONE SEEN RBC/hpf (ref 0–5)
Squamous Epithelial / LPF: NONE SEEN

## 2016-10-05 LAB — GLUCOSE, CAPILLARY
GLUCOSE-CAPILLARY: 139 mg/dL — AB (ref 65–99)
GLUCOSE-CAPILLARY: 149 mg/dL — AB (ref 65–99)
GLUCOSE-CAPILLARY: 165 mg/dL — AB (ref 65–99)
GLUCOSE-CAPILLARY: 48 mg/dL — AB (ref 65–99)
GLUCOSE-CAPILLARY: 62 mg/dL — AB (ref 65–99)
Glucose-Capillary: 118 mg/dL — ABNORMAL HIGH (ref 65–99)
Glucose-Capillary: 61 mg/dL — ABNORMAL LOW (ref 65–99)
Glucose-Capillary: 53 mg/dL — ABNORMAL LOW (ref 65–99)
Glucose-Capillary: 63 mg/dL — ABNORMAL LOW (ref 65–99)
Glucose-Capillary: 132 mg/dL — ABNORMAL HIGH (ref 65–99)
Glucose-Capillary: 189 mg/dL — ABNORMAL HIGH (ref 65–99)
Glucose-Capillary: 245 mg/dL — ABNORMAL HIGH (ref 65–99)

## 2016-10-05 MED ORDER — MEGESTROL ACETATE 400 MG/10ML PO SUSP
200.0000 mg | Freq: Every day | ORAL | Status: DC
  Administered 2016-10-05: 200 mg via ORAL
  Filled 2016-10-05: qty 10

## 2016-10-05 MED ORDER — METOCLOPRAMIDE HCL 5 MG/ML IJ SOLN
5.0000 mg | Freq: Four times a day (QID) | INTRAMUSCULAR | Status: DC
  Administered 2016-10-05 – 2016-10-06 (×4): 5 mg via INTRAVENOUS
  Filled 2016-10-05 (×4): qty 2

## 2016-10-05 MED ORDER — METOCLOPRAMIDE HCL 5 MG/ML IJ SOLN: 5.0000 mg | Freq: Four times a day (QID) | INTRAMUSCULAR | Status: DC

## 2016-10-05 MED ORDER — DEXTROSE 50 % IV SOLN
50.0000 mL | INTRAVENOUS | Status: DC | PRN
  Administered 2016-10-05: 50 mL via INTRAVENOUS
  Filled 2016-10-05: qty 50

## 2016-10-05 MED ORDER — INSULIN GLARGINE 100 UNIT/ML ~~LOC~~ SOLN
5.0000 [IU] | Freq: Every day | SUBCUTANEOUS | Status: DC
  Administered 2016-10-06 – 2016-10-09 (×4): 5 [IU] via SUBCUTANEOUS
  Filled 2016-10-05 (×4): qty 0.05

## 2016-10-05 MED ORDER — ACETAMINOPHEN 650 MG RE SUPP
650.0000 mg | RECTAL | Status: DC | PRN
  Administered 2016-10-05 – 2016-10-06 (×2): 650 mg via RECTAL
  Filled 2016-10-05: qty 1

## 2016-10-05 NOTE — Progress Notes (Signed)
Patient is not medically stable, LCSWA updated patient daughter-Cassandra [name]. Attempted to update SNF admission coordinator unavailable.LCSWA left number with receptionist for [facility] to return call.  CSW will continue to follow for disposition.

## 2016-10-05 NOTE — Progress Notes (Signed)
[facility] PROGRESS NOTE  CENIYAH THORP ZOX:[id number] DOB: February 10, 1946 DOA: 10/02/2016  PCP: Irving Copas, MD  Brief History/Interval Summary: 70 year old Caucasian female with a past medical history of DM 1, CKD 3, HTN, hypothyoid who came into the ER yesterday for abdominal pain and diarrhea. Last admitted for DKA on 10-6 through 10/9. She was found to have a glucose of 500 which improved after treatment to 124. She was discharged home but did not take her medications and returned to the ER. Patient was found to be in DKA and was hospitalized for further management. Patient was transitioned off of her IV insulin once her DKA resolved. Over the last 2 days, hypoglycemia has been the main issue. This is most likely due to poor oral intake.  Reason for Visit: Hypoglycemia  Consultants: None  Procedures: None  Antibiotics: None  Subjective/Interval History: Patient continues to have poor appetite. Oral intake is quite poor. She appears to be somewhat distracted and confused also. Per nursing staff, she had an episode of vomiting as well.   ROS: Denies any chest pain or shortness of breath.   Objective:  Vital Signs  Vitals:   10/04/16 0521 10/04/16 1523 10/04/16 1937 10/05/16 0602  BP: (!) 146/53 (!) 105/57 (!) 131/55 (!) 150/80  Pulse: 89 70 74 76  Resp: 17 18 16 16   Temp: 98.7 F (37.1 C) 98.4 F (36.9 C) 98.9 F (37.2 C) 99.3 F (37.4 C)  TempSrc: Oral Oral Oral Oral  SpO2: 96% 96% 98% 97%  Weight:      Height:        Intake/Output Summary (Last 24 hours) at 10/05/16 0804 Last data filed at 10/05/16 0620  Gross per 24 hour  Intake              720 ml  Output             1000 ml  Net             -280 ml   Filed Weights   10/02/16 1250 10/02/16 1336 10/03/16 2105  Weight: 54.4 kg (120 lb) 54.4 kg (120 lb) 56.9 kg (125 lb 7.1 oz)    General appearance: alert, cooperative, distracted and no distress Resp: clear to auscultation  bilaterally Cardio: regular rate and rhythm, S1, S2 normal, no murmur, click, rub or gallop GI: soft, non-tender; bowel sounds normal; no masses,  no organomegaly Extremities: Swelling in her arms and legs have improved. Neurologic: Awake, alert. Distracted. No obvious focal neuro deficits noted.  Lab Results:  Data Reviewed: I have personally reviewed following labs and imaging studies  CBC:  Recent Labs Lab 09/28/16 1720 10/01/16 1454 10/02/16 1306 10/04/16 0435 10/05/16 0415  WBC 6.5 9.1 13.4* 10.2 5.0  NEUTROABS 4.1  --   --   --   --   HGB 9.2* 9.1* 8.8* 7.9* 7.8*  HCT 27.2* 25.9* 28.3* 23.3* 23.5*  MCV 90.7 87.5 98.3 90.7 90.7  PLT 260 332 359 249 208    Basic Metabolic Panel:  Recent Labs Lab 10/03/16 0507 10/03/16 0912 10/03/16 1738 10/04/16 0435 10/05/16 0415  NA 139 136 139 138 138  K 3.8 4.4 4.2 4.0 3.9  CL 114* 110 114* 113* 113*  CO2 17* 18* 19* 19* 21*  GLUCOSE 81 186* 103* 105* 57*  BUN 30* 30* 27* 24* 20  CREATININE 2.16* 2.10* 2.04* 1.76* 1.28*  CALCIUM 7.2* 6.9* 7.0* 6.9* 6.9*    GFR: Estimated Creatinine Clearance: 32.8 mL/min (by  C-G formula based on SCr of 1.28 mg/dL (H)).  Liver Function Tests:  Recent Labs Lab 10/01/16 1454  AST 25  ALT 15  ALKPHOS 67  BILITOT 0.9  PROT 5.4*  ALBUMIN 2.5*     Recent Labs Lab 10/01/16 1454  LIPASE 42   Cardiac Enzymes:  Recent Labs Lab 09/28/16 1720 10/02/16 1446  TROPONINI <0.03 <0.03    CBG:  Recent Labs Lab 10/05/16 0230 10/05/16 0321 10/05/16 0559 10/05/16 0659 10/05/16 0722  GLUCAP 62* 61* 53* 63* 165*     Recent Results (from the past 240 hour(s))  MRSA PCR Screening     Status: None   Collection Time: 10/02/16  7:01 PM  Result Value Ref Range Status   MRSA by PCR NEGATIVE NEGATIVE Final    Comment:        The GeneXpert MRSA Assay (FDA approved for NASAL specimens only), is one component of a comprehensive MRSA colonization surveillance program. It is  not intended to diagnose MRSA infection nor to guide or monitor treatment for MRSA infections.       Radiology Studies: Dg Esophagus  Result Date: 10/04/2016 CLINICAL DATA:  70 year old female inpatient with several months of dysphagia, worsened since recent stroke. EXAM: ESOPHOGRAM/BARIUM SWALLOW TECHNIQUE: Single contrast examination was performed using  thin barium. FLUOROSCOPY TIME:  Fluoroscopy Time:  3 minutes 36 seconds Radiation Exposure Index (if provided by the fluoroscopic device): 46.6 Number of Acquired Spot Images: 3 COMPARISON:  10/02/2016 chest radiograph. FINDINGS: Limited inpatient esophagram, significantly limited by patient mobility. The oral and pharyngeal phases of swallowing appear normal, with no laryngeal penetration or tracheobronchial aspiration. There is no significant barium retention in the pharynx. No mass, stricture or diverticulum is seen in the pharynx. There is cricopharyngeus muscle dysfunction, characterized by delayed and incomplete opening of the cricopharyngeus muscle. There is moderate esophageal dysmotility, characterized by intermittent moderate weakening of primary peristalsis in the mid to lower thoracic esophagus. No hiatal hernia. No gastroesophageal reflux was elicited on this limited scan. No gross esophageal mass or ulcer is detected. No gross esophageal stricture is visualized on these limited views. A swallowed 13 mm barium tablet became lodged at the esophagogastric junction for 1-2 minutes until additional barium was swallowed, with subsequent passage of the tablet into the stomach. The stomach is relatively collapsed. IMPRESSION: 1. Normal oral and pharyngeal phase of swallowing, with no laryngeal penetration or tracheobronchial aspiration. 2. Cricopharyngeus muscle dysfunction, characteristic of chronic gastroesophageal reflux disease. 3. Moderate esophageal dysmotility, with a pattern characteristic of chronic reflux related dysmotility. 4.  Gastroesophageal reflux was not directly elicited on this limited scan. No hiatal hernia. 5. Swallowed 13 mm barium tablet became lodged temporarily at the esophagogastric junction, cannot exclude a mild peptic stricture at this location. No gross esophageal mass. Consider correlation with upper endoscopy as clinically warranted. Electronically Signed   By: Delbert PhenixJason A Poff M.D.   On: 10/04/2016 11:19     Medications:  Scheduled: . amLODipine  5 mg Oral Daily  . aspirin  81 mg Oral Daily  . atorvastatin  10 mg Oral q1800  . carvedilol  3.125 mg Oral BID WC  . clopidogrel  75 mg Oral Daily  . enoxaparin (LOVENOX) injection  30 mg Subcutaneous Q24H  . escitalopram  20 mg Oral Daily  . insulin aspart  0-9 Units Subcutaneous TID WC  . [START ON 10/06/2016] insulin glargine  5 Units Subcutaneous Daily  . iron polysaccharides  150 mg Oral BID  . levothyroxine  88 mcg Oral QAC breakfast  . megestrol  200 mg Oral Daily  . pantoprazole  40 mg Oral BID AC   Continuous:   [id number], LORazepam, ondansetron (ZOFRAN) IV, oxyCODONE-acetaminophen, polyethylene glycol  Assessment/Plan:  Principal Problem:   Diabetic ketoacidosis with coma associated with type 1 diabetes mellitus (HCC) Active Problems:   Depression   Hypertension   CKD (chronic kidney disease) stage 3, GFR 30-59 ml/min   Hypothyroidism   Acute encephalopathy   Acute on chronic renal failure (HCC)   Hyperkalemia   Arterial hypotension     Diabetes mellitus type 1 with Hypoglycemia She continues to have episodes of hypoglycemia. This is likely secondary to poor oral intake. Dose of Lantus will be further reduced, and today's dose will be skipped. Reason for patient's poor appetite is not entirely clear. She had an unremarkable CT scan earlier this month. Barium swallow did not show any obvious strictures. Swallow evaluation was also unremarkable. Since she has had a few episodes of vomiting, diabetic gastroparesis is a  possibility. She will be given Reglan and will monitor for improvement in symptoms. Continue PPI. A1c 9.2 on 10/6. Per daughter, she does not take medications and husband does not help her.  Diabetic ketoacidosis associated with type 1 diabetes mellitus Resolved.  Patient was started on insulin infusion. She was given IV fluids. Patient's DKA has resolved. She was transitioned to subcutaneous insulin.  Questionable Diarrhea None noted in the hospital so far. Abdomen is benign. Continue to monitor.  Left proximal extremity swelling Doppler studies did not show any DVT. Most likely due to fluids given to her during her multiple hospitalizations recently. Stop IV fluids.  Anemia- Iron deficiency Hemoglobin is low but stable. No evidence for bleeding. Drop in hemoglobin is likely dilutional. Continue to monitor for now.  Hypotension with h/o Hypertension Patient was hypotensive in the ED. Her antihypertensive agents were held. Blood pressure is now in the hypertensive range. Antihypertensives resumed.  AKI on CKD (chronic kidney disease) stage 3, GFR 30-59 ml/min Baseline creatinine appears to be around 1.3. She did have acute renal failure and was started on fluids. Renal function has improved and close to baseline. Continue to hold losartan.  Hypothyroidism Continue Synthroid.  Hyperkalemia Potassium is now normal. Continue to monitor.   Depression Continue Lexapro.  DVT Prophylaxis: Lovenox    Code Status: Full code  Family Communication: Discussed with the patient. No family at bedside  Disposition Plan: Continue management as outlined above. Await stabilization of her glucose levels. Seen by physical therapy. Anticipate discharge to SNF when medically ready.    LOS: 3 days   [facility]  [facility] Pager [phone] 10/05/2016, 8:04 AM  If 7PM-7AM, please contact night-coverage at [email or web address], password [facility]

## 2016-10-05 NOTE — Progress Notes (Signed)
Patient vomiting after attempting to eat.  MD notified with concern about poor po intake and history of hypoglycemic episodes through the night.  Per MD no fluid orders at this time, Reglan order placed. Will continue to monitor.

## 2016-10-05 NOTE — Progress Notes (Signed)
Patient with temp of 103.3, not able to eat or drink today with episodes of vomiting after each attempt to eat.  MD notified and new orders obtained. Will continue to monitor.

## 2016-10-05 NOTE — Progress Notes (Signed)
Pt remains hypoglycemic with last CBG of 64.  Patient given Dextrose 50% again.  Awaiting recheck by CNA of CBG.

## 2016-10-06 DIAGNOSIS — N390 Urinary tract infection, site not specified: Secondary | ICD-10-CM

## 2016-10-06 LAB — GLUCOSE, CAPILLARY
GLUCOSE-CAPILLARY: 329 mg/dL — AB (ref 65–99)
GLUCOSE-CAPILLARY: 115 mg/dL — AB (ref 65–99)
GLUCOSE-CAPILLARY: 155 mg/dL — AB (ref 65–99)
Glucose-Capillary: 289 mg/dL — ABNORMAL HIGH (ref 65–99)
Glucose-Capillary: 230 mg/dL — ABNORMAL HIGH (ref 65–99)
Glucose-Capillary: 141 mg/dL — ABNORMAL HIGH (ref 65–99)
Glucose-Capillary: 104 mg/dL — ABNORMAL HIGH (ref 65–99)

## 2016-10-06 LAB — BASIC METABOLIC PANEL
ANION GAP: 8 (ref 5–15)
BUN: 24 mg/dL — AB (ref 6–20)
CHLORIDE: 112 mmol/L — AB (ref 101–111)
CO2: 18 mmol/L — ABNORMAL LOW (ref 22–32)
Calcium: 7 mg/dL — ABNORMAL LOW (ref 8.9–10.3)
Creatinine, Ser: 1.7 mg/dL — ABNORMAL HIGH (ref 0.44–1.00)
GFR, EST AFRICAN AMERICAN: 34 mL/min — AB (ref >60)
GFR, EST NON AFRICAN AMERICAN: 30 mL/min — AB (ref >60)
Glucose, Bld: 341 mg/dL — ABNORMAL HIGH (ref 65–99)
POTASSIUM: 4.3 mmol/L (ref 3.5–5.1)
SODIUM: 138 mmol/L (ref 135–145)

## 2016-10-06 LAB — BLOOD CULTURE ID PANEL (REFLEXED)
ACINETOBACTER BAUMANNII: NOT DETECTED
CANDIDA ALBICANS: NOT DETECTED
CANDIDA GLABRATA: NOT DETECTED
CANDIDA KRUSEI: NOT DETECTED
CANDIDA TROPICALIS: NOT DETECTED
Candida parapsilosis: NOT DETECTED
Carbapenem resistance: NOT DETECTED
ENTEROBACTER CLOACAE COMPLEX: NOT DETECTED
ESCHERICHIA COLI: NOT DETECTED
Enterobacteriaceae species: DETECTED — AB
Enterococcus species: NOT DETECTED
Haemophilus influenzae: NOT DETECTED
KLEBSIELLA OXYTOCA: NOT DETECTED
Klebsiella pneumoniae: NOT DETECTED
Listeria monocytogenes: NOT DETECTED
NEISSERIA MENINGITIDIS: NOT DETECTED
PROTEUS SPECIES: NOT DETECTED
Pseudomonas aeruginosa: NOT DETECTED
SERRATIA MARCESCENS: NOT DETECTED
STAPHYLOCOCCUS SPECIES: NOT DETECTED
STREPTOCOCCUS AGALACTIAE: NOT DETECTED
STREPTOCOCCUS PNEUMONIAE: NOT DETECTED
Staphylococcus aureus (BCID): NOT DETECTED
Streptococcus pyogenes: NOT DETECTED
Streptococcus species: NOT DETECTED

## 2016-10-06 LAB — CBC
HCT: 22.2 % — ABNORMAL LOW (ref 36.0–46.0)
Hemoglobin: 7.3 g/dL — ABNORMAL LOW (ref 12.0–15.0)
MCH: 30.5 pg (ref 26.0–34.0)
MCHC: 32.9 g/dL (ref 30.0–36.0)
MCV: 92.9 fL (ref 78.0–100.0)
PLATELETS: 96 10*3/uL — AB (ref 150–400)
RBC: 2.39 MIL/uL — ABNORMAL LOW (ref 3.87–5.11)
RDW: 14.7 % (ref 11.5–15.5)
WBC: 13.2 10*3/uL — ABNORMAL HIGH (ref 4.0–10.5)

## 2016-10-06 LAB — TSH: TSH: 89.408 u[IU]/mL — ABNORMAL HIGH (ref 0.350–4.500)

## 2016-10-06 MED ORDER — DEXTROSE 5 % IV SOLN
1.0000 g | INTRAVENOUS | Status: DC
  Administered 2016-10-06: 1 g via INTRAVENOUS
  Filled 2016-10-06 (×2): qty 10

## 2016-10-06 MED ORDER — DEXTROSE 5 % IV SOLN
1.0000 g | INTRAVENOUS | Status: DC
  Administered 2016-10-06 – 2016-10-08 (×3): 1 g via INTRAVENOUS
  Filled 2016-10-06 (×3): qty 1

## 2016-10-06 MED ORDER — LEVOTHYROXINE SODIUM 100 MCG IV SOLR
75.0000 ug | Freq: Every day | INTRAVENOUS | Status: DC
  Administered 2016-10-07 – 2016-10-08 (×2): 75 ug via INTRAVENOUS
  Filled 2016-10-06 (×4): qty 5

## 2016-10-06 MED ORDER — METOCLOPRAMIDE HCL 5 MG/ML IJ SOLN
10.0000 mg | Freq: Four times a day (QID) | INTRAMUSCULAR | Status: DC
  Administered 2016-10-06 – 2016-10-08 (×8): 10 mg via INTRAVENOUS
  Filled 2016-10-06 (×8): qty 2

## 2016-10-06 MED ORDER — INSULIN ASPART 100 UNIT/ML ~~LOC~~ SOLN
4.0000 [IU] | Freq: Once | SUBCUTANEOUS | Status: AC
  Administered 2016-10-06: 4 [IU] via SUBCUTANEOUS

## 2016-10-06 MED ORDER — SODIUM CHLORIDE 0.9 % IV SOLN
INTRAVENOUS | Status: AC
  Administered 2016-10-06: 09:00:00 via INTRAVENOUS

## 2016-10-06 NOTE — Progress Notes (Signed)
[facility] PROGRESS NOTE  Cassandra [name] ZOX:[id number] DOB: [birth year] DOA: 10/02/2016  PCP: Irving Copas, MD  Brief History/Interval Summary: 70 year old Caucasian female with a past medical history of DM 1, CKD 3, HTN, hypothyoid who came into the ER yesterday for abdominal pain and diarrhea. Last admitted for DKA on 10-6 through 10/9. She was found to have a glucose of 500 which improved after treatment to 124. She was discharged home but did not take her medications and returned to the ER. Patient was found to be in DKA and was hospitalized for further management. Patient was transitioned off of her IV insulin once her DKA resolved. Subsequently, patient developed hypoglycemia. And then on the evening of 10/21 she developed a fever.  Reason for Visit: Hypoglycemia. Fever  Consultants: None  Procedures: None  Antibiotics: None  Subjective/Interval History: Patient continues to me that she is feeling very cold. She states that she tolerated clear liquid this morning. Abdomen feels better. Remains distracted. Did not have good dietary intake yesterday.  ROS: Denies any chest pain or shortness of breath.   Objective:  Vital Signs  Vitals:   10/05/16 1827 10/05/16 1958 10/06/16 0444 10/06/16 0700  BP: (!) 158/60 (!) 126/51 (!) 142/57   Pulse: 98 82 86   Resp: 12 14 14    Temp: (!) 103.1 F (39.5 C) 100.3 F (37.9 C) 98.8 F (37.1 C) 99.6 F (37.6 C)  TempSrc: Oral Oral Oral Oral  SpO2: 95% 96% 99%   Weight:      Height:        Intake/Output Summary (Last 24 hours) at 10/06/16 0804 Last data filed at 10/05/16 1819  Gross per 24 hour  Intake              200 ml  Output                0 ml  Net              200 ml   Filed Weights   10/02/16 1250 10/02/16 1336 10/03/16 2105  Weight: 54.4 kg (120 lb) 54.4 kg (120 lb) 56.9 kg (125 lb 7.1 oz)    General appearance: alert, cooperative, distracted and no distress Resp: clear to auscultation  bilaterally Cardio: regular rate and rhythm, S1, S2 normal, no murmur, click, rub or gallop GI: Abdomen remains soft, non-tender; bowel sounds normal; no masses,  no organomegaly Extremities: Swelling in her arms and legs have improved. Neurologic: Awake, alert. Distracted. No obvious focal neuro deficits noted.  Lab Results:  Data Reviewed: I have personally reviewed following labs and imaging studies  CBC:  Recent Labs Lab 10/01/16 1454 10/02/16 1306 10/04/16 0435 10/05/16 0415 10/06/16 0409  WBC 9.1 13.4* 10.2 5.0 13.2*  HGB 9.1* 8.8* 7.9* 7.8* 7.3*  HCT 25.9* 28.3* 23.3* 23.5* 22.2*  MCV 87.5 98.3 90.7 90.7 92.9  PLT 332 359 249 208 96*    Basic Metabolic Panel:  Recent Labs Lab 10/03/16 0912 10/03/16 1738 10/04/16 0435 10/05/16 0415 10/06/16 0409  NA 136 139 138 138 138  K 4.4 4.2 4.0 3.9 4.3  CL 110 114* 113* 113* 112*  CO2 18* 19* 19* 21* 18*  GLUCOSE 186* 103* 105* 57* 341*  BUN 30* 27* 24* 20 24*  CREATININE 2.10* 2.04* 1.76* 1.28* 1.70*  CALCIUM 6.9* 7.0* 6.9* 6.9* 7.0*    GFR: Estimated Creatinine Clearance: 24.7 mL/min (by C-G formula based on SCr of 1.7 mg/dL (H)).  Liver Function Tests:  Recent Labs Lab 10/01/16 1454  AST 25  ALT 15  ALKPHOS 67  BILITOT 0.9  PROT 5.4*  ALBUMIN 2.5*     Recent Labs Lab 10/01/16 1454  LIPASE 42   Cardiac Enzymes:  Recent Labs Lab 10/02/16 1446  TROPONINI <0.03    CBG:  Recent Labs Lab 10/05/16 1956 10/05/16 2303 10/06/16 0203 10/06/16 0441 10/06/16 0744  GLUCAP 149* 245* 329* 289* 230*     Recent Results (from the past 240 hour(s))  MRSA PCR Screening     Status: None   Collection Time: 10/02/16  7:01 PM  Result Value Ref Range Status   MRSA by PCR NEGATIVE NEGATIVE Final    Comment:        The GeneXpert MRSA Assay (FDA approved for NASAL specimens only), is one component of a comprehensive MRSA colonization surveillance program. It is not intended to diagnose  MRSA infection nor to guide or monitor treatment for MRSA infections.       Radiology Studies: Dg Esophagus  Result Date: 10/04/2016 CLINICAL DATA:  70 year old female inpatient with several months of dysphagia, worsened since recent stroke. EXAM: ESOPHOGRAM/BARIUM SWALLOW TECHNIQUE: Single contrast examination was performed using  thin barium. FLUOROSCOPY TIME:  Fluoroscopy Time:  3 minutes 36 seconds Radiation Exposure Index (if provided by the fluoroscopic device): 46.6 Number of Acquired Spot Images: 3 COMPARISON:  10/02/2016 chest radiograph. FINDINGS: Limited inpatient esophagram, significantly limited by patient mobility. The oral and pharyngeal phases of swallowing appear normal, with no laryngeal penetration or tracheobronchial aspiration. There is no significant barium retention in the pharynx. No mass, stricture or diverticulum is seen in the pharynx. There is cricopharyngeus muscle dysfunction, characterized by delayed and incomplete opening of the cricopharyngeus muscle. There is moderate esophageal dysmotility, characterized by intermittent moderate weakening of primary peristalsis in the mid to lower thoracic esophagus. No hiatal hernia. No gastroesophageal reflux was elicited on this limited scan. No gross esophageal mass or ulcer is detected. No gross esophageal stricture is visualized on these limited views. A swallowed 13 mm barium tablet became lodged at the esophagogastric junction for 1-2 minutes until additional barium was swallowed, with subsequent passage of the tablet into the stomach. The stomach is relatively collapsed. IMPRESSION: 1. Normal oral and pharyngeal phase of swallowing, with no laryngeal penetration or tracheobronchial aspiration. 2. Cricopharyngeus muscle dysfunction, characteristic of chronic gastroesophageal reflux disease. 3. Moderate esophageal dysmotility, with a pattern characteristic of chronic reflux related dysmotility. 4. Gastroesophageal reflux was not  directly elicited on this limited scan. No hiatal hernia. 5. Swallowed 13 mm barium tablet became lodged temporarily at the esophagogastric junction, cannot exclude a mild peptic stricture at this location. No gross esophageal mass. Consider correlation with upper endoscopy as clinically warranted. Electronically Signed   By: Delbert Phenix M.D.   On: 10/04/2016 11:19   Dg Chest Port 1 View  Result Date: 10/05/2016 CLINICAL DATA:  Fever EXAM: PORTABLE CHEST 1 VIEW COMPARISON:  10/02/2016 FINDINGS: Cardiomediastinal silhouette is stable. There is trace left pleural effusion left basilar atelectasis or infiltrate. No pulmonary edema. IMPRESSION: Trace left pleural effusion with left basilar atelectasis or infiltrate. No pulmonary edema. Electronically Signed   By: Natasha Mead M.D.   On: 10/05/2016 20:13   Dg Abd Portable 2v  Result Date: 10/05/2016 CLINICAL DATA:  Inpatient.  Fever.  Vomiting. EXAM: PORTABLE ABDOMEN - 2 VIEW COMPARISON:  09/18/2016 CT abdomen/ pelvis. FINDINGS: No dilated small bowel loops. Retained barium is seen throughout the nondilated colon. No evidence of  pneumatosis or pneumoperitoneum. Cholecystectomy clips are seen in the right upper quadrant of the abdomen. Mild thoracolumbar spondylosis. IMPRESSION: Nonobstructive bowel gas pattern. Retained barium throughout the nondilated colon. Electronically Signed   By: Delbert PhenixJason A Poff M.D.   On: 10/05/2016 20:11     Medications:  Scheduled: . amLODipine  5 mg Oral Daily  . aspirin  81 mg Oral Daily  . atorvastatin  10 mg Oral q1800  . carvedilol  3.125 mg Oral BID WC  . cefTRIAXone (ROCEPHIN)  IV  1 g Intravenous Q24H  . clopidogrel  75 mg Oral Daily  . enoxaparin (LOVENOX) injection  30 mg Subcutaneous Q24H  . escitalopram  20 mg Oral Daily  . insulin aspart  0-9 Units Subcutaneous TID WC  . insulin glargine  5 Units Subcutaneous Daily  . iron polysaccharides  150 mg Oral BID  . levothyroxine  88 mcg Oral QAC breakfast  .  metoCLOPramide (REGLAN) injection  10 mg Intravenous Q6H  . pantoprazole  40 mg Oral BID AC   Continuous: . sodium chloride     ZOX:[id number]:acetaminophen, dextrose, LORazepam, ondansetron (ZOFRAN) IV, oxyCODONE-acetaminophen, polyethylene glycol  Assessment/Plan:  Principal Problem:   Diabetic ketoacidosis with coma associated with type 1 diabetes mellitus (HCC) Active Problems:   Depression   Hypertension   CKD (chronic kidney disease) stage 3, GFR 30-59 ml/min   Hypothyroidism   Acute encephalopathy   Acute on chronic renal failure (HCC)   Hyperkalemia   Arterial hypotension    Fever Issue and developed fever on the evening of 10/21. Evaluation revealed an abnormal UA. Patient had been experiencing nausea, vomiting. So she likely has UTI, which could be the reason behind her fever. Other studies ordered include chest x-ray, abdominal film which were unremarkable. Blood cultures were ordered as well and are pending. Ceftriaxone was initiated last night and will be continued.  UTI See above. Patient on ceftriaxone. Urine cultures ordered.  Diabetes mellitus type 1 with Hypoglycemia Patient's blood glucose levels appear to have stabilized. Okay to continue Lantus at her lower dose for now. Continue Reglan in case there is a component of diabetic gastroparesis. A1c 9.2. Reason for patient's poor appetite is not entirely clear but could be due to her UTI. She had an unremarkable CT scan earlier this month. Barium swallow did not show any obvious strictures. Swallow evaluation was also unremarkable. Since she has had a few episodes of vomiting, diabetic gastroparesis is a possibility. She will be given Reglan and will monitor for improvement in symptoms. Continue PPI.   AKI on CKD (chronic kidney disease) stage 3, GFR 30-59 ml/min Baseline creatinine appears to be around 1.3. She did have acute renal failure and was started on fluids. Renal function had improved. However, due to poor oral  intake over the last 48 hours, creatinine has crept back up. Continue with IV fluids. Monitor urine output. Repeat labs tomorrow morning. Continue to hold losartan.  Severe Hypothyroidism Patient kept complaining of feeling cold. It does appear that she has not been compliant with her home medications, so it is likely that she has not been taking her Synthroid either. TSH was checked and was 89.4. For now, utilize intravenous Synthroid.   Diabetic ketoacidosis associated with type 1 diabetes mellitus Resolved. Patient was started on insulin infusion. She was given IV fluids. Patient's DKA has resolved. She was transitioned to subcutaneous insulin.  Questionable Diarrhea None noted in the hospital so far. Abdomen is benign. Continue to monitor.  Left proximal extremity swelling  Doppler studies did not show any DVT. Most likely due to fluids given to her during her multiple hospitalizations recently.   Anemia- Iron deficiency Hemoglobin is low but stable. No evidence for bleeding. Drop in hemoglobin is likely dilutional. Could consider blood transfusion, which help her symptomatically. Reassess tomorrow.  Hypotension with h/o Hypertension Patient was hypotensive in the ED. Her antihypertensive agents were held. Blood pressure is now in the hypertensive range. Antihypertensives resumed.  Hyperkalemia Potassium is now normal. Continue to monitor.   Depression Continue Lexapro.  DVT Prophylaxis: Lovenox    Code Status: Full code  Family Communication: Discussed with the patient. Discussed with her daughter, [name], over the phone. Disposition Plan: Continue management as outlined above. Anticipate discharge to SNF when medically ready.    LOS: 4 days   [facility]  [facility] Pager [phone] 10/06/2016, 8:04 AM  If 7PM-7AM, please contact night-coverage at [email or web address], password [facility]

## 2016-10-06 NOTE — Progress Notes (Signed)
Contacted on call hospitalist in regards to pt fsbs. See MAR for intervention.

## 2016-10-06 NOTE — Progress Notes (Signed)
PHARMACY - PHYSICIAN COMMUNICATION CRITICAL VALUE ALERT - BLOOD CULTURE IDENTIFICATION (BCID)  Results for orders placed or performed during the hospital encounter of 10/02/16  Blood Culture ID Panel (Reflexed) (Collected: 10/05/2016  7:17 PM)  Result Value Ref Range   Enterococcus species NOT DETECTED NOT DETECTED   Listeria monocytogenes NOT DETECTED NOT DETECTED   Staphylococcus species NOT DETECTED NOT DETECTED   Staphylococcus aureus NOT DETECTED NOT DETECTED   Streptococcus species NOT DETECTED NOT DETECTED   Streptococcus agalactiae NOT DETECTED NOT DETECTED   Streptococcus pneumoniae NOT DETECTED NOT DETECTED   Streptococcus pyogenes NOT DETECTED NOT DETECTED   Acinetobacter baumannii NOT DETECTED NOT DETECTED   Enterobacteriaceae species DETECTED (A) NOT DETECTED   Enterobacter cloacae complex NOT DETECTED NOT DETECTED   Escherichia coli NOT DETECTED NOT DETECTED   Klebsiella oxytoca NOT DETECTED NOT DETECTED   Klebsiella pneumoniae NOT DETECTED NOT DETECTED   Proteus species NOT DETECTED NOT DETECTED   Serratia marcescens NOT DETECTED NOT DETECTED   Carbapenem resistance NOT DETECTED NOT DETECTED   Haemophilus influenzae NOT DETECTED NOT DETECTED   Neisseria meningitidis NOT DETECTED NOT DETECTED   Pseudomonas aeruginosa NOT DETECTED NOT DETECTED   Candida albicans NOT DETECTED NOT DETECTED   Candida glabrata NOT DETECTED NOT DETECTED   Candida krusei NOT DETECTED NOT DETECTED   Candida parapsilosis NOT DETECTED NOT DETECTED   Candida tropicalis NOT DETECTED NOT DETECTED    Name of physician (or Provider) Contacted: Tama GanderKatherine Schorr, NP  Changes to prescribed antibiotics required: Change ceftriaxone to cefepime  Pharmacy will dose cefepime.  Start cefepime 1 gr IV q24h as current CrCl is about 25 ml/min.     Adalberto ColeNikola Real Cona, PharmD, BCPS Pager [phone] 10/06/2016 7:52 PM

## 2016-10-07 DIAGNOSIS — R7881 Bacteremia: Secondary | ICD-10-CM | POA: Diagnosis not present

## 2016-10-07 DIAGNOSIS — D696 Thrombocytopenia, unspecified: Secondary | ICD-10-CM

## 2016-10-07 LAB — BASIC METABOLIC PANEL
ANION GAP: 5 (ref 5–15)
BUN: 27 mg/dL — AB (ref 6–20)
CALCIUM: 6.9 mg/dL — AB (ref 8.9–10.3)
CO2: 21 mmol/L — ABNORMAL LOW (ref 22–32)
Chloride: 113 mmol/L — ABNORMAL HIGH (ref 101–111)
Creatinine, Ser: 1.9 mg/dL — ABNORMAL HIGH (ref 0.44–1.00)
GFR calc Af Amer: 30 mL/min — ABNORMAL LOW (ref >60)
GFR, EST NON AFRICAN AMERICAN: 26 mL/min — AB (ref >60)
GLUCOSE: 148 mg/dL — AB (ref 65–99)
Potassium: 4.1 mmol/L (ref 3.5–5.1)
SODIUM: 139 mmol/L (ref 135–145)

## 2016-10-07 LAB — CBC
HCT: 20 % — ABNORMAL LOW (ref 36.0–46.0)
Hemoglobin: 6.7 g/dL — CL (ref 12.0–15.0)
MCH: 30.3 pg (ref 26.0–34.0)
MCHC: 33.5 g/dL (ref 30.0–36.0)
MCV: 90.5 fL (ref 78.0–100.0)
PLATELETS: 50 10*3/uL — AB (ref 150–400)
RBC: 2.21 MIL/uL — ABNORMAL LOW (ref 3.87–5.11)
RDW: 15.1 % (ref 11.5–15.5)
WBC: 13.3 10*3/uL — AB (ref 4.0–10.5)

## 2016-10-07 LAB — HEPATIC FUNCTION PANEL
ALT: 143 U/L — ABNORMAL HIGH (ref 14–54)
AST: 129 U/L — AB (ref 15–41)
Albumin: 1.6 g/dL — ABNORMAL LOW (ref 3.5–5.0)
Alkaline Phosphatase: 119 U/L (ref 38–126)
BILIRUBIN DIRECT: 0.1 mg/dL (ref 0.1–0.5)
BILIRUBIN TOTAL: 0.5 mg/dL (ref 0.3–1.2)
Indirect Bilirubin: 0.4 mg/dL (ref 0.3–0.9)
Total Protein: 4.2 g/dL — ABNORMAL LOW (ref 6.5–8.1)

## 2016-10-07 LAB — GLUCOSE, CAPILLARY
GLUCOSE-CAPILLARY: 84 mg/dL (ref 65–99)
GLUCOSE-CAPILLARY: 122 mg/dL — AB (ref 65–99)
GLUCOSE-CAPILLARY: 108 mg/dL — AB (ref 65–99)
Glucose-Capillary: 144 mg/dL — ABNORMAL HIGH (ref 65–99)
Glucose-Capillary: 155 mg/dL — ABNORMAL HIGH (ref 65–99)

## 2016-10-07 LAB — T4, FREE: Free T4: 0.59 ng/dL — ABNORMAL LOW (ref 0.61–1.12)

## 2016-10-07 LAB — PREPARE RBC (CROSSMATCH)

## 2016-10-07 MED ORDER — SODIUM CHLORIDE 0.9 % IV SOLN: Freq: Once | INTRAVENOUS | Status: DC

## 2016-10-07 MED ORDER — FAMOTIDINE 20 MG PO TABS
20.0000 mg | ORAL_TABLET | Freq: Every day | ORAL | Status: DC
  Administered 2016-10-07 – 2016-10-08 (×2): 20 mg via ORAL
  Filled 2016-10-07 (×2): qty 1

## 2016-10-07 MED ORDER — SODIUM CHLORIDE 0.9 % IV BOLUS (SEPSIS)
500.0000 mL | Freq: Once | INTRAVENOUS | Status: AC
  Administered 2016-10-07: 500 mL via INTRAVENOUS

## 2016-10-07 MED ORDER — ACETAMINOPHEN 325 MG PO TABS
650.0000 mg | ORAL_TABLET | Freq: Four times a day (QID) | ORAL | Status: DC | PRN
  Administered 2016-10-07 (×2): 650 mg via ORAL
  Filled 2016-10-07 (×2): qty 2

## 2016-10-07 NOTE — Progress Notes (Signed)
CSW assisting with d/c planning. Pt has a SNF bed at [facility] when she is stable for d/c. CSW will continue to follow to assist with d/c planning ng to SNF.  Cori RazorJamie Keidra Withers LCSW [phone]

## 2016-10-07 NOTE — Progress Notes (Signed)
[facility] PROGRESS NOTE  Cassandra [name] ZOX:[id number] DOB: 05/24/1946 DOA: 10/02/2016  PCP: Irving Copas, MD  Brief History/Interval Summary: 71 year old Caucasian female with a past medical history of DM 1, CKD 3, HTN, hypothyoid who came into the ER yesterday for abdominal pain and diarrhea. Last admitted for DKA on 10-6 through 10/9. She was found to have a glucose of 500 which improved after treatment to 124. She was discharged home but did not take her medications and returned to the ER. Patient was found to be in DKA and was hospitalized for further management. Patient was transitioned off of her IV insulin once her DKA resolved. Subsequently, patient developed hypoglycemia. And then on the evening of 10/21 she developed a fever. She'll found to have a UTI. Subsequently also noted to be bacteremic. Had a significantly elevated TSH.  Reason for Visit: UTI   Consultants: None  Procedures: None  Antibiotics: None  Subjective/Interval History: Patient seems to be much more awake and alert today. She was able to tolerate liquid at all day yesterday, however, did have an episode of emesis at 3:00 this morning. She states that she does have some discomfort in her abdomen. Remains distracted, however.   ROS: Denies any chest pain or shortness of breath.   Objective:  Vital Signs  Vitals:   10/06/16 1309 10/06/16 1812 10/06/16 1959 10/07/16 0526  BP: (!) 97/50  (!) 103/48 119/69  Pulse: 88  77 77  Resp: 16  14 16   Temp: (!) 100.8 F (38.2 C) 99.7 F (37.6 C) 99.9 F (37.7 C) 99.2 F (37.3 C)  TempSrc: Oral Oral Oral Oral  SpO2: 96%  96% 95%  Weight:      Height:        Intake/Output Summary (Last 24 hours) at 10/07/16 1141 Last data filed at 10/07/16 8119  Gross per 24 hour  Intake           1307.5 ml  Output              150 ml  Net           1157.5 ml   Filed Weights   10/02/16 1250 10/02/16 1336 10/03/16 2105  Weight: 54.4 kg (120 lb) 54.4  kg (120 lb) 56.9 kg (125 lb 7.1 oz)    General appearance: alert, cooperative, distracted and no distress Resp: clear to auscultation bilaterally Cardio: regular rate and rhythm, S1, S2 normal, no murmur, click, rub or gallop GI: Abdomen remains soft. However, some tenderness elicited in the right quadrant without any rebound, rigidity or guarding.  bowel sounds normal; no masses,  no organomegaly. Neurologic: Awake, alert. Distracted. No obvious focal neuro deficits noted.  Lab Results:  Data Reviewed: I have personally reviewed following labs and imaging studies  CBC:  Recent Labs Lab 10/02/16 1306 10/04/16 0435 10/05/16 0415 10/06/16 0409 10/07/16 0337  WBC 13.4* 10.2 5.0 13.2* 13.3*  HGB 8.8* 7.9* 7.8* 7.3* 6.7*  HCT 28.3* 23.3* 23.5* 22.2* 20.0*  MCV 98.3 90.7 90.7 92.9 90.5  PLT 359 249 208 96* 50*    Basic Metabolic Panel:  Recent Labs Lab 10/03/16 1738 10/04/16 0435 10/05/16 0415 10/06/16 0409 10/07/16 0337  NA 139 138 138 138 139  K 4.2 4.0 3.9 4.3 4.1  CL 114* 113* 113* 112* 113*  CO2 19* 19* 21* 18* 21*  GLUCOSE 103* 105* 57* 341* 148*  BUN 27* 24* 20 24* 27*  CREATININE 2.04* 1.76* 1.28* 1.70* 1.90*  CALCIUM 7.0*  6.9* 6.9* 7.0* 6.9*    GFR: Estimated Creatinine Clearance: 22.1 mL/min (by C-G formula based on SCr of 1.9 mg/dL (H)).  Liver Function Tests:  Recent Labs Lab 10/01/16 1454 10/07/16 0337  AST 25 129*  ALT 15 143*  ALKPHOS 67 119  BILITOT 0.9 0.5  PROT 5.4* 4.2*  ALBUMIN 2.5* 1.6*     Recent Labs Lab 10/01/16 1454  LIPASE 42   Cardiac Enzymes:  Recent Labs Lab 10/02/16 1446  TROPONINI <0.03    CBG:  Recent Labs Lab 10/06/16 1737 10/06/16 1829 10/06/16 1957 10/07/16 0257 10/07/16 0742  GLUCAP 104* 115* 155* 144* 155*     Recent Results (from the past 240 hour(s))  MRSA PCR Screening     Status: None   Collection Time: 10/02/16  7:01 PM  Result Value Ref Range Status   MRSA by PCR NEGATIVE NEGATIVE  Final    Comment:        The GeneXpert MRSA Assay (FDA approved for NASAL specimens only), is one component of a comprehensive MRSA colonization surveillance program. It is not intended to diagnose MRSA infection nor to guide or monitor treatment for MRSA infections.   Culture, blood (Routine X 2) w Reflex to ID Panel     Status: Abnormal (Preliminary result)   Collection Time: 10/05/16  7:08 PM  Result Value Ref Range Status   Specimen Description BLOOD LEFT ANTECUBITAL  Final   Special Requests BOTTLES DRAWN AEROBIC ONLY 5CC  Final   Culture  Setup Time   Final    GRAM NEGATIVE RODS AEROBIC BOTTLE ONLY CRITICAL RESULT CALLED TO, READ BACK BY AND VERIFIED WITH: N GLOGOVAC,PHARMD AT 1930 10/06/16 BY L BENFIELD [facility]    Culture ENTEROBACTER AEROGENES (A)  Final   Report Status PENDING  Incomplete  Culture, blood (Routine X 2) w Reflex to ID Panel     Status: None (Preliminary result)   Collection Time: 10/05/16  7:17 PM  Result Value Ref Range Status   Specimen Description BLOOD LEFT HAND  Final   Special Requests BOTTLES DRAWN AEROBIC ONLY 5CC  Final   Culture  Setup Time   Final    GRAM NEGATIVE RODS AEROBIC BOTTLE ONLY CRITICAL RESULT CALLED TO, READ BACK BY AND VERIFIED WITH: N GLOGOVAC,PHARMD AT 1930 10/06/16 BY L BENFIELD [facility]    Culture GRAM NEGATIVE RODS  Final   Report Status PENDING  Incomplete  Blood Culture ID Panel (Reflexed)     Status: Abnormal   Collection Time: 10/05/16  7:17 PM  Result Value Ref Range Status   Enterococcus species NOT DETECTED NOT DETECTED Final   Listeria monocytogenes NOT DETECTED NOT DETECTED Final   Staphylococcus species NOT DETECTED NOT DETECTED Final   Staphylococcus aureus NOT DETECTED NOT DETECTED Final   Streptococcus species NOT DETECTED NOT DETECTED Final   Streptococcus agalactiae NOT DETECTED NOT DETECTED Final   Streptococcus pneumoniae NOT DETECTED NOT DETECTED Final    Streptococcus pyogenes NOT DETECTED NOT DETECTED Final   Acinetobacter baumannii NOT DETECTED NOT DETECTED Final   Enterobacteriaceae species DETECTED (A) NOT DETECTED Final    Comment: CRITICAL RESULT CALLED TO, READ BACK BY AND VERIFIED WITH: N GLOGOVAC,PHARMD AT 1930 10/06/16 BY L BENFIELD    Enterobacter cloacae complex NOT DETECTED NOT DETECTED Final   Escherichia coli NOT DETECTED NOT DETECTED Final   Klebsiella oxytoca NOT DETECTED NOT DETECTED Final   Klebsiella pneumoniae NOT DETECTED NOT DETECTED Final  Proteus species NOT DETECTED NOT DETECTED Final   Serratia marcescens NOT DETECTED NOT DETECTED Final   Carbapenem resistance NOT DETECTED NOT DETECTED Final   Haemophilus influenzae NOT DETECTED NOT DETECTED Final   Neisseria meningitidis NOT DETECTED NOT DETECTED Final   Pseudomonas aeruginosa NOT DETECTED NOT DETECTED Final   Candida albicans NOT DETECTED NOT DETECTED Final   Candida glabrata NOT DETECTED NOT DETECTED Final   Candida krusei NOT DETECTED NOT DETECTED Final   Candida parapsilosis NOT DETECTED NOT DETECTED Final   Candida tropicalis NOT DETECTED NOT DETECTED Final    Comment: Performed at [facility]      Radiology Studies: Dg Chest Port 1 View  Result Date: 10/05/2016 CLINICAL DATA:  Fever EXAM: PORTABLE CHEST 1 VIEW COMPARISON:  10/02/2016 FINDINGS: Cardiomediastinal silhouette is stable. There is trace left pleural effusion left basilar atelectasis or infiltrate. No pulmonary edema. IMPRESSION: Trace left pleural effusion with left basilar atelectasis or infiltrate. No pulmonary edema. Electronically Signed   By: Natasha Mead M.D.   On: 10/05/2016 20:13   Dg Abd Portable 2v  Result Date: 10/05/2016 CLINICAL DATA:  Inpatient.  Fever.  Vomiting. EXAM: PORTABLE ABDOMEN - 2 VIEW COMPARISON:  09/18/2016 CT abdomen/ pelvis. FINDINGS: No dilated small bowel loops. Retained barium is seen throughout the nondilated colon. No evidence of pneumatosis or  pneumoperitoneum. Cholecystectomy clips are seen in the right upper quadrant of the abdomen. Mild thoracolumbar spondylosis. IMPRESSION: Nonobstructive bowel gas pattern. Retained barium throughout the nondilated colon. Electronically Signed   By: Delbert Phenix M.D.   On: 10/05/2016 20:11     Medications:  Scheduled: . sodium chloride   Intravenous Once  . amLODipine  5 mg Oral Daily  . aspirin  81 mg Oral Daily  . atorvastatin  10 mg Oral q1800  . carvedilol  3.125 mg Oral BID WC  . ceFEPime (MAXIPIME) IV  1 g Intravenous Q24H  . clopidogrel  75 mg Oral Daily  . escitalopram  20 mg Oral Daily  . famotidine  20 mg Oral QHS  . insulin aspart  0-9 Units Subcutaneous TID WC  . insulin glargine  5 Units Subcutaneous Daily  . iron polysaccharides  150 mg Oral BID  . levothyroxine  75 mcg Intravenous Daily  . metoCLOPramide (REGLAN) injection  10 mg Intravenous Q6H  . pantoprazole  40 mg Oral BID AC   Continuous:   WUJ:[id number], acetaminophen, dextrose, LORazepam, ondansetron (ZOFRAN) IV, oxyCODONE-acetaminophen, polyethylene glycol  Assessment/Plan:  Principal Problem:   Diabetic ketoacidosis with coma associated with type 1 diabetes mellitus (HCC) Active Problems:   Depression   Hypertension   CKD (chronic kidney disease) stage 3, GFR 30-59 ml/min   Hypothyroidism   Acute encephalopathy   Acute on chronic renal failure (HCC)   Hyperkalemia   Arterial hypotension    Fever with bacteremia Probably secondary to urinary tract infection and bacteremia. Blood cultures and urine culture is growing gram-negative rods. Enterobacter species. Patient was switched over to cefepime which will be continued for now.   UTI See above. Patient is now on cefepime. Follow-up urine cultures.  Transaminitis LFTs have bumped up. She is status post cholecystectomy. Bilirubin is normal. Could be due to acute infection. We will obtain right upper quadrant  ultrasound.  Thrombocytopenia Platelet counts continued to decrease. Patient was in Lovenox, which was discontinued yesterday. Most probably secondary to acute infection. However, cannot rule out HIT. Antibody will be ordered.  Diabetes mellitus type 1 with Hypoglycemia Glucose levels have  been stable for the last 48 hours. No further episodes of hypoglycemia. Continue Lantus at a lower dose. Continue Reglan for now in case there is a component of diabetic gastroparesis considering her nausea and vomiting. Her nausea, vomiting has also improved. HbA1c was 9.2. Continue PPI.   Nausea and vomiting Likely multifactorial including diabetic gastroparesis and acute UTI. Seems to be improving. Continue to monitor.  AKI on CKD (chronic kidney disease) stage 3, GFR 30-59 ml/min Baseline creatinine appears to be around 1.3. She did have acute renal failure and was started on fluids. Renal function had improved. However, due to poor oral intake over the last few days, creatinine has crept back up. Continue with IV fluids. Monitor urine output. Continue to hold losartan.  Severe Hypothyroidism Patient kept complaining of feeling cold. It does appear that she has not been compliant with her home medications, so it is likely that she has not been taking her Synthroid either. TSH was checked and was 89.4. Free T4 is 0.59. For now, utilize intravenous Synthroid.   Diabetic ketoacidosis associated with type 1 diabetes mellitus Patient's DKA has resolved. She was transitioned to subcutaneous insulin.  Questionable Diarrhea None noted in the hospital so far. Abdomen is benign. Continue to monitor.  Left proximal extremity swelling Doppler studies did not show any DVT. Most likely due to fluids given to her during her multiple hospitalizations recently.   Anemia- Iron deficiency Hemoglobin is dropped some more. No evidence for overt bleeding. Drop is likely dilutional. Will transfuse PRBC today. Continue to  monitor.   Essential hypertension Patient was hypotensive in the ED. Her antihypertensive agents were held. Blood pressure is now in the hypertensive range. Antihypertensives resumed.  Hyperkalemia Potassium is now normal. Continue to monitor.   Depression Continue Lexapro.  DVT Prophylaxis: Lovenox    Code Status: Full code  Family Communication: Discussed with the patient. Discussed with her daughter, [name], over the phone. Disposition Plan: Continue management as outlined above. Anticipate discharge to SNF when medically ready.    LOS: 5 days   [facility]  [facility] Pager [phone] 10/07/2016, 11:41 AM  If 7PM-7AM, please contact night-coverage at [email or web address], password [facility]

## 2016-10-07 NOTE — Progress Notes (Signed)
CRITICAL VALUE ALERT  Critical value received:  Hemoglobin 6.7  Date of notification:  10/07/16  Time of notification:  0417  Critical value read back:Yes.    Nurse who received alert:  Barnett HatterMelinda Kallam RN  MD notified (1st page):  Schorr NP  Time of first page:  [phone]  MD notified (2nd page):  Time of second page:  Responding MD: Schorr NP  Time MD responded:

## 2016-10-07 NOTE — Plan of Care (Signed)
Problem: Physical Regulation: Goal: Will remain free from infection Outcome: Not Progressing +UTI, +BC; on IV maxipime

## 2016-10-07 NOTE — Progress Notes (Signed)
PT Cancellation Note  Patient Details Name: Cassandra [name] MRN: [id number] DOB: [birth year]   Cancelled Treatment:    Reason Eval/Treat Not Completed: Fatigue/lethargy limiting ability to participate;Medical issues which prohibited therapy (pt stated she's too tired to do PT right now and requested I come back later, noted Hgb 6.7 and pt to receive transfusion today, will attempt later today. )   Tamala SerUhlenberg, Betzy Barbier Kistler 10/07/2016, 10:07 AM [phone]

## 2016-10-07 NOTE — Care Management Important Message (Signed)
Important Message  Patient Details  Name: Cassandra [name] MRN: [id number] Date of Birth: 04-Sep-1946   Medicare Important Message Given:  Yes    Haskell FlirtJamison, Courtnay Petrilla 10/07/2016, 10:12 AMImportant Message  Patient Details  Name: Cassandra [name] MRN: [id number] Date of Birth: 04-Sep-1946   Medicare Important Message Given:  Yes    Haskell FlirtJamison, Mizuki Hoel 10/07/2016, 10:12 AM

## 2016-10-07 NOTE — Progress Notes (Deleted)
Patient ambulated on room air.  Sats 96% ambulatory and 98% at rest on room air.  

## 2016-10-08 ENCOUNTER — Inpatient Hospital Stay (HOSPITAL_COMMUNITY): Payer: Medicare Other

## 2016-10-08 DIAGNOSIS — R740 Nonspecific elevation of levels of transaminase and lactic acid dehydrogenase [LDH]: Secondary | ICD-10-CM

## 2016-10-08 LAB — TYPE AND SCREEN
ABO/RH(D): A POS
Antibody Screen: NEGATIVE
Unit division: 0

## 2016-10-08 LAB — URINE CULTURE

## 2016-10-08 LAB — GLUCOSE, CAPILLARY
GLUCOSE-CAPILLARY: 192 mg/dL — AB (ref 65–99)
GLUCOSE-CAPILLARY: 90 mg/dL (ref 65–99)
Glucose-Capillary: 157 mg/dL — ABNORMAL HIGH (ref 65–99)
Glucose-Capillary: 87 mg/dL (ref 65–99)

## 2016-10-08 LAB — CBC
HEMATOCRIT: 29 % — AB (ref 36.0–46.0)
HEMOGLOBIN: 9.7 g/dL — AB (ref 12.0–15.0)
MCH: 29.8 pg (ref 26.0–34.0)
MCHC: 33.4 g/dL (ref 30.0–36.0)
MCV: 89.2 fL (ref 78.0–100.0)
Platelets: 77 10*3/uL — ABNORMAL LOW (ref 150–400)
RBC: 3.25 MIL/uL — AB (ref 3.87–5.11)
RDW: 15.4 % (ref 11.5–15.5)
WBC: 11 10*3/uL — ABNORMAL HIGH (ref 4.0–10.5)

## 2016-10-08 LAB — COMPREHENSIVE METABOLIC PANEL
ALK PHOS: 124 U/L (ref 38–126)
ALT: 113 U/L — AB (ref 14–54)
AST: 76 U/L — ABNORMAL HIGH (ref 15–41)
Albumin: 1.8 g/dL — ABNORMAL LOW (ref 3.5–5.0)
Anion gap: 6 (ref 5–15)
BUN: 26 mg/dL — ABNORMAL HIGH (ref 6–20)
CALCIUM: 7.2 mg/dL — AB (ref 8.9–10.3)
CO2: 21 mmol/L — AB (ref 22–32)
CREATININE: 1.69 mg/dL — AB (ref 0.44–1.00)
Chloride: 112 mmol/L — ABNORMAL HIGH (ref 101–111)
GFR, EST AFRICAN AMERICAN: 35 mL/min — AB (ref >60)
GFR, EST NON AFRICAN AMERICAN: 30 mL/min — AB (ref >60)
Glucose, Bld: 137 mg/dL — ABNORMAL HIGH (ref 65–99)
Potassium: 4.2 mmol/L (ref 3.5–5.1)
Sodium: 139 mmol/L (ref 135–145)
Total Bilirubin: 0.8 mg/dL (ref 0.3–1.2)
Total Protein: 4.6 g/dL — ABNORMAL LOW (ref 6.5–8.1)

## 2016-10-08 LAB — CULTURE, BLOOD (ROUTINE X 2)

## 2016-10-08 LAB — HEPARIN INDUCED PLATELET AB (HIT ANTIBODY): HEPARIN INDUCED PLT AB: 0.154 {OD_unit} (ref 0.000–0.400)

## 2016-10-08 LAB — VITAMIN B12: Vitamin B-12: 1118 pg/mL — ABNORMAL HIGH (ref 180–914)

## 2016-10-08 LAB — ABO/RH: ABO/RH(D): A POS

## 2016-10-08 MED ORDER — METOCLOPRAMIDE HCL 10 MG PO TABS
10.0000 mg | ORAL_TABLET | Freq: Three times a day (TID) | ORAL | Status: DC
  Administered 2016-10-08 – 2016-10-09 (×6): 10 mg via ORAL
  Filled 2016-10-08 (×6): qty 1

## 2016-10-08 MED ORDER — SODIUM CHLORIDE 0.9 % IV SOLN
INTRAVENOUS | Status: AC
  Administered 2016-10-08 – 2016-10-09 (×3): via INTRAVENOUS

## 2016-10-08 NOTE — Progress Notes (Signed)
PT Cancellation Note  Patient Details Name: Cassandra [name] MRN: [id number] DOB: Mar 28, 1946   Cancelled Treatment:    Reason Eval/Treat Not Completed: Fatigue/lethargy limiting ability to participate (patient reports having tests  and is fatigued.)   Rada HayHill, Sharniece Gibbon Elizabeth 10/08/2016, 4:42 PM Blanchard KelchKaren Andrianna Manalang PT [phone]

## 2016-10-08 NOTE — Progress Notes (Signed)
[facility] PROGRESS NOTE  Cassandra [name] RUE:[id number] DOB: 12-Jun-1946 DOA: 10/02/2016  PCP: Irving CopasHACKER,ROBERT KELLER, MD  Brief History/Interval Summary: 70 year old Caucasian female with a past medical history of DM 1, CKD 3, HTN, hypothyoid who came into the ER yesterday for abdominal pain and diarrhea. Last admitted for DKA on 10-6 through 10/9. She was found to have a glucose of 500 which improved after treatment to 124. She was discharged home but did not take her medications and returned to the ER. Patient was found to be in DKA and was hospitalized for further management. Patient was transitioned off of her IV insulin once her DKA resolved. Subsequently, patient developed hypoglycemia. And then on the evening of 10/21 she developed a fever. She'll found to have a UTI. Subsequently also noted to be bacteremic. Had a significantly elevated TSH.  Reason for Visit: UTI   Consultants: None  Procedures: None  Antibiotics: None  Subjective/Interval History: Patient much more awake and alert this morning. Continues to report poor appetite. However, she did tolerate solids yesterday. Denied any nausea or vomiting. Overall feels better.   ROS: Denies any chest pain or shortness of breath.   Objective:  Vital Signs  Vitals:   10/07/16 1446 10/07/16 1642 10/07/16 2130 10/08/16 0533  BP: 117/66 127/75 132/78 (!) 162/82  Pulse: 72 70 72 75  Resp: 16 16 16 16   Temp: 98.2 F (36.8 C) 99.2 F (37.3 C) 98.7 F (37.1 C) 98.7 F (37.1 C)  TempSrc: Oral Oral Oral Oral  SpO2: 98% 95% 92% 91%  Weight:      Height:        Intake/Output Summary (Last 24 hours) at 10/08/16 0749 Last data filed at 10/08/16 0555  Gross per 24 hour  Intake            652.5 ml  Output              571 ml  Net             81.5 ml   Filed Weights   10/02/16 1250 10/02/16 1336 10/03/16 2105  Weight: 54.4 kg (120 lb) 54.4 kg (120 lb) 56.9 kg (125 lb 7.1 oz)    General appearance: alert,  cooperative, distracted and no distress Resp: clear to auscultation bilaterally Cardio: regular rate and rhythm, S1, S2 normal, no murmur, click, rub or gallop GI: Abdomen remains soft. However, tenderness elicited in the right quadrant without any rebound, rigidity or guarding. Improved today. bowel sounds normal; no masses,  no organomegaly. Neurologic: Awake, alert. Distracted. No obvious focal neuro deficits noted.  Lab Results:  Data Reviewed: I have personally reviewed following labs and imaging studies  CBC:  Recent Labs Lab 10/04/16 0435 10/05/16 0415 10/06/16 0409 10/07/16 0337 10/08/16 0340  WBC 10.2 5.0 13.2* 13.3* 11.0*  HGB 7.9* 7.8* 7.3* 6.7* 9.7*  HCT 23.3* 23.5* 22.2* 20.0* 29.0*  MCV 90.7 90.7 92.9 90.5 89.2  PLT 249 208 96* 50* 77*    Basic Metabolic Panel:  Recent Labs Lab 10/04/16 0435 10/05/16 0415 10/06/16 0409 10/07/16 0337 10/08/16 0340  NA 138 138 138 139 139  K 4.0 3.9 4.3 4.1 4.2  CL 113* 113* 112* 113* 112*  CO2 19* 21* 18* 21* 21*  GLUCOSE 105* 57* 341* 148* 137*  BUN 24* 20 24* 27* 26*  CREATININE 1.76* 1.28* 1.70* 1.90* 1.69*  CALCIUM 6.9* 6.9* 7.0* 6.9* 7.2*    GFR: Estimated Creatinine Clearance: 24.8 mL/min (by C-G formula based  on SCr of 1.69 mg/dL (H)).  Liver Function Tests:  Recent Labs Lab 10/01/16 1454 10/07/16 0337 10/08/16 0340  AST 25 129* 76*  ALT 15 143* 113*  ALKPHOS 67 119 124  BILITOT 0.9 0.5 0.8  PROT 5.4* 4.2* 4.6*  ALBUMIN 2.5* 1.6* 1.8*     Recent Labs Lab 10/01/16 1454  LIPASE 42   Cardiac Enzymes:  Recent Labs Lab 10/02/16 1446  TROPONINI <0.03    CBG:  Recent Labs Lab 10/07/16 0742 10/07/16 1216 10/07/16 1652 10/07/16 2133 10/08/16 0646  GLUCAP 155* 84 122* 108* 192*     Recent Results (from the past 240 hour(s))  MRSA PCR Screening     Status: None   Collection Time: 10/02/16  7:01 PM  Result Value Ref Range Status   MRSA by PCR NEGATIVE NEGATIVE Final    Comment:         The GeneXpert MRSA Assay (FDA approved for NASAL specimens only), is one component of a comprehensive MRSA colonization surveillance program. It is not intended to diagnose MRSA infection nor to guide or monitor treatment for MRSA infections.   Culture, Urine     Status: Abnormal (Preliminary result)   Collection Time: 10/05/16  6:41 PM  Result Value Ref Range Status   Specimen Description URINE, RANDOM  Final   Special Requests NONE  Final   Culture >=100,000 COLONIES/mL ENTEROBACTER AEROGENES (A)  Final   Report Status PENDING  Incomplete  Culture, blood (Routine X 2) w Reflex to ID Panel     Status: Abnormal (Preliminary result)   Collection Time: 10/05/16  7:08 PM  Result Value Ref Range Status   Specimen Description BLOOD LEFT ANTECUBITAL  Final   Special Requests BOTTLES DRAWN AEROBIC ONLY 5CC  Final   Culture  Setup Time   Final    GRAM NEGATIVE RODS AEROBIC BOTTLE ONLY CRITICAL RESULT CALLED TO, READ BACK BY AND VERIFIED WITH: N GLOGOVAC,PHARMD AT 1930 10/06/16 BY L BENFIELD Performed at [facility]    Culture ENTEROBACTER AEROGENES (A)  Final   Report Status PENDING  Incomplete  Culture, blood (Routine X 2) w Reflex to ID Panel     Status: None (Preliminary result)   Collection Time: 10/05/16  7:17 PM  Result Value Ref Range Status   Specimen Description BLOOD LEFT HAND  Final   Special Requests BOTTLES DRAWN AEROBIC ONLY 5CC  Final   Culture  Setup Time   Final    GRAM NEGATIVE RODS AEROBIC BOTTLE ONLY CRITICAL RESULT CALLED TO, READ BACK BY AND VERIFIED WITH: N GLOGOVAC,PHARMD AT 1930 10/06/16 BY L BENFIELD Performed at [facility]    Culture GRAM NEGATIVE RODS  Final   Report Status PENDING  Incomplete  Blood Culture ID Panel (Reflexed)     Status: Abnormal   Collection Time: 10/05/16  7:17 PM  Result Value Ref Range Status   Enterococcus species NOT DETECTED NOT DETECTED Final   Listeria monocytogenes NOT DETECTED NOT DETECTED Final    Staphylococcus species NOT DETECTED NOT DETECTED Final   Staphylococcus aureus NOT DETECTED NOT DETECTED Final   Streptococcus species NOT DETECTED NOT DETECTED Final   Streptococcus agalactiae NOT DETECTED NOT DETECTED Final   Streptococcus pneumoniae NOT DETECTED NOT DETECTED Final   Streptococcus pyogenes NOT DETECTED NOT DETECTED Final   Acinetobacter baumannii NOT DETECTED NOT DETECTED Final   Enterobacteriaceae species DETECTED (A) NOT DETECTED Final    Comment: CRITICAL RESULT CALLED TO, READ BACK BY AND VERIFIED  WITH: N GLOGOVAC,PHARMD AT 1930 10/06/16 BY L BENFIELD    Enterobacter cloacae complex NOT DETECTED NOT DETECTED Final   Escherichia coli NOT DETECTED NOT DETECTED Final   Klebsiella oxytoca NOT DETECTED NOT DETECTED Final   Klebsiella pneumoniae NOT DETECTED NOT DETECTED Final   Proteus species NOT DETECTED NOT DETECTED Final   Serratia marcescens NOT DETECTED NOT DETECTED Final   Carbapenem resistance NOT DETECTED NOT DETECTED Final   Haemophilus influenzae NOT DETECTED NOT DETECTED Final   Neisseria meningitidis NOT DETECTED NOT DETECTED Final   Pseudomonas aeruginosa NOT DETECTED NOT DETECTED Final   Candida albicans NOT DETECTED NOT DETECTED Final   Candida glabrata NOT DETECTED NOT DETECTED Final   Candida krusei NOT DETECTED NOT DETECTED Final   Candida parapsilosis NOT DETECTED NOT DETECTED Final   Candida tropicalis NOT DETECTED NOT DETECTED Final    Comment: Performed at [facility]      Radiology Studies: No results found.   Medications:  Scheduled: . aspirin  81 mg Oral Daily  . atorvastatin  10 mg Oral q1800  . carvedilol  3.125 mg Oral BID WC  . ceFEPime (MAXIPIME) IV  1 g Intravenous Q24H  . clopidogrel  75 mg Oral Daily  . escitalopram  20 mg Oral Daily  . famotidine  20 mg Oral QHS  . insulin aspart  0-9 Units Subcutaneous TID WC  . insulin glargine  5 Units Subcutaneous Daily  . iron polysaccharides  150 mg Oral BID  .  levothyroxine  75 mcg Intravenous Daily  . metoCLOPramide  10 mg Oral TID AC & HS  . pantoprazole  40 mg Oral BID AC   Continuous: . sodium chloride     ZOX:[id number], acetaminophen, dextrose, LORazepam, ondansetron (ZOFRAN) IV, oxyCODONE-acetaminophen, polyethylene glycol  Assessment/Plan:  Principal Problem:   Diabetic ketoacidosis with coma associated with type 1 diabetes mellitus (HCC) Active Problems:   Depression   Hypertension   CKD (chronic kidney disease) stage 3, GFR 30-59 ml/min   Hypothyroidism   Acute encephalopathy   Acute on chronic renal failure (HCC)   Hyperkalemia   Arterial hypotension   Gram-negative bacteremia   Thrombocytopenia (HCC)    Fever with bacteremia Secondary to urinary tract infection and bacteremia. Blood cultures and urine culture is growing gram-negative rods. Enterobacter species. Patient was switched over to cefepime which will be continued for now. Await sensitivities.  UTI See above. Patient is now on cefepime. Follow-up urine cultures.  Transaminitis There was a rise in the patient's LFTs, improved this morning. She is status post cholecystectomy. Bilirubin is normal. Could be due to acute infection. Ultrasound of the right upper quadrant is pending.  Thrombocytopenia Platelet counts remain low, but some improvement is noted this morning. No evidence for any overt bleeding. Patient was on Lovenox, which was discontinued 10/22. Thrombocytopenia is probably secondary to acute infection. However, cannot rule out HIT. Antibody ordered.  Diabetes mellitus type 1 with Hypoglycemia Glucose levels have been stable for the last 72 hours. No further episodes of hypoglycemia. Continue Lantus at a lower dose. Continue Reglan for now in case there is a component of diabetic gastroparesis considering her nausea and vomiting. Her nausea, vomiting has also improved. HbA1c was 9.2. Continue PPI. Change Reglan to oral.  Nausea and vomiting Likely  multifactorial including diabetic gastroparesis and acute UTI. Seems to be improving. Continue to monitor.  AKI on CKD (chronic kidney disease) stage 3, GFR 30-59 ml/min Baseline creatinine appears to be around 1.3. She did  have acute renal failure and was started on fluids. Renal function had improved. However, due to poor oral intake, creatinine has crept back up. Started on IV fluids. Creatinine has improved this morning. Continue to monitor urine output. Continue IV fluids for another 24 hours. Repeat labs tomorrow morning. Continue to hold losartan.  Severe Hypothyroidism Patient kept complaining of feeling cold. It does appear that she has not been compliant with her home medications, so it is likely that she has not been taking her Synthroid either. TSH was checked and was 89.4. Free T4 is 0.59. Patient was placed on intravenous Synthroid. Continue IV for today. If her oral intake improves and if she does not have any further episodes of nausea, vomiting, this can be transitioned to oral tomorrow.    Diabetic ketoacidosis associated with type 1 diabetes mellitus Patient's DKA has resolved. She was transitioned to subcutaneous insulin.  Questionable Diarrhea None noted in the hospital so far. Abdomen is benign. Continue to monitor.  Left proximal extremity swelling Doppler studies did not show any DVT. Most likely due to fluids given to her during her multiple hospitalizations recently.   Anemia- Iron deficiency Patient did have a drop in her hemoglobin yesterday which was likely due to effusion. She was transfused PRBC. Hemoglobin has responded appropriately. Continue to monitor.   Essential hypertension Patient was hypotensive in the ED. Now stable. Antihypertensives resumed.  Hyperkalemia Potassium is now normal. Continue to monitor.   Depression Continue Lexapro.  DVT Prophylaxis: Lovenox    Code Status: Full code  Family Communication: Discussed with the patient.  Discussed with her daughter, [name], over the phone. Disposition Plan: Continue management as outlined above. Anticipate discharge to SNF when medically ready. Mobilize.    LOS: 6 days   [facility]  [facility] Pager [phone] 10/08/2016, 7:49 AM  If 7PM-7AM, please contact night-coverage at [email or web address], password [email or web address]

## 2016-10-08 NOTE — Care Management Note (Signed)
Case Management Note  Patient Details  Name: Cassandra [name] MRN: [id number] Date of Birth: Feb 08, 1946  Subjective/Objective:    70 yo admitted with Diabetic Ketoacidosis with coma.                Action/Plan: Pt from home with spouse and to DC to SNF.   Expected Discharge Date:   (unknown)               Expected Discharge Plan:  Skilled Nursing Facility  In-House Referral:  Clinical Social Work  Discharge planning Services  CM Consult  Post Acute Care Choice:    Choice offered to:     DME Arranged:    DME Agency:     HH Arranged:    HH Agency:     Status of Service:  In process, will continue to follow  If discussed at Long Length of Stay Meetings, dates discussed:    Additional CommentsBartholome Bill:  Ayodeji Keimig H, RN 10/08/2016, 11:50 AM  [phone]

## 2016-10-09 DIAGNOSIS — E1011 Type 1 diabetes mellitus with ketoacidosis with coma: Principal | ICD-10-CM

## 2016-10-09 LAB — COMPREHENSIVE METABOLIC PANEL WITH GFR
ALT: 81 U/L — ABNORMAL HIGH (ref 14–54)
AST: 37 U/L (ref 15–41)
Albumin: 1.8 g/dL — ABNORMAL LOW (ref 3.5–5.0)
Alkaline Phosphatase: 122 U/L (ref 38–126)
Anion gap: 5 (ref 5–15)
BUN: 20 mg/dL (ref 6–20)
CO2: 20 mmol/L — ABNORMAL LOW (ref 22–32)
Calcium: 7.2 mg/dL — ABNORMAL LOW (ref 8.9–10.3)
Chloride: 114 mmol/L — ABNORMAL HIGH (ref 101–111)
Creatinine, Ser: 1.15 mg/dL — ABNORMAL HIGH (ref 0.44–1.00)
GFR calc Af Amer: 55 mL/min — ABNORMAL LOW
GFR calc non Af Amer: 47 mL/min — ABNORMAL LOW
Glucose, Bld: 107 mg/dL — ABNORMAL HIGH (ref 65–99)
Potassium: 3.9 mmol/L (ref 3.5–5.1)
Sodium: 139 mmol/L (ref 135–145)
Total Bilirubin: 0.7 mg/dL (ref 0.3–1.2)
Total Protein: 4.5 g/dL — ABNORMAL LOW (ref 6.5–8.1)

## 2016-10-09 LAB — CBC
HCT: 30 % — ABNORMAL LOW (ref 36.0–46.0)
HEMOGLOBIN: 10.4 g/dL — AB (ref 12.0–15.0)
MCH: 30.8 pg (ref 26.0–34.0)
MCHC: 34.7 g/dL (ref 30.0–36.0)
MCV: 88.8 fL (ref 78.0–100.0)
Platelets: 99 10*3/uL — ABNORMAL LOW (ref 150–400)
RBC: 3.38 MIL/uL — ABNORMAL LOW (ref 3.87–5.11)
RDW: 14.9 % (ref 11.5–15.5)
WBC: 7.5 10*3/uL (ref 4.0–10.5)

## 2016-10-09 LAB — GLUCOSE, CAPILLARY
Glucose-Capillary: 152 mg/dL — ABNORMAL HIGH (ref 65–99)
Glucose-Capillary: 136 mg/dL — ABNORMAL HIGH (ref 65–99)

## 2016-10-09 MED ORDER — LORAZEPAM 0.5 MG PO TABS: 0.5000 mg | ORAL_TABLET | Freq: Three times a day (TID) | ORAL | 0 refills | Status: DC | PRN

## 2016-10-09 MED ORDER — OXYCODONE-ACETAMINOPHEN 5-325 MG PO TABS: 1.0000 | ORAL_TABLET | ORAL | 0 refills | Status: DC | PRN

## 2016-10-09 MED ORDER — LEVOTHYROXINE SODIUM 100 MCG PO TABS
100.0000 ug | ORAL_TABLET | Freq: Every day | ORAL | Status: DC
  Administered 2016-10-09: 100 ug via ORAL
  Filled 2016-10-09: qty 1

## 2016-10-09 MED ORDER — CEFPODOXIME PROXETIL 200 MG PO TABS
200.0000 mg | ORAL_TABLET | Freq: Two times a day (BID) | ORAL | Status: DC
  Administered 2016-10-09: 200 mg via ORAL
  Filled 2016-10-09: qty 1

## 2016-10-09 MED ORDER — LEVOTHYROXINE SODIUM 100 MCG PO TABS: 100.0000 ug | ORAL_TABLET | Freq: Every day | ORAL | 0 refills | Status: DC

## 2016-10-09 MED ORDER — CEFPODOXIME PROXETIL 200 MG PO TABS
200.0000 mg | ORAL_TABLET | Freq: Two times a day (BID) | ORAL | 0 refills | Status: DC
Start: 2016-10-09 — End: 2016-10-23

## 2016-10-09 NOTE — Clinical Social Work Placement (Signed)
   CLINICAL SOCIAL WORK PLACEMENT  NOTE  Date:  10/09/2016  Patient Details  Name: Cassandra [name] MRN: [id number] Date of Birth: [birth year]  Clinical Social Work is seeking post-discharge placement for this patient at the Skilled  Nursing Facility level of care (*CSW will initial, date and re-position this form in  chart as items are completed):  Yes   Patient/family provided with [facility] Clinical Social Work Department's list of facilities offering this level of care within the geographic area requested by the patient (or if unable, by the patient's family).  Yes   Patient/family informed of their freedom to choose among providers that offer the needed level of care, that participate in Medicare, Medicaid or managed care program needed by the patient, have an available bed and are willing to accept the patient.  Yes   Patient/family informed of [facility]'s ownership interest in [facility] and [facility], as well as of the fact that they are under no obligation to receive care at these facilities.  PASRR submitted to EDS on       PASRR number received on       Existing PASRR number confirmed on       FL2 transmitted to all facilities in geographic area requested by pt/family on 10/04/16     FL2 transmitted to all facilities within larger geographic area on       Patient informed that his/her managed care company has contracts with or will negotiate with certain facilities, including the following:        Yes   Patient/family informed of bed offers received.  [facility]     Physician recommends and patient chooses bed at      Patient to be transferred to [facility] on 10/09/16.  Patient to be transferred to facility by CAR     Patient family notified on 10/09/16 of transfer.  Name of family member notified:  DAUGHTER     PHYSICIAN       Additional Comment: Pt / family are in agreement with d/c to [facility] today. PT has approved transport by car. D/C Summary has been sent to SNF for review. Scripts included in d/c packet. # for report provided to nsg. D/C packet provided to pt.   _______________________________________________ Royetta AsalHaidinger, Keleigh Kazee Lee, LCSW  [phone] 10/09/2016, 2:56 PM

## 2016-10-09 NOTE — Discharge Summary (Signed)
Physician Discharge Summary  Cassandra [name] WUJ:[id number] DOB: 06-26-1946 DOA: 10/02/2016  PCP: Cammy Copa, MD  Admit date: 10/02/2016 Discharge date: 10/09/2016  Admitted From: Home Disposition:  SNF  Recommendations for Outpatient Follow-up:  1. Follow up with PCP in 1-2 weeks 2. Repeat comprehensive metabolic panel within 1 week 3. Please titrate insulin as needed to maintain euglycemia 4. Seven more days of antibiotics, start on 10/09/16 at PM dose (AM dose already given in hospital) 5. Recommend outpatient MRCP in one week. If abnormal findings, recommend referral to GI for possible outpatient ERCP  Discharge Condition:Improved CODE STATUS:Full Diet recommendation: Diabetic    Controlled Substance Registry reviewed. Last rx for ativan dispensed in 5/17.  Brief/Interim Summary: 70 year old Caucasian female with a past medical history of DM 1, CKD 3, HTN, hypothyoid who came into the ER yesterday for abdominal pain and diarrhea. Last admitted for DKA on 10-6 through 10/9. She was found to have a glucose of 500 which improved after treatment to 124. She was discharged home but did not take her medications and returned to the ER. Patient was found to be in DKA and was hospitalized for further management. Patient was transitioned off of her IV insulin once her DKA resolved. Subsequently, patient developed hypoglycemia. And then on the evening of 10/21 she developed a fever. She'll found to have a UTI. Subsequently also noted to be bacteremic. Had a significantly elevated TSH  Fever with bacteremia Secondary to urinary tract infection and bacteremia. Blood cultures and urine culture is growing gram-negative rods. Enterobacter species. Patient was switched over to cefepime. Patient later transitioned to vantin, to complete 7 more days after discharge  UTI See above. Patient was continued on cefepime. Patient to complete 7 more days of Vantin on  discharge.  Transaminitis There was a rise in the patient's LFTs, improved by day of discharge. She is status post cholecystectomy. Bilirubin is normal. Could be due to acute infection. Ultrasound of the right upper quadrant was notable for a dilated CBD of 18m with echogenic material within the CBD. Alk phos remained normal. Total bili remained normal. Discussed case with Dr. E[facility]letta Lamas([facility] who recommends outpatient MRCP in one week. If findings are abnormal, then recommend outpatient GI referral for consideration for ERCP  Thrombocytopenia Platelet counts remain low, but some improvement is noted this morning. No evidence for any overt bleeding. Platelets improving  Diabetes mellitus type 1 with Hypoglycemia Glucose levels have been stable for the last 72 hours. No further episodes of hypoglycemia. Continued Reglan for now in case there is a component of diabetic gastroparesis considering her nausea and vomiting. Her nausea, vomiting has also improved. HbA1c was 9.2.   Nausea and vomiting Likely multifactorial including diabetic gastroparesis and acute UTI. Seems to be improving. Continue to monitor.  AKI on CKD (chronic kidney disease) stage 3, GFR 30-59 ml/min Baseline creatinine appears to be around 1.3. She did have acute renal failure and was started on fluids. Renal function had improved.  Severe Hypothyroidism Patient kept complaining of feeling cold. It does appear that she has not been compliant with her home medications, so it is likely that she has not been taking her Synthroid either. TSH was checked and was 89.4. Free T4 is 0.59. Patient was placed on intravenous Synthroid. Patient's thyroid replacement dose increased at time of discharge.    Diabetic ketoacidosis associated with type 1 diabetes mellitus Patient's DKA has resolved. She was transitioned to subcutaneous insulin.  Questionable Diarrhea None noted in the  hospital so far. Abdomen is  benign. Continue to monitor.  Left proximal extremity swelling Doppler studies did not show any DVT. Most likely due to fluids given to her during her multiple hospitalizations recently.   Anemia- Iron deficiency Patient did have a drop in her hemoglobin yesterday which was likely due to effusion. She was transfused PRBC..   Essential hypertension Patient was hypotensive in the ED. Now stable. Antihypertensives resumed.  Hyperkalemia Potassium is now normal. Continue to monitor.   Depression Continue Lexapro.  Discharge Diagnoses:  Principal Problem:   Diabetic ketoacidosis with coma associated with type 1 diabetes mellitus ([place]) Active Problems:   Depression   Hypertension   CKD (chronic kidney disease) stage 3, GFR 30-59 ml/min   Hypothyroidism   Acute encephalopathy   Acute on chronic renal failure (HCC)   Hyperkalemia   Arterial hypotension   Gram-negative bacteremia   Thrombocytopenia (HCC)    Discharge Instructions     Medication List    TAKE these medications   amLODipine 5 MG tablet Commonly known as:  NORVASC Take 5 mg by mouth daily.   aspirin 81 MG chewable tablet Chew 1 tablet (81 mg total) by mouth daily.   atorvastatin 10 MG tablet Commonly known as:  LIPITOR Take 1 tablet (10 mg total) by mouth daily at 6 PM.   carvedilol 3.125 MG tablet Commonly known as:  COREG Take 1 tablet (3.125 mg total) by mouth 2 (two) times daily with a meal.   cefpodoxime 200 MG tablet Commonly known as:  VANTIN Take 1 tablet (200 mg total) by mouth every 12 (twelve) hours.   clopidogrel 75 MG tablet Commonly known as:  PLAVIX Take 75 mg by mouth daily.   escitalopram 20 MG tablet Commonly known as:  LEXAPRO Take 20 mg by mouth daily.   HUMALOG 100 UNIT/ML injection Generic drug:  insulin lispro Inject 0.05 mLs (5 Units total) into the skin 3 (three) times daily with meals.   hydrochlorothiazide 25 MG tablet Commonly known as:  HYDRODIURIL Take  0.5 tablets (12.5 mg total) by mouth daily.   insulin glargine 100 UNIT/ML injection Commonly known as:  LANTUS Inject 0.08 mLs (8 Units total) into the skin daily.   iron polysaccharides 150 MG capsule Commonly known as:  NIFEREX Take 1 capsule (150 mg total) by mouth 2 (two) times daily. What changed:  when to take this   levothyroxine 100 MCG tablet Commonly known as:  SYNTHROID, LEVOTHROID Take 1 tablet (100 mcg total) by mouth daily before breakfast. Start taking on:  10/10/2016 What changed:  medication strength  how much to take   LORazepam 0.5 MG tablet Commonly known as:  ATIVAN Take 1 tablet (0.5 mg total) by mouth every 8 (eight) hours as needed for anxiety. What changed:  Another medication with the same name was added. Make sure you understand how and when to take each.   LORazepam 0.5 MG tablet Commonly known as:  ATIVAN Take 1 tablet (0.5 mg total) by mouth every 8 (eight) hours as needed for anxiety. What changed:  You were already taking a medication with the same name, and this prescription was added. Make sure you understand how and when to take each.   losartan 25 MG tablet Commonly known as:  COZAAR Take 1 tablet (25 mg total) by mouth daily.   meclizine 25 MG tablet Commonly known as:  ANTIVERT Take 1 tablet (25 mg total) by mouth 3 (three) times daily as needed for dizziness.  oxyCODONE-acetaminophen 5-325 MG tablet Commonly known as:  PERCOCET/ROXICET Take 2 tablets by mouth every 4 (four) hours as needed for severe pain. What changed:  Another medication with the same name was added. Make sure you understand how and when to take each.   oxyCODONE-acetaminophen 5-325 MG tablet Commonly known as:  PERCOCET/ROXICET Take 1 tablet by mouth every 4 (four) hours as needed for severe pain. What changed:  You were already taking a medication with the same name, and this prescription was added. Make sure you understand how and when to take each.    pantoprazole 40 MG tablet Commonly known as:  PROTONIX Take 40 mg by mouth daily.   polyethylene glycol packet Commonly known as:  MIRALAX / GLYCOLAX Take 17 g by mouth every other day. What changed:  when to take this  reasons to take this   senna-docusate 8.6-50 MG tablet Commonly known as:  Senokot-S Take 1 tablet by mouth daily. What changed:  when to take this  reasons to take this      Contact information for after-discharge care    Destination    [facility] .   Specialty:  [place] information: [place] [phone]             Allergies  Allergen Reactions  . Ciprofloxacin Hives  . Hydrocodone Nausea And Vomiting  . Codeine Diarrhea and Nausea And Vomiting  . Doxycycline Diarrhea and Nausea And Vomiting  . Omnicef [Cefdinir] Nausea Only and Other (See Comments)    Constipation, tolerated Zosyn  . Augmentin [Amoxicillin-Pot Clavulanate] Hives and Rash    Has patient had a PCN reaction causing immediate rash, facial/tongue/throat swelling, SOB or lightheadedness with hypotension: Yes Has patient had a PCN reaction causing severe rash involving mucus membranes or skin necrosis: Yes Did a PCN reaction that required hospitalization No Did PCN reaction occurring within the last 10 years: Yes If all of the above answers are "NO", then may proceed with Cephalosporin use.  Pt states she has taken penicillin since, and was ok with it...    Consultations:  Discussed case with Dr. Oletta Lamas over phone  Procedures/Studies: Dg Chest 2 View  Result Date: 09/28/2016 CLINICAL DATA:  Acute onset of bilateral leg swelling and dizziness. Initial encounter. EXAM: CHEST  2 VIEW COMPARISON:  Chest radiograph performed 09/26/2016 FINDINGS: The lungs are well-aerated and clear. There is no evidence of focal opacification, pleural effusion or pneumothorax. The heart is normal in size; the  mediastinal contour is within normal limits. No acute osseous abnormalities are seen. IMPRESSION: No acute cardiopulmonary process seen. Electronically Signed   By: Garald Balding M.D.   On: 09/28/2016 18:21   Dg Chest 2 View  Result Date: 09/26/2016 CLINICAL DATA:  Chest pain with headache. Radiates into the right arm. EXAM: CHEST  2 VIEW COMPARISON:  09/20/2016 FINDINGS: There is no focal parenchymal opacity. There is a small right pleural effusion. The heart and mediastinal contours are unremarkable. The osseous structures are unremarkable. IMPRESSION: Small right pleural effusion. Electronically Signed   By: Kathreen Devoid   On: 09/26/2016 17:33   Dg Chest 2 View  Result Date: 09/18/2016 CLINICAL DATA:  Left abdominal pain. Bilateral lower extremity swelling. EXAM: CHEST  2 VIEW COMPARISON:  CT chest 05/12/2016.  PA and lateral chest 07/11/2016. FINDINGS: The lungs appear clear. Small right pleural effusion is identified. No pneumothorax. Heart size is normal. IMPRESSION: Small right pleural effusion.  Otherwise  negative Electronically Signed   By: Inge Rise M.D.   On: 09/18/2016 15:23   Ct Abdomen Pelvis W Contrast  Result Date: 09/18/2016 CLINICAL DATA:  LEFT-sided abdominal pain EXAM: CT ABDOMEN AND PELVIS WITH CONTRAST TECHNIQUE: Multidetector CT imaging of the abdomen and pelvis was performed using the standard protocol following bolus administration of intravenous contrast. CONTRAST:  24m ISOVUE-300 IOPAMIDOL (ISOVUE-300) INJECTION 61%, 375mISOVUE-300 IOPAMIDOL (ISOVUE-300) INJECTION 61% COMPARISON:  07/21/2016 FINDINGS: Lower chest: Lung bases are clear. Hepatobiliary: No focal hepatic lesion. Mild intra and extrahepatic biliary duct dilatation following cholecystectomy. The common bile duct measures 10 mm in diameter. Pancreas: No pancreatic duct dilatation. The pancreatic parenchyma is normal. Spleen: Normal Adrenals/urinary tract: Adrenal glands and kidneys are normal. The ureters  and bladder normal. Stomach/Bowel: Stomach, small-bowel and cecum are normal. Appendix not identified. Colon and rectosigmoid colon are normal. Vascular/Lymphatic: Abdominal aorta is normal caliber with atherosclerotic calcification. There is no retroperitoneal or periportal lymphadenopathy. No pelvic lymphadenopathy. Reproductive: Post hysterectomy anatomy. Other: No ventral hernia or inguinal hernia. Anasarca of the soft tissues of the flanks. Musculoskeletal: No aggressive osseous lesion. Mottled appearance of the bones may relate to chronic renal insufficiency IMPRESSION: 1. No clear explanation for LEFT-sided pain. No diverticulitis or obstructive uropathy. 2. Small bilateral pleural effusions and mild anasarca. 3.  Atherosclerotic calcification of the aorta. 4. Intra and extrahepatic duct dilatation likely relates to prior cholecystectomy. Recommend correlation with bilirubin levels. Electronically Signed   By: StSuzy Bouchard.D.   On: 09/18/2016 17:22   Dg Esophagus  Result Date: 10/04/2016 CLINICAL DATA:  [age]ear old female inpatient with several months of dysphagia, worsened since recent stroke. EXAM: ESOPHOGRAM/BARIUM SWALLOW TECHNIQUE: Single contrast examination was performed using  thin barium. FLUOROSCOPY TIME:  Fluoroscopy Time:  3 minutes 36 seconds Radiation Exposure Index (if provided by the fluoroscopic device): 46.6 Number of Acquired Spot Images: 3 COMPARISON:  10/02/2016 chest radiograph. FINDINGS: Limited inpatient esophagram, significantly limited by patient mobility. The oral and pharyngeal phases of swallowing appear normal, with no laryngeal penetration or tracheobronchial aspiration. There is no significant barium retention in the pharynx. No mass, stricture or diverticulum is seen in the pharynx. There is cricopharyngeus muscle dysfunction, characterized by delayed and incomplete opening of the cricopharyngeus muscle. There is moderate esophageal dysmotility, characterized by  intermittent moderate weakening of primary peristalsis in the mid to lower thoracic esophagus. No hiatal hernia. No gastroesophageal reflux was elicited on this limited scan. No gross esophageal mass or ulcer is detected. No gross esophageal stricture is visualized on these limited views. A swallowed 13 mm barium tablet became lodged at the esophagogastric junction for 1-2 minutes until additional barium was swallowed, with subsequent passage of the tablet into the stomach. The stomach is relatively collapsed. IMPRESSION: 1. Normal oral and pharyngeal phase of swallowing, with no laryngeal penetration or tracheobronchial aspiration. 2. Cricopharyngeus muscle dysfunction, characteristic of chronic gastroesophageal reflux disease. 3. Moderate esophageal dysmotility, with a pattern characteristic of chronic reflux related dysmotility. 4. Gastroesophageal reflux was not directly elicited on this limited scan. No hiatal hernia. 5. Swallowed 13 mm barium tablet became lodged temporarily at the esophagogastric junction, cannot exclude a mild peptic stricture at this location. No gross esophageal mass. Consider correlation with upper endoscopy as clinically warranted. Electronically Signed   By: JaIlona Sorrel.D.   On: 10/04/2016 11:19   Dg Chest Port 1 View  Result Date: 10/05/2016 CLINICAL DATA:  Fever EXAM: PORTABLE CHEST 1 VIEW COMPARISON:  10/02/2016 FINDINGS: Cardiomediastinal silhouette is  stable. There is trace left pleural effusion left basilar atelectasis or infiltrate. No pulmonary edema. IMPRESSION: Trace left pleural effusion with left basilar atelectasis or infiltrate. No pulmonary edema. Electronically Signed   By: Lahoma Crocker M.D.   On: 10/05/2016 20:13   Dg Chest Port 1 View  Result Date: 10/02/2016 CLINICAL DATA:  Altered mental status. Chest pain and shortness of breath. Congestive heart failure, chronic kidney disease, and asthma. EXAM: PORTABLE CHEST 1 VIEW COMPARISON:  09/28/2016 FINDINGS: The  heart size and mediastinal contours are within normal limits. Both lungs are clear. No evidence of pulmonary infiltrate, edema, or pleural effusion. Cervical spine fusion hardware again noted. IMPRESSION: No active disease. Electronically Signed   By: Earle Gell M.D.   On: 10/02/2016 14:07   Portable Chest X-ray (1 View)  Result Date: 09/20/2016 CLINICAL DATA:  Diabetic ketoacidosis.  Shortness of breath. EXAM: PORTABLE CHEST 1 VIEW COMPARISON:  09/18/2016 FINDINGS: Shallow inspiration. Heart size and pulmonary vascularity are normal. No focal airspace disease or consolidation in the lungs. No blunting of costophrenic angles. No pneumothorax. Mediastinal contours appear intact. Postoperative changes in the cervical spine. Degenerative changes in the thoracic spine and shoulders. IMPRESSION: No active disease. Electronically Signed   By: Lucienne Capers M.D.   On: 09/20/2016 19:43   Dg Abd Portable 2v  Result Date: 10/05/2016 CLINICAL DATA:  Inpatient.  Fever.  Vomiting. EXAM: PORTABLE ABDOMEN - 2 VIEW COMPARISON:  09/18/2016 CT abdomen/ pelvis. FINDINGS: No dilated small bowel loops. Retained barium is seen throughout the nondilated colon. No evidence of pneumatosis or pneumoperitoneum. Cholecystectomy clips are seen in the right upper quadrant of the abdomen. Mild thoracolumbar spondylosis. IMPRESSION: Nonobstructive bowel gas pattern. Retained barium throughout the nondilated colon. Electronically Signed   By: Ilona Sorrel M.D.   On: 10/05/2016 20:11   US Abdomen Limited Ruq  Result Date: 10/08/2016 CLINICAL DATA:  Elevated LFTs. EXAM: US ABDOMEN LIMITED - RIGHT UPPER QUADRANT COMPARISON:  CT 09/18/2016. FINDINGS: Gallbladder: Cholecystectomy. Common bile duct: Diameter: 10.4 mm. Non shadowing echogenic material noted in the common bile duct.Intrahepatic biliary ductal dilatation is noted. Liver: No focal hepatic abnormality identified. Liver echogenicity is normal. Ascites and bilateral pleural  effusions noted. IMPRESSION: 1. Cholecystectomy. 2. The common bile duct is dilated to 10.4 mm. Non shadowing echogenic material is noted within the common bile duct . Intrahepatic biliary ductal dilatation is noted. Further evaluation with MRCP can be obtained as needed. 3.  Mild ascites noted.  Bilateral pleural effusions noted. Electronically Signed   By: Marcello Moores  Register   On: 10/08/2016 11:28    Subjective: No complaints this AM  Discharge Exam: Vitals:   10/08/16 2128 10/09/16 0524  BP: (!) 182/85 (!) 195/100  Pulse: 74 79  Resp: 16 16  Temp: 98.9 F (37.2 C) 98.5 F (36.9 C)   Vitals:   10/08/16 0533 10/08/16 1315 10/08/16 2128 10/09/16 0524  BP: (!) 162/82 (!) 176/82 (!) 182/85 (!) 195/100  Pulse: 75 73 74 79  Resp: '16 16 16 16  '$ Temp: 98.7 F (37.1 C) 97.6 F (36.4 C) 98.9 F (37.2 C) 98.5 F (36.9 C)  TempSrc: Oral Oral Oral Oral  SpO2: 91% 96% 96% 94%  Weight:      Height:        General: Pt is alert, awake, not in acute distress Cardiovascular: RRR, S1/S2 +, no rubs, no gallops Respiratory: CTA bilaterally, no wheezing, no rhonchi Abdominal: Soft, NT, ND, bowel sounds + Extremities: no edema, no cyanosis  The results of significant diagnostics from this hospitalization (including imaging, microbiology, ancillary and laboratory) are listed below for reference.     Microbiology: Recent Results (from the past 240 hour(s))  MRSA PCR Screening     Status: None   Collection Time: 10/02/16  7:01 PM  Result Value Ref Range Status   MRSA by PCR NEGATIVE NEGATIVE Final    Comment:        The GeneXpert MRSA Assay (FDA approved for NASAL specimens only), is one component of a comprehensive MRSA colonization surveillance program. It is not intended to diagnose MRSA infection nor to guide or monitor treatment for MRSA infections.   Culture, Urine     Status: Abnormal   Collection Time: 10/05/16  6:41 PM  Result Value Ref Range Status   Specimen Description  URINE, RANDOM  Final   Special Requests NONE  Final   Culture >=100,000 COLONIES/mL ENTEROBACTER AEROGENES (A)  Final   Report Status 10/08/2016 FINAL  Final   Organism ID, Bacteria ENTEROBACTER AEROGENES (A)  Final      Susceptibility   Enterobacter aerogenes - MIC*    CEFAZOLIN >=64 RESISTANT Resistant     CEFTRIAXONE <=1 SENSITIVE Sensitive     CIPROFLOXACIN <=0.25 SENSITIVE Sensitive     GENTAMICIN <=1 SENSITIVE Sensitive     IMIPENEM 1 SENSITIVE Sensitive     NITROFURANTOIN 64 INTERMEDIATE Intermediate     TRIMETH/SULFA <=20 SENSITIVE Sensitive     PIP/TAZO <=4 SENSITIVE Sensitive     * >=100,000 COLONIES/mL ENTEROBACTER AEROGENES  Culture, blood (Routine X 2) w Reflex to ID Panel     Status: Abnormal   Collection Time: 10/05/16  7:08 PM  Result Value Ref Range Status   Specimen Description BLOOD LEFT ANTECUBITAL  Final   Special Requests BOTTLES DRAWN AEROBIC ONLY 5CC  Final   Culture  Setup Time   Final    GRAM NEGATIVE RODS AEROBIC BOTTLE ONLY CRITICAL RESULT CALLED TO, READ BACK BY AND VERIFIED WITH: N GLOGOVAC,PHARMD AT 1930 10/06/16 BY L BENFIELD    Culture (A)  Final    ENTEROBACTER AEROGENES SUSCEPTIBILITIES PERFORMED ON PREVIOUS CULTURE WITHIN THE LAST 5 DAYS. Performed at [facility]    Report Status 10/08/2016 FINAL  Final  Culture, blood (Routine X 2) w Reflex to ID Panel     Status: Abnormal   Collection Time: 10/05/16  7:17 PM  Result Value Ref Range Status   Specimen Description BLOOD LEFT HAND  Final   Special Requests BOTTLES DRAWN AEROBIC ONLY 5CC  Final   Culture  Setup Time   Final    GRAM NEGATIVE RODS AEROBIC BOTTLE ONLY CRITICAL RESULT CALLED TO, READ BACK BY AND VERIFIED WITH: N GLOGOVAC,PHARMD AT 1930 10/06/16 BY L BENFIELD Performed at [facility]    Culture ENTEROBACTER AEROGENES (A)  Final   Report Status 10/08/2016 FINAL  Final   Organism ID, Bacteria ENTEROBACTER AEROGENES  Final      Susceptibility   Enterobacter  aerogenes - MIC*    CEFAZOLIN >=64 RESISTANT Resistant     CEFEPIME <=1 SENSITIVE Sensitive     CEFTAZIDIME <=1 SENSITIVE Sensitive     CEFTRIAXONE <=1 SENSITIVE Sensitive     CIPROFLOXACIN <=0.25 SENSITIVE Sensitive     GENTAMICIN <=1 SENSITIVE Sensitive     IMIPENEM 1 SENSITIVE Sensitive     TRIMETH/SULFA <=20 SENSITIVE Sensitive     PIP/TAZO <=4 SENSITIVE Sensitive     * ENTEROBACTER AEROGENES  Blood Culture ID Panel (  Reflexed)     Status: Abnormal   Collection Time: 10/05/16  7:17 PM  Result Value Ref Range Status   Enterococcus species NOT DETECTED NOT DETECTED Final   Listeria monocytogenes NOT DETECTED NOT DETECTED Final   Staphylococcus species NOT DETECTED NOT DETECTED Final   Staphylococcus aureus NOT DETECTED NOT DETECTED Final   Streptococcus species NOT DETECTED NOT DETECTED Final   Streptococcus agalactiae NOT DETECTED NOT DETECTED Final   Streptococcus pneumoniae NOT DETECTED NOT DETECTED Final   Streptococcus pyogenes NOT DETECTED NOT DETECTED Final   Acinetobacter baumannii NOT DETECTED NOT DETECTED Final   Enterobacteriaceae species DETECTED (A) NOT DETECTED Final    Comment: CRITICAL RESULT CALLED TO, READ BACK BY AND VERIFIED WITH: N GLOGOVAC,PHARMD AT 1930 10/06/16 BY L BENFIELD    Enterobacter cloacae complex NOT DETECTED NOT DETECTED Final   Escherichia coli NOT DETECTED NOT DETECTED Final   Klebsiella oxytoca NOT DETECTED NOT DETECTED Final   Klebsiella pneumoniae NOT DETECTED NOT DETECTED Final   Proteus species NOT DETECTED NOT DETECTED Final   Serratia marcescens NOT DETECTED NOT DETECTED Final   Carbapenem resistance NOT DETECTED NOT DETECTED Final   Haemophilus influenzae NOT DETECTED NOT DETECTED Final   Neisseria meningitidis NOT DETECTED NOT DETECTED Final   Pseudomonas aeruginosa NOT DETECTED NOT DETECTED Final   Candida albicans NOT DETECTED NOT DETECTED Final   Candida glabrata NOT DETECTED NOT DETECTED Final   Candida krusei NOT DETECTED NOT  DETECTED Final   Candida parapsilosis NOT DETECTED NOT DETECTED Final   Candida tropicalis NOT DETECTED NOT DETECTED Final    Comment: Performed at [place]: BNP (last 3 results)  Recent Labs  09/18/16 1224 09/26/16 1652 09/28/16 1720  BNP 196.0* 179.0* 981.1*   Basic Metabolic Panel:  Recent Labs Lab 10/05/16 0415 10/06/16 0409 10/07/16 0337 10/08/16 0340 10/09/16 0355  NA 138 138 139 139 139  K 3.9 4.3 4.1 4.2 3.9  CL 113* 112* 113* 112* 114*  CO2 21* 18* 21* 21* 20*  GLUCOSE 57* 341* 148* 137* 107*  BUN 20 24* 27* 26* 20  CREATININE 1.28* 1.70* 1.90* 1.69* 1.15*  CALCIUM 6.9* 7.0* 6.9* 7.2* 7.2*   Liver Function Tests:  Recent Labs Lab 10/07/16 0337 10/08/16 0340 10/09/16 0355  AST 129* 76* 37  ALT 143* 113* 81*  ALKPHOS 119 124 122  BILITOT 0.5 0.8 0.7  PROT 4.2* 4.6* 4.5*  ALBUMIN 1.6* 1.8* 1.8*   No results for input(s): LIPASE, AMYLASE in the last 168 hours. No results for input(s): AMMONIA in the last 168 hours. CBC:  Recent Labs Lab 10/05/16 0415 10/06/16 0409 10/07/16 0337 10/08/16 0340 10/09/16 0355  WBC 5.0 13.2* 13.3* 11.0* 7.5  HGB 7.8* 7.3* 6.7* 9.7* 10.4*  HCT 23.5* 22.2* 20.0* 29.0* 30.0*  MCV 90.7 92.9 90.5 89.2 88.8  PLT 208 96* 50* 77* 99*   Cardiac Enzymes:  Recent Labs Lab 10/02/16 1446  TROPONINI <0.03   BNP: Invalid input(s): POCBNP CBG:  Recent Labs Lab 10/08/16 1129 10/08/16 1656 10/08/16 2133 10/09/16 0820 10/09/16 1149  GLUCAP 157* 87 90 152* 136*   D-Dimer No results for input(s): DDIMER in the last 72 hours. Hgb A1c No results for input(s): HGBA1C in the last 72 hours. Lipid Profile No results for input(s): CHOL, HDL, LDLCALC, TRIG, CHOLHDL, LDLDIRECT in the last 72 hours. Thyroid function studies No results for input(s): TSH, T4TOTAL, T3FREE, THYROIDAB in the last 72 hours.  Invalid input(s): FREET3  Anemia work up  Recent Labs  10/08/16 0340  VITAMINB12 1,118*    Urinalysis    Component Value Date/Time   COLORURINE AMBER (A) 10/05/2016 1821   APPEARANCEUR TURBID (A) 10/05/2016 1821   APPEARANCEUR Clear 06/15/2014 1508   LABSPEC 1.020 10/05/2016 1821   LABSPEC 1.010 06/15/2014 1508   PHURINE 6.0 10/05/2016 1821   GLUCOSEU 250 (A) 10/05/2016 1821   GLUCOSEU Negative 06/15/2014 1508   HGBUR MODERATE (A) 10/05/2016 1821   BILIRUBINUR SMALL (A) 10/05/2016 1821   BILIRUBINUR Negative 06/15/2014 [place] 10/05/2016 1821   PROTEINUR >300 (A) 10/05/2016 1821   UROBILINOGEN 0.2 09/14/2015 1617   NITRITE POSITIVE (A) 10/05/2016 1821   LEUKOCYTESUR LARGE (A) 10/05/2016 1821   LEUKOCYTESUR Negative 06/15/2014 1508   Sepsis Labs Invalid input(s): PROCALCITONIN,  WBC,  LACTICIDVEN Microbiology Recent Results (from the past 240 hour(s))  MRSA PCR Screening     Status: None   Collection Time: 10/02/16  7:01 PM  Result Value Ref Range Status   MRSA by PCR NEGATIVE NEGATIVE Final    Comment:        The GeneXpert MRSA Assay (FDA approved for NASAL specimens only), is one component of a comprehensive MRSA colonization surveillance program. It is not intended to diagnose MRSA infection nor to guide or monitor treatment for MRSA infections.   Culture, Urine     Status: Abnormal   Collection Time: 10/05/16  6:41 PM  Result Value Ref Range Status   Specimen Description URINE, RANDOM  Final   Special Requests NONE  Final   Culture >=100,000 COLONIES/mL ENTEROBACTER AEROGENES (A)  Final   Report Status 10/08/2016 FINAL  Final   Organism ID, Bacteria ENTEROBACTER AEROGENES (A)  Final      Susceptibility   Enterobacter aerogenes - MIC*    CEFAZOLIN >=64 RESISTANT Resistant     CEFTRIAXONE <=1 SENSITIVE Sensitive     CIPROFLOXACIN <=0.25 SENSITIVE Sensitive     GENTAMICIN <=1 SENSITIVE Sensitive     IMIPENEM 1 SENSITIVE Sensitive     NITROFURANTOIN 64 INTERMEDIATE Intermediate     TRIMETH/SULFA <=20 SENSITIVE Sensitive      PIP/TAZO <=4 SENSITIVE Sensitive     * >=100,000 COLONIES/mL ENTEROBACTER AEROGENES  Culture, blood (Routine X 2) w Reflex to ID Panel     Status: Abnormal   Collection Time: 10/05/16  7:08 PM  Result Value Ref Range Status   Specimen Description BLOOD LEFT ANTECUBITAL  Final   Special Requests BOTTLES DRAWN AEROBIC ONLY 5CC  Final   Culture  Setup Time   Final    GRAM NEGATIVE RODS AEROBIC BOTTLE ONLY CRITICAL RESULT CALLED TO, READ BACK BY AND VERIFIED WITH: N GLOGOVAC,PHARMD AT 1930 10/06/16 BY L BENFIELD    Culture (A)  Final    ENTEROBACTER AEROGENES SUSCEPTIBILITIES PERFORMED ON PREVIOUS CULTURE WITHIN THE LAST 5 DAYS. Performed at [facility]    Report Status 10/08/2016 FINAL  Final  Culture, blood (Routine X 2) w Reflex to ID Panel     Status: Abnormal   Collection Time: 10/05/16  7:17 PM  Result Value Ref Range Status   Specimen Description BLOOD LEFT HAND  Final   Special Requests BOTTLES DRAWN AEROBIC ONLY 5CC  Final   Culture  Setup Time   Final    GRAM NEGATIVE RODS AEROBIC BOTTLE ONLY CRITICAL RESULT CALLED TO, READ BACK BY AND VERIFIED WITH: N GLOGOVAC,PHARMD AT 1930 10/06/16 BY L BENFIELD Performed at [facility]  Culture ENTEROBACTER AEROGENES (A)  Final   Report Status 10/08/2016 FINAL  Final   Organism ID, Bacteria ENTEROBACTER AEROGENES  Final      Susceptibility   Enterobacter aerogenes - MIC*    CEFAZOLIN >=64 RESISTANT Resistant     CEFEPIME <=1 SENSITIVE Sensitive     CEFTAZIDIME <=1 SENSITIVE Sensitive     CEFTRIAXONE <=1 SENSITIVE Sensitive     CIPROFLOXACIN <=0.25 SENSITIVE Sensitive     GENTAMICIN <=1 SENSITIVE Sensitive     IMIPENEM 1 SENSITIVE Sensitive     TRIMETH/SULFA <=20 SENSITIVE Sensitive     PIP/TAZO <=4 SENSITIVE Sensitive     * ENTEROBACTER AEROGENES  Blood Culture ID Panel (Reflexed)     Status: Abnormal   Collection Time: 10/05/16  7:17 PM  Result Value Ref Range Status   Enterococcus species NOT DETECTED NOT  DETECTED Final   Listeria monocytogenes NOT DETECTED NOT DETECTED Final   Staphylococcus species NOT DETECTED NOT DETECTED Final   Staphylococcus aureus NOT DETECTED NOT DETECTED Final   Streptococcus species NOT DETECTED NOT DETECTED Final   Streptococcus agalactiae NOT DETECTED NOT DETECTED Final   Streptococcus pneumoniae NOT DETECTED NOT DETECTED Final   Streptococcus pyogenes NOT DETECTED NOT DETECTED Final   Acinetobacter baumannii NOT DETECTED NOT DETECTED Final   Enterobacteriaceae species DETECTED (A) NOT DETECTED Final    Comment: CRITICAL RESULT CALLED TO, READ BACK BY AND VERIFIED WITH: N GLOGOVAC,PHARMD AT 1930 10/06/16 BY L BENFIELD    Enterobacter cloacae complex NOT DETECTED NOT DETECTED Final   Escherichia coli NOT DETECTED NOT DETECTED Final   Klebsiella oxytoca NOT DETECTED NOT DETECTED Final   Klebsiella pneumoniae NOT DETECTED NOT DETECTED Final   Proteus species NOT DETECTED NOT DETECTED Final   Serratia marcescens NOT DETECTED NOT DETECTED Final   Carbapenem resistance NOT DETECTED NOT DETECTED Final   Haemophilus influenzae NOT DETECTED NOT DETECTED Final   Neisseria meningitidis NOT DETECTED NOT DETECTED Final   Pseudomonas aeruginosa NOT DETECTED NOT DETECTED Final   Candida albicans NOT DETECTED NOT DETECTED Final   Candida glabrata NOT DETECTED NOT DETECTED Final   Candida krusei NOT DETECTED NOT DETECTED Final   Candida parapsilosis NOT DETECTED NOT DETECTED Final   Candida tropicalis NOT DETECTED NOT DETECTED Final    Comment: Performed at [facility]     SIGNED:   Donne Hazel, MD  [facility] 10/09/2016, 2:37 PM  If 7PM-7AM, please contact night-coverage [email or web address] Password [email or web address]

## 2016-10-09 NOTE — Progress Notes (Addendum)
Physical Therapy Treatment Patient Details Name: Cassandra [name] MRN: [id number] DOB: [birth year] Today's Date: 10/09/2016    History of Present Illness 71 y.o. female admitted 10/02/16 with abdominal pain, diarrhea. Dx of DKA.  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 Pt reports h/o orthostatic hypotension.     PT Comments    Min/guard assist to pivot to recliner with RW, activity limited by dizziness (pt reports this is chronic) and nausea.   Follow Up Recommendations  SNF (supervision for mobility)     Equipment Recommendations  None recommended by PT    Recommendations for Other Services OT consult     Precautions / Restrictions Precautions Precautions: Fall Precaution Comments: 4 falls in past 1 year (pt reports chronic dizziness in the mornings, stated she has h/o orthostatic hypotension) Restrictions Weight Bearing Restrictions: No    Mobility  Bed Mobility               General bed mobility comments: NT- up on EOB  Transfers Overall transfer level: Needs assistance Equipment used: Rolling walker (2 wheeled) Transfers: Sit to/from Stand Sit to Stand: Min guard         General transfer comment: VCs for hand placement, min guard for safety  Ambulation/Gait Ambulation/Gait assistance: Min guard Ambulation Distance (Feet): 3 Feet Assistive device: Rolling walker (2 wheeled) Gait Pattern/deviations: Step-to pattern;Decreased step length - right;Decreased step length - left   Gait velocity interpretation: at or above normal speed for age/gender General Gait Details: distance limited by dizziness in standing (pt reports this is chronic), min/guard safety 2* dizziness and h/o falls   Stairs            Wheelchair Mobility    Modified Rankin (Stroke Patients Only)       Balance     Sitting balance-Leahy Scale: Good       Standing  balance-Leahy Scale: Fair                      Cognition Arousal/Alertness: Awake/alert Behavior During Therapy: Flat affect Overall Cognitive Status: Within Functional Limits for tasks assessed                      Exercises      General Comments        Pertinent Vitals/Pain Pain Assessment: No/denies pain    Home Living                      Prior Function            PT Goals (current goals can now be found in the care plan section) Acute Rehab PT Goals Patient Stated Goal: DC to SNF PT Goal Formulation: With patient Time For Goal Achievement: 10/15/16 Potential to Achieve Goals: Fair Progress towards PT goals: Progressing toward goals    Frequency    Min 3X/week      PT Plan Current plan remains appropriate    Co-evaluation             End of Session Equipment Utilized During Treatment: Gait belt Activity Tolerance: Other (comment) (limited by dizziness, nausea) Patient left: with call bell/phone within reach;in chair;with chair alarm set     Time: 1030-1047 PT Time Calculation (min) (ACUTE ONLY): 17 min  Charges:  $Therapeutic Activity: 8-22 mins  G Codes:      Tamala SerUhlenberg, Azelia Reiger Kistler 10/09/2016, 10:55 AM [phone]

## 2016-10-09 NOTE — Progress Notes (Signed)
Nursing Discharge Summary  Patient ID: Cassandra [name] MRN: [id number] DOB/AGE: August 21, 1946 70 y.o.  Admit date: 10/02/2016 Discharge date: 10/09/2016  Discharged Condition: good  Disposition: 01-Home or Self Care  Contact information for after-discharge care    Destination    [facility] .   Specialty:  Skilled Nursing Facility Contact information: [address] [place] [phone]              Prescriptions Given: Packet given to husband that he is supposed to give to facility when he arrives.  [facility] [place] [facility].    Means of Discharge: Patient taken downstairs via wheelchair to be transported to [facility] via private vehicle.    Signed: Gloriajean DellBaldwin, Gustavia Carie Danielle 10/09/2016, 3:25 PM

## 2016-10-10 ENCOUNTER — Other Ambulatory Visit
Admission: RE | Admit: 2016-10-10 | Discharge: 2016-10-10 | Disposition: A | Discharge status: Home / Self Care | Payer: Medicare Other | Source: Ambulatory Visit | Attending: Family Medicine | Admitting: Family Medicine

## 2016-10-10 DIAGNOSIS — R05 Cough: Secondary | ICD-10-CM | POA: Diagnosis present

## 2016-10-12 LAB — LEGIONELLA PNEUMOPHILA SEROGP 1 UR AG: L. pneumophila Serogp 1 Ur Ag: NEGATIVE

## 2016-10-15 DIAGNOSIS — Y92129 Unspecified place in nursing home as the place of occurrence of the external cause: Secondary | ICD-10-CM

## 2016-10-15 DIAGNOSIS — W19XXXA Unspecified fall, initial encounter: Secondary | ICD-10-CM

## 2016-10-15 HISTORY — DX: Unspecified place in nursing home as the place of occurrence of the external cause: Y92.129

## 2016-10-15 HISTORY — DX: Unspecified fall, initial encounter: W19.XXXA

## 2016-10-16 ENCOUNTER — Emergency Department (HOSPITAL_COMMUNITY): Payer: Medicare Other

## 2016-10-16 ENCOUNTER — Inpatient Hospital Stay (HOSPITAL_COMMUNITY)
Admission: EM | Admit: 2016-10-16 | Discharge: 2016-10-23 | DRG: 371 | Disposition: A | Discharge status: Skilled Nursing Facility | Payer: Medicare Other | Attending: Internal Medicine | Admitting: Internal Medicine

## 2016-10-16 ENCOUNTER — Encounter (HOSPITAL_COMMUNITY): Payer: Self-pay

## 2016-10-16 DIAGNOSIS — Z515 Encounter for palliative care: Secondary | ICD-10-CM

## 2016-10-16 DIAGNOSIS — S12500A Unspecified displaced fracture of sixth cervical vertebra, initial encounter for closed fracture: Secondary | ICD-10-CM | POA: Diagnosis present

## 2016-10-16 DIAGNOSIS — A0472 Enterocolitis due to Clostridium difficile, not specified as recurrent: Secondary | ICD-10-CM | POA: Diagnosis not present

## 2016-10-16 DIAGNOSIS — E860 Dehydration: Secondary | ICD-10-CM | POA: Diagnosis present

## 2016-10-16 DIAGNOSIS — Z7982 Long term (current) use of aspirin: Secondary | ICD-10-CM

## 2016-10-16 DIAGNOSIS — E785 Hyperlipidemia, unspecified: Secondary | ICD-10-CM | POA: Diagnosis present

## 2016-10-16 DIAGNOSIS — E1165 Type 2 diabetes mellitus with hyperglycemia: Secondary | ICD-10-CM | POA: Diagnosis present

## 2016-10-16 DIAGNOSIS — R5381 Other malaise: Secondary | ICD-10-CM | POA: Diagnosis present

## 2016-10-16 DIAGNOSIS — E43 Unspecified severe protein-calorie malnutrition: Secondary | ICD-10-CM | POA: Diagnosis present

## 2016-10-16 DIAGNOSIS — Z7902 Long term (current) use of antithrombotics/antiplatelets: Secondary | ICD-10-CM

## 2016-10-16 DIAGNOSIS — S22019A Unspecified fracture of first thoracic vertebra, initial encounter for closed fracture: Secondary | ICD-10-CM | POA: Diagnosis present

## 2016-10-16 DIAGNOSIS — Z7189 Other specified counseling: Secondary | ICD-10-CM

## 2016-10-16 DIAGNOSIS — D638 Anemia in other chronic diseases classified elsewhere: Secondary | ICD-10-CM | POA: Diagnosis present

## 2016-10-16 DIAGNOSIS — S22029A Unspecified fracture of second thoracic vertebra, initial encounter for closed fracture: Secondary | ICD-10-CM | POA: Diagnosis present

## 2016-10-16 DIAGNOSIS — H8110 Benign paroxysmal vertigo, unspecified ear: Secondary | ICD-10-CM | POA: Diagnosis present

## 2016-10-16 DIAGNOSIS — E11649 Type 2 diabetes mellitus with hypoglycemia without coma: Secondary | ICD-10-CM | POA: Diagnosis not present

## 2016-10-16 DIAGNOSIS — IMO0002 Reserved for concepts with insufficient information to code with codable children: Secondary | ICD-10-CM

## 2016-10-16 DIAGNOSIS — S22009A Unspecified fracture of unspecified thoracic vertebra, initial encounter for closed fracture: Secondary | ICD-10-CM | POA: Diagnosis present

## 2016-10-16 DIAGNOSIS — E039 Hypothyroidism, unspecified: Secondary | ICD-10-CM | POA: Diagnosis present

## 2016-10-16 DIAGNOSIS — I5189 Other ill-defined heart diseases: Secondary | ICD-10-CM | POA: Diagnosis present

## 2016-10-16 DIAGNOSIS — Z66 Do not resuscitate: Secondary | ICD-10-CM | POA: Diagnosis present

## 2016-10-16 DIAGNOSIS — I129 Hypertensive chronic kidney disease with stage 1 through stage 4 chronic kidney disease, or unspecified chronic kidney disease: Secondary | ICD-10-CM | POA: Diagnosis present

## 2016-10-16 DIAGNOSIS — Z79899 Other long term (current) drug therapy: Secondary | ICD-10-CM | POA: Diagnosis not present

## 2016-10-16 DIAGNOSIS — W19XXXA Unspecified fall, initial encounter: Secondary | ICD-10-CM | POA: Diagnosis present

## 2016-10-16 DIAGNOSIS — N183 Chronic kidney disease, stage 3 (moderate): Secondary | ICD-10-CM | POA: Diagnosis present

## 2016-10-16 DIAGNOSIS — R531 Weakness: Secondary | ICD-10-CM | POA: Diagnosis present

## 2016-10-16 DIAGNOSIS — E1122 Type 2 diabetes mellitus with diabetic chronic kidney disease: Secondary | ICD-10-CM | POA: Diagnosis present

## 2016-10-16 DIAGNOSIS — I10 Essential (primary) hypertension: Secondary | ICD-10-CM | POA: Diagnosis present

## 2016-10-16 DIAGNOSIS — Y92129 Unspecified place in nursing home as the place of occurrence of the external cause: Secondary | ICD-10-CM

## 2016-10-16 DIAGNOSIS — S12691A Other nondisplaced fracture of seventh cervical vertebra, initial encounter for closed fracture: Secondary | ICD-10-CM

## 2016-10-16 DIAGNOSIS — F419 Anxiety disorder, unspecified: Secondary | ICD-10-CM | POA: Diagnosis present

## 2016-10-16 DIAGNOSIS — Z794 Long term (current) use of insulin: Secondary | ICD-10-CM

## 2016-10-16 DIAGNOSIS — N179 Acute kidney failure, unspecified: Secondary | ICD-10-CM | POA: Diagnosis present

## 2016-10-16 DIAGNOSIS — I519 Heart disease, unspecified: Secondary | ICD-10-CM | POA: Diagnosis present

## 2016-10-16 DIAGNOSIS — R7881 Bacteremia: Secondary | ICD-10-CM | POA: Diagnosis present

## 2016-10-16 DIAGNOSIS — F17210 Nicotine dependence, cigarettes, uncomplicated: Secondary | ICD-10-CM | POA: Diagnosis present

## 2016-10-16 DIAGNOSIS — Z6824 Body mass index (BMI) 24.0-24.9, adult: Secondary | ICD-10-CM

## 2016-10-16 DIAGNOSIS — K838 Other specified diseases of biliary tract: Secondary | ICD-10-CM

## 2016-10-16 DIAGNOSIS — S12600A Unspecified displaced fracture of seventh cervical vertebra, initial encounter for closed fracture: Secondary | ICD-10-CM | POA: Diagnosis present

## 2016-10-16 DIAGNOSIS — R197 Diarrhea, unspecified: Secondary | ICD-10-CM | POA: Diagnosis present

## 2016-10-16 DIAGNOSIS — Z8673 Personal history of transient ischemic attack (TIA), and cerebral infarction without residual deficits: Secondary | ICD-10-CM | POA: Diagnosis not present

## 2016-10-16 DIAGNOSIS — R739 Hyperglycemia, unspecified: Secondary | ICD-10-CM

## 2016-10-16 DIAGNOSIS — M6281 Muscle weakness (generalized): Secondary | ICD-10-CM

## 2016-10-16 DIAGNOSIS — N390 Urinary tract infection, site not specified: Secondary | ICD-10-CM | POA: Diagnosis present

## 2016-10-16 DIAGNOSIS — S129XXA Fracture of neck, unspecified, initial encounter: Secondary | ICD-10-CM | POA: Diagnosis present

## 2016-10-16 HISTORY — DX: Personal history of other medical treatment: Z92.89

## 2016-10-16 HISTORY — DX: Unspecified osteoarthritis, unspecified site: M19.90

## 2016-10-16 HISTORY — DX: Unspecified place in nursing home as the place of occurrence of the external cause: Y92.129

## 2016-10-16 HISTORY — DX: Anemia, unspecified: D64.9

## 2016-10-16 HISTORY — DX: Chronic obstructive pulmonary disease, unspecified: J44.9

## 2016-10-16 HISTORY — DX: Unspecified fall, initial encounter: W19.XXXA

## 2016-10-16 HISTORY — DX: Pure hypercholesterolemia, unspecified: E78.00

## 2016-10-16 LAB — HEPATIC FUNCTION PANEL
ALBUMIN: 2.4 g/dL — AB (ref 3.5–5.0)
ALT: 39 U/L (ref 14–54)
AST: 33 U/L (ref 15–41)
Alkaline Phosphatase: 154 U/L — ABNORMAL HIGH (ref 38–126)
Bilirubin, Direct: <0.1 mg/dL — ABNORMAL LOW (ref 0.1–0.5)
TOTAL PROTEIN: 6 g/dL — AB (ref 6.5–8.1)
Total Bilirubin: 0.7 mg/dL (ref 0.3–1.2)

## 2016-10-16 LAB — URINALYSIS, ROUTINE W REFLEX MICROSCOPIC
Bilirubin Urine: NEGATIVE
Glucose, UA: >1000 mg/dL — AB
Ketones, ur: 15 mg/dL — AB
LEUKOCYTES UA: NEGATIVE
NITRITE: NEGATIVE
SPECIFIC GRAVITY, URINE: 1.016 (ref 1.005–1.030)
pH: 6 (ref 5.0–8.0)

## 2016-10-16 LAB — DIFFERENTIAL
Basophils Absolute: 0 10*3/uL (ref 0.0–0.1)
Basophils Relative: 0 %
EOS PCT: 0 %
Eosinophils Absolute: 0 10*3/uL (ref 0.0–0.7)
LYMPHS ABS: 1.2 10*3/uL (ref 0.7–4.0)
Lymphocytes Relative: 8 %
MONOS PCT: 3 %
Monocytes Absolute: 0.5 10*3/uL (ref 0.1–1.0)
NEUTROS ABS: 13.5 10*3/uL — AB (ref 1.7–7.7)
Neutrophils Relative %: 89 %

## 2016-10-16 LAB — BASIC METABOLIC PANEL
ANION GAP: 12 (ref 5–15)
BUN: 14 mg/dL (ref 6–20)
CHLORIDE: 103 mmol/L (ref 101–111)
CO2: 22 mmol/L (ref 22–32)
Calcium: 8.8 mg/dL — ABNORMAL LOW (ref 8.9–10.3)
Creatinine, Ser: 1.38 mg/dL — ABNORMAL HIGH (ref 0.44–1.00)
GFR calc Af Amer: 44 mL/min — ABNORMAL LOW (ref >60)
GFR calc non Af Amer: 38 mL/min — ABNORMAL LOW (ref >60)
Glucose, Bld: 376 mg/dL — ABNORMAL HIGH (ref 65–99)
POTASSIUM: 4.6 mmol/L (ref 3.5–5.1)
SODIUM: 137 mmol/L (ref 135–145)

## 2016-10-16 LAB — CBC
HEMATOCRIT: 40.6 % (ref 36.0–46.0)
HEMOGLOBIN: 13.8 g/dL (ref 12.0–15.0)
MCH: 30.4 pg (ref 26.0–34.0)
MCHC: 34 g/dL (ref 30.0–36.0)
MCV: 89.4 fL (ref 78.0–100.0)
Platelets: 340 10*3/uL (ref 150–400)
RBC: 4.54 MIL/uL (ref 3.87–5.11)
RDW: 14 % (ref 11.5–15.5)
WBC: 15.2 10*3/uL — AB (ref 4.0–10.5)

## 2016-10-16 LAB — URINE MICROSCOPIC-ADD ON

## 2016-10-16 LAB — TSH: TSH: 135.864 u[IU]/mL — ABNORMAL HIGH (ref 0.350–4.500)

## 2016-10-16 LAB — I-STAT TROPONIN, ED: Troponin i, poc: 0 ng/mL (ref 0.00–0.08)

## 2016-10-16 LAB — POC OCCULT BLOOD, ED: Fecal Occult Bld: NEGATIVE

## 2016-10-16 LAB — TYPE AND SCREEN
ABO/RH(D): A POS
ANTIBODY SCREEN: NEGATIVE

## 2016-10-16 LAB — CBG MONITORING, ED
GLUCOSE-CAPILLARY: 527 mg/dL — AB (ref 65–99)
Glucose-Capillary: 369 mg/dL — ABNORMAL HIGH (ref 65–99)

## 2016-10-16 LAB — T4, FREE: FREE T4: 1.18 ng/dL — AB (ref 0.61–1.12)

## 2016-10-16 LAB — BRAIN NATRIURETIC PEPTIDE: B NATRIURETIC PEPTIDE 5: 277.7 pg/mL — AB (ref 0.0–100.0)

## 2016-10-16 MED ORDER — PANTOPRAZOLE SODIUM 40 MG PO TBEC
40.0000 mg | DELAYED_RELEASE_TABLET | Freq: Every day | ORAL | Status: DC
  Administered 2016-10-16: 40 mg via ORAL
  Filled 2016-10-16: qty 1

## 2016-10-16 MED ORDER — LORAZEPAM 0.5 MG PO TABS
0.5000 mg | ORAL_TABLET | Freq: Three times a day (TID) | ORAL | Status: DC | PRN
  Administered 2016-10-17 – 2016-10-23 (×2): 0.5 mg via ORAL
  Filled 2016-10-16 (×2): qty 1

## 2016-10-16 MED ORDER — LEVOTHYROXINE SODIUM 100 MCG PO TABS
100.0000 ug | ORAL_TABLET | Freq: Every day | ORAL | Status: DC
Start: 2016-10-17 — End: 2016-10-17
  Filled 2016-10-16: qty 1

## 2016-10-16 MED ORDER — OXYCODONE-ACETAMINOPHEN 5-325 MG PO TABS
1.0000 | ORAL_TABLET | Freq: Once | ORAL | Status: AC
  Administered 2016-10-16: 1 via ORAL
  Filled 2016-10-16: qty 1

## 2016-10-16 MED ORDER — MECLIZINE HCL 25 MG PO TABS
25.0000 mg | ORAL_TABLET | Freq: Three times a day (TID) | ORAL | Status: DC | PRN
  Administered 2016-10-16: 25 mg via ORAL
  Filled 2016-10-16: qty 1

## 2016-10-16 MED ORDER — INSULIN ASPART 100 UNIT/ML ~~LOC~~ SOLN
10.0000 [IU] | Freq: Once | SUBCUTANEOUS | Status: DC
  Filled 2016-10-16: qty 1

## 2016-10-16 MED ORDER — INSULIN GLARGINE 100 UNIT/ML ~~LOC~~ SOLN
8.0000 [IU] | Freq: Every day | SUBCUTANEOUS | Status: DC
  Administered 2016-10-16 – 2016-10-18 (×2): 8 [IU] via SUBCUTANEOUS
  Filled 2016-10-16 (×3): qty 0.08

## 2016-10-16 MED ORDER — CEFPODOXIME PROXETIL 200 MG PO TABS
200.0000 mg | ORAL_TABLET | Freq: Two times a day (BID) | ORAL | Status: DC
  Administered 2016-10-16: 200 mg via ORAL
  Filled 2016-10-16 (×2): qty 1

## 2016-10-16 MED ORDER — SODIUM CHLORIDE 0.9 % IV BOLUS (SEPSIS)
1000.0000 mL | Freq: Once | INTRAVENOUS | Status: AC
  Administered 2016-10-16: 1000 mL via INTRAVENOUS

## 2016-10-16 MED ORDER — SIMETHICONE 80 MG PO CHEW
80.0000 mg | CHEWABLE_TABLET | Freq: Once | ORAL | Status: AC
  Administered 2016-10-16: 80 mg via ORAL
  Filled 2016-10-16: qty 1

## 2016-10-16 MED ORDER — CLOPIDOGREL BISULFATE 75 MG PO TABS
75.0000 mg | ORAL_TABLET | Freq: Every day | ORAL | Status: DC
  Administered 2016-10-16 – 2016-10-23 (×8): 75 mg via ORAL
  Filled 2016-10-16 (×8): qty 1

## 2016-10-16 MED ORDER — CARVEDILOL 3.125 MG PO TABS
3.1250 mg | ORAL_TABLET | Freq: Two times a day (BID) | ORAL | Status: DC
  Administered 2016-10-17 – 2016-10-23 (×11): 3.125 mg via ORAL
  Filled 2016-10-16 (×13): qty 1

## 2016-10-16 MED ORDER — INSULIN ASPART 100 UNIT/ML ~~LOC~~ SOLN
5.0000 [IU] | Freq: Three times a day (TID) | SUBCUTANEOUS | Status: DC
  Administered 2016-10-16: 5 [IU] via SUBCUTANEOUS

## 2016-10-16 MED ORDER — ATORVASTATIN CALCIUM 10 MG PO TABS: 10.0000 mg | ORAL_TABLET | Freq: Every day | ORAL | Status: DC

## 2016-10-16 MED ORDER — AMLODIPINE BESYLATE 5 MG PO TABS
5.0000 mg | ORAL_TABLET | Freq: Every day | ORAL | Status: DC
  Administered 2016-10-17 – 2016-10-23 (×7): 5 mg via ORAL
  Filled 2016-10-16 (×7): qty 1

## 2016-10-16 MED ORDER — OXYCODONE-ACETAMINOPHEN 5-325 MG PO TABS: 2.0000 | ORAL_TABLET | ORAL | Status: DC | PRN

## 2016-10-16 MED ORDER — SENNOSIDES-DOCUSATE SODIUM 8.6-50 MG PO TABS
1.0000 | ORAL_TABLET | Freq: Every evening | ORAL | Status: DC | PRN
  Filled 2016-10-16: qty 1

## 2016-10-16 MED ORDER — LOSARTAN POTASSIUM 25 MG PO TABS
25.0000 mg | ORAL_TABLET | Freq: Every day | ORAL | Status: DC
  Administered 2016-10-16: 25 mg via ORAL
  Filled 2016-10-16: qty 1

## 2016-10-16 MED ORDER — HYDROCHLOROTHIAZIDE 25 MG PO TABS
12.5000 mg | ORAL_TABLET | Freq: Every day | ORAL | Status: DC
  Administered 2016-10-16: 12.5 mg via ORAL
  Filled 2016-10-16: qty 1

## 2016-10-16 MED ORDER — POLYSACCHARIDE IRON COMPLEX 150 MG PO CAPS
150.0000 mg | ORAL_CAPSULE | Freq: Every day | ORAL | Status: DC
Start: 2016-10-16 — End: 2016-10-17
  Administered 2016-10-16: 150 mg via ORAL
  Filled 2016-10-16 (×2): qty 1

## 2016-10-16 MED ORDER — OXYCODONE-ACETAMINOPHEN 5-325 MG PO TABS: 1.0000 | ORAL_TABLET | ORAL | Status: DC | PRN

## 2016-10-16 MED ORDER — ESCITALOPRAM OXALATE 20 MG PO TABS
20.0000 mg | ORAL_TABLET | Freq: Every day | ORAL | Status: DC
  Administered 2016-10-16 – 2016-10-20 (×4): 20 mg via ORAL
  Filled 2016-10-16 (×2): qty 1
  Filled 2016-10-16: qty 2
  Filled 2016-10-16 (×2): qty 1

## 2016-10-16 MED ORDER — ASPIRIN 81 MG PO CHEW
81.0000 mg | CHEWABLE_TABLET | Freq: Every day | ORAL | Status: DC
  Administered 2016-10-16 – 2016-10-23 (×8): 81 mg via ORAL
  Filled 2016-10-16 (×8): qty 1

## 2016-10-16 MED ORDER — POLYETHYLENE GLYCOL 3350 17 G PO PACK: 17.0000 g | PACK | Freq: Every day | ORAL | Status: DC | PRN

## 2016-10-16 NOTE — ED Notes (Signed)
Patient eating with granddaughter

## 2016-10-16 NOTE — Progress Notes (Addendum)
CSW contacted [facility] regarding Patient returning as RN Case Manager reports Patient is agreeable to return. Per facility, Patient left AMA and is unable to return. CSW to seek SNF placement pending PT evaluation.      Lance MussAshley Gardner,MSW, LCSW [facility] ED/57M Clinical Social Worker [phone]

## 2016-10-16 NOTE — ED Provider Notes (Signed)
Patient was seen by social worker today. Patient should be placed in a nursing home tomorrow. Social worker stated that patient should be relatively easy placement tomorrow   Bethann BerkshireJoseph Monifah Freehling, MD 10/16/16 2009

## 2016-10-16 NOTE — ED Notes (Signed)
Pt's CBG result was 537. Informed Katie - RN.

## 2016-10-16 NOTE — Progress Notes (Signed)
PT Cancellation Note  Patient Details Name: Cassandra [name] MRN: [id number] DOB: 08-15-1946   Cancelled Treatment:    Reason Eval/Treat Not Completed: Medical issues which prohibited therapy Per CT report, pt with new acute anterior fractures C6, C7, and T2.  She is awaiting neurosurgical consult.  Will defer OT/PT evals until cleared for activity by neurosurgery.   Marcene BrawnChadwell, Cassandra [name] 10/16/2016, 3:00 PM   Lewis ShockAshly Elleni [name], PT, DPT Pager #: [phone] Office #: [phone]

## 2016-10-16 NOTE — ED Triage Notes (Addendum)
Chest tightness and increased swelling in extremities about 3 days ago and got worse yesterday. NO SOB, clear lung sounds. Hy. Of Heart failure diabetes (CBG 387 with EMS). Dark stools for the past few weeks. Hypertensive 180/90 with EMS. Pt. Also fell yesterday leaving the facility and hit her head

## 2016-10-16 NOTE — [place] FL2 (Signed)
[place] MEDICAID FL2 LEVEL OF CARE SCREENING TOOL     IDENTIFICATION  Patient Name: Cassandra AbbeBillie A Aderman Birthdate: 02-Feb-1946 Sex: female Admission Date (Current Location): 10/16/2016  [facility] [place]   Facility and Address:  [facility], [address], [place], [place] [address]      Provider Number: [id number]  Attending Physician Name and Address:  Lavera Guiseana Duo Liu, MD  Relative Name and Phone Number:  Maudie MercuryCharles Desautel (Spouse) [phone]    Current Level of Care: Hospital Recommended Level of Care: Skilled Nursing Facility Prior Approval Number:    Date Approved/Denied:   PASRR Number: [id number]  Discharge Plan: SNF    Current Diagnoses: Patient Active Problem List   Diagnosis Date Noted  . Gram-negative bacteremia 10/07/2016  . Thrombocytopenia (HCC) 10/07/2016  . Arterial hypotension   . Hyperkalemia   . Non-intractable vomiting   . Gastroesophageal reflux disease   . H/O: CVA (cerebrovascular accident)   . DKA, type 1 (HCC) 09/06/2016  . Hypoglycemia associated with diabetes (HCC)   . Orthostatic hypotension   . Protein-calorie malnutrition, severe 08/28/2016  . UTI (lower urinary tract infection) 07/19/2016  . AKI (acute kidney injury) (HCC) 07/19/2016  . Chronic diastolic heart failure (HCC) 07/19/2016  . Dehydration   . Cerebral embolism with cerebral infarction 07/13/2016  . Acute diastolic CHF (congestive heart failure) (HCC) 07/11/2016  . Cerebrovascular accident (CVA) (HCC)   . Essential hypertension   . HLD (hyperlipidemia)   . Anemia of chronic disease   . Physical deconditioning   . Acute renal failure superimposed on stage 3 chronic kidney disease (HCC)   . Diabetic ketoacidosis with coma associated with type 1 diabetes mellitus (HCC)   . History of ETT   . Stroke (HCC) 05/16/2016  . Cerebral thrombosis with cerebral infarction 05/15/2016  . Acute on chronic renal failure (HCC)   .  Generalized anxiety disorder 05/10/2016  . Diabetic ketoacidosis without coma associated with type 1 diabetes mellitus (HCC) 05/06/2016  . Acute respiratory failure (HCC) 05/06/2016  . Acute respiratory failure with hypoxia (HCC) 05/06/2016  . Acute encephalopathy 05/06/2016  . Septic shock (HCC)   . Acute respiratory failure with hypoxia and hypercapnia (HCC)   . 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 Depression   . Hypertension   . CKD (chronic kidney disease) stage 3, GFR 30-59 ml/min   . Hypothyroidism   . Vitamin B12 deficiency   . Benign paroxysmal positional vertigo   . Anxiety   . Allergic rhinitis   . Glaucoma   . Benign hypertension with CKD (chronic kidney disease) stage III   . Tobacco abuse   . Cervicalgia   . Elevated liver enzymes   . History of alcohol use     Orientation RESPIRATION BLADDER Height & Weight     Self, Time, Situation, Place  Normal Continent Weight: 125 lb (56.7 kg) Height:  5\' 2"  (157.5 cm)  BEHAVIORAL SYMPTOMS/MOOD NEUROLOGICAL BOWEL NUTRITION STATUS  Other (Comment) (NONE)  (NONE) Continent Diet  AMBULATORY STATUS COMMUNICATION OF NEEDS Skin   Extensive Assist Verbally Normal                       Personal Care Assistance Level of Assistance  Feeding, Dressing, Bathing Bathing Assistance: Maximum assistance Feeding assistance: Independent Dressing Assistance: Maximum assistance     Functional Limitations Info  Sight, Hearing, Speech Sight Info: Adequate Hearing Info: Adequate Speech Info:  Adequate    SPECIAL CARE FACTORS FREQUENCY  PT (By licensed PT)     PT Frequency: min 5x/week  OT Frequency: min 5x/week             Contractures Contractures Info: Not present    Additional Factors Info  Code Status, Allergies, Psychotropic, Insulin Sliding Scale Code Status Info: FULL Allergies Info: Ciprofloxacin; Hydrocodone; Codeine; Doxycycline; Omnicef; Augmentin Psychotropic Info: Lexapro; Ativan   Insulin Sliding Scale Info: Humalog 100 unit/mL inject 0.05 mLs (5 units total) 3x daily w/ meals; Lantus 100 unit/mL inject 0.08 mLs (8 units total) daily       Current Medications (10/16/2016):  This is the current hospital active medication list No current facility-administered medications for this encounter.    Current Outpatient Prescriptions  Medication Sig Dispense Refill  . aspirin 81 MG chewable tablet Chew 1 tablet (81 mg total) by mouth daily. 30 tablet 1  . atorvastatin (LIPITOR) 10 MG tablet Take 1 tablet (10 mg total) by mouth daily at 6 PM. 30 tablet 0  . carvedilol (COREG) 3.125 MG tablet Take 1 tablet (3.125 mg total) by mouth 2 (two) times daily with a meal. 60 tablet 0  . cefpodoxime (VANTIN) 200 MG tablet Take 1 tablet (200 mg total) by mouth every 12 (twelve) hours. 14 tablet 0  . clopidogrel (PLAVIX) 75 MG tablet Take 75 mg by mouth daily.    Marland Kitchen. escitalopram (LEXAPRO) 20 MG tablet Take 20 mg by mouth daily.    Marland Kitchen. HUMALOG 100 UNIT/ML injection Inject 0.05 mLs (5 Units total) into the skin 3 (three) times daily with meals. 10 mL 11  . hydrochlorothiazide (HYDRODIURIL) 25 MG tablet Take 0.5 tablets (12.5 mg total) by mouth daily. 30 tablet 0  . insulin glargine (LANTUS) 100 UNIT/ML injection Inject 0.08 mLs (8 Units total) into the skin daily. 10 mL 11  . iron polysaccharides (NIFEREX) 150 MG capsule Take 1 capsule (150 mg total) by mouth 2 (two) times daily. (Patient taking differently: Take 150 mg by mouth daily. ) 60 capsule 1  . levothyroxine (SYNTHROID, LEVOTHROID) 100 MCG tablet Take 1 tablet (100 mcg total) by mouth daily before breakfast. 30 tablet 0  . LORazepam (ATIVAN) 0.5 MG tablet Take 1 tablet (0.5 mg total) by mouth every 8 (eight) hours as needed for anxiety. 20 tablet 0  . losartan (COZAAR) 25 MG tablet Take 1 tablet (25 mg total) by mouth daily. 30 tablet 1  . meclizine (ANTIVERT) 25 MG tablet Take 1 tablet (25 mg total) by mouth 3 (three) times daily as  needed for dizziness. 30 tablet 0  . oxyCODONE-acetaminophen (PERCOCET/ROXICET) 5-325 MG tablet Take 2 tablets by mouth every 4 (four) hours as needed for severe pain. 15 tablet 0  . pantoprazole (PROTONIX) 40 MG tablet Take 40 mg by mouth daily.    . polyethylene glycol (MIRALAX / GLYCOLAX) packet Take 17 g by mouth every other day. (Patient taking differently: Take 17 g by mouth daily as needed for mild constipation. ) 14 each 0  . senna-docusate (SENOKOT-S) 8.6-50 MG tablet Take 1 tablet by mouth daily. (Patient taking differently: Take 1 tablet by mouth at bedtime as needed for mild constipation. ) 30 tablet 0  . amLODipine (NORVASC) 5 MG tablet Take 5 mg by mouth daily.    Marland Kitchen. LORazepam (ATIVAN) 0.5 MG tablet Take 1 tablet (0.5 mg total) by mouth every 8 (eight) hours as needed for anxiety. (Patient not taking: Reported on 10/16/2016) 1 tablet 0  .  oxyCODONE-acetaminophen (PERCOCET/ROXICET) 5-325 MG tablet Take 1 tablet by mouth every 4 (four) hours as needed for severe pain. (Patient not taking: Reported on 10/16/2016) 1 tablet 0     Discharge Medications: Please see discharge summary for a list of discharge medications.  Relevant Imaging Results:  Relevant Lab Results:   Additional Information SSN:  [id number]  [place], LCSW

## 2016-10-16 NOTE — Progress Notes (Signed)
OT Cancellation Note  Patient Details Name: Cassandra [name] MRN: [id number] DOB: 01/04/1946   Cancelled Treatment:    Reason Eval/Treat Not Completed: Medical issues which prohibited therapy. Per CT report, pt with new acute anterior fractures C6, C7, and T2.  She is awaiting neurosurgical consult.  Will defer OT/PT evals until cleared for activity by neurosurgery.  Liani Caris [place], OTR/L [phone]   Jeani HawkingConarpe, Mardee Clune M 10/16/2016, 2:41 PM

## 2016-10-16 NOTE — Progress Notes (Signed)
CSW spoke with pt's daughter/grandaughter re: SNF placement.  Bed offers provided, however family prefers placement in [place] so [place]  PT/OT cancelled today due to neurology c/s, noted.  Pt will need these evals prior to d/c for insurance co to approve SNF stay.  CSW will continue to follow for placement and facilitate SNF tx as appropriate.

## 2016-10-16 NOTE — ED Notes (Signed)
Soft cervical collar applied.

## 2016-10-16 NOTE — ED Provider Notes (Signed)
[facility]-EMERGENCY DEPT Provider Note   CSN: [id number] Arrival date & time: 10/16/16  4098     History   Chief Complaint Chief Complaint  Patient presents with  . Congestive Heart Failure    HPI Cassandra [name] is a 70 y.o. female.  HPI 70 year old female who presents with generalized weakness and diarrhea. She has a history of type 1 diabetes, stage III chronic kidney disease, grade 1 diastolic heart failure, CVA on Plavix, and hypertension. Was recently discharged from the hospital on October 25 after admission for DKA. Was discharged to skilled nursing facility. States that she decided she wanted to leave skilled nursing facility to live in her own home 2 days ago. States that since discharge from hospital she continues to feel weak and has had 3 weeks of diarrhea, dark and black in nature. States at home yesterday had mechanical fall with head strike. No LOC. States turned around too quickly and lost balance.   Yesterday with nausea and nonbilious, non-bloody vomiting x 1. Unable to care for self at home, and returns to ED for evaluation. Persistent urinary frequency, but no dysuria or abdominal pain. No chest pain, difficulty breathing, cough, congestion, orthopnea or PND. Does note lower extremity swelling, but states that she has always had this, minimally worsened since discharge from the hospital when she was discontinued from her Lasix.  Past Medical History:  Diagnosis Date  . Allergic rhinitis   . Anxiety   . Benign hypertension with CKD (chronic kidney disease) stage III   . Benign paroxysmal positional vertigo   . Bipolar disorder (HCC)   . Broken finger   . Broken shoulder   . Broken toes   . Cervicalgia   . CHF (congestive heart failure) (HCC) 07/2016  . CKD (chronic kidney disease) stage 3, GFR 30-59 ml/min   . Depression   . Diabetes mellitus without complication (HCC)   . Elevated liver enzymes Hep B/C neg 2014  . GERD (gastroesophageal reflux disease)   .  Glaucoma   . History of alcohol use   . Hypertension   . Hypothyroidism   . Interstitial cystitis    bladder stretched every 9 months  . Migraines   . Psoriasis   . Stroke (HCC) 05/16/2016   Left occipital and thalamic, right hippocampal  . Thyroid disease   . Tobacco use   . 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 Vitamin B12 deficiency     Patient Active Problem List   Diagnosis Date Noted  . Gram-negative bacteremia 10/07/2016  . Thrombocytopenia (HCC) 10/07/2016  . Arterial hypotension   . Hyperkalemia   . Non-intractable vomiting   . Gastroesophageal reflux disease   . H/O: CVA (cerebrovascular accident)   . DKA, type 1 (HCC) 09/06/2016  . Hypoglycemia associated with diabetes (HCC)   . Orthostatic hypotension   . Protein-calorie malnutrition, severe 08/28/2016  . UTI (lower urinary tract infection) 07/19/2016  . AKI (acute kidney injury) (HCC) 07/19/2016  . Chronic diastolic heart failure (HCC) 07/19/2016  . Dehydration   . Cerebral embolism with cerebral infarction 07/13/2016  . Acute diastolic CHF (congestive heart failure) (HCC) 07/11/2016  . Cerebrovascular accident (CVA) (HCC)   . Essential hypertension   . HLD (hyperlipidemia)   . Anemia of chronic disease   . Physical deconditioning   . Acute renal failure superimposed on stage 3 chronic kidney disease (HCC)   . Diabetic ketoacidosis with coma associated with type 1 diabetes mellitus (HCC)   .  History of ETT   . Stroke (HCC) 05/16/2016  . Cerebral thrombosis with cerebral infarction 05/15/2016  . Acute on chronic renal failure (HCC)   . Generalized anxiety disorder 05/10/2016  . Diabetic ketoacidosis without coma associated with type 1 diabetes mellitus (HCC) 05/06/2016  . Acute respiratory failure (HCC) 05/06/2016  . Acute respiratory failure with hypoxia (HCC) 05/06/2016  . Acute encephalopathy 05/06/2016  . Septic shock (HCC)   . Acute respiratory failure with hypoxia and  hypercapnia (HCC)   . 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 Depression   . Hypertension   . CKD (chronic kidney disease) stage 3, GFR 30-59 ml/min   . Hypothyroidism   . Vitamin B12 deficiency   . Benign paroxysmal positional vertigo   . Anxiety   . Allergic rhinitis   . Glaucoma   . Benign hypertension with CKD (chronic kidney disease) stage III   . Tobacco abuse   . Cervicalgia   . Elevated liver enzymes   . History of alcohol use     Past Surgical History:  Procedure Laterality Date  . ABDOMINAL HYSTERECTOMY     with oophorectomy  . APPENDECTOMY    . BLADDER SURGERY    . CATARACT EXTRACTION    . CERVICAL DISC SURGERY    . CHOLECYSTECTOMY    . HERNIA REPAIR    . TONSILLECTOMY      OB History    No data available       Home Medications    Prior to Admission medications   Medication Sig Start Date End Date Taking? Authorizing Provider  aspirin 81 MG chewable tablet Chew 1 tablet (81 mg total) by mouth daily. 09/24/16  Yes Vassie Loll, MD  atorvastatin (LIPITOR) 10 MG tablet Take 1 tablet (10 mg total) by mouth daily at 6 PM. 05/19/16  Yes Rolly Salter, MD  carvedilol (COREG) 3.125 MG tablet Take 1 tablet (3.125 mg total) by mouth 2 (two) times daily with a meal. 07/18/16  Yes Mauricio Annett Gula, MD  cefpodoxime (VANTIN) 200 MG tablet Take 1 tablet (200 mg total) by mouth every 12 (twelve) hours. 10/09/16  Yes Jerald Kief, MD  clopidogrel (PLAVIX) 75 MG tablet Take 75 mg by mouth daily.   Yes Historical Provider, MD  escitalopram (LEXAPRO) 20 MG tablet Take 20 mg by mouth daily.   Yes Historical Provider, MD  HUMALOG 100 UNIT/ML injection Inject 0.05 mLs (5 Units total) into the skin 3 (three) times daily with meals. 09/23/16  Yes Vassie Loll, MD  hydrochlorothiazide (HYDRODIURIL) 25 MG tablet Take 0.5 tablets (12.5 mg total) by mouth daily. 09/28/16  Yes Mancel Bale, MD  insulin glargine (LANTUS) 100 UNIT/ML injection Inject 0.08 mLs (8  Units total) into the skin daily. 09/24/16  Yes Vassie Loll, MD  iron polysaccharides (NIFEREX) 150 MG capsule Take 1 capsule (150 mg total) by mouth 2 (two) times daily. Patient taking differently: Take 150 mg by mouth daily.  06/28/16  Yes Vassie Loll, MD  levothyroxine (SYNTHROID, LEVOTHROID) 100 MCG tablet Take 1 tablet (100 mcg total) by mouth daily before breakfast. 10/10/16  Yes Jerald Kief, MD  LORazepam (ATIVAN) 0.5 MG tablet Take 1 tablet (0.5 mg total) by mouth every 8 (eight) hours as needed for anxiety. 05/19/16  Yes Rolly Salter, MD  losartan (COZAAR) 25 MG tablet Take 1 tablet (25 mg total) by mouth daily. 09/23/16  Yes Vassie Loll, MD  meclizine (ANTIVERT) 25 MG tablet Take  1 tablet (25 mg total) by mouth 3 (three) times daily as needed for dizziness. 02/12/16  Yes Chilton Si, MD  oxyCODONE-acetaminophen (PERCOCET/ROXICET) 5-325 MG tablet Take 2 tablets by mouth every 4 (four) hours as needed for severe pain. 09/18/16  Yes Maia Plan, MD  pantoprazole (PROTONIX) 40 MG tablet Take 40 mg by mouth daily.   Yes Historical Provider, MD  polyethylene glycol (MIRALAX / GLYCOLAX) packet Take 17 g by mouth every other day. Patient taking differently: Take 17 g by mouth daily as needed for mild constipation.  05/19/16  Yes Rolly Salter, MD  senna-docusate (SENOKOT-S) 8.6-50 MG tablet Take 1 tablet by mouth daily. Patient taking differently: Take 1 tablet by mouth at bedtime as needed for mild constipation.  09/18/16  Yes Maia Plan, MD  amLODipine (NORVASC) 5 MG tablet Take 5 mg by mouth daily.    Historical Provider, MD  LORazepam (ATIVAN) 0.5 MG tablet Take 1 tablet (0.5 mg total) by mouth every 8 (eight) hours as needed for anxiety. Patient not taking: Reported on 10/16/2016 10/09/16   Jerald Kief, MD  oxyCODONE-acetaminophen (PERCOCET/ROXICET) 5-325 MG tablet Take 1 tablet by mouth every 4 (four) hours as needed for severe pain. Patient not taking: Reported on 10/16/2016  10/09/16   Jerald Kief, MD    Family History Family History  Problem Relation Age of Onset  . Alcohol abuse Mother   . Arthritis Mother   . Asthma Mother   . Cancer Mother     colon cancer  . Hypertension Mother   . Migraines Mother   . Stroke Mother   . Lung disease Mother   . COPD Mother   . Diabetes Father   . Hypertension Father   . Heart disease Father   . Heart attack Father   . Heart disease Paternal Grandmother   . Diabetes Paternal Grandmother   . Stroke Paternal Grandmother   . Cancer Paternal Grandmother   . Diabetes Paternal Grandfather     Social History Social History  Substance Use Topics  . Smoking status: Current Every Day Smoker    Packs/day: 0.25    Types: Cigarettes  . Smokeless tobacco: Never Used  . Alcohol use No     Comment: Hasn't had any alcohol since last admission     Allergies   Ciprofloxacin; Hydrocodone; Codeine; Doxycycline; Omnicef [cefdinir]; and Augmentin [amoxicillin-pot clavulanate]   Review of Systems Review of Systems 10/14 systems reviewed and are negative other than those stated in the HPI   Physical Exam Updated Vital Signs BP 178/89   Pulse 88   Temp 98.9 F (37.2 C) (Oral)   Resp 15   Ht 5\' 2"  (1.575 m)   Wt 125 lb (56.7 kg)   SpO2 100%   BMI 22.86 kg/m   Physical Exam Physical Exam  Nursing note and vitals reviewed. Constitutional: listless, non-toxic, and in no acute distress Head: Normocephalic and atraumatic.  Mouth/Throat: Oropharynx is clear and dry.  Neck: Normal range of motion. Neck supple.  Cardiovascular: Normal rate and regular rhythm.   Pulmonary/Chest: Effort normal and breath sounds normal.  Abdominal: Soft. There is no tenderness. There is no rebound and no guarding.  Musculoskeletal: Normal range of motion.  Neurological: Alert, no facial droop, fluent speech, moves all extremities symmetrically Skin: Skin is warm and dry.  Psychiatric: Cooperative   ED Treatments / Results    Labs (all labs ordered are listed, but only abnormal results are displayed) Labs Reviewed  BASIC METABOLIC PANEL - Abnormal; Notable for the following:       Result Value   Glucose, Bld 376 (*)    Creatinine, Ser 1.38 (*)    Calcium 8.8 (*)    GFR calc non Af Amer 38 (*)    GFR calc Af Amer 44 (*)    All other components within normal limits  CBC - Abnormal; Notable for the following:    WBC 15.2 (*)    All other components within normal limits  URINALYSIS, ROUTINE W REFLEX MICROSCOPIC (NOT AT [facility]) - Abnormal; Notable for the following:    Glucose, UA >1000 (*)    Hgb urine dipstick TRACE (*)    Ketones, ur 15 (*)    Protein, ur >300 (*)    All other components within normal limits  HEPATIC FUNCTION PANEL - Abnormal; Notable for the following:    Total Protein 6.0 (*)    Albumin 2.4 (*)    Alkaline Phosphatase 154 (*)    Bilirubin, Direct <0.1 (*)    All other components within normal limits  BRAIN NATRIURETIC PEPTIDE - Abnormal; Notable for the following:    B Natriuretic Peptide 277.7 (*)    All other components within normal limits  T4, FREE - Abnormal; Notable for the following:    Free T4 1.18 (*)    All other components within normal limits  DIFFERENTIAL - Abnormal; Notable for the following:    Neutro Abs 13.5 (*)    All other components within normal limits  TSH - Abnormal; Notable for the following:    TSH 135.864 (*)    All other components within normal limits  URINE MICROSCOPIC-ADD ON - Abnormal; Notable for the following:    Squamous Epithelial / LPF 0-5 (*)    Bacteria, UA RARE (*)    All other components within normal limits  CBG MONITORING, ED - Abnormal; Notable for the following:    Glucose-Capillary 369 (*)    All other components within normal limits  I-STAT TROPOININ, ED  POC OCCULT BLOOD, ED  TYPE AND SCREEN    EKG  EKG Interpretation  Date/Time:  Wednesday October 16 2016 10:09:13 EDT Ventricular Rate:  84 PR Interval:    QRS  Duration: 73 QT Interval:  389 QTC Calculation: 460 R Axis:   56 Text Interpretation:  Sinus rhythm Low voltage, extremity and precordial leads no acute changes  Confirmed by Valbona Slabach MD, Caylen Yardley [phone]) on 10/16/2016 11:22:03 AM       Radiology Dg Chest 2 View  Result Date: 10/16/2016 CLINICAL DATA:  Chest pain, diarrhea for 1 week EXAM: CHEST  2 VIEW COMPARISON:  10/05/2016 FINDINGS: Cardiomediastinal silhouette is stable. There is small bilateral pleural effusion. Bilateral basilar atelectasis or infiltrate. Small fluid is noted in right fissure. No pulmonary edema. Mild degenerative changes thoracic spine. IMPRESSION: Small bilateral pleural effusion. Bilateral basilar atelectasis or infiltrate. No pulmonary edema. Electronically Signed   By: Natasha MeadLiviu  Pop M.D.   On: 10/16/2016 11:38   Ct Head Wo Contrast  Addendum Date: 10/16/2016   ADDENDUM REPORT: 10/16/2016 12:37 ADDENDUM: The original report was by Dr. Gaylyn RongWalter Liebkemann. The following addendum is by Dr. Gaylyn RongWalter Liebkemann: These results were called by telephone at the time of interpretation on 10/16/2016 at 12:31 pm to Dr. Crista CurbANA Alson Mcpheeters , who verbally acknowledged these results. Electronically Signed   By: Gaylyn RongWalter  Liebkemann M.D.   On: 10/16/2016 12:37   Result Date: 10/16/2016 CLINICAL DATA:  Fall this past Sunday. Posterior  headache. Progressive weakness. Diabetes. EXAM: CT HEAD WITHOUT CONTRAST CT CERVICAL SPINE WITHOUT CONTRAST TECHNIQUE: Multidetector CT imaging of the head and cervical spine was performed following the standard protocol without intravenous contrast. Multiplanar CT image reconstructions of the cervical spine were also generated. COMPARISON:  09/06/2016 FINDINGS: CT HEAD FINDINGS Brain: Remote 4 mm lacunar infarct in the left cerebral peduncle extending into the left thalamus. Periventricular white matter and corona radiata hypodensities favor chronic ischemic microvascular white matter disease. Prior graft otherwise, The brainstem,  cerebellum, cerebral peduncles, thalami, basal ganglia, basilar cisterns, and ventricular system appear within normal limits. No intracranial hemorrhage, mass lesion, or acute CVA. Vascular: There is atherosclerotic calcification of the cavernous carotid arteries bilaterally. Skull: Unremarkable Sinuses/Orbits: Mild chronic ethmoid, maxillary, and sphenoid sinusitis. Hypoplastic frontal sinuses. Other: Right posterior parietal scalp soft tissue swelling. CT CERVICAL SPINE FINDINGS Alignment: 2.5 mm degenerative anterolisthesis at C4-5, not changed from prior. Skull base and vertebrae: Fracture through the base of the anterior osteophyte at the left anterior C6 vertebral level as on image 55/12. Interval coronal vertically oriented fracture through the anterior portion of the C7 vertebral body as shown on images 25 through 36 series 11, separating the anterior vertebral body and bridging anterior osteophytes from the posterior vertebral body. At T1, there is a fracture extending through the anterior osteophytes along the anterior cortical margin of the vertebral, with some cortical involvement for example on image 33/11. On the prior exam these osteophytes were essentially incorporated into the anterior vertebral body. Fracture through the base of the anterior osteophyte at T2 along the superior endplate. No posterior element involvement identified. ACDF at C5-6. Soft tissues and spinal canal: Mild stranding along the left lower carotid space likely related to the fractures. Disc levels: At C2-3 there is a posterior osteophyte causing mild central narrowing of the thecal sac. Uncinate and facet spurring cause right foraminal impingement at C3-4 and to a lesser degree at C4-5 and C6-7 ; and left foraminal stenosis at C2-3. Probable disc bulge at the C6-7 level. Upper chest: Bilateral pleural effusions. Other: Old deformity of the right medial clavicle. IMPRESSION: 1. New acute anterior fractures at C6, C7, T1, and T2.  At the C7 level the fracture represents a coronally oriented fracture through the anterior vertebral body. At the other levels, the fracture represents vertically oriented coronal fractures extending through the anterior bridging osteophytes. There is adjacent soft tissue edema more notably tracking in the left lower carotid space. No acute subluxation. No posterior element involvement. 2. Chronic 2.5 mm of degenerative anterolisthesis at C4-5. ACDF at C5-6. 3. Old fracture of the right medial clavicle, healed, with secondary degenerative right sternoclavicular arthropathy. 4. Bilateral pleural effusions. 5. Cervical spondylosis and degenerative disc disease causing impingement at C2- 3, C3-4, C4-5, and C6-7. 6. Intracranially, there is evidence of chronic microvascular white matter disease and a remote lacunar infarct involving the left thalamus and left cerebral peduncle, but no acute intracranial findings. 7. Chronic paranasal sinusitis. 8. Right posterior parietal scalp soft tissue swelling. Radiology assistant personnel have been notified to put me in telephone contact with the referring physician or the referring physician's clinical representative in order to discuss these findings. Once this communication is established I will issue an addendum to this report for documentation purposes. Electronically Signed: By: Gaylyn RongWalter  Liebkemann M.D. On: 10/16/2016 12:29   Ct Cervical Spine Wo Contrast  Addendum Date: 10/16/2016   ADDENDUM REPORT: 10/16/2016 12:37 ADDENDUM: The original report was by Dr. Gaylyn RongWalter Liebkemann. The following  addendum is by Dr. Gaylyn Rong: These results were called by telephone at the time of interpretation on 10/16/2016 at 12:31 pm to Dr. Crista Curb , who verbally acknowledged these results. Electronically Signed   By: Gaylyn Rong M.D.   On: 10/16/2016 12:37   Result Date: 10/16/2016 CLINICAL DATA:  Fall this past Sunday. Posterior headache. Progressive weakness. Diabetes.  EXAM: CT HEAD WITHOUT CONTRAST CT CERVICAL SPINE WITHOUT CONTRAST TECHNIQUE: Multidetector CT imaging of the head and cervical spine was performed following the standard protocol without intravenous contrast. Multiplanar CT image reconstructions of the cervical spine were also generated. COMPARISON:  09/06/2016 FINDINGS: CT HEAD FINDINGS Brain: Remote 4 mm lacunar infarct in the left cerebral peduncle extending into the left thalamus. Periventricular white matter and corona radiata hypodensities favor chronic ischemic microvascular white matter disease. Prior graft otherwise, The brainstem, cerebellum, cerebral peduncles, thalami, basal ganglia, basilar cisterns, and ventricular system appear within normal limits. No intracranial hemorrhage, mass lesion, or acute CVA. Vascular: There is atherosclerotic calcification of the cavernous carotid arteries bilaterally. Skull: Unremarkable Sinuses/Orbits: Mild chronic ethmoid, maxillary, and sphenoid sinusitis. Hypoplastic frontal sinuses. Other: Right posterior parietal scalp soft tissue swelling. CT CERVICAL SPINE FINDINGS Alignment: 2.5 mm degenerative anterolisthesis at C4-5, not changed from prior. Skull base and vertebrae: Fracture through the base of the anterior osteophyte at the left anterior C6 vertebral level as on image 55/12. Interval coronal vertically oriented fracture through the anterior portion of the C7 vertebral body as shown on images 25 through 36 series 11, separating the anterior vertebral body and bridging anterior osteophytes from the posterior vertebral body. At T1, there is a fracture extending through the anterior osteophytes along the anterior cortical margin of the vertebral, with some cortical involvement for example on image 33/11. On the prior exam these osteophytes were essentially incorporated into the anterior vertebral body. Fracture through the base of the anterior osteophyte at T2 along the superior endplate. No posterior element  involvement identified. ACDF at C5-6. Soft tissues and spinal canal: Mild stranding along the left lower carotid space likely related to the fractures. Disc levels: At C2-3 there is a posterior osteophyte causing mild central narrowing of the thecal sac. Uncinate and facet spurring cause right foraminal impingement at C3-4 and to a lesser degree at C4-5 and C6-7 ; and left foraminal stenosis at C2-3. Probable disc bulge at the C6-7 level. Upper chest: Bilateral pleural effusions. Other: Old deformity of the right medial clavicle. IMPRESSION: 1. New acute anterior fractures at C6, C7, T1, and T2. At the C7 level the fracture represents a coronally oriented fracture through the anterior vertebral body. At the other levels, the fracture represents vertically oriented coronal fractures extending through the anterior bridging osteophytes. There is adjacent soft tissue edema more notably tracking in the left lower carotid space. No acute subluxation. No posterior element involvement. 2. Chronic 2.5 mm of degenerative anterolisthesis at C4-5. ACDF at C5-6. 3. Old fracture of the right medial clavicle, healed, with secondary degenerative right sternoclavicular arthropathy. 4. Bilateral pleural effusions. 5. Cervical spondylosis and degenerative disc disease causing impingement at C2- 3, C3-4, C4-5, and C6-7. 6. Intracranially, there is evidence of chronic microvascular white matter disease and a remote lacunar infarct involving the left thalamus and left cerebral peduncle, but no acute intracranial findings. 7. Chronic paranasal sinusitis. 8. Right posterior parietal scalp soft tissue swelling. Radiology assistant personnel have been notified to put me in telephone contact with the referring physician or the referring physician's clinical representative in  order to discuss these findings. Once this communication is established I will issue an addendum to this report for documentation purposes. Electronically Signed: By:  Gaylyn Rong M.D. On: 10/16/2016 12:29    Procedures Procedures (including critical care time)  Medications Ordered in ED Medications  simethicone (MYLICON) chewable tablet 80 mg (not administered)  sodium chloride 0.9 % bolus 1,000 mL (0 mLs Intravenous Stopped 10/16/16 1315)  oxyCODONE-acetaminophen (PERCOCET/ROXICET) 5-325 MG per tablet 1 tablet (1 tablet Oral Given 10/16/16 1729)     Initial Impression / Assessment and Plan / ED Course  I have reviewed the triage vital signs and the nursing notes.  Pertinent labs & imaging results that were available during my care of the patient were reviewed by me and considered in my medical decision making (see chart for details).  Clinical Course   70 year old female who presents with generalized weakness and fatigue with mechanical fall yesterday. Appears dry on exam. Vital signs reassuring. Grossly neuro in tact. Remainder of exam nonfocal. States she has been feeling this unwell since discharge from hospital and recognize she needs skilled nursing facility.   From traumatic w/u due to fall, CT head and c-spine performed as on plavix. CT head w/o hemorrhage or other acute injury. CT cervical spine with C7 vertebral body fracture and anterior osteophyte fractures of C6-T2. She is neuro in tact. Discussed with Dr. Mikal Plane who states fractures are stable and non-surgical. Has seen Dr. Dutch Quint, who she will follow-up with as outpatient. Does not require brace, only for comfort. No other traumatic injuries noted on exam or by history.   She is hyperglycemic here, but no evidence of DKA. With dehydration as well, with mild AKI since discharge from hospital. Blood work appears hemoconcentrated as WBC, Hgb, and plts all elevated from her baseline. Given IVF here, and eating and drinking while here in ED. No evidence of diarrhea here, has solid stool in the ED.   No evidence of infection in urine and chest, afebrile here.   At this time, I do not  think she requires admission. Discussed with social work, who states multiple skilled nursing facility able to accept patient without admission. At this time, pending social work evaluation for SNF placement    Final Clinical Impressions(s) / ED Diagnoses   Final diagnoses:  Hyperglycemia  Dehydration  AKI (acute kidney injury) (HCC)  Other closed nondisplaced fracture of seventh cervical vertebra, initial encounter [facility])    New Prescriptions New Prescriptions   No medications on file     Lavera Guise, MD 10/16/16 [phone]

## 2016-10-17 ENCOUNTER — Encounter (HOSPITAL_COMMUNITY): Payer: Self-pay | Admitting: General Practice

## 2016-10-17 DIAGNOSIS — K838 Other specified diseases of biliary tract: Secondary | ICD-10-CM | POA: Diagnosis present

## 2016-10-17 DIAGNOSIS — S22009A Unspecified fracture of unspecified thoracic vertebra, initial encounter for closed fracture: Secondary | ICD-10-CM | POA: Diagnosis present

## 2016-10-17 DIAGNOSIS — E1165 Type 2 diabetes mellitus with hyperglycemia: Secondary | ICD-10-CM | POA: Diagnosis not present

## 2016-10-17 DIAGNOSIS — Y92129 Unspecified place in nursing home as the place of occurrence of the external cause: Secondary | ICD-10-CM | POA: Diagnosis not present

## 2016-10-17 DIAGNOSIS — Z8673 Personal history of transient ischemic attack (TIA), and cerebral infarction without residual deficits: Secondary | ICD-10-CM | POA: Diagnosis not present

## 2016-10-17 DIAGNOSIS — R7881 Bacteremia: Secondary | ICD-10-CM

## 2016-10-17 DIAGNOSIS — W19XXXA Unspecified fall, initial encounter: Secondary | ICD-10-CM | POA: Diagnosis present

## 2016-10-17 DIAGNOSIS — E43 Unspecified severe protein-calorie malnutrition: Secondary | ICD-10-CM | POA: Diagnosis not present

## 2016-10-17 DIAGNOSIS — S12600A Unspecified displaced fracture of seventh cervical vertebra, initial encounter for closed fracture: Secondary | ICD-10-CM | POA: Diagnosis not present

## 2016-10-17 DIAGNOSIS — A0472 Enterocolitis due to Clostridium difficile, not specified as recurrent: Secondary | ICD-10-CM | POA: Diagnosis present

## 2016-10-17 DIAGNOSIS — R5381 Other malaise: Secondary | ICD-10-CM

## 2016-10-17 DIAGNOSIS — E11649 Type 2 diabetes mellitus with hypoglycemia without coma: Secondary | ICD-10-CM | POA: Diagnosis not present

## 2016-10-17 DIAGNOSIS — S129XXA Fracture of neck, unspecified, initial encounter: Secondary | ICD-10-CM | POA: Diagnosis present

## 2016-10-17 DIAGNOSIS — Z794 Long term (current) use of insulin: Secondary | ICD-10-CM | POA: Diagnosis not present

## 2016-10-17 DIAGNOSIS — S22019A Unspecified fracture of first thoracic vertebra, initial encounter for closed fracture: Secondary | ICD-10-CM | POA: Diagnosis not present

## 2016-10-17 DIAGNOSIS — E039 Hypothyroidism, unspecified: Secondary | ICD-10-CM | POA: Diagnosis not present

## 2016-10-17 DIAGNOSIS — E1122 Type 2 diabetes mellitus with diabetic chronic kidney disease: Secondary | ICD-10-CM | POA: Diagnosis not present

## 2016-10-17 DIAGNOSIS — H8110 Benign paroxysmal vertigo, unspecified ear: Secondary | ICD-10-CM | POA: Diagnosis not present

## 2016-10-17 DIAGNOSIS — N179 Acute kidney failure, unspecified: Secondary | ICD-10-CM | POA: Diagnosis present

## 2016-10-17 DIAGNOSIS — F419 Anxiety disorder, unspecified: Secondary | ICD-10-CM | POA: Diagnosis not present

## 2016-10-17 DIAGNOSIS — S22029A Unspecified fracture of second thoracic vertebra, initial encounter for closed fracture: Secondary | ICD-10-CM | POA: Diagnosis not present

## 2016-10-17 DIAGNOSIS — D638 Anemia in other chronic diseases classified elsewhere: Secondary | ICD-10-CM | POA: Diagnosis not present

## 2016-10-17 DIAGNOSIS — R197 Diarrhea, unspecified: Secondary | ICD-10-CM | POA: Diagnosis present

## 2016-10-17 DIAGNOSIS — Z79899 Other long term (current) drug therapy: Secondary | ICD-10-CM | POA: Diagnosis not present

## 2016-10-17 DIAGNOSIS — E860 Dehydration: Secondary | ICD-10-CM | POA: Diagnosis not present

## 2016-10-17 DIAGNOSIS — S12500A Unspecified displaced fracture of sixth cervical vertebra, initial encounter for closed fracture: Secondary | ICD-10-CM | POA: Diagnosis not present

## 2016-10-17 LAB — VITAMIN B12: Vitamin B-12: 1085 pg/mL — ABNORMAL HIGH (ref 180–914)

## 2016-10-17 LAB — IRON AND TIBC
IRON: 56 ug/dL (ref 28–170)
Saturation Ratios: 30 % (ref 10.4–31.8)
TIBC: 189 ug/dL — ABNORMAL LOW (ref 250–450)
UIBC: 133 ug/dL

## 2016-10-17 LAB — RETICULOCYTES
RBC.: 3.31 MIL/uL — ABNORMAL LOW (ref 3.87–5.11)
RETIC COUNT ABSOLUTE: 49.7 10*3/uL (ref 19.0–186.0)
Retic Ct Pct: 1.5 % (ref 0.4–3.1)

## 2016-10-17 LAB — CBC
HCT: 29.7 % — ABNORMAL LOW (ref 36.0–46.0)
Hemoglobin: 9.9 g/dL — ABNORMAL LOW (ref 12.0–15.0)
MCH: 29.4 pg (ref 26.0–34.0)
MCHC: 33.3 g/dL (ref 30.0–36.0)
MCV: 88.1 fL (ref 78.0–100.0)
PLATELETS: 302 10*3/uL (ref 150–400)
RBC: 3.37 MIL/uL — AB (ref 3.87–5.11)
RDW: 13.7 % (ref 11.5–15.5)
WBC: 9.3 10*3/uL (ref 4.0–10.5)

## 2016-10-17 LAB — CLOSTRIDIUM DIFFICILE BY PCR: CDIFFPCR: POSITIVE — AB

## 2016-10-17 LAB — CBG MONITORING, ED
GLUCOSE-CAPILLARY: 400 mg/dL — AB (ref 65–99)
Glucose-Capillary: 100 mg/dL — ABNORMAL HIGH (ref 65–99)

## 2016-10-17 LAB — GLUCOSE, CAPILLARY
GLUCOSE-CAPILLARY: 92 mg/dL (ref 65–99)
Glucose-Capillary: 80 mg/dL (ref 65–99)

## 2016-10-17 LAB — BASIC METABOLIC PANEL
Anion gap: 5 (ref 5–15)
BUN: 17 mg/dL (ref 6–20)
CALCIUM: 7.5 mg/dL — AB (ref 8.9–10.3)
CO2: 25 mmol/L (ref 22–32)
CREATININE: 1.31 mg/dL — AB (ref 0.44–1.00)
Chloride: 106 mmol/L (ref 101–111)
GFR calc non Af Amer: 41 mL/min — ABNORMAL LOW (ref >60)
GFR, EST AFRICAN AMERICAN: 47 mL/min — AB (ref >60)
Glucose, Bld: 147 mg/dL — ABNORMAL HIGH (ref 65–99)
Potassium: 3.5 mmol/L (ref 3.5–5.1)
SODIUM: 136 mmol/L (ref 135–145)

## 2016-10-17 LAB — PREALBUMIN: Prealbumin: 16.3 mg/dL — ABNORMAL LOW (ref 18–38)

## 2016-10-17 LAB — C-REACTIVE PROTEIN: CRP: 1.9 mg/dL — ABNORMAL HIGH (ref <1.0)

## 2016-10-17 LAB — C DIFFICILE QUICK SCREEN W PCR REFLEX
C Diff antigen: POSITIVE — AB
C Diff toxin: NEGATIVE

## 2016-10-17 LAB — PROCALCITONIN: Procalcitonin: 3 ng/mL

## 2016-10-17 LAB — FOLATE: Folate: 19.6 ng/mL (ref >5.9)

## 2016-10-17 LAB — FERRITIN: FERRITIN: 211 ng/mL (ref 11–307)

## 2016-10-17 LAB — SEDIMENTATION RATE: SED RATE: 40 mm/h — AB (ref 0–22)

## 2016-10-17 MED ORDER — METRONIDAZOLE IN NACL 5-0.79 MG/ML-% IV SOLN
500.0000 mg | Freq: Three times a day (TID) | INTRAVENOUS | Status: DC
  Administered 2016-10-17 – 2016-10-18 (×3): 500 mg via INTRAVENOUS
  Filled 2016-10-17 (×3): qty 100

## 2016-10-17 MED ORDER — HYDRALAZINE HCL 20 MG/ML IJ SOLN: 10.0000 mg | Freq: Four times a day (QID) | INTRAMUSCULAR | Status: DC | PRN

## 2016-10-17 MED ORDER — ENOXAPARIN SODIUM 40 MG/0.4ML ~~LOC~~ SOLN
40.0000 mg | SUBCUTANEOUS | Status: DC
  Administered 2016-10-17 – 2016-10-22 (×5): 40 mg via SUBCUTANEOUS
  Filled 2016-10-17 (×5): qty 0.4

## 2016-10-17 MED ORDER — ACETAMINOPHEN 325 MG PO TABS
650.0000 mg | ORAL_TABLET | Freq: Four times a day (QID) | ORAL | Status: DC | PRN
  Administered 2016-10-23: 650 mg via ORAL
  Filled 2016-10-17: qty 2

## 2016-10-17 MED ORDER — INSULIN ASPART 100 UNIT/ML ~~LOC~~ SOLN: 0.0000 [IU] | Freq: Three times a day (TID) | SUBCUTANEOUS | Status: DC

## 2016-10-17 MED ORDER — ONDANSETRON HCL 4 MG PO TABS
4.0000 mg | ORAL_TABLET | Freq: Four times a day (QID) | ORAL | Status: DC | PRN
  Administered 2016-10-20: 4 mg via ORAL
  Filled 2016-10-17: qty 1

## 2016-10-17 MED ORDER — ACETAMINOPHEN 650 MG RE SUPP: 650.0000 mg | Freq: Four times a day (QID) | RECTAL | Status: DC | PRN

## 2016-10-17 MED ORDER — INSULIN ASPART 100 UNIT/ML ~~LOC~~ SOLN: 0.0000 [IU] | Freq: Every day | SUBCUTANEOUS | Status: DC

## 2016-10-17 MED ORDER — INSULIN ASPART 100 UNIT/ML ~~LOC~~ SOLN
0.0000 [IU] | Freq: Three times a day (TID) | SUBCUTANEOUS | Status: DC
  Administered 2016-10-18: 5 [IU] via SUBCUTANEOUS
  Administered 2016-10-19 – 2016-10-20 (×2): 2 [IU] via SUBCUTANEOUS
  Administered 2016-10-20: 1 [IU] via SUBCUTANEOUS
  Administered 2016-10-20 – 2016-10-21 (×2): 3 [IU] via SUBCUTANEOUS
  Administered 2016-10-21: 9 [IU] via SUBCUTANEOUS
  Administered 2016-10-21: 5 [IU] via SUBCUTANEOUS
  Administered 2016-10-22: 9 [IU] via SUBCUTANEOUS
  Administered 2016-10-22: 7 [IU] via SUBCUTANEOUS
  Administered 2016-10-22 – 2016-10-23 (×3): 2 [IU] via SUBCUTANEOUS

## 2016-10-17 MED ORDER — ONDANSETRON HCL 4 MG/2ML IJ SOLN
4.0000 mg | Freq: Four times a day (QID) | INTRAMUSCULAR | Status: DC | PRN
  Administered 2016-10-17: 4 mg via INTRAVENOUS

## 2016-10-17 MED ORDER — MORPHINE SULFATE (PF) 4 MG/ML IV SOLN
1.0000 mg | INTRAVENOUS | Status: DC | PRN
  Administered 2016-10-17: 1 mg via INTRAVENOUS
  Administered 2016-10-17: 4 mg via INTRAVENOUS
  Administered 2016-10-18 – 2016-10-19 (×2): 2 mg via INTRAVENOUS
  Filled 2016-10-17 (×4): qty 1

## 2016-10-17 MED ORDER — LEVOTHYROXINE SODIUM 100 MCG IV SOLR
50.0000 ug | Freq: Every day | INTRAVENOUS | Status: DC
  Administered 2016-10-17: 50 ug via INTRAVENOUS
  Filled 2016-10-17 (×2): qty 5

## 2016-10-17 MED ORDER — SODIUM CHLORIDE 0.9 % IV SOLN
INTRAVENOUS | Status: DC
  Administered 2016-10-17: 10:00:00 via INTRAVENOUS

## 2016-10-17 MED ORDER — FAMOTIDINE IN NACL 20-0.9 MG/50ML-% IV SOLN
20.0000 mg | Freq: Two times a day (BID) | INTRAVENOUS | Status: DC
  Administered 2016-10-17 – 2016-10-19 (×5): 20 mg via INTRAVENOUS
  Filled 2016-10-17 (×6): qty 50

## 2016-10-17 NOTE — Evaluation (Signed)
Physical Therapy Evaluation Patient Details Name: Cassandra [name] MRN: [id number] DOB: 16-Nov-1946 Today's Date: 10/17/2016   History of Present Illness  pt presents with C6, C7, T1, and T2 fxs after a fall.  pt with hx of DM, CVA, BPPV, HTN, CKD, Anxiety, Depression, bipolar, and hx Etoh.    Clinical Impression  Pt generally weak and debilitated.  Pt at this time is not able to return home and will need continued therapies and care.  Feel pt will do best at SNF level of care at D/C.      Follow Up Recommendations SNF    Equipment Recommendations  None recommended by PT    Recommendations for Other Services       Precautions / Restrictions Precautions Precautions: Fall Restrictions Weight Bearing Restrictions: No      Mobility  Bed Mobility Overal bed mobility: Needs Assistance Bed Mobility: Rolling;Sidelying to Sit;Sit to Sidelying Rolling: Min assist Sidelying to sit: Min assist     Sit to sidelying: Mod assist General bed mobility comments: cues for log roll technique.  Transfers Overall transfer level: Needs assistance Equipment used: 2 person hand held assist Transfers: Sit to/from Stand Sit to Stand: Min assist;+2 physical assistance;From elevated surface         General transfer comment: cues for UE use and encouragement.  Anticipate pt would need increased A to stand from regular bed as opposed to pt is currently in an ED stretcher.    Ambulation/Gait             General Gait Details: pt [facility] 3 small [facility] x2 and Bil HHA.    Stairs            Wheelchair Mobility    Modified Rankin (Stroke Patients Only)       Balance Overall balance assessment: Needs assistance Sitting-balance support: Bilateral upper extremity supported;Feet supported Sitting balance-Leahy Scale: Poor     Standing balance support: Bilateral upper extremity supported;During functional activity Standing balance-Leahy Scale: Poor                                Pertinent Vitals/Pain Pain Assessment: Faces Faces Pain Scale: Hurts little more Pain Location: "All over" Pain Descriptors / Indicators: Grimacing Pain Intervention(s): Monitored during session;Premedicated before session;Repositioned    Home Living Family/patient expects to be discharged to:: Skilled nursing facility                      Prior Function Level of Independence: Needs assistance         Comments: Unsure level of A as pt just returned home from SNF ~1 week before admit and was not successful at home.       Hand Dominance        Extremity/Trunk Assessment   Upper Extremity Assessment: Generalized weakness           Lower Extremity Assessment: Generalized weakness      Cervical / Trunk Assessment: Kyphotic  Communication   Communication: No difficulties  Cognition Arousal/Alertness: Awake/alert Behavior During Therapy: WFL for tasks assessed/performed Overall Cognitive Status: No family/caregiver present to determine baseline cognitive functioning                      General Comments      Exercises     Assessment/Plan    PT Assessment Patient needs continued PT services  PT Problem List Decreased strength;Decreased activity tolerance;Decreased balance;Decreased mobility;Decreased coordination;Decreased knowledge of use of DME          PT Treatment Interventions DME instruction;Gait training;Functional mobility training;Therapeutic activities;Therapeutic exercise;Balance training;Patient/family education    PT Goals (Current goals can be found in the Care Plan section)  Acute Rehab PT Goals Patient Stated Goal: Feel better PT Goal Formulation: With patient Time For Goal Achievement: 10/31/16 Potential to Achieve Goals: Fair    Frequency Min 2X/week   Barriers to discharge        Co-evaluation               End of Session Equipment Utilized During Treatment: Gait  belt;Cervical collar Activity Tolerance: Patient limited by fatigue Patient left: in bed;with call bell/phone within reach Nurse Communication: Mobility status (pt on bed pan)         Time: 8119-14780905-0922 PT Time Calculation (min) (ACUTE ONLY): 17 min   Charges:   PT Evaluation $PT Eval Moderate Complexity: 1 Procedure     PT G CodesSunny Schlein:        Perlie Scheuring F, [place]PT [phone] 10/17/2016, 11:16 AM

## 2016-10-17 NOTE — ED Notes (Signed)
Dr. Merrell at bedside. 

## 2016-10-17 NOTE — ED Notes (Signed)
PT CBG was 100mg  nurse notified

## 2016-10-17 NOTE — ED Notes (Signed)
Admission NP at bedside

## 2016-10-17 NOTE — ED Notes (Signed)
MRI called while I ws in CT and pt. Needs to be NPO for the next 4 hours for her MRI

## 2016-10-17 NOTE — ED Provider Notes (Signed)
Reviewed records from Dr. Verdie MosherLiu and recent admission. Patient has had increasing hyperglycemia overnight. Of note, patient also has markedly elevated TSH as well. Patient was just admitted to Hospitalist service for DKA, also hypothyroidism requiring IV synthroid. Patient has multiple ongoing issues - hyperglycemia, myxedema/symptomatic hypothyroidism, recent cervical fractures in need of PT/OT. Feel pt requires admission for management/stabilization of these multiple issues, PT/OT, and further evaluation. Hospitalist consulted, will admit.   Cassandra Pollackameron Katryna Tschirhart, MD 10/17/16 [phone]

## 2016-10-17 NOTE — ED Notes (Signed)
Report called  

## 2016-10-17 NOTE — ED Notes (Signed)
Pt taken off of bed pan.

## 2016-10-17 NOTE — ED Notes (Signed)
Revonda StandardAllison NP at bedside

## 2016-10-17 NOTE — Progress Notes (Signed)
Inpatient Diabetes Program Recommendations  AACE/ADA: New Consensus Statement on Inpatient Glycemic Control (2015)  Target Ranges:  Prepandial:   less than 140 mg/dL      Peak postprandial:   less than 180 mg/dL (1-2 hours)      Critically ill patients:  140 - 180 mg/dL   Lab Results  Component Value Date   GLUCAP 400 (H) 10/17/2016   HGBA1C 9.2 (H) 09/20/2016   Results for Cassandra [name], Cassandra [name] (MRN [id number]) as of 10/17/2016 11:54  Ref. Range 10/17/2016 07:25  Glucose Latest Ref Range: 65 - 99 mg/dL 409147 (H)   Diabetes history: Type 1 diabetes Outpatient Diabetes medications: Humalog 5 units tid with meals, Lantus 8 units daily Current orders for Inpatient glycemic control:  Novolog moderate tid with meals and HS, Lantus 8 units daily  Inpatient Diabetes Program Recommendations:    Called and discussed with RN and NP.  Orders received to decrease Novolog correction to sensitive.  Blood sugars have been labile in the past with previous admissions.  Will follow.  Thanks, Beryl MeagerJenny Takelia Urieta, RN, BC-ADM Inpatient Diabetes Coordinator Pager [phone] (8a-5p)

## 2016-10-17 NOTE — H&P (Signed)
History and Physical    CARITA SOLLARS TKZ:[id number] DOB: 06-04-1946 DOA: 10/16/2016   PCP: Cammy Copa, MD   Patient coming from/Resides with: Private residence/previously living alone but after recent hospital admission daughters now taking turns living with the patient and caring for the patient  Admission status: Inpatient/medical floor -medically necessary to stay a minimum 2 midnights to rule out impending and/or unexpected changes in physiologic status that may differ from initial evaluation performed in the ER and/or at time of admission. Patient presents with multiple issues: Acute kidney injury secondary to diarrhea and medications in setting of presumed related to C. difficile colitis, uncontrolled diabetes with CBG greater than 500 with resultant volume depletion secondary to hyperglycemia, recent fall at nursing facility with new findings of cervical and thoracic vertebra fractures deemed nonsurgical by neurosurgery, symptomatic uncontrolled hypothyroidism. Patient will require IV fluids, IV Synthroid, partial bowel rest with clear liquids, IV Flagyl and IV pain medications. Also need to exclude inadequately treated UTI noting recent admission for UTI with gram-negative bacteremia (Enterobacter) apparently has completed outpatient antibiotics prescribed at time of discharge.  Chief Complaint: Increased extremity edema, uncontrolled diabetes and diarrhea  HPI: Cassandra [name] is a 70 y.o. female with medical history significant for hypothyroidism previously on generic Synthroid, diabetes on insulin, history of prior CVA, chronic kidney disease stage III, known left ventricular diastolic dysfunction, anemia chronic disease, anxiety and depression, apparent history of prior alcohol use, BPV, dyslipidemia and protein calorie malnutrition. Patient was discharged from this facility on 10/25. Patient was treated for sepsis related to Enterobacter UTI with associated bacteremia,  poorly controlled diabetes, as well as severe/uncontrolled hypothyroidism that was symptomatic with a TSH of 89.4 and a free T4 0.59. She was also found to have transaminitis and abdominal ultrasound to significantly dilated common bile duct of 10 mm with echogenic material within the CBD. GI had been consulted and plan was to follow-up with Dr. Oletta Lamas at [facility] gastroenterology to obtain an MRCP in one week. Patient met criteria to be discharged to skilled nursing facility and was discharged on 7 days of Vantin and it was recommended to utilize branded Synthroid for the patient upon discharge.  After discharge to the nursing facility, patient had a fall and fell backwards striking her head without loss of consciousness. The family was unhappy with the care she received at the facility and brought her back home on 10/31. They were given a prescription for Synthroid which was filled. The patient's 2 daughters have been caring for the patient since she has arrived back to her home environment. Since discharge the patient has not done well. She has been weak. She's been having fevers and chills and frequent loose mucoid green stools. She has become more puffy in her hands and her feet and her daughters have noticed her sugars have been difficult to control. She has been able to tolerate a solid diet but eats small portions. She was brought to the ER on 11/1 for evaluation.  In the ER the patient was found to be dehydrated and hyperglycemic and plans were initially to stabilize her in the ER and begin a bed search for a new facility per case manager/social worker discussion with patient's family. Unfortunately patient began complaining of abdominal pain and has had frequent loose mucoid diarrheal stools and her CBGs worsened to greater than 500. Her diet has subsequently been back down to clear liquids and we have started IV fluids on her inner exploring the possibility of active C.  difficile colitis and have  started IV Flagyl. Because of this she will require admission to the hospital for stabilization before planned discharge to skilled nursing facility. In addition to these findings and imaging did reveal stable cervical and thoracic vertebral fractures. EDP discussed with neurosurgery who recommended hard cervical collar and continued physical therapy with recommendation to make follow-up appointment with neurosurgery once discharged from skilled motion facility or in the next 2-4 weeks.  ED Course:  Vital Signs: BP 152/74   Pulse 73   Temp 98.9 F (37.2 C) (Oral)   Resp 15   Ht '5\' 2"'$  (1.575 m)   Wt 56.7 kg (125 lb)   SpO2 95%   BMI 22.86 kg/m  2 two-view chest x-ray: Small bilateral pleural effusion with basilar atelectasis without pulmonary edema. CT head and cervical spine without contrast: No acute intracranial findings, right posterior parietal scalp soft tissue swelling, new acute anterior fractures at C6, C7, T1 and T2 with chronic cervical spondylosis and degenerative disc disease causing impingement at C2-3, C3-4, C4-5, and C6-7 Lab data: Sodium 137, potassium 4.6, CO2 22, BUN 14, creatinine 1.38, glucose 376, alkaline phosphatase 154, albumin 2.4, AST and ALT normal, total protein 6, total bilirubin normal, BNP 277, poc troponin 0.00, white count 15,200 with neutrophils 89% and salute neutrophils 13.5%, hemoglobin 13.8, platelets 340,000, urinalysis unremarkable except for greater than 1000 of glucose, 15 ketones Medications and treatments: Normal saline bolus 1 L, Percocet 5/325 one tablet 1, Mylicon 80 mg 1 tablet 1, aspirin 81 mg 1, Plavix 75 mg  1, Lexapro 20 mg 1, Protonix 40 mg 1, Cozaar 25 mg 1, Niferex 150 mg 1, Lantus 8 units subcutaneous 1, hydro-Diuril 12.5 mg 1, regular insulin 5 units subcutaneous 1, Vantin 200 mg 1 Antivert 25 mg 1, NovoLog 10 units IV 1  Review of Systems:  In addition to the HPI above,  No Headache, changes with Vision or hearing, new  weakness, tingling, numbness in any extremity, dizziness, dysarthria or word finding difficulty, gait disturbance or imbalance, tremors or seizure activity No problems swallowing food or Liquids, indigestion/reflux, choking or coughing while eating No Chest pain, Cough or Shortness of Breath, palpitations, orthopnea or DOE No N/V, melena,hematochezia, dark tarry stools, constipation No dysuria, malodorous urine, hematuria or flank pain No new skin rashes, masses or bruises, No new joint pains, aches, swelling or redness No recent unintentional weight gain or loss No polyuria, polydypsia or polyphagia   Past Medical History:  Diagnosis Date  . Allergic rhinitis   . Anxiety   . Benign hypertension with CKD (chronic kidney disease) stage III   . Benign paroxysmal positional vertigo   . Bipolar disorder ([place])   . Broken finger   . Broken shoulder   . Broken toes   . Cervicalgia   . CHF (congestive heart failure) ([place]) 07/2016  . CKD (chronic kidney disease) stage 3, GFR 30-59 ml/min   . Depression   . Diabetes mellitus without complication ([place])   . Elevated liver enzymes Hep B/C neg 2014  . GERD (gastroesophageal reflux disease)   . Glaucoma   . History of alcohol use   . Hypertension   . Hypothyroidism   . Interstitial cystitis    bladder stretched every 9 months  . Migraines   . Psoriasis   . Stroke ([place]) 05/16/2016   Left occipital and thalamic, right hippocampal  . Thyroid disease   . Tobacco use   . 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  Vitamin B12 deficiency     Past Surgical History:  Procedure Laterality Date  . ABDOMINAL HYSTERECTOMY     with oophorectomy  . APPENDECTOMY    . BLADDER SURGERY    . CATARACT EXTRACTION    . CERVICAL DISC SURGERY    . CHOLECYSTECTOMY    . HERNIA REPAIR    . TONSILLECTOMY      Social History   Social History  . Marital status: Married    Spouse name: N/A  . Number of children: N/A  . Years of  education: N/A   Occupational History  . Not on file.   Social History Main Topics  . Smoking status: Current Every Day Smoker    Packs/day: 0.25    Types: Cigarettes  . Smokeless tobacco: Never Used  . Alcohol use No     Comment: Hasn't had any alcohol since last admission  . Drug use: No  . Sexual activity: Not on file   Other Topics Concern  . Not on file   Social History Narrative   Patient has been married for 13 years and lives with spouse, has 2 children, 3 grandchildren, and 1 great grandchild. She does not use walker/cane, does not clean house, does not shop, does not do yard work, and does not drive. She is retired. Previous occupation was Product manager. Completed [employer].         Epwoth Sleepiness Scale Score:  7      --I have high blood pressure   --I have had insomnia   --I seem to be losing my sex drive   --I feel stressed and lack motivation   --I wake up to urinate frequently at night   --I wake up with a dry mouth or sore throat   --I feel excessively sleepy and tired throughout the day   --I have Diabetes   --I have been told that I snore   --I have problems with memory/concentration   --I frequently awake with headaches        Mobility: Rolling walker Work history: Not obtained   Allergies  Allergen Reactions  . Alprazolam Other (See Comments)    Family preference, for patient to not take med  . Percocet [Oxycodone-Acetaminophen] Other (See Comments)    Family preference, for patient to not take med  . Codeine Diarrhea and Nausea And Vomiting  . Doxycycline Diarrhea and Nausea And Vomiting  . Hydrocodone Nausea And Vomiting  . Omnicef [Cefdinir] Nausea Only and Other (See Comments)    Constipation, tolerated Zosyn  . Augmentin [Amoxicillin-Pot Clavulanate] Hives and Rash    Has patient had a PCN reaction causing immediate rash, facial/tongue/throat swelling, SOB or lightheadedness with hypotension: Yes Has patient had a PCN  reaction causing severe rash involving mucus membranes or skin necrosis: Yes Did a PCN reaction that required hospitalization No Did PCN reaction occurring within the last 10 years: Yes If all of the above answers are "NO", then may proceed with Cephalosporin use.  Pt states she has taken penicillin since, and was ok with it...  . Ciprofloxacin Hives    Family History  Problem Relation Age of Onset  . Alcohol abuse Mother   . Arthritis Mother   . Asthma Mother   . Cancer Mother     colon cancer  . Hypertension Mother   . Migraines Mother   . Stroke Mother   . Lung disease Mother   . COPD Mother   . Diabetes Father   .  Hypertension Father   . Heart disease Father   . Heart attack Father   . Heart disease Paternal Grandmother   . Diabetes Paternal Grandmother   . Stroke Paternal Grandmother   . Cancer Paternal Grandmother   . Diabetes Paternal Grandfather      Prior to Admission medications   Medication Sig Start Date End Date Taking? Authorizing Provider  amLODipine (NORVASC) 5 MG tablet Take 5 mg by mouth daily.   Yes Historical Provider, MD  aspirin 81 MG chewable tablet Chew 1 tablet (81 mg total) by mouth daily. 09/24/16  Yes Barton Dubois, MD  atorvastatin (LIPITOR) 10 MG tablet Take 1 tablet (10 mg total) by mouth daily at 6 PM. 05/19/16  Yes Lavina Hamman, MD  carvedilol (COREG) 3.125 MG tablet Take 1 tablet (3.125 mg total) by mouth 2 (two) times daily with a meal. 07/18/16  Yes Mauricio Gerome Apley, MD  clopidogrel (PLAVIX) 75 MG tablet Take 75 mg by mouth daily.   Yes Historical Provider, MD  escitalopram (LEXAPRO) 20 MG tablet Take 20 mg by mouth at bedtime.    Yes Historical Provider, MD  HUMALOG 100 UNIT/ML injection Inject 0.05 mLs (5 Units total) into the skin 3 (three) times daily with meals. Patient taking differently: Inject 0-5 Units into the skin 3 (three) times daily with meals. Sliding scale 09/23/16  Yes Barton Dubois, MD  hydrochlorothiazide  (HYDRODIURIL) 25 MG tablet Take 0.5 tablets (12.5 mg total) by mouth daily. Patient taking differently: Take 25 mg by mouth daily.  09/28/16  Yes Daleen Bo, MD  iron polysaccharides (NIFEREX) 150 MG capsule Take 1 capsule (150 mg total) by mouth 2 (two) times daily. 06/28/16  Yes Barton Dubois, MD  levothyroxine (SYNTHROID, LEVOTHROID) 100 MCG tablet Take 1 tablet (100 mcg total) by mouth daily before breakfast. 10/10/16  Yes Donne Hazel, MD  losartan (COZAAR) 25 MG tablet Take 1 tablet (25 mg total) by mouth daily. 09/23/16  Yes Barton Dubois, MD  meclizine (ANTIVERT) 25 MG tablet Take 1 tablet (25 mg total) by mouth 3 (three) times daily as needed for dizziness. 02/12/16  Yes Skeet Latch, MD  pantoprazole (PROTONIX) 40 MG tablet Take 40 mg by mouth daily.   Yes Historical Provider, MD  polyethylene glycol (MIRALAX / GLYCOLAX) packet Take 17 g by mouth every other day. Patient taking differently: Take 17 g by mouth daily as needed for mild constipation.  05/19/16  Yes Lavina Hamman, MD  senna-docusate (SENOKOT-S) 8.6-50 MG tablet Take 1 tablet by mouth daily. Patient taking differently: Take 1 tablet by mouth at bedtime as needed for mild constipation.  09/18/16  Yes Margette Fast, MD  cefpodoxime (VANTIN) 200 MG tablet Take 1 tablet (200 mg total) by mouth every 12 (twelve) hours. Patient not taking: Reported on 10/16/2016 10/09/16   Donne Hazel, MD  LORazepam (ATIVAN) 0.5 MG tablet Take 1 tablet (0.5 mg total) by mouth every 8 (eight) hours as needed for anxiety. Patient not taking: Reported on 10/16/2016 05/19/16   Lavina Hamman, MD    Physical Exam: Vitals:   10/17/16 0700 10/17/16 0730 10/17/16 0745 10/17/16 0815  BP: 156/67 151/72 160/78 152/74  Pulse: 73 76 76 73  Resp:      Temp:      TempSrc:      SpO2: 96% 96% 95% 95%  Weight:      Height:          Constitutional: NAD, calm, uncomfortable relation to  persistent feeling of coldness Eyes: PERRL, lids and conjunctivae  normal-note eyebrows are very thin and essentially nonexistent, face overall is puffy ENMT: Mucous membranes are very dry. Posterior pharynx clear of any exudate or lesions.Normal dentition.  Neck: normal, supple, no masses, no appreciable thyromegaly Respiratory: clear to auscultation bilaterally, no wheezing, no crackles-decreased in bases. Normal respiratory effort. No accessory muscle use.  Cardiovascular: Regular rate and rhythm, no murmurs / rubs / gallops. No lower extremity edema. billateral hands puffy, 2+ pedal pulses. No carotid bruits.  Abdomen: Diffusely tender without guarding or rebounding, no masses palpated. No hepatosplenomegaly. Bowel sounds positive. Loose mucoid green stools noted in bedpan Musculoskeletal: no clubbing / cyanosis. No joint deformity upper and lower extremities. Good ROM, no contractures. Normal muscle tone.  Skin: no rashes, ulcers. No induration-1.5 cm circumferential soft papular or nodular lesion on dorsum right hand that appears fluctuant that I'm unable to express any drainage and is nontender and is very reddened-she confirms previous he had IV in this area-no streaking and no palpable cording Neurologic: CN 2-12 grossly intact. Sensation intact, DTR equivocal. Strength 5/5 x all 4 extremities.  Psychiatric: Mildly confused. Alert and oriented x name and place-having some difficulty with short-term memory recall. Anxious mood.    Labs on Admission: I have personally reviewed following labs and imaging studies  CBC:  Recent Labs Lab 10/16/16 1055 10/17/16 0725  WBC 15.2* 9.3  NEUTROABS 13.5*  --   HGB 13.8 9.9*  HCT 40.6 29.7*  MCV 89.4 88.1  PLT 340 302   Basic Metabolic Panel:  Recent Labs Lab 10/16/16 1055 10/17/16 0725  NA 137 136  K 4.6 3.5  CL 103 106  CO2 22 25  GLUCOSE 376* 147*  BUN 14 17  CREATININE 1.38* 1.31*  CALCIUM 8.8* 7.5*   GFR: Estimated Creatinine Clearance: 32.1 mL/min (by C-G formula based on SCr of 1.31 mg/dL  (H)). Liver Function Tests:  Recent Labs Lab 10/16/16 1055  AST 33  ALT 39  ALKPHOS 154*  BILITOT 0.7  PROT 6.0*  ALBUMIN 2.4*   No results for input(s): LIPASE, AMYLASE in the last 168 hours. No results for input(s): AMMONIA in the last 168 hours. Coagulation Profile: No results for input(s): INR, PROTIME in the last 168 hours. Cardiac Enzymes: No results for input(s): CKTOTAL, CKMB, CKMBINDEX, TROPONINI in the last 168 hours. BNP (last 3 results) No results for input(s): PROBNP in the last 8760 hours. HbA1C: No results for input(s): HGBA1C in the last 72 hours. CBG:  Recent Labs Lab 10/16/16 1036 10/16/16 2035 10/17/16 0003  GLUCAP 369* 527* 400*   Lipid Profile: No results for input(s): CHOL, HDL, LDLCALC, TRIG, CHOLHDL, LDLDIRECT in the last 72 hours. Thyroid Function Tests:  Recent Labs  10/16/16 1055  TSH 135.864*  FREET4 1.18*   Anemia Panel: No results for input(s): VITAMINB12, FOLATE, FERRITIN, TIBC, IRON, RETICCTPCT in the last 72 hours. Urine analysis:    Component Value Date/Time   COLORURINE YELLOW 10/16/2016 1106   APPEARANCEUR CLEAR 10/16/2016 1106   APPEARANCEUR Clear 06/15/2014 1508   LABSPEC 1.016 10/16/2016 1106   LABSPEC 1.010 06/15/2014 1508   PHURINE 6.0 10/16/2016 1106   GLUCOSEU >1000 (A) 10/16/2016 1106   GLUCOSEU Negative 06/15/2014 1508   HGBUR TRACE (A) 10/16/2016 1106   BILIRUBINUR NEGATIVE 10/16/2016 1106   BILIRUBINUR Negative 06/15/2014 1508   KETONESUR 15 (A) 10/16/2016 1106   PROTEINUR >300 (A) 10/16/2016 1106   UROBILINOGEN 0.2 09/14/2015 1617   NITRITE NEGATIVE  10/16/2016 1106   LEUKOCYTESUR NEGATIVE 10/16/2016 1106   LEUKOCYTESUR Negative 06/15/2014 1508   Sepsis Labs: '@LABRCNTIP'$ (procalcitonin:4,lacticidven:4) )No results found for this or any previous visit (from the past 240 hour(s)).   Radiological Exams on Admission: Dg Chest 2 View  Result Date: 10/16/2016 CLINICAL DATA:  Chest pain, diarrhea for 1 week  EXAM: CHEST  2 VIEW COMPARISON:  10/05/2016 FINDINGS: Cardiomediastinal silhouette is stable. There is small bilateral pleural effusion. Bilateral basilar atelectasis or infiltrate. Small fluid is noted in right fissure. No pulmonary edema. Mild degenerative changes thoracic spine. IMPRESSION: Small bilateral pleural effusion. Bilateral basilar atelectasis or infiltrate. No pulmonary edema. Electronically Signed   By: Lahoma Crocker M.D.   On: 10/16/2016 11:38   Ct Head Wo Contrast  Addendum Date: 10/16/2016   ADDENDUM REPORT: 10/16/2016 12:37 ADDENDUM: The original report was by Dr. Van Clines. The following addendum is by Dr. Van Clines: These results were called by telephone at the time of interpretation on 10/16/2016 at 12:31 pm to Dr. Brantley Stage , who verbally acknowledged these results. Electronically Signed   By: Van Clines M.D.   On: 10/16/2016 12:37   Result Date: 10/16/2016 CLINICAL DATA:  Fall this past Sunday. Posterior headache. Progressive weakness. Diabetes. EXAM: CT HEAD WITHOUT CONTRAST CT CERVICAL SPINE WITHOUT CONTRAST TECHNIQUE: Multidetector CT imaging of the head and cervical spine was performed following the standard protocol without intravenous contrast. Multiplanar CT image reconstructions of the cervical spine were also generated. COMPARISON:  09/06/2016 FINDINGS: CT HEAD FINDINGS Brain: Remote 4 mm lacunar infarct in the left cerebral peduncle extending into the left thalamus. Periventricular white matter and corona radiata hypodensities favor chronic ischemic microvascular white matter disease. Prior graft otherwise, The brainstem, cerebellum, cerebral peduncles, thalami, basal ganglia, basilar cisterns, and ventricular system appear within normal limits. No intracranial hemorrhage, mass lesion, or acute CVA. Vascular: There is atherosclerotic calcification of the cavernous carotid arteries bilaterally. Skull: Unremarkable Sinuses/Orbits: Mild chronic ethmoid,  maxillary, and sphenoid sinusitis. Hypoplastic frontal sinuses. Other: Right posterior parietal scalp soft tissue swelling. CT CERVICAL SPINE FINDINGS Alignment: 2.5 mm degenerative anterolisthesis at C4-5, not changed from prior. Skull base and vertebrae: Fracture through the base of the anterior osteophyte at the left anterior C6 vertebral level as on image 55/12. Interval coronal vertically oriented fracture through the anterior portion of the C7 vertebral body as shown on images 25 through 36 series 11, separating the anterior vertebral body and bridging anterior osteophytes from the posterior vertebral body. At T1, there is a fracture extending through the anterior osteophytes along the anterior cortical margin of the vertebral, with some cortical involvement for example on image 33/11. On the prior exam these osteophytes were essentially incorporated into the anterior vertebral body. Fracture through the base of the anterior osteophyte at T2 along the superior endplate. No posterior element involvement identified. ACDF at C5-6. Soft tissues and spinal canal: Mild stranding along the left lower carotid space likely related to the fractures. Disc levels: At C2-3 there is a posterior osteophyte causing mild central narrowing of the thecal sac. Uncinate and facet spurring cause right foraminal impingement at C3-4 and to a lesser degree at C4-5 and C6-7 ; and left foraminal stenosis at C2-3. Probable disc bulge at the C6-7 level. Upper chest: Bilateral pleural effusions. Other: Old deformity of the right medial clavicle. IMPRESSION: 1. New acute anterior fractures at C6, C7, T1, and T2. At the C7 level the fracture represents a coronally oriented fracture through the anterior vertebral body. At  the other levels, the fracture represents vertically oriented coronal fractures extending through the anterior bridging osteophytes. There is adjacent soft tissue edema more notably tracking in the left lower carotid space.  No acute subluxation. No posterior element involvement. 2. Chronic 2.5 mm of degenerative anterolisthesis at C4-5. ACDF at C5-6. 3. Old fracture of the right medial clavicle, healed, with secondary degenerative right sternoclavicular arthropathy. 4. Bilateral pleural effusions. 5. Cervical spondylosis and degenerative disc disease causing impingement at C2- 3, C3-4, C4-5, and C6-7. 6. Intracranially, there is evidence of chronic microvascular white matter disease and a remote lacunar infarct involving the left thalamus and left cerebral peduncle, but no acute intracranial findings. 7. Chronic paranasal sinusitis. 8. Right posterior parietal scalp soft tissue swelling. Radiology assistant personnel have been notified to put me in telephone contact with the referring physician or the referring physician's clinical representative in order to discuss these findings. Once this communication is established I will issue an addendum to this report for documentation purposes. Electronically Signed: By: Gaylyn Rong M.D. On: 10/16/2016 12:29   Ct Cervical Spine Wo Contrast  Addendum Date: 10/16/2016   ADDENDUM REPORT: 10/16/2016 12:37 ADDENDUM: The original report was by Dr. Gaylyn Rong. The following addendum is by Dr. Gaylyn Rong: These results were called by telephone at the time of interpretation on 10/16/2016 at 12:31 pm to Dr. Crista Curb , who verbally acknowledged these results. Electronically Signed   By: Gaylyn Rong M.D.   On: 10/16/2016 12:37   Result Date: 10/16/2016 CLINICAL DATA:  Fall this past Sunday. Posterior headache. Progressive weakness. Diabetes. EXAM: CT HEAD WITHOUT CONTRAST CT CERVICAL SPINE WITHOUT CONTRAST TECHNIQUE: Multidetector CT imaging of the head and cervical spine was performed following the standard protocol without intravenous contrast. Multiplanar CT image reconstructions of the cervical spine were also generated. COMPARISON:  09/06/2016 FINDINGS: CT HEAD  FINDINGS Brain: Remote 4 mm lacunar infarct in the left cerebral peduncle extending into the left thalamus. Periventricular white matter and corona radiata hypodensities favor chronic ischemic microvascular white matter disease. Prior graft otherwise, The brainstem, cerebellum, cerebral peduncles, thalami, basal ganglia, basilar cisterns, and ventricular system appear within normal limits. No intracranial hemorrhage, mass lesion, or acute CVA. Vascular: There is atherosclerotic calcification of the cavernous carotid arteries bilaterally. Skull: Unremarkable Sinuses/Orbits: Mild chronic ethmoid, maxillary, and sphenoid sinusitis. Hypoplastic frontal sinuses. Other: Right posterior parietal scalp soft tissue swelling. CT CERVICAL SPINE FINDINGS Alignment: 2.5 mm degenerative anterolisthesis at C4-5, not changed from prior. Skull base and vertebrae: Fracture through the base of the anterior osteophyte at the left anterior C6 vertebral level as on image 55/12. Interval coronal vertically oriented fracture through the anterior portion of the C7 vertebral body as shown on images 25 through 36 series 11, separating the anterior vertebral body and bridging anterior osteophytes from the posterior vertebral body. At T1, there is a fracture extending through the anterior osteophytes along the anterior cortical margin of the vertebral, with some cortical involvement for example on image 33/11. On the prior exam these osteophytes were essentially incorporated into the anterior vertebral body. Fracture through the base of the anterior osteophyte at T2 along the superior endplate. No posterior element involvement identified. ACDF at C5-6. Soft tissues and spinal canal: Mild stranding along the left lower carotid space likely related to the fractures. Disc levels: At C2-3 there is a posterior osteophyte causing mild central narrowing of the thecal sac. Uncinate and facet spurring cause right foraminal impingement at C3-4 and to a  lesser degree  at C4-5 and C6-7 ; and left foraminal stenosis at C2-3. Probable disc bulge at the C6-7 level. Upper chest: Bilateral pleural effusions. Other: Old deformity of the right medial clavicle. IMPRESSION: 1. New acute anterior fractures at C6, C7, T1, and T2. At the C7 level the fracture represents a coronally oriented fracture through the anterior vertebral body. At the other levels, the fracture represents vertically oriented coronal fractures extending through the anterior bridging osteophytes. There is adjacent soft tissue edema more notably tracking in the left lower carotid space. No acute subluxation. No posterior element involvement. 2. Chronic 2.5 mm of degenerative anterolisthesis at C4-5. ACDF at C5-6. 3. Old fracture of the right medial clavicle, healed, with secondary degenerative right sternoclavicular arthropathy. 4. Bilateral pleural effusions. 5. Cervical spondylosis and degenerative disc disease causing impingement at C2- 3, C3-4, C4-5, and C6-7. 6. Intracranially, there is evidence of chronic microvascular white matter disease and a remote lacunar infarct involving the left thalamus and left cerebral peduncle, but no acute intracranial findings. 7. Chronic paranasal sinusitis. 8. Right posterior parietal scalp soft tissue swelling. Radiology assistant personnel have been notified to put me in telephone contact with the referring physician or the referring physician's clinical representative in order to discuss these findings. Once this communication is established I will issue an addendum to this report for documentation purposes. Electronically Signed: By: Van Clines M.D. On: 10/16/2016 12:29    EKG: (Independently reviewed) sinus rhythm with a ventricular rate 84 bpm, QTC 460 ms, no ischemic changes appreciated  Assessment/Plan Principal Problem:   Acute kidney injury/Chronic kidney disease stage III -Presents with ongoing diarrhea and volume loss, poor oral intake as  well as hyperglycemia related volume losses with mild acute kidney injury -Baseline renal function 20/1.15 with continuing renal function 14/1.38 -Continue gentle IV fluid hydration -Hold offending medications including diuretics/ARB/no NSAIDs -Follow chemistries  Active Problems:   Insulin dependent type 2 diabetes mellitus, uncontrolled -Presented yesterday with hyperglycemia CBGs greater than 300 and greater than 500 overnight -Possibly precipitated by combination of dehydration and suspected underlying infectious processes -Continue preadmission Lantus-consider readjusting dosage during admission  -Follow CBGs and provide SS I -Hemoglobin A1c on 09/20/16 was 9.2    Uncontrolled Hypothyroidism -According to daughter, patient has been on generic Synthroid for unknown period of time and at last admission presented with a TSH of 89 with a low T4 -TSH repeated while in the ER and has increased further to 135 with a free T4 of 1.18 -Given diarrhea and possible acute malabsorptive issues we will utilize IV Synthroid at half of oral dose -Unclear if patient's outpatient insurance will pay for branded Synthroid so have asked case management to determine if preauthorization indicated -Would recommend to not check additional thyroid function studies for at least 6 weeks  -Patient reports a subjective sensation of being very cold    Recent UTI (urinary tract infection)/Recent Gram-negative bacteremia (Enterobacter) -Repeat urinalysis unremarkable and has completed Vantin -Presents with leukocytosis probably from dehydration and other sources of active infection but for completeness of exam will repeat urine culture and obtain follow-up blood cultures    Diarrhea -Given recent antibiotics with hospitalization as well as brief stay at skilled nursing facility need to rule out C. difficile colitis checking PCR -Enteric isolation -IV Flagyl for now -ESR and CRP -Procalcitonin -Was on PPI prior to  admission-is continuing favor of IV Pepcid until C. difficile status clarified    Hypertension -Moderately controlled -Continue preadmission Norvasc and carvedilol but hold high due  to diarrheal -After hydration creatinine still is slightly elevated at 131 so we'll go ahead and hold preadmission Cozaar for now -prn hydralazine with parameters    Lesion dorsum right hand -Localized lesion with redness and fluctuance at site of apparent previous IV -No streaking or other signs of cellulitis and no palpable cording concerning for superficial thrombophlebitis -Given concerns over possible C. difficile colitis will not start antibiotics and we'll continue to watch this area -May benefit from local I/D if enlarges further    Physical deconditioning -PT/OT evaluation -Per my discussion with daughter [name], family is okay with patient discharging to a skilled nursing facility to the facility she was at previously-they have given a list of preferred facilities to the ER social worker    Left ventricular diastolic dysfunction, NYHA class 1 -Currently compensated without evidence of heart failure    Cervical vertebral fracture/Thoracic vertebral fracture/Fall at nursing home -EDP spoke with neurosurgery who stated these fractures do not require surgical intervention and neurosurgeon recommended hard cervical collar with later follow-up as an outpatient with neurosurgery -Since on clear liquids will hold oral pain medications in favor of IV -No NSAIDs secondary to acute kidney injury on chronic kidney disease stage III    Dilated cbd, acquired -During previous admission had abdominal ultrasound that revealed a 10 mm dilated common bile duct and at that time had abnormal transaminases -As evaluated by [facility] with recommendations to follow-up in one week and obtain an outpatient MRCP (see discharge summary) -Patient presents with diarrhea and diffuse abdominal pain not consistent with biliary  etiology, current LFTs are within normal limits except for mildly elevated alkaline phosphatase -Consider obtaining MRCP while inpatient    Benign paroxysmal positional vertigo -Continue prn Antivert    Anxiety -Continue preadmission Lexapro and Xanax    History of CVA (cerebrovascular accident) -Continue aspirin and Plavix -Hold Lipitor while on clear liquids and while having diarrhea    HLD (hyperlipidemia) -Holding statin as above    Anemia of chronic disease -Presented with a hemoglobin of 13 with a baseline of around 9.7-10 concerning for hemoconcentration from dehydration -After given fluids in the ER hemoglobin down to 9.9 -Was on oral iron prior to admission but there are so expressed concerns by the family of possible malabsorption issues and with acute diarrhea could have acute malabsorption therefore we'll check anemia panel and a significantly low on iron may benefit from IV iron prior to discharge    Protein-calorie malnutrition, severe -Nutrition consultation -Pre-albumin      DVT prophylaxis: Lovenox Code Status: Full Family Communication: Spoke with one of patient's daughters as documented above Disposition Plan: Anticipate discharge to a new skilled nursing facility once medically stable Consults called: Neurosurgery for telephone consultation by EDP    Starleen Trussell L. ANP-BC [facility] Pager [phone]   If 7PM-7AM, please contact night-coverage [email or web address] Password [facility]  10/17/2016, 8:46 AM

## 2016-10-17 NOTE — ED Notes (Signed)
PT/OT at bedside.

## 2016-10-18 ENCOUNTER — Inpatient Hospital Stay (HOSPITAL_COMMUNITY): Payer: Medicare Other

## 2016-10-18 DIAGNOSIS — S12000D Unspecified displaced fracture of first cervical vertebra, subsequent encounter for fracture with routine healing: Secondary | ICD-10-CM

## 2016-10-18 DIAGNOSIS — E1165 Type 2 diabetes mellitus with hyperglycemia: Secondary | ICD-10-CM | POA: Diagnosis not present

## 2016-10-18 DIAGNOSIS — N179 Acute kidney failure, unspecified: Secondary | ICD-10-CM | POA: Diagnosis not present

## 2016-10-18 DIAGNOSIS — D638 Anemia in other chronic diseases classified elsewhere: Secondary | ICD-10-CM | POA: Diagnosis not present

## 2016-10-18 DIAGNOSIS — R197 Diarrhea, unspecified: Secondary | ICD-10-CM | POA: Diagnosis not present

## 2016-10-18 DIAGNOSIS — A0472 Enterocolitis due to Clostridium difficile, not specified as recurrent: Secondary | ICD-10-CM | POA: Diagnosis not present

## 2016-10-18 DIAGNOSIS — H8110 Benign paroxysmal vertigo, unspecified ear: Secondary | ICD-10-CM | POA: Diagnosis not present

## 2016-10-18 DIAGNOSIS — E034 Atrophy of thyroid (acquired): Secondary | ICD-10-CM | POA: Diagnosis not present

## 2016-10-18 LAB — CBC
HCT: 25.6 % — ABNORMAL LOW (ref 36.0–46.0)
HEMOGLOBIN: 8.5 g/dL — AB (ref 12.0–15.0)
MCH: 30 pg (ref 26.0–34.0)
MCHC: 33.2 g/dL (ref 30.0–36.0)
MCV: 90.5 fL (ref 78.0–100.0)
Platelets: 258 10*3/uL (ref 150–400)
RBC: 2.83 MIL/uL — AB (ref 3.87–5.11)
RDW: 14.3 % (ref 11.5–15.5)
WBC: 8.7 10*3/uL (ref 4.0–10.5)

## 2016-10-18 LAB — COMPREHENSIVE METABOLIC PANEL
ALT: 19 U/L (ref 14–54)
ANION GAP: 6 (ref 5–15)
AST: 18 U/L (ref 15–41)
Albumin: 1.4 g/dL — ABNORMAL LOW (ref 3.5–5.0)
Alkaline Phosphatase: 85 U/L (ref 38–126)
BUN: 14 mg/dL (ref 6–20)
CHLORIDE: 109 mmol/L (ref 101–111)
CO2: 24 mmol/L (ref 22–32)
Calcium: 7.3 mg/dL — ABNORMAL LOW (ref 8.9–10.3)
Creatinine, Ser: 1.13 mg/dL — ABNORMAL HIGH (ref 0.44–1.00)
GFR calc non Af Amer: 48 mL/min — ABNORMAL LOW (ref >60)
GFR, EST AFRICAN AMERICAN: 56 mL/min — AB (ref >60)
Glucose, Bld: 109 mg/dL — ABNORMAL HIGH (ref 65–99)
POTASSIUM: 3.6 mmol/L (ref 3.5–5.1)
SODIUM: 139 mmol/L (ref 135–145)
Total Bilirubin: 0.1 mg/dL — ABNORMAL LOW (ref 0.3–1.2)
Total Protein: 3.5 g/dL — ABNORMAL LOW (ref 6.5–8.1)

## 2016-10-18 LAB — GLUCOSE, CAPILLARY
GLUCOSE-CAPILLARY: 154 mg/dL — AB (ref 65–99)
GLUCOSE-CAPILLARY: 32 mg/dL — AB (ref 65–99)
GLUCOSE-CAPILLARY: 106 mg/dL — AB (ref 65–99)
Glucose-Capillary: 254 mg/dL — ABNORMAL HIGH (ref 65–99)
Glucose-Capillary: 29 mg/dL — CL (ref 65–99)
Glucose-Capillary: 32 mg/dL — CL (ref 65–99)

## 2016-10-18 LAB — URINE CULTURE: CULTURE: NO GROWTH

## 2016-10-18 MED ORDER — INSULIN GLARGINE 100 UNIT/ML ~~LOC~~ SOLN
4.0000 [IU] | Freq: Every day | SUBCUTANEOUS | Status: DC
  Administered 2016-10-19: 4 [IU] via SUBCUTANEOUS
  Filled 2016-10-18 (×2): qty 0.04

## 2016-10-18 MED ORDER — VANCOMYCIN 50 MG/ML ORAL SOLUTION
125.0000 mg | Freq: Four times a day (QID) | ORAL | Status: DC
  Administered 2016-10-18 – 2016-10-23 (×17): 125 mg via ORAL
  Filled 2016-10-18 (×26): qty 2.5

## 2016-10-18 MED ORDER — LEVOTHYROXINE SODIUM 100 MCG PO TABS
100.0000 ug | ORAL_TABLET | Freq: Every day | ORAL | Status: DC
  Administered 2016-10-19 – 2016-10-23 (×5): 100 ug via ORAL
  Filled 2016-10-18 (×5): qty 1

## 2016-10-18 MED ORDER — DEXTROSE 50 % IV SOLN: 1.0000 | INTRAVENOUS | Status: DC | PRN

## 2016-10-18 MED ORDER — DEXTROSE 50 % IV SOLN
INTRAVENOUS | Status: AC
  Administered 2016-10-18: 25 mL
  Filled 2016-10-18: qty 50

## 2016-10-18 MED ORDER — GLUCERNA SHAKE PO LIQD
237.0000 mL | Freq: Three times a day (TID) | ORAL | Status: DC
  Administered 2016-10-18 – 2016-10-22 (×10): 237 mL via ORAL
  Filled 2016-10-18: qty 237

## 2016-10-18 MED ORDER — DEXTROSE 50 % IV SOLN: 1.0000 | Freq: Once | INTRAVENOUS | Status: DC

## 2016-10-18 NOTE — Progress Notes (Signed)
Initial Nutrition Assessment  DOCUMENTATION CODES:   Not applicable  INTERVENTION:  Provide Glucerna Shake po TID, each supplement provides 220 kcal and 10 grams of protein.  Encourage adequate PO intake.   NUTRITION DIAGNOSIS:   Inadequate oral intake related to poor appetite as evidenced by per patient/family report.  GOAL:   Patient will meet greater than or equal to 90% of their needs  MONITOR:   PO intake, Supplement acceptance, Labs, Weight trends, Skin, I & O's  REASON FOR ASSESSMENT:   Consult Assessment of nutrition requirement/status  ASSESSMENT:   70 y.o. female with medical history significant for hypothyroidism previously on generic Synthroid, diabetes on insulin, history of prior CVA, chronic kidney disease stage III, known left ventricular diastolic dysfunction, anemia chronic disease, anxiety and depression, apparent history of prior alcohol use, BPV, dyslipidemia and protein calorie malnutrition. Presents with increased extremity edema, uncontrolled diabetes and diarrhea  Diet has been advanced to a carbohydrate modified diet. Pt reports hunger improving during time of visit. Pt reports however over the past 3 week have a decreased appetite and reports she has only been consuming soups (vegetable soups). No weight loss observed from weight records. Pt does reports consuming protein shakes however has only been drinking them once daily. Pt is agreeable to RD ordering nutritional supplements to aid in caloric and protein needs.   Nutrition-Focused physical exam completed. Findings are no fat depletion, moderate muscle depletion, and mild edema.   Labs and medications reviewed. CBG's 80-254 mg/dL.  Diet Order:  Diet Carb Modified Fluid consistency: Thin; Room service appropriate? Yes  Skin:  Reviewed, no issues  Last BM:  11/2  Height:   Ht Readings from Last 1 Encounters:  10/16/16 5\' 2"  (1.575 m)    Weight:   Wt Readings from Last 1 Encounters:   10/17/16 129 lb 13.6 oz (58.9 kg)    Ideal Body Weight:  50 kg  BMI:  Body mass index is 23.75 kg/m.  Estimated Nutritional Needs:   Kcal:  1500-1700  Protein:  60-75 grams  Fluid:  >/= 1.5 L/day  EDUCATION NEEDS:   No education needs identified at this time  Roslyn SmilingStephanie Kathrin Folden, MS, RD, LDN Pager # [phone] After hours/ weekend pager # [phone]

## 2016-10-18 NOTE — Progress Notes (Signed)
[facility] PROGRESS NOTE  Cassandra AbbeBillie A [name] [id number] DOB: Mar 29, 1946 DOA: 10/16/2016  PCP: Irving CopasHACKER,ROBERT KELLER, MD  Brief History/Interval Summary: 70 year old Caucasian female with a past medical history of hypothyroidism, diabetes, history of prior CVA, chronic kidney disease stage III, anemia of chronic disease with recent hospitalization for Enterobacter UTI with associated bacteremia. She was discharged to skilled nursing facility. Apparently she was being discharged from there back home when she had a fall. She was brought into the emergency department and found to have cervical vertebrae fracture. She was also found to have acute renal failure. She also mentioned watery diarrhea. She was hospitalized for further management.  Reason for Visit: Acute renal failure. C. difficile  Consultants: Phone discussion with neurosurgery  Procedures: None  Antibiotics: Oral vancomycin  Subjective/Interval History: Patient states that she feels better this morning. Continues to have pain in her neck. Denies any abdominal pain or nausea or vomiting. Has had loose stools, but unable to tell me when was her last episode.  ROS: Denies any chest pain or shortness of breath  Objective:  Vital Signs  Vitals:   10/17/16 1718 10/17/16 2043 10/18/16 0510 10/18/16 0943  BP: (!) 159/67 (!) 146/64 (!) 162/73 (!) 158/78  Pulse: 75 64 75 81  Resp: 16 18 19 20   Temp: 98.1 F (36.7 C) 99 F (37.2 C) 99 F (37.2 C) 99 F (37.2 C)  TempSrc: Oral   Oral  SpO2: 98% 96% 97% 98%  Weight:  58.9 kg (129 lb 13.6 oz)    Height:        Intake/Output Summary (Last 24 hours) at 10/18/16 1343 Last data filed at 10/18/16 0945  Gross per 24 hour  Intake          1668.75 ml  Output                0 ml  Net          1668.75 ml   Filed Weights   10/16/16 0959 10/17/16 1532 10/17/16 2043  Weight: 56.7 kg (125 lb) 58.1 kg (128 lb 1.4 oz) 58.9 kg (129 lb 13.6 oz)    General appearance:  alert, cooperative, appears stated age and no distress Resp: clear to auscultation bilaterally Cardio: regular rate and rhythm, S1, S2 normal, no murmur, click, rub or gallop GI: soft, non-tender; bowel sounds normal; no masses,  no organomegaly Extremities: extremities normal, atraumatic, no cyanosis or edema Neurologic: Awake and alert. Mildly distracted and confused. No focal neurological deficits. She does have a cervical neck collar.  Lab Results:  Data Reviewed: I have personally reviewed following labs and imaging studies  CBC:  Recent Labs Lab 10/16/16 1055 10/17/16 0725 10/18/16 0316  WBC 15.2* 9.3 8.7  NEUTROABS 13.5*  --   --   HGB 13.8 9.9* 8.5*  HCT 40.6 29.7* 25.6*  MCV 89.4 88.1 90.5  PLT 340 302 258    Basic Metabolic Panel:  Recent Labs Lab 10/16/16 1055 10/17/16 0725 10/18/16 0316  NA 137 136 139  K 4.6 3.5 3.6  CL 103 106 109  CO2 22 25 24   GLUCOSE 376* 147* 109*  BUN 14 17 14   CREATININE 1.38* 1.31* 1.13*  CALCIUM 8.8* 7.5* 7.3*    GFR: Estimated Creatinine Clearance: 37.2 mL/min (by C-G formula based on SCr of 1.13 mg/dL (H)).  Liver Function Tests:  Recent Labs Lab 10/16/16 1055 10/18/16 0316  AST 33 18  ALT 39 19  ALKPHOS 154* 85  BILITOT 0.7  0.1*  PROT 6.0* 3.5*  ALBUMIN 2.4* 1.4*    CBG:  Recent Labs Lab 10/17/16 0003 10/17/16 1208 10/17/16 1658 10/17/16 2155 10/18/16 1119  GLUCAP 400* 100* 92 80 254*    Thyroid Function Tests:  Recent Labs  10/16/16 1055  TSH 135.864*  FREET4 1.18*    Anemia Panel:  Recent Labs  10/17/16 0909  VITAMINB12 1,085*  FOLATE 19.6  FERRITIN 211  TIBC 189*  IRON 56  RETICCTPCT 1.5    Recent Results (from the past 240 hour(s))  Culture, Urine     Status: None   Collection Time: 10/16/16 11:06 AM  Result Value Ref Range Status   Specimen Description URINE, RANDOM  Final   Special Requests ADDED [id number]  Final   Culture NO GROWTH  Final   Report Status 10/18/2016  FINAL  Final  C difficile quick screen w PCR reflex     Status: Abnormal   Collection Time: 10/17/16  8:08 AM  Result Value Ref Range Status   C Diff antigen POSITIVE (A) NEGATIVE Final   C Diff toxin NEGATIVE NEGATIVE Final   C Diff interpretation Results are indeterminate. See PCR results.  Final  Clostridium Difficile by PCR     Status: Abnormal   Collection Time: 10/17/16  8:08 AM  Result Value Ref Range Status   Toxigenic C Difficile by pcr POSITIVE (A) NEGATIVE Final    Comment: Positive for toxigenic C. difficile with little to no toxin production. Only treat if clinical presentation suggests symptomatic illness.      Radiology Studies: Mr Abdomen Mrcp Wo Contrast  Result Date: 10/18/2016 CLINICAL DATA:  Status post cholecystectomy with dilated CBD EXAM: MRI ABDOMEN WITHOUT CONTRAST  (INCLUDING MRCP) TECHNIQUE: Multiplanar multisequence MR imaging of the abdomen was performed. Heavily T2-weighted images of the biliary and pancreatic ducts were obtained, and three-dimensional MRCP images were rendered by post processing. COMPARISON:  Right upper ultrasound dated 10/08/2016. CT abdomen pelvis dated 09/18/2016. FINDINGS: Motion degraded images. Lower chest: Lung bases are clear. Hepatobiliary: Liver is poorly evaluated due to motion degradation and artifact. No focal hepatic lesion is seen. Status post cholecystectomy. No intrahepatic duct dilatation. Dilated CBD, measuring 11 mm (series 5/ image 18), and smoothly tapering at the ampulla. No choledocholithiasis is seen. Pancreas:  Motion degraded but grossly unremarkable. Spleen:  Within normal limits. Adrenals/Urinary Tract:  Adrenal glands are grossly unremarkable. Kidneys are grossly unremarkable.  No hydronephrosis. Stomach/Bowel: Stomach is within normal limits. Visualized bowel is unremarkable. Vascular/Lymphatic:  No evidence of abdominal aortic aneurysm. No suspicious abdominal lymphadenopathy. Other:  Moderate abdominal ascites.  Musculoskeletal: No focal osseous lesion. IMPRESSION: Status post cholecystectomy. Common duct measures 11 mm and smoothly tapers at the ampulla, likely postsurgical. No choledocholithiasis is seen. Electronically Signed   By: Charline Bills M.D.   On: 10/18/2016 10:09     Medications:  Scheduled: . amLODipine  5 mg Oral Daily  . aspirin  81 mg Oral Daily  . carvedilol  3.125 mg Oral BID WC  . clopidogrel  75 mg Oral Daily  . enoxaparin (LOVENOX) injection  40 mg Subcutaneous Q24H  . escitalopram  20 mg Oral Daily  . famotidine (PEPCID) IV  20 mg Intravenous Q12H  . insulin aspart  0-5 Units Subcutaneous QHS  . insulin aspart  0-9 Units Subcutaneous TID WC  . insulin glargine  8 Units Subcutaneous Daily  . [START ON 10/19/2016] levothyroxine  100 mcg Oral QAC breakfast  . vancomycin  125 mg Oral  QID   Continuous: . sodium chloride 75 mL/hr at 10/17/16 1013   ZOX:[id number] **OR** acetaminophen, hydrALAZINE, LORazepam, meclizine, morphine injection, ondansetron **OR** ondansetron (ZOFRAN) IV  Assessment/Plan:  Principal Problem:   Acute kidney injury (HCC) Active Problems:   Insulin dependent type 2 diabetes mellitus, uncontrolled (HCC)   Hypertension   Uncontrolled Hypothyroidism   Benign paroxysmal positional vertigo   Anxiety   History of CVA (cerebrovascular accident)   HLD (hyperlipidemia)   Anemia of chronic disease   Physical deconditioning   Left ventricular diastolic dysfunction, NYHA class 1   Recent UTI (urinary tract infection)   Protein-calorie malnutrition, severe   Recent Gram-negative bacteremia   Cervical vertebral fracture (HCC)   Thoracic vertebral fracture (HCC)   Fall at nursing home   Diarrhea   Common bile duct dilatation    Acute kidney injury/Chronic kidney disease stage III Patient presented with ongoing diarrhea and volume loss, poor oral intake as well as hyperglycemia related volume losses with mild acute kidney injury. Patient was  gently hydrated. Renal function has improved and now close to baseline. Hold offending medications including diuretics/ARB/no NSAIDs. Follow chemistries  Acute diarrhea with the equivocal C. difficile results C. difficile antigen was positive and toxin was negative. Positive PCR. Since patient was having diarrhea and was in the hospital recently and was on antibiotics, it may be reasonable to consider this an acute infection and treat with oral vancomycin. This has been initiated. Continue to monitor clinically.  Insulin dependent type 2 diabetes mellitus, uncontrolled Presented with hyperglycemia with CBGs greater than 300 and greater than 500 overnight. Possibly precipitated by combination of dehydration and suspected underlying infectious processes. Continue Lantus. Continue with sliding scale coverage. Hemoglobin A1c on 09/20/16 was 9.2  Uncontrolled Hypothyroidism According to daughter, patient has been on generic Synthroid for unknown period of time and at last admission presented with a TSH of 89 with a low T4. TSH repeated while in the ER and has increased further to 135 with a free T4 of 1.18. Free T4 had shown improvement compared to previous levels. Continue with Synthroid 100 g daily. Will need repeat thyroid function tests only in 4-6 weeks. May benefit from outpatient endocrinology follow-up.  Recent UTI (urinary tract infection)/Recent Gram-negative bacteremia (Enterobacter) Repeat urinalysis unremarkable and has completed Vantin. Leukocytosis, resolved. Urine cultures and blood cultures negative so far.  Essential Hypertension Continue to monitor blood pressures closely. Holding Cozaar due to renal failure.  Lesion dorsum right hand Localized lesion with redness and fluctuance at site of apparent previous IV. No streaking or other signs of cellulitis and no palpable cording concerning for superficial thrombophlebitis. Warm compresses.  Physical deconditioning PT/OT  evaluation. May need to return to a skilled nursing facility.  Left ventricular diastolic dysfunction, NYHA class 1 Currently compensated without evidence of heart failure  Cervical vertebral fracture/Thoracic vertebral fracture/Fall at nursing home EDP spoke with neurosurgery who stated these fractures do not require surgical intervention and neurosurgeon recommended hard cervical collar with later follow-up as an outpatient with neurosurgery. Outpatient follow-up with Dr. Jordan Likes who has previously operated on this patient.  Dilated CBD During previous admission had abdominal ultrasound that revealed a 10 mm dilated common bile duct and at that time had abnormal transaminases. As evaluated by [facility] with recommendations to follow-up in one week and obtain an outpatient MRCP (see discharge summary). MRCP was done this morning and shows mildly dilated CBD without any choledocholithiasis or other abnormalities. Thought to be related to previous cholecystectomy.  Benign paroxysmal positional vertigo Continue prn Antivert  Anxiety Continue preadmission Lexapro and Xanax  History of CVA (cerebrovascular accident) Continue aspirin and Plavix.   HLD (hyperlipidemia) Okay to resume statin at discharge.  Anemia of chronic disease Presented with a hemoglobin of 13 with a baseline of around 9.7-10 concerning for hemoconcentration from dehydration. After given fluids in the ER hemoglobin down to 9.9. Was on oral iron prior to admission , which can be resumed at discharge. Ferritin level 211.  Protein-calorie malnutrition, severe Nutrition consultation.   DVT Prophylaxis: Lovenox    Code Status: Full code  Family Communication: Discussed with the patient. No family at bedside  Disposition Plan: Management as outlined above. Wait for diarrhea to subside. Wait for PT evaluation. Anticipate discharge in next 1-2 days.   LOS: 1 day   [facility] MercyKRISHNAN,Zackari Ruane  [facility] Pager  [phone] 10/18/2016, 1:43 PM  If 7PM-7AM, please contact night-coverage at [email or web address], password [email or web address]

## 2016-10-18 NOTE — Progress Notes (Signed)
Occupational Therapy Note  OT evaluation completed, full eval write up to follow.  She demonstrates significant visual deficits and difficulty negotiating obstacles.  She requires min - mod A for functional mobility and max A for ADLs. Recommend SNF.   Jeani HawkingWendi Jayton Popelka, OTR/L [phone]

## 2016-10-19 DIAGNOSIS — R197 Diarrhea, unspecified: Secondary | ICD-10-CM | POA: Diagnosis not present

## 2016-10-19 DIAGNOSIS — Z794 Long term (current) use of insulin: Secondary | ICD-10-CM

## 2016-10-19 DIAGNOSIS — E1165 Type 2 diabetes mellitus with hyperglycemia: Secondary | ICD-10-CM | POA: Diagnosis not present

## 2016-10-19 DIAGNOSIS — A0472 Enterocolitis due to Clostridium difficile, not specified as recurrent: Secondary | ICD-10-CM | POA: Diagnosis not present

## 2016-10-19 DIAGNOSIS — D638 Anemia in other chronic diseases classified elsewhere: Secondary | ICD-10-CM | POA: Diagnosis not present

## 2016-10-19 DIAGNOSIS — N179 Acute kidney failure, unspecified: Secondary | ICD-10-CM | POA: Diagnosis not present

## 2016-10-19 LAB — BASIC METABOLIC PANEL
Anion gap: 5 (ref 5–15)
BUN: 14 mg/dL (ref 6–20)
CHLORIDE: 109 mmol/L (ref 101–111)
CO2: 22 mmol/L (ref 22–32)
CREATININE: 1.31 mg/dL — AB (ref 0.44–1.00)
Calcium: 7.4 mg/dL — ABNORMAL LOW (ref 8.9–10.3)
GFR calc non Af Amer: 41 mL/min — ABNORMAL LOW (ref >60)
GFR, EST AFRICAN AMERICAN: 47 mL/min — AB (ref >60)
Glucose, Bld: 126 mg/dL — ABNORMAL HIGH (ref 65–99)
Potassium: 4 mmol/L (ref 3.5–5.1)
SODIUM: 136 mmol/L (ref 135–145)

## 2016-10-19 LAB — CBC
HCT: 26.2 % — ABNORMAL LOW (ref 36.0–46.0)
HEMOGLOBIN: 8.7 g/dL — AB (ref 12.0–15.0)
MCH: 30 pg (ref 26.0–34.0)
MCHC: 33.2 g/dL (ref 30.0–36.0)
MCV: 90.3 fL (ref 78.0–100.0)
Platelets: 249 10*3/uL (ref 150–400)
RBC: 2.9 MIL/uL — AB (ref 3.87–5.11)
RDW: 14.1 % (ref 11.5–15.5)
WBC: 7.9 10*3/uL (ref 4.0–10.5)

## 2016-10-19 LAB — GLUCOSE, CAPILLARY
GLUCOSE-CAPILLARY: 144 mg/dL — AB (ref 65–99)
GLUCOSE-CAPILLARY: 169 mg/dL — AB (ref 65–99)
GLUCOSE-CAPILLARY: 97 mg/dL (ref 65–99)
Glucose-Capillary: 108 mg/dL — ABNORMAL HIGH (ref 65–99)
Glucose-Capillary: 109 mg/dL — ABNORMAL HIGH (ref 65–99)
Glucose-Capillary: 122 mg/dL — ABNORMAL HIGH (ref 65–99)
Glucose-Capillary: 70 mg/dL (ref 65–99)
Glucose-Capillary: 83 mg/dL (ref 65–99)

## 2016-10-19 LAB — PROCALCITONIN: PROCALCITONIN: 1.15 ng/mL

## 2016-10-19 MED ORDER — FAMOTIDINE 20 MG PO TABS
20.0000 mg | ORAL_TABLET | Freq: Every day | ORAL | Status: DC
  Administered 2016-10-20 – 2016-10-23 (×4): 20 mg via ORAL
  Filled 2016-10-19 (×4): qty 1

## 2016-10-19 MED ORDER — DEXTROSE-NACL 5-0.45 % IV SOLN
INTRAVENOUS | Status: DC
  Administered 2016-10-19 (×2): via INTRAVENOUS

## 2016-10-19 MED ORDER — MORPHINE SULFATE (PF) 2 MG/ML IV SOLN
2.0000 mg | INTRAVENOUS | Status: DC | PRN
  Administered 2016-10-19 – 2016-10-22 (×6): 2 mg via INTRAVENOUS
  Filled 2016-10-19 (×6): qty 1

## 2016-10-19 NOTE — Progress Notes (Signed)
[facility] PROGRESS NOTE  Cassandra [name] XBJ:[id number] DOB: [birth year] DOA: 10/16/2016  PCP: Irving Copas, MD  Brief History/Interval Summary: 70 year old Caucasian female with a past medical history of hypothyroidism, diabetes, history of prior CVA, chronic kidney disease stage III, anemia of chronic disease with recent hospitalization for Enterobacter UTI with associated bacteremia. She was discharged to skilled nursing facility. Apparently she was being discharged from there back home when she had a fall. She was brought into the emergency department and found to have cervical vertebrae fracture. She was also found to have acute renal failure. She also mentioned watery diarrhea. She was hospitalized for further management.  Reason for Visit: Acute renal failure. C. difficile  Consultants: Phone discussion with neurosurgery  Procedures: None  Antibiotics: Oral vancomycin  Subjective/Interval History: Patient developed low blood sugars last night. Seems to be doing better otherwise. States that her diarrhea is much improved. Continues to have some pain in her head and neck due to the fall.   ROS: Denies any chest pain or shortness of breath  Objective:  Vital Signs  Vitals:   10/18/16 2015 10/18/16 2120 10/19/16 0440 10/19/16 0931  BP:  (!) 144/60 140/60 (!) 170/68  Pulse: 69 71 72 76  Resp:  19 18 17   Temp:  97.7 F (36.5 C) 98.9 F (37.2 C) 99 F (37.2 C)  TempSrc:  Oral Oral Oral  SpO2:  95% 95% 96%  Weight:  59.1 kg (130 lb 4.7 oz)    Height:        Intake/Output Summary (Last 24 hours) at 10/19/16 1327 Last data filed at 10/19/16 1228  Gross per 24 hour  Intake            842.5 ml  Output              200 ml  Net            642.5 ml   Filed Weights   10/17/16 1532 10/17/16 2043 10/18/16 2120  Weight: 58.1 kg (128 lb 1.4 oz) 58.9 kg (129 lb 13.6 oz) 59.1 kg (130 lb 4.7 oz)    General appearance: alert, cooperative, appears stated age  and no distress Resp: clear to auscultation bilaterally Cardio: regular rate and rhythm, S1, S2 normal, no murmur, click, rub or gallop GI: soft, non-tender; bowel sounds normal; no masses,  no organomegaly Neurologic: Awake and alert. Mildly distracted. No focal neurological deficits. She does have a cervical neck collar.  Lab Results:  Data Reviewed: I have personally reviewed following labs and imaging studies  CBC:  Recent Labs Lab 10/16/16 1055 10/17/16 0725 10/18/16 0316 10/19/16 0614  WBC 15.2* 9.3 8.7 7.9  NEUTROABS 13.5*  --   --   --   HGB 13.8 9.9* 8.5* 8.7*  HCT 40.6 29.7* 25.6* 26.2*  MCV 89.4 88.1 90.5 90.3  PLT 340 302 258 249    Basic Metabolic Panel:  Recent Labs Lab 10/16/16 1055 10/17/16 0725 10/18/16 0316 10/19/16 0614  NA 137 136 139 136  K 4.6 3.5 3.6 4.0  CL 103 106 109 109  CO2 22 25 24 22   GLUCOSE 376* 147* 109* 126*  BUN 14 17 14 14   CREATININE 1.38* 1.31* 1.13* 1.31*  CALCIUM 8.8* 7.5* 7.3* 7.4*    GFR: Estimated Creatinine Clearance: 32.1 mL/min (by C-G formula based on SCr of 1.31 mg/dL (H)).  Liver Function Tests:  Recent Labs Lab 10/16/16 1055 10/18/16 0316  AST 33 18  ALT 39 19  ALKPHOS 154* 85  BILITOT 0.7 0.1*  PROT 6.0* 3.5*  ALBUMIN 2.4* 1.4*    CBG:  Recent Labs Lab 10/19/16 0110 10/19/16 0216 10/19/16 0316 10/19/16 0802 10/19/16 1205  GLUCAP 83 109* 122* 144* 169*     Anemia Panel:  Recent Labs  10/17/16 0909  VITAMINB12 1,085*  FOLATE 19.6  FERRITIN 211  TIBC 189*  IRON 56  RETICCTPCT 1.5    Recent Results (from the past 240 hour(s))  Culture, Urine     Status: None   Collection Time: 10/16/16 11:06 AM  Result Value Ref Range Status   Specimen Description URINE, RANDOM  Final   Special Requests ADDED [id number]  Final   Culture NO GROWTH  Final   Report Status 10/18/2016 FINAL  Final  C difficile quick screen w PCR reflex     Status: Abnormal   Collection Time: 10/17/16  8:08 AM    Result Value Ref Range Status   C Diff antigen POSITIVE (A) NEGATIVE Final   C Diff toxin NEGATIVE NEGATIVE Final   C Diff interpretation Results are indeterminate. See PCR results.  Final  Clostridium Difficile by PCR     Status: Abnormal   Collection Time: 10/17/16  8:08 AM  Result Value Ref Range Status   Toxigenic C Difficile by pcr POSITIVE (A) NEGATIVE Final    Comment: Positive for toxigenic C. difficile with little to no toxin production. Only treat if clinical presentation suggests symptomatic illness.  Culture, blood (Routine X 2) w Reflex to ID Panel     Status: None (Preliminary result)   Collection Time: 10/17/16  9:00 AM  Result Value Ref Range Status   Specimen Description BLOOD RIGHT HAND  Final   Special Requests IN PEDIATRIC BOTTLE 1CC  Final   Culture NO GROWTH 2 DAYS  Final   Report Status PENDING  Incomplete  Culture, blood (Routine X 2) w Reflex to ID Panel     Status: None (Preliminary result)   Collection Time: 10/17/16  9:12 AM  Result Value Ref Range Status   Specimen Description BLOOD WRIST LEFT  Final   Special Requests IN PEDIATRIC BOTTLE 2CC  Final   Culture NO GROWTH 2 DAYS  Final   Report Status PENDING  Incomplete      Radiology Studies: Mr Abdomen Mrcp Wo Contrast  Result Date: 10/18/2016 CLINICAL DATA:  Status post cholecystectomy with dilated CBD EXAM: MRI ABDOMEN WITHOUT CONTRAST  (INCLUDING MRCP) TECHNIQUE: Multiplanar multisequence MR imaging of the abdomen was performed. Heavily T2-weighted images of the biliary and pancreatic ducts were obtained, and three-dimensional MRCP images were rendered by post processing. COMPARISON:  Right upper ultrasound dated 10/08/2016. CT abdomen pelvis dated 09/18/2016. FINDINGS: Motion degraded images. Lower chest: Lung bases are clear. Hepatobiliary: Liver is poorly evaluated due to motion degradation and artifact. No focal hepatic lesion is seen. Status post cholecystectomy. No intrahepatic duct dilatation.  Dilated CBD, measuring 11 mm (series 5/ image 18), and smoothly tapering at the ampulla. No choledocholithiasis is seen. Pancreas:  Motion degraded but grossly unremarkable. Spleen:  Within normal limits. Adrenals/Urinary Tract:  Adrenal glands are grossly unremarkable. Kidneys are grossly unremarkable.  No hydronephrosis. Stomach/Bowel: Stomach is within normal limits. Visualized bowel is unremarkable. Vascular/Lymphatic:  No evidence of abdominal aortic aneurysm. No suspicious abdominal lymphadenopathy. Other:  Moderate abdominal ascites. Musculoskeletal: No focal osseous lesion. IMPRESSION: Status post cholecystectomy. Common duct measures 11 mm and smoothly tapers at the ampulla, likely postsurgical. No choledocholithiasis is seen. Electronically Signed  By: Charline BillsSriyesh  Demetruis Depaul M.D.   On: 10/18/2016 10:09     Medications:  Scheduled: . amLODipine  5 mg Oral Daily  . aspirin  81 mg Oral Daily  . carvedilol  3.125 mg Oral BID WC  . clopidogrel  75 mg Oral Daily  . enoxaparin (LOVENOX) injection  40 mg Subcutaneous Q24H  . escitalopram  20 mg Oral Daily  . [START ON 10/20/2016] famotidine  20 mg Oral Daily  . feeding supplement (GLUCERNA SHAKE)  237 mL Oral TID BM  . insulin aspart  0-5 Units Subcutaneous QHS  . insulin aspart  0-9 Units Subcutaneous TID WC  . insulin glargine  4 Units Subcutaneous Daily  . levothyroxine  100 mcg Oral QAC breakfast  . vancomycin  125 mg Oral QID   Continuous: . sodium chloride 10 mL/hr (10/18/16 1400)  . dextrose 5 % and 0.45% NaCl 50 mL/hr at 10/19/16 0700   ZOX:[id number]:acetaminophen **OR** acetaminophen, dextrose, hydrALAZINE, LORazepam, meclizine, morphine injection, ondansetron **OR** ondansetron (ZOFRAN) IV  Assessment/Plan:  Principal Problem:   C. difficile diarrhea Active Problems:   Insulin dependent type 2 diabetes mellitus, uncontrolled (HCC)   Hypertension   Uncontrolled Hypothyroidism   Benign paroxysmal positional vertigo   Anxiety   History  of CVA (cerebrovascular accident)   HLD (hyperlipidemia)   Anemia of chronic disease   Physical deconditioning   Left ventricular diastolic dysfunction, NYHA class 1   Recent UTI (urinary tract infection)   Protein-calorie malnutrition, severe   Recent Gram-negative bacteremia   Acute kidney injury (HCC)   Cervical vertebral fracture (HCC)   Thoracic vertebral fracture (HCC)   Fall at nursing home   Common bile duct dilatation    Acute kidney injury/Chronic kidney disease stage III Patient presented with ongoing diarrhea and volume loss, poor oral intake as well as hyperglycemia related volume losses with mild acute kidney injury. Patient was gently hydrated. Renal function has improved and now close to baseline. Hold offending medications including diuretics/ARB/no NSAIDs. Monitor urine output.  Acute diarrhea with the equivocal C. difficile results C. difficile antigen was positive and toxin was negative. Positive PCR. Since patient was having diarrhea and was in the hospital recently and was on antibiotics, it may be reasonable to consider this an acute infection and treat with oral vancomycin. This has been initiated. Patient is responding to treatment. Diarrhea has improved.  Insulin dependent type 2 diabetes mellitus, uncontrolled Presented with hyperglycemia with CBGs greater than 300 and greater than 500 overnight. Possibly precipitated by combination of dehydration and suspected underlying infectious processes. She was started back on her Lantus. She did develop hypoglycemia last night. Dose of Lantus has been reduced. Currently on D5 infusion. Hopefully, we can titrate this off soon. Poor oral intake may have contributed. Continue with sliding scale coverage. Hemoglobin A1c on 09/20/16 was 9.2  Uncontrolled Hypothyroidism According to daughter, patient has been on generic Synthroid for unknown period of time and at last admission presented with a TSH of 89 with a low T4. TSH  repeated while in the ER and has increased further to 135 with a free T4 of 1.18. Free T4 had shown improvement compared to previous levels. Continue with Synthroid 100 g daily. Will need repeat thyroid function tests only in 4-6 weeks. May benefit from outpatient endocrinology follow-up.  Recent UTI (urinary tract infection)/Recent Gram-negative bacteremia (Enterobacter) Repeat urinalysis unremarkable and has completed Vantin. Leukocytosis, resolved. Urine cultures and blood cultures negative so far.  Essential Hypertension Continue to monitor blood  pressures closely. Holding Cozaar due to renal failure.  Lesion dorsum right hand Localized lesion with redness and fluctuance at site of apparent previous IV. No streaking or other signs of cellulitis and no palpable cording concerning for superficial thrombophlebitis. Warm compresses.  Physical deconditioning PT/OT evaluation. Will need to return to a skilled nursing facility.  Left ventricular diastolic dysfunction, NYHA class 1 Currently compensated without evidence of heart failure  Cervical vertebral fracture/Thoracic vertebral fracture/Fall at nursing home EDP spoke with neurosurgery who stated these fractures do not require surgical intervention and neurosurgeon recommended hard cervical collar with later follow-up as an outpatient with neurosurgery. Outpatient follow-up with Dr. Jordan LikesPool who has previously operated on this patient.  Dilated CBD During previous admission had abdominal ultrasound that revealed a 10 mm dilated common bile duct and at that time had abnormal transaminases. [facility] with recommendations to follow-up in one week and obtain an outpatient MRCP (see discharge summary). MRCP was done this morning and shows mildly dilated CBD without any choledocholithiasis or other abnormalities. Thought to be related to previous cholecystectomy.  Benign paroxysmal positional vertigo Continue prn  Antivert  Anxiety Continue preadmission Lexapro and Xanax  History of CVA (cerebrovascular accident) Continue aspirin and Plavix.   HLD (hyperlipidemia) Okay to resume statin at discharge.  Anemia of chronic disease Presented with a hemoglobin of 13 with a baseline of around 9.7-10 concerning for hemoconcentration from dehydration. After given fluids in the ER hemoglobin down to 9.9. Was on oral iron prior to admission , which can be resumed at discharge. Ferritin level 211.  Protein-calorie malnutrition, severe Nutrition consultation.   DVT Prophylaxis: Lovenox    Code Status: Full code  Family Communication: Discussed with the patient. No family at bedside  Disposition Plan: Management as outlined above. Await stabilization of blood glucose levels. Will need to go to skilled nursing facility when ready for discharge. Anticipate early next week.   LOS: 2 days   [facility] Park[facility]burgKRISHNAN,Blaine Hari  [facility] Pager [phone] 10/19/2016, 1:27 PM  If 7PM-7AM, please contact night-coverage at [email or web address], password [email or web address]

## 2016-10-19 NOTE — Progress Notes (Signed)
Patient had hypoglycemic episode initial blood sugar was 32, slightly symptomatic patient sleepy easy to arouse, pt. Stated some shakiness. Given carbs and  tolerated po rechecked blood sugar x2  Still low . Pt. Awake denies any symptoms given 1/2 amp of D50 IV. Leighton ParodyK. KirbyNP was notified. With order to check sugar every hour x2 if <100 may give another 1/2 amp of D50. Blood sugar was up to 106 after first dose of D50-2725ml.. Will continue to monitor.

## 2016-10-19 NOTE — Progress Notes (Signed)
Occupational Therapy Evaluation (late entry)  Pt presents to OT with the below listed deficits.  She requires max A, overall for ADLs and min - mod A for functional mobility.  Visual deficits limit mobility profoundly as she frequently runs into objects, and is unable to negotiate RW through her room without significant assistance which places her at high fall risk.  On review of her chart and therapy notes during her last several admissions, it appears that vision may be worse as her ability to visually negotiate her environment was much better previously.  She does have h/o glaucoma, and per pt she has been planning to see her ophthalmologist, but has had to frequently cancel appointments due to hospital admissions. She will need 24 hour assist at discharge, likely will require SNF.   10/18/16 1600  OT Visit Information  Last OT Received On 10/18/16  Assistance Needed +2 (follow with chair )  History of Present Illness pt presents with C6, C7, T1, and T2 fxs after a fall.  pt with hx of DM, CVA, BPPV, HTN, CKD, Anxiety, Depression, bipolar, and hx Etoh.    Precautions  Precautions Fall  Precaution Comments multiple falls   Home Living  Family/patient expects to be discharged to: Skilled nursing facility  Living Arrangements Alone  Available Help at Discharge Family;Available 24 hours/day  Type of Home Apartment  Home Access Level entry  Home Layout One level  Bathroom Shower/Tub Tub/shower unit  Shower/tub characteristics Curtain  Horticulturist, commercialBathroom Toilet Standard  Bathroom Accessibility Yes  How Accessible Accessible via walker  Home Equipment Walker - 2 wheels;Shower seat  Additional Comments Husband does all cooking, cleaning and laundry as well as grocery shopping.   Prior Function  Level of Independence Needs assistance  Gait / Transfers Assistance Needed walks with RW in house  ADL's / Homemaking Assistance Needed Pt reports spouse assists with LB ADLs, and IADLs   Comments No family  present during eval.  Pt indicates frequent falls   Communication  Communication No difficulties  Pain Assessment  Pain Assessment Faces  Faces Pain Scale 2  Pain Location generalized   Pain Descriptors / Indicators Grimacing  Pain Intervention(s) Monitored during session  Cognition  Arousal/Alertness Lethargic  Behavior During Therapy Flat affect  Overall Cognitive Status Impaired/Different from baseline  Area of Impairment Orientation;Attention;Memory;Following commands;Safety/judgement;Problem solving  Orientation Level Disoriented to;Time;Situation  Current Attention Level Sustained (With cues )  Memory Decreased short-term memory  Following Commands Follows one step commands with increased time (with cues )  Safety/Judgement Decreased awareness of safety;Decreased awareness of deficits  Problem Solving Slow processing;Decreased initiation;Difficulty sequencing;Requires verbal cues;Requires tactile cues  General Comments Pt states it's 1977.  She requires mod cues for sequencing and problem solving. Demonstrates poor safety awareness  Upper Extremity Assessment  Upper Extremity Assessment Generalized weakness;RUE deficits/detail;LUE deficits/detail  RUE Deficits / Details grossly 4-/5  LUE Deficits / Details grossly 4-/5  Lower Extremity Assessment  Lower Extremity Assessment Generalized weakness  Cervical / Trunk Assessment  Cervical / Trunk Assessment Kyphotic  ADL  Overall ADL's  Needs assistance/impaired  Eating/Feeding Independent  Grooming Wash/dry hands;Wash/dry face;Oral care;Brushing hair;Minimal assistance;Sitting  Upper Body Bathing Moderate assistance;Sitting  Lower Body Bathing Maximal assistance;Sit to/from stand  Upper Body Dressing  Maximal assistance;Sitting  Lower Body Dressing Total assistance;Sit to/from stand  Toilet Transfer Moderate assistance;Ambulation;Comfort height toilet;Grab bars;RW  StatisticianToilet Transfer Details (indicate cue type and reason) Pt  requires cues to negotiate through tight spaces   Toileting- ArchitectClothing Manipulation and Hygiene  Total assistance;Sit to/from stand  Functional mobility during ADLs Moderate assistance;Minimal assistance;Rolling walker  General ADL Comments visual deficits and lethargy impact performance   Vision- History  Baseline Vision/History Wears glasses;Glaucoma  Wears Glasses At all times  Patient Visual Report Peripheral vision impairment;Blurring of vision  Vision- Assessment  Vision Assessment? Yes  Additional Comments Pt unable to track objects.  She is noted to frequently run into items in her environment - more prevalent on the Lt side.  Confrontation testing reveals impaired Lt fields both eyes, with sparring in the mid Lt periphery, appears to have central field deficit, as well as Rt peripheral deficits.   She reports things are "dark"    Perception  Comments difficult to accurately assess due to lethargy and cognitive deficits   Bed Mobility  Overal bed mobility Needs Assistance  Bed Mobility Rolling;Sidelying to Sit  Rolling Min assist  Sidelying to sit Mod assist  General bed mobility comments mod - max cues to initiate task.  Assist to roll to Rt and to lift shoulders from bed   Transfers  Overall transfer level Needs assistance  Equipment used Rolling walker (2 wheeled)  Transfers Sit to/from BJ'sStand;Stand Pivot Transfers  Sit to Stand Min assist  Stand pivot transfers Min assist  General transfer comment cues for hand placement and balance   Balance  Overall balance assessment Needs assistance  Sitting-balance support Feet supported  Sitting balance-Leahy Scale Fair  Standing balance support Bilateral upper extremity supported  Standing balance-Leahy Scale Poor  Standing balance comment min A   OT - End of Session  Equipment Utilized During Treatment Rolling walker;Gait belt  Activity Tolerance Patient limited by lethargy  Patient left in chair;with call bell/phone within  reach;with chair alarm set  Nurse Communication Mobility status  OT Assessment  OT Recommendation/Assessment Patient needs continued OT Services  OT Problem List Decreased strength;Decreased activity tolerance;Impaired balance (sitting and/or standing);Impaired vision/perception;Decreased cognition;Decreased safety awareness;Decreased knowledge of use of DME or AE;Decreased knowledge of precautions  Barriers to Discharge Decreased caregiver support  OT Plan  OT Frequency (ACUTE ONLY) Min 2X/week  OT Treatment/Interventions (ACUTE ONLY) Self-care/ADL training;DME and/or AE instruction;Therapeutic activities;Patient/family education;Balance training;Visual/perceptual remediation/compensation  OT Recommendation  Follow Up Recommendations SNF;Supervision/Assistance - 24 hour  OT Equipment 3 in 1 bedside comode;Tub/shower bench  Individuals Consulted  Consulted and Agree with Results and Recommendations Patient  Acute Rehab OT Goals  Patient Stated Goal go home   OT Goal Formulation With patient  Time For Goal Achievement 11/02/16  Potential to Achieve Goals Good  OT Time Calculation  OT Start Time (ACUTE ONLY) 1529  OT Stop Time (ACUTE ONLY) 1557  OT Time Calculation (min) 28 min  OT General Charges  $OT Visit 1 Procedure  OT Evaluation  $OT Eval Moderate Complexity 1 Procedure  OT Treatments  $Therapeutic Activity 8-22 mins  Written Expression  Dominant Hand Right  Jeani HawkingWendi Glorene Leitzke, OTR/L [phone]

## 2016-10-19 NOTE — Progress Notes (Signed)
Patient blood sugar was 70 at midnight, given some juice peanut butter with crackers. Patient noted po intake. Blood sugar was 83.  Dr. Dolores HooseMerrellMD was notified with orders made.  IVf started and blood sugar will check every hour x2  Until >100 and back to ac and hs.

## 2016-10-19 NOTE — Clinical Social Work Placement (Signed)
   CLINICAL SOCIAL WORK PLACEMENT  NOTE  Date:  10/19/2016  Patient Details  Name: Cassandra AbbeBillie A Pipe MRN: [id number] Date of Birth: Aug 13, 1946  Clinical Social Work is seeking post-discharge placement for this patient at the Skilled  Nursing Facility level of care (*CSW will initial, date and re-position this form in  chart as items are completed):  Yes   Patient/family provided with [facility] Clinical Social Work Department's list of facilities offering this level of care within the geographic area requested by the patient (or if unable, by the patient's family).  Yes   Patient/family informed of their freedom to choose among providers that offer the needed level of care, that participate in Medicare, Medicaid or managed care program needed by the patient, have an available bed and are willing to accept the patient.  Yes   Patient/family informed of [facility] ownership interest [facility], as well as of the fact that they are under no obligation to receive care at these facilities.  PASRR submitted to EDS on       PASRR number received on       Existing PASRR number confirmed on 10/19/16     FL2 transmitted to all facilities in geographic area requested by pt/family on 10/16/16     FL2 transmitted to all facilities within larger geographic area on       Patient informed that his/her managed care company has contracts with or will negotiate with certain facilities, including the following:            Patient/family informed of bed offers received.  Patient chooses bed at       Physician recommends and patient chooses bed at      Patient to be transferred to   on  .  Patient to be transferred to facility by       Patient family notified on   of transfer.  Name of family member notified:        PHYSICIAN       Additional Comment:    _______________________________________________ Marnee SpringGerber, Tiajuana Leppanen Marie, LCSW 10/19/2016, 10:15 AM

## 2016-10-19 NOTE — Progress Notes (Signed)
Hypoglycemia noted. On SSI and lantus. Start D5 1/2ns at 50cc/hr. CBG checks Q1hr until >100 x2 then return to previous protocol

## 2016-10-19 NOTE — Clinical Social Work Note (Addendum)
Clinical Social Work Assessment  Patient Details  Name: Cassandra [name] MRN: [id number] Date of Birth: December 30, 1945  Date of referral:  10/19/16               Reason for consult:  Discharge Planning                Permission sought to share information with:  Chartered certified accountant granted to share information::  No  Name::        Agency::     Relationship::     Contact Information:     Housing/Transportation Living arrangements for the past 2 months:  Single Family Home Source of Information:  Patient Patient Interpreter Needed:  None Criminal Activity/Legal Involvement Pertinent to Current Situation/Hospitalization:  No - Comment as needed Significant Relationships:  Adult Children Lives with:  Spouse Do you feel safe going back to the place where you live?  Yes Need for family participation in patient care:  No (Coment)  Care giving concerns:  Pt needs SNF placement.   Social Worker assessment / plan:  Pt and family had spoken with another CSW re: SNF placement. Pt and family were agreeable and preferred placement in [facility]  CSW met with pt in room. No visitors were present. Pt stated that she has a dtr in [place] and one in [place]. SW provided bed offers for both counties. Pt reports that dtrs plan to come to visit this afternoon. CSW offered to call dtrs but she reported they are familiar with process and will make a decision this weekend about placement.  Employment status:  Retired Nurse, adult PT Recommendations:  [place] / Referral to community resources:  [place]  Patient/Family's Response to care:  Pt agreeable to SNF placement.  Patient/Family's Understanding of and Emotional Response to Diagnosis, Current Treatment, and Prognosis:  Pt was calm with a flat demeanor. Pt wants to discuss plans with family when they visit this afternoon. Pt asked  appropriate questions.  Emotional Assessment Appearance:  Appears older than stated age Attitude/Demeanor/Rapport:  Other (Flat) Affect (typically observed):  Calm Orientation:  Oriented to Self, Oriented to Place, Oriented to  Time, Oriented to Situation Alcohol / Substance use:  Not Applicable Psych involvement (Current and /or in the community):  No (Comment)  Discharge Needs  Concerns to be addressed:  No discharge needs identified Readmission within the last 30 days:  Yes Current discharge risk:  None Barriers to Discharge:  No Barriers Identified   Boone Master, [place] 10/19/2016, 10:18 AM Weekend Coverage   10/19/16 1410:  MD called reporting that dtr Lexine Baton) wanted to speak with CSW. CSW spoke with pt about family being unable to visit and wanted CSW to contact her via phone and pt was agreeable. CSW and dtr spoke about bed offers and dtr would prefer [facility] or [facility] out of offers. Dtr plans to visit facilities and will alert CSW on Monday of her choice. Dtr had questions about Medicaid and CSW referred her to the Department of Social Services. Dtr had questions about HCPOA because she reports pt's husband is not always present. CSW explained that hospital can assist with HCPOA as long as pt is alert and oriented to sign forms. Dtr will speak with pt about HCPOA and alert staff if she is interested in completing forms.

## 2016-10-20 DIAGNOSIS — D638 Anemia in other chronic diseases classified elsewhere: Secondary | ICD-10-CM | POA: Diagnosis not present

## 2016-10-20 DIAGNOSIS — N179 Acute kidney failure, unspecified: Secondary | ICD-10-CM | POA: Diagnosis not present

## 2016-10-20 DIAGNOSIS — S12000D Unspecified displaced fracture of first cervical vertebra, subsequent encounter for fracture with routine healing: Secondary | ICD-10-CM | POA: Diagnosis not present

## 2016-10-20 DIAGNOSIS — R197 Diarrhea, unspecified: Secondary | ICD-10-CM | POA: Diagnosis not present

## 2016-10-20 DIAGNOSIS — A0472 Enterocolitis due to Clostridium difficile, not specified as recurrent: Secondary | ICD-10-CM | POA: Diagnosis not present

## 2016-10-20 LAB — GLUCOSE, CAPILLARY
GLUCOSE-CAPILLARY: 147 mg/dL — AB (ref 65–99)
GLUCOSE-CAPILLARY: 230 mg/dL — AB (ref 65–99)
GLUCOSE-CAPILLARY: 242 mg/dL — AB (ref 65–99)
Glucose-Capillary: 160 mg/dL — ABNORMAL HIGH (ref 65–99)

## 2016-10-20 NOTE — Progress Notes (Signed)
[facility] PROGRESS NOTE  Merril AbbeBillie A Krus ZOX:[id number] DOB: [birth year] DOA: 10/16/2016  PCP: Irving CopasHACKER,ROBERT KELLER, MD  Brief History/Interval Summary: 70 year old Caucasian female with a past medical history of hypothyroidism, diabetes, history of prior CVA, chronic kidney disease stage III, anemia of chronic disease with recent hospitalization for Enterobacter UTI with associated bacteremia. She was discharged to skilled nursing facility. Apparently she was being discharged from there back home when she had a fall. She was brought into the emergency department and found to have cervical vertebrae fracture. She was also found to have acute renal failure. She also mentioned watery diarrhea. She was hospitalized for further management.  Reason for Visit: Acute renal failure. C. difficile  Consultants: Phone discussion with neurosurgery  Procedures: None  Antibiotics: Oral vancomycin  Subjective/Interval History: Patient not very communicative this morning. Seems to be distracted. Denies any nausea or vomiting. States she has poor appetite. Diarrhea has improved. Continues to have pain in her head and neck due to her recent fall and fracture.    ROS: Denies any chest pain or shortness of breath  Objective:  Vital Signs  Vitals:   10/19/16 1732 10/19/16 2027 10/20/16 0434 10/20/16 0500  BP:  (!) 144/63 (!) 148/62   Pulse: 78 69 71   Resp:  17 17   Temp:  99 F (37.2 C) 99.1 F (37.3 C)   TempSrc:  Oral Oral   SpO2:  97% 97%   Weight:  60.8 kg (134 lb 0.6 oz)  60.8 kg (134 lb 0.6 oz)  Height:        Intake/Output Summary (Last 24 hours) at 10/20/16 0846 Last data filed at 10/20/16 0828  Gross per 24 hour  Intake             1000 ml  Output                0 ml  Net             1000 ml   Filed Weights   10/18/16 2120 10/19/16 2027 10/20/16 0500  Weight: 59.1 kg (130 lb 4.7 oz) 60.8 kg (134 lb 0.6 oz) 60.8 kg (134 lb 0.6 oz)    General appearance: alert,  Somewhat distracted. In no distress. Resp: clear to auscultation bilaterally Cardio: regular rate and rhythm, S1, S2 normal, no murmur, click, rub or gallop GI: soft, non-tender; bowel sounds normal; no masses,  no organomegaly Neurologic: Awake and alert. Mildly distracted. No focal neurological deficits. She does have a cervical neck collar.  Lab Results:  Data Reviewed: I have personally reviewed following labs and imaging studies  CBC:  Recent Labs Lab 10/16/16 1055 10/17/16 0725 10/18/16 0316 10/19/16 0614  WBC 15.2* 9.3 8.7 7.9  NEUTROABS 13.5*  --   --   --   HGB 13.8 9.9* 8.5* 8.7*  HCT 40.6 29.7* 25.6* 26.2*  MCV 89.4 88.1 90.5 90.3  PLT 340 302 258 249    Basic Metabolic Panel:  Recent Labs Lab 10/16/16 1055 10/17/16 0725 10/18/16 0316 10/19/16 0614  NA 137 136 139 136  K 4.6 3.5 3.6 4.0  CL 103 106 109 109  CO2 22 25 24 22   GLUCOSE 376* 147* 109* 126*  BUN 14 17 14 14   CREATININE 1.38* 1.31* 1.13* 1.31*  CALCIUM 8.8* 7.5* 7.3* 7.4*    GFR: Estimated Creatinine Clearance: 34.8 mL/min (by C-G formula based on SCr of 1.31 mg/dL (H)).  Liver Function Tests:  Recent Labs Lab 10/16/16 1055  10/18/16 0316  AST 33 18  ALT 39 19  ALKPHOS 154* 85  BILITOT 0.7 0.1*  PROT 6.0* 3.5*  ALBUMIN 2.4* 1.4*    CBG:  Recent Labs Lab 10/19/16 0802 10/19/16 1205 10/19/16 1648 10/19/16 2024 10/20/16 0801  GLUCAP 144* 169* 108* 97 147*     Anemia Panel: No results for input(s): VITAMINB12, FOLATE, FERRITIN, TIBC, IRON, RETICCTPCT in the last 72 hours.  Recent Results (from the past 240 hour(s))  Culture, Urine     Status: None   Collection Time: 10/16/16 11:06 AM  Result Value Ref Range Status   Specimen Description URINE, RANDOM  Final   Special Requests ADDED [id number]  Final   Culture NO GROWTH  Final   Report Status 10/18/2016 FINAL  Final  C difficile quick screen w PCR reflex     Status: Abnormal   Collection Time: 10/17/16  8:08 AM    Result Value Ref Range Status   C Diff antigen POSITIVE (A) NEGATIVE Final   C Diff toxin NEGATIVE NEGATIVE Final   C Diff interpretation Results are indeterminate. See PCR results.  Final  Clostridium Difficile by PCR     Status: Abnormal   Collection Time: 10/17/16  8:08 AM  Result Value Ref Range Status   Toxigenic C Difficile by pcr POSITIVE (A) NEGATIVE Final    Comment: Positive for toxigenic C. difficile with little to no toxin production. Only treat if clinical presentation suggests symptomatic illness.  Culture, blood (Routine X 2) w Reflex to ID Panel     Status: None (Preliminary result)   Collection Time: 10/17/16  9:00 AM  Result Value Ref Range Status   Specimen Description BLOOD RIGHT HAND  Final   Special Requests IN PEDIATRIC BOTTLE 1CC  Final   Culture NO GROWTH 2 DAYS  Final   Report Status PENDING  Incomplete  Culture, blood (Routine X 2) w Reflex to ID Panel     Status: None (Preliminary result)   Collection Time: 10/17/16  9:12 AM  Result Value Ref Range Status   Specimen Description BLOOD WRIST LEFT  Final   Special Requests IN PEDIATRIC BOTTLE 2CC  Final   Culture NO GROWTH 2 DAYS  Final   Report Status PENDING  Incomplete      Radiology Studies: Mr Abdomen Mrcp Wo Contrast  Result Date: 10/18/2016 CLINICAL DATA:  Status post cholecystectomy with dilated CBD EXAM: MRI ABDOMEN WITHOUT CONTRAST  (INCLUDING MRCP) TECHNIQUE: Multiplanar multisequence MR imaging of the abdomen was performed. Heavily T2-weighted images of the biliary and pancreatic ducts were obtained, and three-dimensional MRCP images were rendered by post processing. COMPARISON:  Right upper ultrasound dated 10/08/2016. CT abdomen pelvis dated 09/18/2016. FINDINGS: Motion degraded images. Lower chest: Lung bases are clear. Hepatobiliary: Liver is poorly evaluated due to motion degradation and artifact. No focal hepatic lesion is seen. Status post cholecystectomy. No intrahepatic duct dilatation.  Dilated CBD, measuring 11 mm (series 5/ image 18), and smoothly tapering at the ampulla. No choledocholithiasis is seen. Pancreas:  Motion degraded but grossly unremarkable. Spleen:  Within normal limits. Adrenals/Urinary Tract:  Adrenal glands are grossly unremarkable. Kidneys are grossly unremarkable.  No hydronephrosis. Stomach/Bowel: Stomach is within normal limits. Visualized bowel is unremarkable. Vascular/Lymphatic:  No evidence of abdominal aortic aneurysm. No suspicious abdominal lymphadenopathy. Other:  Moderate abdominal ascites. Musculoskeletal: No focal osseous lesion. IMPRESSION: Status post cholecystectomy. Common duct measures 11 mm and smoothly tapers at the ampulla, likely postsurgical. No choledocholithiasis is seen. Electronically Signed  By: Charline Bills M.D.   On: 10/18/2016 10:09     Medications:  Scheduled: . amLODipine  5 mg Oral Daily  . aspirin  81 mg Oral Daily  . carvedilol  3.125 mg Oral BID WC  . clopidogrel  75 mg Oral Daily  . enoxaparin (LOVENOX) injection  40 mg Subcutaneous Q24H  . escitalopram  20 mg Oral Daily  . famotidine  20 mg Oral Daily  . feeding supplement (GLUCERNA SHAKE)  237 mL Oral TID BM  . insulin aspart  0-9 Units Subcutaneous TID WC  . levothyroxine  100 mcg Oral QAC breakfast  . vancomycin  125 mg Oral QID   Continuous: . sodium chloride 10 mL/hr (10/18/16 1400)  . dextrose 5 % and 0.45% NaCl 50 mL/hr at 10/19/16 1759   ZOX:[id number] **OR** acetaminophen, dextrose, hydrALAZINE, LORazepam, meclizine, morphine injection, ondansetron **OR** ondansetron (ZOFRAN) IV  Assessment/Plan:  Principal Problem:   C. difficile diarrhea Active Problems:   Insulin dependent type 2 diabetes mellitus, uncontrolled (HCC)   Hypertension   Uncontrolled Hypothyroidism   Benign paroxysmal positional vertigo   Anxiety   History of CVA (cerebrovascular accident)   HLD (hyperlipidemia)   Anemia of chronic disease   Physical deconditioning    Left ventricular diastolic dysfunction, NYHA class 1   Recent UTI (urinary tract infection)   Protein-calorie malnutrition, severe   Recent Gram-negative bacteremia   Acute kidney injury (HCC)   Cervical vertebral fracture (HCC)   Thoracic vertebral fracture (HCC)   Fall at nursing home   Common bile duct dilatation    Acute kidney injury/Chronic kidney disease stage III Patient presented with ongoing diarrhea and volume loss, poor oral intake as well as hyperglycemia related volume losses with mild acute kidney injury. Patient was gently hydrated. Renal function has improved and now close to baseline. Hold offending medications including diuretics/ARB/no NSAIDs. Monitor urine output. Recheck renal function panel tomorrow.  Acute diarrhea with the equivocal C. difficile results C. difficile antigen was positive and toxin was negative. Positive PCR. Since patient was having diarrhea and was in the hospital recently and was on antibiotics, it was reasonable to consider this an acute infection and so she was started on treatment with oral vancomycin. Patient is responding to treatment. Diarrhea has improved.  Insulin dependent type 2 diabetes mellitus, uncontrolled Presented with hyperglycemia with CBGs greater than 300 and greater than 500 overnight. Possibly precipitated by combination of dehydration and suspected underlying infectious processes. She was started back on her Lantus. Secondly, she developed hypoglycemia. Lantus has been discontinued. Patient was placed on her D5 infusion. CBGs appear to be improving. Cut back on the fluids and monitor closely. Some of her low blood sugar is due to poor oral intake. Continue with sliding scale coverage. Hemoglobin A1c on 09/20/16 was 9.2  Uncontrolled Hypothyroidism According to daughter, patient has been on generic Synthroid for unknown period of time and at last admission presented with a TSH of 89 with a low T4. TSH repeated while in the ER and  has increased further to 135 with a free T4 of 1.18. Free T4 had shown improvement compared to previous levels. Continue with Synthroid 100 g daily. Will need repeat thyroid function tests only in 4-6 weeks. May benefit from outpatient endocrinology follow-up.  Recent UTI (urinary tract infection)/Recent Gram-negative bacteremia (Enterobacter) Repeat urinalysis unremarkable and has completed Vantin. Leukocytosis, resolved. Urine cultures and blood cultures negative so far.  Essential Hypertension Continue to monitor blood pressures closely. Holding Cozaar due  to renal failure.  Lesion dorsum right hand Localized lesion with redness and fluctuance at site of apparent previous IV. No streaking or other signs of cellulitis and no palpable cording concerning for superficial thrombophlebitis. Warm compresses.  Physical deconditioning PT/OT evaluation. Will need to return to a skilled nursing facility.  Left ventricular diastolic dysfunction, NYHA class 1 Currently compensated without evidence of heart failure  Cervical vertebral fracture/Thoracic vertebral fracture/Fall at nursing home EDP spoke with neurosurgery who stated these fractures do not require surgical intervention and neurosurgeon recommended hard cervical collar with later follow-up as an outpatient with neurosurgery. Outpatient follow-up with Dr. Jordan Likes who has previously operated on this patient.  Dilated CBD During previous admission had abdominal ultrasound that revealed a 10 mm dilated common bile duct and at that time had abnormal transaminases. As evaluated by [facility] with recommendations to follow-up in one week and obtain an outpatient MRCP (see discharge summary). MRCP was done this hospitalization and shows mildly dilated CBD without any choledocholithiasis or other abnormalities. Thought to be related to previous cholecystectomy. Does not need any further workup at this time.  Benign paroxysmal positional  vertigo Continue prn Antivert  Anxiety Continue preadmission Lexapro and Xanax  History of CVA (cerebrovascular accident) Continue aspirin and Plavix.   HLD (hyperlipidemia) Okay to resume statin at discharge.  Anemia of chronic disease Presented with a hemoglobin of 13 with a baseline of around 9.7-10 concerning for hemoconcentration from dehydration. After given fluids in the ER hemoglobin down to 9.9. Was on oral iron prior to admission , which can be resumed at discharge. Ferritin level 211.  Protein-calorie malnutrition, severe Nutrition consultation.   DVT Prophylaxis: Lovenox    Code Status: Full code  Family Communication: Discussed with patient's daughter over the phone. Disposition Plan: Management as outlined above. Await stabilization of blood glucose levels. Will need to go to skilled nursing facility when ready for discharge. Anticipate in the next 1-2 days   LOS: 3 days   [facility]  [facility] Pager [phone] 10/20/2016, 8:46 AM  If 7PM-7AM, please contact night-coverage at [email or web address], password [facility]

## 2016-10-20 NOTE — Progress Notes (Signed)
Found patient's cervical collar lying at her side ,out from her neck.Nurse asked the patient who removed it,patient responded that she did becouse  Its hurting her.Educated patient on the purpose of her cervical collar and tried to encouraged her to put it back,patient refused.Patient's daughter called,made aware of that.Daughter said '' she tried to removed it when we were there earlier ''.Daughter requested also not to give insulin when the patient would not be eating her meals.Nurse explained the protocol and would convey her request to M.D..

## 2016-10-21 DIAGNOSIS — D638 Anemia in other chronic diseases classified elsewhere: Secondary | ICD-10-CM | POA: Diagnosis not present

## 2016-10-21 DIAGNOSIS — Z7189 Other specified counseling: Secondary | ICD-10-CM

## 2016-10-21 DIAGNOSIS — R197 Diarrhea, unspecified: Secondary | ICD-10-CM | POA: Diagnosis not present

## 2016-10-21 DIAGNOSIS — S12000D Unspecified displaced fracture of first cervical vertebra, subsequent encounter for fracture with routine healing: Secondary | ICD-10-CM | POA: Diagnosis not present

## 2016-10-21 DIAGNOSIS — N179 Acute kidney failure, unspecified: Secondary | ICD-10-CM | POA: Diagnosis not present

## 2016-10-21 DIAGNOSIS — E860 Dehydration: Secondary | ICD-10-CM

## 2016-10-21 DIAGNOSIS — E034 Atrophy of thyroid (acquired): Secondary | ICD-10-CM

## 2016-10-21 DIAGNOSIS — W19XXXA Unspecified fall, initial encounter: Secondary | ICD-10-CM | POA: Diagnosis not present

## 2016-10-21 DIAGNOSIS — A0472 Enterocolitis due to Clostridium difficile, not specified as recurrent: Secondary | ICD-10-CM | POA: Diagnosis not present

## 2016-10-21 DIAGNOSIS — Z515 Encounter for palliative care: Secondary | ICD-10-CM

## 2016-10-21 LAB — BASIC METABOLIC PANEL
ANION GAP: 10 (ref 5–15)
BUN: 12 mg/dL (ref 6–20)
CALCIUM: 7.7 mg/dL — AB (ref 8.9–10.3)
CO2: 22 mmol/L (ref 22–32)
Chloride: 103 mmol/L (ref 101–111)
Creatinine, Ser: 1.41 mg/dL — ABNORMAL HIGH (ref 0.44–1.00)
GFR, EST AFRICAN AMERICAN: 43 mL/min — AB (ref >60)
GFR, EST NON AFRICAN AMERICAN: 37 mL/min — AB (ref >60)
Glucose, Bld: 385 mg/dL — ABNORMAL HIGH (ref 65–99)
POTASSIUM: 4.6 mmol/L (ref 3.5–5.1)
Sodium: 135 mmol/L (ref 135–145)

## 2016-10-21 LAB — PROCALCITONIN: PROCALCITONIN: 0.38 ng/mL

## 2016-10-21 LAB — CBC
HEMATOCRIT: 27.8 % — AB (ref 36.0–46.0)
Hemoglobin: 9.1 g/dL — ABNORMAL LOW (ref 12.0–15.0)
MCH: 29.4 pg (ref 26.0–34.0)
MCHC: 32.7 g/dL (ref 30.0–36.0)
MCV: 89.7 fL (ref 78.0–100.0)
Platelets: 298 10*3/uL (ref 150–400)
RBC: 3.1 MIL/uL — AB (ref 3.87–5.11)
RDW: 13.6 % (ref 11.5–15.5)
WBC: 9 10*3/uL (ref 4.0–10.5)

## 2016-10-21 LAB — GLUCOSE, CAPILLARY
GLUCOSE-CAPILLARY: 441 mg/dL — AB (ref 65–99)
GLUCOSE-CAPILLARY: 119 mg/dL — AB (ref 65–99)
Glucose-Capillary: 293 mg/dL — ABNORMAL HIGH (ref 65–99)
Glucose-Capillary: 242 mg/dL — ABNORMAL HIGH (ref 65–99)
Glucose-Capillary: 204 mg/dL — ABNORMAL HIGH (ref 65–99)

## 2016-10-21 MED ORDER — SYNTHROID 100 MCG PO TABS: 100.0000 ug | ORAL_TABLET | Freq: Every day | ORAL | Status: DC

## 2016-10-21 MED ORDER — ESCITALOPRAM OXALATE 20 MG PO TABS
20.0000 mg | ORAL_TABLET | ORAL | Status: DC
  Administered 2016-10-21 – 2016-10-22 (×2): 20 mg via ORAL
  Filled 2016-10-21 (×2): qty 1

## 2016-10-21 NOTE — Progress Notes (Signed)
Palliative nurse practitioner reported that pt is very lethargic and tired and if staff could check VS and CBG.  Pt alert, able to follow commands and also was responding appropriately, but said she is tired and sleepy nothing unusual.  Checked her VS BP 127/60, P 70, PO2 95 on RA, Temp 98.2, resp 16.  CBG 242.  Dr Rito EhrlichKrishnan made aware.  Will continue to monitor.

## 2016-10-21 NOTE — Progress Notes (Signed)
Inpatient Diabetes Program Recommendations  AACE/ADA: New Consensus Statement on Inpatient Glycemic Control (2015)  Target Ranges:  Prepandial:   less than 140 mg/dL      Peak postprandial:   less than 180 mg/dL (1-2 hours)      Critically ill patients:  140 - 180 mg/dL   Lab Results  Component Value Date   GLUCAP 293 (H) 10/21/2016   HGBA1C 9.2 (H) 09/20/2016    Review of Glycemic Control  Results for Cassandra [name], Jolane A (MRN [id number]) as of 10/21/2016 12:16  Ref. Range 10/20/2016 11:49 10/20/2016 16:50 10/20/2016 21:26 10/21/2016 07:43 10/21/2016 11:51  Glucose-Capillary Latest Ref Range: 65 - 99 mg/dL 469230 (H) 629160 (H) 528242 (H) 441 (H) 293 (H)    Diabetes history: Type 1  Outpatient Diabetes medications: Humalog 5 units tid, Lantus 8 units qhs, Novolog 0-9 units tid  Current orders for Inpatient glycemic control: Novolog 0-9 units tid  Inpatient Diabetes Program Recommendations: Consider re-starting Lantus 4 units qhs and Novolog 0-5 units qhs  Susette RacerJulie Alanys Godino, RN, [place], [place], CDE Diabetes Coordinator Inpatient Diabetes Program  [phone] (Team Pager) [phone] [facility] Office) 10/21/2016 12:32 PM

## 2016-10-21 NOTE — Consult Note (Signed)
Consultation Note Date: 10/21/2016   Patient Name: Cassandra [name]  DOB: September 17, 1946  MRN: [id number]  Age / Sex: 70 y.o., female  PCP: Henrine Screws, MD Referring Physician: Osvaldo Shipper, MD  Reason for Consultation: Establishing goals of care  HPI/Patient Profile: 70 y.o. female  with past medical history of hypothyroidism, uncontrolled diabetes, CVA, CKD stage III, anemia of chronic disease, HTN, HLD, and left ventricular diastolic dysfunction admitted on 10/16/2016 after falling while being discharged from acute rehab. In the ED she was found to have multiple cervical and thoracic vertebra fractures, which neurosurgery felt did not require surgical intervention and advised a hard cervical collar and outpatient follow-up with Neurosurgery. Incidentally, ED work-up also revealed acute kidney injury, felt to be secondary to dehydration, and acute C. Difficile infection.  Clinical Assessment and Goals of Care: Cassandra [name] endorses discomfort at the base of her head and generalized fatigue. Unfortunately, our ability to assess her further was limited by her marked lethargy and inability to remain awake during our conversation. Unclear on the etiology of this lethargy, as she has had no PRN pain or anxiety medication since yesterday and endorsed sleeping well overnight. Possibly a component of underlying dementia, as pt's daughter and primary team note variable mentation and alertness. Care RN asked to check pt's blood sugar and vitals and call primary team if abnormal.   With the patient's support, her daughter Gar Gibbon [facility]) was called. [name] endorsed DNR for the patient (paperwork already in place in chart), and wanted information on outpatient plan for patient support. We discussed SNF placement and option for outpatient Palliative Care, which she was strongly in support of. We also discussed Hospice, and  [name] was open to their services if needed. Presently, the plan is to have pt utilize Palliative services at the SNF, and then transition to Hospice if she deteriorates or has increasing symptom burden.   Of note, [name]'s main concerns were adequate blood sugar monitoring and treatment at SNF, and adequate safety monitoring for her mother. She would like to be involved in all care decisions and desires good communication with the outpatient Palliative Care team.   Primary Decision Maker HCPOA   SUMMARY OF RECOMMENDATIONS   -Discharge to SNF with Palliative Care; pt's HCPOA open to Hospice support if pt deteriorates or has increased symptom burden -DNR placed in EPIC -Consider scheduled Tylenol for pain control   Code Status/Advance Care Planning:  DNR  Symptom Management:   Pain at the base of her neck; continue tylenol. Would try to avoid morphine as able given risk for toxic metabolite build-up in setting of renal issues. Could consider scheduled Tylenol.  Palliative Prophylaxis:   Aspiration, Delirium Protocol and Frequent Pain Assessment  Additional Recommendations (Limitations, Scope, Preferences):  None at present  Psycho-social/Spiritual:   Desire for further Chaplaincy support:no  Additional Recommendations: None presently  Prognosis:   Unable to determine  Discharge Planning: Skilled Nursing Facility for rehab with Palliative care service follow-up      Primary Diagnoses: Present on  Admission: . Acute kidney injury (HCC) . Anemia of chronic disease . Anxiety . Benign paroxysmal positional vertigo . Recent Gram-negative bacteremia . Protein-calorie malnutrition, severe . Physical deconditioning . Uncontrolled Hypothyroidism . HLD (hyperlipidemia) . Hypertension . Recent UTI (urinary tract infection) . Left ventricular diastolic dysfunction, NYHA class 1 . Cervical vertebral fracture (HCC) . Thoracic vertebral fracture (HCC) . Fall at nursing home .  C. difficile diarrhea . Common bile duct dilatation   I have reviewed the medical record, interviewed the patient and family, and examined the patient. The following aspects are pertinent.  Past Medical History:  Diagnosis Date  . Allergic rhinitis   . Anemia   . Anxiety   . Arthritis    "mostly feet, hands" (10/17/2016)  . Benign hypertension with CKD (chronic kidney disease) stage III   . Benign paroxysmal positional vertigo   . Bipolar disorder (HCC)   . Broken finger   . Broken shoulder   . Broken toes   . Cervicalgia   . CHF (congestive heart failure) (HCC) 07/2016  . CKD (chronic kidney disease) stage 3, GFR 30-59 ml/min   . COPD (chronic obstructive pulmonary disease) (HCC)   . Depression   . Elevated liver enzymes Hep B/C neg 2014  . Fall at nursing home 10/15/2016  . GERD (gastroesophageal reflux disease)   . Glaucoma   . High cholesterol   . History of alcohol use   . History of blood transfusion 09/2016   "blood got really really low"  . Hypertension   . Hypothyroidism   . Interstitial cystitis    bladder stretched every 9 months  . Migraines    "pretty much qd" (10/17/2016)  . Psoriasis   . Stroke (HCC) 05/16/2016   Left occipital and thalamic, right hippocampal  . Thyroid disease   . Tobacco use   . 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 Vitamin B12 deficiency    Social History   Social History  . Marital status: Married    Spouse name: N/A  . Number of children: N/A  . Years of education: N/A   Social History Main Topics  . Smoking status: Former Smoker    Packs/day: 0.25    Years: 10.00    Types: Cigarettes    Quit date: 12/1923  . Smokeless tobacco: Never Used  . Alcohol use No     Comment: Hasn't had any alcohol "for 2 yrs" (10/17/2016)  . Drug use: No  . Sexual activity: Yes   Other Topics Concern  . None   Social History Narrative   Patient has been married for 13 years and lives with spouse, has 2 children, 3  grandchildren, and 1 great grandchild. She does not use walker/cane, does not clean house, does not shop, does not do yard work, and does not drive. She is retired. 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 [employer]pleted [employer].         Epwoth Sleepiness Scale Score:  7      --I have high blood pressure   --I have had insomnia   --I seem to be losing my sex drive   --I feel stressed and lack motivation   --I wake up to urinate frequently at night   --I wake up with a dry mouth or sore throat   --I feel excessively sleepy and tired throughout the day   --I have Diabetes   --I have been told that I snore   --I have problems  with memory/concentration   --I frequently awake with headaches       Family History  Problem Relation Age of Onset  . Alcohol abuse Mother   . Arthritis Mother   . Asthma Mother   . Cancer Mother     colon cancer  . Hypertension Mother   . Migraines Mother   . Stroke Mother   . Lung disease Mother   . COPD Mother   . Diabetes Father   . Hypertension Father   . Heart disease Father   . Heart attack Father   . Heart disease Paternal Grandmother   . Diabetes Paternal Grandmother   . Stroke Paternal Grandmother   . Cancer Paternal Grandmother   . Diabetes Paternal Grandfather    Scheduled Meds: . amLODipine  5 mg Oral Daily  . aspirin  81 mg Oral Daily  . carvedilol  3.125 mg Oral BID WC  . clopidogrel  75 mg Oral Daily  . enoxaparin (LOVENOX) injection  40 mg Subcutaneous Q24H  . escitalopram  20 mg Oral Daily  . famotidine  20 mg Oral Daily  . feeding supplement (GLUCERNA SHAKE)  237 mL Oral TID BM  . insulin aspart  0-9 Units Subcutaneous TID WC  . levothyroxine  100 mcg Oral QAC breakfast  . vancomycin  125 mg Oral QID   Continuous Infusions: PRN Meds:.acetaminophen **OR** acetaminophen, dextrose, hydrALAZINE, LORazepam, meclizine, morphine injection, ondansetron **OR** ondansetron (ZOFRAN) IV Allergies  Allergen Reactions  .  Alprazolam Other (See Comments)    Family preference, for patient to not take med  . Percocet [Oxycodone-Acetaminophen] Other (See Comments)    Family preference, for patient to not take med  . Codeine Diarrhea and Nausea And Vomiting  . Doxycycline Diarrhea and Nausea And Vomiting  . Hydrocodone Nausea And Vomiting  . Omnicef [Cefdinir] Nausea Only and Other (See Comments)    Constipation, tolerated Zosyn  . Augmentin [Amoxicillin-Pot Clavulanate] Hives and Rash    Has patient had a PCN reaction causing immediate rash, facial/tongue/throat swelling, SOB or lightheadedness with hypotension: Yes Has patient had a PCN reaction causing severe rash involving mucus membranes or skin necrosis: Yes Did a PCN reaction that required hospitalization No Did PCN reaction occurring within the last 10 years: Yes If all of the above answers are "NO", then may proceed with Cephalosporin use.  Pt states she has taken penicillin since, and was ok with it...  . Ciprofloxacin Hives   Review of Systems  Discomfort at base of head Fatigue Eating well and sleeping well  Physical Exam  Constitutional: She appears lethargic. She has a sickly appearance.  Frail and thin  Eyes: EOM are normal.  Pulmonary/Chest: Effort normal.  Neurological: She appears lethargic.  Difficult to assess, pt very lethargic and unable to maintain prolonged conversation. Does answer questions appropriately when she is awake  Skin: Skin is warm and dry. There is pallor.   Vital Signs: BP (!) 142/69 (BP Location: Right Arm)   Pulse 81   Temp 98.1 F (36.7 C) (Oral)   Resp 18   Ht 5\' 2"  (1.575 m)   Wt 60.9 kg (134 lb 4.2 oz)   SpO2 96%   BMI 24.56 kg/m  Pain Assessment: No/denies pain POSS *See Group Information*: 1-Acceptable,Awake and alert Pain Score: 0-No pain   SpO2: SpO2: 96 % O2 Device:SpO2: 96 % O2 Flow Rate: .   IO: Intake/output summary:  Intake/Output Summary (Last 24 hours) at 10/21/16 1351 Last data  filed at 10/21/16  1300  Gross per 24 hour  Intake              340 ml  Output                0 ml  Net              340 ml    LBM: Last BM Date: 10/19/16 Baseline Weight: Weight: 56.7 kg (125 lb) Most recent weight: Weight: 60.9 kg (134 lb 4.2 oz)     Palliative Assessment/Data:  PPS 40%      Time In: 1350 Time Out: 1440 Time Total: 50 Greater than 50%  of this time was spent counseling and coordinating care related to the above assessment and plan.  Signed by: Murrell Converse, NP Palliative Medicine Team Team Phone # [phone] (Nights/Weekends)

## 2016-10-21 NOTE — Care Management Note (Signed)
Case Management Note  Patient Details  Name: Cassandra [name] MRN: [id number] Date of Birth: 1946-04-10  Subjective/Objective:    CM following for progression and d/c planning.                 Action/Plan: 10/21/2016 Pt no longer meeting acute inpt criteria as no IVs and all meds are po. Record reviewed and per pt insurance provider she no longer meets inpt criteria. This CM notified by CM reviewer, Jacquelynn CreeMary Krieg RN, who has discussed with medical director Dr Quillian Quince Aronson. This CM notified this pt attending Dr Osvaldo ShipperGokul Krishnan. He would like for the pt to remain in the hospital for one more day to allow for medication adjustments. This CM notified pt daughters as pt is unable to discuss and no answer from pt husband. Per conference call with pt daughters, Raoul PitchKeeley Simmons @ [phone] and [name] @ [phone]  [phone] they both agree they would be responsible for the cost involved in another 24 hr stay if the pt insurance does not cover this . [facility] was explained in detail and both daughters verbalized understanding.  Pt has been changed to OBS status, and code 44 issued.  Expected Discharge Date:      10/22/2016            Expected Discharge Plan:  Skilled Nursing Facility  In-House Referral:  Clinical Social Work  Discharge planning Services  CM Consult  Post Acute Care Choice:  NA Choice offered to:  NA  DME Arranged:  N/A DME Agency:  NA  HH Arranged:  NA HH Agency:  NA  Status of Service:  Completed, signed off  If discussed at [employer] Length of Stay Meetings, dates discussed:    Additional Comments:  Starlyn SkeansRoyal, Ashni Lonzo U, RN 10/21/2016, 4:06 PM

## 2016-10-21 NOTE — Care Management Obs Status (Signed)
MEDICARE OBSERVATION STATUS NOTIFICATION   Patient Details  Name: Cassandra [name] MRN: [id number] Date of Birth: January 27, 1946   Medicare Observation Status Notification Given:  Yes    Jarvis Sawa, Annamarie MajorCheryl U, RN 10/21/2016, 4:02 PM

## 2016-10-21 NOTE — Progress Notes (Signed)
Physical Therapy Treatment Patient Details Name: Cassandra [name] MRN: [id number] DOB: 11-17-1946 Today's Date: 10/21/2016    History of Present Illness 70 yo female presents with C6, C7, T1, and T2 fxs after a fall.  pt with hx of DM, CVA, BPPV, HTN, CKD, Anxiety, Depression, bipolar, and hx Etoh.      PT Comments    Pt is up to bedside with PT and then chair with RW, using safe technique as cued by PT.  Her plan of care is to continue strengthenign and OOB to chair acutely to progress to SNF care, and will work to advance her and reduce need for SNF stay.  Pt up in chair with collar in place, has signficant trouble with endurance and standing balance with buckled appearance at knees during the walk to the chair.    Follow Up Recommendations  SNF     Equipment Recommendations  None recommended by PT    Recommendations for Other Services       Precautions / Restrictions Precautions Precautions: Fall (telemetry) Precaution Comments: multiple falls  Required Braces or Orthoses: Cervical Brace Cervical Brace: Hard collar;At all times Restrictions Weight Bearing Restrictions: No    Mobility  Bed Mobility Overal bed mobility: Needs Assistance Bed Mobility: Supine to Sit;Rolling Rolling: Min assist Sidelying to sit: Mod assist Supine to sit: Mod assist     General bed mobility comments: reminded pt to use bed rail and to logroll for transition OOB  Transfers Overall transfer level: Needs assistance Equipment used: Rolling walker (2 wheeled) Transfers: Sit to/from [employer] CorporationStand;Stand Pivot Transfers Sit to Stand: Min assist Stand pivot transfers: Min assist       General transfer comment: used RW to stand at bedside and to sidestep to chair  Ambulation/Gait Ambulation/Gait assistance: Min assist;Min guard Ambulation Distance (Feet): 3 Feet Assistive device: Rolling walker (2 wheeled) Gait Pattern/deviations: Step-to pattern;Narrow base of support;Trunk flexed;Decreased  stride length Gait velocity: reduced Gait velocity interpretation: Below normal speed for age/gender General Gait Details: sidesteps with RW to chair and stepped backward, does not remember even with cues to reach back to chair before sitting   Stairs            Wheelchair Mobility    Modified Rankin (Stroke Patients Only)       Balance Overall balance assessment: Needs assistance;History of Falls Sitting-balance support: Bilateral upper extremity supported Sitting balance-Leahy Scale: Fair   Postural control: Posterior lean Standing balance support: Bilateral upper extremity supported Standing balance-Leahy Scale: Poor                      Cognition Arousal/Alertness: Lethargic Behavior During Therapy: Flat affect Overall Cognitive Status: Impaired/Different from baseline Area of Impairment: Attention;Following commands;Safety/judgement;Awareness;Problem solving Orientation Level: Situation Current Attention Level: Selective Memory: Decreased recall of precautions;Decreased short-term memory Following Commands: Follows one step commands with increased time Safety/Judgement: Decreased awareness of safety;Decreased awareness of deficits Awareness: Intellectual Problem Solving: Slow processing;Requires verbal cues      Exercises      General Comments General comments (skin integrity, edema, etc.): pulses were 72-77 with no complaining of feeling any symptoms of breathing or pulse with mobility      Pertinent Vitals/Pain Pain Assessment: Faces Faces Pain Scale: Hurts a little bit Pain Location: shoulders Pain Descriptors / Indicators: Aching Pain Intervention(s): Limited activity within patient's tolerance;Monitored during session;Premedicated before session;Repositioned    Home Living  Prior Function            PT Goals (current goals can now be found in the care plan section) Acute Rehab PT Goals Patient Stated  Goal: to get to another building but not most recent Progress towards PT goals: Progressing toward goals    Frequency    Min 2X/week      PT Plan Current plan remains appropriate    Co-evaluation             End of Session Equipment Utilized During Treatment: Gait belt;Cervical collar Activity Tolerance: Patient tolerated treatment well;Patient limited by fatigue Patient left: in chair;with call bell/phone within reach;with chair alarm set (chair alarm is very sensitive to any movement)     Time: [id number] PT Time Calculation (min) (ACUTE ONLY): 27 min  Charges:  $Gait Training: 8-22 mins $Therapeutic Exercise: 8-22 mins                    G Codes:      Ivar DrapeStout, Christien Frankl E 10/21/2016, 5:46 PM    Samul Dadauth Cassiel Fernandez, PT MS Acute Rehab Dept. Number: [facility] [id number] and [facility] [phone]

## 2016-10-21 NOTE — Progress Notes (Signed)
   10/21/16 0900  Clinical Encounter Type  Visited With Patient and family together  Visit Type Initial  Referral From Nurse  Consult/Referral To Chaplain  Spiritual Encounters  Spiritual Needs Emotional  Stress Factors  Patient Stress Factors None identified  Family Stress Factors Exhausted  Advance Directives (For Healthcare)  Does patient have an advance directive? Yes  Would patient like information on creating an advanced directive? Yes - Transport plannerducational materials given  Type of Estate agentAdvance Directive Healthcare Power of Van HorneAttorney;Living will  Does patient want to make changes to advanced directive? Yes - information given  Copy of advanced directive(s) in chart? Yes    Chaplain responded to page from nurse on 6e. Completed advanced directive with patient and family. Answered questions, requested notary and volunteers. Paperwork is complete. Gave to nurse to make copies.

## 2016-10-21 NOTE — Care Management CC44 (Signed)
Condition Code 44 Documentation Completed  Patient Details  Name: Cassandra AbbeBillie A Demeyer MRN: [id number] Date of Birth: 1946-01-11   Condition Code 44 given:   yes presented by phone via conference call to both daughters, Raoul PitchKeeley Simmons and Gar GibbonNicole King.  Patient signature on Condition Code 44 notice:   Verbal consent obtained stating understanding and wish for pt to remain in the hospital until 10/22/2016. Documentation of 2 MD's agreement:   yes Code 44 added to claim:   yes    Starlyn SkeansRoyal, Tiani Stanbery U, RN 10/21/2016, 4:17 PM

## 2016-10-21 NOTE — Progress Notes (Addendum)
[facility] PROGRESS NOTE  Cassandra [name]:[id number] DOB: 05-13-1946 DOA: 10/16/2016  PCP: Irving Copas, MD  Brief History/Interval Summary: 70 year old Caucasian female with a past medical history of hypothyroidism, diabetes, history of prior CVA, chronic kidney disease stage III, anemia of chronic disease with recent hospitalization for Enterobacter UTI with associated bacteremia. She was discharged to skilled nursing facility. Apparently she was being discharged from there back home when she had a fall. She was brought into the emergency department and found to have cervical vertebrae fracture. She was also found to have acute renal failure. She also mentioned watery diarrhea. She was hospitalized for further management.  Reason for Visit: Acute renal failure. C. difficile  Consultants: Phone discussion with neurosurgery  Procedures: None  Antibiotics: Oral vancomycin  Subjective/Interval History: Patient states that overall she feels better this morning. Continues to have poor appetite. Continues to have pain in her head and neck due to her recent fall and fracture. Denies any nausea or vomiting. Diarrhea has subsided.  ROS: Denies any chest pain or shortness of breath  Objective:  Vital Signs  Vitals:   10/20/16 0944 10/20/16 1648 10/20/16 2133 10/21/16 0528  BP: (!) 170/70 (!) 154/63 (!) 161/70 (!) 150/64  Pulse: 77 70 78 75  Resp: 18 17 17 16   Temp: 99.1 F (37.3 C) 97.8 F (36.6 C) 98.6 F (37 C) 98.8 F (37.1 C)  TempSrc: Oral Oral Oral Oral  SpO2: 96% 96% 97% 96%  Weight:   60.9 kg (134 lb 4.2 oz)   Height:        Intake/Output Summary (Last 24 hours) at 10/21/16 0805 Last data filed at 10/21/16 0600  Gross per 24 hour  Intake              240 ml  Output                0 ml  Net              240 ml   Filed Weights   10/19/16 2027 10/20/16 0500 10/20/16 2133  Weight: 60.8 kg (134 lb 0.6 oz) 60.8 kg (134 lb 0.6 oz) 60.9 kg (134 lb  4.2 oz)    General appearance: alert, Somewhat distracted. In no distress. Resp: clear to auscultation bilaterally Cardio: regular rate and rhythm, S1, S2 normal, no murmur, click, rub or gallop GI: soft, non-tender; bowel sounds normal; no masses,  no organomegaly Neurologic: Awake and alert. Mildly distracted. No focal neurological deficits. She does have a cervical neck collar.  Lab Results:  Data Reviewed: I have personally reviewed following labs and imaging studies  CBC:  Recent Labs Lab 10/16/16 1055 10/17/16 0725 10/18/16 0316 10/19/16 0614 10/21/16 0552  WBC 15.2* 9.3 8.7 7.9 9.0  NEUTROABS 13.5*  --   --   --   --   HGB 13.8 9.9* 8.5* 8.7* 9.1*  HCT 40.6 29.7* 25.6* 26.2* 27.8*  MCV 89.4 88.1 90.5 90.3 89.7  PLT 340 302 258 249 298    Basic Metabolic Panel:  Recent Labs Lab 10/16/16 1055 10/17/16 0725 10/18/16 0316 10/19/16 0614 10/21/16 0552  NA 137 136 139 136 135  K 4.6 3.5 3.6 4.0 4.6  CL 103 106 109 109 103  CO2 22 25 24 22 22   GLUCOSE 376* 147* 109* 126* 385*  BUN 14 17 14 14 12   CREATININE 1.38* 1.31* 1.13* 1.31* 1.41*  CALCIUM 8.8* 7.5* 7.3* 7.4* 7.7*    GFR: Estimated Creatinine Clearance:  32.3 mL/min (by C-G formula based on SCr of 1.41 mg/dL (H)).  Liver Function Tests:  Recent Labs Lab 10/16/16 1055 10/18/16 0316  AST 33 18  ALT 39 19  ALKPHOS 154* 85  BILITOT 0.7 0.1*  PROT 6.0* 3.5*  ALBUMIN 2.4* 1.4*    CBG:  Recent Labs Lab 10/19/16 2024 10/20/16 0801 10/20/16 1149 10/20/16 1650 10/20/16 2126  GLUCAP 97 147* 230* 160* 242*     Anemia Panel: No results for input(s): VITAMINB12, FOLATE, FERRITIN, TIBC, IRON, RETICCTPCT in the last 72 hours.  Recent Results (from the past 240 hour(s))  Culture, Urine     Status: None   Collection Time: 10/16/16 11:06 AM  Result Value Ref Range Status   Specimen Description URINE, RANDOM  Final   Special Requests ADDED [id number]  Final   Culture NO GROWTH  Final   Report  Status 10/18/2016 FINAL  Final  C difficile quick screen w PCR reflex     Status: Abnormal   Collection Time: 10/17/16  8:08 AM  Result Value Ref Range Status   C Diff antigen POSITIVE (A) NEGATIVE Final   C Diff toxin NEGATIVE NEGATIVE Final   C Diff interpretation Results are indeterminate. See PCR results.  Final  Clostridium Difficile by PCR     Status: Abnormal   Collection Time: 10/17/16  8:08 AM  Result Value Ref Range Status   Toxigenic C Difficile by pcr POSITIVE (A) NEGATIVE Final    Comment: Positive for toxigenic C. difficile with little to no toxin production. Only treat if clinical presentation suggests symptomatic illness.  Culture, blood (Routine X 2) w Reflex to ID Panel     Status: None (Preliminary result)   Collection Time: 10/17/16  9:00 AM  Result Value Ref Range Status   Specimen Description BLOOD RIGHT HAND  Final   Special Requests IN PEDIATRIC BOTTLE 1CC  Final   Culture NO GROWTH 3 DAYS  Final   Report Status PENDING  Incomplete  Culture, blood (Routine X 2) w Reflex to ID Panel     Status: None (Preliminary result)   Collection Time: 10/17/16  9:12 AM  Result Value Ref Range Status   Specimen Description BLOOD WRIST LEFT  Final   Special Requests IN PEDIATRIC BOTTLE 2CC  Final   Culture NO GROWTH 3 DAYS  Final   Report Status PENDING  Incomplete      Radiology Studies: No results found.   Medications:  Scheduled: . amLODipine  5 mg Oral Daily  . aspirin  81 mg Oral Daily  . carvedilol  3.125 mg Oral BID WC  . clopidogrel  75 mg Oral Daily  . enoxaparin (LOVENOX) injection  40 mg Subcutaneous Q24H  . escitalopram  20 mg Oral Daily  . famotidine  20 mg Oral Daily  . feeding supplement (GLUCERNA SHAKE)  237 mL Oral TID BM  . insulin aspart  0-9 Units Subcutaneous TID WC  . levothyroxine  100 mcg Oral QAC breakfast  . vancomycin  125 mg Oral QID   Continuous:  BJY:[id number]:acetaminophen **OR** acetaminophen, dextrose, hydrALAZINE, LORazepam, meclizine,  morphine injection, ondansetron **OR** ondansetron (ZOFRAN) IV  Assessment/Plan:  Principal Problem:   C. difficile diarrhea Active Problems:   Insulin dependent type 2 diabetes mellitus, uncontrolled (HCC)   Hypertension   Uncontrolled Hypothyroidism   Benign paroxysmal positional vertigo   Anxiety   History of CVA (cerebrovascular accident)   HLD (hyperlipidemia)   Anemia of chronic disease   Physical deconditioning  Left ventricular diastolic dysfunction, NYHA class 1   Recent UTI (urinary tract infection)   Protein-calorie malnutrition, severe   Recent Gram-negative bacteremia   Acute kidney injury (HCC)   Cervical vertebral fracture (HCC)   Thoracic vertebral fracture (HCC)   Fall at nursing home   Common bile duct dilatation    Acute kidney injury/Chronic kidney disease stage III Patient presented with ongoing diarrhea and volume loss, poor oral intake as well as hyperglycemia related volume losses with mild acute kidney injury. Patient was gently hydrated. Renal function is close to baseline. Hold offending medications including diuretics/ARB/no NSAIDs. Monitor urine output.  Acute diarrhea with the equivocal C. difficile results C. difficile antigen was positive and toxin was negative. Positive PCR. Since patient was having diarrhea and was in the hospital recently and was on antibiotics, it was reasonable to consider this an acute infection and so she was started on treatment with oral vancomycin. Patient is responding to treatment. Diarrhea appears to have resolved.  Insulin dependent type 2 diabetes mellitus, uncontrolled Presented with hyperglycemia with CBGs greater than 300 and greater than 500 overnight. Possibly precipitated by combination of dehydration and suspected underlying infectious processes. She was started back on her Lantus. Subsequently, she developed hypoglycemia. Patient was placed on her D5 infusion. CBGs appear to be improving. Once again  hyperglycemic this morning. She appears to have very brittle diabetes. Plus the fact that her oral intake is inconsistent makes treating this very challenging. She'll be given her usual dose of NovoLog as per sliding scale. CBGs will be repeated later today and we will decide on Lantus at that time. D5 water has been discontinued. We'll need to watch closely for episodes of hypoglycemia. Hemoglobin A1c on 09/20/16 was 9.2  Uncontrolled Hypothyroidism According to daughter, patient has been on generic Synthroid for unknown period of time and at last admission presented with a TSH of 89 with a low T4. TSH repeated while in the ER and has increased further to 135 with a free T4 of 1.18. Free T4 had shown improvement compared to previous levels. Continue with Synthroid 100 g daily. Will need repeat thyroid function tests only in 4-6 weeks. May benefit from outpatient endocrinology follow-up. We'll recommend treating with brand name Synthroid.  Recent UTI (urinary tract infection)/Recent Gram-negative bacteremia (Enterobacter) Repeat urinalysis unremarkable and has completed Vantin. Leukocytosis, resolved. Urine cultures and blood cultures negative.  Essential Hypertension Continue to monitor blood pressures closely. Holding Cozaar due to renal failure.  Lesion dorsum right hand Localized lesion with redness and fluctuance at site of apparent previous IV. No streaking or other signs of cellulitis and no palpable cording concerning for superficial thrombophlebitis. Warm compresses.  Physical deconditioning PT/OT evaluation. Will need to return to a skilled nursing facility. Discussed in detail with patient's daughter yesterday. She is interested in having discussion with palliative medicine. Consulted.  Left ventricular diastolic dysfunction, NYHA class 1 Currently compensated without evidence of heart failure  Cervical vertebral fracture/Thoracic vertebral fracture/Fall at nursing home EDP  spoke with neurosurgery who stated these fractures do not require surgical intervention and neurosurgeon recommended hard cervical collar with later follow-up as an outpatient with neurosurgery. Outpatient follow-up with Dr. Jordan LikesPool who has previously operated on this patient.  Dilated CBD During previous admission had abdominal ultrasound that revealed a 10 mm dilated common bile duct and at that time had abnormal transaminases. As [facility] with recommendations to follow-up in one week and obtain an outpatient MRCP (see discharge summary). MRCP  was done this hospitalization and shows mildly dilated CBD without any choledocholithiasis or other abnormalities. Thought to be related to previous cholecystectomy. Does not need any further workup at this time.  Benign paroxysmal positional vertigo Continue prn Antivert  Anxiety Continue preadmission Lexapro and Xanax  History of CVA (cerebrovascular accident) Continue aspirin and Plavix.   HLD (hyperlipidemia) Okay to resume statin at discharge.  Anemia of chronic disease Presented with a hemoglobin of 13 with a baseline of around 9.7-10 concerning for hemoconcentration from dehydration. After given fluids in the ER hemoglobin down to 9.9. Was on oral iron prior to admission , which can be resumed at discharge. Ferritin level 211.  Protein-calorie malnutrition, severe Nutrition consultation.   DVT Prophylaxis: Lovenox    Code Status: Full code  Family Communication: Discussed with patient's daughter over the phone. Disposition Plan: Management as outlined above. Await palliative medicine input. Await stabilization of blood glucose levels. Anticipate discharge to SNF tomorrow.   LOS: 4 days   [facility]  [facility] Pager [phone] 10/21/2016, 8:05 AM  If 7PM-7AM, please contact night-coverage at [email or web address], password [facility]

## 2016-10-22 DIAGNOSIS — A0472 Enterocolitis due to Clostridium difficile, not specified as recurrent: Secondary | ICD-10-CM | POA: Diagnosis not present

## 2016-10-22 DIAGNOSIS — R197 Diarrhea, unspecified: Secondary | ICD-10-CM | POA: Diagnosis not present

## 2016-10-22 LAB — CULTURE, BLOOD (ROUTINE X 2)
CULTURE: NO GROWTH
Culture: NO GROWTH

## 2016-10-22 LAB — GLUCOSE, CAPILLARY
GLUCOSE-CAPILLARY: 342 mg/dL — AB (ref 65–99)
GLUCOSE-CAPILLARY: 186 mg/dL — AB (ref 65–99)
Glucose-Capillary: 384 mg/dL — ABNORMAL HIGH (ref 65–99)
Glucose-Capillary: 119 mg/dL — ABNORMAL HIGH (ref 65–99)

## 2016-10-22 MED ORDER — INSULIN GLARGINE 100 UNIT/ML ~~LOC~~ SOLN: 3.0000 [IU] | Freq: Every day | SUBCUTANEOUS | 11 refills | Status: DC

## 2016-10-22 MED ORDER — GLUCERNA SHAKE PO LIQD: 237.0000 mL | Freq: Three times a day (TID) | ORAL | 0 refills | Status: DC

## 2016-10-22 MED ORDER — INSULIN GLARGINE 100 UNIT/ML ~~LOC~~ SOLN
3.0000 [IU] | Freq: Every day | SUBCUTANEOUS | Status: DC
  Administered 2016-10-22 – 2016-10-23 (×2): 3 [IU] via SUBCUTANEOUS
  Filled 2016-10-22 (×2): qty 0.03

## 2016-10-22 MED ORDER — VANCOMYCIN 50 MG/ML ORAL SOLUTION: 125.0000 mg | Freq: Four times a day (QID) | ORAL | Status: DC

## 2016-10-22 MED ORDER — FAMOTIDINE 20 MG PO TABS: 20.0000 mg | ORAL_TABLET | Freq: Every day | ORAL | Status: DC

## 2016-10-22 MED ORDER — TRAMADOL HCL 50 MG PO TABS: 50.0000 mg | ORAL_TABLET | Freq: Four times a day (QID) | ORAL | 0 refills | Status: DC | PRN

## 2016-10-22 MED ORDER — INSULIN ASPART 100 UNIT/ML ~~LOC~~ SOLN: SUBCUTANEOUS | 11 refills | Status: DC

## 2016-10-22 MED ORDER — ACETAMINOPHEN 325 MG PO TABS: 650.0000 mg | ORAL_TABLET | Freq: Four times a day (QID) | ORAL | Status: DC | PRN

## 2016-10-22 NOTE — Clinical Social Work Note (Signed)
Patient medically stable for discharge today and facility selected was [facility]. CSW received call from [place] [facility] late [facility] (after daughter left facility) regarding situation. Per Albin Fellingarla, daughter [name](HCPOA) is adamantly against rehab for patient, however cannot pay privately for LTC and has not applied for Medicaid. Per Albin Fellingarla, daughter talked with MD by phone while at facility and became angry during this conversation. Albin FellingCarla advised CSW that she talked with daughter regarding what insurance will pay for in terms of rehab versus long-term care at a skilled facility. CSW will continue to follow and assist with discharge planning for patient with family.  Genelle BalVanessa Khari Mally, MSW, LCSW Licensed Clinical Social Worker Clinical Social Work Department [facility] [phone]

## 2016-10-22 NOTE — Discharge Summary (Signed)
[facility]  Physician Discharge Summary   Patient ID: Cassandra [name] MRN: [id number] DOB/AGE: 69-Oct-1947 69 y.o.  Admit date: 10/16/2016 Discharge date: 10/22/2016  PCP: Irving Copas, MD  DISCHARGE DIAGNOSES:  Principal Problem:   C. difficile diarrhea Active Problems:   Insulin dependent type 2 diabetes mellitus, uncontrolled (HCC)   Hypertension   Uncontrolled Hypothyroidism   Benign paroxysmal positional vertigo   Anxiety   History of CVA (cerebrovascular accident)   HLD (hyperlipidemia)   Anemia of chronic disease   Physical deconditioning   Left ventricular diastolic dysfunction, NYHA class 1   Recent UTI (urinary tract infection)   Protein-calorie malnutrition, severe   Recent Gram-negative bacteremia   Acute kidney injury (HCC)   Cervical vertebral fracture (HCC)   Thoracic vertebral fracture (HCC)   Fall at nursing home   Common bile duct dilatation   Palliative care encounter   Goals of care, counseling/discussion   RECOMMENDATIONS FOR OUTPATIENT FOLLOW UP: CBC and basic metabolic panel next week.  Patient is to follow-up with Dr. Jordan Likes with neurosurgery in 1-2 week for further evaluation of her cervical vertebral fracture.  She needs to wear the hard collar on her neck at all times.  Please monitor CBGs 4 times a day (before each meal and at bedtime). She is very sensitive to insulin and appears to have brittle diabetes. Titrate Lantus very gradually.  Palliative medicine to follow at the skilled nursing facility. TSH and free T4 to be done no earlier than 4 weeks.   DISCHARGE CONDITION: fair  Diet recommendation: Modified carbohydrate  Filed Weights   10/20/16 2133 10/21/16 2106 10/22/16 0500  Weight: 60.9 kg (134 lb 4.2 oz) 60.7 kg (133 lb 13.1 oz) 57.1 kg (125 lb 14.1 oz)    INITIAL HISTORY: 70 year old Caucasian female with a past medical history of hypothyroidism, diabetes, history of prior CVA, chronic kidney disease stage  III, anemia of chronic disease with recent hospitalization for Enterobacter UTI with associated bacteremia. She was discharged to skilled nursing facility. Apparently she was being discharged from there back home when she had a fall. She was brought into the emergency department and found to have cervical vertebrae fracture. She was also found to have acute renal failure. She also mentioned watery diarrhea. She was hospitalized for further management.  Consultations: Phone discussion with neurosurgery   HOSPITAL COURSE:   Acute kidney injury/Chronickidney disease stage III Patient presented with ongoing diarrhea and volume loss, poor oral intake as well as hyperglycemia related volume losses with mild acute kidney injury. Patient was gently hydrated. Renal function is close to baseline. Holding offending medications including diuretics/ARB/no NSAIDs. Monitor urine output.  Acute C. difficile diarrhea C. difficile antigen was positive and toxin was negative. Positive PCR. Since patient was having diarrhea and was in the hospital recently and was on antibiotics, it was reasonable to consider this an acute infection and so she was started on treatment with oral vancomycin. Patient is responding to treatment. Diarrhea appears to have resolved.  Insulin dependent type 2 diabetes mellitus, uncontrolled Presented with hyperglycemia with CBGs greater than 300 andgreater than 500 overnight. Possibly precipitated by combination of dehydration and suspected underlying infectious processes. She was started back on her Lantus. Subsequently, she developed hypoglycemia. Patient was placed on her D5 infusion. CBGs appear to be improving. She appears to have very brittle diabetes. Plus the fact that her oral intake is inconsistent makes treating this very challenging. She'll be given her usual dose of NovoLog as per sliding  scale. She was monitored off of any long-acting insulin yesterday. Just with sliding scale  coverage her CBGs went down to 100 at nighttime. Again, up to 384 this morning. She'll be given a small dose of Lantus and will be continued on that. CBGs will need to monitored at the skilled nursing facility with further adjustments to be made to her insulin dosage based on CBG's. Hemoglobin A1c on 09/20/16 was 9.2.  Uncontrolled Hypothyroidism According to daughter, patient has been on generic Synthroid for unknown period of time and at last admission presented with a TSH of 89 with a low T4. TSH repeated while in the ER and has increased further to 135 with a free T4 of 1.18. Free T4 had shown improvement compared to previous levels. Continue with Synthroid 100 g daily. Will need repeat thyroid function tests only in 4-6 weeks. May benefit from outpatient endocrinology follow-up. We'll recommend treating with brand name Synthroid.  Recent UTI (urinary tract infection)/Recent Gram-negative bacteremia (Enterobacter) Repeat urinalysis unremarkable and has completed Vantin. Leukocytosis, resolved. Urine cultures and blood cultures negative.  Essential Hypertension Holding Cozaar due to renal failure. Continue only amlodipine.  Lesion dorsum right hand Localized lesion with redness and fluctuance at siteof apparent previous IV. No streaking or other signs of cellulitis and no palpable cording concerning for superficial thrombophlebitis. Warm compresses. Lesion is improving.  Physical deconditioning PT/OT evaluation. Will need to return to a skilled nursing facility. Discussed in detail with patient's daughter. She is interested in having discussion with palliative medicine. Palliative medicine was consulted. Patient now DO NOT RESUSCITATE. Palliative medicine to continue to follow at the skilled nursing facility..  Left ventricular diastolic dysfunction, NYHA class 1 Currently compensated without evidence of heart failure.  Cervical vertebral fracture/Thoracic vertebral fracture/Fall at  nursing home EDPspoke with neurosurgery who stated these fractures do not require surgical intervention and neurosurgeon recommended hard cervical collar with later follow-up as an outpatient with neurosurgery. Outpatient follow-up with Dr. Jordan LikesPool who has previously operated on this patient.  Dilated CBD During previous admission had abdominal ultrasound that revealed a 10 mm dilated common bile duct and at that time had abnormal transaminases. As evaluated by [facility] with recommendations to follow-up in one week and obtain an outpatient MRCP (see discharge summary). MRCP was done this hospitalization and shows mildly dilated CBD without any choledocholithiasis or other abnormalities. Thought to be related to previous cholecystectomy. Does not need any further workup at this time.  Benign paroxysmal positional vertigo Continue prnAntivert  Anxiety Continue preadmission Lexapro and Xanax  History of CVA (cerebrovascular accident) Continue aspirin and Plavix.   HLD (hyperlipidemia) Okay to resume statin at discharge.  Anemia of chronic disease Presented with a hemoglobin of 13 with a baseline of around 9.7-10 concerning for hemoconcentration from dehydration. After given fluids in the ER hemoglobin down to 9.9. Was on oral iron prior to admission , which can be resumed at discharge. Ferritin level 211.  Protein-calorie malnutrition, severe Glucerna  Overall, stable. Okay for discharge to SNF today.    PERTINENT LABS:  The results of significant diagnostics from this hospitalization (including imaging, microbiology, ancillary and laboratory) are listed below for reference.    Microbiology: Recent Results (from the past 240 hour(s))  Culture, Urine     Status: None   Collection Time: 10/16/16 11:06 AM  Result Value Ref Range Status   Specimen Description URINE, RANDOM  Final   Special Requests ADDED [id number]  Final   Culture NO GROWTH  Final  Report Status 10/18/2016  FINAL  Final  C difficile quick screen w PCR reflex     Status: Abnormal   Collection Time: 10/17/16  8:08 AM  Result Value Ref Range Status   C Diff antigen POSITIVE (A) NEGATIVE Final   C Diff toxin NEGATIVE NEGATIVE Final   C Diff interpretation Results are indeterminate. See PCR results.  Final  Clostridium Difficile by PCR     Status: Abnormal   Collection Time: 10/17/16  8:08 AM  Result Value Ref Range Status   Toxigenic C Difficile by pcr POSITIVE (A) NEGATIVE Final    Comment: Positive for toxigenic C. difficile with little to no toxin production. Only treat if clinical presentation suggests symptomatic illness.  Culture, blood (Routine X 2) w Reflex to ID Panel     Status: None (Preliminary result)   Collection Time: 10/17/16  9:00 AM  Result Value Ref Range Status   Specimen Description BLOOD RIGHT HAND  Final   Special Requests IN PEDIATRIC BOTTLE 1CC  Final   Culture NO GROWTH 4 DAYS  Final   Report Status PENDING  Incomplete  Culture, blood (Routine X 2) w Reflex to ID Panel     Status: None (Preliminary result)   Collection Time: 10/17/16  9:12 AM  Result Value Ref Range Status   Specimen Description BLOOD WRIST LEFT  Final   Special Requests IN PEDIATRIC BOTTLE 2CC  Final   Culture NO GROWTH 4 DAYS  Final   Report Status PENDING  Incomplete     Labs: Basic Metabolic Panel:  Recent Labs Lab 10/16/16 1055 10/17/16 0725 10/18/16 0316 10/19/16 0614 10/21/16 0552  NA 137 136 139 136 135  K 4.6 3.5 3.6 4.0 4.6  CL 103 106 109 109 103  CO2 22 25 24 22 22   GLUCOSE 376* 147* 109* 126* 385*  BUN 14 17 14 14 12   CREATININE 1.38* 1.31* 1.13* 1.31* 1.41*  CALCIUM 8.8* 7.5* 7.3* 7.4* 7.7*   Liver Function Tests:  Recent Labs Lab 10/16/16 1055 10/18/16 0316  AST 33 18  ALT 39 19  ALKPHOS 154* 85  BILITOT 0.7 0.1*  PROT 6.0* 3.5*  ALBUMIN 2.4* 1.4*   CBC:  Recent Labs Lab 10/16/16 1055 10/17/16 0725 10/18/16 0316 10/19/16 0614 10/21/16 0552  WBC  15.2* 9.3 8.7 7.9 9.0  NEUTROABS 13.5*  --   --   --   --   HGB 13.8 9.9* 8.5* 8.7* 9.1*  HCT 40.6 29.7* 25.6* 26.2* 27.8*  MCV 89.4 88.1 90.5 90.3 89.7  PLT 340 302 258 249 298   BNP: BNP (last 3 results)  Recent Labs  09/26/16 1652 09/28/16 1720 10/16/16 1055  BNP 179.0* 236.0* 277.7*    CBG:  Recent Labs Lab 10/21/16 1151 10/21/16 1436 10/21/16 1624 10/21/16 2111 10/22/16 0740  GLUCAP 293* 242* 204* 119* 384*     IMAGING STUDIES Dg Chest 2 View  Result Date: 10/16/2016 CLINICAL DATA:  Chest pain, diarrhea for 1 week EXAM: CHEST  2 VIEW COMPARISON:  10/05/2016 FINDINGS: Cardiomediastinal silhouette is stable. There is small bilateral pleural effusion. Bilateral basilar atelectasis or infiltrate. Small fluid is noted in right fissure. No pulmonary edema. Mild degenerative changes thoracic spine. IMPRESSION: Small bilateral pleural effusion. Bilateral basilar atelectasis or infiltrate. No pulmonary edema. Electronically Signed   By: Natasha Mead M.D.   On: 10/16/2016 11:38   Ct Head Wo Contrast  Addendum Date: 10/16/2016   ADDENDUM REPORT: 10/16/2016 12:37 ADDENDUM: The original report was  by Dr. Gaylyn Rong. The following addendum is by Dr. Gaylyn Rong: These results were called by telephone at the time of interpretation on 10/16/2016 at 12:31 pm to Dr. Crista Curb , who verbally acknowledged these results. Electronically Signed   By: Gaylyn Rong M.D.   On: 10/16/2016 12:37   Result Date: 10/16/2016 CLINICAL DATA:  Fall this past Sunday. Posterior headache. Progressive weakness. Diabetes. EXAM: CT HEAD WITHOUT CONTRAST CT CERVICAL SPINE WITHOUT CONTRAST TECHNIQUE: Multidetector CT imaging of the head and cervical spine was performed following the standard protocol without intravenous contrast. Multiplanar CT image reconstructions of the cervical spine were also generated. COMPARISON:  09/06/2016 FINDINGS: CT HEAD FINDINGS Brain: Remote 4 mm lacunar infarct in the  left cerebral peduncle extending into the left thalamus. Periventricular white matter and corona radiata hypodensities favor chronic ischemic microvascular white matter disease. Prior graft otherwise, The brainstem, cerebellum, cerebral peduncles, thalami, basal ganglia, basilar cisterns, and ventricular system appear within normal limits. No intracranial hemorrhage, mass lesion, or acute CVA. Vascular: There is atherosclerotic calcification of the cavernous carotid arteries bilaterally. Skull: Unremarkable Sinuses/Orbits: Mild chronic ethmoid, maxillary, and sphenoid sinusitis. Hypoplastic frontal sinuses. Other: Right posterior parietal scalp soft tissue swelling. CT CERVICAL SPINE FINDINGS Alignment: 2.5 mm degenerative anterolisthesis at C4-5, not changed from prior. Skull base and vertebrae: Fracture through the base of the anterior osteophyte at the left anterior C6 vertebral level as on image 55/12. Interval coronal vertically oriented fracture through the anterior portion of the C7 vertebral body as shown on images 25 through 36 series 11, separating the anterior vertebral body and bridging anterior osteophytes from the posterior vertebral body. At T1, there is a fracture extending through the anterior osteophytes along the anterior cortical margin of the vertebral, with some cortical involvement for example on image 33/11. On the prior exam these osteophytes were essentially incorporated into the anterior vertebral body. Fracture through the base of the anterior osteophyte at T2 along the superior endplate. No posterior element involvement identified. ACDF at C5-6. Soft tissues and spinal canal: Mild stranding along the left lower carotid space likely related to the fractures. Disc levels: At C2-3 there is a posterior osteophyte causing mild central narrowing of the thecal sac. Uncinate and facet spurring cause right foraminal impingement at C3-4 and to a lesser degree at C4-5 and C6-7 ; and left foraminal  stenosis at C2-3. Probable disc bulge at the C6-7 level. Upper chest: Bilateral pleural effusions. Other: Old deformity of the right medial clavicle. IMPRESSION: 1. New acute anterior fractures at C6, C7, T1, and T2. At the C7 level the fracture represents a coronally oriented fracture through the anterior vertebral body. At the other levels, the fracture represents vertically oriented coronal fractures extending through the anterior bridging osteophytes. There is adjacent soft tissue edema more notably tracking in the left lower carotid space. No acute subluxation. No posterior element involvement. 2. Chronic 2.5 mm of degenerative anterolisthesis at C4-5. ACDF at C5-6. 3. Old fracture of the right medial clavicle, healed, with secondary degenerative right sternoclavicular arthropathy. 4. Bilateral pleural effusions. 5. Cervical spondylosis and degenerative disc disease causing impingement at C2- 3, C3-4, C4-5, and C6-7. 6. Intracranially, there is evidence of chronic microvascular white matter disease and a remote lacunar infarct involving the left thalamus and left cerebral peduncle, but no acute intracranial findings. 7. Chronic paranasal sinusitis. 8. Right posterior parietal scalp soft tissue swelling. Radiology assistant personnel have been notified to put me in telephone contact with the referring physician or  the referring physician's clinical representative in order to discuss these findings. Once this communication is established I will issue an addendum to this report for documentation purposes. Electronically Signed: By: Gaylyn Rong M.D. On: 10/16/2016 12:29   Ct Cervical Spine Wo Contrast  Addendum Date: 10/16/2016   ADDENDUM REPORT: 10/16/2016 12:37 ADDENDUM: The original report was by Dr. Gaylyn Rong. The following addendum is by Dr. Gaylyn Rong: These results were called by telephone at the time of interpretation on 10/16/2016 at 12:31 pm to Dr. Crista Curb , who verbally  acknowledged these results. Electronically Signed   By: Gaylyn Rong M.D.   On: 10/16/2016 12:37   Result Date: 10/16/2016 CLINICAL DATA:  Fall this past Sunday. Posterior headache. Progressive weakness. Diabetes. EXAM: CT HEAD WITHOUT CONTRAST CT CERVICAL SPINE WITHOUT CONTRAST TECHNIQUE: Multidetector CT imaging of the head and cervical spine was performed following the standard protocol without intravenous contrast. Multiplanar CT image reconstructions of the cervical spine were also generated. COMPARISON:  09/06/2016 FINDINGS: CT HEAD FINDINGS Brain: Remote 4 mm lacunar infarct in the left cerebral peduncle extending into the left thalamus. Periventricular white matter and corona radiata hypodensities favor chronic ischemic microvascular white matter disease. Prior graft otherwise, The brainstem, cerebellum, cerebral peduncles, thalami, basal ganglia, basilar cisterns, and ventricular system appear within normal limits. No intracranial hemorrhage, mass lesion, or acute CVA. Vascular: There is atherosclerotic calcification of the cavernous carotid arteries bilaterally. Skull: Unremarkable Sinuses/Orbits: Mild chronic ethmoid, maxillary, and sphenoid sinusitis. Hypoplastic frontal sinuses. Other: Right posterior parietal scalp soft tissue swelling. CT CERVICAL SPINE FINDINGS Alignment: 2.5 mm degenerative anterolisthesis at C4-5, not changed from prior. Skull base and vertebrae: Fracture through the base of the anterior osteophyte at the left anterior C6 vertebral level as on image 55/12. Interval coronal vertically oriented fracture through the anterior portion of the C7 vertebral body as shown on images 25 through 36 series 11, separating the anterior vertebral body and bridging anterior osteophytes from the posterior vertebral body. At T1, there is a fracture extending through the anterior osteophytes along the anterior cortical margin of the vertebral, with some cortical involvement for example on  image 33/11. On the prior exam these osteophytes were essentially incorporated into the anterior vertebral body. Fracture through the base of the anterior osteophyte at T2 along the superior endplate. No posterior element involvement identified. ACDF at C5-6. Soft tissues and spinal canal: Mild stranding along the left lower carotid space likely related to the fractures. Disc levels: At C2-3 there is a posterior osteophyte causing mild central narrowing of the thecal sac. Uncinate and facet spurring cause right foraminal impingement at C3-4 and to a lesser degree at C4-5 and C6-7 ; and left foraminal stenosis at C2-3. Probable disc bulge at the C6-7 level. Upper chest: Bilateral pleural effusions. Other: Old deformity of the right medial clavicle. IMPRESSION: 1. New acute anterior fractures at C6, C7, T1, and T2. At the C7 level the fracture represents a coronally oriented fracture through the anterior vertebral body. At the other levels, the fracture represents vertically oriented coronal fractures extending through the anterior bridging osteophytes. There is adjacent soft tissue edema more notably tracking in the left lower carotid space. No acute subluxation. No posterior element involvement. 2. Chronic 2.5 mm of degenerative anterolisthesis at C4-5. ACDF at C5-6. 3. Old fracture of the right medial clavicle, healed, with secondary degenerative right sternoclavicular arthropathy. 4. Bilateral pleural effusions. 5. Cervical spondylosis and degenerative disc disease causing impingement at C2- 3, C3-4, C4-5, and  C6-7. 6. Intracranially, there is evidence of chronic microvascular white matter disease and a remote lacunar infarct involving the left thalamus and left cerebral peduncle, but no acute intracranial findings. 7. Chronic paranasal sinusitis. 8. Right posterior parietal scalp soft tissue swelling. Radiology assistant personnel have been notified to put me in telephone contact with the referring physician or  the referring physician's clinical representative in order to discuss these findings. Once this communication is established I will issue an addendum to this report for documentation purposes. Electronically Signed: By: Gaylyn Rong M.D. On: 10/16/2016 12:29   Mr Abdomen Mrcp Wo Contrast  Result Date: 10/18/2016 CLINICAL DATA:  Status post cholecystectomy with dilated CBD EXAM: MRI ABDOMEN WITHOUT CONTRAST  (INCLUDING MRCP) TECHNIQUE: Multiplanar multisequence MR imaging of the abdomen was performed. Heavily T2-weighted images of the biliary and pancreatic ducts were obtained, and three-dimensional MRCP images were rendered by post processing. COMPARISON:  Right upper ultrasound dated 10/08/2016. CT abdomen pelvis dated 09/18/2016. FINDINGS: Motion degraded images. Lower chest: Lung bases are clear. Hepatobiliary: Liver is poorly evaluated due to motion degradation and artifact. No focal hepatic lesion is seen. Status post cholecystectomy. No intrahepatic duct dilatation. Dilated CBD, measuring 11 mm (series 5/ image 18), and smoothly tapering at the ampulla. No choledocholithiasis is seen. Pancreas:  Motion degraded but grossly unremarkable. Spleen:  Within normal limits. Adrenals/Urinary Tract:  Adrenal glands are grossly unremarkable. Kidneys are grossly unremarkable.  No hydronephrosis. Stomach/Bowel: Stomach is within normal limits. Visualized bowel is unremarkable. Vascular/Lymphatic:  No evidence of abdominal aortic aneurysm. No suspicious abdominal lymphadenopathy. Other:  Moderate abdominal ascites. Musculoskeletal: No focal osseous lesion. IMPRESSION: Status post cholecystectomy. Common duct measures 11 mm and smoothly tapers at the ampulla, likely postsurgical. No choledocholithiasis is seen. Electronically Signed   By: Charline Bills M.D.   On: 10/18/2016 10:09     DISCHARGE EXAMINATION: Vitals:   10/21/16 2106 10/22/16 0500 10/22/16 0548 10/22/16 1000  BP: (!) 134/58  139/78 (!)  143/72  Pulse: 70  87 82  Resp: 18  17 17   Temp: 98.5 F (36.9 C)  98.3 F (36.8 C) 98.3 F (36.8 C)  TempSrc: Oral  Oral Oral  SpO2: 97%  95% 95%  Weight: 60.7 kg (133 lb 13.1 oz) 57.1 kg (125 lb 14.1 oz)    Height:       General appearance: alert, cooperative, appears stated age and no distress Resp: clear to auscultation bilaterally Cardio: regular rate and rhythm, S1, S2 normal, no murmur, click, rub or gallop GI: soft, non-tender; bowel sounds normal; no masses,  no organomegaly  DISPOSITION: SNF  Discharge Instructions    Call MD for:  extreme fatigue    Complete by:  As directed    Call MD for:  persistant dizziness or light-headedness    Complete by:  As directed    Call MD for:  persistant nausea and vomiting    Complete by:  As directed    Call MD for:  severe uncontrolled pain    Complete by:  As directed    Call MD for:  temperature >100.4    Complete by:  As directed    Diet Carb Modified    Complete by:  As directed    Discharge instructions    Complete by:  As directed    CBC and basic metabolic panel next week. Patient is to follow-up with Dr. Jordan Likes with neurosurgery in 2 weeks time for further evaluation of her cervical vertebral fracture. She needs to  wear the hard collar on her neck at all times. Please monitor CBGs 4 times a day before each meal and at bedtime. She is very sensitive to insulin and appears to have brittle diabetes. Titrate Lantus very gradually. Palliative medicine to follow at the skilled nursing facility.  You were cared for by a hospitalist during your hospital stay. If you have any questions about your discharge medications or the care you received while you were in the hospital after you are discharged, you can call the unit and asked to speak with the hospitalist on call if the hospitalist that took care of you is not available. Once you are discharged, your primary care physician will handle any further medical issues. Please note that NO  REFILLS for any discharge medications will be authorized once you are discharged, as it is imperative that you return to your primary care physician (or establish a relationship with a primary care physician if you do not have one) for your aftercare needs so that they can reassess your need for medications and monitor your lab values. If you do not have a primary care physician, you can call [phone] for a physician referral.   Increase activity slowly    Complete by:  As directed       ALLERGIES:  Allergies  Allergen Reactions  . Alprazolam Other (See Comments)    Family preference, for patient to not take med  . Percocet [Oxycodone-Acetaminophen] Other (See Comments)    Family preference, for patient to not take med  . Codeine Diarrhea and Nausea And Vomiting  . Doxycycline Diarrhea and Nausea And Vomiting  . Hydrocodone Nausea And Vomiting  . Omnicef [Cefdinir] Nausea Only and Other (See Comments)    Constipation, tolerated Zosyn  . Augmentin [Amoxicillin-Pot Clavulanate] Hives and Rash    Has patient had a PCN reaction causing immediate rash, facial/tongue/throat swelling, SOB or lightheadedness with hypotension: Yes Has patient had a PCN reaction causing severe rash involving mucus membranes or skin necrosis: Yes Did a PCN reaction that required hospitalization No Did PCN reaction occurring within the last 10 years: Yes If all of the above answers are "NO", then may proceed with Cephalosporin use.  Pt states she has taken penicillin since, and was ok with it...  . Ciprofloxacin Hives     Current Discharge Medication List    START taking these medications   Details  acetaminophen (TYLENOL) 325 MG tablet Take 2 tablets (650 mg total) by mouth every 6 (six) hours as needed for mild pain (or Fever >/= 101).    famotidine (PEPCID) 20 MG tablet Take 1 tablet (20 mg total) by mouth daily.    feeding supplement, GLUCERNA SHAKE, (GLUCERNA SHAKE) LIQD Take 237 mLs by mouth 3 (three)  times daily between meals. Refills: 0    insulin aspart (NOVOLOG) 100 UNIT/ML injection Sliding Scale Coverage three times a day. No HS coverage. CBG < 70: implement hypoglycemia protocol CBG 70 - 120: 0 units CBG 121 - 150: 1 unit CBG 151 - 200: 2 units CBG 201 - 250: 3 units CBG 251 - 300: 5 units CBG 301 - 350: 7 units CBG 351 - 400: 9 units Qty: 10 mL, Refills: 11    insulin glargine (LANTUS) 100 UNIT/ML injection Inject 0.03 mLs (3 Units total) into the skin daily. Qty: 10 mL, Refills: 11    traMADol (ULTRAM) 50 MG tablet Take 1 tablet (50 mg total) by mouth every 6 (six) hours as needed. Qty: 30 tablet,  Refills: 0    vancomycin (VANCOCIN) 50 mg/mL oral solution Take 2.5 mLs (125 mg total) by mouth 4 (four) times daily. For 11 more days      CONTINUE these medications which have CHANGED   Details  SYNTHROID 100 MCG tablet Take 1 tablet (100 mcg total) by mouth daily before breakfast. Use only brand name medication.      CONTINUE these medications which have NOT CHANGED   Details  amLODipine (NORVASC) 5 MG tablet Take 5 mg by mouth daily.    aspirin 81 MG chewable tablet Chew 1 tablet (81 mg total) by mouth daily. Qty: 30 tablet, Refills: 1    atorvastatin (LIPITOR) 10 MG tablet Take 1 tablet (10 mg total) by mouth daily at 6 PM. Qty: 30 tablet, Refills: 0    carvedilol (COREG) 3.125 MG tablet Take 1 tablet (3.125 mg total) by mouth 2 (two) times daily with a meal. Qty: 60 tablet, Refills: 0    clopidogrel (PLAVIX) 75 MG tablet Take 75 mg by mouth daily.    escitalopram (LEXAPRO) 20 MG tablet Take 20 mg by mouth at bedtime.     iron polysaccharides (NIFEREX) 150 MG capsule Take 1 capsule (150 mg total) by mouth 2 (two) times daily. Qty: 60 capsule, Refills: 1    meclizine (ANTIVERT) 25 MG tablet Take 1 tablet (25 mg total) by mouth 3 (three) times daily as needed for dizziness. Qty: 30 tablet, Refills: 0      STOP taking these medications     HUMALOG 100  UNIT/ML injection      hydrochlorothiazide (HYDRODIURIL) 25 MG tablet      losartan (COZAAR) 25 MG tablet      pantoprazole (PROTONIX) 40 MG tablet      polyethylene glycol (MIRALAX / GLYCOLAX) packet      senna-docusate (SENOKOT-S) 8.6-50 MG tablet      cefpodoxime (VANTIN) 200 MG tablet      LORazepam (ATIVAN) 0.5 MG tablet          Follow-up Information    POOL,HENRY A, MD. Schedule an appointment as soon as possible for a visit in 2 week(s).   Specialty:  Neurosurgery Why:  neurosurgery follow-up for cervical fracture. Contact information: [address] [place] [address] [phone]        Irving Copas, MD. Schedule an appointment as soon as possible for a visit in 1 week(s).   Specialty:  Family Medicine Contact information: [address] [place] [address] [phone]           TOTAL DISCHARGE TIME: 35 minutes  [facility]  [facility] Pager [phone]  10/22/2016, 11:24 AM

## 2016-10-22 NOTE — Progress Notes (Signed)
Inpatient Diabetes Program Recommendations  AACE/ADA: New Consensus Statement on Inpatient Glycemic Control (2015)  Target Ranges:  Prepandial:   less than 140 mg/dL      Peak postprandial:   less than 180 mg/dL (1-2 hours)      Critically ill patients:  140 - 180 mg/dL   Lab Results  Component Value Date   GLUCAP 384 (H) 10/22/2016   HGBA1C 9.2 (H) 09/20/2016    Review of Glycemic Control  Results for Cassandra JerseyMCFAYDEN, Legacy A (MRN [id number]) as of 10/22/2016 11:11  Ref. Range 10/21/2016 11:51 10/21/2016 14:36 10/21/2016 16:24 10/21/2016 21:11 10/22/2016 07:40  Glucose-Capillary Latest Ref Range: 65 - 99 mg/dL 956293 (H) 213242 (H) 086204 (H) 119 (H) 384 (H)     Diabetes history: Type 1  Outpatient Diabetes medications: Humalog 5 units tid, Lantus 8 units qhs, Novolog 0-9 units tid  Current orders for Inpatient glycemic control: Novolog 0-9 units tid, Lantus 3 units started today  Inpatient Diabetes Program Recommendations: Consider  Novolog 0-5 units qhs  Susette RacerJulie Colter Magowan, RN, [place], [place]HA, CDE Diabetes Coordinator Inpatient Glycemic Control Team [phone] (Team Pager) [phone] [facility] Office) 10/22/2016 11:13 AM

## 2016-10-22 NOTE — Discharge Instructions (Signed)
Your have fractures involving the cervical spine of the neck. Our neurosurgeon states this is stable, and you need to follow-up with your neurosurgeon as outpatient (see above). You have no major limitations on activity.

## 2016-10-23 DIAGNOSIS — D638 Anemia in other chronic diseases classified elsewhere: Secondary | ICD-10-CM

## 2016-10-23 DIAGNOSIS — H8110 Benign paroxysmal vertigo, unspecified ear: Secondary | ICD-10-CM

## 2016-10-23 DIAGNOSIS — N179 Acute kidney failure, unspecified: Secondary | ICD-10-CM

## 2016-10-23 DIAGNOSIS — E785 Hyperlipidemia, unspecified: Secondary | ICD-10-CM

## 2016-10-23 DIAGNOSIS — A0472 Enterocolitis due to Clostridium difficile, not specified as recurrent: Secondary | ICD-10-CM | POA: Diagnosis not present

## 2016-10-23 DIAGNOSIS — F419 Anxiety disorder, unspecified: Secondary | ICD-10-CM

## 2016-10-23 DIAGNOSIS — Z8673 Personal history of transient ischemic attack (TIA), and cerebral infarction without residual deficits: Secondary | ICD-10-CM

## 2016-10-23 DIAGNOSIS — R197 Diarrhea, unspecified: Secondary | ICD-10-CM | POA: Diagnosis not present

## 2016-10-23 DIAGNOSIS — Y92129 Unspecified place in nursing home as the place of occurrence of the external cause: Secondary | ICD-10-CM

## 2016-10-23 DIAGNOSIS — W19XXXA Unspecified fall, initial encounter: Secondary | ICD-10-CM

## 2016-10-23 LAB — GLUCOSE, CAPILLARY
GLUCOSE-CAPILLARY: 200 mg/dL — AB (ref 65–99)
Glucose-Capillary: 152 mg/dL — ABNORMAL HIGH (ref 65–99)

## 2016-10-23 NOTE — Progress Notes (Signed)
[facility] PROGRESS NOTE  Cassandra [name] WJX:[id number] DOB: 11/09/1946 DOA: 10/16/2016  PCP: Irving CopasHACKER,ROBERT KELLER, MD  Brief History/Interval Summary: 70 year old Caucasian female with a past medical history of hypothyroidism, diabetes, history of prior CVA, chronic kidney disease stage III, anemia of chronic disease with recent hospitalization for Enterobacter UTI with associated bacteremia. She was discharged to skilled nursing facility. Apparently she was being discharged from there back home when she had a fall. She was brought into the emergency department and found to have cervical vertebrae fracture. She was also found to have acute renal failure. She also mentioned watery diarrhea. She was hospitalized for further management.  Reason for Visit: Acute renal failure. C. difficile  Consultants: Phone discussion with neurosurgery   Subjective/Interval History: Reported that she is still having diarrhea.  Assessment/Plan:  Principal Problem:   C. difficile diarrhea Active Problems:   Insulin dependent type 2 diabetes mellitus, uncontrolled (HCC)   Hypertension   Uncontrolled Hypothyroidism   Benign paroxysmal positional vertigo   Anxiety   History of CVA (cerebrovascular accident)   HLD (hyperlipidemia)   Anemia of chronic disease   Physical deconditioning   Left ventricular diastolic dysfunction, NYHA class 1   Recent UTI (urinary tract infection)   Protein-calorie malnutrition, severe   Recent Gram-negative bacteremia   Acute kidney injury (HCC)   Cervical vertebral fracture [facility])   Thoracic vertebral fracture (HCC)   Fall at nursing home   Common bile duct dilatation   Palliative care encounter   Goals of care, counseling/discussion   Patient seen and examined, and data base reviewed. Patient seen yesterday by my colleague Dr. Rito EhrlichKrishnan, discharged to nursing home, was not able to transfer. Recently treated for Enterobacter bacteremia, came in with  acute on chronic renal failure and C. difficile colitis. Multiple comorbidities including previous CVA, C-spine fracture, BPPV and diabetes. Improving, to be discharged to nursing home, to be followed by palliative service.  DVT Prophylaxis: Lovenox    Code Status: Full code  Family Communication:  Disposition Plan: To SNF today.  Procedures: None  Antibiotics: Oral vancomycin  ROS: Denies any chest pain or shortness of breath  Objective:  Vital Signs  Vitals:   10/22/16 2152 10/23/16 0500 10/23/16 0541 10/23/16 1001  BP: (!) 154/65  (!) 161/70 (!) 141/62  Pulse: 78  84 77  Resp: 16  16 18   Temp: 98.9 F (37.2 C)  99 F (37.2 C) 98.9 F (37.2 C)  TempSrc: Oral  Oral Oral  SpO2: 98%  95% 97%  Weight: 57 kg (125 lb 10.6 oz) 60.6 kg (133 lb 9.6 oz)    Height:        Intake/Output Summary (Last 24 hours) at 10/23/16 1153 Last data filed at 10/23/16 1001  Gross per 24 hour  Intake              220 ml  Output                0 ml  Net              220 ml   Filed Weights   10/22/16 0500 10/22/16 2152 10/23/16 0500  Weight: 57.1 kg (125 lb 14.1 oz) 57 kg (125 lb 10.6 oz) 60.6 kg (133 lb 9.6 oz)    General appearance: alert, Somewhat distracted. In no distress. Resp: clear to auscultation bilaterally Cardio: regular rate and rhythm, S1, S2 normal, no murmur, click, rub or gallop GI: soft, non-tender; bowel sounds normal; no masses,  no organomegaly Neurologic: Awake and alert. Mildly distracted. No focal neurological deficits. She does have a cervical neck collar.  Lab Results:  Data Reviewed: I have personally reviewed following labs and imaging studies  CBC:  Recent Labs Lab 10/17/16 0725 10/18/16 0316 10/19/16 0614 10/21/16 0552  WBC 9.3 8.7 7.9 9.0  HGB 9.9* 8.5* 8.7* 9.1*  HCT 29.7* 25.6* 26.2* 27.8*  MCV 88.1 90.5 90.3 89.7  PLT 302 258 249 298    Basic Metabolic Panel:  Recent Labs Lab 10/17/16 0725 10/18/16 0316 10/19/16 0614 10/21/16 0552    NA 136 139 136 135  K 3.5 3.6 4.0 4.6  CL 106 109 109 103  CO2 25 24 22 22   GLUCOSE 147* 109* 126* 385*  BUN 17 14 14 12   CREATININE 1.31* 1.13* 1.31* 1.41*  CALCIUM 7.5* 7.3* 7.4* 7.7*    GFR: Estimated Creatinine Clearance: 32.3 mL/min (by C-G formula based on SCr of 1.41 mg/dL (H)).  Liver Function Tests:  Recent Labs Lab 10/18/16 0316  AST 18  ALT 19  ALKPHOS 85  BILITOT 0.1*  PROT 3.5*  ALBUMIN 1.4*    CBG:  Recent Labs Lab 10/22/16 0740 10/22/16 1207 10/22/16 1658 10/22/16 2126 10/23/16 0821  GLUCAP 384* 342* 186* 119* 200*     Anemia Panel: No results for input(s): VITAMINB12, FOLATE, FERRITIN, TIBC, IRON, RETICCTPCT in the last 72 hours.  Recent Results (from the past 240 hour(s))  Culture, Urine     Status: None   Collection Time: 10/16/16 11:06 AM  Result Value Ref Range Status   Specimen Description URINE, RANDOM  Final   Special Requests ADDED [id number]  Final   Culture NO GROWTH  Final   Report Status 10/18/2016 FINAL  Final  C difficile quick screen w PCR reflex     Status: Abnormal   Collection Time: 10/17/16  8:08 AM  Result Value Ref Range Status   C Diff antigen POSITIVE (A) NEGATIVE Final   C Diff toxin NEGATIVE NEGATIVE Final   C Diff interpretation Results are indeterminate. See PCR results.  Final  Clostridium Difficile by PCR     Status: Abnormal   Collection Time: 10/17/16  8:08 AM  Result Value Ref Range Status   Toxigenic C Difficile by pcr POSITIVE (A) NEGATIVE Final    Comment: Positive for toxigenic C. difficile with little to no toxin production. Only treat if clinical presentation suggests symptomatic illness.  Culture, blood (Routine X 2) w Reflex to ID Panel     Status: None   Collection Time: 10/17/16  9:00 AM  Result Value Ref Range Status   Specimen Description BLOOD RIGHT HAND  Final   Special Requests IN PEDIATRIC BOTTLE 1CC  Final   Culture NO GROWTH 5 DAYS  Final   Report Status 10/22/2016 FINAL  Final   Culture, blood (Routine X 2) w Reflex to ID Panel     Status: None   Collection Time: 10/17/16  9:12 AM  Result Value Ref Range Status   Specimen Description BLOOD WRIST LEFT  Final   Special Requests IN PEDIATRIC BOTTLE 2CC  Final   Culture NO GROWTH 5 DAYS  Final   Report Status 10/22/2016 FINAL  Final      Radiology Studies: No results found.   Medications:  Scheduled: . amLODipine  5 mg Oral Daily  . aspirin  81 mg Oral Daily  . carvedilol  3.125 mg Oral BID WC  . clopidogrel  75 mg Oral Daily  .  enoxaparin (LOVENOX) injection  40 mg Subcutaneous Q24H  . escitalopram  20 mg Oral Q24H  . famotidine  20 mg Oral Daily  . feeding supplement (GLUCERNA SHAKE)  237 mL Oral TID BM  . insulin aspart  0-9 Units Subcutaneous TID WC  . insulin glargine  3 Units Subcutaneous Daily  . levothyroxine  100 mcg Oral QAC breakfast  . vancomycin  125 mg Oral QID   Continuous:  FAO:ZHYQMVHQIONGE **OR** acetaminophen, dextrose, hydrALAZINE, LORazepam, meclizine, morphine injection, ondansetron **OR** ondansetron (ZOFRAN) IV    LOS: 4 days   [facility]  [facility] Pager [phone] 10/23/2016, 11:53 AM  If 7PM-7AM, please contact night-coverage at [email or web address], password [facility]

## 2016-10-23 NOTE — Final Progress Note (Signed)
IV access was d/c'd. Vitals are stable. Skin is intact except as charted in most recent assessments. Pt to be escorted out by ambulance crew, to be driven [facility].  Attempted to give nurse to nurse report 3x.

## 2016-10-23 NOTE — Progress Notes (Signed)
Patient forgot glasses on the unit after discharge. Called one of the patients daughters and said they would come by at the latest 11/9 to pick up the glasses.

## 2016-10-23 NOTE — Progress Notes (Signed)
Occupational Therapy Treatment Patient Details Name: Cassandra [name] MRN: [id number] DOB: 1946/08/27 Today's Date: 10/23/2016    History of present illness 70 yo female presents with C6, C7, T1, and T2 fxs after a fall.  pt with hx of DM, CVA, BPPV, HTN, CKD, Anxiety, Depression, bipolar, and hx Etoh.     OT comments  Pt continues to require extensive assist with ADLs. Pt unable to maintain upright posture while seated in recliner and required positioning by nursing and OT staff for safe postural position of head.neck in recliner. Ot will continue to follow  Follow Up Recommendations  SNF;Supervision/Assistance - 24 hour    Equipment Recommendations  3 in 1 bedside comode;Tub/shower bench    Recommendations for Other Services      Precautions / Restrictions Precautions Precautions: Fall Precaution Comments: multiple falls  Required Braces or Orthoses: Cervical Brace Cervical Brace: Hard collar;At all times Restrictions Weight Bearing Restrictions: No       Mobility Bed Mobility               General bed mobility comments: pt up in recliner  Transfers Overall transfer level: Needs assistance Equipment used: Rolling walker (2 wheeled) Transfers: Sit to/from UGI CorporationStand;Stand Pivot Transfers Sit to Stand: Min assist;Mod assist Stand pivot transfers: Min assist;Mod assist            Balance     Sitting balance-Leahy Scale: Fair       Standing balance-Leahy Scale: Poor                     ADL Overall ADL's : Needs assistance/impaired                         Toilet Transfer: Grab bars;Minimal assistance;BSC;Stand-pivot   Toileting- Clothing Manipulation and Hygiene: Sit to/from stand;Maximal assistance         General ADL Comments: pt required positoning of head/neck in recliner and pt leaning heavily to L side and unable to self correct he rposture                Cognition   Behavior During Therapy: Flat affect Overall  Cognitive Status: Impaired/Different from baseline Area of Impairment: Attention;Memory;Following commands;Safety/judgement                     Extremity/Trunk Assessment   generalized weakness                        General Comments  Pt lethargic, pleasant    Pertinent Vitals/ Pain       Pain Assessment: No/denies pain  Home Living Family/patient expects to be discharged to:: Skilled nursing facility Living Arrangements: Alone                           Home Equipment: Walker - 2 wheels;Shower seat   Additional Comments: Husband does all cooking, cleaning and laundry as well as grocery shopping.       Prior Functioning/Environment Level of Independence: Needs assistance            Frequency  Min 2X/week        Progress Toward Goals  OT Goals(current goals can now be found in the care plan section)  Progress towards OT goals: OT to reassess next treatment     Plan Discharge plan remains appropriate    nd of Session Equipment Utilized During Treatment:  Gait belt;Cervical collar;Other (comment) (BSC)   Activity Tolerance Patient limited by lethargy   Patient Left in chair;with call bell/phone within reach;with chair alarm set   Nurse Communication Other (comment) (positioning in recliner)    Functional Assessment Tool Used: clinical judgement Functional Limitation: Self care;Other OT primary Self Care Current Status [phone](G8987): At least 40 percent but less than 60 percent impaired, limited or restricted Self Care Goal Status (N5621(G8988): At least 20 percent but less than 40 percent impaired, limited or restricted   Time: [id number] OT Time Calculation (min): 19 min  Charges: OT G-codes **NOT FOR INPATIENT CLASS** Functional Assessment Tool Used: clinical judgement Functional Limitation: Self care;Other OT primary Self Care Current Status [phone](G8987): At least 40 percent but less than 60 percent impaired, limited or restricted Self Care  Goal Status (B2841(G8988): At least 20 percent but less than 40 percent impaired, limited or restricted OT General Charges $OT Visit: 1 Procedure OT Treatments $Therapeutic Activity: 8-22 mins  Galen ManilaSpencer, Burman Bruington Jeanette 10/23/2016, 1:23 PM

## 2016-10-23 NOTE — Progress Notes (Signed)
Inpatient Diabetes Program Recommendations  AACE/ADA: New Consensus Statement on Inpatient Glycemic Control (2015)  Target Ranges:  Prepandial:   less than 140 mg/dL      Peak postprandial:   less than 180 mg/dL (1-2 hours)      Critically ill patients:  140 - 180 mg/dL   Lab Results  Component Value Date   GLUCAP 200 (H) 10/23/2016   HGBA1C 9.2 (H) 09/20/2016    Review of Glycemic Control Results for Cassandra [name], Cassandra A (MRN [id number]) as of 10/23/2016 11:31  Ref. Range 10/22/2016 07:40 10/22/2016 12:07 10/22/2016 16:58 10/22/2016 21:26 10/23/2016 08:21  Glucose-Capillary Latest Ref Range: 65 - 99 mg/dL 409384 (H) 811342 (H) 914186 (H) 119 (H) 200 (H)   Inpatient Diabetes Program Recommendations:  Noted hyperglycemia. Please consider: -Increase Lantus to 5 units daily -Change Novolog correction to custom scale of: 151-200=1 unit 201-250=2 units 251-300=3 units 301-350=4 units  Thank you, Darel HongJudy E. Alahia Whicker, RN, MSN, CDE Inpatient Glycemic Control Team Team Pager [phone] (8am-5pm) 10/23/2016 11:36 AM

## 2016-10-23 NOTE — Clinical Social Work Placement (Signed)
   CLINICAL SOCIAL WORK PLACEMENT  NOTE 10/23/16 - DISCHARGED TO [facility] VIA AMBULANCE   Date:  10/23/2016  Patient Details  Name: Cassandra [name] MRN: [id number] Date of Birth: 08-Aug-1946  Clinical Social Work is seeking post-discharge placement for this patient at the Skilled  Nursing Facility level of care (*CSW will initial, date and re-position this form in  chart as items are completed):  Yes   Patient/family provided with [place] Clinical Social Work Department's list of facilities offering this level of care within the geographic area requested by the patient (or if unable, by the patient's family).  Yes   Patient/family informed of their freedom to choose among providers that offer the needed level of care, that participate in Medicare, Medicaid or managed care program needed by the patient, have an available bed and are willing to accept the patient.  Yes   Patient/family informed of [facility]'s ownership interest in [facility] and [facility], as well as of the fact that they are under no obligation to receive care at these facilities.  PASRR submitted to EDS on       PASRR number received on       Existing PASRR number confirmed on 10/19/16     FL2 transmitted to all facilities in geographic area requested by pt/family on 10/16/16     FL2 transmitted to all facilities within larger geographic area on       Patient informed that his/her managed care company has contracts with or will negotiate with certain facilities, including the following:         10/22/16 - Patient/family informed of bed offers received.  [facility]     Physician recommends and patient chooses bed at      [facility]  10/23/16.  Patient to be transferred to facility by  ambulance  Patient family notified on  10/22/16 of transfer.  Name of family member notified:   [name], daughter.     PHYSICIAN        Additional Comment:    _______________________________________________ Cristobal Goldmannrawford, Atul Delucia Bradley, LCSW 10/23/2016, 2:21 PM

## 2016-10-24 ENCOUNTER — Encounter: Payer: Self-pay | Admitting: Internal Medicine

## 2016-10-24 ENCOUNTER — Non-Acute Institutional Stay (SKILLED_NURSING_FACILITY): Payer: Medicare Other | Admitting: Internal Medicine

## 2016-10-24 DIAGNOSIS — I69391 Dysphagia following cerebral infarction: Secondary | ICD-10-CM

## 2016-10-24 DIAGNOSIS — E440 Moderate protein-calorie malnutrition: Secondary | ICD-10-CM | POA: Diagnosis not present

## 2016-10-24 DIAGNOSIS — K219 Gastro-esophageal reflux disease without esophagitis: Secondary | ICD-10-CM

## 2016-10-24 DIAGNOSIS — E1165 Type 2 diabetes mellitus with hyperglycemia: Secondary | ICD-10-CM | POA: Diagnosis not present

## 2016-10-24 DIAGNOSIS — Z8673 Personal history of transient ischemic attack (TIA), and cerebral infarction without residual deficits: Secondary | ICD-10-CM

## 2016-10-24 DIAGNOSIS — E785 Hyperlipidemia, unspecified: Secondary | ICD-10-CM

## 2016-10-24 DIAGNOSIS — M542 Cervicalgia: Secondary | ICD-10-CM | POA: Diagnosis not present

## 2016-10-24 DIAGNOSIS — N183 Chronic kidney disease, stage 3 unspecified: Secondary | ICD-10-CM

## 2016-10-24 DIAGNOSIS — F329 Major depressive disorder, single episode, unspecified: Secondary | ICD-10-CM

## 2016-10-24 DIAGNOSIS — I5032 Chronic diastolic (congestive) heart failure: Secondary | ICD-10-CM

## 2016-10-24 DIAGNOSIS — I10 Essential (primary) hypertension: Secondary | ICD-10-CM

## 2016-10-24 DIAGNOSIS — D638 Anemia in other chronic diseases classified elsewhere: Secondary | ICD-10-CM | POA: Diagnosis not present

## 2016-10-24 DIAGNOSIS — IMO0002 Reserved for concepts with insufficient information to code with codable children: Secondary | ICD-10-CM

## 2016-10-24 DIAGNOSIS — A0472 Enterocolitis due to Clostridium difficile, not specified as recurrent: Secondary | ICD-10-CM | POA: Diagnosis not present

## 2016-10-24 DIAGNOSIS — R4189 Other symptoms and signs involving cognitive functions and awareness: Secondary | ICD-10-CM

## 2016-10-24 DIAGNOSIS — E038 Other specified hypothyroidism: Secondary | ICD-10-CM

## 2016-10-24 DIAGNOSIS — Z794 Long term (current) use of insulin: Secondary | ICD-10-CM

## 2016-10-24 NOTE — Progress Notes (Signed)
[facility]  PCP: Irving CopasHACKER,ROBERT KELLER, MD   Code Status: DNR  Goals of care: Advanced Directive information Advanced Directives 10/24/2016  Does patient have an advance directive? Yes  Type of Advance Directive Out of facility DNR (pink MOST or yellow form)  Does patient want to make changes to advanced directive? No - Patient declined  Copy of advanced directive(s) in chart? Yes  Would patient like information on creating an advanced directive? -       Extended Emergency Contact Information Primary Emergency Contact: King,[name] J Address: [address]          [place], [place] [address] [place] Mobile Phone: [phone] Relation: Daughter Secondary Emergency Contact: Benedetto CoonsSimmons,Keeley  [place] Mobile Phone: [phone] Relation: Daughter   Allergies  Allergen Reactions  . Alprazolam Other (See Comments)    Family preference, for patient to not take med  . Percocet [Oxycodone-Acetaminophen] Other (See Comments)    Family preference, for patient to not take med  . Codeine Diarrhea and Nausea And Vomiting  . Doxycycline Diarrhea and Nausea And Vomiting  . Hydrocodone Nausea And Vomiting  . Omnicef [Cefdinir] Nausea Only and Other (See Comments)    Constipation, tolerated Zosyn  . Augmentin [Amoxicillin-Pot Clavulanate] Hives and Rash    Has patient had a PCN reaction causing immediate rash, facial/tongue/throat swelling, SOB or lightheadedness with hypotension: Yes Has patient had a PCN reaction causing severe rash involving mucus membranes or skin necrosis: Yes Did a PCN reaction that required hospitalization No Did PCN reaction occurring within the last 10 years: Yes If all of the above answers are "NO", then may proceed with Cephalosporin use.  Pt states she has taken penicillin since, and was ok with it...  . Ciprofloxacin Hives    Chief Complaint  Patient presents with  . New Admit To SNF    New Admission  Visit     HPI:  Patient is a 70 y.o. female seen today for short term rehabilitation post hospital admission from 10/16/16-11/01/16 post fall. She was found to have Cervical vertebrae fracture, AKI and c.diff colitis. Neurosurgery was consulted and cervical collar was advised. She was started on po vancomycin. She received iv fluids and this improved her renal function. She had hyperglycemia and was restarted on lantus. She then had hypoglycemia and required D5 infusion She has PMH of diabetes, hypothyroidism, CVA, ckd stage 3, anemia of chronic disease among others. She was in a SNF prior to this admission. She is seen in her room today.   Review of Systems:  Constitutional: Negative for fever, chills. Feels weak and tired. HENT: Negative for headache, congestion, nasal discharge. He has difficulty swallowing.   Eyes: Negative for double vision and discharge.  Respiratory: Negative for cough, shortness of breath and wheezing.   Cardiovascular: Negative for chest pain, palpitations, leg swelling.  Gastrointestinal: Negative for heartburn, nausea, vomiting, abdominal pain. Last bowel movement was yesterday with loose stool.  Genitourinary: Negative for dysuria.  Musculoskeletal: Negative for fall in the facility.  Skin: Negative for itching, rash.  Neurological: Negative for dizziness. Psychiatric/Behavioral: Negative for depression   Past Medical History:  Diagnosis Date  . Allergic rhinitis   . Anemia   . Anxiety   . Arthritis    "mostly feet, hands" (10/17/2016)  . Benign hypertension with CKD (chronic kidney disease) stage III   . Benign paroxysmal positional vertigo   . Bipolar disorder (HCC)   . Broken finger   .  Broken shoulder   . Broken toes   . Cervicalgia   . CHF (congestive heart failure) (HCC) 07/2016  . CKD (chronic kidney disease) stage 3, GFR 30-59 ml/min   . COPD (chronic obstructive pulmonary disease) (HCC)   . Depression   . Elevated liver enzymes Hep B/C neg  2014  . Fall at nursing home 10/15/2016  . GERD (gastroesophageal reflux disease)   . Glaucoma   . High cholesterol   . History of alcohol use   . History of blood transfusion 09/2016   "blood got really really low"  . Hypertension   . Hypothyroidism   . Interstitial cystitis    bladder stretched every 9 months  . Migraines    "pretty much qd" (10/17/2016)  . Psoriasis   . Stroke (HCC) 05/16/2016   Left occipital and thalamic, right hippocampal  . Thyroid disease   . Tobacco use   . 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 Vitamin B12 deficiency    Past Surgical History:  Procedure Laterality Date  . ANTERIOR CERVICAL DECOMP/DISCECTOMY FUSION  ~ 2009  . APPENDECTOMY    . BACK SURGERY    . BLADDER SUSPENSION    . CATARACT EXTRACTION W/ INTRAOCULAR LENS  IMPLANT, BILATERAL Bilateral   . DILATION AND CURETTAGE OF UTERUS    . HERNIA REPAIR    . LAPAROSCOPIC CHOLECYSTECTOMY    . TONSILLECTOMY    . TUBAL LIGATION    . VAGINAL HYSTERECTOMY     with oophorectomy   Social History:   reports that she quit smoking about 92 years ago. Her smoking use included Cigarettes. She has a 2.50 pack-year smoking history. She has never used smokeless tobacco. She reports that she does not drink alcohol or use drugs.  Family History  Problem Relation Age of Onset  . Alcohol abuse Mother   . Arthritis Mother   . Asthma Mother   . Cancer Mother     colon cancer  . Hypertension Mother   . Migraines Mother   . Stroke Mother   . Lung disease Mother   . COPD Mother   . Diabetes Father   . Hypertension Father   . Heart disease Father   . Heart attack Father   . Heart disease Paternal Grandmother   . Diabetes Paternal Grandmother   . Stroke Paternal Grandmother   . Cancer Paternal Grandmother   . Diabetes Paternal Grandfather     Medications:   Medication List       Accurate as of 10/24/16 10:58 AM. Always use your most recent med list.          acetaminophen  325 MG tablet Commonly known as:  TYLENOL Take 2 tablets (650 mg total) by mouth every 6 (six) hours as needed for mild pain (or Fever >/= 101).   amLODipine 5 MG tablet Commonly known as:  NORVASC Take 5 mg by mouth daily.   aspirin 81 MG chewable tablet Chew 1 tablet (81 mg total) by mouth daily.   atorvastatin 10 MG tablet Commonly known as:  LIPITOR Take 1 tablet (10 mg total) by mouth daily at 6 PM.   carvedilol 3.125 MG tablet Commonly known as:  COREG Take 1 tablet (3.125 mg total) by mouth 2 (two) times daily with a meal.   clopidogrel 75 MG tablet Commonly known as:  PLAVIX Take 75 mg by mouth daily.   escitalopram 20 MG tablet Commonly known as:  LEXAPRO Take 20 mg  by mouth at bedtime.   famotidine 20 MG tablet Commonly known as:  PEPCID Take 1 tablet (20 mg total) by mouth daily.   insulin aspart 100 UNIT/ML injection Commonly known as:  novoLOG Sliding Scale Coverage three times a day. No HS coverage. CBG < 70: implement hypoglycemia protocol CBG 70 - 120: 0 units CBG 121 - 150: 1 unit CBG 151 - 200: 2 units CBG 201 - 250: 3 units CBG 251 - 300: 5 units CBG 301 - 350: 7 units CBG 351 - 400: 9 units   insulin glargine 100 UNIT/ML injection Commonly known as:  LANTUS Inject 5 Units into the skin at bedtime.   iron polysaccharides 150 MG capsule Commonly known as:  NIFEREX Take 1 capsule (150 mg total) by mouth 2 (two) times daily.   meclizine 25 MG tablet Commonly known as:  ANTIVERT Take 1 tablet (25 mg total) by mouth 3 (three) times daily as needed for dizziness.   SYNTHROID 100 MCG tablet Generic drug:  levothyroxine Take 1 tablet (100 mcg total) by mouth daily before breakfast. Use only brand name medication.   traMADol 50 MG tablet Commonly known as:  ULTRAM Take 1 tablet (50 mg total) by mouth every 6 (six) hours as needed.   UNABLE TO FIND Med Name: Med pass 120 mL by mouth 3 times daily   vancomycin 50 mg/mL oral solution Commonly known  as:  VANCOCIN Take 2.5 mLs (125 mg total) by mouth 4 (four) times daily. For 11 more days       Immunizations: Immunization History  Administered Date(s) Administered  . Pneumococcal Polysaccharide-23 12/16/2008  . Td 12/16/2009     Physical Exam:  Vitals:   10/24/16 1048  BP: (!) 166/81  Pulse: 80  Temp: 99.3 F (37.4 C)  TempSrc: Oral  SpO2: 96%  Weight: 133 lb 9.6 oz (60.6 kg)  Height: 5\' 2"  (1.575 m)   Body mass index is 24.44 kg/m.  General- elderly female, well built, in no acute distress Head- normocephalic, atraumatic Nose- no maxillary or frontal sinus tenderness, no nasal discharge Throat- moist mucus membrane Eyes- PERRLA, EOMI, no pallor, no icterus, no discharge Neck- no cervical lymphadenopathy Cardiovascular- normal s1,s2, no murmur Respiratory- bilateral clear to auscultation, no wheeze, no rhonchi, no crackles, no use of accessory muscles Abdomen- bowel sounds present, soft, non tender, no guarding or rigidity, no CVA tenderness Musculoskeletal- able to move all 4 extremities, generalized weakness Neurological- alert and oriented to person and place but not to time Skin- warm and dry Psychiatry- normal mood and affect    Labs reviewed: Basic Metabolic Panel:  Recent Labs  16/10/96 0403  06/25/16 0308  09/20/16 1830  10/18/16 0316 10/19/16 0614 10/21/16 0552  NA 132*  < > 135  < > 136  < > 139 136 135  K 4.3  < > 3.7  < > 3.7  < > 3.6 4.0 4.6  CL 104  < > 112*  < > 107  < > 109 109 103  CO2 10*  < > 16*  < > 20*  < > 24 22 22   GLUCOSE 531*  < > 138*  < > 147*  < > 109* 126* 385*  BUN 38*  < > 33*  < > 58*  < > 14 14 12   CREATININE 2.55*  < > 2.13*  < > 3.23*  < > 1.13* 1.31* 1.41*  CALCIUM 7.2*  < > 7.1*  < > 8.6*  < >  7.3* 7.4* 7.7*  MG 1.8  --  1.6*  --  1.6*  --   --   --   --   PHOS 3.6  --  2.0*  --  4.0  --   --   --   --   < > = values in this interval not displayed. Liver Function Tests:  Recent Labs  10/09/16 0355  10/16/16 1055 10/18/16 0316  AST 37 33 18  ALT 81* 39 19  ALKPHOS 122 154* 85  BILITOT 0.7 0.7 0.1*  PROT 4.5* 6.0* 3.5*  ALBUMIN 1.8* 2.4* 1.4*    Recent Labs  05/10/16 0359  09/18/16 1224 09/20/16 1233 10/01/16 1454  LIPASE 21  < > 123* 32 42  AMYLASE 55  --   --   --   --   < > = values in this interval not displayed.  Recent Labs  05/06/16 1113  AMMONIA 61*   CBC:  Recent Labs  09/20/16 1233  09/28/16 1720  10/16/16 1055  10/18/16 0316 10/19/16 0614 10/21/16 0552  WBC 13.2*  < > 6.5  < > 15.2*  < > 8.7 7.9 9.0  NEUTROABS 12.2*  --  4.1  --  13.5*  --   --   --   --   HGB 9.8*  < > 9.2*  < > 13.8  < > 8.5* 8.7* 9.1*  HCT 33.2*  < > 27.2*  < > 40.6  < > 25.6* 26.2* 27.8*  MCV 102.5*  < > 90.7  < > 89.4  < > 90.5 90.3 89.7  PLT 357  < > 260  < > 340  < > 258 249 298  < > = values in this interval not displayed. Cardiac Enzymes:  Recent Labs  09/26/16 1652 09/28/16 1720 10/02/16 1446  TROPONINI <0.03 <0.03 <0.03   BNP: Invalid input(s): POCBNP CBG:  Recent Labs  10/22/16 2126 10/23/16 0821 10/23/16 1219  GLUCAP 119* 200* 152*    Radiological Exams: Dg Chest 2 View  Result Date: 10/16/2016 CLINICAL DATA:  Chest pain, diarrhea for 1 week EXAM: CHEST  2 VIEW COMPARISON:  10/05/2016 FINDINGS: Cardiomediastinal silhouette is stable. There is small bilateral pleural effusion. Bilateral basilar atelectasis or infiltrate. Small fluid is noted in right fissure. No pulmonary edema. Mild degenerative changes thoracic spine. IMPRESSION: Small bilateral pleural effusion. Bilateral basilar atelectasis or infiltrate. No pulmonary edema. Electronically Signed   By: Natasha Mead M.D.   On: 10/16/2016 11:38   Dg Chest 2 View  Result Date: 09/28/2016 CLINICAL DATA:  Acute onset of bilateral leg swelling and dizziness. Initial encounter. EXAM: CHEST  2 VIEW COMPARISON:  Chest radiograph performed 09/26/2016 FINDINGS: The lungs are well-aerated and clear. There is no  evidence of focal opacification, pleural effusion or pneumothorax. The heart is normal in size; the mediastinal contour is within normal limits. No acute osseous abnormalities are seen. IMPRESSION: No acute cardiopulmonary process seen. Electronically Signed   By: Roanna Raider M.D.   On: 09/28/2016 18:21   Dg Chest 2 View  Result Date: 09/26/2016 CLINICAL DATA:  Chest pain with headache. Radiates into the right arm. EXAM: CHEST  2 VIEW COMPARISON:  09/20/2016 FINDINGS: There is no focal parenchymal opacity. There is a small right pleural effusion. The heart and mediastinal contours are unremarkable. The osseous structures are unremarkable. IMPRESSION: Small right pleural effusion. Electronically Signed   By: Elige Ko   On: 09/26/2016 17:33   Ct Head Wo Contrast  Addendum Date: 10/16/2016   ADDENDUM REPORT: 10/16/2016 12:37 ADDENDUM: The original report was by Dr. Gaylyn Rong. The following addendum is by Dr. Gaylyn Rong: These results were called by telephone at the time of interpretation on 10/16/2016 at 12:31 pm to Dr. Crista Curb , who verbally acknowledged these results. Electronically Signed   By: Gaylyn Rong M.D.   On: 10/16/2016 12:37   Result Date: 10/16/2016 CLINICAL DATA:  Fall this past Sunday. Posterior headache. Progressive weakness. Diabetes. EXAM: CT HEAD WITHOUT CONTRAST CT CERVICAL SPINE WITHOUT CONTRAST TECHNIQUE: Multidetector CT imaging of the head and cervical spine was performed following the standard protocol without intravenous contrast. Multiplanar CT image reconstructions of the cervical spine were also generated. COMPARISON:  09/06/2016 FINDINGS: CT HEAD FINDINGS Brain: Remote 4 mm lacunar infarct in the left cerebral peduncle extending into the left thalamus. Periventricular white matter and corona radiata hypodensities favor chronic ischemic microvascular white matter disease. Prior graft otherwise, The brainstem, cerebellum, cerebral peduncles, thalami,  basal ganglia, basilar cisterns, and ventricular system appear within normal limits. No intracranial hemorrhage, mass lesion, or acute CVA. Vascular: There is atherosclerotic calcification of the cavernous carotid arteries bilaterally. Skull: Unremarkable Sinuses/Orbits: Mild chronic ethmoid, maxillary, and sphenoid sinusitis. Hypoplastic frontal sinuses. Other: Right posterior parietal scalp soft tissue swelling. CT CERVICAL SPINE FINDINGS Alignment: 2.5 mm degenerative anterolisthesis at C4-5, not changed from prior. Skull base and vertebrae: Fracture through the base of the anterior osteophyte at the left anterior C6 vertebral level as on image 55/12. Interval coronal vertically oriented fracture through the anterior portion of the C7 vertebral body as shown on images 25 through 36 series 11, separating the anterior vertebral body and bridging anterior osteophytes from the posterior vertebral body. At T1, there is a fracture extending through the anterior osteophytes along the anterior cortical margin of the vertebral, with some cortical involvement for example on image 33/11. On the prior exam these osteophytes were essentially incorporated into the anterior vertebral body. Fracture through the base of the anterior osteophyte at T2 along the superior endplate. No posterior element involvement identified. ACDF at C5-6. Soft tissues and spinal canal: Mild stranding along the left lower carotid space likely related to the fractures. Disc levels: At C2-3 there is a posterior osteophyte causing mild central narrowing of the thecal sac. Uncinate and facet spurring cause right foraminal impingement at C3-4 and to a lesser degree at C4-5 and C6-7 ; and left foraminal stenosis at C2-3. Probable disc bulge at the C6-7 level. Upper chest: Bilateral pleural effusions. Other: Old deformity of the right medial clavicle. IMPRESSION: 1. New acute anterior fractures at C6, C7, T1, and T2. At the C7 level the fracture represents  a coronally oriented fracture through the anterior vertebral body. At the other levels, the fracture represents vertically oriented coronal fractures extending through the anterior bridging osteophytes. There is adjacent soft tissue edema more notably tracking in the left lower carotid space. No acute subluxation. No posterior element involvement. 2. Chronic 2.5 mm of degenerative anterolisthesis at C4-5. ACDF at C5-6. 3. Old fracture of the right medial clavicle, healed, with secondary degenerative right sternoclavicular arthropathy. 4. Bilateral pleural effusions. 5. Cervical spondylosis and degenerative disc disease causing impingement at C2- 3, C3-4, C4-5, and C6-7. 6. Intracranially, there is evidence of chronic microvascular white matter disease and a remote lacunar infarct involving the left thalamus and left cerebral peduncle, but no acute intracranial findings. 7. Chronic paranasal sinusitis. 8. Right posterior parietal scalp soft tissue swelling. Radiology assistant personnel  have been notified to put me in telephone contact with the referring physician or the referring physician's clinical representative in order to discuss these findings. Once this communication is established I will issue an addendum to this report for documentation purposes. Electronically Signed: By: Gaylyn Rong M.D. On: 10/16/2016 12:29   Ct Cervical Spine Wo Contrast  Addendum Date: 10/16/2016   ADDENDUM REPORT: 10/16/2016 12:37 ADDENDUM: The original report was by Dr. Gaylyn Rong. The following addendum is by Dr. Gaylyn Rong: These results were called by telephone at the time of interpretation on 10/16/2016 at 12:31 pm to Dr. Crista Curb , who verbally acknowledged these results. Electronically Signed   By: Gaylyn Rong M.D.   On: 10/16/2016 12:37   Result Date: 10/16/2016 CLINICAL DATA:  Fall this past Sunday. Posterior headache. Progressive weakness. Diabetes. EXAM: CT HEAD WITHOUT CONTRAST CT CERVICAL  SPINE WITHOUT CONTRAST TECHNIQUE: Multidetector CT imaging of the head and cervical spine was performed following the standard protocol without intravenous contrast. Multiplanar CT image reconstructions of the cervical spine were also generated. COMPARISON:  09/06/2016 FINDINGS: CT HEAD FINDINGS Brain: Remote 4 mm lacunar infarct in the left cerebral peduncle extending into the left thalamus. Periventricular white matter and corona radiata hypodensities favor chronic ischemic microvascular white matter disease. Prior graft otherwise, The brainstem, cerebellum, cerebral peduncles, thalami, basal ganglia, basilar cisterns, and ventricular system appear within normal limits. No intracranial hemorrhage, mass lesion, or acute CVA. Vascular: There is atherosclerotic calcification of the cavernous carotid arteries bilaterally. Skull: Unremarkable Sinuses/Orbits: Mild chronic ethmoid, maxillary, and sphenoid sinusitis. Hypoplastic frontal sinuses. Other: Right posterior parietal scalp soft tissue swelling. CT CERVICAL SPINE FINDINGS Alignment: 2.5 mm degenerative anterolisthesis at C4-5, not changed from prior. Skull base and vertebrae: Fracture through the base of the anterior osteophyte at the left anterior C6 vertebral level as on image 55/12. Interval coronal vertically oriented fracture through the anterior portion of the C7 vertebral body as shown on images 25 through 36 series 11, separating the anterior vertebral body and bridging anterior osteophytes from the posterior vertebral body. At T1, there is a fracture extending through the anterior osteophytes along the anterior cortical margin of the vertebral, with some cortical involvement for example on image 33/11. On the prior exam these osteophytes were essentially incorporated into the anterior vertebral body. Fracture through the base of the anterior osteophyte at T2 along the superior endplate. No posterior element involvement identified. ACDF at C5-6. Soft  tissues and spinal canal: Mild stranding along the left lower carotid space likely related to the fractures. Disc levels: At C2-3 there is a posterior osteophyte causing mild central narrowing of the thecal sac. Uncinate and facet spurring cause right foraminal impingement at C3-4 and to a lesser degree at C4-5 and C6-7 ; and left foraminal stenosis at C2-3. Probable disc bulge at the C6-7 level. Upper chest: Bilateral pleural effusions. Other: Old deformity of the right medial clavicle. IMPRESSION: 1. New acute anterior fractures at C6, C7, T1, and T2. At the C7 level the fracture represents a coronally oriented fracture through the anterior vertebral body. At the other levels, the fracture represents vertically oriented coronal fractures extending through the anterior bridging osteophytes. There is adjacent soft tissue edema more notably tracking in the left lower carotid space. No acute subluxation. No posterior element involvement. 2. Chronic 2.5 mm of degenerative anterolisthesis at C4-5. ACDF at C5-6. 3. Old fracture of the right medial clavicle, healed, with secondary degenerative right sternoclavicular arthropathy. 4. Bilateral pleural effusions. 5.  Cervical spondylosis and degenerative disc disease causing impingement at C2- 3, C3-4, C4-5, and C6-7. 6. Intracranially, there is evidence of chronic microvascular white matter disease and a remote lacunar infarct involving the left thalamus and left cerebral peduncle, but no acute intracranial findings. 7. Chronic paranasal sinusitis. 8. Right posterior parietal scalp soft tissue swelling. Radiology assistant personnel have been notified to put me in telephone contact with the referring physician or the referring physician's clinical representative in order to discuss these findings. Once this communication is established I will issue an addendum to this report for documentation purposes. Electronically Signed: By: Gaylyn Rong M.D. On: 10/16/2016 12:29     Dg Esophagus  Result Date: 10/04/2016 CLINICAL DATA:  70 year old female inpatient with several months of dysphagia, worsened since recent stroke. EXAM: ESOPHOGRAM/BARIUM SWALLOW TECHNIQUE: Single contrast examination was performed using  thin barium. FLUOROSCOPY TIME:  Fluoroscopy Time:  3 minutes 36 seconds Radiation Exposure Index (if provided by the fluoroscopic device): 46.6 Number of Acquired Spot Images: 3 COMPARISON:  10/02/2016 chest radiograph. FINDINGS: Limited inpatient esophagram, significantly limited by patient mobility. The oral and pharyngeal phases of swallowing appear normal, with no laryngeal penetration or tracheobronchial aspiration. There is no significant barium retention in the pharynx. No mass, stricture or diverticulum is seen in the pharynx. There is cricopharyngeus muscle dysfunction, characterized by delayed and incomplete opening of the cricopharyngeus muscle. There is moderate esophageal dysmotility, characterized by intermittent moderate weakening of primary peristalsis in the mid to lower thoracic esophagus. No hiatal hernia. No gastroesophageal reflux was elicited on this limited scan. No gross esophageal mass or ulcer is detected. No gross esophageal stricture is visualized on these limited views. A swallowed 13 mm barium tablet became lodged at the esophagogastric junction for 1-2 minutes until additional barium was swallowed, with subsequent passage of the tablet into the stomach. The stomach is relatively collapsed. IMPRESSION: 1. Normal oral and pharyngeal phase of swallowing, with no laryngeal penetration or tracheobronchial aspiration. 2. Cricopharyngeus muscle dysfunction, characteristic of chronic gastroesophageal reflux disease. 3. Moderate esophageal dysmotility, with a pattern characteristic of chronic reflux related dysmotility. 4. Gastroesophageal reflux was not directly elicited on this limited scan. No hiatal hernia. 5. Swallowed 13 mm barium tablet became  lodged temporarily at the esophagogastric junction, cannot exclude a mild peptic stricture at this location. No gross esophageal mass. Consider correlation with upper endoscopy as clinically warranted. Electronically Signed   By: Delbert Phenix M.D.   On: 10/04/2016 11:19   Mr Abdomen Mrcp Wo Contrast  Result Date: 10/18/2016 CLINICAL DATA:  Status post cholecystectomy with dilated CBD EXAM: MRI ABDOMEN WITHOUT CONTRAST  (INCLUDING MRCP) TECHNIQUE: Multiplanar multisequence MR imaging of the abdomen was performed. Heavily T2-weighted images of the biliary and pancreatic ducts were obtained, and three-dimensional MRCP images were rendered by post processing. COMPARISON:  Right upper ultrasound dated 10/08/2016. CT abdomen pelvis dated 09/18/2016. FINDINGS: Motion degraded images. Lower chest: Lung bases are clear. Hepatobiliary: Liver is poorly evaluated due to motion degradation and artifact. No focal hepatic lesion is seen. Status post cholecystectomy. No intrahepatic duct dilatation. Dilated CBD, measuring 11 mm (series 5/ image 18), and smoothly tapering at the ampulla. No choledocholithiasis is seen. Pancreas:  Motion degraded but grossly unremarkable. Spleen:  Within normal limits. Adrenals/Urinary Tract:  Adrenal glands are grossly unremarkable. Kidneys are grossly unremarkable.  No hydronephrosis. Stomach/Bowel: Stomach is within normal limits. Visualized bowel is unremarkable. Vascular/Lymphatic:  No evidence of abdominal aortic aneurysm. No suspicious abdominal lymphadenopathy. Other:  Moderate abdominal  ascites. Musculoskeletal: No focal osseous lesion. IMPRESSION: Status post cholecystectomy. Common duct measures 11 mm and smoothly tapers at the ampulla, likely postsurgical. No choledocholithiasis is seen. Electronically Signed   By: Charline Bills M.D.   On: 10/18/2016 10:09   Dg Chest Port 1 View  Result Date: 10/05/2016 CLINICAL DATA:  Fever EXAM: PORTABLE CHEST 1 VIEW COMPARISON:  10/02/2016  FINDINGS: Cardiomediastinal silhouette is stable. There is trace left pleural effusion left basilar atelectasis or infiltrate. No pulmonary edema. IMPRESSION: Trace left pleural effusion with left basilar atelectasis or infiltrate. No pulmonary edema. Electronically Signed   By: Natasha Mead M.D.   On: 10/05/2016 20:13   Dg Chest Port 1 View  Result Date: 10/02/2016 CLINICAL DATA:  Altered mental status. Chest pain and shortness of breath. Congestive heart failure, chronic kidney disease, and asthma. EXAM: PORTABLE CHEST 1 VIEW COMPARISON:  09/28/2016 FINDINGS: The heart size and mediastinal contours are within normal limits. Both lungs are clear. No evidence of pulmonary infiltrate, edema, or pleural effusion. Cervical spine fusion hardware again noted. IMPRESSION: No active disease. Electronically Signed   By: Myles Rosenthal M.D.   On: 10/02/2016 14:07   Dg Abd Portable 2v  Result Date: 10/05/2016 CLINICAL DATA:  Inpatient.  Fever.  Vomiting. EXAM: PORTABLE ABDOMEN - 2 VIEW COMPARISON:  09/18/2016 CT abdomen/ pelvis. FINDINGS: No dilated small bowel loops. Retained barium is seen throughout the nondilated colon. No evidence of pneumatosis or pneumoperitoneum. Cholecystectomy clips are seen in the right upper quadrant of the abdomen. Mild thoracolumbar spondylosis. IMPRESSION: Nonobstructive bowel gas pattern. Retained barium throughout the nondilated colon. Electronically Signed   By: Delbert Phenix M.D.   On: 10/05/2016 20:11   US Abdomen Limited Ruq  Result Date: 10/08/2016 CLINICAL DATA:  Elevated LFTs. EXAM: US ABDOMEN LIMITED - RIGHT UPPER QUADRANT COMPARISON:  CT 09/18/2016. FINDINGS: Gallbladder: Cholecystectomy. Common bile duct: Diameter: 10.4 mm. Non shadowing echogenic material noted in the common bile duct.Intrahepatic biliary ductal dilatation is noted. Liver: No focal hepatic abnormality identified. Liver echogenicity is normal. Ascites and bilateral pleural effusions noted. IMPRESSION: 1.  Cholecystectomy. 2. The common bile duct is dilated to 10.4 mm. Non shadowing echogenic material is noted within the common bile duct . Intrahepatic biliary ductal dilatation is noted. Further evaluation with MRCP can be obtained as needed. 3.  Mild ascites noted.  Bilateral pleural effusions noted. Electronically Signed   By: Maisie Fus  Register   On: 10/08/2016 11:28    Assessment/Plan  Physical deconditioning Will have her work with physical therapy and occupational therapy team to help with gait training and muscle strengthening exercises.fall precautions. Skin care. Encourage to be out of bed. Will get palliative care consult to help review goals of care.   Cervical fracture Has cervical collar in place for fracture from c6-T1. Continue tylenol 650 mg q6h prn pain and tramadol 50 mg q6h prn pain. Will have patient work with PT/OT as tolerated to regain strength and restore function.  Fall precautions are in place.  C.diff diarrhea Has loose stool. Continue to provide contact precautions. Continue vancomycin 125 mg qid  ckd stage 3 Monitor BMP  Dm type 2 Continue lantus 3 u daily and  SSI novolog. Monitor cbg.  Lab Results  Component Value Date   HGBA1C 9.2 (H) 09/20/2016    Dysphagia Oropharyngeal cause ruled out by barium swallow. Will have SLP to evaluate. Has history of CVA.   Cognitive impairment Lab Results  Component Value Date   TSH 135.864 (H) 10/16/2016  Reviewed tsh. Continue her levothyroxine. Monitor clinically for now and to provide supportive care. SLP consult. Once her tsh has normalized, consider MMSE  Anemia of chronic disease Monitor cbc. Continue niferex  Protein calorie malnutrition Get RD consult, monitor po intake and weight. Continue protein supplement  gerd Continue famotidine 20 mg daily. Noted in barium swallow  Hypothyroidism Lab Results  Component Value Date   TSH 135.864 (H) 10/16/2016    Continue levothyroxine 100 mcg  daily  hHTN Monitor bp, continue norvasc and coreg and baby aspirin  HLD Continue lipitor  History of CVA Continue aspirin and plavix with her statin, monitor bp  Chronic depression Continue escitalopram  Chronic diastolic chf Continue her b blocker. Her ARB was held with impaired renal function.     Goals of care: short term rehabilitation   Labs/tests ordered: cbc, cmp 10/28/16  Family/ staff Communication: reviewed care plan with patient and nursing supervisor    Oneal Grout, MD Internal Medicine [facility] [address] [place], [place] [address] Cell Phone (Monday-Friday 8 am - 5 pm): [phone] On Call: [phone] and follow prompts after 5 pm and on weekends Office Phone: [phone] Office Fax: [phone]

## 2016-10-28 ENCOUNTER — Non-Acute Institutional Stay (SKILLED_NURSING_FACILITY): Payer: Medicare Other | Admitting: Internal Medicine

## 2016-10-28 ENCOUNTER — Encounter: Payer: Self-pay | Admitting: Internal Medicine

## 2016-10-28 DIAGNOSIS — F01518 Vascular dementia, unspecified severity, with other behavioral disturbance: Secondary | ICD-10-CM

## 2016-10-28 DIAGNOSIS — F23 Brief psychotic disorder: Secondary | ICD-10-CM | POA: Diagnosis not present

## 2016-10-28 DIAGNOSIS — F0151 Vascular dementia with behavioral disturbance: Secondary | ICD-10-CM

## 2016-10-28 DIAGNOSIS — R10819 Abdominal tenderness, unspecified site: Secondary | ICD-10-CM

## 2016-10-28 NOTE — Progress Notes (Signed)
LOCATION: [facility]  PCP: Irving Copas, MD   Code Status: DNR  Goals of care: Advanced Directive information Advanced Directives 12/05/2016  Does Patient Have a Medical Advance Directive? Yes  Type of Advance Directive Out of facility DNR (pink MOST or yellow form)  Does patient want to make changes to medical advance directive? No - Patient declined  Copy of Healthcare Power of Attorney in Chart? -  Would patient like information on creating a medical advance directive? -       Extended Emergency Contact Information Primary Emergency Contact: King,[name] J Address: [address]          [place], [place] [address] [place] Phone: [phone] Relation: Daughter Secondary Emergency Contact: Benedetto Co[place] Mobile Phone: [phone] Relation: Daughter   Allergies  Allergen Reactions  . Alprazolam Other (See Comments)    Family preference, for patient to not take med  . Percocet [Oxycodone-Acetaminophen] Other (See Comments)    Family preference, for patient to not take med  . Codeine Diarrhea and Nausea And Vomiting  . Doxycycline Diarrhea and Nausea And Vomiting  . Hydrocodone Nausea And Vomiting  . Omnicef [Cefdinir] Nausea Only and Other (See Comments)    Constipation, tolerated Zosyn  . Augmentin [Amoxicillin-Pot Clavulanate] Hives and Rash    Has patient had a PCN reaction causing immediate rash, facial/tongue/throat swelling, SOB or lightheadedness with hypotension: Yes Has patient had a PCN reaction causing severe rash involving mucus membranes or skin necrosis: Yes Did a PCN reaction that required hospitalization No Did PCN reaction occurring within the last 10 years: Yes If all of the above answers are "NO", then may proceed with Cephalosporin use.  Pt states she has taken penicillin since, and was ok with it...  . Ciprofloxacin Hives    Chief Complaint  Patient presents with  . Acute Visit      Frequent falls and hallucinations      HPI:  Patient is a 70 y.o. female seen today for acute visit. she has been having frequent falls. her family has noticed her to be hallucinating. There is increased confusion with agitation at night. She is seen in her room today with her daughter present.   Review of Systems:  Constitutional: Negative for fever. Feels weak and tired. HENT: Negative for headache Eyes: Negative for double vision and discharge.  Respiratory: Negative for cough, shortness of breath and wheezing.   Cardiovascular: Negative for chest pain, palpitation.  Gastrointestinal: Negative for heartburn, nausea, vomiting. Has abdominal discomfort.  Genitourinary: Negative for dysuria.  Musculoskeletal: Negative for fall in the facility.  Skin: Negative for itching, rash.  Neurological: Negative for dizziness.    Past Medical History:  Diagnosis Date  . Allergic rhinitis   . Anemia   . Anxiety   . Arthritis    "mostly feet, hands" (10/17/2016)  . Benign hypertension with CKD (chronic kidney disease) stage III   . Benign paroxysmal positional vertigo   . Bipolar disorder (HCC)   . Broken finger   . Broken shoulder   . Broken toes   . Cervicalgia   . CHF (congestive heart failure) (HCC) 07/2016  . CKD (chronic kidney disease) stage 3, GFR 30-59 ml/min   . COPD (chronic obstructive pulmonary disease) (HCC)   . Depression   . Elevated liver enzymes Hep B/C neg 2014  . Fall at nursing home 10/15/2016  . GERD (gastroesophageal reflux disease)   . Glaucoma   . High  cholesterol   . History of alcohol use   . History of blood transfusion 09/2016   "blood got really really low"  . Hypertension   . Hypothyroidism   . Interstitial cystitis    bladder stretched every 9 months  . Migraines    "pretty much qd" (10/17/2016)  . Psoriasis   . Stroke (HCC) 05/16/2016   Left occipital and thalamic, right hippocampal  . Thyroid disease   . Tobacco use   . 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 Vitamin B12 deficiency    Past Surgical History:  Procedure Laterality Date  . ANTERIOR CERVICAL DECOMP/DISCECTOMY FUSION  ~ 2009  . APPENDECTOMY    . BACK SURGERY    . BLADDER SUSPENSION    . CATARACT EXTRACTION W/ INTRAOCULAR LENS  IMPLANT, BILATERAL Bilateral   . DILATION AND CURETTAGE OF UTERUS    . HERNIA REPAIR    . LAPAROSCOPIC CHOLECYSTECTOMY    . TONSILLECTOMY    . TUBAL LIGATION    . VAGINAL HYSTERECTOMY     with oophorectomy   Social History:   reports that she quit smoking about 93 years ago. Her smoking use included Cigarettes. She has a 2.50 pack-year smoking history. She has never used smokeless tobacco. She reports that she does not drink alcohol or use drugs.  Family History  Problem Relation Age of Onset  . Alcohol abuse Mother   . Arthritis Mother   . Asthma Mother   . Cancer Mother     colon cancer  . Hypertension Mother   . Migraines Mother   . Stroke Mother   . Lung disease Mother   . COPD Mother   . Diabetes Father   . Hypertension Father   . Heart disease Father   . Heart attack Father   . Heart disease Paternal Grandmother   . Diabetes Paternal Grandmother   . Stroke Paternal Grandmother   . Cancer Paternal Grandmother   . Diabetes Paternal Grandfather     Medications: Allergies as of 10/28/2016      Reactions   Alprazolam Other (See Comments)   Family preference, for patient to not take med   Percocet [oxycodone-acetaminophen] Other (See Comments)   Family preference, for patient to not take med   Codeine Diarrhea, Nausea And Vomiting   Doxycycline Diarrhea, Nausea And Vomiting   Hydrocodone Nausea And Vomiting   Omnicef [cefdinir] Nausea Only, Other (See Comments)   Constipation, tolerated Zosyn   Augmentin [amoxicillin-pot Clavulanate] Hives, Rash   Has patient had a PCN reaction causing immediate rash, facial/tongue/throat swelling, SOB or lightheadedness with hypotension: Yes Has  patient had a PCN reaction causing severe rash involving mucus membranes or skin necrosis: Yes Did a PCN reaction that required hospitalization No Did PCN reaction occurring within the last 10 years: Yes If all of the above answers are "NO", then may proceed with Cephalosporin use. Pt states she has taken penicillin since, and was ok with it...   Ciprofloxacin Hives      Medication List       Accurate as of 10/28/16 11:59 PM. Always use your most recent med list.          acetaminophen 325 MG tablet Commonly known as:  TYLENOL Take 650 mg by mouth every 6 (six) hours as needed for mild pain or fever.   amLODipine 5 MG tablet Commonly known as:  NORVASC Take 5 mg by mouth daily.   aspirin 81 MG chewable  tablet Chew 1 tablet (81 mg total) by mouth daily.   atorvastatin 10 MG tablet Commonly known as:  LIPITOR Take 1 tablet (10 mg total) by mouth daily at 6 PM.   carvedilol 3.125 MG tablet Commonly known as:  COREG Take 1 tablet (3.125 mg total) by mouth 2 (two) times daily with a meal.   cephALEXin 500 MG capsule Commonly known as:  KEFLEX Take 500 mg by mouth 2 (two) times daily.   clopidogrel 75 MG tablet Commonly known as:  PLAVIX Take 75 mg by mouth daily.   escitalopram 20 MG tablet Commonly known as:  LEXAPRO Take 20 mg by mouth at bedtime.   famotidine 20 MG tablet Commonly known as:  PEPCID Take 1 tablet (20 mg total) by mouth daily.   feeding supplement (PRO-STAT SUGAR FREE 64) Liqd Take 30 mLs by mouth 2 (two) times daily.   insulin aspart 100 UNIT/ML injection Commonly known as:  novoLOG Sliding Scale Coverage three times a day. No HS coverage. CBG < 70: implement hypoglycemia protocol CBG 70 - 120: 0 units CBG 121 - 150: 1 unit CBG 151 - 200: 2 units CBG 201 - 250: 3 units CBG 251 - 300: 5 units CBG 301 - 350: 7 units CBG 351 - 400: 9 units   insulin glargine 100 UNIT/ML injection Commonly known as:  LANTUS Inject 3 Units into the skin at  bedtime.   insulin lispro 100 UNIT/ML injection Commonly known as:  HUMALOG Inject 0.01-0.05 mLs (1-5 Units total) into the skin 3 (three) times daily before meals. For CBGs < 150 = 0 units; 151 - 200 = 1 units, 201 - 250 = 2 units, 251 - 300 = 3 units; 301-350 = 4 units; 351-400 = 5 units; > 400 notify the provider.   iron polysaccharides 150 MG capsule Commonly known as:  NIFEREX Take 1 capsule (150 mg total) by mouth 2 (two) times daily.   meclizine 25 MG tablet Commonly known as:  ANTIVERT Take 1 tablet (25 mg total) by mouth 3 (three) times daily as needed for dizziness.   NUTRITIONAL SUPPLEMENT Liqd Give Med Pass 2.0  Sugar free 120 mls by mouth three times daily between meals as a feeding supplement.   nystatin cream Commonly known as:  MYCOSTATIN Apply 1 application topically 2 (two) times daily. Under bilateral breast. Stop date 11/05/16   nystatin-triamcinolone ointment Commonly known as:  MYCOLOG Apply ointment every shift to perianal / buttocks for rash   ondansetron 4 MG disintegrating tablet Commonly known as:  ZOFRAN ODT Take 1 tablet (4 mg total) by mouth every 8 (eight) hours as needed for nausea or vomiting.   levothyroxine 112 MCG tablet Commonly known as:  SYNTHROID, LEVOTHROID Take 112 mcg by mouth daily before breakfast.   SYNTHROID 100 MCG tablet Generic drug:  levothyroxine Take 1 tablet (100 mcg total) by mouth daily before breakfast. Use only brand name medication.   traMADol 50 MG tablet Commonly known as:  ULTRAM Take 1 tablet (50 mg total) by mouth every 6 (six) hours as needed.   traMADol 50 MG tablet Commonly known as:  ULTRAM Take 1 tablet (50 mg total) by mouth every 6 (six) hours as needed.   UNABLE TO FIND Med Name: Med pass 120 mL by mouth 3 times daily   vancomycin 50 mg/mL oral solution Commonly known as:  VANCOCIN Take 2.5 mLs (125 mg total) by mouth 4 (four) times daily. For 11 more days  Immunizations: Immunization  History  Administered Date(s) Administered  . Pneumococcal Polysaccharide-23 12/16/2008  . Td 12/16/2009     Physical Exam:  Vitals:   10/28/16 1002  BP: (!) 170/90  Pulse: 81  Resp: 16  Temp: (!) 96.8 F (36 C)  TempSrc: Oral  SpO2: 97%  Weight: 133 lb 9.6 oz (60.6 kg)  Height: 5\' 2"  (1.575 m)   Body mass index is 24.44 kg/m.  General- elderly female, well built, in no acute distress Head- normocephalic, atraumatic Nose- no nasal discharge Throat- moist mucus membrane Eyes- PERRLA, EOMI, no pallor, no icterus, no discharge Neck- no cervical lymphadenopathy Cardiovascular- normal s1,s2, no murmur Respiratory- bilateral clear to auscultation, no wheeze, no rhonchi, no crackles, no use of accessory muscles Abdomen- bowel sounds present, soft, mild distension and suprapubic tenderness +, no guarding or rigidity, no CVA tenderness Musculoskeletal- able to move all 4 extremities, generalized weakness Neurological- alert and oriented to person and place but not to time Skin- warm and dry Psychiatry- normal mood and affect at present    Labs reviewed: Basic Metabolic Panel:  Recent Labs  13/24/4006/10/01 0403  06/25/16 0308  09/20/16 1830  10/21/16 0552 11/04/16 12/02/16 2004 12/03/16 0736  NA 132*  < > 135  < > 136  < > 135 141 139 134*  K 4.3  < > 3.7  < > 3.7  < > 4.6 4.4 3.6 3.6  CL 104  < > 112*  < > 107  < > 103  --  109 102  CO2 10*  < > 16*  < > 20*  < > 22  --  24 22  GLUCOSE 531*  < > 138*  < > 147*  < > 385*  --  56* 343*  BUN 38*  < > 33*  < > 58*  < > 12 26* 33* 31*  CREATININE 2.55*  < > 2.13*  < > 3.23*  < > 1.41* 1.4* 1.82* 1.65*  CALCIUM 7.2*  < > 7.1*  < > 8.6*  < > 7.7*  --  7.9* 7.9*  MG 1.8  --  1.6*  --  1.6*  --   --   --   --   --   PHOS 3.6  --  2.0*  --  4.0  --   --   --   --   --   < > = values in this interval not displayed. Liver Function Tests:  Recent Labs  10/16/16 1055 10/18/16 0316 12/02/16 2004  AST 33 18 24  ALT 39 19 15   ALKPHOS 154* 85 82  BILITOT 0.7 0.1* 0.6  PROT 6.0* 3.5* 4.6*  ALBUMIN 2.4* 1.4* 1.8*    Recent Labs  05/10/16 0359  09/20/16 1233 10/01/16 1454 12/02/16 2004  LIPASE 21  < > 32 42 18  AMYLASE 55  --   --   --   --   < > = values in this interval not displayed.  Recent Labs  05/06/16 1113  AMMONIA 61*   CBC:  Recent Labs  09/28/16 1720  10/16/16 1055  10/21/16 0552 12/02/16 2004 12/03/16 0736  WBC 6.5  < > 15.2*  < > 9.0 12.3* 12.0*  NEUTROABS 4.1  --  13.5*  --   --  8.7*  --   HGB 9.2*  < > 13.8  < > 9.1* 8.9* 10.6*  HCT 27.2*  < > 40.6  < > 27.8* 26.5* 31.4*  MCV  90.7  < > 89.4  < > 89.7 91.8 91.7  PLT 260  < > 340  < > 298 290 346  < > = values in this interval not displayed.    Assessment/Plan  Acute psychosis Reviewed recent with no signs of infection. Given her suprapubic tenderness, send urine sample to rule out urinary tract infection. Start Seroquel 12.5 mg daily at bedtime and monitor  suprapubic tenderness Send urine sample for UA with culture and sensitivity. Hydration) hygiene to be maintained for now. Monitor clinically. She has been voiding.   Vascular dementia with behavioral disturbance With progressive decline. Will have palliative care to see the patient to review further goals of care with patient and her family. Start Seroquel 12.5 mg daily at bedtime as above to see if this will help with agitation. Send urine sample for study to rule out infection.   Labs/tests ordered: u/a with c/s  Family/ staff Communication: reviewed care plan with patient, her daughter and nursing supervisor    Oneal Grout, MD Internal Medicine [facility] [address] [place], [place] [address] Cell Phone (Monday-Friday 8 am - 5 pm): [phone] On Call: [phone] and follow prompts after 5 pm and on weekends Office Phone: [phone] Office Fax: [phone]

## 2016-11-01 ENCOUNTER — Non-Acute Institutional Stay (SKILLED_NURSING_FACILITY): Payer: Medicare Other | Admitting: Family

## 2016-11-01 DIAGNOSIS — Z794 Long term (current) use of insulin: Secondary | ICD-10-CM

## 2016-11-01 DIAGNOSIS — E1165 Type 2 diabetes mellitus with hyperglycemia: Secondary | ICD-10-CM

## 2016-11-01 DIAGNOSIS — A0472 Enterocolitis due to Clostridium difficile, not specified as recurrent: Secondary | ICD-10-CM

## 2016-11-01 DIAGNOSIS — IMO0002 Reserved for concepts with insufficient information to code with codable children: Secondary | ICD-10-CM

## 2016-11-01 NOTE — Progress Notes (Signed)
Location:  [facility] Room Number: [phone]  Place of Service:  SNF (31) Provider: Noele Icenhour FNP-C   Irving Copas, MD  Patient Care Team: Henrine Screws, MD as PCP - General (Family Medicine) Raj Janus, MD as Consulting Physician (Endocrinology) Janalyn Harder, MD as Consulting Physician (Dermatology) Chilton Si, MD as Attending Physician (Cardiology)  Extended Emergency Contact Information Primary Emergency Contact: King,[name] J Address: [address]          [place], [place] [address] [place] Phone: [phone] Relation: Daughter Secondary Emergency Contact: Benedetto Coons [place] Mobile Phone: [phone] Relation: Daughter  Code Status:  DNR  Goals of care: Advanced Directive information Advanced Directives 10/24/2016  Does patient have an advance directive? Yes  Type of Advance Directive Out of facility DNR (pink MOST or yellow form)  Does patient want to make changes to advanced directive? No - Patient declined  Copy of advanced directive(s) in chart? Yes  Would patient like information on creating an advanced directive? -     Chief Complaint  Patient presents with  . Acute Visit    high blood sugars    HPI:  Pt is a 70 y.o. female seen today at [facility] for an acute visit for evaluation of high blood sugars. She has a significant medical history of HTN, Type 1 DM, CKD stage 3 among other conditions. She is seen in her room today per facility Nurse request.She reports no recent loose stool. Currently on oral vancomycin. Facility Nurse reports patient's CBG's running in the 400's. Currently on low Humalog SSI, Lantus 3 units at bedtime and NAS LCS regular diet. She denies any signs of hyperglycemia.    Past Medical History:  Diagnosis Date  . Allergic rhinitis   . Anemia   . Anxiety   . Arthritis    "mostly feet, hands" (10/17/2016)  . Benign  hypertension with CKD (chronic kidney disease) stage III   . Benign paroxysmal positional vertigo   . Bipolar disorder (HCC)   . Broken finger   . Broken shoulder   . Broken toes   . Cervicalgia   . CHF (congestive heart failure) (HCC) 07/2016  . CKD (chronic kidney disease) stage 3, GFR 30-59 ml/min   . COPD (chronic obstructive pulmonary disease) (HCC)   . Depression   . Elevated liver enzymes Hep B/C neg 2014  . Fall at nursing home 10/15/2016  . GERD (gastroesophageal reflux disease)   . Glaucoma   . High cholesterol   . History of alcohol use   . History of blood transfusion 09/2016   "blood got really really low"  . Hypertension   . Hypothyroidism   . Interstitial cystitis    bladder stretched every 9 months  . Migraines    "pretty much qd" (10/17/2016)  . Psoriasis   . Stroke (HCC) 05/16/2016   Left occipital and thalamic, right hippocampal  . Thyroid disease   . Tobacco use   . 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 Vitamin B12 deficiency    Past Surgical History:  Procedure Laterality Date  . ANTERIOR CERVICAL DECOMP/DISCECTOMY FUSION  ~ 2009  . APPENDECTOMY    . BACK SURGERY    . BLADDER SUSPENSION    . CATARACT EXTRACTION W/ INTRAOCULAR LENS  IMPLANT, BILATERAL Bilateral   . DILATION AND CURETTAGE OF UTERUS    . HERNIA REPAIR    . LAPAROSCOPIC  CHOLECYSTECTOMY    . TONSILLECTOMY    . TUBAL LIGATION    . VAGINAL HYSTERECTOMY     with oophorectomy    Allergies  Allergen Reactions  . Alprazolam Other (See Comments)    Family preference, for patient to not take med  . Percocet [Oxycodone-Acetaminophen] Other (See Comments)    Family preference, for patient to not take med  . Codeine Diarrhea and Nausea And Vomiting  . Doxycycline Diarrhea and Nausea And Vomiting  . Hydrocodone Nausea And Vomiting  . Omnicef [Cefdinir] Nausea Only and Other (See Comments)    Constipation, tolerated Zosyn  . Augmentin [Amoxicillin-Pot Clavulanate]  Hives and Rash    Has patient had a PCN reaction causing immediate rash, facial/tongue/throat swelling, SOB or lightheadedness with hypotension: Yes Has patient had a PCN reaction causing severe rash involving mucus membranes or skin necrosis: Yes Did a PCN reaction that required hospitalization No Did PCN reaction occurring within the last 10 years: Yes If all of the above answers are "NO", then may proceed with Cephalosporin use.  Pt states she has taken penicillin since, and was ok with it...  . Ciprofloxacin Hives      Medication List       Accurate as of 11/01/16  5:58 PM. Always use your most recent med list.          acetaminophen 325 MG tablet Commonly known as:  TYLENOL Take 650 mg by mouth every 6 (six) hours as needed for mild pain.   amLODipine 5 MG tablet Commonly known as:  NORVASC Take 5 mg by mouth daily.   aspirin 81 MG chewable tablet Chew 1 tablet (81 mg total) by mouth daily.   atorvastatin 10 MG tablet Commonly known as:  LIPITOR Take 1 tablet (10 mg total) by mouth daily at 6 PM.   carvedilol 3.125 MG tablet Commonly known as:  COREG Take 1 tablet (3.125 mg total) by mouth 2 (two) times daily with a meal.   clopidogrel 75 MG tablet Commonly known as:  PLAVIX Take 75 mg by mouth daily.   escitalopram 20 MG tablet Commonly known as:  LEXAPRO Take 20 mg by mouth at bedtime.   famotidine 20 MG tablet Commonly known as:  PEPCID Take 1 tablet (20 mg total) by mouth daily.   insulin aspart 100 UNIT/ML injection Commonly known as:  novoLOG Sliding Scale Coverage three times a day. No HS coverage. CBG < 70: implement hypoglycemia protocol CBG 70 - 120: 0 units CBG 121 - 150: 1 unit CBG 151 - 200: 2 units CBG 201 - 250: 3 units CBG 251 - 300: 5 units CBG 301 - 350: 7 units CBG 351 - 400: 9 units   insulin glargine 100 UNIT/ML injection Commonly known as:  LANTUS Inject 3 Units into the skin at bedtime.   iron polysaccharides 150 MG  capsule Commonly known as:  NIFEREX Take 1 capsule (150 mg total) by mouth 2 (two) times daily.   meclizine 25 MG tablet Commonly known as:  ANTIVERT Take 1 tablet (25 mg total) by mouth 3 (three) times daily as needed for dizziness.   nystatin cream Commonly known as:  MYCOSTATIN Apply 1 application topically 2 (two) times daily. Under bilateral breast. Stop date 11/05/16   QUEtiapine 25 MG tablet Commonly known as:  SEROQUEL Take 12.5 mg by mouth at bedtime.   SYNTHROID 100 MCG tablet Generic drug:  levothyroxine Take 1 tablet (100 mcg total) by mouth daily before breakfast. Use only  brand name medication.   traMADol 50 MG tablet Commonly known as:  ULTRAM Take 1 tablet (50 mg total) by mouth every 6 (six) hours as needed.   UNABLE TO FIND Med Name: Med pass 120 mL by mouth 3 times daily   vancomycin 50 mg/mL oral solution Commonly known as:  VANCOCIN Take 2.5 mLs (125 mg total) by mouth 4 (four) times daily. For 11 more days       Review of Systems  Constitutional: Negative for activity change, appetite change, chills and fever.  HENT: Negative for congestion, rhinorrhea, sinus pain and sinus pressure.   Eyes: Negative.   Respiratory: Negative for cough, chest tightness, shortness of breath and wheezing.   Gastrointestinal: Negative for abdominal distention, abdominal pain, constipation, diarrhea, nausea and vomiting.  Endocrine: Negative for polydipsia, polyphagia and polyuria.  Genitourinary: Negative for dysuria, flank pain, frequency and urgency.  Musculoskeletal: Positive for gait problem.       Wears neck collar. Uses FWW   Skin: Negative for color change, pallor and rash.  Neurological: Negative for dizziness, seizures, syncope, light-headedness and headaches.  Psychiatric/Behavioral: Negative for agitation, confusion, hallucinations and sleep disturbance. The patient is not nervous/anxious.     Immunization History  Administered Date(s) Administered  .  Pneumococcal Polysaccharide-23 12/16/2008  . Td 12/16/2009   Pertinent  Health Maintenance Due  Topic Date Due  . MAMMOGRAM  08/08/1997  . DEXA SCAN  08/08/2012  . PNA vac Low Risk Adult (1 of 2 - PCV13) 08/08/2012  . URINE MICROALBUMIN  08/26/2015  . INFLUENZA VACCINE  07/16/2016  . OPHTHALMOLOGY EXAM  11/15/2016  . FOOT EXAM  01/16/2017  . HEMOGLOBIN A1C  03/21/2017  . COLONOSCOPY  10/16/2022   Fall Risk  01/17/2016  Falls in the past year? Yes  Number falls in past yr: 2 or more  Injury with Fall? No      Vitals:   11/01/16 1200  BP: 118/61  Pulse: 82  Resp: 20  Temp: 98.6 F (37 C)  SpO2: 94%  Weight: 123 lb 11.2 oz (56.1 kg)  Height: 5\' 2"  (1.575 m)   Body mass index is 22.63 kg/m. Physical Exam  Constitutional: She is oriented to person, place, and time. She appears well-developed and well-nourished. No distress.  HENT:  Head: Normocephalic.  Mouth/Throat: Oropharynx is clear and moist. No oropharyngeal exudate.  Eyes: Conjunctivae and EOM are normal. Pupils are equal, round, and reactive to light. Right eye exhibits no discharge. Left eye exhibits no discharge. No scleral icterus.  Neck: Normal range of motion. No JVD present. No thyromegaly present.  Cardiovascular: Normal rate, regular rhythm, normal heart sounds and intact distal pulses.  Exam reveals no gallop and no friction rub.   No murmur heard. Pulmonary/Chest: Effort normal and breath sounds normal. No respiratory distress. She has no wheezes. She has no rales.  Abdominal: Soft. Bowel sounds are normal. She exhibits no distension. There is no tenderness. There is no rebound and no guarding.  Musculoskeletal: She exhibits no edema, tenderness or deformity.  Moves x 4 extremities. Unsteady gait.   Lymphadenopathy:    She has no cervical adenopathy.  Neurological: She is oriented to person, place, and time.  Skin: Skin is warm and dry. No rash noted. No erythema. No pallor.  Psychiatric: She has a  normal mood and affect.    Labs reviewed:  Recent Labs  06/24/16 0403  06/25/16 0308  09/20/16 1830  10/18/16 0316 10/19/16 0614 10/21/16 0552  NA 132*  < >  135  < > 136  < > 139 136 135  K 4.3  < > 3.7  < > 3.7  < > 3.6 4.0 4.6  CL 104  < > 112*  < > 107  < > 109 109 103  CO2 10*  < > 16*  < > 20*  < > 24 22 22   GLUCOSE 531*  < > 138*  < > 147*  < > 109* 126* 385*  BUN 38*  < > 33*  < > 58*  < > 14 14 12   CREATININE 2.55*  < > 2.13*  < > 3.23*  < > 1.13* 1.31* 1.41*  CALCIUM 7.2*  < > 7.1*  < > 8.6*  < > 7.3* 7.4* 7.7*  MG 1.8  --  1.6*  --  1.6*  --   --   --   --   PHOS 3.6  --  2.0*  --  4.0  --   --   --   --   < > = values in this interval not displayed.  Recent Labs  10/09/16 0355 10/16/16 1055 10/18/16 0316  AST 37 33 18  ALT 81* 39 19  ALKPHOS 122 154* 85  BILITOT 0.7 0.7 0.1*  PROT 4.5* 6.0* 3.5*  ALBUMIN 1.8* 2.4* 1.4*    Recent Labs  09/20/16 1233  09/28/16 1720  10/16/16 1055  10/18/16 0316 10/19/16 0614 10/21/16 0552  WBC 13.2*  < > 6.5  < > 15.2*  < > 8.7 7.9 9.0  NEUTROABS 12.2*  --  4.1  --  13.5*  --   --   --   --   HGB 9.8*  < > 9.2*  < > 13.8  < > 8.5* 8.7* 9.1*  HCT 33.2*  < > 27.2*  < > 40.6  < > 25.6* 26.2* 27.8*  MCV 102.5*  < > 90.7  < > 89.4  < > 90.5 90.3 89.7  PLT 357  < > 260  < > 340  < > 258 249 298  < > = values in this interval not displayed. Lab Results  Component Value Date   TSH 135.864 (H) 10/16/2016   Lab Results  Component Value Date   HGBA1C 9.2 (H) 09/20/2016   Lab Results  Component Value Date   CHOL 185 07/13/2016   HDL 64 07/13/2016   LDLCALC 45 07/13/2016   TRIG 378 (H) 07/13/2016   CHOLHDL 2.9 07/13/2016   Assessment/Plan 1. Insulin dependent type 2 diabetes mellitus, uncontrolled (HCC) CBG's ranging in the 80's-400's. Continue Lantus 3 units at bedtime. Discontinue current  Humalog SSI then start Humalog 100 units/ml injection three times with meals per SSI for CBG <150 = 0 units; 150-250= 5 units;  251-300= 8 units; 301-350=10 units; > 350 notify provider.   2. C-diff No loose stool reported. Complete course of oral antibiotics.    Family/ staff Communication: Reviewed plan with patient and facility Nurse supervisor.   Labs/tests ordered:  None

## 2016-11-04 LAB — BASIC METABOLIC PANEL
BUN: 26 mg/dL — AB (ref 4–21)
CREATININE: 1.4 mg/dL — AB (ref 0.5–1.1)
GLUCOSE: 262 mg/dL
POTASSIUM: 4.4 mmol/L (ref 3.4–5.3)
SODIUM: 141 mmol/L (ref 137–147)

## 2016-11-05 ENCOUNTER — Encounter: Payer: Self-pay | Admitting: Internal Medicine

## 2016-11-05 ENCOUNTER — Non-Acute Institutional Stay (SKILLED_NURSING_FACILITY): Payer: Medicare Other | Admitting: Internal Medicine

## 2016-11-05 DIAGNOSIS — R5381 Other malaise: Secondary | ICD-10-CM

## 2016-11-05 DIAGNOSIS — Z794 Long term (current) use of insulin: Secondary | ICD-10-CM | POA: Diagnosis not present

## 2016-11-05 DIAGNOSIS — F4489 Other dissociative and conversion disorders: Secondary | ICD-10-CM | POA: Diagnosis not present

## 2016-11-05 DIAGNOSIS — IMO0002 Reserved for concepts with insufficient information to code with codable children: Secondary | ICD-10-CM

## 2016-11-05 DIAGNOSIS — E1165 Type 2 diabetes mellitus with hyperglycemia: Secondary | ICD-10-CM | POA: Diagnosis not present
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
## 2016-11-08 NOTE — Progress Notes (Signed)
Physical Therapy Note   Late entry for missed G-code.      10/17/16 1105  PT Visit Information  Last PT Received On 10/17/16  PT G-Codes **NOT FOR INPATIENT CLASS**  Functional Assessment Tool Used Clinical Judgement  Functional Limitation Mobility: Walking and moving around  Mobility: Walking and Moving Around Current Status [phone](G8978) CK  Mobility: Walking and Moving Around Goal Status [phone](G8979) CI   Cassandra [name], PT [phone]

## 2016-11-11 ENCOUNTER — Non-Acute Institutional Stay (SKILLED_NURSING_FACILITY): Payer: Medicare Other | Admitting: Family

## 2016-11-11 DIAGNOSIS — IMO0002 Reserved for concepts with insufficient information to code with codable children: Secondary | ICD-10-CM

## 2016-11-11 DIAGNOSIS — E1165 Type 2 diabetes mellitus with hyperglycemia: Secondary | ICD-10-CM | POA: Diagnosis not present

## 2016-11-11 DIAGNOSIS — R410 Disorientation, unspecified: Secondary | ICD-10-CM

## 2016-11-11 DIAGNOSIS — Z794 Long term (current) use of insulin: Secondary | ICD-10-CM | POA: Diagnosis not present

## 2016-11-11 DIAGNOSIS — R5381 Other malaise: Secondary | ICD-10-CM | POA: Diagnosis not present

## 2016-11-11 NOTE — Progress Notes (Signed)
Location:  [facility] Room Number: 703  Place of Service:  SNF (31) Provider: Dinah Ngetich FNP-C   Irving Copas, MD  Patient Care Team: Henrine Screws, MD as PCP - General (Family Medicine) Raj Janus, MD as Consulting Physician (Endocrinology) Janalyn Harder, MD as Consulting Physician (Dermatology) Chilton Si, MD as Attending Physician (Cardiology)  Extended Emergency Contact Information Primary Emergency Contact: King,[name] J Address: [address]          [place], [place] [address] [place] Phone: [phone] Relation: Daughter Secondary Emergency Contact: Benedetto Coons [place] Mobile Phone: [phone] Relation: Daughter  Code Status:  DNR  Goals of care: Advanced Directive information Advanced Directives 11/05/2016  Does Patient Have a Medical Advance Directive? Yes  Type of Advance Directive Out of facility DNR (pink MOST or yellow form)  Does patient want to make changes to medical advance directive? No - Patient declined  Copy of Healthcare Power of Attorney in Chart? Yes  Would patient like information on creating a medical advance directive? -     Chief Complaint  Patient presents with  . Acute Visit    High blood sugars     HPI:  Pt is a 70 y.o. female seen today at [facility]  for an acute visit for evaluation of high blood sugars. She has a significant medical history of IDDM, CVA , CKD stage 3, Depression, EOTH abuse among other conditions. She is seen in her room today. She denies any acute issues this visit though states wants to go home. She states there three people and a child outside her room having a party. She is aware that she is in a skilled Nursing facility but has periods of confusion. She sustained a fall episode 11/04/2016 while being assisted to the bathroom by the Husband. She states the husband cannot help her " He is a bad  alcoholic". Her CBG's continues to run in the 400's though states her CBG's stays high all the time. Patient's POA states patient's CBG have always been high all her life.    Past Medical History:  Diagnosis Date  . Allergic rhinitis   . Anemia   . Anxiety   . Arthritis    "mostly feet, hands" (10/17/2016)  . Benign hypertension with CKD (chronic kidney disease) stage III   . Benign paroxysmal positional vertigo   . Bipolar disorder (HCC)   . Broken finger   . Broken shoulder   . Broken toes   . Cervicalgia   . CHF (congestive heart failure) (HCC) 07/2016  . CKD (chronic kidney disease) stage 3, GFR 30-59 ml/min   . COPD (chronic obstructive pulmonary disease) (HCC)   . Depression   . Elevated liver enzymes Hep B/C neg 2014  . Fall at nursing home 10/15/2016  . GERD (gastroesophageal reflux disease)   . Glaucoma   . High cholesterol   . History of alcohol use   . History of blood transfusion 09/2016   "blood got really really low"  . Hypertension   . Hypothyroidism   . Interstitial cystitis    bladder stretched every 9 months  . Migraines    "pretty much qd" (10/17/2016)  . Psoriasis   . Stroke (HCC) 05/16/2016   Left occipital and thalamic, right hippocampal  . Thyroid disease   . Tobacco use   . 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 Vitamin B12 deficiency  Past Surgical History:  Procedure Laterality Date  . ANTERIOR CERVICAL DECOMP/DISCECTOMY FUSION  ~ 2009  . APPENDECTOMY    . BACK SURGERY    . BLADDER SUSPENSION    . CATARACT EXTRACTION W/ INTRAOCULAR LENS  IMPLANT, BILATERAL Bilateral   . DILATION AND CURETTAGE OF UTERUS    . HERNIA REPAIR    . LAPAROSCOPIC CHOLECYSTECTOMY    . TONSILLECTOMY    . TUBAL LIGATION    . VAGINAL HYSTERECTOMY     with oophorectomy    Allergies  Allergen Reactions  . Alprazolam Other (See Comments)    Family preference, for patient to not take med  . Percocet [Oxycodone-Acetaminophen] Other (See  Comments)    Family preference, for patient to not take med  . Codeine Diarrhea and Nausea And Vomiting  . Doxycycline Diarrhea and Nausea And Vomiting  . Hydrocodone Nausea And Vomiting  . Omnicef [Cefdinir] Nausea Only and Other (See Comments)    Constipation, tolerated Zosyn  . Augmentin [Amoxicillin-Pot Clavulanate] Hives and Rash    Has patient had a PCN reaction causing immediate rash, facial/tongue/throat swelling, SOB or lightheadedness with hypotension: Yes Has patient had a PCN reaction causing severe rash involving mucus membranes or skin necrosis: Yes Did a PCN reaction that required hospitalization No Did PCN reaction occurring within the last 10 years: Yes If all of the above answers are "NO", then may proceed with Cephalosporin use.  Pt states she has taken penicillin since, and was ok with it...  . Ciprofloxacin Hives      Medication List       Accurate as of 11/11/16 11:36 PM. Always use your most recent med list.          acetaminophen 325 MG tablet Commonly known as:  TYLENOL Take 650 mg by mouth every 6 (six) hours as needed for mild pain or fever.   amLODipine 5 MG tablet Commonly known as:  NORVASC Take 5 mg by mouth daily.   aspirin 81 MG chewable tablet Chew 1 tablet (81 mg total) by mouth daily.   atorvastatin 10 MG tablet Commonly known as:  LIPITOR Take 1 tablet (10 mg total) by mouth daily at 6 PM.   carvedilol 3.125 MG tablet Commonly known as:  COREG Take 1 tablet (3.125 mg total) by mouth 2 (two) times daily with a meal.   clopidogrel 75 MG tablet Commonly known as:  PLAVIX Take 75 mg by mouth daily.   escitalopram 20 MG tablet Commonly known as:  LEXAPRO Take 20 mg by mouth at bedtime.   famotidine 20 MG tablet Commonly known as:  PEPCID Take 1 tablet (20 mg total) by mouth daily.   feeding supplement (PRO-STAT SUGAR FREE 64) Liqd Take 30 mLs by mouth 2 (two) times daily.   insulin glargine 100 UNIT/ML injection Commonly  known as:  LANTUS Inject 3 Units into the skin at bedtime.   insulin lispro 100 UNIT/ML injection Commonly known as:  HUMALOG Inject into the skin 3 (three) times daily before meals. For CBGs < 150 = 0 units; 151 - 250 = 5 units, 251 - 300 = 8 units, 301 - 350 = 10 units; > 351 notify the provider.   iron polysaccharides 150 MG capsule Commonly known as:  NIFEREX Take 1 capsule (150 mg total) by mouth 2 (two) times daily.   meclizine 25 MG tablet Commonly known as:  ANTIVERT Take 1 tablet (25 mg total) by mouth 3 (three) times daily as needed for dizziness.  NUTRITIONAL SUPPLEMENT Liqd Give Med Pass 2.0  Sugar free 120 mls by mouth three times daily between meals as a feeding supplement.   nystatin-triamcinolone ointment Commonly known as:  MYCOLOG Apply ointment every shift to perianal / buttocks for rash   QUEtiapine 25 MG tablet Commonly known as:  SEROQUEL Take 12.5 mg by mouth at bedtime. At 5 pm   SYNTHROID 100 MCG tablet Generic drug:  levothyroxine Take 1 tablet (100 mcg total) by mouth daily before breakfast. Use only brand name medication.   traMADol 50 MG tablet Commonly known as:  ULTRAM Take 1 tablet (50 mg total) by mouth every 6 (six) hours as needed.   vancomycin 50 mg/mL oral solution Commonly known as:  VANCOCIN Take 2.5 mLs (125 mg total) by mouth 4 (four) times daily. For 11 more days       Review of Systems  Constitutional: Negative for activity change, appetite change, chills and fever.  HENT: Negative for congestion, rhinorrhea, sinus pain and sinus pressure.   Eyes: Negative.   Respiratory: Negative for cough, chest tightness, shortness of breath and wheezing.   Cardiovascular: Negative for chest pain, palpitations and leg swelling.  Gastrointestinal: Negative for abdominal distention, abdominal pain, constipation, diarrhea, nausea and vomiting.  Endocrine: Negative for polydipsia, polyphagia and polyuria.  Genitourinary: Negative for dysuria,  flank pain, frequency and urgency.  Musculoskeletal: Positive for gait problem.       Wears neck collar. Uses FWW   Skin: Negative for color change, pallor and rash.  Neurological: Negative for dizziness, seizures, syncope, light-headedness and headaches.  Psychiatric/Behavioral: Positive for confusion. Negative for agitation, hallucinations and sleep disturbance. The patient is not nervous/anxious.     Immunization History  Administered Date(s) Administered  . Pneumococcal Polysaccharide-23 12/16/2008  . Td 12/16/2009   Pertinent  Health Maintenance Due  Topic Date Due  . MAMMOGRAM  08/08/1997  . DEXA SCAN  08/08/2012  . PNA vac Low Risk Adult (1 of 2 - PCV13) 08/08/2012  . URINE MICROALBUMIN  08/26/2015  . INFLUENZA VACCINE  07/16/2016  . OPHTHALMOLOGY EXAM  11/15/2016  . FOOT EXAM  01/16/2017  . HEMOGLOBIN A1C  03/21/2017  . COLONOSCOPY  10/16/2022   Fall Risk  01/17/2016  Falls in the past year? Yes  Number falls in past yr: 2 or more  Injury with Fall? No      Vitals:   11/11/16 1421  BP: 140/65  Pulse: 77  Resp: 20  Temp: 98.2 F (36.8 C)  SpO2: 93%  Weight: 123 lb (55.8 kg)  Height: 5\' 2"  (1.575 m)   Body mass index is 22.5 kg/m. Physical Exam  Constitutional: She is oriented to person, place, and time. She appears well-developed and well-nourished. No distress.   confusion at times though at baseline.   HENT:  Head: Normocephalic.  Mouth/Throat: Oropharynx is clear and moist. No oropharyngeal exudate.  Eyes: Conjunctivae and EOM are normal. Pupils are equal, round, and reactive to light. Right eye exhibits no discharge. Left eye exhibits no discharge. No scleral icterus.  Neck: Normal range of motion. No JVD present. No thyromegaly present.  Cardiovascular: Normal rate, regular rhythm, normal heart sounds and intact distal pulses.  Exam reveals no gallop and no friction rub.   No murmur heard. Pulmonary/Chest: Effort normal and breath sounds normal. No  respiratory distress. She has no wheezes. She has no rales.  Abdominal: Soft. Bowel sounds are normal. She exhibits no distension. There is no tenderness. There is no rebound and  no guarding.  Musculoskeletal: She exhibits no edema, tenderness or deformity.  Moves x 4 extremities. Unsteady gait.   Lymphadenopathy:    She has no cervical adenopathy.  Neurological: She is oriented to person, place, and time.  Skin: Skin is warm and dry. No rash noted. No erythema. No pallor.  Psychiatric: She has a normal mood and affect.  Confused     Labs reviewed:  Recent Labs  06/24/16 0403  06/25/16 0308  09/20/16 1830  10/18/16 0316 10/19/16 [id number] 10/21/16 0552 11/04/16  NA 132*  < > 135  < > 136  < > 139 136 135 141  K 4.3  < > 3.7  < > 3.7  < > 3.6 4.0 4.6 4.4  CL 104  < > 112*  < > 107  < > 109 109 103  --   CO2 10*  < > 16*  < > 20*  < > 24 22 22   --   GLUCOSE 531*  < > 138*  < > 147*  < > 109* 126* 385*  --   BUN 38*  < > 33*  < > 58*  < > 14 14 12  26*  CREATININE 2.55*  < > 2.13*  < > 3.23*  < > 1.13* 1.31* 1.41* 1.4*  CALCIUM 7.2*  < > 7.1*  < > 8.6*  < > 7.3* 7.4* 7.7*  --   MG 1.8  --  1.6*  --  1.6*  --   --   --   --   --   PHOS 3.6  --  2.0*  --  4.0  --   --   --   --   --   < > = values in this interval not displayed.  Recent Labs  10/09/16 0355 10/16/16 1055 10/18/16 0316  AST 37 33 18  ALT 81* 39 19  ALKPHOS 122 154* 85  BILITOT 0.7 0.7 0.1*  PROT 4.5* 6.0* 3.5*  ALBUMIN 1.8* 2.4* 1.4*    Recent Labs  09/20/16 1233  09/28/16 1720  10/16/16 1055  10/18/16 0316 10/19/16 0614 10/21/16 0552  WBC 13.2*  < > 6.5  < > 15.2*  < > 8.7 7.9 9.0  NEUTROABS 12.2*  --  4.1  --  13.5*  --   --   --   --   HGB 9.8*  < > 9.2*  < > 13.8  < > 8.5* 8.7* 9.1*  HCT 33.2*  < > 27.2*  < > 40.6  < > 25.6* 26.2* 27.8*  MCV 102.5*  < > 90.7  < > 89.4  < > 90.5 90.3 89.7  PLT 357  < > 260  < > 340  < > 258 249 298  < > = values in this interval not displayed. Lab Results    Component Value Date   TSH 135.864 (H) 10/16/2016   Lab Results  Component Value Date   HGBA1C 9.2 (H) 09/20/2016   Lab Results  Component Value Date   CHOL 185 07/13/2016   HDL 64 07/13/2016   LDLCALC 45 07/13/2016   TRIG 378 (H) 07/13/2016   CHOLHDL 2.9 07/13/2016    Assessment/Plan DM Brittle diabetic. CBG remains elevated in the 400's. Continue Humalog per SSI. Increase Lantus to 6 units SQ at bedtime. Continue to monitor.   Physical deconditioning Has had multiple fall episodes. Continues to decline in condition. Consult Palliative care per patient's POA request. Patient previously on palliative care prior  to hosp.    Confusion  Continues to be confused at times.At baseline. Afebrile. Obtain MMSE screen for Dementia.   Family/ staff Communication: Reviewed plan of care with patient and facility Nurse supervisor.  Labs/tests ordered:  None

## 2016-11-15 ENCOUNTER — Non-Acute Institutional Stay (SKILLED_NURSING_FACILITY): Payer: Medicare Other | Admitting: Family

## 2016-11-15 DIAGNOSIS — IMO0002 Reserved for concepts with insufficient information to code with codable children: Secondary | ICD-10-CM

## 2016-11-15 DIAGNOSIS — Z794 Long term (current) use of insulin: Secondary | ICD-10-CM | POA: Diagnosis not present

## 2016-11-15 DIAGNOSIS — E1165 Type 2 diabetes mellitus with hyperglycemia: Secondary | ICD-10-CM

## 2016-11-17 NOTE — Progress Notes (Signed)
Location:  [facility] Room Number: 703  Place of Service:  SNF (31) Provider: Dinah Ngetich FNP-C  Irving CopasHACKER,ROBERT KELLER, MD  Patient Care Team: Henrine Screwsobert Thacker, MD as PCP - General (Family Medicine) Raj JanusAnna M Solum, MD as Consulting Physician (Endocrinology) Janalyn HarderStuart Tafeen, MD as Consulting Physician (Dermatology) Chilton Siiffany [place], MD as Attending Physician (Cardiology)  Extended Emergency Contact Information Primary Emergency Contact: King,[name] J Address: [address]          [place], [place] [address] [place] Mobile Phone: [phone] Relation: Daughter Secondary Emergency Contact: Benedetto CoonsSimmons,Keeley  [place] Mobile Phone: [phone] Relation: Daughter  Code Status: DNR  Goals of care: Advanced Directive information Advanced Directives 11/05/2016  Does Patient Have a Medical Advance Directive? Yes  Type of Advance Directive Out of facility DNR (pink MOST or yellow form)  Does patient want to make changes to medical advance directive? No - Patient declined  Copy of Healthcare Power of Attorney in Chart? Yes  Would patient like information on creating a medical advance directive? -     Chief Complaint  Patient presents with  . Acute Visit    elevated blood sugars     HPI:  Pt is a 70 y.o. female seen today for an acute visit for evaluation of high blood sugars.She has a significant medical history of Type 1 DM among other conditions. She seen in her room today. She denies any symptoms of hyperglycemia. Facility Nurse reports CBG's elevated in the evening previous day but CBG's readings in the 40's this morning. Facility log reviewed CBG's ranging in the 70's-120's in the morning and 100-500's in the afternoon.Facility Nurse states patient sometimes skips meals and eats cookies and other snacks at the bedside.     Past Medical History:  Diagnosis Date  . Allergic rhinitis   . Anemia   . Anxiety   .  Arthritis    "mostly feet, hands" (10/17/2016)  . Benign hypertension with CKD (chronic kidney disease) stage III   . Benign paroxysmal positional vertigo   . Bipolar disorder (HCC)   . Broken finger   . Broken shoulder   . Broken toes   . Cervicalgia   . CHF (congestive heart failure) (HCC) 07/2016  . CKD (chronic kidney disease) stage 3, GFR 30-59 ml/min   . COPD (chronic obstructive pulmonary disease) (HCC)   . Depression   . Elevated liver enzymes Hep B/C neg 2014  . Fall at nursing home 10/15/2016  . GERD (gastroesophageal reflux disease)   . Glaucoma   . High cholesterol   . History of alcohol use   . History of blood transfusion 09/2016   "blood got really really low"  . Hypertension   . Hypothyroidism   . Interstitial cystitis    bladder stretched every 9 months  . Migraines    "pretty much qd" (10/17/2016)  . Psoriasis   . Stroke (HCC) 05/16/2016   Left occipital and thalamic, right hippocampal  . Thyroid disease   . Tobacco use   . 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 Vitamin B12 deficiency    Past Surgical History:  Procedure Laterality Date  . ANTERIOR CERVICAL DECOMP/DISCECTOMY FUSION  ~ 2009  . APPENDECTOMY    . BACK SURGERY    . BLADDER SUSPENSION    . CATARACT EXTRACTION W/ INTRAOCULAR LENS  IMPLANT, BILATERAL Bilateral   . DILATION AND CURETTAGE OF UTERUS    . HERNIA REPAIR    .  LAPAROSCOPIC CHOLECYSTECTOMY    . TONSILLECTOMY    . TUBAL LIGATION    . VAGINAL HYSTERECTOMY     with oophorectomy    Allergies  Allergen Reactions  . Alprazolam Other (See Comments)    Family preference, for patient to not take med  . Percocet [Oxycodone-Acetaminophen] Other (See Comments)    Family preference, for patient to not take med  . Codeine Diarrhea and Nausea And Vomiting  . Doxycycline Diarrhea and Nausea And Vomiting  . Hydrocodone Nausea And Vomiting  . Omnicef [Cefdinir] Nausea Only and Other (See Comments)    Constipation,  tolerated Zosyn  . Augmentin [Amoxicillin-Pot Clavulanate] Hives and Rash    Has patient had a PCN reaction causing immediate rash, facial/tongue/throat swelling, SOB or lightheadedness with hypotension: Yes Has patient had a PCN reaction causing severe rash involving mucus membranes or skin necrosis: Yes Did a PCN reaction that required hospitalization No Did PCN reaction occurring within the last 10 years: Yes If all of the above answers are "NO", then may proceed with Cephalosporin use.  Pt states she has taken penicillin since, and was ok with it...  . Ciprofloxacin Hives      Medication List       Accurate as of 11/15/16 11:59 PM. Always use your most recent med list.          acetaminophen 325 MG tablet Commonly known as:  TYLENOL Take 650 mg by mouth every 6 (six) hours as needed for mild pain or fever.   amLODipine 5 MG tablet Commonly known as:  NORVASC Take 5 mg by mouth daily.   aspirin 81 MG chewable tablet Chew 1 tablet (81 mg total) by mouth daily.   atorvastatin 10 MG tablet Commonly known as:  LIPITOR Take 1 tablet (10 mg total) by mouth daily at 6 PM.   carvedilol 3.125 MG tablet Commonly known as:  COREG Take 1 tablet (3.125 mg total) by mouth 2 (two) times daily with a meal.   clopidogrel 75 MG tablet Commonly known as:  PLAVIX Take 75 mg by mouth daily.   escitalopram 20 MG tablet Commonly known as:  LEXAPRO Take 20 mg by mouth at bedtime.   famotidine 20 MG tablet Commonly known as:  PEPCID Take 1 tablet (20 mg total) by mouth daily.   feeding supplement (PRO-STAT SUGAR FREE 64) Liqd Take 30 mLs by mouth 2 (two) times daily.   insulin glargine 100 UNIT/ML injection Commonly known as:  LANTUS Inject 3 Units into the skin at bedtime.   insulin lispro 100 UNIT/ML injection Commonly known as:  HUMALOG Inject into the skin 3 (three) times daily before meals. For CBGs < 150 = 0 units; 151 - 250 = 5 units, 251 - 300 = 8 units, 301 - 350 = 10  units; > 351 notify the provider.   iron polysaccharides 150 MG capsule Commonly known as:  NIFEREX Take 1 capsule (150 mg total) by mouth 2 (two) times daily.   meclizine 25 MG tablet Commonly known as:  ANTIVERT Take 1 tablet (25 mg total) by mouth 3 (three) times daily as needed for dizziness.   NUTRITIONAL SUPPLEMENT Liqd Give Med Pass 2.0  Sugar free 120 mls by mouth three times daily between meals as a feeding supplement.   nystatin-triamcinolone ointment Commonly known as:  MYCOLOG Apply ointment every shift to perianal / buttocks for rash   QUEtiapine 25 MG tablet Commonly known as:  SEROQUEL Take 12.5 mg by mouth at bedtime.  At 5 pm   SYNTHROID 100 MCG tablet Generic drug:  levothyroxine Take 1 tablet (100 mcg total) by mouth daily before breakfast. Use only brand name medication.   traMADol 50 MG tablet Commonly known as:  ULTRAM Take 1 tablet (50 mg total) by mouth every 6 (six) hours as needed.   vancomycin 50 mg/mL oral solution Commonly known as:  VANCOCIN Take 2.5 mLs (125 mg total) by mouth 4 (four) times daily. For 11 more days       Review of Systems  Constitutional: Negative for activity change, appetite change, chills and fever.  HENT: Negative for congestion, rhinorrhea, sinus pain and sinus pressure.   Eyes: Negative.   Respiratory: Negative for cough, chest tightness, shortness of breath and wheezing.   Cardiovascular: Negative for chest pain, palpitations and leg swelling.  Gastrointestinal: Negative for abdominal distention, abdominal pain, constipation, diarrhea, nausea and vomiting.  Endocrine: Negative for polydipsia, polyphagia and polyuria.  Genitourinary: Negative for dysuria, flank pain, frequency and urgency.  Skin: Negative for color change, pallor and rash.  Neurological: Negative for dizziness, seizures, syncope, light-headedness and headaches.  Psychiatric/Behavioral: Negative for agitation, confusion, hallucinations and sleep  disturbance. The patient is not nervous/anxious.     Immunization History  Administered Date(s) Administered  . Pneumococcal Polysaccharide-23 12/16/2008  . Td 12/16/2009   Pertinent  Health Maintenance Due  Topic Date Due  . MAMMOGRAM  08/08/1997  . DEXA SCAN  08/08/2012  . PNA vac Low Risk Adult (1 of 2 - PCV13) 08/08/2012  . URINE MICROALBUMIN  08/26/2015  . INFLUENZA VACCINE  07/16/2016  . OPHTHALMOLOGY EXAM  11/15/2016  . FOOT EXAM  01/16/2017  . HEMOGLOBIN A1C  03/21/2017  . COLONOSCOPY  10/16/2022   Fall Risk  01/17/2016  Falls in the past year? Yes  Number falls in past yr: 2 or more  Injury with Fall? No      Vitals:   11/15/16 1200  BP: 139/66  Pulse: 81  Resp: 18  Temp: 98.7 F (37.1 C)  SpO2: 99%  Weight: 121 lb 3.2 oz (55 kg)  Height: 5\' 2"  (1.575 m)   Body mass index is 22.17 kg/m. Physical Exam  Constitutional: She is oriented to person, place, and time. She appears well-developed and well-nourished. No distress.  HENT:  Head: Normocephalic.  Mouth/Throat: Oropharynx is clear and moist. No oropharyngeal exudate.  Eyes: Conjunctivae and EOM are normal. Pupils are equal, round, and reactive to light. Right eye exhibits no discharge. Left eye exhibits no discharge. No scleral icterus.  Neck: Normal range of motion. No JVD present. No thyromegaly present.  Cardiovascular: Normal rate, regular rhythm, normal heart sounds and intact distal pulses.  Exam reveals no gallop and no friction rub.   No murmur heard. Pulmonary/Chest: Effort normal and breath sounds normal. No respiratory distress. She has no wheezes. She has no rales.  Abdominal: Soft. Bowel sounds are normal. She exhibits no distension. There is no tenderness. There is no rebound and no guarding.  Musculoskeletal: She exhibits no edema, tenderness or deformity.  Moves x 4 extremities. Unsteady gait.   Lymphadenopathy:    She has no cervical adenopathy.  Neurological: She is oriented to person,  place, and time.  Skin: Skin is warm and dry. No rash noted. No erythema. No pallor.  Psychiatric: She has a normal mood and affect.    Labs reviewed:  Recent Labs  06/24/16 0403  06/25/16 0308  09/20/16 1830  10/18/16 0316 10/19/16 0981 10/21/16 0552 11/04/16  NA 132*  < > 135  < > 136  < > 139 136 135 141  K 4.3  < > 3.7  < > 3.7  < > 3.6 4.0 4.6 4.4  CL 104  < > 112*  < > 107  < > 109 109 103  --   CO2 10*  < > 16*  < > 20*  < > 24 22 22   --   GLUCOSE 531*  < > 138*  < > 147*  < > 109* 126* 385*  --   BUN 38*  < > 33*  < > 58*  < > 14 14 12  26*  CREATININE 2.55*  < > 2.13*  < > 3.23*  < > 1.13* 1.31* 1.41* 1.4*  CALCIUM 7.2*  < > 7.1*  < > 8.6*  < > 7.3* 7.4* 7.7*  --   MG 1.8  --  1.6*  --  1.6*  --   --   --   --   --   PHOS 3.6  --  2.0*  --  4.0  --   --   --   --   --   < > = values in this interval not displayed.  Recent Labs  10/09/16 0355 10/16/16 1055 10/18/16 0316  AST 37 33 18  ALT 81* 39 19  ALKPHOS 122 154* 85  BILITOT 0.7 0.7 0.1*  PROT 4.5* 6.0* 3.5*  ALBUMIN 1.8* 2.4* 1.4*    Recent Labs  09/20/16 1233  09/28/16 1720  10/16/16 1055  10/18/16 0316 10/19/16 0614 10/21/16 0552  WBC 13.2*  < > 6.5  < > 15.2*  < > 8.7 7.9 9.0  NEUTROABS 12.2*  --  4.1  --  13.5*  --   --   --   --   HGB 9.8*  < > 9.2*  < > 13.8  < > 8.5* 8.7* 9.1*  HCT 33.2*  < > 27.2*  < > 40.6  < > 25.6* 26.2* 27.8*  MCV 102.5*  < > 90.7  < > 89.4  < > 90.5 90.3 89.7  PLT 357  < > 260  < > 340  < > 258 249 298  < > = values in this interval not displayed. Lab Results  Component Value Date   TSH 135.864 (H) 10/16/2016   Lab Results  Component Value Date   HGBA1C 9.2 (H) 09/20/2016   Lab Results  Component Value Date   CHOL 185 07/13/2016   HDL 64 07/13/2016   LDLCALC 45 07/13/2016   TRIG 378 (H) 07/13/2016   CHOLHDL 2.9 07/13/2016   Assessment/Plan  Insulin dependent type 1 diabetes mellitus, uncontrolled (HCC) Fluctuating CBG's continue on Humalog per sliding  scale. Change Lantus to 5 units SQ in daily in the morning. Monitor Hgb A1C.     Family/ staff Communication: Reviewed plan of care with patient and facility Nurse supervisor.   Labs/tests ordered: None

## 2016-11-18 LAB — HEMOGLOBIN A1C
HEMOGLOBIN A1C: 7.5
Hemoglobin A1C: 7.5

## 2016-11-18 LAB — LIPID PANEL
Cholesterol: 218 mg/dL — AB (ref 0–200)
HDL: 82 mg/dL — AB (ref 35–70)
LDL CALC: 99 mg/dL
TRIGLYCERIDES: 189 mg/dL — AB (ref 40–160)

## 2016-11-18 LAB — TSH: TSH: 50.1 u[IU]/mL — AB (ref 0.41–5.90)

## 2016-11-19 ENCOUNTER — Non-Acute Institutional Stay (SKILLED_NURSING_FACILITY): Payer: Medicare Other | Admitting: Family

## 2016-11-19 DIAGNOSIS — E782 Mixed hyperlipidemia: Secondary | ICD-10-CM

## 2016-11-19 DIAGNOSIS — E039 Hypothyroidism, unspecified: Secondary | ICD-10-CM | POA: Diagnosis not present

## 2016-11-19 DIAGNOSIS — E1165 Type 2 diabetes mellitus with hyperglycemia: Secondary | ICD-10-CM

## 2016-11-19 DIAGNOSIS — Z794 Long term (current) use of insulin: Secondary | ICD-10-CM

## 2016-11-19 DIAGNOSIS — IMO0002 Reserved for concepts with insufficient information to code with codable children: Secondary | ICD-10-CM

## 2016-11-19 MED ORDER — SYNTHROID 100 MCG PO TABS: 100.0000 ug | ORAL_TABLET | Freq: Every day | ORAL | Status: DC

## 2016-11-19 NOTE — Progress Notes (Addendum)
Location:  [facility] Room Number: 703  Place of Service:  SNF (31) Provider: Dinah Ngetich FNP-C   Irving Copas, MD  Patient Care Team: Henrine Screws, MD as PCP - General (Family Medicine) Raj Janus, MD as Consulting Physician (Endocrinology) Janalyn Harder, MD as Consulting Physician (Dermatology) Chilton Si, MD as Attending Physician (Cardiology)  Extended Emergency Contact Information Primary Emergency Contact: King,[name] J Address: [address]          [place], [place] [address] [place] Phone: [phone] Relation: Daughter Secondary Emergency Contact: Benedetto Coons [place] Mobile Phone: [phone] Relation: Daughter  Code Status:  DNR  Goals of care: Advanced Directive information Advanced Directives 11/05/2016  Does Patient Have a Medical Advance Directive? Yes  Type of Advance Directive Out of facility DNR (pink MOST or yellow form)  Does patient want to make changes to medical advance directive? No - Patient declined  Copy of Healthcare Power of Attorney in Chart? Yes  Would patient like information on creating a medical advance directive? -     Chief Complaint  Patient presents with  . Acute Visit    abnormal labs     HPI:  Pt is a 70 y.o. female seen today at [facility] for an acute visit for abnormal lab results. She has a significant medical history of hypothyroidism, Type 1 DM, HTN, Hyperlipidemia among other conditions. She is seen in her room today. She denies any acute issues this visit. Her recent lab results showed Hgb A1C 7.5, TSH > 50, Chol 218, TRG 189 ( 11/18/2016). Facility Nurse reports patient's CBG was in the 40's around  12: 27 am orange juice given. Morning CBG's 132 at 6 AM, 161 at 9 Am and 365 at 12; 29 pm.    Past Medical History:  Diagnosis Date  . Allergic rhinitis   . Anemia   . Anxiety   . Arthritis    "mostly feet,  hands" (10/17/2016)  . Benign hypertension with CKD (chronic kidney disease) stage III   . Benign paroxysmal positional vertigo   . Bipolar disorder (HCC)   . Broken finger   . Broken shoulder   . Broken toes   . Cervicalgia   . CHF (congestive heart failure) (HCC) 07/2016  . CKD (chronic kidney disease) stage 3, GFR 30-59 ml/min   . COPD (chronic obstructive pulmonary disease) (HCC)   . Depression   . Elevated liver enzymes Hep B/C neg 2014  . Fall at nursing home 10/15/2016  . GERD (gastroesophageal reflux disease)   . Glaucoma   . High cholesterol   . History of alcohol use   . History of blood transfusion 09/2016   "blood got really really low"  . Hypertension   . Hypothyroidism   . Interstitial cystitis    bladder stretched every 9 months  . Migraines    "pretty much qd" (10/17/2016)  . Psoriasis   . Stroke (HCC) 05/16/2016   Left occipital and thalamic, right hippocampal  . Thyroid disease   . Tobacco use   . 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 Vitamin B12 deficiency    Past Surgical History:  Procedure Laterality Date  . ANTERIOR CERVICAL DECOMP/DISCECTOMY FUSION  ~ 2009  . APPENDECTOMY    . BACK SURGERY    . BLADDER SUSPENSION    . CATARACT EXTRACTION W/ INTRAOCULAR LENS  IMPLANT, BILATERAL Bilateral   .  DILATION AND CURETTAGE OF UTERUS    . HERNIA REPAIR    . LAPAROSCOPIC CHOLECYSTECTOMY    . TONSILLECTOMY    . TUBAL LIGATION    . VAGINAL HYSTERECTOMY     with oophorectomy    Allergies  Allergen Reactions  . Alprazolam Other (See Comments)    Family preference, for patient to not take med  . Percocet [Oxycodone-Acetaminophen] Other (See Comments)    Family preference, for patient to not take med  . Codeine Diarrhea and Nausea And Vomiting  . Doxycycline Diarrhea and Nausea And Vomiting  . Hydrocodone Nausea And Vomiting  . Omnicef [Cefdinir] Nausea Only and Other (See Comments)    Constipation, tolerated Zosyn  . Augmentin  [Amoxicillin-Pot Clavulanate] Hives and Rash    Has patient had a PCN reaction causing immediate rash, facial/tongue/throat swelling, SOB or lightheadedness with hypotension: Yes Has patient had a PCN reaction causing severe rash involving mucus membranes or skin necrosis: Yes Did a PCN reaction that required hospitalization No Did PCN reaction occurring within the last 10 years: Yes If all of the above answers are "NO", then may proceed with Cephalosporin use.  Pt states she has taken penicillin since, and was ok with it...  . Ciprofloxacin Hives      Medication List       Accurate as of 11/19/16  7:33 PM. Always use your most recent med list.          acetaminophen 325 MG tablet Commonly known as:  TYLENOL Take 650 mg by mouth every 6 (six) hours as needed for mild pain or fever.   amLODipine 5 MG tablet Commonly known as:  NORVASC Take 5 mg by mouth daily.   aspirin 81 MG chewable tablet Chew 1 tablet (81 mg total) by mouth daily.   atorvastatin 10 MG tablet Commonly known as:  LIPITOR Take 1 tablet (10 mg total) by mouth daily at 6 PM.   carvedilol 3.125 MG tablet Commonly known as:  COREG Take 1 tablet (3.125 mg total) by mouth 2 (two) times daily with a meal.   clopidogrel 75 MG tablet Commonly known as:  PLAVIX Take 75 mg by mouth daily.   escitalopram 20 MG tablet Commonly known as:  LEXAPRO Take 20 mg by mouth at bedtime.   famotidine 20 MG tablet Commonly known as:  PEPCID Take 1 tablet (20 mg total) by mouth daily.   feeding supplement (PRO-STAT SUGAR FREE 64) Liqd Take 30 mLs by mouth 2 (two) times daily.   insulin lispro 100 UNIT/ML injection Commonly known as:  HUMALOG Inject into the skin 3 (three) times daily before meals. For CBGs < 150 = 0 units; 151 - 250 = 5 units, 251 - 300 = 8 units, 301 - 350 = 10 units; > 351 notify the provider.   iron polysaccharides 150 MG capsule Commonly known as:  NIFEREX Take 1 capsule (150 mg total) by mouth 2  (two) times daily.   meclizine 25 MG tablet Commonly known as:  ANTIVERT Take 1 tablet (25 mg total) by mouth 3 (three) times daily as needed for dizziness.   NUTRITIONAL SUPPLEMENT Liqd Give Med Pass 2.0  Sugar free 120 mls by mouth three times daily between meals as a feeding supplement.   nystatin-triamcinolone ointment Commonly known as:  MYCOLOG Apply ointment every shift to perianal / buttocks for rash   QUEtiapine 25 MG tablet Commonly known as:  SEROQUEL Take 12.5 mg by mouth at bedtime. At 5 pm  SYNTHROID 100 MCG tablet Generic drug:  levothyroxine Take 1 tablet (100 mcg total) by mouth daily before breakfast. Take along with 25 mcg take 1/2 tablet daily for a total of 112 mcg Tablet. Use only brand name medication.   traMADol 50 MG tablet Commonly known as:  ULTRAM Take 1 tablet (50 mg total) by mouth every 6 (six) hours as needed.   vancomycin 50 mg/mL oral solution Commonly known as:  VANCOCIN Take 2.5 mLs (125 mg total) by mouth 4 (four) times daily. For 11 more days       Review of Systems  Constitutional: Negative for activity change, appetite change, chills and fever.  HENT: Negative for congestion, rhinorrhea, sinus pain and sinus pressure.   Eyes: Negative.   Respiratory: Negative for cough, chest tightness, shortness of breath and wheezing.   Cardiovascular: Negative for chest pain, palpitations and leg swelling.  Gastrointestinal: Negative for abdominal distention, abdominal pain, constipation, diarrhea, nausea and vomiting.  Endocrine: Negative for cold intolerance, heat intolerance, polydipsia, polyphagia and polyuria.  Genitourinary: Negative for dysuria, flank pain, frequency and urgency.  Musculoskeletal: Positive for gait problem.  Skin: Negative for color change, pallor and rash.  Neurological: Negative for dizziness, seizures, syncope, light-headedness and headaches.  Psychiatric/Behavioral: Negative for agitation, confusion, hallucinations and  sleep disturbance. The patient is not nervous/anxious.     Immunization History  Administered Date(s) Administered  . Pneumococcal Polysaccharide-23 12/16/2008  . Td 12/16/2009   Pertinent  Health Maintenance Due  Topic Date Due  . MAMMOGRAM  08/08/1997  . DEXA SCAN  08/08/2012  . PNA vac Low Risk Adult (1 of 2 - PCV13) 08/08/2012  . URINE MICROALBUMIN  08/26/2015  . INFLUENZA VACCINE  07/16/2016  . OPHTHALMOLOGY EXAM  11/15/2016  . FOOT EXAM  01/16/2017  . HEMOGLOBIN A1C  03/21/2017  . COLONOSCOPY  10/16/2022   Fall Risk  01/17/2016  Falls in the past year? Yes  Number falls in past yr: 2 or more  Injury with Fall? No      Vitals:   11/19/16 1015  BP: 138/62  Pulse: 73  Resp: 20  Temp: 97.3 F (36.3 C)  SpO2: 95%  Weight: 121 lb 3.2 oz (55 kg)  Height: 5\' 2"  (1.575 m)   Body mass index is 22.17 kg/m. Physical Exam  Constitutional: She is oriented to person, place, and time. She appears well-developed and well-nourished. No distress.   confusion at times though at baseline.   HENT:  Head: Normocephalic.  Mouth/Throat: Oropharynx is clear and moist. No oropharyngeal exudate.  Eyes: Conjunctivae and EOM are normal. Pupils are equal, round, and reactive to light. Right eye exhibits no discharge. Left eye exhibits no discharge. No scleral icterus.  Neck: Normal range of motion. No JVD present. No thyromegaly present.  Cardiovascular: Normal rate, regular rhythm, normal heart sounds and intact distal pulses.  Exam reveals no gallop and no friction rub.   No murmur heard. Pulmonary/Chest: Effort normal and breath sounds normal. No respiratory distress. She has no wheezes. She has no rales.  Abdominal: Soft. Bowel sounds are normal. She exhibits no distension. There is no tenderness. There is no rebound and no guarding.  Musculoskeletal: She exhibits no edema, tenderness or deformity.  Moves x 4 extremities. Unsteady gait. Hard neck collar in place.   Lymphadenopathy:     She has no cervical adenopathy.  Neurological: She is oriented to person, place, and time.  Skin: Skin is warm and dry. No rash noted. No erythema. No  pallor.  Psychiatric: She has a normal mood and affect.  Confused     Labs reviewed:  Recent Labs  06/24/16 0403  06/25/16 0308  09/20/16 1830  10/18/16 0316 10/19/16 1610 10/21/16 0552 11/04/16  NA 132*  < > 135  < > 136  < > 139 136 135 141  K 4.3  < > 3.7  < > 3.7  < > 3.6 4.0 4.6 4.4  CL 104  < > 112*  < > 107  < > 109 109 103  --   CO2 10*  < > 16*  < > 20*  < > 24 22 22   --   GLUCOSE 531*  < > 138*  < > 147*  < > 109* 126* 385*  --   BUN 38*  < > 33*  < > 58*  < > 14 14 12  26*  CREATININE 2.55*  < > 2.13*  < > 3.23*  < > 1.13* 1.31* 1.41* 1.4*  CALCIUM 7.2*  < > 7.1*  < > 8.6*  < > 7.3* 7.4* 7.7*  --   MG 1.8  --  1.6*  --  1.6*  --   --   --   --   --   PHOS 3.6  --  2.0*  --  4.0  --   --   --   --   --   < > = values in this interval not displayed.  Recent Labs  10/09/16 0355 10/16/16 1055 10/18/16 0316  AST 37 33 18  ALT 81* 39 19  ALKPHOS 122 154* 85  BILITOT 0.7 0.7 0.1*  PROT 4.5* 6.0* 3.5*  ALBUMIN 1.8* 2.4* 1.4*    Recent Labs  09/20/16 1233  09/28/16 1720  10/16/16 1055  10/18/16 0316 10/19/16 0614 10/21/16 0552  WBC 13.2*  < > 6.5  < > 15.2*  < > 8.7 7.9 9.0  NEUTROABS 12.2*  --  4.1  --  13.5*  --   --   --   --   HGB 9.8*  < > 9.2*  < > 13.8  < > 8.5* 8.7* 9.1*  HCT 33.2*  < > 27.2*  < > 40.6  < > 25.6* 26.2* 27.8*  MCV 102.5*  < > 90.7  < > 89.4  < > 90.5 90.3 89.7  PLT 357  < > 260  < > 340  < > 258 249 298  < > = values in this interval not displayed. Lab Results  Component Value Date   TSH 50.10 (A) 11/18/2016   Lab Results  Component Value Date   HGBA1C 7.5 11/18/2016   Lab Results  Component Value Date   CHOL 218 (A) 11/18/2016   HDL 82 (A) 11/18/2016   LDLCALC 99 11/18/2016   TRIG 189 (A) 11/18/2016   CHOLHDL 2.9 07/13/2016   Assessment/Plan 1. Hypothyroidism,  unspecified type TSH results > 50 ( 11/18/2016). Increase synthroid to 112 mcg tablet daily before breakfast. Recheck TSH in 8 weeks.   2. Insulin dependent type 2 diabetes mellitus, uncontrolled (HCC) Midnight hypoglycemia in the 40's reported and Hyperglycemia during the day.Morning  Hypoglycemia reported with administration of Lantus at bedtime. Will discontinue Lantus 6 units for now. Continue on Humalog per SSI. Continue to monitor.   3. Mixed hyperlipidemia Chol 218, TRG 189 ( 11/18/2016).Continue on lipitor 10 mg Tablet daily. Monitor lipid panel.   Family/ staff Communication: Reviewed plan of care with patient and facility Nurse  supervisor.   Labs/tests ordered:  Recheck TSH in 8 weeks.

## 2016-11-21 ENCOUNTER — Non-Acute Institutional Stay (SKILLED_NURSING_FACILITY): Payer: Medicare Other | Admitting: Family

## 2016-11-21 DIAGNOSIS — E782 Mixed hyperlipidemia: Secondary | ICD-10-CM | POA: Diagnosis not present

## 2016-11-21 DIAGNOSIS — I5032 Chronic diastolic (congestive) heart failure: Secondary | ICD-10-CM

## 2016-11-21 DIAGNOSIS — E1165 Type 2 diabetes mellitus with hyperglycemia: Secondary | ICD-10-CM | POA: Diagnosis not present

## 2016-11-21 DIAGNOSIS — E039 Hypothyroidism, unspecified: Secondary | ICD-10-CM

## 2016-11-21 DIAGNOSIS — Z794 Long term (current) use of insulin: Secondary | ICD-10-CM

## 2016-11-21 DIAGNOSIS — IMO0002 Reserved for concepts with insufficient information to code with codable children: Secondary | ICD-10-CM

## 2016-11-21 DIAGNOSIS — I129 Hypertensive chronic kidney disease with stage 1 through stage 4 chronic kidney disease, or unspecified chronic kidney disease: Secondary | ICD-10-CM | POA: Diagnosis not present

## 2016-11-21 DIAGNOSIS — N183 Chronic kidney disease, stage 3 unspecified: Secondary | ICD-10-CM

## 2016-11-21 MED ORDER — INSULIN ASPART PROT & ASPART (70-30 MIX) 100 UNIT/ML ~~LOC~~ SUSP: 5.0000 [IU] | Freq: Two times a day (BID) | SUBCUTANEOUS | Status: DC

## 2016-11-21 MED ORDER — INSULIN LISPRO 100 UNIT/ML ~~LOC~~ SOLN: 1.0000 [IU] | Freq: Three times a day (TID) | SUBCUTANEOUS | Status: DC

## 2016-11-21 NOTE — Progress Notes (Signed)
Location:  [facility] Room Number: 703 Place of Service:  SNF (31) Provider: Dinah Ngetich FNP-C   Irving Copas, MD  Patient Care Team: Henrine Screws, MD as PCP - General (Family Medicine) Raj Janus, MD as Consulting Physician (Endocrinology) Janalyn Harder, MD as Consulting Physician (Dermatology) Chilton Si, MD as Attending Physician (Cardiology)  Extended Emergency Contact Information Primary Emergency Contact: King,[name] J Address: [address]          [place], [place] [address] [place] Phone: [phone] Relation: Daughter Secondary Emergency Contact: Benedetto Coons [place] Mobile Phone: [phone] Relation: Daughter  Code Status:  DNR  Goals of care: Advanced Directive information Advanced Directives 11/05/2016  Does Patient Have a Medical Advance Directive? Yes  Type of Advance Directive Out of facility DNR (pink MOST or yellow form)  Does patient want to make changes to medical advance directive? No - Patient declined  Copy of Healthcare Power of Attorney in Chart? Yes  Would patient like information on creating a medical advance directive? -     Chief Complaint  Patient presents with  . Medical Management of Chronic Issues    HPI:  Pt is a 70 y.o. female seen today at [facility]  for medical management of chronic diseases.She has a medical history of HTN, CHF, CKD stage 3, Hyperlipidemia, CVA, Hypothyroidism among other conditions.She is seen in her room today. She is pleasantly confused.she denies any acute issues this visit. Facility Nurse reports CBG's ranging 70's -upper 300's with occassional in the 40's and 50's. Facility Nurse reports juices and snacks given CBG rechecked 158. Patient states CBG's always run high at home for a long time.      Past Medical History:  Diagnosis Date  . Allergic rhinitis   . Anemia   . Anxiety   . Arthritis      "mostly feet, hands" (10/17/2016)  . Benign hypertension with CKD (chronic kidney disease) stage III   . Benign paroxysmal positional vertigo   . Bipolar disorder (HCC)   . Broken finger   . Broken shoulder   . Broken toes   . Cervicalgia   . CHF (congestive heart failure) (HCC) 07/2016  . CKD (chronic kidney disease) stage 3, GFR 30-59 ml/min   . COPD (chronic obstructive pulmonary disease) (HCC)   . Depression   . Elevated liver enzymes Hep B/C neg 2014  . Fall at nursing home 10/15/2016  . GERD (gastroesophageal reflux disease)   . Glaucoma   . High cholesterol   . History of alcohol use   . History of blood transfusion 09/2016   "blood got really really low"  . Hypertension   . Hypothyroidism   . Interstitial cystitis    bladder stretched every 9 months  . Migraines    "pretty much qd" (10/17/2016)  . Psoriasis   . Stroke (HCC) 05/16/2016   Left occipital and thalamic, right hippocampal  . Thyroid disease   . Tobacco use   . 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 Vitamin B12 deficiency    Past Surgical History:  Procedure Laterality Date  . ANTERIOR CERVICAL DECOMP/DISCECTOMY FUSION  ~ 2009  . APPENDECTOMY    . BACK SURGERY    . BLADDER SUSPENSION    . CATARACT EXTRACTION W/ INTRAOCULAR LENS  IMPLANT, BILATERAL Bilateral   . DILATION AND CURETTAGE OF UTERUS    . HERNIA REPAIR    .  LAPAROSCOPIC CHOLECYSTECTOMY    . TONSILLECTOMY    . TUBAL LIGATION    . VAGINAL HYSTERECTOMY     with oophorectomy    Allergies  Allergen Reactions  . Alprazolam Other (See Comments)    Family preference, for patient to not take med  . Percocet [Oxycodone-Acetaminophen] Other (See Comments)    Family preference, for patient to not take med  . Codeine Diarrhea and Nausea And Vomiting  . Doxycycline Diarrhea and Nausea And Vomiting  . Hydrocodone Nausea And Vomiting  . Omnicef [Cefdinir] Nausea Only and Other (See Comments)    Constipation, tolerated Zosyn   . Augmentin [Amoxicillin-Pot Clavulanate] Hives and Rash    Has patient had a PCN reaction causing immediate rash, facial/tongue/throat swelling, SOB or lightheadedness with hypotension: Yes Has patient had a PCN reaction causing severe rash involving mucus membranes or skin necrosis: Yes Did a PCN reaction that required hospitalization No Did PCN reaction occurring within the last 10 years: Yes If all of the above answers are "NO", then may proceed with Cephalosporin use.  Pt states she has taken penicillin since, and was ok with it...  . Ciprofloxacin Hives      Medication List       Accurate as of 11/21/16  3:02 PM. Always use your most recent med list.          acetaminophen 325 MG tablet Commonly known as:  TYLENOL Take 650 mg by mouth every 6 (six) hours as needed for mild pain or fever.   amLODipine 5 MG tablet Commonly known as:  NORVASC Take 5 mg by mouth daily.   aspirin 81 MG chewable tablet Chew 1 tablet (81 mg total) by mouth daily.   atorvastatin 10 MG tablet Commonly known as:  LIPITOR Take 1 tablet (10 mg total) by mouth daily at 6 PM.   carvedilol 3.125 MG tablet Commonly known as:  COREG Take 1 tablet (3.125 mg total) by mouth 2 (two) times daily with a meal.   clopidogrel 75 MG tablet Commonly known as:  PLAVIX Take 75 mg by mouth daily.   escitalopram 20 MG tablet Commonly known as:  LEXAPRO Take 20 mg by mouth at bedtime.   famotidine 20 MG tablet Commonly known as:  PEPCID Take 1 tablet (20 mg total) by mouth daily.   feeding supplement (PRO-STAT SUGAR FREE 64) Liqd Take 30 mLs by mouth 2 (two) times daily.   insulin lispro 100 UNIT/ML injection Commonly known as:  HUMALOG Inject into the skin 3 (three) times daily before meals. For CBGs < 150 = 0 units; 151 - 250 = 5 units, 251 - 300 = 8 units, 301 - 350 = 10 units; > 351 notify the provider.   iron polysaccharides 150 MG capsule Commonly known as:  NIFEREX Take 1 capsule (150 mg  total) by mouth 2 (two) times daily.   meclizine 25 MG tablet Commonly known as:  ANTIVERT Take 1 tablet (25 mg total) by mouth 3 (three) times daily as needed for dizziness.   NUTRITIONAL SUPPLEMENT Liqd Give Med Pass 2.0  Sugar free 120 mls by mouth three times daily between meals as a feeding supplement.   nystatin-triamcinolone ointment Commonly known as:  MYCOLOG Apply ointment every shift to perianal / buttocks for rash   SYNTHROID 100 MCG tablet Generic drug:  levothyroxine Take 1 tablet (100 mcg total) by mouth daily before breakfast. Take along with 25 mcg take 1/2 tablet daily for a total of 112 mcg Tablet.  Use only brand name medication.   traMADol 50 MG tablet Commonly known as:  ULTRAM Take 1 tablet (50 mg total) by mouth every 6 (six) hours as needed.       Review of Systems  Constitutional: Negative for activity change, appetite change, chills and fever.  HENT: Negative for congestion, rhinorrhea, sinus pain and sinus pressure.   Eyes: Negative.   Respiratory: Negative for cough, chest tightness, shortness of breath and wheezing.   Cardiovascular: Negative for chest pain, palpitations and leg swelling.  Gastrointestinal: Negative for abdominal distention, abdominal pain, constipation, diarrhea, nausea and vomiting.  Endocrine: Negative for cold intolerance, heat intolerance, polydipsia, polyphagia and polyuria.  Genitourinary: Negative for dysuria, flank pain, frequency and urgency.  Musculoskeletal: Positive for gait problem.  Skin: Negative for color change, pallor and rash.  Neurological: Negative for dizziness, seizures, syncope, light-headedness and headaches.  Hematological: Does not bruise/bleed easily.  Psychiatric/Behavioral: Negative for agitation, confusion, hallucinations and sleep disturbance. The patient is not nervous/anxious.     Immunization History  Administered Date(s) Administered  . Pneumococcal Polysaccharide-23 12/16/2008  . Td  12/16/2009   Pertinent  Health Maintenance Due  Topic Date Due  . MAMMOGRAM  08/08/1997  . DEXA SCAN  08/08/2012  . PNA vac Low Risk Adult (1 of 2 - PCV13) 08/08/2012  . URINE MICROALBUMIN  08/26/2015  . INFLUENZA VACCINE  07/16/2016  . OPHTHALMOLOGY EXAM  11/15/2016  . FOOT EXAM  01/16/2017  . HEMOGLOBIN A1C  05/19/2017  . COLONOSCOPY  10/16/2022   Fall Risk  01/17/2016  Falls in the past year? Yes  Number falls in past yr: 2 or more  Injury with Fall? No      Vitals:   11/21/16 1100  BP: (!) 144/87  Pulse: 70  Resp: 20  Temp: 97 F (36.1 C)  SpO2: 95%  Weight: 124 lb 6.4 oz (56.4 kg)  Height: 5\' 2"  (1.575 m)   Body mass index is 22.75 kg/m. Physical Exam  Constitutional: She is oriented to person, place, and time. She appears well-developed and well-nourished. No distress.   Confusion at baseline.   HENT:  Head: Normocephalic.  Mouth/Throat: Oropharynx is clear and moist. No oropharyngeal exudate.  Eyes: Conjunctivae and EOM are normal. Pupils are equal, round, and reactive to light. Right eye exhibits no discharge. Left eye exhibits no discharge. No scleral icterus.  Neck: Normal range of motion. No JVD present. No thyromegaly present.  Cardiovascular: Normal rate, regular rhythm, normal heart sounds and intact distal pulses.  Exam reveals no gallop and no friction rub.   No murmur heard. Pulmonary/Chest: Effort normal and breath sounds normal. No respiratory distress. She has no wheezes. She has no rales.  Abdominal: Soft. Bowel sounds are normal. She exhibits no distension. There is no tenderness. There is no rebound and no guarding.  Musculoskeletal: She exhibits no edema, tenderness or deformity.  Moves x 4 extremities.Unsteady gait. Hard neck collar in place.   Lymphadenopathy:    She has no cervical adenopathy.  Neurological: She is oriented to person, place, and time.  Confused at times but at baseline.   Skin: Skin is warm and dry. No rash noted. No  erythema. No pallor.  Psychiatric: She has a normal mood and affect.  Confused at baseline.     Labs reviewed:  Recent Labs  06/24/16 0403  06/25/16 0308  09/20/16 1830  10/18/16 0316 10/19/16 1610 10/21/16 0552 11/04/16  NA 132*  < > 135  < > 136  < >  139 136 135 141  K 4.3  < > 3.7  < > 3.7  < > 3.6 4.0 4.6 4.4  CL 104  < > 112*  < > 107  < > 109 109 103  --   CO2 10*  < > 16*  < > 20*  < > 24 22 22   --   GLUCOSE 531*  < > 138*  < > 147*  < > 109* 126* 385*  --   BUN 38*  < > 33*  < > 58*  < > 14 14 12  26*  CREATININE 2.55*  < > 2.13*  < > 3.23*  < > 1.13* 1.31* 1.41* 1.4*  CALCIUM 7.2*  < > 7.1*  < > 8.6*  < > 7.3* 7.4* 7.7*  --   MG 1.8  --  1.6*  --  1.6*  --   --   --   --   --   PHOS 3.6  --  2.0*  --  4.0  --   --   --   --   --   < > = values in this interval not displayed.  Recent Labs  10/09/16 0355 10/16/16 1055 10/18/16 0316  AST 37 33 18  ALT 81* 39 19  ALKPHOS 122 154* 85  BILITOT 0.7 0.7 0.1*  PROT 4.5* 6.0* 3.5*  ALBUMIN 1.8* 2.4* 1.4*    Recent Labs  09/20/16 1233  09/28/16 1720  10/16/16 1055  10/18/16 0316 10/19/16 0614 10/21/16 0552  WBC 13.2*  < > 6.5  < > 15.2*  < > 8.7 7.9 9.0  NEUTROABS 12.2*  --  4.1  --  13.5*  --   --   --   --   HGB 9.8*  < > 9.2*  < > 13.8  < > 8.5* 8.7* 9.1*  HCT 33.2*  < > 27.2*  < > 40.6  < > 25.6* 26.2* 27.8*  MCV 102.5*  < > 90.7  < > 89.4  < > 90.5 90.3 89.7  PLT 357  < > 260  < > 340  < > 258 249 298  < > = values in this interval not displayed. Lab Results  Component Value Date   TSH 50.10 (A) 11/18/2016   Lab Results  Component Value Date   HGBA1C 7.5 11/18/2016   Lab Results  Component Value Date   CHOL 218 (A) 11/18/2016   HDL 82 (A) 11/18/2016   LDLCALC 99 11/18/2016   TRIG 189 (A) 11/18/2016   CHOLHDL 2.9 07/13/2016   Assessment/Plan 1. Benign hypertension with CKD (chronic kidney disease) stage III B/p stable.continue on Amlodipine 5 mg daily.Monitor BMP    2. Chronic diastolic  heart failure  Stable. No abrupt weight changes. Exam findings negative for edema, shortness of breath or wheezing. Continue to monitor weight.   3. Insulin dependent type 2 diabetes mellitus, uncontrolled Brittle diabetic. CBG's ranging in the 70's-upper 300's with occasional 40's and 50's. Discussed with Dr. Glade LloydPandey recommend Humalog 70/30 5 units twice daily and Humalog per SSI 1 unit -5 units.Fo CBG's < 150 = 0 units; 151-200=1 unit; 201-250=2 units; 251-300 =3 units; 301-350 = 4 units; 351-450 = 5 units; > 450 units call provider. Check CBG's 1/2 half an hour before meals and at bedtime.Refer to Endocrinologist for evaluation brittle diabetic.   4. Hypothyroidism, unspecified type Recent TSH > 50 Levothyroxine  Increased to 112 mcg Tablet daily before breakfast. Refer to Endocrinologist for evaluation.  5. CKD (chronic kidney disease) stage 3, GFR 30-59 ml/min Monitor BMP   6. Mixed hyperlipidemia Continue on Lipitor 20 mg Tablet. Monitor Lipid panel.     Family/ staff Communication: Reviewed plan of care with patient, Dr. Glade LloydPandey and facility Nurse supervisor.   Labs/tests ordered: None

## 2016-11-29 NOTE — Progress Notes (Addendum)
PT evaluation addendum Late entry 10/17/16 Visit diagnosis    10/17/16 1100  PT Assessment  PT Recommendation/Assessment Patient needs continued PT services (visit diagnosis:  repeated falls)  PT Problem List Decreased strength;Decreased activity tolerance;Decreased balance;Decreased mobility;Decreased coordination;Decreased knowledge of use of DME  11/29/2016 Corlis HoveMargie Rashanda Magloire, PT [phone]

## 2016-12-01 NOTE — Progress Notes (Signed)
OT Note Addendum - Diagnosis    10/18/16 1649  OT Assessment  OT Problem List Other (comment) (low vision both eyes)  [facility] Emmary Culbreath, OT/L  [phone] 12/01/2016

## 2016-12-02 ENCOUNTER — Emergency Department: Payer: Medicare Other

## 2016-12-02 ENCOUNTER — Observation Stay
Admission: EM | Admit: 2016-12-02 | Discharge: 2016-12-04 | Disposition: A | Discharge status: Skilled Nursing Facility | Payer: Medicare Other | Attending: Internal Medicine | Admitting: Internal Medicine

## 2016-12-02 DIAGNOSIS — Z79899 Other long term (current) drug therapy: Secondary | ICD-10-CM | POA: Diagnosis not present

## 2016-12-02 DIAGNOSIS — N179 Acute kidney failure, unspecified: Secondary | ICD-10-CM | POA: Insufficient documentation

## 2016-12-02 DIAGNOSIS — Z794 Long term (current) use of insulin: Secondary | ICD-10-CM | POA: Diagnosis not present

## 2016-12-02 DIAGNOSIS — E039 Hypothyroidism, unspecified: Secondary | ICD-10-CM | POA: Insufficient documentation

## 2016-12-02 DIAGNOSIS — N189 Chronic kidney disease, unspecified: Secondary | ICD-10-CM

## 2016-12-02 DIAGNOSIS — F419 Anxiety disorder, unspecified: Secondary | ICD-10-CM | POA: Diagnosis present

## 2016-12-02 DIAGNOSIS — E1122 Type 2 diabetes mellitus with diabetic chronic kidney disease: Secondary | ICD-10-CM | POA: Diagnosis not present

## 2016-12-02 DIAGNOSIS — E162 Hypoglycemia, unspecified: Secondary | ICD-10-CM | POA: Diagnosis present

## 2016-12-02 DIAGNOSIS — Z6822 Body mass index (BMI) 22.0-22.9, adult: Secondary | ICD-10-CM | POA: Diagnosis not present

## 2016-12-02 DIAGNOSIS — K219 Gastro-esophageal reflux disease without esophagitis: Secondary | ICD-10-CM | POA: Insufficient documentation

## 2016-12-02 DIAGNOSIS — J449 Chronic obstructive pulmonary disease, unspecified: Secondary | ICD-10-CM | POA: Insufficient documentation

## 2016-12-02 DIAGNOSIS — E1165 Type 2 diabetes mellitus with hyperglycemia: Secondary | ICD-10-CM | POA: Diagnosis not present

## 2016-12-02 DIAGNOSIS — Z87891 Personal history of nicotine dependence: Secondary | ICD-10-CM | POA: Insufficient documentation

## 2016-12-02 DIAGNOSIS — IMO0002 Reserved for concepts with insufficient information to code with codable children: Secondary | ICD-10-CM

## 2016-12-02 DIAGNOSIS — I5032 Chronic diastolic (congestive) heart failure: Secondary | ICD-10-CM | POA: Diagnosis not present

## 2016-12-02 DIAGNOSIS — F329 Major depressive disorder, single episode, unspecified: Secondary | ICD-10-CM | POA: Diagnosis not present

## 2016-12-02 DIAGNOSIS — Z8673 Personal history of transient ischemic attack (TIA), and cerebral infarction without residual deficits: Secondary | ICD-10-CM | POA: Insufficient documentation

## 2016-12-02 DIAGNOSIS — E43 Unspecified severe protein-calorie malnutrition: Secondary | ICD-10-CM | POA: Insufficient documentation

## 2016-12-02 DIAGNOSIS — Z9181 History of falling: Secondary | ICD-10-CM | POA: Insufficient documentation

## 2016-12-02 DIAGNOSIS — I130 Hypertensive heart and chronic kidney disease with heart failure and stage 1 through stage 4 chronic kidney disease, or unspecified chronic kidney disease: Secondary | ICD-10-CM | POA: Diagnosis not present

## 2016-12-02 DIAGNOSIS — Z7902 Long term (current) use of antithrombotics/antiplatelets: Secondary | ICD-10-CM | POA: Diagnosis not present

## 2016-12-02 DIAGNOSIS — F411 Generalized anxiety disorder: Secondary | ICD-10-CM | POA: Insufficient documentation

## 2016-12-02 DIAGNOSIS — E11649 Type 2 diabetes mellitus with hypoglycemia without coma: Secondary | ICD-10-CM | POA: Diagnosis not present

## 2016-12-02 DIAGNOSIS — Z7982 Long term (current) use of aspirin: Secondary | ICD-10-CM | POA: Insufficient documentation

## 2016-12-02 DIAGNOSIS — I5033 Acute on chronic diastolic (congestive) heart failure: Secondary | ICD-10-CM | POA: Diagnosis present

## 2016-12-02 DIAGNOSIS — E7800 Pure hypercholesterolemia, unspecified: Secondary | ICD-10-CM | POA: Diagnosis not present

## 2016-12-02 DIAGNOSIS — M6281 Muscle weakness (generalized): Secondary | ICD-10-CM

## 2016-12-02 DIAGNOSIS — N183 Chronic kidney disease, stage 3 (moderate): Secondary | ICD-10-CM | POA: Insufficient documentation

## 2016-12-02 DIAGNOSIS — D638 Anemia in other chronic diseases classified elsewhere: Secondary | ICD-10-CM | POA: Diagnosis not present

## 2016-12-02 DIAGNOSIS — N390 Urinary tract infection, site not specified: Secondary | ICD-10-CM | POA: Insufficient documentation

## 2016-12-02 DIAGNOSIS — H8110 Benign paroxysmal vertigo, unspecified ear: Secondary | ICD-10-CM | POA: Insufficient documentation

## 2016-12-02 DIAGNOSIS — F32A Depression, unspecified: Secondary | ICD-10-CM | POA: Diagnosis present

## 2016-12-02 LAB — CBC WITH DIFFERENTIAL/PLATELET
BASOS ABS: 0 10*3/uL (ref 0–0.1)
Basophils Relative: 0 %
EOS PCT: 3 %
Eosinophils Absolute: 0.3 10*3/uL (ref 0–0.7)
HCT: 26.5 % — ABNORMAL LOW (ref 35.0–47.0)
Hemoglobin: 8.9 g/dL — ABNORMAL LOW (ref 12.0–16.0)
LYMPHS PCT: 19 %
Lymphs Abs: 2.4 10*3/uL (ref 1.0–3.6)
MCH: 30.6 pg (ref 26.0–34.0)
MCHC: 33.4 g/dL (ref 32.0–36.0)
MCV: 91.8 fL (ref 80.0–100.0)
MONO ABS: 0.8 10*3/uL (ref 0.2–0.9)
Monocytes Relative: 7 %
Neutro Abs: 8.7 10*3/uL — ABNORMAL HIGH (ref 1.4–6.5)
Neutrophils Relative %: 71 %
PLATELETS: 290 10*3/uL (ref 150–440)
RBC: 2.89 MIL/uL — ABNORMAL LOW (ref 3.80–5.20)
RDW: 14.7 % — AB (ref 11.5–14.5)
WBC: 12.3 10*3/uL — ABNORMAL HIGH (ref 3.6–11.0)

## 2016-12-02 LAB — URINALYSIS, COMPLETE (UACMP) WITH MICROSCOPIC
Bilirubin Urine: NEGATIVE
GLUCOSE, UA: 50 mg/dL — AB
Hgb urine dipstick: NEGATIVE
Ketones, ur: NEGATIVE mg/dL
Nitrite: NEGATIVE
PH: 7 (ref 5.0–8.0)
Protein, ur: >=300 mg/dL — AB
SPECIFIC GRAVITY, URINE: 1.017 (ref 1.005–1.030)

## 2016-12-02 LAB — GLUCOSE, CAPILLARY
Glucose-Capillary: 58 mg/dL — ABNORMAL LOW (ref 65–99)
Glucose-Capillary: 125 mg/dL — ABNORMAL HIGH (ref 65–99)

## 2016-12-02 LAB — LIPASE, BLOOD: LIPASE: 18 U/L (ref 11–51)

## 2016-12-02 LAB — COMPREHENSIVE METABOLIC PANEL
ALK PHOS: 82 U/L (ref 38–126)
ALT: 15 U/L (ref 14–54)
AST: 24 U/L (ref 15–41)
Albumin: 1.8 g/dL — ABNORMAL LOW (ref 3.5–5.0)
Anion gap: 6 (ref 5–15)
BILIRUBIN TOTAL: 0.6 mg/dL (ref 0.3–1.2)
BUN: 33 mg/dL — ABNORMAL HIGH (ref 6–20)
CALCIUM: 7.9 mg/dL — AB (ref 8.9–10.3)
CO2: 24 mmol/L (ref 22–32)
CREATININE: 1.82 mg/dL — AB (ref 0.44–1.00)
Chloride: 109 mmol/L (ref 101–111)
GFR, EST AFRICAN AMERICAN: 32 mL/min — AB (ref >60)
GFR, EST NON AFRICAN AMERICAN: 27 mL/min — AB (ref >60)
Glucose, Bld: 56 mg/dL — ABNORMAL LOW (ref 65–99)
Potassium: 3.6 mmol/L (ref 3.5–5.1)
Sodium: 139 mmol/L (ref 135–145)
Total Protein: 4.6 g/dL — ABNORMAL LOW (ref 6.5–8.1)

## 2016-12-02 LAB — TROPONIN I

## 2016-12-02 MED ORDER — ONDANSETRON 4 MG PO TBDP: 4.0000 mg | ORAL_TABLET | Freq: Three times a day (TID) | ORAL | 0 refills | Status: DC | PRN

## 2016-12-02 MED ORDER — CEPHALEXIN 500 MG PO CAPS: 500.0000 mg | ORAL_CAPSULE | Freq: Two times a day (BID) | ORAL | 0 refills | Status: DC

## 2016-12-02 MED ORDER — DEXTROSE 5 % IV SOLN: 1.0000 g | Freq: Once | INTRAVENOUS | Status: DC

## 2016-12-02 MED ORDER — CEPHALEXIN 250 MG/5ML PO SUSR: 500.0000 mg | Freq: Two times a day (BID) | ORAL | 0 refills | Status: DC

## 2016-12-02 MED ORDER — CEFTRIAXONE SODIUM-DEXTROSE 1-3.74 GM-% IV SOLR
1.0000 g | Freq: Once | INTRAVENOUS | Status: AC
  Administered 2016-12-02: 1 g via INTRAVENOUS

## 2016-12-02 MED ORDER — CEFTRIAXONE SODIUM-DEXTROSE 1-3.74 GM-% IV SOLR
INTRAVENOUS | Status: AC
  Administered 2016-12-02: 1 g via INTRAVENOUS
  Filled 2016-12-02: qty 50

## 2016-12-02 MED ORDER — DEXTROSE 5 % IV BOLUS
500.0000 mL | Freq: Once | INTRAVENOUS | Status: AC
  Administered 2016-12-02: 500 mL via INTRAVENOUS
  Filled 2016-12-02: qty 500

## 2016-12-02 NOTE — ED Provider Notes (Signed)
[facility] Emergency Department Provider Note  ____________________________________________  Time seen: Approximately 11:08 PM  I have reviewed the triage vital signs and the nursing notes.   HISTORY  Chief Complaint Nausea and Hypoglycemia Level 5 caveat:  Portions of the history and physical were unable to be obtained due to the patient's poor historian    HPI Cassandra AbbeBillie A [name] is a 70 y.o. female sent to the ED due to an episode of hypoglycemia at her nursing home. This was resolved with oral glucose, but family requested evaluation due to possible failure to thrive and decreased oral intake over the last few days. No fever or focal pain. No other focal symptoms.     Past Medical History:  Diagnosis Date  . Allergic rhinitis   . Anemia   . Anxiety   . Arthritis    "mostly feet, hands" (10/17/2016)  . Benign hypertension with CKD (chronic kidney disease) stage III   . Benign paroxysmal positional vertigo   . Bipolar disorder (HCC)   . Broken finger   . Broken shoulder   . Broken toes   . Cervicalgia   . CHF (congestive heart failure) (HCC) 07/2016  . CKD (chronic kidney disease) stage 3, GFR 30-59 ml/min   . COPD (chronic obstructive pulmonary disease) (HCC)   . Depression   . Elevated liver enzymes Hep B/C neg 2014  . Fall at nursing home 10/15/2016  . GERD (gastroesophageal reflux disease)   . Glaucoma   . High cholesterol   . History of alcohol use   . History of blood transfusion 09/2016   "blood got really really low"  . Hypertension   . Hypothyroidism   . Interstitial cystitis    bladder stretched every 9 months  . Migraines    "pretty much qd" (10/17/2016)  . Psoriasis   . Stroke (HCC) 05/16/2016   Left occipital and thalamic, right hippocampal  . Thyroid disease   . Tobacco use   . 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 Vitamin B12 deficiency      Patient Active Problem List   Diagnosis Date Noted  .  Palliative care encounter   . Goals of care, counseling/discussion   . Cervical vertebral fracture (HCC) 10/17/2016  . Thoracic vertebral fracture (HCC) 10/17/2016  . Fall at nursing home 10/17/2016  . Common bile duct dilatation 10/17/2016  . Closed fracture of cervical vertebra (HCC)   . Recent Gram-negative bacteremia 10/07/2016  . Gastroesophageal reflux disease   . DKA, type 1 (HCC) 09/06/2016  . Protein-calorie malnutrition, severe 08/28/2016  . Chronic diastolic heart failure (HCC) 07/19/2016  . Dehydration   . Cerebral embolism with cerebral infarction 07/13/2016  . Left ventricular diastolic dysfunction, NYHA class 1 07/11/2016  . History of CVA (cerebrovascular accident)   . HLD (hyperlipidemia)   . Anemia of chronic disease   . Physical deconditioning   . Diabetic ketoacidosis with coma associated with type 1 diabetes mellitus (HCC)   . Cerebral thrombosis with cerebral infarction 05/15/2016  . Generalized anxiety disorder 05/10/2016  . Diabetic ketoacidosis without coma associated with type 1 diabetes mellitus (HCC) 05/06/2016  . Insulin dependent type 2 diabetes mellitus, uncontrolled (HCC)   . Depression   . CKD (chronic kidney disease) stage 3, GFR 30-59 ml/min   . Uncontrolled Hypothyroidism   . Vitamin B12 deficiency   . Benign paroxysmal positional vertigo   . Anxiety   . Allergic rhinitis   . Glaucoma   .  Benign hypertension with CKD (chronic kidney disease) stage III   . Tobacco abuse   . Cervicalgia   . Elevated liver enzymes   . History of alcohol use      Past Surgical History:  Procedure Laterality Date  . ANTERIOR CERVICAL DECOMP/DISCECTOMY FUSION  ~ 2009  . APPENDECTOMY    . BACK SURGERY    . BLADDER SUSPENSION    . CATARACT EXTRACTION W/ INTRAOCULAR LENS  IMPLANT, BILATERAL Bilateral   . DILATION AND CURETTAGE OF UTERUS    . HERNIA REPAIR    . LAPAROSCOPIC CHOLECYSTECTOMY    . TONSILLECTOMY    . TUBAL LIGATION    . VAGINAL HYSTERECTOMY      with oophorectomy     Prior to Admission medications   Medication Sig Start Date End Date Taking? Authorizing Provider  acetaminophen (TYLENOL) 325 MG tablet Take 650 mg by mouth every 6 (six) hours as needed for mild pain or fever.     Historical Provider, MD  Amino Acids-Protein Hydrolys (FEEDING SUPPLEMENT, PRO-STAT SUGAR FREE 64,) LIQD Take 30 mLs by mouth 2 (two) times daily.    Historical Provider, MD  amLODipine (NORVASC) 5 MG tablet Take 5 mg by mouth daily.    Historical Provider, MD  aspirin 81 MG chewable tablet Chew 1 tablet (81 mg total) by mouth daily. 09/24/16   Vassie Loll, MD  atorvastatin (LIPITOR) 10 MG tablet Take 1 tablet (10 mg total) by mouth daily at 6 PM. Patient taking differently: Take 20 mg by mouth daily at 6 PM.  05/19/16   Rolly Salter, MD  carvedilol (COREG) 3.125 MG tablet Take 1 tablet (3.125 mg total) by mouth 2 (two) times daily with a meal. 07/18/16   Mauricio Annett Gula, MD  cephALEXin (KEFLEX) 500 MG capsule Take 1 capsule (500 mg total) by mouth 2 (two) times daily. 12/02/16   Sharman Cheek, MD  clopidogrel (PLAVIX) 75 MG tablet Take 75 mg by mouth daily.    Historical Provider, MD  escitalopram (LEXAPRO) 20 MG tablet Take 20 mg by mouth at bedtime.     Historical Provider, MD  famotidine (PEPCID) 20 MG tablet Take 1 tablet (20 mg total) by mouth daily. 10/22/16   Osvaldo Shipper, MD  insulin lispro (HUMALOG) 100 UNIT/ML injection Inject 0.01-0.05 mLs (1-5 Units total) into the skin 3 (three) times daily before meals. For CBGs < 150 = 0 units; 151 - 200 = 1 units, 201 - 250 = 2 units, 251 - 300 = 3 units; 301-350 = 4 units; 351-400 = 5 units; > 400 notify the provider. 11/21/16   Dinah C Ngetich, NP  iron polysaccharides (NIFEREX) 150 MG capsule Take 1 capsule (150 mg total) by mouth 2 (two) times daily. 06/28/16   Vassie Loll, MD  meclizine (ANTIVERT) 25 MG tablet Take 1 tablet (25 mg total) by mouth 3 (three) times daily as needed for dizziness.  02/12/16   Chilton Si, MD  NUTRITIONAL SUPPLEMENT LIQD Give Med Pass 2.0  Sugar free 120 mls by mouth three times daily between meals as a feeding supplement.    Historical Provider, MD  nystatin-triamcinolone ointment (MYCOLOG) Apply ointment every shift to perianal / buttocks for rash    Historical Provider, MD  ondansetron (ZOFRAN ODT) 4 MG disintegrating tablet Take 1 tablet (4 mg total) by mouth every 8 (eight) hours as needed for nausea or vomiting. 12/02/16   Sharman Cheek, MD  SYNTHROID 100 MCG tablet Take 1 tablet (100 mcg total)  by mouth daily before breakfast. Take along with 25 mcg take 1/2 tablet daily for a total of 112 mcg Tablet. Use only brand name medication. 11/19/16   Dinah C Ngetich, NP  traMADol (ULTRAM) 50 MG tablet Take 1 tablet (50 mg total) by mouth every 6 (six) hours as needed. 10/22/16   Osvaldo Shipper, MD     Allergies Alprazolam; Percocet [oxycodone-acetaminophen]; Codeine; Doxycycline; Hydrocodone; Omnicef [cefdinir]; Augmentin [amoxicillin-pot clavulanate]; and Ciprofloxacin   Family History  Problem Relation Age of Onset  . Alcohol abuse Mother   . Arthritis Mother   . Asthma Mother   . Cancer Mother     colon cancer  . Hypertension Mother   . Migraines Mother   . Stroke Mother   . Lung disease Mother   . COPD Mother   . Diabetes Father   . Hypertension Father   . Heart disease Father   . Heart attack Father   . Heart disease Paternal Grandmother   . Diabetes Paternal Grandmother   . Stroke Paternal Grandmother   . Cancer Paternal Grandmother   . Diabetes Paternal Grandfather     Social History Social History  Substance Use Topics  . Smoking status: Former Smoker    Packs/day: 0.25    Years: 10.00    Types: Cigarettes    Quit date: 12/1923  . Smokeless tobacco: Never Used  . Alcohol use No     Comment: Hasn't had any alcohol "for 2 yrs" (10/17/2016)    Review of Systems  Constitutional:   No fever  ENT:   No sore throat. No  rhinorrhea. Cardiovascular:   No chest pain. Respiratory:   No dyspnea or cough. Gastrointestinal:   Negative for abdominal pain, vomiting and diarrhea.  Genitourinary:   Negative for dysuria or difficulty urinating. Musculoskeletal:   Negative for focal pain or swelling Neurological:   Negative for headaches 10-point ROS otherwise negative.  ____________________________________________   PHYSICAL EXAM:  VITAL SIGNS: ED Triage Vitals  Enc Vitals Group     BP 12/02/16 1940 130/62     Pulse Rate 12/02/16 1940 70     Resp 12/02/16 1940 15     Temp 12/02/16 1940 97.8 F (36.6 C)     Temp Source 12/02/16 1940 Oral     SpO2 12/02/16 1940 95 %     Weight 12/02/16 1940 150 lb (68 kg)     Height 12/02/16 1940 5\' 3"  (1.6 m)     Head Circumference --      Peak Flow --      Pain Score 12/02/16 1941 0     Pain Loc --      Pain Edu? --      Excl. in GC? --     Vital signs reviewed, nursing assessments reviewed.   Constitutional:   Alert and orientedTo person and place. Well appearing and in no distress. Eyes:   No scleral icterus. No conjunctival pallor. PERRL. EOMI.  No nystagmus. ENT   Head:   Normocephalic and atraumatic.   Nose:   No congestion/rhinnorhea. No septal hematoma   Mouth/Throat:   Dry mucous membranes, no pharyngeal erythema. No peritonsillar mass.    Neck:   No stridor. No SubQ emphysema. No meningismus. Hematological/Lymphatic/Immunilogical:   No cervical lymphadenopathy. Cardiovascular:   RRR. Symmetric bilateral radial and DP pulses.  No murmurs.  Respiratory:   Normal respiratory effort without tachypnea nor retractions. Breath sounds are clear and equal bilaterally. No wheezes/rales/rhonchi. Gastrointestinal:   Soft and  nontender. Non distended. There is no CVA tenderness.  No rebound, rigidity, or guarding. Genitourinary:   deferred Musculoskeletal:   Nontender with normal range of motion in all extremities. No joint effusions.  No lower extremity  tenderness.  No edema. Neurologic:   Limited speech.  CN 2-10 normal. Motor grossly intact. No gross focal neurologic deficits are appreciated.  Skin:    Skin is warm, dry and intact. No rash noted.  No petechiae, purpura, or bullae. Poor skin turgor  ____________________________________________    LABS (pertinent positives/negatives) (all labs ordered are listed, but only abnormal results are displayed) Labs Reviewed  GLUCOSE, CAPILLARY - Abnormal; Notable for the following:       Result Value   Glucose-Capillary 58 (*)    All other components within normal limits  COMPREHENSIVE METABOLIC PANEL - Abnormal; Notable for the following:    Glucose, Bld 56 (*)    BUN 33 (*)    Creatinine, Ser 1.82 (*)    Calcium 7.9 (*)    Total Protein 4.6 (*)    Albumin 1.8 (*)    GFR calc non Af Amer 27 (*)    GFR calc Af Amer 32 (*)    All other components within normal limits  CBC WITH DIFFERENTIAL/PLATELET - Abnormal; Notable for the following:    WBC 12.3 (*)    RBC 2.89 (*)    Hemoglobin 8.9 (*)    HCT 26.5 (*)    RDW 14.7 (*)    Neutro Abs 8.7 (*)    All other components within normal limits  URINALYSIS, COMPLETE (UACMP) WITH MICROSCOPIC - Abnormal; Notable for the following:    Color, Urine YELLOW (*)    APPearance HAZY (*)    Glucose, UA 50 (*)    Protein, ur >=300 (*)    Leukocytes, UA SMALL (*)    Bacteria, UA RARE (*)    Squamous Epithelial / LPF 0-5 (*)    All other components within normal limits  GLUCOSE, CAPILLARY - Abnormal; Notable for the following:    Glucose-Capillary 125 (*)    All other components within normal limits  URINE CULTURE  LIPASE, BLOOD  TROPONIN I   ____________________________________________   EKG    ____________________________________________    RADIOLOGY  Chest x-ray unremarkable  ____________________________________________   PROCEDURES Procedures  ____________________________________________   INITIAL IMPRESSION /  ASSESSMENT AND PLAN / ED COURSE  Pertinent labs & imaging results that were available during my care of the patient were reviewed by me and considered in my medical decision making (see chart for details).  Patient presents with a brief episode of hypoglycemia which is now essentially resolved. No focal symptoms or exam findings except for clinically mildly dehydrated. Bolus of D5 given for hydration and correction of hypoglycemia. In addition, workup is significant for urinary tract infection. Urine culture sent. IV ceftriaxone here, started on Keflex orally. Creatinine of 1.8 is only slightly increased from her baseline of 1.4. Presentation is not consistent with pyelonephritis or sepsis at this time. After administration of fluids and antibiotic patient should be suitable for discharge home to her nursing facility. Follow up with primary care.     Clinical Course    ____________________________________________   FINAL CLINICAL IMPRESSION(S) / ED DIAGNOSES  Final diagnoses:  Lower urinary tract infectious disease      New Prescriptions   CEPHALEXIN (KEFLEX) 500 MG CAPSULE    Take 1 capsule (500 mg total) by mouth 2 (two) times daily.   ONDANSETRON [facility]  ODT) 4 MG DISINTEGRATING TABLET    Take 1 tablet (4 mg total) by mouth every 8 (eight) hours as needed for nausea or vomiting.     Portions of this note were generated with dragon dictation software. Dictation errors may occur despite best attempts at proofreading.    Sharman CheekPhillip Lanaiya Lantry, MD 12/02/16 [id number]

## 2016-12-02 NOTE — ED Notes (Signed)
Pt arrived via ems for c/o hypoglycemia and nausea - reported from nursing home staff that pt has had N/V today and not eating as well as normal - reported to be having decreased conversation with staff - tonight CBG at nursing home 30 and they gave 2 tubes of oral glucose - ems reports recheck CBG 75 - family requested that pt be seen for evaluation and ? Failure to thrive - she has a c-collar in place for known cervical fracture Nov 2017 - pt is oriented to person and place but not to date and time

## 2016-12-02 NOTE — ED Triage Notes (Signed)
Pt arrived via ems for c/o hypoglycemia and nausea - reported from nursing home staff that pt has had N/V today and not eating as well as normal - reported to be having decreased conversation with staff - tonight CBG at nursing home 30 and they gave 2 tubes of oral glucose - ems reports recheck CBG 75 - family requested that pt be seen for evaluation and ? Failure to thrive - she has a c-collar in place for known cervical fracture Nov 2017

## 2016-12-02 NOTE — ED Notes (Signed)
In and out cath for urine sample [facility] pt tolerated well

## 2016-12-03 DIAGNOSIS — N390 Urinary tract infection, site not specified: Secondary | ICD-10-CM | POA: Diagnosis present

## 2016-12-03 DIAGNOSIS — E162 Hypoglycemia, unspecified: Secondary | ICD-10-CM | POA: Diagnosis present

## 2016-12-03 LAB — BASIC METABOLIC PANEL
ANION GAP: 10 (ref 5–15)
BUN: 31 mg/dL — AB (ref 6–20)
CALCIUM: 7.9 mg/dL — AB (ref 8.9–10.3)
CO2: 22 mmol/L (ref 22–32)
Chloride: 102 mmol/L (ref 101–111)
Creatinine, Ser: 1.65 mg/dL — ABNORMAL HIGH (ref 0.44–1.00)
GFR calc Af Amer: 36 mL/min — ABNORMAL LOW (ref >60)
GFR, EST NON AFRICAN AMERICAN: 31 mL/min — AB (ref >60)
GLUCOSE: 343 mg/dL — AB (ref 65–99)
POTASSIUM: 3.6 mmol/L (ref 3.5–5.1)
SODIUM: 134 mmol/L — AB (ref 135–145)

## 2016-12-03 LAB — GLUCOSE, CAPILLARY
GLUCOSE-CAPILLARY: 158 mg/dL — AB (ref 65–99)
GLUCOSE-CAPILLARY: 447 mg/dL — AB (ref 65–99)
GLUCOSE-CAPILLARY: 500 mg/dL — AB (ref 65–99)
Glucose-Capillary: 144 mg/dL — ABNORMAL HIGH (ref 65–99)
Glucose-Capillary: 171 mg/dL — ABNORMAL HIGH (ref 65–99)

## 2016-12-03 LAB — CBC
HEMATOCRIT: 31.4 % — AB (ref 35.0–47.0)
HEMOGLOBIN: 10.6 g/dL — AB (ref 12.0–16.0)
MCH: 31 pg (ref 26.0–34.0)
MCHC: 33.8 g/dL (ref 32.0–36.0)
MCV: 91.7 fL (ref 80.0–100.0)
Platelets: 346 10*3/uL (ref 150–440)
RBC: 3.42 MIL/uL — ABNORMAL LOW (ref 3.80–5.20)
RDW: 15.2 % — AB (ref 11.5–14.5)
WBC: 12 10*3/uL — AB (ref 3.6–11.0)

## 2016-12-03 LAB — T4, FREE: Free T4: 0.74 ng/dL (ref 0.61–1.12)

## 2016-12-03 LAB — TSH: TSH: 180 u[IU]/mL — ABNORMAL HIGH (ref 0.350–4.500)

## 2016-12-03 MED ORDER — CLOPIDOGREL BISULFATE 75 MG PO TABS
75.0000 mg | ORAL_TABLET | Freq: Every day | ORAL | Status: DC
  Administered 2016-12-03 – 2016-12-04 (×2): 75 mg via ORAL
  Filled 2016-12-03 (×2): qty 1

## 2016-12-03 MED ORDER — LORAZEPAM 2 MG/ML IJ SOLN
INTRAMUSCULAR | Status: AC
  Administered 2016-12-03: 0.5 mg via INTRAVENOUS
  Filled 2016-12-03: qty 1

## 2016-12-03 MED ORDER — INSULIN GLARGINE 100 UNIT/ML ~~LOC~~ SOLN
4.0000 [IU] | Freq: Every day | SUBCUTANEOUS | Status: DC
  Administered 2016-12-03 – 2016-12-04 (×2): 4 [IU] via SUBCUTANEOUS
  Filled 2016-12-03 (×2): qty 0.04

## 2016-12-03 MED ORDER — AMLODIPINE BESYLATE 5 MG PO TABS
5.0000 mg | ORAL_TABLET | Freq: Every day | ORAL | Status: DC
  Administered 2016-12-03 – 2016-12-04 (×2): 5 mg via ORAL
  Filled 2016-12-03 (×2): qty 1

## 2016-12-03 MED ORDER — ATORVASTATIN CALCIUM 20 MG PO TABS
20.0000 mg | ORAL_TABLET | Freq: Every day | ORAL | Status: DC
  Administered 2016-12-03: 20 mg via ORAL
  Filled 2016-12-03: qty 1

## 2016-12-03 MED ORDER — ONDANSETRON HCL 4 MG PO TABS
4.0000 mg | ORAL_TABLET | Freq: Four times a day (QID) | ORAL | Status: DC | PRN
Start: 2016-12-03 — End: 2016-12-04

## 2016-12-03 MED ORDER — ACETAMINOPHEN 325 MG PO TABS: 650.0000 mg | ORAL_TABLET | Freq: Four times a day (QID) | ORAL | Status: DC | PRN

## 2016-12-03 MED ORDER — CEFTRIAXONE SODIUM-DEXTROSE 1-3.74 GM-% IV SOLR
1.0000 g | INTRAVENOUS | Status: DC
  Administered 2016-12-03: 1 g via INTRAVENOUS
  Filled 2016-12-03 (×2): qty 50

## 2016-12-03 MED ORDER — ASPIRIN 81 MG PO CHEW
81.0000 mg | CHEWABLE_TABLET | Freq: Every day | ORAL | Status: DC
  Administered 2016-12-03 – 2016-12-04 (×2): 81 mg via ORAL
  Filled 2016-12-03 (×2): qty 1

## 2016-12-03 MED ORDER — INSULIN ASPART 100 UNIT/ML ~~LOC~~ SOLN: 0.0000 [IU] | Freq: Every day | SUBCUTANEOUS | Status: DC

## 2016-12-03 MED ORDER — LORAZEPAM 2 MG/ML IJ SOLN
0.5000 mg | Freq: Once | INTRAMUSCULAR | Status: AC
Start: 2016-12-03 — End: 2016-12-03
  Administered 2016-12-03: 0.5 mg via INTRAVENOUS

## 2016-12-03 MED ORDER — INSULIN ASPART 100 UNIT/ML ~~LOC~~ SOLN
5.0000 [IU] | Freq: Once | SUBCUTANEOUS | Status: AC
  Administered 2016-12-03: 5 [IU] via SUBCUTANEOUS
  Filled 2016-12-03: qty 5

## 2016-12-03 MED ORDER — LEVOTHYROXINE SODIUM 112 MCG PO TABS
112.0000 ug | ORAL_TABLET | Freq: Every day | ORAL | Status: DC
  Administered 2016-12-03 – 2016-12-04 (×2): 112 ug via ORAL
  Filled 2016-12-03: qty 1

## 2016-12-03 MED ORDER — INSULIN ASPART 100 UNIT/ML ~~LOC~~ SOLN
0.0000 [IU] | Freq: Three times a day (TID) | SUBCUTANEOUS | Status: DC
  Administered 2016-12-03 – 2016-12-04 (×3): 3 [IU] via SUBCUTANEOUS
  Filled 2016-12-03 (×2): qty 3

## 2016-12-03 MED ORDER — CARVEDILOL 3.125 MG PO TABS
3.1250 mg | ORAL_TABLET | Freq: Two times a day (BID) | ORAL | Status: DC
  Administered 2016-12-03 – 2016-12-04 (×3): 3.125 mg via ORAL
  Filled 2016-12-03 (×3): qty 1

## 2016-12-03 MED ORDER — HEPARIN SODIUM (PORCINE) 5000 UNIT/ML IJ SOLN
5000.0000 [IU] | Freq: Three times a day (TID) | INTRAMUSCULAR | Status: DC
  Administered 2016-12-03 – 2016-12-04 (×4): 5000 [IU] via SUBCUTANEOUS
  Filled 2016-12-03 (×5): qty 1

## 2016-12-03 MED ORDER — ONDANSETRON HCL 4 MG/2ML IJ SOLN: 4.0000 mg | Freq: Four times a day (QID) | INTRAMUSCULAR | Status: DC | PRN

## 2016-12-03 MED ORDER — INSULIN ASPART 100 UNIT/ML ~~LOC~~ SOLN
0.0000 [IU] | Freq: Three times a day (TID) | SUBCUTANEOUS | Status: DC
  Administered 2016-12-03: 9 [IU] via SUBCUTANEOUS
  Filled 2016-12-03: qty 9

## 2016-12-03 MED ORDER — FAMOTIDINE 20 MG PO TABS
20.0000 mg | ORAL_TABLET | Freq: Every day | ORAL | Status: DC
  Administered 2016-12-03 – 2016-12-04 (×2): 20 mg via ORAL
  Filled 2016-12-03 (×2): qty 1

## 2016-12-03 MED ORDER — DEXTROSE-NACL 5-0.45 % IV SOLN
INTRAVENOUS | Status: DC
  Administered 2016-12-03: 02:00:00 via INTRAVENOUS

## 2016-12-03 MED ORDER — ACETAMINOPHEN 650 MG RE SUPP: 650.0000 mg | Freq: Four times a day (QID) | RECTAL | Status: DC | PRN

## 2016-12-03 MED ORDER — ESCITALOPRAM OXALATE 10 MG PO TABS
20.0000 mg | ORAL_TABLET | Freq: Every day | ORAL | Status: DC
  Administered 2016-12-03: 20 mg via ORAL
  Filled 2016-12-03: qty 2

## 2016-12-03 NOTE — Progress Notes (Signed)
Inpatient Diabetes Program Recommendations  AACE/ADA: New Consensus Statement on Inpatient Glycemic Control (2015)  Target Ranges:  Prepandial:   less than 140 mg/dL      Peak postprandial:   less than 180 mg/dL (1-2 hours)      Critically ill patients:  140 - 180 mg/dL   Lab Results  Component Value Date   GLUCAP 158 (H) 12/03/2016   HGBA1C 7.5 11/18/2016    Review of Glycemic Control:  Results for Cassandra [name], Cassandra A (MRN [id number]) as of 12/03/2016 08:55  Ref. Range 12/03/2016 07:36  Glucose Latest Ref Range: 65 - 99 mg/dL 409343 (H)   Diabetes history:  Type 1 diabetes Outpatient Diabetes medications: Novolog 70/30 mix 5 units bid Current orders for Inpatient glycemic control:  None due to patient being admitted with hypoglycemia  Inpatient Diabetes Program Recommendations:    Note lab glucose>300 mg/dL.  May consider adding Lantus 4 units daily.  Also consider adding Novolog correction starting at 151 mg/dL.  Consider Novolog 151-200 mg/dL-1 unit, [id number] mg/dL- 2 units, 782-956251-300 mg/dL-3 units, 213-086301-350 mg/dL-4 units, 578-469351-400 mg/dL-5 units, >629>401 mg/dL- Give 6 units and call MD.   Will text page MD.   Thanks, Cassandra MeagerJenny Syrena Burges, RN, BC-ADM Inpatient Diabetes Coordinator Pager [phone] (8a-5p)

## 2016-12-03 NOTE — NC FL2 (Signed)
[place] MEDICAID FL2 LEVEL OF CARE SCREENING TOOL     IDENTIFICATION  Patient Name: Cassandra [name] Birthdate: 03/02/1946 Sex: female Admission Date (Current Location): 12/02/2016  [place]   Facility and Address:  [facility], [address], [place], [place] [address]      Provider Number: [id number]  Attending Physician Name and Address:  Wyatt Hasteavid K Hower, MD  Relative Name and Phone Number:       Current Level of Care: Hospital Recommended Level of Care: Skilled Nursing Facility Prior Approval Number:    Date Approved/Denied:   PASRR Number:  [id number] F (End date: 01/01/2017)   )  Discharge Plan: SNF    Current Diagnoses: Patient Active Problem List   Diagnosis Date Noted  . UTI (urinary tract infection) 12/03/2016  . Hypoglycemia 12/03/2016  . Palliative care encounter   . Goals of care, counseling/discussion   . Cervical vertebral fracture (HCC) 10/17/2016  . Thoracic vertebral fracture (HCC) 10/17/2016  . Fall at nursing home 10/17/2016  . Common bile duct dilatation 10/17/2016  . Closed fracture of cervical vertebra (HCC)   . Recent Gram-negative bacteremia 10/07/2016  . Gastroesophageal reflux disease   . DKA, type 1 (HCC) 09/06/2016  . Protein-calorie malnutrition, severe 08/28/2016  . Chronic diastolic heart failure (HCC) 07/19/2016  . Dehydration   . Cerebral embolism with cerebral infarction 07/13/2016  . Left ventricular diastolic dysfunction, NYHA class 1 07/11/2016  . History of CVA (cerebrovascular accident)   . HLD (hyperlipidemia)   . Anemia of chronic disease   . Physical deconditioning   . Acute on chronic kidney failure (HCC)   . Diabetic ketoacidosis with coma associated with type 1 diabetes mellitus (HCC)   . Cerebral thrombosis with cerebral infarction 05/15/2016  . Generalized anxiety disorder 05/10/2016  . Diabetic ketoacidosis without coma associated with type 1 diabetes  mellitus (HCC) 05/06/2016  . Insulin dependent type 2 diabetes mellitus, uncontrolled (HCC)   . Depression   . CKD (chronic kidney disease) stage 3, GFR 30-59 ml/min   . Uncontrolled Hypothyroidism   . Vitamin B12 deficiency   . Benign paroxysmal positional vertigo   . Anxiety   . Allergic rhinitis   . Glaucoma   . Benign hypertension with CKD (chronic kidney disease) stage III   . Tobacco abuse   . Cervicalgia   . Elevated liver enzymes   . History of alcohol use     Orientation RESPIRATION BLADDER Height & Weight     Self, Time, Place  Normal Incontinent Weight: 127 lb 3.2 oz (57.7 kg) Height:  5\' 3"  (160 cm)  BEHAVIORAL SYMPTOMS/MOOD NEUROLOGICAL BOWEL NUTRITION STATUS   (none)  (none) Incontinent Diet (Diet: Heart Healthy/ Carb Modified )  AMBULATORY STATUS COMMUNICATION OF NEEDS Skin   Extensive Assist Verbally Normal                       Personal Care Assistance Level of Assistance  Bathing, Feeding, Dressing Bathing Assistance: Limited assistance Feeding assistance: Independent Dressing Assistance: Limited assistance     Functional Limitations Info  Sight, Hearing, Speech Sight Info: Adequate Hearing Info: Adequate Speech Info: Adequate    SPECIAL CARE FACTORS FREQUENCY  PT (By licensed PT), OT (By licensed OT)     PT Frequency:  (5) OT Frequency:  (5)            Contractures      Additional Factors Info  Code Status, Allergies, Insulin  Sliding Scale Code Status Info:  (Full Code. ) Allergies Info:  (Alprazolam, Percocet Oxycodone-acetaminophen, Codeine, Doxycycline, Hydrocodone, Omnicef Cefdinir, Augmentin Amoxicillin-pot, Clavulanate, Ciprofloxacin)   Insulin Sliding Scale Info:  (NovoLog Insulin Injections. )       Current Medications (12/03/2016):  This is the current hospital active medication list Current Facility-Administered Medications  Medication Dose Route Frequency Provider Last Rate Last Dose  . acetaminophen (TYLENOL) tablet  650 mg  650 mg Oral Q6H PRN Oralia Manisavid Willis, MD       Or  . acetaminophen (TYLENOL) suppository 650 mg  650 mg Rectal Q6H PRN Oralia Manisavid Willis, MD      . amLODipine (NORVASC) tablet 5 mg  5 mg Oral Daily Oralia Manisavid Willis, MD   5 mg at 12/03/16 [phone]  . aspirin chewable tablet 81 mg  81 mg Oral Daily Oralia Manisavid Willis, MD   81 mg at 12/03/16 [id number]  . atorvastatin (LIPITOR) tablet 20 mg  20 mg Oral q1800 Oralia Manisavid Willis, MD      . carvedilol (COREG) tablet 3.125 mg  3.125 mg Oral BID WC Oralia Manisavid Willis, MD   3.125 mg at 12/03/16 [id number]  . cefTRIAXone (ROCEPHIN) IVPB 1 g  1 g Intravenous Q24H Oralia Manisavid Willis, MD      . clopidogrel (PLAVIX) tablet 75 mg  75 mg Oral Daily Oralia Manisavid Willis, MD   75 mg at 12/03/16 [phone]  . escitalopram (LEXAPRO) tablet 20 mg  20 mg Oral QHS Oralia Manisavid Willis, MD      . famotidine (PEPCID) tablet 20 mg  20 mg Oral Daily Oralia Manisavid Willis, MD   20 mg at 12/03/16 [id number]  . heparin injection 5,000 Units  5,000 Units Subcutaneous Q8H Oralia Manisavid Willis, MD      . insulin aspart (novoLOG) injection 0-5 Units  0-5 Units Subcutaneous QHS Wyatt Hasteavid K Hower, MD      . insulin aspart (novoLOG) injection 0-9 Units  0-9 Units Subcutaneous TID WC Wyatt Hasteavid K Hower, MD      . insulin glargine (LANTUS) injection 4 Units  4 Units Subcutaneous Daily Wyatt Hasteavid K Hower, MD      . levothyroxine (SYNTHROID, LEVOTHROID) tablet 112 mcg  112 mcg Oral QAC breakfast Oralia Manisavid Willis, MD      . ondansetron [facility](ZOFRAN) tablet 4 mg  4 mg Oral Q6H PRN Oralia Manisavid Willis, MD       Or  . ondansetron [facility](ZOFRAN) injection 4 mg  4 mg Intravenous Q6H PRN Oralia Manisavid Willis, MD         Discharge Medications: Please see discharge summary for a list of discharge medications.  Relevant Imaging Results:  Relevant Lab Results:   Additional Information  (SSN: [id number])  Zamyia Gowell, Darleen CrockerBailey M, LCSW

## 2016-12-03 NOTE — Progress Notes (Signed)
[facility] [place] at [facility]   PATIENT NAME: Cassandra [name] Bujak    MRN#:  [id number]  DATE OF BIRTH:  [birth year]  SUBJECTIVE:  Hospital Day: 0 days Cassandra [name] is a 70 y.o. female presenting with Nausea and Hypoglycemia .   Overnight events: No events overnight events Interval Events: patient unable to provide information  REVIEW OF SYSTEMS:  Patient unable to  Provide information  DRUG ALLERGIES:   Allergies  Allergen Reactions  . Alprazolam Other (See Comments)    Family preference, for patient to not take med  . Percocet [Oxycodone-Acetaminophen] Other (See Comments)    Family preference, for patient to not take med  . Codeine Diarrhea and Nausea And Vomiting  . Doxycycline Diarrhea and Nausea And Vomiting  . Hydrocodone Nausea And Vomiting  . Omnicef [Cefdinir] Nausea Only and Other (See Comments)    Constipation, tolerated Zosyn  . Augmentin [Amoxicillin-Pot Clavulanate] Hives and Rash    Has patient had a PCN reaction causing immediate rash, facial/tongue/throat swelling, SOB or lightheadedness with hypotension: Yes Has patient had a PCN reaction causing severe rash involving mucus membranes or skin necrosis: Yes Did a PCN reaction that required hospitalization No Did PCN reaction occurring within the last 10 years: Yes If all of the above answers are "NO", then may proceed with Cephalosporin use.  Pt states she has taken penicillin since, and was ok with it...  . Ciprofloxacin Hives    VITALS:  Blood pressure (!) 141/70, pulse 90, temperature 98.1 F (36.7 C), temperature source Oral, resp. rate 18, height 5\' 3"  (1.6 m), weight 57.7 kg (127 lb 3.2 oz), SpO2 96 %.  PHYSICAL EXAMINATION:   VITAL SIGNS: Vitals:   12/03/16 0211 12/03/16 0737  BP: (!) 131/52 (!) 141/70  Pulse: 79 90  Resp: 16 18  Temp: 98 F (36.7 C) 98.1 F (36.7 C)   GENERAL:70 y.o.female moderate distress given mental status.  HEAD: Normocephalic,  atraumatic.  EYES: Pupils equal, round, reactive to light. Unable to assess extraocular muscles given mental status/medical condition. No scleral icterus.  MOUTH: Moist mucosal membrane. Dentition intact. No abscess noted.  EAR, NOSE, THROAT: Clear without exudates. No external lesions.  NECK: Supple. No thyromegaly. No nodules. No JVD.  c collar in place PULMONARY: Clear to ascultation, without wheeze rails or rhonci. No use of accessory muscles, Good respiratory effort. good air entry bilaterally CHEST: Nontender to palpation.  CARDIOVASCULAR: S1 and S2. Regular rate and rhythm. No murmurs, rubs, or gallops. No edema. Pedal pulses 2+ bilaterally.  GASTROINTESTINAL: Soft, nontender, nondistended. No masses. Positive bowel sounds. No hepatosplenomegaly.  MUSCULOSKELETAL: No swelling, clubbing, or edema. Range of motion full in all extremities.  NEUROLOGIC: Unable to assess given mental status/medical condition SKIN: No ulceration, lesions, rashes, or cyanosis. Skin warm and dry. Turgor intact.  PSYCHIATRIC: Unable to assess given mental status/medical condition      LABORATORY PANEL:   CBC  Recent Labs Lab 12/03/16 0736  WBC 12.0*  HGB 10.6*  HCT 31.4*  PLT 346   ------------------------------------------------------------------------------------------------------------------  Chemistries   Recent Labs Lab 12/02/16 2004 12/03/16 0736  NA 139 134*  K 3.6 3.6  CL 109 102  CO2 24 22  GLUCOSE 56* 343*  BUN 33* 31*  CREATININE 1.82* 1.65*  CALCIUM 7.9* 7.9*  AST 24  --   ALT 15  --   ALKPHOS 82  --   BILITOT 0.6  --    ------------------------------------------------------------------------------------------------------------------  Cardiac Enzymes  Recent Labs  Lab 12/02/16 2004  TROPONINI <0.03   ------------------------------------------------------------------------------------------------------------------  RADIOLOGY:  Dg Chest Portable 1 View  Result  Date: 12/02/2016 CLINICAL DATA:  Nausea for 2 days.  Known cervical fracture. EXAM: PORTABLE CHEST 1 VIEW COMPARISON:  Chest radiograph 10/16/2016 FINDINGS: Cervical collar obscures evaluation of the upper chest. Cardiomediastinal contours are within normal limits. No focal airspace consolidation or pulmonary edema. Suspect small bilateral pleural effusions. No pneumothorax. IMPRESSION: Suspected small bilateral pleural effusions. No focal airspace disease. Electronically Signed   By: Deatra RobinsonKevin  Herman M.D.   On: 12/02/2016 20:58    EKG:   Orders placed or performed during the hospital encounter of 10/16/16  . EKG 12-Lead  . EKG 12-Lead  . ED EKG within 10 minutes  . ED EKG within 10 minutes  . EKG    ASSESSMENT AND PLAN:   Cassandra [name] Dolores is a 70 y.o. female presenting with Nausea and Hypoglycemia . Admitted 12/02/2016 : Day #: 0 days  1. Hypoglycemia: resolved, now hyperglycemia -- restart insulin 2. Uti: continue ceftriaxone follow cultures 3. Aki: improved 4. Hypothyroidism: synthroid   All the records are reviewed and case discussed with Care Management/Social Workerr. Management plans discussed with the patient, family and they are in agreement.  CODE STATUS: full TOTAL TIME TAKING CARE OF THIS PATIENT: 28 minutes.   POSSIBLE D/C IN 1DAYS, DEPENDING ON CLINICAL CONDITION.   Hower,  Mardi MainlandDavid K M.D on 12/03/2016 at 2:42 PM  Between 7am to 6pm - Pager - [phone]  After 6pm: House Pager: - [phone]  [facility]  Office  [phone]  CC: Primary care physician; Irving CopasHACKER,ROBERT KELLER, MD

## 2016-12-03 NOTE — Care Management Obs Status (Signed)
MEDICARE OBSERVATION STATUS NOTIFICATION   Patient Details  Name: Cassandra AbbeBillie A Vangieson MRN: [id number] Date of Birth: 11-03-1946   Medicare Observation Status Notification Given:       Marily MemosLisa M Shantera Monts, RN 12/03/2016, 2:23 PM

## 2016-12-03 NOTE — Progress Notes (Signed)
Pharmacy Antibiotic Note  Cassandra [name] is a 70 y.o. female admitted on 12/02/2016 with UTI.  Pharmacy has been consulted for ceftriaxone dosing.  Plan: Ceftriaxone 1 gm IV Q24H  Height: 5\' 3"  (160 cm) Weight: 150 lb (68 kg) IBW/kg (Calculated) : 52.4  Temp (24hrs), Avg:97.9 F (36.6 C), Min:97.8 F (36.6 C), Max:98 F (36.7 C)   Recent Labs Lab 12/02/16 2004  WBC 12.3*  CREATININE 1.82*    Estimated Creatinine Clearance: 27 mL/min (by C-G formula based on SCr of 1.82 mg/dL (H)).    Allergies  Allergen Reactions  . Alprazolam Other (See Comments)    Family preference, for patient to not take med  . Percocet [Oxycodone-Acetaminophen] Other (See Comments)    Family preference, for patient to not take med  . Codeine Diarrhea and Nausea And Vomiting  . Doxycycline Diarrhea and Nausea And Vomiting  . Hydrocodone Nausea And Vomiting  . Omnicef [Cefdinir] Nausea Only and Other (See Comments)    Constipation, tolerated Zosyn  . Augmentin [Amoxicillin-Pot Clavulanate] Hives and Rash    Has patient had a PCN reaction causing immediate rash, facial/tongue/throat swelling, SOB or lightheadedness with hypotension: Yes Has patient had a PCN reaction causing severe rash involving mucus membranes or skin necrosis: Yes Did a PCN reaction that required hospitalization No Did PCN reaction occurring within the last 10 years: Yes If all of the above answers are "NO", then may proceed with Cephalosporin use.  Pt states she has taken penicillin since, and was ok with it...  . Ciprofloxacin Hives    Thank you for allowing pharmacy to be a part of this patient's care.  Carola FrostNathan A Castella Lerner, Pharm.D., BCPS Clinical Pharmacist 12/03/2016 2:16 AM

## 2016-12-03 NOTE — Clinical Social Work Note (Signed)
Clinical Social Work Assessment  Patient Details  Name: Cassandra [name] MRN: [id number] Date of Birth: 1946-03-11  Date of referral:  12/03/16               Reason for consult:  Other (Comment Required) (From [facility] )                Permission sought to share information with:  Oceanographeracility Contact Representative Permission granted to share information::  Yes, Verbal Permission Granted  Name::      [facility]   Agency::     Relationship::     Contact Information:     Housing/Transportation Living arrangements for the past 2 months:  Skilled Holiday representativeursing Facility, Single Family Home Source of Information:  Adult Children, Power of Attorney Patient Interpreter Needed:  None Criminal Activity/Legal Involvement Pertinent to Current Situation/Hospitalization:  No - Comment as needed Significant Relationships:  Adult Children Lives with:  Facility Resident Do you feel safe going back to the place where you live?    Need for family participation in patient care:  Yes (Comment)  Care giving concerns:  [facility] where she is a short term rehab resident.    Social Worker assessment / plan:  Visual merchandiserClinical Social Worker (CSW) reviewed chart and noted that patient is from [facility]. Per [facility] admissions coordinator at [facility] patient's family did not want to do a bed hold, long term Medicaid application was denied, and patient may be able to come back to [facility] under [facility]hab however she will be in her co-pay days. Per Cassandra ComptonKarla patient's family will have to work some things out with the business office before patient can return to [facility]. CSW requested PT consult form MD. CSW attempted to meet with patient however she was asleep. CSW contacted patient's daughter Cassandra [name] to complete assessment. Per Cassandra ReiningNicole she is patient's HPOA and stated that she did not get a call from [facility] about a bed hod. Daughter reported that they are in the process of applying for Medicaid. CSW  explained that patient could go back to SNF under rehab however she would be in her co-pay days. Daughter verbalized her understanding and reported that she really wants patient to return to [facility]. Per daughter she will reach out to [place] at [facility] to discuss financial details. Cassandra ComptonKarla is aware of above. CSW will continue to follow and assist as needed.   Employment status:  Retired Database administratornsurance information:  Managed Medicare PT Recommendations:  Not assessed at this time Information / Referral to community resources:  Skilled Nursing Facility  Patient/Family's Response to care:  Patient's daughter Cassandra [name] prefers for patient to return to [facility]. Per daughter [facility] and will not be able to come home.   Patient/Family's Understanding of and Emotional Response to Diagnosis, Current Treatment, and Prognosis:  Patient's daughter was very pleasant and thanked CSW for calling.    Emotional Assessment Appearance:  Appears stated age Attitude/Demeanor/Rapport:  Unable to Assess Affect (typically observed):  Unable to Assess Orientation:  Oriented to Self, Oriented to Place, Fluctuating Orientation (Suspected and/or reported Sundowners) Alcohol / Substance use:  Not Applicable Psych involvement (Current and /or in the community):  No (Comment)  Discharge Needs  Concerns to be addressed:  Discharge Planning Concerns Readmission within the last 30 days:  No Current discharge risk:  Dependent with Mobility Barriers to Discharge:  Continued Medical Work up   [employer],  Cassandra CrockerBailey M, LCSW 12/03/2016, 6:33 PM

## 2016-12-03 NOTE — H&P (Signed)
[facility] [place] [facility]   PATIENT NAME: Cassandra [name] Deol    MR#:  [id number]  DATE OF BIRTH:  March 14, 1946  DATE OF ADMISSION:  12/02/2016  PRIMARY CARE PHYSICIAN: Irving CopasHACKER,ROBERT KELLER, MD   REQUESTING/REFERRING PHYSICIAN: Manson PasseyBrown, M.D.  CHIEF COMPLAINT:   Chief Complaint  Patient presents with  . Nausea  . Hypoglycemia    HISTORY OF PRESENT ILLNESS:  Cassandra [name] Apps  is a 70 y.o. female who presents with An episode of hypoglycemia. Patient is out of facility and had some nausea today and when her blood sugars checked was found to be hypoglycemic. She had been hyperglycemic earlier in the day. Her blood sugar came up only minimally with oral dextrose, and so she was sent to the ED for further evaluation. Here she was found to have a UTI and persistent hypoglycemia. She responded well to D5 infusion. Hospitals were called for admission for further treatment  PAST MEDICAL HISTORY:   Past Medical History:  Diagnosis Date  . Allergic rhinitis   . Anemia   . Anxiety   . Arthritis    "mostly feet, hands" (10/17/2016)  . Benign hypertension with CKD (chronic kidney disease) stage III   . Benign paroxysmal positional vertigo   . Bipolar disorder (HCC)   . Broken finger   . Broken shoulder   . Broken toes   . Cervicalgia   . CHF (congestive heart failure) (HCC) 07/2016  . CKD (chronic kidney disease) stage 3, GFR 30-59 ml/min   . COPD (chronic obstructive pulmonary disease) (HCC)   . Depression   . Elevated liver enzymes Hep B/C neg 2014  . Fall at nursing home 10/15/2016  . GERD (gastroesophageal reflux disease)   . Glaucoma   . High cholesterol   . History of alcohol use   . History of blood transfusion 09/2016   "blood got really really low"  . Hypertension   . Hypothyroidism   . Interstitial cystitis    bladder stretched every 9 months  . Migraines    "pretty much qd" (10/17/2016)  . Psoriasis   . Stroke (HCC) 05/16/2016   Left  occipital and thalamic, right hippocampal  . Thyroid disease   . Tobacco use   . 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 Vitamin B12 deficiency     PAST SURGICAL HISTORY:   Past Surgical History:  Procedure Laterality Date  . ANTERIOR CERVICAL DECOMP/DISCECTOMY FUSION  ~ 2009  . APPENDECTOMY    . BACK SURGERY    . BLADDER SUSPENSION    . CATARACT EXTRACTION W/ INTRAOCULAR LENS  IMPLANT, BILATERAL Bilateral   . DILATION AND CURETTAGE OF UTERUS    . HERNIA REPAIR    . LAPAROSCOPIC CHOLECYSTECTOMY    . TONSILLECTOMY    . TUBAL LIGATION    . VAGINAL HYSTERECTOMY     with oophorectomy    SOCIAL HISTORY:   Social History  Substance Use Topics  . Smoking status: Former Smoker    Packs/day: 0.25    Years: 10.00    Types: Cigarettes    Quit date: 12/1923  . Smokeless tobacco: Never Used  . Alcohol use No     Comment: Hasn't had any alcohol "for 2 yrs" (10/17/2016)    FAMILY HISTORY:   Family History  Problem Relation Age of Onset  . Alcohol abuse Mother   . Arthritis Mother   . Asthma Mother   . Cancer Mother     colon  cancer  . Hypertension Mother   . Migraines Mother   . Stroke Mother   . Lung disease Mother   . COPD Mother   . Diabetes Father   . Hypertension Father   . Heart disease Father   . Heart attack Father   . Heart disease Paternal Grandmother   . Diabetes Paternal Grandmother   . Stroke Paternal Grandmother   . Cancer Paternal Grandmother   . Diabetes Paternal Grandfather     DRUG ALLERGIES:   Allergies  Allergen Reactions  . Alprazolam Other (See Comments)    Family preference, for patient to not take med  . Percocet [Oxycodone-Acetaminophen] Other (See Comments)    Family preference, for patient to not take med  . Codeine Diarrhea and Nausea And Vomiting  . Doxycycline Diarrhea and Nausea And Vomiting  . Hydrocodone Nausea And Vomiting  . Omnicef [Cefdinir] Nausea Only and Other (See Comments)    Constipation,  tolerated Zosyn  . Augmentin [Amoxicillin-Pot Clavulanate] Hives and Rash    Has patient had a PCN reaction causing immediate rash, facial/tongue/throat swelling, SOB or lightheadedness with hypotension: Yes Has patient had a PCN reaction causing severe rash involving mucus membranes or skin necrosis: Yes Did a PCN reaction that required hospitalization No Did PCN reaction occurring within the last 10 years: Yes If all of the above answers are "NO", then may proceed with Cephalosporin use.  Pt states she has taken penicillin since, and was ok with it...  . Ciprofloxacin Hives    MEDICATIONS AT HOME:   Prior to Admission medications   Medication Sig Start Date End Date Taking? Authorizing Provider  acetaminophen (TYLENOL) 325 MG tablet Take 650 mg by mouth every 6 (six) hours as needed for mild pain or fever.     Historical Provider, MD  Amino Acids-Protein Hydrolys (FEEDING SUPPLEMENT, PRO-STAT SUGAR FREE 64,) LIQD Take 30 mLs by mouth 2 (two) times daily.    Historical Provider, MD  amLODipine (NORVASC) 5 MG tablet Take 5 mg by mouth daily.    Historical Provider, MD  aspirin 81 MG chewable tablet Chew 1 tablet (81 mg total) by mouth daily. 09/24/16   Vassie Loll, MD  atorvastatin (LIPITOR) 10 MG tablet Take 1 tablet (10 mg total) by mouth daily at 6 PM. Patient taking differently: Take 20 mg by mouth daily at 6 PM.  05/19/16   Rolly Salter, MD  carvedilol (COREG) 3.125 MG tablet Take 1 tablet (3.125 mg total) by mouth 2 (two) times daily with a meal. 07/18/16   Mauricio Annett Gula, MD  cephALEXin (KEFLEX) 250 MG/5ML suspension Take 10 mLs (500 mg total) by mouth 2 (two) times daily. 12/02/16   Sharman Cheek, MD  clopidogrel (PLAVIX) 75 MG tablet Take 75 mg by mouth daily.    Historical Provider, MD  escitalopram (LEXAPRO) 20 MG tablet Take 20 mg by mouth at bedtime.     Historical Provider, MD  famotidine (PEPCID) 20 MG tablet Take 1 tablet (20 mg total) by mouth daily. 10/22/16    Osvaldo Shipper, MD  insulin lispro (HUMALOG) 100 UNIT/ML injection Inject 0.01-0.05 mLs (1-5 Units total) into the skin 3 (three) times daily before meals. For CBGs < 150 = 0 units; 151 - 200 = 1 units, 201 - 250 = 2 units, 251 - 300 = 3 units; 301-350 = 4 units; 351-400 = 5 units; > 400 notify the provider. 11/21/16   Dinah C Ngetich, NP  iron polysaccharides (NIFEREX) 150 MG capsule Take  1 capsule (150 mg total) by mouth 2 (two) times daily. 06/28/16   Vassie Loll, MD  meclizine (ANTIVERT) 25 MG tablet Take 1 tablet (25 mg total) by mouth 3 (three) times daily as needed for dizziness. 02/12/16   Chilton Si, MD  NUTRITIONAL SUPPLEMENT LIQD Give Med Pass 2.0  Sugar free 120 mls by mouth three times daily between meals as a feeding supplement.    Historical Provider, MD  nystatin-triamcinolone ointment (MYCOLOG) Apply ointment every shift to perianal / buttocks for rash    Historical Provider, MD  ondansetron (ZOFRAN ODT) 4 MG disintegrating tablet Take 1 tablet (4 mg total) by mouth every 8 (eight) hours as needed for nausea or vomiting. 12/02/16   Sharman Cheek, MD  SYNTHROID 100 MCG tablet Take 1 tablet (100 mcg total) by mouth daily before breakfast. Take along with 25 mcg take 1/2 tablet daily for a total of 112 mcg Tablet. Use only brand name medication. 11/19/16   Dinah C Ngetich, NP  traMADol (ULTRAM) 50 MG tablet Take 1 tablet (50 mg total) by mouth every 6 (six) hours as needed. 10/22/16   Osvaldo Shipper, MD    REVIEW OF SYSTEMS:  Review of Systems  Unable to perform ROS: Acuity of condition     VITAL SIGNS:   Vitals:   12/02/16 2230 12/02/16 2313 12/03/16 0016 12/03/16 0105  BP: 130/67 129/71 128/72 137/70  Pulse: 72 72 74 74  Resp: 14 16 16 16   Temp:      TempSrc:      SpO2: 97% 96% 100% 99%  Weight:      Height:       Wt Readings from Last 3 Encounters:  12/02/16 68 kg (150 lb)  11/21/16 56.4 kg (124 lb 6.4 oz)  11/19/16 55 kg (121 lb 3.2 oz)    PHYSICAL  EXAMINATION:  Physical Exam  Vitals reviewed. Constitutional: She is oriented to person, place, and time. She appears well-developed and well-nourished. No distress.  HENT:  Head: Normocephalic and atraumatic.  Mouth/Throat: Oropharynx is clear and moist.  Eyes: Conjunctivae and EOM are normal. Pupils are equal, round, and reactive to light. No scleral icterus.  Neck: Normal range of motion. Neck supple. No JVD present. No thyromegaly present.  Cardiovascular: Normal rate, regular rhythm and intact distal pulses.  Exam reveals no gallop and no friction rub.   No murmur heard. Respiratory: Effort normal and breath sounds normal. No respiratory distress. She has no wheezes. She has no rales.  GI: Soft. Bowel sounds are normal. She exhibits no distension. There is tenderness (mild low abdominal tenderness).  Musculoskeletal: Normal range of motion. She exhibits no edema.  No arthritis, no gout  Lymphadenopathy:    She has no cervical adenopathy.  Neurological: She is alert and oriented to person, place, and time. No cranial nerve deficit.  No dysarthria, no aphasia  Skin: Skin is warm and dry. No rash noted. No erythema.  Psychiatric: She has a normal mood and affect. Her behavior is normal. Judgment and thought content normal.    LABORATORY PANEL:   CBC  Recent Labs Lab 12/02/16 2004  WBC 12.3*  HGB 8.9*  HCT 26.5*  PLT 290   ------------------------------------------------------------------------------------------------------------------  Chemistries   Recent Labs Lab 12/02/16 2004  NA 139  K 3.6  CL 109  CO2 24  GLUCOSE 56*  BUN 33*  CREATININE 1.82*  CALCIUM 7.9*  AST 24  ALT 15  ALKPHOS 82  BILITOT 0.6   ------------------------------------------------------------------------------------------------------------------  Cardiac Enzymes  Recent Labs Lab 12/02/16 2004  TROPONINI <0.03    ------------------------------------------------------------------------------------------------------------------  RADIOLOGY:  Dg Chest Portable 1 View  Result Date: 12/02/2016 CLINICAL DATA:  Nausea for 2 days.  Known cervical fracture. EXAM: PORTABLE CHEST 1 VIEW COMPARISON:  Chest radiograph 10/16/2016 FINDINGS: Cervical collar obscures evaluation of the upper chest. Cardiomediastinal contours are within normal limits. No focal airspace consolidation or pulmonary edema. Suspect small bilateral pleural effusions. No pneumothorax. IMPRESSION: Suspected small bilateral pleural effusions. No focal airspace disease. Electronically Signed   By: Deatra RobinsonKevin  Herman M.D.   On: 12/02/2016 20:58    EKG:   Orders placed or performed during the hospital encounter of 10/16/16  . EKG 12-Lead  . EKG 12-Lead  . ED EKG within 10 minutes  . ED EKG within 10 minutes  . EKG    IMPRESSION AND PLAN:  Principal Problem:   UTI (urinary tract infection) - patient started on IV antibiotics at culture sent from the ED. Active Problems:   Acute on chronic kidney failure (HCC) - expect this to improve with IV fluid administration, suspect this is likely from prerenal insult on chronic kidney disease.   Hypoglycemia - likely related to her UTI and the fact that she generally has probably only adequate oral intake, which has decreased given the infection. We will have her on a D5 infusion for now. Although all anti-glycemic for now.   Insulin dependent type 2 diabetes mellitus, uncontrolled (HCC) - holding anti-glycemic status above, carb modified diet   Anxiety - continue home meds   Chronic diastolic heart failure (HCC) - continue home meds   Depression - continue home meds   Uncontrolled Hypothyroidism - home dose thyroid replacement  All the records are reviewed and case discussed with ED provider. Management plans discussed with the patient and/or family.  DVT PROPHYLAXIS: SubQ heparin  GI PROPHYLAXIS:  H2 blocker  ADMISSION STATUS: Observation  CODE STATUS: Full, this is clarified with her daughter at the bedside tonight. Code Status History    Date Active Date Inactive Code Status Order ID Comments User Context   10/21/2016  2:27 PM 10/23/2016  6:25 PM DNR [id number]  Ranae PalmsSarah Elizabeth Dunn, NP Inpatient   10/17/2016  3:33 PM 10/21/2016  2:27 PM Full Code [id number]  Russella DarAllison L Ellis, NP Inpatient   10/02/2016  5:25 PM 10/09/2016  6:34 PM Full Code [id number]  Calvert CantorSaima Rizwan, MD ED   09/20/2016  6:24 PM 09/23/2016  8:02 PM Full Code [id number]  Vassie Lollarlos Madera, MD Inpatient   09/06/2016  5:09 PM 09/09/2016  2:36 PM Full Code [id number]  Dorothea OgleIskra M Myers, MD Inpatient   08/26/2016  7:26 PM 08/30/2016  7:59 PM Full Code [id number]  Army ChacoLaura K Easterwood, NP Inpatient   08/26/2016  5:12 PM 08/26/2016  7:26 PM Full Code [id number]  Calvert CantorSaima Rizwan, MD ED   07/19/2016 10:19 PM 07/21/2016  7:49 PM Full Code [id number]  Michael LitterNikki Carter, MD Inpatient   07/11/2016 11:42 PM 07/18/2016  5:48 PM Full Code [id number]  Bobette Moavid Manuel Ortiz, MD ED   06/24/2016  5:17 AM 06/25/2016  9:55 AM Full Code [id number]  Karl ItoSteven E Sommer, MD Inpatient   06/22/2016  6:48 PM 06/24/2016  5:17 AM Full Code [id number]  Roslynn AmbleJennings E Nestor, MD ED   05/06/2016  4:08 PM 05/19/2016  6:12 PM Full Code [id number]  Alyson ReedyWesam G Yacoub, MD Inpatient   05/06/2016  2:59 PM 05/06/2016  4:08 PM Full Code [id number]  Eugenie Norrieana G Blakeney, NP ED  05/06/2016 12:54 PM 05/06/2016  2:59 PM Full Code [id number]  Eugenie Norrie, NP ED    Questions for Most Recent Historical Code Status (Order [id number])    Question Answer Comment   In the event of cardiac or respiratory ARREST Do not call a "code blue"    In the event of cardiac or respiratory ARREST Do not perform Intubation, CPR, defibrillation or ACLS    In the event of cardiac or respiratory ARREST Use medication by any route, position, wound care, and other measures to relive pain and suffering. May use oxygen, suction and manual treatment of airway  obstruction as needed for comfort.    Comments Per daughter, Gar Gibbon [facility])       TOTAL TIME TAKING CARE OF THIS PATIENT: 40 minutes.    Tadan Shill FIELDING 12/03/2016, 1:16 AM  [facility]  Office  [phone]  CC: Primary care physician; Irving Copas, MD

## 2016-12-03 NOTE — Progress Notes (Signed)
PT Cancellation Note  Patient Details Name: Cassandra [name] MRN: [id number] DOB: Sep 19, 1946   Cancelled Treatment:    Reason Eval/Treat Not Completed: Patient's level of consciousness Pt marginally awake, eyes closed most of the time.  Family in room reporting that when awake she is very confused, requested PT to come back tomorrow.  Malachi ProGalen R Kellsey Sansone , DPT 12/03/2016, 4:07 PM

## 2016-12-03 NOTE — Progress Notes (Signed)
Alert but with some confusion. Blood sugar in the 150's no more hypoglycemic episodes. Iv infusing without difficulty. C-collar remaining on for previous cervical fractures.

## 2016-12-04 LAB — GLUCOSE, CAPILLARY
GLUCOSE-CAPILLARY: 172 mg/dL — AB (ref 65–99)
Glucose-Capillary: 175 mg/dL — ABNORMAL HIGH (ref 65–99)

## 2016-12-04 MED ORDER — CEPHALEXIN 500 MG PO CAPS: 500.0000 mg | ORAL_CAPSULE | Freq: Two times a day (BID) | ORAL | 0 refills | Status: DC

## 2016-12-04 MED ORDER — CEFDINIR 300 MG PO CAPS: 300.0000 mg | ORAL_CAPSULE | Freq: Every day | ORAL | 0 refills | Status: DC

## 2016-12-04 MED ORDER — INSULIN GLARGINE 100 UNIT/ML ~~LOC~~ SOLN
5.0000 [IU] | Freq: Every day | SUBCUTANEOUS | Status: DC
  Filled 2016-12-04: qty 0.05

## 2016-12-04 MED ORDER — INSULIN ASPART 100 UNIT/ML ~~LOC~~ SOLN: 0.0000 [IU] | Freq: Every day | SUBCUTANEOUS | Status: DC

## 2016-12-04 MED ORDER — TRAMADOL HCL 50 MG PO TABS: 50.0000 mg | ORAL_TABLET | Freq: Four times a day (QID) | ORAL | 0 refills | Status: DC | PRN

## 2016-12-04 MED ORDER — INSULIN ASPART 100 UNIT/ML ~~LOC~~ SOLN: 0.0000 [IU] | Freq: Three times a day (TID) | SUBCUTANEOUS | Status: DC

## 2016-12-04 NOTE — Discharge Summary (Addendum)
[facility] - [place] [facility]   PATIENT NAME: Cassandra JerseyBillie Staat    MR#:  [id number]  DATE OF BIRTH:  [birth year]  DATE OF ADMISSION:  12/02/2016 ADMITTING PHYSICIAN: Oralia Manisavid Willis, MD  DATE OF DISCHARGE: 12/04/16  PRIMARY CARE PHYSICIAN: Irving CopasHACKER,ROBERT KELLER, MD    ADMISSION DIAGNOSIS:  Lower urinary tract infectious disease [N39.0]  DISCHARGE DIAGNOSIS:  Principal Problem:   UTI (urinary tract infection) Active Problems: hypoglycemia   Insulin dependent type 2 diabetes mellitus, uncontrolled (HCC)   Depression   Uncontrolled Hypothyroidism   Anxiety   Chronic diastolic heart failure (HCC)   SECONDARY DIAGNOSIS:   Past Medical History:  Diagnosis Date  . Allergic rhinitis   . Anemia   . Anxiety   . Arthritis    "mostly feet, hands" (10/17/2016)  . Benign hypertension with CKD (chronic kidney disease) stage III   . Benign paroxysmal positional vertigo   . Bipolar disorder (HCC)   . Broken finger   . Broken shoulder   . Broken toes   . Cervicalgia   . CHF (congestive heart failure) (HCC) 07/2016  . CKD (chronic kidney disease) stage 3, GFR 30-59 ml/min   . COPD (chronic obstructive pulmonary disease) (HCC)   . Depression   . Elevated liver enzymes Hep B/C neg 2014  . Fall at nursing home 10/15/2016  . GERD (gastroesophageal reflux disease)   . Glaucoma   . High cholesterol   . History of alcohol use   . History of blood transfusion 09/2016   "blood got really really low"  . Hypertension   . Hypothyroidism   . Interstitial cystitis    bladder stretched every 9 months  . Migraines    "pretty much qd" (10/17/2016)  . Psoriasis   . Stroke (HCC) 05/16/2016   Left occipital and thalamic, right hippocampal  . Thyroid disease   . Tobacco use   . 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 Vitamin B12 deficiency     HOSPITAL COURSE:  Cassandra [name]  is a 70 y.o. female admitted 12/02/2016 with chief complaint Nausea and  Hypoglycemia . Please see H&P performed by Oralia Manisavid Willis, MD for further information. Patient presented with the above symptoms. Found to have UTI and hypoglycemia. Started on antibiotics and dextrose infusion with improvement. Blood suger resolved.  DISCHARGE CONDITIONS:   stable  CONSULTS OBTAINED:    DRUG ALLERGIES:   Allergies  Allergen Reactions  . Alprazolam Other (See Comments)    Family preference, for patient to not take med  . Percocet [Oxycodone-Acetaminophen] Other (See Comments)    Family preference, for patient to not take med  . Codeine Diarrhea and Nausea And Vomiting  . Doxycycline Diarrhea and Nausea And Vomiting  . Hydrocodone Nausea And Vomiting  . Omnicef [Cefdinir] Nausea Only and Other (See Comments)    Constipation, tolerated Zosyn  . Augmentin [Amoxicillin-Pot Clavulanate] Hives and Rash    Has patient had a PCN reaction causing immediate rash, facial/tongue/throat swelling, SOB or lightheadedness with hypotension: Yes Has patient had a PCN reaction causing severe rash involving mucus membranes or skin necrosis: Yes Did a PCN reaction that required hospitalization No Did PCN reaction occurring within the last 10 years: Yes If all of the above answers are "NO", then may proceed with Cephalosporin use.  Pt states she has taken penicillin since, and was ok with it...  . Ciprofloxacin Hives    DISCHARGE MEDICATIONS:   Current Discharge Medication List  START taking these medications   Details  cefdinir (OMNICEF) 300 MG capsule Take 1 capsule (300 mg total) by mouth daily. Qty: 4 capsule, Refills: 0    ondansetron (ZOFRAN ODT) 4 MG disintegrating tablet Take 1 tablet (4 mg total) by mouth every 8 (eight) hours as needed for nausea or vomiting. Qty: 20 tablet, Refills: 0      CONTINUE these medications which have CHANGED   Details  traMADol (ULTRAM) 50 MG tablet Take 1 tablet (50 mg total) by mouth every 6 (six) hours as needed. Qty: 30 tablet,  Refills: 0      CONTINUE these medications which have NOT CHANGED   Details  acetaminophen (TYLENOL) 325 MG tablet Take 650 mg by mouth every 6 (six) hours as needed for mild pain or fever.     Amino Acids-Protein Hydrolys (FEEDING SUPPLEMENT, PRO-STAT SUGAR FREE 64,) LIQD Take 30 mLs by mouth 2 (two) times daily.    amLODipine (NORVASC) 5 MG tablet Take 5 mg by mouth daily.    aspirin 81 MG chewable tablet Chew 1 tablet (81 mg total) by mouth daily. Qty: 30 tablet, Refills: 1    atorvastatin (LIPITOR) 10 MG tablet Take 1 tablet (10 mg total) by mouth daily at 6 PM. Qty: 30 tablet, Refills: 0    carvedilol (COREG) 3.125 MG tablet Take 1 tablet (3.125 mg total) by mouth 2 (two) times daily with a meal. Qty: 60 tablet, Refills: 0    clopidogrel (PLAVIX) 75 MG tablet Take 75 mg by mouth daily.    escitalopram (LEXAPRO) 20 MG tablet Take 20 mg by mouth at bedtime.     famotidine (PEPCID) 20 MG tablet Take 1 tablet (20 mg total) by mouth daily.    insulin lispro (HUMALOG) 100 UNIT/ML injection Inject 0.01-0.05 mLs (1-5 Units total) into the skin 3 (three) times daily before meals. For CBGs < 150 = 0 units; 151 - 200 = 1 units, 201 - 250 = 2 units, 251 - 300 = 3 units; 301-350 = 4 units; 351-400 = 5 units; > 400 notify the provider. Qty: 10 mL    iron polysaccharides (NIFEREX) 150 MG capsule Take 1 capsule (150 mg total) by mouth 2 (two) times daily. Qty: 60 capsule, Refills: 1    meclizine (ANTIVERT) 25 MG tablet Take 1 tablet (25 mg total) by mouth 3 (three) times daily as needed for dizziness. Qty: 30 tablet, Refills: 0    NUTRITIONAL SUPPLEMENT LIQD Give Med Pass 2.0  Sugar free 120 mls by mouth three times daily between meals as a feeding supplement.    nystatin-triamcinolone ointment (MYCOLOG) Apply ointment every shift to perianal / buttocks for rash    SYNTHROID 100 MCG tablet Take 1 tablet (100 mcg total) by mouth daily before breakfast. Take along with 25 mcg take 1/2  tablet daily for a total of 112 mcg Tablet. Use only brand name medication.         DISCHARGE INSTRUCTIONS:    DIET:  Diabetic diet  DISCHARGE CONDITION:  Stable  ACTIVITY:  Activity as tolerated  OXYGEN:  Home Oxygen: No.   Oxygen Delivery: room air  DISCHARGE LOCATION:  nursing home   If you experience worsening of your admission symptoms, develop shortness of breath, life threatening emergency, suicidal or homicidal thoughts you must seek medical attention immediately by calling 911 or calling your MD immediately  if symptoms less severe.  You Must read complete instructions/literature along with all the possible adverse reactions/side effects for all  the Medicines you take and that have been prescribed to you. Take any new Medicines after you have completely understood and accpet all the possible adverse reactions/side effects.   Please note  You were cared for by a hospitalist during your hospital stay. If you have any questions about your discharge medications or the care you received while you were in the hospital after you are discharged, you can call the unit and asked to speak with the hospitalist on call if the hospitalist that took care of you is not available. Once you are discharged, your primary care physician will handle any further medical issues. Please note that NO REFILLS for any discharge medications will be authorized once you are discharged, as it is imperative that you return to your primary care physician (or establish a relationship with a primary care physician if you do not have one) for your aftercare needs so that they can reassess your need for medications and monitor your lab values.    On the day of Discharge:   VITAL SIGNS:  Blood pressure (!) 160/68, pulse 87, temperature 98.7 F (37.1 C), temperature source Oral, resp. rate 16, height 5\' 3"  (1.6 m), weight 57.6 kg (126 lb 14.4 oz), SpO2 94 %.  I/O:  No intake or output data in the 24  hours ending 12/04/16 1115  PHYSICAL EXAMINATION:  GENERAL:  70 y.o.-year-old patient lying in the bed with no acute distress.  EYES: Pupils equal, round, reactive to light and accommodation. No scleral icterus. Extraocular muscles intact.  HEENT: Head atraumatic, normocephalic. Oropharynx and nasopharynx clear.  NECK:  c collar - Supple, no jugular venous distention. No thyroid enlargement, no tenderness.  LUNGS: Normal breath sounds bilaterally, no wheezing, rales,rhonchi or crepitation. No use of accessory muscles of respiration.  CARDIOVASCULAR: S1, S2 normal. No murmurs, rubs, or gallops.  ABDOMEN: Soft, non-tender, non-distended. Bowel sounds present. No organomegaly or mass.  EXTREMITIES: No pedal edema, cyanosis, or clubbing.  NEUROLOGIC: Cranial nerves II through XII are intact. Muscle strength 5/5 in all extremities. Sensation intact. Gait not checked.  PSYCHIATRIC: The patient is alert and oriented x 3.  SKIN: No obvious rash, lesion, or ulcer.   DATA REVIEW:   CBC  Recent Labs Lab 12/03/16 0736  WBC 12.0*  HGB 10.6*  HCT 31.4*  PLT 346    Chemistries   Recent Labs Lab 12/02/16 2004 12/03/16 0736  NA 139 134*  K 3.6 3.6  CL 109 102  CO2 24 22  GLUCOSE 56* 343*  BUN 33* 31*  CREATININE 1.82* 1.65*  CALCIUM 7.9* 7.9*  AST 24  --   ALT 15  --   ALKPHOS 82  --   BILITOT 0.6  --     Cardiac Enzymes  Recent Labs Lab 12/02/16 2004  TROPONINI <0.03    Microbiology Results  Results for orders placed or performed during the hospital encounter of 12/02/16  Urine culture     Status: Abnormal (Preliminary result)   Collection Time: 12/02/16  9:30 PM  Result Value Ref Range Status   Specimen Description URINE, RANDOM  Final   Special Requests NONE  Final   Culture >=100,000 COLONIES/mL GRAM NEGATIVE RODS (A)  Final   Report Status PENDING  Incomplete    RADIOLOGY:  Dg Chest Portable 1 View  Result Date: 12/02/2016 CLINICAL DATA:  Nausea for 2 days.   Known cervical fracture. EXAM: PORTABLE CHEST 1 VIEW COMPARISON:  Chest radiograph 10/16/2016 FINDINGS: Cervical collar obscures evaluation of the  upper chest. Cardiomediastinal contours are within normal limits. No focal airspace consolidation or pulmonary edema. Suspect small bilateral pleural effusions. No pneumothorax. IMPRESSION: Suspected small bilateral pleural effusions. No focal airspace disease. Electronically Signed   By: Deatra Robinson M.D.   On: 12/02/2016 20:58     Management plans discussed with the patient, family and they are in agreement.  CODE STATUS:     Code Status Orders        Start     Ordered   12/03/16 0208  Full code  Continuous     12/03/16 0207    Code Status History    Date Active Date Inactive Code Status Order ID Comments User Context   10/21/2016  2:27 PM 10/23/2016  6:25 PM DNR [id number]  Ranae Palms, NP Inpatient   10/17/2016  3:33 PM 10/21/2016  2:27 PM Full Code [id number]  Russella Dar, NP Inpatient   10/02/2016  5:25 PM 10/09/2016  6:34 PM Full Code [id number]  Calvert Cantor, MD ED   09/20/2016  6:24 PM 09/23/2016  8:02 PM Full Code [id number]  Vassie Loll, MD Inpatient   09/06/2016  5:09 PM 09/09/2016  2:36 PM Full Code [id number]  Dorothea Ogle, MD Inpatient   08/26/2016  7:26 PM 08/30/2016  7:59 PM Full Code [id number]  Army Chaco, NP Inpatient   08/26/2016  5:12 PM 08/26/2016  7:26 PM Full Code [id number]  Calvert Cantor, MD ED   07/19/2016 10:19 PM 07/21/2016  7:49 PM Full Code [id number]  Michael Litter, MD Inpatient   07/11/2016 11:42 PM 07/18/2016  5:48 PM Full Code [id number]  Bobette Mo, MD ED   06/24/2016  5:17 AM 06/25/2016  9:55 AM Full Code [id number]  Karl Ito, MD Inpatient   06/22/2016  6:48 PM 06/24/2016  5:17 AM Full Code [id number]  Roslynn Amble, MD ED   05/06/2016  4:08 PM 05/19/2016  6:12 PM Full Code [id number]  Alyson Reedy, MD Inpatient   05/06/2016  2:59 PM 05/06/2016  4:08 PM Full Code [id number]  Eugenie Norrie, NP ED    05/06/2016 12:54 PM 05/06/2016  2:59 PM Full Code [id number]  Eugenie Norrie, NP ED      TOTAL TIME TAKING CARE OF THIS PATIENT: 33 minutes.    Akhila Mahnken,  Mardi Mainland.D on 12/04/2016 at 11:15 AM  Between 7am to 6pm - Pager - [phone]  After 6pm go to [email or web address] - Social research officer, government  [employer] [facility]  Office  [phone]  CC: Primary care physician; Irving Copas, MD

## 2016-12-04 NOTE — Progress Notes (Signed)
Spoke with Arlys JohnBrian, [facility] at [phone], to notify of non-emergent EMS transport.  Auth notification reference given as [id number].   Service date range good for 90 days starting from 12/04/16.   Geographical gap exception requested to determine if services can be considered at an in-network level.

## 2016-12-04 NOTE — Progress Notes (Signed)
Patient is medically stable for [facility]. [facility] patient's husband is coming to [facility] at 3 pm to complete necessary paper work for patient to return. Per Paula ComptonKarla EMS can be called for patient now. Patient will go to room 703. RN will call report to [facility] station at [facility] and arrange EMS for transport. Clinical Child psychotherapistocial Worker (CSW) sent D/C orders to [facility] via [employer]. CSW contacted patient's daughter Joni Reiningicole and made her aware of above. Please reconsult if future social work needs arise. CSW signing off.   Baker Hughes IncorporatedBailey Nickole Adamek, LCSW [phone]
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
## 2016-12-04 NOTE — Progress Notes (Signed)
Inpatient Diabetes Program Recommendations  AACE/ADA: New Consensus Statement on Inpatient Glycemic Control (2015)  Target Ranges:  Prepandial:   less than 140 mg/dL      Peak postprandial:   less than 180 mg/dL (1-2 hours)      Critically ill patients:  140 - 180 mg/dL   Lab Results  Component Value Date   GLUCAP 175 (H) 12/04/2016   HGBA1C 7.5 11/18/2016    Review of Glycemic Control:  Results for Cassandra [name], Cassandra [name] (MRN [id number]) as of 12/04/2016 09:52  Ref. Range 12/03/2016 00:33 12/03/2016 02:22 12/03/2016 11:37 12/03/2016 12:28 12/03/2016 17:20 12/04/2016 07:18  Glucose-Capillary Latest Ref Range: 65 - 99 mg/dL 952144 (H) 841158 (H) 324500 (H) 447 (H) 171 (H) 175 (H)   Inpatient Diabetes Program Recommendations:    Blood sugars improved this morning. Please consider reducing Novolog correction to sensitive.  Also consider increasing Lantus to 5 units daily.   Thanks, Beryl MeagerJenny Ruvim Risko, RN, BC-ADM Inpatient Diabetes Coordinator Pager [phone] (8a-5p)

## 2016-12-04 NOTE — Care Management Important Message (Signed)
Important Message  Patient Details  Name: Cassandra [name] MRN: [id number] Date of Birth: [birth year]   Medicare Important Message Given:  Yes    Marily MemosLisa M Ramirez Fullbright, RN 12/04/2016, 11:33 AM

## 2016-12-04 NOTE — Progress Notes (Signed)
RN spoke to [facility]. All questions answered. EMS called for transport. Vitals stable. Patient denies pain.   Cassandra HeckMelanie Geanie Pacifico

## 2016-12-05 ENCOUNTER — Non-Acute Institutional Stay (SKILLED_NURSING_FACILITY): Payer: Medicare Other | Admitting: Internal Medicine

## 2016-12-05 ENCOUNTER — Encounter: Payer: Self-pay | Admitting: Internal Medicine

## 2016-12-05 DIAGNOSIS — R531 Weakness: Secondary | ICD-10-CM

## 2016-12-05 DIAGNOSIS — N183 Chronic kidney disease, stage 3 unspecified: Secondary | ICD-10-CM

## 2016-12-05 DIAGNOSIS — E871 Hypo-osmolality and hyponatremia: Secondary | ICD-10-CM

## 2016-12-05 DIAGNOSIS — D638 Anemia in other chronic diseases classified elsewhere: Secondary | ICD-10-CM | POA: Diagnosis not present

## 2016-12-05 DIAGNOSIS — IMO0002 Reserved for concepts with insufficient information to code with codable children: Secondary | ICD-10-CM

## 2016-12-05 DIAGNOSIS — Z794 Long term (current) use of insulin: Secondary | ICD-10-CM

## 2016-12-05 DIAGNOSIS — K219 Gastro-esophageal reflux disease without esophagitis: Secondary | ICD-10-CM

## 2016-12-05 DIAGNOSIS — N3000 Acute cystitis without hematuria: Secondary | ICD-10-CM

## 2016-12-05 DIAGNOSIS — E039 Hypothyroidism, unspecified: Secondary | ICD-10-CM

## 2016-12-05 DIAGNOSIS — D72825 Bandemia: Secondary | ICD-10-CM

## 2016-12-05 DIAGNOSIS — I129 Hypertensive chronic kidney disease with stage 1 through stage 4 chronic kidney disease, or unspecified chronic kidney disease: Secondary | ICD-10-CM

## 2016-12-05 DIAGNOSIS — S12590S Other displaced fracture of sixth cervical vertebra, sequela: Secondary | ICD-10-CM

## 2016-12-05 DIAGNOSIS — E440 Moderate protein-calorie malnutrition: Secondary | ICD-10-CM

## 2016-12-05 DIAGNOSIS — E1165 Type 2 diabetes mellitus with hyperglycemia: Secondary | ICD-10-CM

## 2016-12-05 DIAGNOSIS — Z8673 Personal history of transient ischemic attack (TIA), and cerebral infarction without residual deficits: Secondary | ICD-10-CM

## 2016-12-05 DIAGNOSIS — F32A Depression, unspecified: Secondary | ICD-10-CM

## 2016-12-05 DIAGNOSIS — F329 Major depressive disorder, single episode, unspecified: Secondary | ICD-10-CM

## 2016-12-05 LAB — URINE CULTURE

## 2016-12-05 NOTE — Progress Notes (Signed)
LOCATION: [facility]  PCP: Irving CopasHACKER,ROBERT KELLER, MD   Code Status: DNR  Goals of care: Advanced Directive information Advanced Directives 12/05/2016  Does Patient Have a Medical Advance Directive? Yes  Type of Advance Directive Out of facility DNR (pink MOST or yellow form)  Does patient want to make changes to medical advance directive? No - Patient declined  Copy of Healthcare Power of Attorney in Chart? -  Would patient like information on creating a medical advance directive? -       Extended Emergency Contact Information Primary Emergency Contact: King,[name] J Address: [address]          [place], [place] [address] [place] Mobile Phone: [phone] Relation: Daughter Secondary Emergency Contact: Benedetto CoonsSimmons,Keeley  [place] Mobile Phone: [phone] Relation: Daughter   Allergies  Allergen Reactions  . Alprazolam Other (See Comments)    Family preference, for patient to not take med  . Percocet [Oxycodone-Acetaminophen] Other (See Comments)    Family preference, for patient to not take med  . Codeine Diarrhea and Nausea And Vomiting  . Doxycycline Diarrhea and Nausea And Vomiting  . Hydrocodone Nausea And Vomiting  . Omnicef [Cefdinir] Nausea Only and Other (See Comments)    Constipation, tolerated Zosyn  . Augmentin [Amoxicillin-Pot Clavulanate] Hives and Rash    Has patient had a PCN reaction causing immediate rash, facial/tongue/throat swelling, SOB or lightheadedness with hypotension: Yes Has patient had a PCN reaction causing severe rash involving mucus membranes or skin necrosis: Yes Did a PCN reaction that required hospitalization No Did PCN reaction occurring within the last 10 years: Yes If all of the above answers are "NO", then may proceed with Cephalosporin use.  Pt states she has taken penicillin since, and was ok with it...  . Ciprofloxacin Hives    Chief Complaint  Patient presents with  . Readmit To  SNF    Readmission Visit      HPI:  Patient is a 70 y.o. female seen today for short term rehabilitation post hospital re-admission from 12/02/16-12/04/16 with nausea and hypoglycemia. She required dextrose push and was started on antibiotic for urinary tract infection.  She was at this facility undergoing rehabilitation post hospital admission from 10/16/16-11/01/16 post fall with Cervical vertebrae fracture, AKI and c.diff colitis. She has PMH of diabetes, hypothyroidism, CVA, ckd stage 3, anemia of chronic disease among others. She is seen in her room today.    Review of Systems:  Constitutional: Negative for fever, chills, diaphoresis.  HENT: Negative for congestion, nasal discharge, hearing loss, sore throat, difficulty swallowing. Positive for occasional headaches.   Eyes: Negative for blurred vision, double vision and discharge.  Respiratory: Negative for cough, shortness of breath and wheezing.   Cardiovascular: Negative for chest pain, palpitations, leg swelling.  Gastrointestinal: Negative for heartburn, nausea, vomiting, abdominal pain. Positive for poor appetite. Last bowel movement was yesterday.   Genitourinary: Positive for discomfort with urination.   Musculoskeletal: Negative for back pain, fall in the facility.  Skin: Negative for itching, rash.  Neurological: Negative for dizziness. Psychiatric/Behavioral: Negative for depression   Past Medical History:  Diagnosis Date  . Allergic rhinitis   . Anemia   . Anxiety   . Arthritis    "mostly feet, hands" (10/17/2016)  . Benign hypertension with CKD (chronic kidney disease) stage III   . Benign paroxysmal positional vertigo   . Bipolar disorder (HCC)   . Broken finger   . Broken shoulder   . Broken  toes   . Cervicalgia   . CHF (congestive heart failure) (HCC) 07/2016  . CKD (chronic kidney disease) stage 3, GFR 30-59 ml/min   . COPD (chronic obstructive pulmonary disease) (HCC)   . Depression   . Elevated liver  enzymes Hep B/C neg 2014  . Fall at nursing home 10/15/2016  . GERD (gastroesophageal reflux disease)   . Glaucoma   . High cholesterol   . History of alcohol use   . History of blood transfusion 09/2016   "blood got really really low"  . Hypertension   . Hypothyroidism   . Interstitial cystitis    bladder stretched every 9 months  . Migraines    "pretty much qd" (10/17/2016)  . Psoriasis   . Stroke (HCC) 05/16/2016   Left occipital and thalamic, right hippocampal  . Thyroid disease   . Tobacco use   . 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 Vitamin B12 deficiency    Past Surgical History:  Procedure Laterality Date  . ANTERIOR CERVICAL DECOMP/DISCECTOMY FUSION  ~ 2009  . APPENDECTOMY    . BACK SURGERY    . BLADDER SUSPENSION    . CATARACT EXTRACTION W/ INTRAOCULAR LENS  IMPLANT, BILATERAL Bilateral   . DILATION AND CURETTAGE OF UTERUS    . HERNIA REPAIR    . LAPAROSCOPIC CHOLECYSTECTOMY    . TONSILLECTOMY    . TUBAL LIGATION    . VAGINAL HYSTERECTOMY     with oophorectomy   Social History:   reports that she quit smoking about 93 years ago. Her smoking use included Cigarettes. She has a 2.50 pack-year smoking history. She has never used smokeless tobacco. She reports that she does not drink alcohol or use drugs.  Family History  Problem Relation Age of Onset  . Alcohol abuse Mother   . Arthritis Mother   . Asthma Mother   . Cancer Mother     colon cancer  . Hypertension Mother   . Migraines Mother   . Stroke Mother   . Lung disease Mother   . COPD Mother   . Diabetes Father   . Hypertension Father   . Heart disease Father   . Heart attack Father   . Heart disease Paternal Grandmother   . Diabetes Paternal Grandmother   . Stroke Paternal Grandmother   . Cancer Paternal Grandmother   . Diabetes Paternal Grandfather     Medications: Allergies as of 12/05/2016      Reactions   Alprazolam Other (See Comments)   Family preference, for  patient to not take med   Percocet [oxycodone-acetaminophen] Other (See Comments)   Family preference, for patient to not take med   Codeine Diarrhea, Nausea And Vomiting   Doxycycline Diarrhea, Nausea And Vomiting   Hydrocodone Nausea And Vomiting   Omnicef [cefdinir] Nausea Only, Other (See Comments)   Constipation, tolerated Zosyn   Augmentin [amoxicillin-pot Clavulanate] Hives, Rash   Has patient had a PCN reaction causing immediate rash, facial/tongue/throat swelling, SOB or lightheadedness with hypotension: Yes Has patient had a PCN reaction causing severe rash involving mucus membranes or skin necrosis: Yes Did a PCN reaction that required hospitalization No Did PCN reaction occurring within the last 10 years: Yes If all of the above answers are "NO", then may proceed with Cephalosporin use. Pt states she has taken penicillin since, and was ok with it...   Ciprofloxacin Hives      Medication List       Accurate  as of 12/05/16 11:59 PM. Always use your most recent med list.          acetaminophen 325 MG tablet Commonly known as:  TYLENOL Take 650 mg by mouth every 6 (six) hours as needed for mild pain or fever.   amLODipine 5 MG tablet Commonly known as:  NORVASC Take 5 mg by mouth daily.   aspirin 81 MG chewable tablet Chew 1 tablet (81 mg total) by mouth daily.   atorvastatin 10 MG tablet Commonly known as:  LIPITOR Take 1 tablet (10 mg total) by mouth daily at 6 PM.   carvedilol 3.125 MG tablet Commonly known as:  COREG Take 1 tablet (3.125 mg total) by mouth 2 (two) times daily with a meal.   cephALEXin 500 MG capsule Commonly known as:  KEFLEX Take 500 mg by mouth 2 (two) times daily.   clopidogrel 75 MG tablet Commonly known as:  PLAVIX Take 75 mg by mouth daily.   escitalopram 20 MG tablet Commonly known as:  LEXAPRO Take 20 mg by mouth at bedtime.   famotidine 20 MG tablet Commonly known as:  PEPCID Take 1 tablet (20 mg total) by mouth daily.    feeding supplement (PRO-STAT SUGAR FREE 64) Liqd Take 30 mLs by mouth 2 (two) times daily.   insulin lispro 100 UNIT/ML injection Commonly known as:  HUMALOG Inject 0.01-0.05 mLs (1-5 Units total) into the skin 3 (three) times daily before meals. For CBGs < 150 = 0 units; 151 - 200 = 1 units, 201 - 250 = 2 units, 251 - 300 = 3 units; 301-350 = 4 units; 351-400 = 5 units; > 400 notify the provider.   iron polysaccharides 150 MG capsule Commonly known as:  NIFEREX Take 1 capsule (150 mg total) by mouth 2 (two) times daily.   levothyroxine 112 MCG tablet Commonly known as:  SYNTHROID, LEVOTHROID Take 112 mcg by mouth daily before breakfast.   meclizine 25 MG tablet Commonly known as:  ANTIVERT Take 1 tablet (25 mg total) by mouth 3 (three) times daily as needed for dizziness.   NUTRITIONAL SUPPLEMENT Liqd Give Med Pass 2.0  Sugar free 120 mls by mouth three times daily between meals as a feeding supplement.   nystatin-triamcinolone ointment Commonly known as:  MYCOLOG Apply ointment every shift to perianal / buttocks for rash   ondansetron 4 MG disintegrating tablet Commonly known as:  ZOFRAN ODT Take 1 tablet (4 mg total) by mouth every 8 (eight) hours as needed for nausea or vomiting.   traMADol 50 MG tablet Commonly known as:  ULTRAM Take 1 tablet (50 mg total) by mouth every 6 (six) hours as needed.       Immunizations: Immunization History  Administered Date(s) Administered  . Pneumococcal Polysaccharide-23 12/16/2008  . Td 12/16/2009     Physical Exam:  Vitals:   12/05/16 1317  BP: 140/67  Pulse: 78  Resp: 20  Temp: 98.3 F (36.8 C)  TempSrc: Oral  SpO2: 96%  Weight: 124 lb 9.6 oz (56.5 kg)  Height: 5\' 2"  (1.575 m)   Body mass index is 22.79 kg/m.   General- elderly female, well built, in no acute distress Head- normocephalic, atraumatic Nose- no maxillary or frontal sinus tenderness, no nasal discharge Throat- moist mucus membrane Eyes- PERRLA,  EOMI, no pallor, no icterus, no discharge Neck- no cervical lymphadenopathy, has cervical collar in place Cardiovascular- normal s1,s2, no murmur Respiratory- bilateral clear to auscultation, no wheeze, no rhonchi, no crackles, no use  of accessory muscles Abdomen- bowel sounds present, soft, non tender, no guarding or rigidity, no CVA tenderness Musculoskeletal- able to move all 4 extremities, generalized weakness Neurological- alert and oriented to person, place and month but not to year Skin- warm and dry Psychiatry- normal mood and affect    Labs reviewed: Basic Metabolic Panel:  Recent Labs  16/10/96 0403  06/25/16 0308  09/20/16 1830  10/21/16 0552 11/04/16 12/02/16 2004 12/03/16 0736  NA 132*  < > 135  < > 136  < > 135 141 139 134*  K 4.3  < > 3.7  < > 3.7  < > 4.6 4.4 3.6 3.6  CL 104  < > 112*  < > 107  < > 103  --  109 102  CO2 10*  < > 16*  < > 20*  < > 22  --  24 22  GLUCOSE 531*  < > 138*  < > 147*  < > 385*  --  56* 343*  BUN 38*  < > 33*  < > 58*  < > 12 26* 33* 31*  CREATININE 2.55*  < > 2.13*  < > 3.23*  < > 1.41* 1.4* 1.82* 1.65*  CALCIUM 7.2*  < > 7.1*  < > 8.6*  < > 7.7*  --  7.9* 7.9*  MG 1.8  --  1.6*  --  1.6*  --   --   --   --   --   PHOS 3.6  --  2.0*  --  4.0  --   --   --   --   --   < > = values in this interval not displayed. Liver Function Tests:  Recent Labs  10/16/16 1055 10/18/16 0316 12/02/16 2004  AST 33 18 24  ALT 39 19 15  ALKPHOS 154* 85 82  BILITOT 0.7 0.1* 0.6  PROT 6.0* 3.5* 4.6*  ALBUMIN 2.4* 1.4* 1.8*    Recent Labs  05/10/16 0359  09/20/16 1233 10/01/16 1454 12/02/16 2004  LIPASE 21  < > 32 42 18  AMYLASE 55  --   --   --   --   < > = values in this interval not displayed.  Recent Labs  05/06/16 1113  AMMONIA 61*   CBC:  Recent Labs  09/28/16 1720  10/16/16 1055  10/21/16 0552 12/02/16 2004 12/03/16 0736  WBC 6.5  < > 15.2*  < > 9.0 12.3* 12.0*  NEUTROABS 4.1  --  13.5*  --   --  8.7*  --   HGB 9.2*   < > 13.8  < > 9.1* 8.9* 10.6*  HCT 27.2*  < > 40.6  < > 27.8* 26.5* 31.4*  MCV 90.7  < > 89.4  < > 89.7 91.8 91.7  PLT 260  < > 340  < > 298 290 346  < > = values in this interval not displayed. Cardiac Enzymes:  Recent Labs  09/28/16 1720 10/02/16 1446 12/02/16 2004  TROPONINI <0.03 <0.03 <0.03   BNP: Invalid input(s): POCBNP CBG:  Recent Labs  12/03/16 2125 12/04/16 0718 12/04/16 1150  GLUCAP 80 175* 172*    Radiological Exams: Dg Chest Portable 1 View  Result Date: 12/02/2016 CLINICAL DATA:  Nausea for 2 days.  Known cervical fracture. EXAM: PORTABLE CHEST 1 VIEW COMPARISON:  Chest radiograph 10/16/2016 FINDINGS: Cervical collar obscures evaluation of the upper chest. Cardiomediastinal contours are within normal limits. No focal airspace consolidation or pulmonary edema. Suspect  small bilateral pleural effusions. No pneumothorax. IMPRESSION: Suspected small bilateral pleural effusions. No focal airspace disease. Electronically Signed   By: Deatra Robinson M.D.   On: 12/02/2016 20:58    Assessment/Plan  Generalized weakness Will have her work with physical therapy and occupational therapy team to help with gait training and muscle strengthening exercises.fall precautions. Skin care. Encourage to be out of bed.   UTI Continue and complete course of keflex 500 mg bid until 12/09/16. Maintain hydration  Leukocytosis Afebrile. Currently on antibiotic for UTI. Monitor wbc  Anemia of chronic disease Continue niferex and monitor cbc  Hyponatremia Monitor bmp  ckd stage 3 Monitor her BMP  S/p C6-T1 fracture Has cervical collar in place for fracture from c6-T1. Continue current regimen of tylenol and tramadol, monitor  Hypothyroidism Lab Results  Component Value Date   TSH 180.000 (H) 12/03/2016   Currently on levothyroxine 112 mcg daily. Check tsh in 4 weeks  Dm type 2 Lab Results  Component Value Date   HGBA1C 7.5 11/18/2016   HGBA1C 7.5 11/18/2016    Monitor cbg, continue SSI humalog and monitor. Continue aspirin and lipitor.   Protein calorie malnutrition Continue medpass and prostat and monitor weight  gerd Continue famotidine 20 mg daily.   Hypertension Monitor bp, continue norvasc and carvedilol with baby aspirin  History of CVA Continue aspirin and plavix with her statin, monitor bp  Chronic depression Continue escitalopram   Goals of care: short term rehabilitation   Labs/tests ordered: cbc, bmp 12/10/16  Family/ staff Communication: reviewed care plan with patient and nursing supervisor    Oneal Grout, MD Internal Medicine [facility] [address] [place], [place] [address] Cell Phone (Monday-Friday 8 am - 5 pm): [phone] On Call: [phone] and follow prompts after 5 pm and on weekends Office Phone: [phone] Office Fax: [phone]

## 2016-12-06 LAB — GLUCOSE, CAPILLARY: Glucose-Capillary: 80 mg/dL (ref 65–99)

## 2016-12-11 ENCOUNTER — Emergency Department: Payer: Medicare Other

## 2016-12-11 ENCOUNTER — Emergency Department
Admission: EM | Admit: 2016-12-11 | Discharge: 2016-12-12 | Disposition: A | Discharge status: Home / Self Care | Payer: Medicare Other | Attending: Emergency Medicine | Admitting: Emergency Medicine

## 2016-12-11 DIAGNOSIS — Z7982 Long term (current) use of aspirin: Secondary | ICD-10-CM | POA: Diagnosis not present

## 2016-12-11 DIAGNOSIS — Z87891 Personal history of nicotine dependence: Secondary | ICD-10-CM | POA: Diagnosis not present

## 2016-12-11 DIAGNOSIS — N183 Chronic kidney disease, stage 3 (moderate): Secondary | ICD-10-CM | POA: Insufficient documentation

## 2016-12-11 DIAGNOSIS — E039 Hypothyroidism, unspecified: Secondary | ICD-10-CM | POA: Insufficient documentation

## 2016-12-11 DIAGNOSIS — J449 Chronic obstructive pulmonary disease, unspecified: Secondary | ICD-10-CM | POA: Insufficient documentation

## 2016-12-11 DIAGNOSIS — R5383 Other fatigue: Secondary | ICD-10-CM | POA: Diagnosis not present

## 2016-12-11 DIAGNOSIS — R9082 White matter disease, unspecified: Secondary | ICD-10-CM | POA: Insufficient documentation

## 2016-12-11 DIAGNOSIS — E1122 Type 2 diabetes mellitus with diabetic chronic kidney disease: Secondary | ICD-10-CM | POA: Diagnosis not present

## 2016-12-11 DIAGNOSIS — I5032 Chronic diastolic (congestive) heart failure: Secondary | ICD-10-CM | POA: Diagnosis not present

## 2016-12-11 DIAGNOSIS — R531 Weakness: Secondary | ICD-10-CM | POA: Insufficient documentation

## 2016-12-11 DIAGNOSIS — Z794 Long term (current) use of insulin: Secondary | ICD-10-CM | POA: Diagnosis not present

## 2016-12-11 DIAGNOSIS — I130 Hypertensive heart and chronic kidney disease with heart failure and stage 1 through stage 4 chronic kidney disease, or unspecified chronic kidney disease: Secondary | ICD-10-CM | POA: Insufficient documentation

## 2016-12-11 DIAGNOSIS — Z79899 Other long term (current) drug therapy: Secondary | ICD-10-CM | POA: Diagnosis not present

## 2016-12-11 LAB — URINALYSIS, ROUTINE W REFLEX MICROSCOPIC
BACTERIA UA: NONE SEEN
BILIRUBIN URINE: NEGATIVE
HGB URINE DIPSTICK: NEGATIVE
KETONES UR: 5 mg/dL — AB
LEUKOCYTES UA: NEGATIVE
NITRITE: NEGATIVE
SQUAMOUS EPITHELIAL / LPF: NONE SEEN
Specific Gravity, Urine: 1.013 (ref 1.005–1.030)
pH: 5 (ref 5.0–8.0)

## 2016-12-11 LAB — MAGNESIUM: MAGNESIUM: 2.2 mg/dL (ref 1.7–2.4)

## 2016-12-11 LAB — COMPREHENSIVE METABOLIC PANEL
ALK PHOS: 84 U/L (ref 38–126)
ALT: 16 U/L (ref 14–54)
ANION GAP: 8 (ref 5–15)
AST: 27 U/L (ref 15–41)
Albumin: 1.9 g/dL — ABNORMAL LOW (ref 3.5–5.0)
BUN: 48 mg/dL — ABNORMAL HIGH (ref 6–20)
CALCIUM: 8.1 mg/dL — AB (ref 8.9–10.3)
CO2: 24 mmol/L (ref 22–32)
CREATININE: 1.45 mg/dL — AB (ref 0.44–1.00)
Chloride: 107 mmol/L (ref 101–111)
GFR, EST AFRICAN AMERICAN: 42 mL/min — AB (ref >60)
GFR, EST NON AFRICAN AMERICAN: 36 mL/min — AB (ref >60)
Glucose, Bld: 97 mg/dL (ref 65–99)
Potassium: 3.8 mmol/L (ref 3.5–5.1)
SODIUM: 139 mmol/L (ref 135–145)
TOTAL PROTEIN: 5.1 g/dL — AB (ref 6.5–8.1)
Total Bilirubin: 0.2 mg/dL — ABNORMAL LOW (ref 0.3–1.2)

## 2016-12-11 LAB — CBC WITH DIFFERENTIAL/PLATELET
Basophils Absolute: 0.1 10*3/uL (ref 0–0.1)
Basophils Relative: 1 %
EOS ABS: 0.2 10*3/uL (ref 0–0.7)
EOS PCT: 3 %
HCT: 26.8 % — ABNORMAL LOW (ref 35.0–47.0)
HEMOGLOBIN: 9.3 g/dL — AB (ref 12.0–16.0)
Lymphocytes Relative: 31 %
Lymphs Abs: 2.1 10*3/uL (ref 1.0–3.6)
MCH: 31.2 pg (ref 26.0–34.0)
MCHC: 34.7 g/dL (ref 32.0–36.0)
MCV: 90 fL (ref 80.0–100.0)
MONOS PCT: 9 %
Monocytes Absolute: 0.6 10*3/uL (ref 0.2–0.9)
NEUTROS ABS: 3.9 10*3/uL (ref 1.4–6.5)
Neutrophils Relative %: 56 %
Platelets: 300 10*3/uL (ref 150–440)
RBC: 2.98 MIL/uL — ABNORMAL LOW (ref 3.80–5.20)
RDW: 14.9 % — ABNORMAL HIGH (ref 11.5–14.5)
WBC: 6.8 10*3/uL (ref 3.6–11.0)

## 2016-12-11 LAB — TROPONIN I

## 2016-12-11 NOTE — ED Triage Notes (Signed)
Per EMS, [facility] is [facility] and was diagnosed with UTI, pt finished antibiotics however has since not felt any better with "increased weakness and less talkative." Pt A&O, has hx of neck fracture and is in a neck brace. EMS VS: 139/72, HR 68, 97% on RA.

## 2016-12-11 NOTE — ED Notes (Signed)
Pt returned from CT °

## 2016-12-11 NOTE — Discharge Instructions (Signed)
As we discussed, your workup today was reassuring.  Though we do not know exactly what is causing your symptoms, it appears that you have no emergent medical condition at this time and that you are safe to go home and follow up as recommended in this paperwork. ° °Please return immediately to the Emergency Department if you develop any new or worsening symptoms that concern you. ° °

## 2016-12-11 NOTE — ED Provider Notes (Signed)
[facility] Emergency Department Provider Note  ____________________________________________   First MD Initiated Contact with Patient 12/11/16 2002     (approximate)  I have reviewed the triage vital signs and the nursing notes.   HISTORY  Chief Complaint Weakness  Level V caveat: History is limited by the patient being a poor historian and limited cooperation with H&P  HPI Cassandra [name] is a 70 y.o. female who was diagnosed with a UTI as the 2 weeks ago and completed a course of treatment.  However she has been declining in her overall functional status with decreased activity, increased generalized weakness, and less talkative than usual.  Over the last 3 days she has also been complaining of a generalized headache and increased neck pain; she was diagnosed with a neck fracture about 2 months ago and is in a long-term neck brace.  She has had no other new injuries of which anyone is aware.  She only will report that she does not feel well, denies any specific abnormality, and just states that she does not feel well in general.  She does agree that she has a headache and some neck pain.  Overall her symptoms are moderate in severity and nothing in particular makes it better nor worse.Her daughters are present with her in the ED and are concerned she may be developing another urinary tract infection.   Past Medical History:  Diagnosis Date  . Allergic rhinitis   . Anemia   . Anxiety   . Arthritis    "mostly feet, hands" (10/17/2016)  . Benign hypertension with CKD (chronic kidney disease) stage III   . Benign paroxysmal positional vertigo   . Bipolar disorder (HCC)   . Broken finger   . Broken shoulder   . Broken toes   . Cervicalgia   . CHF (congestive heart failure) (HCC) 07/2016  . CKD (chronic kidney disease) stage 3, GFR 30-59 ml/min   . COPD (chronic obstructive pulmonary disease) (HCC)   . Depression   . Elevated liver enzymes Hep B/C neg  2014  . Fall at nursing home 10/15/2016  . GERD (gastroesophageal reflux disease)   . Glaucoma   . High cholesterol   . History of alcohol use   . History of blood transfusion 09/2016   "blood got really really low"  . Hypertension   . Hypothyroidism   . Interstitial cystitis    bladder stretched every 9 months  . Migraines    "pretty much qd" (10/17/2016)  . Psoriasis   . Stroke (HCC) 05/16/2016   Left occipital and thalamic, right hippocampal  . Thyroid disease   . Tobacco use   . 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 Vitamin B12 deficiency     Patient Active Problem List   Diagnosis Date Noted  . UTI (urinary tract infection) 12/03/2016  . Hypoglycemia 12/03/2016  . Palliative care encounter   . Goals of care, counseling/discussion   . Cervical vertebral fracture (HCC) 10/17/2016  . Thoracic vertebral fracture (HCC) 10/17/2016  . Fall at nursing home 10/17/2016  . Common bile duct dilatation 10/17/2016  . Closed fracture of cervical vertebra (HCC)   . Recent Gram-negative bacteremia 10/07/2016  . Gastroesophageal reflux disease   . DKA, type 1 (HCC) 09/06/2016  . Protein-calorie malnutrition, severe 08/28/2016  . Chronic diastolic heart failure (HCC) 07/19/2016  . Dehydration   . Cerebral embolism with cerebral infarction 07/13/2016  . Left ventricular diastolic dysfunction, NYHA class  1 07/11/2016  . History of CVA (cerebrovascular accident)   . HLD (hyperlipidemia)   . Anemia of chronic disease   . Physical deconditioning   . Acute on chronic kidney failure (HCC)   . Diabetic ketoacidosis with coma associated with type 1 diabetes mellitus (HCC)   . Cerebral thrombosis with cerebral infarction 05/15/2016  . Generalized anxiety disorder 05/10/2016  . Diabetic ketoacidosis without coma associated with type 1 diabetes mellitus (HCC) 05/06/2016  . Insulin dependent type 2 diabetes mellitus, uncontrolled (HCC)   . Depression   . CKD (chronic  kidney disease) stage 3, GFR 30-59 ml/min   . Uncontrolled Hypothyroidism   . Vitamin B12 deficiency   . Benign paroxysmal positional vertigo   . Anxiety   . Allergic rhinitis   . Glaucoma   . Benign hypertension with CKD (chronic kidney disease) stage III   . Tobacco abuse   . Cervicalgia   . Elevated liver enzymes   . History of alcohol use     Past Surgical History:  Procedure Laterality Date  . ANTERIOR CERVICAL DECOMP/DISCECTOMY FUSION  ~ 2009  . APPENDECTOMY    . BACK SURGERY    . BLADDER SUSPENSION    . CATARACT EXTRACTION W/ INTRAOCULAR LENS  IMPLANT, BILATERAL Bilateral   . DILATION AND CURETTAGE OF UTERUS    . HERNIA REPAIR    . LAPAROSCOPIC CHOLECYSTECTOMY    . TONSILLECTOMY    . TUBAL LIGATION    . VAGINAL HYSTERECTOMY     with oophorectomy    Prior to Admission medications   Medication Sig Start Date End Date Taking? Authorizing Provider  acetaminophen (TYLENOL) 325 MG tablet Take 650 mg by mouth every 6 (six) hours as needed for mild pain or fever.     Historical Provider, MD  Amino Acids-Protein Hydrolys (FEEDING SUPPLEMENT, PRO-STAT SUGAR FREE 64,) LIQD Take 30 mLs by mouth 2 (two) times daily.    Historical Provider, MD  amLODipine (NORVASC) 5 MG tablet Take 5 mg by mouth daily.    Historical Provider, MD  aspirin 81 MG chewable tablet Chew 1 tablet (81 mg total) by mouth daily. 09/24/16   Vassie Lollarlos Madera, MD  atorvastatin (LIPITOR) 10 MG tablet Take 1 tablet (10 mg total) by mouth daily at 6 PM. 05/19/16   Rolly SalterPranav M Patel, MD  carvedilol (COREG) 3.125 MG tablet Take 1 tablet (3.125 mg total) by mouth 2 (two) times daily with a meal. 07/18/16   Mauricio Annett Gulaaniel Arrien, MD  cephALEXin (KEFLEX) 500 MG capsule Take 500 mg by mouth 2 (two) times daily.    Historical Provider, MD  clopidogrel (PLAVIX) 75 MG tablet Take 75 mg by mouth daily.    Historical Provider, MD  escitalopram (LEXAPRO) 20 MG tablet Take 20 mg by mouth at bedtime.     Historical Provider, MD    famotidine (PEPCID) 20 MG tablet Take 1 tablet (20 mg total) by mouth daily. 10/22/16   Osvaldo ShipperGokul Krishnan, MD  insulin lispro (HUMALOG) 100 UNIT/ML injection Inject 0.01-0.05 mLs (1-5 Units total) into the skin 3 (three) times daily before meals. For CBGs < 150 = 0 units; 151 - 200 = 1 units, 201 - 250 = 2 units, 251 - 300 = 3 units; 301-350 = 4 units; 351-400 = 5 units; > 400 notify the provider. 11/21/16   Dinah C Ngetich, NP  iron polysaccharides (NIFEREX) 150 MG capsule Take 1 capsule (150 mg total) by mouth 2 (two) times daily. 06/28/16   Vassie Lollarlos Madera, MD  levothyroxine (SYNTHROID, LEVOTHROID) 112 MCG tablet Take 112 mcg by mouth daily before breakfast.    Historical Provider, MD  meclizine (ANTIVERT) 25 MG tablet Take 1 tablet (25 mg total) by mouth 3 (three) times daily as needed for dizziness. 02/12/16   Chilton Si, MD  NUTRITIONAL SUPPLEMENT LIQD Give Med Pass 2.0  Sugar free 120 mls by mouth three times daily between meals as a feeding supplement.    Historical Provider, MD  nystatin-triamcinolone ointment (MYCOLOG) Apply ointment every shift to perianal / buttocks for rash    Historical Provider, MD  ondansetron (ZOFRAN ODT) 4 MG disintegrating tablet Take 1 tablet (4 mg total) by mouth every 8 (eight) hours as needed for nausea or vomiting. 12/02/16   Sharman Cheek, MD  traMADol (ULTRAM) 50 MG tablet Take 1 tablet (50 mg total) by mouth every 6 (six) hours as needed. 12/04/16   Wyatt Haste, MD    Allergies Alprazolam; Percocet [oxycodone-acetaminophen]; Codeine; Doxycycline; Hydrocodone; Omnicef [cefdinir]; Augmentin [amoxicillin-pot clavulanate]; and Ciprofloxacin  Family History  Problem Relation Age of Onset  . Alcohol abuse Mother   . Arthritis Mother   . Asthma Mother   . Cancer Mother     colon cancer  . Hypertension Mother   . Migraines Mother   . Stroke Mother   . Lung disease Mother   . COPD Mother   . Diabetes Father   . Hypertension Father   . Heart disease  Father   . Heart attack Father   . Heart disease Paternal Grandmother   . Diabetes Paternal Grandmother   . Stroke Paternal Grandmother   . Cancer Paternal Grandmother   . Diabetes Paternal Grandfather     Social History Social History  Substance Use Topics  . Smoking status: Former Smoker    Packs/day: 0.25    Years: 10.00    Types: Cigarettes    Quit date: 12/1923  . Smokeless tobacco: Never Used  . Alcohol use No     Comment: Hasn't had any alcohol "for 2 yrs" (10/17/2016)    Review of Systems L5 caveat: Unable to obtain due to poor historian and poor participation in exam ____________________________________________   PHYSICAL EXAM:  VITAL SIGNS: ED Triage Vitals  Enc Vitals Group     BP 12/11/16 1926 135/70     Pulse Rate 12/11/16 1926 65     Resp 12/11/16 1926 18     Temp 12/11/16 1926 97.9 F (36.6 C)     Temp Source 12/11/16 1926 Oral     SpO2 12/11/16 1926 99 %     Weight 12/11/16 1927 124 lb (56.2 kg)     Height 12/11/16 1927 5\' 2"  (1.575 m)     Head Circumference --      Peak Flow --      Pain Score 12/11/16 1927 8     Pain Loc --      Pain Edu? --      Excl. in GC? --     Constitutional: Alert. Appears debilitated and chronically ill, appears much older than chronological age Eyes: Conjunctivae are normal. PERRL. EOMI. Head: Atraumatic. Nose: No congestion/rhinnorhea. Mouth/Throat: Mucous membranes are moist.  Oropharynx non-erythematous. Neck: Long-term c-collar is in place.  No stridor.  No meningeal signs.   Cardiovascular: Normal rate, regular rhythm. Good peripheral circulation. Grossly normal heart sounds. Respiratory: Normal respiratory effort.  No retractions. Lungs CTAB. Gastrointestinal: Soft and nontender. No distention.  Musculoskeletal: No lower extremity tenderness nor edema. No gross deformities  of extremities. Neurologic:  Normal speech and language. No gross focal neurologic deficits are appreciated.  Skin:  Skin is warm, dry and  intact. No rash noted.   ____________________________________________   LABS (all labs ordered are listed, but only abnormal results are displayed)  Labs Reviewed  CBC WITH DIFFERENTIAL/PLATELET - Abnormal; Notable for the following:       Result Value   RBC 2.98 (*)    Hemoglobin 9.3 (*)    HCT 26.8 (*)    RDW 14.9 (*)    All other components within normal limits  COMPREHENSIVE METABOLIC PANEL - Abnormal; Notable for the following:    BUN 48 (*)    Creatinine, Ser 1.45 (*)    Calcium 8.1 (*)    Total Protein 5.1 (*)    Albumin 1.9 (*)    Total Bilirubin 0.2 (*)    GFR calc non Af Amer 36 (*)    GFR calc Af Amer 42 (*)    All other components within normal limits  URINALYSIS, ROUTINE W REFLEX MICROSCOPIC - Abnormal; Notable for the following:    Color, Urine YELLOW (*)    APPearance HAZY (*)    Glucose, UA >=500 (*)    Ketones, ur 5 (*)    Protein, ur >=300 (*)    All other components within normal limits  MAGNESIUM  TROPONIN I   ____________________________________________  EKG  ED ECG REPORT I, Breckin Savannah, the attending physician, personally viewed and interpreted this ECG.  Date: 12/11/2016 EKG Time: 20:05 Rate: 66 Rhythm: normal sinus rhythm QRS Axis: normal Intervals: normal ST/T Wave abnormalities: normal Conduction Disturbances: none Narrative Interpretation: unremarkable  ____________________________________________  RADIOLOGY   Ct Head Wo Contrast  Result Date: 12/11/2016 CLINICAL DATA:  Worsening neck pain. History of cervical spine fracture. EXAM: CT HEAD WITHOUT CONTRAST CT CERVICAL SPINE WITHOUT CONTRAST TECHNIQUE: Multidetector CT imaging of the head and cervical spine was performed following the standard protocol without intravenous contrast. Multiplanar CT image reconstructions of the cervical spine were also generated. COMPARISON:  CT scan of October 26, 2016. FINDINGS: CT HEAD FINDINGS Brain: Mild chronic ischemic white matter disease  is noted. 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 No mass effect or midline shift is noted. Ventricular size is within normal limits. There is no evidence of mass lesion, hemorrhage or acute infarction. Vascular: Atherosclerosis of carotid siphons is noted. Skull: Normal. Negative for fracture or focal lesion. Sinuses/Orbits: No acute finding. Other: None. CT CERVICAL SPINE FINDINGS Alignment: Minimal grade 1 anterolisthesis of C3-4 and C4-5 is noted secondary to posterior facet joint hypertrophy. Status post surgical anterior fusion of C5-6 with interbody fusion. Skull base and vertebrae: Stable fracture line is seen involving anterior osteophyte of C6-7. Persistent fracture line is seen extending through the anterior aspect of C7 vertebral body compared to prior exam which demonstrates some healing. Stable vertical fracture line seen involving the left side of anterior aspect of T1 vertebral body and osteophytes. Stable fracture involving base of osteophyte anteriorly of T2. Soft tissues and spinal canal: No prevertebral fluid or swelling. No visible canal hematoma. Disc levels: Moderate degenerative disc disease is noted at C6-7 and C7-T1. Upper chest: Negative. Other: Hypertrophy of posterior facet joints is noted bilaterally secondary to degenerative change. IMPRESSION: Mild chronic ischemic white matter disease. No acute intracranial abnormality seen. Postsurgical and degenerative changes are seen involving the cervical spine as described on prior exam. Grossly stable appearance of fractures involving anterior osteophytes of C6, C7, T1 and  T2 compared to prior exam. Persistent fracture line is seen involving the anterior portion of C7 vertebral body, but does demonstrate some healing compared to prior exam. Stable appearance of vertical fracture line is seen involving left side of anterior aspect of T1 vertebral body. No new fracture is noted in the cervical spine. Electronically Signed   By:  Lupita RaiderJames  Green Jr, M.D.   On: 12/11/2016 21:56   Ct Cervical Spine Wo Contrast  Result Date: 12/11/2016 CLINICAL DATA:  Worsening neck pain. History of cervical spine fracture. EXAM: CT HEAD WITHOUT CONTRAST CT CERVICAL SPINE WITHOUT CONTRAST TECHNIQUE: Multidetector CT imaging of the head and cervical spine was performed following the standard protocol without intravenous contrast. Multiplanar CT image reconstructions of the cervical spine were also generated. COMPARISON:  CT scan of October 26, 2016. FINDINGS: CT HEAD FINDINGS Brain: Mild chronic ischemic white matter disease is noted. 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 No mass effect or midline shift is noted. Ventricular size is within normal limits. There is no evidence of mass lesion, hemorrhage or acute infarction. Vascular: Atherosclerosis of carotid siphons is noted. Skull: Normal. Negative for fracture or focal lesion. Sinuses/Orbits: No acute finding. Other: None. CT CERVICAL SPINE FINDINGS Alignment: Minimal grade 1 anterolisthesis of C3-4 and C4-5 is noted secondary to posterior facet joint hypertrophy. Status post surgical anterior fusion of C5-6 with interbody fusion. Skull base and vertebrae: Stable fracture line is seen involving anterior osteophyte of C6-7. Persistent fracture line is seen extending through the anterior aspect of C7 vertebral body compared to prior exam which demonstrates some healing. Stable vertical fracture line seen involving the left side of anterior aspect of T1 vertebral body and osteophytes. Stable fracture involving base of osteophyte anteriorly of T2. Soft tissues and spinal canal: No prevertebral fluid or swelling. No visible canal hematoma. Disc levels: Moderate degenerative disc disease is noted at C6-7 and C7-T1. Upper chest: Negative. Other: Hypertrophy of posterior facet joints is noted bilaterally secondary to degenerative change. IMPRESSION: Mild chronic ischemic white matter disease. No  acute intracranial abnormality seen. Postsurgical and degenerative changes are seen involving the cervical spine as described on prior exam. Grossly stable appearance of fractures involving anterior osteophytes of C6, C7, T1 and T2 compared to prior exam. Persistent fracture line is seen involving the anterior portion of C7 vertebral body, but does demonstrate some healing compared to prior exam. Stable appearance of vertical fracture line is seen involving left side of anterior aspect of T1 vertebral body. No new fracture is noted in the cervical spine. Electronically Signed   By: Lupita RaiderJames  Green Jr, M.D.   On: 12/11/2016 21:56    ____________________________________________   PROCEDURES  Procedure(s) performed:   Procedures   Critical Care performed: No ____________________________________________   INITIAL IMPRESSION / ASSESSMENT AND PLAN / ED COURSE  Pertinent labs & imaging results that were available during my care of the patient were reviewed by me and considered in my medical decision making (see chart for details).  The patient's vital signs are normal she has no focal findings on physical exam including no abdominal pain, no difficulty breathing, and no focal neurological deficits.  I will evaluate broadly to see if she has any signs of acute or emergent medical condition.  If not she will return to her nursing facility.  Her daughters understand and agree with the plan.   Clinical Course as of Dec 12 14  Wed Dec 11, 2016  2256 The patient's workup has been unremarkable.  She has no evidence of infection, her kidney function is at baseline, and her CT head and C-spine are normal for her.  I discussed all of this with the patient's daughters and they are comfortable with her returning to her care facility.  I gave them my usual customary return precautions  [CF]    Clinical Course User Index [CF] Loleta Rose, MD    ____________________________________________  FINAL  CLINICAL IMPRESSION(S) / ED DIAGNOSES  Final diagnoses:  Generalized weakness  Fatigue, unspecified type     MEDICATIONS GIVEN DURING THIS VISIT:  Medications - No data to display   NEW OUTPATIENT MEDICATIONS STARTED DURING THIS VISIT:  New Prescriptions   No medications on file    Modified Medications   No medications on file    Discontinued Medications   No medications on file     Note:  This document was prepared using Dragon voice recognition software and may include unintentional dictation errors.    Loleta Rose, MD 12/12/16 [phone]

## 2016-12-12 ENCOUNTER — Encounter: Payer: Self-pay | Admitting: Family

## 2016-12-12 ENCOUNTER — Non-Acute Institutional Stay (SKILLED_NURSING_FACILITY): Payer: Medicare Other | Admitting: Family

## 2016-12-12 DIAGNOSIS — E1165 Type 2 diabetes mellitus with hyperglycemia: Secondary | ICD-10-CM

## 2016-12-12 DIAGNOSIS — Z794 Long term (current) use of insulin: Secondary | ICD-10-CM

## 2016-12-12 DIAGNOSIS — IMO0002 Reserved for concepts with insufficient information to code with codable children: Secondary | ICD-10-CM

## 2016-12-12 NOTE — Progress Notes (Signed)
Location:  [facility] Room Number: 703-P Place of Service:  SNF (31) Provider: Dinah Ngetich FNP-C   Irving CopasHACKER,ROBERT KELLER, MD  Patient Care Team: Henrine Screwsobert Thacker, MD as PCP - General (Family Medicine) Raj JanusAnna M Solum, MD as Consulting Physician (Endocrinology) Janalyn HarderStuart Tafeen, MD as Consulting Physician (Dermatology) Chilton Siiffany Green Lane, MD as Attending Physician (Cardiology)  Extended Emergency Contact Information Primary Emergency Contact: King,[name] J Address: [address]          [place], [place] [address] [place] Mobile Phone: [phone] Relation: Daughter Secondary Emergency Contact: Benedetto CoonsSimmons,Keeley  [place] Mobile Phone: [phone] Relation: Daughter  Code Status:  DNR  Goals of care: Advanced Directive information Advanced Directives 12/12/2016  Does Patient Have a Medical Advance Directive? Yes  Type of Advance Directive Out of facility DNR (pink MOST or yellow form)  Does patient want to make changes to medical advance directive? No - Patient declined  Copy of Healthcare Power of Attorney in Chart? No - copy requested  Would patient like information on creating a medical advance directive? -     Chief Complaint  Patient presents with  . Acute Visit    High blood sugars    HPI:  Pt is a 70 y.o. female seen today at [facility]  for medical management of chronic diseases.She has a medical history of DM, HTN, CHF, CKD stage 3, Hyperlipidemia, CVA, Hypothyroidism among other conditions.She is seen in her room today.Facility Nurse reports CBG's was 491 in the morning insulin 12 units Humalog given later rechecked after one hour CBG's were 502 another 8 units given.No signs of hyperglycemia reported.  Past Medical History:  Diagnosis Date  . Allergic rhinitis   . Anemia   . Anxiety   . Arthritis    "mostly feet, hands" (10/17/2016)  . Benign hypertension with CKD (chronic kidney  disease) stage III   . Benign paroxysmal positional vertigo   . Bipolar disorder (HCC)   . Broken finger   . Broken shoulder   . Broken toes   . Cervicalgia   . CHF (congestive heart failure) (HCC) 07/2016  . CKD (chronic kidney disease) stage 3, GFR 30-59 ml/min   . COPD (chronic obstructive pulmonary disease) (HCC)   . Depression   . Elevated liver enzymes Hep B/C neg 2014  . Fall at nursing home 10/15/2016  . GERD (gastroesophageal reflux disease)   . Glaucoma   . High cholesterol   . History of alcohol use   . History of blood transfusion 09/2016   "blood got really really low"  . Hypertension   . Hypothyroidism   . Interstitial cystitis    bladder stretched every 9 months  . Migraines    "pretty much qd" (10/17/2016)  . Psoriasis   . Stroke (HCC) 05/16/2016   Left occipital and thalamic, right hippocampal  . Thyroid disease   . Tobacco use   . 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 Vitamin B12 deficiency    Past Surgical History:  Procedure Laterality Date  . ANTERIOR CERVICAL DECOMP/DISCECTOMY FUSION  ~ 2009  . APPENDECTOMY    . BACK SURGERY    . BLADDER SUSPENSION    . CATARACT EXTRACTION W/ INTRAOCULAR LENS  IMPLANT, BILATERAL Bilateral   . DILATION AND CURETTAGE OF UTERUS    . HERNIA REPAIR    . LAPAROSCOPIC CHOLECYSTECTOMY    . TONSILLECTOMY    . TUBAL LIGATION    .  VAGINAL HYSTERECTOMY     with oophorectomy    Allergies  Allergen Reactions  . Alprazolam Other (See Comments)    Family preference, for patient to not take med  . Percocet [Oxycodone-Acetaminophen] Other (See Comments)    Family preference, for patient to not take med  . Codeine Diarrhea and Nausea And Vomiting  . Doxycycline Diarrhea and Nausea And Vomiting  . Hydrocodone Nausea And Vomiting  . Omnicef [Cefdinir] Nausea Only and Other (See Comments)    Constipation, tolerated Zosyn  . Augmentin [Amoxicillin-Pot Clavulanate] Hives and Rash    Has patient had a PCN  reaction causing immediate rash, facial/tongue/throat swelling, SOB or lightheadedness with hypotension: Yes Has patient had a PCN reaction causing severe rash involving mucus membranes or skin necrosis: Yes Did a PCN reaction that required hospitalization No Did PCN reaction occurring within the last 10 years: Yes If all of the above answers are "NO", then may proceed with Cephalosporin use.  Pt states she has taken penicillin since, and was ok with it...  . Ciprofloxacin Hives    Allergies as of 12/12/2016      Reactions   Alprazolam Other (See Comments)   Family preference, for patient to not take med   Percocet [oxycodone-acetaminophen] Other (See Comments)   Family preference, for patient to not take med   Codeine Diarrhea, Nausea And Vomiting   Doxycycline Diarrhea, Nausea And Vomiting   Hydrocodone Nausea And Vomiting   Omnicef [cefdinir] Nausea Only, Other (See Comments)   Constipation, tolerated Zosyn   Augmentin [amoxicillin-pot Clavulanate] Hives, Rash   Has patient had a PCN reaction causing immediate rash, facial/tongue/throat swelling, SOB or lightheadedness with hypotension: Yes Has patient had a PCN reaction causing severe rash involving mucus membranes or skin necrosis: Yes Did a PCN reaction that required hospitalization No Did PCN reaction occurring within the last 10 years: Yes If all of the above answers are "NO", then may proceed with Cephalosporin use. Pt states she has taken penicillin since, and was ok with it...   Ciprofloxacin Hives      Medication List       Accurate as of 12/12/16  5:40 PM. Always use your most recent med list.          acetaminophen 325 MG tablet Commonly known as:  TYLENOL Take 650 mg by mouth every 6 (six) hours as needed for mild pain or fever.   amLODipine 5 MG tablet Commonly known as:  NORVASC Take 5 mg by mouth daily.   aspirin 81 MG chewable tablet Chew 1 tablet (81 mg total) by mouth daily.   atorvastatin 10 MG  tablet Commonly known as:  LIPITOR Take 1 tablet (10 mg total) by mouth daily at 6 PM.   carvedilol 3.125 MG tablet Commonly known as:  COREG Take 1 tablet (3.125 mg total) by mouth 2 (two) times daily with a meal.   clopidogrel 75 MG tablet Commonly known as:  PLAVIX Take 75 mg by mouth daily.   escitalopram 20 MG tablet Commonly known as:  LEXAPRO Take 20 mg by mouth at bedtime.   famotidine 20 MG tablet Commonly known as:  PEPCID Take 1 tablet (20 mg total) by mouth daily.   feeding supplement (PRO-STAT SUGAR FREE 64) Liqd Take 30 mLs by mouth 2 (two) times daily.   insulin lispro 100 UNIT/ML injection Commonly known as:  HUMALOG Inject 0.01-0.05 mLs (1-5 Units total) into the skin 3 (three) times daily before meals. For CBGs < 150 =  0 units; 151 - 200 = 1 units, 201 - 250 = 2 units, 251 - 300 = 3 units; 301-350 = 4 units; 351-400 = 5 units; > 400 notify the provider.   HUMALOG 100 UNIT/ML injection Generic drug:  insulin lispro Inject 8-12 Units into the skin once. Give 12 units at 9:30AM, give 8 units at 11:30 AM to cover BS of 502   iron polysaccharides 150 MG capsule Commonly known as:  NIFEREX Take 1 capsule (150 mg total) by mouth 2 (two) times daily.   levothyroxine 112 MCG tablet Commonly known as:  SYNTHROID, LEVOTHROID Take 112 mcg by mouth daily before breakfast.   meclizine 25 MG tablet Commonly known as:  ANTIVERT Take 1 tablet (25 mg total) by mouth 3 (three) times daily as needed for dizziness.   NUTRITIONAL SUPPLEMENT Liqd Give Med Pass 2.0  Sugar free 120 mls by mouth three times daily between meals as a feeding supplement.   nystatin-triamcinolone ointment Commonly known as:  MYCOLOG Apply ointment every shift to perianal / buttocks for rash   ondansetron 4 MG disintegrating tablet Commonly known as:  ZOFRAN ODT Take 1 tablet (4 mg total) by mouth every 8 (eight) hours as needed for nausea or vomiting.   traMADol 50 MG tablet Commonly known  as:  ULTRAM Take 1 tablet (50 mg total) by mouth every 6 (six) hours as needed.       Review of Systems  Constitutional: Negative for activity change, appetite change, chills, diaphoresis and fever.  HENT: Negative for congestion, rhinorrhea, sinus pain and sinus pressure.   Eyes: Negative.   Respiratory: Negative for cough, chest tightness, shortness of breath and wheezing.   Cardiovascular: Negative for chest pain, palpitations and leg swelling.  Gastrointestinal: Negative for abdominal distention, abdominal pain, constipation, diarrhea, nausea and vomiting.  Endocrine: Negative for cold intolerance, heat intolerance, polydipsia, polyphagia and polyuria.  Genitourinary: Negative for dysuria, flank pain, frequency and urgency.  Musculoskeletal: Positive for gait problem.  Skin: Negative for color change, pallor and rash.  Neurological: Negative for dizziness, seizures, syncope, light-headedness and headaches.  Hematological: Does not bruise/bleed easily.  Psychiatric/Behavioral: Negative for agitation, confusion, hallucinations and sleep disturbance. The patient is not nervous/anxious.     Immunization History  Administered Date(s) Administered  . Influenza-Unspecified 07/16/2016  . PPD Test 11/06/2016, 11/13/2016  . Pneumococcal Polysaccharide-23 12/16/2008  . Pneumococcal-Unspecified 08/08/2012  . Td 12/16/2009   Pertinent  Health Maintenance Due  Topic Date Due  . MAMMOGRAM  08/08/1997  . DEXA SCAN  08/08/2012  . PNA vac Low Risk Adult (1 of 2 - PCV13) 08/08/2012  . URINE MICROALBUMIN  08/26/2015  . INFLUENZA VACCINE  07/16/2016  . OPHTHALMOLOGY EXAM  11/15/2016  . FOOT EXAM  01/16/2017  . HEMOGLOBIN A1C  05/19/2017  . COLONOSCOPY  10/16/2022   Fall Risk  01/17/2016  Falls in the past year? Yes  Number falls in past yr: 2 or more  Injury with Fall? No      Vitals:   12/12/16 1258  BP: 140/66  Pulse: 68  Resp: 16  Temp: 99.4 F (37.4 C)  TempSrc: Oral  SpO2:  98%  Weight: 124 lb (56.2 kg)  Height: 5\' 2"  (1.575 m)   Body mass index is 22.68 kg/m. Physical Exam  Constitutional: She is oriented to person, place, and time. She appears well-developed and well-nourished. No distress.  HENT:  Head: Normocephalic.  Mouth/Throat: Oropharynx is clear and moist. No oropharyngeal exudate.  Eyes: Conjunctivae and  EOM are normal. Pupils are equal, round, and reactive to light. Right eye exhibits no discharge. Left eye exhibits no discharge. No scleral icterus.  Neck: Normal range of motion. No JVD present. No thyromegaly present.  Cardiovascular: Normal rate, regular rhythm, normal heart sounds and intact distal pulses.  Exam reveals no gallop and no friction rub.   No murmur heard. Pulmonary/Chest: Effort normal and breath sounds normal. No respiratory distress. She has no wheezes. She has no rales.  Abdominal: Soft. Bowel sounds are normal. She exhibits no distension. There is no tenderness. There is no rebound and no guarding.  Musculoskeletal: She exhibits no edema, tenderness or deformity.  Moves x 4 extremities.Unsteady gait  Lymphadenopathy:    She has no cervical adenopathy.  Neurological: She is oriented to person, place, and time.  Confused at baseline.   Skin: Skin is warm and dry. No rash noted. No erythema. No pallor.  Psychiatric: She has a normal mood and affect.    Labs reviewed:  Recent Labs  06/24/16 0403  06/25/16 0308  09/20/16 1830  12/02/16 2004 12/03/16 0736 12/11/16 2007  NA 132*  < > 135  < > 136  < > 139 134* 139  K 4.3  < > 3.7  < > 3.7  < > 3.6 3.6 3.8  CL 104  < > 112*  < > 107  < > 109 102 107  CO2 10*  < > 16*  < > 20*  < > 24 22 24   GLUCOSE 531*  < > 138*  < > 147*  < > 56* 343* 97  BUN 38*  < > 33*  < > 58*  < > 33* 31* 48*  CREATININE 2.55*  < > 2.13*  < > 3.23*  < > 1.82* 1.65* 1.45*  CALCIUM 7.2*  < > 7.1*  < > 8.6*  < > 7.9* 7.9* 8.1*  MG 1.8  --  1.6*  --  1.6*  --   --   --  2.2  PHOS 3.6  --  2.0*   --  4.0  --   --   --   --   < > = values in this interval not displayed.  Recent Labs  10/18/16 0316 12/02/16 2004 12/11/16 2007  AST 18 24 27   ALT 19 15 16   ALKPHOS 85 82 84  BILITOT 0.1* 0.6 0.2*  PROT 3.5* 4.6* 5.1*  ALBUMIN 1.4* 1.8* 1.9*    Recent Labs  10/16/16 1055  12/02/16 2004 12/03/16 0736 12/11/16 2007  WBC 15.2*  < > 12.3* 12.0* 6.8  NEUTROABS 13.5*  --  8.7*  --  3.9  HGB 13.8  < > 8.9* 10.6* 9.3*  HCT 40.6  < > 26.5* 31.4* 26.8*  MCV 89.4  < > 91.8 91.7 90.0  PLT 340  < > 290 346 300  < > = values in this interval not displayed. Lab Results  Component Value Date   TSH 180.000 (H) 12/03/2016   Lab Results  Component Value Date   HGBA1C 7.5 11/18/2016   HGBA1C 7.5 11/18/2016   Lab Results  Component Value Date   CHOL 218 (A) 11/18/2016   HDL 82 (A) 11/18/2016   LDLCALC 99 11/18/2016   TRIG 189 (A) 11/18/2016   CHOLHDL 2.9 07/13/2016   Assessment/Plan   Type 2 diabetes mellitus, uncontrolled  CBG was 491 in the morning insulin 12 units Humalog given later rechecked after one hour CBG's were 502 another 8  units given.Add Lantus 8 units SQ at bedtime.Continue on Humalog per SSI. Awaiting Endocrinologist appointment in January, 2018 for evaluation of uncontrolled blood sugars. Continue to monitor.     Family/ staff Communication: Reviewed plan of care with patient and facility Nurse supervisor.   Labs/tests ordered: None

## 2016-12-16 DIAGNOSIS — J189 Pneumonia, unspecified organism: Secondary | ICD-10-CM

## 2016-12-16 HISTORY — DX: Pneumonia, unspecified organism: J18.9

## 2016-12-23 ENCOUNTER — Encounter (HOSPITAL_COMMUNITY): Payer: Self-pay | Admitting: Emergency Medicine

## 2016-12-23 ENCOUNTER — Inpatient Hospital Stay (HOSPITAL_COMMUNITY)
Admission: EM | Admit: 2016-12-23 | Discharge: 2016-12-29 | DRG: 193 | Disposition: A | Discharge status: Home Health | Payer: Medicare Other | Attending: Internal Medicine | Admitting: Internal Medicine

## 2016-12-23 ENCOUNTER — Emergency Department (HOSPITAL_COMMUNITY): Payer: Medicare Other

## 2016-12-23 DIAGNOSIS — D638 Anemia in other chronic diseases classified elsewhere: Secondary | ICD-10-CM | POA: Diagnosis present

## 2016-12-23 DIAGNOSIS — E162 Hypoglycemia, unspecified: Secondary | ICD-10-CM | POA: Diagnosis present

## 2016-12-23 DIAGNOSIS — E538 Deficiency of other specified B group vitamins: Secondary | ICD-10-CM | POA: Diagnosis present

## 2016-12-23 DIAGNOSIS — Z87891 Personal history of nicotine dependence: Secondary | ICD-10-CM

## 2016-12-23 DIAGNOSIS — J440 Chronic obstructive pulmonary disease with acute lower respiratory infection: Secondary | ICD-10-CM | POA: Diagnosis present

## 2016-12-23 DIAGNOSIS — F419 Anxiety disorder, unspecified: Secondary | ICD-10-CM | POA: Diagnosis present

## 2016-12-23 DIAGNOSIS — E7800 Pure hypercholesterolemia, unspecified: Secondary | ICD-10-CM | POA: Diagnosis present

## 2016-12-23 DIAGNOSIS — E1043 Type 1 diabetes mellitus with diabetic autonomic (poly)neuropathy: Secondary | ICD-10-CM | POA: Diagnosis present

## 2016-12-23 DIAGNOSIS — G9349 Other encephalopathy: Secondary | ICD-10-CM | POA: Diagnosis present

## 2016-12-23 DIAGNOSIS — R5381 Other malaise: Secondary | ICD-10-CM | POA: Diagnosis present

## 2016-12-23 DIAGNOSIS — IMO0002 Reserved for concepts with insufficient information to code with codable children: Secondary | ICD-10-CM

## 2016-12-23 DIAGNOSIS — I130 Hypertensive heart and chronic kidney disease with heart failure and stage 1 through stage 4 chronic kidney disease, or unspecified chronic kidney disease: Secondary | ICD-10-CM | POA: Diagnosis present

## 2016-12-23 DIAGNOSIS — E11649 Type 2 diabetes mellitus with hypoglycemia without coma: Secondary | ICD-10-CM

## 2016-12-23 DIAGNOSIS — K3184 Gastroparesis: Secondary | ICD-10-CM | POA: Diagnosis present

## 2016-12-23 DIAGNOSIS — K219 Gastro-esophageal reflux disease without esophagitis: Secondary | ICD-10-CM | POA: Diagnosis present

## 2016-12-23 DIAGNOSIS — E10649 Type 1 diabetes mellitus with hypoglycemia without coma: Secondary | ICD-10-CM | POA: Diagnosis present

## 2016-12-23 DIAGNOSIS — E1165 Type 2 diabetes mellitus with hyperglycemia: Secondary | ICD-10-CM

## 2016-12-23 DIAGNOSIS — E784 Other hyperlipidemia: Secondary | ICD-10-CM | POA: Diagnosis not present

## 2016-12-23 DIAGNOSIS — Z811 Family history of alcohol abuse and dependence: Secondary | ICD-10-CM

## 2016-12-23 DIAGNOSIS — E039 Hypothyroidism, unspecified: Secondary | ICD-10-CM | POA: Diagnosis present

## 2016-12-23 DIAGNOSIS — Z800 Family history of malignant neoplasm of digestive organs: Secondary | ICD-10-CM

## 2016-12-23 DIAGNOSIS — N183 Chronic kidney disease, stage 3 unspecified: Secondary | ICD-10-CM | POA: Diagnosis present

## 2016-12-23 DIAGNOSIS — Z8249 Family history of ischemic heart disease and other diseases of the circulatory system: Secondary | ICD-10-CM

## 2016-12-23 DIAGNOSIS — Z881 Allergy status to other antibiotic agents status: Secondary | ICD-10-CM

## 2016-12-23 DIAGNOSIS — Z87898 Personal history of other specified conditions: Secondary | ICD-10-CM

## 2016-12-23 DIAGNOSIS — J189 Pneumonia, unspecified organism: Secondary | ICD-10-CM | POA: Diagnosis not present

## 2016-12-23 DIAGNOSIS — Z7982 Long term (current) use of aspirin: Secondary | ICD-10-CM

## 2016-12-23 DIAGNOSIS — I5032 Chronic diastolic (congestive) heart failure: Secondary | ICD-10-CM | POA: Diagnosis present

## 2016-12-23 DIAGNOSIS — K5909 Other constipation: Secondary | ICD-10-CM | POA: Diagnosis present

## 2016-12-23 DIAGNOSIS — Z720 Tobacco use: Secondary | ICD-10-CM | POA: Diagnosis present

## 2016-12-23 DIAGNOSIS — Z90710 Acquired absence of both cervix and uterus: Secondary | ICD-10-CM

## 2016-12-23 DIAGNOSIS — F05 Delirium due to known physiological condition: Secondary | ICD-10-CM | POA: Diagnosis present

## 2016-12-23 DIAGNOSIS — F329 Major depressive disorder, single episode, unspecified: Secondary | ICD-10-CM | POA: Diagnosis present

## 2016-12-23 DIAGNOSIS — Z794 Long term (current) use of insulin: Secondary | ICD-10-CM

## 2016-12-23 DIAGNOSIS — Z8673 Personal history of transient ischemic attack (TIA), and cerebral infarction without residual deficits: Secondary | ICD-10-CM

## 2016-12-23 DIAGNOSIS — I5033 Acute on chronic diastolic (congestive) heart failure: Secondary | ICD-10-CM | POA: Diagnosis present

## 2016-12-23 DIAGNOSIS — Y95 Nosocomial condition: Secondary | ICD-10-CM | POA: Diagnosis present

## 2016-12-23 DIAGNOSIS — Z823 Family history of stroke: Secondary | ICD-10-CM

## 2016-12-23 DIAGNOSIS — Z833 Family history of diabetes mellitus: Secondary | ICD-10-CM

## 2016-12-23 DIAGNOSIS — H409 Unspecified glaucoma: Secondary | ICD-10-CM | POA: Diagnosis present

## 2016-12-23 DIAGNOSIS — Z981 Arthrodesis status: Secondary | ICD-10-CM

## 2016-12-23 DIAGNOSIS — Z8261 Family history of arthritis: Secondary | ICD-10-CM

## 2016-12-23 DIAGNOSIS — F32A Depression, unspecified: Secondary | ICD-10-CM | POA: Diagnosis present

## 2016-12-23 DIAGNOSIS — H8110 Benign paroxysmal vertigo, unspecified ear: Secondary | ICD-10-CM | POA: Diagnosis present

## 2016-12-23 DIAGNOSIS — E1022 Type 1 diabetes mellitus with diabetic chronic kidney disease: Secondary | ICD-10-CM | POA: Diagnosis present

## 2016-12-23 DIAGNOSIS — Z87440 Personal history of urinary (tract) infections: Secondary | ICD-10-CM

## 2016-12-23 DIAGNOSIS — E785 Hyperlipidemia, unspecified: Secondary | ICD-10-CM | POA: Diagnosis present

## 2016-12-23 DIAGNOSIS — E1065 Type 1 diabetes mellitus with hyperglycemia: Secondary | ICD-10-CM | POA: Diagnosis present

## 2016-12-23 DIAGNOSIS — Z7902 Long term (current) use of antithrombotics/antiplatelets: Secondary | ICD-10-CM

## 2016-12-23 DIAGNOSIS — Z825 Family history of asthma and other chronic lower respiratory diseases: Secondary | ICD-10-CM

## 2016-12-23 DIAGNOSIS — Z79899 Other long term (current) drug therapy: Secondary | ICD-10-CM

## 2016-12-23 HISTORY — DX: Pneumonia, unspecified organism: J18.9

## 2016-12-23 LAB — COMPREHENSIVE METABOLIC PANEL
ALBUMIN: 1.8 g/dL — AB (ref 3.5–5.0)
ALK PHOS: 112 U/L (ref 38–126)
ALT: 21 U/L (ref 14–54)
ANION GAP: 8 (ref 5–15)
AST: 31 U/L (ref 15–41)
BILIRUBIN TOTAL: 0.5 mg/dL (ref 0.3–1.2)
BUN: 20 mg/dL (ref 6–20)
CALCIUM: 7.9 mg/dL — AB (ref 8.9–10.3)
CO2: 27 mmol/L (ref 22–32)
CREATININE: 1.23 mg/dL — AB (ref 0.44–1.00)
Chloride: 106 mmol/L (ref 101–111)
GFR calc Af Amer: 51 mL/min — ABNORMAL LOW (ref >60)
GFR calc non Af Amer: 44 mL/min — ABNORMAL LOW (ref >60)
GLUCOSE: 99 mg/dL (ref 65–99)
Potassium: 3.9 mmol/L (ref 3.5–5.1)
Sodium: 141 mmol/L (ref 135–145)
TOTAL PROTEIN: 5.1 g/dL — AB (ref 6.5–8.1)

## 2016-12-23 LAB — CBC WITH DIFFERENTIAL/PLATELET
Basophils Absolute: 0 10*3/uL (ref 0.0–0.1)
Basophils Relative: 0 %
Eosinophils Absolute: 0.1 10*3/uL (ref 0.0–0.7)
Eosinophils Relative: 1 %
HEMATOCRIT: 29.7 % — AB (ref 36.0–46.0)
HEMOGLOBIN: 9.8 g/dL — AB (ref 12.0–15.0)
LYMPHS ABS: 1.2 10*3/uL (ref 0.7–4.0)
Lymphocytes Relative: 11 %
MCH: 29.7 pg (ref 26.0–34.0)
MCHC: 33 g/dL (ref 30.0–36.0)
MCV: 90 fL (ref 78.0–100.0)
Monocytes Absolute: 0.4 10*3/uL (ref 0.1–1.0)
Monocytes Relative: 4 %
NEUTROS ABS: 9 10*3/uL — AB (ref 1.7–7.7)
NEUTROS PCT: 84 %
Platelets: 333 10*3/uL (ref 150–400)
RBC: 3.3 MIL/uL — AB (ref 3.87–5.11)
RDW: 13.9 % (ref 11.5–15.5)
WBC: 10.6 10*3/uL — ABNORMAL HIGH (ref 4.0–10.5)

## 2016-12-23 LAB — T4, FREE: FREE T4: 0.77 ng/dL (ref 0.61–1.12)

## 2016-12-23 LAB — GLUCOSE, CAPILLARY
GLUCOSE-CAPILLARY: 146 mg/dL — AB (ref 65–99)
Glucose-Capillary: 141 mg/dL — ABNORMAL HIGH (ref 65–99)

## 2016-12-23 LAB — AMMONIA: AMMONIA: 24 umol/L (ref 9–35)

## 2016-12-23 LAB — URINALYSIS, ROUTINE W REFLEX MICROSCOPIC
Bilirubin Urine: NEGATIVE
Glucose, UA: 50 mg/dL — AB
HGB URINE DIPSTICK: NEGATIVE
Ketones, ur: NEGATIVE mg/dL
Leukocytes, UA: NEGATIVE
Nitrite: NEGATIVE
SPECIFIC GRAVITY, URINE: 1.01 (ref 1.005–1.030)
SQUAMOUS EPITHELIAL / LPF: NONE SEEN
pH: 7 (ref 5.0–8.0)

## 2016-12-23 LAB — I-STAT CG4 LACTIC ACID, ED
Lactic Acid, Venous: 1.49 mmol/L (ref 0.5–1.9)
Lactic Acid, Venous: 1.07 mmol/L (ref 0.5–1.9)

## 2016-12-23 LAB — LIPASE, BLOOD: Lipase: 23 U/L (ref 11–51)

## 2016-12-23 LAB — MRSA PCR SCREENING: MRSA BY PCR: NEGATIVE

## 2016-12-23 LAB — CBG MONITORING, ED
GLUCOSE-CAPILLARY: 128 mg/dL — AB (ref 65–99)
GLUCOSE-CAPILLARY: 110 mg/dL — AB (ref 65–99)
GLUCOSE-CAPILLARY: 176 mg/dL — AB (ref 65–99)

## 2016-12-23 LAB — STREP PNEUMONIAE URINARY ANTIGEN: STREP PNEUMO URINARY ANTIGEN: NEGATIVE

## 2016-12-23 LAB — LACTIC ACID, PLASMA
LACTIC ACID, VENOUS: 2.5 mmol/L — AB (ref 0.5–1.9)
LACTIC ACID, VENOUS: 2.6 mmol/L — AB (ref 0.5–1.9)

## 2016-12-23 LAB — TSH: TSH: 39.016 u[IU]/mL — AB (ref 0.350–4.500)

## 2016-12-23 MED ORDER — DEXTROSE 5 % IV SOLN
1.0000 g | Freq: Once | INTRAVENOUS | Status: DC
  Filled 2016-12-23: qty 1

## 2016-12-23 MED ORDER — AZTREONAM 2 G IJ SOLR: 2.0000 g | Freq: Three times a day (TID) | INTRAMUSCULAR | Status: DC

## 2016-12-23 MED ORDER — VANCOMYCIN HCL IN DEXTROSE 1-5 GM/200ML-% IV SOLN: 1000.0000 mg | INTRAVENOUS | Status: DC

## 2016-12-23 MED ORDER — LEVOTHYROXINE SODIUM 112 MCG PO TABS
112.0000 ug | ORAL_TABLET | Freq: Every day | ORAL | Status: DC
  Administered 2016-12-24 – 2016-12-29 (×6): 112 ug via ORAL
  Filled 2016-12-23 (×5): qty 1

## 2016-12-23 MED ORDER — DEXTROSE 5 % IV SOLN: 2.0000 g | Freq: Three times a day (TID) | INTRAVENOUS | Status: DC

## 2016-12-23 MED ORDER — ONDANSETRON 4 MG PO TBDP
4.0000 mg | ORAL_TABLET | Freq: Three times a day (TID) | ORAL | Status: DC | PRN
  Administered 2016-12-24 – 2016-12-28 (×4): 4 mg via ORAL
  Filled 2016-12-23 (×4): qty 1

## 2016-12-23 MED ORDER — POLYSACCHARIDE IRON COMPLEX 150 MG PO CAPS
150.0000 mg | ORAL_CAPSULE | Freq: Two times a day (BID) | ORAL | Status: DC
  Administered 2016-12-24 – 2016-12-29 (×10): 150 mg via ORAL
  Filled 2016-12-23 (×12): qty 1

## 2016-12-23 MED ORDER — VANCOMYCIN HCL IN DEXTROSE 1-5 GM/200ML-% IV SOLN
1000.0000 mg | Freq: Once | INTRAVENOUS | Status: AC
  Administered 2016-12-23: 1000 mg via INTRAVENOUS
  Filled 2016-12-23: qty 200

## 2016-12-23 MED ORDER — ESCITALOPRAM OXALATE 20 MG PO TABS: 20.0000 mg | ORAL_TABLET | Freq: Every day | ORAL | Status: DC

## 2016-12-23 MED ORDER — GUAIFENESIN ER 600 MG PO TB12
600.0000 mg | ORAL_TABLET | Freq: Two times a day (BID) | ORAL | Status: DC
  Administered 2016-12-24 – 2016-12-29 (×11): 600 mg via ORAL
  Filled 2016-12-23 (×11): qty 1

## 2016-12-23 MED ORDER — MECLIZINE HCL 25 MG PO TABS
25.0000 mg | ORAL_TABLET | Freq: Three times a day (TID) | ORAL | Status: DC | PRN
  Administered 2016-12-24 – 2016-12-26 (×2): 25 mg via ORAL
  Filled 2016-12-23 (×2): qty 1

## 2016-12-23 MED ORDER — AMLODIPINE BESYLATE 5 MG PO TABS
5.0000 mg | ORAL_TABLET | Freq: Every day | ORAL | Status: DC
  Administered 2016-12-23 – 2016-12-29 (×7): 5 mg via ORAL
  Filled 2016-12-23 (×7): qty 1

## 2016-12-23 MED ORDER — VANCOMYCIN HCL IN DEXTROSE 750-5 MG/150ML-% IV SOLN
750.0000 mg | INTRAVENOUS | Status: DC
Start: 2016-12-24 — End: 2016-12-23

## 2016-12-23 MED ORDER — ESCITALOPRAM OXALATE 10 MG PO TABS
15.0000 mg | ORAL_TABLET | Freq: Every day | ORAL | Status: DC
  Administered 2016-12-23 – 2016-12-29 (×7): 15 mg via ORAL
  Filled 2016-12-23 (×7): qty 2

## 2016-12-23 MED ORDER — ATORVASTATIN CALCIUM 10 MG PO TABS
10.0000 mg | ORAL_TABLET | Freq: Every day | ORAL | Status: DC
  Administered 2016-12-23 – 2016-12-28 (×6): 10 mg via ORAL
  Filled 2016-12-23 (×7): qty 1

## 2016-12-23 MED ORDER — HEPARIN SODIUM (PORCINE) 5000 UNIT/ML IJ SOLN
5000.0000 [IU] | Freq: Three times a day (TID) | INTRAMUSCULAR | Status: DC
  Administered 2016-12-24 – 2016-12-28 (×11): 5000 [IU] via SUBCUTANEOUS
  Filled 2016-12-23 (×11): qty 1

## 2016-12-23 MED ORDER — CARVEDILOL 3.125 MG PO TABS
3.1250 mg | ORAL_TABLET | Freq: Two times a day (BID) | ORAL | Status: DC
  Administered 2016-12-23 – 2016-12-29 (×12): 3.125 mg via ORAL
  Filled 2016-12-23 (×13): qty 1

## 2016-12-23 MED ORDER — FAMOTIDINE 20 MG PO TABS
20.0000 mg | ORAL_TABLET | Freq: Every day | ORAL | Status: DC
  Administered 2016-12-23 – 2016-12-29 (×7): 20 mg via ORAL
  Filled 2016-12-23 (×7): qty 1

## 2016-12-23 MED ORDER — DEXTROSE 5 % IV SOLN
1.0000 g | Freq: Three times a day (TID) | INTRAVENOUS | Status: DC
  Administered 2016-12-23 – 2016-12-25 (×6): 1 g via INTRAVENOUS
  Filled 2016-12-23 (×8): qty 1

## 2016-12-23 MED ORDER — CLOPIDOGREL BISULFATE 75 MG PO TABS
75.0000 mg | ORAL_TABLET | Freq: Every day | ORAL | Status: DC
  Administered 2016-12-23 – 2016-12-29 (×7): 75 mg via ORAL
  Filled 2016-12-23 (×7): qty 1

## 2016-12-23 MED ORDER — SODIUM CHLORIDE 0.9 % IV BOLUS (SEPSIS)
250.0000 mL | Freq: Once | INTRAVENOUS | Status: AC
  Administered 2016-12-23: 250 mL via INTRAVENOUS

## 2016-12-23 MED ORDER — ESCITALOPRAM OXALATE 10 MG PO TABS: 5.0000 mg | ORAL_TABLET | Freq: Every day | ORAL | Status: DC

## 2016-12-23 MED ORDER — ASPIRIN 81 MG PO CHEW
81.0000 mg | CHEWABLE_TABLET | Freq: Every day | ORAL | Status: DC
  Administered 2016-12-23 – 2016-12-29 (×7): 81 mg via ORAL
  Filled 2016-12-23 (×7): qty 1

## 2016-12-23 MED ORDER — VANCOMYCIN HCL IN DEXTROSE 1-5 GM/200ML-% IV SOLN
1000.0000 mg | INTRAVENOUS | Status: DC
  Administered 2016-12-24: 1000 mg via INTRAVENOUS
  Filled 2016-12-23: qty 200

## 2016-12-23 MED ORDER — INSULIN ASPART 100 UNIT/ML ~~LOC~~ SOLN
0.0000 [IU] | Freq: Three times a day (TID) | SUBCUTANEOUS | Status: DC
  Administered 2016-12-23: 1 [IU] via SUBCUTANEOUS
  Administered 2016-12-23: 2 [IU] via SUBCUTANEOUS
  Administered 2016-12-24: 5 [IU] via SUBCUTANEOUS
  Filled 2016-12-23: qty 1

## 2016-12-23 MED ORDER — SODIUM CHLORIDE 0.9 % IV BOLUS (SEPSIS)
500.0000 mL | Freq: Once | INTRAVENOUS | Status: AC
  Administered 2016-12-23: 500 mL via INTRAVENOUS

## 2016-12-23 NOTE — ED Notes (Signed)
Drue Novelikki [phone]- [phone] pt's first contact. Daughter

## 2016-12-23 NOTE — ED Notes (Signed)
Warm blanket given to patient

## 2016-12-23 NOTE — Progress Notes (Signed)
Pharmacy Antibiotic Note  Cassandra [name] is a 71 y.o. female admitted on 12/23/2016 with pneumonia.  Pharmacy has been consulted for vancomycin dosing.  Patient received cefepime 1g and vancomycin 1g IV once in the ED.  Plan: Vancomycin 750mg  IV every 24 hours.  Goal trough 15-20 mcg/mL.  F/u on subsequent cefepime doses Monitor culture data, renal function and clinical course VT at SS prn  Height: 5\' 2"  (157.5 cm) Weight: 124 lb (56.2 kg) IBW/kg (Calculated) : 50.1  Temp (24hrs), Avg:94.9 F (34.9 C), Min:94.9 F (34.9 C), Max:94.9 F (34.9 C)   Recent Labs Lab 12/23/16 0914 12/23/16 0932  WBC 10.6*  --   CREATININE 1.23*  --   LATICACIDVEN  --  1.49    Estimated Creatinine Clearance: 34.1 mL/min (by C-G formula based on SCr of 1.23 mg/dL (H)).    Allergies  Allergen Reactions  . Alprazolam Other (See Comments)    Family preference, for patient to not take med  . Percocet [Oxycodone-Acetaminophen] Other (See Comments)    Family preference, for patient to not take med  . Codeine Diarrhea and Nausea And Vomiting  . Doxycycline Diarrhea and Nausea And Vomiting  . Hydrocodone Nausea And Vomiting  . Omnicef [Cefdinir] Nausea Only and Other (See Comments)    Constipation, tolerated Zosyn  . Augmentin [Amoxicillin-Pot Clavulanate] Hives and Rash    Has patient had a PCN reaction causing immediate rash, facial/tongue/throat swelling, SOB or lightheadedness with hypotension: Yes Has patient had a PCN reaction causing severe rash involving mucus membranes or skin necrosis: Yes Did a PCN reaction that required hospitalization No Did PCN reaction occurring within the last 10 years: Yes If all of the above answers are "NO", then may proceed with Cephalosporin use.  Pt states she has taken penicillin since, and was ok with it...  . Ciprofloxacin Hives     Arlean Hoppingorey M. Newman PiesBall, PharmD, BCPS Clinical Pharmacist Pager [phone] 12/23/2016 10:18 AM

## 2016-12-23 NOTE — ED Provider Notes (Signed)
[facility]-EMERGENCY DEPT Provider Note   CSN: [id number] Arrival date & time: 12/23/16  7829     History   Chief Complaint Chief Complaint  Patient presents with  . Hypoglycemia    HPI Cassandra [name] is a 71 y.o. female with a past medical history significant for diabetes with history of DKA, CK D, hypertension, CHF, COPD, GERD, and prior stroke on aspirin and Plavix and recent urinary tract infection who presents from her nursing facility for sonorous respirations and hypoglycemia in the setting of chills, emesis, and some bladder spasms. Patient says that she has had chills for the last few days and has continued to have some bloating and sensation of bladder spasm occasionally. She says that she thinks she took an antibiotic but is now done with it for her recent UTI. She reports continued chills but denies fevers. She says that yesterday she had nausea and vomiting. He said she has had decrease in appetite and decrease in oral intake yesterday. She says that she is continued to take her normal glucose management regimen. She reports chronic constipation but no diarrhea. She denies any dysuria, foul-smelling urine, or change in urine. She denies any cough, congestion, or rhinorrhea but according to nursing, EMS reported sonorous respirations. Patient found to be hypoglycemic with a glucose of 34 when she was assessed at the nursing home. Patient denies any pain at this time occluding no chest pain, palpitations, shortness of breath, abdominal pain, flank pain, or back pain. She denies any other symptoms on arrival.    The history is provided by the patient and medical records. The history is limited by the condition of the patient.  Altered Mental Status   This is a new problem. The current episode started 12 to 24 hours ago. The problem has been gradually improving. Associated symptoms include confusion. Pertinent negatives include no unresponsiveness, no weakness, no agitation, no delusions  and no hallucinations. Risk factors include a recent illness and a recent infection. Her past medical history is significant for diabetes, CVA and COPD.  Emesis   This is a new problem. The current episode started yesterday. The problem occurs 2 to 4 times per day. The problem has been resolved. The emesis has an appearance of stomach contents. Associated symptoms include chills. Pertinent negatives include no abdominal pain, no diarrhea, no fever and no headaches.    Past Medical History:  Diagnosis Date  . Allergic rhinitis   . Anemia   . Anxiety   . Arthritis    "mostly feet, hands" (10/17/2016)  . Benign hypertension with CKD (chronic kidney disease) stage III   . Benign paroxysmal positional vertigo   . Bipolar disorder (HCC)   . Broken finger   . Broken shoulder   . Broken toes   . Cervicalgia   . CHF (congestive heart failure) (HCC) 07/2016  . CKD (chronic kidney disease) stage 3, GFR 30-59 ml/min   . COPD (chronic obstructive pulmonary disease) (HCC)   . Depression   . Elevated liver enzymes Hep B/C neg 2014  . Fall at nursing home 10/15/2016  . GERD (gastroesophageal reflux disease)   . Glaucoma   . High cholesterol   . History of alcohol use   . History of blood transfusion 09/2016   "blood got really really low"  . Hypertension   . Hypothyroidism   . Interstitial cystitis    bladder stretched every 9 months  . Migraines    "pretty much qd" (10/17/2016)  . Psoriasis   .  Stroke (HCC) 05/16/2016   Left occipital and thalamic, right hippocampal  . Thyroid disease   . Tobacco use   . 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 Vitamin B12 deficiency     Patient Active Problem List   Diagnosis Date Noted  . Hypoglycemia 12/03/2016  . Palliative care encounter   . Goals of care, counseling/discussion   . Cervical vertebral fracture (HCC) 10/17/2016  . Thoracic vertebral fracture (HCC) 10/17/2016  . Fall at nursing home 10/17/2016  . Common bile  duct dilatation 10/17/2016  . Closed fracture of cervical vertebra (HCC)   . Recent Gram-negative bacteremia 10/07/2016  . Gastroesophageal reflux disease   . DKA, type 1 (HCC) 09/06/2016  . Protein-calorie malnutrition, severe 08/28/2016  . Chronic diastolic heart failure (HCC) 07/19/2016  . Dehydration   . Cerebral embolism with cerebral infarction 07/13/2016  . Left ventricular diastolic dysfunction, NYHA class 1 07/11/2016  . History of CVA (cerebrovascular accident)   . HLD (hyperlipidemia)   . Anemia of chronic disease   . Physical deconditioning   . Acute on chronic kidney failure (HCC)   . Diabetic ketoacidosis with coma associated with type 1 diabetes mellitus (HCC)   . Cerebral thrombosis with cerebral infarction 05/15/2016  . Generalized anxiety disorder 05/10/2016  . Diabetic ketoacidosis without coma associated with type 1 diabetes mellitus (HCC) 05/06/2016  . Insulin dependent type 2 diabetes mellitus, uncontrolled (HCC)   . Depression   . CKD (chronic kidney disease) stage 3, GFR 30-59 ml/min   . Uncontrolled Hypothyroidism   . Vitamin B12 deficiency   . Benign paroxysmal positional vertigo   . Anxiety   . Allergic rhinitis   . Glaucoma   . Benign hypertension with CKD (chronic kidney disease) stage III   . Tobacco abuse   . Cervicalgia   . Elevated liver enzymes   . History of alcohol use     Past Surgical History:  Procedure Laterality Date  . ANTERIOR CERVICAL DECOMP/DISCECTOMY FUSION  ~ 2009  . APPENDECTOMY    . BACK SURGERY    . BLADDER SUSPENSION    . CATARACT EXTRACTION W/ INTRAOCULAR LENS  IMPLANT, BILATERAL Bilateral   . DILATION AND CURETTAGE OF UTERUS    . HERNIA REPAIR    . LAPAROSCOPIC CHOLECYSTECTOMY    . TONSILLECTOMY    . TUBAL LIGATION    . VAGINAL HYSTERECTOMY     with oophorectomy    OB History    No data available       Home Medications    Prior to Admission medications   Medication Sig Start Date End Date Taking?  Authorizing Provider  acetaminophen (TYLENOL) 325 MG tablet Take 650 mg by mouth every 6 (six) hours as needed for mild pain or fever.     Historical Provider, MD  Amino Acids-Protein Hydrolys (FEEDING SUPPLEMENT, PRO-STAT SUGAR FREE 64,) LIQD Take 30 mLs by mouth 2 (two) times daily.    Historical Provider, MD  amLODipine (NORVASC) 5 MG tablet Take 5 mg by mouth daily.     Historical Provider, MD  aspirin 81 MG chewable tablet Chew 1 tablet (81 mg total) by mouth daily. 09/24/16   Vassie Loll, MD  atorvastatin (LIPITOR) 10 MG tablet Take 1 tablet (10 mg total) by mouth daily at 6 PM. 05/19/16   Rolly Salter, MD  carvedilol (COREG) 3.125 MG tablet Take 1 tablet (3.125 mg total) by mouth 2 (two) times daily with a meal. 07/18/16  Mauricio Annett Gula, MD  clopidogrel (PLAVIX) 75 MG tablet Take 75 mg by mouth daily.    Historical Provider, MD  escitalopram (LEXAPRO) 20 MG tablet Take 20 mg by mouth at bedtime.     Historical Provider, MD  famotidine (PEPCID) 20 MG tablet Take 1 tablet (20 mg total) by mouth daily. 10/22/16   Osvaldo Shipper, MD  insulin lispro (HUMALOG) 100 UNIT/ML injection Inject 0.01-0.05 mLs (1-5 Units total) into the skin 3 (three) times daily before meals. For CBGs < 150 = 0 units; 151 - 200 = 1 units, 201 - 250 = 2 units, 251 - 300 = 3 units; 301-350 = 4 units; 351-400 = 5 units; > 400 notify the provider. 11/21/16   Dinah C Ngetich, NP  insulin lispro (HUMALOG) 100 UNIT/ML injection Inject 8-12 Units into the skin once. Give 12 units at 9:30AM, give 8 units at 11:30 AM to cover BS of 502 12/12/16 12/13/16  Historical Provider, MD  iron polysaccharides (NIFEREX) 150 MG capsule Take 1 capsule (150 mg total) by mouth 2 (two) times daily. 06/28/16   Vassie Loll, MD  levothyroxine (SYNTHROID, LEVOTHROID) 112 MCG tablet Take 112 mcg by mouth daily before breakfast.    Historical Provider, MD  meclizine (ANTIVERT) 25 MG tablet Take 1 tablet (25 mg total) by mouth 3 (three) times daily  as needed for dizziness. 02/12/16   Chilton Si, MD  NUTRITIONAL SUPPLEMENT LIQD Give Med Pass 2.0  Sugar free 120 mls by mouth three times daily between meals as a feeding supplement.    Historical Provider, MD  nystatin-triamcinolone ointment (MYCOLOG) Apply ointment every shift to perianal / buttocks for rash    Historical Provider, MD  ondansetron (ZOFRAN ODT) 4 MG disintegrating tablet Take 1 tablet (4 mg total) by mouth every 8 (eight) hours as needed for nausea or vomiting. 12/02/16   Sharman Cheek, MD  traMADol (ULTRAM) 50 MG tablet Take 1 tablet (50 mg total) by mouth every 6 (six) hours as needed. 12/04/16   Wyatt Haste, MD    Family History Family History  Problem Relation Age of Onset  . Alcohol abuse Mother   . Arthritis Mother   . Asthma Mother   . Cancer Mother     colon cancer  . Hypertension Mother   . Migraines Mother   . Stroke Mother   . Lung disease Mother   . COPD Mother   . Diabetes Father   . Hypertension Father   . Heart disease Father   . Heart attack Father   . Heart disease Paternal Grandmother   . Diabetes Paternal Grandmother   . Stroke Paternal Grandmother   . Cancer Paternal Grandmother   . Diabetes Paternal Grandfather     Social History Social History  Substance Use Topics  . Smoking status: Former Smoker    Packs/day: 0.25    Years: 10.00    Types: Cigarettes    Quit date: 12/1923  . Smokeless tobacco: Never Used  . Alcohol use No     Comment: Hasn't had any alcohol "for 2 yrs" (10/17/2016)     Allergies   Alprazolam; Percocet [oxycodone-acetaminophen]; Codeine; Doxycycline; Hydrocodone; Omnicef [cefdinir]; Augmentin [amoxicillin-pot clavulanate]; and Ciprofloxacin   Review of Systems Review of Systems  Constitutional: Positive for chills and fatigue. Negative for diaphoresis and fever.  HENT: Negative for congestion and rhinorrhea.   Respiratory: Negative for chest tightness, shortness of breath and stridor.     Cardiovascular: Negative for chest pain.  Gastrointestinal:  Positive for constipation, nausea and vomiting. Negative for abdominal pain and diarrhea.  Genitourinary: Negative for dysuria and flank pain.  Musculoskeletal: Negative for back pain.  Skin: Negative for rash and wound.  Neurological: Negative for syncope, weakness, light-headedness, numbness and headaches.  Psychiatric/Behavioral: Positive for confusion. Negative for agitation and hallucinations.  All other systems reviewed and are negative.    Physical Exam Updated Vital Signs BP 154/78 (BP Location: Right Arm)   Pulse 73   Temp (!) 94.9 F (34.9 C) (Rectal)   Resp 20   Ht 5\' 2"  (1.575 m)   Wt 124 lb (56.2 kg)   SpO2 97%   BMI 22.68 kg/m   Physical Exam  Constitutional: She appears well-developed and well-nourished. No distress.  HENT:  Head: Normocephalic and atraumatic.  Mouth/Throat: Oropharynx is clear and moist. No oropharyngeal exudate.  Eyes: Conjunctivae and EOM are normal. Pupils are equal, round, and reactive to light.  Neck: Normal range of motion. Neck supple.  Cardiovascular: Normal rate, regular rhythm and intact distal pulses.   No murmur heard. Pulmonary/Chest: Effort normal and breath sounds normal. No stridor. No respiratory distress. She has no wheezes. She exhibits no tenderness.  Abdominal: Soft. There is no tenderness.  Musculoskeletal: She exhibits no edema or tenderness.  Neurological: She is alert. She is disoriented. She displays no tremor. No cranial nerve deficit or sensory deficit. She exhibits normal muscle tone.  Skin: Skin is warm and dry. Capillary refill takes less than 2 seconds. No rash noted. No erythema.  Psychiatric: She has a normal mood and affect.  Nursing note and vitals reviewed.    ED Treatments / Results  Labs (all labs ordered are listed, but only abnormal results are displayed) Labs Reviewed  CBC WITH DIFFERENTIAL/PLATELET - Abnormal; Notable for the  following:       Result Value   WBC 10.6 (*)    RBC 3.30 (*)    Hemoglobin 9.8 (*)    HCT 29.7 (*)    Neutro Abs 9.0 (*)    All other components within normal limits  COMPREHENSIVE METABOLIC PANEL - Abnormal; Notable for the following:    Creatinine, Ser 1.23 (*)    Calcium 7.9 (*)    Total Protein 5.1 (*)    Albumin 1.8 (*)    GFR calc non Af Amer 44 (*)    GFR calc Af Amer 51 (*)    All other components within normal limits  URINALYSIS, ROUTINE W REFLEX MICROSCOPIC - Abnormal; Notable for the following:    Glucose, UA 50 (*)    Protein, ur >=300 (*)    Bacteria, UA MANY (*)    All other components within normal limits  CBG MONITORING, ED - Abnormal; Notable for the following:    Glucose-Capillary 128 (*)    All other components within normal limits  CBG MONITORING, ED - Abnormal; Notable for the following:    Glucose-Capillary 110 (*)    All other components within normal limits  CBG MONITORING, ED - Abnormal; Notable for the following:    Glucose-Capillary 176 (*)    All other components within normal limits  URINE CULTURE  CULTURE, BLOOD (ROUTINE X 2)  CULTURE, BLOOD (ROUTINE X 2)  CULTURE, EXPECTORATED SPUTUM-ASSESSMENT  GRAM STAIN  LIPASE, BLOOD  LEGIONELLA PNEUMOPHILA SEROGP 1 UR AG  STREP PNEUMONIAE URINARY ANTIGEN  LACTIC ACID, PLASMA  COMPREHENSIVE METABOLIC PANEL  CBC  AMMONIA  T4, FREE  HEMOGLOBIN A1C  T3  TSH  I-STAT CG4 LACTIC  ACID, ED  I-STAT CG4 LACTIC ACID, ED    EKG  EKG Interpretation  Date/Time:  Monday December 23 2016 07:53:09 EST Ventricular Rate:  73 PR Interval:    QRS Duration: 92 QT Interval:  426 QTC Calculation: 470 R Axis:   70 Text Interpretation:  Sinus rhythm Ventricular premature complex Aberrant conduction of SV complex(es) Low voltage, precordial leads More artifact when compared to prior.  No STEMI Confirmed by Rush Landmark MD, CHRISTOPHER [phone]) on 12/23/2016 9:57:23 AM       Radiology Dg Chest 2 View  Result Date:  12/23/2016 CLINICAL DATA:  Hyperglycemia.  Chills. EXAM: CHEST  2 VIEW COMPARISON:  12/02/2016. FINDINGS: Mediastinum and hilar structures are normal. Low lung volumes. Bibasilar infiltrates. Interim clearing of bilateral pleural effusions. No pneumothorax. Prior cervical spine fusion . IMPRESSION: Bibasilar infiltrates suggesting pneumonia . Interim clearing of bilateral pleural effusions. Electronically Signed   By: Maisie Fus  Register   On: 12/23/2016 08:41    Procedures Procedures (including critical care time)  Medications Ordered in ED Medications  levothyroxine (SYNTHROID, LEVOTHROID) tablet 112 mcg (not administered)  ondansetron (ZOFRAN-ODT) disintegrating tablet 4 mg (not administered)  famotidine (PEPCID) tablet 20 mg (20 mg Oral Given 12/23/16 1357)  amLODipine (NORVASC) tablet 5 mg (5 mg Oral Given 12/23/16 1357)  clopidogrel (PLAVIX) tablet 75 mg (75 mg Oral Given 12/23/16 1357)  aspirin chewable tablet 81 mg (81 mg Oral Given 12/23/16 1357)  carvedilol (COREG) tablet 3.125 mg (not administered)  iron polysaccharides (NIFEREX) capsule 150 mg (not administered)  atorvastatin (LIPITOR) tablet 10 mg (not administered)  meclizine (ANTIVERT) tablet 25 mg (not administered)  heparin injection 5,000 Units (5,000 Units Subcutaneous Not Given 12/23/16 1612)  insulin aspart (novoLOG) injection 0-9 Units (2 Units Subcutaneous Given 12/23/16 1337)  escitalopram (LEXAPRO) tablet 15 mg (not administered)  vancomycin (VANCOCIN) IVPB 1000 mg/200 mL premix (0 mg Intravenous Stopped 12/23/16 1358)    Followed by  vancomycin (VANCOCIN) IVPB 1000 mg/200 mL premix (not administered)  guaiFENesin (MUCINEX) 12 hr tablet 600 mg (600 mg Oral Not Given 12/23/16 1611)  aztreonam (AZACTAM) 1 g in dextrose 5 % 50 mL IVPB (not administered)     Initial Impression / Assessment and Plan / ED Course  I have reviewed the triage vital signs and the nursing notes.  Pertinent labs & imaging results that were available during  my care of the patient were reviewed by me and considered in my medical decision making (see chart for details).  Clinical Course     ASLYN COTTMAN is a 71 y.o. female with a past medical history significant for diabetes with history of DKA, CK D, hypertension, CHF, COPD, GERD, and prior stroke on aspirin and Plavix and recent urinary tract infection who presents from her nursing facility for sonorous respirations and hypoglycemia in the setting of chills, emesis, and some bladder spasms.  History and exam are seen above.   On exam, patient has no abdominal tenderness. Patient's lungs are clear. Patient's abdomen and chest are nontender. Patient had no significant lower extremity edema and some mild bruising on the left foot. Patient says that she has been assessed for this and has no injury. She does not represent any injuries to her left foot. Patient has symmetric pulses in her upper extremities and lower extremity. Patient denied any neurologic complaints and had no focal deficits.   Patient is alert and oriented 1 to self. She does not know where she is or what year it is. Given patient's  recent UTI and altered mental status, patient will have workup to look for recurrent infection. Given recent emesis and decreased by mouth intake, suspect dehydration and hypoglycemia secondary to no intake with normal medication use. Patient will have CBG checks every hour for several hours. Patient will also have labs checked to look for abnormalities with liver, pancreas, or other abdominal problems. Patient will EKG for altered mental status.  10:11 AM Patient's diagnostic testing returned as above. Vision found to have elevated white blood cell count of 10.6. Urinalysis appears similar to prior with bacteria and protein but no evidence of nitrites or leukocytes. Initial lactic acid is not elevated and kidney function is slightly improved from prior.  Chest x-ray does however show evidence of  pneumonia.  Patient was informed of this finding and then she reports that she was indeed having a new cough today that was productive of a phlegm sputum.  Review shows that patient was admitted in December for UTI as well as a 10 day admission in November for C. difficile. Given these recent admissions, patient will be treated for hospital associated pneumonia and admitted.  Patient admitted in stable condition for pneumonia.    Final Clinical Impressions(s) / ED Diagnoses   Final diagnoses:  Healthcare-associated pneumonia     Clinical Impression: 1. Healthcare-associated pneumonia   2. HCAP (healthcare-associated pneumonia)     Disposition: Admit to Hospitalist service    Heide Scaleshristopher J Tegeler, MD 12/23/16 [phone]

## 2016-12-23 NOTE — ED Notes (Signed)
Patient has returned from being out of the department; patient placed back on monitor, continuous pulse oximetry and blood pressure cuff 

## 2016-12-23 NOTE — ED Triage Notes (Signed)
Brought via ems from [facility] after found having snorous respirations.  CBG checked by staff and found to be 34.  Given glucagon injection and oral glucose.  Rechecked en route and noted to be 130.  Patient noted to be altered at facility but now answering questions.  Also noted to have coarse lung sounds.  Hx of CHF.

## 2016-12-23 NOTE — ED Notes (Signed)
POCT CBG resulted 110; Alphonzo LemmingsWhitney, RN

## 2016-12-23 NOTE — Progress Notes (Signed)
CRITICAL VALUE ALERT  Critical value received:  Lactic acid 2.6  Date of notification:  12/23/2016  Time of notification:  1035pm Critical value read back:Yes.    Nurse who received alert:  Lutricia Horsfallourtney Safina Huard RN  MD notified (1st page):  Craige CottaKirby NP  Time of first page:  1040 pm  MD notified (2nd page):  Time of second page:  Responding MD: Craige CottaKirby NP  Time MD responded:  1045

## 2016-12-23 NOTE — ED Notes (Signed)
Report given to 6E RN 

## 2016-12-23 NOTE — ED Notes (Signed)
POCT CBG resulted 176; Alphonzo LemmingsWhitney, RN aware

## 2016-12-23 NOTE — ED Notes (Signed)
POCT CBG resulted 128; Alphonzo LemmingsWhitney, RN present

## 2016-12-23 NOTE — ED Notes (Signed)
Whitney, RN assisted me with in and out cath; was successful and sent urine to lab for testing

## 2016-12-23 NOTE — ED Notes (Signed)
Voicemail left for daughter that pt was moved to inpt bed.

## 2016-12-23 NOTE — H&P (Signed)
History and Physical    Cassandra [name]:[id number] DOB: 05/09/1946 DOA: 12/23/2016   PCP: Irving Copas, MD   Patient coming from:   SNF    Chief Complaint: Acute confusion   HPI: Cassandra [name] is a 71 y.o. female with a history of diabetes, with prior DKA, CKD , hypertension, CHF, COPD, GERD, recent h/o C. Diff 10/2016, hyopthyroidism,  prior history of stroke on aspirin and Plavix, and recent UTI presenting from her nursing facility due to decreased mentation, and increased work of breathing, without productive cough, in the setting of chills MSAs for the last few days. She denies any fever or chills. She denies any nausea or vomiting. She does have decreased oral intake. She denies any diarrhea, she does report chronic constipation. She denies any dysuria, hematuria or changes in her urine color no cardiac complaints. She denies myalgia. On transport, she was noted to have very low blood sugar levels in the 30s, receiving D5 with quick improvement of her mental status. On transport, there were no signs of seizures, agitation, or weakness. She was never and responsive. She denies any lower extremity swelling. She does not recall any sick contacts at the nursing home.  ED Course:  BP 154/78 (BP Location: Right Arm)   Pulse 73   Temp (!) 94.9 F (34.9 C) (Rectal)   Resp 20   Ht 5\' 2"  (1.575 m)   Wt 56.2 kg (124 lb)   SpO2 97%   BMI 22.68 kg/m    sodium 141 potassium 3.9 BUN 20 creatinine 1.23 annual H alkaline phosphatase 112 albumin 1.8  Bilirubin 0.5  troponin less than 0.03 lactic acid was initially 1.49, now 1.07. CBC shows white count 10.6 hemoglobin 9.8 platelets 333 CXR  Bibasilar infiltrates suggesting pneumonia .   Review of Systems: As per HPI otherwise 10 point review of systems negative.   Past Medical History:  Diagnosis Date  . Allergic rhinitis   . Anemia   . Anxiety   . Arthritis    "mostly feet, hands" (10/17/2016)  . Benign hypertension with CKD  (chronic kidney disease) stage III   . Benign paroxysmal positional vertigo   . Bipolar disorder (HCC)   . Broken finger   . Broken shoulder   . Broken toes   . Cervicalgia   . CHF (congestive heart failure) (HCC) 07/2016  . CKD (chronic kidney disease) stage 3, GFR 30-59 ml/min   . COPD (chronic obstructive pulmonary disease) (HCC)   . Depression   . Elevated liver enzymes Hep B/C neg 2014  . Fall at nursing home 10/15/2016  . GERD (gastroesophageal reflux disease)   . Glaucoma   . High cholesterol   . History of alcohol use   . History of blood transfusion 09/2016   "blood got really really low"  . Hypertension   . Hypothyroidism   . Interstitial cystitis    bladder stretched every 9 months  . Migraines    "pretty much qd" (10/17/2016)  . Psoriasis   . Stroke (HCC) 05/16/2016   Left occipital and thalamic, right hippocampal  . Thyroid disease   . Tobacco use   . 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 Vitamin B12 deficiency     Past Surgical History:  Procedure Laterality Date  . ANTERIOR CERVICAL DECOMP/DISCECTOMY FUSION  ~ 2009  . APPENDECTOMY    . BACK SURGERY    . BLADDER SUSPENSION    . CATARACT EXTRACTION W/ INTRAOCULAR LENS  IMPLANT, BILATERAL Bilateral   . DILATION AND CURETTAGE OF UTERUS    . HERNIA REPAIR    . LAPAROSCOPIC CHOLECYSTECTOMY    . TONSILLECTOMY    . TUBAL LIGATION    . VAGINAL HYSTERECTOMY     with oophorectomy    Social History Social History   Social History  . Marital status: Married    Spouse name: N/A  . Number of children: N/A  . Years of education: N/A   Occupational History  . Not on file.   Social History Main Topics  . Smoking status: Former Smoker    Packs/day: 0.25    Years: 10.00    Types: Cigarettes    Quit date: 12/1923  . Smokeless tobacco: Never Used  . Alcohol use No     Comment: Hasn't had any alcohol "for 2 yrs" (10/17/2016)  . Drug use: No  . Sexual activity: Yes   Other Topics  Concern  . Not on file   Social History Narrative   Patient has been married for 13 years and lives with spouse, has 2 children, 3 grandchildren, and 1 great grandchild. She does not use walker/cane, does not clean house, does not shop, does not do yard work, and does not drive. She is retired. Previous occupation was Furniture conservator/restorer. Completed [employer].         Epwoth Sleepiness Scale Score:  7      --I have high blood pressure   --I have had insomnia   --I seem to be losing my sex drive   --I feel stressed and lack motivation   --I wake up to urinate frequently at night   --I wake up with a dry mouth or sore throat   --I feel excessively sleepy and tired throughout the day   --I have Diabetes   --I have been told that I snore   --I have problems with memory/concentration   --I frequently awake with headaches         Allergies  Allergen Reactions  . Alprazolam Other (See Comments)    Family preference, for patient to not take med  . Percocet [Oxycodone-Acetaminophen] Other (See Comments)    Family preference, for patient to not take med  . Codeine Diarrhea and Nausea And Vomiting  . Doxycycline Diarrhea and Nausea And Vomiting  . Hydrocodone Nausea And Vomiting  . Omnicef [Cefdinir] Nausea Only and Other (See Comments)    Constipation, tolerated Zosyn  . Augmentin [Amoxicillin-Pot Clavulanate] Hives and Rash    Has patient had a PCN reaction causing immediate rash, facial/tongue/throat swelling, SOB or lightheadedness with hypotension: Yes Has patient had a PCN reaction causing severe rash involving mucus membranes or skin necrosis: Yes Did a PCN reaction that required hospitalization No Did PCN reaction occurring within the last 10 years: Yes If all of the above answers are "NO", then may proceed with Cephalosporin use.  Pt states she has taken penicillin since, and was ok with it...  . Ciprofloxacin Hives    Family History  Problem Relation Age of Onset    . Alcohol abuse Mother   . Arthritis Mother   . Asthma Mother   . Cancer Mother     colon cancer  . Hypertension Mother   . Migraines Mother   . Stroke Mother   . Lung disease Mother   . COPD Mother   . Diabetes Father   . Hypertension Father   . Heart disease Father   . Heart attack  Father   . Heart disease Paternal Grandmother   . Diabetes Paternal Grandmother   . Stroke Paternal Grandmother   . Cancer Paternal Grandmother   . Diabetes Paternal Grandfather       Prior to Admission medications   Medication Sig Start Date End Date Taking? Authorizing Provider  acetaminophen (TYLENOL) 325 MG tablet Take 650 mg by mouth every 6 (six) hours as needed for mild pain or fever.    Yes Historical Provider, MD  Amino Acids-Protein Hydrolys (FEEDING SUPPLEMENT, PRO-STAT SUGAR FREE 64,) LIQD Take 30 mLs by mouth 2 (two) times daily.   Yes Historical Provider, MD  amLODipine (NORVASC) 5 MG tablet Take 5 mg by mouth daily.    Yes Historical Provider, MD  aspirin 81 MG chewable tablet Chew 1 tablet (81 mg total) by mouth daily. 09/24/16  Yes Vassie Loll, MD  atorvastatin (LIPITOR) 10 MG tablet Take 1 tablet (10 mg total) by mouth daily at 6 PM. 05/19/16  Yes Rolly Salter, MD  carvedilol (COREG) 3.125 MG tablet Take 1 tablet (3.125 mg total) by mouth 2 (two) times daily with a meal. 07/18/16  Yes Mauricio Annett Gula, MD  clopidogrel (PLAVIX) 75 MG tablet Take 75 mg by mouth daily.   Yes Historical Provider, MD  escitalopram (LEXAPRO) 10 MG tablet Take 10 mg by mouth daily. Take with 5 mg to equal 15 mg   Yes Historical Provider, MD  escitalopram (LEXAPRO) 5 MG tablet Take 5 mg by mouth daily. Take with 10 mg to equal 15 mg   Yes Historical Provider, MD  famotidine (PEPCID) 20 MG tablet Take 1 tablet (20 mg total) by mouth daily. 10/22/16  Yes Osvaldo Shipper, MD  insulin glargine (LANTUS) 100 UNIT/ML injection Inject 8 Units into the skin at bedtime.   Yes Historical Provider, MD  insulin  lispro (HUMALOG) 100 UNIT/ML injection Inject 0.01-0.05 mLs (1-5 Units total) into the skin 3 (three) times daily before meals. For CBGs < 150 = 0 units; 151 - 200 = 1 units, 201 - 250 = 2 units, 251 - 300 = 3 units; 301-350 = 4 units; 351-400 = 5 units; > 400 notify the provider. 11/21/16  Yes Dinah C Ngetich, NP  iron polysaccharides (NIFEREX) 150 MG capsule Take 1 capsule (150 mg total) by mouth 2 (two) times daily. 06/28/16  Yes Vassie Loll, MD  levothyroxine (SYNTHROID, LEVOTHROID) 112 MCG tablet Take 112 mcg by mouth daily before breakfast.   Yes Historical Provider, MD  NUTRITIONAL SUPPLEMENT LIQD Give Med Pass 2.0  Sugar free 120 mls by mouth three times daily between meals as a feeding supplement.   Yes Historical Provider, MD  nystatin-triamcinolone ointment (MYCOLOG) Apply ointment every shift to perianal / buttocks for rash   Yes Historical Provider, MD  traMADol (ULTRAM) 50 MG tablet Take 1 tablet (50 mg total) by mouth every 6 (six) hours as needed. 12/04/16  Yes Wyatt Haste, MD  escitalopram (LEXAPRO) 20 MG tablet Take 20 mg by mouth at bedtime.     Historical Provider, MD  insulin lispro (HUMALOG) 100 UNIT/ML injection Inject 1-5 Units into the skin once.  12/12/16 12/13/16  Historical Provider, MD  meclizine (ANTIVERT) 25 MG tablet Take 1 tablet (25 mg total) by mouth 3 (three) times daily as needed for dizziness. 02/12/16   Chilton Si, MD  ondansetron (ZOFRAN ODT) 4 MG disintegrating tablet Take 1 tablet (4 mg total) by mouth every 8 (eight) hours as needed for nausea or vomiting. 12/02/16  Sharman Cheek, MD    Physical Exam:    Vitals:   12/23/16 1610 12/23/16 0749 12/23/16 0754 12/23/16 0841  BP:   165/77 154/78  Pulse:   75 73  Resp:   22 20  Temp:  (!) 94.9 F (34.9 C)    TempSrc:  Rectal    SpO2:   100% 97%  Weight: 56.2 kg (124 lb)     Height: 5\' 2"  (1.575 m)          Constitutional: NAD, calm, comfortable   Vitals:   12/23/16 0659 12/23/16 0749  12/23/16 0754 12/23/16 0841  BP:   165/77 154/78  Pulse:   75 73  Resp:   22 20  Temp:  (!) 94.9 F (34.9 C)    TempSrc:  Rectal    SpO2:   100% 97%  Weight: 56.2 kg (124 lb)     Height: 5\' 2"  (1.575 m)      Eyes: PERRL, lids and conjunctivae normal ENMT: Mucous membranes are moist. Posterior pharynx clear of any exudate or lesions.Normal dentition.  Neck: normal, supple, no masses, no thyromegaly Respiratory: slightly decreasedbreath sounds at the bases  bilaterally, no wheezing, no crackles. Normal respiratory effort. No accessory muscle use.  Cardiovascular: Regular rate and rhythm, no murmurs / rubs / gallops. No extremity edema. 2+ pedal pulses. No carotid bruits.  Abdomen: no tenderness, no masses palpated. No hepatosplenomegaly. Bowel sounds positive.  Musculoskeletal: no clubbing / cyanosis. No joint deformity upper and lower extremities. Good ROM, no contractures. Normal muscle tone.  Skin: no rashes, lesions, ulcers. Some bruising noted at the right foot after hitting furniture  Neurologic: CN 2-12 grossly intact. Sensation intact, DTR normal. Strength 5/5 in all 4.  Psychiatric: Normal judgment and insight. Alert and oriented x 3. Normal mood.     Labs on Admission: I have personally reviewed following labs and imaging studies  CBC:  Recent Labs Lab 12/23/16 0914  WBC 10.6*  NEUTROABS 9.0*  HGB 9.8*  HCT 29.7*  MCV 90.0  PLT 333    Basic Metabolic Panel:  Recent Labs Lab 12/23/16 0914  NA 141  K 3.9  CL 106  CO2 27  GLUCOSE 99  BUN 20  CREATININE 1.23*  CALCIUM 7.9*    GFR: Estimated Creatinine Clearance: 34.1 mL/min (by C-G formula based on SCr of 1.23 mg/dL (H)).  Liver Function Tests:  Recent Labs Lab 12/23/16 0914  AST 31  ALT 21  ALKPHOS 112  BILITOT 0.5  PROT 5.1*  ALBUMIN 1.8*    Recent Labs Lab 12/23/16 0914  LIPASE 23   No results for input(s): AMMONIA in the last 168 hours.  Coagulation Profile: No results for  input(s): INR, PROTIME in the last 168 hours.  Cardiac Enzymes: No results for input(s): CKTOTAL, CKMB, CKMBINDEX, TROPONINI in the last 168 hours.  BNP (last 3 results) No results for input(s): PROBNP in the last 8760 hours.  HbA1C: No results for input(s): HGBA1C in the last 72 hours.  CBG:  Recent Labs Lab 12/23/16 0745 12/23/16 1053  GLUCAP 128* 110*    Lipid Profile: No results for input(s): CHOL, HDL, LDLCALC, TRIG, CHOLHDL, LDLDIRECT in the last 72 hours.  Thyroid Function Tests: No results for input(s): TSH, T4TOTAL, FREET4, T3FREE, THYROIDAB in the last 72 hours.  Anemia Panel: No results for input(s): VITAMINB12, FOLATE, FERRITIN, TIBC, IRON, RETICCTPCT in the last 72 hours.  Urine analysis:    Component Value Date/Time   COLORURINE YELLOW 12/23/2016 9604  APPEARANCEUR CLEAR 12/23/2016 0807   APPEARANCEUR Clear 06/15/2014 1508   LABSPEC 1.010 12/23/2016 0807   LABSPEC 1.010 06/15/2014 1508   PHURINE 7.0 12/23/2016 0807   GLUCOSEU 50 (A) 12/23/2016 0807   GLUCOSEU Negative 06/15/2014 1508   HGBUR NEGATIVE 12/23/2016 0807   BILIRUBINUR NEGATIVE 12/23/2016 0807   BILIRUBINUR Negative 06/15/2014 1508   KETONESUR NEGATIVE 12/23/2016 0807   PROTEINUR >=300 (A) 12/23/2016 0807   UROBILINOGEN 0.2 09/14/2015 1617   NITRITE NEGATIVE 12/23/2016 0807   LEUKOCYTESUR NEGATIVE 12/23/2016 0807   LEUKOCYTESUR Negative 06/15/2014 1508    Sepsis Labs: @LABRCNTIP (procalcitonin:4,lacticidven:4) )No results found for this or any previous visit (from the past 240 hour(s)).   Radiological Exams on Admission: Dg Chest 2 View  Result Date: 12/23/2016 CLINICAL DATA:  Hyperglycemia.  Chills. EXAM: CHEST  2 VIEW COMPARISON:  12/02/2016. FINDINGS: Mediastinum and hilar structures are normal. Low lung volumes. Bibasilar infiltrates. Interim clearing of bilateral pleural effusions. No pneumothorax. Prior cervical spine fusion . IMPRESSION: Bibasilar infiltrates suggesting  pneumonia . Interim clearing of bilateral pleural effusions. Electronically Signed   By: Maisie Fushomas  Register   On: 12/23/2016 08:41    EKG: Independently reviewed.  Assessment/Plan Active Problems:   Insulin dependent type 2 diabetes mellitus, uncontrolled (HCC)   Depression   CKD (chronic kidney disease) stage 3, GFR 30-59 ml/min   Uncontrolled Hypothyroidism   Vitamin B12 deficiency   Benign paroxysmal positional vertigo   Anxiety   Glaucoma   Tobacco abuse   History of alcohol use   History of CVA (cerebrovascular accident)   HLD (hyperlipidemia)   Anemia of chronic disease   Physical deconditioning   Chronic diastolic heart failure (HCC)   Gastroesophageal reflux disease   Hypoglycemia    sodium 141 potassium 3.9 BUN 20 creatinine 1.23 annual H alkaline phosphatase 112 albumin 1 point HD Rubin 0.5 troponin less than 0.03 lactic acid was initially 1.49, now 1.07. CBC shows white count 10.6 hemoglobin 9.8 platelets 333 CXR  Bibasilar infiltrates suggesting pneumonia .     Incidental HCAP per  CXR  mildly symptomatic with cough  Lactic acid normal  WBC 10.6  Afebrile. Vital Signs stable. Recent hospitalization for UTI in Dec 2017, and C.diff diarrhea in Nov 2017.  Given Vanc and Cefepime at the ED  Pneumonia order set  IV Aztreonam (rash with PCN)  and Vanco per order set recommendations Mucinex prn  Repeat CXR in am   Acute Confusional State likely due  to Hypoglycemia on transport (c0ontrolled with D5) , in the setting of incidental pneumonia found in XR as above. Mental status improving. No seizures noted  Afebrile. WBC mildly elevated at 10.6 , VSS. UA reassuring.  Latic acid normal . Lactic acid BCX  BMET and CBG monitoring Ammonia levels   Hold home sedative medications   Anemia of chronic disease Hemoglobin on admission 9.8 at baseline, without bleeding issues  Repeat CBC in am  Continue Iron supplements  Hypertension BP 154/78 Pulse 73    Controlled Continue home  anti-hypertensive medications   Type II Diabetes Current blood sugar level is 99 Lab Results  Component Value Date   HGBA1C 7.5 11/18/2016   HGBA1C 7.5 11/18/2016  Hold Lantus  SSI Heart healthy carb modified diet.  Prior history of CVA, no acute issues  Continue ASA and plavix   Hypothyroidism: Check her TSH And T3/T4 levels in view of her recent AMS  -Continue home Synthroid   Depression Continue home Lexapro, at 20 mg qhs and  15 mg daily      DVT prophylaxis:   Heparin  Code Status:   Full     Family Communication:  Discussed with patient Disposition Plan: Expect patient to be discharged to home after condition improves Consults called:    None Admission status:  Medsurg     Allannah Kempen E, PA-C [facility]   12/23/2016, 10:59 AM

## 2016-12-23 NOTE — Progress Notes (Signed)
CRITICAL VALUE ALERT  Critical value received:  Lactic acid 2.5  Date of notification:  12/23/16  Time of notification:  1858  Critical value read back:Yes.    Nurse who received alert:  Matthew SarasH Deondray Ospina, RN  MD notified (1st page):  D. Merrell  Time of first page:  1859  MD notified (2nd page): Craige CottaKirby  Time of second page: 1916  Responding MD:    Time MD responded:

## 2016-12-23 NOTE — ED Notes (Signed)
Patient being transported to xray at this time 

## 2016-12-24 ENCOUNTER — Observation Stay (HOSPITAL_COMMUNITY): Payer: Medicare Other

## 2016-12-24 DIAGNOSIS — I130 Hypertensive heart and chronic kidney disease with heart failure and stage 1 through stage 4 chronic kidney disease, or unspecified chronic kidney disease: Secondary | ICD-10-CM | POA: Diagnosis present

## 2016-12-24 DIAGNOSIS — E10649 Type 1 diabetes mellitus with hypoglycemia without coma: Secondary | ICD-10-CM | POA: Diagnosis present

## 2016-12-24 DIAGNOSIS — Z823 Family history of stroke: Secondary | ICD-10-CM | POA: Diagnosis not present

## 2016-12-24 DIAGNOSIS — G9349 Other encephalopathy: Secondary | ICD-10-CM | POA: Diagnosis present

## 2016-12-24 DIAGNOSIS — J189 Pneumonia, unspecified organism: Secondary | ICD-10-CM | POA: Diagnosis not present

## 2016-12-24 DIAGNOSIS — E1043 Type 1 diabetes mellitus with diabetic autonomic (poly)neuropathy: Secondary | ICD-10-CM | POA: Diagnosis present

## 2016-12-24 DIAGNOSIS — R112 Nausea with vomiting, unspecified: Secondary | ICD-10-CM | POA: Diagnosis not present

## 2016-12-24 DIAGNOSIS — F05 Delirium due to known physiological condition: Secondary | ICD-10-CM | POA: Diagnosis present

## 2016-12-24 DIAGNOSIS — Y95 Nosocomial condition: Secondary | ICD-10-CM | POA: Diagnosis present

## 2016-12-24 DIAGNOSIS — Z800 Family history of malignant neoplasm of digestive organs: Secondary | ICD-10-CM | POA: Diagnosis not present

## 2016-12-24 DIAGNOSIS — E785 Hyperlipidemia, unspecified: Secondary | ICD-10-CM | POA: Diagnosis present

## 2016-12-24 DIAGNOSIS — Z833 Family history of diabetes mellitus: Secondary | ICD-10-CM | POA: Diagnosis not present

## 2016-12-24 DIAGNOSIS — E1022 Type 1 diabetes mellitus with diabetic chronic kidney disease: Secondary | ICD-10-CM | POA: Diagnosis present

## 2016-12-24 DIAGNOSIS — K3184 Gastroparesis: Secondary | ICD-10-CM | POA: Diagnosis present

## 2016-12-24 DIAGNOSIS — Z825 Family history of asthma and other chronic lower respiratory diseases: Secondary | ICD-10-CM | POA: Diagnosis not present

## 2016-12-24 DIAGNOSIS — E118 Type 2 diabetes mellitus with unspecified complications: Secondary | ICD-10-CM | POA: Diagnosis not present

## 2016-12-24 DIAGNOSIS — Z8673 Personal history of transient ischemic attack (TIA), and cerebral infarction without residual deficits: Secondary | ICD-10-CM | POA: Diagnosis not present

## 2016-12-24 DIAGNOSIS — E039 Hypothyroidism, unspecified: Secondary | ICD-10-CM | POA: Diagnosis present

## 2016-12-24 DIAGNOSIS — G9340 Encephalopathy, unspecified: Secondary | ICD-10-CM | POA: Diagnosis present

## 2016-12-24 DIAGNOSIS — Z811 Family history of alcohol abuse and dependence: Secondary | ICD-10-CM | POA: Diagnosis not present

## 2016-12-24 DIAGNOSIS — D638 Anemia in other chronic diseases classified elsewhere: Secondary | ICD-10-CM | POA: Diagnosis not present

## 2016-12-24 DIAGNOSIS — Z90710 Acquired absence of both cervix and uterus: Secondary | ICD-10-CM | POA: Diagnosis not present

## 2016-12-24 DIAGNOSIS — E162 Hypoglycemia, unspecified: Secondary | ICD-10-CM | POA: Diagnosis not present

## 2016-12-24 DIAGNOSIS — E7800 Pure hypercholesterolemia, unspecified: Secondary | ICD-10-CM | POA: Diagnosis present

## 2016-12-24 DIAGNOSIS — I5032 Chronic diastolic (congestive) heart failure: Secondary | ICD-10-CM | POA: Diagnosis not present

## 2016-12-24 DIAGNOSIS — Z8261 Family history of arthritis: Secondary | ICD-10-CM | POA: Diagnosis not present

## 2016-12-24 DIAGNOSIS — K219 Gastro-esophageal reflux disease without esophagitis: Secondary | ICD-10-CM | POA: Diagnosis present

## 2016-12-24 DIAGNOSIS — J440 Chronic obstructive pulmonary disease with acute lower respiratory infection: Secondary | ICD-10-CM | POA: Diagnosis present

## 2016-12-24 DIAGNOSIS — Z8249 Family history of ischemic heart disease and other diseases of the circulatory system: Secondary | ICD-10-CM | POA: Diagnosis not present

## 2016-12-24 DIAGNOSIS — N183 Chronic kidney disease, stage 3 (moderate): Secondary | ICD-10-CM | POA: Diagnosis present

## 2016-12-24 LAB — CBC WITH DIFFERENTIAL/PLATELET
BASOS PCT: 1 %
Basophils Absolute: 0.1 10*3/uL (ref 0.0–0.1)
EOS ABS: 0.4 10*3/uL (ref 0.0–0.7)
Eosinophils Relative: 7 %
HCT: 24.7 % — ABNORMAL LOW (ref 36.0–46.0)
Hemoglobin: 8.2 g/dL — ABNORMAL LOW (ref 12.0–15.0)
LYMPHS ABS: 1.2 10*3/uL (ref 0.7–4.0)
Lymphocytes Relative: 19 %
MCH: 29.9 pg (ref 26.0–34.0)
MCHC: 33.2 g/dL (ref 30.0–36.0)
MCV: 90.1 fL (ref 78.0–100.0)
Monocytes Absolute: 0.3 10*3/uL (ref 0.1–1.0)
Monocytes Relative: 5 %
NEUTROS PCT: 68 %
Neutro Abs: 4.4 10*3/uL (ref 1.7–7.7)
Platelets: 257 10*3/uL (ref 150–400)
RBC: 2.74 MIL/uL — AB (ref 3.87–5.11)
RDW: 13.8 % (ref 11.5–15.5)
WBC: 6.3 10*3/uL (ref 4.0–10.5)

## 2016-12-24 LAB — COMPREHENSIVE METABOLIC PANEL
ALT: 16 U/L (ref 14–54)
AST: 23 U/L (ref 15–41)
Albumin: 1.5 g/dL — ABNORMAL LOW (ref 3.5–5.0)
Alkaline Phosphatase: 91 U/L (ref 38–126)
Anion gap: 8 (ref 5–15)
BILIRUBIN TOTAL: 0.4 mg/dL (ref 0.3–1.2)
BUN: 17 mg/dL (ref 6–20)
CO2: 23 mmol/L (ref 22–32)
Calcium: 7.2 mg/dL — ABNORMAL LOW (ref 8.9–10.3)
Chloride: 106 mmol/L (ref 101–111)
Creatinine, Ser: 1.25 mg/dL — ABNORMAL HIGH (ref 0.44–1.00)
GFR calc non Af Amer: 43 mL/min — ABNORMAL LOW (ref >60)
GFR, EST AFRICAN AMERICAN: 50 mL/min — AB (ref >60)
Glucose, Bld: 129 mg/dL — ABNORMAL HIGH (ref 65–99)
POTASSIUM: 4 mmol/L (ref 3.5–5.1)
Sodium: 137 mmol/L (ref 135–145)
TOTAL PROTEIN: 3.9 g/dL — AB (ref 6.5–8.1)

## 2016-12-24 LAB — CBC
HEMATOCRIT: 21.5 % — AB (ref 36.0–46.0)
Hemoglobin: 7.3 g/dL — ABNORMAL LOW (ref 12.0–15.0)
MCH: 30.5 pg (ref 26.0–34.0)
MCHC: 34 g/dL (ref 30.0–36.0)
MCV: 90 fL (ref 78.0–100.0)
PLATELETS: 233 10*3/uL (ref 150–400)
RBC: 2.39 MIL/uL — AB (ref 3.87–5.11)
RDW: 14 % (ref 11.5–15.5)
WBC: 7.8 10*3/uL (ref 4.0–10.5)

## 2016-12-24 LAB — BASIC METABOLIC PANEL
Anion gap: 6 (ref 5–15)
BUN: 20 mg/dL (ref 6–20)
CHLORIDE: 106 mmol/L (ref 101–111)
CO2: 25 mmol/L (ref 22–32)
Calcium: 7.5 mg/dL — ABNORMAL LOW (ref 8.9–10.3)
Creatinine, Ser: 1.38 mg/dL — ABNORMAL HIGH (ref 0.44–1.00)
GFR calc Af Amer: 44 mL/min — ABNORMAL LOW (ref >60)
GFR calc non Af Amer: 38 mL/min — ABNORMAL LOW (ref >60)
Glucose, Bld: 219 mg/dL — ABNORMAL HIGH (ref 65–99)
POTASSIUM: 3.9 mmol/L (ref 3.5–5.1)
SODIUM: 137 mmol/L (ref 135–145)

## 2016-12-24 LAB — PHOSPHORUS: Phosphorus: 4.1 mg/dL (ref 2.5–4.6)

## 2016-12-24 LAB — GLUCOSE, CAPILLARY
GLUCOSE-CAPILLARY: 196 mg/dL — AB (ref 65–99)
Glucose-Capillary: 262 mg/dL — ABNORMAL HIGH (ref 65–99)
Glucose-Capillary: 299 mg/dL — ABNORMAL HIGH (ref 65–99)
Glucose-Capillary: 239 mg/dL — ABNORMAL HIGH (ref 65–99)

## 2016-12-24 LAB — LACTATE DEHYDROGENASE: LDH: 266 U/L — AB (ref 98–192)

## 2016-12-24 LAB — HEMOGLOBIN A1C
Hgb A1c MFr Bld: 7.4 % — ABNORMAL HIGH (ref 4.8–5.6)
MEAN PLASMA GLUCOSE: 166 mg/dL

## 2016-12-24 LAB — MAGNESIUM: MAGNESIUM: 2.2 mg/dL (ref 1.7–2.4)

## 2016-12-24 LAB — FIBRINOGEN: Fibrinogen: 523 mg/dL — ABNORMAL HIGH (ref 210–475)

## 2016-12-24 LAB — T3: T3 TOTAL: 55 ng/dL — AB (ref 71–180)

## 2016-12-24 LAB — LACTIC ACID, PLASMA: LACTIC ACID, VENOUS: 1.4 mmol/L (ref 0.5–1.9)

## 2016-12-24 MED ORDER — INSULIN ASPART 100 UNIT/ML ~~LOC~~ SOLN
0.0000 [IU] | Freq: Three times a day (TID) | SUBCUTANEOUS | Status: DC
  Administered 2016-12-24: 2 [IU] via SUBCUTANEOUS
  Administered 2016-12-25 – 2016-12-27 (×2): 1 [IU] via SUBCUTANEOUS
  Administered 2016-12-27: 2 [IU] via SUBCUTANEOUS
  Administered 2016-12-28: 3 [IU] via SUBCUTANEOUS
  Administered 2016-12-28: 2 [IU] via SUBCUTANEOUS
  Administered 2016-12-28 – 2016-12-29 (×2): 1 [IU] via SUBCUTANEOUS

## 2016-12-24 MED ORDER — ACETAMINOPHEN 325 MG PO TABS
650.0000 mg | ORAL_TABLET | Freq: Four times a day (QID) | ORAL | Status: DC | PRN
  Administered 2016-12-24 – 2016-12-28 (×8): 650 mg via ORAL
  Filled 2016-12-24 (×9): qty 2

## 2016-12-24 MED ORDER — INSULIN GLARGINE 100 UNIT/ML ~~LOC~~ SOLN
4.0000 [IU] | Freq: Every day | SUBCUTANEOUS | Status: DC
  Administered 2016-12-24 – 2016-12-28 (×5): 4 [IU] via SUBCUTANEOUS
  Filled 2016-12-24 (×7): qty 0.04

## 2016-12-24 NOTE — Progress Notes (Signed)
Notified Dr.Choi of Hgb 7.3  Also that pt vomited this AM and given PO zofran. Pt did not eat breakfast.   Pt now denies nausea and no vomiting,   Will continue to monitor.   Leonia ReevesNatalie N Clayden Withem, RN

## 2016-12-24 NOTE — Progress Notes (Addendum)
PROGRESS NOTE    MAKENLY LARABEE  UJW:[id number] DOB: 1946-04-18 DOA: 12/23/2016 PCP: Irving Copas, MD     Brief Narrative:  MALLOREE RABOIN is a 71 y.o. female with a history of diabetes, with prior DKA, CKD , hypertension, CHF, COPD, GERD, recent h/o C. Diff 10/2016, hyopthyroidism,  prior history of stroke on aspirin and Plavix, and recent UTI who presented from her nursing facility due to decreased mentation, and increased work of breathing, without productive cough. On transport, she was noted to have very low blood sugar levels in the 30s, receiving D5 with quick improvement of her mental status.   Assessment & Plan:   Active Problems:   Insulin dependent type 2 diabetes mellitus, uncontrolled (HCC)   Depression   CKD (chronic kidney disease) stage 3, GFR 30-59 ml/min   Uncontrolled Hypothyroidism   Vitamin B12 deficiency   Benign paroxysmal positional vertigo   Anxiety   Glaucoma   Tobacco abuse   History of alcohol use   History of CVA (cerebrovascular accident)   HLD (hyperlipidemia)   Anemia of chronic disease   Physical deconditioning   Chronic diastolic heart failure (HCC)   Gastroesophageal reflux disease   Hypoglycemia   HAP (hospital-acquired pneumonia)   HCAP (healthcare-associated pneumonia)   Diabetes mellitus with complication (HCC)  Acute encephalopathy -Due to hypoglycemia and UTI -Improving   HCAP -Sepsis ruled out  -Strep pneumo antigen negative, legionella antigen pending  -Blood cultures pending -Sputum culture pending  -Check respiratory viral panel  -IV Aztreonam (rash with PCN). Will stop Vanco   UTI, present on admission, not catheter related  -GNR, identification and sensitivity pending  -Continue Aztreonam   Acute normocytic anemia on chronic anemia of chronic disease -Hemoglobin 9.8 --> 7.3 -No report of any blood loss in stool or vomitus -Check occult blood stool -Check LDH, fibrinogen, haptoglobin -Trend CBC    Diabetes mellitus type 1 -With hypoglycemia on presentation -Hemoglobin A1c 7.4 -Appreciate diabetic coordinator -Lantus 4 units daily -SSI   Essential hypertension -Continue Norvasc, coreg   History of CVA -Continue aspirin and Plavix. If Hgb drops further, will need to hold these   Hypothyroidism -TSH elevated, T4 normal -Continue Synthroid  Depression -Continue Lexapro   DVT prophylaxis: subq hep  Code Status: Full Family Communication: no family at bedside Disposition Plan: back to SNF    Consultants:   None  Procedures:   None  Antimicrobials:   Vanco 1/8 - 1/9  Aztreonam 1/8 >>>     Subjective: Patient states that she does not feel well. She is a poor historian and is unable to tell me further history. She is oriented to self only this morning. She tells me that she is in [place], but unable to tell me which hospital this is, tells me that it is year 2020. Admits to shortness of breath, cough. Denies any blood in stools  Objective: Vitals:   12/24/16 0517 12/24/16 0830 12/24/16 0856 12/24/16 1100  BP: (!) 146/76 (!) 159/75 (!) 151/61 (!) 157/71  Pulse: 82 83 85   Resp: 18  18   Temp: 98.4 F (36.9 C)  98.1 F (36.7 C)   TempSrc: Oral  Oral   SpO2: 92%  96%   Weight:      Height:        Intake/Output Summary (Last 24 hours) at 12/24/16 1300 Last data filed at 12/24/16 0927  Gross per 24 hour  Intake  260 ml  Output              400 ml  Net             -140 ml   Filed Weights   12/23/16 0659  Weight: 56.2 kg (124 lb)    Examination:  General exam: Appears calm and comfortable  Respiratory system: Clear to auscultation, bibasilar crackles. Respiratory effort normal. Cardiovascular system: S1 & S2 heard, RRR. No JVD, murmurs, rubs, gallops or clicks. No pedal edema. Gastrointestinal system: Abdomen is nondistended, soft and nontender. No organomegaly or masses felt. Normal bowel sounds heard. Central nervous system:  Alert and oriented to self only . No focal neurological deficits. Extremities: Symmetric 5 x 5 power. Skin: No rashes, lesions or ulcers Psychiatry: Judgement and insight appear normal. Mood & affect appropriate.   Data Reviewed: I have personally reviewed following labs and imaging studies  CBC:  Recent Labs Lab 12/23/16 0914 12/24/16 0115  WBC 10.6* 7.8  NEUTROABS 9.0*  --   HGB 9.8* 7.3*  HCT 29.7* 21.5*  MCV 90.0 90.0  PLT 333 233   Basic Metabolic Panel:  Recent Labs Lab 12/23/16 0914 12/24/16 0115  NA 141 137  K 3.9 4.0  CL 106 106  CO2 27 23  GLUCOSE 99 129*  BUN 20 17  CREATININE 1.23* 1.25*  CALCIUM 7.9* 7.2*   GFR: Estimated Creatinine Clearance: 33.6 mL/min (by C-G formula based on SCr of 1.25 mg/dL (H)). Liver Function Tests:  Recent Labs Lab 12/23/16 0914 12/24/16 0115  AST 31 23  ALT 21 16  ALKPHOS 112 91  BILITOT 0.5 0.4  PROT 5.1* 3.9*  ALBUMIN 1.8* 1.5*    Recent Labs Lab 12/23/16 0914  LIPASE 23    Recent Labs Lab 12/23/16 1753  AMMONIA 24   Coagulation Profile: No results for input(s): INR, PROTIME in the last 168 hours. Cardiac Enzymes: No results for input(s): CKTOTAL, CKMB, CKMBINDEX, TROPONINI in the last 168 hours. BNP (last 3 results) No results for input(s): PROBNP in the last 8760 hours. HbA1C:  Recent Labs  12/23/16 1753  HGBA1C 7.4*   CBG:  Recent Labs Lab 12/23/16 1238 12/23/16 1741 12/23/16 2128 12/24/16 0829 12/24/16 1256  GLUCAP 176* 146* 141* 262* 299*   Lipid Profile: No results for input(s): CHOL, HDL, LDLCALC, TRIG, CHOLHDL, LDLDIRECT in the last 72 hours. Thyroid Function Tests:  Recent Labs  12/23/16 1753  TSH 39.016*  FREET4 0.77   Anemia Panel: No results for input(s): VITAMINB12, FOLATE, FERRITIN, TIBC, IRON, RETICCTPCT in the last 72 hours. Sepsis Labs:  Recent Labs Lab 12/23/16 1047 12/23/16 1753 12/23/16 2142 12/24/16 0115  LATICACIDVEN 1.07 2.5* 2.6* 1.4    Recent  Results (from the past 240 hour(s))  Urine culture     Status: Abnormal (Preliminary result)   Collection Time: 12/23/16  8:07 AM  Result Value Ref Range Status   Specimen Description URINE, RANDOM  Final   Special Requests NONE  Final   Culture >=100,000 COLONIES/mL GRAM NEGATIVE RODS (A)  Final   Report Status PENDING  Incomplete  MRSA PCR Screening     Status: None   Collection Time: 12/23/16  8:26 PM  Result Value Ref Range Status   MRSA by PCR NEGATIVE NEGATIVE Final    Comment:        The GeneXpert MRSA Assay (FDA approved for NASAL specimens only), is one component of a comprehensive MRSA colonization surveillance program. It is not intended to diagnose  MRSA infection nor to guide or monitor treatment for MRSA infections.        Radiology Studies: Dg Chest 2 View  Result Date: 12/24/2016 CLINICAL DATA:  Bilateral chest and rib pain for the past 4 days. Recent episode of pneumonia. History of hypertension and CHF. EXAM: CHEST  2 VIEW COMPARISON:  PA and lateral chest x-ray of December 23, 2016 FINDINGS: The lungs are adequately inflated. The interstitial markings are more conspicuous today. There are bilateral pleural effusions layering posteriorly which are stable. The heart is normal in size. The perihilar regions are prominent. There is calcification in the wall of the aortic arch. The bony thorax is unremarkable. IMPRESSION: Persistent increased density in the left lower lobe and to a lesser extent on the right. Persistent bilateral pleural effusions layering posteriorly. Mild perihilar interstitial prominence suggests low-grade CHF though the cardiac silhouette is not significantly enlarged. Thoracic aortic atherosclerosis. Electronically Signed   By: David  Swazil[place]Jordan M.D.   On: 12/24/2016 07:38   Dg Chest 2 View  Result Date: 12/23/2016 CLINICAL DATA:  Hyperglycemia.  Chills. EXAM: CHEST  2 VIEW COMPARISON:  12/02/2016. FINDINGS: Mediastinum and hilar structures are normal. Low  lung volumes. Bibasilar infiltrates. Interim clearing of bilateral pleural effusions. No pneumothorax. Prior cervical spine fusion . IMPRESSION: Bibasilar infiltrates suggesting pneumonia . Interim clearing of bilateral pleural effusions. Electronically Signed   By: Maisie Fushomas  Register   On: 12/23/2016 08:41      Scheduled Meds: . amLODipine  5 mg Oral Daily  . aspirin  81 mg Oral Daily  . atorvastatin  10 mg Oral q1800  . aztreonam  1 g Intravenous Q8H  . carvedilol  3.125 mg Oral BID WC  . clopidogrel  75 mg Oral Daily  . escitalopram  15 mg Oral Daily  . famotidine  20 mg Oral Daily  . guaiFENesin  600 mg Oral BID  . heparin  5,000 Units Subcutaneous Q8H  . insulin aspart  0-6 Units Subcutaneous TID WC  . insulin glargine  4 Units Subcutaneous QHS  . iron polysaccharides  150 mg Oral BID  . levothyroxine  112 mcg Oral QAC breakfast   Continuous Infusions:   LOS: 0 days    Time spent: 40 minutes   Noralee StainJennifer Bekim Werntz, DO [facility] [email or web address] Password [email or web address] 12/24/2016, 1:00 PM

## 2016-12-24 NOTE — Evaluation (Addendum)
Physical Therapy Evaluation Patient Details Name: Cassandra [name] MRN: [id number] DOB: [birth year] Today's Date: 12/24/2016   History of Present Illness  Cassandra [name] is a 71 y.o. female with a history of diabetes, with prior DKA, CKD , hypertension, CHF, COPD, GERD, recent h/o C. Diff 10/2016, hyopthyroidism,  prior history of stroke on aspirin and Plavix, and recent UTI presenting from her nursing facility due to decreased mentation, and increased work of breathing, without productive cough.  Found to have PNA.  Clinical Impression  Pt admitted with/for complications above and found to have PNA.  Pt currently limited functionally due to the problems listed. ( See problems list.)   Pt will benefit from PT to maximize function and safety in order to get ready for next venue listed below.     Follow Up Recommendations SNF    Equipment Recommendations  None recommended by PT    Recommendations for Other Services       Precautions / Restrictions Precautions Precautions: Fall      Mobility  Bed Mobility Overal bed mobility: Needs Assistance Bed Mobility: Rolling;Sidelying to Sit Rolling: Min assist Sidelying to sit: Mod assist       General bed mobility comments: cues for direction and sequencing.  assist to both roll and help come up toward sit and stability during scoot.  Transfers Overall transfer level: Needs assistance Equipment used: 1 person hand held assist Transfers: Sit to/from [employer] Sit to Stand: Mod assist Stand pivot transfers: Min assist;Mod assist       General transfer comment: face to face assist from bed to chair.  Some w/shifting assist.  Ambulation/Gait             General Gait Details: transfer only  Stairs            Wheelchair Mobility    Modified Rankin (Stroke Patients Only)       Balance Overall balance assessment: Needs assistance Sitting-balance support: Bilateral upper extremity supported;Feet  supported Sitting balance-Leahy Scale: Poor Sitting balance - Comments: pt consistently listing posteriorly and to the Right     Standing balance-Leahy Scale: Poor Standing balance comment: needed external support                              Pertinent Vitals/Pain Pain Assessment: Faces Faces Pain Scale: Hurts even more Pain Location: R side Pain Descriptors / Indicators: Sore Pain Intervention(s): Repositioned    Home Living Family/patient expects to be discharged to:: Skilled nursing facility                      Prior Function Level of Independence: Needs assistance   Gait / Transfers Assistance Needed: uses w/c for mobility mostly.  ADL's / Homemaking Assistance Needed: assisted with ADLS  Comments: no family present.  pt's answers are suspect     Hand Dominance   Dominant Hand: Right    Extremity/Trunk Assessment        Lower Extremity Assessment Lower Extremity Assessment: Generalized weakness       Communication   Communication: No difficulties  Cognition Arousal/Alertness: Awake/alert Behavior During Therapy: Flat affect Overall Cognitive Status: History of cognitive impairments - at baseline Area of Impairment: Orientation;Following commands;Problem solving Orientation Level: Place;Time;Situation     Following Commands: Follows one step commands with increased time     Problem Solving: Slow processing;Requires verbal cues General Comments: needs redirecting    General  Comments      Exercises     Assessment/Plan    PT Assessment Patient needs continued PT services  PT Problem List Decreased strength;Decreased activity tolerance;Decreased mobility;Decreased balance;Decreased knowledge of use of DME;Cardiopulmonary status limiting activity          PT Treatment Interventions DME instruction;Gait training;Functional mobility training;Therapeutic activities;Balance training;Patient/family education    PT Goals (Current  goals can be found in the Care Plan section)  Acute Rehab PT Goals Patient Stated Goal: To go home. PT Goal Formulation: With patient Time For Goal Achievement: 12/25/16 Potential to Achieve Goals: Fair    Frequency Min 2X/week   Barriers to discharge        Co-evaluation               End of Session   Activity Tolerance: Patient tolerated treatment well Patient left: in chair;with call bell/phone within reach;with chair alarm set Nurse Communication: Mobility status    Functional Assessment Tool Used: clinical judgement Functional Limitation: Mobility: Walking and moving around Mobility: Walking and Moving Around Current Status ([id number](G8978): At least 1 percent but less than 20 percent impaired, limited or restricted Mobility: Walking and Moving Around Goal Status [phone](G8979): At least 1 percent but less than 20 percent impaired, limited or restricted    Time: 1459-1530 PT Time Calculation (min) (ACUTE ONLY): 31 min   Charges:   PT Evaluation $PT Eval Moderate Complexity: 1 Procedure PT Treatments $Therapeutic Activity: 8-22 mins   PT G Codes:   PT G-Codes **NOT FOR INPATIENT CLASS** Functional Assessment Tool Used: clinical judgement Functional Limitation: Mobility: Walking and moving around Mobility: Walking and Moving Around Current Status ([id number](G8978): At least 1 percent but less than 20 percent impaired, limited or restricted Mobility: Walking and Moving Around Goal Status [phone](G8979): At least 1 percent but less than 20 percent impaired, limited or restricted    Kate SableKenneth V Eryx Zane 12/24/2016, 3:58 PM 12/24/2016  [place] BingKen Kamauri Kathol, PT [phone]  (pager)

## 2016-12-24 NOTE — Progress Notes (Addendum)
Inpatient Diabetes Program Recommendations  AACE/ADA: New Consensus Statement on Inpatient Glycemic Control (2015)  Target Ranges:  Prepandial:   less than 140 mg/dL      Peak postprandial:   less than 180 mg/dL (1-2 hours)      Critically ill patients:  140 - 180 mg/dL   Lab Results  Component Value Date   GLUCAP 262 (H) 12/24/2016   HGBA1C 7.4 (H) 12/23/2016    Review of Glycemic Control  Diabetes history:  Type 1 diabetes Outpatient Diabetes medications: Lantus 8 units qd + Humalog 1-5 units tid meal coverage + Humalog correction scale Current orders for Inpatient glycemic control: Novolog correction 0-9 units   Inpatient Diabetes Program Recommendations:   Patient is type 1. Please consider adding Lantus 4 units daily.  Also consider individual Novolog correction starting at 151 mg/dL.  Consider Novolog 151-200 mg/dL-1 unit, [id number] mg/dL- 2 units, 811-914251-300 mg/dL-3 units, [phone] mg/dL-4 units, 213-086351-400 mg/dL-5 units, >578>401 mg/dL- Give 6 units and call MD.  Text page sent to Dr. Alvino Chapelhoi.  Thank you, Billy FischerJudy E. Nhyla Nappi, RN, MSN, CDE Inpatient Glycemic Control Team Team Pager [phone] (8am-5pm) 12/24/2016 12:39 PM

## 2016-12-25 DIAGNOSIS — E1165 Type 2 diabetes mellitus with hyperglycemia: Secondary | ICD-10-CM

## 2016-12-25 DIAGNOSIS — J189 Pneumonia, unspecified organism: Principal | ICD-10-CM

## 2016-12-25 DIAGNOSIS — I5032 Chronic diastolic (congestive) heart failure: Secondary | ICD-10-CM

## 2016-12-25 DIAGNOSIS — Z794 Long term (current) use of insulin: Secondary | ICD-10-CM

## 2016-12-25 DIAGNOSIS — E162 Hypoglycemia, unspecified: Secondary | ICD-10-CM

## 2016-12-25 DIAGNOSIS — D638 Anemia in other chronic diseases classified elsewhere: Secondary | ICD-10-CM

## 2016-12-25 LAB — CBC WITH DIFFERENTIAL/PLATELET
BASOS ABS: 0 10*3/uL (ref 0.0–0.1)
BASOS PCT: 0 %
EOS ABS: 0.6 10*3/uL (ref 0.0–0.7)
Eosinophils Relative: 8 %
HEMATOCRIT: 24.2 % — AB (ref 36.0–46.0)
Hemoglobin: 8.1 g/dL — ABNORMAL LOW (ref 12.0–15.0)
Lymphocytes Relative: 24 %
Lymphs Abs: 1.8 10*3/uL (ref 0.7–4.0)
MCH: 30.3 pg (ref 26.0–34.0)
MCHC: 33.5 g/dL (ref 30.0–36.0)
MCV: 90.6 fL (ref 78.0–100.0)
MONO ABS: 0.5 10*3/uL (ref 0.1–1.0)
Monocytes Relative: 7 %
Neutro Abs: 4.8 10*3/uL (ref 1.7–7.7)
Neutrophils Relative %: 61 %
PLATELETS: 244 10*3/uL (ref 150–400)
RBC: 2.67 MIL/uL — ABNORMAL LOW (ref 3.87–5.11)
RDW: 14.2 % (ref 11.5–15.5)
WBC: 7.8 10*3/uL (ref 4.0–10.5)

## 2016-12-25 LAB — BASIC METABOLIC PANEL
ANION GAP: 10 (ref 5–15)
BUN: 21 mg/dL — ABNORMAL HIGH (ref 6–20)
CALCIUM: 7.6 mg/dL — AB (ref 8.9–10.3)
CO2: 23 mmol/L (ref 22–32)
CREATININE: 1.31 mg/dL — AB (ref 0.44–1.00)
Chloride: 104 mmol/L (ref 101–111)
GFR, EST AFRICAN AMERICAN: 47 mL/min — AB (ref >60)
GFR, EST NON AFRICAN AMERICAN: 41 mL/min — AB (ref >60)
Glucose, Bld: 101 mg/dL — ABNORMAL HIGH (ref 65–99)
Potassium: 3.6 mmol/L (ref 3.5–5.1)
SODIUM: 137 mmol/L (ref 135–145)

## 2016-12-25 LAB — RESPIRATORY PANEL BY PCR
Adenovirus: NOT DETECTED
BORDETELLA PERTUSSIS-RVPCR: NOT DETECTED
Chlamydophila pneumoniae: NOT DETECTED
Coronavirus 229E: NOT DETECTED
Coronavirus HKU1: NOT DETECTED
Coronavirus NL63: NOT DETECTED
Coronavirus OC43: NOT DETECTED
INFLUENZA A-RVPPCR: NOT DETECTED
INFLUENZA B-RVPPCR: NOT DETECTED
METAPNEUMOVIRUS-RVPPCR: NOT DETECTED
MYCOPLASMA PNEUMONIAE-RVPPCR: NOT DETECTED
PARAINFLUENZA VIRUS 1-RVPPCR: NOT DETECTED
PARAINFLUENZA VIRUS 3-RVPPCR: NOT DETECTED
PARAINFLUENZA VIRUS 4-RVPPCR: NOT DETECTED
Parainfluenza Virus 2: NOT DETECTED
RESPIRATORY SYNCYTIAL VIRUS-RVPPCR: NOT DETECTED
Rhinovirus / Enterovirus: NOT DETECTED

## 2016-12-25 LAB — GLUCOSE, CAPILLARY
GLUCOSE-CAPILLARY: 83 mg/dL (ref 65–99)
GLUCOSE-CAPILLARY: 74 mg/dL (ref 65–99)
GLUCOSE-CAPILLARY: 154 mg/dL — AB (ref 65–99)
Glucose-Capillary: 169 mg/dL — ABNORMAL HIGH (ref 65–99)

## 2016-12-25 LAB — LEGIONELLA PNEUMOPHILA SEROGP 1 UR AG: L. PNEUMOPHILA SEROGP 1 UR AG: NEGATIVE

## 2016-12-25 LAB — CALCIUM, IONIZED: CALCIUM, IONIZED, SERUM: 4.4 mg/dL — AB (ref 4.5–5.6)

## 2016-12-25 LAB — OCCULT BLOOD X 1 CARD TO LAB, STOOL: Fecal Occult Bld: NEGATIVE

## 2016-12-25 LAB — URINE CULTURE

## 2016-12-25 LAB — HAPTOGLOBIN: HAPTOGLOBIN: 349 mg/dL — AB (ref 34–200)

## 2016-12-25 MED ORDER — DIPHENHYDRAMINE HCL 25 MG PO CAPS
25.0000 mg | ORAL_CAPSULE | ORAL | Status: DC
  Administered 2016-12-25 – 2016-12-29 (×5): 25 mg via ORAL
  Filled 2016-12-25 (×5): qty 1

## 2016-12-25 MED ORDER — LEVOFLOXACIN 500 MG PO TABS
500.0000 mg | ORAL_TABLET | ORAL | Status: AC
  Administered 2016-12-25 – 2016-12-27 (×3): 500 mg via ORAL
  Filled 2016-12-25 (×3): qty 1

## 2016-12-25 NOTE — [place] FL2 (Signed)
[place] MEDICAID FL2 LEVEL OF CARE SCREENING TOOL     IDENTIFICATION  Patient Name: Cassandra [name] Birthdate: 1945-12-22 Sex: female Admission Date (Current Location): 12/23/2016  [facility] and [place] Number:  Producer, television/film/video and Address:  [facility] [place], [address] [place], [place] [address]      Provider Number: [id number]  Attending Physician Name and Address:  Maretta Bees, MD  Relative Name and Phone Number:  King,[name] J-Daughter; [phone] (mobile);  Simmons,Keeley-Daughter; [phone] )mobile)     Current Level of Care: Hospital Recommended Level of Care: Skilled Nursing Facility (From [facility]) Prior Approval Number:    Date Approved/Denied:   PASRR Number: [id number] F (Eff. 10/03/16 - 01/01/17)  Discharge Plan: SNF    Current Diagnoses: Patient Active Problem List   Diagnosis Date Noted  . HAP (hospital-acquired pneumonia) 12/23/2016  . HCAP (healthcare-associated pneumonia)   . Diabetes mellitus with complication (HCC)   . Hypoglycemia 12/03/2016  . Palliative care encounter   . Goals of care, counseling/discussion   . Cervical vertebral fracture (HCC) 10/17/2016  . Thoracic vertebral fracture (HCC) 10/17/2016  . Fall at nursing home 10/17/2016  . Common bile duct dilatation 10/17/2016  . Closed fracture of cervical vertebra (HCC)   . Recent Gram-negative bacteremia 10/07/2016  . Gastroesophageal reflux disease   . DKA, type 1 (HCC) 09/06/2016  . Protein-calorie malnutrition, severe 08/28/2016  . Chronic diastolic heart failure (HCC) 07/19/2016  . Dehydration   . Cerebral embolism with cerebral infarction 07/13/2016  . Left ventricular diastolic dysfunction, NYHA class 1 07/11/2016  . History of CVA (cerebrovascular accident)   . HLD (hyperlipidemia)   . Anemia of chronic disease   . Physical deconditioning   . Acute on chronic kidney failure (HCC)   . Diabetic ketoacidosis with coma associated with  type 1 diabetes mellitus (HCC)   . Cerebral thrombosis with cerebral infarction 05/15/2016  . Generalized anxiety disorder 05/10/2016  . Diabetic ketoacidosis without coma associated with type 1 diabetes mellitus (HCC) 05/06/2016  . Insulin dependent type 2 diabetes mellitus, uncontrolled (HCC)   . Depression   . CKD (chronic kidney disease) stage 3, GFR 30-59 ml/min   . Uncontrolled Hypothyroidism   . Vitamin B12 deficiency   . Benign paroxysmal positional vertigo   . Anxiety   . Allergic rhinitis   . Glaucoma   . Benign hypertension with CKD (chronic kidney disease) stage III   . Tobacco abuse   . Cervicalgia   . Elevated liver enzymes   . History of alcohol use     Orientation RESPIRATION BLADDER Height & Weight     Self, Time, Situation, Place  Normal Incontinent Weight: 123 lb 10.9 oz (56.1 kg) Height:  5\' 2"  (157.5 cm)  BEHAVIORAL SYMPTOMS/MOOD NEUROLOGICAL BOWEL NUTRITION STATUS      Incontinent Diet (Heart healthy)  AMBULATORY STATUS COMMUNICATION OF NEEDS Skin   Total Care (Patient did not ambulate with PT on 1/9 - transfers only) Verbally Other (Comment), Skin abrasions (Abrasion right knee and foot; Ecchymosis left foot)                       Personal Care Assistance Level of Assistance    Bathing Assistance: Maximum assistance (Upper body-min and lower body-max assist) Feeding assistance: Limited assistance (assistance with set-up ) Dressing Assistance: Limited assistance     Functional Limitations Info  Sight, Hearing, Speech Sight Info: Adequate Hearing Info: Adequate Speech Info: Adequate  SPECIAL CARE FACTORS FREQUENCY  PT (By licensed PT), OT (By licensed OT)     PT Frequency: Evaluation 1/9 and a minimum of 2X per week recommended OT Frequency: Evaluation 1/10 and a minimum of 2X per week recommended            Contractures Contractures Info: Not present    Additional Factors Info  Code Status, Allergies Code Status Info:  Full Allergies Info: Alprazolam, Percocet, Codeine, Doxycycline, Hydrocodone, Omnicef, Augmentin, Ciprofloxacin           Current Medications (12/25/2016):  This is the current hospital active medication list Current Facility-Administered Medications  Medication Dose Route Frequency Provider Last Rate Last Dose  . acetaminophen (TYLENOL) tablet 650 mg  650 mg Oral Q6H PRN Leda GauzeKaren J Kirby-Graham, NP   650 mg at 12/25/16 1032  . amLODipine (NORVASC) tablet 5 mg  5 mg Oral Daily Marcos EkeSara E Wertman, PA-C   5 mg at 12/25/16 1032  . aspirin chewable tablet 81 mg  81 mg Oral Daily Marcos EkeSara E Wertman, PA-C   81 mg at 12/25/16 [id number]  . atorvastatin (LIPITOR) tablet 10 mg  10 mg Oral q1800 Marcos EkeSara E Wertman, PA-C   10 mg at 12/24/16 1652  . carvedilol (COREG) tablet 3.125 mg  3.125 mg Oral BID WC Marcos EkeSara E Wertman, PA-C   3.125 mg at 12/25/16 [id number]  . clopidogrel (PLAVIX) tablet 75 mg  75 mg Oral Daily Marcos EkeSara E Wertman, PA-C   75 mg at 12/25/16 [id number]  . diphenhydrAMINE (BENADRYL) capsule 25 mg  25 mg Oral Q24H Maretta BeesShanker M Ghimire, MD   25 mg at 12/25/16 1032  . escitalopram (LEXAPRO) tablet 15 mg  15 mg Oral Daily Marcos EkeSara E Wertman, PA-C   15 mg at 12/25/16 [id number]  . famotidine (PEPCID) tablet 20 mg  20 mg Oral Daily Marcos EkeSara E Wertman, PA-C   20 mg at 12/25/16 [id number]  . guaiFENesin (MUCINEX) 12 hr tablet 600 mg  600 mg Oral BID Marcos EkeSara E Wertman, PA-C   600 mg at 12/25/16 [id number]  . heparin injection 5,000 Units  5,000 Units Subcutaneous Q8H Marcos EkeSara E Wertman, PA-C   5,000 Units at 12/25/16 1447  . insulin aspart (novoLOG) injection 0-6 Units  0-6 Units Subcutaneous TID WC Jordan HawksJennifer Chahn-Yang Choi, DO   2 Units at 12/24/16 1653  . insulin glargine (LANTUS) injection 4 Units  4 Units Subcutaneous QHS Jordan HawksJennifer Chahn-Yang Choi, DO   4 Units at 12/24/16 2245  . iron polysaccharides (NIFEREX) capsule 150 mg  150 mg Oral BID Marcos EkeSara E Wertman, PA-C   150 mg at 12/25/16 1032  . levofloxacin (LEVAQUIN) tablet 500 mg  500 mg Oral Q24H Maretta BeesShanker M Ghimire, MD    500 mg at 12/25/16 1032  . levothyroxine (SYNTHROID, LEVOTHROID) tablet 112 mcg  112 mcg Oral QAC breakfast Marcos EkeSara E Wertman, PA-C   112 mcg at 12/25/16 [id number]  . meclizine (ANTIVERT) tablet 25 mg  25 mg Oral TID PRN Marcos EkeSara E Wertman, PA-C   25 mg at 12/24/16 1652  . ondansetron (ZOFRAN-ODT) disintegrating tablet 4 mg  4 mg Oral Q8H PRN Marcos EkeSara E Wertman, PA-C   4 mg at 12/24/16 [id number]     Discharge Medications: Please see discharge summary for a list of discharge medications.  Relevant Imaging Results:  Relevant Lab Results:   Additional Information ss#[id number]  Cristobal GoldmannCrawford, Kenlee Maler Bradley, LCSW

## 2016-12-25 NOTE — Evaluation (Signed)
Occupational Therapy Evaluation Patient Details Name: Cassandra [name] MRN: [id number] DOB: 1946-07-29 Today's Date: 12/25/2016    History of Present Illness Cassandra [name] is a 71 y.o. female with a history of diabetes, with prior DKA, CKD , hypertension, CHF, COPD, GERD, recent h/o C. Diff 10/2016, hyopthyroidism,  prior history of stroke on aspirin and Plavix, and recent UTI presenting from her nursing facility due to decreased mentation, and increased work of breathing, without productive cough.  Found to have PNA.   Clinical Impression   PT admitted with PNA. Pt currently with functional limitiations due to the deficits listed below (see OT problem list). [facility] and used w/c per chart. Pt requires (A) with adls at baseline. Pt with cognitive deficits and unable to provide location of SNF. Pt will benefit from skilled OT to increase their independence and safety with adls and balance to allow discharge SNF. OT to follow acutely for incr activity tolerance.      Follow Up Recommendations  SNF    Equipment Recommendations  Hospital bed;Wheelchair cushion (measurements OT);Wheelchair (measurements OT);3 in 1 bedside commode    Recommendations for Other Services       Precautions / Restrictions Precautions Precautions: Fall Restrictions Weight Bearing Restrictions: No      Mobility Bed Mobility Overal bed mobility: Needs Assistance Bed Mobility: Rolling;Supine to Sit Rolling: Mod assist Sidelying to sit: Mod assist       General bed mobility comments: cues to sequence and (A) to elevate trunk off bed surface  Transfers Overall transfer level: Needs assistance Equipment used: 1 person hand held assist Transfers: Sit to/from Stand           General transfer comment: pt able to complete sit <>stand with mod cues    Balance Overall balance assessment: Needs assistance Sitting-balance support: Bilateral upper extremity supported;Feet  supported Sitting balance-Leahy Scale: Poor     Standing balance support: Bilateral upper extremity supported;During functional activity Standing balance-Leahy Scale: Poor                              ADL Overall ADL's : Needs assistance/impaired Eating/Feeding: Set up;Sitting   Grooming: Wash/dry hands;Wash/dry face;Minimal assistance   Upper Body Bathing: Minimal assistance   Lower Body Bathing: Maximal assistance           Toilet Transfer: Minimal assistance             General ADL Comments: pt completed bed to chair transfer with stedy. incontinent of bladder on arrival      Vision     Perception     Praxis      Pertinent Vitals/Pain Pain Assessment: 0-10 Pain Score: 8  Pain Location: frontal headache  Pain Descriptors / Indicators: Headache Pain Intervention(s): Monitored during session;RN gave pain meds during session;Repositioned     Hand Dominance Right   Extremity/Trunk Assessment Upper Extremity Assessment Upper Extremity Assessment: Generalized weakness   Lower Extremity Assessment Lower Extremity Assessment: Generalized weakness   Cervical / Trunk Assessment Cervical / Trunk Assessment: Other exceptions (hx of cervical injury 10/17/2016)   Communication Communication Communication: No difficulties   Cognition Arousal/Alertness: Awake/alert Behavior During Therapy: Flat affect Overall Cognitive Status: History of cognitive impairments - at baseline                 General Comments: pt incontinent and unaware on arrival.   General Comments  Exercises       Shoulder Instructions      Home Living Family/patient expects to be discharged to:: Skilled nursing facility                                        Prior Functioning/Environment Level of Independence: Needs assistance  Gait / Transfers Assistance Needed: uses w/c for mobility mostly. ADL's / Homemaking Assistance Needed: assisted  with ADLS   Comments: no family present.  pt's answers are suspect        OT Problem List: Decreased strength;Decreased activity tolerance;Impaired balance (sitting and/or standing);Decreased safety awareness;Decreased knowledge of use of DME or AE;Decreased knowledge of precautions;Pain   OT Treatment/Interventions: Self-care/ADL training;Therapeutic exercise;DME and/or AE instruction;Therapeutic activities;Patient/family education;Balance training    OT Goals(Current goals can be found in the care plan section) Acute Rehab OT Goals Patient Stated Goal: none stated OT Goal Formulation: Patient unable to participate in goal setting Time For Goal Achievement: 01/08/17 Potential to Achieve Goals: Good  OT Frequency: Min 2X/week   Barriers to D/C:            Co-evaluation              End of Session Equipment Utilized During Treatment: Gait belt Nurse Communication: Mobility status;Precautions  Activity Tolerance: Patient tolerated treatment well Patient left: in chair;with call bell/phone within reach;with nursing/sitter in room   Time: 1013-1034 OT Time Calculation (min): 21 min Charges:  OT General Charges $OT Visit: 1 Procedure OT Evaluation $OT Eval Moderate Complexity: 1 Procedure G-Codes:    Cassandra [name] 01-21-17, 11:01 AM    Cassandra [name]   OTR/L Pager: [phone] Office: [phone] .

## 2016-12-25 NOTE — Progress Notes (Signed)
PROGRESS NOTE        PATIENT DETAILS Name: Cassandra [name] Age: 71 y.o. Sex: female Date of Birth: 19-Sep-1946 Admit Date: 12/23/2016 Admitting Physician Ozella Rocks, MD ZOX:[id number] Cassandra Coaster, MD  Brief Narrative: Patient is a 71 y.o. female insulin-dependent type 2 diabetes, stroke underwent antiplatelet agents, chronic kidney disease, hypertension, chronic diastolic heart failure presented to the hospital with acute encephalopathy-found to have possible HCAP-and hypoglycemia. Subsequently admitted for further evaluation and treatment.  Subjective: Awake and alert-lying comfortably. No major issues per RN.  Assessment/Plan: Acute encephalopathy: Resolved. Probably due to a c ombination of hypoglycemia and HCAP.   Hypoglycemia: CBGs stable-with Lantus decreased to 4 units and sliding scale insulin.  HCAP: Improving, afebrile with no leukocytosis. Respiratory virus panel is pending. Blood cultures negative. De-escalate antibiotics to levofloxacin-we will plan a total of 5 days of antimicrobial therapy.  Asymptomatic bacteriuria: Doubt UTI-she has no symptoms, further UA is unremarkable.  Acute on chronic normocytic Anemia: Has a history of chronic normocytic anemia-suspect drop in hemoglobin is likely secondary to acute illness-no evidence of any overt blood loss. Continue to monitor CBC periodically.  Type 1 diabetes: A1c 7.4-hypoglycemic on initial presentation-Lantus decreased to 4 units along with SSI-she appears currently stable.  Hypertension: Controlled, continue Norvasc and Coreg  Chronic diastolic heart failure: Clinically compensated.  History of CVA: Continue  aspirin, Plavix and statin-no gross focal deficits on exam.   Hypothyroidism: TSH elevated at 39-on 12/19 TSH was 180-continue current dosing of levothyroxine, recheck TSH in 3 months  Anxiety:Continue preadmission Lexapro   GERD: Continue Pepcid.  Physical  deconditioning/generalized weakness: PT eval completed-back to SNF on discharge  DVT Prophylaxis: Prophylactic Heparin   Code Status: Full code   Family Communication: None at bedside  Disposition Plan: Remain inpatient- SNF on discharge-likely 1/11  Antimicrobial agents: Anti-infectives    Start     Dose/Rate Route Frequency Ordered Stop   12/25/16 1100  levofloxacin (LEVAQUIN) tablet 500 mg     500 mg Oral Every 24 hours 12/25/16 0940     12/24/16 1130  vancomycin (VANCOCIN) IVPB 1000 mg/200 mL premix  Status:  Discontinued     1,000 mg 200 mL/hr over 60 Minutes Intravenous Every 24 hours 12/23/16 1142 12/24/16 1254   12/24/16 1030  vancomycin (VANCOCIN) IVPB 750 mg/150 ml premix  Status:  Discontinued     750 mg 150 mL/hr over 60 Minutes Intravenous Every 24 hours 12/23/16 1020 12/23/16 1138   12/24/16 1030  vancomycin (VANCOCIN) IVPB 1000 mg/200 mL premix  Status:  Discontinued     1,000 mg 200 mL/hr over 60 Minutes Intravenous Every 24 hours 12/23/16 1138 12/23/16 1142   12/24/16 0600  aztreonam (AZACTAM) 2 g in dextrose 5 % 50 mL IVPB  Status:  Discontinued     2 g 100 mL/hr over 30 Minutes Intravenous Every 8 hours 12/23/16 1138 12/23/16 1523   12/23/16 1530  aztreonam (AZACTAM) 1 g in dextrose 5 % 50 mL IVPB  Status:  Discontinued     1 g 100 mL/hr over 30 Minutes Intravenous Every 8 hours 12/23/16 1523 12/25/16 0940   12/23/16 1400  aztreonam (AZACTAM) 2 g in dextrose 5 % 50 mL IVPB  Status:  Discontinued     2 g 100 mL/hr over 30 Minutes Intravenous Every 8 hours 12/23/16 1135 12/23/16 1138   12/23/16 1030  ceFEPIme (MAXIPIME) 1 g in dextrose 5 % 50 mL IVPB  Status:  Discontinued     1 g 100 mL/hr over 30 Minutes Intravenous  Once 12/23/16 1014 12/23/16 1138   12/23/16 1030  vancomycin (VANCOCIN) IVPB 1000 mg/200 mL premix     1,000 mg 200 mL/hr over 60 Minutes Intravenous  Once 12/23/16 1020 12/23/16 1358      Procedures: None  CONSULTS:  None  Time  spent: 25- minutes-Greater than 50% of this time was spent in counseling, explanation of diagnosis, planning of further management, and coordination of care.  MEDICATIONS: Scheduled Meds: . amLODipine  5 mg Oral Daily  . aspirin  81 mg Oral Daily  . atorvastatin  10 mg Oral q1800  . carvedilol  3.125 mg Oral BID WC  . clopidogrel  75 mg Oral Daily  . diphenhydrAMINE  25 mg Oral Q24H  . escitalopram  15 mg Oral Daily  . famotidine  20 mg Oral Daily  . guaiFENesin  600 mg Oral BID  . heparin  5,000 Units Subcutaneous Q8H  . insulin aspart  0-6 Units Subcutaneous TID WC  . insulin glargine  4 Units Subcutaneous QHS  . iron polysaccharides  150 mg Oral BID  . levofloxacin  500 mg Oral Q24H  . levothyroxine  112 mcg Oral QAC breakfast   Continuous Infusions: PRN Meds:.acetaminophen, meclizine, ondansetron   PHYSICAL EXAM: Vital signs: Vitals:   12/24/16 1637 12/24/16 2021 12/25/16 0559 12/25/16 0922  BP: 139/60 (!) 130/50 (!) 169/75 (!) 166/68  Pulse: 87 72 79 78  Resp:  14 16 14   Temp: 98.7 F (37.1 C) 98.1 F (36.7 C) 98.2 F (36.8 C) 99.8 F (37.7 C)  TempSrc:    Oral  SpO2:  99% 94% 95%  Weight:  56.1 kg (123 lb 10.9 oz)    Height:       Filed Weights   12/23/16 0659 12/24/16 2021  Weight: 56.2 kg (124 lb) 56.1 kg (123 lb 10.9 oz)   Body mass index is 22.62 kg/m.   General appearance :Awake, alert, not in any distress.  Eyes:, pupils equally reactive to light and accomodation,no scleral icterus. HEENT: Atraumatic and Normocephalic Neck: supple, no JVD. No cervical lymphadenopathy. Resp:Good air entry bilaterally, no added sounds  CVS: S1 S2 regular, no murmurs.  GI: Bowel sounds present, Non tender and not distended with no gaurding, rigidity or rebound.No organomegaly Extremities: B/L Lower Ext shows no edema Neurology:  speech clear,Non focal, sensation is grossly intact. Musculoskeletal:No digital cyanosis Skin:No Rash, warm and dry Wounds:N/A  I have  personally reviewed following labs and imaging studies  LABORATORY DATA: CBC:  Recent Labs Lab 12/23/16 0914 12/24/16 0115 12/24/16 1813 12/25/16 0602  WBC 10.6* 7.8 6.3 7.8  NEUTROABS 9.0*  --  4.4 4.8  HGB 9.8* 7.3* 8.2* 8.1*  HCT 29.7* 21.5* 24.7* 24.2*  MCV 90.0 90.0 90.1 90.6  PLT 333 233 257 244    Basic Metabolic Panel:  Recent Labs Lab 12/23/16 0914 12/24/16 0115 12/24/16 1813 12/25/16 0602  NA 141 137 137 137  K 3.9 4.0 3.9 3.6  CL 106 106 106 104  CO2 27 23 25 23   GLUCOSE 99 129* 219* 101*  BUN 20 17 20  21*  CREATININE 1.23* 1.25* 1.38* 1.31*  CALCIUM 7.9* 7.2* 7.5* 7.6*  MG  --   --  2.2  --   PHOS  --   --  4.1  --     GFR: Estimated  Creatinine Clearance: 32.1 mL/min (by C-G formula based on SCr of 1.31 mg/dL (H)).  Liver Function Tests:  Recent Labs Lab 12/23/16 0914 12/24/16 0115  AST 31 23  ALT 21 16  ALKPHOS 112 91  BILITOT 0.5 0.4  PROT 5.1* 3.9*  ALBUMIN 1.8* 1.5*    Recent Labs Lab 12/23/16 0914  LIPASE 23    Recent Labs Lab 12/23/16 1753  AMMONIA 24    Coagulation Profile: No results for input(s): INR, PROTIME in the last 168 hours.  Cardiac Enzymes: No results for input(s): CKTOTAL, CKMB, CKMBINDEX, TROPONINI in the last 168 hours.  BNP (last 3 results) No results for input(s): PROBNP in the last 8760 hours.  HbA1C:  Recent Labs  12/23/16 1753  HGBA1C 7.4*    CBG:  Recent Labs Lab 12/24/16 0829 12/24/16 1256 12/24/16 1632 12/24/16 2152 12/25/16 0754  GLUCAP 262* 299* 239* 196* 83    Lipid Profile: No results for input(s): CHOL, HDL, LDLCALC, TRIG, CHOLHDL, LDLDIRECT in the last 72 hours.  Thyroid Function Tests:  Recent Labs  12/23/16 1753  TSH 39.016*  FREET4 0.77    Anemia Panel: No results for input(s): VITAMINB12, FOLATE, FERRITIN, TIBC, IRON, RETICCTPCT in the last 72 hours.  Urine analysis:    Component Value Date/Time   COLORURINE YELLOW 12/23/2016 0807   APPEARANCEUR CLEAR  12/23/2016 0807   APPEARANCEUR Clear 06/15/2014 1508   LABSPEC 1.010 12/23/2016 0807   LABSPEC 1.010 06/15/2014 1508   PHURINE 7.0 12/23/2016 0807   GLUCOSEU 50 (A) 12/23/2016 0807   GLUCOSEU Negative 06/15/2014 1508   HGBUR NEGATIVE 12/23/2016 0807   BILIRUBINUR NEGATIVE 12/23/2016 0807   BILIRUBINUR Negative 06/15/2014 1508   KETONESUR NEGATIVE 12/23/2016 0807   PROTEINUR >=300 (A) 12/23/2016 0807   UROBILINOGEN 0.2 09/14/2015 1617   NITRITE NEGATIVE 12/23/2016 0807   LEUKOCYTESUR NEGATIVE 12/23/2016 0807   LEUKOCYTESUR Negative 06/15/2014 1508    Sepsis Labs: Lactic Acid, Venous    Component Value Date/Time   LATICACIDVEN 1.4 12/24/2016 0115    MICROBIOLOGY: Recent Results (from the past 240 hour(s))  Urine culture     Status: Abnormal   Collection Time: 12/23/16  8:07 AM  Result Value Ref Range Status   Specimen Description URINE, RANDOM  Final   Special Requests NONE  Final   Culture >=100,000 COLONIES/mL ENTEROBACTER AEROGENES (A)  Final   Report Status 12/25/2016 FINAL  Final   Organism ID, Bacteria ENTEROBACTER AEROGENES (A)  Final      Susceptibility   Enterobacter aerogenes - MIC*    CEFAZOLIN >=64 RESISTANT Resistant     CEFTRIAXONE 16 INTERMEDIATE Intermediate     CIPROFLOXACIN <=0.25 SENSITIVE Sensitive     GENTAMICIN <=1 SENSITIVE Sensitive     IMIPENEM 1 SENSITIVE Sensitive     NITROFURANTOIN 64 INTERMEDIATE Intermediate     TRIMETH/SULFA <=20 SENSITIVE Sensitive     PIP/TAZO 32 INTERMEDIATE Intermediate     * >=100,000 COLONIES/mL ENTEROBACTER AEROGENES  Blood culture (routine x 2)     Status: None (Preliminary result)   Collection Time: 12/23/16 10:30 AM  Result Value Ref Range Status   Specimen Description BLOOD RIGHT ANTECUBITAL  Final   Special Requests BOTTLES DRAWN AEROBIC ONLY 5CC  Final   Culture NO GROWTH 1 DAY  Final   Report Status PENDING  Incomplete  Blood culture (routine x 2)     Status: None (Preliminary result)   Collection Time:  12/23/16 10:38 AM  Result Value Ref Range Status   Specimen  Description BLOOD RIGHT FOREARM  Final   Special Requests BOTTLES DRAWN AEROBIC ONLY 5CC  Final   Culture NO GROWTH 1 DAY  Final   Report Status PENDING  Incomplete  MRSA PCR Screening     Status: None   Collection Time: 12/23/16  8:26 PM  Result Value Ref Range Status   MRSA by PCR NEGATIVE NEGATIVE Final    Comment:        The GeneXpert MRSA Assay (FDA approved for NASAL specimens only), is one component of a comprehensive MRSA colonization surveillance program. It is not intended to diagnose MRSA infection nor to guide or monitor treatment for MRSA infections.     RADIOLOGY STUDIES/RESULTS: Dg Chest 2 View  Result Date: 12/24/2016 CLINICAL DATA:  Bilateral chest and rib pain for the past 4 days. Recent episode of pneumonia. History of hypertension and CHF. EXAM: CHEST  2 VIEW COMPARISON:  PA and lateral chest x-ray of December 23, 2016 FINDINGS: The lungs are adequately inflated. The interstitial markings are more conspicuous today. There are bilateral pleural effusions layering posteriorly which are stable. The heart is normal in size. The perihilar regions are prominent. There is calcification in the wall of the aortic arch. The bony thorax is unremarkable. IMPRESSION: Persistent increased density in the left lower lobe and to a lesser extent on the right. Persistent bilateral pleural effusions layering posteriorly. Mild perihilar interstitial prominence suggests low-grade CHF though the cardiac silhouette is not significantly enlarged. Thoracic aortic atherosclerosis. Electronically Signed   By: David  [place] M.D.   On: 12/24/2016 07:38   Dg Chest 2 View  Result Date: 12/23/2016 CLINICAL DATA:  Hyperglycemia.  Chills. EXAM: CHEST  2 VIEW COMPARISON:  12/02/2016. FINDINGS: Mediastinum and hilar structures are normal. Low lung volumes. Bibasilar infiltrates. Interim clearing of bilateral pleural effusions. No pneumothorax.  Prior cervical spine fusion . IMPRESSION: Bibasilar infiltrates suggesting pneumonia . Interim clearing of bilateral pleural effusions. Electronically Signed   By: Maisie Fus  Register   On: 12/23/2016 08:41   Ct Head Wo Contrast  Result Date: 12/11/2016 CLINICAL DATA:  Worsening neck pain. History of cervical spine fracture. EXAM: CT HEAD WITHOUT CONTRAST CT CERVICAL SPINE WITHOUT CONTRAST TECHNIQUE: Multidetector CT imaging of the head and cervical spine was performed following the standard protocol without intravenous contrast. Multiplanar CT image reconstructions of the cervical spine were also generated. COMPARISON:  CT scan of October 26, 2016. FINDINGS: CT HEAD FINDINGS Brain: Mild chronic ischemic white matter disease is noted. 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 No mass effect or midline shift is noted. Ventricular size is within normal limits. There is no evidence of mass lesion, hemorrhage or acute infarction. Vascular: Atherosclerosis of carotid siphons is noted. Skull: Normal. Negative for fracture or focal lesion. Sinuses/Orbits: No acute finding. Other: None. CT CERVICAL SPINE FINDINGS Alignment: Minimal grade 1 anterolisthesis of C3-4 and C4-5 is noted secondary to posterior facet joint hypertrophy. Status post surgical anterior fusion of C5-6 with interbody fusion. Skull base and vertebrae: Stable fracture line is seen involving anterior osteophyte of C6-7. Persistent fracture line is seen extending through the anterior aspect of C7 vertebral body compared to prior exam which demonstrates some healing. Stable vertical fracture line seen involving the left side of anterior aspect of T1 vertebral body and osteophytes. Stable fracture involving base of osteophyte anteriorly of T2. Soft tissues and spinal canal: No prevertebral fluid or swelling. No visible canal hematoma. Disc levels: Moderate degenerative disc disease is noted at C6-7 and C7-T1.  Upper chest: Negative. Other:  Hypertrophy of posterior facet joints is noted bilaterally secondary to degenerative change. IMPRESSION: Mild chronic ischemic white matter disease. No acute intracranial abnormality seen. Postsurgical and degenerative changes are seen involving the cervical spine as described on prior exam. Grossly stable appearance of fractures involving anterior osteophytes of C6, C7, T1 and T2 compared to prior exam. Persistent fracture line is seen involving the anterior portion of C7 vertebral body, but does demonstrate some healing compared to prior exam. Stable appearance of vertical fracture line is seen involving left side of anterior aspect of T1 vertebral body. No new fracture is noted in the cervical spine. Electronically Signed   By: Lupita Raider, M.D.   On: 12/11/2016 21:56   Ct Cervical Spine Wo Contrast  Result Date: 12/11/2016 CLINICAL DATA:  Worsening neck pain. History of cervical spine fracture. EXAM: CT HEAD WITHOUT CONTRAST CT CERVICAL SPINE WITHOUT CONTRAST TECHNIQUE: Multidetector CT imaging of the head and cervical spine was performed following the standard protocol without intravenous contrast. Multiplanar CT image reconstructions of the cervical spine were also generated. COMPARISON:  CT scan of October 26, 2016. FINDINGS: CT HEAD FINDINGS Brain: Mild chronic ischemic white matter disease is noted. 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 No mass effect or midline shift is noted. Ventricular size is within normal limits. There is no evidence of mass lesion, hemorrhage or acute infarction. Vascular: Atherosclerosis of carotid siphons is noted. Skull: Normal. Negative for fracture or focal lesion. Sinuses/Orbits: No acute finding. Other: None. CT CERVICAL SPINE FINDINGS Alignment: Minimal grade 1 anterolisthesis of C3-4 and C4-5 is noted secondary to posterior facet joint hypertrophy. Status post surgical anterior fusion of C5-6 with interbody fusion. Skull base and vertebrae: Stable  fracture line is seen involving anterior osteophyte of C6-7. Persistent fracture line is seen extending through the anterior aspect of C7 vertebral body compared to prior exam which demonstrates some healing. Stable vertical fracture line seen involving the left side of anterior aspect of T1 vertebral body and osteophytes. Stable fracture involving base of osteophyte anteriorly of T2. Soft tissues and spinal canal: No prevertebral fluid or swelling. No visible canal hematoma. Disc levels: Moderate degenerative disc disease is noted at C6-7 and C7-T1. Upper chest: Negative. Other: Hypertrophy of posterior facet joints is noted bilaterally secondary to degenerative change. IMPRESSION: Mild chronic ischemic white matter disease. No acute intracranial abnormality seen. Postsurgical and degenerative changes are seen involving the cervical spine as described on prior exam. Grossly stable appearance of fractures involving anterior osteophytes of C6, C7, T1 and T2 compared to prior exam. Persistent fracture line is seen involving the anterior portion of C7 vertebral body, but does demonstrate some healing compared to prior exam. Stable appearance of vertical fracture line is seen involving left side of anterior aspect of T1 vertebral body. No new fracture is noted in the cervical spine. Electronically Signed   By: Lupita Raider, M.D.   On: 12/11/2016 21:56   Dg Chest Portable 1 View  Result Date: 12/02/2016 CLINICAL DATA:  Nausea for 2 days.  Known cervical fracture. EXAM: PORTABLE CHEST 1 VIEW COMPARISON:  Chest radiograph 10/16/2016 FINDINGS: Cervical collar obscures evaluation of the upper chest. Cardiomediastinal contours are within normal limits. No focal airspace consolidation or pulmonary edema. Suspect small bilateral pleural effusions. No pneumothorax. IMPRESSION: Suspected small bilateral pleural effusions. No focal airspace disease. Electronically Signed   By: Deatra Robinson M.D.   On: 12/02/2016 20:58      LOS:  1 day   Jeoffrey Massed, MD  [facility] Pager:[phone]  If 7PM-7AM, please contact night-coverage [email or web address] Password [email or web address] 12/25/2016, 11:01 AM

## 2016-12-25 NOTE — Care Management Important Message (Signed)
Important Message  Patient Details  Name: Cassandra [name] MRN: [id number] Date of Birth: 23-Aug-1946   Medicare Important Message Given:  Yes    Thena Devora, Annamarie Majorheryl U, RN 12/25/2016, 12:12 PM

## 2016-12-25 NOTE — Clinical Social Work Note (Signed)
CSW talked with patient at the bedside and [facility] does [facility]. Full assessment to follow. FL-2 completed.  Genelle BalVanessa Sharisse Rantz, MSW, LCSW Licensed Clinical Social Worker Clinical Social Work Department [facility] [phone]

## 2016-12-26 DIAGNOSIS — E118 Type 2 diabetes mellitus with unspecified complications: Secondary | ICD-10-CM

## 2016-12-26 DIAGNOSIS — E039 Hypothyroidism, unspecified: Secondary | ICD-10-CM

## 2016-12-26 LAB — GLUCOSE, CAPILLARY
GLUCOSE-CAPILLARY: 126 mg/dL — AB (ref 65–99)
GLUCOSE-CAPILLARY: 240 mg/dL — AB (ref 65–99)
Glucose-Capillary: 246 mg/dL — ABNORMAL HIGH (ref 65–99)
Glucose-Capillary: 126 mg/dL — ABNORMAL HIGH (ref 65–99)
Glucose-Capillary: 153 mg/dL — ABNORMAL HIGH (ref 65–99)

## 2016-12-26 MED ORDER — INSULIN GLARGINE 100 UNIT/ML ~~LOC~~ SOLN: 4.0000 [IU] | Freq: Every day | SUBCUTANEOUS | 11 refills | Status: DC

## 2016-12-26 MED ORDER — INSULIN LISPRO 100 UNIT/ML ~~LOC~~ SOLN: 1.0000 [IU] | Freq: Three times a day (TID) | SUBCUTANEOUS | Status: DC

## 2016-12-26 MED ORDER — LEVOFLOXACIN 500 MG PO TABS: 500.0000 mg | ORAL_TABLET | ORAL | Status: DC

## 2016-12-26 MED ORDER — GUAIFENESIN ER 600 MG PO TB12: 600.0000 mg | ORAL_TABLET | Freq: Two times a day (BID) | ORAL | Status: DC | PRN

## 2016-12-26 NOTE — Clinical Social Work Note (Signed)
Clinical Social Work Assessment  Patient Details  Name: Cassandra [name] MRN: [id number] Date of Birth: September 26, 1946  Date of referral:  12/23/16               Reason for consult:  Facility Placement                Permission sought to share information with:  Family Supports Permission granted to share information::  Yes - On 1/10, patient gave CSW permission to contact daughter Joni Reiningicole regarding her discharge.   Name::     [name]  Agency::     Relationship::  Daughter  Contact Information:  [phone]  Housing/Transportation Living arrangements for the past 2 months:  Skilled Nursing Facility [facility] Source of Information:  Adult Children (Daughter Joni ReiningNicole) Patient Interpreter Needed:  None Criminal Activity/Legal Involvement Pertinent to Current Situation/Hospitalization:  No - Comment as needed Significant Relationships:  Adult Children Lives with:  Facility Resident ([facility]) Do you feel safe going back to the place where you live?  No Need for family participation in patient care:  Yes (Comment)  Care giving concerns:  Talked to patient on 1/10 and Mrs. Kovacik indicated that she would be returning [facility]. Daughter also in agreement.  Social Worker assessment / plan:  CSW talked with patient on 1/10 regarding discharge disposition and confirmed intent to return to [facility] at discharge. Mrs. Gittleman also reported that her daughter said something to her about coming to live with her.  CSW talked with patient's daughter [name] on 1/11 and daughter also indicated that patient will [facility]. CSW informed by daughter that prior to going to rehab, her mother lived with her dad and during the transition from the last hospital stay to going to [facility], her dad filed separation papers from her mom and continues to live in the home.   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  Patient/Family's Understanding of and Emotional Response to Diagnosis, Current Treatment, and Prognosis:  Not discussed.  Emotional Assessment Appearance:  Appears stated age Attitude/Demeanor/Rapport:   (Appropriate) Affect (typically observed):  Appropriate Orientation:  Oriented to Self, Oriented to Place, Oriented to  Time, Oriented to Situation Alcohol / Substance use:  Tobacco Use, Alcohol Use, Illicit Drugs (Patient reported that she quit smoking in 1925, and does not consume alcohol or use illicit drugs) Psych involvement (Current and /or in the community):  No (Comment)  Discharge Needs  Concerns to be addressed:  Discharge Planning Concerns Readmission within the last 30 days:  Yes Current discharge risk:  None Barriers to Discharge:  No Barriers Identified   Cristobal GoldmannCrawford, Ajeenah Heiny Bradley, LCSW 12/26/2016, 6:01 PM

## 2016-12-26 NOTE — Clinical Social Work Note (Signed)
Patient medically stable for discharge today and initial plan was to [facility]. [facility] regarding patient's return and was informed that patient will not be able to return as owes the facility approx. $15,000. CSW talked with daughter, Ms. Brooke DareKing regarding [facility] and she had been trying to reach someone and expressed to CSW that her mom would be [facility].   CSW talked with daughter again late afternoon and was advised that she will be coming to get her mom this evening and taking patient to her home. Daughter not sure what the long-term plan for her mom will be in terms of living arrangements and this was discussed. She has applied for Medicaid, but still has to provide all of the verifications needed for the application to be processed. Ms. Brooke DareKing requested prescriptions be called into [facility] [address] [place], patient will need diabetic supplies (prescriptions or information on what supplied can be purchased OTC).  This information relayed to nurse.  CSW informed at approx. 6:15 pm by nurse that patient will discharge home tomorrow per MD, after he was contacted regarding change in discharge plan.  Genelle BalVanessa Altie Savard, MSW, LCSW Licensed Clinical Social Worker Clinical Social Work Department [facility] [phone]

## 2016-12-26 NOTE — Progress Notes (Signed)
PT Cancellation Note  Patient Details Name: Cassandra AbbeBillie A Ang MRN: [id number] DOB: [birth year]   Cancelled Treatment:    Reason Eval/Treat Not Completed: Patient declined, no reason specified.  Pt stated she doesn't feel well enough to do any therapy. 12/26/2016  [place] BingKen Jazzmen Restivo, PT [phone]  (pager)   Eliseo GumKenneth V Thersia Petraglia 12/26/2016, 3:41 PM

## 2016-12-26 NOTE — Discharge Summary (Addendum)
PATIENT DETAILS Name: Cassandra [name] Age: 71 y.o. Sex: female Date of Birth: [birth year] MRN: [id number]. Admitting Physician: Waldemar Dickens, MD BOF:[id number],NIDPOE KELLER, MD  Admit Date: 12/23/2016 Discharge date: 12/29/2016  Recommendations for Outpatient Follow-up:  1. Follow up with PCP in 1-2 weeks 2. Please obtain BMP/CBC in one week 3. Seems to have very brittle diabetes, avoid strict hypoglycemic control-would allow some amount of mild permissive hyperglycemia. 4. Recheck TSH in 3 months  Admitted From:  SNF  Disposition: SNF  Addendum 1/12: Initially scheduled to be discharged to SNF-but due to financial issues, being discharged home with maximal home health services.   Home Health: Yes-home health PT, RN, social worker, Aide  Equipment/Devices: None  Discharge Condition: Stable  CODE STATUS: FULL CODE  Diet recommendation:  Heart Healthy / Carb Modified  Brief Summary: See H&P, Labs, Consult and Test reports for all details in brief,Patient is a 71 y.o. female insulin-dependent type 2 diabetes, stroke underwent antiplatelet agents, chronic kidney disease, hypertension, chronic diastolic heart failure presented to the hospital with acute encephalopathy-found to have possible HCAP-and hypoglycemia. Subsequently admitted for further evaluation and treatment.  Brief Hospital Course: Acute encephalopathy: Resolved. Probably due to a c ombination of hypoglycemia and HCAP.   Hypoglycemia: CBGs stable-with Lantus decreased to 4 units and sliding scale insulin.  HCAP: Improving, afebrile with no leukocytosis. Respiratory virus panel negative. Blood cultures negative. De-escalate antibiotics to levofloxacin-we will plan a total of 5 days of antimicrobial therapy-stop date of 1/12. Note, although has a apparent allergy to ciprofloxacin-no allergic reactions observed with levofloxacin.  Asymptomatic bacteriuria: Doubt UTI-she has no symptoms, further UA is  unremarkable.  Acute on chronic normocytic Anemia: Has a history of chronic normocytic anemia-suspect drop in hemoglobin is likely secondary to acute illness-no evidence of any overt blood loss. FOBT stools negative. Continue to monitor CBC periodically-suggest repeat CBC in 1 week.  Type 1 diabetes: A1c 7.4-hypoglycemic on initial presentation-Lantus decreased to 4 units along with CBGs currently stable. Patient appears to have brittle diabetes, avoid strict hypoglycemic control-would allow some amount of permissive mild hyperglycemia. Family reports that patient occasionally refuses to eat-and subsequently dropped her sugars when she is giving coverage scale sliding scale. Suggest that if patient refuses to eat her meal-she not be covered with sliding scale insulin.  Hypertension: Controlled, continue Norvasc and Coreg  Chronic diastolic heart failure: Clinically compensated.  Chronic kidney disease stage III: Creatinine close to usual baseline follow in the outpatient setting.  History of CVA: Continue  aspirin, Plavix and statin-no gross focal deficits on exam.   Hypothyroidism: TSH elevated at 39-on 12/19 TSH was 180-continue current dosing of levothyroxine, recheck TSH in 3 months  Anxiety:Continue preadmission Lexapro   GERD: Continue Pepcid.  Physical deconditioning/generalized weakness: PT eval completed-back to SNF on discharge  Note-spoke with daughter on 1/11.  Addendum 1/14 Plans were to eventually discharge patient on 1/13-but patient developed vomiting. The patient was monitored closely overnight. She no longer has vomiting, and is back to her baseline. Per my discussion with the patient and with the daughter-patient does have these episodes of vomiting once or twice a week-suspect she may have underlying gastroparesis from long-standing diabetes. I have encouraged outpatient follow-up with PCP, since these episodes of a transient-we'll continue using as needed  antiemetics. Given the fact that she is frail and elderly-would want to avoid using Reglan at this time.   Procedures/Studies: None  Discharge Diagnoses:  Active Problems:   Insulin dependent type 2 diabetes  mellitus, uncontrolled ([place])   Depression   CKD (chronic kidney disease) stage 3, GFR 30-59 ml/min   Uncontrolled Hypothyroidism   Vitamin B12 deficiency   Benign paroxysmal positional vertigo   Anxiety   Glaucoma   Tobacco abuse   History of alcohol use   History of CVA (cerebrovascular accident)   HLD (hyperlipidemia)   Anemia of chronic disease   Physical deconditioning   Chronic diastolic heart failure (HCC)   Gastroesophageal reflux disease   Hypoglycemia   HAP (hospital-acquired pneumonia)   HCAP (healthcare-associated pneumonia)   Diabetes mellitus with complication [facility])   Discharge Instructions:  Activity:  As tolerated with Full fall precautions use walker/cane & assistance as needed   Discharge Instructions    Call MD for:  difficulty breathing, headache or visual disturbances    Complete by:  As directed    Diet - low sodium heart healthy    Complete by:  As directed    Diet Carb Modified    Complete by:  As directed    Discharge instructions    Complete by:  As directed    Follow with Primary MD  Cammy Copa, MD  in 1 week   Patient has brittle diabetes-please check CBGs very closely. If patient not planning on consuming her meal-please do not cover with sliding scale insulin.  Repeat CBC and chemistry panel in 1 week   Get Medicines reviewed and adjusted: Please take all your medications with you for your next visit with your Primary MD  Laboratory/radiological data: Please request your Primary MD to go over all hospital tests and procedure/radiological results at the follow up, please ask your Primary MD to get all Hospital records sent to his/her office.  In some cases, they will be blood work, cultures and biopsy results pending  at the time of your discharge. Please request that your primary care M.D. follows up on these results.  Also Note the following: If you experience worsening of your admission symptoms, develop shortness of breath, life threatening emergency, suicidal or homicidal thoughts you must seek medical attention immediately by calling 911 or calling your MD immediately  if symptoms less severe.  You must read complete instructions/literature along with all the possible adverse reactions/side effects for all the Medicines you take and that have been prescribed to you. Take any new Medicines after you have completely understood and accpet all the possible adverse reactions/side effects.   Do not drive when taking Pain medications or sleeping medications (Benzodaizepines)  Do not take more than prescribed Pain, Sleep and Anxiety Medications. It is not advisable to combine anxiety,sleep and pain medications without talking with your primary care practitioner  Special Instructions: If you have smoked or chewed Tobacco  in the last 2 yrs please stop smoking, stop any regular Alcohol  and or any Recreational drug use.  Wear Seat belts while driving.  Please note: You were cared for by a hospitalist during your hospital stay. Once you are discharged, your primary care physician will handle any further medical issues. Please note that NO REFILLS for any discharge medications will be authorized once you are discharged, as it is imperative that you return to your primary care physician (or establish a relationship with a primary care physician if you do not have one) for your post hospital discharge needs so that they can reassess your need for medications and monitor your lab values.   Discharge instructions    Complete by:  As directed    Follow  with Primary MD  Cammy Copa, MD  In 1 week  Please keep a log of your CBG's and take it to your Primary MD at your next follow up  Please get a complete  blood count and chemistry panel checked by your Primary MD at your next visit, and again as instructed by your Primary MD.  Get Medicines reviewed and adjusted: Please take all your medications with you for your next visit with your Primary MD  Laboratory/radiological data: Please request your Primary MD to go over all hospital tests and procedure/radiological results at the follow up, please ask your Primary MD to get all Hospital records sent to his/her office.  In some cases, they will be blood work, cultures and biopsy results pending at the time of your discharge. Please request that your primary care M.D. follows up on these results.  Also Note the following: If you experience worsening of your admission symptoms, develop shortness of breath, life threatening emergency, suicidal or homicidal thoughts you must seek medical attention immediately by calling 911 or calling your MD immediately  if symptoms less severe.  You must read complete instructions/literature along with all the possible adverse reactions/side effects for all the Medicines you take and that have been prescribed to you. Take any new Medicines after you have completely understood and accpet all the possible adverse reactions/side effects.   Do not drive when taking Pain medications or sleeping medications (Benzodaizepines)  Do not take more than prescribed Pain, Sleep and Anxiety Medications. It is not advisable to combine anxiety,sleep and pain medications without talking with your primary care practitioner  Special Instructions: If you have smoked or chewed Tobacco  in the last 2 yrs please stop smoking, stop any regular Alcohol  and or any Recreational drug use.  Wear Seat belts while driving.  Please note: You were cared for by a hospitalist during your hospital stay. Once you are discharged, your primary care physician will handle any further medical issues. Please note that NO REFILLS for any discharge medications  will be authorized once you are discharged, as it is imperative that you return to your primary care physician (or establish a relationship with a primary care physician if you do not have one) for your post hospital discharge needs so that they can reassess your need for medications and monitor your lab values.   Increase activity slowly    Complete by:  As directed      Allergies as of 12/29/2016      Reactions   Alprazolam Other (See Comments)   Family preference, for patient to not take med   Percocet [oxycodone-acetaminophen] Other (See Comments)   Family preference, for patient to not take med   Codeine Diarrhea, Nausea And Vomiting   Doxycycline Diarrhea, Nausea And Vomiting   Hydrocodone Nausea And Vomiting   Omnicef [cefdinir] Nausea Only, Other (See Comments)   Constipation, tolerated Zosyn   Augmentin [amoxicillin-pot Clavulanate] Hives, Rash   Has patient had a PCN reaction causing immediate rash, facial/tongue/throat swelling, SOB or lightheadedness with hypotension: Yes Has patient had a PCN reaction causing severe rash involving mucus membranes or skin necrosis: Yes Did a PCN reaction that required hospitalization No Did PCN reaction occurring within the last 10 years: Yes If all of the above answers are "NO", then may proceed with Cephalosporin use. Pt states she has taken penicillin since, and was ok with it...   Ciprofloxacin Hives   Tolerated LVQ in 12/2016  Medication List    STOP taking these medications   HUMALOG 100 UNIT/ML injection Generic drug:  insulin lispro   insulin glargine 100 UNIT/ML injection Commonly known as:  LANTUS Replaced by:  Insulin Glargine 100 UNIT/ML Solostar Pen   insulin lispro 100 UNIT/ML injection Commonly known as:  HUMALOG Replaced by:  insulin lispro 100 UNIT/ML KiwkPen   NUTRITIONAL SUPPLEMENT Liqd   ondansetron 4 MG disintegrating tablet Commonly known as:  ZOFRAN ODT     TAKE these medications   acetaminophen 325  MG tablet Commonly known as:  TYLENOL Take 650 mg by mouth every 6 (six) hours as needed for mild pain or fever.   amLODipine 5 MG tablet Commonly known as:  NORVASC Take 1 tablet (5 mg total) by mouth daily.   aspirin 81 MG chewable tablet Chew 1 tablet (81 mg total) by mouth daily.   atorvastatin 10 MG tablet Commonly known as:  LIPITOR Take 1 tablet (10 mg total) by mouth daily at 6 PM.   carvedilol 3.125 MG tablet Commonly known as:  COREG Take 1 tablet (3.125 mg total) by mouth 2 (two) times daily with a meal.   clopidogrel 75 MG tablet Commonly known as:  PLAVIX Take 1 tablet (75 mg total) by mouth daily.   escitalopram 10 MG tablet Commonly known as:  LEXAPRO Take 1.5 tablets (15 mg total) by mouth daily. What changed:  medication strength  how much to take  when to take this  Another medication with the same name was removed. Continue taking this medication, and follow the directions you see here.   famotidine 20 MG tablet Commonly known as:  PEPCID Take 1 tablet (20 mg total) by mouth daily.   feeding supplement (PRO-STAT SUGAR FREE 64) Liqd Take 30 mLs by mouth 2 (two) times daily.   glucose monitoring kit monitoring kit 1 each by Does not apply route 4 (four) times daily - after meals and at bedtime. 1 month Diabetic Testing Supplies for QAC-QHS accuchecks.   guaiFENesin 600 MG 12 hr tablet Commonly known as:  MUCINEX Take 1 tablet (600 mg total) by mouth 2 (two) times daily as needed for cough or to loosen phlegm.   Insulin Glargine 100 UNIT/ML Solostar Pen Commonly known as:  LANTUS Inject 4 Units into the skin daily at 10 pm. Replaces:  insulin glargine 100 UNIT/ML injection   insulin lispro 100 UNIT/ML KiwkPen Commonly known as:  HUMALOG JUNIOR KWIKPEN Inject 0.01-0.05 mLs (1-5 Units total) into the skin 3 (three) times daily before meals. For CBGs < 150 = 0 units; 151 - 200 = 1 units, 201 - 250 = 2 units, 251 - 300 = 3 units; 301-350 = 4 units;  351-400 = 5 units; > 400 notify the provider.  DO NOT GIVE IF PATIENT NOT EATING MEA Replaces:  insulin lispro 100 UNIT/ML injection   Insulin Pen Needle 32G X 8 MM Misc Use as directed   iron polysaccharides 150 MG capsule Commonly known as:  NIFEREX Take 1 capsule (150 mg total) by mouth 2 (two) times daily.   levothyroxine 112 MCG tablet Commonly known as:  SYNTHROID, LEVOTHROID Take 1 tablet (112 mcg total) by mouth daily before breakfast.   meclizine 25 MG tablet Commonly known as:  ANTIVERT Take 1 tablet (25 mg total) by mouth 3 (three) times daily as needed for dizziness.   nystatin-triamcinolone ointment Commonly known as:  MYCOLOG Apply ointment every shift to perianal / buttocks for rash   ondansetron  4 MG tablet Commonly known as:  ZOFRAN Take 1 tablet (4 mg total) by mouth every 8 (eight) hours as needed for nausea or vomiting.   traMADol 50 MG tablet Commonly known as:  ULTRAM Take 1 tablet (50 mg total) by mouth every 6 (six) hours as needed.            Durable Medical Equipment        Start     Ordered   12/27/16 1153  For home use only DME Hospital bed  Once    Comments:  12/27/16 Pt for d/c to home today, please arrange delivery with pt daughter, Dulcy Fanny [phone] Delivery address: [place], [place] [address]  Question Answer Comment  Patient has (list medical condition): Brittle diabetes, hx CVA, chronic kidney disease, hypertension, diastolic CHF, recent Healthcare aquired pneumonia.   The above medical condition requires: Patient requires the ability to reposition frequently   Head must be elevated greater than: 30 degrees   Bed type Semi-electric   Support Surface: Alternating Pressure Pad and Pump      12/27/16 1201   12/27/16 1148  For home use only DME Shower stool  Once    Comments:  Plan for d/c to home today.   12/27/16 1201   12/27/16 1147  For home use only DME 3 n 1  Once    Comments:  Pt for d/c to home today.     12/27/16 1201   12/27/16 1146  For home use only DME Walker rolling  Once    Comments:  Plan for d/c to home today 12/27/2016  Question:  Patient needs a walker to treat with the following condition  Answer:  Weakness generalized   12/27/16 1201     Follow-up Information    Cammy Copa, MD. Schedule an appointment as soon as possible for a visit in 1 week.   Specialty:  Family Medicine Why:  Appointment Date: 12/31/2016 '@1'$ :30p  Contact information: [address], [address] [place] [address] [phone]          Allergies  Allergen Reactions  . Alprazolam Other (See Comments)    Family preference, for patient to not take med  . Percocet [Oxycodone-Acetaminophen] Other (See Comments)    Family preference, for patient to not take med  . Codeine Diarrhea and Nausea And Vomiting  . Doxycycline Diarrhea and Nausea And Vomiting  . Hydrocodone Nausea And Vomiting  . Omnicef [Cefdinir] Nausea Only and Other (See Comments)    Constipation, tolerated Zosyn  . Augmentin [Amoxicillin-Pot Clavulanate] Hives and Rash    Has patient had a PCN reaction causing immediate rash, facial/tongue/throat swelling, SOB or lightheadedness with hypotension: Yes Has patient had a PCN reaction causing severe rash involving mucus membranes or skin necrosis: Yes Did a PCN reaction that required hospitalization No Did PCN reaction occurring within the last 10 years: Yes If all of the above answers are "NO", then may proceed with Cephalosporin use.  Pt states she has taken penicillin since, and was ok with it...  . Ciprofloxacin Hives    Tolerated LVQ in 12/2016    Consultations:   None  Other Procedures/Studies: Dg Chest 2 View  Result Date: 12/24/2016 CLINICAL DATA:  Bilateral chest and rib pain for the past 4 days. Recent episode of pneumonia. History of hypertension and CHF. EXAM: CHEST  2 VIEW COMPARISON:  PA and lateral chest x-ray of December 23, 2016 FINDINGS: The lungs are  adequately inflated.  The interstitial markings are more conspicuous today. There are bilateral pleural effusions layering posteriorly which are stable. The heart is normal in size. The perihilar regions are prominent. There is calcification in the wall of the aortic arch. The bony thorax is unremarkable. IMPRESSION: Persistent increased density in the left lower lobe and to a lesser extent on the right. Persistent bilateral pleural effusions layering posteriorly. Mild perihilar interstitial prominence suggests low-grade CHF though the cardiac silhouette is not significantly enlarged. Thoracic aortic atherosclerosis. Electronically Signed   By: David  Martinique M.D.   On: 12/24/2016 07:38   Dg Chest 2 View  Result Date: 12/23/2016 CLINICAL DATA:  Hyperglycemia.  Chills. EXAM: CHEST  2 VIEW COMPARISON:  12/02/2016. FINDINGS: Mediastinum and hilar structures are normal. Low lung volumes. Bibasilar infiltrates. Interim clearing of bilateral pleural effusions. No pneumothorax. Prior cervical spine fusion . IMPRESSION: Bibasilar infiltrates suggesting pneumonia . Interim clearing of bilateral pleural effusions. Electronically Signed   By: Marcello Moores  Register   On: 12/23/2016 08:41   Ct Head Wo Contrast  Result Date: 12/11/2016 CLINICAL DATA:  Worsening neck pain. History of cervical spine fracture. EXAM: CT HEAD WITHOUT CONTRAST CT CERVICAL SPINE WITHOUT CONTRAST TECHNIQUE: Multidetector CT imaging of the head and cervical spine was performed following the standard protocol without intravenous contrast. Multiplanar CT image reconstructions of the cervical spine were also generated. COMPARISON:  CT scan of October 26, 2016. FINDINGS: CT HEAD FINDINGS Brain: Mild chronic ischemic white matter disease is noted. 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 No mass effect or midline shift is noted. Ventricular size is within normal limits. There is no evidence of mass lesion, hemorrhage or acute infarction.  Vascular: Atherosclerosis of carotid siphons is noted. Skull: Normal. Negative for fracture or focal lesion. Sinuses/Orbits: No acute finding. Other: None. CT CERVICAL SPINE FINDINGS Alignment: Minimal grade 1 anterolisthesis of C3-4 and C4-5 is noted secondary to posterior facet joint hypertrophy. Status post surgical anterior fusion of C5-6 with interbody fusion. Skull base and vertebrae: Stable fracture line is seen involving anterior osteophyte of C6-7. Persistent fracture line is seen extending through the anterior aspect of C7 vertebral body compared to prior exam which demonstrates some healing. Stable vertical fracture line seen involving the left side of anterior aspect of T1 vertebral body and osteophytes. Stable fracture involving base of osteophyte anteriorly of T2. Soft tissues and spinal canal: No prevertebral fluid or swelling. No visible canal hematoma. Disc levels: Moderate degenerative disc disease is noted at C6-7 and C7-T1. Upper chest: Negative. Other: Hypertrophy of posterior facet joints is noted bilaterally secondary to degenerative change. IMPRESSION: Mild chronic ischemic white matter disease. No acute intracranial abnormality seen. Postsurgical and degenerative changes are seen involving the cervical spine as described on prior exam. Grossly stable appearance of fractures involving anterior osteophytes of C6, C7, T1 and T2 compared to prior exam. Persistent fracture line is seen involving the anterior portion of C7 vertebral body, but does demonstrate some healing compared to prior exam. Stable appearance of vertical fracture line is seen involving left side of anterior aspect of T1 vertebral body. No new fracture is noted in the cervical spine. Electronically Signed   By: Marijo Conception, M.D.   On: 12/11/2016 21:56   Ct Cervical Spine Wo Contrast  Result Date: 12/11/2016 CLINICAL DATA:  Worsening neck pain. History of cervical spine fracture. EXAM: CT HEAD WITHOUT CONTRAST CT  CERVICAL SPINE WITHOUT CONTRAST TECHNIQUE: Multidetector CT imaging of the head and cervical spine was performed following  the standard protocol without intravenous contrast. Multiplanar CT image reconstructions of the cervical spine were also generated. COMPARISON:  CT scan of October 26, 2016. FINDINGS: CT HEAD FINDINGS Brain: Mild chronic ischemic white matter disease is noted. 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 No mass effect or midline shift is noted. Ventricular size is within normal limits. There is no evidence of mass lesion, hemorrhage or acute infarction. Vascular: Atherosclerosis of carotid siphons is noted. Skull: Normal. Negative for fracture or focal lesion. Sinuses/Orbits: No acute finding. Other: None. CT CERVICAL SPINE FINDINGS Alignment: Minimal grade 1 anterolisthesis of C3-4 and C4-5 is noted secondary to posterior facet joint hypertrophy. Status post surgical anterior fusion of C5-6 with interbody fusion. Skull base and vertebrae: Stable fracture line is seen involving anterior osteophyte of C6-7. Persistent fracture line is seen extending through the anterior aspect of C7 vertebral body compared to prior exam which demonstrates some healing. Stable vertical fracture line seen involving the left side of anterior aspect of T1 vertebral body and osteophytes. Stable fracture involving base of osteophyte anteriorly of T2. Soft tissues and spinal canal: No prevertebral fluid or swelling. No visible canal hematoma. Disc levels: Moderate degenerative disc disease is noted at C6-7 and C7-T1. Upper chest: Negative. Other: Hypertrophy of posterior facet joints is noted bilaterally secondary to degenerative change. IMPRESSION: Mild chronic ischemic white matter disease. No acute intracranial abnormality seen. Postsurgical and degenerative changes are seen involving the cervical spine as described on prior exam. Grossly stable appearance of fractures involving anterior osteophytes of C6,  C7, T1 and T2 compared to prior exam. Persistent fracture line is seen involving the anterior portion of C7 vertebral body, but does demonstrate some healing compared to prior exam. Stable appearance of vertical fracture line is seen involving left side of anterior aspect of T1 vertebral body. No new fracture is noted in the cervical spine. Electronically Signed   By: Marijo Conception, M.D.   On: 12/11/2016 21:56   Dg Chest Portable 1 View  Result Date: 12/02/2016 CLINICAL DATA:  Nausea for 2 days.  Known cervical fracture. EXAM: PORTABLE CHEST 1 VIEW COMPARISON:  Chest radiograph 10/16/2016 FINDINGS: Cervical collar obscures evaluation of the upper chest. Cardiomediastinal contours are within normal limits. No focal airspace consolidation or pulmonary edema. Suspect small bilateral pleural effusions. No pneumothorax. IMPRESSION: Suspected small bilateral pleural effusions. No focal airspace disease. Electronically Signed   By: Ulyses Jarred M.D.   On: 12/02/2016 20:58     TODAY-DAY OF DISCHARGE:  Subjective:   Khaliyah Dacus today has no headache,no chest abdominal pain,no new weakness tingling or numbness, feels much  Objective:   Blood pressure (!) 142/60, pulse 81, temperature 97.6 F (36.4 C), temperature source Oral, resp. rate 18, height '5\' 2"'$  (1.575 m), weight 55.2 kg (121 lb 11.2 oz), SpO2 98 %.  Intake/Output Summary (Last 24 hours) at 12/29/16 1104 Last data filed at 12/29/16 0913  Gross per 24 hour  Intake           1197.5 ml  Output              252 ml  Net            945.5 ml   Filed Weights   12/26/16 2200 12/27/16 2027 12/28/16 2151  Weight: 55.8 kg (123 lb 1.6 oz) 55.5 kg (122 lb 6.4 oz) 55.2 kg (121 lb 11.2 oz)    Exam: Awake Alert, Oriented *3, No new F.N deficits, Normal affect [place].AT,PERRAL Supple  Neck,No JVD, No cervical lymphadenopathy appriciated.  Symmetrical Chest wall movement, Good air movement bilaterally, CTAB RRR,No Gallops,Rubs or new Murmurs, No  Parasternal Heave +ve B.Sounds, Abd Soft, Non tender, No organomegaly appriciated, No rebound -guarding or rigidity. No Cyanosis, Clubbing or edema, No new Rash or bruise   PERTINENT RADIOLOGIC STUDIES: Dg Chest 2 View  Result Date: 12/24/2016 CLINICAL DATA:  Bilateral chest and rib pain for the past 4 days. Recent episode of pneumonia. History of hypertension and CHF. EXAM: CHEST  2 VIEW COMPARISON:  PA and lateral chest x-ray of December 23, 2016 FINDINGS: The lungs are adequately inflated. The interstitial markings are more conspicuous today. There are bilateral pleural effusions layering posteriorly which are stable. The heart is normal in size. The perihilar regions are prominent. There is calcification in the wall of the aortic arch. The bony thorax is unremarkable. IMPRESSION: Persistent increased density in the left lower lobe and to a lesser extent on the right. Persistent bilateral pleural effusions layering posteriorly. Mild perihilar interstitial prominence suggests low-grade CHF though the cardiac silhouette is not significantly enlarged. Thoracic aortic atherosclerosis. Electronically Signed   By: David  Martinique M.D.   On: 12/24/2016 07:38   Dg Chest 2 View  Result Date: 12/23/2016 CLINICAL DATA:  Hyperglycemia.  Chills. EXAM: CHEST  2 VIEW COMPARISON:  12/02/2016. FINDINGS: Mediastinum and hilar structures are normal. Low lung volumes. Bibasilar infiltrates. Interim clearing of bilateral pleural effusions. No pneumothorax. Prior cervical spine fusion . IMPRESSION: Bibasilar infiltrates suggesting pneumonia . Interim clearing of bilateral pleural effusions. Electronically Signed   By: Marcello Moores  Register   On: 12/23/2016 08:41   Ct Head Wo Contrast  Result Date: 12/11/2016 CLINICAL DATA:  Worsening neck pain. History of cervical spine fracture. EXAM: CT HEAD WITHOUT CONTRAST CT CERVICAL SPINE WITHOUT CONTRAST TECHNIQUE: Multidetector CT imaging of the head and cervical spine was performed  following the standard protocol without intravenous contrast. Multiplanar CT image reconstructions of the cervical spine were also generated. COMPARISON:  CT scan of October 26, 2016. FINDINGS: CT HEAD FINDINGS Brain: Mild chronic ischemic white matter disease is noted. 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 No mass effect or midline shift is noted. Ventricular size is within normal limits. There is no evidence of mass lesion, hemorrhage or acute infarction. Vascular: Atherosclerosis of carotid siphons is noted. Skull: Normal. Negative for fracture or focal lesion. Sinuses/Orbits: No acute finding. Other: None. CT CERVICAL SPINE FINDINGS Alignment: Minimal grade 1 anterolisthesis of C3-4 and C4-5 is noted secondary to posterior facet joint hypertrophy. Status post surgical anterior fusion of C5-6 with interbody fusion. Skull base and vertebrae: Stable fracture line is seen involving anterior osteophyte of C6-7. Persistent fracture line is seen extending through the anterior aspect of C7 vertebral body compared to prior exam which demonstrates some healing. Stable vertical fracture line seen involving the left side of anterior aspect of T1 vertebral body and osteophytes. Stable fracture involving base of osteophyte anteriorly of T2. Soft tissues and spinal canal: No prevertebral fluid or swelling. No visible canal hematoma. Disc levels: Moderate degenerative disc disease is noted at C6-7 and C7-T1. Upper chest: Negative. Other: Hypertrophy of posterior facet joints is noted bilaterally secondary to degenerative change. IMPRESSION: Mild chronic ischemic white matter disease. No acute intracranial abnormality seen. Postsurgical and degenerative changes are seen involving the cervical spine as described on prior exam. Grossly stable appearance of fractures involving anterior osteophytes of C6, C7, T1 and T2 compared to prior exam. Persistent fracture line is  seen involving the anterior portion of C7  vertebral body, but does demonstrate some healing compared to prior exam. Stable appearance of vertical fracture line is seen involving left side of anterior aspect of T1 vertebral body. No new fracture is noted in the cervical spine. Electronically Signed   By: Marijo Conception, M.D.   On: 12/11/2016 21:56   Ct Cervical Spine Wo Contrast  Result Date: 12/11/2016 CLINICAL DATA:  Worsening neck pain. History of cervical spine fracture. EXAM: CT HEAD WITHOUT CONTRAST CT CERVICAL SPINE WITHOUT CONTRAST TECHNIQUE: Multidetector CT imaging of the head and cervical spine was performed following the standard protocol without intravenous contrast. Multiplanar CT image reconstructions of the cervical spine were also generated. COMPARISON:  CT scan of October 26, 2016. FINDINGS: CT HEAD FINDINGS Brain: Mild chronic ischemic white matter disease is noted. 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 No mass effect or midline shift is noted. Ventricular size is within normal limits. There is no evidence of mass lesion, hemorrhage or acute infarction. Vascular: Atherosclerosis of carotid siphons is noted. Skull: Normal. Negative for fracture or focal lesion. Sinuses/Orbits: No acute finding. Other: None. CT CERVICAL SPINE FINDINGS Alignment: Minimal grade 1 anterolisthesis of C3-4 and C4-5 is noted secondary to posterior facet joint hypertrophy. Status post surgical anterior fusion of C5-6 with interbody fusion. Skull base and vertebrae: Stable fracture line is seen involving anterior osteophyte of C6-7. Persistent fracture line is seen extending through the anterior aspect of C7 vertebral body compared to prior exam which demonstrates some healing. Stable vertical fracture line seen involving the left side of anterior aspect of T1 vertebral body and osteophytes. Stable fracture involving base of osteophyte anteriorly of T2. Soft tissues and spinal canal: No prevertebral fluid or swelling. No visible canal hematoma.  Disc levels: Moderate degenerative disc disease is noted at C6-7 and C7-T1. Upper chest: Negative. Other: Hypertrophy of posterior facet joints is noted bilaterally secondary to degenerative change. IMPRESSION: Mild chronic ischemic white matter disease. No acute intracranial abnormality seen. Postsurgical and degenerative changes are seen involving the cervical spine as described on prior exam. Grossly stable appearance of fractures involving anterior osteophytes of C6, C7, T1 and T2 compared to prior exam. Persistent fracture line is seen involving the anterior portion of C7 vertebral body, but does demonstrate some healing compared to prior exam. Stable appearance of vertical fracture line is seen involving left side of anterior aspect of T1 vertebral body. No new fracture is noted in the cervical spine. Electronically Signed   By: Marijo Conception, M.D.   On: 12/11/2016 21:56   Dg Chest Portable 1 View  Result Date: 12/02/2016 CLINICAL DATA:  Nausea for 2 days.  Known cervical fracture. EXAM: PORTABLE CHEST 1 VIEW COMPARISON:  Chest radiograph 10/16/2016 FINDINGS: Cervical collar obscures evaluation of the upper chest. Cardiomediastinal contours are within normal limits. No focal airspace consolidation or pulmonary edema. Suspect small bilateral pleural effusions. No pneumothorax. IMPRESSION: Suspected small bilateral pleural effusions. No focal airspace disease. Electronically Signed   By: Ulyses Jarred M.D.   On: 12/02/2016 20:58     PERTINENT LAB RESULTS: CBC:  Recent Labs  12/28/16 1232  WBC 6.4  HGB 8.0*  HCT 23.6*  PLT 289   CMET CMP     Component Value Date/Time   NA 138 12/28/2016 1232   NA 141 11/04/2016   NA 136 06/15/2014 1507   K 4.3 12/28/2016 1232   K 3.3 (L) 06/15/2014 1507   CL 108  12/28/2016 1232   CL 99 06/15/2014 1507   CO2 24 12/28/2016 1232   CO2 25 06/15/2014 1507   GLUCOSE 173 (H) 12/28/2016 1232   GLUCOSE 134 (H) 06/15/2014 1507   BUN 18 12/28/2016 1232     BUN 26 (A) 11/04/2016   BUN 16 06/15/2014 1507   CREATININE 1.48 (H) 12/28/2016 1232   CREATININE 1.17 06/15/2014 1507   CALCIUM 7.7 (L) 12/28/2016 1232   CALCIUM 9.4 06/15/2014 1507   PROT 3.9 (L) 12/24/2016 0115   PROT 5.8 (L) 10/06/2013 2238   ALBUMIN 1.5 (L) 12/24/2016 0115   ALBUMIN 2.7 (L) 10/06/2013 2238   AST 23 12/24/2016 0115   AST 28 10/06/2013 2238   ALT 16 12/24/2016 0115   ALT 19 10/06/2013 2238   ALKPHOS 91 12/24/2016 0115   ALKPHOS 80 10/06/2013 2238   BILITOT 0.4 12/24/2016 0115   BILITOT 0.2 10/06/2013 2238   GFRNONAA 35 (L) 12/28/2016 1232   GFRNONAA 49 (L) 06/15/2014 1507   GFRAA 41 (L) 12/28/2016 1232   GFRAA 56 (L) 06/15/2014 1507    GFR Estimated Creatinine Clearance: 28.4 mL/min (by C-G formula based on SCr of 1.48 mg/dL (H)). No results for input(s): LIPASE, AMYLASE in the last 72 hours. No results for input(s): CKTOTAL, CKMB, CKMBINDEX, TROPONINI in the last 72 hours. Invalid input(s): POCBNP No results for input(s): DDIMER in the last 72 hours. No results for input(s): HGBA1C in the last 72 hours. No results for input(s): CHOL, HDL, LDLCALC, TRIG, CHOLHDL, LDLDIRECT in the last 72 hours. No results for input(s): TSH, T4TOTAL, T3FREE, THYROIDAB in the last 72 hours.  Invalid input(s): FREET3 No results for input(s): VITAMINB12, FOLATE, FERRITIN, TIBC, IRON, RETICCTPCT in the last 72 hours. Coags: No results for input(s): INR in the last 72 hours.  Invalid input(s): PT Microbiology: Recent Results (from the past 240 hour(s))  Urine culture     Status: Abnormal   Collection Time: 12/23/16  8:07 AM  Result Value Ref Range Status   Specimen Description URINE, RANDOM  Final   Special Requests NONE  Final   Culture >=100,000 COLONIES/mL ENTEROBACTER AEROGENES (A)  Final   Report Status 12/25/2016 FINAL  Final   Organism ID, Bacteria ENTEROBACTER AEROGENES (A)  Final      Susceptibility   Enterobacter aerogenes - MIC*    CEFAZOLIN >=64  RESISTANT Resistant     CEFTRIAXONE 16 INTERMEDIATE Intermediate     CIPROFLOXACIN <=0.25 SENSITIVE Sensitive     GENTAMICIN <=1 SENSITIVE Sensitive     IMIPENEM 1 SENSITIVE Sensitive     NITROFURANTOIN 64 INTERMEDIATE Intermediate     TRIMETH/SULFA <=20 SENSITIVE Sensitive     PIP/TAZO 32 INTERMEDIATE Intermediate     * >=100,000 COLONIES/mL ENTEROBACTER AEROGENES  Blood culture (routine x 2)     Status: None   Collection Time: 12/23/16 10:30 AM  Result Value Ref Range Status   Specimen Description BLOOD RIGHT ANTECUBITAL  Final   Special Requests BOTTLES DRAWN AEROBIC ONLY 5CC  Final   Culture NO GROWTH 5 DAYS  Final   Report Status 12/28/2016 FINAL  Final  Blood culture (routine x 2)     Status: None   Collection Time: 12/23/16 10:38 AM  Result Value Ref Range Status   Specimen Description BLOOD RIGHT FOREARM  Final   Special Requests BOTTLES DRAWN AEROBIC ONLY 5CC  Final   Culture NO GROWTH 5 DAYS  Final   Report Status 12/28/2016 FINAL  Final  MRSA PCR Screening  Status: None   Collection Time: 12/23/16  8:26 PM  Result Value Ref Range Status   MRSA by PCR NEGATIVE NEGATIVE Final    Comment:        The GeneXpert MRSA Assay (FDA approved for NASAL specimens only), is one component of a comprehensive MRSA colonization surveillance program. It is not intended to diagnose MRSA infection nor to guide or monitor treatment for MRSA infections.   Respiratory Panel by PCR     Status: None   Collection Time: 12/24/16  5:01 PM  Result Value Ref Range Status   Adenovirus NOT DETECTED NOT DETECTED Final   Coronavirus 229E NOT DETECTED NOT DETECTED Final   Coronavirus HKU1 NOT DETECTED NOT DETECTED Final   Coronavirus NL63 NOT DETECTED NOT DETECTED Final   Coronavirus OC43 NOT DETECTED NOT DETECTED Final   Metapneumovirus NOT DETECTED NOT DETECTED Final   Rhinovirus / Enterovirus NOT DETECTED NOT DETECTED Final   Influenza A NOT DETECTED NOT DETECTED Final   Influenza B NOT  DETECTED NOT DETECTED Final   Parainfluenza Virus 1 NOT DETECTED NOT DETECTED Final   Parainfluenza Virus 2 NOT DETECTED NOT DETECTED Final   Parainfluenza Virus 3 NOT DETECTED NOT DETECTED Final   Parainfluenza Virus 4 NOT DETECTED NOT DETECTED Final   Respiratory Syncytial Virus NOT DETECTED NOT DETECTED Final   Bordetella pertussis NOT DETECTED NOT DETECTED Final   Chlamydophila pneumoniae NOT DETECTED NOT DETECTED Final   Mycoplasma pneumoniae NOT DETECTED NOT DETECTED Final    FURTHER DISCHARGE INSTRUCTIONS:  Get Medicines reviewed and adjusted: Please take all your medications with you for your next visit with your Primary MD  Laboratory/radiological data: Please request your Primary MD to go over all hospital tests and procedure/radiological results at the follow up, please ask your Primary MD to get all Hospital records sent to his/her office.  In some cases, they will be blood work, cultures and biopsy results pending at the time of your discharge. Please request that your primary care M.D. goes through all the records of your hospital data and follows up on these results.  Also Note the following: If you experience worsening of your admission symptoms, develop shortness of breath, life threatening emergency, suicidal or homicidal thoughts you must seek medical attention immediately by calling 911 or calling your MD immediately  if symptoms less severe.  You must read complete instructions/literature along with all the possible adverse reactions/side effects for all the Medicines you take and that have been prescribed to you. Take any new Medicines after you have completely understood and accpet all the possible adverse reactions/side effects.   Do not drive when taking Pain medications or sleeping medications (Benzodaizepines)  Do not take more than prescribed Pain, Sleep and Anxiety Medications. It is not advisable to combine anxiety,sleep and pain medications without talking  with your primary care practitioner  Special Instructions: If you have smoked or chewed Tobacco  in the last 2 yrs please stop smoking, stop any regular Alcohol  and or any Recreational drug use.  Wear Seat belts while driving.  Please note: You were cared for by a hospitalist during your hospital stay. Once you are discharged, your primary care physician will handle any further medical issues. Please note that NO REFILLS for any discharge medications will be authorized once you are discharged, as it is imperative that you return to your primary care physician (or establish a relationship with a primary care physician if you do not have one) for your post  hospital discharge needs so that they can reassess your need for medications and monitor your lab values.  Total Time spent coordinating discharge including counseling, education and face to face time equals 45 minutes.  SignedOren Binet 12/29/2016 11:04 AM

## 2016-12-26 NOTE — Progress Notes (Signed)
Due to financial issues-patient unable to discharge back to SNF. Per RN, patient's daughter needs arrangement of home health services, needs numerous equipments including a hospital bed before patient can be discharged home. Hold discharge for today, case management to evaluate tomorrow morning to facilitate discharge.

## 2016-12-27 LAB — GLUCOSE, CAPILLARY
GLUCOSE-CAPILLARY: 232 mg/dL — AB (ref 65–99)
GLUCOSE-CAPILLARY: 217 mg/dL — AB (ref 65–99)
Glucose-Capillary: 146 mg/dL — ABNORMAL HIGH (ref 65–99)
Glucose-Capillary: 181 mg/dL — ABNORMAL HIGH (ref 65–99)

## 2016-12-27 MED ORDER — MECLIZINE HCL 25 MG PO TABS: 25.0000 mg | ORAL_TABLET | Freq: Three times a day (TID) | ORAL | 0 refills | Status: DC | PRN

## 2016-12-27 MED ORDER — NYSTATIN-TRIAMCINOLONE 100000-0.1 UNIT/GM-% EX OINT: TOPICAL_OINTMENT | CUTANEOUS | 0 refills | Status: DC

## 2016-12-27 MED ORDER — FAMOTIDINE 20 MG PO TABS: 20.0000 mg | ORAL_TABLET | Freq: Every day | ORAL | 0 refills | Status: DC

## 2016-12-27 MED ORDER — CARVEDILOL 3.125 MG PO TABS: 3.1250 mg | ORAL_TABLET | Freq: Two times a day (BID) | ORAL | 0 refills | Status: DC

## 2016-12-27 MED ORDER — ESCITALOPRAM OXALATE 10 MG PO TABS: 15.0000 mg | ORAL_TABLET | Freq: Every day | ORAL | 0 refills | Status: DC

## 2016-12-27 MED ORDER — AMLODIPINE BESYLATE 5 MG PO TABS: 5.0000 mg | ORAL_TABLET | Freq: Every day | ORAL | 0 refills | Status: DC

## 2016-12-27 MED ORDER — INSULIN LISPRO 100 UNIT/ML (KWIKPEN): PEN_INJECTOR | SUBCUTANEOUS | 0 refills | Status: DC

## 2016-12-27 MED ORDER — POLYSACCHARIDE IRON COMPLEX 150 MG PO CAPS: 150.0000 mg | ORAL_CAPSULE | Freq: Two times a day (BID) | ORAL | 0 refills | Status: DC

## 2016-12-27 MED ORDER — INSULIN GLARGINE 100 UNIT/ML SOLOSTAR PEN: 4.0000 [IU] | PEN_INJECTOR | Freq: Every day | SUBCUTANEOUS | 0 refills | Status: DC

## 2016-12-27 MED ORDER — ONDANSETRON 4 MG PO TBDP: 4.0000 mg | ORAL_TABLET | Freq: Three times a day (TID) | ORAL | 0 refills | Status: DC | PRN

## 2016-12-27 MED ORDER — INSULIN LISPRO 100 UNIT/ML (KWIKPEN)
PEN_INJECTOR | SUBCUTANEOUS | 0 refills | Status: DC
Start: 2016-12-27 — End: 2016-12-27

## 2016-12-27 MED ORDER — FREESTYLE SYSTEM KIT: 1.0000 | PACK | Freq: Three times a day (TID) | 1 refills | Status: DC

## 2016-12-27 MED ORDER — ATORVASTATIN CALCIUM 10 MG PO TABS: 10.0000 mg | ORAL_TABLET | Freq: Every day | ORAL | 0 refills | Status: DC

## 2016-12-27 MED ORDER — TRAMADOL HCL 50 MG PO TABS: 50.0000 mg | ORAL_TABLET | Freq: Four times a day (QID) | ORAL | 0 refills | Status: DC | PRN

## 2016-12-27 MED ORDER — LEVOTHYROXINE SODIUM 112 MCG PO TABS: 112.0000 ug | ORAL_TABLET | Freq: Every day | ORAL | 0 refills | Status: DC

## 2016-12-27 MED ORDER — GUAIFENESIN ER 600 MG PO TB12: 600.0000 mg | ORAL_TABLET | Freq: Two times a day (BID) | ORAL | 0 refills | Status: DC | PRN

## 2016-12-27 MED ORDER — CLOPIDOGREL BISULFATE 75 MG PO TABS: 75.0000 mg | ORAL_TABLET | Freq: Every day | ORAL | 0 refills | Status: DC

## 2016-12-27 MED ORDER — PRO-STAT SUGAR FREE PO LIQD: 30.0000 mL | Freq: Two times a day (BID) | ORAL | 0 refills | Status: DC

## 2016-12-27 MED ORDER — ASPIRIN 81 MG PO CHEW: 81.0000 mg | CHEWABLE_TABLET | Freq: Every day | ORAL | 0 refills | Status: DC

## 2016-12-27 MED ORDER — INSULIN PEN NEEDLE 32G X 8 MM MISC: 0 refills | Status: DC

## 2016-12-27 NOTE — Progress Notes (Signed)
Seen and examined, sleeping comfortably in bed this morning. Denies any complaints to me apart from slight nausea that she had earlier. She is aware that she cannot be discharged to SNF due to financial issues, she is willing to be discharged to her daughter's house. Discharge summary done yesterday addended. Stable for discharge

## 2016-12-27 NOTE — Care Management Important Message (Signed)
Important Message  Patient Details  Name: Cassandra [name] MRN: [id number] Date of Birth: 06-15-1946   Medicare Important Message Given:  Yes    [name] 12/27/2016, 1:00 PM

## 2016-12-27 NOTE — Progress Notes (Addendum)
Hospital bed is going to be delivered between 4 and 8 pm at patient's daughter house and patient's daughter is going to pick the patient after that delivery.

## 2016-12-27 NOTE — Progress Notes (Signed)
PT Cancellation Note  Patient Details Name: Cassandra [name] MRN: [id number] DOB: 08-Jun-1946   Cancelled Treatment:    Reason Eval/Treat Not Completed: Fatigue/lethargy limiting ability to participate;Patient declined, no reason specified.   Patient states she cannot "do therapy" today.  Will attempt to return later in day as time allows.  Also noted patient's IV sitting on her bedside table.  When asked about her IV, patient states "It's been out for several weeks.  There was just tape so I pulled it off."  RN notified IV out.   Vena AustriaSusan H Aeneas Longsworth 12/27/2016, 3:21 PM Durenda HurtSusan H. Renaldo Fiddleravis, PT, [facility] Acute Rehab Services Pager [phone]

## 2016-12-27 NOTE — Care Management Note (Signed)
Case Management Note  Patient Details  Name: Cassandra [name] MRN: [id number] Date of Birth: Oct 25, 1946  Subjective/Objective:       CM following for progression and d/c planning.              Action/Plan: 12/27/2016 Spoke with pt daughter, [name]. Pt will d/c to her home  HH arranged with [facility], and DME ordered [facility] for delivery to [address], [place] , [place] [id number]. Ph [phone].  Ms Brooke DareKing is working on sitters to assist at home while she works. [facility] re plan to [facility]/c this pt today and need for DME to be delivered.   Expected Discharge Date:       12/27/2016           Expected Discharge Plan:  Home w Home Health Services  In-House Referral:  NA  Discharge planning Services  CM Consult  Post Acute Care Choice:  Durable Medical Equipment, Home Health Choice offered to:  Adult Children  DME Arranged:  Shower stool, Hospital bed, 3-N-1, Walker rolling DME Agency:  [facility]  HH Arranged:  RN, PT, OT, Nurse's Aide, Social Work [employer] [facility]  Status of Service:  Completed, signed off  If discussed at [employer] Meetings, dates discussed:    Additional Comments:  Starlyn SkeansRoyal, Michelle Wnek U, RN 12/27/2016, 12:13 PM

## 2016-12-28 DIAGNOSIS — R112 Nausea with vomiting, unspecified: Secondary | ICD-10-CM

## 2016-12-28 LAB — CBC
HEMATOCRIT: 23.6 % — AB (ref 36.0–46.0)
Hemoglobin: 8 g/dL — ABNORMAL LOW (ref 12.0–15.0)
MCH: 30.1 pg (ref 26.0–34.0)
MCHC: 33.9 g/dL (ref 30.0–36.0)
MCV: 88.7 fL (ref 78.0–100.0)
PLATELETS: 289 10*3/uL (ref 150–400)
RBC: 2.66 MIL/uL — ABNORMAL LOW (ref 3.87–5.11)
RDW: 13.6 % (ref 11.5–15.5)
WBC: 6.4 10*3/uL (ref 4.0–10.5)

## 2016-12-28 LAB — CULTURE, BLOOD (ROUTINE X 2)
Culture: NO GROWTH
Culture: NO GROWTH

## 2016-12-28 LAB — BASIC METABOLIC PANEL
ANION GAP: 6 (ref 5–15)
BUN: 18 mg/dL (ref 6–20)
CHLORIDE: 108 mmol/L (ref 101–111)
CO2: 24 mmol/L (ref 22–32)
Calcium: 7.7 mg/dL — ABNORMAL LOW (ref 8.9–10.3)
Creatinine, Ser: 1.48 mg/dL — ABNORMAL HIGH (ref 0.44–1.00)
GFR calc non Af Amer: 35 mL/min — ABNORMAL LOW (ref >60)
GFR, EST AFRICAN AMERICAN: 41 mL/min — AB (ref >60)
Glucose, Bld: 173 mg/dL — ABNORMAL HIGH (ref 65–99)
POTASSIUM: 4.3 mmol/L (ref 3.5–5.1)
SODIUM: 138 mmol/L (ref 135–145)

## 2016-12-28 LAB — GLUCOSE, CAPILLARY
GLUCOSE-CAPILLARY: 143 mg/dL — AB (ref 65–99)
GLUCOSE-CAPILLARY: 228 mg/dL — AB (ref 65–99)
GLUCOSE-CAPILLARY: 206 mg/dL — AB (ref 65–99)
Glucose-Capillary: 267 mg/dL — ABNORMAL HIGH (ref 65–99)

## 2016-12-28 MED ORDER — SODIUM CHLORIDE 0.9 % IV SOLN
INTRAVENOUS | Status: DC
  Administered 2016-12-28: 23:00:00 via INTRAVENOUS

## 2016-12-28 NOTE — Progress Notes (Signed)
Informed by RN that patient vomited and continues to feel nauseous. Patient claims that she does vomit once or twice a week-not sure if she has underlying history of gastroparesis. Had BM earlier today  Chest: Bilaterally clear CVS: S1-S2 regular Abdomen appears to be soft on my exam Neurology: Nonfocal-but generally weak   Plan- CBC/chemistry Antiemetics prn Hold discharge-and monitor overnight.

## 2016-12-28 NOTE — Progress Notes (Signed)
PT Cancellation Note  Patient Details Name: Merril AbbeBillie A Haas MRN: [id number] DOB: [birth year]   Cancelled Treatment:    Reason Eval/Treat Not Completed: Medical issues which prohibited therapy;Patient declined, no reason specified.  Attempted to see patient am/pm.  Patient declined due to N/V and not feeling well.  Will return at later date.   Vena AustriaSusan H Josef Tourigny 12/28/2016, 6:39 PM Durenda HurtSusan H. Renaldo Fiddleravis, PT, [facility] Acute [facility]hab Services Pager [phone]

## 2016-12-28 NOTE — Progress Notes (Signed)
At approximately 2010, patient's daughter - [name] - called and informed this RN that Home Health Agency was coming shortly to install patient's hospital bed.  She was concerned that it would be late before she would be able to pick her mother up and her mother's sundowner would worsen, and it was pouring rain.  I informed [facility] of situation and ok to discharge in AM.  Per Joni ReiningNicole, she would pick mother up by 10 am Saturday.  Will continue to monitor patient.  [employer] MinorKimberly Arnol Mcgibbon RN-BC, WTA.

## 2016-12-28 NOTE — Progress Notes (Signed)
Seen and examined Sitting at bedside-eating breakfast Vital signs stable CBG appears stable Was scheduled to be discharged on 1/12-but spelled bed was delivered late last night-apparently family is supposed to pick her up on 1/13.

## 2016-12-29 LAB — GLUCOSE, CAPILLARY
GLUCOSE-CAPILLARY: 92 mg/dL (ref 65–99)
GLUCOSE-CAPILLARY: 126 mg/dL — AB (ref 65–99)

## 2016-12-29 MED ORDER — ONDANSETRON HCL 4 MG PO TABS: 4.0000 mg | ORAL_TABLET | Freq: Three times a day (TID) | ORAL | 0 refills | Status: DC | PRN

## 2016-12-29 NOTE — Progress Notes (Signed)
Feels better today No further vomiting Abdomen is soft and nontender Stable for discharge I have addended the discharge summary Note-spoke with patient's daughter over the phone on 1/13

## 2017-01-02 ENCOUNTER — Ambulatory Visit: Payer: Self-pay | Admitting: Neurology

## 2017-01-04 ENCOUNTER — Emergency Department: Payer: Medicare Other

## 2017-01-04 ENCOUNTER — Observation Stay
Admission: EM | Admit: 2017-01-04 | Discharge: 2017-01-08 | Disposition: A | Discharge status: Skilled Nursing Facility | Payer: Medicare Other | Attending: Internal Medicine | Admitting: Internal Medicine

## 2017-01-04 DIAGNOSIS — E8809 Other disorders of plasma-protein metabolism, not elsewhere classified: Secondary | ICD-10-CM | POA: Insufficient documentation

## 2017-01-04 DIAGNOSIS — J90 Pleural effusion, not elsewhere classified: Secondary | ICD-10-CM

## 2017-01-04 DIAGNOSIS — K219 Gastro-esophageal reflux disease without esophagitis: Secondary | ICD-10-CM | POA: Diagnosis not present

## 2017-01-04 DIAGNOSIS — I5032 Chronic diastolic (congestive) heart failure: Secondary | ICD-10-CM | POA: Insufficient documentation

## 2017-01-04 DIAGNOSIS — Z8673 Personal history of transient ischemic attack (TIA), and cerebral infarction without residual deficits: Secondary | ICD-10-CM | POA: Insufficient documentation

## 2017-01-04 DIAGNOSIS — K529 Noninfective gastroenteritis and colitis, unspecified: Secondary | ICD-10-CM

## 2017-01-04 DIAGNOSIS — Z79899 Other long term (current) drug therapy: Secondary | ICD-10-CM | POA: Insufficient documentation

## 2017-01-04 DIAGNOSIS — Z885 Allergy status to narcotic agent status: Secondary | ICD-10-CM | POA: Diagnosis not present

## 2017-01-04 DIAGNOSIS — E876 Hypokalemia: Secondary | ICD-10-CM | POA: Insufficient documentation

## 2017-01-04 DIAGNOSIS — R2681 Unsteadiness on feet: Secondary | ICD-10-CM

## 2017-01-04 DIAGNOSIS — N183 Chronic kidney disease, stage 3 (moderate): Secondary | ICD-10-CM | POA: Diagnosis not present

## 2017-01-04 DIAGNOSIS — H409 Unspecified glaucoma: Secondary | ICD-10-CM | POA: Insufficient documentation

## 2017-01-04 DIAGNOSIS — K5641 Fecal impaction: Principal | ICD-10-CM | POA: Insufficient documentation

## 2017-01-04 DIAGNOSIS — R112 Nausea with vomiting, unspecified: Secondary | ICD-10-CM

## 2017-01-04 DIAGNOSIS — Z7902 Long term (current) use of antithrombotics/antiplatelets: Secondary | ICD-10-CM | POA: Insufficient documentation

## 2017-01-04 DIAGNOSIS — E039 Hypothyroidism, unspecified: Secondary | ICD-10-CM | POA: Diagnosis not present

## 2017-01-04 DIAGNOSIS — E1022 Type 1 diabetes mellitus with diabetic chronic kidney disease: Secondary | ICD-10-CM | POA: Insufficient documentation

## 2017-01-04 DIAGNOSIS — I130 Hypertensive heart and chronic kidney disease with heart failure and stage 1 through stage 4 chronic kidney disease, or unspecified chronic kidney disease: Secondary | ICD-10-CM | POA: Insufficient documentation

## 2017-01-04 DIAGNOSIS — J449 Chronic obstructive pulmonary disease, unspecified: Secondary | ICD-10-CM | POA: Diagnosis not present

## 2017-01-04 DIAGNOSIS — Z7982 Long term (current) use of aspirin: Secondary | ICD-10-CM | POA: Insufficient documentation

## 2017-01-04 DIAGNOSIS — Z880 Allergy status to penicillin: Secondary | ICD-10-CM | POA: Insufficient documentation

## 2017-01-04 DIAGNOSIS — F319 Bipolar disorder, unspecified: Secondary | ICD-10-CM | POA: Diagnosis not present

## 2017-01-04 DIAGNOSIS — K5900 Constipation, unspecified: Secondary | ICD-10-CM

## 2017-01-04 DIAGNOSIS — R188 Other ascites: Secondary | ICD-10-CM | POA: Diagnosis not present

## 2017-01-04 DIAGNOSIS — R609 Edema, unspecified: Secondary | ICD-10-CM

## 2017-01-04 DIAGNOSIS — R531 Weakness: Secondary | ICD-10-CM

## 2017-01-04 DIAGNOSIS — R1084 Generalized abdominal pain: Secondary | ICD-10-CM

## 2017-01-04 DIAGNOSIS — E7800 Pure hypercholesterolemia, unspecified: Secondary | ICD-10-CM | POA: Insufficient documentation

## 2017-01-04 DIAGNOSIS — R109 Unspecified abdominal pain: Secondary | ICD-10-CM

## 2017-01-04 LAB — GLUCOSE, CAPILLARY
GLUCOSE-CAPILLARY: 179 mg/dL — AB (ref 65–99)
GLUCOSE-CAPILLARY: 169 mg/dL — AB (ref 65–99)

## 2017-01-04 LAB — CBC
HCT: 34.4 % — ABNORMAL LOW (ref 35.0–47.0)
Hemoglobin: 12.1 g/dL (ref 12.0–16.0)
MCH: 31.2 pg (ref 26.0–34.0)
MCHC: 35.1 g/dL (ref 32.0–36.0)
MCV: 88.7 fL (ref 80.0–100.0)
PLATELETS: 485 10*3/uL — AB (ref 150–440)
RBC: 3.88 MIL/uL (ref 3.80–5.20)
RDW: 14.4 % (ref 11.5–14.5)
WBC: 12.2 10*3/uL — AB (ref 3.6–11.0)

## 2017-01-04 LAB — COMPREHENSIVE METABOLIC PANEL
ALT: 24 U/L (ref 14–54)
AST: 45 U/L — ABNORMAL HIGH (ref 15–41)
Albumin: 2.5 g/dL — ABNORMAL LOW (ref 3.5–5.0)
Alkaline Phosphatase: 131 U/L — ABNORMAL HIGH (ref 38–126)
Anion gap: 13 (ref 5–15)
BUN: 17 mg/dL (ref 6–20)
CALCIUM: 8.6 mg/dL — AB (ref 8.9–10.3)
CO2: 19 mmol/L — AB (ref 22–32)
CREATININE: 1.27 mg/dL — AB (ref 0.44–1.00)
Chloride: 106 mmol/L (ref 101–111)
GFR calc non Af Amer: 42 mL/min — ABNORMAL LOW (ref >60)
GFR, EST AFRICAN AMERICAN: 49 mL/min — AB (ref >60)
Glucose, Bld: 267 mg/dL — ABNORMAL HIGH (ref 65–99)
Potassium: 3.3 mmol/L — ABNORMAL LOW (ref 3.5–5.1)
Sodium: 138 mmol/L (ref 135–145)
Total Bilirubin: 0.7 mg/dL (ref 0.3–1.2)
Total Protein: 6.6 g/dL (ref 6.5–8.1)

## 2017-01-04 LAB — LIPASE, BLOOD: Lipase: 20 U/L (ref 11–51)

## 2017-01-04 MED ORDER — ALBUTEROL SULFATE (2.5 MG/3ML) 0.083% IN NEBU: 2.5000 mg | INHALATION_SOLUTION | Freq: Four times a day (QID) | RESPIRATORY_TRACT | Status: DC | PRN

## 2017-01-04 MED ORDER — SODIUM CHLORIDE 0.9 % IV BOLUS (SEPSIS): 1000.0000 mL | Freq: Once | INTRAVENOUS | Status: DC

## 2017-01-04 MED ORDER — INSULIN ASPART 100 UNIT/ML ~~LOC~~ SOLN
0.0000 [IU] | Freq: Three times a day (TID) | SUBCUTANEOUS | Status: DC
  Administered 2017-01-05 (×2): 3 [IU] via SUBCUTANEOUS
  Administered 2017-01-05 – 2017-01-06 (×2): 2 [IU] via SUBCUTANEOUS
  Administered 2017-01-06 (×2): 3 [IU] via SUBCUTANEOUS
  Administered 2017-01-07 (×2): 2 [IU] via SUBCUTANEOUS
  Filled 2017-01-04 (×2): qty 2
  Filled 2017-01-04 (×3): qty 3
  Filled 2017-01-04: qty 2
  Filled 2017-01-04: qty 3

## 2017-01-04 MED ORDER — ONDANSETRON HCL 4 MG PO TABS: 4.0000 mg | ORAL_TABLET | Freq: Four times a day (QID) | ORAL | Status: DC | PRN

## 2017-01-04 MED ORDER — INSULIN ASPART 100 UNIT/ML ~~LOC~~ SOLN
0.0000 [IU] | Freq: Every day | SUBCUTANEOUS | Status: DC
  Administered 2017-01-06 – 2017-01-07 (×2): 2 [IU] via SUBCUTANEOUS
  Filled 2017-01-04 (×3): qty 2

## 2017-01-04 MED ORDER — SODIUM CHLORIDE 0.9 % IV SOLN
INTRAVENOUS | Status: DC
  Administered 2017-01-04: 23:00:00 via INTRAVENOUS

## 2017-01-04 MED ORDER — ONDANSETRON HCL 4 MG/2ML IJ SOLN
4.0000 mg | Freq: Four times a day (QID) | INTRAMUSCULAR | Status: DC | PRN
  Administered 2017-01-07 – 2017-01-08 (×2): 4 mg via INTRAVENOUS
  Filled 2017-01-04 (×2): qty 2

## 2017-01-04 MED ORDER — IOPAMIDOL (ISOVUE-300) INJECTION 61%
15.0000 mL | INTRAVENOUS | Status: AC
  Administered 2017-01-04: 15 mL via ORAL

## 2017-01-04 MED ORDER — ENOXAPARIN SODIUM 40 MG/0.4ML ~~LOC~~ SOLN
40.0000 mg | Freq: Every day | SUBCUTANEOUS | Status: DC
  Administered 2017-01-05 – 2017-01-06 (×3): 40 mg via SUBCUTANEOUS
  Filled 2017-01-04 (×3): qty 0.4

## 2017-01-04 MED ORDER — IOPAMIDOL (ISOVUE-300) INJECTION 61%
75.0000 mL | Freq: Once | INTRAVENOUS | Status: AC | PRN
  Administered 2017-01-04: 75 mL via INTRAVENOUS

## 2017-01-04 MED ORDER — MORPHINE SULFATE (PF) 4 MG/ML IV SOLN
4.0000 mg | Freq: Once | INTRAVENOUS | Status: AC
  Administered 2017-01-04: 4 mg via INTRAVENOUS
  Filled 2017-01-04: qty 1

## 2017-01-04 MED ORDER — ONDANSETRON HCL 4 MG/2ML IJ SOLN
4.0000 mg | Freq: Once | INTRAMUSCULAR | Status: AC
  Administered 2017-01-04: 4 mg via INTRAVENOUS
  Filled 2017-01-04: qty 2

## 2017-01-04 NOTE — H&P (Signed)
History and Physical   [facility] - [place] @ [facility] Admission History and Physical [employer], D.O.    Patient Name: Cassandra [name] MR#: [id number] Date of Birth: 07/18/1946 Date of Admission: 01/04/2017  Referring MD/NP/PA: Dr. Corky Downs Primary Care Physician: Cammy Copa, MD Patient coming from: Home  Chief Complaint: Abdominal pain  HPI: Cassandra [name] is a 71 y.o. female with a known history of DM, CKD3, HTN, hypothyroid, history of C. difficile remotely  was in a usual state of health until 2 weeks ago  she was admitted to [facility] for acute encephalopathy as was secondary to age As well as hypoglycemia. She was discharged to skilled nursing facility on 5 days of Levaquin which she finished on January 12. She return to her daughter's home about 5 days ago when she started complaining of crampy abdominal pain. Patient's daughter states that she has had decreased by mouth intake, nausea, generalized weakness. Her daughter states that she believed her iron supplementation was contributing to her constipation therefore discontinued it. She also added a stool softener which seemed to help however over the last 3 days she has been having worsening abdominal pain with decreased flatus.     On questioning patient reports severe crampy abdominal pain. She also reports 1 episode of nonbloody, nonbilious vomiting. The patient's daughters are very concerned about a hernia as she has had one repaired in the past.   Otherwise there has been no change in status. Patient has been taking medication as prescribed and there has been no recent change in medication or diet.  No recent antibiotics.  There has been no recent illness, hospitalizations, travel or sick contacts.    Patient denies fevers/chills, weakness, dizziness, chest pain, shortness of breath, N/V/C/D, abdominal pain, dysuria/frequency, changes in mental status.   ED Course: in the emergency department, fecal  disimpaction was attempted 2 unsuccessfully. She also received soapsuds enema which did not help.  Review of Systems:  CONSTITUTIONAL: No fever/chills, fatigue, weight gain/loss, headache. Positive weakness EYES: No blurry or double vision. ENT: No tinnitus, postnasal drip, redness or soreness of the oropharynx. RESPIRATORY: No cough, dyspnea, wheeze.  No hemoptysis.  CARDIOVASCULAR: No chest pain, palpitations, syncope, orthopnea. Positive dependent edema.  GASTROINTESTINAL: Positive nausea, vomiting, abdominal pain, constipation.  No hematemesis, melena or hematochezia. GENITOURINARY: No dysuria, frequency, hematuria. ENDOCRINE: No polyuria or nocturia. No heat or cold intolerance. HEMATOLOGY: No anemia, bruising, bleeding. INTEGUMENTARY: No rashes, ulcers, lesions. MUSCULOSKELETAL: No arthritis, gout, dyspnea. NEUROLOGIC: No numbness, tingling, ataxia, seizure-type activity, weakness. PSYCHIATRIC: No anxiety, depression, insomnia.   Past Medical History:  Diagnosis Date  . Allergic rhinitis   . Anemia   . Anxiety   . Arthritis    "mostly feet, hands" (10/17/2016)  . Benign hypertension with CKD (chronic kidney disease) stage III   . Benign paroxysmal positional vertigo   . Bipolar disorder ([place])   . Broken finger   . Broken shoulder   . Broken toes   . Cervicalgia   . CHF (congestive heart failure) ([place]) 07/2016  . CKD (chronic kidney disease) stage 3, GFR 30-59 ml/min   . COPD (chronic obstructive pulmonary disease) ([place])   . Depression   . Elevated liver enzymes Hep B/C neg 2014  . Fall at nursing home 10/15/2016  . GERD (gastroesophageal reflux disease)   . Glaucoma   . High cholesterol   . History of alcohol use   . History of blood transfusion 09/2016   "blood got really really low"  .  Hypertension   . Hypothyroidism   . Interstitial cystitis    bladder stretched every 9 months  . Migraines    "pretty much qd" (10/17/2016)  . Pneumonia 12/2016  . Psoriasis    . Stroke ([place]) 05/16/2016   Left occipital and thalamic, right hippocampal  . Thyroid disease   . Tobacco use   . 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 Vitamin B12 deficiency     Past Surgical History:  Procedure Laterality Date  . ANTERIOR CERVICAL DECOMP/DISCECTOMY FUSION  ~ 2009  . APPENDECTOMY    . BACK SURGERY    . BLADDER SUSPENSION    . CATARACT EXTRACTION W/ INTRAOCULAR LENS  IMPLANT, BILATERAL Bilateral   . DILATION AND CURETTAGE OF UTERUS    . HERNIA REPAIR    . LAPAROSCOPIC CHOLECYSTECTOMY    . TONSILLECTOMY    . TUBAL LIGATION    . VAGINAL HYSTERECTOMY     with oophorectomy     reports that she quit smoking about 93 years ago. Her smoking use included Cigarettes. She has a 2.50 pack-year smoking history. She has never used smokeless tobacco. She reports that she does not drink alcohol or use drugs.  Allergies  Allergen Reactions  . Alprazolam Other (See Comments)    Family preference, for patient to not take med  . Percocet [Oxycodone-Acetaminophen] Other (See Comments)    Family preference, for patient to not take med  . Codeine Diarrhea and Nausea And Vomiting  . Doxycycline Diarrhea and Nausea And Vomiting  . Hydrocodone Nausea And Vomiting  . Omnicef [Cefdinir] Nausea Only and Other (See Comments)    Constipation, tolerated Zosyn  . Augmentin [Amoxicillin-Pot Clavulanate] Hives and Rash    Has patient had a PCN reaction causing immediate rash, facial/tongue/throat swelling, SOB or lightheadedness with hypotension: Yes Has patient had a PCN reaction causing severe rash involving mucus membranes or skin necrosis: Yes Did a PCN reaction that required hospitalization No Did PCN reaction occurring within the last 10 years: Yes If all of the above answers are "NO", then may proceed with Cephalosporin use.  Pt states she has taken penicillin since, and was ok with it...  . Ciprofloxacin Hives    Tolerated LVQ in 12/2016    Family History   Problem Relation Age of Onset  . Alcohol abuse Mother   . Arthritis Mother   . Asthma Mother   . Cancer Mother     colon cancer  . Hypertension Mother   . Migraines Mother   . Stroke Mother   . Lung disease Mother   . COPD Mother   . Diabetes Father   . Hypertension Father   . Heart disease Father   . Heart attack Father   . Heart disease Paternal Grandmother   . Diabetes Paternal Grandmother   . Stroke Paternal Grandmother   . Cancer Paternal Grandmother   . Diabetes Paternal Grandfather    Family history has been reviewed and confirmed with patient.   Prior to Admission medications   Medication Sig Start Date End Date Taking? Authorizing Provider  acetaminophen (TYLENOL) 325 MG tablet Take 650 mg by mouth every 6 (six) hours as needed for mild pain or fever.     Historical Provider, MD  Amino Acids-Protein Hydrolys (FEEDING SUPPLEMENT, PRO-STAT SUGAR FREE 64,) LIQD Take 30 mLs by mouth 2 (two) times daily. 12/27/16   Shanker Kristeen Mans, MD  amLODipine (NORVASC) 5 MG tablet Take 1 tablet (5 mg total)  by mouth daily. 12/27/16   Shanker Kristeen Mans, MD  aspirin 81 MG chewable tablet Chew 1 tablet (81 mg total) by mouth daily. 12/27/16   Shanker Kristeen Mans, MD  atorvastatin (LIPITOR) 10 MG tablet Take 1 tablet (10 mg total) by mouth daily at 6 PM. 12/27/16   Shanker Kristeen Mans, MD  carvedilol (COREG) 3.125 MG tablet Take 1 tablet (3.125 mg total) by mouth 2 (two) times daily with a meal. 12/27/16   Shanker Kristeen Mans, MD  clopidogrel (PLAVIX) 75 MG tablet Take 1 tablet (75 mg total) by mouth daily. 12/27/16   Shanker Kristeen Mans, MD  escitalopram (LEXAPRO) 10 MG tablet Take 1.5 tablets (15 mg total) by mouth daily. 12/27/16   Shanker Kristeen Mans, MD  famotidine (PEPCID) 20 MG tablet Take 1 tablet (20 mg total) by mouth daily. 12/27/16   Shanker Kristeen Mans, MD  glucose monitoring kit (FREESTYLE) monitoring kit 1 each by Does not apply route 4 (four) times daily - after meals and at bedtime. 1 month  Diabetic Testing Supplies for QAC-QHS accuchecks. 12/27/16   Shanker Kristeen Mans, MD  guaiFENesin (MUCINEX) 600 MG 12 hr tablet Take 1 tablet (600 mg total) by mouth 2 (two) times daily as needed for cough or to loosen phlegm. 12/27/16   Shanker Kristeen Mans, MD  Insulin Glargine (LANTUS) 100 UNIT/ML Solostar Pen Inject 4 Units into the skin daily at 10 pm. 12/27/16   Jonetta Osgood, MD  insulin lispro (HUMALOG JUNIOR KWIKPEN) 100 UNIT/ML KiwkPen Inject 0.01-0.05 mLs (1-5 Units total) into the skin 3 (three) times daily before meals. For CBGs < 150 = 0 units; 151 - 200 = 1 units, 201 - 250 = 2 units, 251 - 300 = 3 units; 301-350 = 4 units; 351-400 = 5 units; > 400 notify the provider.  DO NOT GIVE IF PATIENT NOT EATING MEA 12/27/16   Shanker Kristeen Mans, MD  Insulin Pen Needle 32G X 8 MM MISC Use as directed 12/27/16   Jonetta Osgood, MD  iron polysaccharides (NIFEREX) 150 MG capsule Take 1 capsule (150 mg total) by mouth 2 (two) times daily. 12/27/16   Shanker Kristeen Mans, MD  levothyroxine (SYNTHROID, LEVOTHROID) 112 MCG tablet Take 1 tablet (112 mcg total) by mouth daily before breakfast. 12/27/16   Jonetta Osgood, MD  meclizine (ANTIVERT) 25 MG tablet Take 1 tablet (25 mg total) by mouth 3 (three) times daily as needed for dizziness. 12/27/16   Shanker Kristeen Mans, MD  nystatin-triamcinolone ointment (MYCOLOG) Apply ointment every shift to perianal / buttocks for rash 12/27/16   Jonetta Osgood, MD  ondansetron (ZOFRAN) 4 MG tablet Take 1 tablet (4 mg total) by mouth every 8 (eight) hours as needed for nausea or vomiting. 12/29/16   Shanker Kristeen Mans, MD  traMADol (ULTRAM) 50 MG tablet Take 1 tablet (50 mg total) by mouth every 6 (six) hours as needed. 12/27/16   Shanker Kristeen Mans, MD    Physical Exam: Vitals:   01/04/17 1344 01/04/17 1345 01/04/17 1944  BP: (!) 182/100  (!) 158/90  Pulse: (!) 106  91  Resp: (!) 22  20  Temp: 99 F (37.2 C)    TempSrc: Oral    SpO2: 100%  100%  Weight:  51.7 kg (114  lb)   Height:  _0  (1.575 m)     GENERAL: 71 y.o.-year-old white female patient, well-developed, well-nourished lying in the bed in no acute distress.  Pleasant and cooperative.  HEENT: Head atraumatic, normocephalic. Pupils equal, round, reactive to light and accommodation. No scleral icterus. Extraocular muscles intact. Nares are patent. Oropharynx is clear. Mucus membranes dry.  NECK: Supple, full range of motion. No JVD, no bruit heard. No thyroid enlargement, no tenderness, no cervical lymphadenopathy. CHEST: Normal breath sounds bilaterally. No wheezing, rales, rhonchi or crackles. No use of accessory muscles of respiration.  No reproducible chest wall tenderness.  CARDIOVASCULAR: S1, S2 normal. No murmurs, rubs, or gallops. Cap refill <2 seconds. Pulses intact distally.  ABDOMEN: Soft, nondistended, mild tenderness diffusely. No rebound, guarding, rigidity. Normoactive bowel sounds present in all four quadrants. No organomegaly or mass. EXTREMITIES: No pedal edema, cyanosis, or clubbing. No calf tenderness or Homan's sign.  NEUROLOGIC: The patient is alert and oriented x 3. Cranial nerves II through XII are grossly intact with no focal sensorimotor deficit. Muscle strength 5/5 in all extremities. Sensation intact. Gait not checked. PSYCHIATRIC:  Normal affect, mood, thought content. SKIN: Warm, dry, and intact without obvious rash, lesion, or ulcer.    Labs on Admission:  CBC:  Recent Labs Lab 01/04/17 1348  WBC 12.2*  HGB 12.1  HCT 34.4*  MCV 88.7  PLT 981*   Basic Metabolic Panel:  Recent Labs Lab 01/04/17 1348  NA 138  K 3.3*  CL 106  CO2 19*  GLUCOSE 267*  BUN 17  CREATININE 1.27*  CALCIUM 8.6*   GFR: Estimated Creatinine Clearance: 33.1 mL/min (by C-G formula based on SCr of 1.27 mg/dL (H)). Liver Function Tests:  Recent Labs Lab 01/04/17 1348  AST 45*  ALT 24  ALKPHOS 131*  BILITOT 0.7  PROT 6.6  ALBUMIN 2.5*    Recent Labs Lab 01/04/17 1348   LIPASE 20   No results for input(s): AMMONIA in the last 168 hours. Coagulation Profile: No results for input(s): INR, PROTIME in the last 168 hours. Cardiac Enzymes: No results for input(s): CKTOTAL, CKMB, CKMBINDEX, TROPONINI in the last 168 hours. BNP (last 3 results) No results for input(s): PROBNP in the last 8760 hours. HbA1C: No results for input(s): HGBA1C in the last 72 hours. CBG:  Recent Labs Lab 12/28/16 2158 12/29/16 0814 12/29/16 1145  GLUCAP 206* 92 126*   Lipid Profile: No results for input(s): CHOL, HDL, LDLCALC, TRIG, CHOLHDL, LDLDIRECT in the last 72 hours. Thyroid Function Tests: No results for input(s): TSH, T4TOTAL, FREET4, T3FREE, THYROIDAB in the last 72 hours. Anemia Panel: No results for input(s): VITAMINB12, FOLATE, FERRITIN, TIBC, IRON, RETICCTPCT in the last 72 hours. Urine analysis:    Component Value Date/Time   COLORURINE YELLOW 12/23/2016 0807   APPEARANCEUR CLEAR 12/23/2016 0807   APPEARANCEUR Clear 06/15/2014 1508   LABSPEC 1.010 12/23/2016 0807   LABSPEC 1.010 06/15/2014 1508   PHURINE 7.0 12/23/2016 0807   GLUCOSEU 50 (A) 12/23/2016 0807   GLUCOSEU Negative 06/15/2014 1508   HGBUR NEGATIVE 12/23/2016 0807   BILIRUBINUR NEGATIVE 12/23/2016 0807   BILIRUBINUR Negative 06/15/2014 1508   KETONESUR NEGATIVE 12/23/2016 0807   PROTEINUR >=300 (A) 12/23/2016 0807   UROBILINOGEN 0.2 09/14/2015 1617   NITRITE NEGATIVE 12/23/2016 0807   LEUKOCYTESUR NEGATIVE 12/23/2016 0807   LEUKOCYTESUR Negative 06/15/2014 1508   Sepsis Labs: _0 (procalcitonin:4,lacticidven:4) )No results found for this or any previous visit (from the past 240 hour(s)).   Radiological Exams on Admission: Dg Chest 2 View  Result Date: 01/04/2017 CLINICAL DATA:  Recent pneumonia EXAM: CHEST  2 VIEW COMPARISON:  12/24/2016 FINDINGS: Cardiomediastinal silhouette is stable. There is small right pleural effusion  with right base posterior atelectasis. No infiltrate  or pulmonary edema. Mild degenerative changes thoracic spine. Mild degenerative changes bilateral shoulders. Mild thoracic dextroscoliosis. IMPRESSION: Small right pleural effusion with right base atelectasis. No infiltrate or pulmonary edema. Degenerative changes thoracic spine and bilateral shoulders. Electronically Signed   By: Lahoma Crocker M.D.   On: 01/04/2017 17:00   Ct Abdomen Pelvis W Contrast  Result Date: 01/04/2017 CLINICAL DATA:  Abdominal pain with no bowel movement for 3 days. History of CHF, chronic renal insufficiency, appendectomy, hysterectomy, and cholecystectomy. EXAM: CT ABDOMEN AND PELVIS WITH CONTRAST TECHNIQUE: Multidetector CT imaging of the abdomen and pelvis was performed using the standard protocol following bolus administration of intravenous contrast. CONTRAST:  1 ISOVUE-300 IOPAMIDOL (ISOVUE-300) INJECTION 61%, 75m ISOVUE-300 IOPAMIDOL (ISOVUE-300) INJECTION 61% COMPARISON:  September 18, 2016 FINDINGS: Lower chest: Bilateral pleural effusions with underlying atelectasis, right greater than left. No other abnormalities identified in the lung bases. Hepatobiliary: The patient is status post cholecystectomy with intra and extrahepatic biliary duct prominence/dilatation unchanged since October 2017. No acute abnormalities are seen in the liver. The portal veins are normal in appearance. Pancreas: The pancreas is normal in appearance. Spleen: Spleen is normal. Adrenals/Urinary Tract: The adrenal glands are normal. No renal stones, masses, or hydronephrosis. No ureterectasis or ureteral stones. The bladder is unremarkable. Stomach/Bowel: The stomach and small bowel are normal. Marked fecal loading is seen in the colon. There is a large stool ball in the rectum with mild wall thickening. It is difficult to assess the adjacent fat given ascites which will be described below. There is no pneumatosis. The fecal loading extends from the distal descending colon into the rectum. A short segment of  proximal ascending colon is difficult to evaluate due to poor distention. However, no abnormality was seen in this region in October 2017 and the finding is likely due to peristalsis. Vascular/Lymphatic: Atherosclerotic changes seen in the non aneurysmal aorta, iliac vessels, and femoral vessels. No adenopathy. Reproductive: The patient is status post hysterectomy. Other: Increased attenuation in the subcutaneous fat diffusely and in the mesenteries consistent with volume overload. There is moderate to severe ascites. Musculoskeletal: No acute or significant osseous findings. IMPRESSION: 1. Moderate bilateral pleural effusions with underlying atelectasis. Increased attenuation in the subcutaneous fat diffusely consistent with edema. Moderate to severe ascites. 2. Large stool ball in the rectum. There is mild associated wall thickening and the patient is at risk for stercoral colitis. Fecal loading extending from the distal descending colon into the rectum. 3. Atherosclerosis in the abdominal aorta. Electronically Signed   By: DDorise BullionIII M.D   On: 01/04/2017 18:08   Dg Abd 2 Views  Result Date: 01/04/2017 CLINICAL DATA:  Generalized abdominal pain.  No bowel movement. EXAM: ABDOMEN - 2 VIEW COMPARISON:  None. FINDINGS: Mild opacity is not excluded in the right base. Left lung base is normal. No free air, portal venous gas, or pneumatosis. No renal or ureteral stones. Cholecystectomy clips. Fecal loading in the rectum. Nonobstructive bowel gas pattern. IMPRESSION: 1. Mild infiltrate is not excluded in the right lung base. Recommend a PA and lateral chest x-ray for better evaluation. 2. Fecal loading in the rectum.  No other abdominal abnormalities. Electronically Signed   By: DDorise BullionIII M.D   On: 01/04/2017 15:39    Assessment/Plan Active Problems:   Fecal impaction ([facility]    This is a 71y.o. female with a history of DM, CKD3, HTN, hypothyroid, DM1 now being admitted with: 1. Fecal  impaction at risk for stercoral colitis - Admit for pain control, enemas, gentle IV fluid hydration. GI and surgical consults secondary to concern for stercoral colitis, ?ischemic bowel.  Check lactate.   2. Hypokalemia  - Replace IV 3. H/O DM  - RISS, continue Lantus 4. History of hypertension -Continue Norvasc, Coreg 5. History of hyperlipidemia -Continue Lipitor 6. History of hypothyroidism -Continue Synthroid 7. History of GERD -Continue Pepcid 8. History of depression -Continue Lexapro  Admission status: Inpatient IV Fluids: normal saline Diet/Nutrition: clear liquid Consults called: GI and surgery  DVT Px: Lovenox, SCDs and early ambulation. Code Status: Full Code  Disposition Plan: TBD   All the records are reviewed and case discussed with ED provider. Management plans discussed with the patient and/or family who express understanding and agree with plan of care.  Julian Medina D.O. on 01/04/2017 at 9:26 PM Between 7am to 6pm - Pager - [phone] After 6pm go to [email or web address] - [employer] [facility] [place] [facility] Office [phone] CC: Primary care physician; Cammy Copa, MD   01/04/2017, 9:26 PM

## 2017-01-04 NOTE — ED Provider Notes (Signed)
Discussed CT with patient. Disimpaction performed by me   ------------------------------------------------------------------------------------------------------------------- Fecal Disimpaction Procedure Note:  Performed by me:  Patient placed in the lateral recumbent position with knees drawn towards chest. Nurse present for patient support. Large amount of soft dar stool removed. No complications during procedure.   ------------------------------------------------------------------------------------------------------------------     Jene Everyobert Shonya Sumida, MD 01/04/17 1921

## 2017-01-04 NOTE — ED Notes (Signed)
Report called to floor, given to Robin.

## 2017-01-04 NOTE — ED Triage Notes (Signed)
Pt presents via EMS c/o abd pain. No BM x3 days. EMS reports nonactive bowel sounds.

## 2017-01-04 NOTE — ED Provider Notes (Signed)
[facility] Emergency Department Provider Note  ____________________________________________  Time seen: Approximately 2:48 PM  I have reviewed the triage vital signs and the nursing notes.   HISTORY  Chief Complaint Abdominal Pain   HPI Cassandra [name] is a 71 y.o. female history of CHF, COPD, CK D who presents for evaluation of abdominal pain and constipation. Patient has had 3 days of diarrhea and now 2 days of constipation. Has received one enema at home yesterday evening by her daughter with no resolution of her constipation. Patient today started complaining of severe diffuse abdominal pain has been constant since this morning. She has had 1 episode of nonbloody nonbilious emesis. She reports that she is passing flatus but less than normal. No prior history of SBO. Patient has had a patient has had prior abdominal surgeries including hysterectomy and appendectomy and hernia repair many years ago. She hasn't taken anything at home for the pain. No fever or chills, no dysuria hematuria, no chest pain or shortness of breath.  Past Medical History:  Diagnosis Date  . Allergic rhinitis   . Anemia   . Anxiety   . Arthritis    "mostly feet, hands" (10/17/2016)  . Benign hypertension with CKD (chronic kidney disease) stage III   . Benign paroxysmal positional vertigo   . Bipolar disorder ([place])   . Broken finger   . Broken shoulder   . Broken toes   . Cervicalgia   . CHF (congestive heart failure) ([place]) 07/2016  . CKD (chronic kidney disease) stage 3, GFR 30-59 ml/min   . COPD (chronic obstructive pulmonary disease) ([place])   . Depression   . Elevated liver enzymes Hep B/C neg 2014  . Fall at nursing home 10/15/2016  . GERD (gastroesophageal reflux disease)   . Glaucoma   . High cholesterol   . History of alcohol use   . History of blood transfusion 09/2016   "blood got really really low"  . Hypertension   . Hypothyroidism   . Interstitial cystitis     bladder stretched every 9 months  . Migraines    "pretty much qd" (10/17/2016)  . Pneumonia 12/2016  . Psoriasis   . Stroke ([place]) 05/16/2016   Left occipital and thalamic, right hippocampal  . Thyroid disease   . Tobacco use   . 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 Vitamin B12 deficiency     Patient Active Problem List   Diagnosis Date Noted  . HAP (hospital-acquired pneumonia) 12/23/2016  . HCAP (healthcare-associated pneumonia)   . Diabetes mellitus with complication ([place])   . Hypoglycemia 12/03/2016  . Palliative care encounter   . Goals of care, counseling/discussion   . Cervical vertebral fracture ([place]) 10/17/2016  . Thoracic vertebral fracture ([place]) 10/17/2016  . Fall at nursing home 10/17/2016  . Common bile duct dilatation 10/17/2016  . Closed fracture of cervical vertebra ([place])   . Recent Gram-negative bacteremia 10/07/2016  . Gastroesophageal reflux disease   . DKA, type 1 ([place]) 09/06/2016  . Protein-calorie malnutrition, severe 08/28/2016  . Chronic diastolic heart failure ([place]) 07/19/2016  . Dehydration   . Cerebral embolism with cerebral infarction 07/13/2016  . Left ventricular diastolic dysfunction, NYHA class 1 07/11/2016  . History of CVA (cerebrovascular accident)   . HLD (hyperlipidemia)   . Anemia of chronic disease   . Physical deconditioning   . Acute on chronic kidney failure ([place])   . Diabetic ketoacidosis with coma associated with type 1  diabetes mellitus (H[facility])   . Cerebral thrombosis with cerebral infarction 05/15/2016  . Generalized anxiety disorder 05/10/2016  . Diabetic ketoacidosis without coma associated with type 1 diabetes mellitus (H[facility]) 05/06/2016  . Insulin dependent type 2 diabetes mellitus, uncontrolled (H[facility])   . Depression   . CKD (chronic kidney disease) stage 3, GFR 30-59 ml/min   . Uncontrolled Hypothyroidism   . Vitamin B12 deficiency   . Benign paroxysmal positional vertigo   . Anxiety   . Allergic  rhinitis   . Glaucoma   . Benign hypertension with CKD (chronic kidney disease) stage III   . Tobacco abuse   . Cervicalgia   . Elevated liver enzymes   . History of alcohol use     Past Surgical History:  Procedure Laterality Date  . ANTERIOR CERVICAL DECOMP/DISCECTOMY FUSION  ~ 2009  . APPENDECTOMY    . BACK SURGERY    . BLADDER SUSPENSION    . CATARACT EXTRACTION W/ INTRAOCULAR LENS  IMPLANT, BILATERAL Bilateral   . DILATION AND CURETTAGE OF UTERUS    . HERNIA REPAIR    . LAPAROSCOPIC CHOLECYSTECTOMY    . TONSILLECTOMY    . TUBAL LIGATION    . VAGINAL HYSTERECTOMY     with oophorectomy    Prior to Admission medications   Medication Sig Start Date End Date Taking? Authorizing Provider  acetaminophen (TYLENOL) 325 MG tablet Take 650 mg by mouth every 6 (six) hours as needed for mild pain or fever.     Historical Provider, MD  Amino Acids-Protein Hydrolys (FEEDING SUPPLEMENT, PRO-STAT SUGAR FREE 64,) LIQD Take 30 mLs by mouth 2 (two) times daily. 12/27/16   Shanker Levora Dredge, MD  amLODipine (NORVASC) 5 MG tablet Take 1 tablet (5 mg total) by mouth daily. 12/27/16   Shanker Levora Dredge, MD  aspirin 81 MG chewable tablet Chew 1 tablet (81 mg total) by mouth daily. 12/27/16   Shanker Levora Dredge, MD  atorvastatin (LIPITOR) 10 MG tablet Take 1 tablet (10 mg total) by mouth daily at 6 PM. 12/27/16   Shanker Levora Dredge, MD  carvedilol (COREG) 3.125 MG tablet Take 1 tablet (3.125 mg total) by mouth 2 (two) times daily with a meal. 12/27/16   Shanker Levora Dredge, MD  clopidogrel (PLAVIX) 75 MG tablet Take 1 tablet (75 mg total) by mouth daily. 12/27/16   Shanker Levora Dredge, MD  escitalopram (LEXAPRO) 10 MG tablet Take 1.5 tablets (15 mg total) by mouth daily. 12/27/16   Shanker Levora Dredge, MD  famotidine (PEPCID) 20 MG tablet Take 1 tablet (20 mg total) by mouth daily. 12/27/16   Shanker Levora Dredge, MD  glucose monitoring kit (FREESTYLE) monitoring kit 1 each by Does not apply route 4 (four) times daily -  after meals and at bedtime. 1 month Diabetic Testing Supplies for QAC-QHS accuchecks. 12/27/16   Shanker Levora Dredge, MD  guaiFENesin (MUCINEX) 600 MG 12 hr tablet Take 1 tablet (600 mg total) by mouth 2 (two) times daily as needed for cough or to loosen phlegm. 12/27/16   Shanker Levora Dredge, MD  Insulin Glargine (LANTUS) 100 UNIT/ML Solostar Pen Inject 4 Units into the skin daily at 10 pm. 12/27/16   Maretta Bees, MD  insulin lispro (HUMALOG JUNIOR KWIKPEN) 100 UNIT/ML KiwkPen Inject 0.01-0.05 mLs (1-5 Units total) into the skin 3 (three) times daily before meals. For CBGs < 150 = 0 units; 151 - 200 = 1 units, 201 - 250 = 2 units, 251 - 300 = 3  units; 301-350 = 4 units; 351-400 = 5 units; > 400 notify the provider.  DO NOT GIVE IF PATIENT NOT EATING MEA 12/27/16   Shanker Kristeen Mans, MD  Insulin Pen Needle 32G X 8 MM MISC Use as directed 12/27/16   Jonetta Osgood, MD  iron polysaccharides (NIFEREX) 150 MG capsule Take 1 capsule (150 mg total) by mouth 2 (two) times daily. 12/27/16   Shanker Kristeen Mans, MD  levothyroxine (SYNTHROID, LEVOTHROID) 112 MCG tablet Take 1 tablet (112 mcg total) by mouth daily before breakfast. 12/27/16   Jonetta Osgood, MD  meclizine (ANTIVERT) 25 MG tablet Take 1 tablet (25 mg total) by mouth 3 (three) times daily as needed for dizziness. 12/27/16   Shanker Kristeen Mans, MD  nystatin-triamcinolone ointment (MYCOLOG) Apply ointment every shift to perianal / buttocks for rash 12/27/16   Jonetta Osgood, MD  ondansetron (ZOFRAN) 4 MG tablet Take 1 tablet (4 mg total) by mouth every 8 (eight) hours as needed for nausea or vomiting. 12/29/16   Shanker Kristeen Mans, MD  traMADol (ULTRAM) 50 MG tablet Take 1 tablet (50 mg total) by mouth every 6 (six) hours as needed. 12/27/16   Shanker Kristeen Mans, MD    Allergies Alprazolam; Percocet [oxycodone-acetaminophen]; Codeine; Doxycycline; Hydrocodone; Omnicef [cefdinir]; Augmentin [amoxicillin-pot clavulanate]; and Ciprofloxacin  Family  History  Problem Relation Age of Onset  . Alcohol abuse Mother   . Arthritis Mother   . Asthma Mother   . Cancer Mother     colon cancer  . Hypertension Mother   . Migraines Mother   . Stroke Mother   . Lung disease Mother   . COPD Mother   . Diabetes Father   . Hypertension Father   . Heart disease Father   . Heart attack Father   . Heart disease Paternal Grandmother   . Diabetes Paternal Grandmother   . Stroke Paternal Grandmother   . Cancer Paternal Grandmother   . Diabetes Paternal Grandfather     Social History Social History  Substance Use Topics  . Smoking status: Former Smoker    Packs/day: 0.25    Years: 10.00    Types: Cigarettes    Quit date: 12/1923  . Smokeless tobacco: Never Used     Comment: QUIT SMOKING 11/2016  . Alcohol use No     Comment: Hasn't had any alcohol "for 2 yrs" (10/17/2016)    Review of Systems  Constitutional: Negative for fever. Eyes: Negative for visual changes. ENT: Negative for sore throat. Neck: No neck pain  Cardiovascular: Negative for chest pain. Respiratory: Negative for shortness of breath. Gastrointestinal: + diffuse abdominal pain, vomiting and constipation. Genitourinary: Negative for dysuria. Musculoskeletal: Negative for back pain. Skin: Negative for rash. Neurological: Negative for headaches, weakness or numbness. Psych: No SI or HI  ____________________________________________   PHYSICAL EXAM:  VITAL SIGNS: ED Triage Vitals  Enc Vitals Group     BP 01/04/17 1344 (!) 182/100     Pulse Rate 01/04/17 1344 (!) 106     Resp 01/04/17 1344 (!) 22     Temp 01/04/17 1344 99 F (37.2 C)     Temp Source 01/04/17 1344 Oral     SpO2 01/04/17 1344 100 %     Weight 01/04/17 1345 114 lb (51.7 kg)     Height 01/04/17 1345 '5\' 2"'$  (1.575 m)     Head Circumference --      Peak Flow --      Pain Score 01/04/17 1345 10  Pain Loc --      Pain Edu? --      Excl. in GC? --     Constitutional: Alert and oriented.  Well appearing and in distress due to pain HEENT:      Head: Normocephalic and atraumatic.         Eyes: Conjunctivae are normal. Sclera is non-icteric. EOMI. PERRL      Mouth/Throat: Mucous membranes are moist.       Neck: Supple with no signs of meningismus. Cardiovascular: Tachycardic with regular rhythm. No murmurs, gallops, or rubs. 2+ symmetrical distal pulses are present in all extremities. No JVD. Respiratory: Normal respiratory effort. Lungs are clear to auscultation bilaterally. No wheezes, crackles, or rhonchi.  Gastrointestinal: Soft, Diffusely tender to palpation worse on the right side, and non distended with positive bowel sounds. No rebound or guarding. Genitourinary: No CVA tenderness.Rectal exam showing soft stool in the vault that was disimpacted Musculoskeletal: Nontender with normal range of motion in all extremities. No edema, cyanosis, or erythema of extremities. Neurologic: Normal speech and language. Face is symmetric. Moving all extremities. No gross focal neurologic deficits are appreciated. Skin: Skin is warm, dry and intact. No rash noted. Psychiatric: Mood and affect are normal. Speech and behavior are normal.  ____________________________________________   LABS (all labs ordered are listed, but only abnormal results are displayed)  Labs Reviewed  COMPREHENSIVE METABOLIC PANEL - Abnormal; Notable for the following:       Result Value   Potassium 3.3 (*)    CO2 19 (*)    Glucose, Bld 267 (*)    Creatinine, Ser 1.27 (*)    Calcium 8.6 (*)    Albumin 2.5 (*)    AST 45 (*)    Alkaline Phosphatase 131 (*)    GFR calc non Af Amer 42 (*)    GFR calc Af Amer 49 (*)    All other components within normal limits  CBC - Abnormal; Notable for the following:    WBC 12.2 (*)    HCT 34.4 (*)    Platelets 485 (*)    All other components within normal limits  LIPASE, BLOOD  URINALYSIS, COMPLETE (UACMP) WITH MICROSCOPIC    ____________________________________________  EKG  none ____________________________________________  RADIOLOGY  KUB:   CLINICAL DATA: Generalized abdominal pain. No bowel movement.  EXAM: ABDOMEN - 2 VIEW  COMPARISON: None.  FINDINGS: Mild opacity is not excluded in the right base. Left lung base is normal. No free air, portal venous gas, or pneumatosis. No renal or ureteral stones. Cholecystectomy clips. Fecal loading in the rectum. Nonobstructive bowel gas pattern.  IMPRESSION: 1. Mild infiltrate is not excluded in the right lung base. Recommend a PA and lateral chest x-ray for better evaluation. 2. Fecal loading in the rectum. No other abdominal abnormalities.       CT a/p: PND  ____________________________________________   PROCEDURES  Procedure(s) performed:yes Procedures   Disimpaction of soft stool ball in the rectum with improvement of pain. Patient tolerated well   Critical Care performed:  None ____________________________________________   INITIAL IMPRESSION / ASSESSMENT AND PLAN / ED COURSE   71 y.o. female history of CHF, COPD, CK D who presents for evaluation of abdominal pain and constipation x 2 days. Patient now nonbloody nonbilious emesis and also reduce flatus. Patient is significantly tender to palpation on the right side of her abdomen with no rebound or guarding. Patient was disimpacted with minimal improvement of the pain. Blood work showing mildly elevated leukocytosis of  12, low CO2 of 19, baseline creatinine. KUB concerning for infiltrate. Chest x-ray has been ordered. CT abdomen and pelvis pending. Patient has received IV fluids, IV morphine, IV Zofran. If CT is negative for obstruction or any other intra-abdominal process will pursue with an enema. Care transferred to Dr. Corky Downs.      Pertinent labs & imaging results that were available during my care of the patient were reviewed by me and considered in my medical decision  making (see chart for details).    ____________________________________________   FINAL CLINICAL IMPRESSION(S) / ED DIAGNOSES  Final diagnoses:  Constipation  Generalized abdominal pain      NEW MEDICATIONS STARTED DURING THIS VISIT:  New Prescriptions   No medications on file     Note:  This document was prepared using Dragon voice recognition software and may include unintentional dictation errors.    Rudene Re, MD 01/05/17 1622

## 2017-01-04 NOTE — Consult Note (Signed)
Surgical Consultation  01/04/2017  Cassandra [name] is an 71 y.o. female.   CC:abd pain  HPI: This a patient who is moving the hospital with a diagnosis of fecal impaction. I was asked see the patient for fecal impaction but when I spoke to the patient I asked her what brought her to the hospital and it was abdominal pain that started this morning at 7:30 AM. Patient states she's had this pain before something similar to it but this time it required pain medication. Patient states that it was cramping in nature. She feels better now after pain medication and has had no nausea vomiting fevers or chills. Her last bowel movement was 2 days ago but required disimpaction twice in the emergency room today.  She states that her stool has been dark but did not notice any blood.  She has a history of stroke and is on Plavix and has multiple other medical problems including diabetes.  (After seeing the patient I had the opportunity to talk to Dr. Corky Downs in the emergency room and had performed a second disimpaction. He stated that he saw no evidence for ischemic bowel in this patient and the emergency team had no plans for calling surgery.)  Family is present and assisted with history. Patient was apparently recently at [facility] with symptoms of weakness etc.  Past Medical History:  Diagnosis Date  . Allergic rhinitis   . Anemia   . Anxiety   . Arthritis    "mostly feet, hands" (10/17/2016)  . Benign hypertension with CKD (chronic kidney disease) stage III   . Benign paroxysmal positional vertigo   . Bipolar disorder ([place])   . Broken finger   . Broken shoulder   . Broken toes   . Cervicalgia   . CHF (congestive heart failure) ([place]) 07/2016  . CKD (chronic kidney disease) stage 3, GFR 30-59 ml/min   . COPD (chronic obstructive pulmonary disease) ([place])   . Depression   . Elevated liver enzymes Hep B/C neg 2014  . Fall at nursing home 10/15/2016  . GERD (gastroesophageal reflux disease)    . Glaucoma   . High cholesterol   . History of alcohol use   . History of blood transfusion 09/2016   "blood got really really low"  . Hypertension   . Hypothyroidism   . Interstitial cystitis    bladder stretched every 9 months  . Migraines    "pretty much qd" (10/17/2016)  . Pneumonia 12/2016  . Psoriasis   . Stroke ([place]) 05/16/2016   Left occipital and thalamic, right hippocampal  . Thyroid disease   . Tobacco use   . 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)   . Vitamin B12 deficiency     Past Surgical History:  Procedure Laterality Date  . ANTERIOR CERVICAL DECOMP/DISCECTOMY FUSION  ~ 2009  . APPENDECTOMY    . BACK SURGERY    . BLADDER SUSPENSION    . CATARACT EXTRACTION W/ INTRAOCULAR LENS  IMPLANT, BILATERAL Bilateral   . DILATION AND CURETTAGE OF UTERUS    . HERNIA REPAIR    . LAPAROSCOPIC CHOLECYSTECTOMY    . TONSILLECTOMY    . TUBAL LIGATION    . VAGINAL HYSTERECTOMY     with oophorectomy    Family History  Problem Relation Age of Onset  . Alcohol abuse Mother   . Arthritis Mother   . Asthma Mother   . Cancer Mother     colon cancer  . Hypertension Mother   .  Migraines Mother   . Stroke Mother   . Lung disease Mother   . COPD Mother   . Diabetes Father   . Hypertension Father   . Heart disease Father   . Heart attack Father   . Heart disease Paternal Grandmother   . Diabetes Paternal Grandmother   . Stroke Paternal Grandmother   . Cancer Paternal Grandmother   . Diabetes Paternal Grandfather     Social History:  reports that she quit smoking about 93 years ago. Her smoking use included Cigarettes. She has a 2.50 pack-year smoking history. She has never used smokeless tobacco. She reports that she does not drink alcohol or use drugs.  Allergies:  Allergies  Allergen Reactions  . Alprazolam Other (See Comments)    Family preference, for patient to not take med  . Percocet [Oxycodone-Acetaminophen] Other (See Comments)    Family  preference, for patient to not take med  . Codeine Diarrhea and Nausea And Vomiting  . Doxycycline Diarrhea and Nausea And Vomiting  . Hydrocodone Nausea And Vomiting  . Omnicef [Cefdinir] Nausea Only and Other (See Comments)    Constipation, tolerated Zosyn  . Tramadol Other (See Comments)    Pt hallucinates when taking medication. Family request not to give medication to pt   . Augmentin [Amoxicillin-Pot Clavulanate] Hives and Rash    Has patient had a PCN reaction causing immediate rash, facial/tongue/throat swelling, SOB or lightheadedness with hypotension: Yes Has patient had a PCN reaction causing severe rash involving mucus membranes or skin necrosis: Yes Did a PCN reaction that required hospitalization No Did PCN reaction occurring within the last 10 years: Yes If all of the above answers are "NO", then may proceed with Cephalosporin use.  Pt states she has taken penicillin since, and was ok with it...  . Ciprofloxacin Hives    Tolerated LVQ in 12/2016    Medications reviewed.   Review of Systems:   Review of Systems  Constitutional: Negative for chills and fever.  HENT: Negative.   Eyes: Negative.   Respiratory: Negative.   Cardiovascular: Negative for chest pain and palpitations.  Gastrointestinal: Positive for abdominal pain, constipation and melena. Negative for blood in stool, diarrhea, heartburn, nausea and vomiting.  Genitourinary: Negative.   Musculoskeletal: Negative.   Skin: Negative.   Neurological: Negative.   Endo/Heme/Allergies: Negative.   Psychiatric/Behavioral: Negative.      Physical Exam:  BP (!) 179/83 (BP Location: Right Arm)   Pulse 86   Temp 98.6 F (37 C) (Oral)   Resp 16   Ht '5\' 2"'$  (1.575 m)   Wt 114 lb (51.7 kg)   SpO2 100%   BMI 20.85 kg/m   Physical Exam  Constitutional: She is oriented to person, place, and time and well-developed, well-nourished, and in no distress. No distress.  Patient appears quite comfortable. She moves  about the bed without difficulty.  HENT:  Head: Normocephalic and atraumatic.  Eyes: Pupils are equal, round, and reactive to light. Right eye exhibits no discharge. Left eye exhibits no discharge. No scleral icterus.  Neck: Normal range of motion. Neck supple.  Cardiovascular: Normal rate and regular rhythm.   Pulmonary/Chest: Effort normal. No respiratory distress.  Abdominal: Soft. She exhibits no distension. There is no tenderness. There is no rebound and no guarding.  Abdomen is nondistended nontympanitic and essentially nontender. There is no guarding rebound or percussion tenderness  Musculoskeletal: Normal range of motion. She exhibits edema. She exhibits no tenderness.  Lymphadenopathy:    She  has no cervical adenopathy.  Neurological: She is alert and oriented to person, place, and time.  Skin: Skin is warm and dry. No rash noted. She is not diaphoretic. No erythema.  Psychiatric: Affect normal.  Vitals reviewed.     Results for orders placed or performed during the hospital encounter of 01/04/17 (from the past 48 hour(s))  Lipase, blood     Status: None   Collection Time: 01/04/17  1:48 PM  Result Value Ref Range   Lipase 20 11 - 51 U/L  Comprehensive metabolic panel     Status: Abnormal   Collection Time: 01/04/17  1:48 PM  Result Value Ref Range   Sodium 138 135 - 145 mmol/L   Potassium 3.3 (L) 3.5 - 5.1 mmol/L   Chloride 106 101 - 111 mmol/L   CO2 19 (L) 22 - 32 mmol/L   Glucose, Bld 267 (H) 65 - 99 mg/dL   BUN 17 6 - 20 mg/dL   Creatinine, Ser 1.27 (H) 0.44 - 1.00 mg/dL   Calcium 8.6 (L) 8.9 - 10.3 mg/dL   Total Protein 6.6 6.5 - 8.1 g/dL   Albumin 2.5 (L) 3.5 - 5.0 g/dL   AST 45 (H) 15 - 41 U/L   ALT 24 14 - 54 U/L   Alkaline Phosphatase 131 (H) 38 - 126 U/L   Total Bilirubin 0.7 0.3 - 1.2 mg/dL   GFR calc non Af Amer 42 (L) >60 mL/min   GFR calc Af Amer 49 (L) >60 mL/min    Comment: (NOTE) The eGFR has been calculated using the CKD EPI equation. This  calculation has not been validated in all clinical situations. eGFR's persistently <60 mL/min signify possible Chronic Kidney Disease.    Anion gap 13 5 - 15  CBC     Status: Abnormal   Collection Time: 01/04/17  1:48 PM  Result Value Ref Range   WBC 12.2 (H) 3.6 - 11.0 K/uL   RBC 3.88 3.80 - 5.20 MIL/uL   Hemoglobin 12.1 12.0 - 16.0 g/dL   HCT 34.4 (L) 35.0 - 47.0 %   MCV 88.7 80.0 - 100.0 fL   MCH 31.2 26.0 - 34.0 pg   MCHC 35.1 32.0 - 36.0 g/dL   RDW 14.4 11.5 - 14.5 %   Platelets 485 (H) 150 - 440 K/uL  Glucose, capillary     Status: Abnormal   Collection Time: 01/04/17 10:07 PM  Result Value Ref Range   Glucose-Capillary 179 (H) 65 - 99 mg/dL  Glucose, capillary     Status: Abnormal   Collection Time: 01/04/17 11:00 PM  Result Value Ref Range   Glucose-Capillary 169 (H) 65 - 99 mg/dL   Dg Chest 2 View  Result Date: 01/04/2017 CLINICAL DATA:  Recent pneumonia EXAM: CHEST  2 VIEW COMPARISON:  12/24/2016 FINDINGS: Cardiomediastinal silhouette is stable. There is small right pleural effusion with right base posterior atelectasis. No infiltrate or pulmonary edema. Mild degenerative changes thoracic spine. Mild degenerative changes bilateral shoulders. Mild thoracic dextroscoliosis. IMPRESSION: Small right pleural effusion with right base atelectasis. No infiltrate or pulmonary edema. Degenerative changes thoracic spine and bilateral shoulders. Electronically Signed   By: Lahoma Crocker M.D.   On: 01/04/2017 17:00   Ct Abdomen Pelvis W Contrast  Result Date: 01/04/2017 CLINICAL DATA:  Abdominal pain with no bowel movement for 3 days. History of CHF, chronic renal insufficiency, appendectomy, hysterectomy, and cholecystectomy. EXAM: CT ABDOMEN AND PELVIS WITH CONTRAST TECHNIQUE: Multidetector CT imaging of the abdomen and pelvis  was performed using the standard protocol following bolus administration of intravenous contrast. CONTRAST:  1 ISOVUE-300 IOPAMIDOL (ISOVUE-300) INJECTION 61%, 42mL  ISOVUE-300 IOPAMIDOL (ISOVUE-300) INJECTION 61% COMPARISON:  September 18, 2016 FINDINGS: Lower chest: Bilateral pleural effusions with underlying atelectasis, right greater than left. No other abnormalities identified in the lung bases. Hepatobiliary: The patient is status post cholecystectomy with intra and extrahepatic biliary duct prominence/dilatation unchanged since October 2017. No acute abnormalities are seen in the liver. The portal veins are normal in appearance. Pancreas: The pancreas is normal in appearance. Spleen: Spleen is normal. Adrenals/Urinary Tract: The adrenal glands are normal. No renal stones, masses, or hydronephrosis. No ureterectasis or ureteral stones. The bladder is unremarkable. Stomach/Bowel: The stomach and small bowel are normal. Marked fecal loading is seen in the colon. There is a large stool ball in the rectum with mild wall thickening. It is difficult to assess the adjacent fat given ascites which will be described below. There is no pneumatosis. The fecal loading extends from the distal descending colon into the rectum. A short segment of proximal ascending colon is difficult to evaluate due to poor distention. However, no abnormality was seen in this region in October 2017 and the finding is likely due to peristalsis. Vascular/Lymphatic: Atherosclerotic changes seen in the non aneurysmal aorta, iliac vessels, and femoral vessels. No adenopathy. Reproductive: The patient is status post hysterectomy. Other: Increased attenuation in the subcutaneous fat diffusely and in the mesenteries consistent with volume overload. There is moderate to severe ascites. Musculoskeletal: No acute or significant osseous findings. IMPRESSION: 1. Moderate bilateral pleural effusions with underlying atelectasis. Increased attenuation in the subcutaneous fat diffusely consistent with edema. Moderate to severe ascites. 2. Large stool ball in the rectum. There is mild associated wall thickening and the  patient is at risk for stercoral colitis. Fecal loading extending from the distal descending colon into the rectum. 3. Atherosclerosis in the abdominal aorta. Electronically Signed   By: Gerome Sam III M.D   On: 01/04/2017 18:08   Dg Abd 2 Views  Result Date: 01/04/2017 CLINICAL DATA:  Generalized abdominal pain.  No bowel movement. EXAM: ABDOMEN - 2 VIEW COMPARISON:  None. FINDINGS: Mild opacity is not excluded in the right base. Left lung base is normal. No free air, portal venous gas, or pneumatosis. No renal or ureteral stones. Cholecystectomy clips. Fecal loading in the rectum. Nonobstructive bowel gas pattern. IMPRESSION: 1. Mild infiltrate is not excluded in the right lung base. Recommend a PA and lateral chest x-ray for better evaluation. 2. Fecal loading in the rectum.  No other abdominal abnormalities. Electronically Signed   By: Gerome Sam III M.D   On: 01/04/2017 15:39    Assessment/Plan:  This patient was asked see for fecal impaction but on questioning the patient she actually has abdominal pain that started this morning at 7:30 it is improved with pain medication and was cramping in nature. A workup showed fecal impaction both on KUB and CT scan which should been personally reviewed by me.  Her white blood cell count is been elevated and her lactate level as well as bicarbonate levels are abnormal as well I will repeat those to ensure that there is no sign of progressive ischemic bowel. I see no evidence to suggest that this is ischemic bowel other than that and abnormal laboratory values in a patient on anticoagulation for a prior stroke. The suspicion for ischemic bowel is low but needs to be monitored.  Proximally 30 minutes are spent in direct  patient contact with this patient and family.  Florene Glen, MD, FACS

## 2017-01-04 NOTE — ED Notes (Signed)
Patient transported to radiology

## 2017-01-04 NOTE — Care Management Obs Status (Signed)
MEDICARE OBSERVATION STATUS NOTIFICATION   Patient Details  Name: Cassandra AbbeBillie A [place] MRN: [id number] Date of Birth: 1946-11-05   Medicare Observation Status Notification Given:  Yes    CrutchfieldDerrill Memo, Rudie Rikard M, RN 01/04/2017, 10:04 PM

## 2017-01-05 DIAGNOSIS — R1084 Generalized abdominal pain: Secondary | ICD-10-CM | POA: Diagnosis not present

## 2017-01-05 DIAGNOSIS — K5900 Constipation, unspecified: Secondary | ICD-10-CM

## 2017-01-05 DIAGNOSIS — K5641 Fecal impaction: Secondary | ICD-10-CM

## 2017-01-05 DIAGNOSIS — R109 Unspecified abdominal pain: Secondary | ICD-10-CM

## 2017-01-05 LAB — IRON AND TIBC
IRON: 39 ug/dL (ref 28–170)
Saturation Ratios: 22 % (ref 10.4–31.8)
TIBC: 182 ug/dL — ABNORMAL LOW (ref 250–450)
UIBC: 143 ug/dL

## 2017-01-05 LAB — GLUCOSE, CAPILLARY
GLUCOSE-CAPILLARY: 225 mg/dL — AB (ref 65–99)
Glucose-Capillary: 103 mg/dL — ABNORMAL HIGH (ref 65–99)
Glucose-Capillary: 189 mg/dL — ABNORMAL HIGH (ref 65–99)
Glucose-Capillary: 221 mg/dL — ABNORMAL HIGH (ref 65–99)
Glucose-Capillary: 66 mg/dL (ref 65–99)
Glucose-Capillary: 91 mg/dL (ref 65–99)

## 2017-01-05 LAB — BASIC METABOLIC PANEL
Anion gap: 5 (ref 5–15)
BUN: 18 mg/dL (ref 6–20)
CHLORIDE: 110 mmol/L (ref 101–111)
CO2: 24 mmol/L (ref 22–32)
Calcium: 7.2 mg/dL — ABNORMAL LOW (ref 8.9–10.3)
Creatinine, Ser: 1.26 mg/dL — ABNORMAL HIGH (ref 0.44–1.00)
GFR calc non Af Amer: 42 mL/min — ABNORMAL LOW (ref >60)
GFR, EST AFRICAN AMERICAN: 49 mL/min — AB (ref >60)
Glucose, Bld: 183 mg/dL — ABNORMAL HIGH (ref 65–99)
POTASSIUM: 3 mmol/L — AB (ref 3.5–5.1)
SODIUM: 139 mmol/L (ref 135–145)

## 2017-01-05 LAB — FERRITIN: Ferritin: 231 ng/mL (ref 11–307)

## 2017-01-05 LAB — BASIC METABOLIC PANEL WITH GFR
Anion gap: 6 (ref 5–15)
BUN: 17 mg/dL (ref 6–20)
CO2: 23 mmol/L (ref 22–32)
Calcium: 7.1 mg/dL — ABNORMAL LOW (ref 8.9–10.3)
Chloride: 111 mmol/L (ref 101–111)
Creatinine, Ser: 1.28 mg/dL — ABNORMAL HIGH (ref 0.44–1.00)
GFR calc Af Amer: 48 mL/min — ABNORMAL LOW
GFR calc non Af Amer: 42 mL/min — ABNORMAL LOW
Glucose, Bld: 178 mg/dL — ABNORMAL HIGH (ref 65–99)
Potassium: 3 mmol/L — ABNORMAL LOW (ref 3.5–5.1)
Sodium: 140 mmol/L (ref 135–145)

## 2017-01-05 LAB — CBC
HCT: 22.1 % — ABNORMAL LOW (ref 35.0–47.0)
HEMOGLOBIN: 7.8 g/dL — AB (ref 12.0–16.0)
MCH: 31.3 pg (ref 26.0–34.0)
MCHC: 35.2 g/dL (ref 32.0–36.0)
MCV: 88.9 fL (ref 80.0–100.0)
Platelets: 332 10*3/uL (ref 150–440)
RBC: 2.49 MIL/uL — AB (ref 3.80–5.20)
RDW: 13.9 % (ref 11.5–14.5)
WBC: 9.3 10*3/uL (ref 3.6–11.0)

## 2017-01-05 LAB — LACTIC ACID, PLASMA
LACTIC ACID, VENOUS: 0.5 mmol/L (ref 0.5–1.9)
LACTIC ACID, VENOUS: 0.7 mmol/L (ref 0.5–1.9)

## 2017-01-05 LAB — VITAMIN B12: Vitamin B-12: 224 pg/mL (ref 180–914)

## 2017-01-05 MED ORDER — ATORVASTATIN CALCIUM 10 MG PO TABS
10.0000 mg | ORAL_TABLET | Freq: Every day | ORAL | Status: DC
  Administered 2017-01-05 – 2017-01-07 (×3): 10 mg via ORAL
  Filled 2017-01-05 (×4): qty 1

## 2017-01-05 MED ORDER — CLOPIDOGREL BISULFATE 75 MG PO TABS
75.0000 mg | ORAL_TABLET | Freq: Every day | ORAL | Status: DC
  Administered 2017-01-05: 75 mg via ORAL
  Filled 2017-01-05: qty 1

## 2017-01-05 MED ORDER — INSULIN GLARGINE 100 UNIT/ML ~~LOC~~ SOLN
4.0000 [IU] | Freq: Every day | SUBCUTANEOUS | Status: DC
  Administered 2017-01-06 – 2017-01-07 (×2): 4 [IU] via SUBCUTANEOUS
  Filled 2017-01-05 (×4): qty 0.04

## 2017-01-05 MED ORDER — POLYETHYLENE GLYCOL 3350 17 G PO PACK
17.0000 g | PACK | Freq: Every day | ORAL | Status: DC
  Administered 2017-01-05 – 2017-01-06 (×2): 17 g via ORAL
  Filled 2017-01-05 (×4): qty 1

## 2017-01-05 MED ORDER — AMLODIPINE BESYLATE 5 MG PO TABS
5.0000 mg | ORAL_TABLET | Freq: Every day | ORAL | Status: DC
  Administered 2017-01-05 – 2017-01-07 (×3): 5 mg via ORAL
  Filled 2017-01-05 (×3): qty 1

## 2017-01-05 MED ORDER — LEVOTHYROXINE SODIUM 112 MCG PO TABS
112.0000 ug | ORAL_TABLET | Freq: Every day | ORAL | Status: DC
  Administered 2017-01-05 – 2017-01-06 (×2): 112 ug via ORAL
  Filled 2017-01-05 (×2): qty 1

## 2017-01-05 MED ORDER — ESCITALOPRAM OXALATE 10 MG PO TABS
15.0000 mg | ORAL_TABLET | Freq: Every day | ORAL | Status: DC
  Administered 2017-01-05 – 2017-01-08 (×4): 15 mg via ORAL
  Filled 2017-01-05 (×4): qty 2

## 2017-01-05 MED ORDER — METOCLOPRAMIDE HCL 5 MG/ML IJ SOLN
5.0000 mg | Freq: Two times a day (BID) | INTRAMUSCULAR | Status: DC
  Administered 2017-01-05 – 2017-01-08 (×7): 5 mg via INTRAVENOUS
  Filled 2017-01-05 (×7): qty 2

## 2017-01-05 MED ORDER — CARVEDILOL 6.25 MG PO TABS
3.1250 mg | ORAL_TABLET | Freq: Two times a day (BID) | ORAL | Status: DC
  Administered 2017-01-05 – 2017-01-07 (×6): 3.125 mg via ORAL
  Administered 2017-01-08: 6.25 mg via ORAL
  Administered 2017-01-08: 3.125 mg via ORAL
  Filled 2017-01-05 (×8): qty 1

## 2017-01-05 MED ORDER — PANTOPRAZOLE SODIUM 40 MG PO TBEC
40.0000 mg | DELAYED_RELEASE_TABLET | Freq: Every day | ORAL | Status: DC
  Administered 2017-01-05 – 2017-01-08 (×4): 40 mg via ORAL
  Filled 2017-01-05 (×4): qty 1

## 2017-01-05 NOTE — Progress Notes (Signed)
Order for soap suds enema discussed with patient. Patient requests to sleep and rest for a while prior to getting the SSE.

## 2017-01-05 NOTE — Progress Notes (Signed)
Large formed brown/green BM after 1000 ml SSE. States "I feel better"

## 2017-01-05 NOTE — Consult Note (Signed)
Consultation  Referring Provider:      Primary Care Physician:  Cassandra Copa, MD Primary Gastroenterologist:   Cassandra Jetty MD     Reason for Consultation:    Fecal impaction, anemia with intermittent vomiting.     Impression / Plan:   Fecal impaction: Improved. Another soap suds enema today. Start on Miralax 1 Tbsp a day  Anemia: Unclear cause. No workup seen in past. Obtain B12, folate, iron studies. Prior to discharge patient should have EGD and colonoscopy .  Intermittent vomiting. H/x of gastroparesis . Start on Reglan 40m IV bid and Protonix 471mpo once a day.          HPI:   Cassandra [name] a [age].o. female with a known history of DM, CKD3, HTN, hypothyroid, history of C. difficile admitted for significant abdominal pain and constipation. CT scan of abdomen confirmed fecal impaction. This am patient had large BM and abdominal pain has resolved. No bleeding reported.  Daughter mentioned intermittent vomiting for years. 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 She has been on reglan in the past. Patient was also recently seen in ER for vomiting and dehydration.  Chart review shows significant anemia. HGB 7.8 this am. Reports transfusion in past few months ago. she was placed on iron which exacerbated constipation. Denies any recent EGD/colonosocpy in patient 5 yrs. Hold Plavix . Continue  ASA. Transfuse if  HGB < 7.0  Past Medical History:  Diagnosis Date  . Allergic rhinitis   . Anemia   . Anxiety   . Arthritis    "mostly feet, hands" (10/17/2016)  . Benign hypertension with CKD (chronic kidney disease) stage III   . Benign paroxysmal positional vertigo   . [place] disorder ([place]  . Broken finger   . Broken shoulder   . Broken toes   . Cervicalgia   . CHF (congestive heart failure) ([place]/2017  . CKD (chronic kidney disease) stage 3, GFR 30-59 ml/min   . COPD [place] obstructive pulmonary disease) ([place]  . Depression   .  Elevated liver enzymes Hep B/C neg 2014  . Fall at nursing home 10/15/2016  . GERD (gastroesophageal reflux disease)   . Glaucoma   . High cholesterol   . History of alcohol use   . History of blood transfusion 09/2016   "blood got really really low"  . Hypertension   . Hypothyroidism   . Interstitial cystitis    bladder stretched every 9 months  . Migraines    "pretty much qd" (10/17/2016)  . Pneumonia 12/2016  . Psoriasis   . Stroke ([place]/2015   Left occipital and thalamic, right hippocampal  . Thyroid disease   . Tobacco use   . 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 Vitamin B12 deficiency     Past Surgical History:  Procedure Laterality Date  . ANTERIOR CERVICAL DECOMP/DISCECTOMY FUSION  ~ 2009  . APPENDECTOMY    . BACK SURGERY    . BLADDER SUSPENSION    . CATARACT EXTRACTION W/ INTRAOCULAR LENS  IMPLANT, BILATERAL Bilateral   . DILATION AND CURETTAGE OF UTERUS    . HERNIA REPAIR    . LAPAROSCOPIC CHOLECYSTECTOMY    . TONSILLECTOMY    . TUBAL LIGATION    . VAGINAL HYSTERECTOMY     with oophorectomy    Family History  Problem Relation Age of Onset  . Alcohol abuse Mother   . Arthritis Mother   .  Asthma Mother   . Cancer Mother     colon cancer  . Hypertension Mother   . Migraines Mother   . Stroke Mother   . Lung disease Mother   . COPD Mother   . Diabetes Father   . Hypertension Father   . Heart disease Father   . Heart attack Father   . Heart disease Paternal Grandmother   . Diabetes Paternal Grandmother   . Stroke Paternal Grandmother   . Cancer Paternal Grandmother   . Diabetes Paternal Grandfather       Social History  Substance Use Topics  . Smoking status: Former Smoker    Packs/day: 0.25    Years: 10.00    Types: Cigarettes    Quit date: 12/1923  . Smokeless tobacco: Never Used     Comment: QUIT SMOKING 11/2016  . Alcohol use No     Comment: Hasn't had any alcohol "for 2 yrs" (10/17/2016)    Prior to  Admission medications   Medication Sig Start Date End Date Taking? Authorizing Provider  amLODipine (NORVASC) 5 MG tablet Take 1 tablet (5 mg total) by mouth daily. 12/27/16  Yes Shanker Kristeen Mans, MD  atorvastatin (LIPITOR) 10 MG tablet Take 1 tablet (10 mg total) by mouth daily at 6 PM. 12/27/16  Yes Shanker Kristeen Mans, MD  carvedilol (COREG) 3.125 MG tablet Take 1 tablet (3.125 mg total) by mouth 2 (two) times daily with a meal. 12/27/16  Yes Shanker Kristeen Mans, MD  clopidogrel (PLAVIX) 75 MG tablet Take 1 tablet (75 mg total) by mouth daily. 12/27/16  Yes Shanker Kristeen Mans, MD  escitalopram (LEXAPRO) 10 MG tablet Take 1.5 tablets (15 mg total) by mouth daily. 12/27/16  Yes Shanker Kristeen Mans, MD  glucose monitoring kit (FREESTYLE) monitoring kit 1 each by Does not apply route 4 (four) times daily - after meals and at bedtime. 1 month Diabetic Testing Supplies for QAC-QHS accuchecks. 12/27/16  Yes Shanker Kristeen Mans, MD  Insulin Pen Needle 32G X 8 MM MISC Use as directed 12/27/16  Yes Shanker Kristeen Mans, MD  levothyroxine (SYNTHROID, LEVOTHROID) 112 MCG tablet Take 1 tablet (112 mcg total) by mouth daily before breakfast. 12/27/16  Yes Shanker Kristeen Mans, MD  acetaminophen (TYLENOL) 325 MG tablet Take 650 mg by mouth every 6 (six) hours as needed for mild pain or fever.     Historical Provider, MD  Amino Acids-Protein Hydrolys (FEEDING SUPPLEMENT, PRO-STAT SUGAR FREE 64,) LIQD Take 30 mLs by mouth 2 (two) times daily. Patient not taking: Reported on 01/04/2017 12/27/16   Jonetta Osgood, MD  aspirin 81 MG chewable tablet Chew 1 tablet (81 mg total) by mouth daily. Patient not taking: Reported on 01/04/2017 12/27/16   Jonetta Osgood, MD  famotidine (PEPCID) 20 MG tablet Take 1 tablet (20 mg total) by mouth daily. Patient not taking: Reported on 01/04/2017 12/27/16   Jonetta Osgood, MD  guaiFENesin (MUCINEX) 600 MG 12 hr tablet Take 1 tablet (600 mg total) by mouth 2 (two) times daily as needed for cough or to  loosen phlegm. Patient not taking: Reported on 01/04/2017 12/27/16   Jonetta Osgood, MD  Insulin Glargine (LANTUS) 100 UNIT/ML Solostar Pen Inject 4 Units into the skin daily at 10 pm. Patient taking differently: Inject 4 Units into the skin daily at 10 pm. Per sliding scale 12/27/16   Jonetta Osgood, MD  insulin lispro (HUMALOG JUNIOR KWIKPEN) 100 UNIT/ML KiwkPen Inject 0.01-0.05 mLs (1-5 Units total) into  the skin 3 (three) times daily before meals. For CBGs < 150 = 0 units; 151 - 200 = 1 units, 201 - 250 = 2 units, 251 - 300 = 3 units; 301-350 = 4 units; 351-400 = 5 units; > 400 notify the provider.  DO NOT GIVE IF PATIENT NOT EATING MEA Patient not taking: Reported on 01/04/2017 12/27/16   Jonetta Osgood, MD  iron polysaccharides (NIFEREX) 150 MG capsule Take 1 capsule (150 mg total) by mouth 2 (two) times daily. Patient not taking: Reported on 01/04/2017 12/27/16   Jonetta Osgood, MD  meclizine (ANTIVERT) 25 MG tablet Take 1 tablet (25 mg total) by mouth 3 (three) times daily as needed for dizziness. Patient not taking: Reported on 01/04/2017 12/27/16   Jonetta Osgood, MD  nystatin-triamcinolone ointment [facility]) Apply ointment every shift to perianal / buttocks for rash Patient not taking: Reported on 01/04/2017 12/27/16   Jonetta Osgood, MD  ondansetron (ZOFRAN) 4 MG tablet Take 1 tablet (4 mg total) by mouth every 8 (eight) hours as needed for nausea or vomiting. Patient not taking: Reported on 01/04/2017 12/29/16   Jonetta Osgood, MD  traMADol (ULTRAM) 50 MG tablet Take 1 tablet (50 mg total) by mouth every 6 (six) hours as needed. Patient not taking: Reported on 01/04/2017 12/27/16   Jonetta Osgood, MD    Current Facility-Administered Medications  Medication Dose Route Frequency Provider Last Rate Last Dose  . 0.9 %  sodium chloride infusion   Intravenous Continuous Alexis Hugelmeyer, DO 75 mL/hr at 01/05/17 0500    . albuterol (PROVENTIL) (2.5 MG/3ML) 0.083% nebulizer  solution 2.5 mg  2.5 mg Nebulization Q6H PRN Alexis Hugelmeyer, DO      . amLODipine (NORVASC) tablet 5 mg  5 mg Oral Daily Alexis Hugelmeyer, DO   5 mg at 01/05/17 0902  . atorvastatin (LIPITOR) tablet 10 mg  10 mg Oral q1800 Alexis Hugelmeyer, DO      . carvedilol (COREG) tablet 3.125 mg  3.125 mg Oral BID WC Alexis Hugelmeyer, DO   3.125 mg at 01/05/17 0902  . clopidogrel (PLAVIX) tablet 75 mg  75 mg Oral Daily Alexis Hugelmeyer, DO   75 mg at 01/05/17 0901  . enoxaparin (LOVENOX) injection 40 mg  40 mg Subcutaneous QHS Alexis Hugelmeyer, DO   40 mg at 01/05/17 0030  . escitalopram (LEXAPRO) tablet 15 mg  15 mg Oral Daily Alexis Hugelmeyer, DO   15 mg at 01/05/17 0902  . insulin aspart (novoLOG) injection 0-5 Units  0-5 Units Subcutaneous QHS Alexis Hugelmeyer, DO      . insulin aspart (novoLOG) injection 0-9 Units  0-9 Units Subcutaneous TID WC Alexis Hugelmeyer, DO   3 Units at 01/05/17 0902  . insulin glargine (LANTUS) injection 4 Units  4 Units Subcutaneous Q2200 Alexis Hugelmeyer, DO      . levothyroxine (SYNTHROID, LEVOTHROID) tablet 112 mcg  112 mcg Oral QAC breakfast Alexis Hugelmeyer, DO   112 mcg at 01/05/17 0901  . ondansetron (ZOFRAN) tablet 4 mg  4 mg Oral Q6H PRN Alexis Hugelmeyer, DO       Or  . ondansetron (ZOFRAN) injection 4 mg  4 mg Intravenous Q6H PRN Alexis Hugelmeyer, DO        Allergies as of 01/04/2017 - Review Complete 01/04/2017  Allergen Reaction Noted  . Alprazolam Other (See Comments) 10/16/2016  . Percocet [oxycodone-acetaminophen] Other (See Comments) 10/16/2016  . Codeine Diarrhea and Nausea And Vomiting 09/20/2015  . Doxycycline Diarrhea and  Nausea And Vomiting 09/20/2015  . Hydrocodone Nausea And Vomiting 03/03/2015  . Omnicef [cefdinir] Nausea Only and Other (See Comments) 09/20/2015  . Tramadol Other (See Comments) 01/04/2017  . Augmentin [amoxicillin-pot clavulanate] Hives and Rash 08/26/2016  . Ciprofloxacin Hives 01/17/2016     Review of  Systems:    This is positive for those things mentioned in the HPI, also positive for  . All other review of systems are negative.       Physical Exam:  Vital signs in last 24 hours: Temp:  [98.1 F (36.7 C)-99 F (37.2 C)] 98.1 F (36.7 C) (01/21 0900) Pulse Rate:  [79-106] 79 (01/21 0900) Resp:  [16-22] 18 (01/21 0900) BP: (148-182)/(62-100) 148/80 (01/21 0900) SpO2:  [94 %-100 %] 98 % (01/21 0900) Weight:  [51.7 kg (114 lb)-55.3 kg (121 lb 14.4 oz)] 55.3 kg (121 lb 14.4 oz) (01/20 2258) Last BM Date: 01/02/17  General:  Well-developed, well-nourished and in no acute distress Eyes:  anicteric. ENT:   Mouth and posterior pharynx free of lesions.  Neck:   supple w/o thyromegaly or mass.  Lungs: Clear to auscultation bilaterally. Heart:  S1S2, no rubs, murmurs, gallops. Abdomen:  soft, non-tender, no hepatosplenomegaly, hernia, or mass and BS+.  Rectal: Lymph:  no cervical or supraclavicular adenopathy. Extremities:   no edema Skin   no rash. Neuro:  A&O x 3.  Psych:  appropriate mood and  Affect.   Data Reviewed:   LAB RESULTS:  Recent Labs  01/04/17 1348 01/05/17 0330  WBC 12.2* 9.3  HGB 12.1 7.8*  HCT 34.4* 22.1*  PLT 485* 332   BMET  Recent Labs  01/04/17 1348 01/05/17 0041 01/05/17 0330  NA 138 139 140  K 3.3* 3.0* 3.0*  CL 106 110 111  CO2 19* 24 23  GLUCOSE 267* 183* 178*  BUN _0 CREATININE 1.27* 1.26* 1.28*  CALCIUM 8.6* 7.2* 7.1*   LFT  Recent Labs  01/04/17 1348  PROT 6.6  ALBUMIN 2.5*  AST 45*  ALT 24  ALKPHOS 131*  BILITOT 0.7   PT/INR No results for input(s): LABPROT, INR in the last 72 hours.  STUDIES: Dg Chest 2 View  Result Date: 01/04/2017 CLINICAL DATA:  Recent pneumonia EXAM: CHEST  2 VIEW COMPARISON:  12/24/2016 FINDINGS: Cardiomediastinal silhouette is stable. There is small right pleural effusion with right base posterior atelectasis. No infiltrate or pulmonary edema. Mild degenerative changes thoracic  spine. Mild degenerative changes bilateral shoulders. Mild thoracic dextroscoliosis. IMPRESSION: Small right pleural effusion with right base atelectasis. No infiltrate or pulmonary edema. Degenerative changes thoracic spine and bilateral shoulders. Electronically Signed   By: Lahoma Crocker M.D.   On: 01/04/2017 17:00   Ct Abdomen Pelvis W Contrast  Result Date: 01/04/2017 CLINICAL DATA:  Abdominal pain with no bowel movement for 3 days. History of CHF, chronic renal insufficiency, appendectomy, hysterectomy, and cholecystectomy. EXAM: CT ABDOMEN AND PELVIS WITH CONTRAST TECHNIQUE: Multidetector CT imaging of the abdomen and pelvis was performed using the standard protocol following bolus administration of intravenous contrast. CONTRAST:  1 ISOVUE-300 IOPAMIDOL (ISOVUE-300) INJECTION 61%, 61m ISOVUE-300 IOPAMIDOL (ISOVUE-300) INJECTION 61% COMPARISON:  September 18, 2016 FINDINGS: Lower chest: Bilateral pleural effusions with underlying atelectasis, right greater than left. No other abnormalities identified in the lung bases. Hepatobiliary: The patient is status post cholecystectomy with intra and extrahepatic biliary duct prominence/dilatation unchanged since October 2017. No acute abnormalities are seen in the liver. The portal veins are normal in appearance. Pancreas: The pancreas  is normal in appearance. Spleen: Spleen is normal. Adrenals/Urinary Tract: The adrenal glands are normal. No renal stones, masses, or hydronephrosis. No ureterectasis or ureteral stones. The bladder is unremarkable. Stomach/Bowel: The stomach and small bowel are normal. Marked fecal loading is seen in the colon. There is a large stool ball in the rectum with mild wall thickening. It is difficult to assess the adjacent fat given ascites which will be described below. There is no pneumatosis. The fecal loading extends from the distal descending colon into the rectum. A short segment of proximal ascending colon is difficult to evaluate due  to poor distention. However, no abnormality was seen in this region in October 2017 and the finding is likely due to peristalsis. Vascular/Lymphatic: Atherosclerotic changes seen in the non aneurysmal aorta, iliac vessels, and femoral vessels. No adenopathy. Reproductive: The patient is status post hysterectomy. Other: Increased attenuation in the subcutaneous fat diffusely and in the mesenteries consistent with volume overload. There is moderate to severe ascites. Musculoskeletal: No acute or significant osseous findings. IMPRESSION: 1. Moderate bilateral pleural effusions with underlying atelectasis. Increased attenuation in the subcutaneous fat diffusely consistent with edema. Moderate to severe ascites. 2. Large stool ball in the rectum. There is mild associated wall thickening and the patient is at risk for stercoral colitis. Fecal loading extending from the distal descending colon into the rectum. 3. Atherosclerosis in the abdominal aorta. Electronically Signed   By: Dorise Bullion III M.D   On: 01/04/2017 18:08   Dg Abd 2 Views  Result Date: 01/04/2017 CLINICAL DATA:  Generalized abdominal pain.  No bowel movement. EXAM: ABDOMEN - 2 VIEW COMPARISON:  None. FINDINGS: Mild opacity is not excluded in the right base. Left lung base is normal. No free air, portal venous gas, or pneumatosis. No renal or ureteral stones. Cholecystectomy clips. Fecal loading in the rectum. Nonobstructive bowel gas pattern. IMPRESSION: 1. Mild infiltrate is not excluded in the right lung base. Recommend a PA and lateral chest x-ray for better evaluation. 2. Fecal loading in the rectum.  No other abdominal abnormalities. Electronically Signed   By: Dorise Bullion III M.D   On: 01/04/2017 15:39     PREVIOUS ENDOSCOPIES:                Thanks   LOS: 0 days   _0  E. Carlean Purl, MD, [facility] @  01/05/2017, 11:41 AM

## 2017-01-05 NOTE — Progress Notes (Signed)
[facility] [place] [facility]   PATIENT NAME: Cassandra [name]    MR#:  [id number]  DATE OF BIRTH:  Mar 13, 1946  SUBJECTIVE: Admitted for abdominal pain, constipation. Found to have fecal impaction. Bicarbonate is low. Received IV fluids. Had a bowel movement after admission last night. Found to have anemia with hemoglobin dropped from 12-1.8.   CHIEF COMPLAINT:   Chief Complaint  Patient presents with  . Abdominal Pain    REVIEW OF SYSTEMS:    Review of Systems  Constitutional: Negative for chills and fever.  HENT: Negative for hearing loss.   Eyes: Negative for blurred vision, double vision and photophobia.  Respiratory: Negative for cough, hemoptysis and shortness of breath.   Cardiovascular: Negative for palpitations, orthopnea and leg swelling.  Gastrointestinal: Positive for constipation. Negative for abdominal pain, blood in stool, diarrhea and vomiting.  Genitourinary: Negative for dysuria and urgency.  Musculoskeletal: Negative for myalgias and neck pain.  Skin: Negative for rash.  Neurological: Negative for dizziness, focal weakness, seizures, weakness and headaches.  Psychiatric/Behavioral: Negative for memory loss. The patient does not have insomnia.     Nutrition:  Tolerating Diet: Tolerating PT:      DRUG ALLERGIES:   Allergies  Allergen Reactions  . Alprazolam Other (See Comments)    Family preference, for patient to not take med  . Percocet [Oxycodone-Acetaminophen] Other (See Comments)    Family preference, for patient to not take med  . Codeine Diarrhea and Nausea And Vomiting  . Doxycycline Diarrhea and Nausea And Vomiting  . Hydrocodone Nausea And Vomiting  . Omnicef [Cefdinir] Nausea Only and Other (See Comments)    Constipation, tolerated Zosyn  . Tramadol Other (See Comments)    Pt hallucinates when taking medication. Family request not to give medication to pt   . Augmentin [Amoxicillin-Pot Clavulanate] Hives and  Rash    Has patient had a PCN reaction causing immediate rash, facial/tongue/throat swelling, SOB or lightheadedness with hypotension: Yes Has patient had a PCN reaction causing severe rash involving mucus membranes or skin necrosis: Yes Did a PCN reaction that required hospitalization No Did PCN reaction occurring within the last 10 years: Yes If all of the above answers are "NO", then may proceed with Cephalosporin use.  Pt states she has taken penicillin since, and was ok with it...  . Ciprofloxacin Hives    Tolerated LVQ in 12/2016    VITALS:  Blood pressure (!) 148/80, pulse 79, temperature 98.1 F (36.7 C), temperature source Oral, resp. rate 18, height 5\' 2"  (1.575 m), weight 55.3 kg (121 lb 14.4 oz), SpO2 98 %.  PHYSICAL EXAMINATION:   Physical Exam  GENERAL:  71 y.o.-year-old patient lying in the bed with no acute distress. Appears.appears pale,.  EYES: Pupils equal, round, reactive to light and accommodation. No scleral icterus. Extraocular muscles intact.  HEENT: Head atraumatic, normocephalic. Oropharynx and nasopharynx clear.  NECK:  Supple, no jugular venous distention. No thyroid enlargement, no tenderness.  LUNGS: Normal breath sounds bilaterally, no wheezing, rales,rhonchi or crepitation. No use of accessory muscles of respiration.  CARDIOVASCULAR: S1, S2 normal. No murmurs, rubs, or gallops.  ABDOMEN: Soft, nontender, nondistended. Bowel sounds present. No organomegaly or mass.  EXTREMITIES: No pedal edema, cyanosis, or clubbing.  NEUROLOGIC: Cranial nerves II through XII are intact. Muscle strength 5/5 in all extremities. Sensation intact. Gait not checked.  PSYCHIATRIC: The patient is alert and oriented x 3.  SKIN: No obvious rash, lesion, or ulcer.    LABORATORY  PANEL:   CBC  Recent Labs Lab 01/05/17 0330  WBC 9.3  HGB 7.8*  HCT 22.1*  PLT 332    ------------------------------------------------------------------------------------------------------------------  Chemistries   Recent Labs Lab 01/04/17 1348  01/05/17 0330  NA 138  < > 140  K 3.3*  < > 3.0*  CL 106  < > 111  CO2 19*  < > 23  GLUCOSE 267*  < > 178*  BUN 17  < > 17  CREATININE 1.27*  < > 1.28*  CALCIUM 8.6*  < > 7.1*  AST 45*  --   --   ALT 24  --   --   ALKPHOS 131*  --   --   BILITOT 0.7  --   --   < > = values in this interval not displayed. ------------------------------------------------------------------------------------------------------------------  Cardiac Enzymes No results for input(s): TROPONINI in the last 168 hours. ------------------------------------------------------------------------------------------------------------------  RADIOLOGY:  Dg Chest 2 View  Result Date: 01/04/2017 CLINICAL DATA:  Recent pneumonia EXAM: CHEST  2 VIEW COMPARISON:  12/24/2016 FINDINGS: Cardiomediastinal silhouette is stable. There is small right pleural effusion with right base posterior atelectasis. No infiltrate or pulmonary edema. Mild degenerative changes thoracic spine. Mild degenerative changes bilateral shoulders. Mild thoracic dextroscoliosis. IMPRESSION: Small right pleural effusion with right base atelectasis. No infiltrate or pulmonary edema. Degenerative changes thoracic spine and bilateral shoulders. Electronically Signed   By: Natasha MeadLiviu  Pop M.D.   On: 01/04/2017 17:00   Ct Abdomen Pelvis W Contrast  Result Date: 01/04/2017 CLINICAL DATA:  Abdominal pain with no bowel movement for 3 days. History of CHF, chronic renal insufficiency, appendectomy, hysterectomy, and cholecystectomy. EXAM: CT ABDOMEN AND PELVIS WITH CONTRAST TECHNIQUE: Multidetector CT imaging of the abdomen and pelvis was performed using the standard protocol following bolus administration of intravenous contrast. CONTRAST:  1 ISOVUE-300 IOPAMIDOL (ISOVUE-300) INJECTION 61%, 75mL ISOVUE-300  IOPAMIDOL (ISOVUE-300) INJECTION 61% COMPARISON:  September 18, 2016 FINDINGS: Lower chest: Bilateral pleural effusions with underlying atelectasis, right greater than left. No other abnormalities identified in the lung bases. Hepatobiliary: The patient is status post cholecystectomy with intra and extrahepatic biliary duct prominence/dilatation unchanged since October 2017. No acute abnormalities are seen in the liver. The portal veins are normal in appearance. Pancreas: The pancreas is normal in appearance. Spleen: Spleen is normal. Adrenals/Urinary Tract: The adrenal glands are normal. No renal stones, masses, or hydronephrosis. No ureterectasis or ureteral stones. The bladder is unremarkable. Stomach/Bowel: The stomach and small bowel are normal. Marked fecal loading is seen in the colon. There is a large stool ball in the rectum with mild wall thickening. It is difficult to assess the adjacent fat given ascites which will be described below. There is no pneumatosis. The fecal loading extends from the distal descending colon into the rectum. A short segment of proximal ascending colon is difficult to evaluate due to poor distention. However, no abnormality was seen in this region in October 2017 and the finding is likely due to peristalsis. Vascular/Lymphatic: Atherosclerotic changes seen in the non aneurysmal aorta, iliac vessels, and femoral vessels. No adenopathy. Reproductive: The patient is status post hysterectomy. Other: Increased attenuation in the subcutaneous fat diffusely and in the mesenteries consistent with volume overload. There is moderate to severe ascites. Musculoskeletal: No acute or significant osseous findings. IMPRESSION: 1. Moderate bilateral pleural effusions with underlying atelectasis. Increased attenuation in the subcutaneous fat diffusely consistent with edema. Moderate to severe ascites. 2. Large stool ball in the rectum. There is mild associated wall thickening and the  patient is at  risk for stercoral colitis. Fecal loading extending from the distal descending colon into the rectum. 3. Atherosclerosis in the abdominal aorta. Electronically Signed   By: Gerome Sam III M.D   On: 01/04/2017 18:08   Dg Abd 2 Views  Result Date: 01/04/2017 CLINICAL DATA:  Generalized abdominal pain.  No bowel movement. EXAM: ABDOMEN - 2 VIEW COMPARISON:  None. FINDINGS: Mild opacity is not excluded in the right base. Left lung base is normal. No free air, portal venous gas, or pneumatosis. No renal or ureteral stones. Cholecystectomy clips. Fecal loading in the rectum. Nonobstructive bowel gas pattern. IMPRESSION: 1. Mild infiltrate is not excluded in the right lung base. Recommend a PA and lateral chest x-ray for better evaluation. 2. Fecal loading in the rectum.  No other abdominal abnormalities. Electronically Signed   By: Gerome Sam III M.D   On: 01/04/2017 15:39     ASSESSMENT AND PLAN:   Active Problems:   Fecal impaction (HCC)   Constipation   Generalized abdominal pain   #1 fecal impaction: Continue stool softeners, soap suds enema.at risk for stercoral ulcer.  #2 patient has moderate to severe ascites by CAT scan:  Seen by gastroenterology.  3 acute on chronic anemia: baseline hb 8. Check stool for guaiacs, anemia panel, hold aspirin and Plavix, check CBC again in 8 hours.  #4. Diabetes Mellitus type 2;. Intermittent nausea vomiting; h/o diabetic gastroparesis started on Reglan.  #5 hypothyroidism Hypokalemia: Replace the potassium. 6 metabolic acidosis: Bicarbonate 19 yesterday improved to 23 today. Discontinue IV fluids. Seen by surgery.  All the records are reviewed and case discussed with Care Management/Social Workerr. Management plans discussed with the patient, family and they are in agreement.  CODE STATUS: full  TOTAL TIME TAKING CARE OF THIS PATIENT: 35 minutes.   POSSIBLE D/C IN 1-2DAYS, DEPENDING ON CLINICAL CONDITION.   Katha Hamming M.D  on 01/05/2017 at 2:08 PM  Between 7am to 6pm - Pager - [phone]  After 6pm go to [email or web address] - password [email or web address] [facility]  [facility] [place] [facility]  Office  [phone]  CC: Primary care physician; Irving Copas, MD

## 2017-01-05 NOTE — Progress Notes (Signed)
71 yr old female with fecal impaction.  She had manual disimpaction in ED x 2 and large stools with enemas.  Now much improved.  WBC normalized and lactic acid low throughout this admission.  No acute surgical issues, surgery team will sign off.  Please call if we can be of further assistance.

## 2017-01-05 NOTE — Progress Notes (Signed)
PT Cancellation Note  Patient Details Name: Cassandra AbbeBillie A Gavilanes MRN: [id number] DOB: 10/14/1946   Cancelled Treatment:    Reason Eval/Treat Not Completed: Patient declined, no reason specified From initial introduction and brief history gathering pt showed no interest in participating today. She was covered up in her blankets and reports that she hardly slept last night and has beening vomiting every time she sits up or tries to do anything this AM.  PT gave her many options/reasons to participate and much encouragement, but ultimately she refused and said to come back tomorrow.  Pt states she will be returning to live with her daughter and does not want to do rehab.  PT will try for eval tomorrow.    Malachi ProGalen R Deina Lipsey , DPT 01/05/2017, 10:13 AM

## 2017-01-05 NOTE — Clinical Social Work Note (Signed)
CSW received consult for FL 2. This patient has no indication of facility based needs at this time. The patient's RN Ree KidaJack indicated that he received no handoff indicating a need for CSW. CSW signing off, but available should any needs arise.  Argentina PonderKaren Martha Maymie Brunke, MSW, Theresia MajorsLCSWA [phone]

## 2017-01-05 NOTE — Progress Notes (Signed)
Hypoglycemic Event  CBG: 66  Treatment: 15 GM carbohydrate snack  Symptoms: Pale  Follow-up CBG: Time:2355 CBG Result:91  Possible Reasons for Event: Medication Regiment/ inadequate intake  Comments/MD notified: Patient asymptomatic but skin is a little pale. Patient has been on a clear liquid diet due to impaction though she has a heart healthy diet ordered. Orange juice given and peanut butter ( with no crackers).  Patient is alert and oriented and complains of no discomfort. Will continue to monitor.    Cassandra [name]

## 2017-01-06 ENCOUNTER — Observation Stay: Payer: Medicare Other

## 2017-01-06 DIAGNOSIS — R188 Other ascites: Secondary | ICD-10-CM

## 2017-01-06 DIAGNOSIS — D649 Anemia, unspecified: Secondary | ICD-10-CM | POA: Diagnosis not present

## 2017-01-06 DIAGNOSIS — K5900 Constipation, unspecified: Secondary | ICD-10-CM | POA: Diagnosis not present

## 2017-01-06 LAB — BODY FLUID CELL COUNT WITH DIFFERENTIAL
Eos, Fluid: 0 %
Lymphs, Fluid: 7 %
MONOCYTE-MACROPHAGE-SEROUS FLUID: 86 %
NEUTROPHIL FLUID: 7 %
Other Cells, Fluid: 0 %
WBC FLUID: 54 uL

## 2017-01-06 LAB — PROTIME-INR
INR: 0.96
Prothrombin Time: 12.8 seconds (ref 11.4–15.2)

## 2017-01-06 LAB — RETICULOCYTES
RBC.: 2.52 MIL/uL — ABNORMAL LOW (ref 3.80–5.20)
RETIC CT PCT: 3.3 % — AB (ref 0.4–3.1)
Retic Count, Absolute: 83.2 10*3/uL (ref 19.0–183.0)

## 2017-01-06 LAB — HEMOGLOBIN: Hemoglobin: 7.9 g/dL — ABNORMAL LOW (ref 12.0–16.0)

## 2017-01-06 LAB — GLUCOSE, CAPILLARY
GLUCOSE-CAPILLARY: 244 mg/dL — AB (ref 65–99)
Glucose-Capillary: 185 mg/dL — ABNORMAL HIGH (ref 65–99)
Glucose-Capillary: 221 mg/dL — ABNORMAL HIGH (ref 65–99)
Glucose-Capillary: 244 mg/dL — ABNORMAL HIGH (ref 65–99)
Glucose-Capillary: 198 mg/dL — ABNORMAL HIGH (ref 65–99)

## 2017-01-06 LAB — POTASSIUM: POTASSIUM: 3.3 mmol/L — AB (ref 3.5–5.1)

## 2017-01-06 LAB — GLUCOSE, PERITONEAL FLUID: Glucose, Peritoneal Fluid: 262 mg/dL

## 2017-01-06 LAB — ALBUMIN, FLUID (OTHER): Albumin, Fluid: <1 g/dL

## 2017-01-06 LAB — TSH: TSH: 136.049 u[IU]/mL — ABNORMAL HIGH (ref 0.350–4.500)

## 2017-01-06 LAB — PROTEIN, BODY FLUID: Total protein, fluid: <3 g/dL

## 2017-01-06 LAB — MAGNESIUM: MAGNESIUM: 1.8 mg/dL (ref 1.7–2.4)

## 2017-01-06 LAB — LACTATE DEHYDROGENASE, PLEURAL OR PERITONEAL FLUID: LD FL: 48 U/L — AB (ref 3–23)

## 2017-01-06 LAB — BILIRUBIN, TOTAL: BILIRUBIN TOTAL: 0.2 mg/dL — AB (ref 0.3–1.2)

## 2017-01-06 MED ORDER — CYANOCOBALAMIN 1000 MCG/ML IJ SOLN
1000.0000 ug | Freq: Every day | INTRAMUSCULAR | Status: AC
  Administered 2017-01-06 – 2017-01-08 (×3): 1000 ug via SUBCUTANEOUS
  Filled 2017-01-06 (×4): qty 1

## 2017-01-06 MED ORDER — BISACODYL 10 MG RE SUPP
10.0000 mg | Freq: Once | RECTAL | Status: DC
  Filled 2017-01-06: qty 1

## 2017-01-06 MED ORDER — POTASSIUM CHLORIDE CRYS ER 20 MEQ PO TBCR
40.0000 meq | EXTENDED_RELEASE_TABLET | ORAL | Status: AC
  Administered 2017-01-06 (×2): 40 meq via ORAL
  Filled 2017-01-06 (×2): qty 2

## 2017-01-06 MED ORDER — LEVOTHYROXINE SODIUM 75 MCG PO TABS
200.0000 ug | ORAL_TABLET | Freq: Every day | ORAL | Status: DC
  Administered 2017-01-07 – 2017-01-08 (×2): 200 ug via ORAL
  Filled 2017-01-06 (×2): qty 1

## 2017-01-06 MED ORDER — IBUPROFEN 400 MG PO TABS
400.0000 mg | ORAL_TABLET | Freq: Once | ORAL | Status: AC
  Administered 2017-01-06: 400 mg via ORAL
  Filled 2017-01-06: qty 1

## 2017-01-06 MED ORDER — FUROSEMIDE 40 MG PO TABS
40.0000 mg | ORAL_TABLET | Freq: Every day | ORAL | Status: DC
  Administered 2017-01-06: 40 mg via ORAL
  Filled 2017-01-06: qty 1

## 2017-01-06 NOTE — Progress Notes (Signed)
Soap Suds enema given and patient tolerated well.  One person assist to the bedside commode. Patient had a moderate to large of amount of formed stool-brown.  No blood noted in stool. Will continue to monitor patient to end of shift.

## 2017-01-06 NOTE — Progress Notes (Signed)
Spoke to daughter Joni Reiningicole earlier in the shift and was able to update her on her mother. Informed her that during the day shift previous nurse administered a soap suds enema to the patient and her mother had a large bowel movement. Also informed her that the patient will be getting another soap suds enema on the night shift.  Daughter also mentioned that she would like for nurses and staff to encourage her to get up to the bedside commode for when she is at home, she states the patient does not want to get out of bed which could have contributed to her impaction. Daughter also mentioned that one of the attending doctors that she spoke with had mentioned the patient having an Endoscopy and or colonoscopy and daughter is adamantly requesting that be done while the patient is in the hospital. Explained that no orders have been placed but will report off to oncoming nurse of her request for an Endoscopy/Colonoscopy and to let rounding doctors know as well.

## 2017-01-06 NOTE — Evaluation (Signed)
Physical Therapy Evaluation Patient Details Name: Cassandra [name] MRN: [id number] DOB: 06/09/1946 Today's Date: 01/06/2017   History of Present Illness  Pt is a 71 y/o F who was admitted to [facility] ~2 weeks PTA for acute encephalopathy as well as hypoglycemia.  She was then d/c to a SNF where she stayed until 1/12.  She was home at her daughter's house for ~5 days PTA until she began c/o crampy abdominal pain, generalized weakness, poor po intake.  Pt found to have fecal impaction.  Pt's PMH includes anemia, anxiety, bipolar disorder, cervicalgia, CHF, COPD, CKD, depression, glaucoma, stroke, migraines, ACDF, back surgery, hernia repair.    Clinical Impression  Pt admitted with above diagnosis. Pt currently with functional limitations due to the deficits listed below (see PT Problem List). [name] presents with a flat affect and requires motivation to participate with therapy but is agreeable.  PTA pt reports she was ambulating without AD, no falls in the past 6 months, but was limited to household distances of ~40 ft due to fatigue.  She currently requires min assist for bed mobility and mod assist to stand and take a few steps to the chair due to instability. She lives with her daughter who works during the day.  Pt has a personal care attendant 5 days/wk from 8am-1pm and is alone from 1pm until daughter gets home from work. Given pt's current mobility status and increased risk of falling, recommending 24/7 assist/supervision at d/c.  Therefore, recommending SNF as pt currently does not have 24/7 assist/supervision available at home.      Follow Up Recommendations SNF;Supervision/Assistance - 24 hour (HHPT if pt refusing SNF)    Equipment Recommendations  None recommended by PT    Recommendations for Other Services       Precautions / Restrictions Precautions Precautions: Fall Restrictions Weight Bearing Restrictions: No      Mobility  Bed Mobility Overal bed mobility: Needs  Assistance Bed Mobility: Rolling;Sidelying to Sit Rolling: Min guard Sidelying to sit: Min assist;HOB elevated       General bed mobility comments: Cues for log roll as pt with h/o abdominal pain due to impaction.  Pt uses bed rail to roll.  Min assist to boost from sidelying to sitting to elevate trunk.    Transfers Overall transfer level: Needs assistance Equipment used: 1 person hand held assist Transfers: Sit to/from [employer] Sit to Stand: Min assist Stand pivot transfers: Mod assist       General transfer comment: Pt requires 1 person HHA for sit<>stand and stand pivot transfers due to unsteadiness.  She demonstrates a flexed posture.  Ambulation/Gait Ambulation/Gait assistance: Mod assist Ambulation Distance (Feet): 4 Feet Assistive device: 1 person hand held assist Gait Pattern/deviations: Staggering left;Staggering right;Trunk flexed     General Gait Details: Pt is unsteady as she takes a few steps before pivoting to sit in chair.  She requires 1 person HHA to remain steady.  Stairs            Wheelchair Mobility    Modified Rankin (Stroke Patients Only)       Balance Overall balance assessment: Needs assistance Sitting-balance support: No upper extremity supported;Feet supported Sitting balance-Leahy Scale: Good     Standing balance support: Single extremity supported;During functional activity Standing balance-Leahy Scale: Poor Standing balance comment: Relies on UE support  Pertinent Vitals/Pain Pain Assessment: No/denies pain Pain Intervention(s): Limited activity within patient's tolerance;Monitored during session    Home Living Family/patient expects to be discharged to:: Private residence Living Arrangements: Children Available Help at Discharge: Family;Personal care attendant;Available PRN/intermittently Type of Home: House Home Access: Level entry     Home Layout:  Multi-level;Able to live on main level with bedroom/bathroom (lives in basement apartment at daughter's house) Home Equipment: Dan Humphreys - 2 wheels Additional Comments: Lives with daughter who works during the day.  Pt has a personal care attendant 5 days/wk from 8am-1pm and is alone from 1pm until daughter gets home from work.    Prior Function Level of Independence: Needs assistance   Gait / Transfers Assistance Needed: Pt reports she ambulates without AD and denies any falls in the past 6 months.  Limited ambulatory distance to ~40 ft due to fatigue.  ADL's / Homemaking Assistance Needed: Needs assist for sponge bath and dressing.  Aide does the cooking and cleaning.  Daughter does the driving.        Hand Dominance   Dominant Hand: Right    Extremity/Trunk Assessment   Upper Extremity Assessment Upper Extremity Assessment: RUE deficits/detail;LUE deficits/detail RUE Deficits / Details: Strength grossly 3+/5 LUE Deficits / Details: Strength grossly 3+/5    Lower Extremity Assessment Lower Extremity Assessment: RLE deficits/detail;LLE deficits/detail RLE Deficits / Details: Strength grossly 4/5 LLE Deficits / Details: Strength grossly 4/5       Communication   Communication: No difficulties  Cognition Arousal/Alertness: Awake/alert Behavior During Therapy: Flat affect Overall Cognitive Status: No family/caregiver present to determine baseline cognitive functioning Area of Impairment: Orientation Orientation Level: Disoriented to;Time                  General Comments General comments (skin integrity, edema, etc.): Pt declines ambulating in room saying, "I am too weak and tired".  Explained to the pt the benefits of progressing mobility and the potential consequences of delaying mobility but pt continued to refuse ambulating in room or hallway.    Exercises     Assessment/Plan    PT Assessment Patient needs continued PT services  PT Problem List Decreased  strength;Decreased activity tolerance;Decreased balance;Decreased cognition;Decreased knowledge of use of DME;Decreased safety awareness          PT Treatment Interventions DME instruction;Gait training;Functional mobility training;Therapeutic activities;Therapeutic exercise;Balance training;Cognitive remediation;Patient/family education;Neuromuscular re-education    PT Goals (Current goals can be found in the Care Plan section)  Acute Rehab PT Goals Patient Stated Goal: to go home at d/c with her daughter PT Goal Formulation: With patient Time For Goal Achievement: 01/20/17 Potential to Achieve Goals: Fair    Frequency Min 2X/week   Barriers to discharge Decreased caregiver support Pt does not have 24/7 assist/supervision available at home    Co-evaluation               End of Session Equipment Utilized During Treatment: Gait belt Activity Tolerance: Patient limited by fatigue Patient left: in chair;with call bell/phone within reach;Other (comment) (no chair alarm available) Nurse Communication: Mobility status;Other (comment) (no chair alarm available, pt sitting in chair)    Functional Assessment Tool Used: Clinical Judgement Functional Limitation: Mobility: Walking and moving around Mobility: Walking and Moving Around Current Status (Z6109): At least 20 percent but less than 40 percent impaired, limited or restricted Mobility: Walking and Moving Around Goal Status [phone]): At least 1 percent but less than 20 percent impaired, limited or restricted    Time: 0981-1914 PT  Time Calculation (min) (ACUTE ONLY): 19 min   Charges:   PT Evaluation $PT Eval Low Complexity: 1 Procedure     PT G Codes:   PT G-Codes **NOT FOR INPATIENT CLASS** Functional Assessment Tool Used: Clinical Judgement Functional Limitation: Mobility: Walking and moving around Mobility: Walking and Moving Around Current Status (Z6109([id number]): At least 20 percent but less than 40 percent impaired, limited  or restricted Mobility: Walking and Moving Around Goal Status [phone]([id number]): At least 1 percent but less than 20 percent impaired, limited or restricted    Encarnacion ChuAshley Abashian PT, DPT 01/06/2017, 10:50 AM

## 2017-01-06 NOTE — Progress Notes (Signed)
Notified Dr. Lars PinksWhol of patient- stated that there is a note reguarding EGD/coloscopy for patient by Dr. Earlean PolkaSolik on 1/21.  There are no orders placed for either procedure. Per Dr. Lars PinksWhol- no procedure at this time and patient is able to eat her diet.

## 2017-01-06 NOTE — Progress Notes (Signed)
[facility] - [place] at [facility]   PATIENT NAME: Cassandra [name]    MR#:  [id number]  DATE OF BIRTH:  1946/09/09  SUBJECTIVE:   CHIEF COMPLAINT:   Chief Complaint  Patient presents with  . Abdominal Pain   No vomiting  REVIEW OF SYSTEMS:    Review of Systems  Constitutional: Negative for chills and fever.  HENT: Negative for hearing loss.   Eyes: Negative for blurred vision, double vision and photophobia.  Respiratory: Negative for cough, hemoptysis and shortness of breath.   Cardiovascular: Negative for palpitations, orthopnea and leg swelling.  Gastrointestinal: Positive for constipation. Negative for abdominal pain, blood in stool, diarrhea and vomiting.  Genitourinary: Negative for dysuria and urgency.  Musculoskeletal: Negative for myalgias and neck pain.  Skin: Negative for rash.  Neurological: Negative for dizziness, focal weakness, seizures, weakness and headaches.  Psychiatric/Behavioral: Negative for memory loss. The patient does not have insomnia.    DRUG ALLERGIES:   Allergies  Allergen Reactions  . Alprazolam Other (See Comments)    Family preference, for patient to not take med  . Percocet [Oxycodone-Acetaminophen] Other (See Comments)    Family preference, for patient to not take med  . Codeine Diarrhea and Nausea And Vomiting  . Doxycycline Diarrhea and Nausea And Vomiting  . Hydrocodone Nausea And Vomiting  . Omnicef [Cefdinir] Nausea Only and Other (See Comments)    Constipation, tolerated Zosyn  . Tramadol Other (See Comments)    Pt hallucinates when taking medication. Family request not to give medication to pt   . Augmentin [Amoxicillin-Pot Clavulanate] Hives and Rash    Has patient had a PCN reaction causing immediate rash, facial/tongue/throat swelling, SOB or lightheadedness with hypotension: Yes Has patient had a PCN reaction causing severe rash involving mucus membranes or skin necrosis: Yes Did a PCN reaction that  required hospitalization No Did PCN reaction occurring within the last 10 years: Yes If all of the above answers are "NO", then may proceed with Cephalosporin use.  Pt states she has taken penicillin since, and was ok with it...  . Ciprofloxacin Hives    Tolerated LVQ in 12/2016    VITALS:  Blood pressure (!) 122/56, pulse 66, temperature 98.5 F (36.9 C), temperature source Oral, resp. rate 17, height 5\' 2"  (1.575 m), weight 55.3 kg (121 lb 14.4 oz), SpO2 100 %.  PHYSICAL EXAMINATION:   Physical Exam  GENERAL:  71 y.o.-year-old patient lying in the bed with no acute distress. Appears.appears pale,.  EYES: Pupils equal, round, reactive to light and accommodation. No scleral icterus. Extraocular muscles intact.  HEENT: Head atraumatic, normocephalic. Oropharynx and nasopharynx clear.  NECK:  Supple, no jugular venous distention. No thyroid enlargement, no tenderness.  LUNGS: Normal breath sounds bilaterally, no wheezing, rales,rhonchi or crepitation. No use of accessory muscles of respiration.  CARDIOVASCULAR: S1, S2 normal. No murmurs, rubs, or gallops.  ABDOMEN: Soft, nontender, nondistended. Bowel sounds present. No organomegaly or mass.  EXTREMITIES: No pedal edema, cyanosis, or clubbing.  NEUROLOGIC: Cranial nerves II through XII are intact. Muscle strength 5/5 in all extremities. Sensation intact. Gait not checked.  PSYCHIATRIC: The patient is alert and oriented SKIN: No obvious rash, lesion, or ulcer.   LABORATORY PANEL:   CBC  Recent Labs Lab 01/05/17 0330 01/06/17 0856  WBC 9.3  --   HGB 7.8* 7.9*  HCT 22.1*  --   PLT 332  --    ------------------------------------------------------------------------------------------------------------------  Chemistries   Recent Labs Lab 01/04/17 1348  01/05/17 0330 01/06/17 0905  NA 138  < > 140  --   K 3.3*  < > 3.0* 3.3*  CL 106  < > 111  --   CO2 19*  < > 23  --   GLUCOSE 267*  < > 178*  --   BUN 17  < > 17  --    CREATININE 1.27*  < > 1.28*  --   CALCIUM 8.6*  < > 7.1*  --   MG  --   --   --  1.8  AST 45*  --   --   --   ALT 24  --   --   --   ALKPHOS 131*  --   --   --   BILITOT 0.7  --   --   --   < > = values in this interval not displayed. ------------------------------------------------------------------------------------------------------------------  Cardiac Enzymes No results for input(s): TROPONINI in the last 168 hours. ------------------------------------------------------------------------------------------------------------------  RADIOLOGY:  Dg Chest 2 View  Result Date: 01/04/2017 CLINICAL DATA:  Recent pneumonia EXAM: CHEST  2 VIEW COMPARISON:  12/24/2016 FINDINGS: Cardiomediastinal silhouette is stable. There is small right pleural effusion with right base posterior atelectasis. No infiltrate or pulmonary edema. Mild degenerative changes thoracic spine. Mild degenerative changes bilateral shoulders. Mild thoracic dextroscoliosis. IMPRESSION: Small right pleural effusion with right base atelectasis. No infiltrate or pulmonary edema. Degenerative changes thoracic spine and bilateral shoulders. Electronically Signed   By: Natasha Mead M.D.   On: 01/04/2017 17:00   Ct Abdomen Pelvis W Contrast  Result Date: 01/04/2017 CLINICAL DATA:  Abdominal pain with no bowel movement for 3 days. History of CHF, chronic renal insufficiency, appendectomy, hysterectomy, and cholecystectomy. EXAM: CT ABDOMEN AND PELVIS WITH CONTRAST TECHNIQUE: Multidetector CT imaging of the abdomen and pelvis was performed using the standard protocol following bolus administration of intravenous contrast. CONTRAST:  1 ISOVUE-300 IOPAMIDOL (ISOVUE-300) INJECTION 61%, 75mL ISOVUE-300 IOPAMIDOL (ISOVUE-300) INJECTION 61% COMPARISON:  September 18, 2016 FINDINGS: Lower chest: Bilateral pleural effusions with underlying atelectasis, right greater than left. No other abnormalities identified in the lung bases. Hepatobiliary: The  patient is status post cholecystectomy with intra and extrahepatic biliary duct prominence/dilatation unchanged since October 2017. No acute abnormalities are seen in the liver. The portal veins are normal in appearance. Pancreas: The pancreas is normal in appearance. Spleen: Spleen is normal. Adrenals/Urinary Tract: The adrenal glands are normal. No renal stones, masses, or hydronephrosis. No ureterectasis or ureteral stones. The bladder is unremarkable. Stomach/Bowel: The stomach and small bowel are normal. Marked fecal loading is seen in the colon. There is a large stool ball in the rectum with mild wall thickening. It is difficult to assess the adjacent fat given ascites which will be described below. There is no pneumatosis. The fecal loading extends from the distal descending colon into the rectum. A short segment of proximal ascending colon is difficult to evaluate due to poor distention. However, no abnormality was seen in this region in October 2017 and the finding is likely due to peristalsis. Vascular/Lymphatic: Atherosclerotic changes seen in the non aneurysmal aorta, iliac vessels, and femoral vessels. No adenopathy. Reproductive: The patient is status post hysterectomy. Other: Increased attenuation in the subcutaneous fat diffusely and in the mesenteries consistent with volume overload. There is moderate to severe ascites. Musculoskeletal: No acute or significant osseous findings. IMPRESSION: 1. Moderate bilateral pleural effusions with underlying atelectasis. Increased attenuation in the subcutaneous fat diffusely consistent with edema. Moderate to severe ascites. 2. Large  stool ball in the rectum. There is mild associated wall thickening and the patient is at risk for stercoral colitis. Fecal loading extending from the distal descending colon into the rectum. 3. Atherosclerosis in the abdominal aorta. Electronically Signed   By: Gerome Samavid  Williams III M.D   On: 01/04/2017 18:08   Dg Abd 2  Views  Result Date: 01/04/2017 CLINICAL DATA:  Generalized abdominal pain.  No bowel movement. EXAM: ABDOMEN - 2 VIEW COMPARISON:  None. FINDINGS: Mild opacity is not excluded in the right base. Left lung base is normal. No free air, portal venous gas, or pneumatosis. No renal or ureteral stones. Cholecystectomy clips. Fecal loading in the rectum. Nonobstructive bowel gas pattern. IMPRESSION: 1. Mild infiltrate is not excluded in the right lung base. Recommend a PA and lateral chest x-ray for better evaluation. 2. Fecal loading in the rectum.  No other abdominal abnormalities. Electronically Signed   By: Gerome Samavid  Williams III M.D   On: 01/04/2017 15:39   ASSESSMENT AND PLAN:   Active Problems:   Fecal impaction (HCC)   Constipation   Generalized abdominal pain  # Fecal impaction: Continue stool softeners, soap suds enema.at risk for stercoral ulcer.  # patient has moderate to severe ascites by CAT scan:  Seen by gastroenterology. Paracentesis today Start lasix Likely due to hypoalbuminemia  # chronic anemia Likely combination of B12 def and AOCD  # Diabetes Mellitus type 2;  Intermittent nausea vomiting; h/o diabetic gastroparesis started on Reglan.  # hypothyroidism TSH elevated significantly Patient compliant with meds. Increase dose  # Hypokalemia: Replace the potassium.  All the records are reviewed and case discussed with Care Management/Social Workerr. Management plans discussed with the patient, family and they are in agreement.  CODE STATUS: full  TOTAL TIME TAKING CARE OF THIS PATIENT: 35 minutes.   POSSIBLE D/C IN 1-2DAYS, DEPENDING ON CLINICAL CONDITION.   Milagros LollSudini, Tye Vigo R M.D on 01/06/2017 at 2:29 PM  Between 7am to 6pm - Pager - [phone]  After 6pm go to [email or web address] - password [email or web address] [facility]  [facility]  Office  [phone]  CC: Primary care physician; Irving CopasHACKER,ROBERT KELLER, MD

## 2017-01-06 NOTE — Progress Notes (Signed)
Inpatient Diabetes Program Recommendations  AACE/ADA: New Consensus Statement on Inpatient Glycemic Control (2015)  Target Ranges:  Prepandial:   less than 140 mg/dL      Peak postprandial:   less than 180 mg/dL (1-2 hours)      Critically ill patients:  140 - 180 mg/dL   [name] (MRN [id number]) as of 01/06/2017 09:07  Ref. Range 01/05/2017 08:21 01/05/2017 11:35 01/05/2017 16:27 01/05/2017 20:38 01/05/2017 22:40 01/05/2017 23:55  Glucose-Capillary Latest Ref Range: 65 - 99 mg/dL 191221 (H) 478225 (H) 295189 (H) 103 (H) 66 91   Results for Nastasi, Cassandra [name] (MRN [id number]) as of 01/06/2017 09:07  Ref. Range 01/06/2017 07:49  Glucose-Capillary Latest Ref Range: 65 - 99 mg/dL 846221 (H)    Admit with: Fecal Impaction  History: DM, CKD  Home DM Meds: Lantus 4 units QHS  Current Insulin Orders: Lantus 4 units QHS      Novolog Sensitive Correction Scale/ SSI (0-9 units) TID AC + HS     MD- Lantus insulin was held last PM by RN due to Hypoglycemia at bedtime (CBG at bedtime was 66 mg/dl).    Please consider changing timing of Lantus to daily in the AM so patient will get her dose of Lantus today.  Also, please consider changing Novolog SSI to more Sensitive scale.  Patient noted to have issues with Hypoglycemia from previous admissions:  150-200 mg/dl- 1 unit [phone] mg/dl- 2 units [id number] mg/dl- 3 units [phone] mg/dl- 4 units [id number] mg/dl- 5 units    --Will follow patient during hospitalization--  Ambrose FinlandJeannine Johnston Josaiah Muhammed RN, MSN, CDE Diabetes Coordinator Inpatient Glycemic Control Team Team Pager: [phone] (8a-5p)

## 2017-01-06 NOTE — Progress Notes (Signed)
Monitored blood sugar to ensure blood sugar is wnl. POCT CBG taken and result 185. Will continue to monitor to end of shift.

## 2017-01-06 NOTE — Progress Notes (Signed)
Jonathon Bellows MD [address] [place], [place] [address] Phone: [phone] Fax : [phone]  Cassandra [name] is being followed for anemia  Day 2 of follow up   Subjective: Denies any complaints, no hematemesis/melena   Objective: Vital signs in last 24 hours: Vitals:   01/05/17 1446 01/05/17 2036 01/06/17 0508 01/06/17 0750  BP: 130/65 (!) 123/53 (!) 149/65 (!) 151/65  Pulse: 68 89 74 79  Resp: '14 18 16 17  '$ Temp: 98.3 F (36.8 C) 98.6 F (37 C) 98.1 F (36.7 C) 98 F (36.7 C)  TempSrc: Oral Oral Oral Oral  SpO2: 94% 94% 97% 97%  Weight:      Height:       Weight change:   Intake/Output Summary (Last 24 hours) at 01/06/17 6578 Last data filed at 01/05/17 2331  Gross per 24 hour  Intake             1496 ml  Output                0 ml  Net             1496 ml     Exam: Heart:: Regular rate and rhythm, S1S2 present or without murmur or extra heart sounds Lungs: normal, clear to auscultation and clear to auscultation and percussion Abdomen: soft, nontender, normal bowel sounds   Lab Results: '@LABTEST2'$ @ Micro Results: No results found for this or any previous visit (from the past 240 hour(s)). Studies/Results: Dg Chest 2 View  Result Date: 01/04/2017 CLINICAL DATA:  Recent pneumonia EXAM: CHEST  2 VIEW COMPARISON:  12/24/2016 FINDINGS: Cardiomediastinal silhouette is stable. There is small right pleural effusion with right base posterior atelectasis. No infiltrate or pulmonary edema. Mild degenerative changes thoracic spine. Mild degenerative changes bilateral shoulders. Mild thoracic dextroscoliosis. IMPRESSION: Small right pleural effusion with right base atelectasis. No infiltrate or pulmonary edema. Degenerative changes thoracic spine and bilateral shoulders. Electronically Signed   By: Lahoma Crocker M.D.   On: 01/04/2017 17:00   Ct Abdomen Pelvis W Contrast  Result Date: 01/04/2017 CLINICAL DATA:  Abdominal pain with no bowel movement for 3 days.  History of CHF, chronic renal insufficiency, appendectomy, hysterectomy, and cholecystectomy. EXAM: CT ABDOMEN AND PELVIS WITH CONTRAST TECHNIQUE: Multidetector CT imaging of the abdomen and pelvis was performed using the standard protocol following bolus administration of intravenous contrast. CONTRAST:  1 ISOVUE-300 IOPAMIDOL (ISOVUE-300) INJECTION 61%, 30m ISOVUE-300 IOPAMIDOL (ISOVUE-300) INJECTION 61% COMPARISON:  September 18, 2016 FINDINGS: Lower chest: Bilateral pleural effusions with underlying atelectasis, right greater than left. No other abnormalities identified in the lung bases. Hepatobiliary: The patient is status post cholecystectomy with intra and extrahepatic biliary duct prominence/dilatation unchanged since October 2017. No acute abnormalities are seen in the liver. The portal veins are normal in appearance. Pancreas: The pancreas is normal in appearance. Spleen: Spleen is normal. Adrenals/Urinary Tract: The adrenal glands are normal. No renal stones, masses, or hydronephrosis. No ureterectasis or ureteral stones. The bladder is unremarkable. Stomach/Bowel: The stomach and small bowel are normal. Marked fecal loading is seen in the colon. There is a large stool ball in the rectum with mild wall thickening. It is difficult to assess the adjacent fat given ascites which will be described below. There is no pneumatosis. The fecal loading extends from the distal descending colon into the rectum. A short segment of proximal ascending colon is difficult to evaluate due to poor distention. However, no abnormality was seen in this region in October 2017  and the finding is likely due to peristalsis. Vascular/Lymphatic: Atherosclerotic changes seen in the non aneurysmal aorta, iliac vessels, and femoral vessels. No adenopathy. Reproductive: The patient is status post hysterectomy. Other: Increased attenuation in the subcutaneous fat diffusely and in the mesenteries consistent with volume overload. There is  moderate to severe ascites. Musculoskeletal: No acute or significant osseous findings. IMPRESSION: 1. Moderate bilateral pleural effusions with underlying atelectasis. Increased attenuation in the subcutaneous fat diffusely consistent with edema. Moderate to severe ascites. 2. Large stool ball in the rectum. There is mild associated wall thickening and the patient is at risk for stercoral colitis. Fecal loading extending from the distal descending colon into the rectum. 3. Atherosclerosis in the abdominal aorta. Electronically Signed   By: Dorise Bullion III M.D   On: 01/04/2017 18:08   Dg Abd 2 Views  Result Date: 01/04/2017 CLINICAL DATA:  Generalized abdominal pain.  No bowel movement. EXAM: ABDOMEN - 2 VIEW COMPARISON:  None. FINDINGS: Mild opacity is not excluded in the right base. Left lung base is normal. No free air, portal venous gas, or pneumatosis. No renal or ureteral stones. Cholecystectomy clips. Fecal loading in the rectum. Nonobstructive bowel gas pattern. IMPRESSION: 1. Mild infiltrate is not excluded in the right lung base. Recommend a PA and lateral chest x-ray for better evaluation. 2. Fecal loading in the rectum.  No other abdominal abnormalities. Electronically Signed   By: Dorise Bullion III M.D   On: 01/04/2017 15:39   Medications: I have reviewed the patient's current medications. Scheduled Meds: . amLODipine  5 mg Oral Daily  . atorvastatin  10 mg Oral q1800  . carvedilol  3.125 mg Oral BID WC  . cyanocobalamin  1,000 mcg Subcutaneous Daily  . enoxaparin (LOVENOX) injection  40 mg Subcutaneous QHS  . escitalopram  15 mg Oral Daily  . insulin aspart  0-5 Units Subcutaneous QHS  . insulin aspart  0-9 Units Subcutaneous TID WC  . insulin glargine  4 Units Subcutaneous Q2200  . levothyroxine  112 mcg Oral QAC breakfast  . metoCLOPramide (REGLAN) injection  5 mg Intravenous Q12H  . pantoprazole  40 mg Oral Daily  . polyethylene glycol  17 g Oral Daily   Continuous  Infusions: PRN Meds:.albuterol, ondansetron **OR** ondansetron (ZOFRAN) IV CBC Latest Ref Rng & Units 01/05/2017 01/04/2017 12/28/2016  WBC 3.6 - 11.0 K/uL 9.3 12.2(H) 6.4  Hemoglobin 12.0 - 16.0 g/dL 7.8(L) 12.1 8.0(L)  Hematocrit 35.0 - 47.0 % 22.1(L) 34.4(L) 23.6(L)  Platelets 150 - 440 K/uL 332 485(H) 289   Hepatic Function Latest Ref Rng & Units 01/04/2017 12/24/2016 12/23/2016  Total Protein 6.5 - 8.1 g/dL 6.6 3.9(L) 5.1(L)  Albumin 3.5 - 5.0 g/dL 2.5(L) 1.5(L) 1.8(L)  AST 15 - 41 U/L 45(H) 23 31  ALT 14 - 54 U/L '24 16 21  '$ Alk Phosphatase 38 - 126 U/L 131(H) 91 112  Total Bilirubin 0.3 - 1.2 mg/dL 0.7 0.4 0.5  Bilirubin, Direct 0.1 - 0.5 mg/dL - - -    BMP Latest Ref Rng & Units 01/05/2017 01/05/2017 01/04/2017  Glucose 65 - 99 mg/dL 178(H) 183(H) 267(H)  BUN 6 - 20 mg/dL '17 18 17  '$ Creatinine 0.44 - 1.00 mg/dL 1.28(H) 1.26(H) 1.27(H)  Sodium 135 - 145 mmol/L 140 139 138  Potassium 3.5 - 5.1 mmol/L 3.0(L) 3.0(L) 3.3(L)  Chloride 101 - 111 mmol/L 111 110 106  CO2 22 - 32 mmol/L 23 24 19(L)  Calcium 8.9 - 10.3 mg/dL 7.1(L) 7.2(L) 8.6(L)  Assessment: Active Problems:   Fecal impaction (HCC)   Constipation   Generalized abdominal pain  Admitted on 1/201/18 for abdominal pain secondary to fecal impaction , on plavix for stroke. On admission Hb 7.8 . Moderate to severe ascites , moderate b/l pleural effusions seen on Ct abdomen . MRCP 10/2016-CBD 11 mm. B12 borderline normal low, Iron studies normal while on iron . Today she mentions she consumed 3-4 drinks of alcohol daily for many years. Denies liver disease in the family . Does mention abdominal distension last few weeks and on and off shortness of breath    Plan: 1. Anemia -Normocytic- Borderline low b12 levels- Check Homocysteine, MMA, TSH,Celiac serology - Will need EGD/Colonoscopy when pleural effusions are cleared and safe for anesthesia - if not bleeding acutely can be done as an outpatient once effusions are resolved and will  need to be off plavix for 5 days.   2. Ascites:  Low albumin , No INR on chart, normal platelet- ?? Chronic liver disease - Will need diagnostic and therapeutic paracentesis with fluid sent for analysis to determine etiology of ascites - Out patient evaluation for viral and autoimmune hepatitis.      LOS: 0 days   Jonathon Bellows 01/06/2017, 9:03 AM

## 2017-01-06 NOTE — Progress Notes (Signed)
Notified Dr. Elpidio AnisSudini that patient's potassium is 3.3 and asked for a supplement.  Also that repeat hemoglobin this am is 7.9.  Stated that per the daughter- patient is non-compliant with medicine and taking care of herself and she is concerned about having EGD/colonoscopy being performed outpatient vs. While in the hospital- because she states that the patient will probably not go if she were at home and went to outpatient procedure.

## 2017-01-07 ENCOUNTER — Observation Stay: Payer: Medicare Other

## 2017-01-07 DIAGNOSIS — K5900 Constipation, unspecified: Secondary | ICD-10-CM | POA: Diagnosis not present

## 2017-01-07 DIAGNOSIS — D649 Anemia, unspecified: Secondary | ICD-10-CM | POA: Diagnosis not present

## 2017-01-07 DIAGNOSIS — R188 Other ascites: Secondary | ICD-10-CM | POA: Diagnosis not present

## 2017-01-07 LAB — MISC LABCORP TEST (SEND OUT): LABCORP TEST CODE: 19588

## 2017-01-07 LAB — GLUCOSE, CAPILLARY
GLUCOSE-CAPILLARY: 232 mg/dL — AB (ref 65–99)
GLUCOSE-CAPILLARY: 241 mg/dL — AB (ref 65–99)
Glucose-Capillary: 185 mg/dL — ABNORMAL HIGH (ref 65–99)
Glucose-Capillary: 229 mg/dL — ABNORMAL HIGH (ref 65–99)
Glucose-Capillary: 250 mg/dL — ABNORMAL HIGH (ref 65–99)

## 2017-01-07 LAB — CBC WITH DIFFERENTIAL/PLATELET
BASOS PCT: 1 %
Basophils Absolute: 0 10*3/uL (ref 0–0.1)
Eosinophils Absolute: 0.4 10*3/uL (ref 0–0.7)
Eosinophils Relative: 6 %
HEMATOCRIT: 21.9 % — AB (ref 35.0–47.0)
Hemoglobin: 7.6 g/dL — ABNORMAL LOW (ref 12.0–16.0)
Lymphocytes Relative: 22 %
Lymphs Abs: 1.4 10*3/uL (ref 1.0–3.6)
MCH: 31.3 pg (ref 26.0–34.0)
MCHC: 34.9 g/dL (ref 32.0–36.0)
MCV: 89.6 fL (ref 80.0–100.0)
MONO ABS: 0.5 10*3/uL (ref 0.2–0.9)
MONOS PCT: 8 %
Neutro Abs: 4.1 10*3/uL (ref 1.4–6.5)
Neutrophils Relative %: 63 %
Platelets: 329 10*3/uL (ref 150–440)
RBC: 2.44 MIL/uL — ABNORMAL LOW (ref 3.80–5.20)
RDW: 14 % (ref 11.5–14.5)
WBC: 6.3 10*3/uL (ref 3.6–11.0)

## 2017-01-07 LAB — BASIC METABOLIC PANEL
Anion gap: 8 (ref 5–15)
BUN: 21 mg/dL — AB (ref 6–20)
CALCIUM: 7.1 mg/dL — AB (ref 8.9–10.3)
CO2: 22 mmol/L (ref 22–32)
CREATININE: 1.73 mg/dL — AB (ref 0.44–1.00)
Chloride: 109 mmol/L (ref 101–111)
GFR calc Af Amer: 34 mL/min — ABNORMAL LOW (ref >60)
GFR calc non Af Amer: 29 mL/min — ABNORMAL LOW (ref >60)
GLUCOSE: 248 mg/dL — AB (ref 65–99)
Potassium: 4.5 mmol/L (ref 3.5–5.1)
Sodium: 139 mmol/L (ref 135–145)

## 2017-01-07 LAB — PH, BODY FLUID: PH, BODY FLUID: 8

## 2017-01-07 LAB — HOMOCYSTEINE: HOMOCYSTEINE-NORM: 10.7 umol/L (ref 0.0–15.0)

## 2017-01-07 LAB — MAGNESIUM: Magnesium: 1.9 mg/dL (ref 1.7–2.4)

## 2017-01-07 LAB — FOLATE RBC
FOLATE, RBC: 1615 ng/mL (ref >498)
Folate, Hemolysate: 400.4 ng/mL
Hematocrit: 24.8 % — ABNORMAL LOW (ref 34.0–46.6)

## 2017-01-07 MED ORDER — POLYETHYLENE GLYCOL 3350 17 G PO PACK: 17.0000 g | PACK | Freq: Every day | ORAL | 0 refills | Status: DC | PRN

## 2017-01-07 MED ORDER — SPIRONOLACTONE 25 MG PO TABS
50.0000 mg | ORAL_TABLET | Freq: Once | ORAL | Status: AC
  Administered 2017-01-07: 50 mg via ORAL
  Filled 2017-01-07: qty 2

## 2017-01-07 MED ORDER — SENNOSIDES-DOCUSATE SODIUM 8.6-50 MG PO TABS: 2.0000 | ORAL_TABLET | Freq: Two times a day (BID) | ORAL | 0 refills | Status: DC

## 2017-01-07 MED ORDER — SENNOSIDES-DOCUSATE SODIUM 8.6-50 MG PO TABS
2.0000 | ORAL_TABLET | Freq: Two times a day (BID) | ORAL | Status: DC
  Administered 2017-01-07 – 2017-01-08 (×3): 2 via ORAL
  Filled 2017-01-07 (×3): qty 2

## 2017-01-07 MED ORDER — PANTOPRAZOLE SODIUM 40 MG PO TBEC: 40.0000 mg | DELAYED_RELEASE_TABLET | Freq: Every day | ORAL | 0 refills | Status: DC

## 2017-01-07 MED ORDER — LEVOTHYROXINE SODIUM 175 MCG PO TABS: 175.0000 ug | ORAL_TABLET | Freq: Every day | ORAL | 0 refills | Status: AC

## 2017-01-07 MED ORDER — CYANOCOBALAMIN 1000 MCG/ML IJ SOLN: 1000.0000 ug | INTRAMUSCULAR | 0 refills | Status: DC

## 2017-01-07 MED ORDER — ALBUMIN HUMAN 5 % IV SOLN
25.0000 g | Freq: Once | INTRAVENOUS | Status: AC
  Administered 2017-01-08: 25 g via INTRAVENOUS
  Filled 2017-01-07: qty 500

## 2017-01-07 MED ORDER — LACTULOSE 10 GM/15ML PO SOLN
30.0000 g | Freq: Once | ORAL | Status: AC
  Administered 2017-01-07: 30 g via ORAL
  Filled 2017-01-07: qty 60

## 2017-01-07 MED ORDER — MAGNESIUM HYDROXIDE 400 MG/5ML PO SUSP: 30.0000 mL | Freq: Every day | ORAL | Status: DC

## 2017-01-07 MED ORDER — FUROSEMIDE 40 MG PO TABS
40.0000 mg | ORAL_TABLET | Freq: Once | ORAL | Status: AC
  Administered 2017-01-08: 40 mg via ORAL
  Filled 2017-01-07: qty 1

## 2017-01-07 NOTE — Progress Notes (Signed)
Patient called for nurse to com to room for she states she is nauseous. Assisted patient to side of the bed. Patient states she was dizzy for a brief moment but it subside. Patient dry heaved x1 but then patient states her nauseous feeling was beginning to subside but was not sure.. Administered zofran per PRN order. Patient was wet but moisture was unclear from where it came from for the pad was not wet. Assisted patient to the bedside commode and bath was given. Changed gown and changed linen. Patient states she feels much better after assisting her back to bed.   Noticed belly is more tight than at the beginning of shift. Notified on-call hospitalist. Orders given for X-ray of the abdomen. Patient complains of tightness in abdomen but no pain but is uncomfortable. Will continue to monitor patient to end of shift.

## 2017-01-07 NOTE — Progress Notes (Signed)
[facility] [place] [facility]   PATIENT NAME: Cassandra [name]    MR#:  [id number]  DATE OF BIRTH:  Mar 12, 1946  SUBJECTIVE:   CHIEF COMPLAINT:   Chief Complaint  Patient presents with  . Abdominal Pain   No vomiting.  REVIEW OF SYSTEMS:    Review of Systems  Constitutional: Negative for chills and fever.  HENT: Negative for hearing loss.   Eyes: Negative for blurred vision, double vision and photophobia.  Respiratory: Negative for cough, hemoptysis and shortness of breath.   Cardiovascular: Negative for palpitations, orthopnea and leg swelling.  Gastrointestinal: Positive for constipation. Negative for abdominal pain, blood in stool, diarrhea and vomiting.  Genitourinary: Negative for dysuria and urgency.  Musculoskeletal: Negative for myalgias and neck pain.  Skin: Negative for rash.  Neurological: Negative for dizziness, focal weakness, seizures, weakness and headaches.  Psychiatric/Behavioral: Negative for memory loss. The patient does not have insomnia.    DRUG ALLERGIES:   Allergies  Allergen Reactions  . Alprazolam Other (See Comments)    Family preference, for patient to not take med  . Percocet [Oxycodone-Acetaminophen] Other (See Comments)    Family preference, for patient to not take med  . Codeine Diarrhea and Nausea And Vomiting  . Doxycycline Diarrhea and Nausea And Vomiting  . Hydrocodone Nausea And Vomiting  . Omnicef [Cefdinir] Nausea Only and Other (See Comments)    Constipation, tolerated Zosyn  . Tramadol Other (See Comments)    Pt hallucinates when taking medication. Family request not to give medication to pt   . Augmentin [Amoxicillin-Pot Clavulanate] Hives and Rash    Has patient had a PCN reaction causing immediate rash, facial/tongue/throat swelling, SOB or lightheadedness with hypotension: Yes Has patient had a PCN reaction causing severe rash involving mucus membranes or skin necrosis: Yes Did a PCN reaction that  required hospitalization No Did PCN reaction occurring within the last 10 years: Yes If all of the above answers are "NO", then may proceed with Cephalosporin use.  Pt states she has taken penicillin since, and was ok with it...  . Ciprofloxacin Hives    Tolerated LVQ in 12/2016    VITALS:  Blood pressure (!) 158/67, pulse 69, temperature 98.3 F (36.8 C), temperature source Oral, resp. rate 16, height 5\' 2"  (1.575 m), weight 55.3 kg (121 lb 14.4 oz), SpO2 96 %.  PHYSICAL EXAMINATION:   Physical Exam  GENERAL:  71 y.o.-year-old patient lying in the bed with no acute distress. Appears.appears pale,.  EYES: Pupils equal, round, reactive to light and accommodation. No scleral icterus. Extraocular muscles intact.  HEENT: Head atraumatic, normocephalic. Oropharynx and nasopharynx clear.  NECK:  Supple, no jugular venous distention. No thyroid enlargement, no tenderness.  LUNGS: Normal breath sounds bilaterally, no wheezing, rales,rhonchi or crepitation. No use of accessory muscles of respiration.  CARDIOVASCULAR: S1, S2 normal. No murmurs, rubs, or gallops.  ABDOMEN: Soft, nontender, nondistended. Bowel sounds present. No organomegaly or mass.  EXTREMITIES: No pedal edema, cyanosis, or clubbing.  NEUROLOGIC: Cranial nerves II through XII are intact. Muscle strength 5/5 in all extremities. Sensation intact. Gait not checked.  PSYCHIATRIC: The patient is alert and oriented SKIN: No obvious rash, lesion, or ulcer.   LABORATORY PANEL:   CBC  Recent Labs Lab 01/07/17 0507  WBC 6.3  HGB 7.6*  HCT 21.9*  PLT 329   ------------------------------------------------------------------------------------------------------------------  Chemistries   Recent Labs Lab 01/04/17 1348  01/06/17 1538 01/07/17 0507  NA 138  < >  --  139  K 3.3*  < >  --  4.5  CL 106  < >  --  109  CO2 19*  < >  --  22  GLUCOSE 267*  < >  --  248*  BUN 17  < >  --  21*  CREATININE 1.27*  < >  --  1.73*   CALCIUM 8.6*  < >  --  7.1*  MG  --   < >  --  1.9  AST 45*  --   --   --   ALT 24  --   --   --   ALKPHOS 131*  --   --   --   BILITOT 0.7  --  0.2*  --   < > = values in this interval not displayed. ------------------------------------------------------------------------------------------------------------------  Cardiac Enzymes No results for input(s): TROPONINI in the last 168 hours. ------------------------------------------------------------------------------------------------------------------  RADIOLOGY:  Dg Chest Bilateral Decubitus  Result Date: 01/07/2017 CLINICAL DATA:  Pleural effusion. EXAM: CHEST - BILATERAL DECUBITUS VIEW COMPARISON:  01/04/2017. FINDINGS: New bilateral layering small moderate pleural effusions are noted. Heart size normal. Prior cervical spine fusion . IMPRESSION: Bilateral layering small to moderate pleural effusions. Electronically Signed   By: Maisie Fus  Register   On: 01/07/2017 15:04   US Paracentesis  Result Date: 01/06/2017 INDICATION: Ascites and need for diagnostic paracentesis. EXAM: ULTRASOUND GUIDED PARACENTESIS MEDICATIONS: None. COMPLICATIONS: None immediate. PROCEDURE: Informed written consent was obtained from the patient after a discussion of the risks, benefits and alternatives to treatment. A timeout was performed prior to the initiation of the procedure. Initial ultrasound was used to localize ascites. The right lower abdomen was prepped and draped in the usual sterile fashion. 1% lidocaine was used for local anesthesia. Following this, a 6 Fr Safe-T-Centesis catheter was introduced. Real-time direct ultrasound guidance was utilized to watch the catheter enter the peritoneal cavity. An ultrasound image was saved for documentation purposes. The paracentesis was performed. The catheter was removed and a dressing was applied. The patient tolerated the procedure well without immediate post procedural complication. FINDINGS: A total of approximately  90 mL of clear, yellow fluid was removed. Samples were sent to the laboratory as requested by the clinical team. IMPRESSION: Successful ultrasound-guided paracentesis yielding 90 mL of peritoneal fluid. Electronically Signed   By: Irish Lack M.D.   On: 01/06/2017 15:50   Dg Abd Portable 2v  Result Date: 01/07/2017 CLINICAL DATA:  71 year old female with nausea vomiting and abdominal distention. EXAM: PORTABLE ABDOMEN - 2 VIEW COMPARISON:  CT of the abdomen pelvis dated 01/04/2017 abdominal ultrasound dated 01/06/2017 FINDINGS: Moderate amount of stool noted throughout the colon. No bowel dilatation or evidence of obstruction. There is silhouetting of the abdominal organs on likely related to ascites. No free air identified. Right upper quadrant cholecystectomy clips noted. There is degenerative changes of the osseous structures. Small left pleural effusion and left lung base atelectasis versus infiltrate. IMPRESSION: Moderate stool within the colon.  No bowel obstruction. Probable small ascites. Small left pleural effusion and left lung base atelectasis versus infiltrate. Electronically Signed   By: Elgie Collard M.D.   On: 01/07/2017 05:06   ASSESSMENT AND PLAN:   Active Problems:   Fecal impaction (HCC)   Constipation   Generalized abdominal pain  # Fecal impaction: Continue stool softeners  # patient has moderate to severe ascites by CAT scan:  Seen by gastroenterology. Paracentesis showed transudate Started lasix Likely due to hypoalbuminemia and hypothyroidism. GI has ordered Abd  dopplers  # chronic anemia Likely combination of B12 def and AOCD  # Diabetes Mellitus type 2;  Intermittent nausea vomiting; h/o diabetic gastroparesis started on Reglan.  # hypothyroidism TSH elevated significantly Patient compliant with meds. Increased dose  # Hypokalemia: Replace the potassium.  All the records are reviewed and case discussed with Care Management/Social  Workerr. Management plans discussed with the patient, family and they are in agreement.  CODE STATUS: full  TOTAL TIME TAKING CARE OF THIS PATIENT: 35 minutes.   POSSIBLE D/C IN 1-2DAYS, DEPENDING ON CLINICAL CONDITION.   Milagros LollSudini, Eliah Ozawa R M.D on 01/07/2017 at 3:49 PM  Between 7am to 6pm - Pager - [phone]  After 6pm go to [email or web address] - password [email or web address] [facility]  [address] [facility]  Office  [phone]  CC: Primary care physician; Irving CopasHACKER,ROBERT KELLER, MD

## 2017-01-07 NOTE — Progress Notes (Signed)
Cassandra MoodKiran Ligaya Cormier MD [address] [place], [place] [address] Phone: [phone] Fax : [phone]  Cassandra [name] is being followed for anemia,ascites  Day 3 of follow up   Subjective: No complaints , no shortness of breath    Objective: Vital signs in last 24 hours: Vitals:   01/06/17 1938 01/07/17 0333 01/07/17 0608 01/07/17 0753  BP: (!) 136/57 136/78 (!) 156/85 (!) 158/67  Pulse: 71 60 65 69  Resp: 17  17 16   Temp: 99.1 F (37.3 C) 98.1 F (36.7 C) 98.2 F (36.8 C) 98.3 F (36.8 C)  TempSrc: Oral Oral Oral Oral  SpO2: 96%  96% 96%  Weight:      Height:       Weight change:  No intake or output data in the 24 hours ending 01/07/17 1128   Exam: Heart:: Regular rate and rhythm or without murmur or extra heart sounds Lungs: normal and clear to auscultation Abdomen: soft, nontender, normal bowel sounds   Lab Results: @LABTEST2 @ Micro Results: Recent Results (from the past 240 hour(s))  Body fluid culture     Status: None (Preliminary result)   Collection Time: 01/06/17  2:35 PM  Result Value Ref Range Status   Specimen Description PERITONEAL  Final   Special Requests NONE  Final   Gram Stain   Final    MODERATE WBC PRESENT, PREDOMINANTLY MONONUCLEAR NO ORGANISMS SEEN    Culture   Final    NO GROWTH < 12 HOURS Performed at [facility], [address] [place], [place] [address]    Report Status PENDING  Incomplete   Studies/Results: [place] Paracentesis  Result Date: 01/06/2017 INDICATION: Ascites and need for diagnostic paracentesis. EXAM: ULTRASOUND GUIDED PARACENTESIS MEDICATIONS: None. COMPLICATIONS: None immediate. PROCEDURE: Informed written consent was obtained from the patient after a discussion of the risks, benefits and alternatives to treatment. A timeout was performed prior to the initiation of the procedure. Initial ultrasound was used to localize ascites. The right lower abdomen was prepped and draped in the usual sterile fashion. 1%  lidocaine was used for local anesthesia. Following this, a 6 Fr Safe-T-Centesis catheter was introduced. Real-time direct ultrasound guidance was utilized to watch the catheter enter the peritoneal cavity. An ultrasound image was saved for documentation purposes. The paracentesis was performed. The catheter was removed and a dressing was applied. The patient tolerated the procedure well without immediate post procedural complication. FINDINGS: A total of approximately 90 mL of clear, yellow fluid was removed. Samples were sent to the laboratory as requested by the clinical team. IMPRESSION: Successful ultrasound-guided paracentesis yielding 90 mL of peritoneal fluid. Electronically Signed   By: Irish LackGlenn  Yamagata M.D.   On: 01/06/2017 15:50   Dg Abd Portable 2v  Result Date: 01/07/2017 CLINICAL DATA:  71 year old female with nausea vomiting and abdominal distention. EXAM: PORTABLE ABDOMEN - 2 VIEW COMPARISON:  CT of the abdomen pelvis dated 01/04/2017 abdominal ultrasound dated 01/06/2017 FINDINGS: Moderate amount of stool noted throughout the colon. No bowel dilatation or evidence of obstruction. There is silhouetting of the abdominal organs on likely related to ascites. No free air identified. Right upper quadrant cholecystectomy clips noted. There is degenerative changes of the osseous structures. Small left pleural effusion and left lung base atelectasis versus infiltrate. IMPRESSION: Moderate stool within the colon.  No bowel obstruction. Probable small ascites. Small left pleural effusion and left lung base atelectasis versus infiltrate. Electronically Signed   By: Elgie CollardArash  Radparvar M.D.   On: 01/07/2017 05:06  Medications: I have reviewed the patient's current medications. Scheduled Meds: . amLODipine  5 mg Oral Daily  . atorvastatin  10 mg Oral q1800  . bisacodyl  10 mg Rectal Once  . carvedilol  3.125 mg Oral BID WC  . cyanocobalamin  1,000 mcg Subcutaneous Daily  . enoxaparin (LOVENOX) injection   40 mg Subcutaneous QHS  . escitalopram  15 mg Oral Daily  . insulin aspart  0-5 Units Subcutaneous QHS  . insulin aspart  0-9 Units Subcutaneous TID WC  . insulin glargine  4 Units Subcutaneous Q2200  . lactulose  30 g Oral Once  . levothyroxine  200 mcg Oral QAC breakfast  . metoCLOPramide (REGLAN) injection  5 mg Intravenous Q12H  . pantoprazole  40 mg Oral Daily  . polyethylene glycol  17 g Oral Daily  . senna-docusate  2 tablet Oral BID   Continuous Infusions: PRN Meds:.albuterol, ondansetron **OR** ondansetron (ZOFRAN) IV   Assessment: Active Problems:   Fecal impaction (HCC)   Constipation   Generalized abdominal pain  Admitted on 1/201/18 for abdominal pain secondary to fecal impaction , on plavix for stroke. On admission Hb 7.8 . Moderate to severe ascites , moderate b/l pleural effusions seen on Ct abdomen . MRCP 10/2016-CBD 11 mm. B12 borderline normal low, Iron studies normal while on iron . Consumed  3-4 drinks of alcohol daily for many years. Denies liver disease in the family . Does mention abdominal distension last few weeks and on and off shortness of breath    Plan: 1. Anemia -Normocytic- Borderline low b12 levels- F/u  Homocysteine, folate,MMA, ,Celiac serology - Will need EGD/Colonoscopy when pleural effusions are cleared and safe for anesthesia - if not bleeding acutely can be done as an outpatient once effusions are resolved and will need to be off plavix for 5 days. Suggest IM vitamin B 12 (retic count low may be due to low b12). Hb has been stable. Get Cxr to determine if plural effusions have resolved.   2. Ascites:  severe hypothyroidism (may be underlying cause) Low albumin ,  INR 0.96 , normal platelet.No SBP,  Culture negative- SAAG >1.1, total protein < 3 , suggestive of portal hypertension , check USG doppler to r/o thrombosis , commence on lasix 40 mg and aldactone 100 mg with BMP being monitored after AKI has resolved , If need to use IV fluids suggest  instead to use albumin which will help the pressures and also avoid ascites. Low salt diet . Avoid NSAID's    Out patient evaluation for viral and autoimmune hepatitis.   3. Constipation/fecal impaction likely due to hypothyroidism - suggest daily miralax.    LOS: 0 days   Cassandra [name] 01/07/2017, 11:28 AM

## 2017-01-07 NOTE — Plan of Care (Signed)
Problem: Physical Regulation: Goal: Will remain free from infection Outcome: Progressing Instructed patient on the need to remain free from infection by washing hands frequently

## 2017-01-07 NOTE — Progress Notes (Signed)
Physical Therapy Treatment Patient Details Name: Cassandra [name] MRN: [id number] DOB: [birth year] Today's Date: 01/07/2017    History of Present Illness Pt is a 71 y/o F who was admitted to [facility] ~2 weeks PTA for acute encephalopathy as well as hypoglycemia.  She was then d/c to a SNF where she stayed until 1/12.  She was home at her daughter's house for ~5 days PTA until she began c/o crampy abdominal pain, generalized weakness, poor po intake.  Pt found to have fecal impaction.  Pt's PMH includes anemia, anxiety, bipolar disorder, cervicalgia, CHF, COPD, CKD, depression, glaucoma, stroke, migraines, ACDF, back surgery, hernia repair.    PT Comments    Cassandra [name] is making modest progress but requires max encouragement to participate in therapy, and extensive education provided on the benefits of mobility.  She demonstrated improved stability with use of RW today and cues were provided for proper technique.  SNF remains most appropriate d/c plan at this time.  Pt will benefit from continued skilled PT services to increase functional independence and safety.   Follow Up Recommendations  SNF;Supervision/Assistance - 24 hour (HHPT if pt refusing SNF)     Equipment Recommendations  None recommended by PT    Recommendations for Other Services       Precautions / Restrictions Precautions Precautions: Fall Restrictions Weight Bearing Restrictions: No    Mobility  Bed Mobility Overal bed mobility: Needs Assistance Bed Mobility: Rolling;Sidelying to Sit Rolling: Min guard Sidelying to sit: Min assist;HOB elevated       General bed mobility comments: Cues for log roll as pt with abdominal pain due to impaction.  Pt uses bed rail to roll.  Min assist to boost from sidelying to sitting to elevate trunk.    Transfers Overall transfer level: Needs assistance Equipment used: Rolling walker (2 wheeled) Transfers: Sit to/from [employer] Sit to Stand: Min guard Stand  pivot transfers: Min assist       General transfer comment: Provided pt with RW for improved stability this session.  Cues for hand placement during sit<>stand and min assist managing RW to turn and pivot to sit on BSC.   Ambulation/Gait Ambulation/Gait assistance: Min guard Ambulation Distance (Feet): 10 Feet Assistive device: Rolling walker (2 wheeled) Gait Pattern/deviations: Trunk flexed;Decreased stride length;Antalgic Gait velocity: decreased Gait velocity interpretation: Below normal speed for age/gender General Gait Details: Pt much more steady with introduction of RW today but requires cues for proper managment with upright posture.  Pt declines ambulating farther in room due to fatigue despite extensive education provided on benefits of mobility.   Stairs            Wheelchair Mobility    Modified Rankin (Stroke Patients Only)       Balance Overall balance assessment: Needs assistance Sitting-balance support: No upper extremity supported;Feet supported Sitting balance-Leahy Scale: Good     Standing balance support: During functional activity;Bilateral upper extremity supported Standing balance-Leahy Scale: Fair Standing balance comment: Pt able to stand statically without UE support and wipe after toileting with min guard assist.                    Cognition Arousal/Alertness: Awake/alert Behavior During Therapy: Flat affect Overall Cognitive Status: No family/caregiver present to determine baseline cognitive functioning Area of Impairment: Orientation Orientation Level: Disoriented to;Time                  Exercises      General Comments General comments (skin  integrity, edema, etc.): [facility] +BM [facility] and reports feeling some abdominal relief.      Pertinent Vitals/Pain Pain Assessment: Faces Faces Pain Scale: Hurts little more Pain Location: abdomen due to gas Pain Descriptors / Indicators: Discomfort Pain  Intervention(s): Limited activity within patient's tolerance;Monitored during session;Repositioned    Home Living                      Prior Function            PT Goals (current goals can now be found in the care plan section) Acute Rehab PT Goals Patient Stated Goal: to leave today with her husband PT Goal Formulation: With patient Time For Goal Achievement: 01/20/17 Potential to Achieve Goals: Fair Progress towards PT goals: Progressing toward goals (with max encouragement )    Frequency    Min 2X/week      PT Plan Current plan remains appropriate    Co-evaluation             End of Session   Activity Tolerance: Patient limited by fatigue;Patient limited by pain Patient left: in chair;with call bell/phone within reach;Other (comment);with nursing/sitter in room (no chair alarm available)     Time: [id number] PT Time Calculation (min) (ACUTE ONLY): 21 min  Charges:  $Therapeutic Activity: 8-22 mins                    G Codes:       Encarnacion ChuAshley Abashian PT, DPT 01/07/2017, 12:29 PM

## 2017-01-07 NOTE — Clinical Social Work Note (Signed)
Clinical Social Work Assessment  Patient Details  Name: Cassandra [name] MRN: [id number] Date of Birth: July 02, 1946  Date of referral:  01/07/17               Reason for consult:  Facility Placement                Permission sought to share information with:  Family Supports Permission granted to share information::  Yes, Verbal Permission Granted  Name::        Agency::     Relationship::     Contact Information:     Housing/Transportation Living arrangements for the past 2 months:  Single Family Home, Skilled Nursing Facility Source of Information:  Patient Patient Interpreter Needed:  None Criminal Activity/Legal Involvement Pertinent to Current Situation/Hospitalization:  No - Comment as needed Significant Relationships:  None Lives with:  Self Do you feel safe going back to the place where you live?  Yes Need for family participation in patient care:  No (Coment)  Care giving concerns:  Patient lives alone and has been in and out of physical therapy rehab several times.   Social Worker assessment / plan: PT has recommended STR for patient. MD has spoken with patient and she has declined rehab. CSW spoke with patient this afternoon and she is aware of the recommendation for STR and she adamantly declines stating she will go no where but home. Patient stated that [facility] has [facility] and another facility she could not remember and she does not want to go again. Patient stated she would be open to home health coming. Patient stated that she does not care if her daughter prefers her going to rehab, she stated that this is her own decision and not her daughters.   Employment status:  Retired Database administratornsurance information:  Managed Medicare PT Recommendations:  Skilled Nursing Facility Information / Referral to community resources:     Patient/Family's Response to care:  Patient expressed appreciation for CSW visit.   Patient/Family's Understanding of and  Emotional Response to Diagnosis, Current Treatment, and Prognosis:  Patient frustrated that it has been recommended that she go to STR again. She is stating that she will go no where but home at this time.  Emotional Assessment Appearance:  Appears stated age Attitude/Demeanor/Rapport:   (pleasant and cooperative) Affect (typically observed):  Frustrated, Pleasant, Appropriate Orientation:  Oriented to Self, Oriented to Place, Oriented to Situation Alcohol / Substance use:  Not Applicable Psych involvement (Current and /or in the community):  No (Comment)  Discharge Needs  Concerns to be addressed:  Care Coordination Readmission within the last 30 days:  No Current discharge risk:  None Barriers to Discharge:  No Barriers Identified   York SpanielMonica Artemio Dobie, LCSW 01/07/2017, 2:37 PM

## 2017-01-07 NOTE — Progress Notes (Signed)
Inpatient Diabetes Program Recommendations  AACE/ADA: New Consensus Statement on Inpatient Glycemic Control (2015)  Target Ranges:  Prepandial:   less than 140 mg/dL      Peak postprandial:   less than 180 mg/dL (1-2 hours)      Critically ill patients:  140 - 180 mg/dL   Lab Results  Component Value Date   GLUCAP 232 (H) 01/07/2017   HGBA1C 7.4 (H) 12/23/2016    Review of Glycemic Control   Results for Cassandra [name], Cassandra [name] (MRN [id number]) as of 01/07/2017 07:45  Ref. Range 01/06/2017 07:49 01/06/2017 11:32 01/06/2017 16:44 01/06/2017 21:50 01/07/2017 02:25  Glucose-Capillary Latest Ref Range: 65 - 99 mg/dL 401221 (H) 027244 (H) 253198 (H) 244 (H) 232 (H)   History: DM, CKD  Home DM Meds: Lantus 4 units QHS  Current Insulin Orders: Lantus 4 units QHS                                       Novolog Sensitive Correction Scale/ SSI (0-9 units) TID AC + HS  Blood sugars remained high yesterday because Lantus insulin was held the night before- agree with current Lantus insulin order.   Please consider changing Novolog SSI to more Sensitive scale.  Patient noted to have issues with Hypoglycemia from previous admissions:  150-200 mg/dl- 1 unit [id number] mg/dl- 2 units 474-259251-300 mg/dl- 3 units 563-875301-350 mg/dl- 4 units 643-329351-400 mg/dl- 5 units  Susette RacerJulie Tadashi Burkel, RN, [place], [place]HA, CDE Diabetes Coordinator Inpatient Diabetes Program  [phone] (Team Pager) [phone] [facility] Office) 01/07/2017 7:48 AM

## 2017-01-08 ENCOUNTER — Observation Stay: Payer: Medicare Other

## 2017-01-08 LAB — GLUCOSE, CAPILLARY
Glucose-Capillary: 92 mg/dL (ref 65–99)
Glucose-Capillary: 93 mg/dL (ref 65–99)

## 2017-01-08 LAB — BASIC METABOLIC PANEL
Anion gap: 4 — ABNORMAL LOW (ref 5–15)
BUN: 20 mg/dL (ref 6–20)
CO2: 24 mmol/L (ref 22–32)
Calcium: 7.3 mg/dL — ABNORMAL LOW (ref 8.9–10.3)
Chloride: 110 mmol/L (ref 101–111)
Creatinine, Ser: 1.55 mg/dL — ABNORMAL HIGH (ref 0.44–1.00)
GFR calc Af Amer: 38 mL/min — ABNORMAL LOW (ref >60)
GFR, EST NON AFRICAN AMERICAN: 33 mL/min — AB (ref >60)
GLUCOSE: 132 mg/dL — AB (ref 65–99)
Potassium: 3.9 mmol/L (ref 3.5–5.1)
Sodium: 138 mmol/L (ref 135–145)

## 2017-01-08 LAB — METHYLMALONIC ACID, SERUM: Methylmalonic Acid, Quantitative: 654 nmol/L — ABNORMAL HIGH (ref 0–378)

## 2017-01-08 LAB — CBC
HCT: 23 % — ABNORMAL LOW (ref 35.0–47.0)
Hemoglobin: 8.1 g/dL — ABNORMAL LOW (ref 12.0–16.0)
MCH: 31.5 pg (ref 26.0–34.0)
MCHC: 35.2 g/dL (ref 32.0–36.0)
MCV: 89.4 fL (ref 80.0–100.0)
PLATELETS: 347 10*3/uL (ref 150–440)
RBC: 2.57 MIL/uL — ABNORMAL LOW (ref 3.80–5.20)
RDW: 13.9 % (ref 11.5–14.5)
WBC: 7.2 10*3/uL (ref 3.6–11.0)

## 2017-01-08 LAB — CYTOLOGY - NON PAP

## 2017-01-08 LAB — CELIAC DISEASE PANEL
ENDOMYSIAL ANTIBODY IGA: NEGATIVE
IgA: 281 mg/dL (ref 87–352)

## 2017-01-08 MED ORDER — TRAMADOL HCL 50 MG PO TABS: 50.0000 mg | ORAL_TABLET | Freq: Four times a day (QID) | ORAL | 0 refills | Status: DC | PRN

## 2017-01-08 MED ORDER — POTASSIUM CHLORIDE ER 20 MEQ PO TBCR: 20.0000 meq | EXTENDED_RELEASE_TABLET | Freq: Every day | ORAL | Status: DC

## 2017-01-08 MED ORDER — FUROSEMIDE 20 MG PO TABS: 20.0000 mg | ORAL_TABLET | Freq: Every day | ORAL | Status: DC

## 2017-01-08 MED ORDER — AMLODIPINE BESYLATE 10 MG PO TABS
10.0000 mg | ORAL_TABLET | Freq: Every day | ORAL | Status: DC
  Administered 2017-01-08: 10 mg via ORAL
  Filled 2017-01-08: qty 1

## 2017-01-08 NOTE — Clinical Social Work Note (Signed)
Patient's daughter called CSW this morning to inform that she believes patient needs to go to rehab. CSW explained that patient had give CSW permission to speak with her and tell her that she was declining rehab. Patient's daughter stated that patient may have changed her mind after the events of last evening. She explained that patient's estranged husband stated that patient was not going to stay with him (patient apparently thought she could return home and stay with her estranged husband).   CSW spoke with patient again and she did confirm that she would be willing to now go to rehab. She prefers [facility]. Bedsearch initiated. Pasrr pending.  [name] MSW,LCSW [phone]

## 2017-01-08 NOTE — Discharge Summary (Signed)
[place] [facility] [place] NAME: Cassandra [name]    MR#:  [id number]  DATE OF BIRTH:  12-May-1946  DATE OF ADMISSION:  01/04/2017 ADMITTING PHYSICIAN: Harvie Bridge, DO  DATE OF DISCHARGE: 01/08/2017  PRIMARY CARE PHYSICIAN: Cammy Copa, MD   ADMISSION DIAGNOSIS:  Colitis [K52.9] Fecal impaction ([place]) [K56.41] Generalized abdominal pain [R10.84] Constipation [K59.00]  DISCHARGE DIAGNOSIS:  Active Problems:   Fecal impaction ([place])   Constipation   Generalized abdominal pain   SECONDARY DIAGNOSIS:   Past Medical History:  Diagnosis Date  . Allergic rhinitis   . Anemia   . Anxiety   . Arthritis    "mostly feet, hands" (10/17/2016)  . Benign hypertension with CKD (chronic kidney disease) stage III   . Benign paroxysmal positional vertigo   . Bipolar disorder ([place])   . Broken finger   . Broken shoulder   . Broken toes   . Cervicalgia   . CHF (congestive heart failure) ([place]) 07/2016  . CKD (chronic kidney disease) stage 3, GFR 30-59 ml/min   . COPD (chronic obstructive pulmonary disease) ([place])   . Depression   . Elevated liver enzymes Hep B/C neg 2014  . Fall at nursing home 10/15/2016  . GERD (gastroesophageal reflux disease)   . Glaucoma   . High cholesterol   . History of alcohol use   . History of blood transfusion 09/2016   "blood got really really low"  . Hypertension   . Hypothyroidism   . Interstitial cystitis    bladder stretched every 9 months  . Migraines    "pretty much qd" (10/17/2016)  . Pneumonia 12/2016  . Psoriasis   . Stroke ([place]) 05/16/2016   Left occipital and thalamic, right hippocampal  . Thyroid disease   . Tobacco use   . 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 Vitamin B12 deficiency      ADMITTING HISTORY  Chief Complaint: Abdominal pain  HPI: Cassandra [name] is a 71 y.o. female with a known history of DM, CKD3, HTN, hypothyroid, history of C. difficile remotely   was in a usual state of health until 2 weeks ago  she was admitted to [facility] for acute encephalopathy as was secondary to age As well as hypoglycemia. She was discharged to skilled nursing facility on 5 days of Levaquin which she finished on January 12. She return to her daughter's home about 5 days ago when she started complaining of crampy abdominal pain. Patient's daughter states that she has had decreased by mouth intake, nausea, generalized weakness. Her daughter states that she believed her iron supplementation was contributing to her constipation therefore discontinued it. She also added a stool softener which seemed to help however over the last 3 days she has been having worsening abdominal pain with decreased flatus.     On questioning patient reports severe crampy abdominal pain. She also reports 1 episode of nonbloody, nonbilious vomiting. The patient's daughters are very concerned about a hernia as she has had one repaired in the past.   Otherwise there has been no change in status. Patient has been taking medication as prescribed and there has been no recent change in medication or diet.  No recent antibiotics.  There has been no recent illness, hospitalizations, travel or sick contacts.    Patient denies fevers/chills, weakness, dizziness, chest pain, shortness of breath, N/V/C/D, abdominal pain, dysuria/frequency, changes in mental status.   ED Course: in the emergency department, fecal  disimpaction was attempted 2 unsuccessfully. She also received soapsuds enema which did not help.  HOSPITAL COURSE:   # Fecal impaction: Continue stool softeners Resolved  # patient has moderate to severe ascites by CAT scan:  Seen by gastroenterology. Paracentesis showed transudate Started lasix and Kcl supplements Likely due to hypoalbuminemia and hypothyroidism. GI has ordered Abd dopplers which are normal. F/U with Dr. Jerrilyn Cairo) in 1-2 weeks  # Bilateral pleural effusions due to  hypoalbuminemia  On lasix  # chronic anemia Likely combination of B12 def and AOCD  # Diabetes Mellitus type 2;  SSI in hospital Resume home meds at discharge  # hypothyroidism TSH elevated significantly Patient compliant with meds. Increased dose to 175 mcg daily. Repeat TSH in 4 weeks  # HTN ON norvasc  # Hypokalemia: Replaced the potassium.  Stable for discharge to SNF  CONSULTS OBTAINED:  Treatment Team:  San Jetty, MD Lucilla Lame, MD  DRUG ALLERGIES:   Allergies  Allergen Reactions  . Alprazolam Other (See Comments)    Family preference, for patient to not take med  . Percocet [Oxycodone-Acetaminophen] Other (See Comments)    Family preference, for patient to not take med  . Codeine Diarrhea and Nausea And Vomiting  . Doxycycline Diarrhea and Nausea And Vomiting  . Hydrocodone Nausea And Vomiting  . Omnicef [Cefdinir] Nausea Only and Other (See Comments)    Constipation, tolerated Zosyn  . Tramadol Other (See Comments)    Pt hallucinates when taking medication. Family request not to give medication to pt   . Augmentin [Amoxicillin-Pot Clavulanate] Hives and Rash    Has patient had a PCN reaction causing immediate rash, facial/tongue/throat swelling, SOB or lightheadedness with hypotension: Yes Has patient had a PCN reaction causing severe rash involving mucus membranes or skin necrosis: Yes Did a PCN reaction that required hospitalization No Did PCN reaction occurring within the last 10 years: Yes If all of the above answers are "NO", then may proceed with Cephalosporin use.  Pt states she has taken penicillin since, and was ok with it...  . Ciprofloxacin Hives    Tolerated LVQ in 12/2016    DISCHARGE MEDICATIONS:   Current Discharge Medication List    START taking these medications   Details  cyanocobalamin (,VITAMIN B-12,) 1000 MCG/ML injection Inject 1 mL (1,000 mcg total) into the muscle once a week. Qty: 10 mL, Refills: 0    furosemide  (LASIX) 20 MG tablet Take 1 tablet (20 mg total) by mouth daily. Qty: 30 tablet    pantoprazole (PROTONIX) 40 MG tablet Take 1 tablet (40 mg total) by mouth daily. Qty: 30 tablet, Refills: 0    polyethylene glycol (MIRALAX / GLYCOLAX) packet Take 17 g by mouth daily as needed for mild constipation. Qty: 14 each, Refills: 0    potassium chloride 20 MEQ TBCR Take 20 mEq by mouth daily.    senna-docusate (SENOKOT-S) 8.6-50 MG tablet Take 2 tablets by mouth 2 (two) times daily. Qty: 60 tablet, Refills: 0      CONTINUE these medications which have CHANGED   Details  levothyroxine (SYNTHROID, LEVOTHROID) 175 MCG tablet Take 1 tablet (175 mcg total) by mouth daily before breakfast. Qty: 30 tablet, Refills: 0    traMADol (ULTRAM) 50 MG tablet Take 1 tablet (50 mg total) by mouth every 6 (six) hours as needed. Qty: 15 tablet, Refills: 0      CONTINUE these medications which have NOT CHANGED   Details  amLODipine (NORVASC) 5 MG tablet Take 1  tablet (5 mg total) by mouth daily. Qty: 30 tablet, Refills: 0    atorvastatin (LIPITOR) 10 MG tablet Take 1 tablet (10 mg total) by mouth daily at 6 PM. Qty: 30 tablet, Refills: 0    carvedilol (COREG) 3.125 MG tablet Take 1 tablet (3.125 mg total) by mouth 2 (two) times daily with a meal. Qty: 60 tablet, Refills: 0    escitalopram (LEXAPRO) 10 MG tablet Take 1.5 tablets (15 mg total) by mouth daily. Qty: 90 tablet, Refills: 0    glucose monitoring kit (FREESTYLE) monitoring kit 1 each by Does not apply route 4 (four) times daily - after meals and at bedtime. 1 month Diabetic Testing Supplies for QAC-QHS accuchecks. Qty: 1 each, Refills: 1    Insulin Pen Needle 32G X 8 MM MISC Use as directed Qty: 100 each, Refills: 0    acetaminophen (TYLENOL) 325 MG tablet Take 650 mg by mouth every 6 (six) hours as needed for mild pain or fever.     Amino Acids-Protein Hydrolys (FEEDING SUPPLEMENT, PRO-STAT SUGAR FREE 64,) LIQD Take 30 mLs by mouth 2 (two)  times daily. Qty: 60 Bottle, Refills: 0    Insulin Glargine (LANTUS) 100 UNIT/ML Solostar Pen Inject 4 Units into the skin daily at 10 pm. Qty: 15 mL, Refills: 0    insulin lispro (HUMALOG JUNIOR KWIKPEN) 100 UNIT/ML KiwkPen Inject 0.01-0.05 mLs (1-5 Units total) into the skin 3 (three) times daily before meals. For CBGs < 150 = 0 units; 151 - 200 = 1 units, 201 - 250 = 2 units, 251 - 300 = 3 units; 301-350 = 4 units; 351-400 = 5 units; > 400 notify the provider.  DO NOT GIVE IF PATIENT NOT EATING MEA Qty: 15 mL, Refills: 0    iron polysaccharides (NIFEREX) 150 MG capsule Take 1 capsule (150 mg total) by mouth 2 (two) times daily. Qty: 60 capsule, Refills: 0    ondansetron (ZOFRAN) 4 MG tablet Take 1 tablet (4 mg total) by mouth every 8 (eight) hours as needed for nausea or vomiting. Qty: 20 tablet, Refills: 0      STOP taking these medications     clopidogrel (PLAVIX) 75 MG tablet      aspirin 81 MG chewable tablet      famotidine (PEPCID) 20 MG tablet      guaiFENesin (MUCINEX) 600 MG 12 hr tablet      meclizine (ANTIVERT) 25 MG tablet      nystatin-triamcinolone ointment (MYCOLOG)         Today   VITAL SIGNS:  Blood pressure (!) 172/79, pulse 71, temperature 98 F (36.7 C), temperature source Oral, resp. rate 12, height '5\' 2"'$  (1.575 m), weight 55.3 kg (121 lb 14.4 oz), SpO2 100 %.  I/O:   Intake/Output Summary (Last 24 hours) at 01/08/17 1458 Last data filed at 01/08/17 0500  Gross per 24 hour  Intake              280 ml  Output                0 ml  Net              280 ml    PHYSICAL EXAMINATION:  Physical Exam  GENERAL:  71 y.o.-year-old patient lying in the bed with no acute distress.  LUNGS: Normal breath sounds bilaterally, no wheezing, rales,rhonchi or crepitation. No use of accessory muscles of respiration.  CARDIOVASCULAR: S1, S2 normal. No murmurs, rubs, or gallops.  ABDOMEN:  Soft, non-tender, non-distended. Bowel sounds present. No organomegaly or  mass.  NEUROLOGIC: Moves all 4 extremities. PSYCHIATRIC: The patient is alert and oriented x 3.  SKIN: No obvious rash, lesion, or ulcer.   DATA REVIEW:   CBC  Recent Labs Lab 01/08/17 0437  WBC 7.2  HGB 8.1*  HCT 23.0*  PLT 347    Chemistries   Recent Labs Lab 01/04/17 1348  01/06/17 1538 01/07/17 0507 01/08/17 0437  NA 138  < >  --  139 138  K 3.3*  < >  --  4.5 3.9  CL 106  < >  --  109 110  CO2 19*  < >  --  22 24  GLUCOSE 267*  < >  --  248* 132*  BUN 17  < >  --  21* 20  CREATININE 1.27*  < >  --  1.73* 1.55*  CALCIUM 8.6*  < >  --  7.1* 7.3*  MG  --   < >  --  1.9  --   AST 45*  --   --   --   --   ALT 24  --   --   --   --   ALKPHOS 131*  --   --   --   --   BILITOT 0.7  --  0.2*  --   --   < > = values in this interval not displayed.  Cardiac Enzymes No results for input(s): TROPONINI in the last 168 hours.  Microbiology Results  Results for orders placed or performed during the hospital encounter of 01/04/17  Body fluid culture     Status: None (Preliminary result)   Collection Time: 01/06/17  2:35 PM  Result Value Ref Range Status   Specimen Description PERITONEAL  Final   Special Requests NONE  Final   Gram Stain   Final    MODERATE WBC PRESENT, PREDOMINANTLY MONONUCLEAR NO ORGANISMS SEEN    Culture   Final    NO GROWTH 2 DAYS Performed at [facility], [address], [place], [place] [address]    Report Status PENDING  Incomplete    RADIOLOGY:  Dg Chest Bilateral Decubitus  Result Date: 01/07/2017 CLINICAL DATA:  Pleural effusion. EXAM: CHEST - BILATERAL DECUBITUS VIEW COMPARISON:  01/04/2017. FINDINGS: New bilateral layering small moderate pleural effusions are noted. Heart size normal. Prior cervical spine fusion . IMPRESSION: Bilateral layering small to moderate pleural effusions. Electronically Signed   By: Marcello Moores  Register   On: 01/07/2017 15:04   [place] Art/ven Flow Abd Pelv Doppler  Result Date: 01/08/2017 CLINICAL DATA:   Ascites. EXAM: DUPLEX ULTRASOUND OF LIVER TECHNIQUE: Color and duplex Doppler ultrasound was performed to evaluate the hepatic in-flow and out-flow vessels. COMPARISON:  CT 01/04/2017 and previous FINDINGS: Portal Vein 1.1 cm diameter. No evidence of occlusion or thrombus. Velocities (all hepatopetal): Main:  31-54 cm/sec Right:  23 cm/sec Left:  20 cm/sec Intrahepatic IVC patent, velocity 41 cm/second. Hepatic Vein Velocities (all hepatofugal): Right:  18 cm/sec Middle:  21 cm/sec Left:  39 cm/sec Hepatic Artery Velocity:  154 cm/sec Spleen 5.6 x 7.2 x 3.1 cm (volume = 65 cm^3). Splenic Vein: No evidence of occlusion or thrombus. Velocity: 15 cm/sec Varices: None seen Ascites: Present IMPRESSION: 1. Unremarkable hepatic vascular Doppler assessment. 2. Abdominal ascites. Electronically Signed   By: Lucrezia Europe M.D.   On: 01/08/2017 10:41   Dg Abd Portable 2v  Result Date: 01/07/2017 CLINICAL DATA:  71 year old female with nausea vomiting and abdominal distention. EXAM: PORTABLE ABDOMEN - 2 VIEW COMPARISON:  CT of the abdomen pelvis dated 01/04/2017 abdominal ultrasound dated 01/06/2017 FINDINGS: Moderate amount of stool noted throughout the colon. No bowel dilatation or evidence of obstruction. There is silhouetting of the abdominal organs on likely related to ascites. No free air identified. Right upper quadrant cholecystectomy clips noted. There is degenerative changes of the osseous structures. Small left pleural effusion and left lung base atelectasis versus infiltrate. IMPRESSION: Moderate stool within the colon.  No bowel obstruction. Probable small ascites. Small left pleural effusion and left lung base atelectasis versus infiltrate. Electronically Signed   By: Anner Crete M.D.   On: 01/07/2017 05:06    Follow up with PCP in 1 week.  Management plans discussed with the patient, family and they are in agreement.  CODE STATUS:     Code Status Orders        Start     Ordered   01/04/17  2254  Full code  Continuous     01/04/17 2253    Code Status History    Date Active Date Inactive Code Status Order ID Comments User Context   12/23/2016 11:35 AM 12/29/2016  6:13 PM Full Code [id number]  Rondel Jumbo, PA-C ED   12/03/2016  2:07 AM 12/04/2016  8:30 PM Full Code [id number]  Lance Coon, MD Inpatient   10/21/2016  2:27 PM 10/23/2016  6:25 PM DNR [id number]  Jannette Fogo, NP Inpatient   10/17/2016  3:33 PM 10/21/2016  2:27 PM Full Code [id number]  Samella Parr, NP Inpatient   10/02/2016  5:25 PM 10/09/2016  6:34 PM Full Code [id number]  Debbe Odea, MD ED   09/20/2016  6:24 PM 09/23/2016  8:02 PM Full Code [id number]  Barton Dubois, MD Inpatient   09/06/2016  5:09 PM 09/09/2016  2:36 PM Full Code [id number]  Theodis Blaze, MD Inpatient   08/26/2016  7:26 PM 08/30/2016  7:59 PM Full Code [id number]  Dionne Milo, NP Inpatient   08/26/2016  5:12 PM 08/26/2016  7:26 PM Full Code [id number]  Debbe Odea, MD ED   07/19/2016 10:19 PM 07/21/2016  7:49 PM Full Code [id number]  Lily Kocher, MD Inpatient   07/11/2016 11:42 PM 07/18/2016  5:48 PM Full Code [id number]  Reubin Milan, MD ED   06/24/2016  5:17 AM 06/25/2016  9:55 AM Full Code [id number]  Anders Simmonds, MD Inpatient   06/22/2016  6:48 PM 06/24/2016  5:17 AM Full Code [id number]  Javier Glazier, MD ED   05/06/2016  4:08 PM 05/19/2016  6:12 PM Full Code [id number]  Rush Farmer, MD Inpatient   05/06/2016  2:59 PM 05/06/2016  4:08 PM Full Code [id number]  Awilda Bill, NP ED   05/06/2016 12:54 PM 05/06/2016  2:59 PM Full Code [id number]  Awilda Bill, NP ED    Advance Directive Documentation   Flowsheet Row Most Recent Value  Type of Advance Directive  Healthcare Power of Attorney, Living will  Pre-existing out of facility DNR order (yellow form or pink MOST form)  No data  "MOST" Form in Place?  No data      TOTAL TIME TAKING CARE OF THIS PATIENT ON DAY OF DISCHARGE: more than 30 minutes.   Hillary Bow R M.D on 01/08/2017 at  2:58 PM  Between 7am to 6pm - Pager - [phone]  After 6pm go to [email or web address] - password [email or web address]  [facility]  Office  [phone]  CC: Primary care physician; Cammy Copa, MD  Note: This dictation was prepared with Dragon dictation along with smaller phrase technology. Any transcriptional errors that result from this process are unintentional.

## 2017-01-08 NOTE — Clinical Social Work Note (Signed)
CSW spoke with patient and due to her home situation is now willing to go to rehab. Patient has a previous level 2pasrr and currently her pasrr is pending. Patient has accepted bed offer from [facility] and can go once pasrr obtained. York SpanielMonica Kynnedi [name] MSW,LCSW [phone]

## 2017-01-08 NOTE — Progress Notes (Signed)
PT Cancellation Note  Patient Details Name: Merril AbbeBillie A Efferson MRN: [id number] DOB: 08-25-1946   Cancelled Treatment:    Reason Eval/Treat Not Completed: Patient declined, no reason specified. Treatment attempted; pt refuses noting that she is "too sick"; nauseated and episodes of diarrhea. Re attempt at a later date as the schedule allows.    Scot DockHeidi E Barnes, PTA 01/08/2017, 11:30 AM

## 2017-01-08 NOTE — Progress Notes (Signed)
Pt to be discharged per MD order. IV removed. Instructions enclosed in transport packet which was made by CSW including discharge scripts. PT [facility] leave via EMS, [facility]

## 2017-01-08 NOTE — Progress Notes (Signed)
SNF and Non-Emergent EMS Transport Benefits:  Number called: [phone] Rep: Apple Reference Number: [id number]  AARP Medicare Complete HMO Plan One active as of 12/16/16 with no deductible.  Out of pocket max is $4400, of which $33.08 met so far.  In-network SNF: $0 copay for days 1-20, a $160 daily copay for days 21-48, and a $0 copay for days 49-100.  Once out of pocket is reached, patient covered at 100% for remainder of 100 day benefit period. Per rep, 25 SNF days used during December 2017.  $0 copay for professional fees and 3 day hospital stay is not required.  Josem Kaufmann is required: [phone].    Non-emergent EMS transport: $250 copay for each one way medically necessary, Medicare covered trip.  Josem Kaufmann is not required.

## 2017-01-08 NOTE — Progress Notes (Signed)
Pleural effusions seen on CXR- would not be safe for anesthesia and endoscopy at this time  CBC Latest Ref Rng & Units 01/08/2017 01/07/2017 01/06/2017  WBC 3.6 - 11.0 K/uL 7.2 6.3 -  Hemoglobin 12.0 - 16.0 g/dL 8.1(L) 7.6(L) 7.9(L)  Hematocrit 35.0 - 47.0 % 23.0(L) 21.9(L) -  Platelets 150 - 440 K/uL 347 329 -    Hemoglobin is stable.   Dr Wyline MoodKiran Jonathon Tan  Gastroenterology/Hepatology Pager: [phone]

## 2017-01-08 NOTE — [place] FL2 (Signed)
[place] MEDICAID FL2 LEVEL OF CARE SCREENING TOOL     IDENTIFICATION  Patient Name: Cassandra [name] Birthdate: August 30, 1946 Sex: female Admission Date (Current Location): 01/04/2017  [facility] [place]   Facility and Address:  [facility], [address], [place], [place] [address]      Provider Number: [id number]  Attending Physician Name and Address:  Milagros LollSrikar Sudini, MD  Relative Name and Phone Number:       Current Level of Care: Hospital Recommended Level of Care: Skilled Nursing Facility Prior Approval Number:    Date Approved/Denied:   PASRR Number:    Discharge Plan: SNF    Current Diagnoses: Patient Active Problem List   Diagnosis Date Noted  . Constipation   . Generalized abdominal pain   . Fecal impaction (HCC) 01/04/2017  . HAP (hospital-acquired pneumonia) 12/23/2016  . HCAP (healthcare-associated pneumonia)   . Diabetes mellitus with complication (HCC)   . Hypoglycemia 12/03/2016  . Palliative care encounter   . Goals of care, counseling/discussion   . Cervical vertebral fracture (HCC) 10/17/2016  . Thoracic vertebral fracture (HCC) 10/17/2016  . Fall at nursing home 10/17/2016  . Common bile duct dilatation 10/17/2016  . Closed fracture of cervical vertebra (HCC)   . Recent Gram-negative bacteremia 10/07/2016  . Gastroesophageal reflux disease   . DKA, type 1 (HCC) 09/06/2016  . Protein-calorie malnutrition, severe 08/28/2016  . Chronic diastolic heart failure (HCC) 07/19/2016  . Dehydration   . Cerebral embolism with cerebral infarction 07/13/2016  . Left ventricular diastolic dysfunction, NYHA class 1 07/11/2016  . History of CVA (cerebrovascular accident)   . HLD (hyperlipidemia)   . Anemia of chronic disease   . Physical deconditioning   . Acute on chronic kidney failure (HCC)   . Diabetic ketoacidosis with coma associated with type 1 diabetes mellitus (HCC)   . Cerebral thrombosis with  cerebral infarction 05/15/2016  . Generalized anxiety disorder 05/10/2016  . Diabetic ketoacidosis without coma associated with type 1 diabetes mellitus (HCC) 05/06/2016  . Insulin dependent type 2 diabetes mellitus, uncontrolled (HCC)   . Depression   . CKD (chronic kidney disease) stage 3, GFR 30-59 ml/min   . Uncontrolled Hypothyroidism   . Vitamin B12 deficiency   . Benign paroxysmal positional vertigo   . Anxiety   . Allergic rhinitis   . Glaucoma   . Benign hypertension with CKD (chronic kidney disease) stage III   . Tobacco abuse   . Cervicalgia   . Elevated liver enzymes   . History of alcohol use     Orientation RESPIRATION BLADDER Height & Weight     Self, Time, Situation, Place  Normal Incontinent Weight: 121 lb 14.4 oz (55.3 kg) Height:  5\' 2"  (157.5 cm)  BEHAVIORAL SYMPTOMS/MOOD NEUROLOGICAL BOWEL NUTRITION STATUS   (none) Convulsions/Seizures (none) Incontinent Diet (currently npo but to be advanced)  AMBULATORY STATUS COMMUNICATION OF NEEDS Skin   Extensive Assist Verbally Normal                       Personal Care Assistance Level of Assistance  Dressing, Bathing Bathing Assistance: Limited assistance Feeding assistance: Independent Dressing Assistance: Limited assistance     Functional Limitations Info   (no issues)          SPECIAL CARE FACTORS FREQUENCY  PT (By licensed PT)                    Contractures Contractures Info:  Not present    Additional Factors Info  Code Status Code Status Info: full;  Allergies Info: alprazolam, percocet, codeine, doxycycline, hydrocodone, omnicef, augmentin, ciprofloxacin           Current Medications (01/08/2017):  This is the current hospital active medication list Current Facility-Administered Medications  Medication Dose Route Frequency Provider Last Rate Last Dose  . albumin human 5 % solution 25 g  25 g Intravenous Once Srikar Sudini, MD      . albuterol (PROVENTIL) (2.5 MG/3ML) 0.083%  nebulizer solution 2.5 mg  2.5 mg Nebulization Q6H PRN Alexis Hugelmeyer, DO      . amLODipine (NORVASC) tablet 10 mg  10 mg Oral Daily Milagros Loll, MD   10 mg at 01/08/17 1041  . atorvastatin (LIPITOR) tablet 10 mg  10 mg Oral q1800 Alexis Hugelmeyer, DO   10 mg at 01/07/17 1749  . bisacodyl (DULCOLAX) suppository 10 mg  10 mg Rectal Once Milagros Loll, MD   Stopped at 01/06/17 1745  . carvedilol (COREG) tablet 3.125 mg  3.125 mg Oral BID WC Alexis Hugelmeyer, DO   6.25 mg at 01/08/17 0900  . escitalopram (LEXAPRO) tablet 15 mg  15 mg Oral Daily Alexis Hugelmeyer, DO   15 mg at 01/08/17 1055  . furosemide (LASIX) tablet 40 mg  40 mg Oral Once [employer], MD      . insulin aspart (novoLOG) injection 0-5 Units  0-5 Units Subcutaneous QHS Alexis Hugelmeyer, DO   2 Units at 01/07/17 2121  . insulin aspart (novoLOG) injection 0-9 Units  0-9 Units Subcutaneous TID WC Alexis Hugelmeyer, DO   2 Units at 01/07/17 1753  . insulin glargine (LANTUS) injection 4 Units  4 Units Subcutaneous Q2200 Alexis Hugelmeyer, DO   4 Units at 01/07/17 2122  . levothyroxine (SYNTHROID, LEVOTHROID) tablet 200 mcg  200 mcg Oral QAC breakfast Milagros Loll, MD   200 mcg at 01/08/17 0900  . metoCLOPramide (REGLAN) injection 5 mg  5 mg Intravenous Q12H Lacey Jensen, MD   5 mg at 01/08/17 1046  . ondansetron (ZOFRAN) tablet 4 mg  4 mg Oral Q6H PRN Alexis Hugelmeyer, DO       Or  . ondansetron (ZOFRAN) injection 4 mg  4 mg Intravenous Q6H PRN Alexis Hugelmeyer, DO   4 mg at 01/08/17 0654  . pantoprazole (PROTONIX) EC tablet 40 mg  40 mg Oral Daily Lacey Jensen, MD   40 mg at 01/08/17 1041  . polyethylene glycol (MIRALAX / GLYCOLAX) packet 17 g  17 g Oral Daily Lacey Jensen, MD   17 g at 01/06/17 0853  . senna-docusate (Senokot-S) tablet 2 tablet  2 tablet Oral BID Milagros Loll, MD   2 tablet at 01/08/17 1041     Discharge Medications: Please see discharge summary for a list of discharge medications.  Relevant Imaging  Results:  Relevant Lab Results:   Additional Information ss: [id number]  York Spaniel, LCSW

## 2017-01-09 LAB — BODY FLUID CULTURE: Culture: NO GROWTH

## 2017-01-14 ENCOUNTER — Ambulatory Visit: Payer: Medicare Other | Admitting: Gastroenterology

## 2017-02-18 ENCOUNTER — Ambulatory Visit: Payer: Self-pay | Admitting: Neurology

## 2017-03-04 ENCOUNTER — Emergency Department (HOSPITAL_COMMUNITY)
Admission: EM | Admit: 2017-03-04 | Discharge: 2017-03-04 | Disposition: A | Discharge status: Home / Self Care | Payer: Medicare Other | Attending: Dermatology | Admitting: Dermatology

## 2017-03-04 DIAGNOSIS — Z5321 Procedure and treatment not carried out due to patient leaving prior to being seen by health care provider: Secondary | ICD-10-CM | POA: Insufficient documentation

## 2017-03-04 DIAGNOSIS — R197 Diarrhea, unspecified: Secondary | ICD-10-CM | POA: Diagnosis present

## 2017-03-04 NOTE — ED Notes (Signed)
Pt reported to registration that they were leaving without being seen

## 2017-03-04 NOTE — ED Triage Notes (Addendum)
Per report: Pt c/o diarrhea x last 2 days w/ nausea.  Was d/c from a rehab facility 4 days ago and states she had weakness to legs after she was d/c'd but she was able to transfer to w/c w/minimal assist.  Pt lives at home w/family member and has been using a walker.  Was treated for uti prior to her d/c.  EMS VS:  CBG 300, 160/90, 70, 16, 100%RA.

## 2017-03-07 ENCOUNTER — Inpatient Hospital Stay (HOSPITAL_COMMUNITY)
Admission: EM | Admit: 2017-03-07 | Discharge: 2017-03-12 | DRG: 637 | Disposition: A | Discharge status: Home Health | Payer: Medicare Other | Attending: Family Medicine | Admitting: Family Medicine

## 2017-03-07 ENCOUNTER — Encounter (HOSPITAL_COMMUNITY): Payer: Self-pay | Admitting: Emergency Medicine

## 2017-03-07 DIAGNOSIS — E10649 Type 1 diabetes mellitus with hypoglycemia without coma: Secondary | ICD-10-CM | POA: Diagnosis not present

## 2017-03-07 DIAGNOSIS — E875 Hyperkalemia: Secondary | ICD-10-CM | POA: Diagnosis present

## 2017-03-07 DIAGNOSIS — E1010 Type 1 diabetes mellitus with ketoacidosis without coma: Secondary | ICD-10-CM | POA: Diagnosis not present

## 2017-03-07 DIAGNOSIS — E039 Hypothyroidism, unspecified: Secondary | ICD-10-CM | POA: Diagnosis present

## 2017-03-07 DIAGNOSIS — N179 Acute kidney failure, unspecified: Secondary | ICD-10-CM | POA: Diagnosis present

## 2017-03-07 DIAGNOSIS — Z8673 Personal history of transient ischemic attack (TIA), and cerebral infarction without residual deficits: Secondary | ICD-10-CM

## 2017-03-07 DIAGNOSIS — I5033 Acute on chronic diastolic (congestive) heart failure: Secondary | ICD-10-CM | POA: Diagnosis not present

## 2017-03-07 DIAGNOSIS — K5900 Constipation, unspecified: Secondary | ICD-10-CM | POA: Diagnosis present

## 2017-03-07 DIAGNOSIS — I130 Hypertensive heart and chronic kidney disease with heart failure and stage 1 through stage 4 chronic kidney disease, or unspecified chronic kidney disease: Secondary | ICD-10-CM | POA: Diagnosis present

## 2017-03-07 DIAGNOSIS — Z87891 Personal history of nicotine dependence: Secondary | ICD-10-CM

## 2017-03-07 DIAGNOSIS — Z794 Long term (current) use of insulin: Secondary | ICD-10-CM

## 2017-03-07 DIAGNOSIS — E538 Deficiency of other specified B group vitamins: Secondary | ICD-10-CM | POA: Diagnosis present

## 2017-03-07 DIAGNOSIS — Z981 Arthrodesis status: Secondary | ICD-10-CM

## 2017-03-07 DIAGNOSIS — F319 Bipolar disorder, unspecified: Secondary | ICD-10-CM | POA: Diagnosis present

## 2017-03-07 DIAGNOSIS — E785 Hyperlipidemia, unspecified: Secondary | ICD-10-CM | POA: Diagnosis present

## 2017-03-07 DIAGNOSIS — I959 Hypotension, unspecified: Secondary | ICD-10-CM | POA: Diagnosis present

## 2017-03-07 DIAGNOSIS — D631 Anemia in chronic kidney disease: Secondary | ICD-10-CM | POA: Diagnosis present

## 2017-03-07 DIAGNOSIS — J449 Chronic obstructive pulmonary disease, unspecified: Secondary | ICD-10-CM | POA: Diagnosis present

## 2017-03-07 DIAGNOSIS — E1011 Type 1 diabetes mellitus with ketoacidosis with coma: Secondary | ICD-10-CM | POA: Diagnosis present

## 2017-03-07 DIAGNOSIS — Z9119 Patient's noncompliance with other medical treatment and regimen: Secondary | ICD-10-CM

## 2017-03-07 DIAGNOSIS — N183 Chronic kidney disease, stage 3 (moderate): Secondary | ICD-10-CM | POA: Diagnosis present

## 2017-03-07 DIAGNOSIS — R112 Nausea with vomiting, unspecified: Secondary | ICD-10-CM | POA: Diagnosis present

## 2017-03-07 DIAGNOSIS — R1110 Vomiting, unspecified: Secondary | ICD-10-CM | POA: Diagnosis not present

## 2017-03-07 DIAGNOSIS — Z79899 Other long term (current) drug therapy: Secondary | ICD-10-CM

## 2017-03-07 DIAGNOSIS — I129 Hypertensive chronic kidney disease with stage 1 through stage 4 chronic kidney disease, or unspecified chronic kidney disease: Secondary | ICD-10-CM | POA: Diagnosis present

## 2017-03-07 DIAGNOSIS — K219 Gastro-esophageal reflux disease without esophagitis: Secondary | ICD-10-CM | POA: Diagnosis present

## 2017-03-07 DIAGNOSIS — E860 Dehydration: Secondary | ICD-10-CM | POA: Diagnosis present

## 2017-03-07 DIAGNOSIS — K529 Noninfective gastroenteritis and colitis, unspecified: Secondary | ICD-10-CM | POA: Diagnosis present

## 2017-03-07 DIAGNOSIS — E1022 Type 1 diabetes mellitus with diabetic chronic kidney disease: Secondary | ICD-10-CM | POA: Diagnosis present

## 2017-03-07 DIAGNOSIS — E7800 Pure hypercholesterolemia, unspecified: Secondary | ICD-10-CM | POA: Diagnosis present

## 2017-03-07 DIAGNOSIS — Z23 Encounter for immunization: Secondary | ICD-10-CM

## 2017-03-07 LAB — CBG MONITORING, ED

## 2017-03-07 MED ORDER — DICYCLOMINE HCL 10 MG/ML IM SOLN
20.0000 mg | Freq: Once | INTRAMUSCULAR | Status: AC
  Administered 2017-03-08: 20 mg via INTRAMUSCULAR
  Filled 2017-03-07: qty 2

## 2017-03-07 MED ORDER — ONDANSETRON HCL 4 MG/2ML IJ SOLN
4.0000 mg | Freq: Once | INTRAMUSCULAR | Status: AC
  Administered 2017-03-08: 4 mg via INTRAVENOUS
  Filled 2017-03-07: qty 2

## 2017-03-07 MED ORDER — SODIUM CHLORIDE 0.9 % IV BOLUS (SEPSIS)
1000.0000 mL | Freq: Once | INTRAVENOUS | Status: AC
  Administered 2017-03-08: 1000 mL via INTRAVENOUS

## 2017-03-07 NOTE — ED Triage Notes (Signed)
Pt brought in by EMS for c/o vomiting and constipation  EMS states upon arrival pt asked her husband to tell them why he called but husband did not know because she did not tell them  Pt said she had been vomiting today which is normal for her as she has pancreatitis  Pt then states she was constipated but her husband states she had a BM earlier in the day  Pt got up and went to the bathroom while EMS was there and had a BM in her diaper while sitting on the toilet then proceeded to walk back to bed and insisted that EMS and her husband clean her up   While they were cleaning her up she had another BM in the bed because she states she could not get up and walk  Pt also insisted they rub an unknown cream on her rectal area because it was burning  EMS states VS were 150/90, P-84, and CBG of 579

## 2017-03-07 NOTE — ED Provider Notes (Signed)
[facility] DEPT Provider Note   CSN: [id number] Arrival date & time: 03/07/17  2233  By signing my name below, I, Oleh Genin, attest that this documentation has been prepared under the direction and in the presence of Nuriyah Hanline, MD. Electronically Signed: Oleh Genin, Scribe. 03/07/17. 11:33 PM.   History   Chief Complaint Chief Complaint  Patient presents with  . Hyperglycemia    HPI Cassandra [name] is a 71 y.o. female with history of COPD, CHF, CKD III, HTN, and HLD who presents to the ED for evaluation of global weakness. This patient states that this morning she woke with nausea, frequent vomiting, diarrhea, and generalized weakness. She took her blood sugar and measured herself at approximately 500 mg/dL at home which is atypical for her. She is on sliding scale Humalog at home; she denies any recent changes in this medication. Although when asked, she does admit to not taking her insulin as frequent as prescribed today due to her symptoms. She denies any changes in her diet. She denies any chest or abdominal pain.   The history is provided by the patient. No language interpreter was used.  Hyperglycemia  Severity:  Severe Onset quality:  Gradual Timing:  Constant Progression:  Worsening Chronicity:  Chronic Diabetes status:  Controlled with insulin Context: not change in medication   Relieved by:  Nothing Ineffective treatments:  None tried Associated symptoms: nausea and vomiting   Associated symptoms: no abdominal pain, no chest pain, no dysuria, no fever and no shortness of breath   Risk factors: hx of DKA     Past Medical History:  Diagnosis Date  . Allergic rhinitis   . Anemia   . Anxiety   . Arthritis    "mostly feet, hands" (10/17/2016)  . Benign hypertension with CKD (chronic kidney disease) stage III   . Benign paroxysmal positional vertigo   . Bipolar disorder ([place])   . Broken finger   . Broken shoulder   . Broken toes   .  Cervicalgia   . CHF (congestive heart failure) ([place]) 07/2016  . CKD (chronic kidney disease) stage 3, GFR 30-59 ml/min   . COPD (chronic obstructive pulmonary disease) ([place])   . Depression   . Elevated liver enzymes Hep B/C neg 2014  . Fall at nursing home 10/15/2016  . GERD (gastroesophageal reflux disease)   . Glaucoma   . High cholesterol   . History of alcohol use   . History of blood transfusion 09/2016   "blood got really really low"  . Hypertension   . Hypothyroidism   . Interstitial cystitis    bladder stretched every 9 months  . Migraines    "pretty much qd" (10/17/2016)  . Pneumonia 12/2016  . Psoriasis   . Stroke ([place]) 05/16/2016   Left occipital and thalamic, right hippocampal  . Thyroid disease   . Tobacco use   . 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 Vitamin B12 deficiency     Patient Active Problem List   Diagnosis Date Noted  . Constipation   . Generalized abdominal pain   . Fecal impaction ([place]) 01/04/2017  . HAP (hospital-acquired pneumonia) 12/23/2016  . HCAP (healthcare-associated pneumonia)   . Diabetes mellitus with complication ([place])   . Hypoglycemia 12/03/2016  . Palliative care encounter   . Goals of care, counseling/discussion   . Cervical vertebral fracture ([place]) 10/17/2016  . Thoracic vertebral fracture ([place]) 10/17/2016  . Fall at nursing home 10/17/2016  . Common  bile duct dilatation 10/17/2016  . Closed fracture of cervical vertebra ([place])   . Recent Gram-negative bacteremia 10/07/2016  . Gastroesophageal reflux disease   . DKA, type 1 ([place]) 09/06/2016  . Protein-calorie malnutrition, severe 08/28/2016  . Chronic diastolic heart failure ([place]) 07/19/2016  . Dehydration   . Cerebral embolism with cerebral infarction 07/13/2016  . Left ventricular diastolic dysfunction, NYHA class 1 07/11/2016  . History of CVA (cerebrovascular accident)   . HLD (hyperlipidemia)   . Anemia of chronic disease   . Physical  deconditioning   . Acute on chronic kidney failure ([place])   . Diabetic ketoacidosis with coma associated with type 1 diabetes mellitus ([place])   . Cerebral thrombosis with cerebral infarction 05/15/2016  . Generalized anxiety disorder 05/10/2016  . Diabetic ketoacidosis without coma associated with type 1 diabetes mellitus ([place]) 05/06/2016  . Insulin dependent type 2 diabetes mellitus, uncontrolled ([place])   . Depression   . CKD (chronic kidney disease) stage 3, GFR 30-59 ml/min   . Uncontrolled Hypothyroidism   . Vitamin B12 deficiency   . Benign paroxysmal positional vertigo   . Anxiety   . Allergic rhinitis   . Glaucoma   . Benign hypertension with CKD (chronic kidney disease) stage III   . Tobacco abuse   . Cervicalgia   . Elevated liver enzymes   . History of alcohol use     Past Surgical History:  Procedure Laterality Date  . ANTERIOR CERVICAL DECOMP/DISCECTOMY FUSION  ~ 2009  . APPENDECTOMY    . BACK SURGERY    . BLADDER SUSPENSION    . CATARACT EXTRACTION W/ INTRAOCULAR LENS  IMPLANT, BILATERAL Bilateral   . DILATION AND CURETTAGE OF UTERUS    . HERNIA REPAIR    . LAPAROSCOPIC CHOLECYSTECTOMY    . TONSILLECTOMY    . TUBAL LIGATION    . VAGINAL HYSTERECTOMY     with oophorectomy    OB History    No data available       Home Medications    Prior to Admission medications   Medication Sig Start Date End Date Taking? Authorizing Provider  acetaminophen (TYLENOL) 325 MG tablet Take 650 mg by mouth every 6 (six) hours as needed for mild pain or fever.     Historical Provider, MD  Amino Acids-Protein Hydrolys (FEEDING SUPPLEMENT, PRO-STAT SUGAR FREE 64,) LIQD Take 30 mLs by mouth 2 (two) times daily. Patient not taking: Reported on 01/04/2017 12/27/16   Jonetta Osgood, MD  amLODipine (NORVASC) 5 MG tablet Take 1 tablet (5 mg total) by mouth daily. 12/27/16   Shanker Kristeen Mans, MD  atorvastatin (LIPITOR) 10 MG tablet Take 1 tablet (10 mg total) by mouth daily at 6 PM.  12/27/16   Shanker Kristeen Mans, MD  carvedilol (COREG) 3.125 MG tablet Take 1 tablet (3.125 mg total) by mouth 2 (two) times daily with a meal. 12/27/16   Shanker Kristeen Mans, MD  cyanocobalamin (,VITAMIN B-12,) 1000 MCG/ML injection Inject 1 mL (1,000 mcg total) into the muscle once a week. 01/07/17 03/12/17  Srikar Sudini, MD  escitalopram (LEXAPRO) 10 MG tablet Take 1.5 tablets (15 mg total) by mouth daily. 12/27/16   Shanker Kristeen Mans, MD  furosemide (LASIX) 20 MG tablet Take 1 tablet (20 mg total) by mouth daily. 01/08/17   Srikar Sudini, MD  glucose monitoring kit (FREESTYLE) monitoring kit 1 each by Does not apply route 4 (four) times daily - after meals and at bedtime. 1 month Diabetic Testing Supplies for  QAC-QHS accuchecks. 12/27/16   Shanker Kristeen Mans, MD  Insulin Glargine (LANTUS) 100 UNIT/ML Solostar Pen Inject 4 Units into the skin daily at 10 pm. Patient taking differently: Inject 4 Units into the skin daily at 10 pm. Per sliding scale 12/27/16   Jonetta Osgood, MD  insulin lispro (HUMALOG JUNIOR KWIKPEN) 100 UNIT/ML KiwkPen Inject 0.01-0.05 mLs (1-5 Units total) into the skin 3 (three) times daily before meals. For CBGs < 150 = 0 units; 151 - 200 = 1 units, 201 - 250 = 2 units, 251 - 300 = 3 units; 301-350 = 4 units; 351-400 = 5 units; > 400 notify the provider.  DO NOT GIVE IF PATIENT NOT EATING MEA Patient not taking: Reported on 01/04/2017 12/27/16   Jonetta Osgood, MD  Insulin Pen Needle 32G X 8 MM MISC Use as directed 12/27/16   Jonetta Osgood, MD  iron polysaccharides (NIFEREX) 150 MG capsule Take 1 capsule (150 mg total) by mouth 2 (two) times daily. Patient not taking: Reported on 01/04/2017 12/27/16   Jonetta Osgood, MD  levothyroxine (SYNTHROID, LEVOTHROID) 175 MCG tablet Take 1 tablet (175 mcg total) by mouth daily before breakfast. 01/07/17   Hillary Bow, MD  ondansetron (ZOFRAN) 4 MG tablet Take 1 tablet (4 mg total) by mouth every 8 (eight) hours as needed for nausea or  vomiting. Patient not taking: Reported on 01/04/2017 12/29/16   Jonetta Osgood, MD  pantoprazole (PROTONIX) 40 MG tablet Take 1 tablet (40 mg total) by mouth daily. 01/08/17   Srikar Sudini, MD  polyethylene glycol (MIRALAX / GLYCOLAX) packet Take 17 g by mouth daily as needed for mild constipation. 01/07/17   Srikar Sudini, MD  potassium chloride 20 MEQ TBCR Take 20 mEq by mouth daily. 01/08/17   Srikar Sudini, MD  senna-docusate (SENOKOT-S) 8.6-50 MG tablet Take 2 tablets by mouth 2 (two) times daily. 01/07/17   Hillary Bow, MD  traMADol (ULTRAM) 50 MG tablet Take 1 tablet (50 mg total) by mouth every 6 (six) hours as needed. 01/08/17   Hillary Bow, MD    Family History Family History  Problem Relation Age of Onset  . Alcohol abuse Mother   . Arthritis Mother   . Asthma Mother   . Cancer Mother     colon cancer  . Hypertension Mother   . Migraines Mother   . Stroke Mother   . Lung disease Mother   . COPD Mother   . Diabetes Father   . Hypertension Father   . Heart disease Father   . Heart attack Father   . Heart disease Paternal Grandmother   . Diabetes Paternal Grandmother   . Stroke Paternal Grandmother   . Cancer Paternal Grandmother   . Diabetes Paternal Grandfather     Social History Social History  Substance Use Topics  . Smoking status: Former Smoker    Packs/day: 0.25    Years: 10.00    Types: Cigarettes    Quit date: 12/1923  . Smokeless tobacco: Never Used     Comment: QUIT SMOKING 11/2016  . Alcohol use No     Comment: Hasn't had any alcohol "for 2 yrs" (10/17/2016)     Allergies   Alprazolam; Percocet [oxycodone-acetaminophen]; Codeine; Doxycycline; Hydrocodone; Omnicef [cefdinir]; Tramadol; Augmentin [amoxicillin-pot clavulanate]; and Ciprofloxacin   Review of Systems Review of Systems  Constitutional: Negative for chills and fever.       Generalized weakness  HENT: Negative for ear pain and sore throat.  Eyes: Negative for pain and visual  disturbance.  Respiratory: Negative for cough and shortness of breath.   Cardiovascular: Negative for chest pain and palpitations.  Gastrointestinal: Positive for diarrhea, nausea and vomiting. Negative for abdominal pain.  Genitourinary: Negative for dysuria and hematuria.  Musculoskeletal: Negative for arthralgias and back pain.  Skin: Negative for color change and rash.  Neurological: Negative for seizures and syncope.  All other systems reviewed and are negative.    Physical Exam Updated Vital Signs BP (!) 143/66 (BP Location: Right Arm)   Pulse 90   Temp 97.9 F (36.6 C) (Oral)   Resp 20   SpO2 100%   Physical Exam  Constitutional: She is oriented to person, place, and time. She appears well-developed and well-nourished. No distress.  HENT:  Head: Normocephalic and atraumatic.  Mouth/Throat: Oropharynx is clear and moist. No oropharyngeal exudate.  Eyes: Conjunctivae are normal. Pupils are equal, round, and reactive to light.  Neck: Normal range of motion. Neck supple.  Cardiovascular: Normal rate, regular rhythm, normal heart sounds and intact distal pulses.   No murmur heard. Pulmonary/Chest: Effort normal and breath sounds normal. No respiratory distress.  Abdominal: Soft. She exhibits no mass. There is no tenderness. There is no rebound and no guarding.  Musculoskeletal: Normal range of motion. She exhibits no edema.  Neurological: She is alert and oriented to person, place, and time. She displays normal reflexes. Coordination normal.  Skin: Skin is warm and dry.  Psychiatric: She has a normal mood and affect.  Nursing note and vitals reviewed.    ED Treatments / Results  Labs (all labs ordered are listed, but only abnormal results are displayed)  Results for orders placed or performed during the hospital encounter of 03/07/17  CBC with Differential/Platelet  Result Value Ref Range   WBC 12.4 (H) 4.0 - 10.5 K/uL   RBC 3.34 (L) 3.87 - 5.11 MIL/uL   Hemoglobin  10.0 (L) 12.0 - 15.0 g/dL   HCT 30.4 (L) 36.0 - 46.0 %   MCV 91.0 78.0 - 100.0 fL   MCH 29.9 26.0 - 34.0 pg   MCHC 32.9 30.0 - 36.0 g/dL   RDW 13.9 11.5 - 15.5 %   Platelets 403 (H) 150 - 400 K/uL   Neutrophils Relative % 89 %   Neutro Abs 11.0 (H) 1.7 - 7.7 K/uL   Lymphocytes Relative 9 %   Lymphs Abs 1.2 0.7 - 4.0 K/uL   Monocytes Relative 2 %   Monocytes Absolute 0.2 0.1 - 1.0 K/uL   Eosinophils Relative 0 %   Eosinophils Absolute 0.0 0.0 - 0.7 K/uL   Basophils Relative 0 %   Basophils Absolute 0.0 0.0 - 0.1 K/uL  POC CBG, ED  Result Value Ref Range   Glucose-Capillary >600 (HH) 65 - 99 mg/dL  I-Stat Chem 8, ED  Result Value Ref Range   Sodium 129 (L) 135 - 145 mmol/L   Potassium 6.3 (HH) 3.5 - 5.1 mmol/L   Chloride 107 101 - 111 mmol/L   BUN 48 (H) 6 - 20 mg/dL   Creatinine, Ser 2.20 (H) 0.44 - 1.00 mg/dL   Glucose, Bld >700 (HH) 65 - 99 mg/dL   Calcium, Ion 1.19 1.15 - 1.40 mmol/L   TCO2 10 0 - 100 mmol/L   Hemoglobin 10.5 (L) 12.0 - 15.0 g/dL   HCT 31.0 (L) 36.0 - 46.0 %   Comment NOTIFIED PHYSICIAN    No results found.  Procedures Procedures (including critical care time)  Medications Ordered in ED  Medications  insulin regular (NOVOLIN R,HUMULIN R) 250 Units in sodium chloride 0.9 % 250 mL (1 Units/mL) infusion (5.4 Units/hr Intravenous New Bag/Given 03/08/17 0059)  sodium chloride 0.9 % bolus 1,000 mL (1,000 mLs Intravenous New Bag/Given 03/08/17 0044)    And  sodium chloride 0.9 % bolus 1,000 mL (not administered)  amLODipine (NORVASC) tablet 5 mg (not administered)  atorvastatin (LIPITOR) tablet 10 mg (not administered)  carvedilol (COREG) tablet 3.125 mg (not administered)  escitalopram (LEXAPRO) tablet 15 mg (not administered)  pantoprazole (PROTONIX) EC tablet 40 mg (not administered)  traMADol (ULTRAM) tablet 50 mg (not administered)  acetaminophen (TYLENOL) tablet 650 mg (not administered)  levothyroxine (SYNTHROID, LEVOTHROID) tablet 175 mcg (not  administered)  ondansetron (ZOFRAN) injection 4 mg (not administered)  loperamide (IMODIUM) capsule 2 mg (not administered)  dextrose 5 %-0.45 % sodium chloride infusion (not administered)  liver oil-zinc oxide (DESITIN) 40 % ointment (not administered)  enoxaparin (LOVENOX) injection 40 mg (not administered)  0.9 %  sodium chloride infusion (not administered)  sodium chloride 0.9 % bolus 1,000 mL (1,000 mLs Intravenous New Bag/Given 03/08/17 0044)  ondansetron (ZOFRAN) injection 4 mg (4 mg Intravenous Given 03/08/17 0044)  dicyclomine (BENTYL) injection 20 mg (20 mg Intramuscular Given 03/08/17 0044)    MDM Reviewed: previous chart, nursing note and vitals Reviewed previous: labs Interpretation: labs (elevated potassium 6.3 elevated creatinine 2.2, elevated anion gap of 19) Total time providing critical care: 30-74 minutes. This excludes time spent performing separately reportable procedures and services. Consults: admitting MD  multiple fluid boluses and insulin drip started by me in the ED  CRITICAL CARE Performed by: Carlisle Beers Total critical care time: 31 minutes Critical care time was exclusive of separately billable procedures and treating other patients. Critical care was necessary to treat or prevent imminent or life-threatening deterioration. Critical care was time spent personally by me on the following activities: development of treatment plan with patient and/or surrogate as well as nursing, discussions with consultants, evaluation of patient's response to treatment, examination of patient, obtaining history from patient or surrogate, ordering and performing treatments and interventions, ordering and review of laboratory studies, ordering and review of radiographic studies, pulse oximetry and re-evaluation of patient's condition.   Final Clinical Impressions(s) / ED Diagnoses  Diabetic ketoacidosis:  Will admit to inpatient   I personally performed the services  described in this documentation, which was scribed in my presence. The recorded information has been reviewed and is accurate.      Veatrice Kells, MD 03/08/17 [phone]

## 2017-03-08 DIAGNOSIS — E1010 Type 1 diabetes mellitus with ketoacidosis without coma: Secondary | ICD-10-CM | POA: Diagnosis present

## 2017-03-08 DIAGNOSIS — Z87891 Personal history of nicotine dependence: Secondary | ICD-10-CM | POA: Diagnosis not present

## 2017-03-08 DIAGNOSIS — R197 Diarrhea, unspecified: Secondary | ICD-10-CM | POA: Diagnosis not present

## 2017-03-08 DIAGNOSIS — F319 Bipolar disorder, unspecified: Secondary | ICD-10-CM | POA: Diagnosis present

## 2017-03-08 DIAGNOSIS — K529 Noninfective gastroenteritis and colitis, unspecified: Secondary | ICD-10-CM | POA: Diagnosis present

## 2017-03-08 DIAGNOSIS — N183 Chronic kidney disease, stage 3 (moderate): Secondary | ICD-10-CM

## 2017-03-08 DIAGNOSIS — K5900 Constipation, unspecified: Secondary | ICD-10-CM | POA: Diagnosis present

## 2017-03-08 DIAGNOSIS — I5033 Acute on chronic diastolic (congestive) heart failure: Secondary | ICD-10-CM | POA: Diagnosis not present

## 2017-03-08 DIAGNOSIS — Z981 Arthrodesis status: Secondary | ICD-10-CM | POA: Diagnosis not present

## 2017-03-08 DIAGNOSIS — E039 Hypothyroidism, unspecified: Secondary | ICD-10-CM | POA: Diagnosis present

## 2017-03-08 DIAGNOSIS — E538 Deficiency of other specified B group vitamins: Secondary | ICD-10-CM | POA: Diagnosis present

## 2017-03-08 DIAGNOSIS — Z79899 Other long term (current) drug therapy: Secondary | ICD-10-CM | POA: Diagnosis not present

## 2017-03-08 DIAGNOSIS — I129 Hypertensive chronic kidney disease with stage 1 through stage 4 chronic kidney disease, or unspecified chronic kidney disease: Secondary | ICD-10-CM | POA: Diagnosis not present

## 2017-03-08 DIAGNOSIS — I130 Hypertensive heart and chronic kidney disease with heart failure and stage 1 through stage 4 chronic kidney disease, or unspecified chronic kidney disease: Secondary | ICD-10-CM | POA: Diagnosis present

## 2017-03-08 DIAGNOSIS — J449 Chronic obstructive pulmonary disease, unspecified: Secondary | ICD-10-CM | POA: Diagnosis present

## 2017-03-08 DIAGNOSIS — Z794 Long term (current) use of insulin: Secondary | ICD-10-CM | POA: Diagnosis not present

## 2017-03-08 DIAGNOSIS — R112 Nausea with vomiting, unspecified: Secondary | ICD-10-CM | POA: Diagnosis present

## 2017-03-08 DIAGNOSIS — Z23 Encounter for immunization: Secondary | ICD-10-CM | POA: Diagnosis not present

## 2017-03-08 DIAGNOSIS — R1110 Vomiting, unspecified: Secondary | ICD-10-CM | POA: Diagnosis present

## 2017-03-08 DIAGNOSIS — E10649 Type 1 diabetes mellitus with hypoglycemia without coma: Secondary | ICD-10-CM | POA: Diagnosis not present

## 2017-03-08 DIAGNOSIS — N179 Acute kidney failure, unspecified: Secondary | ICD-10-CM | POA: Diagnosis present

## 2017-03-08 DIAGNOSIS — E785 Hyperlipidemia, unspecified: Secondary | ICD-10-CM | POA: Diagnosis present

## 2017-03-08 DIAGNOSIS — E875 Hyperkalemia: Secondary | ICD-10-CM

## 2017-03-08 DIAGNOSIS — K219 Gastro-esophageal reflux disease without esophagitis: Secondary | ICD-10-CM | POA: Diagnosis present

## 2017-03-08 DIAGNOSIS — E1011 Type 1 diabetes mellitus with ketoacidosis with coma: Secondary | ICD-10-CM | POA: Diagnosis not present

## 2017-03-08 DIAGNOSIS — E1022 Type 1 diabetes mellitus with diabetic chronic kidney disease: Secondary | ICD-10-CM | POA: Diagnosis present

## 2017-03-08 DIAGNOSIS — E7800 Pure hypercholesterolemia, unspecified: Secondary | ICD-10-CM | POA: Diagnosis present

## 2017-03-08 DIAGNOSIS — D631 Anemia in chronic kidney disease: Secondary | ICD-10-CM | POA: Diagnosis present

## 2017-03-08 DIAGNOSIS — Z8673 Personal history of transient ischemic attack (TIA), and cerebral infarction without residual deficits: Secondary | ICD-10-CM | POA: Diagnosis not present

## 2017-03-08 LAB — BASIC METABOLIC PANEL
Anion gap: 13 (ref 5–15)
Anion gap: 11 (ref 5–15)
Anion gap: 8 (ref 5–15)
BUN: 44 mg/dL — AB (ref 6–20)
BUN: 43 mg/dL — AB (ref 6–20)
BUN: 41 mg/dL — AB (ref 6–20)
CALCIUM: 7.6 mg/dL — AB (ref 8.9–10.3)
CALCIUM: 7.5 mg/dL — AB (ref 8.9–10.3)
CALCIUM: 7.6 mg/dL — AB (ref 8.9–10.3)
CHLORIDE: 109 mmol/L (ref 101–111)
CHLORIDE: 112 mmol/L — AB (ref 101–111)
CHLORIDE: 113 mmol/L — AB (ref 101–111)
CO2: 10 mmol/L — ABNORMAL LOW (ref 22–32)
CO2: 12 mmol/L — AB (ref 22–32)
CO2: 16 mmol/L — AB (ref 22–32)
CREATININE: 2.33 mg/dL — AB (ref 0.44–1.00)
CREATININE: 2.15 mg/dL — AB (ref 0.44–1.00)
CREATININE: 1.97 mg/dL — AB (ref 0.44–1.00)
GFR calc Af Amer: 26 mL/min — ABNORMAL LOW (ref >60)
GFR calc Af Amer: 29 mL/min — ABNORMAL LOW (ref >60)
GFR calc non Af Amer: 20 mL/min — ABNORMAL LOW (ref >60)
GFR calc non Af Amer: 22 mL/min — ABNORMAL LOW (ref >60)
GFR calc non Af Amer: 25 mL/min — ABNORMAL LOW (ref >60)
GFR, EST AFRICAN AMERICAN: 23 mL/min — AB (ref >60)
Glucose, Bld: 600 mg/dL (ref 65–99)
Glucose, Bld: 335 mg/dL — ABNORMAL HIGH (ref 65–99)
Glucose, Bld: 148 mg/dL — ABNORMAL HIGH (ref 65–99)
Potassium: 4.7 mmol/L (ref 3.5–5.1)
Potassium: 4 mmol/L (ref 3.5–5.1)
Potassium: 3.7 mmol/L (ref 3.5–5.1)
SODIUM: 132 mmol/L — AB (ref 135–145)
SODIUM: 135 mmol/L (ref 135–145)
SODIUM: 137 mmol/L (ref 135–145)

## 2017-03-08 LAB — URINALYSIS, ROUTINE W REFLEX MICROSCOPIC
BILIRUBIN URINE: NEGATIVE
Glucose, UA: >=500 mg/dL — AB
Ketones, ur: 20 mg/dL — AB
LEUKOCYTES UA: NEGATIVE
NITRITE: NEGATIVE
Specific Gravity, Urine: 1.017 (ref 1.005–1.030)
pH: 5 (ref 5.0–8.0)

## 2017-03-08 LAB — CBG MONITORING, ED: Glucose-Capillary: 588 mg/dL (ref 65–99)

## 2017-03-08 LAB — CBC WITH DIFFERENTIAL/PLATELET
Basophils Absolute: 0 10*3/uL (ref 0.0–0.1)
Basophils Relative: 0 %
EOS ABS: 0 10*3/uL (ref 0.0–0.7)
EOS PCT: 0 %
HCT: 30.4 % — ABNORMAL LOW (ref 36.0–46.0)
Hemoglobin: 10 g/dL — ABNORMAL LOW (ref 12.0–15.0)
LYMPHS ABS: 1.2 10*3/uL (ref 0.7–4.0)
Lymphocytes Relative: 9 %
MCH: 29.9 pg (ref 26.0–34.0)
MCHC: 32.9 g/dL (ref 30.0–36.0)
MCV: 91 fL (ref 78.0–100.0)
MONO ABS: 0.2 10*3/uL (ref 0.1–1.0)
MONOS PCT: 2 %
Neutro Abs: 11 10*3/uL — ABNORMAL HIGH (ref 1.7–7.7)
Neutrophils Relative %: 89 %
PLATELETS: 403 10*3/uL — AB (ref 150–400)
RBC: 3.34 MIL/uL — ABNORMAL LOW (ref 3.87–5.11)
RDW: 13.9 % (ref 11.5–15.5)
WBC: 12.4 10*3/uL — AB (ref 4.0–10.5)

## 2017-03-08 LAB — GLUCOSE, CAPILLARY
GLUCOSE-CAPILLARY: 534 mg/dL — AB (ref 65–99)
GLUCOSE-CAPILLARY: 477 mg/dL — AB (ref 65–99)
GLUCOSE-CAPILLARY: 407 mg/dL — AB (ref 65–99)
GLUCOSE-CAPILLARY: 289 mg/dL — AB (ref 65–99)
GLUCOSE-CAPILLARY: 123 mg/dL — AB (ref 65–99)
GLUCOSE-CAPILLARY: 77 mg/dL (ref 65–99)
GLUCOSE-CAPILLARY: 54 mg/dL — AB (ref 65–99)
GLUCOSE-CAPILLARY: 90 mg/dL (ref 65–99)
GLUCOSE-CAPILLARY: 89 mg/dL (ref 65–99)
GLUCOSE-CAPILLARY: 138 mg/dL — AB (ref 65–99)
Glucose-Capillary: 378 mg/dL — ABNORMAL HIGH (ref 65–99)
Glucose-Capillary: 288 mg/dL — ABNORMAL HIGH (ref 65–99)
Glucose-Capillary: 205 mg/dL — ABNORMAL HIGH (ref 65–99)
Glucose-Capillary: 153 mg/dL — ABNORMAL HIGH (ref 65–99)
Glucose-Capillary: 77 mg/dL (ref 65–99)
Glucose-Capillary: 63 mg/dL — ABNORMAL LOW (ref 65–99)
Glucose-Capillary: 270 mg/dL — ABNORMAL HIGH (ref 65–99)

## 2017-03-08 LAB — I-STAT CHEM 8, ED
BUN: 48 mg/dL — AB (ref 6–20)
CALCIUM ION: 1.19 mmol/L (ref 1.15–1.40)
CHLORIDE: 107 mmol/L (ref 101–111)
CREATININE: 2.2 mg/dL — AB (ref 0.44–1.00)
Glucose, Bld: >700 mg/dL (ref 65–99)
HEMATOCRIT: 31 % — AB (ref 36.0–46.0)
Hemoglobin: 10.5 g/dL — ABNORMAL LOW (ref 12.0–15.0)
Potassium: 6.3 mmol/L (ref 3.5–5.1)
Sodium: 129 mmol/L — ABNORMAL LOW (ref 135–145)
TCO2: 10 mmol/L (ref 0–100)

## 2017-03-08 LAB — MRSA PCR SCREENING: MRSA by PCR: NEGATIVE

## 2017-03-08 MED ORDER — POTASSIUM CHLORIDE CRYS ER 10 MEQ PO TBCR
20.0000 meq | EXTENDED_RELEASE_TABLET | Freq: Every day | ORAL | Status: DC
  Administered 2017-03-08 – 2017-03-12 (×5): 20 meq via ORAL
  Filled 2017-03-08 (×5): qty 2

## 2017-03-08 MED ORDER — INSULIN ASPART 100 UNIT/ML ~~LOC~~ SOLN
0.0000 [IU] | Freq: Every day | SUBCUTANEOUS | Status: DC
  Administered 2017-03-08: 3 [IU] via SUBCUTANEOUS
  Administered 2017-03-09 – 2017-03-10 (×2): 2 [IU] via SUBCUTANEOUS

## 2017-03-08 MED ORDER — SODIUM CHLORIDE 0.9 % IV SOLN
INTRAVENOUS | Status: DC
  Administered 2017-03-08: 5.4 [IU]/h via INTRAVENOUS
  Filled 2017-03-08: qty 2.5

## 2017-03-08 MED ORDER — SODIUM CHLORIDE 0.9 % IV SOLN
INTRAVENOUS | Status: DC
  Administered 2017-03-08: 1000 mL via INTRAVENOUS
  Administered 2017-03-09 (×3): via INTRAVENOUS

## 2017-03-08 MED ORDER — ATORVASTATIN CALCIUM 10 MG PO TABS
10.0000 mg | ORAL_TABLET | Freq: Every day | ORAL | Status: DC
  Administered 2017-03-08 – 2017-03-11 (×4): 10 mg via ORAL
  Filled 2017-03-08 (×4): qty 1

## 2017-03-08 MED ORDER — ZINC OXIDE 40 % EX OINT: TOPICAL_OINTMENT | CUTANEOUS | Status: DC | PRN

## 2017-03-08 MED ORDER — LOPERAMIDE HCL 2 MG PO CAPS: 2.0000 mg | ORAL_CAPSULE | ORAL | Status: DC | PRN

## 2017-03-08 MED ORDER — ENOXAPARIN SODIUM 30 MG/0.3ML ~~LOC~~ SOLN
30.0000 mg | SUBCUTANEOUS | Status: DC
  Administered 2017-03-09 – 2017-03-12 (×4): 30 mg via SUBCUTANEOUS
  Filled 2017-03-08 (×4): qty 0.3

## 2017-03-08 MED ORDER — CARVEDILOL 3.125 MG PO TABS
3.1250 mg | ORAL_TABLET | Freq: Two times a day (BID) | ORAL | Status: DC
  Administered 2017-03-08 – 2017-03-12 (×9): 3.125 mg via ORAL
  Filled 2017-03-08 (×9): qty 1

## 2017-03-08 MED ORDER — PANTOPRAZOLE SODIUM 40 MG PO TBEC
40.0000 mg | DELAYED_RELEASE_TABLET | Freq: Every day | ORAL | Status: DC
  Administered 2017-03-08 – 2017-03-12 (×5): 40 mg via ORAL
  Filled 2017-03-08 (×5): qty 1

## 2017-03-08 MED ORDER — DEXTROSE 50 % IV SOLN
INTRAVENOUS | Status: AC
  Administered 2017-03-08: 18 mL
  Administered 2017-03-08: 15 mL
  Filled 2017-03-08: qty 50

## 2017-03-08 MED ORDER — SODIUM CHLORIDE 0.9 % IV BOLUS (SEPSIS)
1000.0000 mL | Freq: Once | INTRAVENOUS | Status: AC
Start: 2017-03-08 — End: 2017-03-08
  Administered 2017-03-08: 1000 mL via INTRAVENOUS

## 2017-03-08 MED ORDER — ESCITALOPRAM OXALATE 10 MG PO TABS
15.0000 mg | ORAL_TABLET | Freq: Every day | ORAL | Status: DC
  Administered 2017-03-08 – 2017-03-12 (×5): 15 mg via ORAL
  Filled 2017-03-08 (×3): qty 2
  Filled 2017-03-08 (×2): qty 1.5

## 2017-03-08 MED ORDER — ENOXAPARIN SODIUM 40 MG/0.4ML ~~LOC~~ SOLN
40.0000 mg | SUBCUTANEOUS | Status: DC
  Administered 2017-03-08: 40 mg via SUBCUTANEOUS
  Filled 2017-03-08: qty 0.4

## 2017-03-08 MED ORDER — ONDANSETRON HCL 4 MG/2ML IJ SOLN
4.0000 mg | Freq: Four times a day (QID) | INTRAMUSCULAR | Status: DC | PRN
  Administered 2017-03-08 – 2017-03-11 (×2): 4 mg via INTRAVENOUS
  Filled 2017-03-08 (×2): qty 2

## 2017-03-08 MED ORDER — INSULIN GLARGINE 100 UNIT/ML ~~LOC~~ SOLN
4.0000 [IU] | Freq: Every day | SUBCUTANEOUS | Status: DC
  Administered 2017-03-08 – 2017-03-11 (×4): 4 [IU] via SUBCUTANEOUS
  Filled 2017-03-08 (×5): qty 0.04

## 2017-03-08 MED ORDER — DEXTROSE-NACL 5-0.45 % IV SOLN
INTRAVENOUS | Status: DC
  Administered 2017-03-08: 1000 mL via INTRAVENOUS

## 2017-03-08 MED ORDER — TRAMADOL HCL 50 MG PO TABS: 50.0000 mg | ORAL_TABLET | Freq: Four times a day (QID) | ORAL | Status: DC | PRN

## 2017-03-08 MED ORDER — SODIUM CHLORIDE 0.9 % IV SOLN
INTRAVENOUS | Status: DC
  Administered 2017-03-08: 125 mL via INTRAVENOUS

## 2017-03-08 MED ORDER — SODIUM CHLORIDE 0.9 % IV SOLN: INTRAVENOUS | Status: DC

## 2017-03-08 MED ORDER — DEXTROSE-NACL 5-0.45 % IV SOLN: INTRAVENOUS | Status: DC

## 2017-03-08 MED ORDER — AMLODIPINE BESYLATE 5 MG PO TABS
5.0000 mg | ORAL_TABLET | Freq: Every day | ORAL | Status: DC
  Administered 2017-03-08 – 2017-03-12 (×5): 5 mg via ORAL
  Filled 2017-03-08 (×5): qty 1

## 2017-03-08 MED ORDER — SODIUM CHLORIDE 0.9 % IV BOLUS (SEPSIS)
1000.0000 mL | Freq: Once | INTRAVENOUS | Status: AC
  Administered 2017-03-08: 1000 mL via INTRAVENOUS

## 2017-03-08 MED ORDER — ACETAMINOPHEN 325 MG PO TABS
650.0000 mg | ORAL_TABLET | Freq: Four times a day (QID) | ORAL | Status: DC | PRN
  Administered 2017-03-10: 650 mg via ORAL
  Filled 2017-03-08: qty 2

## 2017-03-08 MED ORDER — INSULIN ASPART 100 UNIT/ML ~~LOC~~ SOLN
0.0000 [IU] | Freq: Three times a day (TID) | SUBCUTANEOUS | Status: DC
  Administered 2017-03-09: 3 [IU] via SUBCUTANEOUS
  Administered 2017-03-10: 1 [IU] via SUBCUTANEOUS
  Administered 2017-03-10: 2 [IU] via SUBCUTANEOUS
  Administered 2017-03-10: 1 [IU] via SUBCUTANEOUS
  Administered 2017-03-11: 3 [IU] via SUBCUTANEOUS
  Administered 2017-03-11: 5 [IU] via SUBCUTANEOUS
  Administered 2017-03-11: 3 [IU] via SUBCUTANEOUS
  Administered 2017-03-12: 2 [IU] via SUBCUTANEOUS
  Administered 2017-03-12: 1 [IU] via SUBCUTANEOUS

## 2017-03-08 MED ORDER — LEVOTHYROXINE SODIUM 50 MCG PO TABS
175.0000 ug | ORAL_TABLET | Freq: Every day | ORAL | Status: DC
  Administered 2017-03-08 – 2017-03-12 (×5): 175 ug via ORAL
  Filled 2017-03-08 (×5): qty 1

## 2017-03-08 NOTE — Progress Notes (Signed)
PROGRESS NOTE  Cassandra AbbeBillie A Nasworthy GNF:[id number] DOB: 1946-07-13 DOA: 03/07/2017 PCP: Irving CopasHACKER,ROBERT KELLER, MD  HPI/Recap of past 1024 hours:  71 year old female with past mental history of diabetes mellitus with recurrent admissions for DKA plus stage III chronic kidney disease presented to the emergency room on the night of 3/23 for several days of nausea vomiting and diarrhea and her blood sugar was found to be greater than 700. Patient was admitted to the hospitalist service for DKA, secondary acute kidney injury and hyperkalemia with potassium of 6.3. Following DKA protocol with IV fluids and insulin, sugars continue to trend down and anion gap resolved.  The afternoon of 3/24, DKA resolved, and she actually had episodes of hypoglycemia. Insulin drip discontinued. Patient herself doing okay states that she's feeling better. She denies any pain. She is not sure what happened.  Assessment/Plan: Principal Problem:   Diabetic ketoacidosis without coma associated with type 1 diabetes mellitus (HCC): Unclear etiology.? Gastroenteritis. Continue aggressive fluid resuscitation, but has stopped insulin drip. Allow by mouth. Active Problems: Acute kidney injury with CKD (chronic kidney disease) stage III colon secondary to DKA. With aggressive fluid resuscitation, should get back down to baseline    Hyperkalemia: Secondary to electrolyte shifts from DKA. Potassium now within normal range   Nausea vomiting and diarrhea: Unclear etiology. Gastroenteritis? We'll start to allow patient to eat and monitor closely  Lactic acidosis: Given DKA and hypotension, this is not sepsis, this is  Code Status: Full code   Family Communication: Left message for family   Disposition Plan: Continue in stepdown, likely transfer later today if CBG stable    Consultants:  None   Procedures:  None   Antimicrobials:  None   DVT prophylaxis:  Lovenox   Objective: Vitals:   03/08/17 0924 03/08/17 0925  03/08/17 1000 03/08/17 1200  BP: (!) 140/52 (!) 140/52 (!) 135/54   Pulse:  82    Resp:   10   Temp:    98.4 F (36.9 C)  TempSrc:    Oral  SpO2:   98%   Weight:      Height:        Intake/Output Summary (Last 24 hours) at 03/08/17 1414 Last data filed at 03/08/17 1000  Gross per 24 hour  Intake          3984.59 ml  Output              250 ml  Net          3734.59 ml   Filed Weights   03/08/17 0300  Weight: 49.6 kg (109 lb 5.6 oz)    Exam:   General:  Alert and oriented 3, no acute distress   Cardiovascular: Regular rate and rhythm, S1-S2   Respiratory: Clear to auscultation bilaterally   Abdomen: Soft, nontender, nondistended, hypoactive bowel sounds   Musculoskeletal: No clubbing or cyanosis or edema   Skin: No skin breaks, tears or lesions  Psychiatry: Patient is appropriate, no evidence of psychoses    Data Reviewed: CBC:  Recent Labs Lab 03/08/17 0009 03/08/17 0020  WBC 12.4*  --   NEUTROABS 11.0*  --   HGB 10.0* 10.5*  HCT 30.4* 31.0*  MCV 91.0  --   PLT 403*  --    Basic Metabolic Panel:  Recent Labs Lab 03/08/17 0020 03/08/17 0313 03/08/17 0727 03/08/17 1038  NA 129* 132* 135 137  K 6.3* 4.7 4.0 3.7  CL 107 109 112* 113*  CO2  --  10* 12* 16*  GLUCOSE >700* 600* 335* 148*  BUN 48* 44* 43* 41*  CREATININE 2.20* 2.33* 2.15* 1.97*  CALCIUM  --  7.6* 7.5* 7.6*   GFR: Estimated Creatinine Clearance: 21.1 mL/min (A) (by C-G formula based on SCr of 1.97 mg/dL (H)). Liver Function Tests: No results for input(s): AST, ALT, ALKPHOS, BILITOT, PROT, ALBUMIN in the last 168 hours. No results for input(s): LIPASE, AMYLASE in the last 168 hours. No results for input(s): AMMONIA in the last 168 hours. Coagulation Profile: No results for input(s): INR, PROTIME in the last 168 hours. Cardiac Enzymes: No results for input(s): CKTOTAL, CKMB, CKMBINDEX, TROPONINI in the last 168 hours. BNP (last 3 results) No results for input(s): PROBNP in the  last 8760 hours. HbA1C: No results for input(s): HGBA1C in the last 72 hours. CBG:  Recent Labs Lab 03/08/17 1018 03/08/17 1106 03/08/17 1225 03/08/17 1311 03/08/17 1346  GLUCAP 153* 123* 77 54* 77   Lipid Profile: No results for input(s): CHOL, HDL, LDLCALC, TRIG, CHOLHDL, LDLDIRECT in the last 72 hours. Thyroid Function Tests: No results for input(s): TSH, T4TOTAL, FREET4, T3FREE, THYROIDAB in the last 72 hours. Anemia Panel: No results for input(s): VITAMINB12, FOLATE, FERRITIN, TIBC, IRON, RETICCTPCT in the last 72 hours. Urine analysis:    Component Value Date/Time   COLORURINE AMBER (A) 03/08/2017 0252   APPEARANCEUR HAZY (A) 03/08/2017 0252   APPEARANCEUR Clear 06/15/2014 1508   LABSPEC 1.017 03/08/2017 0252   LABSPEC 1.010 06/15/2014 1508   PHURINE 5.0 03/08/2017 0252   GLUCOSEU >=500 (A) 03/08/2017 0252   GLUCOSEU Negative 06/15/2014 1508   HGBUR MODERATE (A) 03/08/2017 0252   BILIRUBINUR NEGATIVE 03/08/2017 0252   BILIRUBINUR Negative 06/15/2014 1508   KETONESUR 20 (A) 03/08/2017 0252   PROTEINUR >=300 (A) 03/08/2017 0252   UROBILINOGEN 0.2 09/14/2015 1617   NITRITE NEGATIVE 03/08/2017 0252   LEUKOCYTESUR NEGATIVE 03/08/2017 0252   LEUKOCYTESUR Negative 06/15/2014 1508   Sepsis Labs: @LABRCNTIP (procalcitonin:4,lacticidven:4)  ) Recent Results (from the past 240 hour(s))  MRSA PCR Screening     Status: None   Collection Time: 03/08/17  2:44 AM  Result Value Ref Range Status   MRSA by PCR NEGATIVE NEGATIVE Final    Comment:        The GeneXpert MRSA Assay (FDA approved for NASAL specimens only), is one component of a comprehensive MRSA colonization surveillance program. It is not intended to diagnose MRSA infection nor to guide or monitor treatment for MRSA infections.       Studies: No results found.  Scheduled Meds: . amLODipine  5 mg Oral Daily  . atorvastatin  10 mg Oral q1800  . carvedilol  3.125 mg Oral BID WC  . [START ON  03/09/2017] enoxaparin (LOVENOX) injection  30 mg Subcutaneous Q24H  . escitalopram  15 mg Oral Daily  . insulin aspart  0-5 Units Subcutaneous QHS  . insulin aspart  0-9 Units Subcutaneous TID WC  . Insulin Glargine  4 Units Subcutaneous Q2200  . levothyroxine  175 mcg Oral QAC breakfast  . pantoprazole  40 mg Oral Daily  . Potassium Chloride ER  20 mEq Oral Daily    Continuous Infusions: . sodium chloride       LOS: 0 days     Hollice Espy, MD [facility] Pager [phone]  If 7PM-7AM, please contact night-coverage [email or web address] Password [facility] 03/08/2017, 2:14 PM

## 2017-03-08 NOTE — Progress Notes (Addendum)
Pt. CBG at 1311 was 54. Per protocol, D50 18ml was given and insulin gtt. Turned off. CBG Rechecked and insulin gtt was resumed at 0.3 per glucose  Stabilizer protocol. MD made aware. RN will continue to monitor.

## 2017-03-08 NOTE — ED Notes (Signed)
Notified EDP,Palumbo,MD., made aware of pt. I-stat Chem 8, potassium 6.3 and glucose >than 700 and RN,Oscar made aware.

## 2017-03-08 NOTE — H&P (Signed)
History and Physical    [name] MRN:[id number] DOB: 06/28/1946 DOA: 03/07/2017  PCP: THACKER,ROBERT KELLER, MD  Patient coming from: Home  I have personally briefly reviewed patient's old medical records in [facility]  Chief Complaint: Vomiting, diarrhea  HPI: Cassandra [name] is a 71 y.o. female with medical history significant of DM, recurrent admits for DKA, h/o C.Diff last in Nov, HTN, CKD stage 3.  Patient presents to the ED for vomiting, diarrhea, and generalized weakness.  This morning she woke with N/V/D and generalized weakness.  BGL was 500 this morning.  She says that she missed last 2 doses of insulin due to not eating anything due to being constipated.  No chest nor abdominal pain.  Does have rectal pain due to diarrhea.  No blood in stool.  ED Course: Patient with DKA, K of 6.2.  Creat 2.2 up from baseline 1.5.   Review of Systems: As per HPI otherwise 10 point review of systems negative.   Past Medical History:  Diagnosis Date  . Allergic rhinitis   . Anemia   . Anxiety   . Arthritis    "mostly feet, hands" (10/17/2016)  . Benign hypertension with CKD (chronic kidney disease) stage III   . Benign paroxysmal positional vertigo   . Bipolar disorder (HCC)   . Broken finger   . Broken shoulder   . Broken toes   . Cervicalgia   . CHF (congestive heart failure) (HCC) 07/2016  . CKD (chronic kidney disease) stage 3, GFR 30-59 ml/min   . COPD (chronic obstructive pulmonary disease) (HCC)   . Depression   . Elevated liver enzymes Hep B/C neg 2014  . Fall at nursing home 10/15/2016  . GERD (gastroesophageal reflux disease)   . Glaucoma   . High cholesterol   . History of alcohol use   . History of blood transfusion 09/2016   "blood got really really low"  . Hypertension   . Hypothyroidism   . Interstitial cystitis    bladder stretched every 9 months  . Migraines    "pretty much qd" (10/17/2016)  . Pneumonia 12/2016  . Psoriasis   . Stroke (HCC)  05/16/2016   Left occipital and thalamic, right hippocampal  . Thyroid disease   . Tobacco use   . 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 Vitamin B12 deficiency     Past Surgical History:  Procedure Laterality Date  . ANTERIOR CERVICAL DECOMP/DISCECTOMY FUSION  ~ 2009  . APPENDECTOMY    . BACK SURGERY    . BLADDER SUSPENSION    . CATARACT EXTRACTION W/ INTRAOCULAR LENS  IMPLANT, BILATERAL Bilateral   . DILATION AND CURETTAGE OF UTERUS    . HERNIA REPAIR    . LAPAROSCOPIC CHOLECYSTECTOMY    . TONSILLECTOMY    . TUBAL LIGATION    . VAGINAL HYSTERECTOMY     with oophorectomy     reports that she quit smoking about 93 years ago. Her smoking use included Cigarettes. She has a 2.50 pack-year smoking history. She has never used smokeless tobacco. She reports that she does not drink alcohol or use drugs.  Allergies  Allergen Reactions  . Alprazolam Other (See Comments)    Family preference, for patient to not take med  . Percocet [Oxycodone-Acetaminophen] Other (See Comments)    Family preference, for patient to not take med  . Codeine Diarrhea and Nausea And Vomiting  . Doxycycline Diarrhea and Nausea And Vomiting  .   Hydrocodone Nausea And Vomiting  . Omnicef [Cefdinir] Nausea Only and Other (See Comments)    Constipation, tolerated Zosyn  . Tramadol Other (See Comments)    Pt hallucinates when taking medication. Family request not to give medication to pt   . Augmentin [Amoxicillin-Pot Clavulanate] Hives and Rash    Has patient had a PCN reaction causing immediate rash, facial/tongue/throat swelling, SOB or lightheadedness with hypotension: Yes Has patient had a PCN reaction causing severe rash involving mucus membranes or skin necrosis: Yes Did a PCN reaction that required hospitalization No Did PCN reaction occurring within the last 10 years: Yes If all of the above answers are "NO", then may proceed with Cephalosporin use.  Pt states she has taken  penicillin since, and was ok with it...  . Ciprofloxacin Hives    Tolerated LVQ in 12/2016    Family History  Problem Relation Age of Onset  . Alcohol abuse Mother   . Arthritis Mother   . Asthma Mother   . Cancer Mother     colon cancer  . Hypertension Mother   . Migraines Mother   . Stroke Mother   . Lung disease Mother   . COPD Mother   . Diabetes Father   . Hypertension Father   . Heart disease Father   . Heart attack Father   . Heart disease Paternal Grandmother   . Diabetes Paternal Grandmother   . Stroke Paternal Grandmother   . Cancer Paternal Grandmother   . Diabetes Paternal Grandfather      Prior to Admission medications   Medication Sig Start Date End Date Taking? Authorizing Provider  acetaminophen (TYLENOL) 325 MG tablet Take 650 mg by mouth every 6 (six) hours as needed for mild pain or fever.     Historical Provider, MD  Amino Acids-Protein Hydrolys (FEEDING SUPPLEMENT, PRO-STAT SUGAR FREE 64,) LIQD Take 30 mLs by mouth 2 (two) times daily. Patient not taking: Reported on 01/04/2017 12/27/16   Jonetta Osgood, MD  amLODipine (NORVASC) 5 MG tablet Take 1 tablet (5 mg total) by mouth daily. 12/27/16   Shanker Kristeen Mans, MD  atorvastatin (LIPITOR) 10 MG tablet Take 1 tablet (10 mg total) by mouth daily at 6 PM. 12/27/16   Shanker Kristeen Mans, MD  carvedilol (COREG) 3.125 MG tablet Take 1 tablet (3.125 mg total) by mouth 2 (two) times daily with a meal. 12/27/16   Shanker Kristeen Mans, MD  cyanocobalamin (,VITAMIN B-12,) 1000 MCG/ML injection Inject 1 mL (1,000 mcg total) into the muscle once a week. 01/07/17 03/12/17  Srikar Sudini, MD  escitalopram (LEXAPRO) 10 MG tablet Take 1.5 tablets (15 mg total) by mouth daily. 12/27/16   Shanker Kristeen Mans, MD  furosemide (LASIX) 20 MG tablet Take 1 tablet (20 mg total) by mouth daily. 01/08/17   Srikar Sudini, MD  glucose monitoring kit (FREESTYLE) monitoring kit 1 each by Does not apply route 4 (four) times daily - after meals and at  bedtime. 1 month Diabetic Testing Supplies for QAC-QHS accuchecks. 12/27/16   Shanker Kristeen Mans, MD  Insulin Glargine (LANTUS) 100 UNIT/ML Solostar Pen Inject 4 Units into the skin daily at 10 pm. Patient taking differently: Inject 4 Units into the skin daily at 10 pm. Per sliding scale 12/27/16   Jonetta Osgood, MD  insulin lispro (HUMALOG JUNIOR KWIKPEN) 100 UNIT/ML KiwkPen Inject 0.01-0.05 mLs (1-5 Units total) into the skin 3 (three) times daily before meals. For CBGs < 150 = 0 units; 151 - 200 =  1 units, 201 - 250 = 2 units, 251 - 300 = 3 units; 301-350 = 4 units; 351-400 = 5 units; > 400 notify the provider.  DO NOT GIVE IF PATIENT NOT EATING MEA Patient not taking: Reported on 01/04/2017 12/27/16   Shanker M Ghimire, MD  Insulin Pen Needle 32G X 8 MM MISC Use as directed 12/27/16   Shanker M Ghimire, MD  levothyroxine (SYNTHROID, LEVOTHROID) 175 MCG tablet Take 1 tablet (175 mcg total) by mouth daily before breakfast. 01/07/17   Srikar Sudini, MD  pantoprazole (PROTONIX) 40 MG tablet Take 1 tablet (40 mg total) by mouth daily. 01/08/17   Srikar Sudini, MD  polyethylene glycol (MIRALAX / GLYCOLAX) packet Take 17 g by mouth daily as needed for mild constipation. 01/07/17   Srikar Sudini, MD  potassium chloride 20 MEQ TBCR Take 20 mEq by mouth daily. 01/08/17   Srikar Sudini, MD  senna-docusate (SENOKOT-S) 8.6-50 MG tablet Take 2 tablets by mouth 2 (two) times daily. 01/07/17   Srikar Sudini, MD  traMADol (ULTRAM) 50 MG tablet Take 1 tablet (50 mg total) by mouth every 6 (six) hours as needed. 01/08/17   Srikar Sudini, MD    Physical Exam: Vitals:   03/07/17 2242 03/07/17 2341  BP: (!) 143/66 (!) 141/70  Pulse: 90 88  Resp: 20 18  Temp: 97.9 F (36.6 C) 98 F (36.7 C)  TempSrc: Oral Oral  SpO2: 100% 100%    Constitutional: NAD, calm, comfortable Eyes: PERRL, lids and conjunctivae normal ENMT: Mucous membranes are moist. Posterior pharynx clear of any exudate or lesions.Normal dentition.    Neck: normal, supple, no masses, no thyromegaly Respiratory: clear to auscultation bilaterally, no wheezing, no crackles. Normal respiratory effort. No accessory muscle use.  Cardiovascular: Regular rate and rhythm, no murmurs / rubs / gallops. No extremity edema. 2+ pedal pulses. No carotid bruits.  Abdomen: no tenderness, no masses palpated. No hepatosplenomegaly. Bowel sounds positive.  Musculoskeletal: no clubbing / cyanosis. No joint deformity upper and lower extremities. Good ROM, no contractures. Normal muscle tone.  Skin: no rashes, lesions, ulcers. No induration Neurologic: CN 2-12 grossly intact. Sensation intact, DTR normal. Strength 5/5 in all 4.  Psychiatric: Normal judgment and insight. Alert and oriented x 3. Normal mood.    Labs on Admission: I have personally reviewed following labs and imaging studies  CBC:  Recent Labs Lab 03/08/17 0009 03/08/17 0020  WBC 12.4*  --   NEUTROABS 11.0*  --   HGB 10.0* 10.5*  HCT 30.4* 31.0*  MCV 91.0  --   PLT 403*  --    Basic Metabolic Panel:  Recent Labs Lab 03/08/17 0020  NA 129*  K 6.3*  CL 107  GLUCOSE >700*  BUN 48*  CREATININE 2.20*   GFR: CrCl cannot be calculated (Unknown ideal weight.). Liver Function Tests: No results for input(s): AST, ALT, ALKPHOS, BILITOT, PROT, ALBUMIN in the last 168 hours. No results for input(s): LIPASE, AMYLASE in the last 168 hours. No results for input(s): AMMONIA in the last 168 hours. Coagulation Profile: No results for input(s): INR, PROTIME in the last 168 hours. Cardiac Enzymes: No results for input(s): CKTOTAL, CKMB, CKMBINDEX, TROPONINI in the last 168 hours. BNP (last 3 results) No results for input(s): PROBNP in the last 8760 hours. HbA1C: No results for input(s): HGBA1C in the last 72 hours. CBG:  Recent Labs Lab 03/07/17 2344  GLUCAP >600*   Lipid Profile: No results for input(s): CHOL, HDL, LDLCALC, TRIG, CHOLHDL, LDLDIRECT in   the last 72 hours. Thyroid  Function Tests: No results for input(s): TSH, T4TOTAL, FREET4, T3FREE, THYROIDAB in the last 72 hours. Anemia Panel: No results for input(s): VITAMINB12, FOLATE, FERRITIN, TIBC, IRON, RETICCTPCT in the last 72 hours. Urine analysis:    Component Value Date/Time   COLORURINE YELLOW 12/23/2016 0807   APPEARANCEUR CLEAR 12/23/2016 0807   APPEARANCEUR Clear 06/15/2014 1508   LABSPEC 1.010 12/23/2016 0807   LABSPEC 1.010 06/15/2014 1508   PHURINE 7.0 12/23/2016 0807   GLUCOSEU 50 (A) 12/23/2016 0807   GLUCOSEU Negative 06/15/2014 1508   HGBUR NEGATIVE 12/23/2016 0807   BILIRUBINUR NEGATIVE 12/23/2016 0807   BILIRUBINUR Negative 06/15/2014 1508   KETONESUR NEGATIVE 12/23/2016 0807   PROTEINUR >=300 (A) 12/23/2016 0807   UROBILINOGEN 0.2 09/14/2015 1617   NITRITE NEGATIVE 12/23/2016 0807   LEUKOCYTESUR NEGATIVE 12/23/2016 0807   LEUKOCYTESUR Negative 06/15/2014 1508    Radiological Exams on Admission: No results found.  EKG: Independently reviewed.  Assessment/Plan Principal Problem:   Diabetic ketoacidosis with coma associated with type 1 diabetes mellitus (HCC) Active Problems:   Benign hypertension with CKD (chronic kidney disease) stage III   DKA, type 1 (HCC)   Hyperkalemia   Nausea vomiting and diarrhea    1. DKA - 1. DKA pathway 2. Insulin gtt 3. IVF bolus and fluids per pathway 4. BMP Q4H 2. Hyperkalemia - 1. Being repeated due to possible hemolysis 2. However I would expect potassium to drop due to NS fluid boluses and insulin gtt over the next couple of hours anyhow 3. Will confirm this with repeat BMP 3. N/V/D - 1. Zofran prn nausea 2. Will test patient for C.Diff first given her history of this, possibly as recently as November 3. Imodium if negative 4. Desitin for rectal pain 4. HTN - 1. continue home norvasc and beta blocker 5. CKD stage 3 - creat above baseline at 2.2 (baseline 1.5) 1. Holding lasix 2. IVF via DKA pathway 3. Serial BMPs 4. Suspect  pre-renal / dehydration due to DKA, vomiting, diarrhea.  DVT prophylaxis: Lovenox Code Status: Full Family Communication: No family in room Disposition Plan: Home after admit Consults called: None Admission status: Admit to inpatient for DKA treatment   ,  M. DO [facility] Pager [phone]  If 7AM-7PM, please contact day team taking care of patient [email or web address] Password [email or web address]  03/08/2017, 1:11 AM      

## 2017-03-09 DIAGNOSIS — N179 Acute kidney failure, unspecified: Secondary | ICD-10-CM

## 2017-03-09 LAB — GLUCOSE, CAPILLARY
GLUCOSE-CAPILLARY: 119 mg/dL — AB (ref 65–99)
GLUCOSE-CAPILLARY: 207 mg/dL — AB (ref 65–99)
Glucose-Capillary: 82 mg/dL (ref 65–99)
Glucose-Capillary: 209 mg/dL — ABNORMAL HIGH (ref 65–99)

## 2017-03-09 LAB — BASIC METABOLIC PANEL
Anion gap: 6 (ref 5–15)
BUN: 39 mg/dL — AB (ref 6–20)
CHLORIDE: 115 mmol/L — AB (ref 101–111)
CO2: 15 mmol/L — ABNORMAL LOW (ref 22–32)
CREATININE: 2.22 mg/dL — AB (ref 0.44–1.00)
Calcium: 7.4 mg/dL — ABNORMAL LOW (ref 8.9–10.3)
GFR calc Af Amer: 25 mL/min — ABNORMAL LOW (ref >60)
GFR calc non Af Amer: 21 mL/min — ABNORMAL LOW (ref >60)
Glucose, Bld: 175 mg/dL — ABNORMAL HIGH (ref 65–99)
Potassium: 4.1 mmol/L (ref 3.5–5.1)
SODIUM: 136 mmol/L (ref 135–145)

## 2017-03-09 LAB — CBC
HCT: 23.1 % — ABNORMAL LOW (ref 36.0–46.0)
Hemoglobin: 8 g/dL — ABNORMAL LOW (ref 12.0–15.0)
MCH: 30 pg (ref 26.0–34.0)
MCHC: 34.6 g/dL (ref 30.0–36.0)
MCV: 86.5 fL (ref 78.0–100.0)
PLATELETS: 275 10*3/uL (ref 150–400)
RBC: 2.67 MIL/uL — ABNORMAL LOW (ref 3.87–5.11)
RDW: 14.2 % (ref 11.5–15.5)
WBC: 13.3 10*3/uL — AB (ref 4.0–10.5)

## 2017-03-09 MED ORDER — PROCHLORPERAZINE EDISYLATE 5 MG/ML IJ SOLN
10.0000 mg | Freq: Once | INTRAMUSCULAR | Status: AC
  Administered 2017-03-09: 10 mg via INTRAVENOUS
  Filled 2017-03-09: qty 2

## 2017-03-09 MED ORDER — INFLUENZA VAC SPLIT QUAD 0.5 ML IM SUSY
0.5000 mL | PREFILLED_SYRINGE | INTRAMUSCULAR | Status: DC
  Filled 2017-03-09: qty 0.5

## 2017-03-09 MED ORDER — PNEUMOCOCCAL VAC POLYVALENT 25 MCG/0.5ML IJ INJ
0.5000 mL | INJECTION | INTRAMUSCULAR | Status: DC
  Filled 2017-03-09: qty 0.5

## 2017-03-09 MED ORDER — SODIUM BICARBONATE 650 MG PO TABS
650.0000 mg | ORAL_TABLET | Freq: Two times a day (BID) | ORAL | Status: DC
  Administered 2017-03-09 – 2017-03-12 (×7): 650 mg via ORAL
  Filled 2017-03-09 (×7): qty 1

## 2017-03-09 MED ORDER — SALINE SPRAY 0.65 % NA SOLN
1.0000 | NASAL | Status: DC | PRN
  Administered 2017-03-09: 1 via NASAL
  Filled 2017-03-09: qty 44

## 2017-03-09 NOTE — Progress Notes (Addendum)
PROGRESS NOTE  Cassandra [name] AVW:[id number] DOB: Oct 15, 1946 DOA: 03/07/2017 PCP: Irving Copas, MD  HPI/Recap of past 81 hours:  71 year old female with past mental history of diabetes mellitus with recurrent admissions for DKA plus stage III chronic kidney disease presented to the emergency room on the night of 3/23 for several days of nausea vomiting and diarrhea and her blood sugar was found to be greater than 700. Patient was admitted to the hospitalist service for DKA, secondary acute kidney injury and hyperkalemia with potassium of 6.3. Following DKA protocol with IV fluids and insulin, sugars continue to trend down and anion gap resolved.  By afternoon of 3/24, DKA resolved, and she actually had episodes of hypoglycemia. Insulin drip discontinued. Patient herself doing okay states that she's feeling better. She denies any pain. She is not sure what happened.  This morning, patient doing okay. CBGs stable. She feels mildly nauseated, but otherwise no complaints. No diarrhea.  Assessment/Plan: Principal Problem:   Diabetic ketoacidosis without coma associated with type 1 diabetes mellitus (HCC): Unclear etiology.? Gastroenteritis. Continue some fluid resuscitation. Off of insulin drip. Have added some by mouth bicarbonate. Started by mouth diet Active Problems: Acute kidney injury with CKD (chronic kidney disease) stage III secondary to DKA.: With aggressive fluid resuscitation, initially improving, then started to climb back up. Have added by mouth bicarbonate.    Hyperkalemia: Secondary to electrolyte shifts from DKA. Potassium now within normal range   Nausea vomiting and diarrhea: Unclear etiology. Gastroenteritis? We'll start to allow patient to eat and monitor closely. Looks to have resolved  Lactic acidosis: Given DKA and hypotension, this is not sepsis, this is volume depletion  Chronic diastolic heart failure: Given volume resuscitation, we'll check BNP.  Stable.  Code Status: Full code   Family Communication: Left message for family   Disposition Plan: Transfer to floor. Potential discharge in next 1-2 days   Consultants:  None   Procedures:  None   Antimicrobials:  None   DVT prophylaxis:  Lovenox   Objective: Vitals:   03/09/17 1000 03/09/17 1025 03/09/17 1100 03/09/17 1147  BP:  125/82    Pulse:      Resp: 18  13 13   Temp:      TempSrc:      SpO2: 97%  97% 95%  Weight:      Height:        Intake/Output Summary (Last 24 hours) at 03/09/17 1233 Last data filed at 03/09/17 1100  Gross per 24 hour  Intake          3031.79 ml  Output                0 ml  Net          3031.79 ml   Filed Weights   03/08/17 0300  Weight: 49.6 kg (109 lb 5.6 oz)    Exam:   General:  Alert and oriented 3, no acute distress   Cardiovascular: Regular rate and rhythm, S1-S2   Respiratory: Clear to auscultation bilaterally   Abdomen: Soft, nontender, nondistended, hypoactive bowel sounds   Musculoskeletal: No clubbing or cyanosis or edema   Skin: No skin breaks, tears or lesions  Psychiatry: Patient is appropriate, no evidence of psychoses    Data Reviewed: CBC:  Recent Labs Lab 03/08/17 0009 03/08/17 0020 03/09/17 0327  WBC 12.4*  --  13.3*  NEUTROABS 11.0*  --   --   HGB 10.0* 10.5* 8.0*  HCT 30.4* 31.0* 23.1*  MCV  91.0  --  86.5  PLT 403*  --  275   Basic Metabolic Panel:  Recent Labs Lab 03/08/17 0020 03/08/17 0313 03/08/17 0727 03/08/17 1038 03/09/17 0327  NA 129* 132* 135 137 136  K 6.3* 4.7 4.0 3.7 4.1  CL 107 109 112* 113* 115*  CO2  --  10* 12* 16* 15*  GLUCOSE >700* 600* 335* 148* 175*  BUN 48* 44* 43* 41* 39*  CREATININE 2.20* 2.33* 2.15* 1.97* 2.22*  CALCIUM  --  7.6* 7.5* 7.6* 7.4*   GFR: Estimated Creatinine Clearance: 18.7 mL/min (A) (by C-G formula based on SCr of 2.22 mg/dL (H)). Liver Function Tests: No results for input(s): AST, ALT, ALKPHOS, BILITOT, PROT, ALBUMIN in the  last 168 hours. No results for input(s): LIPASE, AMYLASE in the last 168 hours. No results for input(s): AMMONIA in the last 168 hours. Coagulation Profile: No results for input(s): INR, PROTIME in the last 168 hours. Cardiac Enzymes: No results for input(s): CKTOTAL, CKMB, CKMBINDEX, TROPONINI in the last 168 hours. BNP (last 3 results) No results for input(s): PROBNP in the last 8760 hours. HbA1C: No results for input(s): HGBA1C in the last 72 hours. CBG:  Recent Labs Lab 03/08/17 1531 03/08/17 1706 03/08/17 1935 03/08/17 2150 03/09/17 0806  GLUCAP 90 89 138* 270* 82   Lipid Profile: No results for input(s): CHOL, HDL, LDLCALC, TRIG, CHOLHDL, LDLDIRECT in the last 72 hours. Thyroid Function Tests: No results for input(s): TSH, T4TOTAL, FREET4, T3FREE, THYROIDAB in the last 72 hours. Anemia Panel: No results for input(s): VITAMINB12, FOLATE, FERRITIN, TIBC, IRON, RETICCTPCT in the last 72 hours. Urine analysis:    Component Value Date/Time   COLORURINE AMBER (A) 03/08/2017 0252   APPEARANCEUR HAZY (A) 03/08/2017 0252   APPEARANCEUR Clear 06/15/2014 1508   LABSPEC 1.017 03/08/2017 0252   LABSPEC 1.010 06/15/2014 1508   PHURINE 5.0 03/08/2017 0252   GLUCOSEU >=500 (A) 03/08/2017 0252   GLUCOSEU Negative 06/15/2014 1508   HGBUR MODERATE (A) 03/08/2017 0252   BILIRUBINUR NEGATIVE 03/08/2017 0252   BILIRUBINUR Negative 06/15/2014 1508   KETONESUR 20 (A) 03/08/2017 0252   PROTEINUR >=300 (A) 03/08/2017 0252   UROBILINOGEN 0.2 09/14/2015 1617   NITRITE NEGATIVE 03/08/2017 0252   LEUKOCYTESUR NEGATIVE 03/08/2017 0252   LEUKOCYTESUR Negative 06/15/2014 1508   Sepsis Labs: @LABRCNTIP (procalcitonin:4,lacticidven:4)  ) Recent Results (from the past 240 hour(s))  MRSA PCR Screening     Status: None   Collection Time: 03/08/17  2:44 AM  Result Value Ref Range Status   MRSA by PCR NEGATIVE NEGATIVE Final    Comment:        The GeneXpert MRSA Assay (FDA approved for NASAL  specimens only), is one component of a comprehensive MRSA colonization surveillance program. It is not intended to diagnose MRSA infection nor to guide or monitor treatment for MRSA infections.       Studies: No results found.  Scheduled Meds: . amLODipine  5 mg Oral Daily  . atorvastatin  10 mg Oral q1800  . carvedilol  3.125 mg Oral BID WC  . enoxaparin (LOVENOX) injection  30 mg Subcutaneous Q24H  . escitalopram  15 mg Oral Daily  . insulin aspart  0-5 Units Subcutaneous QHS  . insulin aspart  0-9 Units Subcutaneous TID WC  . insulin glargine  4 Units Subcutaneous Q2200  . levothyroxine  175 mcg Oral QAC breakfast  . pantoprazole  40 mg Oral Daily  . potassium chloride  20 mEq Oral Daily  .  sodium bicarbonate  650 mg Oral BID    Continuous Infusions: . sodium chloride 100 mL/hr at 03/09/17 1230     LOS: 1 day     Hollice EspyKRISHNAN,Georgianne Gritz K, MD [facility] Pager [phone]  If 7PM-7AM, please contact night-coverage [email or web address] Password [email or web address] 03/09/2017, 12:33 PM

## 2017-03-10 DIAGNOSIS — I5033 Acute on chronic diastolic (congestive) heart failure: Secondary | ICD-10-CM

## 2017-03-10 LAB — CBC
HEMATOCRIT: 22.5 % — AB (ref 36.0–46.0)
HEMOGLOBIN: 7.7 g/dL — AB (ref 12.0–15.0)
MCH: 30.1 pg (ref 26.0–34.0)
MCHC: 34.2 g/dL (ref 30.0–36.0)
MCV: 87.9 fL (ref 78.0–100.0)
Platelets: 277 10*3/uL (ref 150–400)
RBC: 2.56 MIL/uL — ABNORMAL LOW (ref 3.87–5.11)
RDW: 14.3 % (ref 11.5–15.5)
WBC: 7.6 10*3/uL (ref 4.0–10.5)

## 2017-03-10 LAB — BASIC METABOLIC PANEL
ANION GAP: 4 — AB (ref 5–15)
BUN: 33 mg/dL — ABNORMAL HIGH (ref 6–20)
CHLORIDE: 117 mmol/L — AB (ref 101–111)
CO2: 16 mmol/L — ABNORMAL LOW (ref 22–32)
Calcium: 7.3 mg/dL — ABNORMAL LOW (ref 8.9–10.3)
Creatinine, Ser: 1.98 mg/dL — ABNORMAL HIGH (ref 0.44–1.00)
GFR calc Af Amer: 28 mL/min — ABNORMAL LOW (ref >60)
GFR, EST NON AFRICAN AMERICAN: 25 mL/min — AB (ref >60)
GLUCOSE: 145 mg/dL — AB (ref 65–99)
POTASSIUM: 3.9 mmol/L (ref 3.5–5.1)
SODIUM: 137 mmol/L (ref 135–145)

## 2017-03-10 LAB — GLUCOSE, CAPILLARY
GLUCOSE-CAPILLARY: 130 mg/dL — AB (ref 65–99)
GLUCOSE-CAPILLARY: 145 mg/dL — AB (ref 65–99)
GLUCOSE-CAPILLARY: 231 mg/dL — AB (ref 65–99)
Glucose-Capillary: 155 mg/dL — ABNORMAL HIGH (ref 65–99)
Glucose-Capillary: 158 mg/dL — ABNORMAL HIGH (ref 65–99)
Glucose-Capillary: 207 mg/dL — ABNORMAL HIGH (ref 65–99)

## 2017-03-10 LAB — BRAIN NATRIURETIC PEPTIDE: B NATRIURETIC PEPTIDE 5: 620.7 pg/mL — AB (ref 0.0–100.0)

## 2017-03-10 LAB — HEMOGLOBIN AND HEMATOCRIT, BLOOD
HEMATOCRIT: 22 % — AB (ref 36.0–46.0)
Hemoglobin: 7.6 g/dL — ABNORMAL LOW (ref 12.0–15.0)

## 2017-03-10 MED ORDER — FUROSEMIDE 10 MG/ML IJ SOLN
20.0000 mg | Freq: Two times a day (BID) | INTRAMUSCULAR | Status: DC
  Administered 2017-03-10 – 2017-03-11 (×4): 20 mg via INTRAVENOUS
  Filled 2017-03-10 (×5): qty 2

## 2017-03-10 NOTE — Progress Notes (Signed)
Spoke with pt concerning home health needs. Pt states there are no needs at present time.

## 2017-03-10 NOTE — Progress Notes (Signed)
PROGRESS NOTE  Cassandra [name]:[id number] DOB: Nov 17, 1946 DOA: 03/07/2017 PCP: Irving Copas, MD  HPI/Recap of past 36 hours:  71 year old female with past mental history of diabetes mellitus with recurrent admissions for DKA plus stage III chronic kidney disease presented to the emergency room on the night of 3/23 for several days of nausea vomiting and diarrhea and her blood sugar was found to be greater than 700. Patient was admitted to the hospitalist service for DKA, secondary acute kidney injury and hyperkalemia with potassium of 6.3. Following DKA protocol with IV fluids and insulin, sugars continue to trend down and anion gap resolved.  By afternoon of 3/24, DKA resolved, and she actually had episodes of hypoglycemia. Insulin drip discontinued. Patient herself doing okay states that she's feeling better. She denies any pain. She is not sure what happened.  Patient doing better overall. CBG stable and no further diarrhea. Tolerating by mouth. BNP checked within the elevated in the 600s and patient has put on a number of pounds since admission concerning for volume overload. She feels asymptomatic and has no shortness of breath.  Assessment/Plan: Principal Problem:   Diabetic ketoacidosis without coma associated with type 1 diabetes mellitus (HCC): Unclear etiology.? Gastroenteritis. Continue some fluid resuscitation. Off of insulin drip. Have added some by mouth bicarbonate. Started by mouth diet Active Problems: Acute kidney injury with CKD (chronic kidney disease) stage III secondary to DKA.: With aggressive fluid resuscitation, initially improving, then started to climb back up. Have added by mouth bicarbonate.  Anemia of chronic renal disease: Hemoglobin has been trending downward since admission although I think it is more from correction after hemoconcentration and hydration. Today at 7.7. She has been this low even a few months ago. Repeat hemoglobin this afternoon and if  this dropped further, we'll check Hemoccult but at this time, stable.    Hyperkalemia: Secondary to electrolyte shifts from DKA. Potassium now within normal range   Nausea vomiting and diarrhea: Unclear etiology. Gastroenteritis? We'll start to allow patient to eat and monitor closely. Looks to have resolved  Lactic acidosis: Given DKA and hypotension, this is not sepsis, this is volume depletion  Acute on diastolic heart failure: She became volume overloaded following aggressive fluid resuscitation. BNP elevated have stopped IV fluids and started IV Lasix.  Code Status: Full code   Family Communication: Left message for family   Disposition Plan: Transfer to floor. Potential discharge in next 1-2 days   Consultants:  None   Procedures:  None   Antimicrobials:  None   DVT prophylaxis:  Lovenox   Objective: Vitals:   03/09/17 1417 03/09/17 1436 03/09/17 2116 03/10/17 0451  BP:  133/63 130/63 138/67  Pulse:  69 66 67  Resp:  16 16 16   Temp:  98.3 F (36.8 C) 98.5 F (36.9 C) 98.2 F (36.8 C)  TempSrc:  Oral Oral Oral  SpO2:  98% 96% 96%  Weight: 52.6 kg (115 lb 15.4 oz)   55.8 kg (123 lb 0.3 oz)  Height:        Intake/Output Summary (Last 24 hours) at 03/10/17 1215 Last data filed at 03/10/17 0600  Gross per 24 hour  Intake             1900 ml  Output              200 ml  Net             1700 ml   Filed Weights   03/08/17 0300  03/09/17 1417 03/10/17 0451  Weight: 49.6 kg (109 lb 5.6 oz) 52.6 kg (115 lb 15.4 oz) 55.8 kg (123 lb 0.3 oz)    Exam: Unchanged from previous day  General:  Alert and oriented 3, no acute distress   Cardiovascular: Regular rate and rhythm, S1-S2   Respiratory: Clear to auscultation bilaterally   Abdomen: Soft, nontender, nondistended, hypoactive bowel sounds   Musculoskeletal: No clubbing or cyanosis or edema   Skin: No skin breaks, tears or lesions  Psychiatry: Patient is appropriate, no evidence of psychoses     Data Reviewed: CBC:  Recent Labs Lab 03/08/17 0009 03/08/17 0020 03/09/17 0327 03/10/17 0521  WBC 12.4*  --  13.3* 7.6  NEUTROABS 11.0*  --   --   --   HGB 10.0* 10.5* 8.0* 7.7*  HCT 30.4* 31.0* 23.1* 22.5*  MCV 91.0  --  86.5 87.9  PLT 403*  --  275 277   Basic Metabolic Panel:  Recent Labs Lab 03/08/17 0313 03/08/17 0727 03/08/17 1038 03/09/17 0327 03/10/17 0521  NA 132* 135 137 136 137  K 4.7 4.0 3.7 4.1 3.9  CL 109 112* 113* 115* 117*  CO2 10* 12* 16* 15* 16*  GLUCOSE 600* 335* 148* 175* 145*  BUN 44* 43* 41* 39* 33*  CREATININE 2.33* 2.15* 1.97* 2.22* 1.98*  CALCIUM 7.6* 7.5* 7.6* 7.4* 7.3*   GFR: Estimated Creatinine Clearance: 21.2 mL/min (A) (by C-G formula based on SCr of 1.98 mg/dL (H)). Liver Function Tests: No results for input(s): AST, ALT, ALKPHOS, BILITOT, PROT, ALBUMIN in the last 168 hours. No results for input(s): LIPASE, AMYLASE in the last 168 hours. No results for input(s): AMMONIA in the last 168 hours. Coagulation Profile: No results for input(s): INR, PROTIME in the last 168 hours. Cardiac Enzymes: No results for input(s): CKTOTAL, CKMB, CKMBINDEX, TROPONINI in the last 168 hours. BNP (last 3 results) No results for input(s): PROBNP in the last 8760 hours. HbA1C: No results for input(s): HGBA1C in the last 72 hours. CBG:  Recent Labs Lab 03/09/17 1231 03/09/17 1634 03/09/17 2111 03/10/17 0023 03/10/17 0726  GLUCAP 119* 207* 209* 155* 130*   Lipid Profile: No results for input(s): CHOL, HDL, LDLCALC, TRIG, CHOLHDL, LDLDIRECT in the last 72 hours. Thyroid Function Tests: No results for input(s): TSH, T4TOTAL, FREET4, T3FREE, THYROIDAB in the last 72 hours. Anemia Panel: No results for input(s): VITAMINB12, FOLATE, FERRITIN, TIBC, IRON, RETICCTPCT in the last 72 hours. Urine analysis:    Component Value Date/Time   COLORURINE AMBER (A) 03/08/2017 0252   APPEARANCEUR HAZY (A) 03/08/2017 0252   APPEARANCEUR Clear  06/15/2014 1508   LABSPEC 1.017 03/08/2017 0252   LABSPEC 1.010 06/15/2014 1508   PHURINE 5.0 03/08/2017 0252   GLUCOSEU >=500 (A) 03/08/2017 0252   GLUCOSEU Negative 06/15/2014 1508   HGBUR MODERATE (A) 03/08/2017 0252   BILIRUBINUR NEGATIVE 03/08/2017 0252   BILIRUBINUR Negative 06/15/2014 1508   KETONESUR 20 (A) 03/08/2017 0252   PROTEINUR >=300 (A) 03/08/2017 0252   UROBILINOGEN 0.2 09/14/2015 1617   NITRITE NEGATIVE 03/08/2017 0252   LEUKOCYTESUR NEGATIVE 03/08/2017 0252   LEUKOCYTESUR Negative 06/15/2014 1508   Sepsis Labs: @LABRCNTIP (procalcitonin:4,lacticidven:4)  ) Recent Results (from the past 240 hour(s))  MRSA PCR Screening     Status: None   Collection Time: 03/08/17  2:44 AM  Result Value Ref Range Status   MRSA by PCR NEGATIVE NEGATIVE Final    Comment:        The GeneXpert MRSA Assay (FDA  approved for NASAL specimens only), is one component of a comprehensive MRSA colonization surveillance program. It is not intended to diagnose MRSA infection nor to guide or monitor treatment for MRSA infections.       Studies: No results found.  Scheduled Meds: . amLODipine  5 mg Oral Daily  . atorvastatin  10 mg Oral q1800  . carvedilol  3.125 mg Oral BID WC  . enoxaparin (LOVENOX) injection  30 mg Subcutaneous Q24H  . escitalopram  15 mg Oral Daily  . furosemide  20 mg Intravenous Q12H  . Influenza vac split quadrivalent PF  0.5 mL Intramuscular Tomorrow-1000  . insulin aspart  0-5 Units Subcutaneous QHS  . insulin aspart  0-9 Units Subcutaneous TID WC  . insulin glargine  4 Units Subcutaneous Q2200  . levothyroxine  175 mcg Oral QAC breakfast  . pantoprazole  40 mg Oral Daily  . pneumococcal 23 valent vaccine  0.5 mL Intramuscular Tomorrow-1000  . potassium chloride  20 mEq Oral Daily  . sodium bicarbonate  650 mg Oral BID    Continuous Infusions:    LOS: 2 days     Hollice Espy, MD [facility] Pager [phone]  If 7PM-7AM,  please contact night-coverage [email or web address] Password [facility] 03/10/2017, 12:15 PM

## 2017-03-11 LAB — CBC
HCT: 25.9 % — ABNORMAL LOW (ref 36.0–46.0)
Hemoglobin: 8.9 g/dL — ABNORMAL LOW (ref 12.0–15.0)
MCH: 29.9 pg (ref 26.0–34.0)
MCHC: 34.4 g/dL (ref 30.0–36.0)
MCV: 86.9 fL (ref 78.0–100.0)
Platelets: 298 10*3/uL (ref 150–400)
RBC: 2.98 MIL/uL — ABNORMAL LOW (ref 3.87–5.11)
RDW: 14.2 % (ref 11.5–15.5)
WBC: 6.9 10*3/uL (ref 4.0–10.5)

## 2017-03-11 LAB — BASIC METABOLIC PANEL
Anion gap: 5 (ref 5–15)
BUN: 37 mg/dL — ABNORMAL HIGH (ref 6–20)
CO2: 19 mmol/L — ABNORMAL LOW (ref 22–32)
Calcium: 7.7 mg/dL — ABNORMAL LOW (ref 8.9–10.3)
Chloride: 115 mmol/L — ABNORMAL HIGH (ref 101–111)
Creatinine, Ser: 1.87 mg/dL — ABNORMAL HIGH (ref 0.44–1.00)
GFR calc Af Amer: 31 mL/min — ABNORMAL LOW (ref >60)
GFR calc non Af Amer: 26 mL/min — ABNORMAL LOW (ref >60)
Glucose, Bld: 252 mg/dL — ABNORMAL HIGH (ref 65–99)
Potassium: 4 mmol/L (ref 3.5–5.1)
Sodium: 139 mmol/L (ref 135–145)

## 2017-03-11 LAB — GLUCOSE, CAPILLARY
Glucose-Capillary: 233 mg/dL — ABNORMAL HIGH (ref 65–99)
Glucose-Capillary: 266 mg/dL — ABNORMAL HIGH (ref 65–99)
Glucose-Capillary: 221 mg/dL — ABNORMAL HIGH (ref 65–99)
Glucose-Capillary: 129 mg/dL — ABNORMAL HIGH (ref 65–99)

## 2017-03-11 NOTE — Evaluation (Signed)
Physical Therapy Evaluation Patient Details Name: Cassandra [name] MRN: [id number] DOB: 01-01-1946 Today's Date: 03/11/2017   History of Present Illness  71 yo female admitted with diabetic ketoacidosis, acute on chronic HF. Hx of bipolar d/o CHF, CP{D, CKD, CVA, ACDF, back sg.   Clinical Impression  On eval, pt required Min assist for mobility. She walked ~15 feet in room with a walker. Pt presents with general weakness, decreased activity tolerance, and impaired gait and balance. She is at risk for falls with OOB activity. Discussed d/c plan-pt declines SNF placement. Pt stated she will return home with family assisting as needed. Recommend HHPT, Home Health Aide    Follow Up Recommendations Home health PT;Supervision/Assistance - 24 hour (Pt declines SNF placement); Home Health Aide    Equipment Recommendations  None recommended by PT    Recommendations for Other Services OT consult     Precautions / Restrictions Precautions Precautions: Fall Restrictions Weight Bearing Restrictions: No      Mobility  Bed Mobility Overal bed mobility: Needs Assistance Bed Mobility: Sidelying to Sit   Sidelying to sit: Min assist       General bed mobility comments: Assist for trunk. Increased time. Pt relid on bedrail  Transfers Overall transfer level: Needs assistance Equipment used: Rolling walker (2 wheeled) Transfers: Sit to/from [employer] CorporationStand;Stand Pivot Transfers Sit to Stand: Min assist Stand pivot transfers: Min assist       General transfer comment: Assist to rise, stabilize, control descent. VCs safety, hand placement. Stand pivot, bed to bsc.   Ambulation/Gait Ambulation/Gait assistance: Min assist Ambulation Distance (Feet): 15 Feet Assistive device: Rolling walker (2 wheeled) Gait Pattern/deviations: Step-through pattern;Decreased stride length     General Gait Details: Assist to stabilize pt and maneuver safely with walker. VCs safety. Pt is at risk for falls.    Stairs            Wheelchair Mobility    Modified Rankin (Stroke Patients Only)       Balance Overall balance assessment: Needs assistance;History of Falls         Standing balance support: During functional activity Standing balance-Leahy Scale: Poor Standing balance comment: requires walker                             Pertinent Vitals/Pain Pain Assessment: No/denies pain    Home Living Family/patient expects to be discharged to:: Private residence Living Arrangements: Spouse/significant other Available Help at Discharge: Family;Available PRN/intermittently Type of Home: Apartment Home Access: Level entry     Home Layout: One level Home Equipment: Wheelchair - [employer] Corporationmanual;Walker - 2 wheels;Shower seat      Prior Function Level of Independence: Needs assistance      ADL's / Homemaking Assistance Needed: pt reports she was performing bathing/dressing without assistance.         Hand Dominance        Extremity/Trunk Assessment   Upper Extremity Assessment Upper Extremity Assessment: Generalized weakness    Lower Extremity Assessment Lower Extremity Assessment: Generalized weakness    Cervical / Trunk Assessment Cervical / Trunk Assessment: Kyphotic  Communication   Communication: No difficulties  Cognition Arousal/Alertness: Awake/alert Behavior During Therapy: WFL for tasks assessed/performed Overall Cognitive Status: Within Functional Limits for tasks assessed  General Comments      Exercises     Assessment/Plan    PT Assessment Patient needs continued PT services  PT Problem List Decreased strength;Decreased mobility;Decreased activity tolerance;Decreased balance;Decreased knowledge of use of DME       PT Treatment Interventions DME instruction;Therapeutic activities;Gait training;Therapeutic exercise;Patient/family education;Balance training;Stair  training;Functional mobility training    PT Goals (Current goals can be found in the Care Plan section)  Acute Rehab PT Goals Patient Stated Goal: to get better PT Goal Formulation: With patient Time For Goal Achievement: 03/25/17 Potential to Achieve Goals: Good    Frequency Min 3X/week   Barriers to discharge        Co-evaluation               End of Session Equipment Utilized During Treatment: Gait belt Activity Tolerance: Patient tolerated treatment well Patient left: in chair;with call bell/phone within reach Nurse Communication: Mobility status PT Visit Diagnosis: Muscle weakness (generalized) (M62.81);Difficulty in walking, not elsewhere classified (R26.2)    Time: 1610-9604 PT Time Calculation (min) (ACUTE ONLY): 22 min   Charges:   PT Evaluation $PT Eval Low Complexity: 1 Procedure     PT G Codes:          Rebeca Alert, MPT Pager: [phone]

## 2017-03-11 NOTE — Progress Notes (Addendum)
PROGRESS NOTE  Cassandra [name] ZOX:[id number] DOB: 02-Jan-1946 DOA: 03/07/2017 PCP: Irving Copas, MD  HPI/Recap of past 73 hours:  71 year old female with past mental history of diabetes mellitus with recurrent admissions for DKA plus stage III chronic kidney disease presented to the emergency room on the night of 3/23 for several days of nausea vomiting and diarrhea and her blood sugar was found to be greater than 700. Patient was admitted to the hospitalist service for DKA, secondary acute kidney injury and hyperkalemia with potassium of 6.3. Following DKA protocol with IV fluids and insulin, sugars continue to trend down and anion gap resolved.  By afternoon of 3/24, DKA resolved, and she actually had episodes of hypoglycemia. Insulin drip discontinued. Patient herself doing okay states that she's feeling better. She denies any pain. She is not sure what happened.  Patient doing better overall. CBG stable and no further diarrhea. Tolerating by mouth. BNP checked on 3/26 and found to be elevated in the 600s. Patient with history of diastolic heart failure so fluid stopped and started on Lasix. She's had some good response to diuresis. Patient herself doing well with no complaints.  Assessment/Plan: Principal Problem:   Diabetic ketoacidosis without coma associated with type 1 diabetes mellitus (HCC): Unclear etiology.? Gastroenteritis. Continue some fluid resuscitation. Off of insulin drip. Have added some by mouth bicarbonate. Started by mouth diet  Patient has had a number of missions for DKA. I have a suspicion that she likely goes into acute CHF which triggers DKA which leads to automatic diuresis. Since she is dry when she comes in this may be overlooked. Following fluid resuscitation, he becomes volume overloaded again which may not always be apparent. She also tells me that she drinks a lot of canned soup which has a lot of sodium in it which also may be contributing to this. If we  can get her fully diuresed and then have better compliance at home with diet, this cycle may be able to be broken.  Active Problems: Acute kidney injury with CKD (chronic kidney disease) stage III secondary to DKA.: With aggressive fluid resuscitation, initially improving, then started to climb back up. Have added by mouth bicarbonate.  Anemia of chronic renal disease: Hemoglobin has been trending downward since admission although I think it is more from correction after hemoconcentration and hydration. Today at 7.7. She has been this low even a few months ago. Repeat hemoglobin this afternoon and if this dropped further, we'll check Hemoccult but at this time, stable.    Hyperkalemia: Secondary to electrolyte shifts from DKA. Potassium now within normal range   Nausea vomiting and diarrhea: Unclear etiology. Gastroenteritis? We'll start to allow patient to eat and monitor closely. Looks to have resolved  Lactic acidosis: Given DKA and hypotension, this is not sepsis, this is volume depletion  Acute on diastolic heart failure: She became volume overloaded following aggressive fluid resuscitation. BNP elevated have stopped IV fluids and started IV Lasix. She has responded to this. Weight down 2 pounds from previous day currently at 121. Patient's tells me that she weighs somewhere between 110-115  Code Status: Full code   Family Communication: Left message for family yesterday  Disposition Plan: Anticipate discharge Thursday or Friday once fully diuresed   Consultants:  None   Procedures:  None   Antimicrobials:  None   DVT prophylaxis:  Lovenox   Objective: Vitals:   03/10/17 0451 03/10/17 1440 03/10/17 2021 03/11/17 0447  BP: 138/67 (!) 144/65 (!) 143/67 Marland Kitchen)  156/70  Pulse: 67 71 68 70  Resp: 16 16 18 18   Temp: 98.2 F (36.8 C) 98.2 F (36.8 C) 98.1 F (36.7 C) 98.6 F (37 C)  TempSrc: Oral Oral Oral Oral  SpO2: 96% 97% 94% 99%  Weight: 55.8 kg (123 lb 0.3 oz)   54.9  kg (121 lb 0.5 oz)  Height:        Intake/Output Summary (Last 24 hours) at 03/11/17 1448 Last data filed at 03/11/17 1147  Gross per 24 hour  Intake              480 ml  Output              650 ml  Net             -170 ml   Filed Weights   03/09/17 1417 03/10/17 0451 03/11/17 0447  Weight: 52.6 kg (115 lb 15.4 oz) 55.8 kg (123 lb 0.3 oz) 54.9 kg (121 lb 0.5 oz)    Exam:   General:  Alert and oriented 3, no acute distress   Cardiovascular: Regular rate and rhythm, S1-S2   Respiratory: Clear to auscultation bilaterally   Abdomen: Soft, nontender, nondistended, hypoactive bowel sounds   Musculoskeletal: No clubbing or cyanosis, Trace pitting edema  Skin: No skin breaks, tears or lesions  Psychiatry: Patient is appropriate, no evidence of psychoses    Data Reviewed: CBC:  Recent Labs Lab 03/08/17 0009 03/08/17 0020 03/09/17 0327 03/10/17 0521 03/10/17 1547 03/11/17 0805  WBC 12.4*  --  13.3* 7.6  --  6.9  NEUTROABS 11.0*  --   --   --   --   --   HGB 10.0* 10.5* 8.0* 7.7* 7.6* 8.9*  HCT 30.4* 31.0* 23.1* 22.5* 22.0* 25.9*  MCV 91.0  --  86.5 87.9  --  86.9  PLT 403*  --  275 277  --  298   Basic Metabolic Panel:  Recent Labs Lab 03/08/17 0727 03/08/17 1038 03/09/17 0327 03/10/17 0521 03/11/17 0805  NA 135 137 136 137 139  K 4.0 3.7 4.1 3.9 4.0  CL 112* 113* 115* 117* 115*  CO2 12* 16* 15* 16* 19*  GLUCOSE 335* 148* 175* 145* 252*  BUN 43* 41* 39* 33* 37*  CREATININE 2.15* 1.97* 2.22* 1.98* 1.87*  CALCIUM 7.5* 7.6* 7.4* 7.3* 7.7*   GFR: Estimated Creatinine Clearance: 22.5 mL/min (A) (by C-G formula based on SCr of 1.87 mg/dL (H)). Liver Function Tests: No results for input(s): AST, ALT, ALKPHOS, BILITOT, PROT, ALBUMIN in the last 168 hours. No results for input(s): LIPASE, AMYLASE in the last 168 hours. No results for input(s): AMMONIA in the last 168 hours. Coagulation Profile: No results for input(s): INR, PROTIME in the last 168  hours. Cardiac Enzymes: No results for input(s): CKTOTAL, CKMB, CKMBINDEX, TROPONINI in the last 168 hours. BNP (last 3 results) No results for input(s): PROBNP in the last 8760 hours. HbA1C: No results for input(s): HGBA1C in the last 72 hours. CBG:  Recent Labs Lab 03/10/17 1201 03/10/17 1642 03/10/17 2135 03/11/17 0722 03/11/17 1143  GLUCAP 145* 158* 207* 233* 266*   Lipid Profile: No results for input(s): CHOL, HDL, LDLCALC, TRIG, CHOLHDL, LDLDIRECT in the last 72 hours. Thyroid Function Tests: No results for input(s): TSH, T4TOTAL, FREET4, T3FREE, THYROIDAB in the last 72 hours. Anemia Panel: No results for input(s): VITAMINB12, FOLATE, FERRITIN, TIBC, IRON, RETICCTPCT in the last 72 hours. Urine analysis:    Component Value Date/Time  COLORURINE AMBER (A) 03/08/2017 0252   APPEARANCEUR HAZY (A) 03/08/2017 0252   APPEARANCEUR Clear 06/15/2014 1508   LABSPEC 1.017 03/08/2017 0252   LABSPEC 1.010 06/15/2014 1508   PHURINE 5.0 03/08/2017 0252   GLUCOSEU >=500 (A) 03/08/2017 0252   GLUCOSEU Negative 06/15/2014 1508   HGBUR MODERATE (A) 03/08/2017 0252   BILIRUBINUR NEGATIVE 03/08/2017 0252   BILIRUBINUR Negative 06/15/2014 1508   KETONESUR 20 (A) 03/08/2017 0252   PROTEINUR >=300 (A) 03/08/2017 0252   UROBILINOGEN 0.2 09/14/2015 1617   NITRITE NEGATIVE 03/08/2017 0252   LEUKOCYTESUR NEGATIVE 03/08/2017 0252   LEUKOCYTESUR Negative 06/15/2014 1508   Sepsis Labs: @LABRCNTIP (procalcitonin:4,lacticidven:4)  ) Recent Results (from the past 240 hour(s))  MRSA PCR Screening     Status: None   Collection Time: 03/08/17  2:44 AM  Result Value Ref Range Status   MRSA by PCR NEGATIVE NEGATIVE Final    Comment:        The GeneXpert MRSA Assay (FDA approved for NASAL specimens only), is one component of a comprehensive MRSA colonization surveillance program. It is not intended to diagnose MRSA infection nor to guide or monitor treatment for MRSA infections.        Studies: No results found.  Scheduled Meds: . amLODipine  5 mg Oral Daily  . atorvastatin  10 mg Oral q1800  . carvedilol  3.125 mg Oral BID WC  . enoxaparin (LOVENOX) injection  30 mg Subcutaneous Q24H  . escitalopram  15 mg Oral Daily  . furosemide  20 mg Intravenous Q12H  . Influenza vac split quadrivalent PF  0.5 mL Intramuscular Tomorrow-1000  . insulin aspart  0-5 Units Subcutaneous QHS  . insulin aspart  0-9 Units Subcutaneous TID WC  . insulin glargine  4 Units Subcutaneous Q2200  . levothyroxine  175 mcg Oral QAC breakfast  . pantoprazole  40 mg Oral Daily  . pneumococcal 23 valent vaccine  0.5 mL Intramuscular Tomorrow-1000  . potassium chloride  20 mEq Oral Daily  . sodium bicarbonate  650 mg Oral BID    Continuous Infusions:    LOS: 3 days     Hollice Espy, MD [facility] Pager [phone]  If 7PM-7AM, please contact night-coverage [email or web address] Password [facility] 03/11/2017, 2:48 PM

## 2017-03-12 DIAGNOSIS — E1011 Type 1 diabetes mellitus with ketoacidosis with coma: Secondary | ICD-10-CM

## 2017-03-12 LAB — GLUCOSE, CAPILLARY
GLUCOSE-CAPILLARY: 129 mg/dL — AB (ref 65–99)
GLUCOSE-CAPILLARY: 130 mg/dL — AB (ref 65–99)
Glucose-Capillary: 179 mg/dL — ABNORMAL HIGH (ref 65–99)

## 2017-03-12 MED ORDER — INSULIN LISPRO 100 UNIT/ML ~~LOC~~ SOLN: 3.0000 [IU] | Freq: Three times a day (TID) | SUBCUTANEOUS | 11 refills | Status: DC

## 2017-03-12 MED ORDER — INSULIN GLARGINE 100 UNIT/ML SOLOSTAR PEN: 6.0000 [IU] | PEN_INJECTOR | Freq: Every day | SUBCUTANEOUS | 0 refills | Status: DC

## 2017-03-12 MED ORDER — INSULIN GLARGINE 100 UNIT/ML SOLOSTAR PEN: 8.0000 [IU] | PEN_INJECTOR | Freq: Every day | SUBCUTANEOUS | 0 refills | Status: DC

## 2017-03-12 NOTE — Progress Notes (Signed)
Inpatient Diabetes Program Recommendations  AACE/ADA: New Consensus Statement on Inpatient Glycemic Control (2015)  Target Ranges:  Prepandial:   less than 140 mg/dL      Peak postprandial:   less than 180 mg/dL (1-2 hours)      Critically ill patients:  140 - 180 mg/dL   Lab Results  Component Value Date   GLUCAP 179 (H) 03/12/2017   HGBA1C 7.4 (H) 12/23/2016    Review of Glycemic Control  Diabetes history: DM1 Outpatient Diabetes medications: Humalog 4 units ac B, 4 units ac supper Current orders for Inpatient glycemic control: Lantus 4 units QHS, Novolog 0-9 units tidwc and hs.  Pt states her PCP told her to d/c Lantus 4 units QHS since she was having hypoglycemia in early morning. States she takes Humalog 4 units bid (before breakfast and supper) "I check my blood sugars all the time." Forgets to eat sometimes.   Instructed pt to check blood sugars 4x/day and take logbook to MD office visit. Will need very small amount of basal insulin as pt is a Type 1. Consider reducing meal coverage insulin to 3 units tidwc. Very sensitive to insulin. Uses insulin pen. Recommendations:  Consider Lantus 4 units QHS Also consider individual Novolog correction starting at 151 mg/dL. Consider Novolog 151-200 mg/dL-1 unit, [id number] mg/dL- 2 units, [phone] mg/dL-3 units, [phone] mg/dL-4 units, [phone] mg/dL-5 units, >469>401 mg/dL- Give 6 units and call MD.    Discussed with MD.   Thank you. Ailene Ardshonda Olesya Wike, RD, LDN, CDE Inpatient Diabetes Coordinator [phone]

## 2017-03-12 NOTE — Progress Notes (Signed)
Discharge reviewed with husband and patient. Changes in medications reviewed with teach back. Both verbalized understanding of insulin changes. [facility].

## 2017-03-12 NOTE — Care Management Important Message (Signed)
Important Message  Patient Details  Name: Cassandra [name] Cisek MRN: [id number] Date of Birth: [birth year]   Medicare Important Message Given:  Yes    Caren MacadamFuller, Brylinn Teaney 03/12/2017, 10:58 AMImportant Message  Patient Details  Name: Cassandra [name] Maxson MRN: [id number] Date of Birth: [birth year]   Medicare Important Message Given:  Yes    Caren MacadamFuller, Isaish Alemu 03/12/2017, 10:58 AM

## 2017-03-12 NOTE — Discharge Summary (Signed)
Physician Discharge Summary  Cassandra [name]:[id number] DOB: October 12, 1946 DOA: 03/07/2017  PCP: Cammy Copa, MD  Admit date: 03/07/2017 Discharge date: 03/12/2017  Time spent: 25 minutes  Recommendations for Outpatient Follow-up:  1. Changed lantus from 4-->6 U this admit 2. Changed SSI to schedule d3 units reg insulin with meals 3. HH and [place] RN ordered for supervision as OP  Discharge Diagnoses:  Principal Problem:   Diabetic ketoacidosis with coma associated with type 1 diabetes mellitus ([place]) Active Problems:   Benign hypertension with CKD (chronic kidney disease) stage III   Acute on chronic diastolic CHF (congestive heart failure) (HCC)   DKA, type 1 (HCC)   Hyperkalemia   Nausea vomiting and diarrhea   Discharge Condition:  fair  Diet recommendation:  diabetic  Filed Weights   03/10/17 0451 03/11/17 0447 03/12/17 0541  Weight: 55.8 kg (123 lb 0.3 oz) 54.9 kg (121 lb 0.5 oz) 53.7 kg (118 lb 6.2 oz)    History of present illness: per Dr. [place] Pink 71 year old female with past mental history of diabetes mellitus with recurrent admissions for DKA plus stage III chronic kidney disease presented to the emergency room on the night of 3/23 for several days of nausea vomiting and diarrhea and her blood sugar was found to be greater than 700. Patient was admitted to the hospitalist service for DKA, secondary acute kidney injury and hyperkalemia with potassium of 6.3. Following DKA protocol with IV fluids and insulin, sugars continue to trend down and anion gap resolved.  By afternoon of 3/24, DKA resolved, and she actually had episodes of hypoglycemia. Insulin drip discontinued. Patient herself doing okay states that she's feeling better. She denies any pain. She is not sure what happened.  It was somewhat evident after discussion with DM coordinator patient does exhibit non-compliance with therapy In an effort to simplify, I discontinued her sliding scale and made sure  that patient understood that she should take a slightly higher dose of lantus 4-6 units   She should follow up with home health nursing who will supervise her care and compliance with insulin  Discharge Exam: Vitals:   03/12/17 0115 03/12/17 0541  BP: (!) 160/70 (!) 166/73  Pulse: 69 69  Resp: 18 18  Temp: 99.4 F (37.4 C) 99.3 F (37.4 C)    General: eomi ncat Cardiovascular: s1 s 2no m/r/g Respiratory: clear  Discharge Instructions   Discharge Instructions    Diet - low sodium heart healthy    Complete by:  As directed    Increase activity slowly    Complete by:  As directed      Current Discharge Medication List    START taking these medications   Details  insulin lispro (HUMALOG) 100 UNIT/ML injection Inject 0.03 mLs (3 Units total) into the skin 3 (three) times daily with meals. Qty: 10 mL, Refills: 11      CONTINUE these medications which have CHANGED   Details  Insulin Glargine (LANTUS) 100 UNIT/ML Solostar Pen Inject 6 Units into the skin daily at 10 pm. Qty: 15 mL, Refills: 0      CONTINUE these medications which have NOT CHANGED   Details  acetaminophen (TYLENOL) 325 MG tablet Take 650 mg by mouth every 6 (six) hours as needed for mild pain or fever.     amLODipine (NORVASC) 5 MG tablet Take 1 tablet (5 mg total) by mouth daily. Qty: 30 tablet, Refills: 0    aspirin 325 MG EC tablet Take 650 mg by mouth  daily as needed for pain.    atorvastatin (LIPITOR) 10 MG tablet Take 1 tablet (10 mg total) by mouth daily at 6 PM. Qty: 30 tablet, Refills: 0    carvedilol (COREG) 3.125 MG tablet Take 1 tablet (3.125 mg total) by mouth 2 (two) times daily with a meal. Qty: 60 tablet, Refills: 0    escitalopram (LEXAPRO) 10 MG tablet Take 1.5 tablets (15 mg total) by mouth daily. Qty: 90 tablet, Refills: 0    furosemide (LASIX) 20 MG tablet Take 1 tablet (20 mg total) by mouth daily. Qty: 30 tablet    glucose monitoring kit (FREESTYLE) monitoring kit 1 each  by Does not apply route 4 (four) times daily - after meals and at bedtime. 1 month Diabetic Testing Supplies for QAC-QHS accuchecks. Qty: 1 each, Refills: 1    Insulin Pen Needle 32G X 8 MM MISC Use as directed Qty: 100 each, Refills: 0    levothyroxine (SYNTHROID, LEVOTHROID) 175 MCG tablet Take 1 tablet (175 mcg total) by mouth daily before breakfast. Qty: 30 tablet, Refills: 0    pantoprazole (PROTONIX) 40 MG tablet Take 1 tablet (40 mg total) by mouth daily. Qty: 30 tablet, Refills: 0    polyethylene glycol (MIRALAX / GLYCOLAX) packet Take 17 g by mouth daily as needed for mild constipation. Qty: 14 each, Refills: 0    senna-docusate (SENOKOT-S) 8.6-50 MG tablet Take 2 tablets by mouth 2 (two) times daily. Qty: 60 tablet, Refills: 0    traMADol (ULTRAM) 50 MG tablet Take 1 tablet (50 mg total) by mouth every 6 (six) hours as needed. Qty: 15 tablet, Refills: 0    Amino Acids-Protein Hydrolys (FEEDING SUPPLEMENT, PRO-STAT SUGAR FREE 64,) LIQD Take 30 mLs by mouth 2 (two) times daily. Qty: 60 Bottle, Refills: 0      STOP taking these medications     insulin lispro (HUMALOG JUNIOR KWIKPEN) 100 UNIT/ML KiwkPen      potassium chloride 20 MEQ TBCR      cyanocobalamin (,VITAMIN B-12,) 1000 MCG/ML injection        Allergies  Allergen Reactions  . Alprazolam Other (See Comments)    Family preference, for patient to not take med  . Percocet [Oxycodone-Acetaminophen] Other (See Comments)    Family preference, for patient to not take med  . Codeine Diarrhea and Nausea And Vomiting  . Doxycycline Diarrhea and Nausea And Vomiting  . Hydrocodone Nausea And Vomiting  . Omnicef [Cefdinir] Nausea Only and Other (See Comments)    Constipation, tolerated Zosyn  . Tramadol Other (See Comments)    Pt hallucinates when taking medication. Family request not to give medication to pt   . Augmentin [Amoxicillin-Pot Clavulanate] Hives and Rash    Has patient had a PCN reaction causing  immediate rash, facial/tongue/throat swelling, SOB or lightheadedness with hypotension: Yes Has patient had a PCN reaction causing severe rash involving mucus membranes or skin necrosis: Yes Did a PCN reaction that required hospitalization No Did PCN reaction occurring within the last 10 years: Yes If all of the above answers are "NO", then may proceed with Cephalosporin use.  Pt states she has taken penicillin since, and was ok with it...  . Ciprofloxacin Hives    Tolerated LVQ in 12/2016      The results of significant diagnostics from this hospitalization (including imaging, microbiology, ancillary and laboratory) are listed below for reference.    Significant Diagnostic Studies: No results found.  Microbiology: Recent Results (from the past 240 hour(s))  MRSA PCR Screening     Status: None   Collection Time: 03/08/17  2:44 AM  Result Value Ref Range Status   MRSA by PCR NEGATIVE NEGATIVE Final    Comment:        The GeneXpert MRSA Assay (FDA approved for NASAL specimens only), is one component of a comprehensive MRSA colonization surveillance program. It is not intended to diagnose MRSA infection nor to guide or monitor treatment for MRSA infections.      Labs: Basic Metabolic Panel:  Recent Labs Lab 03/08/17 0727 03/08/17 1038 03/09/17 0327 03/10/17 0521 03/11/17 0805  NA 135 137 136 137 139  K 4.0 3.7 4.1 3.9 4.0  CL 112* 113* 115* 117* 115*  CO2 12* 16* 15* 16* 19*  GLUCOSE 335* 148* 175* 145* 252*  BUN 43* 41* 39* 33* 37*  CREATININE 2.15* 1.97* 2.22* 1.98* 1.87*  CALCIUM 7.5* 7.6* 7.4* 7.3* 7.7*   Liver Function Tests: No results for input(s): AST, ALT, ALKPHOS, BILITOT, PROT, ALBUMIN in the last 168 hours. No results for input(s): LIPASE, AMYLASE in the last 168 hours. No results for input(s): AMMONIA in the last 168 hours. CBC:  Recent Labs Lab 03/08/17 0009 03/08/17 0020 03/09/17 0327 03/10/17 0521 03/10/17 1547 03/11/17 0805  WBC 12.4*   --  13.3* 7.6  --  6.9  NEUTROABS 11.0*  --   --   --   --   --   HGB 10.0* 10.5* 8.0* 7.7* 7.6* 8.9*  HCT 30.4* 31.0* 23.1* 22.5* 22.0* 25.9*  MCV 91.0  --  86.5 87.9  --  86.9  PLT 403*  --  275 277  --  298   Cardiac Enzymes: No results for input(s): CKTOTAL, CKMB, CKMBINDEX, TROPONINI in the last 168 hours. BNP: BNP (last 3 results)  Recent Labs  09/28/16 1720 10/16/16 1055 03/10/17 0521  BNP 236.0* 277.7* 620.7*    ProBNP (last 3 results) No results for input(s): PROBNP in the last 8760 hours.  CBG:  Recent Labs Lab 03/11/17 1714 03/11/17 2109 03/12/17 0630 03/12/17 0750 03/12/17 1110  GLUCAP 221* 129* 129* 130* 179*       Signed:  Nita Sells MD   [facility] 03/12/2017, 5:19 PM

## 2017-03-12 NOTE — Progress Notes (Signed)
Spoke with pt concerning Home Health. Pt selected [facility] for HHPT/RN.  Referral given to in house rep.

## 2017-05-09 ENCOUNTER — Inpatient Hospital Stay (HOSPITAL_COMMUNITY)
Admission: EM | Admit: 2017-05-09 | Discharge: 2017-05-14 | DRG: 682 | Disposition: A | Discharge status: Home Health | Payer: Medicare Other | Attending: Family Medicine | Admitting: Family Medicine

## 2017-05-09 ENCOUNTER — Emergency Department (HOSPITAL_COMMUNITY): Payer: Medicare Other

## 2017-05-09 ENCOUNTER — Encounter (HOSPITAL_COMMUNITY): Payer: Self-pay

## 2017-05-09 DIAGNOSIS — Z79899 Other long term (current) drug therapy: Secondary | ICD-10-CM

## 2017-05-09 DIAGNOSIS — E1021 Type 1 diabetes mellitus with diabetic nephropathy: Secondary | ICD-10-CM | POA: Diagnosis present

## 2017-05-09 DIAGNOSIS — N2581 Secondary hyperparathyroidism of renal origin: Secondary | ICD-10-CM | POA: Diagnosis present

## 2017-05-09 DIAGNOSIS — Z90722 Acquired absence of ovaries, bilateral: Secondary | ICD-10-CM

## 2017-05-09 DIAGNOSIS — E1010 Type 1 diabetes mellitus with ketoacidosis without coma: Secondary | ICD-10-CM | POA: Diagnosis present

## 2017-05-09 DIAGNOSIS — J309 Allergic rhinitis, unspecified: Secondary | ICD-10-CM | POA: Diagnosis present

## 2017-05-09 DIAGNOSIS — N183 Chronic kidney disease, stage 3 unspecified: Secondary | ICD-10-CM | POA: Diagnosis present

## 2017-05-09 DIAGNOSIS — J449 Chronic obstructive pulmonary disease, unspecified: Secondary | ICD-10-CM | POA: Diagnosis present

## 2017-05-09 DIAGNOSIS — I130 Hypertensive heart and chronic kidney disease with heart failure and stage 1 through stage 4 chronic kidney disease, or unspecified chronic kidney disease: Secondary | ICD-10-CM | POA: Diagnosis present

## 2017-05-09 DIAGNOSIS — N179 Acute kidney failure, unspecified: Principal | ICD-10-CM | POA: Diagnosis present

## 2017-05-09 DIAGNOSIS — E1022 Type 1 diabetes mellitus with diabetic chronic kidney disease: Secondary | ICD-10-CM | POA: Diagnosis present

## 2017-05-09 DIAGNOSIS — R339 Retention of urine, unspecified: Secondary | ICD-10-CM | POA: Diagnosis present

## 2017-05-09 DIAGNOSIS — E7800 Pure hypercholesterolemia, unspecified: Secondary | ICD-10-CM | POA: Diagnosis present

## 2017-05-09 DIAGNOSIS — D631 Anemia in chronic kidney disease: Secondary | ICD-10-CM | POA: Diagnosis present

## 2017-05-09 DIAGNOSIS — Z90710 Acquired absence of both cervix and uterus: Secondary | ICD-10-CM | POA: Diagnosis not present

## 2017-05-09 DIAGNOSIS — N3010 Interstitial cystitis (chronic) without hematuria: Secondary | ICD-10-CM | POA: Diagnosis present

## 2017-05-09 DIAGNOSIS — E785 Hyperlipidemia, unspecified: Secondary | ICD-10-CM | POA: Diagnosis present

## 2017-05-09 DIAGNOSIS — H409 Unspecified glaucoma: Secondary | ICD-10-CM | POA: Diagnosis present

## 2017-05-09 DIAGNOSIS — Z794 Long term (current) use of insulin: Secondary | ICD-10-CM | POA: Diagnosis not present

## 2017-05-09 DIAGNOSIS — K219 Gastro-esophageal reflux disease without esophagitis: Secondary | ICD-10-CM | POA: Diagnosis present

## 2017-05-09 DIAGNOSIS — E1043 Type 1 diabetes mellitus with diabetic autonomic (poly)neuropathy: Secondary | ICD-10-CM | POA: Diagnosis present

## 2017-05-09 DIAGNOSIS — Z981 Arthrodesis status: Secondary | ICD-10-CM

## 2017-05-09 DIAGNOSIS — R1110 Vomiting, unspecified: Secondary | ICD-10-CM

## 2017-05-09 DIAGNOSIS — I5033 Acute on chronic diastolic (congestive) heart failure: Secondary | ICD-10-CM | POA: Diagnosis present

## 2017-05-09 DIAGNOSIS — R601 Generalized edema: Secondary | ICD-10-CM | POA: Diagnosis present

## 2017-05-09 DIAGNOSIS — E039 Hypothyroidism, unspecified: Secondary | ICD-10-CM | POA: Diagnosis not present

## 2017-05-09 DIAGNOSIS — Z833 Family history of diabetes mellitus: Secondary | ICD-10-CM

## 2017-05-09 DIAGNOSIS — Z961 Presence of intraocular lens: Secondary | ICD-10-CM | POA: Diagnosis present

## 2017-05-09 DIAGNOSIS — Z823 Family history of stroke: Secondary | ICD-10-CM

## 2017-05-09 DIAGNOSIS — N189 Chronic kidney disease, unspecified: Secondary | ICD-10-CM | POA: Diagnosis not present

## 2017-05-09 DIAGNOSIS — Z87891 Personal history of nicotine dependence: Secondary | ICD-10-CM

## 2017-05-09 DIAGNOSIS — R739 Hyperglycemia, unspecified: Secondary | ICD-10-CM

## 2017-05-09 DIAGNOSIS — I5189 Other ill-defined heart diseases: Secondary | ICD-10-CM | POA: Diagnosis present

## 2017-05-09 DIAGNOSIS — Z8249 Family history of ischemic heart disease and other diseases of the circulatory system: Secondary | ICD-10-CM

## 2017-05-09 DIAGNOSIS — F329 Major depressive disorder, single episode, unspecified: Secondary | ICD-10-CM | POA: Diagnosis present

## 2017-05-09 DIAGNOSIS — I360 Nonrheumatic tricuspid (valve) stenosis: Secondary | ICD-10-CM | POA: Diagnosis not present

## 2017-05-09 DIAGNOSIS — Z811 Family history of alcohol abuse and dependence: Secondary | ICD-10-CM | POA: Diagnosis not present

## 2017-05-09 DIAGNOSIS — E1051 Type 1 diabetes mellitus with diabetic peripheral angiopathy without gangrene: Secondary | ICD-10-CM | POA: Diagnosis present

## 2017-05-09 DIAGNOSIS — E538 Deficiency of other specified B group vitamins: Secondary | ICD-10-CM | POA: Diagnosis present

## 2017-05-09 DIAGNOSIS — I10 Essential (primary) hypertension: Secondary | ICD-10-CM | POA: Diagnosis not present

## 2017-05-09 DIAGNOSIS — Z8673 Personal history of transient ischemic attack (TIA), and cerebral infarction without residual deficits: Secondary | ICD-10-CM

## 2017-05-09 DIAGNOSIS — Z9049 Acquired absence of other specified parts of digestive tract: Secondary | ICD-10-CM

## 2017-05-09 DIAGNOSIS — I519 Heart disease, unspecified: Secondary | ICD-10-CM | POA: Diagnosis present

## 2017-05-09 DIAGNOSIS — K3184 Gastroparesis: Secondary | ICD-10-CM | POA: Diagnosis present

## 2017-05-09 DIAGNOSIS — F419 Anxiety disorder, unspecified: Secondary | ICD-10-CM | POA: Diagnosis present

## 2017-05-09 LAB — BASIC METABOLIC PANEL
ANION GAP: 14 (ref 5–15)
ANION GAP: 10 (ref 5–15)
BUN: 47 mg/dL — ABNORMAL HIGH (ref 6–20)
BUN: 46 mg/dL — ABNORMAL HIGH (ref 6–20)
CALCIUM: 7.6 mg/dL — AB (ref 8.9–10.3)
CALCIUM: 7.4 mg/dL — AB (ref 8.9–10.3)
CO2: 16 mmol/L — ABNORMAL LOW (ref 22–32)
CO2: 20 mmol/L — ABNORMAL LOW (ref 22–32)
Chloride: 109 mmol/L (ref 101–111)
Chloride: 112 mmol/L — ABNORMAL HIGH (ref 101–111)
Creatinine, Ser: 3.04 mg/dL — ABNORMAL HIGH (ref 0.44–1.00)
Creatinine, Ser: 2.81 mg/dL — ABNORMAL HIGH (ref 0.44–1.00)
GFR, EST AFRICAN AMERICAN: 17 mL/min — AB (ref >60)
GFR, EST AFRICAN AMERICAN: 19 mL/min — AB (ref >60)
GFR, EST NON AFRICAN AMERICAN: 15 mL/min — AB (ref >60)
GFR, EST NON AFRICAN AMERICAN: 16 mL/min — AB (ref >60)
Glucose, Bld: 612 mg/dL (ref 65–99)
Glucose, Bld: 137 mg/dL — ABNORMAL HIGH (ref 65–99)
POTASSIUM: 3.2 mmol/L — AB (ref 3.5–5.1)
POTASSIUM: 2.8 mmol/L — AB (ref 3.5–5.1)
SODIUM: 142 mmol/L (ref 135–145)
Sodium: 139 mmol/L (ref 135–145)

## 2017-05-09 LAB — COMPREHENSIVE METABOLIC PANEL
ALT: 11 U/L — AB (ref 14–54)
ANION GAP: 14 (ref 5–15)
AST: 16 U/L (ref 15–41)
Albumin: 1.9 g/dL — ABNORMAL LOW (ref 3.5–5.0)
Alkaline Phosphatase: 108 U/L (ref 38–126)
BUN: 44 mg/dL — ABNORMAL HIGH (ref 6–20)
CHLORIDE: 109 mmol/L (ref 101–111)
CO2: 16 mmol/L — AB (ref 22–32)
CREATININE: 2.82 mg/dL — AB (ref 0.44–1.00)
Calcium: 7.7 mg/dL — ABNORMAL LOW (ref 8.9–10.3)
GFR, EST AFRICAN AMERICAN: 19 mL/min — AB (ref >60)
GFR, EST NON AFRICAN AMERICAN: 16 mL/min — AB (ref >60)
Glucose, Bld: 706 mg/dL (ref 65–99)
POTASSIUM: 3.9 mmol/L (ref 3.5–5.1)
SODIUM: 139 mmol/L (ref 135–145)
Total Bilirubin: 0.8 mg/dL (ref 0.3–1.2)
Total Protein: 5.4 g/dL — ABNORMAL LOW (ref 6.5–8.1)

## 2017-05-09 LAB — BASIC METABOLIC PANEL WITH GFR
Anion gap: 11 (ref 5–15)
BUN: 45 mg/dL — ABNORMAL HIGH (ref 6–20)
CO2: 19 mmol/L — ABNORMAL LOW (ref 22–32)
Calcium: 7.5 mg/dL — ABNORMAL LOW (ref 8.9–10.3)
Chloride: 110 mmol/L (ref 101–111)
Creatinine, Ser: 2.94 mg/dL — ABNORMAL HIGH (ref 0.44–1.00)
GFR calc Af Amer: 18 mL/min — ABNORMAL LOW (ref >60)
GFR calc non Af Amer: 15 mL/min — ABNORMAL LOW (ref >60)
Glucose, Bld: 406 mg/dL — ABNORMAL HIGH (ref 65–99)
Potassium: 3 mmol/L — ABNORMAL LOW (ref 3.5–5.1)
Sodium: 140 mmol/L (ref 135–145)

## 2017-05-09 LAB — PROTEIN / CREATININE RATIO, URINE
Creatinine, Urine: 47.04 mg/dL
PROTEIN CREATININE RATIO: 16.16 mg/mg{creat} — AB (ref 0.00–0.15)
Total Protein, Urine: 760 mg/dL

## 2017-05-09 LAB — URINALYSIS, ROUTINE W REFLEX MICROSCOPIC
BILIRUBIN URINE: NEGATIVE
Glucose, UA: >=500 mg/dL — AB
Ketones, ur: 20 mg/dL — AB
Nitrite: NEGATIVE
Protein, ur: >=300 mg/dL — AB
SPECIFIC GRAVITY, URINE: 1.018 (ref 1.005–1.030)
pH: 5 (ref 5.0–8.0)

## 2017-05-09 LAB — CBC WITH DIFFERENTIAL/PLATELET
BASOS PCT: 0 %
Basophils Absolute: 0 10*3/uL (ref 0.0–0.1)
Eosinophils Absolute: 0 10*3/uL (ref 0.0–0.7)
Eosinophils Relative: 0 %
HEMATOCRIT: 27.4 % — AB (ref 36.0–46.0)
Hemoglobin: 8.9 g/dL — ABNORMAL LOW (ref 12.0–15.0)
Lymphocytes Relative: 11 %
Lymphs Abs: 1.1 10*3/uL (ref 0.7–4.0)
MCH: 28.9 pg (ref 26.0–34.0)
MCHC: 32.5 g/dL (ref 30.0–36.0)
MCV: 89 fL (ref 78.0–100.0)
MONO ABS: 0.2 10*3/uL (ref 0.1–1.0)
Monocytes Relative: 2 %
NEUTROS ABS: 8.2 10*3/uL — AB (ref 1.7–7.7)
Neutrophils Relative %: 87 %
PLATELETS: 407 10*3/uL — AB (ref 150–400)
RBC: 3.08 MIL/uL — AB (ref 3.87–5.11)
RDW: 14.7 % (ref 11.5–15.5)
WBC: 9.6 10*3/uL (ref 4.0–10.5)

## 2017-05-09 LAB — MRSA PCR SCREENING: MRSA by PCR: NEGATIVE

## 2017-05-09 LAB — GLUCOSE, CAPILLARY
GLUCOSE-CAPILLARY: 296 mg/dL — AB (ref 65–99)
GLUCOSE-CAPILLARY: 223 mg/dL — AB (ref 65–99)
Glucose-Capillary: 551 mg/dL (ref 65–99)
Glucose-Capillary: 522 mg/dL (ref 65–99)
Glucose-Capillary: 496 mg/dL — ABNORMAL HIGH (ref 65–99)
Glucose-Capillary: 378 mg/dL — ABNORMAL HIGH (ref 65–99)
Glucose-Capillary: 123 mg/dL — ABNORMAL HIGH (ref 65–99)

## 2017-05-09 LAB — CBC
HCT: 25.8 % — ABNORMAL LOW (ref 36.0–46.0)
Hemoglobin: 8.4 g/dL — ABNORMAL LOW (ref 12.0–15.0)
MCH: 29.4 pg (ref 26.0–34.0)
MCHC: 32.6 g/dL (ref 30.0–36.0)
MCV: 90.2 fL (ref 78.0–100.0)
Platelets: 432 K/uL — ABNORMAL HIGH (ref 150–400)
RBC: 2.86 MIL/uL — ABNORMAL LOW (ref 3.87–5.11)
RDW: 14.6 % (ref 11.5–15.5)
WBC: 8.6 K/uL (ref 4.0–10.5)

## 2017-05-09 LAB — VITAMIN B12: Vitamin B-12: 399 pg/mL (ref 180–914)

## 2017-05-09 LAB — TROPONIN I

## 2017-05-09 LAB — CBG MONITORING, ED
GLUCOSE-CAPILLARY: 570 mg/dL — AB (ref 65–99)
Glucose-Capillary: >600 mg/dL (ref 65–99)
Glucose-Capillary: >600 mg/dL (ref 65–99)

## 2017-05-09 LAB — BRAIN NATRIURETIC PEPTIDE: B NATRIURETIC PEPTIDE 5: 578.6 pg/mL — AB (ref 0.0–100.0)

## 2017-05-09 LAB — TSH: TSH: 6.197 u[IU]/mL — AB (ref 0.350–4.500)

## 2017-05-09 MED ORDER — SODIUM CHLORIDE 0.9 % IV SOLN
INTRAVENOUS | Status: DC
  Administered 2017-05-09: 14:00:00 via INTRAVENOUS

## 2017-05-09 MED ORDER — INSULIN ASPART 100 UNIT/ML ~~LOC~~ SOLN
0.0000 [IU] | Freq: Three times a day (TID) | SUBCUTANEOUS | Status: DC
  Administered 2017-05-11 (×2): 5 [IU] via SUBCUTANEOUS
  Administered 2017-05-12: 2 [IU] via SUBCUTANEOUS
  Administered 2017-05-12: 5 [IU] via SUBCUTANEOUS
  Administered 2017-05-12: 6 [IU] via SUBCUTANEOUS
  Administered 2017-05-13 (×2): 9 [IU] via SUBCUTANEOUS
  Administered 2017-05-14: 3 [IU] via SUBCUTANEOUS
  Administered 2017-05-14: 7 [IU] via SUBCUTANEOUS
  Administered 2017-05-14: 5 [IU] via SUBCUTANEOUS

## 2017-05-09 MED ORDER — DEXTROSE-NACL 5-0.45 % IV SOLN: INTRAVENOUS | Status: DC

## 2017-05-09 MED ORDER — ATORVASTATIN CALCIUM 10 MG PO TABS
10.0000 mg | ORAL_TABLET | Freq: Every day | ORAL | Status: DC
  Administered 2017-05-09 – 2017-05-14 (×6): 10 mg via ORAL
  Filled 2017-05-09 (×6): qty 1

## 2017-05-09 MED ORDER — POTASSIUM CHLORIDE 10 MEQ/100ML IV SOLN
10.0000 meq | INTRAVENOUS | Status: AC
  Administered 2017-05-09 (×2): 10 meq via INTRAVENOUS
  Filled 2017-05-09 (×2): qty 100

## 2017-05-09 MED ORDER — INSULIN GLARGINE 100 UNIT/ML ~~LOC~~ SOLN
10.0000 [IU] | Freq: Every day | SUBCUTANEOUS | Status: DC
  Administered 2017-05-09: 10 [IU] via SUBCUTANEOUS
  Filled 2017-05-09: qty 0.1

## 2017-05-09 MED ORDER — ESCITALOPRAM OXALATE 10 MG PO TABS
15.0000 mg | ORAL_TABLET | Freq: Every day | ORAL | Status: DC
  Administered 2017-05-09 – 2017-05-14 (×6): 15 mg via ORAL
  Filled 2017-05-09 (×4): qty 2
  Filled 2017-05-09 (×2): qty 1

## 2017-05-09 MED ORDER — FUROSEMIDE 10 MG/ML IJ SOLN
80.0000 mg | Freq: Three times a day (TID) | INTRAMUSCULAR | Status: DC
  Administered 2017-05-09 – 2017-05-12 (×11): 80 mg via INTRAVENOUS
  Filled 2017-05-09 (×11): qty 8

## 2017-05-09 MED ORDER — LEVOTHYROXINE SODIUM 50 MCG PO TABS
175.0000 ug | ORAL_TABLET | Freq: Every day | ORAL | Status: DC
  Administered 2017-05-10 – 2017-05-14 (×5): 175 ug via ORAL
  Filled 2017-05-09 (×5): qty 1

## 2017-05-09 MED ORDER — HEPARIN SODIUM (PORCINE) 5000 UNIT/ML IJ SOLN
5000.0000 [IU] | Freq: Three times a day (TID) | INTRAMUSCULAR | Status: DC
  Administered 2017-05-09 – 2017-05-14 (×16): 5000 [IU] via SUBCUTANEOUS
  Filled 2017-05-09 (×14): qty 1

## 2017-05-09 MED ORDER — SODIUM CHLORIDE 0.9 % IV SOLN
INTRAVENOUS | Status: DC
  Administered 2017-05-09: 6.5 [IU]/h via INTRAVENOUS
  Administered 2017-05-09: 10.8 [IU]/h via INTRAVENOUS
  Filled 2017-05-09 (×2): qty 1

## 2017-05-09 MED ORDER — CARVEDILOL 3.125 MG PO TABS
3.1250 mg | ORAL_TABLET | Freq: Two times a day (BID) | ORAL | Status: DC
  Administered 2017-05-09 – 2017-05-14 (×11): 3.125 mg via ORAL
  Filled 2017-05-09 (×11): qty 1

## 2017-05-09 MED ORDER — SODIUM CHLORIDE 0.9 % IV SOLN
INTRAVENOUS | Status: DC
  Administered 2017-05-09: 5.4 [IU]/h via INTRAVENOUS
  Filled 2017-05-09: qty 1

## 2017-05-09 NOTE — Progress Notes (Signed)
CRITICAL VALUE ALERT  Critical Value: Glucose 612  Date & Time Notied: 1706 05/09/17  Provider Notified: MD Ghimire  Orders Received/Actions taken: Notified MD Ghimire in regards to K, Anion Gap, CO2 and Creatinine

## 2017-05-09 NOTE — ED Notes (Signed)
Patient transported to CT 

## 2017-05-09 NOTE — ED Notes (Signed)
Patient transported to X-ray 

## 2017-05-09 NOTE — ED Provider Notes (Signed)
[facility] DEPT Provider Note   CSN: [id number] Arrival date & time: 05/09/17  0944     History   Chief Complaint Chief Complaint  Patient presents with  . Hyperglycemia  . Nausea  . Emesis    HPI Jahni A Taormina is a 71 y.o. female.  HPI  71 year old female presents with a chief complaint of edema/swelling. Patient tells me that she's had bilateral leg swelling for about one week. She is chronically on 20 mg Lasix and has not had any recent changes. However over the last 2 or 3 days she's had hand/arm swelling. She felt like her abdomen is swollen. Her husband called EMS because her face was swollen today. Most prominently this is right periorbital swelling. She tells me that her husband states she was acting confused in the middle the night and was "talking out of my head". This morning she felt dizzy and like she had low blood sugar. It was 53 when she checked. She drank orange she is but then vomited. After this EMS was called and they stated they're blood sugars was undetectably high. She has a chronic headache, not sure if it's worsening. She's been having blurry vision over the last 1 week, right greater than left that is new and constant. It feels more blurry than double vision. There is no ocular pain. She had a 1 minute episode of recent chest pain this morning as well but has spontaneous resolved. She's been having cough and shortness of breath for 1 week. Earlier in the week she had dysuria this spontaneously resolved.  Past Medical History:  Diagnosis Date  . Allergic rhinitis   . Anemia   . Anxiety   . Arthritis    "mostly feet, hands" (10/17/2016)  . Benign hypertension with CKD (chronic kidney disease) stage III   . Benign paroxysmal positional vertigo   . Bipolar disorder ([place])   . Broken finger   . Broken shoulder   . Broken toes   . Cervicalgia   . CHF (congestive heart failure) ([place]) 07/2016  . CKD (chronic kidney disease) stage 3, GFR 30-59 ml/min   .  COPD (chronic obstructive pulmonary disease) ([place])   . Depression   . Elevated liver enzymes Hep B/C neg 2014  . Fall at nursing home 10/15/2016  . GERD (gastroesophageal reflux disease)   . Glaucoma   . High cholesterol   . History of alcohol use   . History of blood transfusion 09/2016   "blood got really really low"  . Hypertension   . Hypothyroidism   . Interstitial cystitis    bladder stretched every 9 months  . Migraines    "pretty much qd" (10/17/2016)  . Pneumonia 12/2016  . Psoriasis   . Stroke ([place]) 05/16/2016   Left occipital and thalamic, right hippocampal  . Thyroid disease   . Tobacco use   . 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 Vitamin B12 deficiency     Patient Active Problem List   Diagnosis Date Noted  . Anasarca 05/09/2017  . Nausea vomiting and diarrhea 03/08/2017  . Constipation   . Generalized abdominal pain   . Fecal impaction ([place]) 01/04/2017  . HAP (hospital-acquired pneumonia) 12/23/2016  . HCAP (healthcare-associated pneumonia)   . Diabetes mellitus with complication ([place])   . Hypoglycemia 12/03/2016  . Palliative care encounter   . Goals of care, counseling/discussion   . Cervical vertebral fracture ([place]) 10/17/2016  . Thoracic vertebral fracture ([place]) 10/17/2016  .  Fall at nursing home 10/17/2016  . Common bile duct dilatation 10/17/2016  . Closed fracture of cervical vertebra ([place])   . Recent Gram-negative bacteremia 10/07/2016  . Hyperkalemia   . Gastroesophageal reflux disease   . DKA, type 1 ([place]) 09/06/2016  . Protein-calorie malnutrition, severe 08/28/2016  . AKI (acute kidney injury) ([place]) 07/19/2016  . Acute on chronic diastolic CHF (congestive heart failure) ([facility]) 07/19/2016  . Dehydration   . Cerebral embolism with cerebral infarction 07/13/2016  . Left ventricular diastolic dysfunction, NYHA class 1 07/11/2016  . History of CVA (cerebrovascular accident)   . HLD (hyperlipidemia)   . Anemia of chronic  disease   . Physical deconditioning   . Acute on chronic kidney failure ([place])   . Diabetic ketoacidosis with coma associated with type 1 diabetes mellitus ([place])   . Cerebral thrombosis with cerebral infarction 05/15/2016  . Generalized anxiety disorder 05/10/2016  . Diabetic ketoacidosis without coma associated with type 1 diabetes mellitus ([place]) 05/06/2016  . Insulin dependent type 2 diabetes mellitus, uncontrolled ([place])   . Depression   . CKD (chronic kidney disease) stage 3, GFR 30-59 ml/min   . Uncontrolled Hypothyroidism   . Vitamin B12 deficiency   . Benign paroxysmal positional vertigo   . Anxiety   . Allergic rhinitis   . Glaucoma   . Benign hypertension with CKD (chronic kidney disease) stage III   . Tobacco abuse   . Cervicalgia   . Elevated liver enzymes   . History of alcohol use     Past Surgical History:  Procedure Laterality Date  . ANTERIOR CERVICAL DECOMP/DISCECTOMY FUSION  ~ 2009  . APPENDECTOMY    . BACK SURGERY    . BLADDER SUSPENSION    . CATARACT EXTRACTION W/ INTRAOCULAR LENS  IMPLANT, BILATERAL Bilateral   . DILATION AND CURETTAGE OF UTERUS    . HERNIA REPAIR    . LAPAROSCOPIC CHOLECYSTECTOMY    . TONSILLECTOMY    . TUBAL LIGATION    . VAGINAL HYSTERECTOMY     with oophorectomy    OB History    No data available       Home Medications    Prior to Admission medications   Medication Sig Start Date End Date Taking? Authorizing Provider  aspirin-acetaminophen-caffeine (EXCEDRIN MIGRAINE) [phone] MG tablet Take 1 tablet by mouth every 6 (six) hours as needed for headache.   Yes [provider]  atorvastatin (LIPITOR) 10 MG tablet Take 1 tablet (10 mg total) by mouth daily at 6 PM. Patient taking differently: Take 10 mg by mouth every morning.  12/27/16  Yes Ghimire, Henreitta Leber, MD  carvedilol (COREG) 3.125 MG tablet Take 1 tablet (3.125 mg total) by mouth 2 (two) times daily with a meal. 12/27/16  Yes Ghimire, Henreitta Leber, MD    escitalopram (LEXAPRO) 10 MG tablet Take 1.5 tablets (15 mg total) by mouth daily. Patient taking differently: Take 20 mg by mouth every morning.  12/27/16  Yes Ghimire, Henreitta Leber, MD  furosemide (LASIX) 20 MG tablet Take 20 mg by mouth.   Yes [provider]  ibuprofen (ADVIL,MOTRIN) 200 MG tablet Take 400 mg by mouth every 6 (six) hours as needed.   Yes [provider]  Insulin Glargine (LANTUS) 100 UNIT/ML Solostar Pen Inject 6 Units into the skin daily at 10 pm. Patient taking differently: Inject 5 Units into the skin daily at 10 pm.  03/12/17  Yes Nita Sells, MD  insulin lispro (HUMALOG) 100 UNIT/ML injection Inject 0.03  mLs (3 Units total) into the skin 3 (three) times daily with meals. Patient taking differently: Inject 5 Units into the skin 3 (three) times daily with meals.  03/12/17  Yes Nita Sells, MD  Insulin Pen Needle 32G X 8 MM MISC Use as directed 12/27/16  Yes Ghimire, Henreitta Leber, MD  iron polysaccharides (NIFEREX) 150 MG capsule Take 150 mg by mouth 2 (two) times daily.   Yes [provider]  levothyroxine (SYNTHROID, LEVOTHROID) 175 MCG tablet Take 1 tablet (175 mcg total) by mouth daily before breakfast. 01/07/17  Yes Sudini, Srikar, MD  ondansetron (ZOFRAN) 4 MG tablet Take 4 mg by mouth every 8 (eight) hours as needed for nausea or vomiting.   Yes [provider]  senna-docusate (SENOKOT-S) 8.6-50 MG tablet Take 2 tablets by mouth 2 (two) times daily. 01/07/17  Yes Sudini, Alveta Heimlich, MD  glucose monitoring kit (FREESTYLE) monitoring kit 1 each by Does not apply route 4 (four) times daily - after meals and at bedtime. 1 month Diabetic Testing Supplies for QAC-QHS accuchecks. 12/27/16   Ghimire, Henreitta Leber, MD    Family History Family History  Problem Relation Age of Onset  . Alcohol abuse Mother   . Arthritis Mother   . Asthma Mother   . Cancer Mother        colon cancer  . Hypertension Mother   . Migraines Mother   . Stroke  Mother   . Lung disease Mother   . COPD Mother   . Diabetes Father   . Hypertension Father   . Heart disease Father   . Heart attack Father   . Heart disease Paternal Grandmother   . Diabetes Paternal Grandmother   . Stroke Paternal Grandmother   . Cancer Paternal Grandmother   . Diabetes Paternal Grandfather     Social History Social History  Substance Use Topics  . Smoking status: Former Smoker    Packs/day: 0.25    Years: 10.00    Types: Cigarettes    Quit date: 12/1923  . Smokeless tobacco: Never Used     Comment: QUIT SMOKING 11/2016  . Alcohol use No     Comment: Hasn't had any alcohol "for 2 yrs" (10/17/2016)     Allergies   Alprazolam; Percocet [oxycodone-acetaminophen]; Codeine; Doxycycline; Hydrocodone; Omnicef [cefdinir]; Tramadol; Augmentin [amoxicillin-pot clavulanate]; and Ciprofloxacin   Review of Systems Review of Systems  Constitutional: Negative for fever.  HENT: Positive for facial swelling.   Eyes: Positive for visual disturbance. Negative for pain.  Respiratory: Positive for cough and shortness of breath.   Cardiovascular: Positive for chest pain and leg swelling.  Gastrointestinal: Positive for abdominal distention and vomiting (once this AM). Negative for abdominal pain.  Neurological: Positive for headaches. Negative for weakness and numbness.  All other systems reviewed and are negative.    Physical Exam Updated Vital Signs BP (!) 142/64   Pulse 94   Temp 99 F (37.2 C) (Oral)   Resp (!) 24   Ht '5\' 2"'$  (1.575 m)   Wt 51.7 kg (114 lb)   SpO2 96%   BMI 20.85 kg/m   Physical Exam  Constitutional: She is oriented to person, place, and time. She appears well-developed and well-nourished. No distress.  HENT:  Head: Normocephalic and atraumatic.  Right Ear: External ear normal.  Left Ear: External ear normal.  Nose: Nose normal.  Mild right periorbital swelling without tenderness. Minimal left sided periorbital swelling  Eyes: EOM are  normal. Pupils are equal, round, and reactive  to light. Right eye exhibits no discharge. Left eye exhibits no discharge.  Neck: Neck supple.  Cardiovascular: Normal rate, regular rhythm and normal heart sounds.   Pulses:      Dorsalis pedis pulses are 2+ on the right side, and 2+ on the left side.  Pulmonary/Chest: Effort normal and breath sounds normal. She has no wheezes. She has no rales.  Abdominal: Soft. She exhibits no distension. There is no tenderness.  Musculoskeletal: She exhibits edema (LE pitting edema from feet to above knees).  Neurological: She is alert and oriented to person, place, and time.  Skin: Skin is warm and dry. She is not diaphoretic.  Nursing note and vitals reviewed.    ED Treatments / Results  Labs (all labs ordered are listed, but only abnormal results are displayed) Labs Reviewed  URINALYSIS, ROUTINE W REFLEX MICROSCOPIC - Abnormal; Notable for the following:       Result Value   APPearance CLOUDY (*)    Glucose, UA >=500 (*)    Hgb urine dipstick SMALL (*)    Ketones, ur 20 (*)    Protein, ur >=300 (*)    Leukocytes, UA SMALL (*)    Bacteria, UA MANY (*)    Squamous Epithelial / LPF 0-5 (*)    All other components within normal limits  COMPREHENSIVE METABOLIC PANEL - Abnormal; Notable for the following:    CO2 16 (*)    Glucose, Bld 706 (*)    BUN 44 (*)    Creatinine, Ser 2.82 (*)    Calcium 7.7 (*)    Total Protein 5.4 (*)    Albumin 1.9 (*)    ALT 11 (*)    GFR calc non Af Amer 16 (*)    GFR calc Af Amer 19 (*)    All other components within normal limits  BRAIN NATRIURETIC PEPTIDE - Abnormal; Notable for the following:    B Natriuretic Peptide 578.6 (*)    All other components within normal limits  CBC WITH DIFFERENTIAL/PLATELET - Abnormal; Notable for the following:    RBC 3.08 (*)    Hemoglobin 8.9 (*)    HCT 27.4 (*)    Platelets 407 (*)    Neutro Abs 8.2 (*)    All other components within normal limits  CBG MONITORING, ED -  Abnormal; Notable for the following:    Glucose-Capillary 570 (*)    All other components within normal limits  CBG MONITORING, ED - Abnormal; Notable for the following:    Glucose-Capillary >600 (*)    All other components within normal limits  CBG MONITORING, ED - Abnormal; Notable for the following:    Glucose-Capillary >600 (*)    All other components within normal limits  MRSA PCR SCREENING  TROPONIN I  BASIC METABOLIC PANEL  BASIC METABOLIC PANEL  BASIC METABOLIC PANEL  BASIC METABOLIC PANEL  CBC  PROTEIN / CREATININE RATIO, URINE  TSH  HEMOGLOBIN A1C  VITAMIN B12    EKG  EKG Interpretation  Date/Time:  Friday May 09 2017 10:52:27 EDT Ventricular Rate:  91 PR Interval:    QRS Duration: 66 QT Interval:  378 QTC Calculation: 466 R Axis:   82 Text Interpretation:  Sinus rhythm Borderline right axis deviation Low voltage, extremity and precordial leads no significant change since Jan 2018 Confirmed by Sherwood Gambler [id number]) on 05/09/2017 11:48:05 AM       Radiology Dg Chest 2 View  Result Date: 05/09/2017 CLINICAL DATA:  Edema to lower extremity over  last several days and awoke with new edema to bilat hands and face with eyes almost swollen shut, some cough, no other chest complaints EXAM: CHEST - 2 VIEW COMPARISON:  01/07/2017 and previous FINDINGS: Small bilateral pleural effusions with adjacent atelectasis/ consolidation posteriorly in the lower lobes.Lungs are otherwise clear. Heart size and mediastinal contours are within normal limits. No pneumothorax. Cervical fixation hardware partially visualized. Surgical clips in the upper abdomen. IMPRESSION: Small bilateral pleural effusions, new since prior study Electronically Signed   By: Lucrezia Europe M.D.   On: 05/09/2017 11:22   Ct Head Wo Contrast  Result Date: 05/09/2017 CLINICAL DATA:  Blurred vision, headaches, altered mental status. EXAM: CT HEAD WITHOUT CONTRAST TECHNIQUE: Contiguous axial images were obtained from  the base of the skull through the vertex without intravenous contrast. COMPARISON:  12/11/2016 FINDINGS: Brain: There is atrophy and chronic small vessel disease changes. No acute intracranial abnormality. Specifically, no hemorrhage, hydrocephalus, mass lesion, acute infarction, or significant intracranial injury. Vascular: No hyperdense vessel or unexpected calcification. Skull: No acute calvarial abnormality. Sinuses/Orbits: Visualized paranasal sinuses and mastoids clear. Orbital soft tissues unremarkable. Other: None IMPRESSION: No acute intracranial abnormality. Atrophy, chronic microvascular disease. Electronically Signed   By: Rolm Baptise M.D.   On: 05/09/2017 11:11    Procedures Procedures (including critical care time)  Medications Ordered in ED Medications  levothyroxine (SYNTHROID, LEVOTHROID) tablet 175 mcg (not administered)  atorvastatin (LIPITOR) tablet 10 mg (not administered)  escitalopram (LEXAPRO) tablet 15 mg (not administered)  0.9 %  sodium chloride infusion ( Intravenous Transfusing/Transfer 05/09/17 1441)  dextrose 5 %-0.45 % sodium chloride infusion (not administered)  insulin regular (NOVOLIN R,HUMULIN R) 100 Units in sodium chloride 0.9 % 100 mL (1 Units/mL) infusion (not administered)  heparin injection 5,000 Units (not administered)  potassium chloride 10 mEq in 100 mL IVPB (not administered)  furosemide (LASIX) injection 80 mg (not administered)  carvedilol (COREG) tablet 3.125 mg (not administered)     Initial Impression / Assessment and Plan / ED Course  I have reviewed the triage vital signs and the nursing notes.  Pertinent labs & imaging results that were available during my care of the patient were reviewed by me and considered in my medical decision making (see chart for details).     With patient's degree of swelling and worsening renal failure think this is consistent with nephrotic syndrome. She has greater than 300 protein in her urine. She has  diffuse anasarca. There appears to be an aspect of fluid overload as well. I discussed with Dr. Laurena Bering, will start insulin drip. Her anion gap is normal and the bicarbonate always appears this low, likely a component of chronic kidney disease. However she needs insulin drip and fluids I think would be harmful this point. She will need diuresis. He will admit.  Final Clinical Impressions(s) / ED Diagnoses   Final diagnoses:  Hyperglycemia  Acute renal failure superimposed on chronic kidney disease, unspecified CKD stage, unspecified acute renal failure type [facility])    New Prescriptions Current Discharge Medication List       Sherwood Gambler, MD 05/09/17 [phone]

## 2017-05-09 NOTE — H&P (Signed)
HISTORY AND PHYSICAL       PATIENT DETAILS Name: Cassandra [name] Age: 71 y.o. Sex: female Date of Birth: 04-20-1946 Admit Date: 05/09/2017 FIE:PPIRJJO, Herbie [place], MD   Patient coming from: Home   CHIEF COMPLAINT:  Facial swelling, lower extremity edema, bilateral hand swelling-for at least 1 week  HPI: Cassandra [name] is a 71 y.o. female with medical history significant of  brittle insulin-dependent type 2 diabetes, severe hypothyroidism (recent TSH 136 on 01/06/17), chronic kidney disease stage III, chronic diastolic heart failure who presented to the ED for evaluation of the above-noted complaints. Per patient, for the past 1 week or so, she has noticed worsening swelling in her legs and arms. Per patient, swelling in the legs has slowly worsened, to the extent that she feels that the swelling is now in the thighs. This morning, her husband noted facial swelling, mostly right periorbital puffiness. As a result she sought medical attention, EMS was called and patient was brought to the emergency room.  Per patient, when she woke up this morning, her sugar was in the 50s, she subsequently proceeded to have an orange juice, and a Doctor pepper. She claims she has not taken her insulin today. Per patient, she is very compliant with the medications, but does acknowledge having frequent episodes of hypoglycemia at home-per patient, at times her sugars called down to the 30s range.  There is no history of nausea, vomiting, abdominal pain or diarrhea. She denies any chest pain or shortness of breath.  She does not give a history of polyuria, polydipsia or polyphagia.  ED Course:  In the emergency room, patient was found to have a blood sugar of 706, bicarbonate of 16, creatinine of 2.8 to, anion gap of 14, albumin of 1.9. She was started on IV insulin, I was subsequently asked to admit this patient for further evaluation and treatment  Note: Lives at: Home Mobility:  Cane/Walker Chronic Indwelling Foley:no   REVIEW OF SYSTEMS:  Constitutional:   No  weight loss, night sweats,  Fevers, chills  HEENT:    No headaches, Dysphagia,Tooth/dental problems,Sore throat  Cardio-vascular: No chest pain,  GI:  No heartburn, indigestion, abdominal pain, nausea, vomiting, diarrhea, melena or hematochezia  Resp: No  cough, hemoptysis,plueritic chest pain.   Skin:  No rash or lesions.  GU:  No dysuria, change in color of urine, no urgency or frequency.   Musculoskeletal: No joint pain or swelling.  No decreased range of motion.   Endocrine: No heat intolerance, no cold intolerance,   Psych: No change in mood or affect. No memory loss.   ALLERGIES:   Allergies  Allergen Reactions  . Alprazolam Other (See Comments)    Family preference, for patient to not take med  . Percocet [Oxycodone-Acetaminophen] Other (See Comments)    Family preference, for patient to not take med  . Codeine Diarrhea and Nausea And Vomiting  . Doxycycline Diarrhea and Nausea And Vomiting  . Hydrocodone Nausea And Vomiting  . Omnicef [Cefdinir] Nausea Only and Other (See Comments)    Constipation, tolerated Zosyn  . Tramadol Other (See Comments)    Pt hallucinates when taking medication. Family request not to give medication to pt   . Augmentin [Amoxicillin-Pot Clavulanate] Hives and Rash    Has patient had a PCN reaction causing immediate rash, facial/tongue/throat swelling, SOB or lightheadedness with hypotension: Yes Has patient had a PCN reaction causing severe rash involving mucus membranes or skin necrosis: Yes  Did a PCN reaction that required hospitalization No Did PCN reaction occurring within the last 10 years: Yes If all of the above answers are "NO", then may proceed with Cephalosporin use.  Pt states she has taken penicillin since, and was ok with it...  . Ciprofloxacin Hives    Tolerated LVQ in 12/2016    PAST MEDICAL HISTORY: Past Medical History:   Diagnosis Date  . Allergic rhinitis   . Anemia   . Anxiety   . Arthritis    "mostly feet, hands" (10/17/2016)  . Benign hypertension with CKD (chronic kidney disease) stage III   . Benign paroxysmal positional vertigo   . Bipolar disorder ([place])   . Broken finger   . Broken shoulder   . Broken toes   . Cervicalgia   . CHF (congestive heart failure) ([place]) 07/2016  . CKD (chronic kidney disease) stage 3, GFR 30-59 ml/min   . COPD (chronic obstructive pulmonary disease) ([place])   . Depression   . Elevated liver enzymes Hep B/C neg 2014  . Fall at nursing home 10/15/2016  . GERD (gastroesophageal reflux disease)   . Glaucoma   . High cholesterol   . History of alcohol use   . History of blood transfusion 09/2016   "blood got really really low"  . Hypertension   . Hypothyroidism   . Interstitial cystitis    bladder stretched every 9 months  . Migraines    "pretty much qd" (10/17/2016)  . Pneumonia 12/2016  . Psoriasis   . Stroke ([place]) 05/16/2016   Left occipital and thalamic, right hippocampal  . Thyroid disease   . Tobacco use   . 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 Vitamin B12 deficiency     PAST SURGICAL HISTORY: Past Surgical History:  Procedure Laterality Date  . ANTERIOR CERVICAL DECOMP/DISCECTOMY FUSION  ~ 2009  . APPENDECTOMY    . BACK SURGERY    . BLADDER SUSPENSION    . CATARACT EXTRACTION W/ INTRAOCULAR LENS  IMPLANT, BILATERAL Bilateral   . DILATION AND CURETTAGE OF UTERUS    . HERNIA REPAIR    . LAPAROSCOPIC CHOLECYSTECTOMY    . TONSILLECTOMY    . TUBAL LIGATION    . VAGINAL HYSTERECTOMY     with oophorectomy    MEDICATIONS AT HOME: Prior to Admission medications   Medication Sig Start Date End Date Taking? Authorizing Provider  aspirin-acetaminophen-caffeine (EXCEDRIN MIGRAINE) [phone] MG tablet Take 1 tablet by mouth every 6 (six) hours as needed for headache.   Yes [provider]  atorvastatin (LIPITOR) 10 MG  tablet Take 1 tablet (10 mg total) by mouth daily at 6 PM. Patient taking differently: Take 10 mg by mouth every morning.  12/27/16  Yes Ghimire, Henreitta Leber, MD  carvedilol (COREG) 3.125 MG tablet Take 1 tablet (3.125 mg total) by mouth 2 (two) times daily with a meal. 12/27/16  Yes Ghimire, Henreitta Leber, MD  escitalopram (LEXAPRO) 10 MG tablet Take 1.5 tablets (15 mg total) by mouth daily. Patient taking differently: Take 20 mg by mouth every morning.  12/27/16  Yes Ghimire, Henreitta Leber, MD  furosemide (LASIX) 20 MG tablet Take 20 mg by mouth.   Yes [provider]  ibuprofen (ADVIL,MOTRIN) 200 MG tablet Take 400 mg by mouth every 6 (six) hours as needed.   Yes [provider]  Insulin Glargine (LANTUS) 100 UNIT/ML Solostar Pen Inject 6 Units into the skin daily at 10 pm. Patient taking differently: Inject  5 Units into the skin daily at 10 pm.  03/12/17  Yes Nita Sells, MD  insulin lispro (HUMALOG) 100 UNIT/ML injection Inject 0.03 mLs (3 Units total) into the skin 3 (three) times daily with meals. Patient taking differently: Inject 5 Units into the skin 3 (three) times daily with meals.  03/12/17  Yes Nita Sells, MD  Insulin Pen Needle 32G X 8 MM MISC Use as directed 12/27/16  Yes Ghimire, Henreitta Leber, MD  iron polysaccharides (NIFEREX) 150 MG capsule Take 150 mg by mouth 2 (two) times daily.   Yes [provider]  levothyroxine (SYNTHROID, LEVOTHROID) 175 MCG tablet Take 1 tablet (175 mcg total) by mouth daily before breakfast. 01/07/17  Yes Sudini, Srikar, MD  ondansetron (ZOFRAN) 4 MG tablet Take 4 mg by mouth every 8 (eight) hours as needed for nausea or vomiting.   Yes [provider]  senna-docusate (SENOKOT-S) 8.6-50 MG tablet Take 2 tablets by mouth 2 (two) times daily. 01/07/17  Yes Sudini, Alveta Heimlich, MD  glucose monitoring kit (FREESTYLE) monitoring kit 1 each by Does not apply route 4 (four) times daily - after meals and at bedtime. 1 month Diabetic  Testing Supplies for QAC-QHS accuchecks. 12/27/16   Ghimire, Henreitta Leber, MD    FAMILY HISTORY: Family History  Problem Relation Age of Onset  . Alcohol abuse Mother   . Arthritis Mother   . Asthma Mother   . Cancer Mother        colon cancer  . Hypertension Mother   . Migraines Mother   . Stroke Mother   . Lung disease Mother   . COPD Mother   . Diabetes Father   . Hypertension Father   . Heart disease Father   . Heart attack Father   . Heart disease Paternal Grandmother   . Diabetes Paternal Grandmother   . Stroke Paternal Grandmother   . Cancer Paternal Grandmother   . Diabetes Paternal Grandfather     SOCIAL HISTORY:  reports that she quit smoking about 93 years ago. Her smoking use included Cigarettes. She has a 2.50 pack-year smoking history. She has never used smokeless tobacco. She reports that she does not drink alcohol or use drugs.  PHYSICAL EXAM: Blood pressure (!) 159/69, pulse 98, temperature 98.3 F (36.8 C), temperature source Oral, resp. rate 19, height '5\' 2"'$  (1.575 m), weight 51.7 kg (114 lb), SpO2 100 %.  General appearance :Awake, alert, not in any distress. Eyes:, pupils equally reactive to light and accomodation,no scleral icterus. HEENT: Atraumatic and Normocephalic.She has periorbital puffiness bilaterally and some amount of facial swelling. Neck: supple Resp:Good air entry bilaterally CVS: S1 S2 regular, no murmurs.  GI: Bowel sounds present, Non tender and not distended with no gaurding, rigidity or rebound. Extremities: B/L Lower Ext shows +++ edema up to her mid thighs!!! She also has swelling/pitting edema in her dorsum of the hands bilaterally, some of the swelling also extends into her forearms Neurology:  speech clear,Non focal, sensation is grossly intact. Psychiatric: Normal judgment and insight. Alert and oriented x 3. Normal mood. Musculoskeletal:No digital cyanosis Skin:No Rash, warm and dry Wounds:N/A  LABS ON ADMISSION:  I have  personally reviewed following labs and imaging studies  CBC:  Recent Labs Lab 05/09/17 1042  WBC 9.6  NEUTROABS 8.2*  HGB 8.9*  HCT 27.4*  MCV 89.0  PLT 407*    Basic Metabolic Panel:  Recent Labs Lab 05/09/17 1042  NA 139  K 3.9  CL 109  CO2 16*  GLUCOSE 706*  BUN 44*  CREATININE 2.82*  CALCIUM 7.7*    GFR: Estimated Creatinine Clearance: 14.9 mL/min (A) (by C-G formula based on SCr of 2.82 mg/dL (H)).  Liver Function Tests:  Recent Labs Lab 05/09/17 1042  AST 16  ALT 11*  ALKPHOS 108  BILITOT 0.8  PROT 5.4*  ALBUMIN 1.9*   No results for input(s): LIPASE, AMYLASE in the last 168 hours. No results for input(s): AMMONIA in the last 168 hours.  Coagulation Profile: No results for input(s): INR, PROTIME in the last 168 hours.  Cardiac Enzymes:  Recent Labs Lab 05/09/17 1042  TROPONINI <0.03    BNP (last 3 results) No results for input(s): PROBNP in the last 8760 hours.  HbA1C: No results for input(s): HGBA1C in the last 72 hours.  CBG:  Recent Labs Lab 05/09/17 1042 05/09/17 1300  GLUCAP 570* >600*    Lipid Profile: No results for input(s): CHOL, HDL, LDLCALC, TRIG, CHOLHDL, LDLDIRECT in the last 72 hours.  Thyroid Function Tests: No results for input(s): TSH, T4TOTAL, FREET4, T3FREE, THYROIDAB in the last 72 hours.  Anemia Panel: No results for input(s): VITAMINB12, FOLATE, FERRITIN, TIBC, IRON, RETICCTPCT in the last 72 hours.  Urine analysis:    Component Value Date/Time   COLORURINE AMBER (A) 03/08/2017 0252   APPEARANCEUR HAZY (A) 03/08/2017 0252   APPEARANCEUR Clear 06/15/2014 1508   LABSPEC 1.017 03/08/2017 0252   LABSPEC 1.010 06/15/2014 1508   PHURINE 5.0 03/08/2017 0252   GLUCOSEU >=500 (A) 03/08/2017 0252   GLUCOSEU Negative 06/15/2014 1508   HGBUR MODERATE (A) 03/08/2017 0252   BILIRUBINUR NEGATIVE 03/08/2017 0252   BILIRUBINUR Negative 06/15/2014 1508   KETONESUR 20 (A) 03/08/2017 0252   PROTEINUR >=300 (A)  03/08/2017 0252   UROBILINOGEN 0.2 09/14/2015 1617   NITRITE NEGATIVE 03/08/2017 0252   LEUKOCYTESUR NEGATIVE 03/08/2017 0252   LEUKOCYTESUR Negative 06/15/2014 1508    Sepsis Labs: Lactic Acid, Venous    Component Value Date/Time   LATICACIDVEN 0.5 01/05/2017 0330     Microbiology: No results found for this or any previous visit (from the past 240 hour(s)).    RADIOLOGIC STUDIES ON ADMISSION: Dg Chest 2 View  Result Date: 05/09/2017 CLINICAL DATA:  Edema to lower extremity over last several days and awoke with new edema to bilat hands and face with eyes almost swollen shut, some cough, no other chest complaints EXAM: CHEST - 2 VIEW COMPARISON:  01/07/2017 and previous FINDINGS: Small bilateral pleural effusions with adjacent atelectasis/ consolidation posteriorly in the lower lobes.Lungs are otherwise clear. Heart size and mediastinal contours are within normal limits. No pneumothorax. Cervical fixation hardware partially visualized. Surgical clips in the upper abdomen. IMPRESSION: Small bilateral pleural effusions, new since prior study Electronically Signed   By: Lucrezia Europe M.D.   On: 05/09/2017 11:22   Ct Head Wo Contrast  Result Date: 05/09/2017 CLINICAL DATA:  Blurred vision, headaches, altered mental status. EXAM: CT HEAD WITHOUT CONTRAST TECHNIQUE: Contiguous axial images were obtained from the base of the skull through the vertex without intravenous contrast. COMPARISON:  12/11/2016 FINDINGS: Brain: There is atrophy and chronic small vessel disease changes. No acute intracranial abnormality. Specifically, no hemorrhage, hydrocephalus, mass lesion, acute infarction, or significant intracranial injury. Vascular: No hyperdense vessel or unexpected calcification. Skull: No acute calvarial abnormality. Sinuses/Orbits: Visualized paranasal sinuses and mastoids clear. Orbital soft tissues unremarkable. Other: None IMPRESSION: No acute intracranial abnormality. Atrophy, chronic  microvascular disease. Electronically Signed   By: Rolm Baptise M.D.  On: 05/09/2017 11:11    I have personally reviewed images of chest xray -no pneumonia, appears to have small pleural effusions bilaterally.  EKG:  Personally reviewed. Normal sinus rhythm  ASSESSMENT AND PLAN: Mild DKA: Appears to have mild DKA at best-we will continue with IV insulin-fluid management while on insulin drip will be challenging-given she is significantly volume overloaded with anasarca on exam. Goal is to try and get her off the IV insulin drip as soon as possible, and begin IV diuretics. Admit to stepdown, repeat chemistry panel every 4 hours-once CBGs are improved and no longer with an anion gap, stop insulin and start subcutaneous Lantus/SSI. 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 LFTs are normal, an echocardiogram done last year showed preserved EF which is grade 1 diastolic dysfunction. Since she will be on IV insulin infusion-she will be placed on very the rate of IV fluids-however we will start her on Lasix 80 mg IV 3 times a day. Follow weights, intake and output closely. Recheck echo, check spot urine protein/creatinine ratio.  Acute on chronic renal failure stage III: I suspect acute renal failure is probably hemodynamically mediated in the setting of anasarca and hyperglycemia. Plans are for limited fluid administration while on insulin infusion, and to start aggressive IV diuretics-Lasix 80 mg 3 times a day. Checking urine protein creatinine ratio and renal ultrasound. Hopefully renal function and anasarca will improve, otherwise will need nephrology  consultation at some point   Hypothyroidism: As noted in history of present illness-back in January her TSH was significantly elevated-Synthroid dosing was adjusted-for now continue with current dosing of Synthroid-recheck TSH. Could be playing some role in anasarca.  History of chronic diastolic heart failure: See above-suspect that anasarca is probably secondary to nephrotic syndrome rather than CHF. Checking echocardiogram.  History of vitamin B12 deficiency: Vitamin B12 in January was in the low 200 range with elevated methyl malonic acid-she does not appear to be on vitamin B12 supplementation (not on home medication list)- recheck her vitamin B12 levels and resume supplementation. 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  Further plan will depend as patient's clinical course evolves and further radiologic and laboratory data become available. Patient will be monitored closely.  Above noted plan was discussed with patient face to face at bedside, she was in agreement.   CONSULTS: None  DVT Prophylaxis: Prophylactic Heparin  Code Status: Full Code  Disposition Plan:  Discharge back home vs SNF possibly in 2-3 days  Admission status: Inpatient  going to SDU The medical decision making on this patient was of high complexity and the patient is at high risk for clinical deterioration, therefore this is a level 3 visit.  Total time spent  55 minutes.Greater than 50% of this time was spent in counseling, explanation of diagnosis, planning of further management, and coordination of care.  Oren Binet [facility] Pager [phone]  If 7PM-7AM, please contact night-coverage [email or web address] Password [email or web address] 05/09/2017, 1:24 PM

## 2017-05-09 NOTE — ED Notes (Signed)
RN @ bedside with ultra sound IV

## 2017-05-09 NOTE — ED Notes (Signed)
Date and time results received: 05/09/17 1150  (use smartphrase ".now" to insert current time)  Test: Glucose Critical Value: 706  Name of Provider Notified: Ashley,RN  Orders Received? Or Actions Taken?: Orders Received - See Orders for details Notified EDP

## 2017-05-09 NOTE — ED Notes (Signed)
ED Provider at bedside. 

## 2017-05-09 NOTE — ED Notes (Signed)
INSULIN DRIP INFUSING CBG HIGH (2ND TIME). INCREASED RATE FROM 5.4UNIT/HR TO 10.8 UNIT/HR. PLEASE SEE ADMISSION ORDERS FOR WHEN TO STOP INSULIN INFUSION.

## 2017-05-09 NOTE — Progress Notes (Signed)
Inpatient Diabetes Program Recommendations  AACE/ADA: New Consensus Statement on Inpatient Glycemic Control (2015)  Target Ranges:  Prepandial:   less than 140 mg/dL      Peak postprandial:   less than 180 mg/dL (1-2 hours)      Critically ill patients:  140 - 180 mg/dL   [facility] Sharmel A (MRN [id number]) as of 05/09/2017 13:56  Ref. Range 05/09/2017 10:42  Sodium Latest Ref Range: 135 - 145 mmol/L 139  Potassium Latest Ref Range: 3.5 - 5.1 mmol/L 3.9  Chloride Latest Ref Range: 101 - 111 mmol/L 109  CO2 Latest Ref Range: 22 - 32 mmol/L 16 (L)  Glucose Latest Ref Range: 65 - 99 mg/dL 952706 (HH)  BUN Latest Ref Range: 6 - 20 mg/dL 44 (H)  Creatinine Latest Ref Range: 0.44 - 1.00 mg/dL 8.412.82 (H)  Calcium Latest Ref Range: 8.9 - 10.3 mg/dL 7.7 (L)  Anion gap Latest Ref Range: 5 - 15  14    Admit with: Facial swelling, lower extremity edema, bilateral hand swelling-for at least 1 week/ Mild DKA on admission as well  History: Type 1 DM, CKD, CHF  Home DM Meds: Lantus 5 units QHS       Humalog 5 units TID  Current Insulin Orders: IV Insulin drip      -Patient well known to the Inpatient Diabetes Program.  This is patient's 4th admission since January.  Per Chart Review, [facility]  -Patient with extremely labile CBGs.    -Note IV Insulin drip started at 1:30pm.     --Will follow patient during hospitalization--  Ambrose FinlandJeannine Johnston Murl Golladay RN, MSN, CDE Diabetes Coordinator Inpatient Glycemic Control Team Team Pager: [phone] (8a-5p)

## 2017-05-09 NOTE — ED Notes (Signed)
Bed: [id number] Expected date:  Expected time:  Means of arrival:  Comments: 71 yo hyperglycemia

## 2017-05-09 NOTE — ED Notes (Signed)
Pt states husband states she was talking out of her head. Pt awoke and went to the bathroom and checked her sugar CBG 50. Took OJ however vomited. PCP seen this week however was unable to draw blood. Pt states she saw him for generalized edema. Pt speaking with EDP [place].

## 2017-05-09 NOTE — ED Notes (Signed)
HOSPITALIST PRESENT-VERBAL ORDER TO HOLD INSULIN INFUSION. STAT CBG.

## 2017-05-09 NOTE — ED Triage Notes (Addendum)
Per [facility]- Pt resides at home. Pt c/o of generalized edema x 1 week. Home health RN called and left message about edema. Pt takes 20mg  lasix and elevate feet. No relief. No medications taken due to nausea. BIL numbness and tingling to feet started this am. Pt was ambulatory for EMS. Neuro intact. Denies CP/SOB with clear lung sounds. CBG meter read 50 therefore she did not take am insulin. EMS reading HIGH.

## 2017-05-10 ENCOUNTER — Inpatient Hospital Stay (HOSPITAL_COMMUNITY): Payer: Medicare Other

## 2017-05-10 LAB — GLUCOSE, CAPILLARY
GLUCOSE-CAPILLARY: 28 mg/dL — AB (ref 65–99)
GLUCOSE-CAPILLARY: 99 mg/dL (ref 65–99)
GLUCOSE-CAPILLARY: 94 mg/dL (ref 65–99)
GLUCOSE-CAPILLARY: 84 mg/dL (ref 65–99)
GLUCOSE-CAPILLARY: 82 mg/dL (ref 65–99)
GLUCOSE-CAPILLARY: 83 mg/dL (ref 65–99)
Glucose-Capillary: 30 mg/dL — CL (ref 65–99)

## 2017-05-10 LAB — BASIC METABOLIC PANEL
Anion gap: 9 (ref 5–15)
BUN: 44 mg/dL — AB (ref 6–20)
CO2: 20 mmol/L — ABNORMAL LOW (ref 22–32)
Calcium: 7.7 mg/dL — ABNORMAL LOW (ref 8.9–10.3)
Chloride: 111 mmol/L (ref 101–111)
Creatinine, Ser: 2.77 mg/dL — ABNORMAL HIGH (ref 0.44–1.00)
GFR calc Af Amer: 19 mL/min — ABNORMAL LOW (ref >60)
GFR, EST NON AFRICAN AMERICAN: 16 mL/min — AB (ref >60)
GLUCOSE: 84 mg/dL (ref 65–99)
POTASSIUM: 3.5 mmol/L (ref 3.5–5.1)
Sodium: 140 mmol/L (ref 135–145)

## 2017-05-10 LAB — HEMOGLOBIN A1C
HEMOGLOBIN A1C: 8.7 % — AB (ref 4.8–5.6)
Mean Plasma Glucose: 203 mg/dL

## 2017-05-10 MED ORDER — DEXTROSE 50 % IV SOLN: 1.0000 | Freq: Once | INTRAVENOUS | Status: DC

## 2017-05-10 MED ORDER — ONDANSETRON HCL 4 MG/2ML IJ SOLN
4.0000 mg | Freq: Four times a day (QID) | INTRAMUSCULAR | Status: DC | PRN
  Administered 2017-05-10 – 2017-05-11 (×5): 4 mg via INTRAVENOUS
  Filled 2017-05-10 (×5): qty 2

## 2017-05-10 MED ORDER — INSULIN GLARGINE 100 UNIT/ML ~~LOC~~ SOLN
5.0000 [IU] | Freq: Every day | SUBCUTANEOUS | Status: DC
  Filled 2017-05-10: qty 0.05

## 2017-05-10 MED ORDER — SODIUM BICARBONATE 650 MG PO TABS
650.0000 mg | ORAL_TABLET | Freq: Two times a day (BID) | ORAL | Status: DC
  Administered 2017-05-10 – 2017-05-14 (×9): 650 mg via ORAL
  Filled 2017-05-10 (×9): qty 1

## 2017-05-10 MED ORDER — AMLODIPINE BESYLATE 5 MG PO TABS: 5.0000 mg | ORAL_TABLET | Freq: Every day | ORAL | Status: DC

## 2017-05-10 MED ORDER — DARBEPOETIN ALFA 100 MCG/0.5ML IJ SOSY
100.0000 ug | PREFILLED_SYRINGE | Freq: Once | INTRAMUSCULAR | Status: AC
  Administered 2017-05-10: 100 ug via SUBCUTANEOUS
  Filled 2017-05-10: qty 0.5

## 2017-05-10 MED ORDER — HYDRALAZINE HCL 20 MG/ML IJ SOLN
10.0000 mg | Freq: Four times a day (QID) | INTRAMUSCULAR | Status: DC | PRN
  Administered 2017-05-10 – 2017-05-14 (×3): 10 mg via INTRAVENOUS
  Filled 2017-05-10: qty 1
  Filled 2017-05-10: qty 0.5
  Filled 2017-05-10 (×2): qty 1

## 2017-05-10 MED ORDER — DEXTROSE 50 % IV SOLN
INTRAVENOUS | Status: AC
  Administered 2017-05-10: 25 mL
  Filled 2017-05-10: qty 50

## 2017-05-10 NOTE — Progress Notes (Signed)
Pt became weak and diaphoretic, blood sugar checked at 0230 and was 28. Orange juice give-re-checked sugar 15 mins later and CBG was 30. Patient unable to drink anything else due to nausea, D50 given. Re-checked sugar 15 mins later and CBG was 99. MD made aware.   Patient also having difficulty voiding in this same time frame. Placed on bed pan several times without any success in voiding. Patient was bladder scanned which showed 423 mls of urine. MD notified and indwelling foley catheter was placed.

## 2017-05-10 NOTE — Consult Note (Signed)
Referring Provider: No ref. provider found Primary Care Physician:  Aura Dials, MD Primary Nephrologist:     Reason for Consultation:   Chronic renal disease  Stage 3/4   Anemia  Secondary HPT and anasarca with significant volume overload   HPI:  Cassandra A McFaydenis a 71 y.o.femalewith medical history significant of brittle insulin-dependent type 2 diabetes, severe hypothyroidism (recent TSH 136 on 01/06/17), chronic kidney disease stage III, chronic diastolic heart failure who presented to the ED for evaluation of the above-noted complaints. Per patient, for the past 1 week or so, she has noticed worsening swelling in her legs and arms. Per patient, swelling in the legs has slowly worsened, to the extent that she feels that the swelling is now in the thighs. This morning, her husband noted facial swelling, mostly right periorbital puffiness. As a result she sought medical attention, EMS was called and patient was brought to the emergency room. She reports hypoglycemia at home. She presents with worsening swelling LE, arms. She was found to be in DKA. Baseline serum creatinine appears in the 1.8 - 2.2 range  She was seen by Dr Posey Pronto last year and at that time had acute on chronic renal disease. She denies the use of NSAID medication. No Family history of renal disease.  Past Medical History:  Diagnosis Date  . Allergic rhinitis   . Anemia   . Anxiety   . Arthritis    "mostly feet, hands" (10/17/2016)  . Benign hypertension with CKD (chronic kidney disease) stage III   . Benign paroxysmal positional vertigo   . Bipolar disorder ([place])   . Broken finger   . Broken shoulder   . Broken toes   . Cervicalgia   . CHF (congestive heart failure) ([place]) 07/2016  . CKD (chronic kidney disease) stage 3, GFR 30-59 ml/min   . COPD (chronic obstructive pulmonary disease) ([place])   . Depression   . Elevated liver enzymes Hep B/C neg 2014  . Fall at nursing home 10/15/2016  . GERD (gastroesophageal  reflux disease)   . Glaucoma   . High cholesterol   . History of alcohol use   . History of blood transfusion 09/2016   "blood got really really low"  . Hypertension   . Hypothyroidism   . Interstitial cystitis    bladder stretched every 9 months  . Migraines    "pretty much qd" (10/17/2016)  . Pneumonia 12/2016  . Psoriasis   . Stroke ([place]) 05/16/2016   Left occipital and thalamic, right hippocampal  . Thyroid disease   . Tobacco use   . 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 Vitamin B12 deficiency     Past Surgical History:  Procedure Laterality Date  . ANTERIOR CERVICAL DECOMP/DISCECTOMY FUSION  ~ 2009  . APPENDECTOMY    . BACK SURGERY    . BLADDER SUSPENSION    . CATARACT EXTRACTION W/ INTRAOCULAR LENS  IMPLANT, BILATERAL Bilateral   . DILATION AND CURETTAGE OF UTERUS    . HERNIA REPAIR    . LAPAROSCOPIC CHOLECYSTECTOMY    . TONSILLECTOMY    . TUBAL LIGATION    . VAGINAL HYSTERECTOMY     with oophorectomy    Prior to Admission medications   Medication Sig Start Date End Date Taking? Authorizing Provider  aspirin-acetaminophen-caffeine (EXCEDRIN MIGRAINE) [id number] MG tablet Take 1 tablet by mouth every 6 (six) hours as needed for headache.   Yes [provider]  atorvastatin (LIPITOR) 10 MG tablet Take  1 tablet (10 mg total) by mouth daily at 6 PM. Patient taking differently: Take 10 mg by mouth every morning.  12/27/16  Yes Ghimire, Henreitta Leber, MD  carvedilol (COREG) 3.125 MG tablet Take 1 tablet (3.125 mg total) by mouth 2 (two) times daily with a meal. 12/27/16  Yes Ghimire, Henreitta Leber, MD  escitalopram (LEXAPRO) 10 MG tablet Take 1.5 tablets (15 mg total) by mouth daily. Patient taking differently: Take 20 mg by mouth every morning.  12/27/16  Yes Ghimire, Henreitta Leber, MD  furosemide (LASIX) 20 MG tablet Take 20 mg by mouth.   Yes [provider]  ibuprofen (ADVIL,MOTRIN) 200 MG tablet Take 400 mg by mouth every 6 (six) hours as  needed.   Yes [provider]  Insulin Glargine (LANTUS) 100 UNIT/ML Solostar Pen Inject 6 Units into the skin daily at 10 pm. Patient taking differently: Inject 5 Units into the skin daily at 10 pm.  03/12/17  Yes Nita Sells, MD  insulin lispro (HUMALOG) 100 UNIT/ML injection Inject 0.03 mLs (3 Units total) into the skin 3 (three) times daily with meals. Patient taking differently: Inject 5 Units into the skin 3 (three) times daily with meals.  03/12/17  Yes Nita Sells, MD  Insulin Pen Needle 32G X 8 MM MISC Use as directed 12/27/16  Yes Ghimire, Henreitta Leber, MD  iron polysaccharides (NIFEREX) 150 MG capsule Take 150 mg by mouth 2 (two) times daily.   Yes [provider]  levothyroxine (SYNTHROID, LEVOTHROID) 175 MCG tablet Take 1 tablet (175 mcg total) by mouth daily before breakfast. 01/07/17  Yes Sudini, Srikar, MD  ondansetron (ZOFRAN) 4 MG tablet Take 4 mg by mouth every 8 (eight) hours as needed for nausea or vomiting.   Yes [provider]  senna-docusate (SENOKOT-S) 8.6-50 MG tablet Take 2 tablets by mouth 2 (two) times daily. 01/07/17  Yes Sudini, Alveta Heimlich, MD  glucose monitoring kit (FREESTYLE) monitoring kit 1 each by Does not apply route 4 (four) times daily - after meals and at bedtime. 1 month Diabetic Testing Supplies for QAC-QHS accuchecks. 12/27/16   Ghimire, Henreitta Leber, MD    Current Facility-Administered Medications  Medication Dose Route Frequency Provider Last Rate Last Dose  . amLODipine (NORVASC) tablet 5 mg  5 mg Oral Daily Regalado, Belkys A, MD      . atorvastatin (LIPITOR) tablet 10 mg  10 mg Oral q1800 Jonetta Osgood, MD   10 mg at 05/09/17 1802  . carvedilol (COREG) tablet 3.125 mg  3.125 mg Oral BID WC Jonetta Osgood, MD   3.125 mg at 05/10/17 0908  . dextrose 50 % solution 50 mL  1 ampule Intravenous Once Jonetta Osgood, MD      . escitalopram (LEXAPRO) tablet 15 mg  15 mg Oral Daily Jonetta Osgood, MD   15 mg at  05/10/17 0908  . furosemide (LASIX) injection 80 mg  80 mg Intravenous TID Jonetta Osgood, MD   80 mg at 05/10/17 0907  . heparin injection 5,000 Units  5,000 Units Subcutaneous Q8H Jonetta Osgood, MD   5,000 Units at 05/10/17 [phone]  . hydrALAZINE (APRESOLINE) injection 10 mg  10 mg Intravenous Q6H PRN Regalado, Belkys A, MD   10 mg at 05/10/17 1317  . insulin aspart (novoLOG) injection 0-9 Units  0-9 Units Subcutaneous TID WC Lynch, Mary A, NP      . insulin glargine (LANTUS) injection 5 Units  5 Units Subcutaneous QHS Regalado, Belkys A,  MD      . levothyroxine (SYNTHROID, LEVOTHROID) tablet 175 mcg  175 mcg Oral QAC breakfast Jonetta Osgood, MD   175 mcg at 05/10/17 7673  . ondansetron (ZOFRAN) injection 4 mg  4 mg Intravenous Q6H PRN Ritta Slot, NP   4 mg at 05/10/17 1305  . sodium bicarbonate tablet 650 mg  650 mg Oral BID Regalado, Belkys A, MD   650 mg at 05/10/17 0908    Allergies as of 05/09/2017 - Review Complete 05/09/2017  Allergen Reaction Noted  . Alprazolam Other (See Comments) 10/16/2016  . Percocet [oxycodone-acetaminophen] Other (See Comments) 10/16/2016  . Codeine Diarrhea and Nausea And Vomiting 09/20/2015  . Doxycycline Diarrhea and Nausea And Vomiting 09/20/2015  . Hydrocodone Nausea And Vomiting 03/03/2015  . Omnicef [cefdinir] Nausea Only and Other (See Comments) 09/20/2015  . Tramadol Other (See Comments) 01/04/2017  . Augmentin [amoxicillin-pot clavulanate] Hives and Rash 08/26/2016  . Ciprofloxacin Hives 01/17/2016    Family History  Problem Relation Age of Onset  . Alcohol abuse Mother   . Arthritis Mother   . Asthma Mother   . Cancer Mother        colon cancer  . Hypertension Mother   . Migraines Mother   . Stroke Mother   . Lung disease Mother   . COPD Mother   . Diabetes Father   . Hypertension Father   . Heart disease Father   . Heart attack Father   . Heart disease Paternal Grandmother   . Diabetes Paternal Grandmother   .  Stroke Paternal Grandmother   . Cancer Paternal Grandmother   . Diabetes Paternal Grandfather     Social History   Social History  . Marital status: Married    Spouse name: N/A  . Number of children: N/A  . Years of education: N/A   Occupational History  . Not on file.   Social History Main Topics  . Smoking status: Former Smoker    Packs/day: 0.25    Years: 10.00    Types: Cigarettes    Quit date: 12/1923  . Smokeless tobacco: Never Used     Comment: QUIT SMOKING 11/2016  . Alcohol use No     Comment: Hasn't had any alcohol "for 2 yrs" (10/17/2016)  . Drug use: No  . Sexual activity: Yes   Other Topics Concern  . Not on file   Social History Narrative   Patient has been married for 13 years and lives with spouse, has 2 children, 3 grandchildren, and 1 great grandchild. She does not use walker/cane, does not clean house, does not shop, does not do yard work, and does not drive. She is retired. Previous occupation was Product manager. Completed [employer].         Epwoth Sleepiness Scale Score:  7      --I have high blood pressure   --I have had insomnia   --I seem to be losing my sex drive   --I feel stressed and lack motivation   --I wake up to urinate frequently at night   --I wake up with a dry mouth or sore throat   --I feel excessively sleepy and tired throughout the day   --I have Diabetes   --I have been told that I snore   --I have problems with memory/concentration   --I frequently awake with headaches        Review of Systems: Gen:  ,+  anorexia, fatigue, weakness, malaise,  HEENT: normal external appearance No Epistaxis or Sore throat. No sinusitis.   CV: Denies chest pain, angina, palpitations, syncope, + orthopnea,+  PND, + peripheral edema,  Resp: +dyspnea with exercise,no  cough, sputum, wheezing, coughing up blood, and pleurisy. GI: Denies vomiting blood, jaundice, and fecal incontinence.   Denies dysphagia or odynophagia. GU :  Denies urinary burning, blood in urine, urinary frequency, urinary hesitancy, nocturnal urination, and urinary incontinence.  No renal calculi. MS: Denies joint pain, limitation of movement, and swelling, stiffness, low back pain, extremity pain. Denies muscle weakness, cramps, atrophy.  No use of non steroidal antiinflammatory drugs. Derm: Denies rash, itching, dry skin, hives, moles, warts, or unhealing ulcers.  Psych: Denies depression, anxiety, memory loss, suicidal ideation, hallucinations, paranoia, and confusion. Heme: Denies bruising, bleeding, and enlarged lymph nodes. Neuro: No headache.  No diplopia. No dysarthria.  No dysphasia.  No history of CVA.  No Seizures. No paresthesias.  No weakness. Endocrine DM. Type 1   Hypothyroid  No Adrenal disease.  Physical Exam: Vital signs in last 24 hours: Temp:  [97.6 F (36.4 C)-99.4 F (37.4 C)] 99.4 F (37.4 C) (05/26 1340) Pulse Rate:  [80] 80 (05/26 1340) Resp:  [0-21] 14 (05/26 1340) BP: (124-184)/(43-110) 124/43 (05/26 1340) SpO2:  [93 %-100 %] 100 % (05/26 1340) Weight:  [114 lb 10.2 oz (52 kg)] 114 lb 10.2 oz (52 kg) (05/26 0500) Last BM Date: 05/10/17 General:   Elderly appearing lady  Head:  Normocephalic and atraumatic. Eyes:  Sclera clear, no icterus.   Conjunctiva pink. Ears:  Normal auditory acuity. Nose:  No deformity, discharge,  or lesions. Mouth:  No deformity or lesions, dentition normal. Neck:  Supple; no masses or thyromegaly. JVP not elevated Lungs:  Clear throughout to auscultation.   No wheezes, crackles, or rhonchi. No acute distress. Heart:  Regular rate and rhythm; no murmurs, clicks, rubs,  or gallops. Abdomen:  Soft, nontender and nondistended. No masses, hepatosplenomegaly or hernias noted. Normal bowel sounds, without guarding, and without rebound.   Msk:  Symmetrical without gross deformities. Normal posture. Pulses:  No carotid, renal, femoral bruits. DP and PT symmetrical and equal Extremities:   Without clubbing or   3 +  edema. Neurologic:  Alert and  oriented x4;  grossly normal neurologically. Skin:  Intact without significant lesions or rashes. Cervical Nodes:  No significant cervical adenopathy. Psych:  Alert and cooperative. Normal mood and affect.  Intake/Output from previous day: 05/25 0701 - 05/26 0700 In: 755.5 [I.V.:555.5; IV Piggyback:200] Out: 625 [Urine:550; Emesis/NG output:75] Intake/Output this shift: Total I/O In: -  Out: 700 [Urine:700]  Lab Results:  Recent Labs  05/09/17 1042 05/09/17 1515  WBC 9.6 8.6  HGB 8.9* 8.4*  HCT 27.4* 25.8*  PLT 407* 432*   BMET  Recent Labs  05/09/17 1903 05/09/17 2241 05/10/17 0413  NA 140 142 140  K 3.0* 2.8* 3.5  CL 110 112* 111  CO2 19* 20* 20*  GLUCOSE 406* 137* 84  BUN 45* 46* 44*  CREATININE 2.94* 2.81* 2.77*  CALCIUM 7.5* 7.4* 7.7*   LFT  Recent Labs  05/09/17 1042  PROT 5.4*  ALBUMIN 1.9*  AST 16  ALT 11*  ALKPHOS 108  BILITOT 0.8   PT/INR No results for input(s): LABPROT, INR in the last 72 hours. Hepatitis Panel No results for input(s): HEPBSAG, HCVAB, HEPAIGM, HEPBIGM in the last 72 hours.  Studies/Results: Dg Chest 2 View  Result Date: 05/09/2017 CLINICAL DATA:  Edema to lower extremity  over last several days and awoke with new edema to bilat hands and face with eyes almost swollen shut, some cough, no other chest complaints EXAM: CHEST - 2 VIEW COMPARISON:  01/07/2017 and previous FINDINGS: Small bilateral pleural effusions with adjacent atelectasis/ consolidation posteriorly in the lower lobes.Lungs are otherwise clear. Heart size and mediastinal contours are within normal limits. No pneumothorax. Cervical fixation hardware partially visualized. Surgical clips in the upper abdomen. IMPRESSION: Small bilateral pleural effusions, new since prior study Electronically Signed   By: Lucrezia Europe M.D.   On: 05/09/2017 11:22   Ct Head Wo Contrast  Result Date: 05/09/2017 CLINICAL DATA:   Blurred vision, headaches, altered mental status. EXAM: CT HEAD WITHOUT CONTRAST TECHNIQUE: Contiguous axial images were obtained from the base of the skull through the vertex without intravenous contrast. COMPARISON:  12/11/2016 FINDINGS: Brain: There is atrophy and chronic small vessel disease changes. No acute intracranial abnormality. Specifically, no hemorrhage, hydrocephalus, mass lesion, acute infarction, or significant intracranial injury. Vascular: No hyperdense vessel or unexpected calcification. Skull: No acute calvarial abnormality. Sinuses/Orbits: Visualized paranasal sinuses and mastoids clear. Orbital soft tissues unremarkable. Other: None IMPRESSION: No acute intracranial abnormality. Atrophy, chronic microvascular disease. Electronically Signed   By: Rolm Baptise M.D.   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 I think a protein/ creatinine ratio would be helpful. The acute on chronic changes are most likely hemodynamically mediated. There is no use of ACE or ARB. He has some hemodynamic instability although is diuresing about 1-2 L with IV lasix   HTN/ volume appears to be massively fluid overloaded and would benefit from diuresis  Anemia check iron stores and administer aranesp    LOS: 1 Cassandra [name] W '@TODAY''@3'$ :10 PM

## 2017-05-10 NOTE — Progress Notes (Signed)
PROGRESS NOTE    Cassandra AbbeBillie A Safer  ZOX:[id number] DOB: [birth year] DOA: 05/09/2017 PCP: Henrine Screwshacker, Robert, MD    Brief Narrative: Cassandra [name] is a 71 y.o. female with medical history significant of  brittle insulin-dependent type 2 diabetes, severe hypothyroidism (recent TSH 136 on 01/06/17), chronic kidney disease stage III, chronic diastolic heart failure who presented to the ED for evaluation of the above-noted complaints. Per patient, for the past 1 week or so, she has noticed worsening swelling in her legs and arms. Per patient, swelling in the legs has slowly worsened, to the extent that she feels that the swelling is now in the thighs. This morning, her husband noted facial swelling, mostly right periorbital puffiness. As a result she sought medical attention, EMS was called and patient was brought to the emergency room. She reports hypoglycemia at home. She presents with worsening swelling LE, arms. She was found to be in DKA.    Assessment & Plan:   Principal Problem:   Anasarca Active Problems:   CKD (chronic kidney disease) stage 3, GFR 30-59 ml/min   Uncontrolled Hypothyroidism   HLD (hyperlipidemia)   Left ventricular diastolic dysfunction, NYHA class 1   AKI (acute kidney injury) ([facility])   DKA, type 1 ([facility])  1-DKA,  DM; resolved. Gap normalized bicarb increased.  She was transition from insulin Gtt to lantus. She was started on 10 units of lantus. Her Blood sugar drop to 28.  -Will decrease lantus to 4 units.  -SSI.   2-Acute on chronic renal failure, anasarca; last cr per records at 1.8---2.2 Cr on admission at 3.  Continue with IV lasix.  Poor urine out put,. Will consult nephrology.  Develops urine retention overnight. Has foley catheter in place.   3-Hypothyroidism; continue with synthroid, dose recently increased.  TSH ;6.19  4-Acute on Chronic Diastolic HF;  on IV lasix.  Chest x ray with small bilateral effusion.   5-History of vitamin B12 deficiency: B  12 339. Start oral supplements.    DVT prophylaxis: Heparin  Code Status: full code.  Family Communication: care discussed with patient.  Disposition Plan:  Remain in the step down unit. Home at time of discharge.    Consultants:   nephology    Procedures:   none  Antimicrobials: none  Subjective: She is feeling cold. Denies abdominal pain.  Denies chest pain. She was sitting in stool in her bed.   Objective: Vitals:   05/10/17 0424 05/10/17 0500 05/10/17 0600 05/10/17 0700  BP:  (!) 143/65 (!) 154/64 (!) 168/82  Pulse:      Resp:  12 11 14   Temp: 98.2 F (36.8 C)     TempSrc: Oral     SpO2:  98% 100% 98%  Weight:  52 kg (114 lb 10.2 oz)    Height:        Intake/Output Summary (Last 24 hours) at 05/10/17 0823 Last data filed at 05/10/17 0600  Gross per 24 hour  Intake           755.45 ml  Output              625 ml  Net           130.45 ml   Filed Weights   05/09/17 1004 05/10/17 0500  Weight: 51.7 kg (114 lb) 52 kg (114 lb 10.2 oz)    Examination:  General exam: Appears calm and comfortable, pale.  Respiratory system: Clear to auscultation. Respiratory effort normal. Cardiovascular system: S1 &  S2 heard, RRR. No JVD, murmurs, rubs, gallops or clicks. Plus 2 edema  Gastrointestinal system: Abdomen is nondistended, soft and nontender. No organomegaly or masses felt. Normal bowel sounds heard. Central nervous system: Alert and oriented. No focal neurological deficits. Extremities: Symmetric 5 x 5 power. Skin: No rashes, lesions or ulcers Psychiatry: Judgement and insight appear normal. Mood & affect appropriate.     Data Reviewed: I have personally reviewed following labs and imaging studies  CBC:  Recent Labs Lab 05/09/17 1042 05/09/17 1515  WBC 9.6 8.6  NEUTROABS 8.2*  --   HGB 8.9* 8.4*  HCT 27.4* 25.8*  MCV 89.0 90.2  PLT 407* 432*   Basic Metabolic Panel:  Recent Labs Lab 05/09/17 1042 05/09/17 1515 05/09/17 1903 05/09/17 2241  05/10/17 0413  NA 139 139 140 142 140  K 3.9 3.2* 3.0* 2.8* 3.5  CL 109 109 110 112* 111  CO2 16* 16* 19* 20* 20*  GLUCOSE 706* 612* 406* 137* 84  BUN 44* 47* 45* 46* 44*  CREATININE 2.82* 3.04* 2.94* 2.81* 2.77*  CALCIUM 7.7* 7.6* 7.5* 7.4* 7.7*   GFR: Estimated Creatinine Clearance: 15.2 mL/min (A) (by C-G formula based on SCr of 2.77 mg/dL (H)). Liver Function Tests:  Recent Labs Lab 05/09/17 1042  AST 16  ALT 11*  ALKPHOS 108  BILITOT 0.8  PROT 5.4*  ALBUMIN 1.9*   No results for input(s): LIPASE, AMYLASE in the last 168 hours. No results for input(s): AMMONIA in the last 168 hours. Coagulation Profile: No results for input(s): INR, PROTIME in the last 168 hours. Cardiac Enzymes:  Recent Labs Lab 05/09/17 1042  TROPONINI <0.03   BNP (last 3 results) No results for input(s): PROBNP in the last 8760 hours. HbA1C:  Recent Labs  05/09/17 1515  HGBA1C 8.7*   CBG:  Recent Labs Lab 05/09/17 2245 05/10/17 0222 05/10/17 0245 05/10/17 0311 05/10/17 0741  GLUCAP 123* 28* 30* 99 94   Lipid Profile: No results for input(s): CHOL, HDL, LDLCALC, TRIG, CHOLHDL, LDLDIRECT in the last 72 hours. Thyroid Function Tests:  Recent Labs  05/09/17 1515  TSH 6.197*   Anemia Panel:  Recent Labs  05/09/17 1515  VITAMINB12 399   Sepsis Labs: No results for input(s): PROCALCITON, LATICACIDVEN in the last 168 hours.  Recent Results (from the past 240 hour(s))  MRSA PCR Screening     Status: None   Collection Time: 05/09/17  3:00 PM  Result Value Ref Range Status   MRSA by PCR NEGATIVE NEGATIVE Final    Comment:        The GeneXpert MRSA Assay (FDA approved for NASAL specimens only), is one component of a comprehensive MRSA colonization surveillance program. It is not intended to diagnose MRSA infection nor to guide or monitor treatment for MRSA infections.          Radiology Studies: Dg Chest 2 View  Result Date: 05/09/2017 CLINICAL DATA:   Edema to lower extremity over last several days and awoke with new edema to bilat hands and face with eyes almost swollen shut, some cough, no other chest complaints EXAM: CHEST - 2 VIEW COMPARISON:  01/07/2017 and previous FINDINGS: Small bilateral pleural effusions with adjacent atelectasis/ consolidation posteriorly in the lower lobes.Lungs are otherwise clear. Heart size and mediastinal contours are within normal limits. No pneumothorax. Cervical fixation hardware partially visualized. Surgical clips in the upper abdomen. IMPRESSION: Small bilateral pleural effusions, new since prior study Electronically Signed   By: Ronald Pippins.D.  On: 05/09/2017 11:22   Ct Head Wo Contrast  Result Date: 05/09/2017 CLINICAL DATA:  Blurred vision, headaches, altered mental status. EXAM: CT HEAD WITHOUT CONTRAST TECHNIQUE: Contiguous axial images were obtained from the base of the skull through the vertex without intravenous contrast. COMPARISON:  12/11/2016 FINDINGS: Brain: There is atrophy and chronic small vessel disease changes. No acute intracranial abnormality. Specifically, no hemorrhage, hydrocephalus, mass lesion, acute infarction, or significant intracranial injury. Vascular: No hyperdense vessel or unexpected calcification. Skull: No acute calvarial abnormality. Sinuses/Orbits: Visualized paranasal sinuses and mastoids clear. Orbital soft tissues unremarkable. Other: None IMPRESSION: No acute intracranial abnormality. Atrophy, chronic microvascular disease. Electronically Signed   By: Charlett Nose M.D.   On: 05/09/2017 11:11        Scheduled Meds: . atorvastatin  10 mg Oral q1800  . carvedilol  3.125 mg Oral BID WC  . dextrose  1 ampule Intravenous Once  . escitalopram  15 mg Oral Daily  . furosemide  80 mg Intravenous TID  . heparin  5,000 Units Subcutaneous Q8H  . insulin aspart  0-9 Units Subcutaneous TID WC  . insulin glargine  5 Units Subcutaneous QHS  . levothyroxine  175 mcg Oral QAC  breakfast  . sodium bicarbonate  650 mg Oral BID   Continuous Infusions:   LOS: 1 day    Time spent: 35 minutes.     Alba Cory, MD [facility] Pager [phone]  If 7PM-7AM, please contact night-coverage [email or web address] Password [facility] 05/10/2017, 8:23 AM

## 2017-05-11 ENCOUNTER — Inpatient Hospital Stay (HOSPITAL_COMMUNITY): Payer: Medicare Other

## 2017-05-11 DIAGNOSIS — I360 Nonrheumatic tricuspid (valve) stenosis: Secondary | ICD-10-CM

## 2017-05-11 LAB — BASIC METABOLIC PANEL
ANION GAP: 11 (ref 5–15)
BUN: 38 mg/dL — ABNORMAL HIGH (ref 6–20)
CO2: 21 mmol/L — ABNORMAL LOW (ref 22–32)
Calcium: 7.5 mg/dL — ABNORMAL LOW (ref 8.9–10.3)
Chloride: 107 mmol/L (ref 101–111)
Creatinine, Ser: 2.66 mg/dL — ABNORMAL HIGH (ref 0.44–1.00)
GFR calc non Af Amer: 17 mL/min — ABNORMAL LOW (ref >60)
GFR, EST AFRICAN AMERICAN: 20 mL/min — AB (ref >60)
GLUCOSE: 260 mg/dL — AB (ref 65–99)
Potassium: 3.5 mmol/L (ref 3.5–5.1)
Sodium: 139 mmol/L (ref 135–145)

## 2017-05-11 LAB — GLUCOSE, CAPILLARY
GLUCOSE-CAPILLARY: 87 mg/dL (ref 65–99)
Glucose-Capillary: 258 mg/dL — ABNORMAL HIGH (ref 65–99)
Glucose-Capillary: 295 mg/dL — ABNORMAL HIGH (ref 65–99)
Glucose-Capillary: 110 mg/dL — ABNORMAL HIGH (ref 65–99)
Glucose-Capillary: 125 mg/dL — ABNORMAL HIGH (ref 65–99)

## 2017-05-11 LAB — IRON AND TIBC
IRON: 38 ug/dL (ref 28–170)
Saturation Ratios: 20 % (ref 10.4–31.8)
TIBC: 189 ug/dL — ABNORMAL LOW (ref 250–450)
UIBC: 151 ug/dL

## 2017-05-11 LAB — ECHOCARDIOGRAM COMPLETE
HEIGHTINCHES: 62 in
WEIGHTICAEL: 2074.09 [oz_av]

## 2017-05-11 MED ORDER — VITAMIN B-12 100 MCG PO TABS
50.0000 ug | ORAL_TABLET | Freq: Every day | ORAL | Status: DC
  Administered 2017-05-11 – 2017-05-14 (×4): 50 ug via ORAL
  Filled 2017-05-11 (×4): qty 1

## 2017-05-11 MED ORDER — PANTOPRAZOLE SODIUM 40 MG IV SOLR
40.0000 mg | Freq: Two times a day (BID) | INTRAVENOUS | Status: DC
  Administered 2017-05-11 – 2017-05-14 (×7): 40 mg via INTRAVENOUS
  Filled 2017-05-11 (×5): qty 40

## 2017-05-11 MED ORDER — ENSURE ENLIVE PO LIQD
237.0000 mL | Freq: Three times a day (TID) | ORAL | Status: DC
Start: 2017-05-11 — End: 2017-05-14
  Administered 2017-05-11 – 2017-05-12 (×3): 237 mL via ORAL

## 2017-05-11 MED ORDER — INSULIN GLARGINE 100 UNIT/ML ~~LOC~~ SOLN
4.0000 [IU] | Freq: Every day | SUBCUTANEOUS | Status: DC
  Administered 2017-05-11 – 2017-05-12 (×2): 4 [IU] via SUBCUTANEOUS
  Filled 2017-05-11 (×3): qty 0.04

## 2017-05-11 MED ORDER — POTASSIUM CHLORIDE CRYS ER 20 MEQ PO TBCR
20.0000 meq | EXTENDED_RELEASE_TABLET | Freq: Once | ORAL | Status: AC
  Administered 2017-05-11: 20 meq via ORAL
  Filled 2017-05-11: qty 1

## 2017-05-11 NOTE — Progress Notes (Signed)
[facility] ROUNDING NOTE   Subjective:   Interval History:Orlena A McFaydenis a 71 y.o.femalewith medical history significant of brittle insulin-dependent type 2 diabetes, severe hypothyroidism (recent TSH 136 on 01/06/17), chronic kidney disease stage III, chronic diastolic heart failure who presented to the ED for evaluation of the above-noted complaints. Per patient, for the past 1 week or so, she has noticed worsening swelling in her legs and arms. Per patient, swelling in the legs has slowly worsened, to the extent that she feels that the swelling is now in the thighs. This morning, her husband noted facial swelling, mostly right periorbital puffiness. As a result she sought medical attention, EMS was called and patient was brought to the emergency room. She reports hypoglycemia at home. She presents with worsening swelling LE, arms. She was found to be in DKA. Baseline serum creatinine appears in the 1.8 - 2.2 range  She was seen by Dr Allena Katz last year and at that time had acute on chronic renal disease. She denies the use of NSAID medication. No Family history of renal disease.  Objective:  Vital signs in last 24 hours:  Temp:  [99 F (37.2 C)-100.1 F (37.8 C)] 100.1 F (37.8 C) (05/27 0932) Pulse Rate:  [73-86] 86 (05/27 0932) Resp:  [9-18] 16 (05/27 0932) BP: (124-183)/(43-110) 159/66 (05/27 0932) SpO2:  [91 %-100 %] 91 % (05/27 0932) Weight:  [129 lb 10.1 oz (58.8 kg)] 129 lb 10.1 oz (58.8 kg) (05/27 1124)  Weight change:  Filed Weights   05/09/17 1004 05/10/17 0500 05/11/17 1124  Weight: 114 lb (51.7 kg) 114 lb 10.2 oz (52 kg) 129 lb 10.1 oz (58.8 kg)    Intake/Output: I/O last 3 completed shifts: In: 1520 [P.O.:1080; I.V.:440] Out: 2875 [Urine:2800; Emesis/NG output:75]   Intake/Output this shift:  No intake/output data recorded.  CVS- RRR RS- CTA ABD- BS present soft non-distended EXT- 1 +  edema   Basic Metabolic Panel:  Recent Labs Lab  05/09/17 1515 05/09/17 1903 05/09/17 2241 05/10/17 0413 05/11/17 0737  NA 139 140 142 140 139  K 3.2* 3.0* 2.8* 3.5 3.5  CL 109 110 112* 111 107  CO2 16* 19* 20* 20* 21*  GLUCOSE 612* 406* 137* 84 260*  BUN 47* 45* 46* 44* 38*  CREATININE 3.04* 2.94* 2.81* 2.77* 2.66*  CALCIUM 7.6* 7.5* 7.4* 7.7* 7.5*    Liver Function Tests:  Recent Labs Lab 05/09/17 1042  AST 16  ALT 11*  ALKPHOS 108  BILITOT 0.8  PROT 5.4*  ALBUMIN 1.9*   No results for input(s): LIPASE, AMYLASE in the last 168 hours. No results for input(s): AMMONIA in the last 168 hours.  CBC:  Recent Labs Lab 05/09/17 1042 05/09/17 1515  WBC 9.6 8.6  NEUTROABS 8.2*  --   HGB 8.9* 8.4*  HCT 27.4* 25.8*  MCV 89.0 90.2  PLT 407* 432*    Cardiac Enzymes:  Recent Labs Lab 05/09/17 1042  TROPONINI <0.03    BNP: Invalid input(s): POCBNP  CBG:  Recent Labs Lab 05/10/17 1310 05/10/17 1658 05/11/17 0020 05/11/17 0750 05/11/17 1131  GLUCAP 82 83 87 258* 295*    Microbiology: Results for orders placed or performed during the hospital encounter of 05/09/17  MRSA PCR Screening     Status: None   Collection Time: 05/09/17  3:00 PM  Result Value Ref Range Status   MRSA by PCR NEGATIVE NEGATIVE Final    Comment:        The GeneXpert MRSA Assay (  FDA approved for NASAL specimens only), is one component of a comprehensive MRSA colonization surveillance program. It is not intended to diagnose MRSA infection nor to guide or monitor treatment for MRSA infections.     Coagulation Studies: No results for input(s): LABPROT, INR in the last 72 hours.  Urinalysis:  Recent Labs  05/09/17 1042  COLORURINE YELLOW  LABSPEC 1.018  PHURINE 5.0  GLUCOSEU >=500*  HGBUR SMALL*  BILIRUBINUR NEGATIVE  KETONESUR 20*  PROTEINUR >=300*  NITRITE NEGATIVE  LEUKOCYTESUR SMALL*      Imaging: Dg Abd 1 View  Result Date: 05/11/2017 CLINICAL DATA:  Abdominal distension. EXAM: ABDOMEN - 1 VIEW  COMPARISON:  None. FINDINGS: The bowel gas pattern is normal. No radio-opaque calculi or other significant radiographic abnormality are seen. IMPRESSION: Negative. Electronically Signed   By: Gerome Samavid  Williams III M.D   On: 05/11/2017 09:29   [place] Renal  Result Date: 05/10/2017 CLINICAL DATA:  Chronic renal failure EXAM: RENAL / URINARY TRACT ULTRASOUND COMPLETE COMPARISON:  01/04/2017 FINDINGS: Right Kidney: Length: 9.9 cm. Mild increased echogenicity is noted without obstructive change. Left Kidney: Length: 9.6 cm.  Mild increased echogenicity is noted. Bladder: Decompressed by Foley catheter. Small left pleural effusion is seen as well as mild ascites. IMPRESSION: Increased echogenicity consistent with medical renal disease. Mild ascites and left pleural effusion. Electronically Signed   By: Alcide CleverMark  Lukens M.D.   On: 05/10/2017 16:18     Medications:    . atorvastatin  10 mg Oral q1800  . carvedilol  3.125 mg Oral BID WC  . dextrose  1 ampule Intravenous Once  . escitalopram  15 mg Oral Daily  . furosemide  80 mg Intravenous TID  . heparin  5,000 Units Subcutaneous Q8H  . insulin aspart  0-9 Units Subcutaneous TID WC  . insulin glargine  4 Units Subcutaneous Daily  . levothyroxine  175 mcg Oral QAC breakfast  . sodium bicarbonate  650 mg Oral BID  . 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 I think a protein/ creatinine ratio would be helpful. The acute on chronic changes are most likely hemodynamically mediated. There is no use of ACE or ARB. He has some hemodynamic instability although is diuresing about 1-2 L with IV lasix   HTN/ volume appears to be massively fluid overloaded and would benefit from diuresis  Anemia   Tsats 20 %   administer aranesp   PTH pending   Doing well with diuresis  She will need a nephrologist as an outpatient as I think we are headed to dialysis  Make appointment 1-2 months with [facility]    LOS: 2 Abdifatah Colquhoun W @TODAY @12 :22 PM

## 2017-05-11 NOTE — Progress Notes (Addendum)
PROGRESS NOTE    Cassandra [name]  WUJ:[id number] DOB: 1946/01/30 DOA: 05/09/2017 PCP: Henrine Screwshacker, Robert, MD    Brief Narrative: Cassandra [name] is a 71 y.o. female with medical history significant of  brittle insulin-dependent type 2 diabetes, severe hypothyroidism (recent TSH 136 on 01/06/17), chronic kidney disease stage III, chronic diastolic heart failure who presented to the ED for evaluation of the above-noted complaints. Per patient, for the past 1 week or so, she has noticed worsening swelling in her legs and arms. Per patient, swelling in the legs has slowly worsened, to the extent that she feels that the swelling is now in the thighs. This morning, her husband noted facial swelling, mostly right periorbital puffiness. As a result she sought medical attention, EMS was called and patient was brought to the emergency room. She reports hypoglycemia at home. She presents with worsening swelling LE, arms. She was found to be in DKA.    Assessment & Plan:   Principal Problem:   Anasarca Active Problems:   CKD (chronic kidney disease) stage 3, GFR 30-59 ml/min   Uncontrolled Hypothyroidism   HLD (hyperlipidemia)   Left ventricular diastolic dysfunction, NYHA class 1   AKI (acute kidney injury) (HCC)   DKA, type 1 (HCC)  1-DKA,  DM; resolved. Gap normalized bicarb increased.  She was transition from insulin Gtt to lantus. She was started on 10 units of lantus. Her Blood sugar drop to 28.  -start lantus 4 units daily  -SSI.   2-Acute on chronic renal failure, anasarca; last cr per records at 1.8---2.2 Cr on admission at 3----2.6 Started on oral bicarb.  Continue with IV lasix.  Appreciate Dr Web help.  She will need voiding trial.  US negative for obstruction.  2.4 l urine out put.  Negative 1.1 L.   3-Hypothyroidism; continue with synthroid, dose recently increased.  TSH ;6.19  4-Acute on Chronic Diastolic HF;  on IV lasix.  Chest x ray with small bilateral effusion.    5-History of vitamin B12 deficiency: B 12 339. Start oral supplements.   6-Nausea, vomiting. Check KUB.  Report having BM today  Start IV protonix.   DVT prophylaxis: Heparin  Code Status: full code.  Family Communication: care discussed with patient.  Disposition Plan:  PT, OT evaluation.    Consultants:   nephology    Procedures:   none  Antimicrobials: none  Subjective: Still with fluid in her leg, report nausea and vomiting overnight,   Objective: Vitals:   05/10/17 1340 05/10/17 1700 05/10/17 2252 05/11/17 0504  BP: (!) 124/43 (!) 127/56 (!) 154/67 (!) 145/63  Pulse: 80 73 80 81  Resp: 14  16 18   Temp: 99.4 F (37.4 C)  99.4 F (37.4 C) 99 F (37.2 C)  TempSrc: Oral  Oral Oral  SpO2: 100%  98% 94%  Weight:      Height: 5\' 2"  (1.575 m)       Intake/Output Summary (Last 24 hours) at 05/11/17 0855 Last data filed at 05/11/17 0504  Gross per 24 hour  Intake             1080 ml  Output             2400 ml  Net            -1320 ml   Filed Weights   05/09/17 1004 05/10/17 0500  Weight: 51.7 kg (114 lb) 52 kg (114 lb 10.2 oz)    Examination:  General exam: NAD Respiratory  system: Normal respiratory effort, no wheezing. Cardiovascular system: S1 & S2 heard, RRR. No JVD, murmurs, rubs, gallops or clicks.   Gastrointestinal system: BS present, soft, mild tenderness.  Central nervous system: Alert and oriented. No focal neurological deficits. Extremities: plus 2 edema Skin: no rash      Data Reviewed: I have personally reviewed following labs and imaging studies  CBC:  Recent Labs Lab 05/09/17 1042 05/09/17 1515  WBC 9.6 8.6  NEUTROABS 8.2*  --   HGB 8.9* 8.4*  HCT 27.4* 25.8*  MCV 89.0 90.2  PLT 407* 432*   Basic Metabolic Panel:  Recent Labs Lab 05/09/17 1515 05/09/17 1903 05/09/17 2241 05/10/17 0413 05/11/17 0737  NA 139 140 142 140 139  K 3.2* 3.0* 2.8* 3.5 3.5  CL 109 110 112* 111 107  CO2 16* 19* 20* 20* 21*  GLUCOSE  612* 406* 137* 84 260*  BUN 47* 45* 46* 44* 38*  CREATININE 3.04* 2.94* 2.81* 2.77* 2.66*  CALCIUM 7.6* 7.5* 7.4* 7.7* 7.5*   GFR: Estimated Creatinine Clearance: 15.8 mL/min (A) (by C-G formula based on SCr of 2.66 mg/dL (H)). Liver Function Tests:  Recent Labs Lab 05/09/17 1042  AST 16  ALT 11*  ALKPHOS 108  BILITOT 0.8  PROT 5.4*  ALBUMIN 1.9*   No results for input(s): LIPASE, AMYLASE in the last 168 hours. No results for input(s): AMMONIA in the last 168 hours. Coagulation Profile: No results for input(s): INR, PROTIME in the last 168 hours. Cardiac Enzymes:  Recent Labs Lab 05/09/17 1042  TROPONINI <0.03   BNP (last 3 results) No results for input(s): PROBNP in the last 8760 hours. HbA1C:  Recent Labs  05/09/17 1515  HGBA1C 8.7*   CBG:  Recent Labs Lab 05/10/17 1151 05/10/17 1310 05/10/17 1658 05/11/17 0020 05/11/17 0750  GLUCAP 84 82 83 87 258*   Lipid Profile: No results for input(s): CHOL, HDL, LDLCALC, TRIG, CHOLHDL, LDLDIRECT in the last 72 hours. Thyroid Function Tests:  Recent Labs  05/09/17 1515  TSH 6.197*   Anemia Panel:  Recent Labs  05/09/17 1515 05/10/17 2019  VITAMINB12 399  --   TIBC  --  189*  IRON  --  38   Sepsis Labs: No results for input(s): PROCALCITON, LATICACIDVEN in the last 168 hours.  Recent Results (from the past 240 hour(s))  MRSA PCR Screening     Status: None   Collection Time: 05/09/17  3:00 PM  Result Value Ref Range Status   MRSA by PCR NEGATIVE NEGATIVE Final    Comment:        The GeneXpert MRSA Assay (FDA approved for NASAL specimens only), is one component of a comprehensive MRSA colonization surveillance program. It is not intended to diagnose MRSA infection nor to guide or monitor treatment for MRSA infections.          Radiology Studies: Dg Chest 2 View  Result Date: 05/09/2017 CLINICAL DATA:  Edema to lower extremity over last several days and awoke with new edema to bilat  hands and face with eyes almost swollen shut, some cough, no other chest complaints EXAM: CHEST - 2 VIEW COMPARISON:  01/07/2017 and previous FINDINGS: Small bilateral pleural effusions with adjacent atelectasis/ consolidation posteriorly in the lower lobes.Lungs are otherwise clear. Heart size and mediastinal contours are within normal limits. No pneumothorax. Cervical fixation hardware partially visualized. Surgical clips in the upper abdomen. IMPRESSION: Small bilateral pleural effusions, new since prior study Electronically Signed   By: Corlis Leak  M.D.   On: 05/09/2017 11:22   Ct Head Wo Contrast  Result Date: 05/09/2017 CLINICAL DATA:  Blurred vision, headaches, altered mental status. EXAM: CT HEAD WITHOUT CONTRAST TECHNIQUE: Contiguous axial images were obtained from the base of the skull through the vertex without intravenous contrast. COMPARISON:  12/11/2016 FINDINGS: Brain: There is atrophy and chronic small vessel disease changes. No acute intracranial abnormality. Specifically, no hemorrhage, hydrocephalus, mass lesion, acute infarction, or significant intracranial injury. Vascular: No hyperdense vessel or unexpected calcification. Skull: No acute calvarial abnormality. Sinuses/Orbits: Visualized paranasal sinuses and mastoids clear. Orbital soft tissues unremarkable. Other: None IMPRESSION: No acute intracranial abnormality. Atrophy, chronic microvascular disease. Electronically Signed   By: Charlett Nose M.D.   On: 05/09/2017 11:11   US Renal  Result Date: 05/10/2017 CLINICAL DATA:  Chronic renal failure EXAM: RENAL / URINARY TRACT ULTRASOUND COMPLETE COMPARISON:  01/04/2017 FINDINGS: Right Kidney: Length: 9.9 cm. Mild increased echogenicity is noted without obstructive change. Left Kidney: Length: 9.6 cm.  Mild increased echogenicity is noted. Bladder: Decompressed by Foley catheter. Small left pleural effusion is seen as well as mild ascites. IMPRESSION: Increased echogenicity consistent with  medical renal disease. Mild ascites and left pleural effusion. Electronically Signed   By: Alcide Clever M.D.   On: 05/10/2017 16:18        Scheduled Meds: . atorvastatin  10 mg Oral q1800  . carvedilol  3.125 mg Oral BID WC  . dextrose  1 ampule Intravenous Once  . escitalopram  15 mg Oral Daily  . furosemide  80 mg Intravenous TID  . heparin  5,000 Units Subcutaneous Q8H  . insulin aspart  0-9 Units Subcutaneous TID WC  . levothyroxine  175 mcg Oral QAC breakfast  . sodium bicarbonate  650 mg Oral BID   Continuous Infusions:   LOS: 2 days    Time spent: 35 minutes.     Alba Cory, MD [facility] Pager [phone]  If 7PM-7AM, please contact night-coverage [email or web address] Password [facility] 05/11/2017, 8:55 AM

## 2017-05-11 NOTE — Progress Notes (Signed)
Initial Nutrition Assessment  DOCUMENTATION CODES:   Severe malnutrition in context of chronic illness  INTERVENTION:   Ensure Enlive po TID, each supplement provides 350 kcal and 20 grams of protein  NUTRITION DIAGNOSIS:   Malnutrition (severe) related to chronic illness, DM, CKD, CHF  as evidenced by severe depletion of body fat, severe depletion of muscle mass.  GOAL:   Patient will meet greater than or equal to 90% of their needs  MONITOR:   PO intake, Supplement acceptance, Labs, I & O's  REASON FOR ASSESSMENT:   Consult Assessment of nutrition requirement/status  ASSESSMENT:   71 y.o. female with medical history significant of  brittle insulin-dependent type 2 diabetes, severe hypothyroidism (recent TSH 136 on 01/06/17), chronic kidney disease stage III, chronic diastolic heart failure who presented to the ED for evaluation of the above-noted complaints. Per patient, for the past 1 week or so, she has noticed worsening swelling in her legs and arms. Pt admitted for anasarca and DKA   Met with pt in room today. Pt reports that she feels terrible today. Pt reports intermittent decreased appetite and oral intake pta. Pt is currently eating 70% meals but reports that she vomited once yesterday and once this morning after eating. Pt reports that sometimes this happens at home too where she will eat and then vomit. RD unable to determine if pt has lost any weight r/t edema. Pt feels she has lost some weight but unable to state how much. Pt does like chocolate Ensure; RD will order. Pt with moderate anasarca today but RD still able to complete nutrition focused exam. Continue to encourage intake of meals and supplements.   Medications reviewed and include: lasix, heparin, insulin, synthroid, Na Bicarb, Vit B-12, zofran  Labs reviewed: BUN 38(H), creat 2.66(H), Ca 7.5(L) Hgb 8.4(L), Hct 25.8(L) cbgs- 612, 406, 137, 84, 260 x 48hrs AIC 8.7(H) -5/25  Nutrition-Focused physical exam  completed. Findings are severe fat depletions in upper arms and chest, moderate to severe muscle depletions over entire body, and anasarca.    Diet Order:  Diet Carb Modified Fluid consistency: Thin; Room service appropriate? Yes  Skin:  Wound (see comment) (anasarca)  Last BM:  5/26  Height:   Ht Readings from Last 1 Encounters:  05/10/17 5' 2" (1.575 m)    Weight:   Wt Readings from Last 1 Encounters:  05/11/17 129 lb 10.1 oz (58.8 kg)    Ideal Body Weight:  50 kg  BMI:  Body mass index is 23.71 kg/m.  Estimated Nutritional Needs:   Kcal:  1400-1700kcal/day   Protein:  83-94g/day   Fluid:  >1.4L/day  EDUCATION NEEDS:   Education needs no appropriate at this time  Koleen Distance MS, RD, LDN Pager #- [phone]

## 2017-05-11 NOTE — Progress Notes (Signed)
*  PRELIMINARY RESULTS* Echocardiogram 2D Echocardiogram has been performed.  Jeryl Columbialliott, Marcin Holte 05/11/2017, 2:23 PM

## 2017-05-12 LAB — BASIC METABOLIC PANEL
ANION GAP: 8 (ref 5–15)
BUN: 42 mg/dL — ABNORMAL HIGH (ref 6–20)
CO2: 24 mmol/L (ref 22–32)
Calcium: 7.2 mg/dL — ABNORMAL LOW (ref 8.9–10.3)
Chloride: 107 mmol/L (ref 101–111)
Creatinine, Ser: 2.68 mg/dL — ABNORMAL HIGH (ref 0.44–1.00)
GFR, EST AFRICAN AMERICAN: 20 mL/min — AB (ref >60)
GFR, EST NON AFRICAN AMERICAN: 17 mL/min — AB (ref >60)
Glucose, Bld: 350 mg/dL — ABNORMAL HIGH (ref 65–99)
POTASSIUM: 4.1 mmol/L (ref 3.5–5.1)
SODIUM: 139 mmol/L (ref 135–145)

## 2017-05-12 LAB — CBC
HEMATOCRIT: 21.6 % — AB (ref 36.0–46.0)
HEMOGLOBIN: 7.3 g/dL — AB (ref 12.0–15.0)
MCH: 29.8 pg (ref 26.0–34.0)
MCHC: 33.8 g/dL (ref 30.0–36.0)
MCV: 88.2 fL (ref 78.0–100.0)
Platelets: 332 10*3/uL (ref 150–400)
RBC: 2.45 MIL/uL — AB (ref 3.87–5.11)
RDW: 14.5 % (ref 11.5–15.5)
WBC: 8.5 10*3/uL (ref 4.0–10.5)

## 2017-05-12 LAB — GLUCOSE, CAPILLARY
GLUCOSE-CAPILLARY: 336 mg/dL — AB (ref 65–99)
Glucose-Capillary: 375 mg/dL — ABNORMAL HIGH (ref 65–99)
Glucose-Capillary: 385 mg/dL — ABNORMAL HIGH (ref 65–99)
Glucose-Capillary: 192 mg/dL — ABNORMAL HIGH (ref 65–99)

## 2017-05-12 LAB — PARATHYROID HORMONE, INTACT (NO CA): PTH: 82 pg/mL — AB (ref 15–65)

## 2017-05-12 LAB — PREPARE RBC (CROSSMATCH)

## 2017-05-12 MED ORDER — SODIUM CHLORIDE 0.9 % IV SOLN
Freq: Once | INTRAVENOUS | Status: AC
  Administered 2017-05-12: 10:00:00 via INTRAVENOUS

## 2017-05-12 MED ORDER — GABAPENTIN 100 MG PO CAPS
100.0000 mg | ORAL_CAPSULE | Freq: Every day | ORAL | Status: DC
  Administered 2017-05-12 – 2017-05-13 (×2): 100 mg via ORAL
  Filled 2017-05-12 (×2): qty 1

## 2017-05-12 MED ORDER — FUROSEMIDE 10 MG/ML IJ SOLN
20.0000 mg | Freq: Once | INTRAMUSCULAR | Status: AC
  Administered 2017-05-12: 20 mg via INTRAVENOUS
  Filled 2017-05-12: qty 2

## 2017-05-12 MED ORDER — AMLODIPINE BESYLATE 5 MG PO TABS
2.5000 mg | ORAL_TABLET | Freq: Every day | ORAL | Status: DC
  Administered 2017-05-12 – 2017-05-14 (×3): 2.5 mg via ORAL
  Filled 2017-05-12 (×3): qty 1

## 2017-05-12 NOTE — Progress Notes (Signed)
OT Cancellation Note  Patient Details Name: Merril AbbeBillie A Coil MRN: [id number] DOB: [birth year]   Cancelled Treatment:    Reason Eval/Treat Not Completed: Other (comment)  Spoke with RN. Pt currently getting blood. Will check back on pt as schedule allows Lise AuerLori Devynn Scheff, [place] [phone] [name], Binta Statzer D 05/12/2017, 1:37 PM

## 2017-05-12 NOTE — Progress Notes (Signed)
AM labs showed BS of 350. MD notified- gave VO to given AM Lantus 4 units now.

## 2017-05-12 NOTE — Progress Notes (Signed)
PROGRESS NOTE    Cassandra AbbeBillie A Teschner  GNF:[id number] DOB: 25-May-1946 DOA: 05/09/2017 PCP: Henrine Screwshacker, Robert, MD    Brief Narrative: Cassandra [name] is a 71 y.o. female with medical history significant of  brittle insulin-dependent type 2 diabetes, severe hypothyroidism (recent TSH 136 on 01/06/17), chronic kidney disease stage III, chronic diastolic heart failure who presented to the ED for evaluation of the above-noted complaints. Per patient, for the past 1 week or so, she has noticed worsening swelling in her legs and arms. Per patient, swelling in the legs has slowly worsened, to the extent that she feels that the swelling is now in the thighs. This morning, her husband noted facial swelling, mostly right periorbital puffiness. As a result she sought medical attention, EMS was called and patient was brought to the emergency room. She reports hypoglycemia at home. She presents with worsening swelling LE, arms. She was found to be in DKA.    Assessment & Plan:   Principal Problem:   Anasarca Active Problems:   CKD (chronic kidney disease) stage 3, GFR 30-59 ml/min   Uncontrolled Hypothyroidism   HLD (hyperlipidemia)   Left ventricular diastolic dysfunction, NYHA class 1   AKI (acute kidney injury) (HCC)   DKA, type 1 (HCC)  1-DKA,  DM; resolved. Gap normalized bicarb increased.  She was transition from insulin Gtt to lantus. She was started on 10 units of lantus. Her Blood sugar drop to 28.  -continue with lantus 4 units daily  -SSI. She is very sensitive to insulin. CBG at 385 this morning , will give 5 units   2-Acute on chronic renal failure, anasarca; last cr per records at 1.8---2.2 Cr on admission at 3----2.6--2.6 Continue with oral bicarb.  Continue with IV lasix. TID Appreciate Dr Web help.  Discontinue foley  US negative for obstruction.  Weight 122----regular weight 114.   3-Hypothyroidism; continue with synthroid, dose recently increased.  TSH ;6.19  4-Acute on  Chronic Diastolic HF;  on IV lasix.  Chest x ray with small bilateral effusion.  Continue with lasix, dry weight 114.  5-History of vitamin B12 deficiency: B 12 339. Start oral supplements.   6-Nausea, vomiting. Check KUB.  Report having BM today  Protonix.  Resolved.   7-Neuropathy;  Start low dose gabapentin.   8-Anemia; transfuse one unit.   DVT prophylaxis: Heparin  Code Status: full code.  Family Communication: care discussed with patient.  Disposition Plan:  PT, OT evaluation.    Consultants:   nephology    Procedures:   none  Antimicrobials: none  Subjective: No further vomiting.  Having problems with neuropathy, feet tingling.  Dry weight 114  Objective: Vitals:   05/11/17 1030 05/11/17 1124 05/11/17 2150 05/12/17 0618  BP:   (!) 161/68 (!) 174/70  Pulse:   76 83  Resp:   18 16  Temp: 98.5 F (36.9 C)  99.1 F (37.3 C) 98.9 F (37.2 C)  TempSrc:   Oral Oral  SpO2:   97% 95%  Weight:  58.8 kg (129 lb 10.1 oz)  55.7 kg (122 lb 12.7 oz)  Height:        Intake/Output Summary (Last 24 hours) at 05/12/17 0801 Last data filed at 05/12/17 0618  Gross per 24 hour  Intake              365 ml  Output             2825 ml  Net            -  2460 ml   Filed Weights   05/10/17 0500 05/11/17 1124 05/12/17 0618  Weight: 52 kg (114 lb 10.2 oz) 58.8 kg (129 lb 10.1 oz) 55.7 kg (122 lb 12.7 oz)    Examination:  General exam: NAD Respiratory system: CTA Cardiovascular system: S 1, S 2 RRR  Gastrointestinal system: BS present, soft, mild tenderness.  Central nervous system: Alert and oriented. No focal neurological deficits. Extremities: less edema Skin: no rash      Data Reviewed: I have personally reviewed following labs and imaging studies  CBC:  Recent Labs Lab 05/09/17 1042 05/09/17 1515 05/12/17 0445  WBC 9.6 8.6 8.5  NEUTROABS 8.2*  --   --   HGB 8.9* 8.4* 7.3*  HCT 27.4* 25.8* 21.6*  MCV 89.0 90.2 88.2  PLT 407* 432* 332   Basic  Metabolic Panel:  Recent Labs Lab 05/09/17 1903 05/09/17 2241 05/10/17 0413 05/11/17 0737 05/12/17 0445  NA 140 142 140 139 139  K 3.0* 2.8* 3.5 3.5 4.1  CL 110 112* 111 107 107  CO2 19* 20* 20* 21* 24  GLUCOSE 406* 137* 84 260* 350*  BUN 45* 46* 44* 38* 42*  CREATININE 2.94* 2.81* 2.77* 2.66* 2.68*  CALCIUM 7.5* 7.4* 7.7* 7.5* 7.2*   GFR: Estimated Creatinine Clearance: 15.7 mL/min (A) (by C-G formula based on SCr of 2.68 mg/dL (H)). Liver Function Tests:  Recent Labs Lab 05/09/17 1042  AST 16  ALT 11*  ALKPHOS 108  BILITOT 0.8  PROT 5.4*  ALBUMIN 1.9*   No results for input(s): LIPASE, AMYLASE in the last 168 hours. No results for input(s): AMMONIA in the last 168 hours. Coagulation Profile: No results for input(s): INR, PROTIME in the last 168 hours. Cardiac Enzymes:  Recent Labs Lab 05/09/17 1042  TROPONINI <0.03   BNP (last 3 results) No results for input(s): PROBNP in the last 8760 hours. HbA1C:  Recent Labs  05/09/17 1515  HGBA1C 8.7*   CBG:  Recent Labs Lab 05/11/17 1131 05/11/17 1720 05/11/17 2141 05/12/17 0606 05/12/17 0754  GLUCAP 295* 110* 125* 375* 385*   Lipid Profile: No results for input(s): CHOL, HDL, LDLCALC, TRIG, CHOLHDL, LDLDIRECT in the last 72 hours. Thyroid Function Tests:  Recent Labs  05/09/17 1515  TSH 6.197*   Anemia Panel:  Recent Labs  05/09/17 1515 05/10/17 2019  VITAMINB12 399  --   TIBC  --  189*  IRON  --  38   Sepsis Labs: No results for input(s): PROCALCITON, LATICACIDVEN in the last 168 hours.  Recent Results (from the past 240 hour(s))  MRSA PCR Screening     Status: None   Collection Time: 05/09/17  3:00 PM  Result Value Ref Range Status   MRSA by PCR NEGATIVE NEGATIVE Final    Comment:        The GeneXpert MRSA Assay (FDA approved for NASAL specimens only), is one component of a comprehensive MRSA colonization surveillance program. It is not intended to diagnose MRSA infection nor  to guide or monitor treatment for MRSA infections.          Radiology Studies: Dg Abd 1 View  Result Date: 05/11/2017 CLINICAL DATA:  Abdominal distension. EXAM: ABDOMEN - 1 VIEW COMPARISON:  None. FINDINGS: The bowel gas pattern is normal. No radio-opaque calculi or other significant radiographic abnormality are seen. IMPRESSION: Negative. Electronically Signed   By: Gerome Sam III M.D   On: 05/11/2017 09:29   US Renal  Result Date: 05/10/2017 CLINICAL DATA:  Chronic renal failure EXAM: RENAL / URINARY TRACT ULTRASOUND COMPLETE COMPARISON:  01/04/2017 FINDINGS: Right Kidney: Length: 9.9 cm. Mild increased echogenicity is noted without obstructive change. Left Kidney: Length: 9.6 cm.  Mild increased echogenicity is noted. Bladder: Decompressed by Foley catheter. Small left pleural effusion is seen as well as mild ascites. IMPRESSION: Increased echogenicity consistent with medical renal disease. Mild ascites and left pleural effusion. Electronically Signed   By: Alcide Clever M.D.   On: 05/10/2017 16:18        Scheduled Meds: . atorvastatin  10 mg Oral q1800  . carvedilol  3.125 mg Oral BID WC  . dextrose  1 ampule Intravenous Once  . escitalopram  15 mg Oral Daily  . feeding supplement (ENSURE ENLIVE)  237 mL Oral TID BM  . furosemide  20 mg Intravenous Once  . furosemide  80 mg Intravenous TID  . heparin  5,000 Units Subcutaneous Q8H  . insulin aspart  0-9 Units Subcutaneous TID WC  . insulin glargine  4 Units Subcutaneous Daily  . levothyroxine  175 mcg Oral QAC breakfast  . pantoprazole (PROTONIX) IV  40 mg Intravenous Q12H  . sodium bicarbonate  650 mg Oral BID  . vitamin B-12  50 mcg Oral Daily   Continuous Infusions: . sodium chloride       LOS: 3 days    Time spent: 35 minutes.     Alba Cory, MD [facility] Pager [phone]  If 7PM-7AM, please contact night-coverage [email or web address] Password [email or web address] 05/12/2017, 8:01 AM

## 2017-05-12 NOTE — Progress Notes (Signed)
Called CBG of 336 to MD, also called early am CBG of 385 to MD, adjusted insulin doses, due to previous hypoglycemic drop, MD request to be called for this morning and lunch time coverage to adjust doses appropriately. SRP, RN

## 2017-05-12 NOTE — Progress Notes (Signed)
Inpatient Diabetes Program Recommendations  AACE/ADA: New Consensus Statement on Inpatient Glycemic Control (2015)  Target Ranges:  Prepandial:   less than 140 mg/dL      Peak postprandial:   less than 180 mg/dL (1-2 hours)      Critically ill patients:  140 - 180 mg/dL   Lab Results  Component Value Date   GLUCAP 385 (H) 05/12/2017   HGBA1C 8.7 (H) 05/09/2017    Review of Glycemic Control  Blood sugars > 180 mg/dL in Type 1 DM. Needs meal coverage insulin when eating > 50% meal  Inpatient Diabetes Program Recommendations:    Increase Lantus to 5 units QD Add Novolog 2 units tidwc for meal coverage insulin if pt eats > 50% meal.  Will continue to follow.  Thank you. Ailene Ardshonda Rohail Klees, RD, LDN, CDE Inpatient Diabetes Coordinator [phone]

## 2017-05-12 NOTE — Evaluation (Addendum)
Physical Therapy Evaluation Patient Details Name: Cassandra [name] MRN: [id number] DOB: [birth year] Today's Date: 05/12/2017   History of Present Illness  71 yo female admitted with mild DKA, edema. Hx of bipolar d/o, CHF, CKD, DM, hypothyroidism, back sg, CVA, neuropathy    Clinical Impression  On eval, pt required Min assist for mobility. She was able to perform a stand pivot and take a few steps in room with a RW. Pt presents with general weakness, decreased activity tolerance, and impaired gait and balance. Pt c/o neck pain while mobilizing. Discussed d/c plan-pt refusing SNF placement, so will recommend HHPT and 24 hour supervision/assist if pt returns home. Will follow during hospital stay.     Follow Up Recommendations Home health PT;Supervision/Assistance - 24 hour vs SNF (pt refusing SNF placement during PT session)    Equipment Recommendations  None recommended by PT    Recommendations for Other Services       Precautions / Restrictions Precautions Precautions: Fall Restrictions Weight Bearing Restrictions: No      Mobility  Bed Mobility Overal bed mobility: Needs Assistance Bed Mobility: Supine to Sit;Sit to Supine     Supine to sit: Min assist Sit to supine: Min guard   General bed mobility comments: Assist for trunk. Increased time. Pt relied on bedrail  Transfers Overall transfer level: Needs assistance Equipment used: Rolling walker (2 wheeled) Transfers: Sit to/from [employer] Sit to Stand: Min assist Stand pivot transfers: Min assist       General transfer comment: Assist to rise, stabilize, control descent. Pt c/o legs feeling "stiff" and "weak". Stand pivot with RW. Increased time.   Ambulation/Gait   Ambulation Distance (Feet): 3 Feet Assistive device: Rolling walker (2 wheeled) Gait Pattern/deviations: Decreased stride length;Decreased step length - right;Decreased step length - left;Trunk flexed     General Gait Details:  Assist to stabilize pt and maneuver with walker. Increased time. Pt fatigues easily. She declined to walk beyond a few steps due to fatigue.   Stairs            Wheelchair Mobility    Modified Rankin (Stroke Patients Only)       Balance Overall balance assessment: Needs assistance           Standing balance-Leahy Scale: Poor                               Pertinent Vitals/Pain Pain Assessment: Faces Faces Pain Scale: Hurts even more Pain Location: neck with activity Pain Descriptors / Indicators: Sore;Sharp;Aching Pain Intervention(s): Limited activity within patient's tolerance;Repositioned    Home Living Family/patient expects to be discharged to:: Private residence Living Arrangements: Spouse/significant other Available Help at Discharge: Family;Available PRN/intermittently Type of Home: Apartment Home Access: Level entry     Home Layout: One level Home Equipment: Wheelchair - [employer] - 2 wheels;Shower seat      Prior Function Level of Independence: Needs assistance   Gait / Transfers Assistance Needed: uses RW           Hand Dominance        Extremity/Trunk Assessment   Upper Extremity Assessment Upper Extremity Assessment: Generalized weakness    Lower Extremity Assessment Lower Extremity Assessment: Generalized weakness    Cervical / Trunk Assessment Cervical / Trunk Assessment: Kyphotic  Communication   Communication: No difficulties  Cognition Arousal/Alertness: Awake/alert Behavior During Therapy: WFL for tasks assessed/performed Overall Cognitive Status: Within Functional Limits for tasks assessed  General Comments      Exercises     Assessment/Plan    PT Assessment Patient needs continued PT services  PT Problem List Decreased strength;Decreased mobility;Decreased activity tolerance;Decreased balance;Decreased knowledge of use of DME;Pain        PT Treatment Interventions DME instruction;Gait training;Therapeutic activities;Therapeutic exercise;Patient/family education;Balance training;Functional mobility training    PT Goals (Current goals can be found in the Care Plan section)  Acute Rehab PT Goals Patient Stated Goal: home PT Goal Formulation: With patient Time For Goal Achievement: 05/26/17 Potential to Achieve Goals: Fair    Frequency Min 3X/week   Barriers to discharge        Co-evaluation               AM-PAC PT "6 Clicks" Daily Activity  Outcome Measure Difficulty turning over in bed (including adjusting bedclothes, sheets and blankets)?: A Little Difficulty moving from lying on back to sitting on the side of the bed? : A Little Difficulty sitting down on and standing up from a chair with arms (e.g., wheelchair, bedside commode, etc,.)?: A Little Help needed moving to and from a bed to chair (including a wheelchair)?: A Little Help needed walking in hospital room?: A Little Help needed climbing 3-5 steps with a railing? : A Lot 6 Click Score: 17    End of Session Equipment Utilized During Treatment: Gait belt Activity Tolerance: Patient limited by fatigue Patient left: in bed;with call bell/phone within reach;with nursing/sitter in room   PT Visit Diagnosis: Muscle weakness (generalized) (M62.81);Difficulty in walking, not elsewhere classified (R26.2)    Time: 1447-1500 PT Time Calculation (min) (ACUTE ONLY): 13 min   Charges:   PT Evaluation $PT Eval Low Complexity: 1 Procedure     PT G CodesRebeca Alert:          Jevaughn Degollado [facility] 05/12/2017, 3:15 PM

## 2017-05-13 LAB — BASIC METABOLIC PANEL
ANION GAP: 8 (ref 5–15)
BUN: 46 mg/dL — ABNORMAL HIGH (ref 6–20)
CALCIUM: 7.3 mg/dL — AB (ref 8.9–10.3)
CO2: 28 mmol/L (ref 22–32)
Chloride: 104 mmol/L (ref 101–111)
Creatinine, Ser: 2.61 mg/dL — ABNORMAL HIGH (ref 0.44–1.00)
GFR, EST AFRICAN AMERICAN: 20 mL/min — AB (ref >60)
GFR, EST NON AFRICAN AMERICAN: 18 mL/min — AB (ref >60)
Glucose, Bld: 321 mg/dL — ABNORMAL HIGH (ref 65–99)
POTASSIUM: 3.7 mmol/L (ref 3.5–5.1)
Sodium: 140 mmol/L (ref 135–145)

## 2017-05-13 LAB — TYPE AND SCREEN
ABO/RH(D): A POS
Antibody Screen: NEGATIVE
UNIT DIVISION: 0

## 2017-05-13 LAB — GLUCOSE, CAPILLARY
GLUCOSE-CAPILLARY: 371 mg/dL — AB (ref 65–99)
Glucose-Capillary: 247 mg/dL — ABNORMAL HIGH (ref 65–99)
Glucose-Capillary: 259 mg/dL — ABNORMAL HIGH (ref 65–99)
Glucose-Capillary: 283 mg/dL — ABNORMAL HIGH (ref 65–99)
Glucose-Capillary: 348 mg/dL — ABNORMAL HIGH (ref 65–99)
Glucose-Capillary: 505 mg/dL (ref 65–99)

## 2017-05-13 LAB — CBC
HEMATOCRIT: 31.9 % — AB (ref 36.0–46.0)
Hemoglobin: 10.9 g/dL — ABNORMAL LOW (ref 12.0–15.0)
MCH: 28.6 pg (ref 26.0–34.0)
MCHC: 34.2 g/dL (ref 30.0–36.0)
MCV: 83.7 fL (ref 78.0–100.0)
PLATELETS: 306 10*3/uL (ref 150–400)
RBC: 3.81 MIL/uL — AB (ref 3.87–5.11)
RDW: 16.1 % — ABNORMAL HIGH (ref 11.5–15.5)
WBC: 9.2 10*3/uL (ref 4.0–10.5)

## 2017-05-13 LAB — BPAM RBC
BLOOD PRODUCT EXPIRATION DATE: 201806072359
ISSUE DATE / TIME: 201805281105
UNIT TYPE AND RH: 6200

## 2017-05-13 MED ORDER — INSULIN GLARGINE 100 UNIT/ML ~~LOC~~ SOLN
6.0000 [IU] | Freq: Every day | SUBCUTANEOUS | Status: DC
  Administered 2017-05-13 – 2017-05-14 (×2): 6 [IU] via SUBCUTANEOUS
  Filled 2017-05-13 (×2): qty 0.06

## 2017-05-13 MED ORDER — METOCLOPRAMIDE HCL 5 MG PO TABS
5.0000 mg | ORAL_TABLET | Freq: Three times a day (TID) | ORAL | Status: DC
  Administered 2017-05-13 – 2017-05-14 (×4): 5 mg via ORAL
  Filled 2017-05-13 (×4): qty 1

## 2017-05-13 MED ORDER — FUROSEMIDE 40 MG PO TABS
60.0000 mg | ORAL_TABLET | Freq: Two times a day (BID) | ORAL | Status: DC
  Administered 2017-05-13 – 2017-05-14 (×3): 60 mg via ORAL
  Filled 2017-05-13 (×3): qty 1

## 2017-05-13 MED ORDER — ACETAMINOPHEN 325 MG PO TABS
650.0000 mg | ORAL_TABLET | Freq: Four times a day (QID) | ORAL | Status: DC | PRN
Start: 2017-05-13 — End: 2017-05-14
  Administered 2017-05-14: 650 mg via ORAL
  Filled 2017-05-13 (×2): qty 2

## 2017-05-13 NOTE — Progress Notes (Signed)
PROGRESS NOTE    Cassandra [name]  NWG:[id number] DOB: 04-11-1946 DOA: 05/09/2017 PCP: Henrine Screws, MD    Brief Narrative: Cassandra [name] is a 71 y.o. female with medical history significant of  brittle insulin-dependent type 2 diabetes, severe hypothyroidism (recent TSH 136 on 01/06/17), chronic kidney disease stage III, chronic diastolic heart failure who presented to the ED for evaluation of the above-noted complaints. Per patient, for the past 1 week or so, she has noticed worsening swelling in her legs and arms. Per patient, swelling in the legs has slowly worsened, to the extent that she feels that the swelling is now in the thighs. This morning, her husband noted facial swelling, mostly right periorbital puffiness. As a result she sought medical attention, EMS was called and patient was brought to the emergency room. She reports hypoglycemia at home. She presents with worsening swelling LE, arms. She was found to be in DKA.   Admitted with DKA, and anasarca, CKD  Assessment & Plan:   Principal Problem:   Anasarca Active Problems:   CKD (chronic kidney disease) stage 3, GFR 30-59 ml/min   Uncontrolled Hypothyroidism   HLD (hyperlipidemia)   Left ventricular diastolic dysfunction, NYHA class 1   AKI (acute kidney injury) (HCC)   DKA, type 1 (HCC)  1-DKA,  DM; brittle Diabetic  resolved. Gap normalized bicarb increased.  She was transition from insulin Gtt to lantus. She was started on 10 units of lantus. Her Blood sugar drop to 28.  -CBG in the 300. Increase lantus to 6 units.  -SSI. She is very sensitive to insulin.   2-Acute on chronic renal failure stage III, anasarca; last cr per records at 1.8---2.2 Cr on admission at 3----2.6--2.6--2.6 Continue with oral bicarb.  Treated with  IV lasix. TID--change to 60 mg PO, BID on 5-29.  Appreciate Dr Web help. Follow out-patient.  Discontinue foley  [place] negative for obstruction.  Weight 122----regular weight 114.    Significantly Less edema.   3-Hypothyroidism; continue with synthroid, dose recently increased.  TSH ;6.19  4-Acute on Chronic Diastolic HF;  on IV lasix.  Chest x ray with small bilateral effusion.  Continue with lasix.   5-History of vitamin B12 deficiency: B 12 339. Started oral supplements.   6-Gastroparesis. Nausea, vomiting. KUB negative.  Protonix.  Had another episode of vomiting today. Report similar problems at home.  Suspect gastroparesis. Will start Reglan.   7-Neuropathy;  Started  low dose gabapentin.   8-Anemia; Received one unit PRBC. Hb at 10   DVT prophylaxis: Heparin  Code Status: full code.  Family Communication: care discussed with patient.  Disposition Plan:  Discharge 5-30   Consultants:   nephology    Procedures:   none  Antimicrobials: none  Subjective: Vomited this morning.  Not feeling well.    Objective: Vitals:   05/12/17 2300 05/13/17 0541 05/13/17 0554 05/13/17 1303  BP: (!) 164/82  (!) 169/69 140/61  Pulse: 82  80 72  Resp: 20  18 18   Temp: 99.4 F (37.4 C)  98.3 F (36.8 C) 98.7 F (37.1 C)  TempSrc: Oral  Oral Oral  SpO2: 98%  96% 97%  Weight:  56.5 kg (124 lb 9 oz)    Height:        Intake/Output Summary (Last 24 hours) at 05/13/17 1532 Last data filed at 05/13/17 0900  Gross per 24 hour  Intake              120 ml  Output  1000 ml  Net             -880 ml   Filed Weights   05/11/17 1124 05/12/17 0618 05/13/17 0541  Weight: 58.8 kg (129 lb 10.1 oz) 55.7 kg (122 lb 12.7 oz) 56.5 kg (124 lb 9 oz)    Examination:  General exam: NAD Respiratory system: CTA Cardiovascular system: S 1, S 2 RRR  Gastrointestinal system: BS present, soft, mild tenderness.  Central nervous system: non focal.  Extremities: trace edema.  Skin: no rash      Data Reviewed: I have personally reviewed following labs and imaging studies  CBC:  Recent Labs Lab 05/09/17 1042 05/09/17 1515 05/12/17 0445  05/13/17 0458  WBC 9.6 8.6 8.5 9.2  NEUTROABS 8.2*  --   --   --   HGB 8.9* 8.4* 7.3* 10.9*  HCT 27.4* 25.8* 21.6* 31.9*  MCV 89.0 90.2 88.2 83.7  PLT 407* 432* 332 306   Basic Metabolic Panel:  Recent Labs Lab 05/09/17 2241 05/10/17 0413 05/11/17 0737 05/12/17 0445 05/13/17 0458  NA 142 140 139 139 140  K 2.8* 3.5 3.5 4.1 3.7  CL 112* 111 107 107 104  CO2 20* 20* 21* 24 28  GLUCOSE 137* 84 260* 350* 321*  BUN 46* 44* 38* 42* 46*  CREATININE 2.81* 2.77* 2.66* 2.68* 2.61*  CALCIUM 7.4* 7.7* 7.5* 7.2* 7.3*   GFR: Estimated Creatinine Clearance: 16.1 mL/min (A) (by C-G formula based on SCr of 2.61 mg/dL (H)). Liver Function Tests:  Recent Labs Lab 05/09/17 1042  AST 16  ALT 11*  ALKPHOS 108  BILITOT 0.8  PROT 5.4*  ALBUMIN 1.9*   No results for input(s): LIPASE, AMYLASE in the last 168 hours. No results for input(s): AMMONIA in the last 168 hours. Coagulation Profile: No results for input(s): INR, PROTIME in the last 168 hours. Cardiac Enzymes:  Recent Labs Lab 05/09/17 1042  TROPONINI <0.03   BNP (last 3 results) No results for input(s): PROBNP in the last 8760 hours. HbA1C: No results for input(s): HGBA1C in the last 72 hours. CBG:  Recent Labs Lab 05/12/17 1644 05/12/17 2246 05/13/17 0008 05/13/17 0746 05/13/17 1213  GLUCAP 192* 247* 259* 371* 283*   Lipid Profile: No results for input(s): CHOL, HDL, LDLCALC, TRIG, CHOLHDL, LDLDIRECT in the last 72 hours. Thyroid Function Tests: No results for input(s): TSH, T4TOTAL, FREET4, T3FREE, THYROIDAB in the last 72 hours. Anemia Panel:  Recent Labs  05/10/17 2019  TIBC 189*  IRON 38   Sepsis Labs: No results for input(s): PROCALCITON, LATICACIDVEN in the last 168 hours.  Recent Results (from the past 240 hour(s))  MRSA PCR Screening     Status: None   Collection Time: 05/09/17  3:00 PM  Result Value Ref Range Status   MRSA by PCR NEGATIVE NEGATIVE Final    Comment:        The GeneXpert  MRSA Assay (FDA approved for NASAL specimens only), is one component of a comprehensive MRSA colonization surveillance program. It is not intended to diagnose MRSA infection nor to guide or monitor treatment for MRSA infections.          Radiology Studies: No results found.      Scheduled Meds: . amLODipine  2.5 mg Oral Daily  . atorvastatin  10 mg Oral q1800  . carvedilol  3.125 mg Oral BID WC  . dextrose  1 ampule Intravenous Once  . escitalopram  15 mg Oral Daily  . feeding supplement (ENSURE  ENLIVE)  237 mL Oral TID BM  . furosemide  60 mg Oral BID  . gabapentin  100 mg Oral QHS  . heparin  5,000 Units Subcutaneous Q8H  . insulin aspart  0-9 Units Subcutaneous TID WC  . insulin glargine  6 Units Subcutaneous Daily  . levothyroxine  175 mcg Oral QAC breakfast  . metoCLOPramide  5 mg Oral TID AC  . pantoprazole (PROTONIX) IV  40 mg Intravenous Q12H  . sodium bicarbonate  650 mg Oral BID  . vitamin B-12  50 mcg Oral Daily   Continuous Infusions:    LOS: 4 days    Time spent: 35 minutes.     Alba Coryegalado, Kemaria Dedic A, MD [facility] Pager [phone]  If 7PM-7AM, please contact night-coverage [email or web address] Password [email or web address] 05/13/2017, 3:32 PM

## 2017-05-13 NOTE — Clinical Social Work Note (Signed)
Clinical Social Work Assessment  Patient Details  Name: Cassandra [name] MRN: [id number] Date of Birth: [birth year]  Date of referral:  05/13/17               Reason for consult:  Facility Placement                Permission sought to share information with:  Facility Industrial/product designerContact Representative Permission granted to share information::  Yes, Verbal Permission Granted  Name::        Agency::     Relationship::     Contact Information:     Housing/Transportation Living arrangements for the past 2 months:  Apartment Source of Information:  Patient, Spouse Patient Interpreter Needed:  None Criminal Activity/Legal Involvement Pertinent to Current Situation/Hospitalization:  No - Comment as needed Significant Relationships:  Spouse Lives with:  Spouse Do you feel safe going back to the place where you live?   (PT recommending SNF) Need for family participation in patient care:  Yes (Comment)  Care giving concerns:  Patient resides in home with husband. Patient experiencing weakness having issues ambulating. Patient's husband reported that he is unable to care for patient in the home.    Social Worker assessment / plan:  CSW spoke with patient/patient's husband at bedside regarding PT [facility] Patient alert and oriented x 4. Patient/patient's husband reported that they agreeable to SNF for ST rehab. CSW inquired about patient's legal guardian noted in chart on Epic, patient reported that she does not have a legal guardian and that daughter relinquished legal guardianship of the patient. Patient's husband expressed concern about costs associated with SNF. CSW validated patient's husbands feelings and concerns and explained insurance authorization process.  Patient's husband reported that he would like to transport patient to SNF if possible. CSW will complete FL2 and assist patient with discharge planning to SNF. PASRR pending must ID is [id number].   Employment status:   Retired Database administratornsurance information:  Managed Medicare PT Recommendations:  Skilled Nursing Facility Information / Referral to community resources:  Skilled Nursing Facility  Patient/Family's Response to care:  Patient/patient's husband [facility].  Patient/Family's Understanding of and Emotional Response to Diagnosis, Current Treatment, and Prognosis:  Patient verbalized understanding of plan and need for ST rehab at SNF. Patient reported that she has received wonderful care at the hospital but she feels about the same since she arrived. Patient/patient's husband reported that the goal is for patient to return home after ST rehab.   Emotional Assessment Appearance:  Appears stated age Attitude/Demeanor/Rapport:  Other (Cooperative) Affect (typically observed):  Appropriate Orientation:  Oriented to Self, Oriented to Place, Oriented to  Time, Oriented to Situation Alcohol / Substance use:  Not Applicable Psych involvement (Current and /or in the community):  No (Comment)  Discharge Needs  Concerns to be addressed:  Care Coordination Readmission within the last 30 days:  No Current discharge risk:  None Barriers to Discharge:  Continued Medical Work up   USG CorporationKimberly L Shamara Soza, LCSW 05/13/2017, 3:37 PM

## 2017-05-13 NOTE — NC FL2 (Signed)
[place] MEDICAID FL2 LEVEL OF CARE SCREENING TOOL     IDENTIFICATION  Patient Name: Cassandra [name] Birthdate: Dec 05, 1946 Sex: female Admission Date (Current Location): 05/09/2017  [facility] [place]   Facility and Address:  [facility],  [address], [place] [id number]      Provider Number: [id number]  Attending Physician Name and Address:  Alba Coryegalado, Belkys A, MD  Relative Name and Phone Number:       Current Level of Care: Hospital Recommended Level of Care: Skilled Nursing Facility Prior Approval Number:    Date Approved/Denied:   PASRR Number:    Discharge Plan: SNF    Current Diagnoses: Patient Active Problem List   Diagnosis Date Noted  . Anasarca 05/09/2017  . Nausea vomiting and diarrhea 03/08/2017  . Constipation   . Generalized abdominal pain   . Fecal impaction (HCC) 01/04/2017  . HAP (hospital-acquired pneumonia) 12/23/2016  . HCAP (healthcare-associated pneumonia)   . Diabetes mellitus with complication (HCC)   . Hypoglycemia 12/03/2016  . Palliative care encounter   . Goals of care, counseling/discussion   . Cervical vertebral fracture (HCC) 10/17/2016  . Thoracic vertebral fracture (HCC) 10/17/2016  . Fall at nursing home 10/17/2016  . Common bile duct dilatation 10/17/2016  . Closed fracture of cervical vertebra (HCC)   . Recent Gram-negative bacteremia 10/07/2016  . Hyperkalemia   . Gastroesophageal reflux disease   . DKA, type 1 (HCC) 09/06/2016  . Protein-calorie malnutrition, severe 08/28/2016  . AKI (acute kidney injury) (HCC) 07/19/2016  . Acute on chronic diastolic CHF (congestive heart failure) (HCC) 07/19/2016  . Dehydration   . Cerebral embolism with cerebral infarction 07/13/2016  . Left ventricular diastolic dysfunction, NYHA class 1 07/11/2016  . History of CVA (cerebrovascular accident)   . HLD (hyperlipidemia)   . Anemia of chronic disease   . Physical deconditioning   . Acute on  chronic kidney failure (HCC)   . Diabetic ketoacidosis with coma associated with type 1 diabetes mellitus (HCC)   . Cerebral thrombosis with cerebral infarction 05/15/2016  . Generalized anxiety disorder 05/10/2016  . Diabetic ketoacidosis without coma associated with type 1 diabetes mellitus (HCC) 05/06/2016  . Insulin dependent type 2 diabetes mellitus, uncontrolled (HCC)   . Depression   . CKD (chronic kidney disease) stage 3, GFR 30-59 ml/min   . Uncontrolled Hypothyroidism   . Vitamin B12 deficiency   . Benign paroxysmal positional vertigo   . Anxiety   . Allergic rhinitis   . Glaucoma   . Benign hypertension with CKD (chronic kidney disease) stage III   . Tobacco abuse   . Cervicalgia   . Elevated liver enzymes   . History of alcohol use     Orientation RESPIRATION BLADDER Height & Weight     Self, Time, Situation, Place  Normal Incontinent Weight: 124 lb 9 oz (56.5 kg) Height:  5\' 2"  (157.5 cm)  BEHAVIORAL SYMPTOMS/MOOD NEUROLOGICAL BOWEL NUTRITION STATUS      Incontinent Diet (carb modified )  AMBULATORY STATUS COMMUNICATION OF NEEDS Skin   Extensive Assist Verbally Normal                       Personal Care Assistance Level of Assistance  Bathing, Feeding, Dressing Bathing Assistance: Limited assistance Feeding assistance: Independent Dressing Assistance: Limited assistance     Functional Limitations Info  Sight, Hearing, Speech Sight Info: Impaired Hearing Info: Adequate Speech Info: Adequate    SPECIAL CARE FACTORS  FREQUENCY  PT (By licensed PT), OT (By licensed OT)     PT Frequency: 5x/week OT Frequency: 5x/week            Contractures Contractures Info: Not present    Additional Factors Info  Code Status, Allergies Code Status Info: Full Code Allergies Info: Tramadol,Augmentin Amoxicillin-pot Clavulanate,Ciprofloxacin,Alprazolam,Percocet Oxycodone-acetaminophen,Codeine,Doxycycline,Hydrocodone,Omnicef Cefdinir           Current  Medications (05/13/2017):  This is the current hospital active medication list Current Facility-Administered Medications  Medication Dose Route Frequency Provider Last Rate Last Dose  . acetaminophen (TYLENOL) tablet 650 mg  650 mg Oral Q6H PRN Regalado, Belkys A, MD      . amLODipine (NORVASC) tablet 2.5 mg  2.5 mg Oral Daily Regalado, Belkys A, MD   2.5 mg at 05/13/17 1039  . atorvastatin (LIPITOR) tablet 10 mg  10 mg Oral q1800 Maretta Bees, MD   10 mg at 05/12/17 1623  . carvedilol (COREG) tablet 3.125 mg  3.125 mg Oral BID WC Maretta Bees, MD   3.125 mg at 05/13/17 0830  . dextrose 50 % solution 50 mL  1 ampule Intravenous Once Maretta Bees, MD      . escitalopram (LEXAPRO) tablet 15 mg  15 mg Oral Daily Maretta Bees, MD   15 mg at 05/13/17 1038  . feeding supplement (ENSURE ENLIVE) (ENSURE ENLIVE) liquid 237 mL  237 mL Oral TID BM Regalado, Belkys A, MD   237 mL at 05/12/17 2017  . furosemide (LASIX) tablet 60 mg  60 mg Oral BID Regalado, Belkys A, MD   60 mg at 05/13/17 1038  . gabapentin (NEURONTIN) capsule 100 mg  100 mg Oral QHS Regalado, Belkys A, MD   100 mg at 05/12/17 2126  . heparin injection 5,000 Units  5,000 Units Subcutaneous Q8H Maretta Bees, MD   5,000 Units at 05/13/17 1516  . hydrALAZINE (APRESOLINE) injection 10 mg  10 mg Intravenous Q6H PRN Regalado, Belkys A, MD   10 mg at 05/10/17 1317  . insulin aspart (novoLOG) injection 0-9 Units  0-9 Units Subcutaneous TID WC Jinger Neighbors, NP   9 Units at 05/13/17 0831  . insulin glargine (LANTUS) injection 6 Units  6 Units Subcutaneous Daily Regalado, Belkys A, MD   6 Units at 05/13/17 1127  . levothyroxine (SYNTHROID, LEVOTHROID) tablet 175 mcg  175 mcg Oral QAC breakfast Maretta Bees, MD   175 mcg at 05/13/17 0830  . metoCLOPramide (REGLAN) tablet 5 mg  5 mg Oral TID AC Regalado, Belkys A, MD   5 mg at 05/13/17 1126  . ondansetron (ZOFRAN) injection 4 mg  4 mg Intravenous Q6H PRN Jinger Neighbors,  NP   4 mg at 05/11/17 2151  . pantoprazole (PROTONIX) injection 40 mg  40 mg Intravenous Q12H Regalado, Belkys A, MD   40 mg at 05/13/17 1039  . sodium bicarbonate tablet 650 mg  650 mg Oral BID Regalado, Belkys A, MD   650 mg at 05/13/17 1038  . vitamin B-12 (CYANOCOBALAMIN) tablet 50 mcg  50 mcg Oral Daily Regalado, Belkys A, MD   50 mcg at 05/13/17 1038     Discharge Medications: Please see discharge summary for a list of discharge medications.  Relevant Imaging Results:  Relevant Lab Results:   Additional Information ss: [id number]  Antionette Poles, LCSW

## 2017-05-13 NOTE — Progress Notes (Signed)
Physical Therapy Treatment Patient Details Name: Cassandra [name] MRN: [id number] DOB: 24-Jun-1946 Today's Date: 05/13/2017    History of Present Illness 71 yo female admitted with mild DKA, edema. Hx of bipolar d/o, CHF, CKD, DM, hypothyroidism, back sg, CVA, neuropathy    PT Comments    Pt remains weak and she c/o 9/10 bil LE pain on today. She was able to tolerate a stand pivot x2 with use of a RW. She was unable to tolerate ambulation due to leg pain. Discussed d/c plan again-pt appears more open to ST rehab on today. Will continue to follow and progress activity as tolerated.     Follow Up Recommendations  SNF     Equipment Recommendations  None recommended by PT    Recommendations for Other Services       Precautions / Restrictions Precautions Precautions: Fall Restrictions Weight Bearing Restrictions: No    Mobility  Bed Mobility Overal bed mobility: Needs Assistance Bed Mobility: Supine to Sit;Sit to Supine     Supine to sit: Min assist Sit to supine: Min guard   General bed mobility comments: Assist for trunk. Increased time. Pt relied on bedrail  Transfers Overall transfer level: Needs assistance Equipment used: Rolling walker (2 wheeled) Transfers: Sit to/from Stand Sit to Stand: Min assist Stand pivot transfers: Min assist       General transfer comment: Assist to rise, stabilize, control descent. VCs safety, hand placement. Stand pivot x2, bed<>bsc, with RW. Pt unable to tolerate ambulation on today due to leg pain.  Ambulation/Gait                 Stairs            Wheelchair Mobility    Modified Rankin (Stroke Patients Only)       Balance                                            Cognition Arousal/Alertness: Awake/alert Behavior During Therapy: WFL for tasks assessed/performed Overall Cognitive Status: Within Functional Limits for tasks assessed                                        Exercises      General Comments        Pertinent Vitals/Pain Pain Assessment: 0-10 Pain Score: 9  Pain Location: bil LEs Pain Descriptors / Indicators: Aching;Sore Pain Intervention(s): Limited activity within patient's tolerance;Repositioned;Patient requesting pain meds-RN notified    Home Living                      Prior Function            PT Goals (current goals can now be found in the care plan section) Progress towards PT goals: Progressing toward goals (slowly)    Frequency    Min 3X/week      PT Plan Discharge plan needs to be updated    Co-evaluation              AM-PAC PT "6 Clicks" Daily Activity  Outcome Measure  Difficulty turning over in bed (including adjusting bedclothes, sheets and blankets)?: A Little Difficulty moving from lying on back to sitting on the side of the bed? : A Little Difficulty sitting down on and standing  up from a chair with arms (e.g., wheelchair, bedside commode, etc,.)?: A Little Help needed moving to and from a bed to chair (including a wheelchair)?: A Little Help needed walking in hospital room?: A Little Help needed climbing 3-5 steps with a railing? : A Lot 6 Click Score: 17    End of Session   Activity Tolerance: Patient limited by pain;Patient limited by fatigue Patient left: in bed;with call bell/phone within reach;with bed alarm set   PT Visit Diagnosis: Muscle weakness (generalized) (M62.81);Difficulty in walking, not elsewhere classified (R26.2)     Time: 4098-1191 PT Time Calculation (min) (ACUTE ONLY): 13 min  Charges:  $Therapeutic Activity: 8-22 mins                    G Codes:          Rebeca Alert, MPT Pager: [phone]

## 2017-05-13 NOTE — Evaluation (Signed)
Occupational Therapy Evaluation Patient Details Name: Cassandra [name] MRN: [id number] DOB: May 14, 1946 Today's Date: 05/13/2017    History of Present Illness 71 yo female admitted with mild DKA, edema. Hx of bipolar d/o, CHF, CKD, DM, hypothyroidism, back sg, CVA, neuropathy   Clinical Impression   Pt admitted with mild DKA and edema. Pt currently with functional limitations due to the deficits listed below (see OT Problem List). Pt will benefit from skilled OT to increase their safety and independence with ADL and functional mobility for ADL to facilitate discharge to venue listed below.      Follow Up Recommendations  SNF - pt states her husband cant take care of her and she needs to be stronger to return home.    Equipment Recommendations  None recommended by OT    Recommendations for Other Services       Precautions / Restrictions Precautions Precautions: Fall Restrictions Weight Bearing Restrictions: No      Mobility Bed Mobility Overal bed mobility: Needs Assistance Bed Mobility: Supine to Sit;Sit to Supine     Supine to sit: Min assist Sit to supine: Min guard   General bed mobility comments: Assist for trunk. Increased time. Pt relied on bedrail  Transfers Overall transfer level: Needs assistance Equipment used: Rolling walker (2 wheeled) Transfers: Sit to/from [employer] Sit to Stand: Mod assist Stand pivot transfers: Mod assist       General transfer comment: Assist to rise, stabilize, control descent. VCs safety, hand placement. Stand pivot x2, bed<>bsc, with RW. Pt unable to tolerate ambulation on today due to leg pain.    Balance Overall balance assessment: Needs assistance           Standing balance-Leahy Scale: Poor                             ADL either performed or assessed with clinical judgement   ADL Overall ADL's : Needs assistance/impaired Eating/Feeding: Set up;Sitting   Grooming: Set up;Sitting    Upper Body Bathing: Set up;Sitting   Lower Body Bathing: Moderate assistance;Sit to/from stand;Cueing for safety;Cueing for sequencing   Upper Body Dressing : Set up;Sitting   Lower Body Dressing: Moderate assistance;Sit to/from stand;Cueing for safety;Cueing for sequencing   Toilet Transfer: Moderate assistance;BSC;RW;Stand-pivot;Cueing for sequencing;Cueing for safety   Toileting- Clothing Manipulation and Hygiene: Moderate assistance;Sit to/from stand;Cueing for safety;Cueing for sequencing               Vision Patient Visual Report: No change from baseline              Pertinent Vitals/Pain Pain Assessment: 0-10 Pain Score: 9  Pain Location: bil LEs Pain Descriptors / Indicators: Aching;Sore Pain Intervention(s): Limited activity within patient's tolerance;Monitored during session;Repositioned     Hand Dominance     Extremity/Trunk Assessment         Cervical / Trunk Assessment Cervical / Trunk Assessment: Kyphotic   Communication Communication Communication: No difficulties   Cognition Arousal/Alertness: Awake/alert Behavior During Therapy: WFL for tasks assessed/performed Overall Cognitive Status: Within Functional Limits for tasks assessed                                                Home Living Family/patient expects to be discharged to:: Private residence Living Arrangements: Spouse/significant other Available Help at Discharge:  Family;Available PRN/intermittently Type of Home: Apartment Home Access: Level entry     Home Layout: One level     Bathroom Shower/Tub: Chief Strategy OfficerTub/shower unit   Bathroom Toilet: Standard Bathroom Accessibility: Yes   Home Equipment: Wheelchair - [employer] Corporationmanual;Walker - 2 wheels;Shower seat          Prior Functioning/Environment Level of Independence: Needs assistance  Gait / Transfers Assistance Needed: uses RW              OT Problem List: Decreased strength;Decreased activity  tolerance;Pain;Impaired balance (sitting and/or standing);Decreased knowledge of use of DME or AE      OT Treatment/Interventions: Self-care/ADL training;Patient/family education;DME and/or AE instruction    OT Goals(Current goals can be found in the care plan section) Acute Rehab OT Goals Patient Stated Goal: home OT Goal Formulation: With patient Time For Goal Achievement: 05/27/17 Potential to Achieve Goals: Good  OT Frequency: Min 2X/week   Barriers to D/C:            Co-evaluation              AM-PAC PT "6 Clicks" Daily Activity     Outcome Measure Help from another person eating meals?: None Help from another person taking care of personal grooming?: A Little Help from another person toileting, which includes using toliet, bedpan, or urinal?: A Lot Help from another person bathing (including washing, rinsing, drying)?: A Lot Help from another person to put on and taking off regular upper body clothing?: A Little Help from another person to put on and taking off regular lower body clothing?: A Lot 6 Click Score: 16   End of Session Equipment Utilized During Treatment: Rolling walker Nurse Communication: Mobility status  Activity Tolerance: Patient limited by fatigue Patient left: in bed;with call bell/phone within reach;with bed alarm set  OT Visit Diagnosis: Unsteadiness on feet (R26.81);Muscle weakness (generalized) (M62.81);Pain Pain - Right/Left: Right Pain - part of body: Leg                Time: [id number] OT Time Calculation (min): 18 min Charges:  OT General Charges $OT Visit: 1 Procedure OT Evaluation $OT Eval Moderate Complexity: 1 Procedure G-Codes:     [name], OT [phone]  [name], [name] 05/13/2017, 12:57 PM

## 2017-05-14 DIAGNOSIS — I10 Essential (primary) hypertension: Secondary | ICD-10-CM

## 2017-05-14 DIAGNOSIS — N183 Chronic kidney disease, stage 3 (moderate): Secondary | ICD-10-CM

## 2017-05-14 LAB — GLUCOSE, CAPILLARY
GLUCOSE-CAPILLARY: 233 mg/dL — AB (ref 65–99)
GLUCOSE-CAPILLARY: 260 mg/dL — AB (ref 65–99)
GLUCOSE-CAPILLARY: 284 mg/dL — AB (ref 65–99)
GLUCOSE-CAPILLARY: 348 mg/dL — AB (ref 65–99)
Glucose-Capillary: 340 mg/dL — ABNORMAL HIGH (ref 65–99)

## 2017-05-14 MED ORDER — METOCLOPRAMIDE HCL 5 MG PO TABS: 5.0000 mg | ORAL_TABLET | Freq: Three times a day (TID) | ORAL | 0 refills | Status: DC

## 2017-05-14 MED ORDER — AMLODIPINE BESYLATE 5 MG PO TABS: 5.0000 mg | ORAL_TABLET | Freq: Every day | ORAL | 0 refills | Status: DC

## 2017-05-14 MED ORDER — POLYSACCHARIDE IRON COMPLEX 150 MG PO CAPS
150.0000 mg | ORAL_CAPSULE | Freq: Two times a day (BID) | ORAL | Status: DC
  Administered 2017-05-14: 150 mg via ORAL
  Filled 2017-05-14: qty 1

## 2017-05-14 MED ORDER — AMLODIPINE BESYLATE 5 MG PO TABS: 5.0000 mg | ORAL_TABLET | Freq: Every day | ORAL | Status: DC

## 2017-05-14 MED ORDER — LORAZEPAM 0.5 MG PO TABS
0.5000 mg | ORAL_TABLET | Freq: Once | ORAL | Status: AC
  Administered 2017-05-14: 0.5 mg via ORAL
  Filled 2017-05-14: qty 1

## 2017-05-14 MED ORDER — INSULIN GLARGINE 100 UNIT/ML SOLOSTAR PEN: 8.0000 [IU] | PEN_INJECTOR | Freq: Every day | SUBCUTANEOUS | 0 refills | Status: DC

## 2017-05-14 MED ORDER — CYANOCOBALAMIN 50 MCG PO TABS: 50.0000 ug | ORAL_TABLET | Freq: Every day | ORAL | 0 refills | Status: DC

## 2017-05-14 MED ORDER — FUROSEMIDE 20 MG PO TABS: 60.0000 mg | ORAL_TABLET | Freq: Every day | ORAL | 0 refills | Status: DC

## 2017-05-14 MED ORDER — GABAPENTIN 100 MG PO CAPS: 100.0000 mg | ORAL_CAPSULE | Freq: Three times a day (TID) | ORAL | 0 refills | Status: DC

## 2017-05-14 MED ORDER — INSULIN ASPART 100 UNIT/ML ~~LOC~~ SOLN
3.0000 [IU] | Freq: Once | SUBCUTANEOUS | Status: AC
  Administered 2017-05-14: 3 [IU] via SUBCUTANEOUS

## 2017-05-14 MED ORDER — AMLODIPINE BESYLATE 5 MG PO TABS
2.5000 mg | ORAL_TABLET | Freq: Once | ORAL | Status: AC
  Administered 2017-05-14: 2.5 mg via ORAL
  Filled 2017-05-14: qty 1

## 2017-05-14 NOTE — Progress Notes (Signed)
Physician Discharge Summary  Cassandra [name]  PIR:[id number]  DOB: 1946/11/03  DOA: 05/09/2017 PCP: Aura Dials, MD  Admit date: 05/09/2017 Discharge date: 05/14/2017  Admitted From: Home Disposition:    Recommendations for Outpatient Follow-up:  1. Follow up with PCP in 1-2 weeks 2. Please obtain BMP/CBC in one week 3. Please follow up on the following pending results:  Home Health: PT/RN/aide Equipment/Devices: None  Discharge Condition: Improved CODE STATUS: Full code  Diet recommendation: Heart Healthy / Carb Modified   Brief/Interim Summary: 71 year old female with medical history significant of labile type 1 diabetes mellitus, severe hypothyroidism, chronic kidney disease stage III, chronic diastolic heart failure presented to the emergency department for evaluation of facial swelling, lower extremity edema and weakness for 1 week prior to admission. Patient was found to be in DKA, anasarca acute on chronic kidney disease and was admitted for further treatment and evaluation.  Subjective: Patient seen and examined, she report feeling "bad". She stated that she developed a tremor overnight, which is visually intentional. Patient also report that she can't swallow, but she is ate breakfast and lunch with no complaints. Patient was advised to go to SNF for further recovery but patient refused. She wants to go home with her husband. No acute events overnight. Remains afebrile, diuresing well.   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  Anasarca  has resolved  Baseline Cr 1.8-2.2 - on admission 3 - improved with IVF  Treated with IVF and Bicarb  Initially treated with IV lasix - transitioned to PO lasix 60 mg  [place] negative for obstruction.  Need to follow up with [facility] in 1-2 months   Hypothyroidism; continue with synthroid, dose recently increased.  TSH 6.19 Check thyroid function in 6 weeks  Essential hypertension Patient was started on Norvasc 5 mg daily Follow-up with PCP  Acute on Chronic Diastolic HF;  Initially treated with IV lasix, transitioned to PO will continue 60 mg daily .  Chest x ray with small bilateral effusion.  Follow up with PCP in 1 week   History of vitamin B12 deficiency:  B12 - 339.  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 No further workup necessary   All other chronic medical condition were stable during the hospitalization.  Patient was seen by physical therapy, recommending SNF, although patient refused, will d/c home with home health PT/RN, patient report that her husband will take care of her  On the day of the discharge the patient's vitals were stable, and no other acute medical condition were reported by patient.  Discharge Instructions  You were cared for by a hospitalist during your hospital stay. If you have any questions about your discharge  medications or the care you received while you were in the hospital after you are discharged, you can call the unit and asked to speak with the hospitalist on call if the hospitalist that took care of you is not available. Once you are discharged, your primary care physician will handle any  further medical issues. Please note that NO REFILLS for any discharge medications will be authorized once you are discharged, as it is imperative that you return to your primary care physician (or establish a relationship with a primary care physician if you do not have one) for your aftercare needs so that they can reassess your need for medications and monitor your lab values.  Discharge Instructions    Call MD for:  difficulty breathing, headache or visual disturbances    Complete by:  As directed    Call MD for:  extreme fatigue    Complete by:  As directed    Call MD for:  hives    Complete by:  As directed    Call MD for:  persistant dizziness or light-headedness    Complete by:  As directed    Call MD for:  persistant nausea and vomiting    Complete by:  As directed    Call MD for:  redness, tenderness, or signs of infection (pain, swelling, redness, odor or green/yellow discharge around incision site)    Complete by:  As directed    Call MD for:  severe uncontrolled pain    Complete by:  As directed    Call MD for:  temperature >100.4    Complete by:  As directed    Diet - low sodium heart healthy    Complete by:  As directed    Increase activity slowly    Complete by:  As directed      Allergies as of 05/14/2017      Reactions   Alprazolam Other (See Comments)   Family preference, for patient to not take med   Percocet [oxycodone-acetaminophen] Other (See Comments)   Family preference, for patient to not take med   Codeine Diarrhea, Nausea And Vomiting   Doxycycline Diarrhea, Nausea And Vomiting   Hydrocodone Nausea And Vomiting   Omnicef [cefdinir] Nausea Only, Other (See Comments)   Constipation, tolerated Zosyn   Tramadol Other (See Comments)   Pt hallucinates when taking medication. Family request not to give medication to pt    Augmentin [amoxicillin-pot Clavulanate] Hives, Rash   Has patient had a PCN reaction causing immediate rash, facial/tongue/throat swelling,  SOB or lightheadedness with hypotension: Yes Has patient had a PCN reaction causing severe rash involving mucus membranes or skin necrosis: Yes Did a PCN reaction that required hospitalization No Did PCN reaction occurring within the last 10 years: Yes If all of the above answers are "NO", then may proceed with Cephalosporin use. Pt states she has taken penicillin since, and was ok with it...   Ciprofloxacin Hives   Tolerated LVQ in 12/2016      Medication List    STOP taking these medications   ibuprofen 200 MG tablet Commonly known as:  ADVIL,MOTRIN     TAKE these medications   amLODipine 5 MG tablet Commonly known as:  NORVASC Take 1 tablet (5 mg total) by mouth daily. Start taking on:  05/15/2017   aspirin-acetaminophen-caffeine 250-250-65 MG tablet Commonly known as:  EXCEDRIN MIGRAINE Take 1 tablet by mouth every 6 (six) hours as needed for headache.   atorvastatin 10 MG tablet Commonly known  as:  LIPITOR Take 1 tablet (10 mg total) by mouth daily at 6 PM. What changed:  when to take this   carvedilol 3.125 MG tablet Commonly known as:  COREG Take 1 tablet (3.125 mg total) by mouth 2 (two) times daily with a meal.   cyanocobalamin 50 MCG tablet Take 1 tablet (50 mcg total) by mouth daily. Start taking on:  05/15/2017   escitalopram 10 MG tablet Commonly known as:  LEXAPRO Take 1.5 tablets (15 mg total) by mouth daily. What changed:  how much to take  when to take this   furosemide 20 MG tablet Commonly known as:  LASIX Take 3 tablets (60 mg total) by mouth daily. What changed:  how much to take  when to take this   gabapentin 100 MG capsule Commonly known as:  NEURONTIN Take 1 capsule (100 mg total) by mouth 3 (three) times daily.   glucose monitoring kit monitoring kit 1 each by Does not apply route 4 (four) times daily - after meals and at bedtime. 1 month Diabetic Testing Supplies for QAC-QHS accuchecks.   Insulin Glargine 100 UNIT/ML Solostar  Pen Commonly known as:  LANTUS Inject 8 Units into the skin daily at 10 pm. What changed:  how much to take   insulin lispro 100 UNIT/ML injection Commonly known as:  HUMALOG Inject 0.03 mLs (3 Units total) into the skin 3 (three) times daily with meals. What changed:  how much to take   Insulin Pen Needle 32G X 8 MM Misc Use as directed   iron polysaccharides 150 MG capsule Commonly known as:  NIFEREX Take 150 mg by mouth 2 (two) times daily.   levothyroxine 175 MCG tablet Commonly known as:  SYNTHROID, LEVOTHROID Take 1 tablet (175 mcg total) by mouth daily before breakfast.   metoCLOPramide 5 MG tablet Commonly known as:  REGLAN Take 1 tablet (5 mg total) by mouth 3 (three) times daily before meals.   ondansetron 4 MG tablet Commonly known as:  ZOFRAN Take 4 mg by mouth every 8 (eight) hours as needed for nausea or vomiting.   senna-docusate 8.6-50 MG tablet Commonly known as:  Senokot-S Take 2 tablets by mouth 2 (two) times daily.       Allergies  Allergen Reactions  . Alprazolam Other (See Comments)    Family preference, for patient to not take med  . Percocet [Oxycodone-Acetaminophen] Other (See Comments)    Family preference, for patient to not take med  . Codeine Diarrhea and Nausea And Vomiting  . Doxycycline Diarrhea and Nausea And Vomiting  . Hydrocodone Nausea And Vomiting  . Omnicef [Cefdinir] Nausea Only and Other (See Comments)    Constipation, tolerated Zosyn  . Tramadol Other (See Comments)    Pt hallucinates when taking medication. Family request not to give medication to pt   . Augmentin [Amoxicillin-Pot Clavulanate] Hives and Rash    Has patient had a PCN reaction causing immediate rash, facial/tongue/throat swelling, SOB or lightheadedness with hypotension: Yes Has patient had a PCN reaction causing severe rash involving mucus membranes or skin necrosis: Yes Did a PCN reaction that required hospitalization No Did PCN reaction occurring within  the last 10 years: Yes If all of the above answers are "NO", then may proceed with Cephalosporin use.  Pt states she has taken penicillin since, and was ok with it...  . Ciprofloxacin Hives    Tolerated LVQ in 12/2016    Consultations:  Nephrology   Procedures/Studies: Dg Chest 2 View  Result Date: 05/09/2017 CLINICAL DATA:  Edema to lower extremity over last several days and awoke with new edema to bilat hands and face with eyes almost swollen shut, some cough, no other chest complaints EXAM: CHEST - 2 VIEW COMPARISON:  01/07/2017 and previous FINDINGS: Small bilateral pleural effusions with adjacent atelectasis/ consolidation posteriorly in the lower lobes.Lungs are otherwise clear. Heart size and mediastinal contours are within normal limits. No pneumothorax. Cervical fixation hardware partially visualized. Surgical clips in the upper abdomen. IMPRESSION: Small bilateral pleural effusions, new since prior study Electronically Signed   By: Lucrezia Europe M.D.   On: 05/09/2017 11:22   Dg Abd 1 View  Result Date: 05/11/2017 CLINICAL DATA:  Abdominal distension. EXAM: ABDOMEN - 1 VIEW COMPARISON:  None. FINDINGS: The bowel gas pattern is normal. No radio-opaque calculi or other significant radiographic abnormality are seen. IMPRESSION: Negative. Electronically Signed   By: Dorise Bullion III M.D   On: 05/11/2017 09:29   Ct Head Wo Contrast  Result Date: 05/09/2017 CLINICAL DATA:  Blurred vision, headaches, altered mental status. EXAM: CT HEAD WITHOUT CONTRAST TECHNIQUE: Contiguous axial images were obtained from the base of the skull through the vertex without intravenous contrast. COMPARISON:  12/11/2016 FINDINGS: Brain: There is atrophy and chronic small vessel disease changes. No acute intracranial abnormality. Specifically, no hemorrhage, hydrocephalus, mass lesion, acute infarction, or significant intracranial injury. Vascular: No hyperdense vessel or unexpected calcification. Skull: No acute  calvarial abnormality. Sinuses/Orbits: Visualized paranasal sinuses and mastoids clear. Orbital soft tissues unremarkable. Other: None IMPRESSION: No acute intracranial abnormality. Atrophy, chronic microvascular disease. Electronically Signed   By: Rolm Baptise M.D.   On: 05/09/2017 11:11   US Renal  Result Date: 05/10/2017 CLINICAL DATA:  Chronic renal failure EXAM: RENAL / URINARY TRACT ULTRASOUND COMPLETE COMPARISON:  01/04/2017 FINDINGS: Right Kidney: Length: 9.9 cm. Mild increased echogenicity is noted without obstructive change. Left Kidney: Length: 9.6 cm.  Mild increased echogenicity is noted. Bladder: Decompressed by Foley catheter. Small left pleural effusion is seen as well as mild ascites. IMPRESSION: Increased echogenicity consistent with medical renal disease. Mild ascites and left pleural effusion. Electronically Signed   By: Inez Catalina M.D.   On: 05/10/2017 16:18     Discharge Exam: Vitals:   05/14/17 0843 05/14/17 1300  BP: (!) 173/78 139/61  Pulse: 81 83  Resp: 14 18  Temp: 98.5 F (36.9 C) 99.2 F (37.3 C)   Vitals:   05/14/17 0559 05/14/17 0715 05/14/17 0843 05/14/17 1300  BP: (!) 210/88 (!) 166/76 (!) 173/78 139/61  Pulse: 79 80 81 83  Resp: _0 Temp: 98.8 F (37.1 C)  98.5 F (36.9 C) 99.2 F (37.3 C)  TempSrc: Oral  Oral Oral  SpO2: 91%  94% 96%  Weight: 55.8 kg (123 lb 0.3 oz)     Height:        General: Pt is alert, awake, not in acute distress, patient shake her neck and head while speaking Cardiovascular: RRR, S1/S2 +, no rubs, no gallops Respiratory: CTA bilaterally, no wheezing, no rhonchi Abdominal: Soft, NT, ND, bowel sounds + Extremities: Trace edema bilaterally, no cyanosis  The results of significant diagnostics from this hospitalization (including imaging, microbiology, ancillary and laboratory) are listed below for reference.     Microbiology: Recent Results (from the past 240 hour(s))  MRSA PCR Screening     Status: None    Collection Time: 05/09/17  3:00 PM  Result Value Ref Range Status   MRSA  by PCR NEGATIVE NEGATIVE Final    Comment:        The GeneXpert MRSA Assay (FDA approved for NASAL specimens only), is one component of a comprehensive MRSA colonization surveillance program. It is not intended to diagnose MRSA infection nor to guide or monitor treatment for MRSA infections.      Labs: BNP (last 3 results)  Recent Labs  10/16/16 1055 03/10/17 0521 05/09/17 1042  BNP 277.7* 620.7* 235.3*   Basic Metabolic Panel:  Recent Labs Lab 05/09/17 2241 05/10/17 0413 05/11/17 0737 05/12/17 0445 05/13/17 0458  NA 142 140 139 139 140  K 2.8* 3.5 3.5 4.1 3.7  CL 112* 111 107 107 104  CO2 20* 20* 21* 24 28  GLUCOSE 137* 84 260* 350* 321*  BUN 46* 44* 38* 42* 46*  CREATININE 2.81* 2.77* 2.66* 2.68* 2.61*  CALCIUM 7.4* 7.7* 7.5* 7.2* 7.3*   Liver Function Tests:  Recent Labs Lab 05/09/17 1042  AST 16  ALT 11*  ALKPHOS 108  BILITOT 0.8  PROT 5.4*  ALBUMIN 1.9*   No results for input(s): LIPASE, AMYLASE in the last 168 hours. No results for input(s): AMMONIA in the last 168 hours. CBC:  Recent Labs Lab 05/09/17 1042 05/09/17 1515 05/12/17 0445 05/13/17 0458  WBC 9.6 8.6 8.5 9.2  NEUTROABS 8.2*  --   --   --   HGB 8.9* 8.4* 7.3* 10.9*  HCT 27.4* 25.8* 21.6* 31.9*  MCV 89.0 90.2 88.2 83.7  PLT 407* 432* 332 306   Cardiac Enzymes:  Recent Labs Lab 05/09/17 1042  TROPONINI <0.03   BNP: Invalid input(s): POCBNP CBG:  Recent Labs Lab 05/13/17 2142 05/14/17 0016 05/14/17 0810 05/14/17 0845 05/14/17 1128  GLUCAP 348* 348* 233* 284* 340*   D-Dimer No results for input(s): DDIMER in the last 72 hours. Hgb A1c No results for input(s): HGBA1C in the last 72 hours. Lipid Profile No results for input(s): CHOL, HDL, LDLCALC, TRIG, CHOLHDL, LDLDIRECT in the last 72 hours. Thyroid function studies No results for input(s): TSH, T4TOTAL, T3FREE, THYROIDAB in the last  72 hours.  Invalid input(s): FREET3 Anemia work up No results for input(s): VITAMINB12, FOLATE, FERRITIN, TIBC, IRON, RETICCTPCT in the last 72 hours. Urinalysis    Component Value Date/Time   COLORURINE YELLOW 05/09/2017 1042   APPEARANCEUR CLOUDY (A) 05/09/2017 1042   APPEARANCEUR Clear 06/15/2014 1508   LABSPEC 1.018 05/09/2017 1042   LABSPEC 1.010 06/15/2014 1508   PHURINE 5.0 05/09/2017 1042   GLUCOSEU >=500 (A) 05/09/2017 1042   GLUCOSEU Negative 06/15/2014 1508   HGBUR SMALL (A) 05/09/2017 1042   BILIRUBINUR NEGATIVE 05/09/2017 1042   BILIRUBINUR Negative 06/15/2014 1508   KETONESUR 20 (A) 05/09/2017 1042   PROTEINUR >=300 (A) 05/09/2017 1042   UROBILINOGEN 0.2 09/14/2015 1617   NITRITE NEGATIVE 05/09/2017 1042   LEUKOCYTESUR SMALL (A) 05/09/2017 1042   LEUKOCYTESUR Negative 06/15/2014 1508   Sepsis Labs Invalid input(s): PROCALCITONIN,  WBC,  LACTICIDVEN Microbiology Recent Results (from the past 240 hour(s))  MRSA PCR Screening     Status: None   Collection Time: 05/09/17  3:00 PM  Result Value Ref Range Status   MRSA by PCR NEGATIVE NEGATIVE Final    Comment:        The GeneXpert MRSA Assay (FDA approved for NASAL specimens only), is one component of a comprehensive MRSA colonization surveillance program. It is not intended to diagnose MRSA infection nor to guide or monitor treatment for MRSA infections.  Time coordinating discharge: 35 minutes  SIGNED:  Chipper Oman, MD  [facility] 05/14/2017, 4:10 PM  Pager please text page via  [email or web address] Password [email or web address]

## 2017-05-14 NOTE — Progress Notes (Signed)
Pt states "I think I've had a stroke". States she cannot swallow solid foods. Neuro checks done and all within normal limits. VS stable. CBG taken and elevated. Does have some facial tremors. She is able to swallow water without choking or coughing. MD notified on rounds and orders obtained. Melton Alarana A Aja Bolander, RN

## 2017-05-14 NOTE — Evaluation (Signed)
Clinical/Bedside Swallow Evaluation Patient Details  Name: Cassandra [name] MRN: [id number] Date of Birth: Jul 15, 1946  Today's Date: 05/14/2017 Time: SLP Start Time (ACUTE ONLY): 1351 SLP Stop Time (ACUTE ONLY): 1406 SLP Time Calculation (min) (ACUTE ONLY): 15 min  Past Medical History:  Past Medical History:  Diagnosis Date  . Allergic rhinitis   . Anemia   . Anxiety   . Arthritis    "mostly feet, hands" (10/17/2016)  . Benign hypertension with CKD (chronic kidney disease) stage III   . Benign paroxysmal positional vertigo   . Bipolar disorder (HCC)   . Broken finger   . Broken shoulder   . Broken toes   . Cervicalgia   . CHF (congestive heart failure) (HCC) 07/2016  . CKD (chronic kidney disease) stage 3, GFR 30-59 ml/min   . COPD (chronic obstructive pulmonary disease) (HCC)   . Depression   . Elevated liver enzymes Hep B/C neg 2014  . Fall at nursing home 10/15/2016  . GERD (gastroesophageal reflux disease)   . Glaucoma   . High cholesterol   . History of alcohol use   . History of blood transfusion 09/2016   "blood got really really low"  . Hypertension   . Hypothyroidism   . Interstitial cystitis    bladder stretched every 9 months  . Migraines    "pretty much qd" (10/17/2016)  . Pneumonia 12/2016  . Psoriasis   . Stroke (HCC) 05/16/2016   Left occipital and thalamic, right hippocampal  . Thyroid disease   . Tobacco use   . 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 Vitamin B12 deficiency    Past Surgical History:  Past Surgical History:  Procedure Laterality Date  . ANTERIOR CERVICAL DECOMP/DISCECTOMY FUSION  ~ 2009  . APPENDECTOMY    . BACK SURGERY    . BLADDER SUSPENSION    . CATARACT EXTRACTION W/ INTRAOCULAR LENS  IMPLANT, BILATERAL Bilateral   . DILATION AND CURETTAGE OF UTERUS    . HERNIA REPAIR    . LAPAROSCOPIC CHOLECYSTECTOMY    . TONSILLECTOMY    . TUBAL LIGATION    . VAGINAL HYSTERECTOMY     with oophorectomy   HPI:  71  yo female admitted with mild DKA, edema. Hx of bipolar d/o, CHF, CKD, DM, hypothyroidism, back sg, CVA, neuropathy, pna, GERD. 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 BSE 10/04/16 limited, pt declined solids (esophragm same day), recommended regular texture, thin liquids. CXR 5/25 Small bilateral pleural effusions, new since prior study.    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 Intake with thin and regular were unremarkable for oropharyngeal dysfunction. Per RN pt stated "she could not swallow when she woke up and had facial twitching". Py may have been experiencing symptoms of GER from laying supine during the night. SLP reiterated and discussed reflux precautions. Continue regular texture and thin. No further ST needed.  SLP Visit Diagnosis: Dysphagia, unspecified (R13.10)    Aspiration Risk   (mild-mod)    Diet Recommendation Regular;Thin liquid   Liquid Administration  via: Cup;Straw Medication Administration: Whole meds with liquid Supervision: Patient able to self feed Compensations: Slow rate;Small sips/bites Postural Changes: Seated upright at 90 degrees;Remain upright for at least 30 minutes after po intake    Other  Recommendations Oral Care Recommendations: Oral care BID   Follow up Recommendations None      Frequency and Duration            Prognosis        Swallow Study   General  HPI: 71 yo female admitted with mild DKA, edema. Hx of bipolar d/o, CHF, CKD, DM, hypothyroidism, back sg, CVA, neuropathy, pna, GERD. 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 BSE 10/04/16 limited, pt declined solids (esophragm same day), recommended regular texture, thin liquids. CXR 5/25 Small bilateral pleural effusions, new since prior study.  Type of Study: Bedside Swallow Evaluation Previous Swallow Assessment:  (see HPI) Diet Prior to this Study: Regular;Thin liquids Temperature Spikes Noted: Yes Respiratory Status: Room air History of Recent Intubation: No Behavior/Cognition: Alert;Cooperative;Requires cueing Oral Cavity Assessment: Within Functional Limits Oral Care Completed by SLP: No Oral Cavity - Dentition: Adequate natural dentition Vision: Functional for self-feeding Self-Feeding Abilities: Able to feed self Patient Positioning: Upright in bed Baseline Vocal Quality: Normal Volitional Cough: Strong Volitional Swallow: Able to elicit    Oral/Motor/Sensory Function Overall Oral Motor/Sensory Function: Within functional limits   Ice Chips Ice chips: Not tested   Thin Liquid Thin Liquid: Within functional limits Presentation: Cup;Straw    Nectar Thick Nectar Thick Liquid: Not tested   Honey Thick Honey Thick Liquid: Not tested   Puree Puree: Not tested   Solid   GO   Solid: Within functional limits        Roque CashLitaker, Breck CoonsLisa Willis 05/14/2017,3:57 PM  Breck CoonsLisa Willis MartinsvilleLitaker M.Ed [employer]SLP Pager [phone]

## 2017-05-14 NOTE — Discharge Summary (Signed)
$'[]'p$ Hide copied text Physician Discharge Summary  Cassandra [name]  VWU:[id number]  DOB: Feb 09, 1946  DOA: 05/09/2017 PCP: Aura Dials, MD  Admit date: 05/09/2017 Discharge date: 05/14/2017  Admitted From: Home Disposition:    Recommendations for Outpatient Follow-up:  1. Follow up with PCP in 1-2 weeks 2. Please obtain BMP/CBC in one week 3. Please follow up on the following pending results:  Home Health: PT/RN/aide Equipment/Devices: None  Discharge Condition: Improved CODE STATUS: Full code  Diet recommendation: Heart Healthy / Carb Modified   Brief/Interim Summary: 71 year old female with medical history significant of labile type 1 diabetes mellitus, severe hypothyroidism, chronic kidney disease stage III, chronic diastolic heart failure presented to the emergency department for evaluation of facial swelling, lower extremity edema and weakness for 1 week prior to admission. Patient was found to be in DKA, anasarca acute on chronic kidney disease and was admitted for further treatment and evaluation.  Subjective: Patient seen and examined, she report feeling "bad". She stated that she developed a tremor overnight, which is visually intentional. Patient also report that she can't swallow, but she is ate breakfast and lunch with no complaints. Patient was advised to go to SNF for further recovery but patient refused. She wants to go home with her husband. No acute events overnight. Remains afebrile, diuresing well.   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  Anasarca has resolved  Baseline Cr 1.8-2.2 - on admission 3 - improved with IVF  Treated with IVF and Bicarb  Initially treated with IV lasix - transitioned to PO lasix 60 mg  [place] negative for obstruction.  Need to follow up with [facility] in 1-2 months   Hypothyroidism; continue with synthroid, dose recently increased.  TSH 6.19 Check thyroid function in 6 weeks  Essential hypertension Patient was started on Norvasc 5 mg daily Follow-up with PCP  Acute on Chronic Diastolic HF;  Initially treated with IV lasix, transitioned to PO will continue 60 mg daily .  Chest x ray with small bilateral effusion.  Follow up with PCP in 1 week   History of vitamin B12 deficiency: B12 - 339.  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 No further workup necessary   All other chronic medical condition were stable during the hospitalization.  Patient was seen by physical therapy, recommending SNF, although patient refused, will d/c home with home health PT/RN, patient report that her husband will take care of her  On the day of the discharge the patient's vitals were stable, and no other acute medical condition were reported by patient.  Discharge Instructions  You were cared for by a hospitalist during your hospital stay. If you have any questions about  your discharge medications or the care you received while you were in the hospital after you are discharged, you can call the unit and asked to speak with the hospitalist on call if the hospitalist that took care of you is not available. Once you are discharged, your primary  care physician will handle any further medical issues. Please note that NO REFILLS for any discharge medications will be authorized once you are discharged, as it is imperative that you return to your primary care physician (or establish a relationship with a primary care physician if you do not have one) for your aftercare needs so that they can reassess your need for medications and monitor your lab values.      Discharge Instructions    Call MD for:  difficulty breathing, headache or visual disturbances    Complete by:  As directed    Call MD for:  extreme fatigue    Complete by:  As directed    Call MD for:  hives    Complete by:  As directed    Call MD for:  persistant dizziness or light-headedness    Complete by:  As directed    Call MD for:  persistant nausea and vomiting    Complete by:  As directed    Call MD for:  redness, tenderness, or signs of infection (pain, swelling, redness, odor or green/yellow discharge around incision site)    Complete by:  As directed    Call MD for:  severe uncontrolled pain    Complete by:  As directed    Call MD for:  temperature >100.4    Complete by:  As directed    Diet - low sodium heart healthy    Complete by:  As directed    Increase activity slowly    Complete by:  As directed          Allergies as of 05/14/2017      Reactions   Alprazolam Other (See Comments)   Family preference, for patient to not take med   Percocet [oxycodone-acetaminophen] Other (See Comments)   Family preference, for patient to not take med   Codeine Diarrhea, Nausea And Vomiting   Doxycycline Diarrhea, Nausea And Vomiting   Hydrocodone Nausea And Vomiting   Omnicef [cefdinir] Nausea Only, Other (See Comments)   Constipation, tolerated Zosyn   Tramadol Other (See Comments)   Pt hallucinates when taking medication. Family request not to give medication to pt    Augmentin [amoxicillin-pot Clavulanate] Hives, Rash   Has  patient had a PCN reaction causing immediate rash, facial/tongue/throat swelling, SOB or lightheadedness with hypotension: Yes Has patient had a PCN reaction causing severe rash involving mucus membranes or skin necrosis: Yes Did a PCN reaction that required hospitalization No Did PCN reaction occurring within the last 10 years: Yes If all of the above answers are "NO", then may proceed with Cephalosporin use. Pt states she has taken penicillin since, and was ok with it...   Ciprofloxacin Hives   Tolerated LVQ in 12/2016         Medication List    STOP taking these medications   ibuprofen 200 MG tablet Commonly known as:  ADVIL,MOTRIN     TAKE these medications   amLODipine 5 MG tablet Commonly known as:  NORVASC Take 1 tablet (5 mg total) by mouth daily. Start taking on:  05/15/2017   aspirin-acetaminophen-caffeine 250-250-65 MG tablet Commonly known as:  EXCEDRIN MIGRAINE Take 1 tablet by mouth every 6 (six)  hours as needed for headache.   atorvastatin 10 MG tablet Commonly known as:  LIPITOR Take 1 tablet (10 mg total) by mouth daily at 6 PM. What changed:  when to take this   carvedilol 3.125 MG tablet Commonly known as:  COREG Take 1 tablet (3.125 mg total) by mouth 2 (two) times daily with a meal.   cyanocobalamin 50 MCG tablet Take 1 tablet (50 mcg total) by mouth daily. Start taking on:  05/15/2017   escitalopram 10 MG tablet Commonly known as:  LEXAPRO Take 1.5 tablets (15 mg total) by mouth daily. What changed:  how much to take  when to take this   furosemide 20 MG tablet Commonly known as:  LASIX Take 3 tablets (60 mg total) by mouth daily. What changed:  how much to take  when to take this   gabapentin 100 MG capsule Commonly known as:  NEURONTIN Take 1 capsule (100 mg total) by mouth 3 (three) times daily.   glucose monitoring kit monitoring kit 1 each by Does not apply route 4 (four) times daily - after meals and at bedtime. 1  month Diabetic Testing Supplies for QAC-QHS accuchecks.   Insulin Glargine 100 UNIT/ML Solostar Pen Commonly known as:  LANTUS Inject 8 Units into the skin daily at 10 pm. What changed:  how much to take   insulin lispro 100 UNIT/ML injection Commonly known as:  HUMALOG Inject 0.03 mLs (3 Units total) into the skin 3 (three) times daily with meals. What changed:  how much to take   Insulin Pen Needle 32G X 8 MM Misc Use as directed   iron polysaccharides 150 MG capsule Commonly known as:  NIFEREX Take 150 mg by mouth 2 (two) times daily.   levothyroxine 175 MCG tablet Commonly known as:  SYNTHROID, LEVOTHROID Take 1 tablet (175 mcg total) by mouth daily before breakfast.   metoCLOPramide 5 MG tablet Commonly known as:  REGLAN Take 1 tablet (5 mg total) by mouth 3 (three) times daily before meals.   ondansetron 4 MG tablet Commonly known as:  ZOFRAN Take 4 mg by mouth every 8 (eight) hours as needed for nausea or vomiting.   senna-docusate 8.6-50 MG tablet Commonly known as:  Senokot-S Take 2 tablets by mouth 2 (two) times daily.           Allergies  Allergen Reactions  . Alprazolam Other (See Comments)    Family preference, for patient to not take med  . Percocet [Oxycodone-Acetaminophen] Other (See Comments)    Family preference, for patient to not take med  . Codeine Diarrhea and Nausea And Vomiting  . Doxycycline Diarrhea and Nausea And Vomiting  . Hydrocodone Nausea And Vomiting  . Omnicef [Cefdinir] Nausea Only and Other (See Comments)    Constipation, tolerated Zosyn  . Tramadol Other (See Comments)    Pt hallucinates when taking medication. Family request not to give medication to pt   . Augmentin [Amoxicillin-Pot Clavulanate] Hives and Rash    Has patient had a PCN reaction causing immediate rash, facial/tongue/throat swelling, SOB or lightheadedness with hypotension: Yes Has patient had a PCN reaction causing severe rash involving  mucus membranes or skin necrosis: Yes Did a PCN reaction that required hospitalization No Did PCN reaction occurring within the last 10 years: Yes If all of the above answers are "NO", then may proceed with Cephalosporin use.  Pt states she has taken penicillin since, and was ok with it...  . Ciprofloxacin Hives  Tolerated LVQ in 12/2016    Consultations:  Nephrology   Procedures/Studies: Imaging Results  Dg Chest 2 View  Result Date: 05/09/2017 CLINICAL DATA:  Edema to lower extremity over last several days and awoke with new edema to bilat hands and face with eyes almost swollen shut, some cough, no other chest complaints EXAM: CHEST - 2 VIEW COMPARISON:  01/07/2017 and previous FINDINGS: Small bilateral pleural effusions with adjacent atelectasis/ consolidation posteriorly in the lower lobes.Lungs are otherwise clear. Heart size and mediastinal contours are within normal limits. No pneumothorax. Cervical fixation hardware partially visualized. Surgical clips in the upper abdomen. IMPRESSION: Small bilateral pleural effusions, new since prior study Electronically Signed   By: Lucrezia Europe M.D.   On: 05/09/2017 11:22   Dg Abd 1 View  Result Date: 05/11/2017 CLINICAL DATA:  Abdominal distension. EXAM: ABDOMEN - 1 VIEW COMPARISON:  None. FINDINGS: The bowel gas pattern is normal. No radio-opaque calculi or other significant radiographic abnormality are seen. IMPRESSION: Negative. Electronically Signed   By: Dorise Bullion III M.D   On: 05/11/2017 09:29   Ct Head Wo Contrast  Result Date: 05/09/2017 CLINICAL DATA:  Blurred vision, headaches, altered mental status. EXAM: CT HEAD WITHOUT CONTRAST TECHNIQUE: Contiguous axial images were obtained from the base of the skull through the vertex without intravenous contrast. COMPARISON:  12/11/2016 FINDINGS: Brain: There is atrophy and chronic small vessel disease changes. No acute intracranial abnormality. Specifically, no hemorrhage,  hydrocephalus, mass lesion, acute infarction, or significant intracranial injury. Vascular: No hyperdense vessel or unexpected calcification. Skull: No acute calvarial abnormality. Sinuses/Orbits: Visualized paranasal sinuses and mastoids clear. Orbital soft tissues unremarkable. Other: None IMPRESSION: No acute intracranial abnormality. Atrophy, chronic microvascular disease. Electronically Signed   By: Rolm Baptise M.D.   On: 05/09/2017 11:11   US Renal  Result Date: 05/10/2017 CLINICAL DATA:  Chronic renal failure EXAM: RENAL / URINARY TRACT ULTRASOUND COMPLETE COMPARISON:  01/04/2017 FINDINGS: Right Kidney: Length: 9.9 cm. Mild increased echogenicity is noted without obstructive change. Left Kidney: Length: 9.6 cm.  Mild increased echogenicity is noted. Bladder: Decompressed by Foley catheter. Small left pleural effusion is seen as well as mild ascites. IMPRESSION: Increased echogenicity consistent with medical renal disease. Mild ascites and left pleural effusion. Electronically Signed   By: Inez Catalina M.D.   On: 05/10/2017 16:18       Discharge Exam:     Vitals:   05/14/17 0843 05/14/17 1300  BP: (!) 173/78 139/61  Pulse: 81 83  Resp: 14 18  Temp: 98.5 F (36.9 C) 99.2 F (37.3 C)         Vitals:   05/14/17 0559 05/14/17 0715 05/14/17 0843 05/14/17 1300  BP: (!) 210/88 (!) 166/76 (!) 173/78 139/61  Pulse: 79 80 81 83  Resp: '19  14 18  '$ Temp: 98.8 F (37.1 C)  98.5 F (36.9 C) 99.2 F (37.3 C)  TempSrc: Oral  Oral Oral  SpO2: 91%  94% 96%  Weight: 55.8 kg (123 lb 0.3 oz)     Height:        General: Pt is alert, awake, not in acute distress, patient shake her neck and head while speaking Cardiovascular: RRR, S1/S2 +, no rubs, no gallops Respiratory: CTA bilaterally, no wheezing, no rhonchi Abdominal: Soft, NT, ND, bowel sounds + Extremities: Trace edema bilaterally, no cyanosis   The results of significant diagnostics from this hospitalization  (including imaging, microbiology, ancillary and laboratory) are listed below for reference.     Microbiology:  Recent Results (from the past 240 hour(s))  MRSA PCR Screening     Status: None   Collection Time: 05/09/17  3:00 PM  Result Value Ref Range Status   MRSA by PCR NEGATIVE NEGATIVE Final    Comment:        The GeneXpert MRSA Assay (FDA approved for NASAL specimens only), is one component of a comprehensive MRSA colonization surveillance program. It is not intended to diagnose MRSA infection nor to guide or monitor treatment for MRSA infections.      Labs: BNP (last 3 results)  Recent Labs (within last 365 days)   Recent Labs  10/16/16 1055 03/10/17 0521 05/09/17 1042  BNP 277.7* 620.7* 578.6*     Basic Metabolic Panel:  Last Labs    Recent Labs Lab 05/09/17 2241 05/10/17 0413 05/11/17 0737 05/12/17 0445 05/13/17 0458  NA 142 140 139 139 140  K 2.8* 3.5 3.5 4.1 3.7  CL 112* 111 107 107 104  CO2 20* 20* 21* 24 28  GLUCOSE 137* 84 260* 350* 321*  BUN 46* 44* 38* 42* 46*  CREATININE 2.81* 2.77* 2.66* 2.68* 2.61*  CALCIUM 7.4* 7.7* 7.5* 7.2* 7.3*     Liver Function Tests:  Last Labs    Recent Labs Lab 05/09/17 1042  AST 16  ALT 11*  ALKPHOS 108  BILITOT 0.8  PROT 5.4*  ALBUMIN 1.9*     Last Labs   No results for input(s): LIPASE, AMYLASE in the last 168 hours.   Last Labs   No results for input(s): AMMONIA in the last 168 hours.   CBC:  Last Labs    Recent Labs Lab 05/09/17 1042 05/09/17 1515 05/12/17 0445 05/13/17 0458  WBC 9.6 8.6 8.5 9.2  NEUTROABS 8.2*  --   --   --   HGB 8.9* 8.4* 7.3* 10.9*  HCT 27.4* 25.8* 21.6* 31.9*  MCV 89.0 90.2 88.2 83.7  PLT 407* 432* 332 306     Cardiac Enzymes:  Last Labs    Recent Labs Lab 05/09/17 1042  TROPONINI <0.03     BNP: Last Labs   Invalid input(s): POCBNP   CBG:  Last Labs    Recent Labs Lab 05/13/17 2142 05/14/17 0016  05/14/17 0810 05/14/17 0845 05/14/17 1128  GLUCAP 348* 348* 233* 284* 340*     D-Dimer Recent Labs (last 2 labs)   No results for input(s): DDIMER in the last 72 hours.   Hgb A1c Recent Labs (last 2 labs)   No results for input(s): HGBA1C in the last 72 hours.   Lipid Profile Recent Labs (last 2 labs)   No results for input(s): CHOL, HDL, LDLCALC, TRIG, CHOLHDL, LDLDIRECT in the last 72 hours.   Thyroid function studies  Recent Labs (last 2 labs)   No results for input(s): TSH, T4TOTAL, T3FREE, THYROIDAB in the last 72 hours.  Invalid input(s): FREET3   Anemia work up [employer] (last 2 labs)   No results for input(s): VITAMINB12, FOLATE, FERRITIN, TIBC, IRON, RETICCTPCT in the last 72 hours.   Urinalysis Labs (Brief)     Component Value Date/Time   COLORURINE YELLOW 05/09/2017 1042   APPEARANCEUR CLOUDY (A) 05/09/2017 1042   APPEARANCEUR Clear 06/15/2014 1508   LABSPEC 1.018 05/09/2017 1042   LABSPEC 1.010 06/15/2014 1508   PHURINE 5.0 05/09/2017 1042   GLUCOSEU >=500 (A) 05/09/2017 1042   GLUCOSEU Negative 06/15/2014 1508   HGBUR SMALL (A) 05/09/2017 1042   BILIRUBINUR NEGATIVE 05/09/2017 1042  BILIRUBINUR Negative 06/15/2014 1508   KETONESUR 20 (A) 05/09/2017 1042   PROTEINUR >=300 (A) 05/09/2017 1042   UROBILINOGEN 0.2 09/14/2015 1617   NITRITE NEGATIVE 05/09/2017 1042   LEUKOCYTESUR SMALL (A) 05/09/2017 1042   LEUKOCYTESUR Negative 06/15/2014 1508     Sepsis Labs Last Labs   Invalid input(s): PROCALCITONIN,  WBC,  LACTICIDVEN   Microbiology        Recent Results (from the past 240 hour(s))  MRSA PCR Screening     Status: None   Collection Time: 05/09/17  3:00 PM  Result Value Ref Range Status   MRSA by PCR NEGATIVE NEGATIVE Final    Comment:        The GeneXpert MRSA Assay (FDA approved for NASAL specimens only), is one component of a comprehensive MRSA colonization surveillance program. It is not intended to  diagnose MRSA infection nor to guide or monitor treatment for MRSA infections.     Time coordinating discharge: 35 minutes  SIGNED:  Chipper Oman, MD           [facility] 05/14/2017, 4:10 PM  Pager please text page via  [email or web address] Password [email or web address]

## 2017-05-14 NOTE — Progress Notes (Signed)
Patient discharged with husband via private car. Condition stable and unchanged. Melton Alarana A Jervis Trapani, RN

## 2017-05-14 NOTE — Progress Notes (Signed)
CSW provided patient with bed offers, patient reported that [facility] and that she will be going home. RNCM aware. CSW signing off, no other needs identified at this time. Please consult if new needs arise.   Celso SickleKimberly Derrall Hicks, [place] Clinical Social Worker [facility] Cell#: [phone]

## 2017-05-14 NOTE — Progress Notes (Signed)
Spoke with pt and husband concerning discharge. Pt plan to discharge home with [facility].  Referral given to in house rep.

## 2017-05-14 NOTE — Progress Notes (Signed)
Physical Therapy Treatment Patient Details Name: Cassandra [name] MRN: [id number] DOB: [birth year] Today's Date: 05/14/2017    History of Present Illness 71 yo female admitted with mild DKA, edema. Hx of bipolar d/o, CHF, CKD, DM, hypothyroidism, back sg, CVA, neuropathy    PT Comments    Pt required MAX encouragement to get OOB just to amb to bathroom.  Very unsteady, slow, groggy, sloppy gait with poor self use of walker.  General Gait Details: Very unsteady, slow, sluggish gait with poor forward flex posture and delayed corrective reaction.  HIGH FALL RISK.  Required 75% VC's on proper walker to self distance and safety with turns.    Follow Up Recommendations  SNF (per RN pt declines due to a previous experience.  Pt will need 24/7 assist at home with all. )     Equipment Recommendations       Recommendations for Other Services       Precautions / Restrictions Precautions Precautions: Fall Restrictions Weight Bearing Restrictions: No    Mobility  Bed Mobility Overal bed mobility: Needs Assistance Bed Mobility: Supine to Sit;Sit to Supine     Supine to sit: Min guard;Supervision Sit to supine: Supervision;Min guard   General bed mobility comments: HOB and use of rail, pt was able to self rise with increased time and MAX encouragement.  Pt present with multiple issues, (dizzy/weak/tremors)  Transfers Overall transfer level: Needs assistance Equipment used: Rolling walker (2 wheeled) Transfers: Sit to/from [employer] Sit to Stand: Min guard;Min assist Stand pivot transfers: Min guard;Min assist       General transfer comment: close Min/MinGuard for safety.  Pt very unsteady, slow, sluggish, sloppy.  75% VC's on safety with turns and proper walker use as well as correct hand placement/transition from sit to stand and stand to sit.  Ambulation/Gait Ambulation/Gait assistance: Min assist Ambulation Distance (Feet): 16 Feet (8 feet x 2 to and from  bathroom only was all pt agreed to do) Assistive device: Rolling walker (2 wheeled) Gait Pattern/deviations: Decreased stride length;Decreased step length - right;Decreased step length - left;Trunk flexed Gait velocity: decreased   General Gait Details: Very unsteady, slow, sluggish gait with poor forward flex posture and delayed corrective reaction.  HIGH FALL RISK.  Required 75% VC's on proper walker to self distance and safety with turns.     Stairs            Wheelchair Mobility    Modified Rankin (Stroke Patients Only)       Balance                                            Cognition Arousal/Alertness: Awake/alert Behavior During Therapy: WFL for tasks assessed/performed Overall Cognitive Status: Within Functional Limits for tasks assessed                                 General Comments: low motivation and quick to say "I can't"      Exercises      General Comments        Pertinent Vitals/Pain Pain Assessment: No/denies pain    Home Living                      Prior Function  PT Goals (current goals can now be found in the care plan section) Progress towards PT goals: Progressing toward goals    Frequency    Min 3X/week      PT Plan Current plan remains appropriate    Co-evaluation              AM-PAC PT "6 Clicks" Daily Activity  Outcome Measure  Difficulty turning over in bed (including adjusting bedclothes, sheets and blankets)?: A Little Difficulty moving from lying on back to sitting on the side of the bed? : A Little Difficulty sitting down on and standing up from a chair with arms (e.g., wheelchair, bedside commode, etc,.)?: A Little Help needed moving to and from a bed to chair (including a wheelchair)?: A Little Help needed walking in hospital room?: A Little Help needed climbing 3-5 steps with a railing? : A Lot 6 Click Score: 17    End of Session Equipment Utilized  During Treatment: Gait belt Activity Tolerance: Other (comment);Patient limited by fatigue Patient left: in bed;with call bell/phone within reach;with bed alarm set Nurse Communication: Mobility status PT Visit Diagnosis: Muscle weakness (generalized) (M62.81);Difficulty in walking, not elsewhere classified (R26.2)     Time: [id number] PT Time Calculation (min) (ACUTE ONLY): 16 min  Charges:  $Gait Training: 8-22 mins                    G Codes:       {Reese Senk  PTA [facility]  Acute  Rehab Pager      [phone]

## 2017-05-14 NOTE — Progress Notes (Signed)
Occupational Therapy Treatment Patient Details Name: Cassandra [name] MRN: [id number] DOB: 1946-03-23 Today's Date: 05/14/2017    History of present illness 71 yo female admitted with mild DKA, edema. Hx of bipolar d/o, CHF, CKD, DM, hypothyroidism, back sg, CVA, neuropathy   OT comments  Pt needed much encouragement to sit EOB. Pt refused to stand. RN aware  Follow Up Recommendations  SNF;Home health OT;Supervision/Assistance - 24 hour;Other (comment) (pt now states she wants to go home and husband care for her.)    Equipment Recommendations  None recommended by OT       Precautions / Restrictions Precautions Precautions: Fall       Mobility Bed Mobility Overal bed mobility: Needs Assistance Bed Mobility: Supine to Sit;Sit to Supine     Supine to sit: Min assist Sit to supine: Supervision      Transfers       Sit to Stand: Mod assist Stand pivot transfers: Mod assist       General transfer comment: refused to stand    Balance Overall balance assessment: Needs assistance Sitting-balance support: No upper extremity supported;Feet supported Sitting balance-Leahy Scale: Good                                     ADL either performed or assessed with clinical judgement   ADL Overall ADL's : Needs assistance/impaired     Grooming: Set up;Sitting   Upper Body Bathing: Set up;Sitting   Lower Body Bathing: Sitting/lateral leans;Moderate assistance   Upper Body Dressing : Set up;Sitting   Lower Body Dressing: Moderate assistance;Sitting/lateral leans                 General ADL Comments: pt refused to stand. Educated pt to lean L and R for bathing and dressing if she was feeling weak at home.  Pt has decided to go home and have husband A her.       Vision Patient Visual Report: No change from baseline            Cognition Arousal/Alertness: Awake/alert Behavior During Therapy: WFL for tasks assessed/performed Overall Cognitive  Status: Within Functional Limits for tasks assessed                                                General Comments      Pertinent Vitals/ Pain       Pain Score: 5  Pain Location: head Pain Descriptors / Indicators: Discomfort;Dull Pain Intervention(s): Limited activity within patient's tolerance;Premedicated before session;Patient requesting pain meds-RN notified         Frequency  Min 2X/week        Progress Toward Goals  OT Goals(current goals can now be found in the care plan section)  Progress towards OT goals: Progressing toward goals     Plan Discharge plan needs to be updated    Co-evaluation                 AM-PAC PT "6 Clicks" Daily Activity     Outcome Measure   Help from another person eating meals?: None Help from another person taking care of personal grooming?: A Little Help from another person toileting, which includes using toliet, bedpan, or urinal?: A Lot Help from another person bathing (including washing, rinsing,  drying)?: A Lot Help from another person to put on and taking off regular upper body clothing?: A Little Help from another person to put on and taking off regular lower body clothing?: A Lot 6 Click Score: 16    End of Session    OT Visit Diagnosis: Unsteadiness on feet (R26.81);Muscle weakness (generalized) (M62.81);Pain Pain - Right/Left: Right Pain - part of body: Leg   Activity Tolerance Patient limited by fatigue;No increased pain   Patient Left in bed;with call bell/phone within reach;with bed alarm set   Nurse Communication Mobility status        Time: [id number] OT Time Calculation (min): 26 min  Charges: OT General Charges $OT Visit: 1 Procedure OT Treatments $Self Care/Home Management : 23-37 mins  AllouezLori Winefred [name], [place] [phone]   [name], Cassandra [name] D 05/14/2017, 11:38 AM

## 2017-05-15 ENCOUNTER — Encounter (HOSPITAL_COMMUNITY): Payer: Self-pay | Admitting: Emergency Medicine

## 2017-05-15 ENCOUNTER — Emergency Department (HOSPITAL_COMMUNITY): Payer: Medicare Other

## 2017-05-15 ENCOUNTER — Emergency Department (HOSPITAL_COMMUNITY)
Admission: EM | Admit: 2017-05-15 | Discharge: 2017-05-15 | Disposition: A | Discharge status: Home / Self Care | Payer: Medicare Other | Attending: Emergency Medicine | Admitting: Emergency Medicine

## 2017-05-15 DIAGNOSIS — Z87891 Personal history of nicotine dependence: Secondary | ICD-10-CM | POA: Diagnosis not present

## 2017-05-15 DIAGNOSIS — Z794 Long term (current) use of insulin: Secondary | ICD-10-CM | POA: Diagnosis not present

## 2017-05-15 DIAGNOSIS — N3000 Acute cystitis without hematuria: Secondary | ICD-10-CM | POA: Diagnosis not present

## 2017-05-15 DIAGNOSIS — K5909 Other constipation: Secondary | ICD-10-CM | POA: Insufficient documentation

## 2017-05-15 DIAGNOSIS — I5033 Acute on chronic diastolic (congestive) heart failure: Secondary | ICD-10-CM | POA: Insufficient documentation

## 2017-05-15 DIAGNOSIS — E1011 Type 1 diabetes mellitus with ketoacidosis with coma: Secondary | ICD-10-CM | POA: Insufficient documentation

## 2017-05-15 DIAGNOSIS — N183 Chronic kidney disease, stage 3 unspecified: Secondary | ICD-10-CM

## 2017-05-15 DIAGNOSIS — E1022 Type 1 diabetes mellitus with diabetic chronic kidney disease: Secondary | ICD-10-CM | POA: Insufficient documentation

## 2017-05-15 DIAGNOSIS — E039 Hypothyroidism, unspecified: Secondary | ICD-10-CM | POA: Insufficient documentation

## 2017-05-15 DIAGNOSIS — Z8673 Personal history of transient ischemic attack (TIA), and cerebral infarction without residual deficits: Secondary | ICD-10-CM | POA: Insufficient documentation

## 2017-05-15 DIAGNOSIS — R3915 Urgency of urination: Secondary | ICD-10-CM | POA: Diagnosis present

## 2017-05-15 DIAGNOSIS — Z7982 Long term (current) use of aspirin: Secondary | ICD-10-CM | POA: Diagnosis not present

## 2017-05-15 DIAGNOSIS — R601 Generalized edema: Secondary | ICD-10-CM | POA: Insufficient documentation

## 2017-05-15 DIAGNOSIS — R251 Tremor, unspecified: Secondary | ICD-10-CM | POA: Diagnosis not present

## 2017-05-15 DIAGNOSIS — Z79899 Other long term (current) drug therapy: Secondary | ICD-10-CM | POA: Diagnosis not present

## 2017-05-15 DIAGNOSIS — I130 Hypertensive heart and chronic kidney disease with heart failure and stage 1 through stage 4 chronic kidney disease, or unspecified chronic kidney disease: Secondary | ICD-10-CM | POA: Diagnosis not present

## 2017-05-15 DIAGNOSIS — J449 Chronic obstructive pulmonary disease, unspecified: Secondary | ICD-10-CM | POA: Insufficient documentation

## 2017-05-15 LAB — COMPREHENSIVE METABOLIC PANEL
ALK PHOS: 79 U/L (ref 38–126)
ALT: 12 U/L — AB (ref 14–54)
AST: 18 U/L (ref 15–41)
Albumin: 1.8 g/dL — ABNORMAL LOW (ref 3.5–5.0)
Anion gap: 11 (ref 5–15)
BUN: 48 mg/dL — AB (ref 6–20)
CALCIUM: 7.7 mg/dL — AB (ref 8.9–10.3)
CHLORIDE: 102 mmol/L (ref 101–111)
CO2: 25 mmol/L (ref 22–32)
CREATININE: 2.56 mg/dL — AB (ref 0.44–1.00)
GFR, EST AFRICAN AMERICAN: 21 mL/min — AB (ref >60)
GFR, EST NON AFRICAN AMERICAN: 18 mL/min — AB (ref >60)
Glucose, Bld: 308 mg/dL — ABNORMAL HIGH (ref 65–99)
Potassium: 3.9 mmol/L (ref 3.5–5.1)
SODIUM: 138 mmol/L (ref 135–145)
Total Bilirubin: 0.5 mg/dL (ref 0.3–1.2)
Total Protein: 5.2 g/dL — ABNORMAL LOW (ref 6.5–8.1)

## 2017-05-15 LAB — CBC WITH DIFFERENTIAL/PLATELET
Basophils Absolute: 0 10*3/uL (ref 0.0–0.1)
Basophils Relative: 0 %
Eosinophils Absolute: 0.2 10*3/uL (ref 0.0–0.7)
Eosinophils Relative: 2 %
HCT: 35.2 % — ABNORMAL LOW (ref 36.0–46.0)
Hemoglobin: 11.8 g/dL — ABNORMAL LOW (ref 12.0–15.0)
LYMPHS ABS: 2 10*3/uL (ref 0.7–4.0)
LYMPHS PCT: 17 %
MCH: 29 pg (ref 26.0–34.0)
MCHC: 33.5 g/dL (ref 30.0–36.0)
MCV: 86.5 fL (ref 78.0–100.0)
Monocytes Absolute: 0.6 10*3/uL (ref 0.1–1.0)
Monocytes Relative: 5 %
NEUTROS PCT: 76 %
Neutro Abs: 9 10*3/uL — ABNORMAL HIGH (ref 1.7–7.7)
PLATELETS: 279 10*3/uL (ref 150–400)
RBC: 4.07 MIL/uL (ref 3.87–5.11)
RDW: 15.8 % — ABNORMAL HIGH (ref 11.5–15.5)
WBC: 11.8 10*3/uL — AB (ref 4.0–10.5)

## 2017-05-15 LAB — URINALYSIS, ROUTINE W REFLEX MICROSCOPIC
Bilirubin Urine: NEGATIVE
Glucose, UA: >=500 mg/dL — AB
Hgb urine dipstick: NEGATIVE
Ketones, ur: 5 mg/dL — AB
NITRITE: NEGATIVE
Protein, ur: >=300 mg/dL — AB
SPECIFIC GRAVITY, URINE: 1.012 (ref 1.005–1.030)
Squamous Epithelial / LPF: NONE SEEN
pH: 6 (ref 5.0–8.0)

## 2017-05-15 LAB — CBG MONITORING, ED: GLUCOSE-CAPILLARY: 336 mg/dL — AB (ref 65–99)

## 2017-05-15 LAB — BRAIN NATRIURETIC PEPTIDE: B NATRIURETIC PEPTIDE 5: 330.7 pg/mL — AB (ref 0.0–100.0)

## 2017-05-15 LAB — I-STAT TROPONIN, ED: TROPONIN I, POC: 0.01 ng/mL (ref 0.00–0.08)

## 2017-05-15 MED ORDER — NITROFURANTOIN MONOHYD MACRO 100 MG PO CAPS
100.0000 mg | ORAL_CAPSULE | Freq: Once | ORAL | Status: AC
  Administered 2017-05-15: 100 mg via ORAL
  Filled 2017-05-15: qty 1

## 2017-05-15 MED ORDER — POLYETHYLENE GLYCOL 3350 17 G PO PACK: 17.0000 g | PACK | Freq: Two times a day (BID) | ORAL | 0 refills | Status: AC

## 2017-05-15 MED ORDER — IOPAMIDOL (ISOVUE-300) INJECTION 61%
30.0000 mL | Freq: Once | INTRAVENOUS | Status: AC | PRN
  Administered 2017-05-15: 30 mL via ORAL

## 2017-05-15 MED ORDER — IOPAMIDOL (ISOVUE-300) INJECTION 61%
INTRAVENOUS | Status: AC
  Filled 2017-05-15: qty 30

## 2017-05-15 MED ORDER — NITROFURANTOIN MONOHYD MACRO 100 MG PO CAPS
100.0000 mg | ORAL_CAPSULE | Freq: Two times a day (BID) | ORAL | 0 refills | Status: AC
Start: 2017-05-15 — End: 2017-05-20

## 2017-05-15 NOTE — ED Notes (Signed)
Bed: AV40WA10 Expected date:  Expected time:  Means of arrival:  Comments: Tremors, evaluated yesterday for the same

## 2017-05-15 NOTE — ED Provider Notes (Signed)
[facility] DEPT Provider Note   CSN: [id number] Arrival date & time: 05/15/17  0732     History   Chief Complaint Chief Complaint  Patient presents with  . Tremors  . Headache    HPI Cassandra [name] is a 71 y.o. female.  HPI 71 year old female with an extensive past medical history including hypertension, type 1 diabetes, [facility] ED, CHF, prior stroke who presents to the ED with tremors and peripheral edema. Patient also reports inability to void. She reports that her tremors and her inability to void began this morning. She reports that she awoke approximately 4 5:00 this morning trying to go to the bathroom and was unable. She reports having urgency and urinating on herself. Since then she has been unable to void. She reports recent admission with Foley catheter in place. She does endorse abdominal distention. Denies any fevers, chills, abdominal pain. Denies any aggravating or alleviating factors.  Patient was recently admitted at which time she was diuresed and anasarca was noted to have resolved. Subsequently patient was IV hydrated as well or renal insufficiency. Patient was also given pRBC for anemia. She was discharged yesterday and reports that the swelling began at that time.  Past Medical History:  Diagnosis Date  . Allergic rhinitis   . Anemia   . Anxiety   . Arthritis    "mostly feet, hands" (10/17/2016)  . Benign hypertension with CKD (chronic kidney disease) stage III   . Benign paroxysmal positional vertigo   . Bipolar disorder ([place])   . Broken finger   . Broken shoulder   . Broken toes   . Cervicalgia   . CHF (congestive heart failure) ([place]) 07/2016  . CKD (chronic kidney disease) stage 3, GFR 30-59 ml/min   . COPD (chronic obstructive pulmonary disease) ([place])   . Depression   . Elevated liver enzymes Hep B/C neg 2014  . Fall at nursing home 10/15/2016  . GERD (gastroesophageal reflux disease)   . Glaucoma   . High cholesterol   . History of alcohol use    . History of blood transfusion 09/2016   "blood got really really low"  . Hypertension   . Hypothyroidism   . Interstitial cystitis    bladder stretched every 9 months  . Migraines    "pretty much qd" (10/17/2016)  . Pneumonia 12/2016  . Psoriasis   . Stroke ([place]) 05/16/2016   Left occipital and thalamic, right hippocampal  . Thyroid disease   . Tobacco use   . 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 Vitamin B12 deficiency     Patient Active Problem List   Diagnosis Date Noted  . Anasarca 05/09/2017  . Nausea vomiting and diarrhea 03/08/2017  . Constipation   . Generalized abdominal pain   . Fecal impaction ([place]) 01/04/2017  . HAP (hospital-acquired pneumonia) 12/23/2016  . HCAP (healthcare-associated pneumonia)   . Diabetes mellitus with complication ([place])   . Hypoglycemia 12/03/2016  . Palliative care encounter   . Goals of care, counseling/discussion   . Cervical vertebral fracture ([place]) 10/17/2016  . Thoracic vertebral fracture ([place]) 10/17/2016  . Fall at nursing home 10/17/2016  . Common bile duct dilatation 10/17/2016  . Closed fracture of cervical vertebra ([place])   . Recent Gram-negative bacteremia 10/07/2016  . Hyperkalemia   . Gastroesophageal reflux disease   . DKA, type 1 ([place]) 09/06/2016  . Protein-calorie malnutrition, severe 08/28/2016  . AKI (acute kidney injury) ([place]) 07/19/2016  . Acute on chronic  diastolic CHF (congestive heart failure) ([place]) 07/19/2016  . Dehydration   . Cerebral embolism with cerebral infarction 07/13/2016  . Left ventricular diastolic dysfunction, NYHA class 1 07/11/2016  . History of CVA (cerebrovascular accident)   . HLD (hyperlipidemia)   . Anemia of chronic disease   . Physical deconditioning   . Acute on chronic kidney failure ([place])   . Diabetic ketoacidosis with coma associated with type 1 diabetes mellitus ([place])   . Cerebral thrombosis with cerebral infarction 05/15/2016  . Generalized anxiety  disorder 05/10/2016  . Diabetic ketoacidosis without coma associated with type 1 diabetes mellitus ([place]) 05/06/2016  . Insulin dependent type 2 diabetes mellitus, uncontrolled ([place])   . Depression   . CKD (chronic kidney disease) stage 3, GFR 30-59 ml/min   . Uncontrolled Hypothyroidism   . Vitamin B12 deficiency   . Benign paroxysmal positional vertigo   . Anxiety   . Allergic rhinitis   . Glaucoma   . Benign hypertension with CKD (chronic kidney disease) stage III   . Tobacco abuse   . Cervicalgia   . Elevated liver enzymes   . History of alcohol use     Past Surgical History:  Procedure Laterality Date  . ANTERIOR CERVICAL DECOMP/DISCECTOMY FUSION  ~ 2009  . APPENDECTOMY    . BACK SURGERY    . BLADDER SUSPENSION    . CATARACT EXTRACTION W/ INTRAOCULAR LENS  IMPLANT, BILATERAL Bilateral   . DILATION AND CURETTAGE OF UTERUS    . HERNIA REPAIR    . LAPAROSCOPIC CHOLECYSTECTOMY    . TONSILLECTOMY    . TUBAL LIGATION    . VAGINAL HYSTERECTOMY     with oophorectomy    OB History    No data available       Home Medications    Prior to Admission medications   Medication Sig Start Date End Date Taking? Authorizing Provider  amLODipine (NORVASC) 5 MG tablet Take 1 tablet (5 mg total) by mouth daily. 05/15/17  Yes Patrecia Pour, Christean Grief, MD  aspirin-acetaminophen-caffeine (EXCEDRIN MIGRAINE) [phone] MG tablet Take 1 tablet by mouth every 6 (six) hours as needed for headache.   Yes [provider]  atorvastatin (LIPITOR) 10 MG tablet Take 1 tablet (10 mg total) by mouth daily at 6 PM. Patient taking differently: Take 10 mg by mouth every morning.  12/27/16  Yes Ghimire, Henreitta Leber, MD  carvedilol (COREG) 3.125 MG tablet Take 1 tablet (3.125 mg total) by mouth 2 (two) times daily with a meal. 12/27/16  Yes Ghimire, Henreitta Leber, MD  escitalopram (LEXAPRO) 10 MG tablet Take 1.5 tablets (15 mg total) by mouth daily. Patient taking differently: Take 20 mg by mouth every  morning.  12/27/16  Yes Ghimire, Henreitta Leber, MD  furosemide (LASIX) 20 MG tablet Take 3 tablets (60 mg total) by mouth daily. 05/14/17  Yes Patrecia Pour, Christean Grief, MD  glucose monitoring kit (FREESTYLE) monitoring kit 1 each by Does not apply route 4 (four) times daily - after meals and at bedtime. 1 month Diabetic Testing Supplies for QAC-QHS accuchecks. 12/27/16  Yes Ghimire, Henreitta Leber, MD  Insulin Glargine (LANTUS) 100 UNIT/ML Solostar Pen Inject 8 Units into the skin daily at 10 pm. 05/14/17  Yes Patrecia Pour, Christean Grief, MD  insulin lispro (HUMALOG) 100 UNIT/ML injection Inject 0.03 mLs (3 Units total) into the skin 3 (three) times daily with meals. Patient taking differently: Inject 5-7 Units into the skin 3 (three) times daily as needed for high blood sugar.  03/12/17  Yes Nita Sells, MD  Insulin Pen Needle 32G X 8 MM MISC Use as directed 12/27/16  Yes Ghimire, Henreitta Leber, MD  iron polysaccharides (NIFEREX) 150 MG capsule Take 150 mg by mouth 2 (two) times daily.   Yes [provider]  levothyroxine (SYNTHROID, LEVOTHROID) 175 MCG tablet Take 1 tablet (175 mcg total) by mouth daily before breakfast. 01/07/17  Yes Sudini, Srikar, MD  ondansetron (ZOFRAN) 4 MG tablet Take 4 mg by mouth every 8 (eight) hours as needed for nausea or vomiting.   Yes [provider]  senna-docusate (SENOKOT-S) 8.6-50 MG tablet Take 2 tablets by mouth 2 (two) times daily. Patient taking differently: Take 2 tablets by mouth daily as needed for mild constipation or moderate constipation.  01/07/17  Yes Sudini, Alveta Heimlich, MD  Cyanocobalamin (VITAMIN B-12) 50 MCG tablet Take 1 tablet (50 mcg total) by mouth daily. Patient not taking: Reported on 05/15/2017 05/15/17   Patrecia Pour, Christean Grief, MD  gabapentin (NEURONTIN) 100 MG capsule Take 1 capsule (100 mg total) by mouth 3 (three) times daily. 05/14/17   Doreatha Lew, MD  metoCLOPramide (REGLAN) 5 MG tablet Take 1 tablet (5 mg total) by mouth 3 (three) times daily  before meals. 05/14/17   Doreatha Lew, MD  nitrofurantoin, macrocrystal-monohydrate, (MACROBID) 100 MG capsule Take 1 capsule (100 mg total) by mouth 2 (two) times daily. 05/15/17 05/20/17  Fatima Blank, MD  polyethylene glycol [facility] / Floria Raveling) packet Take 17 g by mouth 2 (two) times daily. 05/15/17 05/29/17  Fatima Blank, MD    Family History Family History  Problem Relation Age of Onset  . Alcohol abuse Mother   . Arthritis Mother   . Asthma Mother   . Cancer Mother        colon cancer  . Hypertension Mother   . Migraines Mother   . Stroke Mother   . Lung disease Mother   . COPD Mother   . Diabetes Father   . Hypertension Father   . Heart disease Father   . Heart attack Father   . Heart disease Paternal Grandmother   . Diabetes Paternal Grandmother   . Stroke Paternal Grandmother   . Cancer Paternal Grandmother   . Diabetes Paternal Grandfather     Social History Social History  Substance Use Topics  . Smoking status: Former Smoker    Packs/day: 0.25    Years: 10.00    Types: Cigarettes    Quit date: 12/1923  . Smokeless tobacco: Never Used     Comment: QUIT SMOKING 11/2016  . Alcohol use No     Comment: Hasn't had any alcohol "for 2 yrs" (10/17/2016)     Allergies   Alprazolam; Percocet [oxycodone-acetaminophen]; Codeine; Doxycycline; Hydrocodone; Omnicef [cefdinir]; Tramadol; Augmentin [amoxicillin-pot clavulanate]; and Ciprofloxacin   Review of Systems Review of Systems  Neurological: Positive for headaches (chronic).   All other systems are reviewed and are negative for acute change except as noted in the HPI   Physical Exam Updated Vital Signs BP (!) 176/87 (BP Location: Right Arm)   Pulse 73   Temp 98 F (36.7 C) (Oral)   Resp 20   SpO2 98%   Physical Exam  Constitutional: She is oriented to person, place, and time. She appears well-developed and well-nourished. No distress.  HENT:  Head: Normocephalic and atraumatic.    Nose: Nose normal.  Eyes: Conjunctivae and EOM are normal. Pupils are equal, round, and reactive to light. Right eye exhibits no discharge. Left  eye exhibits no discharge. No scleral icterus.  Neck: Normal range of motion. Neck supple.  Cardiovascular: Normal rate and regular rhythm.  Exam reveals no gallop and no friction rub.   No murmur heard. Pulmonary/Chest: Effort normal and breath sounds normal. No stridor. No respiratory distress. She has no rales.  Abdominal: Soft. She exhibits no distension. There is no tenderness.  Musculoskeletal: She exhibits no edema or tenderness.  2+ BLE peripheral edema to upper thighs as well as edema to bilateral upper extremities left greater than right.  Neurological: She is alert and oriented to person, place, and time. She has normal strength. She displays tremor (myoclonic jerking of the face and BUE, which resolve when distracted ). No cranial nerve deficit or sensory deficit.  Skin: Skin is warm and dry. No rash noted. She is not diaphoretic. No erythema.  Psychiatric: She has a normal mood and affect.  Vitals reviewed.    ED Treatments / Results  Labs (all labs ordered are listed, but only abnormal results are displayed) Labs Reviewed  CBC WITH DIFFERENTIAL/PLATELET - Abnormal; Notable for the following:       Result Value   WBC 11.8 (*)    Hemoglobin 11.8 (*)    HCT 35.2 (*)    RDW 15.8 (*)    Neutro Abs 9.0 (*)    All other components within normal limits  COMPREHENSIVE METABOLIC PANEL - Abnormal; Notable for the following:    Glucose, Bld 308 (*)    BUN 48 (*)    Creatinine, Ser 2.56 (*)    Calcium 7.7 (*)    Total Protein 5.2 (*)    Albumin 1.8 (*)    ALT 12 (*)    GFR calc non Af Amer 18 (*)    GFR calc Af Amer 21 (*)    All other components within normal limits  URINALYSIS, ROUTINE W REFLEX MICROSCOPIC - Abnormal; Notable for the following:    Glucose, UA >=500 (*)    Ketones, ur 5 (*)    Protein, ur >=300 (*)     Leukocytes, UA TRACE (*)    Bacteria, UA MANY (*)    All other components within normal limits  BRAIN NATRIURETIC PEPTIDE - Abnormal; Notable for the following:    B Natriuretic Peptide 330.7 (*)    All other components within normal limits  CBG MONITORING, ED - Abnormal; Notable for the following:    Glucose-Capillary 336 (*)    All other components within normal limits  URINE CULTURE  I-STAT TROPOININ, ED    EKG  EKG Interpretation  Date/Time:  Thursday May 15 2017 08:36:06 EDT Ventricular Rate:  79 PR Interval:    QRS Duration: 106 QT Interval:  397 QTC Calculation: 456 R Axis:   59 Text Interpretation:  Sinus rhythm Low voltage, extremity and precordial leads Artifact Otherwise no significant change Confirmed by [facility] MD, Ansley Mangiapane ([id number]) on 05/15/2017 9:19:04 AM       Radiology Ct Abdomen Pelvis Wo Contrast  Result Date: 05/15/2017 CLINICAL DATA:  Abdominal pain. Chronic renal failure and chronic congestive heart failure EXAM: CT ABDOMEN AND PELVIS WITHOUT CONTRAST TECHNIQUE: Multidetector CT imaging of the abdomen and pelvis was performed following the standard protocol without IV contrast. Oral contrast was administered. COMPARISON:  January 04, 2017 FINDINGS: Lower chest: There are bilateral pleural effusions, larger on the right than on the left. There is mild consolidation in the right base posteriorly, likely due to compressive atelectasis. There are foci of coronary artery  calcification. Visualized pericardium is not appreciably thickened. Hepatobiliary: No focal liver lesions are evident on this noncontrast enhanced study. Gallbladder is absent. There is no appreciable biliary duct dilatation. Pancreas: There is no pancreatic mass or pancreatic inflammatory focus. Spleen: No splenic lesions are evident. Adrenals/Urinary Tract: Adrenals appear unremarkable bilaterally. Kidneys bilaterally show no evident mass or hydronephrosis on either side. There is no evident renal or  ureteral calculi on either side. There are small phleboliths in the right pelvis, present previously, there are near but felt to be separate from the right ureter. The urinary bladder is midline with wall thickness within normal limits. A small amount of air is noted within the urinary bladder. Stomach/Bowel: Rectum is distended with stool. There is mild rectal wall thickening. There is minimal perirectal stranding in the soft tissues. There is also fairly extensive stool throughout sigmoid colon without wall thickening in the sigmoid colon. Elsewhere, there is no appreciable bowel wall thickening. There is no bowel obstruction. No free air or portal venous air. Vascular/Lymphatic: There are foci of vascular calcification in the aorta and right common iliac artery. No aneurysm evident. Major mesenteric vessels appear patent on this noncontrast enhanced study. There is also calcification in the visualized profunda and superficial femoral arteries. There is no evident adenopathy in the abdomen or pelvis. Reproductive: Uterus is absent.  There is no evident pelvic mass. Other: Appendix absent. There is moderate ascites throughout the abdomen and pelvis. No abscess is evident in the abdomen or pelvis. There is soft tissue edema consistent with generalized anasarca. Musculoskeletal: There are multiple sclerotic foci in bone, likely due to chronic renal failure/ secondary hyperparathyroidism. There is degenerative change throughout the lumbar spine. There are no lytic or destructive bone lesions.1 no intramuscular lesions are evident. IMPRESSION: There is diffuse anasarca with bilateral pleural effusions, larger on the right than the left. These findings are likely due to a combination of chronic renal failure and chronic congestive heart failure. Moderate ascites, possibly due to combination of chronic congestive heart failure and chronic renal failure. No abscess evident in the abdomen or pelvis. No bowel obstruction.  Focal air in the urinary bladder. This finding could be due to recent instrumentation. If patient has not had recent instrumentation, gas-forming infectious organism within the urine must be of concern. There is no appreciable hydronephrosis. No renal or ureteral calculus on either side. Wall thickening in the rectum with distention of rectum with stool. There is mild stranding in the perirectal fat. Suspect a degree of stercoral proctitis. Vascular calcification/aortoiliac and femoral artery atherosclerosis. There are foci of coronary artery calcification. Bony structures show sclerotic changes indicative of chronic renal failure/secondary hyperparathyroidism. Gallbladder, appendix, and uterus absent. Electronically Signed   By: Lowella Grip III M.D.   On: 05/15/2017 14:13    Procedures Procedures (including critical care time)  Medications Ordered in ED Medications  iopamidol (ISOVUE-300) 61 % injection (not administered)  nitrofurantoin (macrocrystal-monohydrate) (MACROBID) capsule 100 mg (not administered)  iopamidol (ISOVUE-300) 61 % injection 30 mL (30 mLs Oral Contrast Given 05/15/17 1130)     Initial Impression / Assessment and Plan / ED Course  I have reviewed the triage vital signs and the nursing notes.  Pertinent labs & imaging results that were available during my care of the patient were reviewed by me and considered in my medical decision making (see chart for details).      1. Tremors Distractible myoclonus. No focal deficits noted on exam. Will check electrolytes.  Labs without significant  electrolyte derangements.  Symptom completely resolved following up bladder emptying.  2. Anasarca Patient with known nephrotic syndrome from diabetic nephropathy. Also with CHF. Will get screening labs.  Labs with baseline renal function. BNP seems to be trending down when compared to previous. Low suspicion for CHF exacerbation.   3. Urgency prevoid bladder scan 216cc. Will  check for UTI given recent foley. UA with bacteruria and mild leuks. Will treat for possible UTI give recent foley placement.   4. Suprapubic pain Noted UTI. CT also obtain to rule out diverticulitis which revealed visceral anasarca with mild sterocolar proctitis. Able to have BM following enema. No need for Abx at this time.    Will have patient follow up closely with PCP.   The patient is safe for discharge with strict return precautions.   Final Clinical Impressions(s) / ED Diagnoses   Final diagnoses:  Anasarca  Stage 3 chronic kidney disease  Acute cystitis without hematuria  Other constipation   Disposition: Discharge  Condition: Good  I have discussed the results, Dx and Tx plan with the patient who expressed understanding and agree(s) with the plan. Discharge instructions discussed at great length. The patient was given strict return precautions who verbalized understanding of the instructions. No further questions at time of discharge.    New Prescriptions   NITROFURANTOIN, MACROCRYSTAL-MONOHYDRATE, (MACROBID) 100 MG CAPSULE    Take 1 capsule (100 mg total) by mouth 2 (two) times daily.   POLYETHYLENE GLYCOL (MIRALAX / GLYCOLAX) PACKET    Take 17 g by mouth 2 (two) times daily.    Follow Up: Aura Dials, MD [phone] [address], [address] [place] [address] [phone]  Call in 1 day to set up close follow up      Tyleek Smick, Grayce Sessions, MD 05/15/17 1635

## 2017-05-15 NOTE — ED Triage Notes (Signed)
Pt c/o shaking / tremors and headache. Pt states also states she is retaining fluid, edema of bilateral hands and feet and abdomen. Pt d/c from inpatient and refused rehab but now would like to go to rehab.

## 2017-05-15 NOTE — ED Notes (Signed)
Pt's urine initially clear yellow and pus in urine noted at the end of the catheterization.

## 2017-05-15 NOTE — ED Notes (Signed)
Patient tolerated Soap suds without complications. Yielded moderate amount of stool. Patient placed on bedpan.

## 2017-05-15 NOTE — Discharge Instructions (Signed)
Scheduled Meds:  Continuous Infusions:  PRN Meds:

## 2017-05-17 LAB — URINE CULTURE

## 2017-06-12 ENCOUNTER — Encounter (HOSPITAL_COMMUNITY): Payer: Self-pay | Admitting: Emergency Medicine

## 2017-06-12 ENCOUNTER — Emergency Department (HOSPITAL_COMMUNITY): Payer: Medicare Other

## 2017-06-12 ENCOUNTER — Inpatient Hospital Stay (HOSPITAL_COMMUNITY)
Admission: EM | Admit: 2017-06-12 | Discharge: 2017-06-20 | DRG: 682 | Disposition: A | Discharge status: Skilled Nursing Facility | Payer: Medicare Other | Attending: Family Medicine | Admitting: Family Medicine

## 2017-06-12 DIAGNOSIS — D631 Anemia in chronic kidney disease: Secondary | ICD-10-CM | POA: Diagnosis present

## 2017-06-12 DIAGNOSIS — E1122 Type 2 diabetes mellitus with diabetic chronic kidney disease: Secondary | ICD-10-CM | POA: Diagnosis present

## 2017-06-12 DIAGNOSIS — Z833 Family history of diabetes mellitus: Secondary | ICD-10-CM

## 2017-06-12 DIAGNOSIS — E039 Hypothyroidism, unspecified: Secondary | ICD-10-CM | POA: Diagnosis present

## 2017-06-12 DIAGNOSIS — E785 Hyperlipidemia, unspecified: Secondary | ICD-10-CM | POA: Diagnosis present

## 2017-06-12 DIAGNOSIS — N049 Nephrotic syndrome with unspecified morphologic changes: Secondary | ICD-10-CM

## 2017-06-12 DIAGNOSIS — A0472 Enterocolitis due to Clostridium difficile, not specified as recurrent: Secondary | ICD-10-CM | POA: Diagnosis present

## 2017-06-12 DIAGNOSIS — K219 Gastro-esophageal reflux disease without esophagitis: Secondary | ICD-10-CM | POA: Diagnosis present

## 2017-06-12 DIAGNOSIS — E1121 Type 2 diabetes mellitus with diabetic nephropathy: Secondary | ICD-10-CM | POA: Diagnosis not present

## 2017-06-12 DIAGNOSIS — E440 Moderate protein-calorie malnutrition: Secondary | ICD-10-CM | POA: Diagnosis present

## 2017-06-12 DIAGNOSIS — N179 Acute kidney failure, unspecified: Secondary | ICD-10-CM | POA: Diagnosis not present

## 2017-06-12 DIAGNOSIS — F319 Bipolar disorder, unspecified: Secondary | ICD-10-CM | POA: Diagnosis present

## 2017-06-12 DIAGNOSIS — I129 Hypertensive chronic kidney disease with stage 1 through stage 4 chronic kidney disease, or unspecified chronic kidney disease: Secondary | ICD-10-CM | POA: Diagnosis present

## 2017-06-12 DIAGNOSIS — W19XXXA Unspecified fall, initial encounter: Secondary | ICD-10-CM

## 2017-06-12 DIAGNOSIS — E7800 Pure hypercholesterolemia, unspecified: Secondary | ICD-10-CM | POA: Diagnosis present

## 2017-06-12 DIAGNOSIS — N184 Chronic kidney disease, stage 4 (severe): Secondary | ICD-10-CM | POA: Diagnosis present

## 2017-06-12 DIAGNOSIS — R601 Generalized edema: Secondary | ICD-10-CM

## 2017-06-12 DIAGNOSIS — Z6820 Body mass index (BMI) 20.0-20.9, adult: Secondary | ICD-10-CM

## 2017-06-12 DIAGNOSIS — R102 Pelvic and perineal pain: Secondary | ICD-10-CM

## 2017-06-12 DIAGNOSIS — IMO0002 Reserved for concepts with insufficient information to code with codable children: Secondary | ICD-10-CM

## 2017-06-12 DIAGNOSIS — E43 Unspecified severe protein-calorie malnutrition: Secondary | ICD-10-CM | POA: Diagnosis present

## 2017-06-12 DIAGNOSIS — E784 Other hyperlipidemia: Secondary | ICD-10-CM

## 2017-06-12 DIAGNOSIS — J449 Chronic obstructive pulmonary disease, unspecified: Secondary | ICD-10-CM | POA: Diagnosis present

## 2017-06-12 DIAGNOSIS — Z794 Long term (current) use of insulin: Secondary | ICD-10-CM

## 2017-06-12 DIAGNOSIS — Z90710 Acquired absence of both cervix and uterus: Secondary | ICD-10-CM

## 2017-06-12 DIAGNOSIS — E1165 Type 2 diabetes mellitus with hyperglycemia: Secondary | ICD-10-CM

## 2017-06-12 DIAGNOSIS — E11649 Type 2 diabetes mellitus with hypoglycemia without coma: Secondary | ICD-10-CM | POA: Diagnosis present

## 2017-06-12 DIAGNOSIS — Z8673 Personal history of transient ischemic attack (TIA), and cerebral infarction without residual deficits: Secondary | ICD-10-CM

## 2017-06-12 LAB — URINALYSIS, ROUTINE W REFLEX MICROSCOPIC
BILIRUBIN URINE: NEGATIVE
HGB URINE DIPSTICK: NEGATIVE
Ketones, ur: 20 mg/dL — AB
NITRITE: NEGATIVE
Protein, ur: >=300 mg/dL — AB
Specific Gravity, Urine: 1.012 (ref 1.005–1.030)
pH: 5 (ref 5.0–8.0)

## 2017-06-12 LAB — BASIC METABOLIC PANEL
Anion gap: 9 (ref 5–15)
BUN: 56 mg/dL — ABNORMAL HIGH (ref 6–20)
CO2: 21 mmol/L — ABNORMAL LOW (ref 22–32)
CREATININE: 2.74 mg/dL — AB (ref 0.44–1.00)
Calcium: 7.7 mg/dL — ABNORMAL LOW (ref 8.9–10.3)
Chloride: 104 mmol/L (ref 101–111)
GFR calc Af Amer: 19 mL/min — ABNORMAL LOW (ref >60)
GFR, EST NON AFRICAN AMERICAN: 17 mL/min — AB (ref >60)
Glucose, Bld: 362 mg/dL — ABNORMAL HIGH (ref 65–99)
POTASSIUM: 3.7 mmol/L (ref 3.5–5.1)
SODIUM: 134 mmol/L — AB (ref 135–145)

## 2017-06-12 LAB — CBC
HCT: 30.6 % — ABNORMAL LOW (ref 36.0–46.0)
Hemoglobin: 9.8 g/dL — ABNORMAL LOW (ref 12.0–15.0)
MCH: 27.1 pg (ref 26.0–34.0)
MCHC: 32 g/dL (ref 30.0–36.0)
MCV: 84.8 fL (ref 78.0–100.0)
PLATELETS: 299 10*3/uL (ref 150–400)
RBC: 3.61 MIL/uL — AB (ref 3.87–5.11)
RDW: 14.1 % (ref 11.5–15.5)
WBC: 9.7 10*3/uL (ref 4.0–10.5)

## 2017-06-12 LAB — CBG MONITORING, ED: GLUCOSE-CAPILLARY: 504 mg/dL — AB (ref 65–99)

## 2017-06-12 LAB — HEPATIC FUNCTION PANEL
ALT: 11 U/L — ABNORMAL LOW (ref 14–54)
AST: 13 U/L — AB (ref 15–41)
Albumin: 1.4 g/dL — ABNORMAL LOW (ref 3.5–5.0)
Alkaline Phosphatase: 72 U/L (ref 38–126)
Total Bilirubin: 0.7 mg/dL (ref 0.3–1.2)
Total Protein: 4.5 g/dL — ABNORMAL LOW (ref 6.5–8.1)

## 2017-06-12 LAB — TSH: TSH: 9.065 u[IU]/mL — ABNORMAL HIGH (ref 0.350–4.500)

## 2017-06-12 LAB — BRAIN NATRIURETIC PEPTIDE: B NATRIURETIC PEPTIDE 5: 511.6 pg/mL — AB (ref 0.0–100.0)

## 2017-06-12 MED ORDER — CARVEDILOL 3.125 MG PO TABS
3.1250 mg | ORAL_TABLET | Freq: Two times a day (BID) | ORAL | Status: DC
  Administered 2017-06-13 – 2017-06-18 (×11): 3.125 mg via ORAL
  Filled 2017-06-12 (×11): qty 1

## 2017-06-12 MED ORDER — INSULIN ASPART 100 UNIT/ML ~~LOC~~ SOLN
0.0000 [IU] | Freq: Three times a day (TID) | SUBCUTANEOUS | Status: DC
  Administered 2017-06-13: 7 [IU] via SUBCUTANEOUS
  Administered 2017-06-13: 1 [IU] via SUBCUTANEOUS
  Administered 2017-06-14: 7 [IU] via SUBCUTANEOUS
  Administered 2017-06-14 (×2): 3 [IU] via SUBCUTANEOUS
  Administered 2017-06-15: 5 [IU] via SUBCUTANEOUS
  Administered 2017-06-15: 3 [IU] via SUBCUTANEOUS
  Administered 2017-06-15: 2 [IU] via SUBCUTANEOUS
  Administered 2017-06-16: 7 [IU] via SUBCUTANEOUS
  Administered 2017-06-16: 9 [IU] via SUBCUTANEOUS

## 2017-06-12 MED ORDER — LEVOTHYROXINE SODIUM 175 MCG PO TABS
175.0000 ug | ORAL_TABLET | Freq: Every day | ORAL | Status: DC
  Administered 2017-06-13: 175 ug via ORAL
  Filled 2017-06-12 (×2): qty 1

## 2017-06-12 MED ORDER — INSULIN GLARGINE 100 UNIT/ML ~~LOC~~ SOLN
8.0000 [IU] | Freq: Every day | SUBCUTANEOUS | Status: DC
  Administered 2017-06-13: 8 [IU] via SUBCUTANEOUS
  Filled 2017-06-12 (×2): qty 0.08

## 2017-06-12 MED ORDER — ACETAMINOPHEN 325 MG PO TABS
650.0000 mg | ORAL_TABLET | Freq: Four times a day (QID) | ORAL | Status: DC | PRN
  Administered 2017-06-13 – 2017-06-20 (×8): 650 mg via ORAL
  Filled 2017-06-12 (×8): qty 2

## 2017-06-12 MED ORDER — POLYSACCHARIDE IRON COMPLEX 150 MG PO CAPS
150.0000 mg | ORAL_CAPSULE | Freq: Two times a day (BID) | ORAL | Status: DC
  Administered 2017-06-13 – 2017-06-20 (×15): 150 mg via ORAL
  Filled 2017-06-12 (×15): qty 1

## 2017-06-12 MED ORDER — ESCITALOPRAM OXALATE 20 MG PO TABS
20.0000 mg | ORAL_TABLET | Freq: Every day | ORAL | Status: DC
  Administered 2017-06-13 – 2017-06-20 (×8): 20 mg via ORAL
  Filled 2017-06-12 (×8): qty 1

## 2017-06-12 MED ORDER — METOCLOPRAMIDE HCL 5 MG PO TABS
5.0000 mg | ORAL_TABLET | Freq: Three times a day (TID) | ORAL | Status: DC
  Administered 2017-06-13 – 2017-06-16 (×9): 5 mg via ORAL
  Filled 2017-06-12 (×10): qty 1

## 2017-06-12 MED ORDER — INSULIN ASPART 100 UNIT/ML ~~LOC~~ SOLN
0.0000 [IU] | Freq: Every day | SUBCUTANEOUS | Status: DC
Start: 2017-06-12 — End: 2017-06-16
  Administered 2017-06-13: 2 [IU] via SUBCUTANEOUS
  Administered 2017-06-13: 5 [IU] via SUBCUTANEOUS

## 2017-06-12 MED ORDER — FUROSEMIDE 40 MG PO TABS
60.0000 mg | ORAL_TABLET | Freq: Every day | ORAL | Status: DC
  Administered 2017-06-13: 60 mg via ORAL
  Filled 2017-06-12: qty 1

## 2017-06-12 MED ORDER — ATORVASTATIN CALCIUM 20 MG PO TABS
20.0000 mg | ORAL_TABLET | Freq: Every day | ORAL | Status: DC
  Administered 2017-06-13 – 2017-06-20 (×8): 20 mg via ORAL
  Filled 2017-06-12 (×9): qty 1

## 2017-06-12 MED ORDER — HEPARIN SODIUM (PORCINE) 5000 UNIT/ML IJ SOLN
5000.0000 [IU] | Freq: Three times a day (TID) | INTRAMUSCULAR | Status: DC
  Administered 2017-06-13 – 2017-06-20 (×19): 5000 [IU] via SUBCUTANEOUS
  Filled 2017-06-12 (×20): qty 1

## 2017-06-12 MED ORDER — ACETAMINOPHEN 650 MG RE SUPP: 650.0000 mg | Freq: Four times a day (QID) | RECTAL | Status: DC | PRN

## 2017-06-12 MED ORDER — ONDANSETRON HCL 4 MG PO TABS: 4.0000 mg | ORAL_TABLET | Freq: Four times a day (QID) | ORAL | Status: DC | PRN

## 2017-06-12 MED ORDER — AMLODIPINE BESYLATE 5 MG PO TABS
5.0000 mg | ORAL_TABLET | Freq: Every day | ORAL | Status: DC
  Administered 2017-06-13: 5 mg via ORAL
  Filled 2017-06-12: qty 1

## 2017-06-12 MED ORDER — INSULIN ASPART 100 UNIT/ML ~~LOC~~ SOLN
3.0000 [IU] | Freq: Three times a day (TID) | SUBCUTANEOUS | Status: DC
  Administered 2017-06-13 (×2): 3 [IU] via SUBCUTANEOUS

## 2017-06-12 MED ORDER — GABAPENTIN 100 MG PO CAPS
100.0000 mg | ORAL_CAPSULE | Freq: Three times a day (TID) | ORAL | Status: DC
  Administered 2017-06-13 (×2): 100 mg via ORAL
  Filled 2017-06-12 (×2): qty 1

## 2017-06-12 MED ORDER — ONDANSETRON HCL 4 MG/2ML IJ SOLN
4.0000 mg | Freq: Four times a day (QID) | INTRAMUSCULAR | Status: DC | PRN
  Administered 2017-06-18 (×2): 4 mg via INTRAVENOUS
  Filled 2017-06-12 (×2): qty 2

## 2017-06-12 NOTE — ED Provider Notes (Signed)
[facility]-EMERGENCY DEPT Provider Note   CSN: [id number] Arrival date & time: 06/12/17  1407     History   Chief Complaint Chief Complaint  Patient presents with  . Congestive Heart Failure  . Fall    HPI Cassandra [name] is a 71 y.o. female.  HPI  Pt with hx of CHF, CKD, COPD, HTN and multiple other medical problems presenting after a mechanical fall.  She states she was using her walker and was trying to get back into bed.  Per EMS she fell down onto her knees- she tells me she then fell back onto her bottom. She states that she has pain in her neck and at the base of her skull- not sure if she struck her head or not- She also c/o pain in the tailbone region.  Denies much pain in her knees.  She has hx of CHF and swelling of her extremities which causes her pain, but this is not significantly changed.  No difficulty breathing or chest pain.  No LOC.  There are no other associated systemic symptoms, there are no other alleviating or modifying factors.    Past Medical History:  Diagnosis Date  . Allergic rhinitis   . Anemia   . Anxiety   . Arthritis    "mostly feet, hands" (10/17/2016)  . Benign hypertension with CKD (chronic kidney disease) stage III   . Benign paroxysmal positional vertigo   . Bipolar disorder (HCC)   . Broken finger   . Broken shoulder   . Broken toes   . Cervicalgia   . CHF (congestive heart failure) (HCC) 07/2016  . CKD (chronic kidney disease) stage 3, GFR 30-59 ml/min   . COPD (chronic obstructive pulmonary disease) (HCC)   . Depression   . Elevated liver enzymes Hep B/C neg 2014  . Fall at nursing home 10/15/2016  . GERD (gastroesophageal reflux disease)   . Glaucoma   . High cholesterol   . History of alcohol use   . History of blood transfusion 09/2016   "blood got really really low"  . Hypertension   . Hypothyroidism   . Interstitial cystitis    bladder stretched every 9 months  . Migraines    "pretty much qd" (10/17/2016)  . Pneumonia  12/2016  . Psoriasis   . Stroke (HCC) 05/16/2016   Left occipital and thalamic, right hippocampal  . Thyroid disease   . Tobacco use   . 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 Vitamin B12 deficiency     Patient Active Problem List   Diagnosis Date Noted  . CKD stage 4 due to type 2 diabetes mellitus (HCC) 06/12/2017  . Anasarca associated with disorder of kidney 06/12/2017  . Nephrotic syndrome due to type 2 diabetes mellitus (HCC) 06/12/2017  . Anasarca 05/09/2017  . Nausea vomiting and diarrhea 03/08/2017  . Constipation   . Generalized abdominal pain   . Fecal impaction (HCC) 01/04/2017  . HAP (hospital-acquired pneumonia) 12/23/2016  . HCAP (healthcare-associated pneumonia)   . Diabetes mellitus with complication (HCC)   . Hypoglycemia 12/03/2016  . Palliative care encounter   . Goals of care, counseling/discussion   . Cervical vertebral fracture (HCC) 10/17/2016  . Thoracic vertebral fracture (HCC) 10/17/2016  . Fall at nursing home 10/17/2016  . Common bile duct dilatation 10/17/2016  . Closed fracture of cervical vertebra (HCC)   . Recent Gram-negative bacteremia 10/07/2016  . Hyperkalemia   . Gastroesophageal reflux disease   . DKA,  type 1 (HCC) 09/06/2016  . Protein-calorie malnutrition, severe 08/28/2016  . AKI (acute kidney injury) (HCC) 07/19/2016  . Acute on chronic diastolic CHF (congestive heart failure) (HCC) 07/19/2016  . Dehydration   . Cerebral embolism with cerebral infarction 07/13/2016  . Left ventricular diastolic dysfunction, NYHA class 1 07/11/2016  . History of CVA (cerebrovascular accident)   . HLD (hyperlipidemia)   . Anemia of chronic disease   . Physical deconditioning   . Acute on chronic kidney failure (HCC)   . Diabetic ketoacidosis with coma associated with type 1 diabetes mellitus (HCC)   . Cerebral thrombosis with cerebral infarction 05/15/2016  . Generalized anxiety disorder 05/10/2016  . Diabetic ketoacidosis  without coma associated with type 1 diabetes mellitus (HCC) 05/06/2016  . Insulin dependent type 2 diabetes mellitus, uncontrolled (HCC)   . Depression   . CKD (chronic kidney disease) stage 3, GFR 30-59 ml/min   . Uncontrolled Hypothyroidism   . Vitamin B12 deficiency   . Benign paroxysmal positional vertigo   . Anxiety   . Allergic rhinitis   . Glaucoma   . Benign hypertension with CKD (chronic kidney disease) stage III   . Tobacco abuse   . Cervicalgia   . Elevated liver enzymes   . History of alcohol use     Past Surgical History:  Procedure Laterality Date  . ANTERIOR CERVICAL DECOMP/DISCECTOMY FUSION  ~ 2009  . APPENDECTOMY    . BACK SURGERY    . BLADDER SUSPENSION    . CATARACT EXTRACTION W/ INTRAOCULAR LENS  IMPLANT, BILATERAL Bilateral   . DILATION AND CURETTAGE OF UTERUS    . HERNIA REPAIR    . LAPAROSCOPIC CHOLECYSTECTOMY    . TONSILLECTOMY    . TUBAL LIGATION    . VAGINAL HYSTERECTOMY     with oophorectomy    OB History    No data available       Home Medications    Prior to Admission medications   Medication Sig Start Date End Date Taking? Authorizing Provider  amLODipine (NORVASC) 5 MG tablet Take 1 tablet (5 mg total) by mouth daily. 05/15/17  Yes Randel Pigg, Dorma Russell, MD  atorvastatin (LIPITOR) 10 MG tablet Take 1 tablet (10 mg total) by mouth daily at 6 PM. Patient taking differently: Take 20 mg by mouth daily at 6 PM.  12/27/16  Yes Ghimire, Werner Lean, MD  carvedilol (COREG) 3.125 MG tablet Take 1 tablet (3.125 mg total) by mouth 2 (two) times daily with a meal. 12/27/16  Yes Ghimire, Werner Lean, MD  escitalopram (LEXAPRO) 10 MG tablet Take 1.5 tablets (15 mg total) by mouth daily. Patient taking differently: Take 20 mg by mouth every morning.  12/27/16  Yes Ghimire, Werner Lean, MD  furosemide (LASIX) 20 MG tablet Take 3 tablets (60 mg total) by mouth daily. 05/14/17  Yes Randel Pigg, Dorma Russell, MD  gabapentin (NEURONTIN) 100 MG capsule Take 1 capsule (100 mg  total) by mouth 3 (three) times daily. 05/14/17  Yes Randel Pigg, Dorma Russell, MD  Insulin Glargine (LANTUS) 100 UNIT/ML Solostar Pen Inject 8 Units into the skin daily at 10 pm. 05/14/17  Yes Randel Pigg, Dorma Russell, MD  iron polysaccharides (NIFEREX) 150 MG capsule Take 150 mg by mouth 2 (two) times daily.   Yes [provider]  levothyroxine (SYNTHROID, LEVOTHROID) 175 MCG tablet Take 1 tablet (175 mcg total) by mouth daily before breakfast. 01/07/17  Yes Sudini, Wardell Heath, MD  metoCLOPramide (REGLAN) 5 MG tablet Take 1 tablet (5 mg total)  by mouth 3 (three) times daily before meals. 05/14/17  Yes Randel Pigg, Dorma Russell, MD  ondansetron (ZOFRAN) 4 MG tablet Take 4 mg by mouth every 8 (eight) hours as needed for nausea or vomiting.   Yes [provider]  insulin lispro (HUMALOG) 100 UNIT/ML injection Inject 0.03 mLs (3 Units total) into the skin 3 (three) times daily with meals. Patient taking differently: Inject 5-7 Units into the skin 3 (three) times daily as needed for high blood sugar.  03/12/17   Rhetta Mura, MD    Family History Family History  Problem Relation Age of Onset  . Alcohol abuse Mother   . Arthritis Mother   . Asthma Mother   . Cancer Mother        colon cancer  . Hypertension Mother   . Migraines Mother   . Stroke Mother   . Lung disease Mother   . COPD Mother   . Diabetes Father   . Hypertension Father   . Heart disease Father   . Heart attack Father   . Heart disease Paternal Grandmother   . Diabetes Paternal Grandmother   . Stroke Paternal Grandmother   . Cancer Paternal Grandmother   . Diabetes Paternal Grandfather     Social History Social History  Substance Use Topics  . Smoking status: Former Smoker    Packs/day: 0.25    Years: 10.00    Types: Cigarettes    Quit date: 12/1923  . Smokeless tobacco: Never Used     Comment: QUIT SMOKING 11/2016  . Alcohol use No     Comment: Hasn't had any alcohol "for 2 yrs" (10/17/2016)     Allergies     Alprazolam; Percocet [oxycodone-acetaminophen]; Codeine; Doxycycline; Hydrocodone; Omnicef [cefdinir]; Tramadol; Augmentin [amoxicillin-pot clavulanate]; and Ciprofloxacin   Review of Systems Review of Systems  ROS reviewed and all otherwise negative except for mentioned in HPI   Physical Exam Updated Vital Signs BP (!) 145/53 (BP Location: Right Arm)   Pulse 82   Temp 98.8 F (37.1 C) (Oral)   Resp 18   Wt 61 kg (134 lb 6.4 oz)   SpO2 95%   BMI 24.58 kg/m  Vitals reviewed Physical Exam Physical Examination: General appearance - alert, chronically ill appearing, and in no distress Mental status - alert, oriented to person, place, and time Eyes - pupils equal and reactive, extraocular eye movements intact Neck - ttp in midline- mild, c-collar in place Chest - clear to auscultation, no wheezes, rales or rhonchi, symmetric air entry Heart - normal rate, regular rhythm, normal S1, S2, no murmurs, rubs, clicks or gallops Abdomen - soft, nontender, mildly distended, no masses or organomegaly Neurological - alert, oriented x 3, normal speech, no focal findings or movement disorder noted Musculoskeletal - no joint tenderness, deformity or swelling- knees are not ttp bilaterally Extremities - peripheral pulses normal, bilaterally 2+ pitting edema up to knees- symmetric, no clubbing or cyanosis Skin - normal coloration and turgor, no rashes, venous stasis changes of legs bilaterally  ED Treatments / Results  Labs (all labs ordered are listed, but only abnormal results are displayed) Labs Reviewed  CBC - Abnormal; Notable for the following:       Result Value   RBC 3.61 (*)    Hemoglobin 9.8 (*)    HCT 30.6 (*)    All other components within normal limits  BASIC METABOLIC PANEL - Abnormal; Notable for the following:    Sodium 134 (*)    CO2 21 (*)  Glucose, Bld 362 (*)    BUN 56 (*)    Creatinine, Ser 2.74 (*)    Calcium 7.7 (*)    GFR calc non Af Amer 17 (*)    GFR calc  Af Amer 19 (*)    All other components within normal limits  BRAIN NATRIURETIC PEPTIDE - Abnormal; Notable for the following:    B Natriuretic Peptide 511.6 (*)    All other components within normal limits  HEPATIC FUNCTION PANEL - Abnormal; Notable for the following:    Total Protein 4.5 (*)    Albumin 1.4 (*)    AST 13 (*)    ALT 11 (*)    Bilirubin, Direct <0.1 (*)    All other components within normal limits  URINALYSIS, ROUTINE W REFLEX MICROSCOPIC - Abnormal; Notable for the following:    APPearance HAZY (*)    Glucose, UA >=500 (*)    Ketones, ur 20 (*)    Protein, ur >=300 (*)    Leukocytes, UA TRACE (*)    Bacteria, UA MANY (*)    Squamous Epithelial / LPF 0-5 (*)    All other components within normal limits  TSH - Abnormal; Notable for the following:    TSH 9.065 (*)    All other components within normal limits  GLUCOSE, CAPILLARY - Abnormal; Notable for the following:    Glucose-Capillary 573 (*)    All other components within normal limits  GLUCOSE, CAPILLARY - Abnormal; Notable for the following:    Glucose-Capillary 463 (*)    All other components within normal limits  GLUCOSE, CAPILLARY - Abnormal; Notable for the following:    Glucose-Capillary 320 (*)    All other components within normal limits  GLUCOSE, CAPILLARY - Abnormal; Notable for the following:    Glucose-Capillary 126 (*)    All other components within normal limits  CBG MONITORING, ED - Abnormal; Notable for the following:    Glucose-Capillary 504 (*)    All other components within normal limits    EKG  EKG Interpretation  Date/Time:  Thursday June 12 2017 14:21:47 EDT Ventricular Rate:  76 PR Interval:    QRS Duration: 67 QT Interval:  410 QTC Calculation: 461 R Axis:   61 Text Interpretation:  Sinus rhythm Low voltage, extremity and precordial leads RSR' in V1 or V2, probably normal variant Minimal ST depression, anterolateral leads No significant change since last tracing Confirmed by  Jerelyn ScottLinker, Juan Olthoff [phone]([id number]) on 06/12/2017 3:19:20 PM       Radiology Dg Chest 2 View  Result Date: 06/12/2017 CLINICAL DATA:  Worsening acute on chronic anasarca. Weakness for the past week. EXAM: CHEST  2 VIEW COMPARISON:  05/09/2017. FINDINGS: Normal sized heart. Clear lungs with normal vascularity. Mild increase in size of previously demonstrated small bilateral pleural effusions, with a probable subpulmonic component on the right. Thoracic spine degenerative changes. IMPRESSION: Mild increase in size of small bilateral pleural effusions. Electronically Signed   By: Beckie SaltsSteven  Reid M.D.   On: 06/12/2017 21:19   Dg Lumbar Spine Complete  Result Date: 06/12/2017 CLINICAL DATA:  Larey SeatFell today with pain in the low back EXAM: LUMBAR SPINE - COMPLETE 4+ VIEW COMPARISON:  CT abdomen pelvis of 05/15/2017 FINDINGS: The lumbar vertebrae are in normal alignment. Intervertebral disc spaces appear normal with the exception of L1-2 where there is loss of disc space and sclerosis with spurring consistent with degenerative disc disease. No compression deformity is seen. The SI joints are corticated. IMPRESSION: No acute abnormality.  Degenerative disc disease at L1-2. Electronically Signed   By: Dwyane Dee M.D.   On: 06/12/2017 15:25   Dg Sacrum/coccyx  Result Date: 06/12/2017 CLINICAL DATA:  Larey Seat today with pain in the low back EXAM: SACRUM AND COCCYX - 2+ VIEW COMPARISON:  CT abdomen and pelvis of 05/15/2017 FINDINGS: The sacrococcygeal elements are in normal alignment. No acute fracture is seen. The pelvic rami are intact. The SI joints appear corticated. The sacral foramina appear corticated. IMPRESSION: No acute abnormality. Electronically Signed   By: Dwyane Dee M.D.   On: 06/12/2017 15:23   Ct Head Wo Contrast  Result Date: 06/12/2017 CLINICAL DATA:  Larey Seat this morning, neck stiffness, history hypertension, smoking, bipolar disorder, CHF, COPD, type I diabetes mellitus, stroke, stage III chronic kidney disease,  COPD EXAM: CT HEAD WITHOUT CONTRAST CT CERVICAL SPINE WITHOUT CONTRAST TECHNIQUE: Multidetector CT imaging of the head and cervical spine was performed following the standard protocol without intravenous contrast. Multiplanar CT image reconstructions of the cervical spine were also generated. COMPARISON:  CT head 05/09/2017, CT cervical spine 12/11/2016 FINDINGS: CT HEAD FINDINGS Brain: Generalized atrophy. Normal ventricular morphology. No midline shift or mass effect. Small vessel chronic ischemic changes of deep cerebral white matter. Small old lacunar infarct LEFT thalamus. No intracranial hemorrhage, mass lesion, evidence of acute infarction, or extra-axial fluid collection. Vascular: Atherosclerotic calcifications of the internal carotid arteries at the skullbase. Skull: Intact Sinuses/Orbits: Clear Other: N/A CT CERVICAL SPINE FINDINGS Alignment: Minimal anterolisthesis C4-C5. Remaining alignment normal Skull base and vertebrae: Skullbase intact. Prior anterior fusion C5-C6 with incorporated bone plug. Chronic anterior height loss C7, stable. Vertebral body heights otherwise maintained without fracture or bone destruction. Facet degenerative changes bilaterally. Calcification of the anterior longitudinal ligament. Soft tissues and spinal canal: Prevertebral soft tissues normal thickness Disc levels: Disc space narrowing especially C6-C7 and C7-T1. Calcification of posterior longitudinal ligament at C2-C3. Upper chest: BILATERAL pleural effusions at the lung apices greater on RIGHT. Other: N/A IMPRESSION: Atrophy with small vessel chronic ischemic changes of deep cerebral white matter. Old lacunar infarct LEFT thalamus. No acute intracranial abnormalities. Degenerative disc and facet disease changes of the cervical spine. Prior anterior fusion C5-C6. Chronic anterior height loss C7 unchanged. No acute cervical spine abnormalities. Electronically Signed   By: Ulyses Southward M.D.   On: 06/12/2017 15:22   Ct  Cervical Spine Wo Contrast  Result Date: 06/12/2017 CLINICAL DATA:  Larey Seat this morning, neck stiffness, history hypertension, smoking, bipolar disorder, CHF, COPD, type I diabetes mellitus, stroke, stage III chronic kidney disease, COPD EXAM: CT HEAD WITHOUT CONTRAST CT CERVICAL SPINE WITHOUT CONTRAST TECHNIQUE: Multidetector CT imaging of the head and cervical spine was performed following the standard protocol without intravenous contrast. Multiplanar CT image reconstructions of the cervical spine were also generated. COMPARISON:  CT head 05/09/2017, CT cervical spine 12/11/2016 FINDINGS: CT HEAD FINDINGS Brain: Generalized atrophy. Normal ventricular morphology. No midline shift or mass effect. Small vessel chronic ischemic changes of deep cerebral white matter. Small old lacunar infarct LEFT thalamus. No intracranial hemorrhage, mass lesion, evidence of acute infarction, or extra-axial fluid collection. Vascular: Atherosclerotic calcifications of the internal carotid arteries at the skullbase. Skull: Intact Sinuses/Orbits: Clear Other: N/A CT CERVICAL SPINE FINDINGS Alignment: Minimal anterolisthesis C4-C5. Remaining alignment normal Skull base and vertebrae: Skullbase intact. Prior anterior fusion C5-C6 with incorporated bone plug. Chronic anterior height loss C7, stable. Vertebral body heights otherwise maintained without fracture or bone destruction. Facet degenerative changes bilaterally. Calcification of the anterior longitudinal ligament. Soft  tissues and spinal canal: Prevertebral soft tissues normal thickness Disc levels: Disc space narrowing especially C6-C7 and C7-T1. Calcification of posterior longitudinal ligament at C2-C3. Upper chest: BILATERAL pleural effusions at the lung apices greater on RIGHT. Other: N/A IMPRESSION: Atrophy with small vessel chronic ischemic changes of deep cerebral white matter. Old lacunar infarct LEFT thalamus. No acute intracranial abnormalities. Degenerative disc and  facet disease changes of the cervical spine. Prior anterior fusion C5-C6. Chronic anterior height loss C7 unchanged. No acute cervical spine abnormalities. Electronically Signed   By: Ulyses Southward M.D.   On: 06/12/2017 15:22    Procedures Procedures (including critical care time)  Medications Ordered in ED Medications  amLODipine (NORVASC) tablet 5 mg (5 mg Oral Given 06/13/17 1112)  atorvastatin (LIPITOR) tablet 20 mg (not administered)  carvedilol (COREG) tablet 3.125 mg (3.125 mg Oral Given 06/13/17 0841)  escitalopram (LEXAPRO) tablet 20 mg (20 mg Oral Given 06/13/17 1112)  furosemide (LASIX) tablet 60 mg (60 mg Oral Given 06/13/17 1112)  gabapentin (NEURONTIN) capsule 100 mg (100 mg Oral Given 06/13/17 1112)  insulin glargine (LANTUS) injection 8 Units (8 Units Subcutaneous Given 06/13/17 0125)  metoCLOPramide (REGLAN) tablet 5 mg (5 mg Oral Not Given 06/13/17 1111)  levothyroxine (SYNTHROID, LEVOTHROID) tablet 175 mcg (175 mcg Oral Given 06/13/17 0841)  iron polysaccharides (NIFEREX) capsule 150 mg (150 mg Oral Given 06/13/17 1112)  insulin aspart (novoLOG) injection 0-9 Units (1 Units Subcutaneous Given 06/13/17 1258)  insulin aspart (novoLOG) injection 0-5 Units (5 Units Subcutaneous Given 06/13/17 0124)  insulin aspart (novoLOG) injection 3 Units (3 Units Subcutaneous Given 06/13/17 1258)  heparin injection 5,000 Units (not administered)  acetaminophen (TYLENOL) tablet 650 mg (not administered)    Or  acetaminophen (TYLENOL) suppository 650 mg (not administered)  ondansetron (ZOFRAN) tablet 4 mg (not administered)    Or  ondansetron (ZOFRAN) injection 4 mg (not administered)  hydrocortisone (ANUSOL-HC) 2.5 % rectal cream ( Rectal Given 06/13/17 0650)  feeding supplement (ENSURE ENLIVE) (ENSURE ENLIVE) liquid 237 mL (237 mLs Oral Given 06/13/17 1112)  insulin aspart (novoLOG) injection 6 Units (6 Units Subcutaneous Given 06/13/17 0706)     Initial Impression / Assessment and Plan / ED  Course  I have reviewed the triage vital signs and the nursing notes.  Pertinent labs & imaging results that were available during my care of the patient were reviewed by me and considered in my medical decision making (see chart for details).     Pt presenting after mechanical fall while walking with her walker today.  Head ct, cervical spine CT and xrays of back and tailbone are reassuring.  She has ongoing swelling of legs and abdomen/anasarca that is chronic.  She uses walker normally at home, has home health as well.  Will check labs to ensure no acute worsening of renal failure/anemia, etc.  Pt signed out to Dr. Rubin Payor pending lab results and disposition.    Final Clinical Impressions(s) / ED Diagnoses   Final diagnoses:  Fall, initial encounter  Anasarca    New Prescriptions Current Discharge Medication List       Jerelyn Scott, MD 06/13/17 1318

## 2017-06-12 NOTE — ED Notes (Signed)
MD at bedside. 

## 2017-06-12 NOTE — ED Notes (Signed)
ED Provider at bedside. 

## 2017-06-12 NOTE — ED Notes (Signed)
Got patient on/off bedpan, cleaned up and bed remade, chux in place.

## 2017-06-12 NOTE — ED Provider Notes (Signed)
  Physical Exam  BP (!) 159/70 (BP Location: Right Arm)   Pulse 80   Temp 97.7 F (36.5 C) (Oral)   Resp 18   SpO2 97%   Physical Exam  ED Course  Procedures  MDM Patient with acute on chronic anasarca. Has been getting worse. Unable to manage at home at this time. BNP is elevated to over 500. Recently has been 300. Not hypoxic with ambulation however worsening anasarca. May benefit from internal medicine evaluation and admission. alreadty has health assistance at home.       Benjiman CorePickering, Sherwin Hollingshed, MD 06/12/17 2031

## 2017-06-12 NOTE — ED Triage Notes (Addendum)
PT fell this morning onto knees; was on walker and missed bed and kneeling on floor. Fire came and got her up. She reports neck stiff from morning but denies head or neck trauma. C collar in place. Complains of headache at base of skull. Legs are rock hard. Belly distended but soft.

## 2017-06-12 NOTE — ED Notes (Signed)
Patient uneasy about going home because she states her husband can't take care of her. MD notified - will talk to patient and husband.

## 2017-06-12 NOTE — ED Notes (Addendum)
Patient transported to X-ray and CT 

## 2017-06-12 NOTE — ED Notes (Signed)
Pt. Ambulated 80 feet in total with use of walker, used restroom during ambulation.  Pt. Ambulated with a left sided dominant/ drift. Patient stated dizziness, blurry vision, but ambulated well, with no loss of balance or mechanical issue.

## 2017-06-12 NOTE — ED Notes (Signed)
Patient transported to x-ray. ?

## 2017-06-12 NOTE — ED Notes (Signed)
Pt remains in imaging.

## 2017-06-12 NOTE — H&P (Signed)
History and Physical    Cassandra [name] ZOX:[id number] DOB: 07-08-1946 DOA: 06/12/2017  PCP: Henrine Screws, MD  Patient coming from: Home  I have personally briefly reviewed patient's old medical records in [facility]  Chief Complaint: Anasarca  HPI: Cassandra [name] is a 71 y.o. female with medical history significant of brittle DM2, insulin dependent, multiple admits for DKA, CKD stage 3 (now appears progressed to stage 4).  Patient does not have a history of CHF, 2D echo last month demonstrated normal EF 65-70%.  Rather her severe peripheral edema and Anasarca is due to albumins of 1.8 and nephrotic syndrome also the opinion of Dr. Hyman Hopes on consult note last month.  Patient was recommended to go to SNF last month at discharge but elected to go home instead.  She has had worsening generalized weakness at home, worsening peripheral edema and is now unable to be cared for by family and home health at home she says.     Review of Systems: As per HPI otherwise 10 point review of systems negative.   Past Medical History:  Diagnosis Date  . Allergic rhinitis   . Anemia   . Anxiety   . Arthritis    "mostly feet, hands" (10/17/2016)  . Benign hypertension with CKD (chronic kidney disease) stage III   . Benign paroxysmal positional vertigo   . Bipolar disorder (HCC)   . Broken finger   . Broken shoulder   . Broken toes   . Cervicalgia   . CHF (congestive heart failure) (HCC) 07/2016  . CKD (chronic kidney disease) stage 3, GFR 30-59 ml/min   . COPD (chronic obstructive pulmonary disease) (HCC)   . Depression   . Elevated liver enzymes Hep B/C neg 2014  . Fall at nursing home 10/15/2016  . GERD (gastroesophageal reflux disease)   . Glaucoma   . High cholesterol   . History of alcohol use   . History of blood transfusion 09/2016   "blood got really really low"  . Hypertension   . Hypothyroidism   . Interstitial cystitis    bladder stretched every 9 months  .  Migraines    "pretty much qd" (10/17/2016)  . Pneumonia 12/2016  . Psoriasis   . Stroke (HCC) 05/16/2016   Left occipital and thalamic, right hippocampal  . Thyroid disease   . Tobacco use   . 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 Vitamin B12 deficiency     Past Surgical History:  Procedure Laterality Date  . ANTERIOR CERVICAL DECOMP/DISCECTOMY FUSION  ~ 2009  . APPENDECTOMY    . BACK SURGERY    . BLADDER SUSPENSION    . CATARACT EXTRACTION W/ INTRAOCULAR LENS  IMPLANT, BILATERAL Bilateral   . DILATION AND CURETTAGE OF UTERUS    . HERNIA REPAIR    . LAPAROSCOPIC CHOLECYSTECTOMY    . TONSILLECTOMY    . TUBAL LIGATION    . VAGINAL HYSTERECTOMY     with oophorectomy     reports that she quit smoking about 93 years ago. Her smoking use included Cigarettes. She has a 2.50 pack-year smoking history. She has never used smokeless tobacco. She reports that she does not drink alcohol or use drugs.  Allergies  Allergen Reactions  . Alprazolam Other (See Comments)    Family preference, for patient to not take med  . Percocet [Oxycodone-Acetaminophen] Other (See Comments)    Family preference, for patient to not take med  . Codeine Diarrhea and  Nausea And Vomiting  . Doxycycline Diarrhea and Nausea And Vomiting  . Hydrocodone Nausea And Vomiting  . Omnicef [Cefdinir] Nausea Only and Other (See Comments)    Constipation, tolerated Zosyn  . Tramadol Other (See Comments)    Pt hallucinates when taking medication. Family request not to give medication to pt   . Augmentin [Amoxicillin-Pot Clavulanate] Hives and Rash    Has patient had a PCN reaction causing immediate rash, facial/tongue/throat swelling, SOB or lightheadedness with hypotension: Yes Has patient had a PCN reaction causing severe rash involving mucus membranes or skin necrosis: Yes Did a PCN reaction that required hospitalization No Did PCN reaction occurring within the last 10 years: Yes If all of the  above answers are "NO", then may proceed with Cephalosporin use.  Pt states she has taken penicillin since, and was ok with it...  . Ciprofloxacin Hives    Tolerated LVQ in 12/2016    Family History  Problem Relation Age of Onset  . Alcohol abuse Mother   . Arthritis Mother   . Asthma Mother   . Cancer Mother        colon cancer  . Hypertension Mother   . Migraines Mother   . Stroke Mother   . Lung disease Mother   . COPD Mother   . Diabetes Father   . Hypertension Father   . Heart disease Father   . Heart attack Father   . Heart disease Paternal Grandmother   . Diabetes Paternal Grandmother   . Stroke Paternal Grandmother   . Cancer Paternal Grandmother   . Diabetes Paternal Grandfather      Prior to Admission medications   Medication Sig Start Date End Date Taking? Authorizing Provider  amLODipine (NORVASC) 5 MG tablet Take 1 tablet (5 mg total) by mouth daily. 05/15/17  Yes Randel PiggSilva Zapata, Dorma RussellEdwin, MD  atorvastatin (LIPITOR) 10 MG tablet Take 1 tablet (10 mg total) by mouth daily at 6 PM. Patient taking differently: Take 20 mg by mouth daily at 6 PM.  12/27/16  Yes Ghimire, Werner LeanShanker M, MD  carvedilol (COREG) 3.125 MG tablet Take 1 tablet (3.125 mg total) by mouth 2 (two) times daily with a meal. 12/27/16  Yes Ghimire, Werner LeanShanker M, MD  escitalopram (LEXAPRO) 10 MG tablet Take 1.5 tablets (15 mg total) by mouth daily. Patient taking differently: Take 20 mg by mouth every morning.  12/27/16  Yes Ghimire, Werner LeanShanker M, MD  furosemide (LASIX) 20 MG tablet Take 3 tablets (60 mg total) by mouth daily. 05/14/17  Yes Randel PiggSilva Zapata, Dorma RussellEdwin, MD  gabapentin (NEURONTIN) 100 MG capsule Take 1 capsule (100 mg total) by mouth 3 (three) times daily. 05/14/17  Yes Randel PiggSilva Zapata, Dorma RussellEdwin, MD  Insulin Glargine (LANTUS) 100 UNIT/ML Solostar Pen Inject 8 Units into the skin daily at 10 pm. 05/14/17  Yes Randel PiggSilva Zapata, Dorma RussellEdwin, MD  iron polysaccharides (NIFEREX) 150 MG capsule Take 150 mg by mouth 2 (two) times daily.    Yes [provider]  levothyroxine (SYNTHROID, LEVOTHROID) 175 MCG tablet Take 1 tablet (175 mcg total) by mouth daily before breakfast. 01/07/17  Yes Sudini, Wardell HeathSrikar, MD  metoCLOPramide (REGLAN) 5 MG tablet Take 1 tablet (5 mg total) by mouth 3 (three) times daily before meals. 05/14/17  Yes Randel PiggSilva Zapata, Dorma RussellEdwin, MD  ondansetron (ZOFRAN) 4 MG tablet Take 4 mg by mouth every 8 (eight) hours as needed for nausea or vomiting.   Yes [provider]  insulin lispro (HUMALOG) 100 UNIT/ML injection Inject 0.03 mLs (3  Units total) into the skin 3 (three) times daily with meals. Patient taking differently: Inject 5-7 Units into the skin 3 (three) times daily as needed for high blood sugar.  03/12/17   Rhetta Mura, MD    Physical Exam: Vitals:   06/12/17 2030 06/12/17 2100 06/12/17 2115 06/12/17 2130  BP: (!) 157/83 (!) 159/79 (!) 168/79 (!) 168/79  Pulse: 84 84 84 87  Resp: 12 14 12 11   Temp:      TempSrc:      SpO2: 94% 93% 96% 95%    Constitutional: NAD, calm, comfortable Eyes: PERRL, lids and conjunctivae normal ENMT: Mucous membranes are moist. Posterior pharynx clear of any exudate or lesions.Normal dentition.  Neck: normal, supple, no masses, no thyromegaly Respiratory: clear to auscultation bilaterally, no wheezing, no crackles. Normal respiratory effort. No accessory muscle use.  Cardiovascular: Regular rate and rhythm, no murmurs / rubs / gallops. 2+ pitting edema. 2+ pedal pulses. No carotid bruits.  Abdomen: no tenderness, no masses palpated. No hepatosplenomegaly. Bowel sounds positive.  Musculoskeletal: no clubbing / cyanosis. No joint deformity upper and lower extremities. Good ROM, no contractures. Normal muscle tone.  Skin: no rashes, lesions, ulcers. No induration Neurologic: CN 2-12 grossly intact. Sensation intact, DTR normal. Strength 5/5 in all 4.  Psychiatric: Normal judgment and insight. Alert and oriented x 3. Normal mood.    Labs on Admission: I  have personally reviewed following labs and imaging studies  CBC:  Recent Labs Lab 06/12/17 1553  WBC 9.7  HGB 9.8*  HCT 30.6*  MCV 84.8  PLT 299   Basic Metabolic Panel:  Recent Labs Lab 06/12/17 1553  NA 134*  K 3.7  CL 104  CO2 21*  GLUCOSE 362*  BUN 56*  CREATININE 2.74*  CALCIUM 7.7*   GFR: CrCl cannot be calculated (Unknown ideal weight.). Liver Function Tests: No results for input(s): AST, ALT, ALKPHOS, BILITOT, PROT, ALBUMIN in the last 168 hours. No results for input(s): LIPASE, AMYLASE in the last 168 hours. No results for input(s): AMMONIA in the last 168 hours. Coagulation Profile: No results for input(s): INR, PROTIME in the last 168 hours. Cardiac Enzymes: No results for input(s): CKTOTAL, CKMB, CKMBINDEX, TROPONINI in the last 168 hours. BNP (last 3 results) No results for input(s): PROBNP in the last 8760 hours. HbA1C: No results for input(s): HGBA1C in the last 72 hours. CBG: No results for input(s): GLUCAP in the last 168 hours. Lipid Profile: No results for input(s): CHOL, HDL, LDLCALC, TRIG, CHOLHDL, LDLDIRECT in the last 72 hours. Thyroid Function Tests: No results for input(s): TSH, T4TOTAL, FREET4, T3FREE, THYROIDAB in the last 72 hours. Anemia Panel: No results for input(s): VITAMINB12, FOLATE, FERRITIN, TIBC, IRON, RETICCTPCT in the last 72 hours. Urine analysis:    Component Value Date/Time   COLORURINE YELLOW 05/15/2017 0800   APPEARANCEUR CLEAR 05/15/2017 0800   APPEARANCEUR Clear 06/15/2014 1508   LABSPEC 1.012 05/15/2017 0800   LABSPEC 1.010 06/15/2014 1508   PHURINE 6.0 05/15/2017 0800   GLUCOSEU >=500 (A) 05/15/2017 0800   GLUCOSEU Negative 06/15/2014 1508   HGBUR NEGATIVE 05/15/2017 0800   BILIRUBINUR NEGATIVE 05/15/2017 0800   BILIRUBINUR Negative 06/15/2014 1508   KETONESUR 5 (A) 05/15/2017 0800   PROTEINUR >=300 (A) 05/15/2017 0800   UROBILINOGEN 0.2 09/14/2015 1617   NITRITE NEGATIVE 05/15/2017 0800   LEUKOCYTESUR  TRACE (A) 05/15/2017 0800   LEUKOCYTESUR Negative 06/15/2014 1508    Radiological Exams on Admission: Dg Chest 2 View  Result Date: 06/12/2017  CLINICAL DATA:  Worsening acute on chronic anasarca. Weakness for the past week. EXAM: CHEST  2 VIEW COMPARISON:  05/09/2017. FINDINGS: Normal sized heart. Clear lungs with normal vascularity. Mild increase in size of previously demonstrated small bilateral pleural effusions, with a probable subpulmonic component on the right. Thoracic spine degenerative changes. IMPRESSION: Mild increase in size of small bilateral pleural effusions. Electronically Signed   By: Beckie Salts M.D.   On: 06/12/2017 21:19   Dg Lumbar Spine Complete  Result Date: 06/12/2017 CLINICAL DATA:  Larey Seat today with pain in the low back EXAM: LUMBAR SPINE - COMPLETE 4+ VIEW COMPARISON:  CT abdomen pelvis of 05/15/2017 FINDINGS: The lumbar vertebrae are in normal alignment. Intervertebral disc spaces appear normal with the exception of L1-2 where there is loss of disc space and sclerosis with spurring consistent with degenerative disc disease. No compression deformity is seen. The SI joints are corticated. IMPRESSION: No acute abnormality.  Degenerative disc disease at L1-2. Electronically Signed   By: Dwyane Dee M.D.   On: 06/12/2017 15:25   Dg Sacrum/coccyx  Result Date: 06/12/2017 CLINICAL DATA:  Larey Seat today with pain in the low back EXAM: SACRUM AND COCCYX - 2+ VIEW COMPARISON:  CT abdomen and pelvis of 05/15/2017 FINDINGS: The sacrococcygeal elements are in normal alignment. No acute fracture is seen. The pelvic rami are intact. The SI joints appear corticated. The sacral foramina appear corticated. IMPRESSION: No acute abnormality. Electronically Signed   By: Dwyane Dee M.D.   On: 06/12/2017 15:23   Ct Head Wo Contrast  Result Date: 06/12/2017 CLINICAL DATA:  Larey Seat this morning, neck stiffness, history hypertension, smoking, bipolar disorder, CHF, COPD, type I diabetes mellitus,  stroke, stage III chronic kidney disease, COPD EXAM: CT HEAD WITHOUT CONTRAST CT CERVICAL SPINE WITHOUT CONTRAST TECHNIQUE: Multidetector CT imaging of the head and cervical spine was performed following the standard protocol without intravenous contrast. Multiplanar CT image reconstructions of the cervical spine were also generated. COMPARISON:  CT head 05/09/2017, CT cervical spine 12/11/2016 FINDINGS: CT HEAD FINDINGS Brain: Generalized atrophy. Normal ventricular morphology. No midline shift or mass effect. Small vessel chronic ischemic changes of deep cerebral white matter. Small old lacunar infarct LEFT thalamus. No intracranial hemorrhage, mass lesion, evidence of acute infarction, or extra-axial fluid collection. Vascular: Atherosclerotic calcifications of the internal carotid arteries at the skullbase. Skull: Intact Sinuses/Orbits: Clear Other: N/A CT CERVICAL SPINE FINDINGS Alignment: Minimal anterolisthesis C4-C5. Remaining alignment normal Skull base and vertebrae: Skullbase intact. Prior anterior fusion C5-C6 with incorporated bone plug. Chronic anterior height loss C7, stable. Vertebral body heights otherwise maintained without fracture or bone destruction. Facet degenerative changes bilaterally. Calcification of the anterior longitudinal ligament. Soft tissues and spinal canal: Prevertebral soft tissues normal thickness Disc levels: Disc space narrowing especially C6-C7 and C7-T1. Calcification of posterior longitudinal ligament at C2-C3. Upper chest: BILATERAL pleural effusions at the lung apices greater on RIGHT. Other: N/A IMPRESSION: Atrophy with small vessel chronic ischemic changes of deep cerebral white matter. Old lacunar infarct LEFT thalamus. No acute intracranial abnormalities. Degenerative disc and facet disease changes of the cervical spine. Prior anterior fusion C5-C6. Chronic anterior height loss C7 unchanged. No acute cervical spine abnormalities. Electronically Signed   By: Ulyses Southward M.D.   On: 06/12/2017 15:22   Ct Cervical Spine Wo Contrast  Result Date: 06/12/2017 CLINICAL DATA:  Larey Seat this morning, neck stiffness, history hypertension, smoking, bipolar disorder, CHF, COPD, type I diabetes mellitus, stroke, stage III chronic kidney disease, COPD EXAM: CT  HEAD WITHOUT CONTRAST CT CERVICAL SPINE WITHOUT CONTRAST TECHNIQUE: Multidetector CT imaging of the head and cervical spine was performed following the standard protocol without intravenous contrast. Multiplanar CT image reconstructions of the cervical spine were also generated. COMPARISON:  CT head 05/09/2017, CT cervical spine 12/11/2016 FINDINGS: CT HEAD FINDINGS Brain: Generalized atrophy. Normal ventricular morphology. No midline shift or mass effect. Small vessel chronic ischemic changes of deep cerebral white matter. Small old lacunar infarct LEFT thalamus. No intracranial hemorrhage, mass lesion, evidence of acute infarction, or extra-axial fluid collection. Vascular: Atherosclerotic calcifications of the internal carotid arteries at the skullbase. Skull: Intact Sinuses/Orbits: Clear Other: N/A CT CERVICAL SPINE FINDINGS Alignment: Minimal anterolisthesis C4-C5. Remaining alignment normal Skull base and vertebrae: Skullbase intact. Prior anterior fusion C5-C6 with incorporated bone plug. Chronic anterior height loss C7, stable. Vertebral body heights otherwise maintained without fracture or bone destruction. Facet degenerative changes bilaterally. Calcification of the anterior longitudinal ligament. Soft tissues and spinal canal: Prevertebral soft tissues normal thickness Disc levels: Disc space narrowing especially C6-C7 and C7-T1. Calcification of posterior longitudinal ligament at C2-C3. Upper chest: BILATERAL pleural effusions at the lung apices greater on RIGHT. Other: N/A IMPRESSION: Atrophy with small vessel chronic ischemic changes of deep cerebral white matter. Old lacunar infarct LEFT thalamus. No acute intracranial  abnormalities. Degenerative disc and facet disease changes of the cervical spine. Prior anterior fusion C5-C6. Chronic anterior height loss C7 unchanged. No acute cervical spine abnormalities. Electronically Signed   By: Ulyses Southward M.D.   On: 06/12/2017 15:22    EKG: Independently reviewed.  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 Nephrotic syndrome causing anasarca - 1. PT/OT eval and treat 2. Likely to rehab for strengthening 3. Continue home meds at the moment 4. No role for ACEi nor ARB according to Dr. Hyman Hopes last month 5. Call nephrology in AM for further recs 6. Certainly needs nephrology follow up at this point as outpatient given progression to CKD stage 4 (likely also needs vein mapping, etc). 2. CKD stage 4 - see above 3. DM2 - 1. Continue home lantus 8 units QHS 2. Novolog 3 TID mealtime 3. Sensitive scale SSI AC/HS 4. HLD - continue statin 5. Hypothyroidism - 1. Checking TSH 2. Looked better last month than it had been (down to 6, but had been running over 100). 3. Continue synthroid.  DVT prophylaxis: Heparin [place] Code Status: Full Family Communication: No family in room Disposition Plan: To SNF / inpatient rehab Consults called: SW Admission status: Place in 26   Hillary Bow. DO [facility] Pager [phone]  If 7AM-7PM, please contact day team taking care of patient [email or web address] Password [email or web address]  06/12/2017, 9:44 PM

## 2017-06-13 DIAGNOSIS — R601 Generalized edema: Secondary | ICD-10-CM | POA: Diagnosis not present

## 2017-06-13 DIAGNOSIS — N049 Nephrotic syndrome with unspecified morphologic changes: Secondary | ICD-10-CM | POA: Diagnosis not present

## 2017-06-13 DIAGNOSIS — E440 Moderate protein-calorie malnutrition: Secondary | ICD-10-CM | POA: Diagnosis present

## 2017-06-13 DIAGNOSIS — N184 Chronic kidney disease, stage 4 (severe): Secondary | ICD-10-CM | POA: Diagnosis not present

## 2017-06-13 DIAGNOSIS — E1122 Type 2 diabetes mellitus with diabetic chronic kidney disease: Secondary | ICD-10-CM | POA: Diagnosis not present

## 2017-06-13 DIAGNOSIS — E1121 Type 2 diabetes mellitus with diabetic nephropathy: Secondary | ICD-10-CM | POA: Diagnosis not present

## 2017-06-13 DIAGNOSIS — N179 Acute kidney failure, unspecified: Secondary | ICD-10-CM | POA: Diagnosis not present

## 2017-06-13 DIAGNOSIS — E1129 Type 2 diabetes mellitus with other diabetic kidney complication: Secondary | ICD-10-CM | POA: Diagnosis not present

## 2017-06-13 DIAGNOSIS — E039 Hypothyroidism, unspecified: Secondary | ICD-10-CM | POA: Diagnosis not present

## 2017-06-13 LAB — GLUCOSE, CAPILLARY
GLUCOSE-CAPILLARY: 126 mg/dL — AB (ref 65–99)
GLUCOSE-CAPILLARY: 237 mg/dL — AB (ref 65–99)
GLUCOSE-CAPILLARY: 463 mg/dL — AB (ref 65–99)
GLUCOSE-CAPILLARY: 573 mg/dL — AB (ref 65–99)
GLUCOSE-CAPILLARY: 73 mg/dL (ref 65–99)
Glucose-Capillary: 320 mg/dL — ABNORMAL HIGH (ref 65–99)
Glucose-Capillary: 53 mg/dL — ABNORMAL LOW (ref 65–99)
Glucose-Capillary: 60 mg/dL — ABNORMAL LOW (ref 65–99)

## 2017-06-13 MED ORDER — GLUCOSE 40 % PO GEL
ORAL | Status: AC
  Administered 2017-06-13: 37.5 g
  Filled 2017-06-13: qty 1

## 2017-06-13 MED ORDER — PRO-STAT SUGAR FREE PO LIQD
30.0000 mL | Freq: Three times a day (TID) | ORAL | Status: DC
  Administered 2017-06-13 – 2017-06-19 (×16): 30 mL via ORAL
  Filled 2017-06-13 (×20): qty 30

## 2017-06-13 MED ORDER — NEPRO/CARBSTEADY PO LIQD
237.0000 mL | Freq: Every day | ORAL | Status: DC
  Administered 2017-06-13 – 2017-06-19 (×4): 237 mL via ORAL

## 2017-06-13 MED ORDER — LEVOTHYROXINE SODIUM 100 MCG PO TABS
200.0000 ug | ORAL_TABLET | Freq: Every day | ORAL | Status: DC
  Administered 2017-06-14 – 2017-06-20 (×7): 200 ug via ORAL
  Filled 2017-06-13 (×7): qty 2

## 2017-06-13 MED ORDER — HYDROCORTISONE 2.5 % RE CREA
TOPICAL_CREAM | Freq: Three times a day (TID) | RECTAL | Status: DC | PRN
  Administered 2017-06-13: 07:00:00 via RECTAL
  Filled 2017-06-13: qty 28.35

## 2017-06-13 MED ORDER — ENSURE ENLIVE PO LIQD
237.0000 mL | Freq: Two times a day (BID) | ORAL | Status: DC
  Administered 2017-06-13 (×2): 237 mL via ORAL

## 2017-06-13 MED ORDER — HYDRALAZINE HCL 25 MG PO TABS
25.0000 mg | ORAL_TABLET | Freq: Four times a day (QID) | ORAL | Status: DC
  Administered 2017-06-13 – 2017-06-14 (×3): 25 mg via ORAL
  Filled 2017-06-13 (×2): qty 1

## 2017-06-13 MED ORDER — DEXTROSE 50 % IV SOLN
INTRAVENOUS | Status: AC
  Filled 2017-06-13: qty 50

## 2017-06-13 MED ORDER — INSULIN ASPART 100 UNIT/ML ~~LOC~~ SOLN
6.0000 [IU] | Freq: Once | SUBCUTANEOUS | Status: AC
  Administered 2017-06-13: 6 [IU] via SUBCUTANEOUS

## 2017-06-13 MED ORDER — HYDRALAZINE HCL 20 MG/ML IJ SOLN: 10.0000 mg | Freq: Four times a day (QID) | INTRAMUSCULAR | Status: DC | PRN

## 2017-06-13 NOTE — Care Management Obs Status (Signed)
MEDICARE OBSERVATION STATUS NOTIFICATION   Patient Details  Name: Cassandra [name] MRN: [id number] Date of Birth: [birth year]   Medicare Observation Status Notification Given:  Yes    Lawerance SabalDebbie Josephina Melcher, RN 06/13/2017, 2:59 PM

## 2017-06-13 NOTE — Progress Notes (Signed)
Pt sleeping in bed.  OT attempting work with patient.

## 2017-06-13 NOTE — Progress Notes (Signed)
PT Cancellation Note  Patient Details Name: Cassandra [name] MRN: [id number] DOB: July 13, 1946   Cancelled Treatment:    Reason Eval/Treat Not Completed: Patient declined, no reason specified. Pt reports that she would like therapy to return in an hour. Pt will make an attempt as time allows.    Colin BroachSabra M. Nainoa Woldt PT, DPT  [phone]  06/13/2017, 10:09 AM

## 2017-06-13 NOTE — Progress Notes (Signed)
Initial Nutrition Assessment  DOCUMENTATION CODES:   Non-severe (moderate) malnutrition in context of chronic illness  INTERVENTION:   -D/c Ensure Enlive po BID, each supplement provides 350 kcal and 20 grams of protein -30 ml Prostat TID, each supplement provides 100 kcals and 15 grams protein -Nepro Shake po q HS, each supplement provides 425 kcal and 19 grams protein  NUTRITION DIAGNOSIS:   Malnutrition (Mild) related to chronic illness (CKD) as evidenced by mild depletion of body fat, moderate depletion of body fat, moderate to severe fluid accumulation, mild depletion of muscle mass, moderate depletions of muscle mass.  GOAL:   Patient will meet greater than or equal to 90% of their needs  MONITOR:   PO intake, Supplement acceptance, Labs, Weight trends, Skin, I & O's  REASON FOR ASSESSMENT:   Malnutrition Screening Tool    ASSESSMENT:   Cassandra [name] is a 71 y.o. female with medical history significant of brittle DM2, insulin dependent, multiple admits for DKA, CKD stage 3 (now appears progressed to stage 4).  Pt admitted with anasarca.   Spoke with physical therapist prior to visit, who reports that pt is very lethargic and does not engage much in conversation.   Spoke with nurse tech, who reports pt is "eating ok". However, noted pt only ate 2-3 bites of salad off lunch tray. Documented meal completion 0%. However, pt is accepting Ensure supplements per [facility].   Pt did not engage in conversation with RD, however, would nod to simple close ended questions. She shares that she has had a poor appetite PTA.   Reviewed wt hx. No wt loss noted, however, suspect edema is masking true weight loss.   Nutrition-Focused physical exam completed. Findings are mild to moderate fat depletion, mild to moderate muscle depletion, and mild to moderate edema. Suspect depletions may be greater than documented, related to edema.  Last Hgb A1c was 8.7 (05/09/17), which demonstrates poor  control. Noted home DM regimen is as follows: 8 units insulin glargine q HS and insulin lispro 3 units TID with meals per documented PTA medications. Pt currently receiving insulin aspart 5 units q HS as well as meal and correction coverage.   Labs reviewed: CBGS: 126-573 (trendng down).   Diet Order:  Diet Carb Modified Fluid consistency: Thin; Room service appropriate? Yes  Skin:  Reviewed, no issues  Last BM:  06/13/17  Height:   Ht Readings from Last 1 Encounters:  05/10/17 5\' 2"  (1.575 m)    Weight:   Wt Readings from Last 1 Encounters:  06/13/17 134 lb 6.4 oz (61 kg)    Ideal Body Weight:  50 kg  BMI:  Body mass index is 24.58 kg/m.  Estimated Nutritional Needs:   Kcal:  1650-1850  Protein:  80-95 grams  Fluid:  per MD  EDUCATION NEEDS:   Education needs no appropriate at this time  Cassandra [name] A. Cassandra [name], RD, LDN, CDE Pager: [phone] After hours Pager: [phone]

## 2017-06-13 NOTE — Progress Notes (Signed)
New Admission Note:   Arrival Method: stretcher Mental Orientation: alert and oriented Telemetry:no Assessment: Completed Skin:intact   See flow sheet   IV:nsl Pain:denies Tubes:none Safety Measures: Safety Fall Prevention Plan has been given, discussed and signed Admission: Completed [address] Orientation: Patient has been orientated to the room, unit and staff.  Family:  Orders have been reviewed and implemented. Will continue to monitor the patient. Call light has been placed within reach and bed alarm has been activated.   Clement Sayresathie Edwardo Wojnarowski, RN Phone number: [phone]

## 2017-06-13 NOTE — Progress Notes (Signed)
Paged Dr. Selena BattenKim advised low CBG 53 and on Scheduled and ss insulin.  T/O Dr. Selena BattenKim give 1 amp  D50.

## 2017-06-13 NOTE — Progress Notes (Signed)
PT Cancellation Note  Patient Details Name: Merril AbbeBillie A Bulthuis MRN: [id number] DOB: 01-12-1946   Cancelled Treatment:    Reason Eval/Treat Not Completed: Patient declined, no reason specified. Pt reports that she continues to be too worn out to participate in therapy. Requests PT check back later or tomorrow.    Colin BroachSabra M. Berel Najjar PT, DPT  [phone]  06/13/2017, 11:33 AM

## 2017-06-13 NOTE — Evaluation (Signed)
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  Clinical Impression  Pt presents with the above diagnosis and below deficits for therapy evaluation. Upon entering the room, pt was side lying in recliner and requires Max A to adjust in recliner before sitting up. Pt requires Mod A for sit to stand and stand pivot transfer from chair to bed. Pt will benefit from continued acute rehab services in order to address the below deficits prior to discharge and will require SNF at discharge due to current mobility.     Follow Up Recommendations SNF;Supervision/Assistance - 24 hour    Equipment Recommendations  None recommended by PT    Recommendations for Other Services       Precautions / Restrictions Precautions Precautions: Fall Restrictions Weight Bearing Restrictions: No      Mobility  Bed Mobility Overal bed mobility: Needs Assistance Bed Mobility: Sit to Sidelying Rolling: Min assist Sidelying to sit: Max assist     Sit to sidelying: Min assist General bed mobility comments: Pt able to lay on her side from EOB, cues to move over in bed in order to place rail  Transfers Overall transfer level: Needs assistance Equipment used: 1 person hand held assist Transfers: Sit to/from [employer]Stand;Stand Pivot Transfers Sit to Stand: Min assist Stand pivot transfers: Mod assist       General transfer comment: Mod A to maneuver throughout transfer and Min A to stand from recliner. Requries readjusting in recliner in order to sit upright  Ambulation/Gait                Stairs            Wheelchair Mobility    Modified Rankin (Stroke Patients Only)        Balance Overall balance assessment: Needs assistance Sitting-balance support: Feet supported;Bilateral upper extremity supported Sitting balance-Leahy Scale: Fair     Standing balance support: Single extremity supported Standing balance-Leahy Scale: Poor                               Pertinent Vitals/Pain Pain Assessment: No/denies pain    Home Living Family/patient expects to be discharged to:: Private residence Living Arrangements: Spouse/significant other Available Help at Discharge: Family;Available PRN/intermittently Type of Home: Apartment Home Access: Level entry     Home Layout: One level Home Equipment: Wheelchair - [employer] Corporationmanual;Walker - 2 wheels;Shower seat      Prior Function Level of Independence: Needs assistance   Gait / Transfers Assistance Needed: uses RW  ADL's / Homemaking Assistance Needed: pt reports she was performing bathing/dressing without assistance.         Hand Dominance   Dominant Hand: Right    Extremity/Trunk Assessment   Upper Extremity Assessment Upper Extremity Assessment: Defer to OT evaluation    Lower Extremity Assessment Lower Extremity Assessment: Generalized weakness    Cervical / Trunk Assessment Cervical / Trunk Assessment: Kyphotic  Communication   Communication: No difficulties  Cognition Arousal/Alertness: Lethargic Behavior During Therapy: Flat affect Overall Cognitive Status: No family/caregiver present to determine baseline cognitive functioning  General Comments: A&O x4, follows one step commands consistently but very lethargic      General Comments      Exercises     Assessment/Plan    PT Assessment Patient needs continued PT services  PT Problem List Decreased activity tolerance;Decreased balance;Decreased mobility;Decreased knowledge of use of DME       PT Treatment Interventions DME instruction;Gait training;Functional mobility  training;Therapeutic activities;Therapeutic exercise;Balance training    PT Goals (Current goals can be found in the Care Plan section)  Acute Rehab PT Goals Patient Stated Goal: none stated PT Goal Formulation: Patient unable to participate in goal setting Time For Goal Achievement: 06/20/17 Potential to Achieve Goals: Fair    Frequency Min 2X/week   Barriers to discharge Decreased caregiver support      Co-evaluation               AM-PAC PT "6 Clicks" Daily Activity  Outcome Measure Difficulty turning over in bed (including adjusting bedclothes, sheets and blankets)?: Total Difficulty moving from lying on back to sitting on the side of the bed? : Total Difficulty sitting down on and standing up from a chair with arms (e.g., wheelchair, bedside commode, etc,.)?: Total Help needed moving to and from a bed to chair (including a wheelchair)?: A Lot Help needed walking in hospital room?: A Lot Help needed climbing 3-5 steps with a railing? : Total 6 Click Score: 8    End of Session Equipment Utilized During Treatment: Gait belt Activity Tolerance: Patient limited by lethargy Patient left: in bed;with call bell/phone within reach;with bed alarm set Nurse Communication: Mobility status PT Visit Diagnosis: Unsteadiness on feet (R26.81);Muscle weakness (generalized) (M62.81)    Time: 4098-1191 PT Time Calculation (min) (ACUTE ONLY): 10 min   Charges:   PT Evaluation $PT Eval Moderate Complexity: 1 Procedure     PT G Codes:   PT G-Codes **NOT FOR INPATIENT CLASS** Functional Assessment Tool Used: AM-PAC 6 Clicks Basic Mobility;Clinical judgement Functional Limitation: Mobility: Walking and moving around Mobility: Walking and Moving Around Current Status (Y7829): At least 80 percent but less than 100 percent impaired, limited or restricted Mobility: Walking and Moving Around Goal Status [phone]): At least 20 percent but less than 40 percent impaired, limited or restricted     [name] PT, DPT  [phone]   Roxy Manns 06/13/2017, 4:14 PM

## 2017-06-13 NOTE — Evaluation (Signed)
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   Clinical Impression   Pt reports she was managing BADL independently PTA. Currently pt requiring max assist for stand pivot transfer and max-total assist for ADL. Pt presenting with lethargy, impaired balance, generalized weakness, incontinence, and decreased activity tolerance impacting her independence and safety with ADL and functional mobility. Recommending SNF for follow up to maximize independence and safety with ADL and functional mobility prior to return home with family. Pt would benefit from continued skilled OT to address established goals.    Follow Up Recommendations  SNF;Supervision/Assistance - 24 hour    Equipment Recommendations  Other (comment) (TBD at next venue)    Recommendations for Other Services PT consult     Precautions / Restrictions Precautions Precautions: Fall Restrictions Weight Bearing Restrictions: No      Mobility Bed Mobility Overal bed mobility: Needs Assistance Bed Mobility: Rolling;Sidelying to Sit Rolling: Min assist Sidelying to sit: Max assist       General bed mobility comments: Cues for hand placement and sequencing. Assist for trunk elevation to sitting and scooting hips out to EOB  Transfers Overall transfer level: Needs assistance Equipment used: Rolling walker (2 wheeled) Transfers: Sit to/from [employer]Stand;Stand Pivot Transfers Sit to Stand: Min assist;+2 physical assistance Stand pivot transfers: Max assist       General transfer comment: Max multimodal cues thorughout. Pt lethargic and not stepping feet for stand pivot, therefore provided max assist to  safely complete transfer    Balance Overall balance assessment: Needs assistance Sitting-balance support: Feet supported;Bilateral upper extremity supported Sitting balance-Leahy Scale: Fair     Standing balance support: Bilateral upper extremity supported Standing balance-Leahy Scale: Poor                             ADL either performed or assessed with clinical judgement   ADL Overall ADL's : Needs assistance/impaired Eating/Feeding: Set up;Sitting   Grooming: Minimal assistance;Sitting   Upper Body Bathing: Maximal assistance;Bed level   Lower Body Bathing: Total assistance;Sit to/from stand   Upper Body Dressing : Moderate assistance;Sitting   Lower Body Dressing: Maximal assistance;Sit to/from stand   Toilet Transfer: Maximal assistance;Stand-pivot;RW;Cueing for sequencing Toilet Transfer Details (indicate cue type and reason): Pt not stepping toward chair for stand pivot therefore provided max assist with max multimodal cues to facilitate safe transfer Toileting- Clothing Manipulation and Hygiene: Total assistance;Sit to/from stand Toileting - ArchitectClothing Manipulation Details (indicate cue type and reason): RN tech assisting with peri care     Functional mobility during ADLs: Maximal assistance;Rolling walker (for stand pivot) General ADL Comments: Pt incontinent of bowel and bladder upon arrival     Vision         Perception     Praxis      Pertinent Vitals/Pain Pain Assessment: No/denies pain     Hand Dominance Right   Extremity/Trunk Assessment Upper Extremity Assessment Upper Extremity Assessment: Generalized weakness   Lower Extremity Assessment Lower Extremity Assessment: Defer to PT evaluation   Cervical / Trunk Assessment Cervical / Trunk Assessment: Kyphotic   Communication Communication Communication: No difficulties   Cognition Arousal/Alertness: Lethargic Behavior During Therapy: Flat affect Overall Cognitive  Status: No  family/caregiver present to determine baseline cognitive functioning                                 General Comments: A&O x4, follows one step commands consistently but very lethargic   General Comments       Exercises     Shoulder Instructions      Home Living Family/patient expects to be discharged to:: Private residence Living Arrangements: Spouse/significant other Available Help at Discharge: Family;Available PRN/intermittently Type of Home: Apartment Home Access: Level entry     Home Layout: One level     Bathroom Shower/Tub: Chief Strategy Officer: Standard     Home Equipment: Wheelchair - [employer] - 2 wheels;Shower seat          Prior Functioning/Environment Level of Independence: Needs assistance  Gait / Transfers Assistance Needed: uses RW ADL's / Homemaking Assistance Needed: pt reports she was performing bathing/dressing without assistance.             OT Problem List: Decreased strength;Decreased activity tolerance;Impaired balance (sitting and/or standing);Decreased safety awareness;Decreased knowledge of use of DME or AE      OT Treatment/Interventions: Self-care/ADL training;Therapeutic exercise;Energy conservation;DME and/or AE instruction;Therapeutic activities;Patient/family education;Balance training    OT Goals(Current goals can be found in the care plan section) Acute Rehab OT Goals Patient Stated Goal: none stated OT Goal Formulation: With patient Time For Goal Achievement: 06/27/17 Potential to Achieve Goals: Good ADL Goals Pt Will Perform Grooming: with supervision;sitting (x3 tasks) Pt Will Perform Upper Body Bathing: with supervision;sitting Pt Will Perform Lower Body Bathing: with min assist;sit to/from stand Pt Will Transfer to Toilet: with min assist;ambulating;bedside commode (over toilet) Pt Will Perform Toileting - Clothing Manipulation and hygiene: with min assist;sit to/from stand  OT  Frequency: Min 2X/week   Barriers to D/C: Decreased caregiver support          Co-evaluation              AM-PAC PT "6 Clicks" Daily Activity     Outcome Measure Help from another person eating meals?: A Little Help from another person taking care of personal grooming?: A Lot Help from another person toileting, which includes using toliet, bedpan, or urinal?: A Lot Help from another person bathing (including washing, rinsing, drying)?: A Lot Help from another person to put on and taking off regular upper body clothing?: A Lot Help from another person to put on and taking off regular lower body clothing?: Total 6 Click Score: 12   End of Session Equipment Utilized During Treatment: Rolling walker Nurse Communication: Mobility status;Other (comment) Manufacturing engineer assisting throughout)  Activity Tolerance: Patient tolerated treatment well;Patient limited by lethargy Patient left: in chair;with call bell/phone within reach;with chair alarm set  OT Visit Diagnosis: Unsteadiness on feet (R26.81);Other abnormalities of gait and mobility (R26.89);History of falling (Z91.81);Muscle weakness (generalized) (M62.81)                Time: 1411-1430 OT Time Calculation (min): 19 min Charges:  OT General Charges $OT Visit: 1 Procedure OT Evaluation $OT Eval Moderate Complexity: 1 Procedure G-Codes: OT G-codes **NOT FOR INPATIENT CLASS** Functional Assessment Tool Used: AM-PAC 6 Clicks Daily Activity Functional Limitation: Self care Self Care Current Status (Z6109): At least 60 percent but less than 80 percent impaired, limited or restricted Self Care Goal Status (U0454): At least 20 percent but less than 40 percent impaired, limited or  restricted   Fredric Mare A. Brett Albino, M.S., OTR/L Pager: [phone]  Gaye Alken 06/13/2017, 2:52 PM

## 2017-06-13 NOTE — Care Management Note (Signed)
Case Management Note  Patient Details  Name: Merril AbbeBillie A Petrosky MRN: [id number] Date of Birth: 1946/04/21  Subjective/Objective:                 Spoke to patient at the bedside. [facility]. Went home and is readmitted with fall at home. Patient lives at home with spouse. Has RW and BSC. PT rec SNF.    Action/Plan: CM and CSW following for dispo.   Expected Discharge Date:  06/14/17               Expected Discharge Plan:  Skilled Nursing Facility  In-House Referral:     Discharge planning Services  CM Consult  Post Acute Care Choice:    Choice offered to:     DME Arranged:    DME Agency:     HH Arranged:    HH Agency:     Status of Service:  In process, will continue to follow  If discussed at Long Length of Stay Meetings, dates discussed:    Additional Comments:  Lawerance SabalDebbie Islam Eichinger, RN 06/13/2017, 4:49 PM

## 2017-06-13 NOTE — Progress Notes (Signed)
Patient ID: Cassandra [name], female   DOB: 22-Feb-1946, 71 y.o.   MRN: [id number]                                                                PROGRESS NOTE                                                                                                                                                                                                             Patient Demographics:    Cassandra [name], is a 71 y.o. female, DOB - 29-Oct-1946, WUJ:[id number]  Admit date - 06/12/2017   Admitting Physician Hillary Bow, DO  Outpatient Primary MD for the patient is Henrine Screws, MD  LOS - 0  Outpatient Specialists:  Chief Complaint  Patient presents with  . Congestive Heart Failure  . Fall       Brief Narrative   71 y.o. female with medical history significant of brittle DM2, insulin dependent, multiple admits for DKA, CKD stage 3 (now appears progressed to stage 4).  Patient does not have a history of CHF, 2D echo last month demonstrated normal EF 65-70%.  Rather her severe peripheral edema and Anasarca is due to albumins of 1.8 and nephrotic syndrome also the opinion of Dr. Hyman Hopes on consult note last month.  Patient was recommended to go to SNF last month at discharge but elected to go home instead.  She has had worsening generalized weakness at home, worsening peripheral edema and is now unable to be cared for by family and home health at home she says.    Subjective:    Cassandra [name] today has states her edema is about the same.  , No headache, No chest pain, No abdominal pain - No Nausea, No new weakness tingling or numbness, No Cough - SOB.    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  Hold lasix Check urine sodium, urine creatinine Check cmp in am  Nephrotic syndrome causing anasarca, and hypothyroidism, and protein calorie malnutrition Protein/creat ratio Urine sodium Spep, upep No role for ACE or ARF per Dr. Hyman Hopes last month Nephrology consult  Edema STOP Gabapentin STOP Amlodipine  Dm2 fsbs ac and qhs,  iss  Hypoglycemia bs 54 Stop Lantus  Hypoglycemia ISS  Hypothyroidism Increase synthroid to 200 mcg po qday  Hyperlipidemia Cont statin  Hypertension Cont carvedilol D/c amlodipine => can cause edema  Start hydralazine    DVT prophylaxis: Heparin [place] Code Status: Full Family Communication: No family in room Disposition Plan: To SNF / inpatient rehab Consults called: SW, nephrology, PT/OT Admission status: Place in obs     Lab Results  Component Value Date   PLT 299 06/12/2017     Anti-infectives    None        Objective:   Vitals:   06/13/17 0022 06/13/17 0414 06/13/17 0846 06/13/17 0932  BP: (!) 172/69 (!) 151/61 (!) 163/93 (!) 145/53  Pulse: 92 85 79 82  Resp: 18 17  18   Temp: 98.5 F (36.9 C) 98.9 F (37.2 C)  98.8 F (37.1 C)  TempSrc: Oral Oral  Oral  SpO2: 98% 94%  95%  Weight: 61 kg (134 lb 6.4 oz)       Wt Readings from Last 3 Encounters:  06/13/17 61 kg (134 lb 6.4 oz)  05/14/17 55.8 kg (123 lb 0.3 oz)  03/12/17 53.7 kg (118 lb 6.2 oz)     Intake/Output Summary (Last 24 hours) at 06/13/17 1538 Last data filed at 06/13/17 1610  Gross per 24 hour  Intake                0 ml  Output                0 ml  Net                0 ml     Physical Exam  Awake Alert, Oriented X 3, No new F.N deficits, Normal affect [place].AT,PERRAL Supple Neck,No JVD, No cervical lymphadenopathy appriciated.  Symmetrical Chest wall movement, Good air movement bilaterally, CTAB RRR,No Gallops,Rubs or new Murmurs, No Parasternal Heave +ve B.Sounds, Abd Soft, No tenderness, No  organomegaly appriciated, No rebound - guarding or rigidity. No Cyanosis, Clubbing or edema, No new Rash or bruise      Data Review:    CBC  Recent Labs Lab 06/12/17 1553  WBC 9.7  HGB 9.8*  HCT 30.6*  PLT 299  MCV 84.8  MCH 27.1  MCHC 32.0  RDW 14.1    Chemistries   Recent Labs Lab 06/12/17 1553 06/12/17 2050  NA 134*  --   K 3.7  --   CL 104  --   CO2 21*  --   GLUCOSE 362*  --   BUN 56*  --   CREATININE 2.74*  --   CALCIUM 7.7*  --   AST  --  13*  ALT  --  11*  ALKPHOS  --  72  BILITOT  --  0.7   ------------------------------------------------------------------------------------------------------------------ No results for input(s): CHOL, HDL, LDLCALC, TRIG, CHOLHDL, LDLDIRECT in the last 72 hours.  Lab Results  Component Value Date   HGBA1C 8.7 (H) 05/09/2017   ------------------------------------------------------------------------------------------------------------------  Recent Labs  06/12/17 2204  TSH 9.065*   ------------------------------------------------------------------------------------------------------------------ No results for input(s): VITAMINB12, FOLATE, FERRITIN, TIBC, IRON, RETICCTPCT in the last 72 hours.  Coagulation profile No results for input(s): INR, PROTIME in the last 168 hours.  No results for input(s): DDIMER in the last 72 hours.  Cardiac Enzymes No results for input(s): CKMB, TROPONINI, MYOGLOBIN in the last 168 hours.  Invalid input(s): CK ------------------------------------------------------------------------------------------------------------------    Component Value Date/Time   BNP 511.6 (H) 06/12/2017 1553    Inpatient Medications  Scheduled Meds: . amLODipine  5 mg Oral Daily  . atorvastatin  20 mg Oral q1800  . carvedilol  3.125 mg Oral BID WC  . escitalopram  20 mg Oral Daily  . feeding supplement (ENSURE ENLIVE)  237 mL Oral BID BM  . furosemide  60 mg Oral Daily  . gabapentin  100 mg  Oral TID  . heparin  5,000 Units Subcutaneous Q8H  . insulin aspart  0-5 Units Subcutaneous QHS  . insulin aspart  0-9 Units Subcutaneous TID WC  . insulin aspart  3 Units Subcutaneous TID WC  . insulin glargine  8 Units Subcutaneous Q2200  . iron polysaccharides  150 mg Oral BID  . levothyroxine  175 mcg Oral QAC breakfast  . metoCLOPramide  5 mg Oral TID AC   Continuous Infusions: PRN Meds:.acetaminophen **OR** acetaminophen, hydrocortisone, ondansetron **OR** ondansetron (ZOFRAN) IV  Micro Results No results found for this or any previous visit (from the past 240 hour(s)).  Radiology Reports Ct Abdomen Pelvis Wo Contrast  Result Date: 05/15/2017 CLINICAL DATA:  Abdominal pain. Chronic renal failure and chronic congestive heart failure EXAM: CT ABDOMEN AND PELVIS WITHOUT CONTRAST TECHNIQUE: Multidetector CT imaging of the abdomen and pelvis was performed following the standard protocol without IV contrast. Oral contrast was administered. COMPARISON:  January 04, 2017 FINDINGS: Lower chest: There are bilateral pleural effusions, larger on the right than on the left. There is mild consolidation in the right base posteriorly, likely due to compressive atelectasis. There are foci of coronary artery calcification. Visualized pericardium is not appreciably thickened. Hepatobiliary: No focal liver lesions are evident on this noncontrast enhanced study. Gallbladder is absent. There is no appreciable biliary duct dilatation. Pancreas: There is no pancreatic mass or pancreatic inflammatory focus. Spleen: No splenic lesions are evident. Adrenals/Urinary Tract: Adrenals appear unremarkable bilaterally. Kidneys bilaterally show no evident mass or hydronephrosis on either side. There is no evident renal or ureteral calculi on either side. There are small phleboliths in the right pelvis, present previously, there are near but felt to be separate from the right ureter. The urinary bladder is midline with wall  thickness within normal limits. A small amount of air is noted within the urinary bladder. Stomach/Bowel: Rectum is distended with stool. There is mild rectal wall thickening. There is minimal perirectal stranding in the soft tissues. There is also fairly extensive stool throughout sigmoid colon without wall thickening in the sigmoid colon. Elsewhere, there is no appreciable bowel wall thickening. There is no bowel obstruction. No free air or portal venous air. Vascular/Lymphatic: There are foci of vascular calcification in the aorta and right common iliac artery. No aneurysm evident. Major mesenteric vessels appear patent on this noncontrast enhanced study. There is also calcification in the visualized profunda and superficial femoral arteries. There is no evident adenopathy in the abdomen or pelvis. Reproductive: Uterus is absent.  There is no evident pelvic mass. Other: Appendix absent. There is moderate ascites throughout the abdomen and pelvis. No abscess is evident in the abdomen or pelvis. There is soft tissue edema consistent with generalized anasarca. Musculoskeletal: There are multiple sclerotic foci in bone, likely due to chronic renal failure/ secondary hyperparathyroidism. There is degenerative change throughout the lumbar  spine. There are no lytic or destructive bone lesions.1 no intramuscular lesions are evident. IMPRESSION: There is diffuse anasarca with bilateral pleural effusions, larger on the right than the left. These findings are likely due to a combination of chronic renal failure and chronic congestive heart failure. Moderate ascites, possibly due to combination of chronic congestive heart failure and chronic renal failure. No abscess evident in the abdomen or pelvis. No bowel obstruction. Focal air in the urinary bladder. This finding could be due to recent instrumentation. If patient has not had recent instrumentation, gas-forming infectious organism within the urine must be of concern.  There is no appreciable hydronephrosis. No renal or ureteral calculus on either side. Wall thickening in the rectum with distention of rectum with stool. There is mild stranding in the perirectal fat. Suspect a degree of stercoral proctitis. Vascular calcification/aortoiliac and femoral artery atherosclerosis. There are foci of coronary artery calcification. Bony structures show sclerotic changes indicative of chronic renal failure/secondary hyperparathyroidism. Gallbladder, appendix, and uterus absent. Electronically Signed   By: Bretta BangWilliam  Woodruff III M.D.   On: 05/15/2017 14:13   Dg Chest 2 View  Result Date: 06/12/2017 CLINICAL DATA:  Worsening acute on chronic anasarca. Weakness for the past week. EXAM: CHEST  2 VIEW COMPARISON:  05/09/2017. FINDINGS: Normal sized heart. Clear lungs with normal vascularity. Mild increase in size of previously demonstrated small bilateral pleural effusions, with a probable subpulmonic component on the right. Thoracic spine degenerative changes. IMPRESSION: Mild increase in size of small bilateral pleural effusions. Electronically Signed   By: Beckie SaltsSteven  Reid M.D.   On: 06/12/2017 21:19   Dg Lumbar Spine Complete  Result Date: 06/12/2017 CLINICAL DATA:  Larey SeatFell today with pain in the low back EXAM: LUMBAR SPINE - COMPLETE 4+ VIEW COMPARISON:  CT abdomen pelvis of 05/15/2017 FINDINGS: The lumbar vertebrae are in normal alignment. Intervertebral disc spaces appear normal with the exception of L1-2 where there is loss of disc space and sclerosis with spurring consistent with degenerative disc disease. No compression deformity is seen. The SI joints are corticated. IMPRESSION: No acute abnormality.  Degenerative disc disease at L1-2. Electronically Signed   By: Dwyane DeePaul  Barry M.D.   On: 06/12/2017 15:25   Dg Sacrum/coccyx  Result Date: 06/12/2017 CLINICAL DATA:  Larey SeatFell today with pain in the low back EXAM: SACRUM AND COCCYX - 2+ VIEW COMPARISON:  CT abdomen and pelvis of 05/15/2017  FINDINGS: The sacrococcygeal elements are in normal alignment. No acute fracture is seen. The pelvic rami are intact. The SI joints appear corticated. The sacral foramina appear corticated. IMPRESSION: No acute abnormality. Electronically Signed   By: Dwyane DeePaul  Barry M.D.   On: 06/12/2017 15:23   Ct Head Wo Contrast  Result Date: 06/12/2017 CLINICAL DATA:  Larey SeatFell this morning, neck stiffness, history hypertension, smoking, bipolar disorder, CHF, COPD, type I diabetes mellitus, stroke, stage III chronic kidney disease, COPD EXAM: CT HEAD WITHOUT CONTRAST CT CERVICAL SPINE WITHOUT CONTRAST TECHNIQUE: Multidetector CT imaging of the head and cervical spine was performed following the standard protocol without intravenous contrast. Multiplanar CT image reconstructions of the cervical spine were also generated. COMPARISON:  CT head 05/09/2017, CT cervical spine 12/11/2016 FINDINGS: CT HEAD FINDINGS Brain: Generalized atrophy. Normal ventricular morphology. No midline shift or mass effect. Small vessel chronic ischemic changes of deep cerebral white matter. Small old lacunar infarct LEFT thalamus. No intracranial hemorrhage, mass lesion, evidence of acute infarction, or extra-axial fluid collection. Vascular: Atherosclerotic calcifications of the internal carotid arteries at the skullbase. Skull:  Intact Sinuses/Orbits: Clear Other: N/A CT CERVICAL SPINE FINDINGS Alignment: Minimal anterolisthesis C4-C5. Remaining alignment normal Skull base and vertebrae: Skullbase intact. Prior anterior fusion C5-C6 with incorporated bone plug. Chronic anterior height loss C7, stable. Vertebral body heights otherwise maintained without fracture or bone destruction. Facet degenerative changes bilaterally. Calcification of the anterior longitudinal ligament. Soft tissues and spinal canal: Prevertebral soft tissues normal thickness Disc levels: Disc space narrowing especially C6-C7 and C7-T1. Calcification of posterior longitudinal ligament  at C2-C3. Upper chest: BILATERAL pleural effusions at the lung apices greater on RIGHT. Other: N/A IMPRESSION: Atrophy with small vessel chronic ischemic changes of deep cerebral white matter. Old lacunar infarct LEFT thalamus. No acute intracranial abnormalities. Degenerative disc and facet disease changes of the cervical spine. Prior anterior fusion C5-C6. Chronic anterior height loss C7 unchanged. No acute cervical spine abnormalities. Electronically Signed   By: Ulyses Southward M.D.   On: 06/12/2017 15:22   Ct Cervical Spine Wo Contrast  Result Date: 06/12/2017 CLINICAL DATA:  Larey Seat this morning, neck stiffness, history hypertension, smoking, bipolar disorder, CHF, COPD, type I diabetes mellitus, stroke, stage III chronic kidney disease, COPD EXAM: CT HEAD WITHOUT CONTRAST CT CERVICAL SPINE WITHOUT CONTRAST TECHNIQUE: Multidetector CT imaging of the head and cervical spine was performed following the standard protocol without intravenous contrast. Multiplanar CT image reconstructions of the cervical spine were also generated. COMPARISON:  CT head 05/09/2017, CT cervical spine 12/11/2016 FINDINGS: CT HEAD FINDINGS Brain: Generalized atrophy. Normal ventricular morphology. No midline shift or mass effect. Small vessel chronic ischemic changes of deep cerebral white matter. Small old lacunar infarct LEFT thalamus. No intracranial hemorrhage, mass lesion, evidence of acute infarction, or extra-axial fluid collection. Vascular: Atherosclerotic calcifications of the internal carotid arteries at the skullbase. Skull: Intact Sinuses/Orbits: Clear Other: N/A CT CERVICAL SPINE FINDINGS Alignment: Minimal anterolisthesis C4-C5. Remaining alignment normal Skull base and vertebrae: Skullbase intact. Prior anterior fusion C5-C6 with incorporated bone plug. Chronic anterior height loss C7, stable. Vertebral body heights otherwise maintained without fracture or bone destruction. Facet degenerative changes bilaterally.  Calcification of the anterior longitudinal ligament. Soft tissues and spinal canal: Prevertebral soft tissues normal thickness Disc levels: Disc space narrowing especially C6-C7 and C7-T1. Calcification of posterior longitudinal ligament at C2-C3. Upper chest: BILATERAL pleural effusions at the lung apices greater on RIGHT. Other: N/A IMPRESSION: Atrophy with small vessel chronic ischemic changes of deep cerebral white matter. Old lacunar infarct LEFT thalamus. No acute intracranial abnormalities. Degenerative disc and facet disease changes of the cervical spine. Prior anterior fusion C5-C6. Chronic anterior height loss C7 unchanged. No acute cervical spine abnormalities. Electronically Signed   By: Ulyses Southward M.D.   On: 06/12/2017 15:22    Time Spent in minutes  30   Pearson Grippe M.D on 06/13/2017 at 3:38 PM  Between 7am to 7pm - Pager - [phone]  After 7pm go to [email or web address] - password [facility]  [facility] -  Office  [phone]

## 2017-06-14 ENCOUNTER — Inpatient Hospital Stay (HOSPITAL_COMMUNITY): Payer: Medicare Other

## 2017-06-14 DIAGNOSIS — R601 Generalized edema: Secondary | ICD-10-CM | POA: Diagnosis not present

## 2017-06-14 DIAGNOSIS — N184 Chronic kidney disease, stage 4 (severe): Secondary | ICD-10-CM | POA: Diagnosis present

## 2017-06-14 DIAGNOSIS — K219 Gastro-esophageal reflux disease without esophagitis: Secondary | ICD-10-CM | POA: Diagnosis present

## 2017-06-14 DIAGNOSIS — E1121 Type 2 diabetes mellitus with diabetic nephropathy: Secondary | ICD-10-CM | POA: Diagnosis not present

## 2017-06-14 DIAGNOSIS — Z794 Long term (current) use of insulin: Secondary | ICD-10-CM | POA: Diagnosis not present

## 2017-06-14 DIAGNOSIS — Z833 Family history of diabetes mellitus: Secondary | ICD-10-CM | POA: Diagnosis not present

## 2017-06-14 DIAGNOSIS — Z6820 Body mass index (BMI) 20.0-20.9, adult: Secondary | ICD-10-CM | POA: Diagnosis not present

## 2017-06-14 DIAGNOSIS — E039 Hypothyroidism, unspecified: Secondary | ICD-10-CM | POA: Diagnosis present

## 2017-06-14 DIAGNOSIS — D631 Anemia in chronic kidney disease: Secondary | ICD-10-CM | POA: Diagnosis present

## 2017-06-14 DIAGNOSIS — E7800 Pure hypercholesterolemia, unspecified: Secondary | ICD-10-CM | POA: Diagnosis present

## 2017-06-14 DIAGNOSIS — E43 Unspecified severe protein-calorie malnutrition: Secondary | ICD-10-CM | POA: Diagnosis present

## 2017-06-14 DIAGNOSIS — E440 Moderate protein-calorie malnutrition: Secondary | ICD-10-CM | POA: Diagnosis not present

## 2017-06-14 DIAGNOSIS — E11649 Type 2 diabetes mellitus with hypoglycemia without coma: Secondary | ICD-10-CM | POA: Diagnosis present

## 2017-06-14 DIAGNOSIS — E785 Hyperlipidemia, unspecified: Secondary | ICD-10-CM | POA: Diagnosis present

## 2017-06-14 DIAGNOSIS — I129 Hypertensive chronic kidney disease with stage 1 through stage 4 chronic kidney disease, or unspecified chronic kidney disease: Secondary | ICD-10-CM | POA: Diagnosis present

## 2017-06-14 DIAGNOSIS — E1122 Type 2 diabetes mellitus with diabetic chronic kidney disease: Secondary | ICD-10-CM | POA: Diagnosis not present

## 2017-06-14 DIAGNOSIS — A0472 Enterocolitis due to Clostridium difficile, not specified as recurrent: Secondary | ICD-10-CM | POA: Diagnosis present

## 2017-06-14 DIAGNOSIS — N179 Acute kidney failure, unspecified: Secondary | ICD-10-CM | POA: Diagnosis not present

## 2017-06-14 DIAGNOSIS — F319 Bipolar disorder, unspecified: Secondary | ICD-10-CM | POA: Diagnosis present

## 2017-06-14 DIAGNOSIS — Z90710 Acquired absence of both cervix and uterus: Secondary | ICD-10-CM | POA: Diagnosis not present

## 2017-06-14 DIAGNOSIS — E1129 Type 2 diabetes mellitus with other diabetic kidney complication: Secondary | ICD-10-CM | POA: Diagnosis not present

## 2017-06-14 DIAGNOSIS — N049 Nephrotic syndrome with unspecified morphologic changes: Secondary | ICD-10-CM | POA: Diagnosis not present

## 2017-06-14 DIAGNOSIS — J449 Chronic obstructive pulmonary disease, unspecified: Secondary | ICD-10-CM | POA: Diagnosis present

## 2017-06-14 DIAGNOSIS — Z8673 Personal history of transient ischemic attack (TIA), and cerebral infarction without residual deficits: Secondary | ICD-10-CM | POA: Diagnosis not present

## 2017-06-14 DIAGNOSIS — E784 Other hyperlipidemia: Secondary | ICD-10-CM | POA: Diagnosis not present

## 2017-06-14 DIAGNOSIS — E1165 Type 2 diabetes mellitus with hyperglycemia: Secondary | ICD-10-CM | POA: Diagnosis not present

## 2017-06-14 LAB — CBC
HCT: 33.4 % — ABNORMAL LOW (ref 36.0–46.0)
Hemoglobin: 10.8 g/dL — ABNORMAL LOW (ref 12.0–15.0)
MCH: 27.8 pg (ref 26.0–34.0)
MCHC: 32.3 g/dL (ref 30.0–36.0)
MCV: 85.9 fL (ref 78.0–100.0)
PLATELETS: 308 10*3/uL (ref 150–400)
RBC: 3.89 MIL/uL (ref 3.87–5.11)
RDW: 14.6 % (ref 11.5–15.5)
WBC: 7.8 10*3/uL (ref 4.0–10.5)

## 2017-06-14 LAB — PROTEIN ELECTROPHORESIS, SERUM
A/G Ratio: 0.7 (ref 0.7–1.7)
ALPHA-2-GLOBULIN: 1.1 g/dL — AB (ref 0.4–1.0)
Albumin ELP: 1.8 g/dL — ABNORMAL LOW (ref 2.9–4.4)
Alpha-1-Globulin: 0.3 g/dL (ref 0.0–0.4)
BETA GLOBULIN: 1 g/dL (ref 0.7–1.3)
GLOBULIN, TOTAL: 2.7 g/dL (ref 2.2–3.9)
Gamma Globulin: 0.3 g/dL — ABNORMAL LOW (ref 0.4–1.8)
Total Protein ELP: 4.5 g/dL — ABNORMAL LOW (ref 6.0–8.5)

## 2017-06-14 LAB — COMPREHENSIVE METABOLIC PANEL
ALT: 11 U/L — ABNORMAL LOW (ref 14–54)
ANION GAP: 8 (ref 5–15)
AST: 16 U/L (ref 15–41)
Albumin: 1.5 g/dL — ABNORMAL LOW (ref 3.5–5.0)
Alkaline Phosphatase: 78 U/L (ref 38–126)
BUN: 62 mg/dL — ABNORMAL HIGH (ref 6–20)
CHLORIDE: 104 mmol/L (ref 101–111)
CO2: 24 mmol/L (ref 22–32)
Calcium: 7.9 mg/dL — ABNORMAL LOW (ref 8.9–10.3)
Creatinine, Ser: 2.86 mg/dL — ABNORMAL HIGH (ref 0.44–1.00)
GFR calc non Af Amer: 16 mL/min — ABNORMAL LOW (ref >60)
GFR, EST AFRICAN AMERICAN: 18 mL/min — AB (ref >60)
Glucose, Bld: 253 mg/dL — ABNORMAL HIGH (ref 65–99)
Potassium: 3.5 mmol/L (ref 3.5–5.1)
SODIUM: 136 mmol/L (ref 135–145)
Total Bilirubin: 0.4 mg/dL (ref 0.3–1.2)
Total Protein: 4.6 g/dL — ABNORMAL LOW (ref 6.5–8.1)

## 2017-06-14 LAB — GLUCOSE, CAPILLARY
GLUCOSE-CAPILLARY: 214 mg/dL — AB (ref 65–99)
Glucose-Capillary: 245 mg/dL — ABNORMAL HIGH (ref 65–99)
Glucose-Capillary: 321 mg/dL — ABNORMAL HIGH (ref 65–99)

## 2017-06-14 MED ORDER — HYDRALAZINE HCL 50 MG PO TABS
50.0000 mg | ORAL_TABLET | Freq: Four times a day (QID) | ORAL | Status: DC
  Administered 2017-06-14 – 2017-06-15 (×3): 50 mg via ORAL
  Filled 2017-06-14 (×4): qty 1

## 2017-06-14 MED ORDER — DEXTROSE 5 % IV SOLN
1.0000 g | INTRAVENOUS | Status: AC
  Administered 2017-06-14 – 2017-06-16 (×3): 1 g via INTRAVENOUS
  Filled 2017-06-14 (×3): qty 10

## 2017-06-14 MED ORDER — FUROSEMIDE 10 MG/ML IJ SOLN
80.0000 mg | Freq: Three times a day (TID) | INTRAMUSCULAR | Status: DC
  Administered 2017-06-14 – 2017-06-15 (×3): 80 mg via INTRAVENOUS
  Filled 2017-06-14 (×3): qty 8

## 2017-06-14 MED ORDER — SODIUM CHLORIDE 0.9 % IV SOLN: INTRAVENOUS | Status: DC

## 2017-06-14 NOTE — Progress Notes (Signed)
LM w Dr Selena BattenKim for call back. Patient issued ABN yesterday, now refusing SNF. Will need to discuss disposition.

## 2017-06-14 NOTE — Consult Note (Addendum)
Renal Service Consult Note [facility]  Cassandra [name] 06/14/2017 Sol Blazing Requesting Physician: Dr Maudie Mercury   Reason for Consult:  CKD w/ neph syndrome HPI: The patient is a 71 y.o. year-old with hx of etoh abuse, DM1, recurrent DKA, anemia, hypoT4, resp failure, hx CVA's, Cdif, acute/ CKD, vol overload/ anasarca/ diast CHF. Admitted for c/o lower abd pain and diffuse swelling of arms and legs, fatigue and some SOB.  Asked to see for CKD / anasarca.   Pt grew up in [name], went to [employer] then [employer], graduated 12th grade , got married. Husband was in the [employer], she moved to [place] to be w/ her parents and worked as a Secretary/administrator.  Had two kids, one does insurance in [place] and the other sales in [place]. She lives in [place] w/ her 2nd husband.  +smoker, no etoh.    Main c/o at this time is fatigue, SOB w/ exertion, lower abd pain over bladder. No N/V, no diarrhea, no cough or severe CP/ SOB.  No fevers.     Had 11 admissions here in 2017 and this is the 5th admission in 2018 so far.    Date    Creat  eGFR 2013- 16 0.7- 1.5 2017  1.3- 4.17 Dec 2016 1.2- 1.7 29- 44  Mar 2018 1.8- 2.3 20- 10 May 2017 2.5- 3.0 15- 18  Jun 28  2.74  17  June 30 2.86  16    Old chart: 2014  - severe etoh abuse, HTN, anemia, low T4, DM1, etoh intox 2017 - DKA, VDRF, resp failure, HTN crisis, CVA bilat w L visual field defect, CKD, etoh abuse, HL, tobacco use, anemia - acute diast HF exac, acute L thalamus CVA - AKI, CKD 3, UTI, MD,  - DKA, CKD 3, coma, severe PCM - DKA - DKA, DM1 - UTI/ fever w bacteremia w GNR's, ^LFT's, severe hypoT4, low plts, anemia, depression - Cdif diarrhea, DM2, uncont hypoT4, fall at SNF - UTI, DM w/ low BS, uncont hypoT4 2018 - AMS d/t low BS, PNA; HCAP, hypoglycemia, asympt bacteriuria, anemia - sent from SNF w abd pain > dx was fecal impaction rx' lax/ stool softeners; severe ascites by CT started lasix per GI; bilat pleural effusions d/t  low alb. Anemia, DM2, low T4 - DKA1 with coma - DKA1 , acute / CKD, anasarca due to proteinuria, low T4 (TSH 6.2), HTN   Past Medical History  Past Medical History:  Diagnosis Date  . Allergic rhinitis   . Anemia   . Anxiety   . Arthritis    "mostly feet, hands" (10/17/2016)  . Benign hypertension with CKD (chronic kidney disease) stage III   . Benign paroxysmal positional vertigo   . Bipolar disorder ([place])   . Broken finger   . Broken shoulder   . Broken toes   . Cervicalgia   . CHF (congestive heart failure) ([place]) 07/2016  . CKD (chronic kidney disease) stage 3, GFR 30-59 ml/min   . COPD (chronic obstructive pulmonary disease) ([place])   . Depression   . Elevated liver enzymes Hep B/C neg 2014  . Fall at nursing home 10/15/2016  . GERD (gastroesophageal reflux disease)   . Glaucoma   . High cholesterol   . History of alcohol use   . History of blood transfusion 09/2016   "blood got really really low"  . Hypertension   . Hypothyroidism   . Interstitial cystitis    bladder stretched every  9 months  . Migraines    "pretty much qd" (10/17/2016)  . Pneumonia 12/2016  . Psoriasis   . Stroke ([place]) 05/16/2016   Left occipital and thalamic, right hippocampal  . Thyroid disease   . Tobacco use   . 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 Vitamin B12 deficiency    Past Surgical History  Past Surgical History:  Procedure Laterality Date  . ANTERIOR CERVICAL DECOMP/DISCECTOMY FUSION  ~ 2009  . APPENDECTOMY    . BACK SURGERY    . BLADDER SUSPENSION    . CATARACT EXTRACTION W/ INTRAOCULAR LENS  IMPLANT, BILATERAL Bilateral   . DILATION AND CURETTAGE OF UTERUS    . HERNIA REPAIR    . LAPAROSCOPIC CHOLECYSTECTOMY    . TONSILLECTOMY    . TUBAL LIGATION    . VAGINAL HYSTERECTOMY     with oophorectomy   Family History  Family History  Problem Relation Age of Onset  . Alcohol abuse Mother   . Arthritis Mother   . Asthma Mother   . Cancer Mother         colon cancer  . Hypertension Mother   . Migraines Mother   . Stroke Mother   . Lung disease Mother   . COPD Mother   . Diabetes Father   . Hypertension Father   . Heart disease Father   . Heart attack Father   . Heart disease Paternal Grandmother   . Diabetes Paternal Grandmother   . Stroke Paternal Grandmother   . Cancer Paternal Grandmother   . Diabetes Paternal Grandfather    Social History  reports that she quit smoking about 93 years ago. Her smoking use included Cigarettes. She has a 2.50 pack-year smoking history. She has never used smokeless tobacco. She reports that she does not drink alcohol or use drugs. Allergies  Allergies  Allergen Reactions  . Alprazolam Other (See Comments)    Family preference, for patient to not take med  . Percocet [Oxycodone-Acetaminophen] Other (See Comments)    Family preference, for patient to not take med  . Codeine Diarrhea and Nausea And Vomiting  . Doxycycline Diarrhea and Nausea And Vomiting  . Hydrocodone Nausea And Vomiting  . Omnicef [Cefdinir] Nausea Only and Other (See Comments)    Constipation, tolerated Zosyn  . Tramadol Other (See Comments)    Pt hallucinates when taking medication. Family request not to give medication to pt   . Augmentin [Amoxicillin-Pot Clavulanate] Hives and Rash    Has patient had a PCN reaction causing immediate rash, facial/tongue/throat swelling, SOB or lightheadedness with hypotension: Yes Has patient had a PCN reaction causing severe rash involving mucus membranes or skin necrosis: Yes Did a PCN reaction that required hospitalization No Did PCN reaction occurring within the last 10 years: Yes If all of the above answers are "NO", then may proceed with Cephalosporin use.  Pt states she has taken penicillin since, and was ok with it...  . Ciprofloxacin Hives    Tolerated LVQ in 12/2016   Home medications Prior to Admission medications   Medication Sig Start Date End Date Taking? Authorizing  Provider  amLODipine (NORVASC) 5 MG tablet Take 1 tablet (5 mg total) by mouth daily. 05/15/17  Yes Patrecia Pour, Christean Grief, MD  atorvastatin (LIPITOR) 10 MG tablet Take 1 tablet (10 mg total) by mouth daily at 6 PM. Patient taking differently: Take 20 mg by mouth daily at 6 PM.  12/27/16  Yes Ghimire, Henreitta Leber, MD  carvedilol (COREG) 3.125 MG tablet Take 1 tablet (3.125 mg total) by mouth 2 (two) times daily with a meal. 12/27/16  Yes Ghimire, Henreitta Leber, MD  escitalopram (LEXAPRO) 10 MG tablet Take 1.5 tablets (15 mg total) by mouth daily. Patient taking differently: Take 20 mg by mouth every morning.  12/27/16  Yes Ghimire, Henreitta Leber, MD  furosemide (LASIX) 20 MG tablet Take 3 tablets (60 mg total) by mouth daily. 05/14/17  Yes Patrecia Pour, Christean Grief, MD  gabapentin (NEURONTIN) 100 MG capsule Take 1 capsule (100 mg total) by mouth 3 (three) times daily. 05/14/17  Yes Patrecia Pour, Christean Grief, MD  Insulin Glargine (LANTUS) 100 UNIT/ML Solostar Pen Inject 8 Units into the skin daily at 10 pm. 05/14/17  Yes Patrecia Pour, Christean Grief, MD  iron polysaccharides (NIFEREX) 150 MG capsule Take 150 mg by mouth 2 (two) times daily.   Yes [provider]  levothyroxine (SYNTHROID, LEVOTHROID) 175 MCG tablet Take 1 tablet (175 mcg total) by mouth daily before breakfast. 01/07/17  Yes Sudini, Alveta Heimlich, MD  metoCLOPramide (REGLAN) 5 MG tablet Take 1 tablet (5 mg total) by mouth 3 (three) times daily before meals. 05/14/17  Yes Patrecia Pour, Christean Grief, MD  ondansetron (ZOFRAN) 4 MG tablet Take 4 mg by mouth every 8 (eight) hours as needed for nausea or vomiting.   Yes [provider]  insulin lispro (HUMALOG) 100 UNIT/ML injection Inject 0.03 mLs (3 Units total) into the skin 3 (three) times daily with meals. Patient taking differently: Inject 5-7 Units into the skin 3 (three) times daily as needed for high blood sugar.  03/12/17   Nita Sells, MD   Liver Function Tests  Recent Labs Lab 06/12/17 2050 06/14/17 0847   AST 13* 16  ALT 11* 11*  ALKPHOS 72 78  BILITOT 0.7 0.4  PROT 4.5* 4.6*  ALBUMIN 1.4* 1.5*   No results for input(s): LIPASE, AMYLASE in the last 168 hours. CBC  Recent Labs Lab 06/12/17 1553 06/14/17 0847  WBC 9.7 7.8  HGB 9.8* 10.8*  HCT 30.6* 33.4*  MCV 84.8 85.9  PLT 299 371   Basic Metabolic Panel  Recent Labs Lab 06/12/17 1553 06/14/17 0847  NA 134* 136  K 3.7 3.5  CL 104 104  CO2 21* 24  GLUCOSE 362* 253*  BUN 56* 62*  CREATININE 2.74* 2.86*  CALCIUM 7.7* 7.9*   Iron/TIBC/Ferritin/ %Sat    Component Value Date/Time   IRON 38 05/10/2017 2019   IRON 114 08/12/2014 1357   TIBC 189 (L) 05/10/2017 2019   TIBC 301 08/12/2014 1357   FERRITIN 231 01/05/2017 1350   FERRITIN 105 08/12/2014 1357   IRONPCTSAT 20 05/10/2017 2019   IRONPCTSAT 38 08/12/2014 1357    Vitals:   06/13/17 0932 06/13/17 2100 06/14/17 0411 06/14/17 0947  BP: (!) 145/53 129/66 (!) 178/69 (!) 140/56  Pulse: 82 82 82 76  Resp: _0 Temp: 98.8 F (37.1 C) 98.1 F (36.7 C) 98 F (36.7 C) 98.1 F (36.7 C)  TempSrc: Oral Oral Oral Oral  SpO2: 95%   96%  Weight:  60.9 kg (134 lb 4.2 oz)     Exam Gen chron ill , pale, pleasant, tired looking No rash, cyanosis or gangrene Sclera anicteric, throat clear  No jvd or bruits Chest clear bilat to the bases RRR no MRG Abd soft ntnd no mass or ascites +bs, tender lower mid abd GU defer MS no joint effusions or deformity Ext diffuse 2-3+ pitting edema or  UE's and lower extremities / no wounds or ulcers Neuro is alert, Ox 3 , nf, no asterixis, gen'd weakness   UA 6/28 > many bact, >300 prot, 5.0, 0-5 rbc, TNTC wbc's CXR 6/28 > small bilat pleural effusions Na 136 K 3.5 CO2 24  BUN 62  Cr 2.86  eGFR 17  Ca 7.9  Alb 1.5  LFT's ok WBC 7k  Hb 10.8  plt 308   TSH 9.06   Assess: 1  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 May need RRT if numbers get worse w diuresis or if not diuresing.  Looks sick , would keep her here until she starts to improve or has to go on dialysis as this won't be well managed in OP setting.   Kelly Splinter MD [employer] pager [phone]   06/14/2017, 2:28 PM

## 2017-06-14 NOTE — Progress Notes (Signed)
Will reassess in the afternoon to see if can go home w home PT vs SNF

## 2017-06-14 NOTE — Progress Notes (Signed)
Patient ID: Cassandra [name], female   DOB: [birth year], 71 y.o.   MRN: [id number]                                                                PROGRESS NOTE                                                                                                                                                                                                             Patient Demographics:    Cassandra [name], is a 71 y.o. female, DOB - [birth year], XBM:[id number]  Admit date - 06/12/2017   Admitting Physician Hillary Bow, DO  Outpatient Primary MD for the patient is Henrine Screws, MD  LOS - 0  Outpatient Specialists:     Chief Complaint  Patient presents with  . Congestive Heart Failure  . Fall       Brief Narrative  71 y.o.femalewith medical history significant of brittle DM2, insulin dependent, multiple admits for DKA, CKD stage 3 (now appears progressed to stage 4).  Patient does not have a history of CHF, 2D echo last month demonstrated normal EF 65-70%. Rather her severe peripheral edema and Anasarca is due to albumins of 1.8 and nephrotic syndrome also the opinion of Dr. Hyman Hopes on consult note last month.  Patient was recommended to go to SNF last month at discharge but elected to go home instead. She has had worsening generalized weakness at home, worsening peripheral edema and is now unable to be cared for by family and home health at home she says.    Subjective:    Cassandra [name] today has is still swollen.  But this has improved. Hypoglycemia yesterday bs 54, none yet today.   Pt is deconditioned.  Awaiting PT recommendations.     No headache, No chest pain, No abdominal pain - No Nausea, No new weakness tingling or numbness, No Cough - SOB.   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  Holding lasix Check urine sodium, urine creatinine Awaiting cmp this am  Nephrotic syndrome causing anasarca, and hypothyroidism, and protein calorie malnutrition Protein/creat ratio Urine sodium Spep, upep No role for ACE or ARF per Dr. Hyman Hopes last month Nephrology consult  Edema STOP Gabapentin STOP Amlodipine  Dm2 fsbs ac and qhs,  iss  Hypoglycemia bs 54 Stop Lantus  Hypoglycemia ISS  Hypothyroidism Increase synthroid to 200 mcg po qday 6/29  Hyperlipidemia Cont statin  Hypertension Cont carvedilol D/c amlodipine => can cause edema  Start hydralazine 6/29 Increase  Hydralazine to 50mg  po tid for better bp control today    DVT prophylaxis:Heparin [place] Code Status:Full Family Communication:No family in room Disposition Plan:To SNF / inpatient rehab Consults called:SW, nephrology, PT/OT Admission status:change to inpatient    Lab Results  Component Value Date   PLT 299 06/12/2017      Anti-infectives    None        Objective:   Vitals:   06/13/17 0846 06/13/17 0932 06/13/17 2100 06/14/17 0411  BP: (!) 163/93 (!) 145/53 129/66 (!) 178/69  Pulse: 79 82 82 82  Resp:  18 17 17   Temp:  98.8 F (37.1 C) 98.1 F (36.7 C) 98 F (36.7 C)  TempSrc:  Oral Oral Oral  SpO2:  95%    Weight:   60.9 kg (134 lb 4.2 oz)     Wt Readings from Last 3 Encounters:  06/13/17 60.9 kg (134 lb 4.2 oz)  05/14/17 55.8 kg (123 lb 0.3 oz)  03/12/17 53.7 kg (118 lb 6.2 oz)     Intake/Output Summary (Last 24 hours) at 06/14/17 0658 Last data filed at 06/14/17 0600  Gross per 24 hour  Intake                0 ml  Output              100 ml  Net             -100 ml     Physical Exam Very frail elderly appearing  lady Awake Alert, Oriented X 3, No new F.N deficits, Normal affect [place].AT,PERRAL Supple Neck,No JVD, No cervical lymphadenopathy appriciated.  Symmetrical Chest wall movement, Good air movement bilaterally, CTAB RRR,No Gallops,Rubs or new Murmurs, No Parasternal Heave +ve B.Sounds, Abd Soft, No tenderness, No organomegaly appriciated, No rebound - guarding or rigidity. No Cyanosis, Clubbing + edema , anasarca     Data Review:    CBC  Recent Labs Lab 06/12/17 1553  WBC 9.7  HGB 9.8*  HCT 30.6*  PLT 299  MCV 84.8  MCH 27.1  MCHC 32.0  RDW 14.1    Chemistries   Recent Labs Lab 06/12/17 1553 06/12/17 2050  NA 134*  --   K 3.7  --   CL 104  --   CO2 21*  --   GLUCOSE 362*  --   BUN 56*  --   CREATININE 2.74*  --   CALCIUM 7.7*  --   AST  --  13*  ALT  --  11*  ALKPHOS  --  72  BILITOT  --  0.7   ------------------------------------------------------------------------------------------------------------------ No results for input(s): CHOL, HDL, LDLCALC, TRIG, CHOLHDL, LDLDIRECT in the last 72 hours.  Lab Results  Component Value Date   HGBA1C 8.7 (H) 05/09/2017   ------------------------------------------------------------------------------------------------------------------  Recent Labs  06/12/17 2204  TSH 9.065*   ------------------------------------------------------------------------------------------------------------------  No results for input(s): VITAMINB12, FOLATE, FERRITIN, TIBC, IRON, RETICCTPCT in the last 72 hours.  Coagulation profile No results for input(s): INR, PROTIME in the last 168 hours.  No results for input(s): DDIMER in the last 72 hours.  Cardiac Enzymes No results for input(s): CKMB, TROPONINI, MYOGLOBIN in the last 168 hours.  Invalid input(s): CK ------------------------------------------------------------------------------------------------------------------    Component Value Date/Time   BNP 511.6 (H) 06/12/2017 1553     Inpatient Medications  Scheduled Meds: . atorvastatin  20 mg Oral q1800  . carvedilol  3.125 mg Oral BID WC  . escitalopram  20 mg Oral Daily  . feeding supplement (NEPRO CARB STEADY)  237 mL Oral QHS  . feeding supplement (PRO-STAT SUGAR FREE 64)  30 mL Oral TID  . heparin  5,000 Units Subcutaneous Q8H  . hydrALAZINE  25 mg Oral Q6H  . insulin aspart  0-5 Units Subcutaneous QHS  . insulin aspart  0-9 Units Subcutaneous TID WC  . iron polysaccharides  150 mg Oral BID  . levothyroxine  200 mcg Oral QAC breakfast  . metoCLOPramide  5 mg Oral TID AC   Continuous Infusions: PRN Meds:.acetaminophen **OR** acetaminophen, hydrALAZINE, hydrocortisone, ondansetron **OR** ondansetron (ZOFRAN) IV  Micro Results No results found for this or any previous visit (from the past 240 hour(s)).  Radiology Reports Ct Abdomen Pelvis Wo Contrast  Result Date: 05/15/2017 CLINICAL DATA:  Abdominal pain. Chronic renal failure and chronic congestive heart failure EXAM: CT ABDOMEN AND PELVIS WITHOUT CONTRAST TECHNIQUE: Multidetector CT imaging of the abdomen and pelvis was performed following the standard protocol without IV contrast. Oral contrast was administered. COMPARISON:  January 04, 2017 FINDINGS: Lower chest: There are bilateral pleural effusions, larger on the right than on the left. There is mild consolidation in the right base posteriorly, likely due to compressive atelectasis. There are foci of coronary artery calcification. Visualized pericardium is not appreciably thickened. Hepatobiliary: No focal liver lesions are evident on this noncontrast enhanced study. Gallbladder is absent. There is no appreciable biliary duct dilatation. Pancreas: There is no pancreatic mass or pancreatic inflammatory focus. Spleen: No splenic lesions are evident. Adrenals/Urinary Tract: Adrenals appear unremarkable bilaterally. Kidneys bilaterally show no evident mass or hydronephrosis on either side. There is no evident  renal or ureteral calculi on either side. There are small phleboliths in the right pelvis, present previously, there are near but felt to be separate from the right ureter. The urinary bladder is midline with wall thickness within normal limits. A small amount of air is noted within the urinary bladder. Stomach/Bowel: Rectum is distended with stool. There is mild rectal wall thickening. There is minimal perirectal stranding in the soft tissues. There is also fairly extensive stool throughout sigmoid colon without wall thickening in the sigmoid colon. Elsewhere, there is no appreciable bowel wall thickening. There is no bowel obstruction. No free air or portal venous air. Vascular/Lymphatic: There are foci of vascular calcification in the aorta and right common iliac artery. No aneurysm evident. Major mesenteric vessels appear patent on this noncontrast enhanced study. There is also calcification in the visualized profunda and superficial femoral arteries. There is no evident adenopathy in the abdomen or pelvis. Reproductive: Uterus is absent.  There is no evident pelvic mass. Other: Appendix absent. There is moderate ascites throughout the abdomen and pelvis. No abscess is evident in the abdomen or pelvis. There is soft tissue edema consistent with generalized anasarca. Musculoskeletal: There are multiple sclerotic foci in bone, likely due to chronic renal failure/  secondary hyperparathyroidism. There is degenerative change throughout the lumbar spine. There are no lytic or destructive bone lesions.1 no intramuscular lesions are evident. IMPRESSION: There is diffuse anasarca with bilateral pleural effusions, larger on the right than the left. These findings are likely due to a combination of chronic renal failure and chronic congestive heart failure. Moderate ascites, possibly due to combination of chronic congestive heart failure and chronic renal failure. No abscess evident in the abdomen or pelvis. No bowel  obstruction. Focal air in the urinary bladder. This finding could be due to recent instrumentation. If patient has not had recent instrumentation, gas-forming infectious organism within the urine must be of concern. There is no appreciable hydronephrosis. No renal or ureteral calculus on either side. Wall thickening in the rectum with distention of rectum with stool. There is mild stranding in the perirectal fat. Suspect a degree of stercoral proctitis. Vascular calcification/aortoiliac and femoral artery atherosclerosis. There are foci of coronary artery calcification. Bony structures show sclerotic changes indicative of chronic renal failure/secondary hyperparathyroidism. Gallbladder, appendix, and uterus absent. Electronically Signed   By: Bretta BangWilliam  Woodruff III M.D.   On: 05/15/2017 14:13   Dg Chest 2 View  Result Date: 06/12/2017 CLINICAL DATA:  Worsening acute on chronic anasarca. Weakness for the past week. EXAM: CHEST  2 VIEW COMPARISON:  05/09/2017. FINDINGS: Normal sized heart. Clear lungs with normal vascularity. Mild increase in size of previously demonstrated small bilateral pleural effusions, with a probable subpulmonic component on the right. Thoracic spine degenerative changes. IMPRESSION: Mild increase in size of small bilateral pleural effusions. Electronically Signed   By: Beckie SaltsSteven  Reid M.D.   On: 06/12/2017 21:19   Dg Lumbar Spine Complete  Result Date: 06/12/2017 CLINICAL DATA:  Larey SeatFell today with pain in the low back EXAM: LUMBAR SPINE - COMPLETE 4+ VIEW COMPARISON:  CT abdomen pelvis of 05/15/2017 FINDINGS: The lumbar vertebrae are in normal alignment. Intervertebral disc spaces appear normal with the exception of L1-2 where there is loss of disc space and sclerosis with spurring consistent with degenerative disc disease. No compression deformity is seen. The SI joints are corticated. IMPRESSION: No acute abnormality.  Degenerative disc disease at L1-2. Electronically Signed   By: Dwyane DeePaul   Barry M.D.   On: 06/12/2017 15:25   Dg Sacrum/coccyx  Result Date: 06/12/2017 CLINICAL DATA:  Larey SeatFell today with pain in the low back EXAM: SACRUM AND COCCYX - 2+ VIEW COMPARISON:  CT abdomen and pelvis of 05/15/2017 FINDINGS: The sacrococcygeal elements are in normal alignment. No acute fracture is seen. The pelvic rami are intact. The SI joints appear corticated. The sacral foramina appear corticated. IMPRESSION: No acute abnormality. Electronically Signed   By: Dwyane DeePaul  Barry M.D.   On: 06/12/2017 15:23   Ct Head Wo Contrast  Result Date: 06/12/2017 CLINICAL DATA:  Larey SeatFell this morning, neck stiffness, history hypertension, smoking, bipolar disorder, CHF, COPD, type I diabetes mellitus, stroke, stage III chronic kidney disease, COPD EXAM: CT HEAD WITHOUT CONTRAST CT CERVICAL SPINE WITHOUT CONTRAST TECHNIQUE: Multidetector CT imaging of the head and cervical spine was performed following the standard protocol without intravenous contrast. Multiplanar CT image reconstructions of the cervical spine were also generated. COMPARISON:  CT head 05/09/2017, CT cervical spine 12/11/2016 FINDINGS: CT HEAD FINDINGS Brain: Generalized atrophy. Normal ventricular morphology. No midline shift or mass effect. Small vessel chronic ischemic changes of deep cerebral white matter. Small old lacunar infarct LEFT thalamus. No intracranial hemorrhage, mass lesion, evidence of acute infarction, or extra-axial fluid collection. Vascular: Atherosclerotic calcifications  of the internal carotid arteries at the skullbase. Skull: Intact Sinuses/Orbits: Clear Other: N/A CT CERVICAL SPINE FINDINGS Alignment: Minimal anterolisthesis C4-C5. Remaining alignment normal Skull base and vertebrae: Skullbase intact. Prior anterior fusion C5-C6 with incorporated bone plug. Chronic anterior height loss C7, stable. Vertebral body heights otherwise maintained without fracture or bone destruction. Facet degenerative changes bilaterally. Calcification of the  anterior longitudinal ligament. Soft tissues and spinal canal: Prevertebral soft tissues normal thickness Disc levels: Disc space narrowing especially C6-C7 and C7-T1. Calcification of posterior longitudinal ligament at C2-C3. Upper chest: BILATERAL pleural effusions at the lung apices greater on RIGHT. Other: N/A IMPRESSION: Atrophy with small vessel chronic ischemic changes of deep cerebral white matter. Old lacunar infarct LEFT thalamus. No acute intracranial abnormalities. Degenerative disc and facet disease changes of the cervical spine. Prior anterior fusion C5-C6. Chronic anterior height loss C7 unchanged. No acute cervical spine abnormalities. Electronically Signed   By: Ulyses Southward M.D.   On: 06/12/2017 15:22   Ct Cervical Spine Wo Contrast  Result Date: 06/12/2017 CLINICAL DATA:  Larey Seat this morning, neck stiffness, history hypertension, smoking, bipolar disorder, CHF, COPD, type I diabetes mellitus, stroke, stage III chronic kidney disease, COPD EXAM: CT HEAD WITHOUT CONTRAST CT CERVICAL SPINE WITHOUT CONTRAST TECHNIQUE: Multidetector CT imaging of the head and cervical spine was performed following the standard protocol without intravenous contrast. Multiplanar CT image reconstructions of the cervical spine were also generated. COMPARISON:  CT head 05/09/2017, CT cervical spine 12/11/2016 FINDINGS: CT HEAD FINDINGS Brain: Generalized atrophy. Normal ventricular morphology. No midline shift or mass effect. Small vessel chronic ischemic changes of deep cerebral white matter. Small old lacunar infarct LEFT thalamus. No intracranial hemorrhage, mass lesion, evidence of acute infarction, or extra-axial fluid collection. Vascular: Atherosclerotic calcifications of the internal carotid arteries at the skullbase. Skull: Intact Sinuses/Orbits: Clear Other: N/A CT CERVICAL SPINE FINDINGS Alignment: Minimal anterolisthesis C4-C5. Remaining alignment normal Skull base and vertebrae: Skullbase intact. Prior  anterior fusion C5-C6 with incorporated bone plug. Chronic anterior height loss C7, stable. Vertebral body heights otherwise maintained without fracture or bone destruction. Facet degenerative changes bilaterally. Calcification of the anterior longitudinal ligament. Soft tissues and spinal canal: Prevertebral soft tissues normal thickness Disc levels: Disc space narrowing especially C6-C7 and C7-T1. Calcification of posterior longitudinal ligament at C2-C3. Upper chest: BILATERAL pleural effusions at the lung apices greater on RIGHT. Other: N/A IMPRESSION: Atrophy with small vessel chronic ischemic changes of deep cerebral white matter. Old lacunar infarct LEFT thalamus. No acute intracranial abnormalities. Degenerative disc and facet disease changes of the cervical spine. Prior anterior fusion C5-C6. Chronic anterior height loss C7 unchanged. No acute cervical spine abnormalities. Electronically Signed   By: Ulyses Southward M.D.   On: 06/12/2017 15:22    Time Spent in minutes  30   Pearson Grippe M.D on 06/14/2017 at 6:58 AM  Between 7am to 7pm - Pager - [phone]  After 7pm go to [email or web address] - password [facility]  [facility] -  Office  [phone]

## 2017-06-14 NOTE — Progress Notes (Signed)
Spoke with pt re: disposition.  Pt refusing SNF placement and [facility]. Pt has had home care in the past, but unable to recall the name of the company.  RNCM notified.  CSW signing off.

## 2017-06-15 DIAGNOSIS — E1129 Type 2 diabetes mellitus with other diabetic kidney complication: Secondary | ICD-10-CM

## 2017-06-15 LAB — GLUCOSE, CAPILLARY
GLUCOSE-CAPILLARY: 165 mg/dL — AB (ref 65–99)
GLUCOSE-CAPILLARY: 174 mg/dL — AB (ref 65–99)
GLUCOSE-CAPILLARY: 191 mg/dL — AB (ref 65–99)
GLUCOSE-CAPILLARY: 271 mg/dL — AB (ref 65–99)
Glucose-Capillary: 237 mg/dL — ABNORMAL HIGH (ref 65–99)

## 2017-06-15 LAB — C DIFFICILE QUICK SCREEN W PCR REFLEX
C DIFFICLE (CDIFF) ANTIGEN: POSITIVE — AB
C Diff toxin: NEGATIVE

## 2017-06-15 LAB — COMPREHENSIVE METABOLIC PANEL
ALBUMIN: 1.3 g/dL — AB (ref 3.5–5.0)
ALK PHOS: 66 U/L (ref 38–126)
ALT: 11 U/L — ABNORMAL LOW (ref 14–54)
AST: 12 U/L — AB (ref 15–41)
Anion gap: 8 (ref 5–15)
BILIRUBIN TOTAL: 0.3 mg/dL (ref 0.3–1.2)
BUN: 64 mg/dL — AB (ref 6–20)
CO2: 24 mmol/L (ref 22–32)
CREATININE: 2.96 mg/dL — AB (ref 0.44–1.00)
Calcium: 7.5 mg/dL — ABNORMAL LOW (ref 8.9–10.3)
Chloride: 104 mmol/L (ref 101–111)
GFR calc Af Amer: 18 mL/min — ABNORMAL LOW (ref >60)
GFR, EST NON AFRICAN AMERICAN: 15 mL/min — AB (ref >60)
GLUCOSE: 162 mg/dL — AB (ref 65–99)
POTASSIUM: 3.1 mmol/L — AB (ref 3.5–5.1)
Sodium: 136 mmol/L (ref 135–145)
TOTAL PROTEIN: 4.2 g/dL — AB (ref 6.5–8.1)

## 2017-06-15 LAB — CLOSTRIDIUM DIFFICILE BY PCR: Toxigenic C. Difficile by PCR: POSITIVE — AB

## 2017-06-15 MED ORDER — POTASSIUM CHLORIDE 10 MEQ/100ML IV SOLN
10.0000 meq | INTRAVENOUS | Status: AC
  Administered 2017-06-15 (×2): 10 meq via INTRAVENOUS
  Filled 2017-06-15 (×2): qty 100

## 2017-06-15 MED ORDER — LINAGLIPTIN 5 MG PO TABS
5.0000 mg | ORAL_TABLET | Freq: Every day | ORAL | Status: DC
  Administered 2017-06-15 – 2017-06-16 (×2): 5 mg via ORAL
  Filled 2017-06-15 (×2): qty 1

## 2017-06-15 MED ORDER — FUROSEMIDE 10 MG/ML IJ SOLN
120.0000 mg | Freq: Three times a day (TID) | INTRAVENOUS | Status: DC
  Administered 2017-06-15 – 2017-06-18 (×8): 120 mg via INTRAVENOUS
  Filled 2017-06-15: qty 10
  Filled 2017-06-15 (×3): qty 12
  Filled 2017-06-15 (×2): qty 10
  Filled 2017-06-15 (×2): qty 12
  Filled 2017-06-15 (×3): qty 10

## 2017-06-15 MED ORDER — POTASSIUM CHLORIDE CRYS ER 20 MEQ PO TBCR
20.0000 meq | EXTENDED_RELEASE_TABLET | Freq: Two times a day (BID) | ORAL | Status: AC
  Administered 2017-06-15 – 2017-06-16 (×3): 20 meq via ORAL
  Filled 2017-06-15 (×3): qty 1

## 2017-06-15 MED ORDER — VANCOMYCIN 50 MG/ML ORAL SOLUTION
125.0000 mg | Freq: Four times a day (QID) | ORAL | Status: DC
  Administered 2017-06-15 – 2017-06-20 (×21): 125 mg via ORAL
  Filled 2017-06-15 (×25): qty 2.5

## 2017-06-15 MED ORDER — HYDRALAZINE HCL 50 MG PO TABS
100.0000 mg | ORAL_TABLET | Freq: Three times a day (TID) | ORAL | Status: DC
  Administered 2017-06-15 – 2017-06-20 (×17): 100 mg via ORAL
  Filled 2017-06-15 (×17): qty 2

## 2017-06-15 NOTE — Progress Notes (Addendum)
Patient ID: [name], female   DOB: 01/23/1946, 71 y.o.   MRN: [id number]                                                                PROGRESS NOTE                                                                                                                                                                                                             Patient Demographics:    Cassandra [name], is a 71 y.o. female, DOB - 10-Oct-1946, WUJ:[id number]  Admit date - 06/12/2017   Admitting Physician Hillary Bow, DO  Outpatient Primary MD for the patient is Henrine Screws, MD  LOS - 1  Outpatient Specialists:     Chief Complaint  Patient presents with  . Congestive Heart Failure  . Fall       Brief Narrative      71 y.o.femalewith medical history significant of brittle DM2, insulin dependent, multiple admits for DKA, CKD stage 3 (now appears progressed to stage 4).  Patient does not have a history of CHF, 2D echo last month demonstrated normal EF 65-70%. Rather her severe peripheral edema and Anasarca is due to albumins of 1.8 and nephrotic syndrome also the opinion of Dr. Hyman Hopes on consult note last month.  Patient was recommended to go to SNF last month at discharge but elected to go home instead. She has had worsening generalized weakness at home, worsening peripheral edema and is now unable to be cared for by family and home health at home she says.   Subjective:    [name] today had some diarrhea yesterday. None today. C. Diff pending.  Pt feels like edema improved w IV lasix. Appreciate nephrology input.   Pt is willing to go to SNF for rehab for 2 weeks   No headache, No chest pain, No abdominal pain - No Nausea, No new weakness tingling or numbness, No Cough - SOB, dysuria, hematuria.    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  Nephrology following urine sodium, urine protein, urine creatinine pending Renal function slightly worse this am Check cmp tomorrow am  Nephrotic syndrome causing anasarca, and hypothyroidism, and protein calorie malnutrition Protein/creat ratio Urine sodium Spep, upep No role for ACE or ARF per Dr. Hyman Hopes last month Nephrology following, appreciate inpu   Edema STOP Gabapentin STOP Amlodipine  UTI Staff had difficulty with iv on d1/2 of stay Rocephin, pt tolerating  Dm2 fsbs ac and qhs,  iss  Hypoglycemia, 6/29 bs 54 Off Lantus, no further episodes of hypoglycemia Start tradjenta Cont SSI  Hypothyroidism cont synthroid to 200 mcg po qday 6/29  Hyperlipidemia Cont statin  Hypertension Cont carvedilol D/c amlodipine =>can cause edema  Start hydralazine 6/29 increase Hydralazine to 100mg  po tid for better bp control today    DVT prophylaxis:Heparin Buckner Code Status:Full Family Communication:No family in room Disposition Plan:To SNF / inpatient rehab, pt apparently willing to go now, Thanks Consults called:SW, nephrology, PT/OT Admission status:change to inpatient   Lab Results  Component Value Date   PLT 308 06/14/2017    Antibiotics  :  Rocephin  Anti-infectives    Start     Dose/Rate Route Frequency Ordered Stop   06/14/17 1600  cefTRIAXone (ROCEPHIN) 1 g in dextrose 5 % 50 mL IVPB     1 g 100 mL/hr over 30 Minutes Intravenous Every 24 hours 06/14/17 1521          Objective:   Vitals:   06/14/17 0411 06/14/17 0947 06/14/17 1708 06/14/17 2300  BP: (!) 178/69 (!) 140/56 (!) 171/74 (!) 176/71  Pulse: 82 76 81 79    Resp: 17 18 18 18   Temp: 98 F (36.7 C) 98.1 F (36.7 C) 98.1 F (36.7 C) 98.8 F (37.1 C)  TempSrc: Oral Oral Oral Oral  SpO2:  96% 95% 95%  Weight:    61.3 kg (135 lb 1.6 oz)    Wt Readings from Last 3 Encounters:  06/14/17 61.3 kg (135 lb 1.6 oz)  05/14/17 55.8 kg (123 lb 0.3 oz)  03/12/17 53.7 kg (118 lb 6.2 oz)     Intake/Output Summary (Last 24 hours) at 06/15/17 0708 Last data filed at 06/14/17 2000  Gross per 24 hour  Intake              530 ml  Output              400 ml  Net              130 ml     Physical Exam  Awake Alert, Oriented X 3, No new F.N deficits, Normal affect [place].AT,PERRAL Supple Neck,No JVD, No cervical lymphadenopathy appriciated.  Symmetrical Chest wall movement, Good air movement bilaterally, CTAB RRR,No Gallops,Rubs or new Murmurs, No Parasternal Heave +ve B.Sounds, Abd Soft, No tenderness, No organomegaly appriciated, No rebound - guarding or rigidity. No Cyanosis, Clubbing or edema, No new Rash or bruise     Data Review:    CBC  Recent Labs Lab 06/12/17 1553 06/14/17 0847  WBC 9.7 7.8  HGB 9.8* 10.8*  HCT 30.6* 33.4*  PLT 299 308  MCV 84.8 85.9  MCH 27.1 27.8  MCHC 32.0 32.3  RDW 14.1 14.6    Chemistries   Recent Labs Lab 06/12/17  1553 06/12/17 2050 06/14/17 0847 06/15/17 0419  NA 134*  --  136 136  K 3.7  --  3.5 3.1*  CL 104  --  104 104  CO2 21*  --  24 24  GLUCOSE 362*  --  253* 162*  BUN 56*  --  62* 64*  CREATININE 2.74*  --  2.86* 2.96*  CALCIUM 7.7*  --  7.9* 7.5*  AST  --  13* 16 12*  ALT  --  11* 11* 11*  ALKPHOS  --  72 78 66  BILITOT  --  0.7 0.4 0.3   ------------------------------------------------------------------------------------------------------------------ No results for input(s): CHOL, HDL, LDLCALC, TRIG, CHOLHDL, LDLDIRECT in the last 72 hours.  Lab Results  Component Value Date   HGBA1C 8.7 (H) 05/09/2017    ------------------------------------------------------------------------------------------------------------------  Recent Labs  06/12/17 2204  TSH 9.065*   ------------------------------------------------------------------------------------------------------------------ No results for input(s): VITAMINB12, FOLATE, FERRITIN, TIBC, IRON, RETICCTPCT in the last 72 hours.  Coagulation profile No results for input(s): INR, PROTIME in the last 168 hours.  No results for input(s): DDIMER in the last 72 hours.  Cardiac Enzymes No results for input(s): CKMB, TROPONINI, MYOGLOBIN in the last 168 hours.  Invalid input(s): CK ------------------------------------------------------------------------------------------------------------------    Component Value Date/Time   BNP 511.6 (H) 06/12/2017 1553    Inpatient Medications  Scheduled Meds: . atorvastatin  20 mg Oral q1800  . carvedilol  3.125 mg Oral BID WC  . escitalopram  20 mg Oral Daily  . feeding supplement (NEPRO CARB STEADY)  237 mL Oral QHS  . feeding supplement (PRO-STAT SUGAR FREE 64)  30 mL Oral TID  . furosemide  80 mg Intravenous Q8H  . heparin  5,000 Units Subcutaneous Q8H  . hydrALAZINE  100 mg Oral Q8H  . insulin aspart  0-5 Units Subcutaneous QHS  . insulin aspart  0-9 Units Subcutaneous TID WC  . iron polysaccharides  150 mg Oral BID  . levothyroxine  200 mcg Oral QAC breakfast  . metoCLOPramide  5 mg Oral TID AC  . potassium chloride  20 mEq Oral BID   Continuous Infusions: . cefTRIAXone (ROCEPHIN)  IV Stopped (06/14/17 1612)  . potassium chloride 10 mEq (06/15/17 0645)   PRN Meds:.acetaminophen **OR** acetaminophen, hydrALAZINE, hydrocortisone, ondansetron **OR** ondansetron (ZOFRAN) IV  Micro Results No results found for this or any previous visit (from the past 240 hour(s)).  Radiology Reports Dg Chest 2 View  Result Date: 06/12/2017 CLINICAL DATA:  Worsening acute on chronic anasarca. Weakness  for the past week. EXAM: CHEST  2 VIEW COMPARISON:  05/09/2017. FINDINGS: Normal sized heart. Clear lungs with normal vascularity. Mild increase in size of previously demonstrated small bilateral pleural effusions, with a probable subpulmonic component on the right. Thoracic spine degenerative changes. IMPRESSION: Mild increase in size of small bilateral pleural effusions. Electronically Signed   By: Beckie Salts M.D.   On: 06/12/2017 21:19   Dg Lumbar Spine Complete  Result Date: 06/12/2017 CLINICAL DATA:  Larey Seat today with pain in the low back EXAM: LUMBAR SPINE - COMPLETE 4+ VIEW COMPARISON:  CT abdomen pelvis of 05/15/2017 FINDINGS: The lumbar vertebrae are in normal alignment. Intervertebral disc spaces appear normal with the exception of L1-2 where there is loss of disc space and sclerosis with spurring consistent with degenerative disc disease. No compression deformity is seen. The SI joints are corticated. IMPRESSION: No acute abnormality.  Degenerative disc disease at L1-2. Electronically Signed   By: Dwyane Dee M.D.   On: 06/12/2017  15:25   Dg Sacrum/coccyx  Result Date: 06/12/2017 CLINICAL DATA:  Larey Seat today with pain in the low back EXAM: SACRUM AND COCCYX - 2+ VIEW COMPARISON:  CT abdomen and pelvis of 05/15/2017 FINDINGS: The sacrococcygeal elements are in normal alignment. No acute fracture is seen. The pelvic rami are intact. The SI joints appear corticated. The sacral foramina appear corticated. IMPRESSION: No acute abnormality. Electronically Signed   By: Dwyane Dee M.D.   On: 06/12/2017 15:23   Ct Head Wo Contrast  Result Date: 06/12/2017 CLINICAL DATA:  Larey Seat this morning, neck stiffness, history hypertension, smoking, bipolar disorder, CHF, COPD, type I diabetes mellitus, stroke, stage III chronic kidney disease, COPD EXAM: CT HEAD WITHOUT CONTRAST CT CERVICAL SPINE WITHOUT CONTRAST TECHNIQUE: Multidetector CT imaging of the head and cervical spine was performed following the standard  protocol without intravenous contrast. Multiplanar CT image reconstructions of the cervical spine were also generated. COMPARISON:  CT head 05/09/2017, CT cervical spine 12/11/2016 FINDINGS: CT HEAD FINDINGS Brain: Generalized atrophy. Normal ventricular morphology. No midline shift or mass effect. Small vessel chronic ischemic changes of deep cerebral white matter. Small old lacunar infarct LEFT thalamus. No intracranial hemorrhage, mass lesion, evidence of acute infarction, or extra-axial fluid collection. Vascular: Atherosclerotic calcifications of the internal carotid arteries at the skullbase. Skull: Intact Sinuses/Orbits: Clear Other: N/A CT CERVICAL SPINE FINDINGS Alignment: Minimal anterolisthesis C4-C5. Remaining alignment normal Skull base and vertebrae: Skullbase intact. Prior anterior fusion C5-C6 with incorporated bone plug. Chronic anterior height loss C7, stable. Vertebral body heights otherwise maintained without fracture or bone destruction. Facet degenerative changes bilaterally. Calcification of the anterior longitudinal ligament. Soft tissues and spinal canal: Prevertebral soft tissues normal thickness Disc levels: Disc space narrowing especially C6-C7 and C7-T1. Calcification of posterior longitudinal ligament at C2-C3. Upper chest: BILATERAL pleural effusions at the lung apices greater on RIGHT. Other: N/A IMPRESSION: Atrophy with small vessel chronic ischemic changes of deep cerebral white matter. Old lacunar infarct LEFT thalamus. No acute intracranial abnormalities. Degenerative disc and facet disease changes of the cervical spine. Prior anterior fusion C5-C6. Chronic anterior height loss C7 unchanged. No acute cervical spine abnormalities. Electronically Signed   By: Ulyses Southward M.D.   On: 06/12/2017 15:22   Ct Cervical Spine Wo Contrast  Result Date: 06/12/2017 CLINICAL DATA:  Larey Seat this morning, neck stiffness, history hypertension, smoking, bipolar disorder, CHF, COPD, type I diabetes  mellitus, stroke, stage III chronic kidney disease, COPD EXAM: CT HEAD WITHOUT CONTRAST CT CERVICAL SPINE WITHOUT CONTRAST TECHNIQUE: Multidetector CT imaging of the head and cervical spine was performed following the standard protocol without intravenous contrast. Multiplanar CT image reconstructions of the cervical spine were also generated. COMPARISON:  CT head 05/09/2017, CT cervical spine 12/11/2016 FINDINGS: CT HEAD FINDINGS Brain: Generalized atrophy. Normal ventricular morphology. No midline shift or mass effect. Small vessel chronic ischemic changes of deep cerebral white matter. Small old lacunar infarct LEFT thalamus. No intracranial hemorrhage, mass lesion, evidence of acute infarction, or extra-axial fluid collection. Vascular: Atherosclerotic calcifications of the internal carotid arteries at the skullbase. Skull: Intact Sinuses/Orbits: Clear Other: N/A CT CERVICAL SPINE FINDINGS Alignment: Minimal anterolisthesis C4-C5. Remaining alignment normal Skull base and vertebrae: Skullbase intact. Prior anterior fusion C5-C6 with incorporated bone plug. Chronic anterior height loss C7, stable. Vertebral body heights otherwise maintained without fracture or bone destruction. Facet degenerative changes bilaterally. Calcification of the anterior longitudinal ligament. Soft tissues and spinal canal: Prevertebral soft tissues normal thickness Disc levels: Disc space narrowing especially C6-C7 and C7-T1.  Calcification of posterior longitudinal ligament at C2-C3. Upper chest: BILATERAL pleural effusions at the lung apices greater on RIGHT. Other: N/A IMPRESSION: Atrophy with small vessel chronic ischemic changes of deep cerebral white matter. Old lacunar infarct LEFT thalamus. No acute intracranial abnormalities. Degenerative disc and facet disease changes of the cervical spine. Prior anterior fusion C5-C6. Chronic anterior height loss C7 unchanged. No acute cervical spine abnormalities. Electronically Signed   By:  Ulyses SouthwardMark  Boles M.D.   On: 06/12/2017 15:22   Dg Abd 2 Views  Result Date: 06/14/2017 CLINICAL DATA:  Suprapubic abdominal pain EXAM: ABDOMEN - 2 VIEW COMPARISON:  None. FINDINGS: Prior cholecystectomy. Gas throughout nondistended large and small bowel. No convincing evidence for obstruction. No free air organomegaly. IMPRESSION: Nonspecific bowel gas pattern without convincing evidence of obstruction. Electronically Signed   By: Charlett NoseKevin  Dover M.D.   On: 06/14/2017 16:27    Time Spent in minutes  30   Pearson GrippeJames Zarion Oliff M.D on 06/15/2017 at 7:08 AM  Between 7am to 7pm - Pager - [phone]  After 7pm go to [email or web address] - password [email or web address]  [facility] -  Office  [phone]

## 2017-06-15 NOTE — Progress Notes (Signed)
[facility] Isidoro DonningAlesia Jayke Caul RN CCM Case Mgmt phone [phone]

## 2017-06-15 NOTE — Progress Notes (Addendum)
[facility] Progress Note  Subjective: 1100 uop yesterday, no new c/o's.    Vitals:   06/14/17 0947 06/14/17 1708 06/14/17 2300 06/15/17 0808  BP: (!) 140/56 (!) 171/74 (!) 176/71 (!) 156/62  Pulse: 76 81 79 84  Resp: '18 18 18 18  '$ Temp: 98.1 F (36.7 C) 98.1 F (36.7 C) 98.8 F (37.1 C) 98.7 F (37.1 C)  TempSrc: Oral Oral Oral Oral  SpO2: 96% 95% 95% 96%  Weight:   61.3 kg (135 lb 1.6 oz)     Inpatient medications: . atorvastatin  20 mg Oral q1800  . carvedilol  3.125 mg Oral BID WC  . escitalopram  20 mg Oral Daily  . feeding supplement (NEPRO CARB STEADY)  237 mL Oral QHS  . feeding supplement (PRO-STAT SUGAR FREE 64)  30 mL Oral TID  . furosemide  80 mg Intravenous Q8H  . heparin  5,000 Units Subcutaneous Q8H  . hydrALAZINE  100 mg Oral Q8H  . insulin aspart  0-5 Units Subcutaneous QHS  . insulin aspart  0-9 Units Subcutaneous TID WC  . iron polysaccharides  150 mg Oral BID  . levothyroxine  200 mcg Oral QAC breakfast  . linagliptin  5 mg Oral Daily  . metoCLOPramide  5 mg Oral TID AC  . potassium chloride  20 mEq Oral BID   . cefTRIAXone (ROCEPHIN)  IV Stopped (06/14/17 1612)   acetaminophen **OR** acetaminophen, hydrALAZINE, hydrocortisone, ondansetron **OR** ondansetron (ZOFRAN) IV  Exam: Gen chron ill , pale, pleasant, tired No jvd or bruits Chest clear bilat to the bases RRR no MRG Abd soft ntnd  +bs, tender lower mid abd Ext diffuse musc wasting and diffuse 2-3+ edema x 4 ext Neuro  Ox 3 , nf, no asterixis, gen'd weakness   UA 6/28 > many bact, >300 prot, 5.0, 0-5 rbc, TNTC wbc's Renal US > during admit in May , 9- 10 cm kidneys w/o hydro CXR 6/28 > small bilat pleural effusions Na 136 K 3.5 CO2 24  BUN 62  Cr 2.86  eGFR 17  Ca 7.9  Alb 1.5  LFT's ok WBC 7k  Hb 10.8  plt 308   TSH 9.06   Assess: 1  CKD stage IV - 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 Has been seen by renal 2x in the hospital in the  past year but hasn't followed up in OP setting.  Pt says she is not good at keeping f/u appts.  Spoke with her 2 daughters on the phone who say that her social/ living/ home situation is "very, very bad". They want to know if she has capacity to make her own decisions. Will d/w primary.  3  Vol overload/ anasarca - clear CXR, lots of edema. Will ^ lasix IV. Creat will rise w/ diuresis.  4  Neph syndrome - had prot:creat ratio = 15 in May this year 5  HTN on coreg/ hydralazine/ lasix IV 6  Abd pain/ pyuria - on Rocephin for suspected UTI   Plan - as above   Kelly Splinter MD [facility] pager [phone]   06/15/2017, 12:37 PM    Recent Labs Lab 06/12/17 1553 06/14/17 0847 06/15/17 0419  NA 134* 136 136  K 3.7 3.5 3.1*  CL 104 104 104  CO2 21* 24 24  GLUCOSE 362* 253* 162*  BUN 56* 62* 64*  CREATININE 2.74* 2.86* 2.96*  CALCIUM 7.7* 7.9* 7.5*    Recent Labs Lab 06/12/17  2050 06/14/17 0847 06/15/17 0419  AST 13* 16 12*  ALT 11* 11* 11*  ALKPHOS 72 78 66  BILITOT 0.7 0.4 0.3  PROT 4.5* 4.6* 4.2*  ALBUMIN 1.4* 1.5* 1.3*    Recent Labs Lab 06/12/17 1553 06/14/17 0847  WBC 9.7 7.8  HGB 9.8* 10.8*  HCT 30.6* 33.4*  MCV 84.8 85.9  PLT 299 308   Iron/TIBC/Ferritin/ %Sat    Component Value Date/Time   IRON 38 05/10/2017 2019   IRON 114 08/12/2014 1357   TIBC 189 (L) 05/10/2017 2019   TIBC 301 08/12/2014 1357   FERRITIN 231 01/05/2017 1350   FERRITIN 105 08/12/2014 1357   IRONPCTSAT 20 05/10/2017 2019   IRONPCTSAT 38 08/12/2014 1357

## 2017-06-16 LAB — COMPREHENSIVE METABOLIC PANEL
ALT: 11 U/L — ABNORMAL LOW (ref 14–54)
ANION GAP: 15 (ref 5–15)
AST: 11 U/L — ABNORMAL LOW (ref 15–41)
Albumin: 1.5 g/dL — ABNORMAL LOW (ref 3.5–5.0)
Alkaline Phosphatase: 73 U/L (ref 38–126)
BUN: 65 mg/dL — ABNORMAL HIGH (ref 6–20)
CHLORIDE: 106 mmol/L (ref 101–111)
CO2: 16 mmol/L — AB (ref 22–32)
Calcium: 7.7 mg/dL — ABNORMAL LOW (ref 8.9–10.3)
Creatinine, Ser: 3.06 mg/dL — ABNORMAL HIGH (ref 0.44–1.00)
GFR, EST AFRICAN AMERICAN: 17 mL/min — AB (ref >60)
GFR, EST NON AFRICAN AMERICAN: 15 mL/min — AB (ref >60)
Glucose, Bld: 420 mg/dL — ABNORMAL HIGH (ref 65–99)
POTASSIUM: 4.2 mmol/L (ref 3.5–5.1)
SODIUM: 137 mmol/L (ref 135–145)
Total Bilirubin: 0.8 mg/dL (ref 0.3–1.2)
Total Protein: 4.7 g/dL — ABNORMAL LOW (ref 6.5–8.1)

## 2017-06-16 LAB — CBC
HEMATOCRIT: 33.7 % — AB (ref 36.0–46.0)
HEMOGLOBIN: 10.5 g/dL — AB (ref 12.0–15.0)
MCH: 27.3 pg (ref 26.0–34.0)
MCHC: 31.2 g/dL (ref 30.0–36.0)
MCV: 87.5 fL (ref 78.0–100.0)
PLATELETS: 295 10*3/uL (ref 150–400)
RBC: 3.85 MIL/uL — AB (ref 3.87–5.11)
RDW: 14.8 % (ref 11.5–15.5)
WBC: 8.8 10*3/uL (ref 4.0–10.5)

## 2017-06-16 LAB — GLUCOSE, CAPILLARY
GLUCOSE-CAPILLARY: 319 mg/dL — AB (ref 65–99)
GLUCOSE-CAPILLARY: 203 mg/dL — AB (ref 65–99)
Glucose-Capillary: 417 mg/dL — ABNORMAL HIGH (ref 65–99)
Glucose-Capillary: 315 mg/dL — ABNORMAL HIGH (ref 65–99)

## 2017-06-16 MED ORDER — INSULIN ASPART 100 UNIT/ML ~~LOC~~ SOLN
0.0000 [IU] | Freq: Three times a day (TID) | SUBCUTANEOUS | Status: DC
  Administered 2017-06-16: 4 [IU] via SUBCUTANEOUS
  Administered 2017-06-17: 1 [IU] via SUBCUTANEOUS
  Administered 2017-06-18: 2 [IU] via SUBCUTANEOUS
  Administered 2017-06-18: 1 [IU] via SUBCUTANEOUS
  Administered 2017-06-19 (×2): 3 [IU] via SUBCUTANEOUS
  Administered 2017-06-19: 4 [IU] via SUBCUTANEOUS
  Administered 2017-06-20: 1 [IU] via SUBCUTANEOUS
  Administered 2017-06-20: 3 [IU] via SUBCUTANEOUS
  Administered 2017-06-20: 1 [IU] via SUBCUTANEOUS

## 2017-06-16 MED ORDER — INSULIN ASPART 100 UNIT/ML ~~LOC~~ SOLN
2.0000 [IU] | Freq: Three times a day (TID) | SUBCUTANEOUS | Status: DC
  Administered 2017-06-16 – 2017-06-19 (×9): 2 [IU] via SUBCUTANEOUS

## 2017-06-16 MED ORDER — INSULIN ASPART 100 UNIT/ML ~~LOC~~ SOLN
0.0000 [IU] | Freq: Every day | SUBCUTANEOUS | Status: DC
  Administered 2017-06-16 – 2017-06-19 (×2): 2 [IU] via SUBCUTANEOUS

## 2017-06-16 MED ORDER — INSULIN GLARGINE 100 UNIT/ML ~~LOC~~ SOLN
7.0000 [IU] | Freq: Every day | SUBCUTANEOUS | Status: DC
  Administered 2017-06-16 – 2017-06-19 (×4): 7 [IU] via SUBCUTANEOUS
  Filled 2017-06-16 (×4): qty 0.07

## 2017-06-16 NOTE — Progress Notes (Addendum)
Inpatient Diabetes Program Recommendations  AACE/ADA: New Consensus Statement on Inpatient Glycemic Control (2015)  Target Ranges:  Prepandial:   less than 140 mg/dL      Peak postprandial:   less than 180 mg/dL (1-2 hours)      Critically ill patients:  140 - 180 mg/dL   Lab Results  Component Value Date   GLUCAP 315 (H) 06/16/2017   HGBA1C 8.7 (H) 05/09/2017    Review of Glycemic Control:  Results for Cassandra [name], Cassandra [name] (MRN [id number]) as of 06/16/2017 14:08  Ref. Range 06/15/2017 17:14 06/15/2017 20:41 06/16/2017 07:26 06/16/2017 12:21  Glucose-Capillary Latest Ref Range: 65 - 99 mg/dL 784174 (H) 696165 (H) 295417 (H) 315 (H)   Diabetes history: DM- "Brittle" according to MD notes Outpatient Diabetes medications: Lantus 8 units q HS, Humalog 5-7 units tid with meals Current orders for Inpatient glycemic control:  Novolog sensitive tid with meals and HS, Lantus 7 units daily, Tradjenta 5 mg daily  Inpatient Diabetes Program Recommendations:   Agree with restart of basal insulin. Consider reducing Novolog correction to custom scale. 151-200 mg/dL- 1 unit, 284-132201-250 mg/dL- 2 units, 440-102251-300 mg/dL- 3 units, [phone] mg/dL-4 units, 440-347351-400 mg/dL-5 units.  Also consider adding Novolog meal coverage 2 units tid with meals (hold if patient eats less than 50%).  Unsure if Jearld Leschradjenta will be helpful due to hx. of "insulin" dependent DM and past DKA admissions?   Thanks, Beryl MeagerJenny Kamara Allan, RN, BC-ADM Inpatient Diabetes Coordinator Pager [phone] (8a-5p)

## 2017-06-16 NOTE — Progress Notes (Signed)
Patient ID: Cassandra [name], female   DOB: November 23, 1946, 71 y.o.   MRN: [id number] S:Reports feeling nauseated this morning. O:BP (!) 186/76 (BP Location: Right Arm)   Pulse 99   Temp 98.6 F (37 C) (Oral)   Resp 18   Ht 5\' 4"  (1.626 m)   Wt 60.2 kg (132 lb 11.2 oz)   SpO2 94%   BMI 22.78 kg/m   Intake/Output Summary (Last 24 hours) at 06/16/17 0856 Last data filed at 06/16/17 0540  Gross per 24 hour  Intake              592 ml  Output              825 ml  Net             -233 ml   Intake/Output: I/O last 3 completed shifts: In: 812 [P.O.:600; IV Piggyback:212] Out: 4098 [[id number] [Urine:1675; Stool:1]  Intake/Output this shift:  No intake/output data recorded. Weight change: -1.089 kg (-2 lb 6.4 oz) Gen:WD WF in NAD CVS:no rub Resp:cta FAO:[id number]:benign3 Ext:1+ pitting edema   Recent Labs Lab 06/12/17 1553 06/12/17 2050 06/14/17 0847 06/15/17 0419  NA 134*  --  136 136  K 3.7  --  3.5 3.1*  CL 104  --  104 104  CO2 21*  --  24 24  GLUCOSE 362*  --  253* 162*  BUN 56*  --  62* 64*  CREATININE 2.74*  --  2.86* 2.96*  ALBUMIN  --  1.4* 1.5* 1.3*  CALCIUM 7.7*  --  7.9* 7.5*  AST  --  13* 16 12*  ALT  --  11* 11* 11*   Liver Function Tests:  Recent Labs Lab 06/12/17 2050 06/14/17 0847 06/15/17 0419  AST 13* 16 12*  ALT 11* 11* 11*  ALKPHOS 72 78 66  BILITOT 0.7 0.4 0.3  PROT 4.5* 4.6* 4.2*  ALBUMIN 1.4* 1.5* 1.3*   No results for input(s): LIPASE, AMYLASE in the last 168 hours. No results for input(s): AMMONIA in the last 168 hours. CBC:  Recent Labs Lab 06/12/17 1553 06/14/17 0847 06/16/17 0732  WBC 9.7 7.8 8.8  HGB 9.8* 10.8* 10.5*  HCT 30.6* 33.4* 33.7*  MCV 84.8 85.9 87.5  PLT 299 308 295   Cardiac Enzymes: No results for input(s): CKTOTAL, CKMB, CKMBINDEX, TROPONINI in the last 168 hours. CBG:  Recent Labs Lab 06/15/17 0757 06/15/17 1221 06/15/17 1714 06/15/17 2041 06/16/17 0726  GLUCAP 237* 271* 174* 165* 417*    Iron Studies: No results  for input(s): IRON, TIBC, TRANSFERRIN, FERRITIN in the last 72 hours. Studies/Results: Dg Abd 2 Views  Result Date: 06/14/2017 CLINICAL DATA:  Suprapubic abdominal pain EXAM: ABDOMEN - 2 VIEW COMPARISON:  None. FINDINGS: Prior cholecystectomy. Gas throughout nondistended large and small bowel. No convincing evidence for obstruction. No free air organomegaly. IMPRESSION: Nonspecific bowel gas pattern without convincing evidence of obstruction. Electronically Signed   By: Charlett NoseKevin  Dover M.D.   On: 06/14/2017 16:27   . atorvastatin  20 mg Oral q1800  . carvedilol  3.125 mg Oral BID WC  . escitalopram  20 mg Oral Daily  . feeding supplement (NEPRO CARB STEADY)  237 mL Oral QHS  . feeding supplement (PRO-STAT SUGAR FREE 64)  30 mL Oral TID  . heparin  5,000 Units Subcutaneous Q8H  . hydrALAZINE  100 mg Oral Q8H  . insulin aspart  0-5 Units Subcutaneous QHS  . insulin aspart  0-9 Units Subcutaneous TID WC  .  insulin glargine  7 Units Subcutaneous Daily  . iron polysaccharides  150 mg Oral BID  . levothyroxine  200 mcg Oral QAC breakfast  . linagliptin  5 mg Oral Daily  . metoCLOPramide  5 mg Oral TID AC  . potassium chloride  20 mEq Oral BID  . vancomycin  125 mg Oral QID    BMET    Component Value Date/Time   NA 136 06/15/2017 0419   NA 141 11/04/2016   NA 136 06/15/2014 1507   K 3.1 (L) 06/15/2017 0419   K 3.3 (L) 06/15/2014 1507   CL 104 06/15/2017 0419   CL 99 06/15/2014 1507   CO2 24 06/15/2017 0419   CO2 25 06/15/2014 1507   GLUCOSE 162 (H) 06/15/2017 0419   GLUCOSE 134 (H) 06/15/2014 1507   BUN 64 (H) 06/15/2017 0419   BUN 26 (A) 11/04/2016   BUN 16 06/15/2014 1507   CREATININE 2.96 (H) 06/15/2017 0419   CREATININE 1.17 06/15/2014 1507   CALCIUM 7.5 (L) 06/15/2017 0419   CALCIUM 9.4 06/15/2014 1507   GFRNONAA 15 (L) 06/15/2017 0419   GFRNONAA 49 (L) 06/15/2014 1507   GFRAA 18 (L) 06/15/2017 0419   GFRAA 56 (L) 06/15/2014 1507   CBC    Component Value Date/Time    WBC 8.8 06/16/2017 0732   RBC 3.85 (L) 06/16/2017 0732   HGB 10.5 (L) 06/16/2017 0732   HGB 13.7 08/12/2014 1357   HCT 33.7 (L) 06/16/2017 0732   HCT 24.8 (L) 01/05/2017 1350   PLT 295 06/16/2017 0732   PLT 267 08/12/2014 1357   MCV 87.5 06/16/2017 0732   MCV 98 08/12/2014 1357   MCH 27.3 06/16/2017 0732   MCHC 31.2 06/16/2017 0732   RDW 14.8 06/16/2017 0732   RDW 12.6 08/12/2014 1357   LYMPHSABS 2.0 05/15/2017 0820   LYMPHSABS 2.5 08/12/2014 1357   MONOABS 0.6 05/15/2017 0820   MONOABS 0.7 08/12/2014 1357   EOSABS 0.2 05/15/2017 0820   EOSABS 0.4 08/12/2014 1357   BASOSABS 0.0 05/15/2017 0820   BASOSABS 0.1 08/12/2014 1357     Assessment/Plan:  1. 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  Has been seen multiple times as an inpatient, however she has not followed up as an outpatient.  Diuresed well.  Will need to f/u with Dr. Hyman Hopes as an outpatient when stable.  2. Nephrotic syndrome- responding to IV lasix, may be able to change to po 3. HTN- recommend increasing coreg.  4. Abdominal pain/pyuria- on abx per primary svc.  5. Disposition- for discharge to SNF and will need f/u with [facility] due to her progressive CKD.  Irena Cords, MD [employer] [phone]

## 2017-06-16 NOTE — Progress Notes (Signed)
OT Cancellation Note  Patient Details Name: Cassandra [name] MRN: [id number] DOB: [birth year]   Cancelled Treatment:    Reason Eval/Treat Not Completed: Patient declined, no reason specified ( reports nausea and headache across forehead area only) Reports "I just dont feel like doing anything. I got up yesterday. I can eat in the bed."  Cassandra [name], Cassandra [name]  [phone] 06/16/2017, 7:57 AM

## 2017-06-16 NOTE — Progress Notes (Signed)
Notified MD of pts cbg of 417. MD ordered RN to give 9units of sliding scale.

## 2017-06-16 NOTE — Care Management Note (Addendum)
Case Management Note  Patient Details  Name: Merril AbbeBillie A Warbington MRN: [id number] Date of Birth: 1946/08/26  Subjective/Objective:  Pt presented for worsening generalized weakness and peripheral edema. Nephrotic Syndrome-Continues on IV Lasix.  Pt was from home with husband- previously active with [facility]. Pt is now agreeable to SNF. CM did make pt aware that CSW will speak with her in regards to Facility.                    Action/Plan: CM did speak with pt in regards to disposition needs. Pt is agreeable to SNF and CSW is aware. No further needs from CM at this time.   Expected Discharge Date:  06/14/17               Expected Discharge Plan:  Skilled Nursing Facility  In-House Referral:  Clinical Social Work  Discharge planning Services  CM Consult  Post Acute Care Choice:  Home Health, Resumption of Svcs/PTA Provider Choice offered to:  Patient  DME Arranged:  N/A DME Agency:  NA  HH Arranged:  NA HH [facility]  [facility]  Status of Service:  Completed, signed off  If discussed at [employer] Meetings, dates discussed:    Additional Comments:  Gala LewandowskyGraves-Bigelow, Michele Kerlin Kaye, RN 06/16/2017, 11:11 AM

## 2017-06-16 NOTE — Progress Notes (Signed)
PROGRESS NOTE    Cassandra [name]  XBJ:[id number] DOB: 1946/09/02 DOA: 06/12/2017 PCP: Henrine Screws, MD    Brief Narrative:  71 y.o.femalewith medical history significant of brittle DM2, insulin dependent, multiple admits for DKA, CKD stage 3 (now appears progressed to stage 4). Patient does not have a history of CHF, 2D echo last month demonstrated normal EF 65-70%. Rather her severe peripheral edema and Anasarca is due to albumins of 1.8 and nephrotic syndrome also the opinion of Dr. Hyman Hopes on consult note last month. Patient was recommended to go to SNF last month at discharge but elected to go home instead. She has had worsening generalized weakness at home, worsening peripheral edema and is now unable to be cared for by family and home health at home she says.  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  Cr increased today.   C diff colitis;  c diff PCR positive.  Still with multiples BM.  Started on oral vancomycin/  Will stop reglan.   Anasarca;  On IV lasix.  amlodipine discontinue.  Gabapentin was stopped.   UTI;  On ceftriaxone day 2 /3.  DM;  Had episode of hypoglycemia, Lantus was discontinue.   Hypothyroidism;  Continue with synthroid.   Hypertension Cont carvedilol hydralazine.      DVT prophylaxis: heparin  Code Status: full code.  Family Communication: none at bedside.  Disposition Plan: remain inpatient , IV  lasix.   Consultants:   Nephrology    Procedures:   none   Antimicrobials:   Ceftriaxone 6-30  Oral vancomycin 7-01   Subjective: She doesn't know how many bowel movement she has had. Per nurse report patient with 7 BM in last 24 hours.  Complaining of headaches.  Denies abdominal pain or dyspnea.  Agree with going to rehab.   Objective: Vitals:   06/15/17 1645 06/15/17 2041 06/16/17 0540 06/16/17 0700  BP: 136/68 (!) 154/56 (!) 186/76   Pulse: 72 80 99   Resp: 18 17 18    Temp: 98.8 F (37.1 C) 98.4 F (36.9 C) 98.6 F (37 C)   TempSrc: Oral Oral Oral   SpO2: 100% 94% 94%   Weight:  60.2 kg (132 lb 11.2 oz)    Height:    5\' 4"  (1.626 m)    Intake/Output Summary (Last 24 hours) at 06/16/17 0849 Last data filed at 06/16/17 0540  Gross per 24 hour  Intake              592 ml  Output              825 ml  Net             -233 ml   Filed Weights   06/13/17 2100 06/14/17 2300 06/15/17 2041  Weight: 60.9 kg (134 lb 4.2 oz) 61.3 kg (135 lb 1.6 oz) 60.2 kg (132 lb 11.2 oz)    Examination:  General exam: Appears calm and comfortable  Respiratory  system: Clear to auscultation. Respiratory effort normal. Cardiovascular system: S1 & S2 heard, RRR. No JVD, murmurs, rubs, gallops or clicks. No pedal edema. Gastrointestinal system: Abdomen is nondistended, soft and nontender. No organomegaly or masses felt. Normal bowel sounds heard. Central nervous system: Alert and oriented. No focal neurological deficits. Extremities: Symmetric 5 x 5 power. Skin: No rashes, lesions or ulcers     Data Reviewed: I have personally reviewed following labs and imaging studies  CBC:  Recent Labs Lab 06/12/17 1553 06/14/17 0847 06/16/17 0732  WBC 9.7 7.8 8.8  HGB 9.8* 10.8* 10.5*  HCT 30.6* 33.4* 33.7*  MCV 84.8 85.9 87.5  PLT 299 308 295   Basic Metabolic Panel:  Recent Labs Lab 06/12/17 1553 06/14/17 0847 06/15/17 0419  NA 134* 136 136  K 3.7 3.5 3.1*  CL 104 104  104  CO2 21* 24 24  GLUCOSE 362* 253* 162*  BUN 56* 62* 64*  CREATININE 2.74* 2.86* 2.96*  CALCIUM 7.7* 7.9* 7.5*   GFR: Estimated Creatinine Clearance: 15.5 mL/min (A) (by C-G formula based on SCr of 2.96 mg/dL (H)). Liver Function Tests:  Recent Labs Lab 06/12/17 2050 06/14/17 0847 06/15/17 0419  AST 13* 16 12*  ALT 11* 11* 11*  ALKPHOS 72 78 66  BILITOT 0.7 0.4 0.3  PROT 4.5* 4.6* 4.2*  ALBUMIN 1.4* 1.5* 1.3*   No results for input(s): LIPASE, AMYLASE in the last 168 hours. No results for input(s): AMMONIA in the last 168 hours. Coagulation Profile: No results for input(s): INR, PROTIME in the last 168 hours. Cardiac Enzymes: No results for input(s): CKTOTAL, CKMB, CKMBINDEX, TROPONINI in the last 168 hours. BNP (last 3 results) No results for input(s): PROBNP in the last 8760 hours. HbA1C: No results for input(s): HGBA1C in the last 72 hours. CBG:  Recent Labs Lab 06/15/17 0757 06/15/17 1221 06/15/17 1714 06/15/17 2041 06/16/17 0726  GLUCAP 237* 271* 174* 165* 417*   Lipid Profile: No results for input(s): CHOL, HDL, LDLCALC, TRIG, CHOLHDL, LDLDIRECT in the last 72 hours. Thyroid Function Tests: No results for input(s): TSH, T4TOTAL, FREET4, T3FREE, THYROIDAB in the last 72 hours. Anemia Panel: No results for input(s): VITAMINB12, FOLATE, FERRITIN, TIBC, IRON, RETICCTPCT in the last 72 hours. Sepsis Labs: No results for input(s): PROCALCITON, LATICACIDVEN in the last 168 hours.  Recent Results (from the past 240 hour(s))  C difficile quick scan w PCR reflex     Status: Abnormal   Collection Time: 06/15/17  7:02 AM  Result Value Ref Range Status   C Diff antigen POSITIVE (A) NEGATIVE Final   C Diff toxin NEGATIVE NEGATIVE Final   C Diff interpretation Results are indeterminate. See PCR results.  Final  Clostridium Difficile by PCR     Status: Abnormal   Collection Time: 06/15/17  7:02 AM  Result Value Ref Range Status   Toxigenic C Difficile by pcr  POSITIVE (A) NEGATIVE Final    Comment: Positive for toxigenic C. difficile with little to no toxin production. Only treat if clinical presentation suggests symptomatic illness.         Radiology Studies: Dg Abd 2 Views  Result Date: 06/14/2017 CLINICAL DATA:  Suprapubic abdominal pain EXAM: ABDOMEN - 2 VIEW COMPARISON:  None. FINDINGS: Prior cholecystectomy. Gas throughout nondistended large and small bowel. No convincing evidence for obstruction. No free air organomegaly. IMPRESSION: Nonspecific bowel gas pattern without convincing evidence of obstruction. Electronically Signed   By: Charlett Nose M.D.   On: 06/14/2017 16:27  Scheduled Meds: . atorvastatin  20 mg Oral q1800  . carvedilol  3.125 mg Oral BID WC  . escitalopram  20 mg Oral Daily  . feeding supplement (NEPRO CARB STEADY)  237 mL Oral QHS  . feeding supplement (PRO-STAT SUGAR FREE 64)  30 mL Oral TID  . heparin  5,000 Units Subcutaneous Q8H  . hydrALAZINE  100 mg Oral Q8H  . insulin aspart  0-5 Units Subcutaneous QHS  . insulin aspart  0-9 Units Subcutaneous TID WC  . iron polysaccharides  150 mg Oral BID  . levothyroxine  200 mcg Oral QAC breakfast  . linagliptin  5 mg Oral Daily  . metoCLOPramide  5 mg Oral TID AC  . potassium chloride  20 mEq Oral BID  . vancomycin  125 mg Oral QID   Continuous Infusions: . cefTRIAXone (ROCEPHIN)  IV Stopped (06/15/17 1702)  . furosemide Stopped (06/16/17 0803)     LOS: 2 days    Time spent: 35 minutes.     Alba Coryegalado, Tarin Johndrow A, MD [facility] Pager [phone]  If 7PM-7AM, please contact night-coverage [email or web address] Password [email or web address] 06/16/2017, 8:49 AM

## 2017-06-16 NOTE — NC FL2 (Signed)
[place] MEDICAID FL2 LEVEL OF CARE SCREENING TOOL     IDENTIFICATION  Patient Name: Cassandra [name] Birthdate: [birth year] Sex: female Admission Date (Current Location): 06/12/2017  [facility] [place]   Facility and Address:  [facility], [address], [place], [place] [address]      Provider Number: [id number]  Attending Physician Name and Address:  Alba Coryegalado, Belkys A, MD  Relative Name and Phone Number:       Current Level of Care: Hospital Recommended Level of Care: Skilled Nursing Facility Prior Approval Number:    Date Approved/Denied:   PASRR Number:    Discharge Plan: SNF    Current Diagnoses: Patient Active Problem List   Diagnosis Date Noted  . Acute renal failure (ARF) (HCC) 06/14/2017  . Malnutrition of moderate degree 06/13/2017  . Acute renal failure (HCC) 06/13/2017  . CKD stage 4 due to type 2 diabetes mellitus (HCC) 06/12/2017  . Anasarca associated with disorder of kidney 06/12/2017  . Nephrotic syndrome due to type 2 diabetes mellitus (HCC) 06/12/2017  . Anasarca 05/09/2017  . Nausea vomiting and diarrhea 03/08/2017  . Constipation   . Generalized abdominal pain   . Fecal impaction (HCC) 01/04/2017  . HAP (hospital-acquired pneumonia) 12/23/2016  . HCAP (healthcare-associated pneumonia)   . Diabetes mellitus with complication (HCC)   . Hypoglycemia 12/03/2016  . Palliative care encounter   . Goals of care, counseling/discussion   . Cervical vertebral fracture (HCC) 10/17/2016  . Thoracic vertebral fracture (HCC) 10/17/2016  . Fall at nursing home 10/17/2016  . Common bile duct dilatation 10/17/2016  . Closed fracture of cervical vertebra (HCC)   . Recent Gram-negative bacteremia 10/07/2016  . Hyperkalemia   . Gastroesophageal reflux disease   . DKA, type 1 (HCC) 09/06/2016  . Protein-calorie malnutrition, severe 08/28/2016  . AKI (acute kidney injury) (HCC) 07/19/2016  . Acute on chronic  diastolic CHF (congestive heart failure) (HCC) 07/19/2016  . Dehydration   . Cerebral embolism with cerebral infarction 07/13/2016  . Left ventricular diastolic dysfunction, NYHA class 1 07/11/2016  . History of CVA (cerebrovascular accident)   . HLD (hyperlipidemia)   . Anemia of chronic disease   . Physical deconditioning   . Acute on chronic kidney failure (HCC)   . Diabetic ketoacidosis with coma associated with type 1 diabetes mellitus (HCC)   . Cerebral thrombosis with cerebral infarction 05/15/2016  . Generalized anxiety disorder 05/10/2016  . Diabetic ketoacidosis without coma associated with type 1 diabetes mellitus (HCC) 05/06/2016  . Insulin dependent type 2 diabetes mellitus, uncontrolled (HCC)   . Depression   . CKD (chronic kidney disease) stage 3, GFR 30-59 ml/min   . Uncontrolled Hypothyroidism   . Vitamin B12 deficiency   . Benign paroxysmal positional vertigo   . Anxiety   . Allergic rhinitis   . Glaucoma   . Benign hypertension with CKD (chronic kidney disease) stage III   . Tobacco abuse   . Cervicalgia   . Elevated liver enzymes   . History of alcohol use     Orientation RESPIRATION BLADDER Height & Weight     Self, Time, Situation, Place  Normal Incontinent Weight: 132 lb 11.2 oz (60.2 kg) Height:  5\' 4"  (162.6 cm)  BEHAVIORAL SYMPTOMS/MOOD NEUROLOGICAL BOWEL NUTRITION STATUS      Incontinent Diet (see DC summary)  AMBULATORY STATUS COMMUNICATION OF NEEDS Skin   Extensive Assist Verbally Normal  Personal Care Assistance Level of Assistance  Bathing Bathing Assistance: Maximum assistance   Dressing Assistance: Maximum assistance     Functional Limitations Info             SPECIAL CARE FACTORS FREQUENCY  PT (By licensed PT), OT (By licensed OT)     PT Frequency: 5/wk OT Frequency: 5/wk            Contractures      Additional Factors Info  Code Status, Allergies, Isolation Precautions Code Status Info:  FULL Allergies Info: Alprazolam, Percocet Oxycodone-acetaminophen, Codeine, Doxycycline, Hydrocodone, Omnicef Cefdinir, Tramadol, Augmentin Amoxicillin-pot Clavulanate, Ciprofloxacin     Isolation Precautions Info: Enteric     Current Medications (06/16/2017):  This is the current hospital active medication list Current Facility-Administered Medications  Medication Dose Route Frequency Provider Last Rate Last Dose  . acetaminophen (TYLENOL) tablet 650 mg  650 mg Oral Q6H PRN Hillary Bow, DO   650 mg at 06/16/17 1249   Or  . acetaminophen (TYLENOL) suppository 650 mg  650 mg Rectal Q6H PRN Hillary Bow, DO      . atorvastatin (LIPITOR) tablet 20 mg  20 mg Oral q1800 Lyda Perone M, DO   20 mg at 06/15/17 1730  . carvedilol (COREG) tablet 3.125 mg  3.125 mg Oral BID WC Lyda Perone M, DO   3.125 mg at 06/16/17 1610  . cefTRIAXone (ROCEPHIN) 1 g in dextrose 5 % 50 mL IVPB  1 g Intravenous Q24H Pearson Grippe, MD   Stopped at 06/15/17 1702  . escitalopram (LEXAPRO) tablet 20 mg  20 mg Oral Daily Lyda Perone M, DO   20 mg at 06/16/17 1053  . feeding supplement (NEPRO CARB STEADY) liquid 237 mL  237 mL Oral QHS Pearson Grippe, MD   237 mL at 06/14/17 2200  . feeding supplement (PRO-STAT SUGAR FREE 64) liquid 30 mL  30 mL Oral TID Pearson Grippe, MD   30 mL at 06/16/17 1053  . furosemide (LASIX) 120 mg in dextrose 5 % 50 mL IVPB  120 mg Intravenous Q8H Delano Metz, MD   Stopped at 06/16/17 1528  . heparin injection 5,000 Units  5,000 Units Subcutaneous Q8H Lyda Perone M, DO   5,000 Units at 06/16/17 1419  . hydrALAZINE (APRESOLINE) injection 10 mg  10 mg Intravenous Q6H PRN Pearson Grippe, MD      . hydrALAZINE (APRESOLINE) tablet 100 mg  100 mg Oral Q8H Pearson Grippe, MD   100 mg at 06/16/17 1419  . hydrocortisone (ANUSOL-HC) 2.5 % rectal cream   Rectal TID PRN Leda Gauze, NP      . insulin aspart (novoLOG) injection 0-5 Units  0-5 Units Subcutaneous TID WC Regalado, Belkys A, MD       . insulin aspart (novoLOG) injection 0-5 Units  0-5 Units Subcutaneous QHS Regalado, Belkys A, MD      . insulin aspart (novoLOG) injection 2 Units  2 Units Subcutaneous TID WC Regalado, Belkys A, MD      . insulin glargine (LANTUS) injection 7 Units  7 Units Subcutaneous Daily Regalado, Belkys A, MD   7 Units at 06/16/17 1100  . iron polysaccharides (NIFEREX) capsule 150 mg  150 mg Oral BID Lyda Perone M, DO   150 mg at 06/16/17 1053  . levothyroxine (SYNTHROID, LEVOTHROID) tablet 200 mcg  200 mcg Oral QAC breakfast Pearson Grippe, MD   200 mcg at 06/16/17 [phone]  . ondansetron (ZOFRAN) tablet 4 mg  4 mg Oral  Q6H PRN Hillary Bow, DO       Or  . ondansetron [facility]) injection 4 mg  4 mg Intravenous Q6H PRN Hillary Bow, DO      . vancomycin (VANCOCIN) 50 mg/mL oral solution 125 mg  125 mg Oral QID Pearson Grippe, MD   125 mg at 06/16/17 1419     Discharge Medications: Please see discharge summary for a list of discharge medications.  Relevant Imaging Results:  Relevant Lab Results:   Additional Information ss: [id number]  Burna Sis, LCSW

## 2017-06-17 LAB — RENAL FUNCTION PANEL
Albumin: 1.5 g/dL — ABNORMAL LOW (ref 3.5–5.0)
Anion gap: 9 (ref 5–15)
BUN: 66 mg/dL — ABNORMAL HIGH (ref 6–20)
CHLORIDE: 104 mmol/L (ref 101–111)
CO2: 24 mmol/L (ref 22–32)
Calcium: 7.8 mg/dL — ABNORMAL LOW (ref 8.9–10.3)
Creatinine, Ser: 2.97 mg/dL — ABNORMAL HIGH (ref 0.44–1.00)
GFR, EST AFRICAN AMERICAN: 17 mL/min — AB (ref >60)
GFR, EST NON AFRICAN AMERICAN: 15 mL/min — AB (ref >60)
Glucose, Bld: 125 mg/dL — ABNORMAL HIGH (ref 65–99)
POTASSIUM: 3.5 mmol/L (ref 3.5–5.1)
Phosphorus: 4.7 mg/dL — ABNORMAL HIGH (ref 2.5–4.6)
Sodium: 137 mmol/L (ref 135–145)

## 2017-06-17 LAB — GLUCOSE, CAPILLARY
GLUCOSE-CAPILLARY: 122 mg/dL — AB (ref 65–99)
GLUCOSE-CAPILLARY: 186 mg/dL — AB (ref 65–99)
GLUCOSE-CAPILLARY: 143 mg/dL — AB (ref 65–99)
Glucose-Capillary: 166 mg/dL — ABNORMAL HIGH (ref 65–99)
Glucose-Capillary: 142 mg/dL — ABNORMAL HIGH (ref 65–99)

## 2017-06-17 MED ORDER — AMLODIPINE BESYLATE 5 MG PO TABS
5.0000 mg | ORAL_TABLET | Freq: Every day | ORAL | Status: DC
  Administered 2017-06-17 – 2017-06-20 (×3): 5 mg via ORAL
  Filled 2017-06-17 (×4): qty 1

## 2017-06-17 NOTE — Progress Notes (Signed)
Physical Therapy Treatment Patient Details Name: Cassandra [name] MRN: [id number] DOB: [birth year] Today's Date: 06/17/2017    History of Present Illness 71 y.o.femalewith medical history significant of brittle DM2, insulin dependent, multiple admits for DKA, CKD stage 3 (now appears progressed to stage 4) presenting with acute on chronic anasarca and fall at home.    PT Comments    Continuing work on functional mobility and activity tolerance;  Able to walk a bit today; very dependent on RW for support; very focused on getting back to bed; continue to recommend SNF for post acute rehab to maximize independence and safety with mobility    Follow Up Recommendations  SNF;Supervision/Assistance - 24 hour     Equipment Recommendations  None recommended by PT    Recommendations for Other Services       Precautions / Restrictions Precautions Precautions: Fall Restrictions Weight Bearing Restrictions: No    Mobility  Bed Mobility Overal bed mobility: Needs Assistance Bed Mobility: Sit to Sidelying         Sit to sidelying: Supervision General bed mobility comments: Slow moving, but no difficulty  Transfers Overall transfer level: Needs assistance Equipment used: Rolling walker (2 wheeled) Transfers: Sit to/from Stand Sit to Stand: Min assist         General transfer comment: assist to rise and stabilize.    Ambulation/Gait Ambulation/Gait assistance: Min guard Ambulation Distance (Feet): 12 Feet Assistive device: Rolling walker (2 wheeled) Gait Pattern/deviations: Decreased step length - right;Decreased step length - left;Trunk flexed     General Gait Details: Slow moving and dependent on RW for bil support   Stairs            Wheelchair Mobility    Modified Rankin (Stroke Patients Only)       Balance     Sitting balance-Leahy Scale: Fair       Standing balance-Leahy Scale: Poor                              Cognition  Arousal/Alertness: Awake/alert Behavior During Therapy: Flat affect Overall Cognitive Status: No family/caregiver present to determine baseline cognitive functioning (wfls for session)                                        Exercises      General Comments        Pertinent Vitals/Pain Pain Assessment: Faces Faces Pain Scale: Hurts little more Pain Location: buttocks, head Pain Descriptors / Indicators: Burning;Headache Pain Intervention(s): Other (comment);Repositioned;Premedicated before session (Rn aware)    Home Living                      Prior Function            PT Goals (current goals can now be found in the care plan section) Acute Rehab PT Goals Patient Stated Goal: at time of session pt very much wanted back to bed PT Goal Formulation: Patient unable to participate in goal setting Time For Goal Achievement: 06/20/17 Potential to Achieve Goals: Fair Progress towards PT goals: Progressing toward goals    Frequency    Min 2X/week      PT Plan Current plan remains appropriate    Co-evaluation              AM-PAC PT "6 Clicks" Daily Activity  Outcome Measure  Difficulty turning over in bed (including adjusting bedclothes, sheets and blankets)?: A Little Difficulty moving from lying on back to sitting on the side of the bed? : A Little Difficulty sitting down on and standing up from a chair with arms (e.g., wheelchair, bedside commode, etc,.)?: Total Help needed moving to and from a bed to chair (including a wheelchair)?: A Little Help needed walking in hospital room?: A Little Help needed climbing 3-5 steps with a railing? : A Lot 6 Click Score: 15    End of Session Equipment Utilized During Treatment: Gait belt Activity Tolerance: Patient tolerated treatment well;Other (comment) (very focused on getting back to bed) Patient left: in bed;with call bell/phone within reach;with nursing/sitter in room Nurse Communication:  Mobility status PT Visit Diagnosis: Unsteadiness on feet (R26.81);Muscle weakness (generalized) (M62.81)     Time: [id number] PT Time Calculation (min) (ACUTE ONLY): 17 min  Charges:  $Gait Training: 8-22 mins                    G Codes:       Van ClinesHolly Gracious Renken, PT  Acute Rehabilitation Services Pager [phone] Office [phone]    Levi AlandHolly H Osbaldo Mark 06/17/2017, 1:36 PM

## 2017-06-17 NOTE — Progress Notes (Signed)
PROGRESS NOTE    Cassandra [name]  ZOX:[id number] DOB: 12-23-1945 DOA: 06/12/2017 PCP: Henrine Screwshacker, Robert, MD    Brief Narrative:  71 y.o.femalewith medical history significant of brittle DM2, insulin dependent, multiple admits for DKA, CKD stage 3 (now appears progressed to stage 4). Patient does not have a history of CHF, 2D echo last month demonstrated normal EF 65-70%. Rather her severe peripheral edema and Anasarca is due to albumins of 1.8 and nephrotic syndrome also the opinion of Dr. Hyman HopesWebb on consult note last month. Patient was recommended to go to SNF last month at discharge but elected to go home instead. She has had worsening generalized weakness at home, worsening peripheral edema and is now unable to be cared for by family and home health at home she says.  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 nephrology managing lasix.  Stable.   C diff colitis;  c diff PCR positive.  Still with multiples BM.  Started on oral vancomycin/  stop reglan.  Report less diarrhea today.   Anasarca;  On IV lasix.  Gabapentin was stopped.   UTI;  On ceftriaxone day 3/3. Last dose today   Malnutrition, hypoalbuminemia;  Nutrition consulted.   DM;  Had episode of hypoglycemia, Lantus was discontinue.  Low dose lantus resume.   Hypothyroidism;  Continue with synthroid.   Hypertension Cont carvedilol hydralazine.  Will resume Norvasc, BP  uncontrolled.     DVT prophylaxis: heparin  Code Status: full code.  Family Communication: none at bedside.  Disposition Plan: remain inpatient , IV lasix.   Consultants:   Nephrology    Procedures:   none   Antimicrobials:   Ceftriaxone 6-30  Oral vancomycin 7-01   Subjective: She report weakness today and some dizziness.  Diarrhea improved.    Objective: Vitals:   06/16/17 2151 06/17/17 0421 06/17/17 0730 06/17/17 1330  BP: (!) 176/64 (!) 190/74 (!) 159/67 (!) 185/73  Pulse: 82 80 81   Resp: 18 18 18    Temp: 98.5 F (36.9 C) 98.7 F (37.1 C) 98.2 F (36.8 C)   TempSrc: Oral Oral Oral   SpO2: 99% 96% 98%   Weight:      Height:        Intake/Output Summary (Last 24 hours) at 06/17/17 1406 Last data filed at 06/17/17 1045  Gross per 24 hour  Intake             1136 ml  Output             1509 ml  Net             -373 ml   Filed Weights   06/13/17 2100 06/14/17 2300 06/15/17 2041  Weight: 60.9 kg (134 lb 4.2 oz) 61.3 kg (135 lb 1.6 oz) 60.2 kg (132 lb 11.2 oz)    Examination:  General exam: NAD Respiratory system: respiratory effort normal.  Cardiovascular system; S 1, S 2 RRR, trace edema LE.  Gastrointestinal system: Abdomen soft, , distended, nt Central nervous system: No focal deficit.  Extremities: Symmetric 5 x 5 power. Skin: No rashes, lesions or ulcers     Data Reviewed: I have personally reviewed following labs and imaging studies  CBC:  Recent Labs Lab 06/12/17 1553 06/14/17 0847 06/16/17 0732  WBC 9.7 7.8 8.8  HGB 9.8* 10.8* 10.5*  HCT 30.6* 33.4* 33.7*  MCV 84.8 85.9 87.5  PLT 299 308 295   Basic Metabolic Panel:  Recent Labs Lab 06/12/17 1553 06/14/17 0847 06/15/17 0419 06/16/17 0732 06/17/17 0627  NA 134* 136 136 137 137  K 3.7 3.5 3.1* 4.2 3.5  CL 104 104 104 106 104  CO2 21* 24 24 16* 24  GLUCOSE 362* 253* 162* 420* 125*  BUN 56* 62* 64* 65* 66*  CREATININE 2.74* 2.86* 2.96* 3.06* 2.97*  CALCIUM 7.7*  7.9* 7.5* 7.7* 7.8*  PHOS  --   --   --   --  4.7*   GFR: Estimated Creatinine Clearance: 15.4 mL/min (A) (by C-G formula based on SCr of 2.97 mg/dL (H)). Liver Function Tests:  Recent Labs Lab 06/12/17 2050 06/14/17 0847 06/15/17 0419 06/16/17 0732 06/17/17 0627  AST 13* 16 12* 11*  --   ALT 11* 11* 11* 11*  --   ALKPHOS 72 78 66 73  --   BILITOT 0.7 0.4 0.3 0.8  --   PROT 4.5* 4.6* 4.2* 4.7*  --   ALBUMIN 1.4* 1.5* 1.3* 1.5* 1.5*   No results for input(s): LIPASE, AMYLASE in the last 168 hours. No results for input(s): AMMONIA in the last 168 hours. Coagulation Profile: No results for input(s): INR, PROTIME in the last 168 hours. Cardiac Enzymes: No results for input(s): CKTOTAL, CKMB, CKMBINDEX, TROPONINI in the last 168 hours. BNP (last 3 results) No results for input(s): PROBNP in the last 8760 hours. HbA1C: No results for input(s): HGBA1C in the last 72 hours. CBG:  Recent Labs Lab 06/16/17 1646 06/16/17 2045 06/17/17 0733 06/17/17 1247 06/17/17 1314  GLUCAP 319* 203* 122* 166* 186*   Lipid Profile: No results for input(s): CHOL, HDL, LDLCALC, TRIG, CHOLHDL, LDLDIRECT in the last 72 hours. Thyroid Function Tests: No results for input(s): TSH, T4TOTAL, FREET4, T3FREE, THYROIDAB in the last 72 hours. Anemia Panel: No results for input(s): VITAMINB12, FOLATE, FERRITIN, TIBC, IRON, RETICCTPCT in the last 72 hours. Sepsis Labs: No results for input(s): PROCALCITON, LATICACIDVEN in the last 168 hours.  Recent Results (from the past 240 hour(s))  C difficile quick scan w PCR reflex     Status: Abnormal   Collection Time: 06/15/17  7:02 AM  Result Value Ref Range Status   C Diff antigen POSITIVE (A) NEGATIVE Final   C Diff toxin NEGATIVE NEGATIVE Final   C Diff interpretation Results are indeterminate. See PCR results.  Final  Clostridium Difficile by PCR     Status: Abnormal   Collection Time: 06/15/17  7:02 AM  Result Value Ref Range Status   Toxigenic C  Difficile by pcr POSITIVE (A) NEGATIVE Final    Comment: Positive for toxigenic C. difficile with little to no toxin production. Only treat if clinical presentation suggests symptomatic illness.         Radiology Studies: No results found.      Scheduled Meds: . atorvastatin  20 mg Oral q1800  . carvedilol  3.125 mg Oral BID WC  . escitalopram  20 mg Oral Daily  . feeding supplement (NEPRO CARB STEADY)  237  mL Oral QHS  . feeding supplement (PRO-STAT SUGAR FREE 64)  30 mL Oral TID  . heparin  5,000 Units Subcutaneous Q8H  . hydrALAZINE  100 mg Oral Q8H  . insulin aspart  0-5 Units Subcutaneous TID WC  . insulin aspart  0-5 Units Subcutaneous QHS  . insulin aspart  2 Units Subcutaneous TID WC  . insulin glargine  7 Units Subcutaneous Daily  . iron polysaccharides  150 mg Oral BID  . levothyroxine  200 mcg Oral QAC breakfast  . vancomycin  125 mg Oral QID   Continuous Infusions: . furosemide 120 mg (06/17/17 1326)     LOS: 3 days    Time spent: 35 minutes.     Alba Cory, MD [facility] Pager [phone]  If 7PM-7AM, please contact night-coverage [email or web address] Password [email or web address] 06/17/2017, 2:06 PM

## 2017-06-17 NOTE — Progress Notes (Signed)
Nutrition Follow-up  DOCUMENTATION CODES:   Non-severe (moderate) malnutrition in context of chronic illness  INTERVENTION:  1. Continue Nepro Shake po Q24H, each supplement provides 425 kcal and 19 grams protein 2. Continue Pro-Stat 30mL TID, each supplement provides 15gm protein and 100 calories  NUTRITION DIAGNOSIS:   Malnutrition (Mild) related to chronic illness (CKD) as evidenced by mild depletion of body fat, moderate depletion of body fat, moderate to severe fluid accumulation, mild depletion of muscle mass, moderate depletions of muscle mass-ongoing  GOAL:   Patient will meet greater than or equal to 90% of their needs -progressing  MONITOR:   PO intake, Supplement acceptance, Labs, Weight trends, Skin, I & O's  REASON FOR ASSESSMENT:   Malnutrition Screening Tool    ASSESSMENT:   Cassandra [name] is a 71 y.o. female with medical history significant of brittle DM2, insulin dependent, multiple admits for DKA, CKD stage 3 (now appears progressed to stage 4).  Spoke with patient briefly. She states she ate well today, could not remember what she ate, but states she ate all of her food. Documented 100% PO for breakfast and 75% for lunch. Had chicken and dumplings with asparagus, peaches, and a roll for lunch. Scrambled eggs, toast, and sausage for breakfast. Appetite is good. Also reports drinking one NS last night. No acute complaints. Labs and medications reviewed: BUN/Creatinine 66/2.97, Phos 4.7  Diet Order:  Diet Carb Modified Fluid consistency: Thin; Room service appropriate? Yes  Skin:  Reviewed, no issues  Last BM:  06/13/17  Height:   Ht Readings from Last 1 Encounters:  06/16/17 5\' 4"  (1.626 m)    Weight:   Wt Readings from Last 1 Encounters:  06/15/17 132 lb 11.2 oz (60.2 kg)    Ideal Body Weight:  50 kg  BMI:  Body mass index is 22.78 kg/m.  Estimated Nutritional Needs:   Kcal:  1650-1850  Protein:  80-95 grams  Fluid:  per  MD  EDUCATION NEEDS:   Education needs no appropriate at this time  Dionne AnoWilliam M. Gevorg Brum, MS, RD LDN Inpatient Clinical Dietitian Pager [phone]

## 2017-06-17 NOTE — Progress Notes (Signed)
Occupational Therapy Treatment Patient Details Name: Cassandra [name] MRN: [id number] DOB: 1946-10-04 Today's Date: 06/17/2017    History of present illness 71 y.o.femalewith medical history significant of brittle DM2, insulin dependent, multiple admits for DKA, CKD stage 3 (now appears progressed to stage 4) presenting with acute on chronic anasarca and fall at home.   OT comments  Assisted pt with cleaning up and getting into recliner for breakfast.  Pt had external catheter; wet and incontinent x bowel  Follow Up Recommendations  SNF    Equipment Recommendations  3 in 1 bedside commode    Recommendations for Other Services      Precautions / Restrictions Precautions Precautions: Fall Restrictions Weight Bearing Restrictions: No       Mobility Bed Mobility     Rolling: Min assist (to turn completely) Sidelying to sit: Max assist       General bed mobility comments: assist for legs and trunk (light max A)  Transfers   Equipment used: 1 person hand held assist     Stand pivot transfers: Mod assist       General transfer comment: assist to rise and stabilize.  Assist to weight shift to turn hips to  recliner    Balance             Standing balance-Leahy Scale: Poor                             ADL either performed or assessed with clinical judgement   ADL Overall ADL's : Needs assistance/impaired     Grooming: Wash/dry hands;Sitting;Set up                   Toilet Transfer: Moderate assistance;Stand-pivot (to chair)   Toileting- Clothing Manipulation and Hygiene: Total assistance;Bed level         General ADL Comments: pt had female catheter in place but she was wet and had bowel movement; assisted her with hygiene then transferred to chair for breakfast.  Did not use RW--knees weak.  mod A SPT   Talked to NT about weakness with SPT/knees.  Called for RN to look at IV as it was leaking     Vision       Perception      Praxis      Cognition Arousal/Alertness: Awake/alert Behavior During Therapy: Flat affect Overall Cognitive Status: No family/caregiver present to determine baseline cognitive functioning (wfls for session)                                          Exercises     Shoulder Instructions       General Comments      Pertinent Vitals/ Pain       Pain Assessment: Faces Faces Pain Scale: Hurts even more Pain Location: buttocks Pain Descriptors / Indicators: Burning Pain Intervention(s): Limited activity within patient's tolerance;Monitored during session;Repositioned (added barrier cream)  Home Living                                          Prior Functioning/Environment              Frequency  Min 2X/week        Progress Toward Goals  OT  Goals(current goals can now be found in the care plan section)  Progress towards OT goals: Progressing toward goals (sl.owly)  Acute Rehab OT Goals Patient Stated Goal: none stated Time For Goal Achievement: 06/27/17  Plan      Co-evaluation                 AM-PAC PT "6 Clicks" Daily Activity     Outcome Measure   Help from another person eating meals?: None Help from another person taking care of personal grooming?: A Little Help from another person toileting, which includes using toliet, bedpan, or urinal?: A Lot Help from another person bathing (including washing, rinsing, drying)?: A Lot Help from another person to put on and taking off regular upper body clothing?: A Lot Help from another person to put on and taking off regular lower body clothing?: Total 6 Click Score: 14    End of Session    OT Visit Diagnosis: Unsteadiness on feet (R26.81);Other abnormalities of gait and mobility (R26.89);History of falling (Z91.81);Muscle weakness (generalized) (M62.81)   Activity Tolerance Patient tolerated treatment well   Patient Left in chair;with call bell/phone within reach;with  chair alarm set (and phone: call bell broken:  US to call)   Nurse Communication  (see adl section)        Time: [id number] OT Time Calculation (min): 25 min  Charges: OT General Charges $OT Visit: 1 Procedure OT Treatments $Self Care/Home Management : 23-37 mins  Cassandra [name], OTR/L [phone] 06/17/2017   Cassandra [name] 06/17/2017, 8:35 AM

## 2017-06-17 NOTE — Progress Notes (Signed)
Patient ID: Cassandra [name], female   DOB: 16-Feb-1946, 71 y.o.   MRN: [id number] S: feels better today O:BP (!) 159/67 (BP Location: Right Arm)   Pulse 81   Temp 98.2 F (36.8 C) (Oral)   Resp 18   Ht 5\' 4"  (1.626 m)   Wt 60.2 kg (132 lb 11.2 oz)   SpO2 98%   BMI 22.78 kg/m   Intake/Output Summary (Last 24 hours) at 06/17/17 0931 Last data filed at 06/17/17 0630  Gross per 24 hour  Intake              776 ml  Output             1708 ml  Net             -932 ml   Intake/Output: I/O last 3 completed shifts: In: 1248 [P.O.:900; IV Piggyback:348] Out: 2133 [Urine:2127; Stool:6]  Intake/Output this shift:  No intake/output data recorded. Weight change:  Gen:NAD CVS: no rub Resp:cta WUJ:[id number] Ext:1+ pretibial edema   Recent Labs Lab 06/12/17 1553 06/12/17 2050 06/14/17 0847 06/15/17 0419 06/16/17 0732 06/17/17 0627  NA 134*  --  136 136 137 137  K 3.7  --  3.5 3.1* 4.2 3.5  CL 104  --  104 104 106 104  CO2 21*  --  24 24 16* 24  GLUCOSE 362*  --  253* 162* 420* 125*  BUN 56*  --  62* 64* 65* 66*  CREATININE 2.74*  --  2.86* 2.96* 3.06* 2.97*  ALBUMIN  --  1.4* 1.5* 1.3* 1.5* 1.5*  CALCIUM 7.7*  --  7.9* 7.5* 7.7* 7.8*  PHOS  --   --   --   --   --  4.7*  AST  --  13* 16 12* 11*  --   ALT  --  11* 11* 11* 11*  --    Liver Function Tests:  Recent Labs Lab 06/14/17 0847 06/15/17 0419 06/16/17 0732 06/17/17 0627  AST 16 12* 11*  --   ALT 11* 11* 11*  --   ALKPHOS 78 66 73  --   BILITOT 0.4 0.3 0.8  --   PROT 4.6* 4.2* 4.7*  --   ALBUMIN 1.5* 1.3* 1.5* 1.5*   No results for input(s): LIPASE, AMYLASE in the last 168 hours. No results for input(s): AMMONIA in the last 168 hours. CBC:  Recent Labs Lab 06/12/17 1553 06/14/17 0847 06/16/17 0732  WBC 9.7 7.8 8.8  HGB 9.8* 10.8* 10.5*  HCT 30.6* 33.4* 33.7*  MCV 84.8 85.9 87.5  PLT 299 308 295   Cardiac Enzymes: No results for input(s): CKTOTAL, CKMB, CKMBINDEX, TROPONINI in the last 168  hours. CBG:  Recent Labs Lab 06/16/17 0726 06/16/17 1221 06/16/17 1646 06/16/17 2045 06/17/17 0733  GLUCAP 417* 315* 319* 203* 122*    Iron Studies: No results for input(s): IRON, TIBC, TRANSFERRIN, FERRITIN in the last 72 hours. Studies/Results: No results found. Marland Kitchen atorvastatin  20 mg Oral q1800  . carvedilol  3.125 mg Oral BID WC  . escitalopram  20 mg Oral Daily  . feeding supplement (NEPRO CARB STEADY)  237 mL Oral QHS  . feeding supplement (PRO-STAT SUGAR FREE 64)  30 mL Oral TID  . heparin  5,000 Units Subcutaneous Q8H  . hydrALAZINE  100 mg Oral Q8H  . insulin aspart  0-5 Units Subcutaneous TID WC  . insulin aspart  0-5 Units Subcutaneous QHS  . insulin aspart  2 Units  Subcutaneous TID WC  . insulin glargine  7 Units Subcutaneous Daily  . iron polysaccharides  150 mg Oral BID  . levothyroxine  200 mcg Oral QAC breakfast  . vancomycin  125 mg Oral QID    BMET    Component Value Date/Time   NA 137 06/17/2017 0627   NA 141 11/04/2016   NA 136 06/15/2014 1507   K 3.5 06/17/2017 0627   K 3.3 (L) 06/15/2014 1507   CL 104 06/17/2017 0627   CL 99 06/15/2014 1507   CO2 24 06/17/2017 0627   CO2 25 06/15/2014 1507   GLUCOSE 125 (H) 06/17/2017 0627   GLUCOSE 134 (H) 06/15/2014 1507   BUN 66 (H) 06/17/2017 0627   BUN 26 (A) 11/04/2016   BUN 16 06/15/2014 1507   CREATININE 2.97 (H) 06/17/2017 0627   CREATININE 1.17 06/15/2014 1507   CALCIUM 7.8 (L) 06/17/2017 0627   CALCIUM 9.4 06/15/2014 1507   GFRNONAA 15 (L) 06/17/2017 0627   GFRNONAA 49 (L) 06/15/2014 1507   GFRAA 17 (L) 06/17/2017 0627   GFRAA 56 (L) 06/15/2014 1507   CBC    Component Value Date/Time   WBC 8.8 06/16/2017 0732   RBC 3.85 (L) 06/16/2017 0732   HGB 10.5 (L) 06/16/2017 0732   HGB 13.7 08/12/2014 1357   HCT 33.7 (L) 06/16/2017 0732   HCT 24.8 (L) 01/05/2017 1350   PLT 295 06/16/2017 0732   PLT 267 08/12/2014 1357   MCV 87.5 06/16/2017 0732   MCV 98 08/12/2014 1357   MCH 27.3 06/16/2017  0732   MCHC 31.2 06/16/2017 0732   RDW 14.8 06/16/2017 0732   RDW 12.6 08/12/2014 1357   LYMPHSABS 2.0 05/15/2017 0820   LYMPHSABS 2.5 08/12/2014 1357   MONOABS 0.6 05/15/2017 0820   MONOABS 0.7 08/12/2014 1357   EOSABS 0.2 05/15/2017 0820   EOSABS 0.4 08/12/2014 1357   BASOSABS 0.0 05/15/2017 0820   BASOSABS 0.1 08/12/2014 1357     Assessment/Plan:  1. 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  Has been seen multiple times as an inpatient, however she has not followed up as an outpatient.  Diuresed well.  Will need to f/u with Dr. Hyman HopesWebb as an outpatient when stable.  2. Nephrotic syndrome- responding to IV lasix, may be able to change to po 3. HTN- recommend increasing coreg.  4. Abdominal pain/pyuria- on abx per primary svc.  5. Disposition- for discharge to SNF and will need f/u with [facility] due to her progressive CKD.  Irena CordsJoseph A. Gary Bultman, MD [facility] [phone]

## 2017-06-18 DIAGNOSIS — N179 Acute kidney failure, unspecified: Principal | ICD-10-CM

## 2017-06-18 LAB — RENAL FUNCTION PANEL
Albumin: 1.4 g/dL — ABNORMAL LOW (ref 3.5–5.0)
Anion gap: 8 (ref 5–15)
BUN: 63 mg/dL — ABNORMAL HIGH (ref 6–20)
CO2: 25 mmol/L (ref 22–32)
Calcium: 7.5 mg/dL — ABNORMAL LOW (ref 8.9–10.3)
Chloride: 103 mmol/L (ref 101–111)
Creatinine, Ser: 2.97 mg/dL — ABNORMAL HIGH (ref 0.44–1.00)
GFR calc Af Amer: 17 mL/min — ABNORMAL LOW
GFR calc non Af Amer: 15 mL/min — ABNORMAL LOW
Glucose, Bld: 212 mg/dL — ABNORMAL HIGH (ref 65–99)
Phosphorus: 5 mg/dL — ABNORMAL HIGH (ref 2.5–4.6)
Potassium: 3.3 mmol/L — ABNORMAL LOW (ref 3.5–5.1)
Sodium: 136 mmol/L (ref 135–145)

## 2017-06-18 LAB — GLUCOSE, CAPILLARY
GLUCOSE-CAPILLARY: 88 mg/dL (ref 65–99)
Glucose-Capillary: 212 mg/dL — ABNORMAL HIGH (ref 65–99)
Glucose-Capillary: 167 mg/dL — ABNORMAL HIGH (ref 65–99)
Glucose-Capillary: 147 mg/dL — ABNORMAL HIGH (ref 65–99)

## 2017-06-18 LAB — IRON AND TIBC
Iron: 66 ug/dL (ref 28–170)
SATURATION RATIOS: 32 % — AB (ref 10.4–31.8)
TIBC: 209 ug/dL — AB (ref 250–450)
UIBC: 143 ug/dL

## 2017-06-18 LAB — FERRITIN: Ferritin: 119 ng/mL (ref 11–307)

## 2017-06-18 MED ORDER — CARVEDILOL 6.25 MG PO TABS
6.2500 mg | ORAL_TABLET | Freq: Two times a day (BID) | ORAL | Status: DC
  Administered 2017-06-18 – 2017-06-20 (×5): 6.25 mg via ORAL
  Filled 2017-06-18 (×5): qty 1

## 2017-06-18 MED ORDER — FUROSEMIDE 80 MG PO TABS
80.0000 mg | ORAL_TABLET | Freq: Two times a day (BID) | ORAL | Status: DC
  Administered 2017-06-18 – 2017-06-20 (×6): 80 mg via ORAL
  Filled 2017-06-18 (×6): qty 1

## 2017-06-18 NOTE — Progress Notes (Signed)
PROGRESS NOTE    Cassandra [name]  GMW:[id number] DOB: Apr 18, 1946 DOA: 06/12/2017 PCP: Henrine Screwshacker, Robert, MD    Brief Narrative:  71 y.o.femalewith medical history significant of brittle DM2, insulin dependent, multiple admits for DKA, CKD stage 3 (now appears progressed to stage 4). Patient does not have a history of CHF, 2D echo last month demonstrated normal EF 65-70%. Rather her severe peripheral edema and Anasarca is due to albumins of 1.8 and nephrotic syndrome also the opinion of Dr. Hyman HopesWebb on consult note last month. Patient was recommended to go to SNF last month at discharge but elected to go home instead. She has had worsening generalized weakness at home, worsening peripheral edema and is now unable to be cared for by family and home health at home she says.  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 nephrology managing lasix.  Stable.   C diff colitis;  c diff PCR positive.  Still with multiples BM.  Started on oral vancomycin/  stop reglan.  Report less diarrhea today.   Anasarca;  On IV lasix.  Gabapentin was stopped.   UTI;  completed ceftriaxone day 3/3.   Malnutrition, hypoalbuminemia;  Nutrition consulted.   DM;  Had episode of hypoglycemia, Lantus was discontinue.  Low dose lantus resume.  CBG (last 3)   Recent Labs  06/17/17 2104 06/18/17 0744 06/18/17 1158  GLUCAP 142* 212* 167*   Hypothyroidism;  Continue with  synthroid.   Hypertension Cont carvedilol hydralazine.  Better controlled, increase carvedilol  DVT prophylaxis: heparin  Code Status: full code.  Family Communication: none at bedside.  Disposition Plan: remain inpatient , IV lasix.   Consultants:   Nephrology    Procedures:   none   Antimicrobials:   Ceftriaxone 6-30  Oral vancomycin 7-01   Subjective: She has no complaints today  Diarrhea improved.    Objective: Vitals:   06/17/17 1718 06/17/17 2100 06/18/17 0452 06/18/17 0915  BP: (!) 155/75 (!) 182/65 (!) 168/64 (!) 180/84  Pulse: 84 79 86 86  Resp: 18 18 17 18   Temp: 98.4 F (36.9 C) 99.3 F (37.4 C) 99.5 F (37.5 C) 97.9 F (36.6 C)  TempSrc: Oral Oral Oral Oral  SpO2: 98% 94% 90% 97%  Weight:  55.3 kg (122 lb)    Height:        Intake/Output Summary (Last 24 hours) at 06/18/17 1405 Last data filed at 06/18/17 1401  Gross per 24 hour  Intake              840 ml  Output             2356 ml  Net            -1516 ml   Filed Weights   06/14/17 2300 06/15/17 2041 06/17/17 2100  Weight: 61.3 kg (135 lb 1.6 oz) 60.2 kg (132 lb 11.2 oz) 55.3 kg (122 lb)    Examination:  General exam: awake, alert, cooperative,  NAD  Respiratory system: respiratory effort normal.  Cardiovascular system; S 1, S 2 RRR, trace edema LE.  Gastrointestinal system: Abdomen soft, , distended, nt Central nervous system: No focal deficit.  Extremities: Symmetric 5 x 5 power. Skin: No rashes, lesions or ulcers.   Data Reviewed: I have personally reviewed following labs and imaging studies  CBC:  Recent Labs Lab 06/12/17 1553 06/14/17 0847 06/16/17 0732  WBC 9.7 7.8 8.8  HGB 9.8* 10.8* 10.5*  HCT 30.6* 33.4* 33.7*  MCV 84.8 85.9 87.5  PLT 299 308 295   Basic Metabolic Panel:  Recent Labs Lab 06/14/17 0847 06/15/17 0419 06/16/17 0732 06/17/17 0627 06/18/17 0427  NA 136 136 137 137 136  K 3.5 3.1* 4.2 3.5 3.3*  CL 104 104 106 104 103  CO2 24 24 16* 24  25  GLUCOSE 253* 162* 420* 125* 212*  BUN 62* 64* 65* 66* 63*  CREATININE 2.86* 2.96* 3.06* 2.97* 2.97*  CALCIUM 7.9* 7.5* 7.7* 7.8* 7.5*  PHOS  --   --   --  4.7* 5.0*   GFR: Estimated Creatinine Clearance: 15.4 mL/min (A) (by C-G formula based on SCr of 2.97 mg/dL (H)). Liver Function Tests:  Recent Labs Lab 06/12/17 2050 06/14/17 0847 06/15/17 0419 06/16/17 0732 06/17/17 0627 06/18/17 0427  AST 13* 16 12* 11*  --   --   ALT 11* 11* 11* 11*  --   --   ALKPHOS 72 78 66 73  --   --   BILITOT 0.7 0.4 0.3 0.8  --   --   PROT 4.5* 4.6* 4.2* 4.7*  --   --   ALBUMIN 1.4* 1.5* 1.3* 1.5* 1.5* 1.4*   No results for input(s): LIPASE, AMYLASE in the last 168 hours. No results for input(s): AMMONIA in the last 168 hours. Coagulation Profile: No results for input(s): INR, PROTIME in the last 168 hours. Cardiac Enzymes: No results for input(s): CKTOTAL, CKMB, CKMBINDEX, TROPONINI in the last 168 hours. BNP (last 3 results) No results for input(s): PROBNP in the last 8760 hours. HbA1C: No results for input(s): HGBA1C in the last 72 hours. CBG:  Recent Labs Lab 06/17/17 1314 06/17/17 1658 06/17/17 2104 06/18/17 0744 06/18/17 1158  GLUCAP 186* 143* 142* 212* 167*   Lipid Profile: No results for input(s): CHOL, HDL, LDLCALC, TRIG, CHOLHDL, LDLDIRECT in the last 72 hours. Thyroid Function Tests: No results for input(s): TSH, T4TOTAL, FREET4, T3FREE, THYROIDAB in the last 72 hours. Anemia Panel:  Recent Labs  06/18/17 1022  FERRITIN 119  TIBC 209*  IRON 66   Sepsis Labs: No results for input(s): PROCALCITON, LATICACIDVEN in the last 168 hours.  Recent Results (from the past 240 hour(s))  C difficile quick scan w PCR reflex     Status: Abnormal   Collection Time: 06/15/17  7:02 AM  Result Value Ref Range Status   C Diff antigen POSITIVE (A) NEGATIVE Final   C Diff toxin NEGATIVE NEGATIVE Final   C Diff interpretation Results are indeterminate. See PCR results.  Final    Clostridium Difficile by PCR     Status: Abnormal   Collection Time: 06/15/17  7:02 AM  Result Value Ref Range Status   Toxigenic C Difficile by pcr POSITIVE (A) NEGATIVE Final    Comment: Positive for toxigenic C. difficile with little to no toxin production. Only treat if clinical presentation suggests symptomatic illness.   Radiology Studies: No results found.  Scheduled Meds: . amLODipine  5 mg Oral Daily  .  atorvastatin  20 mg Oral q1800  . carvedilol  3.125 mg Oral BID WC  . escitalopram  20 mg Oral Daily  . feeding supplement (NEPRO CARB STEADY)  237 mL Oral QHS  . feeding supplement (PRO-STAT SUGAR FREE 64)  30 mL Oral TID  . furosemide  80 mg Oral BID  . heparin  5,000 Units Subcutaneous Q8H  . hydrALAZINE  100 mg Oral Q8H  . insulin aspart  0-5 Units Subcutaneous TID WC  . insulin aspart  0-5 Units Subcutaneous QHS  . insulin aspart  2 Units Subcutaneous TID WC  . insulin glargine  7 Units Subcutaneous Daily  . iron polysaccharides  150 mg Oral BID  . levothyroxine  200 mcg Oral QAC breakfast  . vancomycin  125 mg Oral QID   Continuous Infusions:   LOS: 4 days   Time spent: 30 minutes.   Standley Dakins, MD [facility] Pager [phone]  If 7PM-7AM, please contact night-coverage [email or web address] Password [email or web address] 06/18/2017, 2:05 PM

## 2017-06-18 NOTE — Progress Notes (Signed)
Patient ID: Cassandra [name], female   DOB: 11/04/1946, 71 y.o.   MRN: [id number] S:No complaints O:BP (!) 180/84 (BP Location: Right Arm)   Pulse 86   Temp 97.9 F (36.6 C) (Oral)   Resp 18   Ht 5\' 4"  (1.626 m)   Wt 55.3 kg (122 lb)   SpO2 97%   BMI 20.94 kg/m   Intake/Output Summary (Last 24 hours) at 06/18/17 0945 Last data filed at 06/18/17 0914  Gross per 24 hour  Intake              840 ml  Output             1958 ml  Net            -1118 ml   Intake/Output: I/O last 3 completed shifts: In: 1082 [P.O.:1020; IV Piggyback:62] Out: 2314 [Urine:2305; Stool:9]  Intake/Output this shift:  Total I/O In: 240 [P.O.:240] Out: 550 [Urine:550] Weight change:  WUJ:WJXBJ elderly wf in nad CVS:no rub Resp:cta YNW:GNFAOZ Ext:1+ pretibial edema   Recent Labs Lab 06/12/17 1553 06/12/17 2050 06/14/17 0847 06/15/17 0419 06/16/17 0732 06/17/17 0627 06/18/17 0427  NA 134*  --  136 136 137 137 136  K 3.7  --  3.5 3.1* 4.2 3.5 3.3*  CL 104  --  104 104 106 104 103  CO2 21*  --  24 24 16* 24 25  GLUCOSE 362*  --  253* 162* 420* 125* 212*  BUN 56*  --  62* 64* 65* 66* 63*  CREATININE 2.74*  --  2.86* 2.96* 3.06* 2.97* 2.97*  ALBUMIN  --  1.4* 1.5* 1.3* 1.5* 1.5* 1.4*  CALCIUM 7.7*  --  7.9* 7.5* 7.7* 7.8* 7.5*  PHOS  --   --   --   --   --  4.7* 5.0*  AST  --  13* 16 12* 11*  --   --   ALT  --  11* 11* 11* 11*  --   --    Liver Function Tests:  Recent Labs Lab 06/14/17 0847 06/15/17 0419 06/16/17 0732 06/17/17 0627 06/18/17 0427  AST 16 12* 11*  --   --   ALT 11* 11* 11*  --   --   ALKPHOS 78 66 73  --   --   BILITOT 0.4 0.3 0.8  --   --   PROT 4.6* 4.2* 4.7*  --   --   ALBUMIN 1.5* 1.3* 1.5* 1.5* 1.4*   No results for input(s): LIPASE, AMYLASE in the last 168 hours. No results for input(s): AMMONIA in the last 168 hours. CBC:  Recent Labs Lab 06/12/17 1553 06/14/17 0847 06/16/17 0732  WBC 9.7 7.8 8.8  HGB 9.8* 10.8* 10.5*  HCT 30.6* 33.4* 33.7*  MCV  84.8 85.9 87.5  PLT 299 308 295   Cardiac Enzymes: No results for input(s): CKTOTAL, CKMB, CKMBINDEX, TROPONINI in the last 168 hours. CBG:  Recent Labs Lab 06/17/17 1247 06/17/17 1314 06/17/17 1658 06/17/17 2104 06/18/17 0744  GLUCAP 166* 186* 143* 142* 212*    Iron Studies: No results for input(s): IRON, TIBC, TRANSFERRIN, FERRITIN in the last 72 hours. Studies/Results: No results found. Marland Kitchen amLODipine  5 mg Oral Daily  . atorvastatin  20 mg Oral q1800  . carvedilol  3.125 mg Oral BID WC  . escitalopram  20 mg Oral Daily  . feeding supplement (NEPRO CARB STEADY)  237 mL Oral QHS  . feeding supplement (PRO-STAT SUGAR FREE 64)  30 mL Oral TID  . heparin  5,000 Units Subcutaneous Q8H  . hydrALAZINE  100 mg Oral Q8H  . insulin aspart  0-5 Units Subcutaneous TID WC  . insulin aspart  0-5 Units Subcutaneous QHS  . insulin aspart  2 Units Subcutaneous TID WC  . insulin glargine  7 Units Subcutaneous Daily  . iron polysaccharides  150 mg Oral BID  . levothyroxine  200 mcg Oral QAC breakfast  . vancomycin  125 mg Oral QID    BMET    Component Value Date/Time   NA 136 06/18/2017 0427   NA 141 11/04/2016   NA 136 06/15/2014 1507   K 3.3 (L) 06/18/2017 0427   K 3.3 (L) 06/15/2014 1507   CL 103 06/18/2017 0427   CL 99 06/15/2014 1507   CO2 25 06/18/2017 0427   CO2 25 06/15/2014 1507   GLUCOSE 212 (H) 06/18/2017 0427   GLUCOSE 134 (H) 06/15/2014 1507   BUN 63 (H) 06/18/2017 0427   BUN 26 (A) 11/04/2016   BUN 16 06/15/2014 1507   CREATININE 2.97 (H) 06/18/2017 0427   CREATININE 1.17 06/15/2014 1507   CALCIUM 7.5 (L) 06/18/2017 0427   CALCIUM 9.4 06/15/2014 1507   GFRNONAA 15 (L) 06/18/2017 0427   GFRNONAA 49 (L) 06/15/2014 1507   GFRAA 17 (L) 06/18/2017 0427   GFRAA 56 (L) 06/15/2014 1507   CBC    Component Value Date/Time   WBC 8.8 06/16/2017 0732   RBC 3.85 (L) 06/16/2017 0732   HGB 10.5 (L) 06/16/2017 0732   HGB 13.7 08/12/2014 1357   HCT 33.7 (L) 06/16/2017  0732   HCT 24.8 (L) 01/05/2017 1350   PLT 295 06/16/2017 0732   PLT 267 08/12/2014 1357   MCV 87.5 06/16/2017 0732   MCV 98 08/12/2014 1357   MCH 27.3 06/16/2017 0732   MCHC 31.2 06/16/2017 0732   RDW 14.8 06/16/2017 0732   RDW 12.6 08/12/2014 1357   LYMPHSABS 2.0 05/15/2017 0820   LYMPHSABS 2.5 08/12/2014 1357   MONOABS 0.6 05/15/2017 0820   MONOABS 0.7 08/12/2014 1357   EOSABS 0.2 05/15/2017 0820   EOSABS 0.4 08/12/2014 1357   BASOSABS 0.0 05/15/2017 0820   BASOSABS 0.1 08/12/2014 1357    Assessment/Plan:  1. 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 Has been seen multiple times as an inpatient, however she has not followed up as an outpatient. Diuresed well. Will need to f/u with Dr. Hyman HopesWebb as an outpatient when stable.  2. Nephrotic syndrome- responding to IV lasix, will change to po and follow UOP and Scr. 3. CHF- as above 4. HTN- recommend increasing coreg.  5. Abdominal pain/pyuria- on abx per primary svc.  6. Severe protein malnutrition- recommend protein supplements 7. Anemia of CKD stage 4- had low iron in the past will recheck and give IV Feraheme and possibly ESA if hgb doesn't improve. 8. Disposition- for discharge to SNF and will need f/u with [facility] due to her progressive CKD.  If she will be here for the rest of the week, we should consult [facility] for vascular access evaluation and placement given her poor follow up as an outpatient   Irena CordsJoseph A. Nealy Karapetian, MD [facility] [phone]

## 2017-06-18 NOTE — Plan of Care (Signed)
Problem: Education: Goal: Knowledge of [facility] Education information/materials will improve Outcome: Progressing POC reviewed with pt. and meds- pt. refusing Heparin SQ and instructed on med.

## 2017-06-18 NOTE — Progress Notes (Signed)
PT Cancellation Note  Patient Details Name: Cassandra [name] MRN: [id number] DOB: 07-Jun-1946   Cancelled Treatment:    Reason Eval/Treat Not Completed: Medical issues which prohibited therapy, pt nauseous and unable to sit up without increased nausea. Will defer until tomorrow.    Bubber Rothert L Soffia Doshier 06/18/2017, 3:23 PM

## 2017-06-19 DIAGNOSIS — E440 Moderate protein-calorie malnutrition: Secondary | ICD-10-CM

## 2017-06-19 LAB — GLUCOSE, CAPILLARY
GLUCOSE-CAPILLARY: 280 mg/dL — AB (ref 65–99)
GLUCOSE-CAPILLARY: 313 mg/dL — AB (ref 65–99)
GLUCOSE-CAPILLARY: 226 mg/dL — AB (ref 65–99)
Glucose-Capillary: 280 mg/dL — ABNORMAL HIGH (ref 65–99)

## 2017-06-19 LAB — RENAL FUNCTION PANEL
Albumin: 1.4 g/dL — ABNORMAL LOW (ref 3.5–5.0)
Anion gap: 7 (ref 5–15)
BUN: 65 mg/dL — ABNORMAL HIGH (ref 6–20)
CHLORIDE: 102 mmol/L (ref 101–111)
CO2: 28 mmol/L (ref 22–32)
Calcium: 7.4 mg/dL — ABNORMAL LOW (ref 8.9–10.3)
Creatinine, Ser: 3.11 mg/dL — ABNORMAL HIGH (ref 0.44–1.00)
GFR, EST AFRICAN AMERICAN: 17 mL/min — AB (ref >60)
GFR, EST NON AFRICAN AMERICAN: 14 mL/min — AB (ref >60)
Glucose, Bld: 232 mg/dL — ABNORMAL HIGH (ref 65–99)
POTASSIUM: 3.3 mmol/L — AB (ref 3.5–5.1)
Phosphorus: 5.4 mg/dL — ABNORMAL HIGH (ref 2.5–4.6)
Sodium: 137 mmol/L (ref 135–145)

## 2017-06-19 MED ORDER — INSULIN GLARGINE 100 UNIT/ML ~~LOC~~ SOLN
10.0000 [IU] | Freq: Every day | SUBCUTANEOUS | Status: DC
  Administered 2017-06-20: 10 [IU] via SUBCUTANEOUS
  Filled 2017-06-19: qty 0.1

## 2017-06-19 MED ORDER — INSULIN ASPART 100 UNIT/ML ~~LOC~~ SOLN: 3.0000 [IU] | Freq: Three times a day (TID) | SUBCUTANEOUS | Status: DC

## 2017-06-19 MED ORDER — INSULIN ASPART 100 UNIT/ML ~~LOC~~ SOLN
4.0000 [IU] | Freq: Three times a day (TID) | SUBCUTANEOUS | Status: DC
  Administered 2017-06-19 – 2017-06-20 (×4): 4 [IU] via SUBCUTANEOUS

## 2017-06-19 NOTE — Progress Notes (Signed)
Patient ID: Cassandra [name], female   DOB: 1946/05/02, 71 y.o.   MRN: [id number] S:I feel weak O:BP (!) 118/59   Pulse 78   Temp 98.8 F (37.1 C) (Oral)   Resp 18   Ht 5\' 4"  (1.626 m)   Wt 52.8 kg (116 lb 8 oz)   SpO2 96%   BMI 20.00 kg/m   Intake/Output Summary (Last 24 hours) at 06/19/17 1213 Last data filed at 06/19/17 1047  Gross per 24 hour  Intake              600 ml  Output              100 ml  Net              500 ml   Intake/Output: I/O last 3 completed shifts: In: 720 [P.O.:720] Out: 1350 [Urine:1350]  Intake/Output this shift:  Total I/O In: 120 [P.O.:120] Out: 0  Weight change: -2.495 kg (-5 lb 8 oz) WUJ:WJXBJ elderly WF in NAD CVS:no rub Resp:cta YNW:GNFAOZ Ext:1+ edema   Recent Labs Lab 06/12/17 1553 06/12/17 2050 06/14/17 0847 06/15/17 0419 06/16/17 0732 06/17/17 0627 06/18/17 0427 06/19/17 0430  NA 134*  --  136 136 137 137 136 137  K 3.7  --  3.5 3.1* 4.2 3.5 3.3* 3.3*  CL 104  --  104 104 106 104 103 102  CO2 21*  --  24 24 16* 24 25 28   GLUCOSE 362*  --  253* 162* 420* 125* 212* 232*  BUN 56*  --  62* 64* 65* 66* 63* 65*  CREATININE 2.74*  --  2.86* 2.96* 3.06* 2.97* 2.97* 3.11*  ALBUMIN  --  1.4* 1.5* 1.3* 1.5* 1.5* 1.4* 1.4*  CALCIUM 7.7*  --  7.9* 7.5* 7.7* 7.8* 7.5* 7.4*  PHOS  --   --   --   --   --  4.7* 5.0* 5.4*  AST  --  13* 16 12* 11*  --   --   --   ALT  --  11* 11* 11* 11*  --   --   --    Liver Function Tests:  Recent Labs Lab 06/14/17 0847 06/15/17 0419 06/16/17 0732 06/17/17 0627 06/18/17 0427 06/19/17 0430  AST 16 12* 11*  --   --   --   ALT 11* 11* 11*  --   --   --   ALKPHOS 78 66 73  --   --   --   BILITOT 0.4 0.3 0.8  --   --   --   PROT 4.6* 4.2* 4.7*  --   --   --   ALBUMIN 1.5* 1.3* 1.5* 1.5* 1.4* 1.4*   No results for input(s): LIPASE, AMYLASE in the last 168 hours. No results for input(s): AMMONIA in the last 168 hours. CBC:  Recent Labs Lab 06/12/17 1553 06/14/17 0847 06/16/17 0732  WBC  9.7 7.8 8.8  HGB 9.8* 10.8* 10.5*  HCT 30.6* 33.4* 33.7*  MCV 84.8 85.9 87.5  PLT 299 308 295   Cardiac Enzymes: No results for input(s): CKTOTAL, CKMB, CKMBINDEX, TROPONINI in the last 168 hours. CBG:  Recent Labs Lab 06/18/17 1158 06/18/17 1735 06/18/17 2300 06/19/17 0814 06/19/17 1148  GLUCAP 167* 147* 88 280* 280*    Iron Studies:  Recent Labs  06/18/17 1022  IRON 66  TIBC 209*  FERRITIN 119   Studies/Results: No results found. Marland Kitchen amLODipine  5 mg Oral Daily  . atorvastatin  20 mg Oral q1800  . carvedilol  6.25 mg Oral BID WC  . escitalopram  20 mg Oral Daily  . feeding supplement (NEPRO CARB STEADY)  237 mL Oral QHS  . feeding supplement (PRO-STAT SUGAR FREE 64)  30 mL Oral TID  . furosemide  80 mg Oral BID  . heparin  5,000 Units Subcutaneous Q8H  . hydrALAZINE  100 mg Oral Q8H  . insulin aspart  0-5 Units Subcutaneous TID WC  . insulin aspart  0-5 Units Subcutaneous QHS  . insulin aspart  2 Units Subcutaneous TID WC  . insulin glargine  7 Units Subcutaneous Daily  . iron polysaccharides  150 mg Oral BID  . levothyroxine  200 mcg Oral QAC breakfast  . vancomycin  125 mg Oral QID    BMET    Component Value Date/Time   NA 137 06/19/2017 0430   NA 141 11/04/2016   NA 136 06/15/2014 1507   K 3.3 (L) 06/19/2017 0430   K 3.3 (L) 06/15/2014 1507   CL 102 06/19/2017 0430   CL 99 06/15/2014 1507   CO2 28 06/19/2017 0430   CO2 25 06/15/2014 1507   GLUCOSE 232 (H) 06/19/2017 0430   GLUCOSE 134 (H) 06/15/2014 1507   BUN 65 (H) 06/19/2017 0430   BUN 26 (A) 11/04/2016   BUN 16 06/15/2014 1507   CREATININE 3.11 (H) 06/19/2017 0430   CREATININE 1.17 06/15/2014 1507   CALCIUM 7.4 (L) 06/19/2017 0430   CALCIUM 9.4 06/15/2014 1507   GFRNONAA 14 (L) 06/19/2017 0430   GFRNONAA 49 (L) 06/15/2014 1507   GFRAA 17 (L) 06/19/2017 0430   GFRAA 56 (L) 06/15/2014 1507   CBC    Component Value Date/Time   WBC 8.8 06/16/2017 0732   RBC 3.85 (L) 06/16/2017 0732    HGB 10.5 (L) 06/16/2017 0732   HGB 13.7 08/12/2014 1357   HCT 33.7 (L) 06/16/2017 0732   HCT 24.8 (L) 01/05/2017 1350   PLT 295 06/16/2017 0732   PLT 267 08/12/2014 1357   MCV 87.5 06/16/2017 0732   MCV 98 08/12/2014 1357   MCH 27.3 06/16/2017 0732   MCHC 31.2 06/16/2017 0732   RDW 14.8 06/16/2017 0732   RDW 12.6 08/12/2014 1357   LYMPHSABS 2.0 05/15/2017 0820   LYMPHSABS 2.5 08/12/2014 1357   MONOABS 0.6 05/15/2017 0820   MONOABS 0.7 08/12/2014 1357   EOSABS 0.2 05/15/2017 0820   EOSABS 0.4 08/12/2014 1357   BASOSABS 0.0 05/15/2017 0820   BASOSABS 0.1 08/12/2014 1357    Assessment/Plan:  1. 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 Has been seen multiple times as an inpatient, however she has not followed up as an outpatient. Diuresed well. Will need to f/u with Dr. Hyman HopesWebb as an outpatient when stable.  The appointment is on 07/16/17 at 2:30pm 2. Nephrotic syndrome- responding to IV lasix, will change to po and follow UOP and Scr. 3. CHF- as above 4. HTN- recommend increasing coreg.  5. Abdominal pain/pyuria- on abx per primary svc.  6. Severe protein malnutrition- recommend protein supplements 7. Anemia of CKD stage 4- had low iron in the past will recheck and give IV Feraheme and possibly ESA if hgb doesn't improve. 8. Disposition- for discharge to SNF and will need f/u with [facility] due to her progressive CKD.   Irena CordsJoseph A. Kalvin Buss, MD [facility] [phone]

## 2017-06-19 NOTE — Care Management Important Message (Signed)
Important Message  Patient Details  Name: Cassandra [name] MRN: [id number] Date of Birth: 06-29-1946   Medicare Important Message Given:  Yes    Lawerance Sabalebbie Ronak Duquette, RN 06/19/2017, 11:54 AM

## 2017-06-19 NOTE — Progress Notes (Signed)
PROGRESS NOTE    Cassandra [name]  WUJ:[id number] DOB: Apr 08, 1946 DOA: 06/12/2017 PCP: Henrine Screwshacker, Robert, MD    Brief Narrative:  71 y.o.femalewith medical history significant of brittle DM2, insulin dependent, multiple admits for DKA, CKD stage 3 (now appears progressed to stage 4). Patient does not have a history of CHF, 2D echo last month demonstrated normal EF 65-70%. Rather her severe peripheral edema and Anasarca is due to albumins of 1.8 and nephrotic syndrome also the opinion of Dr. Hyman HopesWebb on consult note last month. Patient was recommended to go to SNF last month at discharge but elected to go home instead. She has had worsening generalized weakness at home, worsening peripheral edema and is now unable to be cared for by family and home health at home she says.  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 nephrology managing lasix.  Stable.   C diff colitis;  c diff PCR positive.  Still with multiples BM.  Started on oral vancomycin/  stop reglan.  Report less diarrhea but still persists   Anasarca;  On oral lasix.  Gabapentin was stopped.   UTI;  completed ceftriaxone day 3/3.   Malnutrition, hypoalbuminemia;  Nutrition consulted.   DM;  Had episode of hypoglycemia, Low dose lantus resumed. Increased lantus and prandial novolog today CBG (last 3)   Recent Labs  06/18/17 2300 06/19/17 0814 06/19/17 1148  GLUCAP 88 280* 280*    Hypothyroidism;  Continue with synthroid.   Hypertension Cont carvedilol hydralazine.  Better controlled, increase carvedilol  DVT prophylaxis: heparin  Code Status: full code.  Family Communication: none at bedside.  Disposition Plan: SNF 7/6   Consultants:   Nephrology    Procedures:   none   Antimicrobials:   Ceftriaxone 6-30  Oral vancomycin 7-01   Subjective: She is weak and still having diarrhea x 7 times reported   Objective: Vitals:   06/18/17 2329 06/19/17 0339 06/19/17 1046 06/19/17 1145  BP: (!) 179/70 (!) 168/72 (!) 117/42 (!) 118/59  Pulse: 85 81 83 78  Resp: 18 18 18    Temp: 98.1 F (36.7 C) 98.6 F (37 C) 98.8 F (37.1 C)   TempSrc: Oral Oral Oral   SpO2: 94% 99% 96%   Weight: 52.8 kg (116 lb 8 oz)     Height:        Intake/Output Summary (Last 24 hours) at 06/19/17 1420 Last data filed at 06/19/17 1047  Gross per 24 hour  Intake              360 ml  Output                0 ml  Net              360 ml   Filed Weights   06/15/17 2041 06/17/17 2100 06/18/17 2329  Weight: 60.2 kg (132 lb 11.2 oz) 55.3 kg (122 lb) 52.8 kg (116 lb 8 oz)    Examination:  General exam: awake, alert, cooperative, NAD  Respiratory system: respiratory effort normal.  Cardiovascular system; S 1, S 2 RRR, trace edema LE.  Gastrointestinal system: Abdomen soft, , distended, nt Central nervous system: No focal deficit.  Extremities: Symmetric 5 x 5 power. Skin: No rashes, lesions or ulcers.   Data Reviewed: I have personally reviewed following labs and imaging studies  CBC:  Recent Labs Lab 06/12/17 1553 06/14/17 0847 06/16/17 0732  WBC 9.7 7.8 8.8  HGB 9.8* 10.8* 10.5*  HCT 30.6* 33.4* 33.7*  MCV 84.8 85.9 87.5  PLT 299 308 295   Basic Metabolic Panel:  Recent Labs Lab 06/15/17 0419 06/16/17 0732 06/17/17 0627 06/18/17 0427 06/19/17 0430  NA 136 137 137 136 137  K 3.1* 4.2 3.5 3.3* 3.3*  CL 104 106 104 103 102  CO2 24 16* 24 25 28    GLUCOSE 162* 420* 125* 212* 232*  BUN 64* 65* 66* 63* 65*  CREATININE 2.96* 3.06* 2.97* 2.97* 3.11*  CALCIUM 7.5* 7.7* 7.8* 7.5* 7.4*  PHOS  --   --  4.7* 5.0* 5.4*   GFR: Estimated Creatinine Clearance: 14.2 mL/min (A) (by C-G formula based on SCr of 3.11 mg/dL (H)). Liver Function Tests:  Recent Labs Lab 06/12/17 2050 06/14/17 0847 06/15/17 0419 06/16/17 0732 06/17/17 0627 06/18/17 0427 06/19/17 0430  AST 13* 16 12* 11*  --   --   --   ALT 11* 11* 11* 11*  --   --   --   ALKPHOS 72 78 66 73  --   --   --   BILITOT 0.7 0.4 0.3 0.8  --   --   --   PROT 4.5* 4.6* 4.2* 4.7*  --   --   --   ALBUMIN 1.4* 1.5* 1.3* 1.5* 1.5* 1.4* 1.4*   No results for input(s): LIPASE, AMYLASE in the last 168 hours. No results for input(s): AMMONIA in the last 168 hours. Coagulation Profile: No results for input(s): INR, PROTIME in the last 168 hours. Cardiac Enzymes: No results for input(s): CKTOTAL, CKMB, CKMBINDEX, TROPONINI in the last 168 hours. BNP (last 3 results) No results for input(s): PROBNP in the last 8760 hours. HbA1C: No results for input(s): HGBA1C in the last 72 hours. CBG:  Recent Labs Lab 06/18/17 1158 06/18/17 1735 06/18/17 2300 06/19/17 0814 06/19/17 1148  GLUCAP 167* 147* 88 280* 280*   Lipid Profile: No results for input(s): CHOL, HDL, LDLCALC, TRIG, CHOLHDL, LDLDIRECT in the last 72 hours. Thyroid Function Tests: No results for input(s): TSH, T4TOTAL, FREET4, T3FREE, THYROIDAB in the last 72 hours. Anemia Panel:  Recent Labs  06/18/17 1022  FERRITIN 119  TIBC 209*  IRON 66   Sepsis Labs: No results for input(s): PROCALCITON, LATICACIDVEN in the last 168 hours.  Recent Results (from the past 240 hour(s))  C difficile quick scan w PCR reflex     Status: Abnormal   Collection Time: 06/15/17  7:02 AM  Result Value Ref Range Status   C Diff antigen POSITIVE (A) NEGATIVE Final   C Diff toxin NEGATIVE NEGATIVE Final   C Diff interpretation Results are  indeterminate. See PCR results.  Final  Clostridium Difficile by PCR     Status: Abnormal   Collection Time: 06/15/17  7:02 AM  Result Value Ref Range Status   Toxigenic C Difficile by pcr POSITIVE (A) NEGATIVE Final    Comment: Positive for toxigenic C. difficile with little to no toxin production. Only treat if clinical presentation  suggests symptomatic illness.   Radiology Studies: No results found.  Scheduled Meds: . amLODipine  5 mg Oral Daily  . atorvastatin  20 mg Oral q1800  . carvedilol  6.25 mg Oral BID WC  . escitalopram  20 mg Oral Daily  . feeding supplement (NEPRO CARB STEADY)  237 mL Oral QHS  . feeding supplement (PRO-STAT SUGAR FREE 64)  30 mL Oral TID  . furosemide  80 mg Oral BID  . heparin  5,000 Units Subcutaneous Q8H  . hydrALAZINE  100 mg Oral Q8H  . insulin aspart  0-5 Units Subcutaneous TID WC  . insulin aspart  0-5 Units Subcutaneous QHS  . insulin aspart  2 Units Subcutaneous TID WC  . insulin glargine  7 Units Subcutaneous Daily  . iron polysaccharides  150 mg Oral BID  . levothyroxine  200 mcg Oral QAC breakfast  . vancomycin  125 mg Oral QID   Continuous Infusions:   LOS: 5 days   Time spent: 27 minutes.   Standley Dakins, MD [facility] Pager [phone]  If 7PM-7AM, please contact night-coverage [email or web address] Password [email or web address] 06/19/2017, 2:20 PM

## 2017-06-19 NOTE — Progress Notes (Signed)
Physical Therapy Treatment Patient Details Name: Cassandra [name] MRN: [id number] DOB: [birth year] Today's Date: 06/19/2017    History of Present Illness 71 y.o.femalewith medical history significant of brittle DM2, insulin dependent, multiple admits for DKA, CKD stage 3 (now appears progressed to stage 4) presenting with acute on chronic anasarca and fall at home.    PT Comments    Pt continues to be limited with mobility due to pain and complaints of fatigue. Feel pt may be self-limiting and has poor motivation to improve mobility. Pt does, however, agree to move into recliner this session and defers any gait activities. Pt will benefit from SNF at discharge in order to maximize her outcomes before returning home.     Follow Up Recommendations  SNF;Supervision/Assistance - 24 hour     Equipment Recommendations  None recommended by PT    Recommendations for Other Services       Precautions / Restrictions Precautions Precautions: Fall Restrictions Weight Bearing Restrictions: No    Mobility  Bed Mobility Overal bed mobility: Needs Assistance Bed Mobility: Supine to Sit     Supine to sit: Min guard;HOB elevated     General bed mobility comments: moving slowly, but able to sit EOB with use of railings.   Transfers Overall transfer level: Needs assistance Equipment used: Rolling walker (2 wheeled) Transfers: Sit to/from Stand Sit to Stand: Min assist Stand pivot transfers: Mod assist       General transfer comment: assist to rise and stabilize.    Ambulation/Gait             General Gait Details: Did not attempt this session as pt has increased pain in bilateral LE's and deferred gait   Stairs            Wheelchair Mobility    Modified Rankin (Stroke Patients Only)       Balance Overall balance assessment: Needs assistance Sitting-balance support: No upper extremity supported;Feet supported Sitting balance-Leahy Scale: Good     Standing  balance support: Bilateral upper extremity supported Standing balance-Leahy Scale: Poor Standing balance comment: reliant on RW for stability in standing                            Cognition Arousal/Alertness: Awake/alert Behavior During Therapy: Flat affect Overall Cognitive Status: No family/caregiver present to determine baseline cognitive functioning                                        Exercises General Exercises - Lower Extremity Ankle Circles/Pumps: AROM;Both;10 reps;Supine Heel Slides: AROM;Both;10 reps;Supine    General Comments General comments (skin integrity, edema, etc.): pt wanting to go home. Might resist SNF placement      Pertinent Vitals/Pain Pain Assessment: Faces Faces Pain Scale: Hurts little more Pain Location: bilateral LE's with weight bearing Pain Descriptors / Indicators: Aching Pain Intervention(s): Monitored during session;Repositioned;Limited activity within patient's tolerance    Home Living                      Prior Function            PT Goals (current goals can now be found in the care plan section) Acute Rehab PT Goals Patient Stated Goal: pt is okay with getting out of bed to recliner Progress towards PT goals: Progressing toward goals    Frequency  Min 2X/week      PT Plan Current plan remains appropriate    Co-evaluation              AM-PAC PT "6 Clicks" Daily Activity  Outcome Measure  Difficulty turning over in bed (including adjusting bedclothes, sheets and blankets)?: None Difficulty moving from lying on back to sitting on the side of the bed? : A Little Difficulty sitting down on and standing up from a chair with arms (e.g., wheelchair, bedside commode, etc,.)?: Total Help needed moving to and from a bed to chair (including a wheelchair)?: A Little Help needed walking in hospital room?: A Little Help needed climbing 3-5 steps with a railing? : A Lot 6 Click Score:  16    End of Session Equipment Utilized During Treatment: Gait belt Activity Tolerance: Patient tolerated treatment well;Patient limited by pain Patient left: in chair;with call bell/phone within reach;with chair alarm set Nurse Communication: Mobility status PT Visit Diagnosis: Unsteadiness on feet (R26.81);Muscle weakness (generalized) (M62.81)     Time: [id number] PT Time Calculation (min) (ACUTE ONLY): 13 min  Charges:  $Therapeutic Activity: 8-22 mins                    G Codes:       Colin BroachSabra M. Edem Tiegs PT, DPT  [phone]    Ruel FavorsSabra Aletha HalimMarie Ladarrell Cornwall 06/19/2017, 1:49 PM

## 2017-06-20 LAB — RENAL FUNCTION PANEL
Albumin: 1.5 g/dL — ABNORMAL LOW (ref 3.5–5.0)
Anion gap: 8 (ref 5–15)
BUN: 68 mg/dL — AB (ref 6–20)
CHLORIDE: 104 mmol/L (ref 101–111)
CO2: 28 mmol/L (ref 22–32)
CREATININE: 2.91 mg/dL — AB (ref 0.44–1.00)
Calcium: 7.5 mg/dL — ABNORMAL LOW (ref 8.9–10.3)
GFR calc Af Amer: 18 mL/min — ABNORMAL LOW (ref >60)
GFR calc non Af Amer: 15 mL/min — ABNORMAL LOW (ref >60)
GLUCOSE: 112 mg/dL — AB (ref 65–99)
Phosphorus: 4.9 mg/dL — ABNORMAL HIGH (ref 2.5–4.6)
Potassium: 3.2 mmol/L — ABNORMAL LOW (ref 3.5–5.1)
Sodium: 140 mmol/L (ref 135–145)

## 2017-06-20 LAB — GLUCOSE, CAPILLARY
GLUCOSE-CAPILLARY: 259 mg/dL — AB (ref 65–99)
Glucose-Capillary: 162 mg/dL — ABNORMAL HIGH (ref 65–99)
Glucose-Capillary: 179 mg/dL — ABNORMAL HIGH (ref 65–99)

## 2017-06-20 MED ORDER — CARVEDILOL 6.25 MG PO TABS: 6.2500 mg | ORAL_TABLET | Freq: Two times a day (BID) | ORAL | Status: AC

## 2017-06-20 MED ORDER — ESCITALOPRAM OXALATE 20 MG PO TABS: 20.0000 mg | ORAL_TABLET | ORAL | Status: AC

## 2017-06-20 MED ORDER — FUROSEMIDE 20 MG PO TABS: 80.0000 mg | ORAL_TABLET | Freq: Two times a day (BID) | ORAL | 0 refills | Status: DC

## 2017-06-20 MED ORDER — NEPRO/CARBSTEADY PO LIQD: 237.0000 mL | Freq: Every day | ORAL | 0 refills | Status: AC

## 2017-06-20 MED ORDER — ATORVASTATIN CALCIUM 20 MG PO TABS: 20.0000 mg | ORAL_TABLET | Freq: Every day | ORAL | Status: AC

## 2017-06-20 MED ORDER — POLYSACCHARIDE IRON COMPLEX 150 MG PO CAPS: 150.0000 mg | ORAL_CAPSULE | Freq: Every day | ORAL | Status: AC

## 2017-06-20 MED ORDER — POTASSIUM CHLORIDE CRYS ER 20 MEQ PO TBCR
40.0000 meq | EXTENDED_RELEASE_TABLET | Freq: Once | ORAL | Status: AC
  Administered 2017-06-20: 40 meq via ORAL
  Filled 2017-06-20: qty 2

## 2017-06-20 MED ORDER — ALPRAZOLAM 0.25 MG PO TABS
0.1250 mg | ORAL_TABLET | Freq: Once | ORAL | Status: AC | PRN
  Administered 2017-06-20: 0.125 mg via ORAL
  Filled 2017-06-20: qty 1

## 2017-06-20 MED ORDER — PRO-STAT SUGAR FREE PO LIQD: 30.0000 mL | Freq: Three times a day (TID) | ORAL | 0 refills | Status: AC

## 2017-06-20 MED ORDER — POTASSIUM CHLORIDE CRYS ER 20 MEQ PO TBCR: 20.0000 meq | EXTENDED_RELEASE_TABLET | Freq: Every day | ORAL | 0 refills | Status: DC

## 2017-06-20 MED ORDER — VANCOMYCIN 50 MG/ML ORAL SOLUTION: 125.0000 mg | Freq: Four times a day (QID) | ORAL | 0 refills | Status: AC

## 2017-06-20 NOTE — Progress Notes (Signed)
Occupational Therapy Treatment Patient Details Name: Cassandra [name] MRN: [id number] DOB: 11/27/1946 Today's Date: 06/20/2017    History of present illness 71 y.o.femalewith medical history significant of brittle DM2, insulin dependent, multiple admits for DKA, CKD stage 3 (now appears progressed to stage 4) presenting with acute on chronic anasarca and fall at home.   OT comments  Pt progressing towards goals, completing brief room level functional mobility using RW with overall ModA throughout. Pt [facility], setup assist for seated grooming ADLs (decling standing grooming). Pt reporting wishes to return home vs SNF with therapist educating Pt on benefits of ST rehab in SNF setting to increase safety/independence at home. Feel Pt will benefit from continued acute OT services and from continued postacute OT services prior to return home to maximize Pt's overall endurance, safety, and independence with ADLs and functional mobility. POC remains appropriate.    Follow Up Recommendations  SNF    Equipment Recommendations  3 in 1 bedside commode          Precautions / Restrictions Precautions Precautions: Fall Restrictions Weight Bearing Restrictions: No       Mobility Bed Mobility Overal bed mobility: Needs Assistance Bed Mobility: Supine to Sit     Supine to sit: Min assist     General bed mobility comments: assist for trunk support   Transfers Overall transfer level: Needs assistance Equipment used: Rolling walker (2 wheeled) Transfers: Sit to/from Stand Sit to Stand: Mod assist Stand pivot transfers: Mod assist       General transfer comment: assist for rise and descent; assist to steady during room level mobility using RW     Balance Overall balance assessment: Needs assistance Sitting-balance support: No upper extremity supported;Feet supported Sitting balance-Leahy Scale: Good     Standing balance support: Bilateral upper extremity  supported Standing balance-Leahy Scale: Poor Standing balance comment: reliant on RW for stability in standing                           ADL either performed or assessed with clinical judgement   ADL Overall ADL's : Needs assistance/impaired     Grooming: Wash/dry hands;Wash/dry face;Oral care;Set up;Sitting           Upper Body Dressing : Moderate assistance;Sitting       Toilet Transfer: Moderate assistance;Ambulation;RW;BSC   Toileting- Clothing Manipulation and Hygiene: Moderate assistance;Sit to/from stand Toileting - Clothing Manipulation Details (indicate cue type and reason): assist with peri care      Functional mobility during ADLs: Moderate assistance;Rolling walker General ADL Comments: Pt completing short room level functional mobility using RW with [facility] to [facility], completed functional mobility to recliner using RW with MinA where Pt completed grooming ADLs with setup (declined completing in standing).                        Cognition Arousal/Alertness: Awake/alert Behavior During Therapy: Flat affect Overall Cognitive Status: No family/caregiver present to determine baseline cognitive functioning                                                      General Comments      Pertinent Vitals/ Pain       Faces Pain Scale: Hurts little more  Pain Location: bilateral LE's with weight bearing Pain Descriptors / Indicators: Aching Pain Intervention(s): Monitored during session;Repositioned;Limited activity within patient's tolerance                                                          Frequency  Min 2X/week        Progress Toward Goals  OT Goals(current goals can now be found in the care plan section)  Progress towards OT goals: Progressing toward goals  Acute Rehab OT Goals Patient Stated Goal: to return home (vs SNF) OT Goal Formulation: With patient Time For Goal  Achievement: 06/27/17 Potential to Achieve Goals: Good  Plan Discharge plan remains appropriate                     AM-PAC PT "6 Clicks" Daily Activity     Outcome Measure   Help from another person eating meals?: None Help from another person taking care of personal grooming?: A Little Help from another person toileting, which includes using toliet, bedpan, or urinal?: A Lot Help from another person bathing (including washing, rinsing, drying)?: A Lot Help from another person to put on and taking off regular upper body clothing?: A Little Help from another person to put on and taking off regular lower body clothing?: A Lot 6 Click Score: 16    End of Session Equipment Utilized During Treatment: Rolling walker  OT Visit Diagnosis: Unsteadiness on feet (R26.81);Other abnormalities of gait and mobility (R26.89);History of falling (Z91.81);Muscle weakness (generalized) (M62.81)   Activity Tolerance Patient tolerated treatment well   Patient Left in chair;with call bell/phone within reach;with chair alarm set             Time: 1450-1518 OT Time Calculation (min): 28 min  Charges: OT General Charges $OT Visit: 1 Procedure OT Treatments $Self Care/Home Management : 23-37 mins  Marcy Siren, OT Pager [phone] 06/20/2017    Orlando Penner 06/20/2017, 4:12 PM

## 2017-06-20 NOTE — Discharge Instructions (Signed)
Follow with Primary MD  Henrine Screwshacker, Robert, MD  and other consultant's as instructed your Hospitalist MD  Please get a complete blood count and chemistry panel checked by your Primary MD at your next visit, and again as instructed by your Primary MD.  Get Medicines reviewed and adjusted: Please take all your medications with you for your next visit with your Primary MD  Laboratory/radiological data: Please request your Primary MD to go over all hospital tests and procedure/radiological results at the follow up, please ask your Primary MD to get all Hospital records sent to his/her office.  In some cases, they will be blood work, cultures and biopsy results pending at the time of your discharge. Please request that your primary care M.D. follows up on these results.  Also Note the following: If you experience worsening of your admission symptoms, develop shortness of breath, life threatening emergency, suicidal or homicidal thoughts you must seek medical attention immediately by calling 911 or calling your MD immediately  if symptoms less severe.  You must read complete instructions/literature along with all the possible adverse reactions/side effects for all the Medicines you take and that have been prescribed to you. Take any new Medicines after you have completely understood and accpet all the possible adverse reactions/side effects.   Do not drive when taking Pain medications or sleeping medications (Benzodaizepines)  Do not take more than prescribed Pain, Sleep and Anxiety Medications. It is not advisable to combine anxiety,sleep and pain medications without talking with your primary care practitioner  Special Instructions: If you have smoked or chewed Tobacco  in the last 2 yrs please stop smoking, stop any regular Alcohol  and or any Recreational drug use.  Wear Seat belts while driving.  Please note: You were cared for by a hospitalist during your hospital stay. Once you are discharged,  your primary care physician will handle any further medical issues. Please note that NO REFILLS for any discharge medications will be authorized once you are discharged, as it is imperative that you return to your primary care physician (or establish a relationship with a primary care physician if you do not have one) for your post hospital discharge needs so that they can reassess your need for medications and monitor your lab values.

## 2017-06-20 NOTE — Clinical Social Work Placement (Signed)
   CLINICAL SOCIAL WORK PLACEMENT  NOTE 06/20/17 - DISCHARGED TO [facility] VIA AMBULANCE  Date:  06/20/2017  Patient Details  Name: Cassandra [name] MRN: [id number] Date of Birth: [birth year]  Clinical Social Work is seeking post-discharge placement for this patient at the Skilled  Nursing Facility level of care (*CSW will initial, date and re-position this form in  chart as items are completed):  No (Husband provided facility preference by phone)   [facility]'s list of facilities offering this level of care within the geographic area requested by the patient (or if unable, by the patient's family).  Yes   Patient/family informed of their freedom to choose among providers that offer the needed level of care, that participate in Medicare, Medicaid or managed care program needed by the patient, have an available bed and are willing to accept the patient.  No   Patient/family informed of 's ownership interest in [facility] and [facility], as well as of the fact that they are under no obligation to receive care at these facilities.  PASRR submitted to EDS on 06/17/17     PASRR number received on 06/18/17 ([id number] E effective 7/4 - 07/18/17)     Existing PASRR number confirmed on       FL2 transmitted to all facilities in geographic area requested by pt/family on 06/16/17     FL2 transmitted to all facilities within larger geographic area on       Patient informed that his/her managed care company has contracts with or will negotiate with certain facilities, including the following:        Yes   Patient/family informed of bed offers received.  [facility]     Physician recommends and patient chooses bed at      Patient to be transferred to [facility] on 06/20/17.  Patient to be transferred to facility by Ambulance     Patient family notified on 06/20/17 of transfer.  Name  of family member notified:  [facility] Get[facility]     PHYSICIAN       Additional Comment:    _______________________________________________ Cristobal Goldmannrawford, Jailani Hogans Bradley, LCSW 06/20/2017, 5:13 PM

## 2017-06-20 NOTE — Progress Notes (Signed)
[facility] Progress Note    Assessment/ Plan:   1. 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 Has been seen multiple times as an inpatient, however she has not followed up as an outpatient. Diuresed well.  - No acute indication for HD -> stable for DC from renal standpoing.  Will need to f/u with Dr. Hyman Hopes as an outpatient when stable.  The appointment is on 07/16/17 at 2:30pm  2. Nephrotic syndrome- responding to IV lasix, will changeto po and follow UOP and Scr. 3. CHF- as above 4. HTN- recommend increasing coreg.  5. Abdominal pain/pyuria- on abx per primary svc.  6. Severe protein malnutrition- recommend protein supplements 7. Anemia of CKD stage 4- had low iron in the past will recheck and give IV Feraheme and possibly ESA if hgb doesn't improve. 8. Disposition- for discharge to SNF and will need f/u with [facility] due to her progressive CKD.  Subjective:   No complaints. She's feeling much better. Denies f/c/n/v/dyspnea   Objective:   BP (!) 184/74   Pulse 86   Temp 98 F (36.7 C) (Oral)   Resp 18   Ht 5\' 4"  (1.626 m)   Wt 52.8 kg (116 lb 8 oz)   SpO2 94%   BMI 20.00 kg/m   Intake/Output Summary (Last 24 hours) at 06/20/17 1519 Last data filed at 06/20/17 1412  Gross per 24 hour  Intake             1020 ml  Output                0 ml  Net             1020 ml   Weight change:   Physical Exam: WUJ:WJXBJ elderly WF in NAD, very pleasant CVS:no rub Resp:cta YNW:GNFAOZ Ext:1+ edema  Imaging: No results found.  Labs: BMET  Recent Labs Lab 06/14/17 0847 06/15/17 0419 06/16/17 0732 06/17/17 0627 06/18/17 0427 06/19/17 0430 06/20/17 0438  NA 136 136 137 137 136 137 140  K 3.5 3.1* 4.2 3.5 3.3* 3.3* 3.2*  CL 104 104 106 104 103 102 104  CO2 24 24 16* 24 25 28 28   GLUCOSE 253* 162* 420* 125* 212* 232* 112*  BUN 62* 64* 65* 66* 63* 65* 68*  CREATININE 2.86* 2.96* 3.06* 2.97* 2.97* 3.11* 2.91*   CALCIUM 7.9* 7.5* 7.7* 7.8* 7.5* 7.4* 7.5*  PHOS  --   --   --  4.7* 5.0* 5.4* 4.9*   CBC  Recent Labs Lab 06/14/17 0847 06/16/17 0732  WBC 7.8 8.8  HGB 10.8* 10.5*  HCT 33.4* 33.7*  MCV 85.9 87.5  PLT 308 295    Medications:    . amLODipine  5 mg Oral Daily  . atorvastatin  20 mg Oral q1800  . carvedilol  6.25 mg Oral BID WC  . escitalopram  20 mg Oral Daily  . feeding supplement (NEPRO CARB STEADY)  237 mL Oral QHS  . feeding supplement (PRO-STAT SUGAR FREE 64)  30 mL Oral TID  . furosemide  80 mg Oral BID  . heparin  5,000 Units Subcutaneous Q8H  . hydrALAZINE  100 mg Oral Q8H  . insulin aspart  0-5 Units Subcutaneous TID WC  . insulin aspart  0-5 Units Subcutaneous QHS  . insulin aspart  4 Units Subcutaneous TID WC  . insulin glargine  10 Units Subcutaneous Daily  . iron polysaccharides  150 mg Oral BID  . levothyroxine  200 mcg Oral QAC breakfast  . vancomycin  125 mg Oral QID      Paulene FloorJames Kenndra Morris, MD 06/20/2017, 3:19 PM

## 2017-06-20 NOTE — Progress Notes (Signed)
Patient stated that she was anxious and jerking. MD notified. Orders placed. Will continue to monitor.

## 2017-06-20 NOTE — Clinical Social Work Note (Signed)
Clinical Social Work Assessment  Patient Details  Name: Cassandra [name] MRN: [id number] Date of Birth: Jun 14, 1946  Date of referral:  06/16/17               Reason for consult:  Discharge Planning                Permission sought to share information with:  Family Supports Permission granted to share information::  Yes, Verbal Permission Granted  Name::     Maudie Mercuryharles Castles and Gar GibbonNicole King  Agency::     Relationship::  Husband and daughter  Contact Information:  [phone]-husband; [phone] - daughter  Housing/Transportation Living arrangements for the past 2 months:  Apartment Source of Information:  Patient, Spouse Patient Interpreter Needed:  None Criminal Activity/Legal Involvement Pertinent to Current Situation/Hospitalization:  No - Comment as needed Significant Relationships:  Adult Children, Spouse Lives with:  Spouse Do you feel safe going back to the place where you live?  No (Patient in agreement with ST rehab prior to returning home) Need for family participation in patient care:  Yes (Comment)  Care giving concerns:  Patient agreeable to ST rehab to assist her in getting stronger prior to returning home.  Social Worker assessment / plan:  CSW talked with patient at the bedside regarding discharge disposition and recommendation of ST rehab. [name] was sitting up in bed and was alert, oriented and open to talking with CSW although she reported having a bad headache. Patient reported that she has been to [place]hab before [place] and also [facility] (per patient she [facility]) and gave CSW permission to contact her daughter Joni Reiningicole. When she did not answer, patient requested that her husband be contacted regarding a facility choice.  Call made to Mr. Basques and his preference was [facility] as patient has been there before and he liked it.   Employment status:  Retired Medical illustratornsurance information:  Managed Medicare ([employer] Medicare) PT Recommendations:  Skilled  Nursing Facility Information / Referral to community resources:  Skilled Nursing Facility (SNF list not needed as patient's husband provided CSW with facility preference)  Patient/Family's Response to care:  Patient reported no concerns regarding her care during hospitalization.  Patient/Family's Understanding of and Emotional Response to Diagnosis, Current Treatment, and Prognosis:  Not discussed.  Emotional Assessment Appearance:  Appears stated age Attitude/Demeanor/Rapport:  Other (Appropriate) Affect (typically observed):  Appropriate Orientation:  Oriented to Self, Oriented to Place, Oriented to  Time, Oriented to Situation Alcohol / Substance use:  Tobacco Use, Alcohol Use, Illicit Drugs (Patient reported that she quit smoking many years ago and does not smoke or use illicit drugs) Psych involvement (Current and /or in the community):  No (Comment)  Discharge Needs  Concerns to be addressed:  Discharge Planning Concerns Readmission within the last 30 days:  No Current discharge risk:  None Barriers to Discharge:  No Barriers Identified   Cristobal GoldmannCrawford, Graycen Degan Bradley, LCSW 06/20/2017, 5:05 PM

## 2017-06-20 NOTE — Care Management Note (Signed)
Case Management Note  Patient Details  Name: Merril AbbeBillie A Rister MRN: [id number] Date of Birth: 1946/05/16  Subjective/Objective:              [facility] as facilitated by [place].       Action/Plan:   Expected Discharge Date:  06/20/17               Expected Discharge Plan:  Skilled Nursing Facility  In-House Referral:  Clinical Social Work  Discharge planning Services  CM Consult  Post Acute Care Choice:  Home Health, Resumption of Svcs/PTA Provider Choice offered to:  Patient  DME Arranged:  N/A DME Agency:  NA  HH Arranged:  NA HH [facility]  Status of Service:  Completed, signed off  If discussed at [facility] Meetings, dates discussed:    Additional Comments:  Lawerance SabalDebbie Azriella Mattia, RN 06/20/2017, 2:27 PM

## 2017-06-20 NOTE — Progress Notes (Signed)
Report called and given to RN at [facility].  

## 2017-06-20 NOTE — Discharge Summary (Signed)
Physician Discharge Summary  Cassandra [name]:[id number] DOB: 1946/09/27 DOA: 06/12/2017  PCP: Cassandra Screws, MD Nephrologist; Dr. Gala [name]  Admit date: 06/12/2017 Discharge date: 06/20/2017  Admitted From: Home  Disposition: SNF  Recommendations for Outpatient Follow-up:  1. Follow up with PCP in 2 weeks 2. Please obtain BMP/CBC in one week 3. Monitor blood sugars 3 times per day and adjust therapy as needed 4. Take oral vancomycin 06/21/17 - 06/29/17 then STOP  Discharge Condition: STABLE   CODE STATUS: FULL    Brief Hospitalization Summary: Please see all hospital notes, images, labs for full details of the hospitalization. HPI: Cassandra [name] is a 71 y.o. female with medical history significant of brittle DM2, insulin dependent, multiple admits for DKA, CKD stage 3 (now appears progressed to stage 4).  Patient does not have a history of CHF, 2D echo last month demonstrated normal EF 65-70%.  Rather her severe peripheral edema and Anasarca is due to albumins of 1.8 and nephrotic syndrome also the opinion of Dr. Hyman Hopes on consult note last month.  Patient was recommended to go to SNF last month at discharge but elected to go home instead.  She has had worsening generalized weakness at home, worsening peripheral edema and is now unable to be cared for by family and home health at home she says.  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 nephrology managing lasix.  Stable.   C diff colitis;  c diff PCR positive.  BMs have slowed down and more formed now Started on oral vancomycin with good results, take for full 14 days, continue 7/7 -06/29/17 then STOP   Anasarca;  On oral lasix.  Gabapentin was stopped.   UTI;  completed ceftriaxone day 3/3.   Malnutrition, hypoalbuminemia;  Nutrition consulted.   DM type 2 insulin requiring  Had episode of hypoglycemia, Low dose lantus resumed. Increased lantus and prandial novolog today CBG (last 3)   Recent Labs  06/19/17 2023 06/20/17 0757 06/20/17 1211  GLUCAP 226* 162* 259*   Hypothyroidism;  Continue with synthroid.   Hypertension Cont carvedilol hydralazine.  Better controlled, increase carvedilol  DVT prophylaxis: heparin  Code Status: full code.  Family Communication: none at bedside.  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) Active Problems:   Insulin dependent type 2 diabetes mellitus, uncontrolled (HCC)   Uncontrolled Hypothyroidism   HLD (hyperlipidemia)   CKD stage 4 due to type 2 diabetes mellitus (HCC)   Anasarca associated with disorder of kidney   Malnutrition of moderate degree   Acute renal failure (HCC)   Acute renal failure (ARF) (HCC)  Discharge Instructions: Discharge Instructions    Increase activity slowly    Complete by:  As directed  Allergies as of 06/20/2017      Reactions   Alprazolam Other (See Comments)   Family preference, for patient to not take med   Percocet [oxycodone-acetaminophen] Other (See Comments)   Family preference, for patient to not take med   Codeine Diarrhea, Nausea And Vomiting   Doxycycline Diarrhea, Nausea And Vomiting   Hydrocodone Nausea And Vomiting   Omnicef  [cefdinir] Nausea Only, Other (See Comments)   Constipation, tolerated Zosyn   Tramadol Other (See Comments)   Pt hallucinates when taking medication. Family request not to give medication to pt    Augmentin [amoxicillin-pot Clavulanate] Hives, Rash   Has patient had a PCN reaction causing immediate rash, facial/tongue/throat swelling, SOB or lightheadedness with hypotension: Yes Has patient had a PCN reaction causing severe rash involving mucus membranes or skin necrosis: Yes Did a PCN reaction that required hospitalization No Did PCN reaction occurring within the last 10 years: Yes If all of the above answers are "NO", then may proceed with Cephalosporin use. Pt states she has taken penicillin since, and was ok with it...   Ciprofloxacin Hives   Tolerated LVQ in 12/2016      Medication List    STOP taking these medications   gabapentin 100 MG capsule Commonly known as:  NEURONTIN   metoCLOPramide 5 MG tablet Commonly known as:  REGLAN     TAKE these medications   amLODipine 5 MG tablet Commonly known as:  NORVASC Take 1 tablet (5 mg total) by mouth daily.   atorvastatin 20 MG tablet Commonly known as:  LIPITOR Take 1 tablet (20 mg total) by mouth daily at 6 PM. What changed:  medication strength  how much to take   carvedilol 6.25 MG tablet Commonly known as:  COREG Take 1 tablet (6.25 mg total) by mouth 2 (two) times daily with a meal. What changed:  medication strength  how much to take   escitalopram 20 MG tablet Commonly known as:  LEXAPRO Take 1 tablet (20 mg total) by mouth every morning. What changed:  medication strength  how much to take  when to take this   feeding supplement (NEPRO CARB STEADY) Liqd Take 237 mLs by mouth at bedtime.   feeding supplement (PRO-STAT SUGAR FREE 64) Liqd Take 30 mLs by mouth 3 (three) times daily.   furosemide 20 MG tablet Commonly known as:  LASIX Take 4 tablets (80 mg total) by mouth 2 (two) times  daily. What changed:  how much to take  when to take this   Insulin Glargine 100 UNIT/ML Solostar Pen Commonly known as:  LANTUS Inject 8 Units into the skin daily at 10 pm.   insulin lispro 100 UNIT/ML injection Commonly known as:  HUMALOG Inject 0.03 mLs (3 Units total) into the skin 3 (three) times daily with meals. What changed:  how much to take  when to take this  reasons to take this   iron polysaccharides 150 MG capsule Commonly known as:  NIFEREX Take 1 capsule (150 mg total) by mouth daily. What changed:  when to take this   levothyroxine 175 MCG tablet Commonly known as:  SYNTHROID, LEVOTHROID Take 1 tablet (175 mcg total) by mouth daily before breakfast.   ondansetron 4 MG tablet Commonly known as:  ZOFRAN Take 4 mg by mouth every 8 (eight) hours as needed for nausea or vomiting.   potassium chloride SA 20 MEQ tablet Commonly known as:  K-DUR,KLOR-CON Take 1 tablet (20 mEq total) by mouth daily.  vancomycin 50 mg/mL oral solution Commonly known as:  VANCOCIN Take 2.5 mLs (125 mg total) by mouth 4 (four) times daily. Start taking on:  06/21/2017      Follow-up Information    Cassandra Screws, MD. Schedule an appointment as soon as possible for a visit in 2 week(s).   Specialty:  Family Medicine Why:  Please contact office to schedule a follow up  appointment. Thank you.  Contact information: [address] [place] [address] [phone]        Elvis Coil, MD. Go on 07/16/2017.   Specialty:  Nephrology Why:  2:30 PM Contact information: [address] [place] [address] [phone]          Allergies  Allergen Reactions  . Alprazolam Other (See Comments)    Family preference, for patient to not take med  . Percocet [Oxycodone-Acetaminophen] Other (See Comments)    Family preference, for patient to not take med  . Codeine Diarrhea and Nausea And Vomiting  . Doxycycline Diarrhea and Nausea And Vomiting  . Hydrocodone  Nausea And Vomiting  . Omnicef [Cefdinir] Nausea Only and Other (See Comments)    Constipation, tolerated Zosyn  . Tramadol Other (See Comments)    Pt hallucinates when taking medication. Family request not to give medication to pt   . Augmentin [Amoxicillin-Pot Clavulanate] Hives and Rash    Has patient had a PCN reaction causing immediate rash, facial/tongue/throat swelling, SOB or lightheadedness with hypotension: Yes Has patient had a PCN reaction causing severe rash involving mucus membranes or skin necrosis: Yes Did a PCN reaction that required hospitalization No Did PCN reaction occurring within the last 10 years: Yes If all of the above answers are "NO", then may proceed with Cephalosporin use.  Pt states she has taken penicillin since, and was ok with it...  . Ciprofloxacin Hives    Tolerated LVQ in 12/2016   Current Discharge Medication List    START taking these medications   Details  Amino Acids-Protein Hydrolys (FEEDING SUPPLEMENT, PRO-STAT SUGAR FREE 64,) LIQD Take 30 mLs by mouth 3 (three) times daily. Qty: 900 mL, Refills: 0    Nutritional Supplements (FEEDING SUPPLEMENT, NEPRO CARB STEADY,) LIQD Take 237 mLs by mouth at bedtime. Refills: 0    potassium chloride SA (K-DUR,KLOR-CON) 20 MEQ tablet Take 1 tablet (20 mEq total) by mouth daily. Qty: 1 tablet, Refills: 0    vancomycin (VANCOCIN) 50 mg/mL oral solution Take 2.5 mLs (125 mg total) by mouth 4 (four) times daily. Qty: 80 mL, Refills: 0      CONTINUE these medications which have CHANGED   Details  atorvastatin (LIPITOR) 20 MG tablet Take 1 tablet (20 mg total) by mouth daily at 6 PM.    carvedilol (COREG) 6.25 MG tablet Take 1 tablet (6.25 mg total) by mouth 2 (two) times daily with a meal.    escitalopram (LEXAPRO) 20 MG tablet Take 1 tablet (20 mg total) by mouth every morning.    furosemide (LASIX) 20 MG tablet Take 4 tablets (80 mg total) by mouth 2 (two) times daily. Qty: 90 tablet, Refills: 0     iron polysaccharides (NIFEREX) 150 MG capsule Take 1 capsule (150 mg total) by mouth daily.      CONTINUE these medications which have NOT CHANGED   Details  amLODipine (NORVASC) 5 MG tablet Take 1 tablet (5 mg total) by mouth daily. Qty: 30 tablet, Refills: 0    Insulin Glargine (LANTUS) 100 UNIT/ML Solostar Pen  Inject 8 Units into the skin daily at 10 pm. Qty: 15 mL, Refills: 0    levothyroxine (SYNTHROID, LEVOTHROID) 175 MCG tablet Take 1 tablet (175 mcg total) by mouth daily before breakfast. Qty: 30 tablet, Refills: 0    ondansetron (ZOFRAN) 4 MG tablet Take 4 mg by mouth every 8 (eight) hours as needed for nausea or vomiting.    insulin lispro (HUMALOG) 100 UNIT/ML injection Inject 0.03 mLs (3 Units total) into the skin 3 (three) times daily with meals. Qty: 10 mL, Refills: 11      STOP taking these medications     gabapentin (NEURONTIN) 100 MG capsule      metoCLOPramide (REGLAN) 5 MG tablet         Procedures/Studies: Dg Chest 2 View  Result Date: 06/12/2017 CLINICAL DATA:  Worsening acute on chronic anasarca. Weakness for the past week. EXAM: CHEST  2 VIEW COMPARISON:  05/09/2017. FINDINGS: Normal sized heart. Clear lungs with normal vascularity. Mild increase in size of previously demonstrated small bilateral pleural effusions, with a probable subpulmonic component on the right. Thoracic spine degenerative changes. IMPRESSION: Mild increase in size of small bilateral pleural effusions. Electronically Signed   By: Beckie Salts M.D.   On: 06/12/2017 21:19   Dg Lumbar Spine Complete  Result Date: 06/12/2017 CLINICAL DATA:  Larey Seat today with pain in the low back EXAM: LUMBAR SPINE - COMPLETE 4+ VIEW COMPARISON:  CT abdomen pelvis of 05/15/2017 FINDINGS: The lumbar vertebrae are in normal alignment. Intervertebral disc spaces appear normal with the exception of L1-2 where there is loss of disc space and sclerosis with spurring consistent with degenerative disc disease. No  compression deformity is seen. The SI joints are corticated. IMPRESSION: No acute abnormality.  Degenerative disc disease at L1-2. Electronically Signed   By: Dwyane Dee M.D.   On: 06/12/2017 15:25   Dg Sacrum/coccyx  Result Date: 06/12/2017 CLINICAL DATA:  Larey Seat today with pain in the low back EXAM: SACRUM AND COCCYX - 2+ VIEW COMPARISON:  CT abdomen and pelvis of 05/15/2017 FINDINGS: The sacrococcygeal elements are in normal alignment. No acute fracture is seen. The pelvic rami are intact. The SI joints appear corticated. The sacral foramina appear corticated. IMPRESSION: No acute abnormality. Electronically Signed   By: Dwyane Dee M.D.   On: 06/12/2017 15:23   Ct Head Wo Contrast  Result Date: 06/12/2017 CLINICAL DATA:  Larey Seat this morning, neck stiffness, history hypertension, smoking, bipolar disorder, CHF, COPD, type I diabetes mellitus, stroke, stage III chronic kidney disease, COPD EXAM: CT HEAD WITHOUT CONTRAST CT CERVICAL SPINE WITHOUT CONTRAST TECHNIQUE: Multidetector CT imaging of the head and cervical spine was performed following the standard protocol without intravenous contrast. Multiplanar CT image reconstructions of the cervical spine were also generated. COMPARISON:  CT head 05/09/2017, CT cervical spine 12/11/2016 FINDINGS: CT HEAD FINDINGS Brain: Generalized atrophy. Normal ventricular morphology. No midline shift or mass effect. Small vessel chronic ischemic changes of deep cerebral white matter. Small old lacunar infarct LEFT thalamus. No intracranial hemorrhage, mass lesion, evidence of acute infarction, or extra-axial fluid collection. Vascular: Atherosclerotic calcifications of the internal carotid arteries at the skullbase. Skull: Intact Sinuses/Orbits: Clear Other: N/A CT CERVICAL SPINE FINDINGS Alignment: Minimal anterolisthesis C4-C5. Remaining alignment normal Skull base and vertebrae: Skullbase intact. Prior anterior fusion C5-C6 with incorporated bone plug. Chronic anterior  height loss C7, stable. Vertebral body heights otherwise maintained without fracture or bone destruction. Facet degenerative changes bilaterally. Calcification of the anterior longitudinal ligament. Soft tissues and  spinal canal: Prevertebral soft tissues normal thickness Disc levels: Disc space narrowing especially C6-C7 and C7-T1. Calcification of posterior longitudinal ligament at C2-C3. Upper chest: BILATERAL pleural effusions at the lung apices greater on RIGHT. Other: N/A IMPRESSION: Atrophy with small vessel chronic ischemic changes of deep cerebral white matter. Old lacunar infarct LEFT thalamus. No acute intracranial abnormalities. Degenerative disc and facet disease changes of the cervical spine. Prior anterior fusion C5-C6. Chronic anterior height loss C7 unchanged. No acute cervical spine abnormalities. Electronically Signed   By: Ulyses Southward M.D.   On: 06/12/2017 15:22   Ct Cervical Spine Wo Contrast  Result Date: 06/12/2017 CLINICAL DATA:  Larey Seat this morning, neck stiffness, history hypertension, smoking, bipolar disorder, CHF, COPD, type I diabetes mellitus, stroke, stage III chronic kidney disease, COPD EXAM: CT HEAD WITHOUT CONTRAST CT CERVICAL SPINE WITHOUT CONTRAST TECHNIQUE: Multidetector CT imaging of the head and cervical spine was performed following the standard protocol without intravenous contrast. Multiplanar CT image reconstructions of the cervical spine were also generated. COMPARISON:  CT head 05/09/2017, CT cervical spine 12/11/2016 FINDINGS: CT HEAD FINDINGS Brain: Generalized atrophy. Normal ventricular morphology. No midline shift or mass effect. Small vessel chronic ischemic changes of deep cerebral white matter. Small old lacunar infarct LEFT thalamus. No intracranial hemorrhage, mass lesion, evidence of acute infarction, or extra-axial fluid collection. Vascular: Atherosclerotic calcifications of the internal carotid arteries at the skullbase. Skull: Intact Sinuses/Orbits:  Clear Other: N/A CT CERVICAL SPINE FINDINGS Alignment: Minimal anterolisthesis C4-C5. Remaining alignment normal Skull base and vertebrae: Skullbase intact. Prior anterior fusion C5-C6 with incorporated bone plug. Chronic anterior height loss C7, stable. Vertebral body heights otherwise maintained without fracture or bone destruction. Facet degenerative changes bilaterally. Calcification of the anterior longitudinal ligament. Soft tissues and spinal canal: Prevertebral soft tissues normal thickness Disc levels: Disc space narrowing especially C6-C7 and C7-T1. Calcification of posterior longitudinal ligament at C2-C3. Upper chest: BILATERAL pleural effusions at the lung apices greater on RIGHT. Other: N/A IMPRESSION: Atrophy with small vessel chronic ischemic changes of deep cerebral white matter. Old lacunar infarct LEFT thalamus. No acute intracranial abnormalities. Degenerative disc and facet disease changes of the cervical spine. Prior anterior fusion C5-C6. Chronic anterior height loss C7 unchanged. No acute cervical spine abnormalities. Electronically Signed   By: Ulyses Southward M.D.   On: 06/12/2017 15:22   Dg Abd 2 Views  Result Date: 06/14/2017 CLINICAL DATA:  Suprapubic abdominal pain EXAM: ABDOMEN - 2 VIEW COMPARISON:  None. FINDINGS: Prior cholecystectomy. Gas throughout nondistended large and small bowel. No convincing evidence for obstruction. No free air organomegaly. IMPRESSION: Nonspecific bowel gas pattern without convincing evidence of obstruction. Electronically Signed   By: Charlett Nose M.D.   On: 06/14/2017 16:27     Subjective: Pt says she is feeling better, she is agreeable to SNF placement.   Discharge Exam: Vitals:   06/20/17 0556 06/20/17 0900  BP: (!) 175/71 (!) 167/136  Pulse: 85 86  Resp: 16 18  Temp: 98.2 F (36.8 C) 98 F (36.7 C)   Vitals:   06/19/17 1816 06/19/17 2043 06/20/17 0556 06/20/17 0900  BP: 139/62 (!) 158/56 (!) 175/71 (!) 167/136  Pulse: 74 81 85 86   Resp:  16 16 18   Temp:  98 F (36.7 C) 98.2 F (36.8 C) 98 F (36.7 C)  TempSrc:  Oral Oral Oral  SpO2:  93% 95% 94%  Weight:      Height:       General: Pt is alert, awake, not  in acute distress Cardiovascular: RRR, S1/S2 +, no rubs, no gallops Respiratory: CTA bilaterally, no wheezing, no rhonchi Abdominal: Soft, NT, ND, bowel sounds + Extremities: no cyanosis   The results of significant diagnostics from this hospitalization (including imaging, microbiology, ancillary and laboratory) are listed below for reference.     Microbiology: Recent Results (from the past 240 hour(s))  C difficile quick scan w PCR reflex     Status: Abnormal   Collection Time: 06/15/17  7:02 AM  Result Value Ref Range Status   C Diff antigen POSITIVE (A) NEGATIVE Final   C Diff toxin NEGATIVE NEGATIVE Final   C Diff interpretation Results are indeterminate. See PCR results.  Final  Clostridium Difficile by PCR     Status: Abnormal   Collection Time: 06/15/17  7:02 AM  Result Value Ref Range Status   Toxigenic C Difficile by pcr POSITIVE (A) NEGATIVE Final    Comment: Positive for toxigenic C. difficile with little to no toxin production. Only treat if clinical presentation suggests symptomatic illness.    Labs: BNP (last 3 results)  Recent Labs  05/09/17 1042 05/15/17 0820 06/12/17 1553  BNP 578.6* 330.7* 511.6*   Basic Metabolic Panel:  Recent Labs Lab 06/16/17 0732 06/17/17 0627 06/18/17 0427 06/19/17 0430 06/20/17 0438  NA 137 137 136 137 140  K 4.2 3.5 3.3* 3.3* 3.2*  CL 106 104 103 102 104  CO2 16* 24 25 28 28   GLUCOSE 420* 125* 212* 232* 112*  BUN 65* 66* 63* 65* 68*  CREATININE 3.06* 2.97* 2.97* 3.11* 2.91*  CALCIUM 7.7* 7.8* 7.5* 7.4* 7.5*  PHOS  --  4.7* 5.0* 5.4* 4.9*   Liver Function Tests:  Recent Labs Lab 06/14/17 0847 06/15/17 0419 06/16/17 0732 06/17/17 0627 06/18/17 0427 06/19/17 0430 06/20/17 0438  AST 16 12* 11*  --   --   --   --   ALT 11* 11*  11*  --   --   --   --   ALKPHOS 78 66 73  --   --   --   --   BILITOT 0.4 0.3 0.8  --   --   --   --   PROT 4.6* 4.2* 4.7*  --   --   --   --   ALBUMIN 1.5* 1.3* 1.5* 1.5* 1.4* 1.4* 1.5*   No results for input(s): LIPASE, AMYLASE in the last 168 hours. No results for input(s): AMMONIA in the last 168 hours. CBC:  Recent Labs Lab 06/14/17 0847 06/16/17 0732  WBC 7.8 8.8  HGB 10.8* 10.5*  HCT 33.4* 33.7*  MCV 85.9 87.5  PLT 308 295   Cardiac Enzymes: No results for input(s): CKTOTAL, CKMB, CKMBINDEX, TROPONINI in the last 168 hours. BNP: Invalid input(s): POCBNP CBG:  Recent Labs Lab 06/19/17 0814 06/19/17 1148 06/19/17 1715 06/19/17 2023 06/20/17 0757  GLUCAP 280* 280* 313* 226* 162*   D-Dimer No results for input(s): DDIMER in the last 72 hours. Hgb A1c No results for input(s): HGBA1C in the last 72 hours. Lipid Profile No results for input(s): CHOL, HDL, LDLCALC, TRIG, CHOLHDL, LDLDIRECT in the last 72 hours. Thyroid function studies No results for input(s): TSH, T4TOTAL, T3FREE, THYROIDAB in the last 72 hours.  Invalid input(s): FREET3 Anemia work up  Recent Labs  06/18/17 1022  FERRITIN 119  TIBC 209*  IRON 66   Urinalysis    Component Value Date/Time   COLORURINE YELLOW 06/12/2017 2207   APPEARANCEUR HAZY (A) 06/12/2017 2207  APPEARANCEUR Clear 06/15/2014 1508   LABSPEC 1.012 06/12/2017 2207   LABSPEC 1.010 06/15/2014 1508   PHURINE 5.0 06/12/2017 2207   GLUCOSEU >=500 (A) 06/12/2017 2207   GLUCOSEU Negative 06/15/2014 1508   HGBUR NEGATIVE 06/12/2017 2207   BILIRUBINUR NEGATIVE 06/12/2017 2207   BILIRUBINUR Negative 06/15/2014 1508   KETONESUR 20 (A) 06/12/2017 2207   PROTEINUR >=300 (A) 06/12/2017 2207   UROBILINOGEN 0.2 09/14/2015 1617   NITRITE NEGATIVE 06/12/2017 2207   LEUKOCYTESUR TRACE (A) 06/12/2017 2207   LEUKOCYTESUR Negative 06/15/2014 1508   Sepsis Labs Invalid input(s): PROCALCITONIN,  WBC,   LACTICIDVEN Microbiology Recent Results (from the past 240 hour(s))  C difficile quick scan w PCR reflex     Status: Abnormal   Collection Time: 06/15/17  7:02 AM  Result Value Ref Range Status   C Diff antigen POSITIVE (A) NEGATIVE Final   C Diff toxin NEGATIVE NEGATIVE Final   C Diff interpretation Results are indeterminate. See PCR results.  Final  Clostridium Difficile by PCR     Status: Abnormal   Collection Time: 06/15/17  7:02 AM  Result Value Ref Range Status   Toxigenic C Difficile by pcr POSITIVE (A) NEGATIVE Final    Comment: Positive for toxigenic C. difficile with little to no toxin production. Only treat if clinical presentation suggests symptomatic illness.   Time coordinating discharge: 40 mins  SIGNED:  Standley Dakins, MD  [facility] 06/20/2017, 12:12 PM Pager [phone]  If 7PM-7AM, please contact night-coverage [email or web address] Password [email or web address]

## 2017-06-20 NOTE — Progress Notes (Signed)
Cassandra [name] to be D/C'd Skilled nursing facility per MD order.  Discussed prescriptions and follow up appointments with the patient. Prescriptions given to patient, medication list explained in detail. Pt verbalized understanding.  Allergies as of 06/20/2017      Reactions   Alprazolam Other (See Comments)   Family preference, for patient to not take med   Percocet [oxycodone-acetaminophen] Other (See Comments)   Family preference, for patient to not take med   Codeine Diarrhea, Nausea And Vomiting   Doxycycline Diarrhea, Nausea And Vomiting   Hydrocodone Nausea And Vomiting   Omnicef [cefdinir] Nausea Only, Other (See Comments)   Constipation, tolerated Zosyn   Tramadol Other (See Comments)   Pt hallucinates when taking medication. Family request not to give medication to pt    Augmentin [amoxicillin-pot Clavulanate] Hives, Rash   Has patient had a PCN reaction causing immediate rash, facial/tongue/throat swelling, SOB or lightheadedness with hypotension: Yes Has patient had a PCN reaction causing severe rash involving mucus membranes or skin necrosis: Yes Did a PCN reaction that required hospitalization No Did PCN reaction occurring within the last 10 years: Yes If all of the above answers are "NO", then may proceed with Cephalosporin use. Pt states she has taken penicillin since, and was ok with it...   Ciprofloxacin Hives   Tolerated LVQ in 12/2016      Medication List    STOP taking these medications   gabapentin 100 MG capsule Commonly known as:  NEURONTIN   metoCLOPramide 5 MG tablet Commonly known as:  REGLAN     TAKE these medications   amLODipine 5 MG tablet Commonly known as:  NORVASC Take 1 tablet (5 mg total) by mouth daily.   atorvastatin 20 MG tablet Commonly known as:  LIPITOR Take 1 tablet (20 mg total) by mouth daily at 6 PM. What changed:  medication strength  how much to take   carvedilol 6.25 MG tablet Commonly known as:  COREG Take 1 tablet  (6.25 mg total) by mouth 2 (two) times daily with a meal. What changed:  medication strength  how much to take   escitalopram 20 MG tablet Commonly known as:  LEXAPRO Take 1 tablet (20 mg total) by mouth every morning. What changed:  medication strength  how much to take  when to take this   feeding supplement (NEPRO CARB STEADY) Liqd Take 237 mLs by mouth at bedtime.   feeding supplement (PRO-STAT SUGAR FREE 64) Liqd Take 30 mLs by mouth 3 (three) times daily.   furosemide 20 MG tablet Commonly known as:  LASIX Take 4 tablets (80 mg total) by mouth 2 (two) times daily. What changed:  how much to take  when to take this   Insulin Glargine 100 UNIT/ML Solostar Pen Commonly known as:  LANTUS Inject 8 Units into the skin daily at 10 pm.   insulin lispro 100 UNIT/ML injection Commonly known as:  HUMALOG Inject 0.03 mLs (3 Units total) into the skin 3 (three) times daily with meals. What changed:  how much to take  when to take this  reasons to take this   iron polysaccharides 150 MG capsule Commonly known as:  NIFEREX Take 1 capsule (150 mg total) by mouth daily. What changed:  when to take this   levothyroxine 175 MCG tablet Commonly known as:  SYNTHROID, LEVOTHROID Take 1 tablet (175 mcg total) by mouth daily before breakfast.   ondansetron 4 MG tablet Commonly known as:  ZOFRAN Take 4 mg by  mouth every 8 (eight) hours as needed for nausea or vomiting.   potassium chloride SA 20 MEQ tablet Commonly known as:  K-DUR,KLOR-CON Take 1 tablet (20 mEq total) by mouth daily.   vancomycin 50 mg/mL oral solution Commonly known as:  VANCOCIN Take 2.5 mLs (125 mg total) by mouth 4 (four) times daily. Start taking on:  06/21/2017       Vitals:   06/20/17 1307 06/20/17 1824  BP: (!) 184/74 (!) 174/84  Pulse:  74  Resp:  18  Temp:  98.4 F (36.9 C)    Skin clean, dry and intact without evidence of skin break down, no evidence of skin tears noted. IV  catheter discontinued intact. Site without signs and symptoms of complications. Dressing and pressure applied. Pt denies pain at this time. No complaints noted.  An After Visit Summary was printed and given to the patient. Patient escorted via WC, and D/C home via private auto.  Britt BologneseAnisha Mabe RN, BSN

## 2017-06-21 ENCOUNTER — Emergency Department (HOSPITAL_COMMUNITY): Payer: Medicare Other

## 2017-06-21 ENCOUNTER — Emergency Department (HOSPITAL_COMMUNITY)
Admission: EM | Admit: 2017-06-21 | Discharge: 2017-06-21 | Disposition: A | Discharge status: Skilled Nursing Facility | Payer: Medicare Other | Attending: Emergency Medicine | Admitting: Emergency Medicine

## 2017-06-21 ENCOUNTER — Encounter (HOSPITAL_COMMUNITY): Payer: Self-pay | Admitting: Emergency Medicine

## 2017-06-21 DIAGNOSIS — E039 Hypothyroidism, unspecified: Secondary | ICD-10-CM | POA: Insufficient documentation

## 2017-06-21 DIAGNOSIS — Z794 Long term (current) use of insulin: Secondary | ICD-10-CM | POA: Diagnosis not present

## 2017-06-21 DIAGNOSIS — I160 Hypertensive urgency: Secondary | ICD-10-CM | POA: Insufficient documentation

## 2017-06-21 DIAGNOSIS — I10 Essential (primary) hypertension: Secondary | ICD-10-CM | POA: Insufficient documentation

## 2017-06-21 DIAGNOSIS — R531 Weakness: Secondary | ICD-10-CM | POA: Insufficient documentation

## 2017-06-21 DIAGNOSIS — Z87891 Personal history of nicotine dependence: Secondary | ICD-10-CM | POA: Insufficient documentation

## 2017-06-21 DIAGNOSIS — Z79899 Other long term (current) drug therapy: Secondary | ICD-10-CM | POA: Diagnosis not present

## 2017-06-21 DIAGNOSIS — R251 Tremor, unspecified: Secondary | ICD-10-CM | POA: Insufficient documentation

## 2017-06-21 DIAGNOSIS — R509 Fever, unspecified: Secondary | ICD-10-CM | POA: Diagnosis present

## 2017-06-21 LAB — CBC WITH DIFFERENTIAL/PLATELET
BASOS ABS: 0.1 10*3/uL (ref 0.0–0.1)
BASOS PCT: 1 %
EOS PCT: 3 %
Eosinophils Absolute: 0.2 10*3/uL (ref 0.0–0.7)
HCT: 29.1 % — ABNORMAL LOW (ref 36.0–46.0)
Hemoglobin: 9.3 g/dL — ABNORMAL LOW (ref 12.0–15.0)
Lymphocytes Relative: 23 %
Lymphs Abs: 2 10*3/uL (ref 0.7–4.0)
MCH: 28.2 pg (ref 26.0–34.0)
MCHC: 32 g/dL (ref 30.0–36.0)
MCV: 88.2 fL (ref 78.0–100.0)
MONO ABS: 0.4 10*3/uL (ref 0.1–1.0)
Monocytes Relative: 5 %
NEUTROS ABS: 6.1 10*3/uL (ref 1.7–7.7)
Neutrophils Relative %: 68 %
PLATELETS: 294 10*3/uL (ref 150–400)
RBC: 3.3 MIL/uL — AB (ref 3.87–5.11)
RDW: 14.6 % (ref 11.5–15.5)
WBC: 8.8 10*3/uL (ref 4.0–10.5)

## 2017-06-21 LAB — URINALYSIS, ROUTINE W REFLEX MICROSCOPIC
Bilirubin Urine: NEGATIVE
Hgb urine dipstick: NEGATIVE
Ketones, ur: 5 mg/dL — AB
Leukocytes, UA: NEGATIVE
NITRITE: NEGATIVE
Protein, ur: >=300 mg/dL — AB
SPECIFIC GRAVITY, URINE: 1.01 (ref 1.005–1.030)
pH: 6 (ref 5.0–8.0)

## 2017-06-21 LAB — CBG MONITORING, ED
Glucose-Capillary: 390 mg/dL — ABNORMAL HIGH (ref 65–99)
Glucose-Capillary: 311 mg/dL — ABNORMAL HIGH (ref 65–99)

## 2017-06-21 LAB — COMPREHENSIVE METABOLIC PANEL
ALBUMIN: 1.7 g/dL — AB (ref 3.5–5.0)
ALK PHOS: 68 U/L (ref 38–126)
ALT: 36 U/L (ref 14–54)
ANION GAP: 7 (ref 5–15)
AST: 55 U/L — ABNORMAL HIGH (ref 15–41)
BILIRUBIN TOTAL: 0.4 mg/dL (ref 0.3–1.2)
BUN: 69 mg/dL — AB (ref 6–20)
CALCIUM: 7.4 mg/dL — AB (ref 8.9–10.3)
CO2: 29 mmol/L (ref 22–32)
Chloride: 102 mmol/L (ref 101–111)
Creatinine, Ser: 2.73 mg/dL — ABNORMAL HIGH (ref 0.44–1.00)
GFR calc Af Amer: 19 mL/min — ABNORMAL LOW (ref >60)
GFR, EST NON AFRICAN AMERICAN: 17 mL/min — AB (ref >60)
GLUCOSE: 433 mg/dL — AB (ref 65–99)
Potassium: 4.4 mmol/L (ref 3.5–5.1)
Sodium: 138 mmol/L (ref 135–145)
TOTAL PROTEIN: 4.7 g/dL — AB (ref 6.5–8.1)

## 2017-06-21 LAB — LACTIC ACID, PLASMA: Lactic Acid, Venous: 0.6 mmol/L (ref 0.5–1.9)

## 2017-06-21 LAB — LIPASE, BLOOD: LIPASE: 68 U/L — AB (ref 11–51)

## 2017-06-21 LAB — I-STAT TROPONIN, ED: Troponin i, poc: 0.04 ng/mL (ref 0.00–0.08)

## 2017-06-21 MED ORDER — INSULIN ASPART 100 UNIT/ML ~~LOC~~ SOLN
10.0000 [IU] | Freq: Once | SUBCUTANEOUS | Status: AC
  Administered 2017-06-21: 10 [IU] via SUBCUTANEOUS
  Filled 2017-06-21: qty 1

## 2017-06-21 MED ORDER — INSULIN ASPART 100 UNIT/ML ~~LOC~~ SOLN
5.0000 [IU] | Freq: Once | SUBCUTANEOUS | Status: AC
  Administered 2017-06-21: 5 [IU] via SUBCUTANEOUS
  Filled 2017-06-21: qty 1

## 2017-06-21 MED ORDER — DIPHENHYDRAMINE HCL 25 MG PO TABS: 50.0000 mg | ORAL_TABLET | Freq: Four times a day (QID) | ORAL | 0 refills | Status: AC | PRN

## 2017-06-21 MED ORDER — DIPHENHYDRAMINE HCL 50 MG/ML IJ SOLN
25.0000 mg | Freq: Once | INTRAMUSCULAR | Status: AC
  Administered 2017-06-21: 25 mg via INTRAVENOUS
  Filled 2017-06-21: qty 1

## 2017-06-21 MED ORDER — SODIUM CHLORIDE 0.9 % IV BOLUS (SEPSIS)
500.0000 mL | Freq: Once | INTRAVENOUS | Status: AC
  Administered 2017-06-21: 500 mL via INTRAVENOUS

## 2017-06-21 NOTE — ED Notes (Signed)
Staff at [facility] was called and notified of pt's discharge; pt's discharge instructions and prescription also provided.

## 2017-06-21 NOTE — ED Notes (Signed)
Bed: [id number] Expected date: 06/21/17 Expected time: 2:57 PM Means of arrival: Ambulance Comments: Ear ache, ? Fever, elderly

## 2017-06-21 NOTE — ED Triage Notes (Signed)
Patient here from [facility] with complaints of fever, headache, vomiting x1. Dizziness with standing.

## 2017-06-21 NOTE — ED Notes (Signed)
[facility] was called for pt's transportation back to [facility]. 

## 2017-06-21 NOTE — ED Provider Notes (Signed)
[facility]-EMERGENCY DEPT Provider Note   CSN: [id number] Arrival date & time: 06/21/17  1504     History   Chief Complaint Chief Complaint  Patient presents with  . Fever  . Otalgia    HPI Cassandra [name] is a 71 y.o. female.  HPI   71 year old female nursing home patient with hx of COPD, anasarca 2/2 kidney disease, anemia, BPPV, recurrent falls brought here via EMS from [facility] with multiple complaints. She is complaining of pain to the back and neck, pain to the back for head, left ear pain, generalized weakness, feeling nauseous, having abdominal pain, and having dizziness with room spinning sensation since earlier today.  Patient was recently admitted to the hospital due to generalized weakness. She was found to have evidence of C. Difficile. Pt was discharged yesterday and was started on oral vancomycin as treatment of her C.diff.    She denies vomiting, having runny nose sneezing or coughing, shortness of breath, focal numbness.  Past Medical History:  Diagnosis Date  . Allergic rhinitis   . Anemia   . Anxiety   . Arthritis    "mostly feet, hands" (10/17/2016)  . Benign hypertension with CKD (chronic kidney disease) stage III   . Benign paroxysmal positional vertigo   . Bipolar disorder (HCC)   . Broken finger   . Broken shoulder   . Broken toes   . Cervicalgia   . CHF (congestive heart failure) (HCC) 07/2016  . CKD (chronic kidney disease) stage 3, GFR 30-59 ml/min   . COPD (chronic obstructive pulmonary disease) (HCC)   . Depression   . Elevated liver enzymes Hep B/C neg 2014  . Fall at nursing home 10/15/2016  . GERD (gastroesophageal reflux disease)   . Glaucoma   . High cholesterol   . History of alcohol use   . History of blood transfusion 09/2016   "blood got really really low"  . Hypertension   . Hypothyroidism   . Interstitial cystitis    bladder stretched every 9 months  . Migraines    "pretty much qd" (10/17/2016)  . Pneumonia 12/2016  .  Psoriasis   . Stroke (HCC) 05/16/2016   Left occipital and thalamic, right hippocampal  . Thyroid disease   . Tobacco use   . 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 Vitamin B12 deficiency     Patient Active Problem List   Diagnosis Date Noted  . Acute renal failure (ARF) (HCC) 06/14/2017  . Malnutrition of moderate degree 06/13/2017  . Acute renal failure (HCC) 06/13/2017  . CKD stage 4 due to type 2 diabetes mellitus (HCC) 06/12/2017  . Anasarca associated with disorder of kidney 06/12/2017  . Nephrotic syndrome due to type 2 diabetes mellitus (HCC) 06/12/2017  . Anasarca 05/09/2017  . Nausea vomiting and diarrhea 03/08/2017  . Constipation   . Generalized abdominal pain   . Fecal impaction (HCC) 01/04/2017  . HAP (hospital-acquired pneumonia) 12/23/2016  . HCAP (healthcare-associated pneumonia)   . Diabetes mellitus with complication (HCC)   . Hypoglycemia 12/03/2016  . Palliative care encounter   . Goals of care, counseling/discussion   . Cervical vertebral fracture (HCC) 10/17/2016  . Thoracic vertebral fracture (HCC) 10/17/2016  . Fall at nursing home 10/17/2016  . Common bile duct dilatation 10/17/2016  . Closed fracture of cervical vertebra (HCC)   . Recent Gram-negative bacteremia 10/07/2016  . Hyperkalemia   . Gastroesophageal reflux disease   . DKA, type 1 (HCC)  09/06/2016  . Protein-calorie malnutrition, severe 08/28/2016  . AKI (acute kidney injury) (HCC) 07/19/2016  . Acute on chronic diastolic CHF (congestive heart failure) (HCC) 07/19/2016  . Dehydration   . Cerebral embolism with cerebral infarction 07/13/2016  . Left ventricular diastolic dysfunction, NYHA class 1 07/11/2016  . History of CVA (cerebrovascular accident)   . HLD (hyperlipidemia)   . Anemia of chronic disease   . Physical deconditioning   . Acute on chronic kidney failure (HCC)   . Diabetic ketoacidosis with coma associated with type 1 diabetes mellitus (HCC)   .  Cerebral thrombosis with cerebral infarction 05/15/2016  . Generalized anxiety disorder 05/10/2016  . Diabetic ketoacidosis without coma associated with type 1 diabetes mellitus (HCC) 05/06/2016  . Insulin dependent type 2 diabetes mellitus, uncontrolled (HCC)   . Depression   . CKD (chronic kidney disease) stage 3, GFR 30-59 ml/min   . Uncontrolled Hypothyroidism   . Vitamin B12 deficiency   . Benign paroxysmal positional vertigo   . Anxiety   . Allergic rhinitis   . Glaucoma   . Benign hypertension with CKD (chronic kidney disease) stage III   . Tobacco abuse   . Cervicalgia   . Elevated liver enzymes   . History of alcohol use     Past Surgical History:  Procedure Laterality Date  . ANTERIOR CERVICAL DECOMP/DISCECTOMY FUSION  ~ 2009  . APPENDECTOMY    . BACK SURGERY    . BLADDER SUSPENSION    . CATARACT EXTRACTION W/ INTRAOCULAR LENS  IMPLANT, BILATERAL Bilateral   . DILATION AND CURETTAGE OF UTERUS    . HERNIA REPAIR    . LAPAROSCOPIC CHOLECYSTECTOMY    . TONSILLECTOMY    . TUBAL LIGATION    . VAGINAL HYSTERECTOMY     with oophorectomy    OB History    No data available       Home Medications    Prior to Admission medications   Medication Sig Start Date End Date Taking? Authorizing Provider  Amino Acids-Protein Hydrolys (FEEDING SUPPLEMENT, PRO-STAT SUGAR FREE 64,) LIQD Take 30 mLs by mouth 3 (three) times daily. 06/20/17   Johnson, Clanford L, MD  amLODipine (NORVASC) 5 MG tablet Take 1 tablet (5 mg total) by mouth daily. 05/15/17   Lenox PondsSilva Zapata, Edwin, MD  atorvastatin (LIPITOR) 20 MG tablet Take 1 tablet (20 mg total) by mouth daily at 6 PM. 06/20/17   Laural BenesJohnson, Clanford L, MD  carvedilol (COREG) 6.25 MG tablet Take 1 tablet (6.25 mg total) by mouth 2 (two) times daily with a meal. 06/20/17   Johnson, Clanford L, MD  escitalopram (LEXAPRO) 20 MG tablet Take 1 tablet (20 mg total) by mouth every morning. 06/20/17   Johnson, Clanford L, MD  furosemide (LASIX) 20 MG tablet  Take 4 tablets (80 mg total) by mouth 2 (two) times daily. 06/20/17   Johnson, Clanford L, MD  Insulin Glargine (LANTUS) 100 UNIT/ML Solostar Pen Inject 8 Units into the skin daily at 10 pm. 05/14/17   Randel PiggSilva Zapata, Dorma RussellEdwin, MD  insulin lispro (HUMALOG) 100 UNIT/ML injection Inject 0.03 mLs (3 Units total) into the skin 3 (three) times daily with meals. Patient taking differently: Inject 5-7 Units into the skin 3 (three) times daily as needed for high blood sugar.  03/12/17   Rhetta MuraSamtani, Jai-Gurmukh, MD  iron polysaccharides (NIFEREX) 150 MG capsule Take 1 capsule (150 mg total) by mouth daily. 06/20/17   Johnson, Clanford L, MD  levothyroxine (SYNTHROID, LEVOTHROID) 175 MCG tablet Take  1 tablet (175 mcg total) by mouth daily before breakfast. 01/07/17   Milagros Loll, MD  Nutritional Supplements (FEEDING SUPPLEMENT, NEPRO CARB STEADY,) LIQD Take 237 mLs by mouth at bedtime. 06/20/17   Johnson, Clanford L, MD  ondansetron (ZOFRAN) 4 MG tablet Take 4 mg by mouth every 8 (eight) hours as needed for nausea or vomiting.    [provider]  potassium chloride SA (K-DUR,KLOR-CON) 20 MEQ tablet Take 1 tablet (20 mEq total) by mouth daily. 06/20/17   Johnson, Clanford L, MD  vancomycin (VANCOCIN) 50 mg/mL oral solution Take 2.5 mLs (125 mg total) by mouth 4 (four) times daily. 06/21/17 06/29/17  Cleora Fleet, MD    Family History Family History  Problem Relation Age of Onset  . Alcohol abuse Mother   . Arthritis Mother   . Asthma Mother   . Cancer Mother        colon cancer  . Hypertension Mother   . Migraines Mother   . Stroke Mother   . Lung disease Mother   . COPD Mother   . Diabetes Father   . Hypertension Father   . Heart disease Father   . Heart attack Father   . Heart disease Paternal Grandmother   . Diabetes Paternal Grandmother   . Stroke Paternal Grandmother   . Cancer Paternal Grandmother   . Diabetes Paternal Grandfather     Social History Social History  Substance Use Topics    . Smoking status: Former Smoker    Packs/day: 0.25    Years: 10.00    Types: Cigarettes    Quit date: 12/1923  . Smokeless tobacco: Never Used     Comment: QUIT SMOKING 11/2016  . Alcohol use No     Comment: Hasn't had any alcohol "for 2 yrs" (10/17/2016)     Allergies   Alprazolam; Percocet [oxycodone-acetaminophen]; Codeine; Doxycycline; Hydrocodone; Omnicef [cefdinir]; Tramadol; Augmentin [amoxicillin-pot clavulanate]; and Ciprofloxacin   Review of Systems Review of Systems  All other systems reviewed and are negative.    Physical Exam Updated Vital Signs There were no vitals taken for this visit.  Physical Exam  Constitutional: She is oriented to person, place, and time. She appears well-developed and well-nourished. No distress.  Elderly female, nontoxic in appearance  HENT:  Head: Atraumatic.  Ears: Normal TMs bilaterally, a mild cerumen impaction nose: Normal nares Throat: Uvula is midline no posterior oropharyngeal erythema, no trismus   Eyes: Conjunctivae are normal.  Neck: Neck supple.  Mild tenderness to the posterior neck with normal range of motion and no nuchal rigidity  Cardiovascular: Normal rate and regular rhythm.   Pulmonary/Chest: Effort normal and breath sounds normal.  Poor effort but no wheezes, rales, rhonchi  Abdominal: Soft. She exhibits no distension. There is tenderness (Mild diffuse abdominal tenderness on palpation without guarding or rebound tenderness).  Neurological: She is alert and oriented to person, place, and time.  Diminished strength to all 4 extremities.  Alert to self, place, but mistakenly thought it is 2016 as opposed to 2018.  Skin: Rash (Psoriatic skin changes noted to bilateral lower extremities without signs of infection.) noted.  Psychiatric: She has a normal mood and affect.  Nursing note and vitals reviewed.    ED Treatments / Results  Labs (all labs ordered are listed, but only abnormal results are  displayed) Labs Reviewed  COMPREHENSIVE METABOLIC PANEL - Abnormal; Notable for the following:       Result Value   Glucose, Bld 433 (*)  BUN 69 (*)    Creatinine, Ser 2.73 (*)    Calcium 7.4 (*)    Total Protein 4.7 (*)    Albumin 1.7 (*)    AST 55 (*)    GFR calc non Af Amer 17 (*)    GFR calc Af Amer 19 (*)    All other components within normal limits  CBC WITH DIFFERENTIAL/PLATELET - Abnormal; Notable for the following:    RBC 3.30 (*)    Hemoglobin 9.3 (*)    HCT 29.1 (*)    All other components within normal limits  LIPASE, BLOOD - Abnormal; Notable for the following:    Lipase 68 (*)    All other components within normal limits  LACTIC ACID, PLASMA  LACTIC ACID, PLASMA  URINALYSIS, ROUTINE W REFLEX MICROSCOPIC  I-STAT TROPOININ, ED    EKG  EKG Interpretation  Date/Time:  Saturday June 21 2017 15:59:03 EDT Ventricular Rate:  89 PR Interval:    QRS Duration: 68 QT Interval:  387 QTC Calculation: 471 R Axis:   65 Text Interpretation:  Sinus rhythm Low voltage, extremity leads no acute ischemia. no sig change from previous Confirmed by Arby Barrette [phone]) on 06/21/2017 4:49:27 PM       Radiology Dg Abd Acute W/chest  Result Date: 06/21/2017 CLINICAL DATA:  Fever and vomiting EXAM: DG ABDOMEN ACUTE W/ 1V CHEST COMPARISON:  Chest radiograph June 12, 2017; abdominal radiographs June 14, 2017 FINDINGS: PA chest: There is no appreciable edema or consolidation. Heart size and pulmonary vascularity are normal. No adenopathy. Supine and left lateral decubitus abdomen: There is moderate stool in the colon. There is no bowel dilatation or air-fluid level to suggest bowel obstruction. No free air. There are surgical clips in the right upper quadrant. There are foci of iliac artery atherosclerotic calcification. There also phleboliths in the pelvis. IMPRESSION: No bowel obstruction or free air. Moderate stool throughout colon. 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 No lung  edema or consolidation. Electronically Signed   By: Bretta Bang III M.D.   On: 06/21/2017 16:40    Procedures Procedures (including critical care time)  Medications Ordered in ED Medications  sodium chloride 0.9 % bolus 500 mL (500 mLs Intravenous New Bag/Given 06/21/17 1603)     Initial Impression / Assessment and Plan / ED Course  I have reviewed the triage vital signs and the nursing notes.  Pertinent labs & imaging results that were available during my care of the patient were reviewed by me and considered in my medical decision making (see chart for details).     BP (!) 195/91   Pulse 87   Temp 98.3 F (36.8 C) (Rectal)   Resp 10   SpO2 91%    Final Clinical Impressions(s) / ED Diagnoses   Final diagnoses:  Occasional tremors  General weakness  Hypertensive urgency    New Prescriptions New Prescriptions   DIPHENHYDRAMINE (BENADRYL) 25 MG TABLET    Take 2 tablets (50 mg total) by mouth every 6 (six) hours as needed for itching (tremors).   3:59 PM Patient recently diagnosed with having C. difficile, was discharged from hospital yesterday to nursing facility and now she came in with multiple complaints. Her symptoms is likely suggestive of a continuation's of her current infection. I have low suspicion for meningitis causing her fever and neck pain and headache. She does not have any meningismus. A rectal temp was obtained and she is not febrile. We'll perform screening labs, gentle hydration, and will  determine disposition.  Care discussed with Dr. Donnald Garre.   5:02 PM Elevated CBG of 433, normal anion gap. Will give 5 unit of insulin along with IVF. Evidence of CKD, not new.  Mildly elevated lipase of 68.  Normal WBC.  ECG and trop are within normal limit.  Acute abdominal series without concerning feature.    6:57 PM Patient's daughter is now present at bedside. She voiced her concern of body twitching and facial twitching that patient has been expressing for  the past 1-2 days. She mentioned that this is new and she is concerning for potential stroke as patient has prior strokes. She is requesting for a CT scan of patient's head. A CT scan ordered. Patient does exhibits occasional twitching involving her arms and legs. It appears to be nonfocal. She does have low calcium at 7.4 but this is not new. She is not on any medication that can cause dystonic reaction.  8:03 PM CT head without acute intracranial changes.  I discussed this finding with pt and her daughter.  I encourage pt to f/u with her PCP for further care.  She also have evidence of hypertensive urgency with BP 195/91.  This will need to be closely monitor by her PCP.  Her CBG improves to 311 after she received insulin.  She is stable for discharge.  Return precaution given.  Care discussed with DR. Pfeiffer.  Pt can take benadryl as needed for her occasional tremors.     Fayrene Helper, PA-C 06/21/17 Donovan Kail, MD 06/27/17 [phone]

## 2017-06-21 NOTE — ED Provider Notes (Signed)
Medical screening examination/treatment/procedure(s) were conducted as a shared visit with non-physician practitioner(s) and myself.  I personally evaluated the patient during the encounter.   EKG Interpretation  Date/Time:  Saturday June 21 2017 15:59:03 EDT Ventricular Rate:  89 PR Interval:    QRS Duration: 68 QT Interval:  387 QTC Calculation: 471 R Axis:   65 Text Interpretation:  Sinus rhythm Low voltage, extremity leads no acute ischemia. no sig change from previous Confirmed by Arby BarrettePfeiffer, Mercia Dowe [phone]) on 06/21/2017 4:49:27 PM        Arby BarrettePfeiffer, Rykker Coviello, MD 06/27/17 [id number]

## 2017-06-21 NOTE — Discharge Instructions (Signed)
You have been evaluated for your condition.  Your blood pressure is high, please take your blood pressure medication as prescribed.  Have your doctor recheck your blood pressure in 4 days.  Continue taking Vancomycin as treatment for your C.diff.  Take benadryl as needed for tremors.  Follow up with your doctor for further care.  Return if you have any concerns.

## 2017-06-21 NOTE — ED Notes (Signed)
[facility] here to transport pt back to [facility]. 

## 2017-07-01 ENCOUNTER — Observation Stay (HOSPITAL_COMMUNITY)
Admission: EM | Admit: 2017-07-01 | Discharge: 2017-07-05 | Disposition: A | Discharge status: Skilled Nursing Facility | Payer: Medicare Other | Attending: Internal Medicine | Admitting: Internal Medicine

## 2017-07-01 ENCOUNTER — Encounter (HOSPITAL_COMMUNITY): Payer: Self-pay

## 2017-07-01 DIAGNOSIS — E875 Hyperkalemia: Secondary | ICD-10-CM | POA: Insufficient documentation

## 2017-07-01 DIAGNOSIS — R55 Syncope and collapse: Secondary | ICD-10-CM | POA: Diagnosis not present

## 2017-07-01 DIAGNOSIS — F319 Bipolar disorder, unspecified: Secondary | ICD-10-CM | POA: Insufficient documentation

## 2017-07-01 DIAGNOSIS — Z885 Allergy status to narcotic agent status: Secondary | ICD-10-CM | POA: Insufficient documentation

## 2017-07-01 DIAGNOSIS — T68XXXA Hypothermia, initial encounter: Secondary | ICD-10-CM

## 2017-07-01 DIAGNOSIS — E10649 Type 1 diabetes mellitus with hypoglycemia without coma: Secondary | ICD-10-CM | POA: Diagnosis not present

## 2017-07-01 DIAGNOSIS — E039 Hypothyroidism, unspecified: Secondary | ICD-10-CM | POA: Insufficient documentation

## 2017-07-01 DIAGNOSIS — J449 Chronic obstructive pulmonary disease, unspecified: Secondary | ICD-10-CM | POA: Diagnosis not present

## 2017-07-01 DIAGNOSIS — N049 Nephrotic syndrome with unspecified morphologic changes: Secondary | ICD-10-CM | POA: Diagnosis not present

## 2017-07-01 DIAGNOSIS — Z880 Allergy status to penicillin: Secondary | ICD-10-CM | POA: Diagnosis not present

## 2017-07-01 DIAGNOSIS — Z9114 Patient's other noncompliance with medication regimen: Secondary | ICD-10-CM | POA: Diagnosis not present

## 2017-07-01 DIAGNOSIS — L409 Psoriasis, unspecified: Secondary | ICD-10-CM | POA: Diagnosis not present

## 2017-07-01 DIAGNOSIS — Z66 Do not resuscitate: Secondary | ICD-10-CM | POA: Insufficient documentation

## 2017-07-01 DIAGNOSIS — E1022 Type 1 diabetes mellitus with diabetic chronic kidney disease: Secondary | ICD-10-CM | POA: Insufficient documentation

## 2017-07-01 DIAGNOSIS — Z8673 Personal history of transient ischemic attack (TIA), and cerebral infarction without residual deficits: Secondary | ICD-10-CM | POA: Insufficient documentation

## 2017-07-01 DIAGNOSIS — I130 Hypertensive heart and chronic kidney disease with heart failure and stage 1 through stage 4 chronic kidney disease, or unspecified chronic kidney disease: Principal | ICD-10-CM | POA: Insufficient documentation

## 2017-07-01 DIAGNOSIS — E162 Hypoglycemia, unspecified: Secondary | ICD-10-CM | POA: Diagnosis not present

## 2017-07-01 DIAGNOSIS — A0472 Enterocolitis due to Clostridium difficile, not specified as recurrent: Secondary | ICD-10-CM | POA: Diagnosis not present

## 2017-07-01 DIAGNOSIS — F411 Generalized anxiety disorder: Secondary | ICD-10-CM | POA: Diagnosis not present

## 2017-07-01 DIAGNOSIS — Z794 Long term (current) use of insulin: Secondary | ICD-10-CM

## 2017-07-01 DIAGNOSIS — D631 Anemia in chronic kidney disease: Secondary | ICD-10-CM | POA: Diagnosis not present

## 2017-07-01 DIAGNOSIS — N184 Chronic kidney disease, stage 4 (severe): Secondary | ICD-10-CM | POA: Diagnosis not present

## 2017-07-01 DIAGNOSIS — E860 Dehydration: Secondary | ICD-10-CM | POA: Diagnosis not present

## 2017-07-01 DIAGNOSIS — G9340 Encephalopathy, unspecified: Secondary | ICD-10-CM | POA: Insufficient documentation

## 2017-07-01 DIAGNOSIS — E7800 Pure hypercholesterolemia, unspecified: Secondary | ICD-10-CM | POA: Diagnosis not present

## 2017-07-01 DIAGNOSIS — Z981 Arthrodesis status: Secondary | ICD-10-CM | POA: Insufficient documentation

## 2017-07-01 DIAGNOSIS — Z87891 Personal history of nicotine dependence: Secondary | ICD-10-CM | POA: Insufficient documentation

## 2017-07-01 DIAGNOSIS — Z79899 Other long term (current) drug therapy: Secondary | ICD-10-CM | POA: Insufficient documentation

## 2017-07-01 DIAGNOSIS — D638 Anemia in other chronic diseases classified elsewhere: Secondary | ICD-10-CM | POA: Diagnosis present

## 2017-07-01 DIAGNOSIS — K219 Gastro-esophageal reflux disease without esophagitis: Secondary | ICD-10-CM | POA: Insufficient documentation

## 2017-07-01 DIAGNOSIS — N179 Acute kidney failure, unspecified: Secondary | ICD-10-CM | POA: Diagnosis not present

## 2017-07-01 DIAGNOSIS — E11649 Type 2 diabetes mellitus with hypoglycemia without coma: Secondary | ICD-10-CM

## 2017-07-01 DIAGNOSIS — E1121 Type 2 diabetes mellitus with diabetic nephropathy: Secondary | ICD-10-CM | POA: Diagnosis present

## 2017-07-01 LAB — COMPREHENSIVE METABOLIC PANEL
ALBUMIN: 2 g/dL — AB (ref 3.5–5.0)
ALT: 31 U/L (ref 14–54)
ANION GAP: 8 (ref 5–15)
AST: 30 U/L (ref 15–41)
Alkaline Phosphatase: 87 U/L (ref 38–126)
BILIRUBIN TOTAL: 0.2 mg/dL — AB (ref 0.3–1.2)
BUN: 59 mg/dL — ABNORMAL HIGH (ref 6–20)
CO2: 28 mmol/L (ref 22–32)
Calcium: 8 mg/dL — ABNORMAL LOW (ref 8.9–10.3)
Chloride: 103 mmol/L (ref 101–111)
Creatinine, Ser: 2.9 mg/dL — ABNORMAL HIGH (ref 0.44–1.00)
GFR calc Af Amer: 18 mL/min — ABNORMAL LOW (ref >60)
GFR, EST NON AFRICAN AMERICAN: 15 mL/min — AB (ref >60)
GLUCOSE: 176 mg/dL — AB (ref 65–99)
POTASSIUM: 4.5 mmol/L (ref 3.5–5.1)
Sodium: 139 mmol/L (ref 135–145)
TOTAL PROTEIN: 5.8 g/dL — AB (ref 6.5–8.1)

## 2017-07-01 LAB — CBC WITH DIFFERENTIAL/PLATELET
BASOS ABS: 0 10*3/uL (ref 0.0–0.1)
Basophils Relative: 0 %
Eosinophils Absolute: 0.2 10*3/uL (ref 0.0–0.7)
Eosinophils Relative: 1 %
HEMATOCRIT: 33 % — AB (ref 36.0–46.0)
Hemoglobin: 10.9 g/dL — ABNORMAL LOW (ref 12.0–15.0)
LYMPHS PCT: 7 %
Lymphs Abs: 1.7 10*3/uL (ref 0.7–4.0)
MCH: 27.9 pg (ref 26.0–34.0)
MCHC: 33 g/dL (ref 30.0–36.0)
MCV: 84.4 fL (ref 78.0–100.0)
Monocytes Absolute: 1.2 10*3/uL — ABNORMAL HIGH (ref 0.1–1.0)
Monocytes Relative: 5 %
NEUTROS ABS: 19.6 10*3/uL — AB (ref 1.7–7.7)
NEUTROS PCT: 87 %
Platelets: 339 10*3/uL (ref 150–400)
RBC: 3.91 MIL/uL (ref 3.87–5.11)
RDW: 14.3 % (ref 11.5–15.5)
WBC: 22.7 10*3/uL — AB (ref 4.0–10.5)

## 2017-07-01 LAB — CBG MONITORING, ED
GLUCOSE-CAPILLARY: 184 mg/dL — AB (ref 65–99)
GLUCOSE-CAPILLARY: 148 mg/dL — AB (ref 65–99)
Glucose-Capillary: 233 mg/dL — ABNORMAL HIGH (ref 65–99)

## 2017-07-01 LAB — I-STAT CG4 LACTIC ACID, ED: Lactic Acid, Venous: 1.17 mmol/L (ref 0.5–1.9)

## 2017-07-01 LAB — T4, FREE: FREE T4: 1.02 ng/dL (ref 0.61–1.12)

## 2017-07-01 LAB — TSH: TSH: 29.262 u[IU]/mL — ABNORMAL HIGH (ref 0.350–4.500)

## 2017-07-01 MED ORDER — SODIUM CHLORIDE 0.9 % IV BOLUS (SEPSIS)
1000.0000 mL | Freq: Once | INTRAVENOUS | Status: AC
  Administered 2017-07-01: 1000 mL via INTRAVENOUS

## 2017-07-01 MED ORDER — VANCOMYCIN 50 MG/ML ORAL SOLUTION: 125.0000 mg | ORAL | Status: DC

## 2017-07-01 MED ORDER — DEXTROSE 50 % IV SOLN: 1.0000 | INTRAVENOUS | Status: DC | PRN

## 2017-07-01 MED ORDER — ESCITALOPRAM OXALATE 20 MG PO TABS
20.0000 mg | ORAL_TABLET | Freq: Every day | ORAL | Status: DC
  Administered 2017-07-02 – 2017-07-05 (×4): 20 mg via ORAL
  Filled 2017-07-01 (×4): qty 1

## 2017-07-01 MED ORDER — CARVEDILOL 6.25 MG PO TABS
6.2500 mg | ORAL_TABLET | Freq: Two times a day (BID) | ORAL | Status: DC
  Administered 2017-07-02 – 2017-07-05 (×7): 6.25 mg via ORAL
  Filled 2017-07-01 (×7): qty 1

## 2017-07-01 MED ORDER — VANCOMYCIN 50 MG/ML ORAL SOLUTION
125.0000 mg | Freq: Four times a day (QID) | ORAL | Status: DC
  Administered 2017-07-01 – 2017-07-05 (×14): 125 mg via ORAL
  Filled 2017-07-01 (×15): qty 2.5

## 2017-07-01 MED ORDER — LOSARTAN POTASSIUM 50 MG PO TABS: 50.0000 mg | ORAL_TABLET | Freq: Every day | ORAL | Status: DC

## 2017-07-01 MED ORDER — SODIUM CHLORIDE 0.9 % IV SOLN
INTRAVENOUS | Status: AC
  Administered 2017-07-01: via INTRAVENOUS

## 2017-07-01 MED ORDER — ACETAMINOPHEN 325 MG PO TABS
650.0000 mg | ORAL_TABLET | Freq: Four times a day (QID) | ORAL | Status: DC | PRN
  Administered 2017-07-02: 650 mg via ORAL
  Filled 2017-07-01 (×4): qty 2

## 2017-07-01 MED ORDER — LEVOTHYROXINE SODIUM 100 MCG IV SOLR
87.5000 ug | Freq: Every day | INTRAVENOUS | Status: DC
  Administered 2017-07-01 – 2017-07-02 (×2): 87.5 ug via INTRAVENOUS
  Filled 2017-07-01 (×3): qty 5

## 2017-07-01 MED ORDER — HEPARIN SODIUM (PORCINE) 5000 UNIT/ML IJ SOLN
5000.0000 [IU] | Freq: Three times a day (TID) | INTRAMUSCULAR | Status: DC
  Administered 2017-07-01 – 2017-07-05 (×11): 5000 [IU] via SUBCUTANEOUS
  Filled 2017-07-01 (×12): qty 1

## 2017-07-01 MED ORDER — ACETAMINOPHEN 650 MG RE SUPP: 650.0000 mg | Freq: Four times a day (QID) | RECTAL | Status: DC | PRN

## 2017-07-01 MED ORDER — ATORVASTATIN CALCIUM 20 MG PO TABS
20.0000 mg | ORAL_TABLET | Freq: Every day | ORAL | Status: DC
  Administered 2017-07-02 – 2017-07-04 (×3): 20 mg via ORAL
  Filled 2017-07-01 (×3): qty 1

## 2017-07-01 MED ORDER — VANCOMYCIN 50 MG/ML ORAL SOLUTION: 125.0000 mg | Freq: Every day | ORAL | Status: DC

## 2017-07-01 MED ORDER — NEPRO/CARBSTEADY PO LIQD
237.0000 mL | Freq: Every day | ORAL | Status: DC
  Administered 2017-07-01 – 2017-07-02 (×2): 237 mL via ORAL
  Filled 2017-07-01 (×2): qty 237

## 2017-07-01 MED ORDER — POLYSACCHARIDE IRON COMPLEX 150 MG PO CAPS
150.0000 mg | ORAL_CAPSULE | Freq: Every day | ORAL | Status: DC
  Administered 2017-07-02 – 2017-07-05 (×4): 150 mg via ORAL
  Filled 2017-07-01 (×4): qty 1

## 2017-07-01 MED ORDER — VANCOMYCIN 50 MG/ML ORAL SOLUTION: 125.0000 mg | Freq: Two times a day (BID) | ORAL | Status: DC

## 2017-07-01 MED ORDER — AMLODIPINE BESYLATE 5 MG PO TABS
5.0000 mg | ORAL_TABLET | Freq: Every day | ORAL | Status: DC
  Administered 2017-07-02 – 2017-07-05 (×4): 5 mg via ORAL
  Filled 2017-07-01 (×4): qty 1

## 2017-07-01 NOTE — ED Notes (Signed)
[facility] Expected date:  Expected time:  Means of arrival:  Comments: EMS-SZ

## 2017-07-01 NOTE — ED Provider Notes (Signed)
[facility]-EMERGENCY DEPT Provider Note   CSN: [id number] Arrival date & time: 07/01/17  1419     History   Chief Complaint Chief Complaint  Patient presents with  . Loss of Consciousness  . Hypoglycemia   Triage Note 1438 Patient coming from home with c/o seizure and hypoglycin.  Pt was unresponsive at ems arrival to the home. Shortly after she was able to response to pain only. CBG at ems arrival to the house 31. Pt given 1 mg IM glucagon  And 12.5gm of D 50. Pt also was given 2.5 of versed due to shaking. Recheck CBG 206. Patient was at the [facility] for rehab but her insurance ran out and pt was take home by the spouse last night. Pt was found by ems covered with stool in the chair at home.    HPI Cassandra [name] is a 71 y.o. female.  Remainder of history, ROS, and physical exam limited due to patient's condition (sedation). Additional information was obtained from  EMS.   Level V Caveat.    The history is provided by the patient.   On review of record, pt was  Past Medical History:  Diagnosis Date  . Allergic rhinitis   . Anemia   . Anxiety   . Arthritis    "mostly feet, hands" (10/17/2016)  . Benign hypertension with CKD (chronic kidney disease) stage III   . Benign paroxysmal positional vertigo   . Bipolar disorder (HCC)   . Broken finger   . Broken shoulder   . Broken toes   . Cervicalgia   . CHF (congestive heart failure) (HCC) 07/2016  . CKD (chronic kidney disease) stage 3, GFR 30-59 ml/min   . COPD (chronic obstructive pulmonary disease) (HCC)   . Depression   . Elevated liver enzymes Hep B/C neg 2014  . Fall at nursing home 10/15/2016  . GERD (gastroesophageal reflux disease)   . Glaucoma   . High cholesterol   . History of alcohol use   . History of blood transfusion 09/2016   "blood got really really low"  . Hypertension   . Hypothyroidism   . Interstitial cystitis    bladder stretched every 9 months  . Migraines    "pretty much qd"  (10/17/2016)  . Pneumonia 12/2016  . Psoriasis   . Stroke (HCC) 05/16/2016   Left occipital and thalamic, right hippocampal  . Thyroid disease   . Tobacco use   . 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 Vitamin B12 deficiency     Patient Active Problem List   Diagnosis Date Noted  . Acute renal failure (ARF) (HCC) 06/14/2017  . Malnutrition of moderate degree 06/13/2017  . Acute renal failure (HCC) 06/13/2017  . CKD stage 4 due to type 2 diabetes mellitus (HCC) 06/12/2017  . Anasarca associated with disorder of kidney 06/12/2017  . Nephrotic syndrome due to type 2 diabetes mellitus (HCC) 06/12/2017  . Anasarca 05/09/2017  . Nausea vomiting and diarrhea 03/08/2017  . Constipation   . Generalized abdominal pain   . Fecal impaction (HCC) 01/04/2017  . HAP (hospital-acquired pneumonia) 12/23/2016  . HCAP (healthcare-associated pneumonia)   . Diabetes mellitus with complication (HCC)   . Hypoglycemia 12/03/2016  . Palliative care encounter   . Goals of care, counseling/discussion   . Cervical vertebral fracture (HCC) 10/17/2016  . Thoracic vertebral fracture (HCC) 10/17/2016  . Fall at nursing home 10/17/2016  . Common bile duct dilatation 10/17/2016  . Closed fracture  of cervical vertebra (HCC)   . Recent Gram-negative bacteremia 10/07/2016  . Hyperkalemia   . Gastroesophageal reflux disease   . DKA, type 1 (HCC) 09/06/2016  . Protein-calorie malnutrition, severe 08/28/2016  . AKI (acute kidney injury) (HCC) 07/19/2016  . Acute on chronic diastolic CHF (congestive heart failure) (HCC) 07/19/2016  . Dehydration   . Cerebral embolism with cerebral infarction 07/13/2016  . Left ventricular diastolic dysfunction, NYHA class 1 07/11/2016  . History of CVA (cerebrovascular accident)   . HLD (hyperlipidemia)   . Anemia of chronic disease   . Physical deconditioning   . Acute on chronic kidney failure (HCC)   . Diabetic ketoacidosis with coma associated with  type 1 diabetes mellitus (HCC)   . Cerebral thrombosis with cerebral infarction 05/15/2016  . Generalized anxiety disorder 05/10/2016  . Diabetic ketoacidosis without coma associated with type 1 diabetes mellitus (HCC) 05/06/2016  . Insulin dependent type 2 diabetes mellitus, uncontrolled (HCC)   . Depression   . CKD (chronic kidney disease) stage 3, GFR 30-59 ml/min   . Uncontrolled Hypothyroidism   . Vitamin B12 deficiency   . Benign paroxysmal positional vertigo   . Anxiety   . Allergic rhinitis   . Glaucoma   . Benign hypertension with CKD (chronic kidney disease) stage III   . Tobacco abuse   . Cervicalgia   . Elevated liver enzymes   . History of alcohol use     Past Surgical History:  Procedure Laterality Date  . ANTERIOR CERVICAL DECOMP/DISCECTOMY FUSION  ~ 2009  . APPENDECTOMY    . BACK SURGERY    . BLADDER SUSPENSION    . CATARACT EXTRACTION W/ INTRAOCULAR LENS  IMPLANT, BILATERAL Bilateral   . DILATION AND CURETTAGE OF UTERUS    . HERNIA REPAIR    . LAPAROSCOPIC CHOLECYSTECTOMY    . TONSILLECTOMY    . TUBAL LIGATION    . VAGINAL HYSTERECTOMY     with oophorectomy    OB History    No data available       Home Medications    Prior to Admission medications   Medication Sig Start Date End Date Taking? Authorizing Provider  Amino Acids-Protein Hydrolys (FEEDING SUPPLEMENT, PRO-STAT SUGAR FREE 64,) LIQD Take 30 mLs by mouth 3 (three) times daily. 06/20/17   Johnson, Clanford L, MD  amLODipine (NORVASC) 5 MG tablet Take 1 tablet (5 mg total) by mouth daily. 05/15/17   Lenox Ponds, MD  atorvastatin (LIPITOR) 20 MG tablet Take 1 tablet (20 mg total) by mouth daily at 6 PM. 06/20/17   Laural Benes, Clanford L, MD  carvedilol (COREG) 6.25 MG tablet Take 1 tablet (6.25 mg total) by mouth 2 (two) times daily with a meal. 06/20/17   Johnson, Clanford L, MD  diphenhydrAMINE (BENADRYL) 25 MG tablet Take 2 tablets (50 mg total) by mouth every 6 (six) hours as needed for  itching (tremors). 06/21/17   Fayrene Helper, PA-C  escitalopram (LEXAPRO) 20 MG tablet Take 1 tablet (20 mg total) by mouth every morning. 06/20/17   Johnson, Clanford L, MD  furosemide (LASIX) 20 MG tablet Take 4 tablets (80 mg total) by mouth 2 (two) times daily. 06/20/17   Johnson, Clanford L, MD  insulin aspart (NOVOLOG) 100 UNIT/ML injection Inject 4-12 Units into the skin as directed. Sliding Scale by BG: 0-199=0 units, 200-250=4 units, 251-300=6 units, 301-350=10 units, 351-400=12 units, >401 give 12 units, recheck in 1 hour, if still >401 Call MD    [provider]  Insulin Glargine (LANTUS) 100 UNIT/ML Solostar Pen Inject 8 Units into the skin daily at 10 pm. 05/14/17   Randel Pigg, Dorma Russell, MD  insulin lispro (HUMALOG) 100 UNIT/ML injection Inject 0.03 mLs (3 Units total) into the skin 3 (three) times daily with meals. 03/12/17   Rhetta Mura, MD  iron polysaccharides (NIFEREX) 150 MG capsule Take 1 capsule (150 mg total) by mouth daily. 06/20/17   Cleora Fleet, MD  levothyroxine (SYNTHROID, LEVOTHROID) 175 MCG tablet Take 1 tablet (175 mcg total) by mouth daily before breakfast. 01/07/17   Milagros Loll, MD  Nutritional Supplements (FEEDING SUPPLEMENT, NEPRO CARB STEADY,) LIQD Take 237 mLs by mouth at bedtime. 06/20/17   Johnson, Clanford L, MD  ondansetron (ZOFRAN) 4 MG tablet Take 4 mg by mouth every 8 (eight) hours as needed for nausea or vomiting.    [provider]  potassium chloride SA (K-DUR,KLOR-CON) 20 MEQ tablet Take 1 tablet (20 mEq total) by mouth daily. 06/20/17   Cleora Fleet, MD    Family History Family History  Problem Relation Age of Onset  . Alcohol abuse Mother   . Arthritis Mother   . Asthma Mother   . Cancer Mother        colon cancer  . Hypertension Mother   . Migraines Mother   . Stroke Mother   . Lung disease Mother   . COPD Mother   . Diabetes Father   . Hypertension Father   . Heart disease Father   . Heart attack Father   .  Heart disease Paternal Grandmother   . Diabetes Paternal Grandmother   . Stroke Paternal Grandmother   . Cancer Paternal Grandmother   . Diabetes Paternal Grandfather     Social History Social History  Substance Use Topics  . Smoking status: Former Smoker    Packs/day: 0.25    Years: 10.00    Types: Cigarettes    Quit date: 12/1923  . Smokeless tobacco: Never Used     Comment: QUIT SMOKING 11/2016  . Alcohol use No     Comment: Hasn't had any alcohol "for 2 yrs" (10/17/2016)     Allergies   Alprazolam; Percocet [oxycodone-acetaminophen]; Codeine; Doxycycline; Hydrocodone; Omnicef [cefdinir]; Tramadol; Augmentin [amoxicillin-pot clavulanate]; and Ciprofloxacin   Review of Systems Review of Systems  Unable to perform ROS: Mental status change     Physical Exam Updated Vital Signs BP (!) 190/83 (BP Location: Left Arm)   Pulse 80   Temp 99.1 F (37.3 C) (Oral)   Resp (!) 21   Ht 5\' 2"  (1.575 m)   Wt 46.6 kg (102 lb 11.8 oz)   SpO2 95%   BMI 18.79 kg/m    Physical Exam  Constitutional: She appears well-developed and well-nourished. No distress.  HENT:  Head: Normocephalic and atraumatic.  Nose: Nose normal.  Eyes: Pupils are equal, round, and reactive to light. Conjunctivae and EOM are normal. Right eye exhibits no discharge. Left eye exhibits no discharge. No scleral icterus.  Neck: Normal range of motion. Neck supple.  Cardiovascular: Normal rate and regular rhythm.  Exam reveals no gallop and no friction rub.   No murmur heard. Pulmonary/Chest: Effort normal and breath sounds normal. No stridor. No respiratory distress. She has no rales.  Abdominal: Soft. She exhibits no distension. There is no tenderness.  Musculoskeletal: She exhibits no edema or tenderness.  Neurological: GCS eye subscore is 3. GCS verbal subscore is 5. GCS motor subscore is 6.  Sedated. Will respond to verbal  stimuli and converse, but will fall asleep in the middle of conversation.  Skin:  Skin is warm and dry. No rash noted. She is not diaphoretic. No erythema.  Psychiatric: She has a normal mood and affect.  Vitals reviewed.    ED Treatments / Results  Labs (all labs ordered are listed, but only abnormal results are displayed) Labs Reviewed  COMPREHENSIVE METABOLIC PANEL - Abnormal; Notable for the following:       Result Value   Glucose, Bld 176 (*)    BUN 59 (*)    Creatinine, Ser 2.90 (*)    Calcium 8.0 (*)    Total Protein 5.8 (*)    Albumin 2.0 (*)    Total Bilirubin 0.2 (*)    GFR calc non Af Amer 15 (*)    GFR calc Af Amer 18 (*)    All other components within normal limits  CBC WITH DIFFERENTIAL/PLATELET - Abnormal; Notable for the following:    WBC 22.7 (*)    Hemoglobin 10.9 (*)    HCT 33.0 (*)    Neutro Abs 19.6 (*)    Monocytes Absolute 1.2 (*)    All other components within normal limits  TSH - Abnormal; Notable for the following:    TSH 29.262 (*)    All other components within normal limits  CBG MONITORING, ED - Abnormal; Notable for the following:    Glucose-Capillary 184 (*)    All other components within normal limits  CBG MONITORING, ED - Abnormal; Notable for the following:    Glucose-Capillary 148 (*)    All other components within normal limits  CBG MONITORING, ED - Abnormal; Notable for the following:    Glucose-Capillary 233 (*)    All other components within normal limits  T4, FREE  URINALYSIS, ROUTINE W REFLEX MICROSCOPIC  T3, FREE  CBC  CREATININE, SERUM  COMPREHENSIVE METABOLIC PANEL  CBC WITH DIFFERENTIAL/PLATELET  I-STAT CG4 LACTIC ACID, ED  CBG MONITORING, ED    EKG  EKG Interpretation None       Radiology No results found.  Procedures Procedures (including critical care time)  Medications Ordered in ED Medications  sodium chloride 0.9 % bolus 1,000 mL (not administered)  dextrose 50 % solution 50 mL (not administered)     Initial Impression / Assessment and Plan / ED Course  I have reviewed the  triage vital signs and the nursing notes.  Pertinent labs & imaging results that were available during my care of the patient were reviewed by me and considered in my medical decision making (see chart for details).     Loss of consciousness and possible seizure-like activity in the setting of hypoglycemia with CBG of 30 by EMS. Patient given D50 in addition to Versed. On arrival patient was sedated making history taking limiting.   On arrival patient was noted to be mildly hypothermic which may be secondary to the hypoglycemia. Uncertain of the duration of her hypoglycemia. Patient is on long-term insulin. Patient is currently being treated for C. difficile infection, which may be causing absorption difficulty. Family reports that patient has been eating well.   In addition patient has a history of hypothyroidism, given her infectious process with hypothermia and hypoglycemia, is a concern for possible myxedema. TSH was elevated. Currently waiting on T4/T3.  Labs revealed significant leukocytosis, increased from prior. Renal function at her baseline in.  Given her long-term counseling with hypoglycemia, patient will require admission for continued monitoring. Would also like to admit the  patient for continued workup for possible myxedema, pain management as needed.  Final Clinical Impressions(s) / ED Diagnoses   Final diagnoses:  Hypoglycemia  Hypothermia, initial encounter      Devesh Monforte, Amadeo Garnet, MD 07/02/17 [phone]

## 2017-07-01 NOTE — ED Triage Notes (Addendum)
Patient coming from home with c/o seizure and hypoglycin.  Pt was unresponsive at ems arrival to the home. Shortly after she was able to response to pain only. CBG at ems arrival to the house cbg 31. Pt given 1 mg IM glucagon  And 12.5gm of D 50. Pt also was given 2.5 of versed due to shaking. Recheck CBG 206. Patient was at the [facility] for rehab but her insurance ran out and pt was take home by the spouse last night. Pt was found by ems covered with stool in the chair at home.

## 2017-07-01 NOTE — H&P (Addendum)
History and Physical    Cassandra [name] ZOX:[id number] DOB: 24-Mar-1946 DOA: 07/01/2017  PCP: Henrine Screws, MD  Patient coming from: Home.  History obtained from patient's daughter. Patient appears confused.  Chief Complaint: Loss of consciousness.  HPI: Cassandra [name] is a 71 y.o. 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 As per the daughter patient was discharged to skilled nursing facility 2 weeks ago after being treated for nephrotic syndrome and C. difficile colitis. Patient was discharged from the skilled nursing facility 2 days ago and was taken home. After being at home patient was lying on the couch the whole night. In the morning patient was difficult to be aroused and EMS was called. Patient's blood sugar was found to be in 30s. Patient was given D50 and patient became more alert today. Patient also had constant diarrhea. Patient was lying in a pool of diarrhea when EMS arrived.   ED Course: In the ER patient's blood sugar has improved to 176. TSH was 29.2. Patient had leukocytosis and in the ER patient had multiple episodes of diarrhea. On my exam patient is nonfocal but appears confused. Patient appears dry and dehydrated. Patient admitted for further management of hypoglycemic episodes. Hypothyroidism with significantly elevated TSH and diarrhea.  Review of Systems: As per HPI, rest all negative.   Past Medical History:  Diagnosis Date  . Allergic rhinitis   . Anemia   . Anxiety   . Arthritis    "mostly feet, hands" (10/17/2016)  . Benign hypertension with CKD (chronic kidney disease) stage III   . Benign paroxysmal positional vertigo   . Bipolar disorder (HCC)   . Broken finger   . Broken shoulder   . Broken toes   . Cervicalgia   .  CHF (congestive heart failure) (HCC) 07/2016  . CKD (chronic kidney disease) stage 3, GFR 30-59 ml/min   . COPD (chronic obstructive pulmonary disease) (HCC)   . Depression   . Elevated liver enzymes Hep B/C neg 2014  . Fall at nursing home 10/15/2016  . GERD (gastroesophageal reflux disease)   . Glaucoma   . High cholesterol   . History of alcohol use   . History of blood transfusion 09/2016   "blood got really really low"  . Hypertension   . Hypothyroidism   . Interstitial cystitis    bladder stretched every 9 months  . Migraines    "pretty much qd" (10/17/2016)  . Pneumonia 12/2016  . Psoriasis   . Stroke (HCC) 05/16/2016   Left occipital and thalamic, right hippocampal  . Thyroid disease   . Tobacco use   . 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 Vitamin B12 deficiency     Past Surgical History:  Procedure Laterality Date  . ANTERIOR CERVICAL DECOMP/DISCECTOMY FUSION  ~ 2009  . APPENDECTOMY    . BACK SURGERY    . BLADDER SUSPENSION    . CATARACT EXTRACTION W/ INTRAOCULAR LENS  IMPLANT, BILATERAL Bilateral   . DILATION AND CURETTAGE OF UTERUS    . HERNIA REPAIR    . LAPAROSCOPIC CHOLECYSTECTOMY    . TONSILLECTOMY    . TUBAL LIGATION    . VAGINAL HYSTERECTOMY     with oophorectomy     reports that she quit smoking about  93 years ago. Her smoking use included Cigarettes. She has a 2.50 pack-year smoking history. She has never used smokeless tobacco. She reports that she does not drink alcohol or use drugs.  Allergies  Allergen Reactions  . Alprazolam Other (See Comments)    Family preference, for patient to not take med  . Percocet [Oxycodone-Acetaminophen] Other (See Comments)    Family preference, for patient to not take med  . Codeine Diarrhea and Nausea And Vomiting  . Doxycycline Diarrhea and Nausea And Vomiting  . Hydrocodone Nausea And Vomiting  . Omnicef [Cefdinir] Nausea Only and Other (See Comments)    Constipation, tolerated Zosyn    . Tramadol Other (See Comments)    Pt hallucinates when taking medication. Family request not to give medication to pt   . Augmentin [Amoxicillin-Pot Clavulanate] Hives and Rash    Has patient had a PCN reaction causing immediate rash, facial/tongue/throat swelling, SOB or lightheadedness with hypotension: Yes Has patient had a PCN reaction causing severe rash involving mucus membranes or skin necrosis: Yes Did a PCN reaction that required hospitalization No Did PCN reaction occurring within the last 10 years: Yes If all of the above answers are "NO", then may proceed with Cephalosporin use.  Pt states she has taken penicillin since, and was ok with it...  . Ciprofloxacin Hives    Tolerated LVQ in 12/2016    Family History  Problem Relation Age of Onset  . Alcohol abuse Mother   . Arthritis Mother   . Asthma Mother   . Cancer Mother        colon cancer  . Hypertension Mother   . Migraines Mother   . Stroke Mother   . Lung disease Mother   . COPD Mother   . Diabetes Father   . Hypertension Father   . Heart disease Father   . Heart attack Father   . Heart disease Paternal Grandmother   . Diabetes Paternal Grandmother   . Stroke Paternal Grandmother   . Cancer Paternal Grandmother   . Diabetes Paternal Grandfather     Prior to Admission medications   Medication Sig Start Date End Date Taking? Authorizing Provider  Amino Acids-Protein Hydrolys (FEEDING SUPPLEMENT, PRO-STAT SUGAR FREE 64,) LIQD Take 30 mLs by mouth 3 (three) times daily. 06/20/17  Yes Johnson, Clanford L, MD  amLODipine (NORVASC) 5 MG tablet Take 1 tablet (5 mg total) by mouth daily. 05/15/17  Yes Randel PiggSilva Zapata, Dorma RussellEdwin, MD  atorvastatin (LIPITOR) 20 MG tablet Take 1 tablet (20 mg total) by mouth daily at 6 PM. 06/20/17  Yes Johnson, Clanford L, MD  carvedilol (COREG) 6.25 MG tablet Take 1 tablet (6.25 mg total) by mouth 2 (two) times daily with a meal. 06/20/17  Yes Johnson, Clanford L, MD  diphenhydrAMINE (BENADRYL) 25  MG tablet Take 2 tablets (50 mg total) by mouth every 6 (six) hours as needed for itching (tremors). 06/21/17  Yes Fayrene Helperran, Bowie, PA-C  escitalopram (LEXAPRO) 20 MG tablet Take 1 tablet (20 mg total) by mouth every morning. 06/20/17  Yes Johnson, Clanford L, MD  furosemide (LASIX) 20 MG tablet Take 4 tablets (80 mg total) by mouth 2 (two) times daily. 06/20/17  Yes Johnson, Clanford L, MD  gabapentin (NEURONTIN) 100 MG capsule TK 1 C PO TID 05/14/17  Yes [provider]  hydrochlorothiazide (HYDRODIURIL) 25 MG tablet Take 25 mg by mouth daily.   Yes [provider]  Insulin Glargine (LANTUS) 100 UNIT/ML Solostar Pen Inject 8 Units into the  skin daily at 10 pm. 05/14/17  Yes Randel Pigg, Dorma Russell, MD  insulin lispro (HUMALOG) 100 UNIT/ML injection Inject 0.03 mLs (3 Units total) into the skin 3 (three) times daily with meals. 03/12/17  Yes Rhetta Mura, MD  iron polysaccharides (NIFEREX) 150 MG capsule Take 1 capsule (150 mg total) by mouth daily. 06/20/17  Yes Johnson, Clanford L, MD  levothyroxine (SYNTHROID, LEVOTHROID) 175 MCG tablet Take 1 tablet (175 mcg total) by mouth daily before breakfast. 01/07/17  Yes Sudini, Wardell Heath, MD  losartan (COZAAR) 50 MG tablet Take 50 mg by mouth daily.   Yes [provider]  Nutritional Supplements (FEEDING SUPPLEMENT, NEPRO CARB STEADY,) LIQD Take 237 mLs by mouth at bedtime. 06/20/17  Yes Johnson, Clanford L, MD  pantoprazole (PROTONIX) 40 MG tablet Take 40 mg by mouth daily.   Yes [provider]  potassium chloride SA (K-DUR,KLOR-CON) 20 MEQ tablet Take 1 tablet (20 mEq total) by mouth daily. 06/20/17  Yes Johnson, Clanford L, MD  metoCLOPramide (REGLAN) 5 MG tablet TK 1 T PO TID PRN FOR ABDOMINAL CRAMPING 05/14/17   [provider]  ondansetron (ZOFRAN) 4 MG tablet Take 4 mg by mouth every 8 (eight) hours as needed for nausea or vomiting.    [provider]    Physical Exam: Vitals:   07/01/17 1602 07/01/17 1915  07/01/17 1918 07/01/17 2154  BP:  (!) 156/90 (!) 156/90 (!) 190/83  Pulse: 74 83 81 80  Resp: 13 17 (!) 22 (!) 21  Temp: (!) 96.5 F (35.8 C)   99.1 F (37.3 C)  TempSrc: Rectal   Oral  SpO2: 100% 93% 98% 95%  Weight:    46.6 kg (102 lb 11.8 oz)  Height:    5\' 2"  (1.575 m)      Constitutional: Moderately built and nourished. Vitals:   07/01/17 1602 07/01/17 1915 07/01/17 1918 07/01/17 2154  BP:  (!) 156/90 (!) 156/90 (!) 190/83  Pulse: 74 83 81 80  Resp: 13 17 (!) 22 (!) 21  Temp: (!) 96.5 F (35.8 C)   99.1 F (37.3 C)  TempSrc: Rectal   Oral  SpO2: 100% 93% 98% 95%  Weight:    46.6 kg (102 lb 11.8 oz)  Height:    5\' 2"  (1.575 m)   Eyes: Anicteric no pallor. ENMT: No discharge from the ears eyes nose or mouth. Neck: No mass felt. No JVD appreciated. Respiratory: No rhonchi or crepitations. Cardiovascular: S1-S2 heard no murmurs appreciated. Abdomen: Soft nontender bowel sounds present. Musculoskeletal: No edema. No joint effusion. Skin: Appears dry. Neurologic: Alert awake oriented to name. Appears confused. Moves all extremities. Pupils are equal and reacting to light. Psychiatric: Appears confused.   Labs on Admission: I have personally reviewed following labs and imaging studies  CBC:  Recent Labs Lab 07/01/17 1526  WBC 22.7*  NEUTROABS 19.6*  HGB 10.9*  HCT 33.0*  MCV 84.4  PLT 339   Basic Metabolic Panel:  Recent Labs Lab 07/01/17 1526  NA 139  K 4.5  CL 103  CO2 28  GLUCOSE 176*  BUN 59*  CREATININE 2.90*  CALCIUM 8.0*   GFR: Estimated Creatinine Clearance: 13.5 mL/min (A) (by C-G formula based on SCr of 2.9 mg/dL (H)). Liver Function Tests:  Recent Labs Lab 07/01/17 1526  AST 30  ALT 31  ALKPHOS 87  BILITOT 0.2*  PROT 5.8*  ALBUMIN 2.0*   No results for input(s): LIPASE, AMYLASE in the last 168 hours. No results for input(s):  AMMONIA in the last 168 hours. Coagulation Profile: No results for input(s): INR, PROTIME in the  last 168 hours. Cardiac Enzymes: No results for input(s): CKTOTAL, CKMB, CKMBINDEX, TROPONINI in the last 168 hours. BNP (last 3 results) No results for input(s): PROBNP in the last 8760 hours. HbA1C: No results for input(s): HGBA1C in the last 72 hours. CBG:  Recent Labs Lab 07/01/17 1508 07/01/17 1740 07/01/17 2158  GLUCAP 184* 148* 233*   Lipid Profile: No results for input(s): CHOL, HDL, LDLCALC, TRIG, CHOLHDL, LDLDIRECT in the last 72 hours. Thyroid Function Tests:  Recent Labs  07/01/17 1601  TSH 29.262*  FREET4 1.02   Anemia Panel: No results for input(s): VITAMINB12, FOLATE, FERRITIN, TIBC, IRON, RETICCTPCT in the last 72 hours. Urine analysis:    Component Value Date/Time   COLORURINE YELLOW 06/21/2017 1725   APPEARANCEUR CLEAR 06/21/2017 1725   APPEARANCEUR Clear 06/15/2014 1508   LABSPEC 1.010 06/21/2017 1725   LABSPEC 1.010 06/15/2014 1508   PHURINE 6.0 06/21/2017 1725   GLUCOSEU >=500 (A) 06/21/2017 1725   GLUCOSEU Negative 06/15/2014 1508   HGBUR NEGATIVE 06/21/2017 1725   BILIRUBINUR NEGATIVE 06/21/2017 1725   BILIRUBINUR Negative 06/15/2014 1508   KETONESUR 5 (A) 06/21/2017 1725   PROTEINUR >=300 (A) 06/21/2017 1725   UROBILINOGEN 0.2 09/14/2015 1617   NITRITE NEGATIVE 06/21/2017 1725   LEUKOCYTESUR NEGATIVE 06/21/2017 1725   LEUKOCYTESUR Negative 06/15/2014 1508   Sepsis Labs: @LABRCNTIP (procalcitonin:4,lacticidven:4) )No results found for this or any previous visit (from the past 240 hour(s)).   Radiological Exams on Admission: No results found.   Assessment/Plan Active Problems:   Acute encephalopathy   Anemia of chronic disease   Hypoglycemia   Nephrotic syndrome due to type 2 diabetes mellitus (HCC)   Syncope   C. difficile colitis    1. Syncope likely from hypoglycemia from poor oral intake - patient's daughter states that patient has not been eating well. Appears dry. For now I will keep patient on CBG checks without coverage.  Once blood sugar remains elevated and stable will start on sliding scale initially and follow metabolic panel. Patient does have a history of brittle diabetes with recurrent DKA. 2. Dehydration with history of nephrotic syndrome and chronic kidney disease stage IV- patient appears dehydrated on exam. Would hold off Lasix. For now we will gently hydrate and closely monitor patient's intake output and also physically since patient gets easily into anasarca given history of nephrotic syndrome. 3. C. Difficile - patient has been placed back on vancomycin. 4. Severe hypothyroidism - not sure if patient has been taking her medications. For now I have dose Synthroid as IV. 5. Leukocytosis likely from C. Difficile - follow CBC. 6. Hypertension on Coreg amlodipine Cozaar. Patient is on Cozaar despite kidney disease secondary to nephrotic syndrome. 7. Anemia probably from renal disease - follow CBC. 8. Hyperlipidemia on statins. 9. Acute encephalopathy likely from metabolic reasons. Closely follow clinically. Appears nonfocal. Holding Neurontin for now.  I have reviewed patient's old charts and labs. EKG is pending.   DVT prophylaxis: Heparin. Code Status: Full code.  Family Communication: Patient's daughter.  Disposition Plan: To be determined. Probably will need rehabilitation.  Consults called: Physical therapy and Child psychotherapist.  Admission status: Observation.    Eduard Clos MD [facility] Pager [phone].  If 7PM-7AM, please contact night-coverage [email or web address] Password [email or web address]  07/01/2017, 11:02 PM

## 2017-07-02 DIAGNOSIS — N184 Chronic kidney disease, stage 4 (severe): Secondary | ICD-10-CM | POA: Diagnosis not present

## 2017-07-02 DIAGNOSIS — I130 Hypertensive heart and chronic kidney disease with heart failure and stage 1 through stage 4 chronic kidney disease, or unspecified chronic kidney disease: Secondary | ICD-10-CM | POA: Diagnosis not present

## 2017-07-02 DIAGNOSIS — N179 Acute kidney failure, unspecified: Secondary | ICD-10-CM | POA: Diagnosis not present

## 2017-07-02 DIAGNOSIS — E162 Hypoglycemia, unspecified: Secondary | ICD-10-CM

## 2017-07-02 DIAGNOSIS — D631 Anemia in chronic kidney disease: Secondary | ICD-10-CM | POA: Diagnosis not present

## 2017-07-02 LAB — COMPREHENSIVE METABOLIC PANEL
ALBUMIN: 1.9 g/dL — AB (ref 3.5–5.0)
ALT: 29 U/L (ref 14–54)
ANION GAP: 16 — AB (ref 5–15)
AST: 25 U/L (ref 15–41)
Alkaline Phosphatase: 88 U/L (ref 38–126)
BUN: 61 mg/dL — ABNORMAL HIGH (ref 6–20)
CHLORIDE: 102 mmol/L (ref 101–111)
CO2: 20 mmol/L — AB (ref 22–32)
Calcium: 7.8 mg/dL — ABNORMAL LOW (ref 8.9–10.3)
Creatinine, Ser: 2.9 mg/dL — ABNORMAL HIGH (ref 0.44–1.00)
GFR calc non Af Amer: 15 mL/min — ABNORMAL LOW (ref >60)
GFR, EST AFRICAN AMERICAN: 18 mL/min — AB (ref >60)
GLUCOSE: 530 mg/dL — AB (ref 65–99)
POTASSIUM: 5.3 mmol/L — AB (ref 3.5–5.1)
SODIUM: 138 mmol/L (ref 135–145)
Total Bilirubin: 0.6 mg/dL (ref 0.3–1.2)
Total Protein: 5.1 g/dL — ABNORMAL LOW (ref 6.5–8.1)

## 2017-07-02 LAB — CREATININE, SERUM
CREATININE: 2.86 mg/dL — AB (ref 0.44–1.00)
GFR, EST AFRICAN AMERICAN: 18 mL/min — AB (ref >60)
GFR, EST NON AFRICAN AMERICAN: 16 mL/min — AB (ref >60)

## 2017-07-02 LAB — CBC WITH DIFFERENTIAL/PLATELET
Basophils Absolute: 0 10*3/uL (ref 0.0–0.1)
Basophils Relative: 0 %
Eosinophils Absolute: 0.1 10*3/uL (ref 0.0–0.7)
Eosinophils Relative: 1 %
HEMATOCRIT: 32.3 % — AB (ref 36.0–46.0)
HEMOGLOBIN: 10.5 g/dL — AB (ref 12.0–15.0)
LYMPHS PCT: 16 %
Lymphs Abs: 1.4 10*3/uL (ref 0.7–4.0)
MCH: 27.7 pg (ref 26.0–34.0)
MCHC: 32.5 g/dL (ref 30.0–36.0)
MCV: 85.2 fL (ref 78.0–100.0)
MONO ABS: 0.2 10*3/uL (ref 0.1–1.0)
MONOS PCT: 3 %
NEUTROS ABS: 7.2 10*3/uL (ref 1.7–7.7)
NEUTROS PCT: 80 %
Platelets: 302 10*3/uL (ref 150–400)
RBC: 3.79 MIL/uL — ABNORMAL LOW (ref 3.87–5.11)
RDW: 14.4 % (ref 11.5–15.5)
WBC: 9 10*3/uL (ref 4.0–10.5)

## 2017-07-02 LAB — URINALYSIS, ROUTINE W REFLEX MICROSCOPIC
BILIRUBIN URINE: NEGATIVE
Hgb urine dipstick: NEGATIVE
KETONES UR: 20 mg/dL — AB
LEUKOCYTES UA: NEGATIVE
NITRITE: NEGATIVE
PH: 6 (ref 5.0–8.0)
Protein, ur: >=300 mg/dL — AB
Specific Gravity, Urine: 1.013 (ref 1.005–1.030)

## 2017-07-02 LAB — IRON AND TIBC
IRON: 50 ug/dL (ref 28–170)
SATURATION RATIOS: 24 % (ref 10.4–31.8)
TIBC: 204 ug/dL — AB (ref 250–450)
UIBC: 154 ug/dL

## 2017-07-02 LAB — BASIC METABOLIC PANEL
Anion gap: 10 (ref 5–15)
BUN: 58 mg/dL — ABNORMAL HIGH (ref 6–20)
CHLORIDE: 104 mmol/L (ref 101–111)
CO2: 25 mmol/L (ref 22–32)
CREATININE: 2.97 mg/dL — AB (ref 0.44–1.00)
Calcium: 7.7 mg/dL — ABNORMAL LOW (ref 8.9–10.3)
GFR calc non Af Amer: 15 mL/min — ABNORMAL LOW (ref >60)
GFR, EST AFRICAN AMERICAN: 17 mL/min — AB (ref >60)
Glucose, Bld: 211 mg/dL — ABNORMAL HIGH (ref 65–99)
Potassium: 4.2 mmol/L (ref 3.5–5.1)
SODIUM: 139 mmol/L (ref 135–145)

## 2017-07-02 LAB — RETICULOCYTES
RBC.: 3.08 MIL/uL — AB (ref 3.87–5.11)
Retic Count, Absolute: 40 10*3/uL (ref 19.0–186.0)
Retic Ct Pct: 1.3 % (ref 0.4–3.1)

## 2017-07-02 LAB — CBC
HCT: 32.4 % — ABNORMAL LOW (ref 36.0–46.0)
HEMOGLOBIN: 10.4 g/dL — AB (ref 12.0–15.0)
MCH: 26.9 pg (ref 26.0–34.0)
MCHC: 32.1 g/dL (ref 30.0–36.0)
MCV: 83.7 fL (ref 78.0–100.0)
Platelets: 307 10*3/uL (ref 150–400)
RBC: 3.87 MIL/uL (ref 3.87–5.11)
RDW: 14.3 % (ref 11.5–15.5)
WBC: 10.3 10*3/uL (ref 4.0–10.5)

## 2017-07-02 LAB — GLUCOSE, CAPILLARY
GLUCOSE-CAPILLARY: 138 mg/dL — AB (ref 65–99)
GLUCOSE-CAPILLARY: 164 mg/dL — AB (ref 65–99)
GLUCOSE-CAPILLARY: 297 mg/dL — AB (ref 65–99)
GLUCOSE-CAPILLARY: 361 mg/dL — AB (ref 65–99)
Glucose-Capillary: 481 mg/dL — ABNORMAL HIGH (ref 65–99)
Glucose-Capillary: 448 mg/dL — ABNORMAL HIGH (ref 65–99)
Glucose-Capillary: 183 mg/dL — ABNORMAL HIGH (ref 65–99)
Glucose-Capillary: 47 mg/dL — ABNORMAL LOW (ref 65–99)

## 2017-07-02 LAB — FOLATE: Folate: 12.1 ng/mL (ref >5.9)

## 2017-07-02 LAB — FERRITIN: Ferritin: 133 ng/mL (ref 11–307)

## 2017-07-02 LAB — VITAMIN B12: VITAMIN B 12: 343 pg/mL (ref 180–914)

## 2017-07-02 MED ORDER — INSULIN ASPART 100 UNIT/ML ~~LOC~~ SOLN
2.0000 [IU] | Freq: Three times a day (TID) | SUBCUTANEOUS | Status: DC
  Administered 2017-07-03 – 2017-07-05 (×7): 2 [IU] via SUBCUTANEOUS

## 2017-07-02 MED ORDER — INSULIN ASPART 100 UNIT/ML ~~LOC~~ SOLN
0.0000 [IU] | Freq: Three times a day (TID) | SUBCUTANEOUS | Status: DC
  Administered 2017-07-03: 4 [IU] via SUBCUTANEOUS
  Administered 2017-07-03 – 2017-07-04 (×2): 5 [IU] via SUBCUTANEOUS

## 2017-07-02 MED ORDER — INSULIN ASPART 100 UNIT/ML ~~LOC~~ SOLN
0.0000 [IU] | SUBCUTANEOUS | Status: DC
  Administered 2017-07-02 (×2): 9 [IU] via SUBCUTANEOUS
  Administered 2017-07-02: 1 [IU] via SUBCUTANEOUS

## 2017-07-02 MED ORDER — INSULIN GLARGINE 100 UNIT/ML ~~LOC~~ SOLN
5.0000 [IU] | Freq: Every day | SUBCUTANEOUS | Status: DC
  Administered 2017-07-02 – 2017-07-04 (×3): 5 [IU] via SUBCUTANEOUS
  Filled 2017-07-02 (×3): qty 0.05

## 2017-07-02 MED ORDER — INSULIN ASPART 100 UNIT/ML ~~LOC~~ SOLN: 0.0000 [IU] | Freq: Three times a day (TID) | SUBCUTANEOUS | Status: DC

## 2017-07-02 MED ORDER — INSULIN ASPART 100 UNIT/ML ~~LOC~~ SOLN
8.0000 [IU] | Freq: Once | SUBCUTANEOUS | Status: AC
  Administered 2017-07-02: 8 [IU] via SUBCUTANEOUS

## 2017-07-02 MED ORDER — DEXTROSE 50 % IV SOLN
50.0000 mL | Freq: Once | INTRAVENOUS | Status: AC
  Administered 2017-07-02: 50 mL via INTRAVENOUS

## 2017-07-02 MED ORDER — DEXTROSE 50 % IV SOLN
INTRAVENOUS | Status: AC
  Administered 2017-07-02: 50 mL via INTRAVENOUS
  Filled 2017-07-02: qty 50

## 2017-07-02 NOTE — Care Management Note (Deleted)
Case Management Note  Patient Details  Name: Cassandra [name] MRN: [id number] Date of Birth: 06/12/1946  Subjective/Objective: 71 y/o f admitted w/syncope. From home w/elderly spouse.  Spoke to [employer] Corporationdtr Michelle on phone c#[phone]-primary contact-states [facility] states patient unable to care for herself @ home. Informed dtr that we are waiting on PT recc. CSW notified & following also.                   Action/Plan:d/c plan SNF.   Expected Discharge Date:   (unknown)               Expected Discharge Plan:  Skilled Nursing Facility  In-House Referral:  Clinical Social Work  Discharge planning Services  CM Consult  Post Acute Care Choice:    Choice offered to:     DME Arranged:    DME Agency:     HH Arranged:    HH Agency:     Status of Service:  In process, will continue to follow  If discussed at Long Length of Stay Meetings, dates discussed:    Additional Comments:  Lanier ClamMahabir, Gadiel John, RN 07/02/2017, 12:49 PM

## 2017-07-02 NOTE — Consult Note (Signed)
WOC Nurse wound consult note Reason for Consult:IAD and rash Wound type: IAD, freq incont of stool Pressure Injury POA: NA Measurement: 10cm circular perineal area around rectum Wound bed:na Drainage (amount, consistency, odor) na Periwound: intact Dressing procedure/placement/frequency: I have provided nurses with orders for Cleansing and using our peri products including purple top criticaid cream. Pt is already on specialty mattress. Rashes are out of the scope of practice of CWOCN's Please refer pt to a dermatologist at discharge if you agree. We will not follow, but will remain available to this patient, to nursing, and the medical and/or surgical teams.  Please re-consult if we need to assist further.   Barnett HatterMelinda Khari Mally, RN-C, WTA-C, [facility] Wound Treatment Associate

## 2017-07-02 NOTE — Progress Notes (Signed)
Follow up CBG 448. On call MD Toniann FailKakrakandy made aware. STAT EKG obtained. One time dose of 8 units of Novolog and 5 units of Lantus administered per MD verbal order. Will continue to monitor.

## 2017-07-02 NOTE — Progress Notes (Signed)
CRITICAL VALUE ALERT  Critical Value: glucose 530  Date & Time Notied:  07/02/17 0540  Provider Notified: Toniann FailKakrakandy  Orders Received/Actions taken: provider notified prior to lab draw about CBG of 481, new orders received and followed. Will continue to monitor.
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
## 2017-07-02 NOTE — Progress Notes (Signed)
Pt's CBG 481. On call MD made aware. New orders placed. Will continue to monitor.

## 2017-07-02 NOTE — Progress Notes (Signed)
Resumed care of pt. Agree with previous RN's assessment. Will continue to monitor and follow plan of care.  

## 2017-07-02 NOTE — Evaluation (Signed)
Physical Therapy Evaluation Patient Details Name: Cassandra [name] MRN: [id number] DOB: 1946/10/02 Today's Date: 07/02/2017   History of Present Illness  71 yo female admitted with hypoglycemia, syncope. Hx of DM, CKD, falls, CHF, anemia, COPD, depression, CVA. Recent d/c 7/6    Clinical Impression  Limited/bed level eval only on today. Pt appears drowsy. She was not willing to participate with therapy beyond rolling in bed. Pt declined OOB activity. No family present during session. Will continue to follow and progress activity as able. At this time, recommendation is for ST SNF if pt will agree.     Follow Up Recommendations SNF    Equipment Recommendations  None recommended by PT    Recommendations for Other Services       Precautions / Restrictions Precautions Precautions: Fall Restrictions Weight Bearing Restrictions: No      Mobility  Bed Mobility Overal bed mobility: Needs Assistance Bed Mobility: Rolling Rolling: Min assist         General bed mobility comments: Assist to roll x 1.   Transfers                 General transfer comment: NT-pt declined to participate  Ambulation/Gait                Stairs            Wheelchair Mobility    Modified Rankin (Stroke Patients Only)       Balance                                             Pertinent Vitals/Pain Faces Pain Scale: No hurt    Home Living Family/patient expects to be discharged to:: Unsure Living Arrangements: Spouse/significant other Available Help at Discharge: Family Type of Home: Apartment Home Access: Level entry     Home Layout: One level Home Equipment: Wheelchair - [employer] Corporationmanual;Walker - 2 wheels;Shower seat      Prior Function Level of Independence: Needs assistance   Gait / Transfers Assistance Needed: uses RW     Comments: no family present.  pt not really answering questions. history taken from prior admission     Hand Dominance         Extremity/Trunk Assessment   Upper Extremity Assessment Upper Extremity Assessment: Generalized weakness    Lower Extremity Assessment Lower Extremity Assessment: Generalized weakness    Cervical / Trunk Assessment Cervical / Trunk Assessment: Kyphotic  Communication   Communication: No difficulties  Cognition     Overall Cognitive Status: Difficult to assess                                        General Comments      Exercises     Assessment/Plan    PT Assessment Patient needs continued PT services  PT Problem List Decreased strength;Decreased mobility;Decreased activity tolerance;Decreased balance;Decreased knowledge of use of DME       PT Treatment Interventions DME instruction;Gait training;Therapeutic activities;Therapeutic exercise;Patient/family education;Functional mobility training;Balance training    PT Goals (Current goals can be found in the Care Plan section)  Acute Rehab PT Goals Patient Stated Goal: none stated.  PT Goal Formulation: Patient unable to participate in goal setting Time For Goal Achievement: 07/16/17 Potential to Achieve Goals: Fair  Frequency Min 2X/week   Barriers to discharge Decreased caregiver support      Co-evaluation               AM-PAC PT "6 Clicks" Daily Activity  Outcome Measure Difficulty turning over in bed (including adjusting bedclothes, sheets and blankets)?: Total Difficulty moving from lying on back to sitting on the side of the bed? : Total Difficulty sitting down on and standing up from a chair with arms (e.g., wheelchair, bedside commode, etc,.)?: Total Help needed moving to and from a bed to chair (including a wheelchair)?: A Little Help needed walking in hospital room?: A Lot Help needed climbing 3-5 steps with a railing? : A Lot 6 Click Score: 10    End of Session   Activity Tolerance: Patient tolerated treatment well Patient left: in bed;with call bell/phone within  reach;with bed alarm set   PT Visit Diagnosis: Muscle weakness (generalized) (M62.81);Difficulty in walking, not elsewhere classified (R26.2)    Time: 1610-9604 PT Time Calculation (min) (ACUTE ONLY): 8 min   Charges:   PT Evaluation $PT Eval Moderate Complexity: 1 Procedure     PT G Codes:   PT G-Codes **NOT FOR INPATIENT CLASS** Functional Assessment Tool Used: AM-PAC 6 Clicks Basic Mobility;Clinical judgement Functional Limitation: Mobility: Walking and moving around Mobility: Walking and Moving Around Current Status (V4098): At least 20 percent but less than 40 percent impaired, limited or restricted Mobility: Walking and Moving Around Goal Status [id number]): At least 1 percent but less than 20 percent impaired, limited or restricted      Rebeca Alert, MPT Pager: [phone]

## 2017-07-02 NOTE — Progress Notes (Signed)
Inpatient Diabetes Program Recommendations  AACE/ADA: New Consensus Statement on Inpatient Glycemic Control (2015)  Target Ranges:  Prepandial:   less than 140 mg/dL      Peak postprandial:   less than 180 mg/dL (1-2 hours)      Critically ill patients:  140 - 180 mg/dL   Lab Results  Component Value Date   GLUCAP 138 (H) 07/02/2017   HGBA1C 8.7 (H) 05/09/2017    Review of Glycemic Control  Diabetes history: DM- "Brittle" according to MD notes Outpatient Diabetes medications: Lantus 8 units q HS, Humalog 5-7 units tid with meals Current orders for Inpatient glycemic control:  Novolog 0-9 units Q4H, Lantus 5 units daily  Inpatient Diabetes Program Recommendations:   Agree with restart of basal insulin. Consider reducing Novolog correction to custom scale.  151-200 mg/dL- 1 unit, [phone] mg/dL- 2 units, [phone] mg/dL- 3 units, [phone] mg/dL-4 units, [phone] mg/dL-5 units.  Also consider adding Novolog meal coverage 2 units tid with meals (hold if patient eats less than 50%).  Unsure if Jearld Leschradjenta will be helpful due to hx. of "insulin" dependent DM and past DKA admissions?   Will continue to follow.  Thank you. Cassandra [name], RD, LDN, CDE Inpatient Diabetes Coordinator [phone]

## 2017-07-03 DIAGNOSIS — E1121 Type 2 diabetes mellitus with diabetic nephropathy: Secondary | ICD-10-CM | POA: Diagnosis not present

## 2017-07-03 DIAGNOSIS — Z515 Encounter for palliative care: Secondary | ICD-10-CM

## 2017-07-03 DIAGNOSIS — A0472 Enterocolitis due to Clostridium difficile, not specified as recurrent: Secondary | ICD-10-CM | POA: Diagnosis not present

## 2017-07-03 DIAGNOSIS — E162 Hypoglycemia, unspecified: Secondary | ICD-10-CM | POA: Diagnosis not present

## 2017-07-03 DIAGNOSIS — Z7189 Other specified counseling: Secondary | ICD-10-CM

## 2017-07-03 LAB — BASIC METABOLIC PANEL
Anion gap: 9 (ref 5–15)
BUN: 61 mg/dL — AB (ref 6–20)
CO2: 24 mmol/L (ref 22–32)
Calcium: 7.4 mg/dL — ABNORMAL LOW (ref 8.9–10.3)
Chloride: 101 mmol/L (ref 101–111)
Creatinine, Ser: 3.02 mg/dL — ABNORMAL HIGH (ref 0.44–1.00)
GFR calc Af Amer: 17 mL/min — ABNORMAL LOW (ref >60)
GFR, EST NON AFRICAN AMERICAN: 15 mL/min — AB (ref >60)
Glucose, Bld: 355 mg/dL — ABNORMAL HIGH (ref 65–99)
Potassium: 4.8 mmol/L (ref 3.5–5.1)
SODIUM: 134 mmol/L — AB (ref 135–145)

## 2017-07-03 LAB — GLUCOSE, CAPILLARY
GLUCOSE-CAPILLARY: 388 mg/dL — AB (ref 65–99)
GLUCOSE-CAPILLARY: 346 mg/dL — AB (ref 65–99)
GLUCOSE-CAPILLARY: 132 mg/dL — AB (ref 65–99)
GLUCOSE-CAPILLARY: 150 mg/dL — AB (ref 65–99)
Glucose-Capillary: 301 mg/dL — ABNORMAL HIGH (ref 65–99)

## 2017-07-03 LAB — CBC WITH DIFFERENTIAL/PLATELET
Basophils Absolute: 0 10*3/uL (ref 0.0–0.1)
Basophils Relative: 1 %
EOS ABS: 0.2 10*3/uL (ref 0.0–0.7)
EOS PCT: 2 %
HCT: 28.9 % — ABNORMAL LOW (ref 36.0–46.0)
Hemoglobin: 9.6 g/dL — ABNORMAL LOW (ref 12.0–15.0)
LYMPHS ABS: 2.4 10*3/uL (ref 0.7–4.0)
Lymphocytes Relative: 27 %
MCH: 28.4 pg (ref 26.0–34.0)
MCHC: 33.2 g/dL (ref 30.0–36.0)
MCV: 85.5 fL (ref 78.0–100.0)
MONOS PCT: 5 %
Monocytes Absolute: 0.4 10*3/uL (ref 0.1–1.0)
Neutro Abs: 5.8 10*3/uL (ref 1.7–7.7)
Neutrophils Relative %: 65 %
PLATELETS: 314 10*3/uL (ref 150–400)
RBC: 3.38 MIL/uL — ABNORMAL LOW (ref 3.87–5.11)
RDW: 14.6 % (ref 11.5–15.5)
WBC: 8.8 10*3/uL (ref 4.0–10.5)

## 2017-07-03 LAB — T3, FREE: T3, Free: 1.2 pg/mL — ABNORMAL LOW (ref 2.0–4.4)

## 2017-07-03 MED ORDER — HYDROCHLOROTHIAZIDE 25 MG PO TABS
25.0000 mg | ORAL_TABLET | Freq: Every day | ORAL | Status: DC
  Administered 2017-07-03 – 2017-07-05 (×3): 25 mg via ORAL
  Filled 2017-07-03 (×3): qty 1

## 2017-07-03 MED ORDER — LEVOTHYROXINE SODIUM 75 MCG PO TABS
175.0000 ug | ORAL_TABLET | Freq: Every day | ORAL | Status: DC
  Administered 2017-07-03 – 2017-07-05 (×3): 175 ug via ORAL
  Filled 2017-07-03 (×3): qty 1

## 2017-07-03 MED ORDER — NEPRO/CARBSTEADY PO LIQD
237.0000 mL | Freq: Two times a day (BID) | ORAL | Status: DC
  Administered 2017-07-03 – 2017-07-05 (×3): 237 mL via ORAL
  Filled 2017-07-03 (×5): qty 237
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
## 2017-07-03 NOTE — Care Management Note (Signed)
Case Management Note  Patient Details  Name: Cassandra [name] MRN: [id number] Date of Birth: 06-May-1946  Subjective/Objective:  PT recc SNF-CSW following.                  Action/Plan:d/c SNF.   Expected Discharge Date:   (unknown)               Expected Discharge Plan:  Skilled Nursing Facility  In-House Referral:  Clinical Social Work  Discharge planning Services  CM Consult  Post Acute Care Choice:    Choice offered to:     DME Arranged:    DME Agency:     HH Arranged:    HH Agency:     Status of Service:  In process, will continue to follow  If discussed at Long Length of Stay Meetings, dates discussed:    Additional Comments:  Lanier ClamMahabir, Tadarrius Burch, RN 07/03/2017, 9:52 AM

## 2017-07-03 NOTE — Progress Notes (Deleted)
Spoke to dtr [place] #[phone]-[facility]patient signed herself out AMA. Noted PT recc SNF-CSW already following.
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
## 2017-07-03 NOTE — Progress Notes (Signed)
Report received from Knute NeuJ. Scotton, RN. No change since pm assessment. Will continue to monitor and follow the POC.

## 2017-07-03 NOTE — Consult Note (Signed)
Consultation Note Date: 07/03/2017   Patient Name: Cassandra [name]  DOB: 11/21/1946  MRN: [id number]  Age / Sex: 71 y.o., female  PCP: Aura Dials, MD Referring Physician: Baird Lyons*  Reason for Consultation: Establishing goals of care  HPI/Patient Profile: 71 y.o. female  with past medical history of DM type 2, recurrent DKA, nephrotic syndrome, CKD IV, HTN, HLP, anemia admitted on 07/01/2017 with hypoglycemia, c-diff colitis, and CKD IV.  She was recently admitted to the hospital at the end of last month and had been transitioned to SNF.  She is reported to have been discharged from SNF due to insurance and was brought to the hospital later that day after syncopal episode.  Palliative consulted for goals of care.   Clinical Assessment and Goals of Care: I met today with Cassandra [name] in conjunction with Cassandra Gowda, NP from palliative team.  Cassandra [name] reports that the most important things to her are her family and being back at her own home.  She lives with her husband of 14 years and readily admits that she is concerned she cannot get the care she needs at home.  She has 2 daughters as well.  She was recently at [facility] and reports she had a good experience but her insurance stopped paying and she went home.  She thinks that her husband does not understand how sick she is, and she reports he gets "angry pretty easy."  She is agreeable to returning to SNF if it can be arranged.    When asked who helps her make medical decisions, she stated, "I don't need another power of attorney."  When asked to clarify this statement, she began speaking about financial POA responsibilities.  I explained that my question is not about taking away her right to make decisions for herself, but to determine who would be the best person to act as a surrogate if she cannot speak for herself in regard to  medical decisions.  She then reported that her daughter, Cassandra [name], would be the best person to act in this role.  Cassandra [name] is listed as HCPOA in chart as well.  She reports that it may be helpful to her to have a meeting with her daughters present in order to determine care plan moving forward.  She stated that her husband will "have to be in the know," but believes it will be more helpful to have meeting with just her daughters to discuss first.  I then called and discussed with her daughter, Cassandra [name], to set up family meeting.  Cassandra [name] reports that she is concerned that her mother is not getting the care she needs at home and is concerned about her mother's husband being able/willing to care for her appropriately.  She had even arranged for her mother to live long-term in a facility in [place] (had completed paperwork for separation from husband and was arranging Medicaid).  Her mother's husband then was concerned with the financial loss he would have with this and talked Cassandra [name]  into returning to their home.  Cassandra [name] is struggling with how to help at this point. She was named as durable POA at one point, but this has been revoked.  Cassandra [name] reports that she does not see an easy way forward for her mother to get the care she needs as her mother is co-dependent on her husband despite bing in a bad situation.   SUMMARY OF RECOMMENDATIONS   - Patient has named her daughter as HCPOA.  This document is on chart.  Her daughter is not her durable POA. - Patient is agreeable to transition to SNF on discharge if this can be arranged. - Plan for follow-up family meeting with daughters tomorrow at 1:30PM.  Will then involve husband after this meeting per patient preference.  Code Status/Advance Care Planning:  DNR  Palliative Prophylaxis:   Frequent Pain Assessment  Psycho-social/Spiritual:   Desire for further Chaplaincy support:no  Additional Recommendations: Caregiving   Support/Resources  Prognosis:   Unable to determine  Discharge Planning: [place] for rehab with Palliative care service follow-up      Primary Diagnoses: Present on Admission: . Syncope . Nephrotic syndrome due to type 2 diabetes mellitus ([place]) . Anemia of chronic disease . Hypoglycemia . C. difficile colitis . Acute encephalopathy   I have reviewed the medical record, interviewed the patient and family, and examined the patient. The following aspects are pertinent.  Past Medical History:  Diagnosis Date  . Allergic rhinitis   . Anemia   . Anxiety   . Arthritis    "mostly feet, hands" (10/17/2016)  . Benign hypertension with CKD (chronic kidney disease) stage III   . Benign paroxysmal positional vertigo   . Bipolar disorder ([place])   . Broken finger   . Broken shoulder   . Broken toes   . Cervicalgia   . CHF (congestive heart failure) ([place]) 07/2016  . CKD (chronic kidney disease) stage 3, GFR 30-59 ml/min   . COPD (chronic obstructive pulmonary disease) ([place])   . Depression   . Elevated liver enzymes Hep B/C neg 2014  . Fall at nursing home 10/15/2016  . GERD (gastroesophageal reflux disease)   . Glaucoma   . High cholesterol   . History of alcohol use   . History of blood transfusion 09/2016   "blood got really really low"  . Hypertension   . Hypothyroidism   . Interstitial cystitis    bladder stretched every 9 months  . Migraines    "pretty much qd" (10/17/2016)  . Pneumonia 12/2016  . Psoriasis   . Stroke ([place]) 05/16/2016   Left occipital and thalamic, right hippocampal  . Thyroid disease   . Tobacco use   . 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 Vitamin B12 deficiency    Social History   Social History  . Marital status: Married    Spouse name: N/A  . Number of children: N/A  . Years of education: N/A   Social History Main Topics  . Smoking status: Former Smoker    Packs/day: 0.25    Years: 10.00    Types:  Cigarettes    Quit date: 12/1923  . Smokeless tobacco: Never Used     Comment: QUIT SMOKING 11/2016  . Alcohol use No     Comment: Hasn't had any alcohol "for 2 yrs" (10/17/2016)  . Drug use: No  . Sexual activity: Yes   Other Topics Concern  . None   Social History Narrative  Patient has been married for 13 years and lives with spouse, has 2 children, 3 grandchildren, and 1 great grandchild. She does not use walker/cane, does not clean house, does not shop, does not do yard work, and does not drive. She is retired. Previous occupation was Product manager. Completed [employer].         Epwoth Sleepiness Scale Score:  7      --I have high blood pressure   --I have had insomnia   --I seem to be losing my sex drive   --I feel stressed and lack motivation   --I wake up to urinate frequently at night   --I wake up with a dry mouth or sore throat   --I feel excessively sleepy and tired throughout the day   --I have Diabetes   --I have been told that I snore   --I have problems with memory/concentration   --I frequently awake with headaches       Family History  Problem Relation Age of Onset  . Alcohol abuse Mother   . Arthritis Mother   . Asthma Mother   . Cancer Mother        colon cancer  . Hypertension Mother   . Migraines Mother   . Stroke Mother   . Lung disease Mother   . COPD Mother   . Diabetes Father   . Hypertension Father   . Heart disease Father   . Heart attack Father   . Heart disease Paternal Grandmother   . Diabetes Paternal Grandmother   . Stroke Paternal Grandmother   . Cancer Paternal Grandmother   . Diabetes Paternal Grandfather    Scheduled Meds: . amLODipine  5 mg Oral Daily  . atorvastatin  20 mg Oral q1800  . carvedilol  6.25 mg Oral BID WC  . escitalopram  20 mg Oral Daily  . feeding supplement (NEPRO CARB STEADY)  237 mL Oral QHS  . heparin  5,000 Units Subcutaneous Q8H  . hydrochlorothiazide  25 mg Oral Daily  . insulin  aspart  0-5 Units Subcutaneous TID WC  . insulin aspart  2 Units Subcutaneous TID WC  . insulin glargine  5 Units Subcutaneous Daily  . iron polysaccharides  150 mg Oral Daily  . levothyroxine  175 mcg Oral QAC breakfast  . vancomycin  125 mg Oral QID   Followed by  . [START ON 07/15/2017] vancomycin  125 mg Oral BID   Followed by  . [START ON 07/23/2017] vancomycin  125 mg Oral Daily   Followed by  . [START ON 07/30/2017] vancomycin  125 mg Oral QODAY   Followed by  . [START ON 08/07/2017] vancomycin  125 mg Oral Q3 days   Continuous Infusions: PRN Meds:.acetaminophen **OR** acetaminophen Medications Prior to Admission:  Prior to Admission medications   Medication Sig Start Date End Date Taking? Authorizing Provider  Amino Acids-Protein Hydrolys (FEEDING SUPPLEMENT, PRO-STAT SUGAR FREE 64,) LIQD Take 30 mLs by mouth 3 (three) times daily. 06/20/17  Yes Johnson, Clanford L, MD  amLODipine (NORVASC) 5 MG tablet Take 1 tablet (5 mg total) by mouth daily. 05/15/17  Yes Patrecia Pour, Christean Grief, MD  atorvastatin (LIPITOR) 20 MG tablet Take 1 tablet (20 mg total) by mouth daily at 6 PM. 06/20/17  Yes Johnson, Clanford L, MD  carvedilol (COREG) 6.25 MG tablet Take 1 tablet (6.25 mg total) by mouth 2 (two) times daily with a meal. 06/20/17  Yes Johnson, Clanford L, MD  diphenhydrAMINE (BENADRYL) 25  MG tablet Take 2 tablets (50 mg total) by mouth every 6 (six) hours as needed for itching (tremors). 06/21/17  Yes Domenic Moras, PA-C  escitalopram (LEXAPRO) 20 MG tablet Take 1 tablet (20 mg total) by mouth every morning. 06/20/17  Yes Johnson, Clanford L, MD  furosemide (LASIX) 20 MG tablet Take 4 tablets (80 mg total) by mouth 2 (two) times daily. 06/20/17  Yes Johnson, Clanford L, MD  gabapentin (NEURONTIN) 100 MG capsule TK 1 C PO TID 05/14/17  Yes [provider]  hydrochlorothiazide (HYDRODIURIL) 25 MG tablet Take 25 mg by mouth daily.   Yes [provider]  Insulin Glargine (LANTUS) 100 UNIT/ML  Solostar Pen Inject 8 Units into the skin daily at 10 pm. 05/14/17  Yes Patrecia Pour, Christean Grief, MD  insulin lispro (HUMALOG) 100 UNIT/ML injection Inject 0.03 mLs (3 Units total) into the skin 3 (three) times daily with meals. 03/12/17  Yes Nita Sells, MD  iron polysaccharides (NIFEREX) 150 MG capsule Take 1 capsule (150 mg total) by mouth daily. 06/20/17  Yes Johnson, Clanford L, MD  levothyroxine (SYNTHROID, LEVOTHROID) 175 MCG tablet Take 1 tablet (175 mcg total) by mouth daily before breakfast. 01/07/17  Yes Sudini, Alveta Heimlich, MD  losartan (COZAAR) 50 MG tablet Take 50 mg by mouth daily.   Yes [provider]  Nutritional Supplements (FEEDING SUPPLEMENT, NEPRO CARB STEADY,) LIQD Take 237 mLs by mouth at bedtime. 06/20/17  Yes Johnson, Clanford L, MD  pantoprazole (PROTONIX) 40 MG tablet Take 40 mg by mouth daily.   Yes [provider]  potassium chloride SA (K-DUR,KLOR-CON) 20 MEQ tablet Take 1 tablet (20 mEq total) by mouth daily. 06/20/17  Yes Johnson, Clanford L, MD  metoCLOPramide (REGLAN) 5 MG tablet TK 1 T PO TID PRN FOR ABDOMINAL CRAMPING 05/14/17   [provider]  ondansetron (ZOFRAN) 4 MG tablet Take 4 mg by mouth every 8 (eight) hours as needed for nausea or vomiting.    [provider]   Allergies  Allergen Reactions  . Alprazolam Other (See Comments)    Family preference, for patient to not take med  . Percocet [Oxycodone-Acetaminophen] Other (See Comments)    Family preference, for patient to not take med  . Codeine Diarrhea and Nausea And Vomiting  . Doxycycline Diarrhea and Nausea And Vomiting  . Hydrocodone Nausea And Vomiting  . Omnicef [Cefdinir] Nausea Only and Other (See Comments)    Constipation, tolerated Zosyn  . Tramadol Other (See Comments)    Pt hallucinates when taking medication. Family request not to give medication to pt   . Augmentin [Amoxicillin-Pot Clavulanate] Hives and Rash    Has patient had a PCN reaction causing  immediate rash, facial/tongue/throat swelling, SOB or lightheadedness with hypotension: Yes Has patient had a PCN reaction causing severe rash involving mucus membranes or skin necrosis: Yes Did a PCN reaction that required hospitalization No Did PCN reaction occurring within the last 10 years: Yes If all of the above answers are "NO", then may proceed with Cephalosporin use.  Pt states she has taken penicillin since, and was ok with it...  . Ciprofloxacin Hives    Tolerated LVQ in 12/2016   Review of Systems  Constitutional: Positive for activity change and fatigue.  Neurological: Positive for weakness.   Physical Exam General: Alert, awake, in no acute distress. Chronically ill appearing. HEENT: No bruits, no goiter, no JVD Heart: Regular rate and rhythm. No murmur appreciated. Abdomen: Soft, nontender, nondistended, positive bowel sounds.  Ext: No  significant edema Skin: Warm and dry Neuro: Grossly intact, nonfocal.   Vital Signs: BP (!) 148/83 (BP Location: Right Arm)   Pulse 80   Temp 98.9 F (37.2 C) (Oral)   Resp 20   Ht '5\' 2"'$  (1.575 m)   Wt 49 kg (108 lb 0.4 oz)   SpO2 96%   BMI 19.76 kg/m  Pain Assessment: No/denies pain   Pain Score: 0-No pain   SpO2: SpO2: 96 % O2 Device:SpO2: 96 % O2 Flow Rate: .   IO: Intake/output summary:  Intake/Output Summary (Last 24 hours) at 07/03/17 1151 Last data filed at 07/02/17 1500  Gross per 24 hour  Intake              800 ml  Output                0 ml  Net              800 ml    LBM: Last BM Date: 07/03/17 Baseline Weight: Weight: 46.6 kg (102 lb 11.8 oz) Most recent weight: Weight: 49 kg (108 lb 0.4 oz)     Palliative Assessment/Data:   Flowsheet Rows     Most Recent Value  Intake Tab  Referral Department  Hospitalist  Unit at Time of Referral  Med/Surg Unit  Palliative Care Primary Diagnosis  Nephrology  Date Notified  07/02/17  Palliative Care Type  New Palliative care  Reason for referral  Clarify  Goals of Care  Date of Admission  07/01/17  Date first seen by Palliative Care  07/03/17  # of days Palliative referral response time  1 Day(s)  # of days IP prior to Palliative referral  1  Clinical Assessment  Palliative Performance Scale Score  40%  Pain Max last 24 hours  5  Pain Min Last 24 hours  3  Psychosocial & Spiritual Assessment  Palliative Care Outcomes      Time In: 1040 Time Out: 1200 Time Total: 80 Greater than 50%  of this time was spent counseling and coordinating care related to the above assessment and plan.  Signed by: Micheline Rough, MD   Please contact Palliative Medicine Team phone at [phone] for questions and concerns.  For individual provider: See Shea Evans

## 2017-07-03 NOTE — Progress Notes (Signed)
Palliative Care cons-await recc. Noted per CSW note-patient was denied further SNF days @ [facility] from recent stay.PT recc SNF.

## 2017-07-03 NOTE — Progress Notes (Signed)
Initial Nutrition Assessment  DOCUMENTATION CODES:   Severe malnutrition in context of acute illness/injury  INTERVENTION:  - Will increase Nepro Shake to BID, each supplement provides 425 kcal and 19 grams protein. - Continue to encourage PO intakes of meals and supplements. - RD will continue to monitor for POC/GOC following Palliative meeting with pt and daughters.  NUTRITION DIAGNOSIS:   Malnutrition (severe) related to acute illness, chronic illness, social / environmental circumstances as evidenced by mild depletion of body fat, moderate depletions of muscle mass, percent weight loss.  GOAL:   Patient will meet greater than or equal to 90% of their needs  MONITOR:   PO intake, Supplement acceptance, Weight trends, Labs  REASON FOR ASSESSMENT:   Malnutrition Screening Tool  ASSESSMENT:   71 y.o. 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 As per the daughter patient was discharged to skilled nursing facility 2 weeks ago after being treated for nephrotic syndrome and C. difficile colitis. Patient was discharged from the skilled nursing facility 2 days ago and was taken home. After being at home patient was lying on the couch the whole night. In the morning patient was difficult to be aroused and EMS was called. Patient's blood sugar was found to be in 30s. Patient was given D50 and patient became more alert today. Patient also had constant diarrhea. Patient was lying in a pool of diarrhea when EMS arrived.   Pt seen for MST. BMI indicates normal weight/borderline underweight. Per doc flow sheet, pt consumed 100% at 2 AM on 7/18 with no other intakes documented since admission. Visualized lunch tray with 100% completion (~555 kcal and 25 grams of protein).  Pt has Nepro Shake ordered once/day and accepted supplement yesterday and today.   No family/visitors present at this time. RD introduced self to pt and she reported need to be cleaned from [facility] and then wanted to be left to rest. Unable to obtain any information at this time but will attempt at follow-up. Palliative Care met with pt today; note from this encounter reviewed and plan is for possible d/c back to SNF if arrangements can be made for this.   Physical assessment unable to be done at this time. Physical assessment done by another RD at [facility] on 6/29 which showed mild fat wasting and moderate muscle wasting. Based on 8 lb weight loss (7% body weight) in the past two weeks (which is significant for time frame) suspect that wasting is still present and/or worsened.   Medications reviewed; 25 mg Hydrodiuril/day, sliding scale Novolog, 2 units Novolog TID, 5 units Lantus/day, 175 mcg oral Synthroid/day.  Labs reviewed; CBGs: 297-388 mg/dL today, Na: 134 mmol/L, creatinine: 3.02 mg/dL, Ca: 7.4 mg/dL, GFR: 15 mL/min.    Diet Order:  Diet renal/carb modified with fluid restriction Diet-HS Snack? Nothing; Room service appropriate? Yes; Fluid consistency: Thin  Skin:  Reviewed, no issues  Last BM:  7/19  Height:   Ht Readings from Last 1 Encounters:  07/01/17 _0  (1.575 m)    Weight:   Wt Readings from Last 1 Encounters:  07/03/17 108 lb 0.4 oz (49 kg)    Ideal Body Weight:  50 kg  BMI:  Body mass index is 19.76 kg/m.  Estimated Nutritional Needs:   Kcal:  1470-1715 (30-35 kcal/kg)  Protein:  60-70 grams (1.2-1.4 grams/kg)  Fluid:  >/= 1.8 L/day  EDUCATION NEEDS:   No education needs identified at this time    Jarome Matin, MS, RD, LDN, [facility] In[facility] Clinical Dietitian Pager # [phone] After hours/weekend pager # [phone]

## 2017-07-03 NOTE — Progress Notes (Signed)
LCSW consulted for placement considerations/discharge planning:  LCSW reviewed chart, please see most recent psychosocial [facility]. At this time, [facility].  [facility]06/30/17 and was discharged on 06/30/17 due to insurance denying further days because "patient would not participate in therapy", per confirmation [facility].  Patient was discharged home and brought right back to hospital on 07/01/17. In most recent therapy note, patient continues to refuse to work with therapy.  If patient wants to return to SNF, or if this is the recommendation, Patient must participate to return. She used a total of 10 days and has 10 remaining days if she will participate.  [facility] will consider her return if she is willing to work, however if not, she will have to discharge home with home health services.  CM has been made aware.   Will continue to follow acutely and assist with planning as patient progresses.   Deretha EmoryHannah Maaliyah Adolph LCSW, MSW Clinical Social Work: Optician, dispensingystem [employer] Coverage for :  [phone]

## 2017-07-04 DIAGNOSIS — E1121 Type 2 diabetes mellitus with diabetic nephropathy: Secondary | ICD-10-CM | POA: Diagnosis not present

## 2017-07-04 DIAGNOSIS — A0472 Enterocolitis due to Clostridium difficile, not specified as recurrent: Secondary | ICD-10-CM | POA: Diagnosis not present

## 2017-07-04 DIAGNOSIS — Z515 Encounter for palliative care: Secondary | ICD-10-CM | POA: Diagnosis not present

## 2017-07-04 DIAGNOSIS — E162 Hypoglycemia, unspecified: Secondary | ICD-10-CM | POA: Diagnosis not present

## 2017-07-04 DIAGNOSIS — Z7189 Other specified counseling: Secondary | ICD-10-CM | POA: Diagnosis not present

## 2017-07-04 LAB — BASIC METABOLIC PANEL
Anion gap: 8 (ref 5–15)
BUN: 67 mg/dL — AB (ref 6–20)
CHLORIDE: 102 mmol/L (ref 101–111)
CO2: 24 mmol/L (ref 22–32)
CREATININE: 3 mg/dL — AB (ref 0.44–1.00)
Calcium: 7.4 mg/dL — ABNORMAL LOW (ref 8.9–10.3)
GFR calc Af Amer: 17 mL/min — ABNORMAL LOW (ref >60)
GFR calc non Af Amer: 15 mL/min — ABNORMAL LOW (ref >60)
Glucose, Bld: 320 mg/dL — ABNORMAL HIGH (ref 65–99)
POTASSIUM: 4.7 mmol/L (ref 3.5–5.1)
Sodium: 134 mmol/L — ABNORMAL LOW (ref 135–145)

## 2017-07-04 LAB — CBC WITH DIFFERENTIAL/PLATELET
Basophils Absolute: 0 10*3/uL (ref 0.0–0.1)
Basophils Relative: 0 %
Eosinophils Absolute: 0.4 10*3/uL (ref 0.0–0.7)
Eosinophils Relative: 4 %
HEMATOCRIT: 26 % — AB (ref 36.0–46.0)
HEMOGLOBIN: 8.6 g/dL — AB (ref 12.0–15.0)
LYMPHS ABS: 2.3 10*3/uL (ref 0.7–4.0)
LYMPHS PCT: 26 %
MCH: 28 pg (ref 26.0–34.0)
MCHC: 33.1 g/dL (ref 30.0–36.0)
MCV: 84.7 fL (ref 78.0–100.0)
MONOS PCT: 5 %
Monocytes Absolute: 0.4 10*3/uL (ref 0.1–1.0)
NEUTROS ABS: 5.8 10*3/uL (ref 1.7–7.7)
NEUTROS PCT: 65 %
Platelets: 295 10*3/uL (ref 150–400)
RBC: 3.07 MIL/uL — ABNORMAL LOW (ref 3.87–5.11)
RDW: 14.3 % (ref 11.5–15.5)
WBC: 8.9 10*3/uL (ref 4.0–10.5)

## 2017-07-04 LAB — GLUCOSE, CAPILLARY
GLUCOSE-CAPILLARY: 520 mg/dL — AB (ref 65–99)
GLUCOSE-CAPILLARY: 84 mg/dL (ref 65–99)
Glucose-Capillary: 484 mg/dL — ABNORMAL HIGH (ref 65–99)
Glucose-Capillary: 279 mg/dL — ABNORMAL HIGH (ref 65–99)

## 2017-07-04 MED ORDER — INSULIN GLARGINE 100 UNIT/ML ~~LOC~~ SOLN
7.0000 [IU] | Freq: Every day | SUBCUTANEOUS | Status: DC
  Administered 2017-07-05: 7 [IU] via SUBCUTANEOUS
  Filled 2017-07-04: qty 0.07

## 2017-07-04 MED ORDER — INSULIN ASPART 100 UNIT/ML ~~LOC~~ SOLN
0.0000 [IU] | Freq: Three times a day (TID) | SUBCUTANEOUS | Status: DC
  Administered 2017-07-04: 5 [IU] via SUBCUTANEOUS
  Administered 2017-07-04: 10 [IU] via SUBCUTANEOUS
  Administered 2017-07-05: 7 [IU] via SUBCUTANEOUS

## 2017-07-04 MED ORDER — INSULIN ASPART 100 UNIT/ML ~~LOC~~ SOLN: 0.0000 [IU] | Freq: Every day | SUBCUTANEOUS | Status: DC

## 2017-07-04 NOTE — [place] FL2 (Signed)
MEDICAID FL2 LEVEL OF CARE SCREENING TOOL     IDENTIFICATION  Patient Name: Cassandra [name] Birthdate: 1946/10/24 Sex: female Admission Date (Current Location): 07/01/2017  [facility] and [place] Number:  Producer, television/film/video and Address:  [facility],  [address] [place], [place] [address]      Provider Number: [id number]  Attending Physician Name and Address:  Noralee Stain Chahn-Yan*  Relative Name and Phone Number:       Current Level of Care: Hospital Recommended Level of Care: Skilled Nursing Facility Prior Approval Number:    Date Approved/Denied: 06/18/17 PASRR Number: [id number]  Discharge Plan: SNF    Current Diagnoses: Patient Active Problem List   Diagnosis Date Noted  . Syncope 07/01/2017  . C. difficile colitis 07/01/2017  . Acute renal failure (ARF) (HCC) 06/14/2017  . Malnutrition of moderate degree 06/13/2017  . Acute renal failure (HCC) 06/13/2017  . CKD stage 4 due to type 2 diabetes mellitus (HCC) 06/12/2017  . Anasarca associated with disorder of kidney 06/12/2017  . Nephrotic syndrome due to type 2 diabetes mellitus (HCC) 06/12/2017  . Anasarca 05/09/2017  . Nausea vomiting and diarrhea 03/08/2017  . Constipation   . Generalized abdominal pain   . Fecal impaction (HCC) 01/04/2017  . HAP (hospital-acquired pneumonia) 12/23/2016  . HCAP (healthcare-associated pneumonia)   . Type 2 diabetes mellitus with hypoglycemia without coma (HCC)   . Hypoglycemia 12/03/2016  . Palliative care encounter   . Goals of care, counseling/discussion   . Cervical vertebral fracture (HCC) 10/17/2016  . Thoracic vertebral fracture (HCC) 10/17/2016  . Fall at nursing home 10/17/2016  . Common bile duct dilatation 10/17/2016  . Closed fracture of cervical vertebra (HCC)   . Recent Gram-negative bacteremia 10/07/2016  . Hyperkalemia   . Gastroesophageal reflux disease   . DKA, type 1 (HCC) 09/06/2016  . Protein-calorie malnutrition, severe  08/28/2016  . AKI (acute kidney injury) (HCC) 07/19/2016  . Acute on chronic diastolic CHF (congestive heart failure) (HCC) 07/19/2016  . Dehydration   . Cerebral embolism with cerebral infarction 07/13/2016  . Left ventricular diastolic dysfunction, NYHA class 1 07/11/2016  . History of CVA (cerebrovascular accident)   . HLD (hyperlipidemia)   . Anemia of chronic disease   . Physical deconditioning   . Acute on chronic kidney failure (HCC)   . Diabetic ketoacidosis with coma associated with type 1 diabetes mellitus (HCC)   . Cerebral thrombosis with cerebral infarction 05/15/2016  . Generalized anxiety disorder 05/10/2016  . Diabetic ketoacidosis without coma associated with type 1 diabetes mellitus (HCC) 05/06/2016  . Acute encephalopathy 05/06/2016  . Insulin dependent type 2 diabetes mellitus, uncontrolled (HCC)   . Depression   . CKD (chronic kidney disease) stage 3, GFR 30-59 ml/min   . Uncontrolled Hypothyroidism   . Vitamin B12 deficiency   . Benign paroxysmal positional vertigo   . Anxiety   . Allergic rhinitis   . Glaucoma   . Benign hypertension with CKD (chronic kidney disease) stage III   . Tobacco abuse   . Cervicalgia   . Elevated liver enzymes   . History of alcohol use     Orientation RESPIRATION BLADDER Height & Weight     Self, Time, Situation, Place  Normal Incontinent Weight: 104 lb 8 oz (47.4 kg) Height:  5\' 2"  (157.5 cm)  BEHAVIORAL SYMPTOMS/MOOD NEUROLOGICAL BOWEL NUTRITION STATUS      Incontinent  (Carb modified/renal diet due to stage 3 Chronic kidney disease)  AMBULATORY STATUS  COMMUNICATION OF NEEDS Skin   Limited Assist Verbally Normal (Cirrihosis legs and feet and getting oral vicomycin for C Diff)                       Personal Care Assistance Level of Assistance  Bathing, Dressing Bathing Assistance: Limited assistance   Dressing Assistance: Limited assistance     Functional Limitations Info             SPECIAL CARE FACTORS  FREQUENCY        PT Frequency: 5 OT Frequency: 5            Contractures Contractures Info: Not present    Additional Factors Info  Code Status, Allergies Code Status Info: FULL Allergies Info: Tramadol, Augmentin Amoxicillin-pot Clavulanate, Ciprofloxacin, Alprazolam, Percocet Oxycodone-acetaminophen, Codeine, Codeine, Hydrocodone, Omnicef Cefdinir     Isolation Precautions Info: Gloves and gown     Current Medications (07/04/2017):  This is the current hospital active medication list Current Facility-Administered Medications  Medication Dose Route Frequency Provider Last Rate Last Dose  . acetaminophen (TYLENOL) tablet 650 mg  650 mg Oral Q6H PRN Eduard ClosKakrakandy, Arshad N, MD   650 mg at 07/02/17 1503   Or  . acetaminophen (TYLENOL) suppository 650 mg  650 mg Rectal Q6H PRN Eduard ClosKakrakandy, Arshad N, MD      . amLODipine (NORVASC) tablet 5 mg  5 mg Oral Daily Eduard ClosKakrakandy, Arshad N, MD   5 mg at 07/04/17 [id number]  . atorvastatin (LIPITOR) tablet 20 mg  20 mg Oral q1800 Eduard ClosKakrakandy, Arshad N, MD   20 mg at 07/04/17 1812  . carvedilol (COREG) tablet 6.25 mg  6.25 mg Oral BID WC Eduard ClosKakrakandy, Arshad N, MD   6.25 mg at 07/04/17 1812  . escitalopram (LEXAPRO) tablet 20 mg  20 mg Oral Daily Eduard ClosKakrakandy, Arshad N, MD   20 mg at 07/04/17 [id number]  . feeding supplement (NEPRO CARB STEADY) liquid 237 mL  237 mL Oral BID BM Noralee StainChoi, Jennifer Chahn-Yang, DO   237 mL at 07/04/17 1400  . heparin injection 5,000 Units  5,000 Units Subcutaneous Q8H Eduard ClosKakrakandy, Arshad N, MD   5,000 Units at 07/04/17 1345  . hydrochlorothiazide (HYDRODIURIL) tablet 25 mg  25 mg Oral Daily Noralee StainChoi, Jennifer Chahn-Yang, DO   25 mg at 07/04/17 [id number]  . insulin aspart (novoLOG) injection 0-5 Units  0-5 Units Subcutaneous QHS Noralee Stainhoi, Jennifer Chahn-Yang, DO      . insulin aspart (novoLOG) injection 0-9 Units  0-9 Units Subcutaneous TID WC Noralee Stainhoi, Jennifer Chahn-Yang, DO   5 Units at 07/04/17 1811  . insulin aspart (novoLOG) injection 2 Units  2 Units  Subcutaneous TID WC Noralee Stainhoi, Jennifer Chahn-Yang, DO   2 Units at 07/04/17 1811  . [START ON 07/05/2017] insulin glargine (LANTUS) injection 7 Units  7 Units Subcutaneous Daily Noralee StainChoi, Jennifer Chahn-Yang, DO      . iron polysaccharides (NIFEREX) capsule 150 mg  150 mg Oral Daily Eduard ClosKakrakandy, Arshad N, MD   150 mg at 07/04/17 [id number]  . levothyroxine (SYNTHROID, LEVOTHROID) tablet 175 mcg  175 mcg Oral QAC breakfast Noralee StainChoi, Jennifer Chahn-Yang, DO   175 mcg at 07/04/17 [id number]  . vancomycin (VANCOCIN) 50 mg/mL oral solution 125 mg  125 mg Oral QID Eduard ClosKakrakandy, Arshad N, MD   125 mg at 07/04/17 1811   Followed by  . [START ON 07/15/2017] vancomycin (VANCOCIN) 50 mg/mL oral solution 125 mg  125 mg Oral BID Eduard ClosKakrakandy, Arshad N, MD       Followed  by  . Melene Muller ON 07/23/2017] vancomycin (VANCOCIN) 50 mg/mL oral solution 125 mg  125 mg Oral Daily Eduard Clos, MD       Followed by  . [START ON 07/30/2017] vancomycin (VANCOCIN) 50 mg/mL oral solution 125 mg  125 mg Oral QODAY Eduard Clos, MD       Followed by  . [START ON 08/07/2017] vancomycin (VANCOCIN) 50 mg/mL oral solution 125 mg  125 mg Oral Q3 days Eduard Clos, MD         Discharge Medications: Please see discharge summary for a list of discharge medications.  Relevant Imaging Results:  Relevant Lab Results:   Additional Information [id number]  Dorothe Pea Jahmez Bily, LCSWA
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
## 2017-07-04 NOTE — Progress Notes (Signed)
Palliative Care Progress Note  Reason for visit: Goals of care in light of multiple comorbid conditions complicated by c-diff infection  I met today with patient and her daughter, Barbaraann Rondo.   We discussed conversation yesterday I had with Ms. Falin when she reported that she does not think that she will be able to be cared for in her home.  She is agreeable to placement at SNF for rehab and family would like to pursue this.  She is in agreement that a good plan is to transition to SNF for rehab as they have previously been arranging. She has done well with rehabbing in the past. If she does well after rehab and continues to thrive, I encouraged they continue with this plan. If, however she is unable to regain function and he continues to decline, I recommended that she speak with his PCP to determine if he may be better served by focusing care on staying at home with support of organization such as hospice.  - Plan for d/c to SNF for rehab - Recommend palliative to follow at SNF.  Please include this on the discharge summary if agree this is appropriate.  Total time: 40 minutes Greater than 50%  of this time was spent counseling and coordinating care related to the above assessment and plan.  Micheline Rough, MD [facility] [phone]

## 2017-07-04 NOTE — Progress Notes (Signed)
Clinical Social Work Assessment  Patient Details  Name: Merril AbbeBillie A Gehling MRN: [id number] Date of Birth: 1946-07-01  Date of referral:  07/04/17               Reason for consult:  Discharge Planning                           Permission sought to share information with:  Family Supports Permission granted to share information::  Yes, Verbal Permission Granted             Name::     Maudie Mercuryharles Gills and Gar GibbonNicole King             Agency::                Relationship::  Husband and daughter             Contact Information:  [phone]-husband; [phone] - daughter  Housing/Transportation Living arrangements for the past 2 months:  Just left [facility] after a 10 day stay Place Source of Information:  Patient Patient Interpreter Needed:  None Criminal Activity/Legal Involvement Pertinent to Current Situation/Hospitalization:  No - Comment as needed Significant Relationships:  Adult Children, Spouse Lives with:  Spouse Do you feel safe going back to the place where you live?  Yes (Patient in agreement with ST rehab prior to returning home) Need for family participation in patient care:  Yes (Comment)  Care giving concerns:  Patient agreeable to ST rehab to assist her in getting stronger prior to returning home.  Social Worker assessment / plan:  CSW talked with patient at the bedside regarding discharge disposition and recommendation advised pt that per notes, pt can return to [facility] if pt agrees to participate in rehab.  Pt states she understands, reported she can't return home currently and agreed to participate in therapy if D/C'd back to [facility]. Mrs. Shular was sitting up in bed and was alert, oriented and open to talking with CSW although she presented as sad, melancholoy and fatigued. Patient reported that she gives permission for the CSW Dept and this CSW to facilitate D/C and send pt's information to [facility] via the hub. .   Employment status:   Retired Health and safety inspectornsurance information:  Systems analystManaged Medicare (UHC Medicare) PT Recommendations:  Skilled Nursing Facility Information / Referral to community resources:  Skilled Nursing Facility (SNF list not needed as patient's husband provided CSW with facility preference)  Patient/Family's Response to care:  Patient reported no concerns regarding her care during hospitalization at this time.  Patient/Family's Understanding of and Emotional Response to Diagnosis, Current Treatment, and Prognosis:  Not discussed.  Emotional Assessment Appearance:  Appears stated age Attitude/Demeanor/Rapport:  Other (Appropriate) Affect (typically observed):  Appropriate Orientation:  Oriented to Self, Oriented to Place, Oriented to  Time, Oriented to Situation Alcohol / Substance use:  Tobacco Use, Alcohol Use, Illicit Drugs (Patient reported that she quit smoking many years ago and does not smoke or use illicit drugs) Psych involvement (Current and /or in the community):  No (Comment)  Discharge Needs  Concerns to be addressed:  Discharge Planning Concerns Readmission within the last 30 days:  No Current discharge risk:  None Barriers to Discharge:  No Barriers Identified   York GriceJonathan Millenia Waldvogel, LCSWA 07/04/2017, 9:23pm PM

## 2017-07-04 NOTE — Progress Notes (Signed)
Physical Therapy Treatment Patient Details Name: Cassandra AbbeBillie A Hoxworth MRN: [id number] DOB: 04/21/1946 Today's Date: 07/04/2017    History of Present Illness 71 yo female admitted with hypoglycemia, syncope. Hx of DM, CKD, falls, CHF, anemia, COPD, depression, CVA. Recent d/c 7/6    PT Comments    Min encouragement for participation. Bladder and bowel incontinence requiring total assist for hygiene-RN assisted. Pt c/o dizziness with sitting and standing. She was able to stand and take a few steps in room before being positioned in a recliner. Encouraged pt to sit up as tolerated. Pt is weak and at high risk for falls. Continue to recommend SNF.     Follow Up Recommendations  SNF     Equipment Recommendations  None recommended by PT    Recommendations for Other Services       Precautions / Restrictions Precautions Precautions: Fall Precaution Comments: incontinent Restrictions Weight Bearing Restrictions: No    Mobility  Bed Mobility Overal bed mobility: Needs Assistance Bed Mobility: Rolling;Sidelying to Sit Rolling: Min guard Sidelying to sit: Min assist       General bed mobility comments: Assist for side to sit and to scoot to EOB. Increased time. Cues for technique. Encouragement for pt to do as much as she could.   Transfers Overall transfer level: Needs assistance Equipment used: Rolling walker (2 wheeled) Transfers: Sit to/from [employer];Stand Pivot Transfers Sit to Stand: Min assist Stand pivot transfers: Min assist       General transfer comment: Assist to rise, stabilize, control descent. VCs safety, technique, hand placement. Pt c/o dizziness. Pt preferred to pull up on walker. Stand pivot, bed to recliner, with RW  Ambulation/Gait Ambulation/Gait assistance: Min assist Ambulation Distance (Feet): 3 Feet Assistive device: Rolling walker (2 wheeled) Gait Pattern/deviations: Step-to pattern     General Gait Details: Assist to stabilize pt and maneuver  safely with RW. Very unsteady and at high risk for falls. Fatigues easily.   Stairs            Wheelchair Mobility    Modified Rankin (Stroke Patients Only)       Balance Overall balance assessment: Needs assistance   Sitting balance-Leahy Scale: Fair       Standing balance-Leahy Scale: Poor Standing balance comment: reliant on RW for stability in standing                            Cognition Arousal/Alertness: Awake/alert Behavior During Therapy: Flat affect Overall Cognitive Status: Within Functional Limits for tasks assessed                                        Exercises      General Comments        Pertinent Vitals/Pain Pain Assessment: No/denies pain    Home Living                      Prior Function            PT Goals (current goals can now be found in the care plan section) Progress towards PT goals: Progressing toward goals    Frequency    Min 3X/week      PT Plan Current plan remains appropriate    Co-evaluation              AM-PAC PT "6 Clicks" Daily Activity  Outcome Measure  Difficulty turning over in bed (including adjusting bedclothes, sheets and blankets)?: A Little Difficulty moving from lying on back to sitting on the side of the bed? : Total Difficulty sitting down on and standing up from a chair with arms (e.g., wheelchair, bedside commode, etc,.)?: Total Help needed moving to and from a bed to chair (including a wheelchair)?: A Little Help needed walking in hospital room?: A Little Help needed climbing 3-5 steps with a railing? : A Lot 6 Click Score: 13    End of Session Equipment Utilized During Treatment: Gait belt Activity Tolerance: Patient limited by fatigue Patient left: in chair;with call bell/phone within reach;with chair alarm set   PT Visit Diagnosis: Muscle weakness (generalized) (M62.81);Difficulty in walking, not elsewhere classified (R26.2)     Time:  5409-8119 PT Time Calculation (min) (ACUTE ONLY): 20 min  Charges:  $Therapeutic Activity: 8-22 mins                    G Codes:          Rebeca Alert, MPT Pager: [phone]

## 2017-07-04 NOTE — Progress Notes (Signed)
Inpatient Diabetes Program Recommendations  AACE/ADA: New Consensus Statement on Inpatient Glycemic Control (2015)  Target Ranges:  Prepandial:   less than 140 mg/dL      Peak postprandial:   less than 180 mg/dL (1-2 hours)      Critically ill patients:  140 - 180 mg/dL   Lab Results  Component Value Date   GLUCAP 520 (HH) 07/04/2017   HGBA1C 8.7 (H) 05/09/2017    Review of Glycemic Control  Spoke with pt at length regarding her glycemic control at home. States she has frequent hypoglycemia at home. Husband gives insulin. He does not check her blood sugars. Pt states she checks them. States she thinks she needs to go to "rehab" as her husband is unable to take care of her properly at home. States "it's just too much for him."  Inpatient Diabetes Program Recommendations:    Increase Levemir to 7 units QAM. If post-prandial blood sugars remain elevated, may benefit from increasing Novolog to 0-9 units tidwc and hs.  Will continue to follow.  Thank you. Ailene Ardshonda Jaxxson Cavanah, RD, LDN, CDE Inpatient Diabetes Coordinator [phone]

## 2017-07-04 NOTE — Care Management Note (Signed)
Case Management Note  Patient Details  Name: Merril AbbeBillie A Hazzard MRN: [id number] Date of Birth: Mar 13, 1946  Subjective/Objective:  Spoke to dtr-Keeley w/palliative about d/c plan prefer SNF-[facility]-CSW notified.                   Action/Plan:d/c SNF.   Expected Discharge Date:   (unknown)               Expected Discharge Plan:  Skilled Nursing Facility  In-House Referral:  Clinical Social Work  Discharge planning Services  CM Consult  Post Acute Care Choice:    Choice offered to:     DME Arranged:    DME Agency:     HH Arranged:    HH Agency:     Status of Service:  In process, will continue to follow  If discussed at Long Length of Stay Meetings, dates discussed:    Additional Comments:  Lanier ClamMahabir, Leon Montoya, RN 07/04/2017, 2:00 PM

## 2017-07-05 DIAGNOSIS — E162 Hypoglycemia, unspecified: Secondary | ICD-10-CM | POA: Diagnosis not present

## 2017-07-05 LAB — BASIC METABOLIC PANEL
Anion gap: 11 (ref 5–15)
BUN: 75 mg/dL — ABNORMAL HIGH (ref 6–20)
CHLORIDE: 102 mmol/L (ref 101–111)
CO2: 22 mmol/L (ref 22–32)
CREATININE: 3.2 mg/dL — AB (ref 0.44–1.00)
Calcium: 7.5 mg/dL — ABNORMAL LOW (ref 8.9–10.3)
GFR, EST AFRICAN AMERICAN: 16 mL/min — AB (ref >60)
GFR, EST NON AFRICAN AMERICAN: 14 mL/min — AB (ref >60)
Glucose, Bld: 269 mg/dL — ABNORMAL HIGH (ref 65–99)
Potassium: 4.5 mmol/L (ref 3.5–5.1)
SODIUM: 135 mmol/L (ref 135–145)

## 2017-07-05 LAB — CBC WITH DIFFERENTIAL/PLATELET
BASOS PCT: 0 %
Basophils Absolute: 0 10*3/uL (ref 0.0–0.1)
EOS ABS: 0.2 10*3/uL (ref 0.0–0.7)
Eosinophils Relative: 2 %
HCT: 24.5 % — ABNORMAL LOW (ref 36.0–46.0)
HEMOGLOBIN: 8.4 g/dL — AB (ref 12.0–15.0)
Lymphocytes Relative: 25 %
Lymphs Abs: 2.4 10*3/uL (ref 0.7–4.0)
MCH: 28.9 pg (ref 26.0–34.0)
MCHC: 34.3 g/dL (ref 30.0–36.0)
MCV: 84.2 fL (ref 78.0–100.0)
MONOS PCT: 4 %
Monocytes Absolute: 0.4 10*3/uL (ref 0.1–1.0)
NEUTROS PCT: 69 %
Neutro Abs: 6.6 10*3/uL (ref 1.7–7.7)
Platelets: 290 10*3/uL (ref 150–400)
RBC: 2.91 MIL/uL — AB (ref 3.87–5.11)
RDW: 14.1 % (ref 11.5–15.5)
WBC: 9.5 10*3/uL (ref 4.0–10.5)

## 2017-07-05 LAB — GLUCOSE, CAPILLARY
GLUCOSE-CAPILLARY: 341 mg/dL — AB (ref 65–99)
GLUCOSE-CAPILLARY: 392 mg/dL — AB (ref 65–99)

## 2017-07-05 MED ORDER — INSULIN GLARGINE 100 UNIT/ML SOLOSTAR PEN: 7.0000 [IU] | PEN_INJECTOR | Freq: Every day | SUBCUTANEOUS | 0 refills | Status: DC

## 2017-07-05 MED ORDER — "INSULIN SYRINGE 31G X 5/16"" 0.3 ML MISC": 0 refills | Status: AC

## 2017-07-05 MED ORDER — INSULIN LISPRO 100 UNIT/ML ~~LOC~~ SOLN: SUBCUTANEOUS | 0 refills | Status: AC

## 2017-07-05 MED ORDER — BLOOD GLUCOSE METER KIT: PACK | 0 refills | Status: AC

## 2017-07-05 MED ORDER — INSULIN PEN NEEDLE 31G X 5 MM MISC: 0 refills | Status: AC

## 2017-07-05 NOTE — Clinical Social Work Placement (Signed)
   CLINICAL SOCIAL WORK PLACEMENT  NOTE  Date:  07/05/2017  Patient Details  Name: Cassandra [name] MRN: [id number] Date of Birth: 06-Dec-1946  Clinical Social Work is seeking post-discharge placement for this patient at the Skilled  Nursing Facility level of care (*CSW will initial, date and re-position this form in  chart as items are completed):   (Pt just [facility] and relayed to SW desire to return there. Daughter in agreement.)   [facility]/family [facility] Clinical [facility]'s list of facilities offering this level of care within the geographic area requested by the patient (or if unable, by the patient's family).  Yes   Patient/family informed of their freedom to choose among providers that offer the needed level of care, that participate in Medicare, Medicaid or managed care program needed by the patient, have an available bed and are willing to accept the patient.  Yes   Patient/family informed of [facility]'s ownership interest in [facility] and [facility], as well as of the fact that they are under no obligation to receive care at these facilities.  PASRR submitted to EDS on       PASRR number received on       Existing PASRR number confirmed on 07/04/17     FL2 transmitted to all facilities in geographic area requested by pt/family on 07/04/17 (Sent to [facility] by ED SWNicola Girt- Johnathan)     FL2 transmitted to all facilities within larger geographic area on       Patient informed that his/her managed care company has contracts with or will negotiate with certain facilities, including the following:        Yes   Patient/family informed of bed offers received.  [facility]     Physician recommends and patient chooses bed at      Patient to be transferred [facility] on 07/05/17.  Patient to be transferred to facility by Ambulance Sharin Mons(PTAR)     Patient family notified on 07/05/17 of transfer.  Name  of family member notified:  Daughter:  Joni Reiningicole; unable to reach husband     PHYSICIAN Please prepare priority discharge summary, including medications, Please prepare prescriptions, Please sign FL2     Additional Comment:   OK per MD for d/c today to SNF for rehab. Patient states she is familiar with facility due to recent stay there and promises to participate in her therapy this time. She has been living at home with husband with plan to return home in the future.  Nursing notified to call report.  DC summary sent to facility for review and [facility].  Patient and daughter Joni Reiningicole are agreeable to d/c plan; unable to reach husband. Patient states she wanted [facility] notified.  No further SW needs identified and will sign off.    _______________________________________________ Darylene Pricerowder, Soumya Colson T, LCSW 07/05/2017, 11:11 AM

## 2017-07-05 NOTE — Discharge Summary (Addendum)
Physician Discharge Summary  Cassandra [name] HRC:[id number] DOB: 19-Mar-1946 DOA: 07/01/2017  PCP: Aura Dials, MD  Admit date: 07/01/2017 Discharge date: 07/05/2017  Admitted From: Home Disposition:  SNF  Recommendations for Outpatient Follow-up:  1. Follow up with PCP in 1 week 2. Follow up with Palliative care at SNF 3. Follow up with Nephrology, 07/16/2017 at 2:30pm with Dr. Justin Mend 4. 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 As per the daughter, patient was discharged to skilled nursing facility 2 weeks ago after being treated for nephrotic syndrome and C. difficile colitis. Patient was discharged from the skilled nursing facility and was home for 12 hours, was on the couch the whole night, then was found in the morning difficult to be aroused; EMS was called. Patient's blood sugar was found to be in 30s. Patient was given D50 and patient became more alert. Patient also had constant diarrhea. Patient was lying in a pool of diarrhea when EMS arrived.  Initially with hypoglycemia, DM type 2, now with hyperglycemia -Hypoglycemia likely related to insulin use and poor oral intake. Recurrent DKA in the past. Also very poor compliance with insulin at home as her husband forgets at home.  -Diabetic coordinator has been assisting with blood sugar control. Difficult to control as she ranges from hypoglycemia and hyperglycemia, but would recommend erring on side of hyperglycemia rather than over-treating with insulin for patient safety -Lantus 7 units daily in AM, Humalog 2 units TID with  meals when eating >50%, and sensitive sliding scale insulin TID with meals  CBG 121 - 150: 1 units  CBG 151 - 200: 2 units  CBG 201 - 250: 3 units  CBG 251 - 300: 5 units  CBG 301 - 350: 7 units  CBG 351 - 400: 9 units  Sliding scale insulin qhs CBG < 70: implement hypoglycemia protocol   CBG 70 - 120: 0 units   CBG 121 - 150: 0 units   CBG 151 - 200: 0 units   CBG 201 - 250: 2 units   CBG 251 - 300: 3 units   CBG 301 - 350: 4 units   CBG 351 - 400: 5 units    C Diff colitis -Finished treatment oral vanco. Stools are more formed now. Would not recommend repeating C Diff test to check for resolution as it is not indicated   CKD stage IV with nephrotic syndrome -Seen by Nephrology last admission, presumed to be secondary to diabetic nephropathy and hypertensive nephrosclerosis. Patient does not follow up with Nephrology outpatient despite recommendations. Outpatient appointment in place for 07/16/2017 at 2:30pm with Dr. Justin Mend -Cr stable currently, continue to monitor.   HTN -Continue norvasc, coreg, cozaar, hydralazine, lasix.   HLD -Continue lipitor  Hypothyroidism -Continue synthroid   Depression -Continue lexapro    Discharge Instructions  Discharge Instructions    Call MD for:  difficulty breathing, headache or visual disturbances    Complete by:  As directed    Call MD for:  extreme fatigue    Complete by:  As directed    Call MD for:  hives    Complete by:  As directed    Call MD for:  persistant dizziness or light-headedness    Complete by:  As directed    Call MD for:  persistant nausea and vomiting    Complete by:  As directed    Call MD for:  severe uncontrolled pain    Complete by:  As directed    Call MD for:  temperature >100.4    Complete by:  As directed    Diet Carb Modified    Complete by:  As directed    Discharge instructions    Complete by:  As directed    You were cared for by a hospitalist during your hospital stay. If you have any  questions about your discharge medications or the care you received while you were in the hospital after you are discharged, you can call the unit and asked to speak with the hospitalist on call if the hospitalist that took care of you is not available. Once you are discharged, your primary care physician will handle any further medical issues. Please note that NO REFILLS for any discharge medications will be authorized once you are discharged, as it is imperative that you return to your primary care physician (or establish a relationship with a primary care physician if you do not have one) for your aftercare needs so that they can reassess your need for medications and monitor your lab values.   Increase activity slowly    Complete by:  As directed      Allergies as of 07/05/2017      Reactions   Alprazolam Other (See Comments)   Family preference, for patient to not take med   Percocet [oxycodone-acetaminophen] Other (See Comments)   Family preference, for patient to not take med   Codeine Diarrhea, Nausea And Vomiting   Doxycycline Diarrhea, Nausea And Vomiting   Hydrocodone Nausea And Vomiting   Omnicef [cefdinir] Nausea Only, Other (See Comments)   Constipation, tolerated Zosyn   Tramadol Other (See Comments)   Pt hallucinates when taking medication. Family request not to give medication to pt    Augmentin [amoxicillin-pot Clavulanate] Hives, Rash   Has patient had a PCN reaction causing immediate rash, facial/tongue/throat swelling, SOB or lightheadedness with hypotension: Yes Has patient had a PCN reaction causing severe rash involving mucus membranes or skin necrosis: Yes Did a PCN reaction that required hospitalization No Did PCN reaction occurring within the last 10 years: Yes If all of the above answers are "NO", then may proceed with Cephalosporin use. Pt states she has taken penicillin since, and was ok with it...   Ciprofloxacin Hives   Tolerated LVQ in 12/2016       Medication List    STOP taking these medications   gabapentin 100 MG capsule Commonly known as:  NEURONTIN     TAKE these medications   amLODipine 5 MG tablet Commonly known as:  NORVASC Take 1 tablet (5 mg total) by mouth daily.   atorvastatin 20 MG tablet Commonly known as:  LIPITOR Take 1 tablet (20 mg total) by mouth daily at 6 PM.   blood glucose meter kit and supplies Dispense based on patient and insurance preference. Use up to four times daily as directed. (FOR ICD-9 250.00, 250.01).   carvedilol 6.25 MG tablet Commonly known as:  COREG Take 1 tablet (6.25 mg total) by mouth 2 (two) times daily with a meal.   diphenhydrAMINE 25 MG tablet Commonly known as:  BENADRYL Take 2 tablets (50 mg total)  by mouth every 6 (six) hours as needed for itching (tremors).   escitalopram 20 MG tablet Commonly known as:  LEXAPRO Take 1 tablet (20 mg total) by mouth every morning.   feeding supplement (NEPRO CARB STEADY) Liqd Take 237 mLs by mouth at bedtime.   feeding supplement (PRO-STAT SUGAR FREE 64) Liqd Take 30 mLs by mouth 3 (three) times daily.   furosemide 20 MG tablet Commonly known as:  LASIX Take 4 tablets (80 mg total) by mouth 2 (two) times daily.   hydrochlorothiazide 25 MG tablet Commonly known as:  HYDRODIURIL Take 25 mg by mouth daily.   Insulin Glargine 100 UNIT/ML Solostar Pen Commonly known as:  LANTUS Inject 7 Units into the skin daily. What changed:  how much to take  when to take this   insulin lispro 100 UNIT/ML injection Commonly known as:  HUMALOG 2 units PLUS sliding scale, three times daily with meals CBG 121 - 150: 1 units CBG 151 - 200: 2 units CBG 201 - 250: 3 units CBG 251 - 300: 5 units CBG 301 - 350: 7 units CBG 351 - 400: 9 units  Sliding every night (qhs)   CBG < 70: implement hypoglycemia protocol  CBG 70 - 120: 0 units  CBG 121 - 200: 0 units  CBG 201 - 250: 2 units  CBG 251 - 300: 3 units  CBG 301 - 350: 4 units  CBG 351 - 400: 5  units What changed:  how much to take  how to take this  when to take this  additional instructions   Insulin Pen Needle 31G X 5 MM Misc Use daily with insulin, up to 4 times daily   INSULIN SYRINGE .3CC/31GX5/16" 31G X 5/16" 0.3 ML Misc Use daily with insulin   iron polysaccharides 150 MG capsule Commonly known as:  NIFEREX Take 1 capsule (150 mg total) by mouth daily.   levothyroxine 175 MCG tablet Commonly known as:  SYNTHROID, LEVOTHROID Take 1 tablet (175 mcg total) by mouth daily before breakfast.   losartan 50 MG tablet Commonly known as:  COZAAR Take 50 mg by mouth daily.   metoCLOPramide 5 MG tablet Commonly known as:  REGLAN TK 1 T PO TID PRN FOR ABDOMINAL CRAMPING   ondansetron 4 MG tablet Commonly known as:  ZOFRAN Take 4 mg by mouth every 8 (eight) hours as needed for nausea or vomiting.   pantoprazole 40 MG tablet Commonly known as:  PROTONIX Take 40 mg by mouth daily.   potassium chloride SA 20 MEQ tablet Commonly known as:  K-DUR,KLOR-CON Take 1 tablet (20 mEq total) by mouth daily.       Contact information for follow-up providers    Aura Dials, MD. Schedule an appointment as soon as possible for a visit in 1 week(s).   Specialty:  Family Medicine Contact information: [address], [address] [place] [address] [phone]        Edrick Oh, MD. Go on 07/16/2017.   Specialty:  Nephrology Contact information: [place] [address] [phone]            Contact information for after-discharge care    Destination    [facility] Follow up.   Specialty:  Page information: [address] [place] [phone]                 Allergies  Allergen Reactions  . Alprazolam Other (See Comments)  Family preference, for patient to not take med  . Percocet [Oxycodone-Acetaminophen] Other (See Comments)    Family preference, for patient  to not take med  . Codeine Diarrhea and Nausea And Vomiting  . Doxycycline Diarrhea and Nausea And Vomiting  . Hydrocodone Nausea And Vomiting  . Omnicef [Cefdinir] Nausea Only and Other (See Comments)    Constipation, tolerated Zosyn  . Tramadol Other (See Comments)    Pt hallucinates when taking medication. Family request not to give medication to pt   . Augmentin [Amoxicillin-Pot Clavulanate] Hives and Rash    Has patient had a PCN reaction causing immediate rash, facial/tongue/throat swelling, SOB or lightheadedness with hypotension: Yes Has patient had a PCN reaction causing severe rash involving mucus membranes or skin necrosis: Yes Did a PCN reaction that required hospitalization No Did PCN reaction occurring within the last 10 years: Yes If all of the above answers are "NO", then may proceed with Cephalosporin use.  Pt states she has taken penicillin since, and was ok with it...  . Ciprofloxacin Hives    Tolerated LVQ in 12/2016    Consultations:  Palliative Care    Procedures/Studies: Dg Chest 2 View  Result Date: 06/12/2017 CLINICAL DATA:  Worsening acute on chronic anasarca. Weakness for the past week. EXAM: CHEST  2 VIEW COMPARISON:  05/09/2017. FINDINGS: Normal sized heart. Clear lungs with normal vascularity. Mild increase in size of previously demonstrated small bilateral pleural effusions, with a probable subpulmonic component on the right. Thoracic spine degenerative changes. IMPRESSION: Mild increase in size of small bilateral pleural effusions. Electronically Signed   By: Claudie Revering M.D.   On: 06/12/2017 21:19   Dg Lumbar Spine Complete  Result Date: 06/12/2017 CLINICAL DATA:  [place] today with pain in the low back EXAM: LUMBAR SPINE - COMPLETE 4+ VIEW COMPARISON:  CT abdomen pelvis of 05/15/2017 FINDINGS: The lumbar vertebrae are in normal alignment. Intervertebral disc spaces appear normal with the exception of L1-2 where there is loss of disc space and sclerosis  with spurring consistent with degenerative disc disease. No compression deformity is seen. The SI joints are corticated. IMPRESSION: No acute abnormality.  Degenerative disc disease at L1-2. Electronically Signed   By: Ivar Drape M.D.   On: 06/12/2017 15:25   Dg Sacrum/coccyx  Result Date: 06/12/2017 CLINICAL DATA:  [place] today with pain in the low back EXAM: SACRUM AND COCCYX - 2+ VIEW COMPARISON:  CT abdomen and pelvis of 05/15/2017 FINDINGS: The sacrococcygeal elements are in normal alignment. No acute fracture is seen. The pelvic rami are intact. The SI joints appear corticated. The sacral foramina appear corticated. IMPRESSION: No acute abnormality. Electronically Signed   By: Ivar Drape M.D.   On: 06/12/2017 15:23   Ct Head Wo Contrast  Result Date: 06/21/2017 CLINICAL DATA:  Fever, headache and vomiting. Dizziness with standing. History of hypertension, migraine, diabetes, stroke, falls. EXAM: CT HEAD WITHOUT CONTRAST TECHNIQUE: Contiguous axial images were obtained from the base of the skull through the vertex without intravenous contrast. COMPARISON:  CT HEAD June 12, 2017 FINDINGS: BRAIN: No intraparenchymal hemorrhage, mass effect nor midline shift. Moderate ventriculomegaly on the basis of global parenchymal brain volume loss, stable from prior imaging. 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 Patchy to confluent supratentorial white matter hypodensities. No acute large vascular territory infarct. Small area RIGHT occipital lobe encephalomalacia. No abnormal extra-axial fluid collections. Basal cisterns are patent. VASCULAR: Mild calcific atherosclerosis of the carotid siphons. SKULL: No skull fracture. No significant scalp soft tissue  swelling. SINUSES/ORBITS: The mastoid air-cells and included paranasal sinuses are well-aerated.The included ocular globes and orbital contents are non-suspicious. Status post bilateral ocular lens implants. OTHER: None. IMPRESSION: 1. No acute intracranial process. 2.  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 3. Stable moderate atrophy and moderate chronic small vessel ischemic disease. Electronically Signed   By: Elon Alas M.D.   On: 06/21/2017 19:32   Ct Head Wo Contrast  Result Date: 06/12/2017 CLINICAL DATA:  [place] this morning, neck stiffness, history hypertension, smoking, bipolar disorder, CHF, COPD, type I diabetes mellitus, stroke, stage III chronic kidney disease, COPD EXAM: CT HEAD WITHOUT CONTRAST CT CERVICAL SPINE WITHOUT CONTRAST TECHNIQUE: Multidetector CT imaging of the head and cervical spine was performed following the standard protocol without intravenous contrast. Multiplanar CT image reconstructions of the cervical spine were also generated. COMPARISON:  CT head 05/09/2017, CT cervical spine 12/11/2016 FINDINGS: CT HEAD FINDINGS Brain: Generalized atrophy. Normal ventricular morphology. No midline shift or mass effect. Small vessel chronic ischemic changes of deep cerebral white matter. Small old lacunar infarct LEFT thalamus. No intracranial hemorrhage, mass lesion, evidence of acute infarction, or extra-axial fluid collection. Vascular: Atherosclerotic calcifications of the internal carotid arteries at the skullbase. Skull: Intact Sinuses/Orbits: Clear Other: N/A CT CERVICAL SPINE FINDINGS Alignment: Minimal anterolisthesis C4-C5. Remaining alignment normal Skull base and vertebrae: Skullbase intact. Prior anterior fusion C5-C6 with incorporated bone plug. Chronic anterior height loss C7, stable. Vertebral body heights otherwise maintained without fracture or bone destruction. Facet degenerative changes bilaterally. Calcification of the anterior longitudinal ligament. Soft tissues and spinal canal: Prevertebral soft tissues normal thickness Disc levels: Disc space narrowing especially C6-C7 and C7-T1. Calcification of posterior longitudinal ligament at C2-C3. Upper chest: BILATERAL pleural  effusions at the lung apices greater on RIGHT. Other: N/A IMPRESSION: Atrophy with small vessel chronic ischemic changes of deep cerebral white matter. Old lacunar infarct LEFT thalamus. No acute intracranial abnormalities. Degenerative disc and facet disease changes of the cervical spine. Prior anterior fusion C5-C6. Chronic anterior height loss C7 unchanged. No acute cervical spine abnormalities. Electronically Signed   By: Lavonia Dana M.D.   On: 06/12/2017 15:22   Ct Cervical Spine Wo Contrast  Result Date: 06/12/2017 CLINICAL DATA:  [place] this morning, neck stiffness, history hypertension, smoking, bipolar disorder, CHF, COPD, type I diabetes mellitus, stroke, stage III chronic kidney disease, COPD EXAM: CT HEAD WITHOUT CONTRAST CT CERVICAL SPINE WITHOUT CONTRAST TECHNIQUE: Multidetector CT imaging of the head and cervical spine was performed following the standard protocol without intravenous contrast. Multiplanar CT image reconstructions of the cervical spine were also generated. COMPARISON:  CT head 05/09/2017, CT cervical spine 12/11/2016 FINDINGS: CT HEAD FINDINGS Brain: Generalized atrophy. Normal ventricular morphology. No midline shift or mass effect. Small vessel chronic ischemic changes of deep cerebral white matter. Small old lacunar infarct LEFT thalamus. No intracranial hemorrhage, mass lesion, evidence of acute infarction, or extra-axial fluid collection. Vascular: Atherosclerotic calcifications of the internal carotid arteries at the skullbase. Skull: Intact Sinuses/Orbits: Clear Other: N/A CT CERVICAL SPINE FINDINGS Alignment: Minimal anterolisthesis C4-C5. Remaining alignment normal Skull base and vertebrae: Skullbase intact. Prior anterior fusion C5-C6 with incorporated bone plug. Chronic anterior height loss C7, stable. Vertebral body heights otherwise maintained without fracture or bone destruction. Facet degenerative changes bilaterally. Calcification of the anterior longitudinal  ligament. Soft tissues and spinal canal: Prevertebral soft tissues normal thickness Disc levels: Disc space narrowing especially C6-C7 and C7-T1. Calcification of posterior longitudinal ligament at C2-C3.  Upper chest: BILATERAL pleural effusions at the lung apices greater on RIGHT. Other: N/A IMPRESSION: Atrophy with small vessel chronic ischemic changes of deep cerebral white matter. Old lacunar infarct LEFT thalamus. No acute intracranial abnormalities. Degenerative disc and facet disease changes of the cervical spine. Prior anterior fusion C5-C6. Chronic anterior height loss C7 unchanged. No acute cervical spine abnormalities. Electronically Signed   By: Lavonia Dana M.D.   On: 06/12/2017 15:22   Dg Abd 2 Views  Result Date: 06/14/2017 CLINICAL DATA:  Suprapubic abdominal pain EXAM: ABDOMEN - 2 VIEW COMPARISON:  None. FINDINGS: Prior cholecystectomy. Gas throughout nondistended large and small bowel. No convincing evidence for obstruction. No free air organomegaly. IMPRESSION: Nonspecific bowel gas pattern without convincing evidence of obstruction. Electronically Signed   By: Rolm Baptise M.D.   On: 06/14/2017 16:27   Dg Abd Acute W/chest  Result Date: 06/21/2017 CLINICAL DATA:  Fever and vomiting EXAM: DG ABDOMEN ACUTE W/ 1V CHEST COMPARISON:  Chest radiograph June 12, 2017; abdominal radiographs June 14, 2017 FINDINGS: PA chest: There is no appreciable edema or consolidation. Heart size and pulmonary vascularity are normal. No adenopathy. Supine and left lateral decubitus abdomen: There is moderate stool in the colon. There is no bowel dilatation or air-fluid level to suggest bowel obstruction. No free air. There are surgical clips in the right upper quadrant. There are foci of iliac artery atherosclerotic calcification. There also phleboliths in the pelvis. IMPRESSION: No bowel obstruction or free air. Moderate stool throughout colon. 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 No lung edema or  consolidation. Electronically Signed   By: Lowella Grip III M.D.   On: 06/21/2017 16:40       Discharge Exam: Vitals:   07/05/17 0514 07/05/17 0618  BP: (!) 169/74 (!) 148/66  Pulse:    Resp:    Temp:     Vitals:   07/04/17 2019 07/05/17 0503 07/05/17 0514 07/05/17 0618  BP: (!) 150/68 (!) 174/76 (!) 169/74 (!) 148/66  Pulse: 71 80    Resp: 19 18    Temp: 98.5 F (36.9 C) 98.6 F (37 C)    TempSrc: Oral Oral    SpO2: 98% 98%    Weight:   47.8 kg (105 lb 6.1 oz)   Height:        General: Pt is alert, awake, not in acute distress Cardiovascular: RRR, S1/S2 +, no rubs, no gallops Respiratory: CTA bilaterally, no wheezing, no rhonchi Abdominal: Soft, mildly sore to palpation, nonrigid, no guarding, ND, bowel sounds + Extremities: no edema, no cyanosis    The results of significant diagnostics from this hospitalization (including imaging, microbiology, ancillary and laboratory) are listed below for reference.     Microbiology: No results found for this or any previous visit (from the past 240 hour(s)).   Labs: BNP (last 3 results)  Recent Labs  05/09/17 1042 05/15/17 0820 06/12/17 1553  BNP 578.6* 330.7* 387.5*   Basic Metabolic Panel:  Recent Labs Lab 07/02/17 0453 07/02/17 1026 07/03/17 0451 07/04/17 0458 07/05/17 0423  NA 138 139 134* 134* 135  K 5.3* 4.2 4.8 4.7 4.5  CL 102 104 101 102 102  CO2 20* '25 24 24 22  '$ GLUCOSE 530* 211* 355* 320* 269*  BUN 61* 58* 61* 67* 75*  CREATININE 2.90* 2.97* 3.02* 3.00* 3.20*  CALCIUM 7.8* 7.7* 7.4* 7.4* 7.5*   Liver Function Tests:  Recent Labs Lab 07/01/17 1526 07/02/17 0453  AST 30 25  ALT 31 29  ALKPHOS 87  88  BILITOT 0.2* 0.6  PROT 5.8* 5.1*  ALBUMIN 2.0* 1.9*   No results for input(s): LIPASE, AMYLASE in the last 168 hours. No results for input(s): AMMONIA in the last 168 hours. CBC:  Recent Labs Lab 07/01/17 1526 07/01/17 2346 07/02/17 0453 07/03/17 0451 07/04/17 0458  07/05/17 0423  WBC 22.7* 10.3 9.0 8.8 8.9 9.5  NEUTROABS 19.6*  --  7.2 5.8 5.8 6.6  HGB 10.9* 10.4* 10.5* 9.6* 8.6* 8.4*  HCT 33.0* 32.4* 32.3* 28.9* 26.0* 24.5*  MCV 84.4 83.7 85.2 85.5 84.7 84.2  PLT 339 307 302 314 295 290   Cardiac Enzymes: No results for input(s): CKTOTAL, CKMB, CKMBINDEX, TROPONINI in the last 168 hours. BNP: Invalid input(s): POCBNP CBG:  Recent Labs Lab 07/04/17 0805 07/04/17 1156 07/04/17 1639 07/04/17 2016 07/05/17 0759  GLUCAP 484* 520* 279* 84 341*   D-Dimer No results for input(s): DDIMER in the last 72 hours. Hgb A1c No results for input(s): HGBA1C in the last 72 hours. Lipid Profile No results for input(s): CHOL, HDL, LDLCALC, TRIG, CHOLHDL, LDLDIRECT in the last 72 hours. Thyroid function studies No results for input(s): TSH, T4TOTAL, T3FREE, THYROIDAB in the last 72 hours.  Invalid input(s): FREET3 Anemia work up No results for input(s): VITAMINB12, FOLATE, FERRITIN, TIBC, IRON, RETICCTPCT in the last 72 hours. Urinalysis    Component Value Date/Time   COLORURINE YELLOW 07/02/2017 0800   APPEARANCEUR HAZY (A) 07/02/2017 0800   APPEARANCEUR Clear 06/15/2014 1508   LABSPEC 1.013 07/02/2017 0800   LABSPEC 1.010 06/15/2014 1508   PHURINE 6.0 07/02/2017 0800   GLUCOSEU >=500 (A) 07/02/2017 0800   GLUCOSEU Negative 06/15/2014 1508   HGBUR NEGATIVE 07/02/2017 0800   BILIRUBINUR NEGATIVE 07/02/2017 0800   BILIRUBINUR Negative 06/15/2014 1508   KETONESUR 20 (A) 07/02/2017 0800   PROTEINUR >=300 (A) 07/02/2017 0800   UROBILINOGEN 0.2 09/14/2015 1617   NITRITE NEGATIVE 07/02/2017 0800   LEUKOCYTESUR NEGATIVE 07/02/2017 0800   LEUKOCYTESUR Negative 06/15/2014 1508   Sepsis Labs Invalid input(s): PROCALCITONIN,  WBC,  LACTICIDVEN Microbiology No results found for this or any previous visit (from the past 240 hour(s)).   Time coordinating discharge: 40 minutes  SIGNED:  Dessa Phi, DO [facility] Pager  [phone]  If 7PM-7AM, please contact night-coverage [email or web address] Password [facility] 07/05/2017, 10:37 AM

## 2017-07-22 ENCOUNTER — Emergency Department (HOSPITAL_COMMUNITY): Payer: Medicare Other

## 2017-07-22 ENCOUNTER — Inpatient Hospital Stay (HOSPITAL_COMMUNITY): Payer: Medicare Other

## 2017-07-22 ENCOUNTER — Inpatient Hospital Stay (HOSPITAL_COMMUNITY)
Admission: EM | Admit: 2017-07-22 | Discharge: 2017-07-30 | DRG: 637 | Disposition: A | Discharge status: Hospice | Payer: Medicare Other | Attending: Nephrology | Admitting: Nephrology

## 2017-07-22 DIAGNOSIS — Z961 Presence of intraocular lens: Secondary | ICD-10-CM | POA: Diagnosis present

## 2017-07-22 DIAGNOSIS — Z9842 Cataract extraction status, left eye: Secondary | ICD-10-CM | POA: Diagnosis not present

## 2017-07-22 DIAGNOSIS — Z888 Allergy status to other drugs, medicaments and biological substances status: Secondary | ICD-10-CM

## 2017-07-22 DIAGNOSIS — N049 Nephrotic syndrome with unspecified morphologic changes: Secondary | ICD-10-CM | POA: Diagnosis present

## 2017-07-22 DIAGNOSIS — H409 Unspecified glaucoma: Secondary | ICD-10-CM | POA: Diagnosis present

## 2017-07-22 DIAGNOSIS — E785 Hyperlipidemia, unspecified: Secondary | ICD-10-CM | POA: Diagnosis present

## 2017-07-22 DIAGNOSIS — J449 Chronic obstructive pulmonary disease, unspecified: Secondary | ICD-10-CM | POA: Diagnosis present

## 2017-07-22 DIAGNOSIS — E784 Other hyperlipidemia: Secondary | ICD-10-CM

## 2017-07-22 DIAGNOSIS — Z515 Encounter for palliative care: Secondary | ICD-10-CM | POA: Diagnosis present

## 2017-07-22 DIAGNOSIS — G92 Toxic encephalopathy: Secondary | ICD-10-CM | POA: Diagnosis present

## 2017-07-22 DIAGNOSIS — R109 Unspecified abdominal pain: Secondary | ICD-10-CM | POA: Diagnosis not present

## 2017-07-22 DIAGNOSIS — E1110 Type 2 diabetes mellitus with ketoacidosis without coma: Secondary | ICD-10-CM | POA: Diagnosis present

## 2017-07-22 DIAGNOSIS — Z885 Allergy status to narcotic agent status: Secondary | ICD-10-CM | POA: Diagnosis not present

## 2017-07-22 DIAGNOSIS — E872 Acidosis: Secondary | ICD-10-CM | POA: Diagnosis not present

## 2017-07-22 DIAGNOSIS — Z66 Do not resuscitate: Secondary | ICD-10-CM | POA: Diagnosis present

## 2017-07-22 DIAGNOSIS — Z833 Family history of diabetes mellitus: Secondary | ICD-10-CM

## 2017-07-22 DIAGNOSIS — Z981 Arthrodesis status: Secondary | ICD-10-CM | POA: Diagnosis not present

## 2017-07-22 DIAGNOSIS — E1010 Type 1 diabetes mellitus with ketoacidosis without coma: Principal | ICD-10-CM | POA: Diagnosis present

## 2017-07-22 DIAGNOSIS — I5032 Chronic diastolic (congestive) heart failure: Secondary | ICD-10-CM | POA: Diagnosis present

## 2017-07-22 DIAGNOSIS — R55 Syncope and collapse: Secondary | ICD-10-CM | POA: Diagnosis not present

## 2017-07-22 DIAGNOSIS — R627 Adult failure to thrive: Secondary | ICD-10-CM | POA: Diagnosis not present

## 2017-07-22 DIAGNOSIS — Z811 Family history of alcohol abuse and dependence: Secondary | ICD-10-CM | POA: Diagnosis not present

## 2017-07-22 DIAGNOSIS — N186 End stage renal disease: Secondary | ICD-10-CM | POA: Diagnosis present

## 2017-07-22 DIAGNOSIS — N179 Acute kidney failure, unspecified: Secondary | ICD-10-CM | POA: Diagnosis present

## 2017-07-22 DIAGNOSIS — Z599 Problem related to housing and economic circumstances, unspecified: Secondary | ICD-10-CM | POA: Diagnosis not present

## 2017-07-22 DIAGNOSIS — I10 Essential (primary) hypertension: Secondary | ICD-10-CM | POA: Diagnosis not present

## 2017-07-22 DIAGNOSIS — R1084 Generalized abdominal pain: Secondary | ICD-10-CM

## 2017-07-22 DIAGNOSIS — Z825 Family history of asthma and other chronic lower respiratory diseases: Secondary | ICD-10-CM

## 2017-07-22 DIAGNOSIS — Z90710 Acquired absence of both cervix and uterus: Secondary | ICD-10-CM

## 2017-07-22 DIAGNOSIS — Z823 Family history of stroke: Secondary | ICD-10-CM

## 2017-07-22 DIAGNOSIS — K5900 Constipation, unspecified: Secondary | ICD-10-CM | POA: Diagnosis present

## 2017-07-22 DIAGNOSIS — D631 Anemia in chronic kidney disease: Secondary | ICD-10-CM | POA: Diagnosis present

## 2017-07-22 DIAGNOSIS — Z8249 Family history of ischemic heart disease and other diseases of the circulatory system: Secondary | ICD-10-CM

## 2017-07-22 DIAGNOSIS — Z794 Long term (current) use of insulin: Secondary | ICD-10-CM

## 2017-07-22 DIAGNOSIS — Z9889 Other specified postprocedural states: Secondary | ICD-10-CM

## 2017-07-22 DIAGNOSIS — E1310 Other specified diabetes mellitus with ketoacidosis without coma: Secondary | ICD-10-CM | POA: Diagnosis not present

## 2017-07-22 DIAGNOSIS — Z8619 Personal history of other infectious and parasitic diseases: Secondary | ICD-10-CM

## 2017-07-22 DIAGNOSIS — Z9049 Acquired absence of other specified parts of digestive tract: Secondary | ICD-10-CM | POA: Diagnosis not present

## 2017-07-22 DIAGNOSIS — Z87891 Personal history of nicotine dependence: Secondary | ICD-10-CM | POA: Diagnosis not present

## 2017-07-22 DIAGNOSIS — G9341 Metabolic encephalopathy: Secondary | ICD-10-CM | POA: Diagnosis not present

## 2017-07-22 DIAGNOSIS — N189 Chronic kidney disease, unspecified: Secondary | ICD-10-CM

## 2017-07-22 DIAGNOSIS — K219 Gastro-esophageal reflux disease without esophagitis: Secondary | ICD-10-CM | POA: Diagnosis present

## 2017-07-22 DIAGNOSIS — Z8673 Personal history of transient ischemic attack (TIA), and cerebral infarction without residual deficits: Secondary | ICD-10-CM

## 2017-07-22 DIAGNOSIS — M1990 Unspecified osteoarthritis, unspecified site: Secondary | ICD-10-CM | POA: Diagnosis present

## 2017-07-22 DIAGNOSIS — F32A Depression, unspecified: Secondary | ICD-10-CM | POA: Diagnosis present

## 2017-07-22 DIAGNOSIS — E109 Type 1 diabetes mellitus without complications: Secondary | ICD-10-CM | POA: Diagnosis not present

## 2017-07-22 DIAGNOSIS — E1022 Type 1 diabetes mellitus with diabetic chronic kidney disease: Secondary | ICD-10-CM | POA: Diagnosis present

## 2017-07-22 DIAGNOSIS — E10649 Type 1 diabetes mellitus with hypoglycemia without coma: Secondary | ICD-10-CM | POA: Diagnosis present

## 2017-07-22 DIAGNOSIS — N184 Chronic kidney disease, stage 4 (severe): Secondary | ICD-10-CM | POA: Diagnosis not present

## 2017-07-22 DIAGNOSIS — G9340 Encephalopathy, unspecified: Secondary | ICD-10-CM

## 2017-07-22 DIAGNOSIS — R112 Nausea with vomiting, unspecified: Secondary | ICD-10-CM

## 2017-07-22 DIAGNOSIS — Z800 Family history of malignant neoplasm of digestive organs: Secondary | ICD-10-CM

## 2017-07-22 DIAGNOSIS — E039 Hypothyroidism, unspecified: Secondary | ICD-10-CM | POA: Diagnosis present

## 2017-07-22 DIAGNOSIS — Z8261 Family history of arthritis: Secondary | ICD-10-CM

## 2017-07-22 DIAGNOSIS — F332 Major depressive disorder, recurrent severe without psychotic features: Secondary | ICD-10-CM | POA: Diagnosis not present

## 2017-07-22 DIAGNOSIS — Z9114 Patient's other noncompliance with medication regimen: Secondary | ICD-10-CM

## 2017-07-22 DIAGNOSIS — I132 Hypertensive heart and chronic kidney disease with heart failure and with stage 5 chronic kidney disease, or end stage renal disease: Secondary | ICD-10-CM | POA: Diagnosis present

## 2017-07-22 DIAGNOSIS — E860 Dehydration: Secondary | ICD-10-CM | POA: Diagnosis present

## 2017-07-22 DIAGNOSIS — E8809 Other disorders of plasma-protein metabolism, not elsewhere classified: Secondary | ICD-10-CM | POA: Diagnosis present

## 2017-07-22 DIAGNOSIS — A0472 Enterocolitis due to Clostridium difficile, not specified as recurrent: Secondary | ICD-10-CM | POA: Diagnosis not present

## 2017-07-22 DIAGNOSIS — F0390 Unspecified dementia without behavioral disturbance: Secondary | ICD-10-CM | POA: Diagnosis present

## 2017-07-22 DIAGNOSIS — F418 Other specified anxiety disorders: Secondary | ICD-10-CM | POA: Diagnosis present

## 2017-07-22 DIAGNOSIS — F329 Major depressive disorder, single episode, unspecified: Secondary | ICD-10-CM | POA: Diagnosis present

## 2017-07-22 DIAGNOSIS — Z79899 Other long term (current) drug therapy: Secondary | ICD-10-CM

## 2017-07-22 DIAGNOSIS — Z9841 Cataract extraction status, right eye: Secondary | ICD-10-CM | POA: Diagnosis not present

## 2017-07-22 LAB — GLUCOSE, CAPILLARY
GLUCOSE-CAPILLARY: 47 mg/dL — AB (ref 65–99)
GLUCOSE-CAPILLARY: 45 mg/dL — AB (ref 65–99)
GLUCOSE-CAPILLARY: 90 mg/dL (ref 65–99)
GLUCOSE-CAPILLARY: 108 mg/dL — AB (ref 65–99)
GLUCOSE-CAPILLARY: 97 mg/dL (ref 65–99)
Glucose-Capillary: 45 mg/dL — ABNORMAL LOW (ref 65–99)

## 2017-07-22 LAB — BASIC METABOLIC PANEL
ANION GAP: 23 — AB (ref 5–15)
ANION GAP: 11 (ref 5–15)
ANION GAP: 11 (ref 5–15)
BUN: 113 mg/dL — ABNORMAL HIGH (ref 6–20)
BUN: 95 mg/dL — ABNORMAL HIGH (ref 6–20)
BUN: 94 mg/dL — ABNORMAL HIGH (ref 6–20)
CALCIUM: 8.4 mg/dL — AB (ref 8.9–10.3)
CALCIUM: 7.7 mg/dL — AB (ref 8.9–10.3)
CALCIUM: 7.5 mg/dL — AB (ref 8.9–10.3)
CO2: 12 mmol/L — AB (ref 22–32)
CO2: 22 mmol/L (ref 22–32)
CO2: 16 mmol/L — ABNORMAL LOW (ref 22–32)
CREATININE: 4.11 mg/dL — AB (ref 0.44–1.00)
Chloride: 99 mmol/L — ABNORMAL LOW (ref 101–111)
Chloride: 106 mmol/L (ref 101–111)
Chloride: 107 mmol/L (ref 101–111)
Creatinine, Ser: 3.18 mg/dL — ABNORMAL HIGH (ref 0.44–1.00)
Creatinine, Ser: 3.22 mg/dL — ABNORMAL HIGH (ref 0.44–1.00)
GFR, EST AFRICAN AMERICAN: 12 mL/min — AB (ref >60)
GFR, EST AFRICAN AMERICAN: 16 mL/min — AB (ref >60)
GFR, EST AFRICAN AMERICAN: 16 mL/min — AB (ref >60)
GFR, EST NON AFRICAN AMERICAN: 10 mL/min — AB (ref >60)
GFR, EST NON AFRICAN AMERICAN: 14 mL/min — AB (ref >60)
GFR, EST NON AFRICAN AMERICAN: 14 mL/min — AB (ref >60)
GLUCOSE: 519 mg/dL — AB (ref 65–99)
GLUCOSE: 51 mg/dL — AB (ref 65–99)
GLUCOSE: 67 mg/dL (ref 65–99)
POTASSIUM: 3.7 mmol/L (ref 3.5–5.1)
POTASSIUM: 4.9 mmol/L (ref 3.5–5.1)
Potassium: 3.7 mmol/L (ref 3.5–5.1)
SODIUM: 139 mmol/L (ref 135–145)
SODIUM: 134 mmol/L — AB (ref 135–145)
Sodium: 134 mmol/L — ABNORMAL LOW (ref 135–145)

## 2017-07-22 LAB — PHOSPHORUS: Phosphorus: 6.5 mg/dL — ABNORMAL HIGH (ref 2.5–4.6)

## 2017-07-22 LAB — TROPONIN I

## 2017-07-22 LAB — CBC
HCT: 23.7 % — ABNORMAL LOW (ref 36.0–46.0)
Hemoglobin: 7.5 g/dL — ABNORMAL LOW (ref 12.0–15.0)
MCH: 27 pg (ref 26.0–34.0)
MCHC: 31.6 g/dL (ref 30.0–36.0)
MCV: 85.3 fL (ref 78.0–100.0)
PLATELETS: 397 10*3/uL (ref 150–400)
RBC: 2.78 MIL/uL — ABNORMAL LOW (ref 3.87–5.11)
RDW: 14.1 % (ref 11.5–15.5)
WBC: 11.3 10*3/uL — ABNORMAL HIGH (ref 4.0–10.5)

## 2017-07-22 LAB — I-STAT VENOUS BLOOD GAS, ED
ACID-BASE DEFICIT: 13 mmol/L — AB (ref 0.0–2.0)
Bicarbonate: 14.2 mmol/L — ABNORMAL LOW (ref 20.0–28.0)
O2 SAT: 60 %
PH VEN: 7.208 — AB (ref 7.250–7.430)
TCO2: 15 mmol/L (ref 0–100)
pCO2, Ven: 35.7 mmHg — ABNORMAL LOW (ref 44.0–60.0)
pO2, Ven: 38 mmHg (ref 32.0–45.0)

## 2017-07-22 LAB — MAGNESIUM: MAGNESIUM: 1.9 mg/dL (ref 1.7–2.4)

## 2017-07-22 LAB — CBG MONITORING, ED
GLUCOSE-CAPILLARY: 516 mg/dL — AB (ref 65–99)
GLUCOSE-CAPILLARY: 297 mg/dL — AB (ref 65–99)
GLUCOSE-CAPILLARY: 83 mg/dL (ref 65–99)
Glucose-Capillary: 158 mg/dL — ABNORMAL HIGH (ref 65–99)

## 2017-07-22 LAB — BETA-HYDROXYBUTYRIC ACID: Beta-Hydroxybutyric Acid: 0.05 mmol/L (ref 0.05–0.27)

## 2017-07-22 LAB — LACTIC ACID, PLASMA: LACTIC ACID, VENOUS: 1.2 mmol/L (ref 0.5–1.9)

## 2017-07-22 MED ORDER — SODIUM CHLORIDE 0.9 % IV SOLN: INTRAVENOUS | Status: AC

## 2017-07-22 MED ORDER — HYDRALAZINE HCL 20 MG/ML IJ SOLN
5.0000 mg | INTRAMUSCULAR | Status: DC | PRN
  Administered 2017-07-23 – 2017-07-28 (×4): 5 mg via INTRAVENOUS
  Filled 2017-07-22 (×4): qty 1

## 2017-07-22 MED ORDER — SODIUM CHLORIDE 0.9 % IV BOLUS (SEPSIS)
1000.0000 mL | Freq: Once | INTRAVENOUS | Status: AC
  Administered 2017-07-22: 1000 mL via INTRAVENOUS

## 2017-07-22 MED ORDER — INSULIN ASPART 100 UNIT/ML ~~LOC~~ SOLN
0.0000 [IU] | Freq: Three times a day (TID) | SUBCUTANEOUS | Status: DC
  Administered 2017-07-23 (×2): 9 [IU] via SUBCUTANEOUS
  Administered 2017-07-23: 2 [IU] via SUBCUTANEOUS
  Administered 2017-07-24: 5 [IU] via SUBCUTANEOUS
  Administered 2017-07-24: 2 [IU] via SUBCUTANEOUS

## 2017-07-22 MED ORDER — ESCITALOPRAM OXALATE 20 MG PO TABS
20.0000 mg | ORAL_TABLET | ORAL | Status: DC
  Administered 2017-07-23 – 2017-07-30 (×8): 20 mg via ORAL
  Filled 2017-07-22: qty 2
  Filled 2017-07-22: qty 1
  Filled 2017-07-22 (×3): qty 2
  Filled 2017-07-22 (×2): qty 1
  Filled 2017-07-22 (×2): qty 2

## 2017-07-22 MED ORDER — INSULIN REGULAR HUMAN 100 UNIT/ML IJ SOLN
INTRAMUSCULAR | Status: DC
  Filled 2017-07-22: qty 1

## 2017-07-22 MED ORDER — DEXTROSE 50 % IV SOLN: 1.0000 | Freq: Once | INTRAVENOUS | Status: AC

## 2017-07-22 MED ORDER — PRO-STAT SUGAR FREE PO LIQD
30.0000 mL | Freq: Three times a day (TID) | ORAL | Status: DC
  Administered 2017-07-24 – 2017-07-28 (×7): 30 mL via ORAL
  Filled 2017-07-22 (×16): qty 30

## 2017-07-22 MED ORDER — HEPARIN SODIUM (PORCINE) 5000 UNIT/ML IJ SOLN
5000.0000 [IU] | Freq: Three times a day (TID) | INTRAMUSCULAR | Status: DC
  Administered 2017-07-22 – 2017-07-30 (×23): 5000 [IU] via SUBCUTANEOUS
  Filled 2017-07-22 (×23): qty 1

## 2017-07-22 MED ORDER — INSULIN ASPART 100 UNIT/ML ~~LOC~~ SOLN: 0.0000 [IU] | Freq: Every day | SUBCUTANEOUS | Status: DC

## 2017-07-22 MED ORDER — POTASSIUM CHLORIDE CRYS ER 20 MEQ PO TBCR: 20.0000 meq | EXTENDED_RELEASE_TABLET | Freq: Every day | ORAL | Status: DC

## 2017-07-22 MED ORDER — SODIUM CHLORIDE 0.9 % IV SOLN: INTRAVENOUS | Status: DC

## 2017-07-22 MED ORDER — DEXTROSE-NACL 5-0.45 % IV SOLN: INTRAVENOUS | Status: DC

## 2017-07-22 MED ORDER — ATORVASTATIN CALCIUM 20 MG PO TABS
20.0000 mg | ORAL_TABLET | Freq: Every day | ORAL | Status: DC
  Administered 2017-07-22 – 2017-07-29 (×8): 20 mg via ORAL
  Filled 2017-07-22 (×8): qty 1

## 2017-07-22 MED ORDER — POLYSACCHARIDE IRON COMPLEX 150 MG PO CAPS
150.0000 mg | ORAL_CAPSULE | Freq: Every day | ORAL | Status: DC
  Administered 2017-07-23 – 2017-07-30 (×8): 150 mg via ORAL
  Filled 2017-07-22 (×9): qty 1

## 2017-07-22 MED ORDER — POTASSIUM CHLORIDE 10 MEQ/100ML IV SOLN
10.0000 meq | INTRAVENOUS | Status: AC
  Administered 2017-07-22 (×4): 10 meq via INTRAVENOUS
  Filled 2017-07-22 (×4): qty 100

## 2017-07-22 MED ORDER — DEXTROSE 50 % IV SOLN
INTRAVENOUS | Status: AC
  Administered 2017-07-22: 25 mL via INTRAVENOUS
  Filled 2017-07-22: qty 50

## 2017-07-22 MED ORDER — DEXTROSE 50 % IV SOLN
25.0000 mL | INTRAVENOUS | Status: DC | PRN
  Administered 2017-07-22: 25 mL via INTRAVENOUS

## 2017-07-22 MED ORDER — LEVOTHYROXINE SODIUM 75 MCG PO TABS
175.0000 ug | ORAL_TABLET | Freq: Every day | ORAL | Status: DC
  Administered 2017-07-23 – 2017-07-30 (×8): 175 ug via ORAL
  Filled 2017-07-22 (×8): qty 1

## 2017-07-22 MED ORDER — METOCLOPRAMIDE HCL 10 MG PO TABS
5.0000 mg | ORAL_TABLET | Freq: Three times a day (TID) | ORAL | Status: DC | PRN
  Administered 2017-07-23 – 2017-07-28 (×3): 5 mg via ORAL
  Filled 2017-07-22 (×4): qty 1

## 2017-07-22 MED ORDER — DEXTROSE-NACL 5-0.45 % IV SOLN
INTRAVENOUS | Status: DC
  Administered 2017-07-22: 14:00:00 via INTRAVENOUS

## 2017-07-22 MED ORDER — INSULIN REGULAR BOLUS VIA INFUSION
0.0000 [IU] | Freq: Three times a day (TID) | INTRAVENOUS | Status: DC
  Filled 2017-07-22: qty 10

## 2017-07-22 MED ORDER — SODIUM CHLORIDE 0.9 % IV SOLN
INTRAVENOUS | Status: DC
  Administered 2017-07-22: 2.4 [IU]/h via INTRAVENOUS
  Filled 2017-07-22: qty 1

## 2017-07-22 NOTE — Progress Notes (Signed)
When patient arrived to unit her blood sugar was taken and resulted as 47. Insulin drip stopped and snack provided. Next blood sugar was 45, followed by another 45. After patient consumed more PO and received an amp of D50 patient's blood sugar went up to 90 then 108. Dr. Konrad DoloresMerrell wants us to go ahead and put patient on sliding scale and to keep a close watch on blood sugars and lab work. Patient's anion gap is closed but he expressed concern that it may open again easily. He did not want to give any long acting insulin at this time due to patient being so low. Patient is able to express that she is hungry and is oriented to person and place. Will continue to monitor and support.

## 2017-07-22 NOTE — H&P (Signed)
History and Physical    Cassandra [name] WCH:[id number] DOB: Apr 27, 1946 DOA: 07/22/2017  PCP: Aura Dials, MD Patient coming from: [place]  Chief Complaint: Cassandra [name] poorly  HPI: Cassandra [name] is a 71 y.o. female with medical history significant of C KD, BPPV, bipolar disorder, depression/anxiety, anemia, CHF, GERD, hyperlipidemia, hypertension, interstitial cystitis, migraines, CVA, thyroid dysfunction, diabetes.  Level V caveat applies as patient's metal status is somewhat dampened. Patient able to provide some general symptom descriptions was unable to give specifics regards to which symptoms she was feeling when.  Per report by SNF staff patient has had a glucose level in the 200s over the last couple of weeks. However, this increased into the 500s over the last day. She was given additional insulin and fluids without any improvement of her glucose. Currently patient complaining of abdominal discomfort and nausea and several weeks of diarrhea. Patient states she has had antibiotics recently but is unsure of when that actually occurred. Denies fevers, leg swelling, dysuria. Patient continues to fall asleep during exam and is unable to provide any further history including pertinent review of systems.   ED Course: Pt noted to be in DKA and started on glucose stabilizer.   Review of Systems: As per HPI otherwise all other systems reviewed and are negative  Ambulatory Status:unclear  Past Medical History:  Diagnosis Date  . Allergic rhinitis   . Anemia   . Anxiety   . Arthritis    "mostly feet, hands" (10/17/2016)  . Benign hypertension with CKD (chronic kidney disease) stage III   . Benign paroxysmal positional vertigo   . Bipolar disorder ([place])   . Broken finger   . Broken shoulder   . Broken toes   . Cervicalgia   . CHF (congestive heart failure) ([place]) 07/2016  . CKD (chronic kidney disease) stage 3, GFR 30-59 ml/min   . COPD (chronic  obstructive pulmonary disease) ([place])   . Depression   . Elevated liver enzymes Hep B/C neg 2014  . Fall at nursing home 10/15/2016  . GERD (gastroesophageal reflux disease)   . Glaucoma   . High cholesterol   . History of alcohol use   . History of blood transfusion 09/2016   "blood got really really low"  . Hypertension   . Hypothyroidism   . Interstitial cystitis    bladder stretched every 9 months  . Migraines    "pretty much qd" (10/17/2016)  . Pneumonia 12/2016  . Psoriasis   . Stroke ([place]) 05/16/2016   Left occipital and thalamic, right hippocampal  . Thyroid disease   . Tobacco use   . 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 Vitamin B12 deficiency     Past Surgical History:  Procedure Laterality Date  . ANTERIOR CERVICAL DECOMP/DISCECTOMY FUSION  ~ 2009  . APPENDECTOMY    . BACK SURGERY    . BLADDER SUSPENSION    . CATARACT EXTRACTION W/ INTRAOCULAR LENS  IMPLANT, BILATERAL Bilateral   . DILATION AND CURETTAGE OF UTERUS    . HERNIA REPAIR    . LAPAROSCOPIC CHOLECYSTECTOMY    . TONSILLECTOMY    . TUBAL LIGATION    . VAGINAL HYSTERECTOMY     with oophorectomy    Social History   Social History  . Marital status: Married    Spouse name: N/A  . Number of children: N/A  . Years of education: N/A   Occupational History  . Not on file.  Social History Main Topics  . Smoking status: Former Smoker    Packs/day: 0.25    Years: 10.00    Types: Cigarettes    Quit date: 12/1923  . Smokeless tobacco: Never Used     Comment: QUIT SMOKING 11/2016  . Alcohol use No     Comment: Hasn't had any alcohol "for 2 yrs" (10/17/2016)  . Drug use: No  . Sexual activity: Yes   Other Topics Concern  . Not on file   Social History Narrative   Patient has been married for 13 years and lives with spouse, has 2 children, 3 grandchildren, and 1 great grandchild. She does not use walker/cane, does not clean house, does not shop, does not do yard work, and  does not drive. She is retired. Previous occupation was Product manager. Completed [employer].         Epwoth Sleepiness Scale Score:  7      --I have high blood pressure   --I have had insomnia   --I seem to be losing my sex drive   --I feel stressed and lack motivation   --I wake up to urinate frequently at night   --I wake up with a dry mouth or sore throat   --I feel excessively sleepy and tired throughout the day   --I have Diabetes   --I have been told that I snore   --I have problems with memory/concentration   --I frequently awake with headaches        Allergies  Allergen Reactions  . Alprazolam Other (See Comments)    Family preference, for patient to not take med  . Percocet [Oxycodone-Acetaminophen] Other (See Comments)    Family preference, for patient to not take med  . Codeine Diarrhea and Nausea And Vomiting  . Doxycycline Diarrhea and Nausea And Vomiting  . Hydrocodone Nausea And Vomiting  . Omnicef [Cefdinir] Nausea Only and Other (See Comments)    Constipation, tolerated Zosyn  . Tramadol Other (See Comments)    Pt hallucinates when taking medication. Family request not to give medication to pt   . Augmentin [Amoxicillin-Pot Clavulanate] Hives and Rash    Has patient had a PCN reaction causing immediate rash, facial/tongue/throat swelling, SOB or lightheadedness with hypotension: Yes Has patient had a PCN reaction causing severe rash involving mucus membranes or skin necrosis: Yes Did a PCN reaction that required hospitalization No Did PCN reaction occurring within the last 10 years: Yes If all of the above answers are "NO", then may proceed with Cephalosporin use.  Pt states she has taken penicillin since, and was ok with it...  . Ciprofloxacin Hives    Tolerated LVQ in 12/2016    Family History  Problem Relation Age of Onset  . Alcohol abuse Mother   . Arthritis Mother   . Asthma Mother   . Cancer Mother        colon cancer  .  Hypertension Mother   . Migraines Mother   . Stroke Mother   . Lung disease Mother   . COPD Mother   . Diabetes Father   . Hypertension Father   . Heart disease Father   . Heart attack Father   . Heart disease Paternal Grandmother   . Diabetes Paternal Grandmother   . Stroke Paternal Grandmother   . Cancer Paternal Grandmother   . Diabetes Paternal Grandfather       Prior to Admission medications   Medication Sig Start Date End Date Taking? Authorizing  Provider  Amino Acids-Protein Hydrolys (FEEDING SUPPLEMENT, PRO-STAT SUGAR FREE 64,) LIQD Take 30 mLs by mouth 3 (three) times daily. 06/20/17  Yes Johnson, Clanford L, MD  amLODipine (NORVASC) 5 MG tablet Take 1 tablet (5 mg total) by mouth daily. 05/15/17  Yes Patrecia Pour, Christean Grief, MD  atorvastatin (LIPITOR) 20 MG tablet Take 1 tablet (20 mg total) by mouth daily at 6 PM. 06/20/17  Yes Johnson, Clanford L, MD  blood glucose meter kit and supplies Dispense based on patient and insurance preference. Use up to four times daily as directed. (FOR ICD-9 250.00, 250.01). 07/05/17  Yes Dessa Phi Chahn-Yang, DO  carvedilol (COREG) 6.25 MG tablet Take 1 tablet (6.25 mg total) by mouth 2 (two) times daily with a meal. 06/20/17  Yes Johnson, Clanford L, MD  diphenhydrAMINE (BENADRYL) 25 MG tablet Take 2 tablets (50 mg total) by mouth every 6 (six) hours as needed for itching (tremors). 06/21/17  Yes Domenic Moras, PA-C  escitalopram (LEXAPRO) 20 MG tablet Take 1 tablet (20 mg total) by mouth every morning. 06/20/17  Yes Johnson, Clanford L, MD  furosemide (LASIX) 20 MG tablet Take 4 tablets (80 mg total) by mouth 2 (two) times daily. Patient taking differently: Take 40 mg by mouth 2 (two) times daily.  06/20/17  Yes Johnson, Clanford L, MD  hydrochlorothiazide (HYDRODIURIL) 25 MG tablet Take 25 mg by mouth daily.   Yes [provider]  Insulin Glargine (LANTUS) 100 UNIT/ML Solostar Pen Inject 7 Units into the skin daily. 07/05/17 08/17/17 Yes Choi,  Anderson [place] Chahn-Yang, DO  insulin lispro (HUMALOG) 100 UNIT/ML injection 2 units PLUS sliding scale, three times daily with meals CBG 121 - 150: 1 units CBG 151 - 200: 2 units CBG 201 - 250: 3 units CBG 251 - 300: 5 units CBG 301 - 350: 7 units CBG 351 - 400: 9 units  Sliding every night (qhs)   CBG < 70: implement hypoglycemia protocol  CBG 70 - 120: 0 units  CBG 121 - 200: 0 units  CBG 201 - 250: 2 units  CBG 251 - 300: 3 units  CBG 301 - 350: 4 units  CBG 351 - 400: 5 units Patient taking differently: Inject 2 Units into the skin See admin instructions. 2 units with meals plus 0-69=initiate hypoglycemic protocol, 70-200=0 units, 201-250=2 units, 251-300=3 units, 301-350=4 units, 351-400=5 units, >400 notify MD 07/05/17  Yes Dessa Phi Chahn-Yang, DO  Insulin Pen Needle 31G X 5 MM MISC Use daily with insulin, up to 4 times daily 07/05/17  Yes Dessa Phi Chahn-Yang, DO  Insulin Syringe-Needle U-100 (INSULIN SYRINGE .3CC/31GX5/16") 31G X 5/16" 0.3 ML MISC Use daily with insulin 07/05/17  Yes Dessa Phi Chahn-Yang, DO  iron polysaccharides (NIFEREX) 150 MG capsule Take 1 capsule (150 mg total) by mouth daily. 06/20/17  Yes Johnson, Clanford L, MD  levothyroxine (SYNTHROID, LEVOTHROID) 175 MCG tablet Take 1 tablet (175 mcg total) by mouth daily before breakfast. 01/07/17  Yes Sudini, Alveta Heimlich, MD  loperamide (IMODIUM) 2 MG capsule Take 4 mg by mouth 2 (two) times daily as needed for diarrhea or loose stools.   Yes [provider]  losartan (COZAAR) 50 MG tablet Take 50 mg by mouth daily.   Yes [provider]  metoCLOPramide (REGLAN) 5 MG tablet TK 1 T PO TID PRN FOR ABDOMINAL CRAMPING 05/14/17  Yes [provider]  Nutritional Supplements (FEEDING SUPPLEMENT, NEPRO CARB STEADY,) LIQD Take 237 mLs by mouth at bedtime. 06/20/17  Yes Johnson, Clanford L,  MD  ondansetron (ZOFRAN) 4 MG tablet Take 4 mg by mouth every 8 (eight) hours as needed for nausea or vomiting.    Yes [provider]  pantoprazole (PROTONIX) 40 MG tablet Take 40 mg by mouth daily.   Yes [provider]  potassium chloride SA (K-DUR,KLOR-CON) 20 MEQ tablet Take 1 tablet (20 mEq total) by mouth daily. 06/20/17  Yes Johnson, Clanford L, MD  triamcinolone (KENALOG) 0.025 % cream Apply 1 application topically 2 (two) times daily.   Yes [provider]    Physical Exam: Vitals:   07/22/17 1115 07/22/17 1130 07/22/17 1145 07/22/17 1200  BP: 137/69 131/67 122/79 129/66  Pulse: 69 71 69 68  Resp: _0 Temp:      TempSrc:      SpO2: 100% 100% 100% 98%  Weight:      Height:         General: sleeping in bed. Elderly. Frail appearing  Eyes:  PERRL, EOMI, normal lids, iris ENT: Normal hearing, dry mucous membranes Neck:  no LAD, masses or thyromegaly Cardiovascular:  RRR, II/VI systolic murmur. Trace LE edema.  Respiratory:  CTA bilaterally, no w/r/r. Normal respiratory effort. Abdomen:  soft, ntnd, NABS Skin:  no rash or induration seen on limited exam Musculoskeletal:  grossly normal tone BUE/BLE, good ROM, no bony abnormality Psychiatric: Pleasant. Follows commands. Alert and oriented 3. Neurologic:  CN 2-12 grossly intact, moves all extremities in coordinated fashion, sensation intact  Labs on Admission: I have personally reviewed following labs and imaging studies  CBC:  Recent Labs Lab 07/22/17 1041  WBC 11.3*  HGB 7.5*  HCT 23.7*  MCV 85.3  PLT 021   Basic Metabolic Panel:  Recent Labs Lab 07/22/17 1041  NA 134*  K 3.7  CL 99*  CO2 12*  GLUCOSE 519*  BUN 113*  CREATININE 4.11*  CALCIUM 8.4*   GFR: Estimated Creatinine Clearance: 9.7 mL/min (A) (by C-G formula based on SCr of 4.11 mg/dL (H)). Liver Function Tests: No results for input(s): AST, ALT, ALKPHOS, BILITOT, PROT, ALBUMIN in the last 168 hours. No results for input(s): LIPASE, AMYLASE in the last 168 hours. No results for input(s): AMMONIA in the last 168  hours. Coagulation Profile: No results for input(s): INR, PROTIME in the last 168 hours. Cardiac Enzymes: No results for input(s): CKTOTAL, CKMB, CKMBINDEX, TROPONINI in the last 168 hours. BNP (last 3 results) No results for input(s): PROBNP in the last 8760 hours. HbA1C: No results for input(s): HGBA1C in the last 72 hours. CBG:  Recent Labs Lab 07/22/17 1018 07/22/17 1206  GLUCAP 516* 297*   Lipid Profile: No results for input(s): CHOL, HDL, LDLCALC, TRIG, CHOLHDL, LDLDIRECT in the last 72 hours. Thyroid Function Tests: No results for input(s): TSH, T4TOTAL, FREET4, T3FREE, THYROIDAB in the last 72 hours. Anemia Panel: No results for input(s): VITAMINB12, FOLATE, FERRITIN, TIBC, IRON, RETICCTPCT in the last 72 hours. Urine analysis:    Component Value Date/Time   COLORURINE YELLOW 07/02/2017 0800   APPEARANCEUR HAZY (A) 07/02/2017 0800   APPEARANCEUR Clear 06/15/2014 1508   LABSPEC 1.013 07/02/2017 0800   LABSPEC 1.010 06/15/2014 1508   PHURINE 6.0 07/02/2017 0800   GLUCOSEU >=500 (A) 07/02/2017 0800   GLUCOSEU Negative 06/15/2014 1508   HGBUR NEGATIVE 07/02/2017 0800   BILIRUBINUR NEGATIVE 07/02/2017 0800   BILIRUBINUR Negative 06/15/2014 1508   KETONESUR 20 (A) 07/02/2017 0800   PROTEINUR >=300 (A) 07/02/2017 0800   UROBILINOGEN 0.2 09/14/2015 1617  NITRITE NEGATIVE 07/02/2017 0800   LEUKOCYTESUR NEGATIVE 07/02/2017 0800   LEUKOCYTESUR Negative 06/15/2014 1508    Creatinine Clearance: Estimated Creatinine Clearance: 9.7 mL/min (A) (by C-G formula based on SCr of 4.11 mg/dL (H)).  Sepsis Labs: _0 (procalcitonin:4,lacticidven:4) )No results found for this or any previous visit (from the past 240 hour(s)).   Radiological Exams on Admission: Dg Chest Port 1 View  Result Date: 07/22/2017 CLINICAL DATA:  Hyperglycemia. EXAM: PORTABLE CHEST 1 VIEW COMPARISON:  Chest x-ray dated June 21, 2017. FINDINGS: The cardiomediastinal silhouette is normal in size.  Normal pulmonary vascularity. No focal consolidation, pleural effusion, or pneumothorax. No acute osseous abnormality. IMPRESSION: No active cardiopulmonary disease. Electronically Signed   By: Titus Dubin M.D.   On: 07/22/2017 11:05    EKG: Independently reviewed. Sinus. No ACS  Assessment/Plan Active Problems:   Acute encephalopathy   Essential hypertension   HLD (hyperlipidemia)   Acute on chronic kidney failure (HCC)   Generalized abdominal pain   DKA (diabetic ketoacidoses) (HCC)   Chronic diastolic CHF (congestive heart failure) ([place])    DKA: Glucose 519, bicarbonate 12, anion gap 23. Unsure of specific trigger. Started on glucose stabilizer in the ED. - Stepdown - Glucose stabilizer - resume Lantus 2hrs prior to DC stabilizer - Lactic acid, beta hydroxy - Transition to subcutaneous insulin after anion gap closes 2  Acute metabolic encephalopathy: Likely secondary to DKA. VBG showing a pH 7.20, PCO2 35, PO2 38, bicarbonate 15. Cannot rule out infectious process given current complaints and extensive urologic surgical history and current mental status. Patient complaining of abdominal discomfort multiple week history of diarrhea and questionable recent antibiotic administration. - Urinalysis, urine culture, I/O cath (concern for urinary retention). - KUB - C. difficile PCR  Abd pain: DM gastroparesis vs infectious process.  - workup as above - continue Reglan  AoCKD: Cr 4.11, baseline Cr 2.9. Secondary to DKA - IVF - treatment as above  CHF: Diastolic. Last echo showing an EF of 65%. Well compensated. - Resume lasix in am - I/O, daily wts.   HTN: normotensive - Hold HCTZ, losartan, norvasc, coreg (resume when condition stabilizes) - Hydralazine prn  HLD: - continue lipitor  GERD; - Hold PPI until after Cdiff studies.   Hypothyroid: - continue synthroid.    DVT prophylaxis: hep  Code Status: full  Family Communication: none  Disposition Plan: pending  improvement  Consults called: none  Admission status: inpt    Nicholes Hibler J MD [facility]  If 7PM-7AM, please contact night-coverage [email or web address] Password [email or web address]  07/22/2017, 1:20 PM

## 2017-07-22 NOTE — ED Notes (Signed)
Patient at xray

## 2017-07-22 NOTE — ED Triage Notes (Signed)
Patient comes in per [facility] with hyperglycemia. [facility]. N/v/d x2 days. Weak and lethargic. A/ox4. Hyperglycemic. 553 with EMS. 10 units of novolog then 5 units novolog and long-acting insulin given by facility. Hx DM2 and comorbities. Patient has been running high cbg for the past few weeks she's been in the 200s. Ems v/s 132/62, 70 HR, 16 RR, 99% RA. Only c/o h/a with hx of migraines and nausea from patient. Given 4 mg zofran PO per EMS. EKG by EMS SR with PACs.

## 2017-07-22 NOTE — Progress Notes (Signed)
CSW was [facility]     Claude MangesKierra S. Atleigh Gruen, MSW, LCSW-A Emergency Department Clinical Social Worker [phone]

## 2017-07-22 NOTE — ED Provider Notes (Signed)
[facility] DEPT Provider Note   CSN: [id number] Arrival date & time: 07/22/17  1013     History   Chief Complaint Chief Complaint  Patient presents with  . Hyperglycemia    HPI Cassandra [name] is a 71 y.o. female.  The history is provided by the patient and medical records.  Hyperglycemia  Associated symptoms: nausea and vomiting      71 year old female with history of anemia, arthritis, hypertension, chronic kidney disease stage III, bipolar disorder, history of CHF with estimated EF of 65-70%, COPD, GERD, hyperlipidemia, presenting to the ED for hyperglycemia. Patient currently at [facility]. Staff there reports recently her sugar has been running in the 200s which is somewhat abnormal for her. Today he was in the 500s. She was given 10 units of short-acting insulin and some fluids but rechecked and it had not improved much so she was given 5 units of her long-acting insulin. Patient states she feels very nauseous at present.  She denies any abdominal pain, states she just feels "queasy". She has had some vomiting. He denies any chest pain or shortness of breath. No recent fever or chills.  Past Medical History:  Diagnosis Date  . Allergic rhinitis   . Anemia   . Anxiety   . Arthritis    "mostly feet, hands" (10/17/2016)  . Benign hypertension with CKD (chronic kidney disease) stage III   . Benign paroxysmal positional vertigo   . Bipolar disorder ([place])   . Broken finger   . Broken shoulder   . Broken toes   . Cervicalgia   . CHF (congestive heart failure) ([place]) 07/2016  . CKD (chronic kidney disease) stage 3, GFR 30-59 ml/min   . COPD (chronic obstructive pulmonary disease) ([place])   . Depression   . Elevated liver enzymes Hep B/C neg 2014  . Fall at nursing home 10/15/2016  . GERD (gastroesophageal reflux disease)   . Glaucoma   . High cholesterol   . History of alcohol use   . History of blood transfusion 09/2016   "blood got really really low"  .  Hypertension   . Hypothyroidism   . Interstitial cystitis    bladder stretched every 9 months  . Migraines    "pretty much qd" (10/17/2016)  . Pneumonia 12/2016  . Psoriasis   . Stroke ([place]) 05/16/2016   Left occipital and thalamic, right hippocampal  . Thyroid disease   . Tobacco use   . 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 Vitamin B12 deficiency     Patient Active Problem List   Diagnosis Date Noted  . Syncope 07/01/2017  . C. difficile colitis 07/01/2017  . Acute renal failure (ARF) ([place]) 06/14/2017  . Malnutrition of moderate degree 06/13/2017  . Acute renal failure ([place]) 06/13/2017  . CKD stage 4 due to type 2 diabetes mellitus ([place]) 06/12/2017  . Anasarca associated with disorder of kidney 06/12/2017  . Nephrotic syndrome due to type 2 diabetes mellitus ([place]) 06/12/2017  . Anasarca 05/09/2017  . Nausea vomiting and diarrhea 03/08/2017  . Constipation   . Generalized abdominal pain   . Fecal impaction ([place]) 01/04/2017  . HAP (hospital-acquired pneumonia) 12/23/2016  . HCAP (healthcare-associated pneumonia)   . Type 2 diabetes mellitus with hypoglycemia without coma ([place])   . Hypoglycemia 12/03/2016  . Palliative care encounter   . Goals of care, counseling/discussion   . Cervical vertebral fracture ([place]) 10/17/2016  . Thoracic vertebral fracture ([place]) 10/17/2016  . Fall at  nursing home 10/17/2016  . Common bile duct dilatation 10/17/2016  . Closed fracture of cervical vertebra ([place])   . Recent Gram-negative bacteremia 10/07/2016  . Hyperkalemia   . Gastroesophageal reflux disease   . DKA, type 1 ([place]) 09/06/2016  . Protein-calorie malnutrition, severe 08/28/2016  . AKI (acute kidney injury) ([place]) 07/19/2016  . Acute on chronic diastolic CHF (congestive heart failure) ([place]) 07/19/2016  . Dehydration   . Cerebral embolism with cerebral infarction 07/13/2016  . Left ventricular diastolic dysfunction, NYHA class 1 07/11/2016  . History of CVA  (cerebrovascular accident)   . HLD (hyperlipidemia)   . Anemia of chronic disease   . Physical deconditioning   . Acute on chronic kidney failure ([place])   . Diabetic ketoacidosis with coma associated with type 1 diabetes mellitus ([place])   . Cerebral thrombosis with cerebral infarction 05/15/2016  . Generalized anxiety disorder 05/10/2016  . Diabetic ketoacidosis without coma associated with type 1 diabetes mellitus ([place]) 05/06/2016  . Acute encephalopathy 05/06/2016  . Insulin dependent type 2 diabetes mellitus, uncontrolled ([place])   . Depression   . CKD (chronic kidney disease) stage 3, GFR 30-59 ml/min   . Uncontrolled Hypothyroidism   . Vitamin B12 deficiency   . Benign paroxysmal positional vertigo   . Anxiety   . Allergic rhinitis   . Glaucoma   . Benign hypertension with CKD (chronic kidney disease) stage III   . Tobacco abuse   . Cervicalgia   . Elevated liver enzymes   . History of alcohol use     Past Surgical History:  Procedure Laterality Date  . ANTERIOR CERVICAL DECOMP/DISCECTOMY FUSION  ~ 2009  . APPENDECTOMY    . BACK SURGERY    . BLADDER SUSPENSION    . CATARACT EXTRACTION W/ INTRAOCULAR LENS  IMPLANT, BILATERAL Bilateral   . DILATION AND CURETTAGE OF UTERUS    . HERNIA REPAIR    . LAPAROSCOPIC CHOLECYSTECTOMY    . TONSILLECTOMY    . TUBAL LIGATION    . VAGINAL HYSTERECTOMY     with oophorectomy    OB History    No data available       Home Medications    Prior to Admission medications   Medication Sig Start Date End Date Taking? Authorizing Provider  Amino Acids-Protein Hydrolys (FEEDING SUPPLEMENT, PRO-STAT SUGAR FREE 64,) LIQD Take 30 mLs by mouth 3 (three) times daily. 06/20/17   Johnson, Clanford L, MD  amLODipine (NORVASC) 5 MG tablet Take 1 tablet (5 mg total) by mouth daily. 05/15/17   Doreatha Lew, MD  atorvastatin (LIPITOR) 20 MG tablet Take 1 tablet (20 mg total) by mouth daily at 6 PM. 06/20/17   Murlean Iba, MD  blood glucose  meter kit and supplies Dispense based on patient and insurance preference. Use up to four times daily as directed. (FOR ICD-9 250.00, 250.01). 07/05/17   Dessa Phi Chahn-Yang, DO  carvedilol (COREG) 6.25 MG tablet Take 1 tablet (6.25 mg total) by mouth 2 (two) times daily with a meal. 06/20/17   Johnson, Clanford L, MD  diphenhydrAMINE (BENADRYL) 25 MG tablet Take 2 tablets (50 mg total) by mouth every 6 (six) hours as needed for itching (tremors). 06/21/17   Domenic Moras, PA-C  escitalopram (LEXAPRO) 20 MG tablet Take 1 tablet (20 mg total) by mouth every morning. 06/20/17   Johnson, Clanford L, MD  furosemide (LASIX) 20 MG tablet Take 4 tablets (80 mg total) by mouth 2 (two) times daily. 06/20/17  Johnson, Clanford L, MD  hydrochlorothiazide (HYDRODIURIL) 25 MG tablet Take 25 mg by mouth daily.    [provider]  Insulin Glargine (LANTUS) 100 UNIT/ML Solostar Pen Inject 7 Units into the skin daily. 07/05/17 08/17/17  Dessa Phi Chahn-Yang, DO  insulin lispro (HUMALOG) 100 UNIT/ML injection 2 units PLUS sliding scale, three times daily with meals CBG 121 - 150: 1 units CBG 151 - 200: 2 units CBG 201 - 250: 3 units CBG 251 - 300: 5 units CBG 301 - 350: 7 units CBG 351 - 400: 9 units  Sliding every night (qhs)   CBG < 70: implement hypoglycemia protocol  CBG 70 - 120: 0 units  CBG 121 - 200: 0 units  CBG 201 - 250: 2 units  CBG 251 - 300: 3 units  CBG 301 - 350: 4 units  CBG 351 - 400: 5 units 07/05/17   Dessa Phi Chahn-Yang, DO  Insulin Pen Needle 31G X 5 MM MISC Use daily with insulin, up to 4 times daily 07/05/17   Dessa Phi Chahn-Yang, DO  Insulin Syringe-Needle U-100 (INSULIN SYRINGE .3CC/31GX5/16") 31G X 5/16" 0.3 ML MISC Use daily with insulin 07/05/17   Dessa Phi Chahn-Yang, DO  iron polysaccharides (NIFEREX) 150 MG capsule Take 1 capsule (150 mg total) by mouth daily. 06/20/17   Johnson, Clanford L, MD  levothyroxine (SYNTHROID, LEVOTHROID) 175 MCG tablet Take 1  tablet (175 mcg total) by mouth daily before breakfast. 01/07/17   Hillary Bow, MD  losartan (COZAAR) 50 MG tablet Take 50 mg by mouth daily.    [provider]  metoCLOPramide (REGLAN) 5 MG tablet TK 1 T PO TID PRN FOR ABDOMINAL CRAMPING 05/14/17   [provider]  Nutritional Supplements (FEEDING SUPPLEMENT, NEPRO CARB STEADY,) LIQD Take 237 mLs by mouth at bedtime. 06/20/17   Johnson, Clanford L, MD  ondansetron (ZOFRAN) 4 MG tablet Take 4 mg by mouth every 8 (eight) hours as needed for nausea or vomiting.    [provider]  pantoprazole (PROTONIX) 40 MG tablet Take 40 mg by mouth daily.    [provider]  potassium chloride SA (K-DUR,KLOR-CON) 20 MEQ tablet Take 1 tablet (20 mEq total) by mouth daily. 06/20/17   Murlean Iba, MD    Family History Family History  Problem Relation Age of Onset  . Alcohol abuse Mother   . Arthritis Mother   . Asthma Mother   . Cancer Mother        colon cancer  . Hypertension Mother   . Migraines Mother   . Stroke Mother   . Lung disease Mother   . COPD Mother   . Diabetes Father   . Hypertension Father   . Heart disease Father   . Heart attack Father   . Heart disease Paternal Grandmother   . Diabetes Paternal Grandmother   . Stroke Paternal Grandmother   . Cancer Paternal Grandmother   . Diabetes Paternal Grandfather     Social History Social History  Substance Use Topics  . Smoking status: Former Smoker    Packs/day: 0.25    Years: 10.00    Types: Cigarettes    Quit date: 12/1923  . Smokeless tobacco: Never Used     Comment: QUIT SMOKING 11/2016  . Alcohol use No     Comment: Hasn't had any alcohol "for 2 yrs" (10/17/2016)     Allergies   Alprazolam; Percocet [oxycodone-acetaminophen]; Codeine; Doxycycline; Hydrocodone; Omnicef [cefdinir]; Tramadol; Augmentin [amoxicillin-pot clavulanate]; and Ciprofloxacin  Review of Systems Review of Systems  Gastrointestinal: Positive for nausea  and vomiting.  Endocrine:       Hyperglycemia  All other systems reviewed and are negative.    Physical Exam Updated Vital Signs BP 133/64   Pulse 72   Temp 98 F (36.7 C) (Oral)   Resp 18   Ht _0  (1.575 m)   Wt 47.6 kg (105 lb)   SpO2 100%   BMI 19.20 kg/m   Physical Exam  Constitutional: She is oriented to person, place, and time. She appears well-developed and well-nourished.  Appears older than stated age, generally weak  HENT:  Head: Normocephalic and atraumatic.  Mouth/Throat: Oropharynx is clear and moist.  Dry mucous membranes  Eyes: Pupils are equal, round, and reactive to light. Conjunctivae and EOM are normal.  Neck: Normal range of motion.  Cardiovascular: Normal rate, regular rhythm and normal heart sounds.   Pulmonary/Chest: Effort normal and breath sounds normal. No respiratory distress. She has no wheezes.  Abdominal: Soft. Bowel sounds are normal. There is no tenderness. There is no rigidity and no guarding.  Soft, benign  Musculoskeletal: Normal range of motion.  Neurological: She is alert and oriented to person, place, and time.  Skin: Skin is warm and dry.  Psychiatric: She has a normal mood and affect.  Nursing note and vitals reviewed.    ED Treatments / Results  Labs (all labs ordered are listed, but only abnormal results are displayed) Labs Reviewed  BASIC METABOLIC PANEL - Abnormal; Notable for the following:       Result Value   Sodium 134 (*)    Chloride 99 (*)    CO2 12 (*)    Glucose, Bld 519 (*)    BUN 113 (*)    Creatinine, Ser 4.11 (*)    Calcium 8.4 (*)    GFR calc non Af Amer 10 (*)    GFR calc Af Amer 12 (*)    Anion gap 23 (*)    All other components within normal limits  CBC - Abnormal; Notable for the following:    WBC 11.3 (*)    RBC 2.78 (*)    Hemoglobin 7.5 (*)    HCT 23.7 (*)    All other components within normal limits  CBG MONITORING, ED - Abnormal; Notable for the following:    Glucose-Capillary 516 (*)     All other components within normal limits  I-STAT VENOUS BLOOD GAS, ED - Abnormal; Notable for the following:    pH, Ven 7.208 (*)    pCO2, Ven 35.7 (*)    Bicarbonate 14.2 (*)    Acid-base deficit 13.0 (*)    All other components within normal limits  URINALYSIS, ROUTINE W REFLEX MICROSCOPIC  CBG MONITORING, ED    EKG  EKG Interpretation None       Radiology Dg Chest Port 1 View  Result Date: 07/22/2017 CLINICAL DATA:  Hyperglycemia. EXAM: PORTABLE CHEST 1 VIEW COMPARISON:  Chest x-ray dated June 21, 2017. FINDINGS: The cardiomediastinal silhouette is normal in size. Normal pulmonary vascularity. No focal consolidation, pleural effusion, or pneumothorax. No acute osseous abnormality. IMPRESSION: No active cardiopulmonary disease. Electronically Signed   By: Titus Dubin M.D.   On: 07/22/2017 11:05    Procedures Procedures (including critical care time)  CRITICAL CARE Performed by: Larene Pickett   Total critical care time: 45 minutes  Critical care time was exclusive of separately billable procedures and treating other patients.  Critical care  was necessary to treat or prevent imminent or life-threatening deterioration.  Critical care was time spent personally by me on the following activities: development of treatment plan with patient and/or surrogate as well as nursing, discussions with consultants, evaluation of patient's response to treatment, examination of patient, obtaining history from patient or surrogate, ordering and performing treatments and interventions, ordering and review of laboratory studies, ordering and review of radiographic studies, pulse oximetry and re-evaluation of patient's condition.   Medications Ordered in ED Medications  sodium chloride 0.9 % bolus 1,000 mL (1,000 mLs Intravenous New Bag/Given 07/22/17 1046)     Initial Impression / Assessment and Plan / ED Course  I have reviewed the triage vital signs and the nursing  notes.  Pertinent labs & imaging results that were available during my care of the patient were reviewed by me and considered in my medical decision making (see chart for details).  71 year old female here with hyperglycemia.  Sugars have been running in the 200s for the past 2 weeks, in the 500 range past 24 hours. Was given small doses of insulin prior to transport. On arrival to ED CBG is 516. Concern for DKA. Patient has been admitted multiple times in the past for same. Screening lab work will be obtained. IV fluid bolus started.  Patient's lab work consistent with DKA. Anion gap elevated at 23, bicarbonate 12, pH 7.2. Patient has received 2 L fluid bolus, will start on insulin drip. Patient will be admitted to medicine service for ongoing care.  Final Clinical Impressions(s) / ED Diagnoses   Final diagnoses:  Diabetic ketoacidosis without coma associated with type 2 diabetes mellitus ([place])  Non-intractable vomiting with nausea, unspecified vomiting type    New Prescriptions New Prescriptions   No medications on file     Kathryne Hitch 07/22/17 1220    LongWonda Olds, MD 07/22/17 [phone]

## 2017-07-23 ENCOUNTER — Encounter (HOSPITAL_COMMUNITY): Payer: Self-pay

## 2017-07-23 DIAGNOSIS — N184 Chronic kidney disease, stage 4 (severe): Secondary | ICD-10-CM

## 2017-07-23 DIAGNOSIS — G9341 Metabolic encephalopathy: Secondary | ICD-10-CM

## 2017-07-23 DIAGNOSIS — N179 Acute kidney failure, unspecified: Secondary | ICD-10-CM

## 2017-07-23 LAB — BASIC METABOLIC PANEL
ANION GAP: 12 (ref 5–15)
Anion gap: 12 (ref 5–15)
Anion gap: 13 (ref 5–15)
BUN: 85 mg/dL — AB (ref 6–20)
BUN: 86 mg/dL — ABNORMAL HIGH (ref 6–20)
BUN: 90 mg/dL — ABNORMAL HIGH (ref 6–20)
CALCIUM: 7.7 mg/dL — AB (ref 8.9–10.3)
CO2: 18 mmol/L — ABNORMAL LOW (ref 22–32)
CO2: 16 mmol/L — ABNORMAL LOW (ref 22–32)
CO2: 15 mmol/L — ABNORMAL LOW (ref 22–32)
CREATININE: 3.2 mg/dL — AB (ref 0.44–1.00)
CREATININE: 3.43 mg/dL — AB (ref 0.44–1.00)
Calcium: 7.7 mg/dL — ABNORMAL LOW (ref 8.9–10.3)
Calcium: 7.6 mg/dL — ABNORMAL LOW (ref 8.9–10.3)
Chloride: 107 mmol/L (ref 101–111)
Chloride: 106 mmol/L (ref 101–111)
Chloride: 108 mmol/L (ref 101–111)
Creatinine, Ser: 3.71 mg/dL — ABNORMAL HIGH (ref 0.44–1.00)
GFR calc Af Amer: 16 mL/min — ABNORMAL LOW (ref >60)
GFR calc Af Amer: 15 mL/min — ABNORMAL LOW (ref >60)
GFR calc Af Amer: 13 mL/min — ABNORMAL LOW (ref >60)
GFR, EST NON AFRICAN AMERICAN: 14 mL/min — AB (ref >60)
GFR, EST NON AFRICAN AMERICAN: 13 mL/min — AB (ref >60)
GFR, EST NON AFRICAN AMERICAN: 12 mL/min — AB (ref >60)
GLUCOSE: 433 mg/dL — AB (ref 65–99)
GLUCOSE: 167 mg/dL — AB (ref 65–99)
Glucose, Bld: 156 mg/dL — ABNORMAL HIGH (ref 65–99)
POTASSIUM: 4 mmol/L (ref 3.5–5.1)
Potassium: 4.2 mmol/L (ref 3.5–5.1)
Potassium: 5 mmol/L (ref 3.5–5.1)
SODIUM: 137 mmol/L (ref 135–145)
SODIUM: 135 mmol/L (ref 135–145)
SODIUM: 135 mmol/L (ref 135–145)

## 2017-07-23 LAB — CBC
HCT: 28.5 % — ABNORMAL LOW (ref 36.0–46.0)
Hemoglobin: 9.3 g/dL — ABNORMAL LOW (ref 12.0–15.0)
MCH: 27.5 pg (ref 26.0–34.0)
MCHC: 32.6 g/dL (ref 30.0–36.0)
MCV: 84.3 fL (ref 78.0–100.0)
PLATELETS: 292 10*3/uL (ref 150–400)
RBC: 3.38 MIL/uL — AB (ref 3.87–5.11)
RDW: 14.9 % (ref 11.5–15.5)
WBC: 11.8 10*3/uL — ABNORMAL HIGH (ref 4.0–10.5)

## 2017-07-23 LAB — MRSA PCR SCREENING: MRSA BY PCR: NEGATIVE

## 2017-07-23 LAB — GLUCOSE, CAPILLARY
GLUCOSE-CAPILLARY: 462 mg/dL — AB (ref 65–99)
GLUCOSE-CAPILLARY: 170 mg/dL — AB (ref 65–99)
GLUCOSE-CAPILLARY: 117 mg/dL — AB (ref 65–99)
Glucose-Capillary: 597 mg/dL (ref 65–99)
Glucose-Capillary: 39 mg/dL — CL (ref 65–99)
Glucose-Capillary: 44 mg/dL — CL (ref 65–99)
Glucose-Capillary: 48 mg/dL — ABNORMAL LOW (ref 65–99)

## 2017-07-23 LAB — HEMOGLOBIN A1C
Hgb A1c MFr Bld: 9.6 % — ABNORMAL HIGH (ref 4.8–5.6)
Mean Plasma Glucose: 229 mg/dL

## 2017-07-23 LAB — C DIFFICILE QUICK SCREEN W PCR REFLEX
C DIFFICLE (CDIFF) ANTIGEN: NEGATIVE
C Diff interpretation: NOT DETECTED
C Diff toxin: NEGATIVE

## 2017-07-23 MED ORDER — SODIUM BICARBONATE 650 MG PO TABS
1300.0000 mg | ORAL_TABLET | Freq: Two times a day (BID) | ORAL | Status: DC
  Administered 2017-07-23 – 2017-07-28 (×11): 1300 mg via ORAL
  Filled 2017-07-23 (×11): qty 2

## 2017-07-23 MED ORDER — LOPERAMIDE HCL 2 MG PO CAPS
2.0000 mg | ORAL_CAPSULE | Freq: Two times a day (BID) | ORAL | Status: DC | PRN
  Administered 2017-07-27 (×2): 2 mg via ORAL
  Filled 2017-07-23 (×2): qty 1

## 2017-07-23 MED ORDER — SACCHAROMYCES BOULARDII 250 MG PO CAPS
250.0000 mg | ORAL_CAPSULE | Freq: Two times a day (BID) | ORAL | Status: DC
  Administered 2017-07-23 – 2017-07-30 (×14): 250 mg via ORAL
  Filled 2017-07-23 (×15): qty 1

## 2017-07-23 MED ORDER — CARVEDILOL 6.25 MG PO TABS
6.2500 mg | ORAL_TABLET | Freq: Two times a day (BID) | ORAL | Status: DC
  Administered 2017-07-23 – 2017-07-30 (×15): 6.25 mg via ORAL
  Filled 2017-07-23 (×15): qty 1

## 2017-07-23 MED ORDER — DEXTROSE 50 % IV SOLN
INTRAVENOUS | Status: AC
  Administered 2017-07-23: 23:00:00
  Filled 2017-07-23: qty 50

## 2017-07-23 MED ORDER — FAMOTIDINE 20 MG PO TABS
20.0000 mg | ORAL_TABLET | Freq: Every day | ORAL | Status: DC
  Administered 2017-07-23 – 2017-07-30 (×8): 20 mg via ORAL
  Filled 2017-07-23 (×8): qty 1

## 2017-07-23 MED ORDER — SODIUM CHLORIDE 0.9 % IV SOLN
INTRAVENOUS | Status: DC
  Administered 2017-07-23: 13:00:00 via INTRAVENOUS

## 2017-07-23 MED ORDER — INSULIN GLARGINE 100 UNIT/ML ~~LOC~~ SOLN
7.0000 [IU] | Freq: Every day | SUBCUTANEOUS | Status: DC
  Administered 2017-07-23: 7 [IU] via SUBCUTANEOUS
  Filled 2017-07-23 (×2): qty 0.07

## 2017-07-23 MED ORDER — AMLODIPINE BESYLATE 5 MG PO TABS
5.0000 mg | ORAL_TABLET | Freq: Every day | ORAL | Status: DC
  Administered 2017-07-23 – 2017-07-26 (×4): 5 mg via ORAL
  Filled 2017-07-23 (×4): qty 1

## 2017-07-23 MED ORDER — INSULIN ASPART 100 UNIT/ML ~~LOC~~ SOLN
2.0000 [IU] | Freq: Three times a day (TID) | SUBCUTANEOUS | Status: DC
  Administered 2017-07-23 – 2017-07-24 (×4): 2 [IU] via SUBCUTANEOUS

## 2017-07-23 MED ORDER — GERHARDT'S BUTT CREAM
TOPICAL_CREAM | Freq: Three times a day (TID) | CUTANEOUS | Status: DC
  Administered 2017-07-23: 22:00:00 via TOPICAL
  Administered 2017-07-24: 1 via TOPICAL
  Administered 2017-07-24 – 2017-07-30 (×19): via TOPICAL
  Filled 2017-07-23 (×2): qty 1

## 2017-07-23 NOTE — NC FL2 (Signed)
[place] MEDICAID FL2 LEVEL OF CARE SCREENING TOOL     IDENTIFICATION  Patient Name: Cassandra [name] Birthdate: February 09, 1946 Sex: female Admission Date (Current Location): 07/22/2017  [facility] and [place] Number:  Producer, television/film/video and Address:  [facility], [address] [place], [place] [address]      Provider Number: [id number]  Attending Physician Name and Address:  Elease Etienne, MD  Relative Name and Phone Number:       Current Level of Care: Hospital Recommended Level of Care: Skilled Nursing Facility Prior Approval Number:    Date Approved/Denied:   PASRR Number: [id number]    Discharge Plan: SNF    Current Diagnoses: Patient Active Problem List   Diagnosis Date Noted  . DKA (diabetic ketoacidoses) (HCC) 07/22/2017  . Chronic diastolic CHF (congestive heart failure) (HCC) 07/22/2017  . Syncope 07/01/2017  . C. difficile colitis 07/01/2017  . Acute renal failure (ARF) (HCC) 06/14/2017  . Malnutrition of moderate degree 06/13/2017  . Acute renal failure (HCC) 06/13/2017  . CKD stage 4 due to type 2 diabetes mellitus (HCC) 06/12/2017  . Anasarca associated with disorder of kidney 06/12/2017  . Nephrotic syndrome due to type 2 diabetes mellitus (HCC) 06/12/2017  . Anasarca 05/09/2017  . Nausea vomiting and diarrhea 03/08/2017  . Constipation   . Abdominal pain   . Fecal impaction (HCC) 01/04/2017  . HAP (hospital-acquired pneumonia) 12/23/2016  . HCAP (healthcare-associated pneumonia)   . Type 2 diabetes mellitus with hypoglycemia without coma (HCC)   . Hypoglycemia 12/03/2016  . Palliative care encounter   . Goals of care, counseling/discussion   . Cervical vertebral fracture (HCC) 10/17/2016  . Thoracic vertebral fracture (HCC) 10/17/2016  . Fall at nursing home 10/17/2016  . Common bile duct dilatation 10/17/2016  . Closed fracture of cervical vertebra (HCC)   . Recent Gram-negative bacteremia 10/07/2016  .  Hyperkalemia   . Gastroesophageal reflux disease   . DKA, type 1 (HCC) 09/06/2016  . Protein-calorie malnutrition, severe 08/28/2016  . AKI (acute kidney injury) (HCC) 07/19/2016  . Acute on chronic diastolic CHF (congestive heart failure) (HCC) 07/19/2016  . Dehydration   . Cerebral embolism with cerebral infarction 07/13/2016  . Left ventricular diastolic dysfunction, NYHA class 1 07/11/2016  . History of CVA (cerebrovascular accident)   . Essential hypertension   . HLD (hyperlipidemia)   . Anemia of chronic disease   . Physical deconditioning   . Acute on chronic kidney failure (HCC)   . Diabetic ketoacidosis with coma associated with type 1 diabetes mellitus (HCC)   . Cerebral thrombosis with cerebral infarction 05/15/2016  . Generalized anxiety disorder 05/10/2016  . Diabetic ketoacidosis without coma associated with type 1 diabetes mellitus (HCC) 05/06/2016  . Acute encephalopathy 05/06/2016  . Insulin dependent type 2 diabetes mellitus, uncontrolled (HCC)   . Depression   . CKD (chronic kidney disease) stage 3, GFR 30-59 ml/min   . Uncontrolled Hypothyroidism   . Vitamin B12 deficiency   . Benign paroxysmal positional vertigo   . Anxiety   . Allergic rhinitis   . Glaucoma   . Benign hypertension with CKD (chronic kidney disease) stage III   . Tobacco abuse   . Cervicalgia   . Elevated liver enzymes   . History of alcohol use     Orientation RESPIRATION BLADDER Height & Weight     Self, Time, Situation, Place  Normal Incontinent Weight: 100 lb 8.5 oz (45.6 kg) Height:  5\' 2"  (157.5  cm)  BEHAVIORAL SYMPTOMS/MOOD NEUROLOGICAL BOWEL NUTRITION STATUS      Incontinent Diet (See DC summary)  AMBULATORY STATUS COMMUNICATION OF NEEDS Skin   Extensive Assist Verbally Normal                       Personal Care Assistance Level of Assistance  Bathing, Feeding, Dressing Bathing Assistance: Limited assistance Feeding assistance: Limited assistance Dressing Assistance:  Limited assistance     Functional Limitations Info  Sight, Hearing, Speech Sight Info: Adequate Hearing Info: Adequate Speech Info: Adequate    SPECIAL CARE FACTORS FREQUENCY                       Contractures Contractures Info: Not present    Additional Factors Info  Code Status, Allergies Code Status Info: Full Code  Allergies Info: Alprazolam, Percocet Oxycodone-acetaminophen, Codeine, Doxycycline, Hydrocodone, Omnicef Cefdinir, Tramadol, Augmentin Amoxicillin-pot Clavulanate, Ciprofloxacin     Isolation Precautions Info: none, not on contact     Current Medications (07/23/2017):  This is the current hospital active medication list Current Facility-Administered Medications  Medication Dose Route Frequency Provider Last Rate Last Dose  . 0.9 %  sodium chloride infusion   Intravenous Continuous Elease Etienne, MD 50 mL/hr at 07/23/17 1246    . amLODipine (NORVASC) tablet 5 mg  5 mg Oral Daily Elease Etienne, MD   5 mg at 07/23/17 1350  . atorvastatin (LIPITOR) tablet 20 mg  20 mg Oral q1800 Ozella Rocks, MD   20 mg at 07/23/17 1750  . carvedilol (COREG) tablet 6.25 mg  6.25 mg Oral BID WC Hongalgi, Anand D, MD   6.25 mg at 07/23/17 1750  . escitalopram (LEXAPRO) tablet 20 mg  20 mg Oral Gwynneth Munson, MD   20 mg at 07/23/17 0725  . famotidine (PEPCID) tablet 20 mg  20 mg Oral Daily Elease Etienne, MD   20 mg at 07/23/17 1350  . feeding supplement (PRO-STAT SUGAR FREE 64) liquid 30 mL  30 mL Oral TID Ozella Rocks, MD      . Gerhardt's butt cream   Topical TID Elease Etienne, MD      . heparin injection 5,000 Units  5,000 Units Subcutaneous Q8H Ozella Rocks, MD   5,000 Units at 07/23/17 1350  . hydrALAZINE (APRESOLINE) injection 5-10 mg  5-10 mg Intravenous Q4H PRN Ozella Rocks, MD   5 mg at 07/23/17 0902  . insulin aspart (novoLOG) injection 0-5 Units  0-5 Units Subcutaneous QHS Ozella Rocks, MD      . insulin aspart (novoLOG)  injection 0-9 Units  0-9 Units Subcutaneous TID WC Ozella Rocks, MD   2 Units at 07/23/17 1746  . insulin aspart (novoLOG) injection 2 Units  2 Units Subcutaneous TID WC Elease Etienne, MD   2 Units at 07/23/17 1746  . insulin glargine (LANTUS) injection 7 Units  7 Units Subcutaneous Daily Elease Etienne, MD   7 Units at 07/23/17 [phone]  . iron polysaccharides (NIFEREX) capsule 150 mg  150 mg Oral Daily Ozella Rocks, MD   150 mg at 07/23/17 0856  . levothyroxine (SYNTHROID, LEVOTHROID) tablet 175 mcg  175 mcg Oral QAC breakfast Ozella Rocks, MD   175 mcg at 07/23/17 0855  . loperamide (IMODIUM) capsule 2 mg  2 mg Oral BID PRN Hongalgi, Maximino Greenland, MD      . metoCLOPramide (REGLAN) tablet 5 mg  5 mg Oral TID PRN Ozella RocksMerrell, David J, MD   5 mg at 07/23/17 0313  . saccharomyces boulardii (FLORASTOR) capsule 250 mg  250 mg Oral BID Hongalgi, Anand D, MD      . sodium bicarbonate tablet 1,300 mg  1,300 mg Oral BID Elease EtienneHongalgi, Anand D, MD   1,300 mg at 07/23/17 1242     Discharge Medications: Please see discharge summary for a list of discharge medications.  Relevant Imaging Results:  Relevant Lab Results:   Additional Information [id number]   Hospice referral at discharge, Hospice to follow at the facility.   Raye Sorrowoble, Alexzia Kasler N, LCSW

## 2017-07-23 NOTE — Progress Notes (Signed)
Long conversation w/daughters, Marshell LevanKeeley and [place]ideNicole.  Daughters request call from CSW to discuss Hospice Services post-discharge; unsure of discharge disposition.  Report distance from patient's husband, their step-father.  Concerns expressed regarding blood sugars and kidney function.  Daughters would like a call from physician as well to discuss medical status.  Patient verbally approves for all staff to discuss her care w/daughters.

## 2017-07-23 NOTE — Progress Notes (Signed)
PROGRESS NOTE   Cassandra [name]  UJW:[id number]    DOB: 07/07/1946    DOA: 07/22/2017  PCP: Henrine Screws, MD   I have briefly reviewed patients previous medical records in [facility].  Brief Narrative:  71 year old female with PMH of DM 2/IDDM, recurrent episodes of DKA, brittle DM, stage IV chronic kidney disease, nephrotic syndrome, HTN, HLD, chronic anemia, C. difficile colitis, multiple recent hospitalizations (5 in the last 6 months), most recently 7/17-7/21 when she presented with syncope within 12 hours after discharge from SNF and found to be hypoglycemic with blood sugars in the 30s, sent to ED from SNF on 07/22/17 due to CBGs in the 200s over the last couple of weeks which increased to 500's on day of admission, did not respond adequately to insulin's and fluids, abdominal discomfort, nausea and ongoing diarrhea, noted to be in DKA and admitted to stepdown on insulin drip protocol, became hypoglycemic same night, insulin drip discontinued. Nephrology consulted 8/8 to assist with CKD   Assessment & Plan:   Active Problems:   Acute encephalopathy   Essential hypertension   HLD (hyperlipidemia)   Acute on chronic kidney failure (HCC)   Abdominal pain   DKA (diabetic ketoacidoses) (HCC)   Chronic diastolic CHF (congestive heart failure) (HCC)   1. DKA in type II DM, not at goal/recurrent DKA/brittle DM: Presented with glucose 519, bicarbonate 12, anion gap 23. Placed initially on insulin drip per protocol. Became hypoglycemic on the same night of admission and insulin drip was discontinued without starting Lantus. Blood glucose this morning 433. Anion gap has closed: 13. Bicarbonate 16, likely related to chronic kidney disease. Unable to give significant IV fluids due to chronic kidney disease. Resume home Lantus 17 units daily, NovoLog sensitive SSI with bedtime scale. May consider adding mealtime NovoLog depending on oral intake. Monitor closely and if CBGs don't start  improving, may have to return to IV insulin drip. May be having hypoglycemic episodes related to her chronic kidney disease and poor oral intake. Reported issues with poor compliance with insulin's at home. A1c: 9.6. 2. Acute on Stage IV chronic kidney disease: Baseline creatinine probably in the 2.7-2.9 range. Creatinine at recent discharge on 7/21:3.2. Presented with creatinine of 4.11. Acute kidney injury likely related to dehydration from poor oral intake, GI losses and hyperglycemia. Brief and gentle IV fluids. Creatinine has improved to 3.43. Follow BMP in a.m. Seen by nephrology during recent admission but likely did not keep up outpatient follow-up on 8/1. Nephrology consulted. 3. Non-anion gap metabolic acidosis: Likely related to chronic kidney disease. Nephrology consulted and started sodium bicarbonate 1300 MG twice a day. Follow BMP. 4. Acute toxic metabolic encephalopathy: Likely secondary to multiple acute metabolic abnormalities. No clear infectious etiology. Abdominal and chest x-ray without acute findings. Seems to have improved. Monitor. 5. Abdominal pain/diarrhea: Recent C. difficile colitis. Repeat C. difficile testing negative. KUB negative for acute findings.? Diabetic gastroparesis. Supportive treatment. Add probiotics. 6. Essential hypertension: Mildly uncontrolled. Resume amlodipine, carvedilol. Hold Lasix, HCTZ and Cozaar. Monitor. 7. Hyperlipidemia: Continue statins. 8. Hypothyroid: Continue levothyroxine. 9. Depression: Stable. Continue Lexapro. 10. Chronic diastolic CHF: Currently on the dry side. Monitor while on IV fluids. 11. GERD: Holding PPI. Consider Pepcid. 12. Normocytic anemia: Likely related to chronic kidney disease. Stable. 13. Adult failure to thrive: Likely multifactorial.   DVT prophylaxis: Subcutaneous heparin Code Status: Full Family Communication: None at bedside Disposition: Admitted to stepdown unit. Continue management in stepdown unit for  additional 24 hours. Possible  DC back to SNF when medically improved.   Consultants:  Nephrology   Procedures:  None  Antimicrobials:  None    Subjective: Seen this morning. Reported ongoing abdominal pain but no BM or vomiting since admission.   ROS: Denies chest pain, dyspnea, dizziness or lightheadedness. No bleeding reported.  Objective:  Vitals:   07/23/17 0313 07/23/17 0332 07/23/17 0837 07/23/17 0902  BP: (!) 163/79  139/77 (!) 174/76  Pulse: 88  87   Resp: 15  16   Temp: 99 F (37.2 C)  99.6 F (37.6 C)   TempSrc: Oral  Oral   SpO2: 98%  98%   Weight:  45.6 kg (100 lb 8.5 oz)    Height:        Examination:  General exam: Pleasant middle-aged female, small built, frail, chronically ill looking, lying comfortably supine in bed. Oral mucosa dry. Respiratory system: Clear to auscultation. Respiratory effort normal. Cardiovascular system: S1 & S2 heard, RRR. No JVD, murmurs, rubs, gallops or clicks. No pedal edema. Telemetry: Sinus rhythm Gastrointestinal system: Abdomen is nondistended, soft and nontender. No organomegaly or masses felt. Normal bowel sounds heard. Central nervous system: Alert and oriented 2. No focal neurological deficits. Extremities: Symmetric 5 x 5 power. Skin: No rashes, lesions or ulcers Psychiatry: Judgement and insight impaired. Mood & affect flat.     Data Reviewed: I have personally reviewed following labs and imaging studies  CBC:  Recent Labs Lab 07/22/17 1041 07/23/17 0510  WBC 11.3* 11.8*  HGB 7.5* 9.3*  HCT 23.7* 28.5*  MCV 85.3 84.3  PLT 397 292   Basic Metabolic Panel:  Recent Labs Lab 07/22/17 1041 07/22/17 1721 07/22/17 2138 07/23/17 0105 07/23/17 0510  NA 134* 139 134* 137 135  K 3.7 3.7 4.9 4.2 5.0  CL 99* 106 107 107 106  CO2 12* 22 16* 18* 16*  GLUCOSE 519* 51* 67 156* 433*  BUN 113* 95* 94* 85* 86*  CREATININE 4.11* 3.18* 3.22* 3.20* 3.43*  CALCIUM 8.4* 7.7* 7.5* 7.7* 7.7*  MG  --  1.9  --   --    --   PHOS  --  6.5*  --   --   --    Cardiac Enzymes:  Recent Labs Lab 07/22/17 1721  TROPONINI <0.03   HbA1C:  Recent Labs  07/22/17 1721  HGBA1C 9.6*   CBG:  Recent Labs Lab 07/22/17 1746 07/22/17 1804 07/22/17 1847 07/22/17 2129 07/23/17 0841  GLUCAP 45* 90 108* 97 597*    Recent Results (from the past 240 hour(s))  MRSA PCR Screening     Status: None   Collection Time: 07/22/17  2:44 AM  Result Value Ref Range Status   MRSA by PCR NEGATIVE NEGATIVE Final    Comment:        The GeneXpert MRSA Assay (FDA approved for NASAL specimens only), is one component of a comprehensive MRSA colonization surveillance program. It is not intended to diagnose MRSA infection nor to guide or monitor treatment for MRSA infections.   C difficile quick scan w PCR reflex     Status: None   Collection Time: 07/22/17  9:59 PM  Result Value Ref Range Status   C Diff antigen NEGATIVE NEGATIVE Final   C Diff toxin NEGATIVE NEGATIVE Final   C Diff interpretation No C. difficile detected.  Final         Radiology Studies: Abd 1 View (kub)  Result Date: 07/22/2017 CLINICAL DATA:  Hyperglycemia, abdominal pain, nausea, history  type I diabetes mellitus, hypertension, CHF, chronic kidney disease stage III EXAM: ABDOMEN - 1 VIEW COMPARISON:  06/21/2017 FINDINGS: Surgical clips RIGHT upper quadrant likely representing prior cholecystectomy. Nonobstructive bowel gas pattern. No bowel dilatation or bowel wall thickening. Scattered vascular calcifications in pelvis. No definite urinary tract calcification. Bones demineralized. IMPRESSION: No acute abnormalities. Electronically Signed   By: Ulyses Southward M.D.   On: 07/22/2017 14:55   Dg Chest Port 1 View  Result Date: 07/22/2017 CLINICAL DATA:  Hyperglycemia. EXAM: PORTABLE CHEST 1 VIEW COMPARISON:  Chest x-ray dated June 21, 2017. FINDINGS: The cardiomediastinal silhouette is normal in size. Normal pulmonary vascularity. No focal  consolidation, pleural effusion, or pneumothorax. No acute osseous abnormality. IMPRESSION: No active cardiopulmonary disease. Electronically Signed   By: Obie Dredge M.D.   On: 07/22/2017 11:05        Scheduled Meds: . atorvastatin  20 mg Oral q1800  . escitalopram  20 mg Oral BH-q7a  . feeding supplement (PRO-STAT SUGAR FREE 64)  30 mL Oral TID  . heparin  5,000 Units Subcutaneous Q8H  . insulin aspart  0-5 Units Subcutaneous QHS  . insulin aspart  0-9 Units Subcutaneous TID WC  . insulin glargine  7 Units Subcutaneous Daily  . iron polysaccharides  150 mg Oral Daily  . levothyroxine  175 mcg Oral QAC breakfast  . sodium bicarbonate  1,300 mg Oral BID   Continuous Infusions:   LOS: 1 day     Ginia Rudell, MD, FACP, FHM. [facility] Pager [phone]  If 7PM-7AM, please contact night-coverage [email or web address] Password [email or web address] 07/23/2017, 11:19 AM

## 2017-07-23 NOTE — Progress Notes (Signed)
Blood glucose 433; Gap 13 this AM per labs.  Dr. Al CorpusHongali paged.  New order received for Novolog 9units subq at this time.  Dr. Al CorpusHongali states he will assess need for Lantus and encourages continued monitoring of blood sugars as this patient is considered brittle.

## 2017-07-23 NOTE — Consult Note (Signed)
[facility] Consultation Note Requesting Physician:  Algis Liming, MD Primary Nephrologist: No established, has been referred many times Reason for Consult:  Acute on chronic renal failure  HPI: This is a 71 y.o. F living in [facility] with Hx of failure to thrive, brittle DM-1, CKD (recent BL Cr~3), nephrotic syndrome (Pr-Cr ratio 16 04/2017), HTN, uncontrolled hypothyroidism, hx of C. Diff colitis, bipolar disorder and hx of CVA admitted 07/22/17 with DKA. Per SNF, she had been hyperglycemic to 200's for several weeks with acute exacerbation to 500's the day of admission. On Arrival to ED she was found to be in DKA with glucose of 519, bicarb of 12 and anion gap of 23. She was subsequently started on glucostabilizer protocol however IVF administration was limited due to her advanced CKD. No specific trigger was found. CXR and KUB were without acute process, UA yet to be collected. C. Diff toxin was negative as patient did complain of abdominal pain, several week history of diarrhea. The patient has had repeated admissions over the past several years due to failure to thrive, inability to care for herself, medication non-compliance and unsafe living conditions. She had 11 admissions in 2017, this is her 6th admission for 2018.  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 Course of progression punctuated by repeated episodes of AKI on CKD but from creatinine trending baseline clearly worsening past 6 months. She has been seen by nephrology several times as an inpatient however does not follow-up with her scheduled outpatient appointments, including her most recent appointment 8/1 with Dr. Justin Mend. She was admitted 06/2017 for nephrotic syndrome and subsequent volume overload which responded well to IV lasix. Discharged to SNF however palliative care services were recommended if she continued to have failure to thrive.     07/22/2017 10:41 07/22/2017 17:21 07/22/2017 21:38  07/23/2017 01:05 07/23/2017 05:10 07/23/2017 16:37  Creatinine 4.11 (H) 3.18 (H) 3.22 (H) 3.20 (H) 3.43 (H) 3.71 (H)   Past Medical History:  Diagnosis Date  . Allergic rhinitis   . Anemia   . Anxiety   . Arthritis    "mostly feet, hands" (10/17/2016)  . Benign hypertension with CKD (chronic kidney disease) stage III   . Benign paroxysmal positional vertigo   . Bipolar disorder ([place])   . Broken finger   . Broken shoulder   . Broken toes   . Cervicalgia   . CHF (congestive heart failure) ([place]) 07/2016  . CKD (chronic kidney disease) stage 3, GFR 30-59 ml/min   . COPD (chronic obstructive pulmonary disease) ([place])   . Depression   . Elevated liver enzymes Hep B/C neg 2014  . Fall at nursing home 10/15/2016  . GERD (gastroesophageal reflux disease)   . Glaucoma   . High cholesterol   . History of alcohol use   . History of blood transfusion 09/2016   "blood got really really low"  . Hypertension   . Hypothyroidism   . Interstitial cystitis    bladder stretched every 9 months  . Migraines    "pretty much qd" (10/17/2016)  . Pneumonia 12/2016  . Psoriasis   . Stroke ([place]) 05/16/2016   Left occipital and thalamic, right hippocampal  . Thyroid disease   . Tobacco use   . Type 1 diabetes mellitus with renal complications ([place])   . Vitamin B12 deficiency     Past Surgical History:  Procedure Laterality Date  . ANTERIOR CERVICAL DECOMP/DISCECTOMY FUSION  ~ 2009  . APPENDECTOMY    .  BACK SURGERY    . BLADDER SUSPENSION    . CATARACT EXTRACTION W/ INTRAOCULAR LENS  IMPLANT, BILATERAL Bilateral   . DILATION AND CURETTAGE OF UTERUS    . HERNIA REPAIR    . LAPAROSCOPIC CHOLECYSTECTOMY    . TONSILLECTOMY    . TUBAL LIGATION    . VAGINAL HYSTERECTOMY     with oophorectomy   Family History  Problem Relation Age of Onset  . Alcohol abuse Mother   . Arthritis Mother   . Asthma Mother   . Cancer Mother        colon cancer  . Hypertension Mother   . Migraines Mother   . Stroke  Mother   . Lung disease Mother   . COPD Mother   . Diabetes Father   . Hypertension Father   . Heart disease Father   . Heart attack Father   . Heart disease Paternal Grandmother   . Diabetes Paternal Grandmother   . Stroke Paternal Grandmother   . Cancer Paternal Grandmother   . Diabetes Paternal Grandfather    Social History: Former smoker with 10 pack year history, quit in 2017. Former alcoholic but quit 2 years ago. Pt grew up in [place], went to [employer] then [employer], graduated 12th grade. Had two kids, one does insurance in [place] and the other sales in [place]. Currently resides at [facility] (pt herself tells [place] she thinks she lives at home)  Allergies  Allergen Reactions  . Alprazolam Other (See Comments)    Family preference, for patient to not take med  . Percocet [Oxycodone-Acetaminophen] Other (See Comments)    Family preference, for patient to not take med  . Codeine Diarrhea and Nausea And Vomiting  . Doxycycline Diarrhea and Nausea And Vomiting  . Hydrocodone Nausea And Vomiting  . Omnicef [Cefdinir] Nausea Only and Other (See Comments)    Constipation, tolerated Zosyn  . Tramadol Other (See Comments)    Pt hallucinates when taking medication. Family request not to give medication to pt   . Augmentin [Amoxicillin-Pot Clavulanate] Hives and Rash    Has patient had a PCN reaction causing immediate rash, facial/tongue/throat swelling, SOB or lightheadedness with hypotension: Yes Has patient had a PCN reaction causing severe rash involving mucus membranes or skin necrosis: Yes Did a PCN reaction that required hospitalization No Did PCN reaction occurring within the last 10 years: Yes If all of the above answers are "NO", then may proceed with Cephalosporin use.  Pt states she has taken penicillin since, and was ok with it...  . Ciprofloxacin Hives    Tolerated LVQ in 12/2016    Prior to Admission medications   Medication Sig Start Date End Date Taking?  Authorizing Provider  Amino Acids-Protein Hydrolys (FEEDING SUPPLEMENT, PRO-STAT SUGAR FREE 64,) LIQD Take 30 mLs by mouth 3 (three) times daily. 06/20/17  Yes Johnson, Clanford L, MD  amLODipine (NORVASC) 5 MG tablet Take 1 tablet (5 mg total) by mouth daily. 05/15/17  Yes Patrecia Pour, Christean Grief, MD  atorvastatin (LIPITOR) 20 MG tablet Take 1 tablet (20 mg total) by mouth daily at 6 PM. 06/20/17  Yes Johnson, Clanford L, MD  blood glucose meter kit and supplies Dispense based on patient and insurance preference. Use up to four times daily as directed. (FOR ICD-9 250.00, 250.01). 07/05/17  Yes Dessa Phi Chahn-Yang, DO  carvedilol (COREG) 6.25 MG tablet Take 1 tablet (6.25 mg total) by mouth 2 (two) times daily with a meal. 06/20/17  Yes Wynetta Emery,  Clanford L, MD  diphenhydrAMINE (BENADRYL) 25 MG tablet Take 2 tablets (50 mg total) by mouth every 6 (six) hours as needed for itching (tremors). 06/21/17  Yes Domenic Moras, PA-C  escitalopram (LEXAPRO) 20 MG tablet Take 1 tablet (20 mg total) by mouth every morning. 06/20/17  Yes Johnson, Clanford L, MD  furosemide (LASIX) 20 MG tablet Take 4 tablets (80 mg total) by mouth 2 (two) times daily. Patient taking differently: Take 40 mg by mouth 2 (two) times daily.  06/20/17  Yes Johnson, Clanford L, MD  hydrochlorothiazide (HYDRODIURIL) 25 MG tablet Take 25 mg by mouth daily.   Yes [provider]  Insulin Glargine (LANTUS) 100 UNIT/ML Solostar Pen Inject 7 Units into the skin daily. 07/05/17 08/17/17 Yes Choi, Anderson [place] Chahn-Yang, DO  insulin lispro (HUMALOG) 100 UNIT/ML injection 2 units PLUS sliding scale, three times daily with meals CBG 121 - 150: 1 units CBG 151 - 200: 2 units CBG 201 - 250: 3 units CBG 251 - 300: 5 units CBG 301 - 350: 7 units CBG 351 - 400: 9 units  Sliding every night (qhs)   CBG < 70: implement hypoglycemia protocol  CBG 70 - 120: 0 units  CBG 121 - 200: 0 units  CBG 201 - 250: 2 units  CBG 251 - 300: 3 units  CBG 301 - 350: 4  units  CBG 351 - 400: 5 units Patient taking differently: Inject 2 Units into the skin See admin instructions. 2 units with meals plus 0-69=initiate hypoglycemic protocol, 70-200=0 units, 201-250=2 units, 251-300=3 units, 301-350=4 units, 351-400=5 units, >400 notify MD 07/05/17  Yes Dessa Phi Chahn-Yang, DO  Insulin Pen Needle 31G X 5 MM MISC Use daily with insulin, up to 4 times daily 07/05/17  Yes Dessa Phi Chahn-Yang, DO  Insulin Syringe-Needle U-100 (INSULIN SYRINGE .3CC/31GX5/16") 31G X 5/16" 0.3 ML MISC Use daily with insulin 07/05/17  Yes Dessa Phi Chahn-Yang, DO  iron polysaccharides (NIFEREX) 150 MG capsule Take 1 capsule (150 mg total) by mouth daily. 06/20/17  Yes Johnson, Clanford L, MD  levothyroxine (SYNTHROID, LEVOTHROID) 175 MCG tablet Take 1 tablet (175 mcg total) by mouth daily before breakfast. 01/07/17  Yes Sudini, Alveta Heimlich, MD  loperamide (IMODIUM) 2 MG capsule Take 4 mg by mouth 2 (two) times daily as needed for diarrhea or loose stools.   Yes [provider]  losartan (COZAAR) 50 MG tablet Take 50 mg by mouth daily.   Yes [provider]  metoCLOPramide (REGLAN) 5 MG tablet TK 1 T PO TID PRN FOR ABDOMINAL CRAMPING 05/14/17  Yes [provider]  Nutritional Supplements (FEEDING SUPPLEMENT, NEPRO CARB STEADY,) LIQD Take 237 mLs by mouth at bedtime. 06/20/17  Yes Johnson, Clanford L, MD  ondansetron (ZOFRAN) 4 MG tablet Take 4 mg by mouth every 8 (eight) hours as needed for nausea or vomiting.   Yes [provider]  pantoprazole (PROTONIX) 40 MG tablet Take 40 mg by mouth daily.   Yes [provider]  potassium chloride SA (K-DUR,KLOR-CON) 20 MEQ tablet Take 1 tablet (20 mEq total) by mouth daily. 06/20/17  Yes Johnson, Clanford L, MD  triamcinolone (KENALOG) 0.025 % cream Apply 1 application topically 2 (two) times daily.   Yes [provider]   Inpatient medications: . atorvastatin  20 mg Oral q1800  . escitalopram  20  mg Oral BH-q7a  . feeding supplement (PRO-STAT SUGAR FREE 64)  30 mL Oral TID  . heparin  5,000 Units Subcutaneous Q8H  .  insulin aspart  0-5 Units Subcutaneous QHS  . insulin aspart  0-9 Units Subcutaneous TID WC  . insulin glargine  7 Units Subcutaneous Daily  . iron polysaccharides  150 mg Oral Daily  . levothyroxine  175 mcg Oral QAC breakfast  . sodium bicarbonate  1,300 mg Oral BID   Physical Exam:  Blood pressure (!) 174/76, pulse 87, temperature 99.6 F (37.6 C), temperature source Oral, resp. rate 16, height 5' 2" (1.575 m), weight 100 lb 8.5 oz (45.6 kg), SpO2 98 %.  Gen: Frail and cachectic appearing woman.  Appears older than stated age.  Laying comfortably supine however gown wet with spilled drink.  Mucous membranes dry. Skin: Pale. Thin, poor turgor. No cyanosis.  Chest: RRR, no MGR.  Abdomen: soft, not distended. Diffuse tenderness. +bs Thin extremities with poor muscle mass.  No peripheral edema Neuro: Awake but oriented to place, city and name. Did not know year or month. Unsure why she is hospitalized.  When I saw her with Dr. Danford Bad later in afternoon knew hospital but not which one, not day, month, year, can't tell me her children's ages, thinks she was living at home most recently ([place])    Recent Labs Lab 07/22/17 1041 07/22/17 1721 07/22/17 2138 07/23/17 0105 07/23/17 0510  NA 134* 139 134* 137 135  K 3.7 3.7 4.9 4.2 5.0  CL 99* 106 107 107 106  CO2 12* 22 16* 18* 16*  GLUCOSE 519* 51* 67 156* 433*  BUN 113* 95* 94* 85* 86*  CREATININE 4.11* 3.18* 3.22* 3.20* 3.43*  CALCIUM 8.4* 7.7* 7.5* 7.7* 7.7*  PHOS  --  6.5*  --   --   --   CBC:  Recent Labs Lab 07/22/17 1041 07/23/17 0510  WBC 11.3* 11.8*  HGB 7.5* 9.3*  HCT 23.7* 28.5*  MCV 85.3 84.3  PLT 397 292     Recent Labs Lab 07/22/17 1746 07/22/17 1804 07/22/17 1847 07/22/17 2129 07/23/17 0841  GLUCAP 45* 90 108* 97 597*   Iron Studies:  Lab Results  Component Value Date    IRON 50 07/02/2017   TIBC 204 (L) 07/02/2017   FERRITIN 133 07/02/2017   Xrays/Other Studies: Abd 1 View (kub)  Result Date: 07/22/2017 CLINICAL DATA:  Hyperglycemia, abdominal pain, nausea, history type I diabetes mellitus, hypertension, CHF, chronic kidney disease stage III EXAM: ABDOMEN - 1 VIEW COMPARISON:  06/21/2017 FINDINGS: Surgical clips RIGHT upper quadrant likely representing prior cholecystectomy. Nonobstructive bowel gas pattern. No bowel dilatation or bowel wall thickening. Scattered vascular calcifications in pelvis. No definite urinary tract calcification. Bones demineralized. IMPRESSION: No acute abnormalities. Electronically Signed   By: Lavonia Dana M.D.   On: 07/22/2017 14:55   Dg Chest Port 1 View  Result Date: 07/22/2017 CLINICAL DATA:  Hyperglycemia. EXAM: PORTABLE CHEST 1 VIEW COMPARISON:  Chest x-ray dated June 21, 2017. FINDINGS: The cardiomediastinal silhouette is normal in size. Normal pulmonary vascularity. No focal consolidation, pleural effusion, or pneumothorax. No acute osseous abnormality. IMPRESSION: No active cardiopulmonary disease. Electronically Signed   By: Titus Dubin M.D.   On: 07/22/2017 11:05   Background: Frail 71 y/o F with advanced CKD stage 4, nephrotic syndrome, poorly controlled type 1 diabetes, poorly controlled hypertension, failure to thrive and bipolar disorder here with DKA. She has had many admissions over the past several years for failure to thrive vs medication non-compliance. She has been seen by nephrology as an ou several times however continues to miss outpatient follow-ups. Family considering hospice  services after discharge.   Assessment/Plan:   Acute on Chronic Renal Failure, Nephrotic Syndrome:  Felt to be secondary to poorly controlled diabetes and hypertension compounded by frequent AKI on CKD.  She has extreme proteinuria with hypoalbuminemia.  She is not volume overloaded, in fact her previous dry weight was felt to be  ~115lbs, she is currently 100lbs.  She is quite fatigued on examination with mental slowing, likely related to her renal disease as well as some degree of dementia.  This patient is not an acceptable candidate for RRT in the future given her frailty, lack of motivation to follow-up with outpatient specialists, significant co-morbidities and failure to thrive. RRT would likely provide little improvement in her quality of life, and in fact, may worsen.  She is heading towards ESRD and a palliative approach should be pursued if and when she develops end-stage renal failure.  Appears daughters are appropriately discussing hospice services for after discharge. Additional [place] discussions are needed.  -Palliative approach most appropriate as patient approaches ESRD -Additional GOC discussion required.   Hyperphosphatemia Phosphorus 6.5 on admission.  Corrected calcium normal.  Little benefit of checking PTH at this time.   Normocytic Anemia:  Hb 7.5 on admission.  Recent iron studies with Iron of 50, TIBC 204, sat ratio 24 and ferritin 133.   DKA in Brittle Type 1 Diabetic Anion gap has closed and she is no longer on insulin drip.  Home lantus has been resumed.  Patient has had extremely variable CBGs since admission.  HbA1c 9.6% this admission.   Failure to Thrive in an Adult: Multifactorial in nature. She has significant psychiatric history likely contributing in addition to her other co-morbidities including uncontrolled diabetes, HTN, advanced CKD and poorly controlled hypothyroidism.   Essential HTN:  Hypertensive on Amlodipine and carvedilol.  Home lasix, HCTZ and cozaar were held on admission.   Non-anion gap metabolic acidosis:  Initial gap closed (DKA) but persistently low bicarb  (CKD + recent 1-2 weeks of diarrhea) Primary started bicarb replacement with 1300 mg BID.   Einar Gip, DO Internal Medicine - PGY2 07/23/2017, 10:37 AM   Agree with excellent and comprehensive note  from Dr. Danford Bad above. Pt with progressive CKD 2/2 DM, admitted with DKA; gap closed with insulin. Persistent non-gap acidosis with 1-2 weeks diarrhea + CKD4. Po bicarb appropriately added. Creatinine has improved a bit since admission. On review of the big picture re her CKD, she will progress to ESRD but is not an acceptable candidate for RRT in the future given her frailty, lack of motivation to follow-up with outpatient specialists, significant co-morbidities and failure to thrive. RRT would likely provide little improvement in her quality of life, and in fact, may worsen. She is heading towards ESRD eventually and a palliative approach should be pursued if and when she develops end-stage renal failure.  Jamal Maes, MD [facility] [phone] Pager 07/23/2017, 6:17 PM

## 2017-07-23 NOTE — Progress Notes (Signed)
Inpatient Diabetes Program Recommendations  AACE/ADA: New Consensus Statement on Inpatient Glycemic Control (2015)  Target Ranges:  Prepandial:   less than 140 mg/dL      Peak postprandial:   less than 180 mg/dL (1-2 hours)      Critically ill patients:  140 - 180 mg/dL   Results for Cassandra [name], Cassandra [name] (MRN [id number]) as of 07/23/2017 09:32  Ref. Range 07/22/2017 10:41  Sodium Latest Ref Range: 135 - 145 mmol/L 134 (L)  Potassium Latest Ref Range: 3.5 - 5.1 mmol/L 3.7  Chloride Latest Ref Range: 101 - 111 mmol/L 99 (L)  CO2 Latest Ref Range: 22 - 32 mmol/L 12 (L)  Glucose Latest Ref Range: 65 - 99 mg/dL 657519 (HH)  BUN Latest Ref Range: 6 - 20 mg/dL 846113 (H)  Creatinine Latest Ref Range: 0.44 - 1.00 mg/dL 9.624.11 (H)  Calcium Latest Ref Range: 8.9 - 10.3 mg/dL 8.4 (L)  Anion gap Latest Ref Range: 5 - 15  23 (H)   Admit with: DKA  History: DM (very labile CBGs), CKD, CHF  SNF DM Meds: Lantus 7 units daily    Humalog 2 units TID    Humalog SSI  Current Insulin Orders: Lantus 7 units daily        Novolog Sensitive Correction Scale/ SSI (0-9 units) TID AC + HS     -Note patient admitted with DKA.  CO2 was 12 and Anion Gap was 23.  -IV insulin drip stopped yesterday at 6pm and No Lantus was administered to patient prior to d/c of insulin drip since CBG was low when the drip was stopped.  -CBG popped up to 597 mg/dl this AM.  -Note CO2 down to 16 and Anion Gap 13 on 5am BMET today.  Called Dr. Waymon AmatoHongalgi to discuss.  Dr. Waymon AmatoHongalgi stated he plans to recheck BMET today.      --Will follow patient during hospitalization--  Ambrose FinlandJeannine Johnston Cassandra Sickman RN, MSN, CDE Diabetes Coordinator Inpatient Glycemic Control Team Team Pager: [phone] (8a-5p)

## 2017-07-23 NOTE — Clinical Social Work Note (Signed)
Clinical Social Work Assessment  Patient Details  Name: Cassandra [name] MRN: [id number] Date of Birth: Jun 18, 1946  Date of referral:  07/23/17               Reason for consult:  Discharge Planning, Family Concerns, Emotional/Coping/Adjustment to Illness, Other (Comment Required) (information regarding hospice)                Permission sought to share information with:  Case Manager, Facility Medical sales representative, Family Supports Permission granted to share information::  Yes, Verbal Permission Granted  Name::        Agency::  [facility]   Relationship::  Daughter [name] [phone]  Contact Information:     Housing/Transportation Living arrangements for the past 2 months:  Skilled Nursing Facility, Single Family Home Source of Information:  Medical Team, Adult Children, Facility Patient Interpreter Needed:  None Criminal Activity/Legal Involvement Pertinent to Current Situation/Hospitalization:  No - Comment as needed Significant Relationships:  Adult Children, Other Family Members, Spouse Lives with:  Facility Resident Do you feel safe going back to the place where you live?  Yes Need for family participation in patient care:  Yes (Comment)  Care giving concerns:  Patient recently placed at [facility] as of July 20.  Call placed to daughter who requested to learn more information about Hospice as she was told in last admission patient had 6 months to year to live per palliative medicine team and due to kidney function.  Daughter reports patient is married (not her biological father) and he is a Building services engineer.  Reports she does not feel he has the best interest of the patient and not willing to pay for long term care for patient. Patient will return to SNF as a long term patient and has currently maxed out her benefit per daughter under [facility] Medicare.  Reports she has about 5,000-6,000 in bank and they will use that to pay for her continued stay at the facility.  Reports she  would like referral to hospice of [place].  Discussed hospice benefit at home and in the facility. Will have CSW/CM follow up with daughter regarding referral.  Daughter is very clear that she does not want Palliative Medicine team involved at this time.    Social Worker assessment / plan:   LCSW completed consult and returned call to daughter regarding hospice education and information.  Daughter very concerned for her mother as she feels patient's husband is not supportive, current no one knows where he is and has a history of not wanting to pay for her care, thus takes her out of rehab. Daughter reports she is the decision maker and wants hospice referral as she wants her mother comfortable and taken care of.  Daughter reports in the last year-two years she assisted patient in spending down and applying for medicaid. Patient was awarded [employer], however husband was not wanting that money to go to SNF, thus he removed her an accused daughter of stealing patient money.  Medicaid was lost and daughter reports she is not going to apply again as this was very strenious on her.  LCSW educated daughter that this would be the long term payer source if she needed continued care at the facility. She verbalized understanding.   Patient to return to nursing facility at discharge and will have daytime CSW follow up with facility regarding return. Will defer referral to CM and make her aware of daughter wishes. FL2 updated.   Employment status:  Disabled (Comment on  whether or not currently receiving Disability) Insurance information:  Medicare PT Recommendations:  Not assessed at this time Information / Referral to community resources:  Skilled Nursing Facility  Patient/Family's Response to care:  Appreciative and understanding.  Patient/Family's Understanding of and Emotional Response to Diagnosis, Current Treatment, and Prognosis:  Daughter realistic and understands her mother is in kidney failure and  remains difficult to control blood sugars.  Understands and requests hospice at discharge to follow patient at home and at the facility pending course of treatment.  Emotional Assessment Appearance:  Appears stated age Attitude/Demeanor/Rapport:  Lethargic Affect (typically observed):  Restless Orientation:  Oriented to Self Alcohol / Substance use:  Not Applicable Psych involvement (Current and /or in the community):  Yes (Comment), Outpatient Provider  Discharge Needs  Concerns to be addressed:  Adjustment to Illness, Care Coordination, Discharge Planning Concerns Readmission within the last 30 days:  Yes Current discharge risk:  Chronically ill Barriers to Discharge:  Continued Medical Work up, [employer] InternationalFamily Issues   Cordella RegisterCoble, Lailah Marcelli N, LCSW 07/23/2017, 6:12 PM

## 2017-07-24 ENCOUNTER — Encounter (HOSPITAL_COMMUNITY): Payer: Self-pay

## 2017-07-24 DIAGNOSIS — E872 Acidosis: Secondary | ICD-10-CM

## 2017-07-24 DIAGNOSIS — E109 Type 1 diabetes mellitus without complications: Secondary | ICD-10-CM

## 2017-07-24 LAB — GLUCOSE, CAPILLARY
GLUCOSE-CAPILLARY: 263 mg/dL — AB (ref 65–99)
GLUCOSE-CAPILLARY: 61 mg/dL — AB (ref 65–99)
Glucose-Capillary: 158 mg/dL — ABNORMAL HIGH (ref 65–99)
Glucose-Capillary: 46 mg/dL — ABNORMAL LOW (ref 65–99)
Glucose-Capillary: 86 mg/dL (ref 65–99)
Glucose-Capillary: 279 mg/dL — ABNORMAL HIGH (ref 65–99)

## 2017-07-24 LAB — CBC
HCT: 26.1 % — ABNORMAL LOW (ref 36.0–46.0)
Hemoglobin: 8.5 g/dL — ABNORMAL LOW (ref 12.0–15.0)
MCH: 27.2 pg (ref 26.0–34.0)
MCHC: 32.6 g/dL (ref 30.0–36.0)
MCV: 83.7 fL (ref 78.0–100.0)
PLATELETS: 298 10*3/uL (ref 150–400)
RBC: 3.12 MIL/uL — ABNORMAL LOW (ref 3.87–5.11)
RDW: 14.7 % (ref 11.5–15.5)
WBC: 9.8 10*3/uL (ref 4.0–10.5)

## 2017-07-24 LAB — BASIC METABOLIC PANEL
Anion gap: 11 (ref 5–15)
BUN: 83 mg/dL — ABNORMAL HIGH (ref 6–20)
CALCIUM: 7.8 mg/dL — AB (ref 8.9–10.3)
CHLORIDE: 107 mmol/L (ref 101–111)
CO2: 19 mmol/L — AB (ref 22–32)
CREATININE: 3.41 mg/dL — AB (ref 0.44–1.00)
GFR calc Af Amer: 15 mL/min — ABNORMAL LOW (ref >60)
GFR calc non Af Amer: 13 mL/min — ABNORMAL LOW (ref >60)
GLUCOSE: 83 mg/dL (ref 65–99)
Potassium: 4.1 mmol/L (ref 3.5–5.1)
Sodium: 137 mmol/L (ref 135–145)

## 2017-07-24 MED ORDER — INSULIN ASPART 100 UNIT/ML ~~LOC~~ SOLN
0.0000 [IU] | Freq: Three times a day (TID) | SUBCUTANEOUS | Status: DC
  Administered 2017-07-25: 1 [IU] via SUBCUTANEOUS
  Administered 2017-07-25 – 2017-07-26 (×3): 5 [IU] via SUBCUTANEOUS
  Administered 2017-07-26 – 2017-07-27 (×2): 2 [IU] via SUBCUTANEOUS
  Administered 2017-07-27: 5 [IU] via SUBCUTANEOUS
  Administered 2017-07-27: 3 [IU] via SUBCUTANEOUS
  Administered 2017-07-28: 4 [IU] via SUBCUTANEOUS
  Administered 2017-07-28: 3 [IU] via SUBCUTANEOUS
  Administered 2017-07-29 (×2): 5 [IU] via SUBCUTANEOUS
  Administered 2017-07-29: 2 [IU] via SUBCUTANEOUS
  Administered 2017-07-30: 5 [IU] via SUBCUTANEOUS
  Administered 2017-07-30: 3 [IU] via SUBCUTANEOUS

## 2017-07-24 MED ORDER — INSULIN GLARGINE 100 UNIT/ML ~~LOC~~ SOLN
5.0000 [IU] | Freq: Every day | SUBCUTANEOUS | Status: DC
  Administered 2017-07-24 – 2017-07-26 (×3): 5 [IU] via SUBCUTANEOUS
  Filled 2017-07-24 (×4): qty 0.05

## 2017-07-24 MED ORDER — DEXTROSE 50 % IV SOLN
INTRAVENOUS | Status: AC
  Filled 2017-07-24: qty 50

## 2017-07-24 NOTE — Progress Notes (Signed)
Results for Merril AbbeMCFAYDEN, Jermiah A (MRN [id number]) as of 07/24/2017 08:44  Ref. Range 07/23/2017 17:24 07/23/2017 21:40 07/23/2017 22:04 07/23/2017 22:34 07/23/2017 23:00  Glucose-Capillary Latest Ref Range: 65 - 99 mg/dL 427170 (H) 39 (LL) 44 (LL) 48 (L) 117 (H)  Noted that blood sugar dropped to 39 mg/dl after receiving Novolog 4 units (2 units per correction scale and 2 units for meal coverage). Had been given Lantus 7 units earlier in the day.  Recommend changing Novolog correction scale to a very sensitive scale: 150-200 mg/dl=1unit 062-376201-250 mg/dl=2 units 283-151251-300 mg/dl=3 units 761-607301-350 mg/dl= 4 units 371-062351-400 mg/dl=5 units >694>400 mg/dl  Call physician  Patient may not need Novolog 2 units meal coverage if blood sugars continue to be low.  Will continue to monitor blood sugars while in the hospital.   Smith MinceKendra Kenaz Olafson RN BSN CDE Diabetes Coordinator Pager: [phone]  8am-5pm

## 2017-07-24 NOTE — Evaluation (Signed)
Physical Therapy Evaluation Patient Details Name: VERNADINE COOMBS MRN: [id number] DOB: 1946-09-15 Today's Date: 07/24/2017   History of Present Illness  pt is a 71 y/o female with pmh of dm2, recurrent DKA, nephrotic syndrome with CKD 4, HTN, admitted through ED with syncopal episode decreased level of consciousness and confusion.  Clinical Impression  Pt admitted with/for complications stated above.  Pt needing min assist for most mobility at this time.  Pt currently limited functionally due to the problems listed. ( See problems list.)   Pt will benefit from PT to maximize function and safety in order to get ready for next venue listed below.     Follow Up Recommendations SNF    Equipment Recommendations  None recommended by PT    Recommendations for Other Services       Precautions / Restrictions Precautions Precautions: Fall Precaution Comments: incontinent      Mobility  Bed Mobility Overal bed mobility: Needs Assistance Bed Mobility: Supine to Sit     Supine to sit: Min assist     General bed mobility comments: minimal assist to come forward otherwise no need for rails  Transfers Overall transfer level: Needs assistance Equipment used: Rolling walker (2 wheeled) Transfers: Sit to/from Stand Sit to Stand: Min assist         General transfer comment: minimal assist to come forward and stand   Ambulation/Gait Ambulation/Gait assistance: Min assist Ambulation Distance (Feet): 8 Feet Assistive device: Rolling walker (2 wheeled) Gait Pattern/deviations: Step-to pattern;Step-through pattern     General Gait Details: pt looked stable enough to walk out into the halls, but pt reports new pain and dizziness.  Stairs            Wheelchair Mobility    Modified Rankin (Stroke Patients Only)       Balance Overall balance assessment: Needs assistance Sitting-balance support: No upper extremity supported Sitting balance-Leahy Scale: Fair      Standing balance support: Bilateral upper extremity supported Standing balance-Leahy Scale: Poor Standing balance comment: reliant on RW for stability in standing                             Pertinent Vitals/Pain Pain Assessment: No/denies pain Faces Pain Scale: No hurt    Home Living Family/patient expects to be discharged to:: Skilled nursing facility Living Arrangements: Spouse/significant other Available Help at Discharge: Family Type of Home: Apartment Home Access: Level entry     Home Layout: One level Home Equipment: Wheelchair - [employer] - 2 wheels;Shower seat Additional Comments: Lives with daughter who works during the day.  Pt has a personal care attendant 5 days/wk from 8am-1pm and is alone from 1pm until daughter gets home from work.    Prior Function Level of Independence: Needs assistance   Gait / Transfers Assistance Needed: uses RW  ADL's / Homemaking Assistance Needed: pt reports she was performing bathing/dressing without assistance.         Hand Dominance   Dominant Hand: Right    Extremity/Trunk Assessment   Upper Extremity Assessment Upper Extremity Assessment: Defer to OT evaluation    Lower Extremity Assessment Lower Extremity Assessment: Generalized weakness    Cervical / Trunk Assessment Cervical / Trunk Assessment: Kyphotic  Communication   Communication: No difficulties  Cognition Arousal/Alertness: Awake/alert Behavior During Therapy: Flat affect Overall Cognitive Status: Within Functional Limits for tasks assessed  General Comments      Exercises     Assessment/Plan    PT Assessment Patient needs continued PT services  PT Problem List Decreased strength;Decreased activity tolerance;Decreased mobility;Decreased knowledge of use of DME       PT Treatment Interventions DME instruction;Gait training;Functional mobility training;Therapeutic  activities;Patient/family education    PT Goals (Current goals can be found in the Care Plan section)  Acute Rehab PT Goals Patient Stated Goal: need to get better and able to do for myself PT Goal Formulation: With patient Time For Goal Achievement: 08/07/17 Potential to Achieve Goals: Fair    Frequency Min 3X/week   Barriers to discharge        Co-evaluation               AM-PAC PT "6 Clicks" Daily Activity  Outcome Measure Difficulty turning over in bed (including adjusting bedclothes, sheets and blankets)?: A Little Difficulty moving from lying on back to sitting on the side of the bed? : A Little Difficulty sitting down on and standing up from a chair with arms (e.g., wheelchair, bedside commode, etc,.)?: A Little Help needed moving to and from a bed to chair (including a wheelchair)?: A Little Help needed walking in hospital room?: A Little Help needed climbing 3-5 steps with a railing? : A Little 6 Click Score: 18    End of Session   Activity Tolerance: Patient limited by fatigue Patient left: in chair;with call bell/phone within reach;with chair alarm set Nurse Communication: Mobility status PT Visit Diagnosis: Unsteadiness on feet (R26.81);Other abnormalities of gait and mobility (R26.89)    Time: 1426-1500 PT Time Calculation (min) (ACUTE ONLY): 34 min   Charges:   PT Evaluation $PT Eval Moderate Complexity: 1 Mod PT Treatments $Therapeutic Activity: 8-22 mins   PT G Codes:        07/24/2017  Chester BingKen Tyrez Berrios, PT [phone]  (pager)  Eliseo GumKenneth V Ethaniel Garfield 07/24/2017, 5:12 PM

## 2017-07-24 NOTE — Progress Notes (Signed)
cbg-46mg /dl asymptomatic ,orange juice 240 ml given tolerated well. Repeat cbg after 15 min- 61 mg/dl. Orange juice 120 ml given. Repeat cbg after 15 min-- 86 mg/dl. Continue to monitor.

## 2017-07-24 NOTE — Progress Notes (Signed)
Bladder scan done- 183 ml.

## 2017-07-24 NOTE — Progress Notes (Signed)
PROGRESS NOTE   Merril AbbeBillie A Monroy  QMV:[id number]    DOB: 18-Jan-1946    DOA: 07/22/2017  PCP: Henrine Screwshacker, Robert, MD   I have briefly reviewed patients [facility].  Brief Narrative:  71 year old female with PMH of DM 2/IDDM, recurrent episodes of DKA, brittle DM, stage IV chronic kidney disease, nephrotic syndrome, HTN, HLD, chronic anemia, C. difficile colitis, multiple recent hospitalizations (5 in the last 6 months), most recently 7/17-7/21 when she presented with syncope within 12 hours after discharge from SNF and found to be hypoglycemic with blood sugars in the 30s, sent to ED from SNF on 07/22/17 due to CBGs in the 200s over the last couple of weeks which increased to 500's on day of admission, did not respond adequately to insulin's and fluids, abdominal discomfort, nausea and ongoing diarrhea, noted to be in DKA and admitted to stepdown on insulin drip protocol, became hypoglycemic same night, insulin drip discontinued. Nephrology consulted 8/8 to assist with CKD. Adjusting insulins due to very brittle DM.   Assessment & Plan:   Active Problems:   Acute encephalopathy   Essential hypertension   HLD (hyperlipidemia)   Acute on chronic kidney failure (HCC)   Abdominal pain   DKA (diabetic ketoacidoses) (HCC)   Chronic diastolic CHF (congestive heart failure) (HCC)   1. DKA in type II DM, not at goal/recurrent DKA/brittle DM: Presented with glucose 519, bicarbonate 12, anion gap 23. Placed initially on insulin drip per protocol. Became hypoglycemic on the same night of admission and insulin drip was discontinued without starting Lantus. Blood glucose this morning 433. Anion gap has closed: 13. Bicarbonate 16, likely related to chronic kidney disease. Unable to give significant IV fluids due to chronic kidney disease. Resumed home Lantus 7 units daily, NovoLog sensitive SSI with bedtime scale and added NovoLog mealtime 2 units. Reported issues with poor  compliance with insulin's at home. A1c: 9.6. Overnight 8/8, had hypoglycemic episodes up to the 40s. Reduce Lantus to 5 units daily, changed SSI to conservative custom sensitive scale. Continue mealtime NovoLog for now. Further adjustment as deemed necessary. 2. Acute on Stage IV chronic kidney disease/nephrotic syndrome: Secondary to poorly controlled DM and HTN. Baseline creatinine probably in the 2.7-2.9 range. Creatinine at recent discharge on 7/21:3.2. Presented with creatinine of 4.11. Acute kidney injury likely related to dehydration from poor oral intake, GI losses and hyperglycemia. Brief and gentle IV fluids. Nephrology consultation and follow-up appreciated >creatinine has improved and stabilized but CKD progressive over past year and she will eventually reach ESRD but not candidate for RRT for multiple reasons indicated in their note and at that time by palliative approach will be most appropriate. Nephrology signed off 8/9. 3. Non-anion gap metabolic acidosis: Likely related to chronic kidney disease. Nephrology consulted and started sodium bicarbonate 1300 MG twice a day. Nephrology recommends continuing sodium bicarbonate. Improved. 4. Acute toxic metabolic encephalopathy: Likely secondary to multiple acute metabolic abnormalities. No clear infectious etiology. Abdominal and chest x-ray without acute findings. Seems to have resolved. Monitor. 5. Abdominal pain/diarrhea: Recent C. difficile colitis. Repeat C. difficile testing negative. KUB negative for acute findings.? Diabetic gastroparesis. Supportive treatment. Added probiotics. No diarrhea reported. 6. Essential hypertension: Mildly uncontrolled. Resumed amlodipine, carvedilol. Hold Lasix, HCTZ and Cozaar. Monitor. 7. Hyperlipidemia: Continue statins. 8. Hypothyroid: Continue levothyroxine. 9. Depression: Stable. Continue Lexapro. 10. Chronic diastolic CHF: Currently on the dry side. Holding diuretics. IV fluids completed. 11. GERD:  Holding PPI. Started Pepcid. 12. Normocytic anemia: Likely  related to chronic kidney disease. Stable. 13. Adult failure to thrive: Likely multifactorial.   DVT prophylaxis: Subcutaneous heparin Code Status: Full Family Communication: None at bedside Disposition: Admitted to stepdown unit. Continue management in stepdown unit for additional 24 hours. Possible DC back to SNF when medically improved.   Consultants:  Nephrology   Procedures:  None  Antimicrobials:  None    Subjective: Complaints of suprapubic pressure (no urinary retention) or without pain or dysuria. Also complains of pain around her anal region. No nausea or vomiting reported.  ROS: Denies chest pain, dyspnea, dizziness or lightheadedness. No bleeding reported.  Objective:  Vitals:   07/24/17 0317 07/24/17 0500 07/24/17 0854 07/24/17 1225  BP: (!) 160/73  (!) 155/68 140/80  Pulse: 79  86 77  Resp: 13  15 16   Temp: 98.3 F (36.8 C)  98.7 F (37.1 C) 98.9 F (37.2 C)  TempSrc: Oral  Oral Oral  SpO2: 99%  99% 100%  Weight:  46.1 kg (101 lb 10.1 oz)    Height:        Examination:  General exam: Pleasant middle-aged female, small built, frail, chronically ill looking, lying comfortably supine in bed. Oral mucosa moist. Respiratory system: Clear to auscultation. Respiratory effort normal. Cardiovascular system: S1 & S2 heard, RRR. No JVD, murmurs, rubs, gallops or clicks. No pedal edema. Telemetry: Sinus rhythm Gastrointestinal system: Abdomen is nondistended, soft and nontender. No organomegaly or masses felt. Normal bowel sounds heard. Central nervous system: Alert and oriented 2. No focal neurological deficits. Extremities: Symmetric 5 x 5 power. Skin: As examined with a female RN chaperone at bedside, perianal maceration related to stooling. Topical barrier cream. Psychiatry: Judgement and insight impaired. Mood & affect flat.     Data Reviewed: I have personally reviewed following labs and  imaging studies  CBC:  Recent Labs Lab 07/22/17 1041 07/23/17 0510 07/24/17 0531  WBC 11.3* 11.8* 9.8  HGB 7.5* 9.3* 8.5*  HCT 23.7* 28.5* 26.1*  MCV 85.3 84.3 83.7  PLT 397 292 298   Basic Metabolic Panel:  Recent Labs Lab 07/22/17 1721 07/22/17 2138 07/23/17 0105 07/23/17 0510 07/23/17 1637 07/24/17 0531  NA 139 134* 137 135 135 137  K 3.7 4.9 4.2 5.0 4.0 4.1  CL 106 107 107 106 108 107  CO2 22 16* 18* 16* 15* 19*  GLUCOSE 51* 67 156* 433* 167* 83  BUN 95* 94* 85* 86* 90* 83*  CREATININE 3.18* 3.22* 3.20* 3.43* 3.71* 3.41*  CALCIUM 7.7* 7.5* 7.7* 7.7* 7.6* 7.8*  MG 1.9  --   --   --   --   --   PHOS 6.5*  --   --   --   --   --    Cardiac Enzymes:  Recent Labs Lab 07/22/17 1721  TROPONINI <0.03   HbA1C:  Recent Labs  07/22/17 1721  HGBA1C 9.6*   CBG:  Recent Labs Lab 07/23/17 2204 07/23/17 2234 07/23/17 2300 07/24/17 0857 07/24/17 1231  GLUCAP 44* 48* 117* 263* 158*    Recent Results (from the past 240 hour(s))  MRSA PCR Screening     Status: None   Collection Time: 07/22/17  2:44 AM  Result Value Ref Range Status   MRSA by PCR NEGATIVE NEGATIVE Final    Comment:        The GeneXpert MRSA Assay (FDA approved for NASAL specimens only), is one component of a comprehensive MRSA colonization surveillance program. It is not intended to diagnose MRSA infection nor  to guide or monitor treatment for MRSA infections.   C difficile quick scan w PCR reflex     Status: None   Collection Time: 07/22/17  9:59 PM  Result Value Ref Range Status   C Diff antigen NEGATIVE NEGATIVE Final   C Diff toxin NEGATIVE NEGATIVE Final   C Diff interpretation No C. difficile detected.  Final         Radiology Studies: Abd 1 View (kub)  Result Date: 07/22/2017 CLINICAL DATA:  Hyperglycemia, abdominal pain, nausea, history type I diabetes mellitus, hypertension, CHF, chronic kidney disease stage III EXAM: ABDOMEN - 1 VIEW COMPARISON:  06/21/2017  FINDINGS: Surgical clips RIGHT upper quadrant likely representing prior cholecystectomy. Nonobstructive bowel gas pattern. No bowel dilatation or bowel wall thickening. Scattered vascular calcifications in pelvis. No definite urinary tract calcification. Bones demineralized. IMPRESSION: No acute abnormalities. Electronically Signed   By: Ulyses Southward M.D.   On: 07/22/2017 14:55        Scheduled Meds: . amLODipine  5 mg Oral Daily  . atorvastatin  20 mg Oral q1800  . carvedilol  6.25 mg Oral BID WC  . escitalopram  20 mg Oral BH-q7a  . famotidine  20 mg Oral Daily  . feeding supplement (PRO-STAT SUGAR FREE 64)  30 mL Oral TID  . Gerhardt's butt cream   Topical TID  . heparin  5,000 Units Subcutaneous Q8H  . insulin aspart  0-9 Units Subcutaneous TID WC  . insulin aspart  2 Units Subcutaneous TID WC  . insulin glargine  5 Units Subcutaneous Daily  . iron polysaccharides  150 mg Oral Daily  . levothyroxine  175 mcg Oral QAC breakfast  . saccharomyces boulardii  250 mg Oral BID  . sodium bicarbonate  1,300 mg Oral BID   Continuous Infusions:   LOS: 2 days     Abeeha Twist, MD, FACP, FHM. [facility] Pager [phone]  If 7PM-7AM, please contact night-coverage [email or web address] Password [email or web address] 07/24/2017, 2:45 PM

## 2017-07-24 NOTE — Progress Notes (Signed)
Pure wick taken out by PT while putting her  in a chair.

## 2017-07-24 NOTE — Progress Notes (Addendum)
Hypoglycemic Event  CBG: 39  Treatment: 15g of sugar- 44                    15g of sugar- 48                    Amp of 50- 117 Symptoms: None   Follow-up CBG: Time: 2245 CBG Result:117   Comments/MD notified: X. Blount     Lavell AnchorsKendell M Jeyren Danowski, RN   Patient had another hypoglycemic event. MD notified because this is a recurrent episode of hypoglycemia. Patient still fully conscious and asymptomatic. No additional orders at this time besides following hypoglycemic protocol. Will continue to monitor.  Update 0628: Still awaiting, results of BMP for serum glucose and anion gap.

## 2017-07-24 NOTE — Progress Notes (Signed)
Discussed with Dr. Waymon AmatoHongalgi about changing the Novolog correction scale to the very sensitive scale (0-5 units) TID and to discontinue the Novolog SENSITIVE correction scale as previously ordered.   Smith MinceKendra Gaylord Seydel RN BSN CDE Diabetes Coordinator Pager: [phone]  8am-5pm

## 2017-07-24 NOTE — Progress Notes (Signed)
[facility] Daily Rounding Note  SUBJECTIVE: Brief HPI: 71 y/o chronically-ill F from SNF with history of failure to thrive, brittle DM1, advanced CKD (secondary to diabetic nephropathy + hypertensive nephrosclerosis and frequent AKI on CKD), nephrotic syndrome, bipolar disorder, likely dementia and hx of CVA admitted with DKA. She had 11 admissions in 2017, this is her 59th of 2018. Seen many times by nephrology inpatient however she continues to refuse outpatient follow-up, most recently missed appointment with Dr. Hyman Hopes 8/1. She is approaching ESRD and a palliative approach is most appropriate.   Interval Events: Extremely labile CBG overnight. Appears patient will be returning to SNF. Pt unchanged and without new complaint. Having good UOP.   OBJECTIVE: Vitals: Blood pressure (!) 160/73, pulse 79, temperature 98.3 F (36.8 C), temperature source Oral, resp. rate 13, height 5\' 2"  (1.575 m), weight 101 lb 10.1 oz (46.1 kg), SpO2 99 %. Gen: Frail and cachectic appearing woman.  Appears older than stated age.  Pale. Thin, poor turgor. No cyanosis.  RRR, no MGR.  Abdomen: soft, not distended. Diffuse tenderness. +bs Thin extremities with poor muscle mass.  No peripheral edema Awake but oriented to place (hospital, unsure of which), name, city and daughters names.  Does not know year, month, her daughters ages/birthdays or why she is admitted.   CMP Latest Ref Rng & Units 07/24/2017 07/23/2017 07/23/2017  Glucose 65 - 99 mg/dL 83 161(W) 960(A)  BUN 6 - 20 mg/dL 54(U) 98(J) 19(J)  Creatinine 0.44 - 1.00 mg/dL 4.78(G) 9.56(O) 1.30(Q)  Sodium 135 - 145 mmol/L 137 135 135  Potassium 3.5 - 5.1 mmol/L 4.1 4.0 5.0  Chloride 101 - 111 mmol/L 107 108 106  CO2 22 - 32 mmol/L 19(L) 15(L) 16(L)  Calcium 8.9 - 10.3 mg/dL 7.8(L) 7.6(L) 7.7(L)  Total Protein 6.5 - 8.1 g/dL - - -  Total Bilirubin 0.3 - 1.2 mg/dL - - -  Alkaline Phos 38 - 126 U/L - - -  AST 15 - 41 U/L - - -  ALT 14 - 54 U/L - - -   CBC Latest Ref  Rng & Units 07/24/2017 07/23/2017 07/22/2017  WBC 4.0 - 10.5 K/uL 9.8 11.8(H) 11.3(H)  Hemoglobin 12.0 - 15.0 g/dL 6.5(H) 8.4(O) 7.5(L)  Hematocrit 36.0 - 46.0 % 26.1(L) 28.5(L) 23.7(L)  Platelets 150 - 400 K/uL 298 292 397    Lab Results  Component Value Date   IRON 50 07/02/2017   TIBC 204 (L) 07/02/2017   FERRITIN 133 07/02/2017   Inpatient medications: . amLODipine  5 mg Oral Daily  . atorvastatin  20 mg Oral q1800  . carvedilol  6.25 mg Oral BID WC  . escitalopram  20 mg Oral BH-q7a  . famotidine  20 mg Oral Daily  . feeding supplement (PRO-STAT SUGAR FREE 64)  30 mL Oral TID  . Gerhardt's butt cream   Topical TID  . heparin  5,000 Units Subcutaneous Q8H  . insulin aspart  0-9 Units Subcutaneous TID WC  . insulin aspart  2 Units Subcutaneous TID WC  . insulin glargine  5 Units Subcutaneous Daily  . iron polysaccharides  150 mg Oral Daily  . levothyroxine  175 mcg Oral QAC breakfast  . saccharomyces boulardii  250 mg Oral BID  . sodium bicarbonate  1,300 mg Oral BID   Background: Frail 71 y/o F with advanced CKD stage 4, nephrotic syndrome, poorly controlled type 1 diabetes, poorly controlled hypertension, failure to thrive and bipolar disorder here with DKA. She has had many  admissions over the past several years for failure to thrive vs medication non-compliance. She has been seen by nephrology as an inpatient several times however continues to miss outpatient follow-ups. Family considering hospice services after discharge. Palliative care approach most appropriate in this patient  Assessment/Plan:   1. Acute on Chronic Renal Failure, Nephrotic Syndrome:  Secondary to poorly controlled diabetes and hypertension compounded by frequent AKI on CKD.  She has nephrotic range proteinuria with hypoalbuminemia.  Fortunately renal fx has stabilized this admission She is heading eventually towards ERSD however is not an acceptable candidate for RRT given her frailty, lack of motivation to  follow-up with outpatient specialists, significant co-morbidities and failure to thrive.  RRT would provide little improvement to her quality of life and in fact, would likely worsen.   -Palliative approach most appropriate as patient eventiually approaches ESRD -Additional GOC discussion required. Appears that hospice will follow at SNF  2. Hyperphosphatemia Phosphorus 6.5 on admission with normal corrected calcium Little benefit of checking PTH at this time.   3. DKA in Brittle Type 1 Diabetic: DKA resolved. She continues to have extremely labile blood sugars. HbA1c 9.6% this admission.   4. Failure to Thrive in an Adult: Multifactorial in nature. Palliative services appropriate.  5. Non-anion gap metabolic acidosis:  Gap closed (DKA) however has persistently low bicarb secondary to ESRD + recent diarrhea.  -Continue Bicarb replacement   Noemi ChapelBethany Molt, DO Internal Medicine - PGY2 07/24/2017, 8:18 AM   Agree with the assessment and recommendations in the above note. Creatinine stable at this time but CKD progressive over past year and she will eventually reach ESRD. She would not be acceptable candidate for RRT given her frailty, lack of motivation to follow-up with outpatient specialists, probable dementia, significant co-morbidities and failure to thrive. RRT would not improve on her QOL. At this time recommend continuation of sodium bicarb for her metabolic acidosis (improving), control of her brittle DM as possible, avoidance of nephrotoxins, and when reaches ESRD a palliative approach will be most appropriate. Will sign off at this time  Camille Balynthia Noraa Pickeral, MD [facility] [phone] Pager 07/24/2017, 9:57 AM

## 2017-07-25 DIAGNOSIS — R627 Adult failure to thrive: Secondary | ICD-10-CM

## 2017-07-25 LAB — GLUCOSE, CAPILLARY
GLUCOSE-CAPILLARY: 444 mg/dL — AB (ref 65–99)
GLUCOSE-CAPILLARY: 198 mg/dL — AB (ref 65–99)
Glucose-Capillary: 427 mg/dL — ABNORMAL HIGH (ref 65–99)
Glucose-Capillary: 373 mg/dL — ABNORMAL HIGH (ref 65–99)
Glucose-Capillary: 203 mg/dL — ABNORMAL HIGH (ref 65–99)

## 2017-07-25 LAB — BASIC METABOLIC PANEL
ANION GAP: 11 (ref 5–15)
BUN: 84 mg/dL — AB (ref 6–20)
CALCIUM: 7.3 mg/dL — AB (ref 8.9–10.3)
CO2: 19 mmol/L — ABNORMAL LOW (ref 22–32)
Chloride: 105 mmol/L (ref 101–111)
Creatinine, Ser: 3.54 mg/dL — ABNORMAL HIGH (ref 0.44–1.00)
GFR calc Af Amer: 14 mL/min — ABNORMAL LOW (ref >60)
GFR, EST NON AFRICAN AMERICAN: 12 mL/min — AB (ref >60)
GLUCOSE: 361 mg/dL — AB (ref 65–99)
Potassium: 4.8 mmol/L (ref 3.5–5.1)
SODIUM: 135 mmol/L (ref 135–145)

## 2017-07-25 LAB — CBC
HCT: 23.8 % — ABNORMAL LOW (ref 36.0–46.0)
Hemoglobin: 7.8 g/dL — ABNORMAL LOW (ref 12.0–15.0)
MCH: 27.5 pg (ref 26.0–34.0)
MCHC: 32.8 g/dL (ref 30.0–36.0)
MCV: 83.8 fL (ref 78.0–100.0)
PLATELETS: 281 10*3/uL (ref 150–400)
RBC: 2.84 MIL/uL — ABNORMAL LOW (ref 3.87–5.11)
RDW: 14.7 % (ref 11.5–15.5)
WBC: 8.8 10*3/uL (ref 4.0–10.5)

## 2017-07-25 NOTE — Care Management Note (Signed)
Case Management Note  Patient Details  Name: Merril AbbeBillie A Hyle MRN: [id number] Date of Birth: 1945/12/31  Subjective/Objective:    Pt admitted with DKA                Action/Plan:  [facility] CSW consulted. [facility] however SNF recommended by PT 8/9.  CSW will continue to follow for discharge needs   Expected Discharge Date:                  Expected Discharge Plan:  [facility] - CS[facility] consulted)  In-House Referral:  Clinical Social Work  Discharge planning Services     Post Acute Care Choice:    Choice offered to:     DME Arranged:    DME Agency:     HH Arranged:    HH Agency:     Status of Service:     If discussed at [employer] Meetings, dates discussed:    Additional Comments:  Cherylann ParrClaxton, Annalaura Sauseda S, RN 07/25/2017, 9:35 AM

## 2017-07-25 NOTE — Progress Notes (Addendum)
PROGRESS NOTE   Cassandra [name]  [id number]    DOB: 03-11-1946    DOA: 07/22/2017  PCP: Henrine Screwshacker, Robert, MD   I have briefly reviewed patients previous medical [facility].  Brief Narrative:  71 year old female with PMH of DM 2/IDDM, recurrent episodes of DKA, brittle DM, stage IV chronic kidney disease, nephrotic syndrome, HTN, HLD, chronic anemia, C. difficile colitis, multiple recent hospitalizations (5 in the last 6 months), most recently 7/17-7/21 when she presented with syncope within 12 hours after discharge from SNF and found to be hypoglycemic with blood sugars in the 30s, sent to ED from SNF on 07/22/17 due to CBGs in the 200s over the last couple of weeks which increased to 500's on day of admission, did not respond adequately to insulin's and fluids, abdominal discomfort, nausea and ongoing diarrhea, noted to be in DKA and admitted to stepdown on insulin drip protocol, became hypoglycemic same night, insulin drip discontinued. Nephrology consulted 8/8 to assist with CKD. Adjusting insulins due to very brittle DM.Ongoing issues with hyperglycemia and hypoglycemia.   Assessment & Plan:   Active Problems:   Acute encephalopathy   Essential hypertension   HLD (hyperlipidemia)   Acute on chronic kidney failure (HCC)   Abdominal pain   DKA (diabetic ketoacidoses) (HCC)   Chronic diastolic CHF (congestive heart failure) (HCC)   1. DKA in type II DM, not at goal/recurrent DKA/brittle DM: Presented with glucose 519, bicarbonate 12, anion gap 23. Placed initially on insulin drip per protocol. Became hypoglycemic on the same night of admission and insulin drip was discontinued without starting Lantus. Blood glucose this morning 433. Anion gap has closed: 13. Bicarbonate 16, likely related to chronic kidney disease. Unable to give significant IV fluids due to chronic kidney disease. Resumed home Lantus 7 units daily, NovoLog sensitive SSI with bedtime scale and added NovoLog  mealtime 2 units. Reported issues with poor compliance with insulin's at home. A1c: 9.6. Ongoing issues with hyperglycemia in the mornings (400s) and hypoglycemia in the evenings (40s). Reduce Lantus to 5 units daily, changed NovoLog SSI to very sensitive customized scale, discontinued bedtime sliding scale and mealtime NovoLog. Continue to monitor closely. 2. Acute on Stage IV chronic kidney disease/nephrotic syndrome: Secondary to poorly controlled DM and HTN. Baseline creatinine probably in the 2.7-2.9 range. Creatinine at recent discharge on 7/21:3.2. Presented with creatinine of 4.11. Acute kidney injury likely related to dehydration from poor oral intake, GI losses and hyperglycemia. Brief and gentle IV fluids. Nephrology consultation and follow-up appreciated >creatinine has improved and stabilized but CKD progressive over past year and she will eventually reach ESRD but not candidate for RRT for multiple reasons indicated in their note and at that time by palliative approach will be most appropriate. Nephrology signed off 8/9. Creatinine stable. 3. Non-anion gap metabolic acidosis: Likely related to chronic kidney disease. Nephrology consulted and started sodium bicarbonate 1300 MG twice a day. Nephrology recommends continuing sodium bicarbonate. Improved. Bicarbonate stable in the 19 range. 4. Acute toxic metabolic encephalopathy: Likely secondary to multiple acute metabolic abnormalities. No clear infectious etiology. Abdominal and chest x-ray without acute findings. Resolved. Mental status may be at baseline. 5. Abdominal pain/diarrhea: Recent C. difficile colitis. Repeat C. difficile testing negative. KUB negative for acute findings.? Diabetic gastroparesis. Supportive treatment. Added probiotics. No diarrhea reported. Improved. 6. Essential hypertension: Mildly uncontrolled. Resumed amlodipine, carvedilol. Hold Lasix, HCTZ and Cozaar. Monitor. Mildly uncontrolled but acceptable. 7. Hyperlipidemia:  Continue statins. 8. Hypothyroid: Continue levothyroxine. 9. Depression:  Stable. Continue Lexapro. 10. Chronic diastolic CHF: Currently on the dry side. Holding diuretics. IV fluids completed. 11. GERD: Holding PPI. Started Pepcid. 12. Normocytic anemia: Likely related to chronic kidney disease. Hemoglobin fluctuating/7.8 today. No overt bleeding reported. Anemia panel 06/2017: Iron:50, B12: 343, folate 12.1, ferritin 133.? Element of iron deficiency. Continue iron supplements. Follow CBCs closely and consider transfusion if hemoglobin is 7 or less. 13. Adult failure to thrive: Likely multifactorial.   DVT prophylaxis: Subcutaneous heparin Code Status: Full Family Communication: None at bedside Disposition: Admitted to stepdown unit. Continue management in stepdown unit until glycemic control is reasonable to manage on regular floor. Possible DC back to SNF when medically improved.   Consultants:  Nephrology   Procedures:  None  Antimicrobials:  None    Subjective: States that her stomach feels queasy. Reports to lose BMs yesterday. No abdominal fullness or pain. Perianal pain better today. States that she is eating well.  ROS: Denies chest pain, dyspnea, dizziness or lightheadedness. No bleeding reported.  Objective:  Vitals:   07/25/17 0300 07/25/17 0317 07/25/17 0517 07/25/17 0746  BP: (!) 152/66 (!) 152/66  (!) 168/66  Pulse:  86 88   Resp: 15 14 15    Temp:  98.2 F (36.8 C)  98.6 F (37 C)  TempSrc:  Oral  Oral  SpO2:  97% 99%   Weight:   47.3 kg (104 lb 4.4 oz)   Height:        Examination:  General exam: Pleasant middle-aged female, small built, frail, chronically ill looking, lying comfortably supine in bed. Oral mucosa moist.Appears in good spirits this morning. Respiratory system: Clear to auscultation. Respiratory effort normal. Stable. Cardiovascular system: S1 & S2 heard, RRR. No JVD, murmurs, rubs, gallops or clicks. No pedal edema. Telemetry personally  reviewed: Sinus rhythm with frequent PACs. Otherwise stable. Gastrointestinal system: Abdomen is nondistended, soft and nontender. No organomegaly or masses felt. Normal bowel sounds heard. Stable. Central nervous system: Alert and oriented 2. No focal neurological deficits. Stable. Extremities: Symmetric 5 x 5 power. Skin: As examined with a female RN chaperone at bedside on 8/9, perianal maceration related to stooling. Topical barrier cream. Psychiatry: Judgement and insight impaired. Mood & affect pleasant and communicative.     Data Reviewed: I have personally reviewed following labs and imaging studies  CBC:  Recent Labs Lab 07/22/17 1041 07/23/17 0510 07/24/17 0531 07/25/17 0238  WBC 11.3* 11.8* 9.8 8.8  HGB 7.5* 9.3* 8.5* 7.8*  HCT 23.7* 28.5* 26.1* 23.8*  MCV 85.3 84.3 83.7 83.8  PLT 397 292 298 281   Basic Metabolic Panel:  Recent Labs Lab 07/22/17 1721  07/23/17 0105 07/23/17 0510 07/23/17 1637 07/24/17 0531 07/25/17 0238  NA 139  < > 137 135 135 137 135  K 3.7  < > 4.2 5.0 4.0 4.1 4.8  CL 106  < > 107 106 108 107 105  CO2 22  < > 18* 16* 15* 19* 19*  GLUCOSE 51*  < > 156* 433* 167* 83 361*  BUN 95*  < > 85* 86* 90* 83* 84*  CREATININE 3.18*  < > 3.20* 3.43* 3.71* 3.41* 3.54*  CALCIUM 7.7*  < > 7.7* 7.7* 7.6* 7.8* 7.3*  MG 1.9  --   --   --   --   --   --   PHOS 6.5*  --   --   --   --   --   --   < > = values in this  interval not displayed. Cardiac Enzymes:  Recent Labs Lab 07/22/17 1721  TROPONINI <0.03   HbA1C:  Recent Labs  07/22/17 1721  HGBA1C 9.6*   CBG:  Recent Labs Lab 07/24/17 1723 07/24/17 1755 07/24/17 2141 07/25/17 0749 07/25/17 1004  GLUCAP 61* 86 279* 444* 427*    Recent Results (from the past 240 hour(s))  MRSA PCR Screening     Status: None   Collection Time: 07/22/17  2:44 AM  Result Value Ref Range Status   MRSA by PCR NEGATIVE NEGATIVE Final    Comment:        The GeneXpert MRSA Assay (FDA approved for NASAL  specimens only), is one component of a comprehensive MRSA colonization surveillance program. It is not intended to diagnose MRSA infection nor to guide or monitor treatment for MRSA infections.   C difficile quick scan w PCR reflex     Status: None   Collection Time: 07/22/17  9:59 PM  Result Value Ref Range Status   C Diff antigen NEGATIVE NEGATIVE Final   C Diff toxin NEGATIVE NEGATIVE Final   C Diff interpretation No C. difficile detected.  Final         Radiology Studies: No results found.      Scheduled Meds: . amLODipine  5 mg Oral Daily  . atorvastatin  20 mg Oral q1800  . carvedilol  6.25 mg Oral BID WC  . escitalopram  20 mg Oral BH-q7a  . famotidine  20 mg Oral Daily  . feeding supplement (PRO-STAT SUGAR FREE 64)  30 mL Oral TID  . Gerhardt's butt cream   Topical TID  . heparin  5,000 Units Subcutaneous Q8H  . insulin aspart  0-5 Units Subcutaneous TID WC  . insulin glargine  5 Units Subcutaneous Daily  . iron polysaccharides  150 mg Oral Daily  . levothyroxine  175 mcg Oral QAC breakfast  . saccharomyces boulardii  250 mg Oral BID  . sodium bicarbonate  1,300 mg Oral BID   Continuous Infusions:   LOS: 3 days     HONGALGI,ANAND, MD, FACP, FHM. [facility] Pager [phone]  If 7PM-7AM, please contact night-coverage [email or web address] Password [email or web address] 07/25/2017, 10:26 AM

## 2017-07-25 NOTE — Progress Notes (Signed)
Patient has eaten greater than 75% of each meal today, has had two small loose bowel movements and denied several request to get out of bed to chair.

## 2017-07-26 LAB — CBC
HEMATOCRIT: 23.6 % — AB (ref 36.0–46.0)
Hemoglobin: 7.8 g/dL — ABNORMAL LOW (ref 12.0–15.0)
MCH: 28 pg (ref 26.0–34.0)
MCHC: 33.1 g/dL (ref 30.0–36.0)
MCV: 84.6 fL (ref 78.0–100.0)
PLATELETS: 252 10*3/uL (ref 150–400)
RBC: 2.79 MIL/uL — AB (ref 3.87–5.11)
RDW: 14.6 % (ref 11.5–15.5)
WBC: 8 10*3/uL (ref 4.0–10.5)

## 2017-07-26 LAB — BASIC METABOLIC PANEL
ANION GAP: 9 (ref 5–15)
BUN: 90 mg/dL — ABNORMAL HIGH (ref 6–20)
CO2: 20 mmol/L — AB (ref 22–32)
Calcium: 7.6 mg/dL — ABNORMAL LOW (ref 8.9–10.3)
Chloride: 107 mmol/L (ref 101–111)
Creatinine, Ser: 3.41 mg/dL — ABNORMAL HIGH (ref 0.44–1.00)
GFR, EST AFRICAN AMERICAN: 15 mL/min — AB (ref >60)
GFR, EST NON AFRICAN AMERICAN: 13 mL/min — AB (ref >60)
GLUCOSE: 365 mg/dL — AB (ref 65–99)
POTASSIUM: 4.9 mmol/L (ref 3.5–5.1)
Sodium: 136 mmol/L (ref 135–145)

## 2017-07-26 LAB — GLUCOSE, CAPILLARY
GLUCOSE-CAPILLARY: 220 mg/dL — AB (ref 65–99)
GLUCOSE-CAPILLARY: 43 mg/dL — AB (ref 65–99)
GLUCOSE-CAPILLARY: 94 mg/dL (ref 65–99)
Glucose-Capillary: 406 mg/dL — ABNORMAL HIGH (ref 65–99)
Glucose-Capillary: 438 mg/dL — ABNORMAL HIGH (ref 65–99)

## 2017-07-26 MED ORDER — AMLODIPINE BESYLATE 10 MG PO TABS
10.0000 mg | ORAL_TABLET | Freq: Every day | ORAL | Status: DC
  Administered 2017-07-27 – 2017-07-30 (×4): 10 mg via ORAL
  Filled 2017-07-26 (×4): qty 1

## 2017-07-26 MED ORDER — INSULIN ASPART 100 UNIT/ML ~~LOC~~ SOLN
2.0000 [IU] | Freq: Three times a day (TID) | SUBCUTANEOUS | Status: DC
  Administered 2017-07-26 (×2): 2 [IU] via SUBCUTANEOUS

## 2017-07-26 MED ORDER — AMLODIPINE BESYLATE 5 MG PO TABS
5.0000 mg | ORAL_TABLET | Freq: Once | ORAL | Status: AC
  Administered 2017-07-26: 5 mg via ORAL
  Filled 2017-07-26: qty 1

## 2017-07-26 NOTE — Progress Notes (Signed)
CSW spoke with patient's daughter, Cassandra [name], regarding discharge plan. She reported being frustrated by her mother's situation. She reports that patient was picked up from [facility] by her husband with the intent of taking patient home before this last hospital admission, but husband has been MIA for two weeks with no contact. Patient currently altered, but stating that she does not want to go back home with him, but patient's daughter states that patient changes her mind all the time due to "codependency". Daughter is aware that patient owes [facility] $1300 plus [phone] for a month in a advance. Daughter does not think patient's spouse will pay the money and will say he is taking patient home at discharge. CSW called spouse, Cassandra HuaDavid [name](Zeke [phone]) with no answer and no voicemail available. Daughter does not believe patient's spouse will allow hospice into their home. Daughter works and does not have money to pay for patient to go to [facility], so [facility] plan still unclear. CSW to continue to try to get in touch with patient's spouse.  Cassandra [name] LCSWA [phone]

## 2017-07-26 NOTE — Progress Notes (Addendum)
PROGRESS NOTE   Cassandra [name]  WUJ:[id number]    DOB: August 26, 1946    DOA: 07/22/2017  PCP: Henrine Screws, MD   I have briefly reviewed patients previous medical records in [facility].  Brief Narrative:  71 year old female with PMH of DM 2/IDDM, recurrent episodes of DKA, brittle DM, stage IV chronic kidney disease, nephrotic syndrome, HTN, HLD, chronic anemia, C. difficile colitis, multiple recent hospitalizations (5 in the last 6 months), most recently 7/17-7/21 when she presented with syncope within 12 hours after discharge from SNF and found to be hypoglycemic with blood sugars in the 30s, sent to ED from SNF on 07/22/17 due to CBGs in the 200s over the last couple of weeks which increased to 500's on day of admission, did not respond adequately to insulin's and fluids, abdominal discomfort, nausea and ongoing diarrhea, noted to be in DKA and admitted to stepdown on insulin drip protocol, became hypoglycemic same night, insulin drip discontinued. Nephrology consulted 8/8 to assist with CKD. Adjusting insulins due to very brittle DM.Ongoing issues with hyperglycemia and hypoglycemia.   Assessment & Plan:   Active Problems:   Acute encephalopathy   Essential hypertension   HLD (hyperlipidemia)   Acute on chronic kidney failure (HCC)   Abdominal pain   DKA (diabetic ketoacidoses) (HCC)   Chronic diastolic CHF (congestive heart failure) (HCC)   1. DKA in type II DM, not at goal/recurrent DKA/brittle DM: Presented with glucose 519, bicarbonate 12, anion gap 23. Placed initially on insulin drip per protocol. Became hypoglycemic on the same night of admission and insulin drip was discontinued without starting Lantus. Blood glucose this morning 433. Anion gap has closed: 13. Bicarbonate 16, likely related to chronic kidney disease. Unable to give significant IV fluids due to chronic kidney disease. Resumed home Lantus 7 units daily, NovoLog sensitive SSI with bedtime scale and added NovoLog  mealtime 2 units. Reported issues with poor compliance with insulin's at home. A1c: 9.6. Had ongoing issues with hyperglycemia in the mornings (400s) and hypoglycemia in the evenings (40s). Reduced Lantus to 5 units daily, changed NovoLog SSI to very sensitive customized scale, discontinued bedtime sliding scale and mealtime NovoLog. No hypoglycemic episodes in the last 24 hours and as per RN report, has been eating better. Reinitiate NovoLog 2 units 3 times a day with meals and monitor closely. 2. Acute on Stage IV chronic kidney disease/nephrotic syndrome: Secondary to poorly controlled DM and HTN. Baseline creatinine probably in the 2.7-2.9 range. Creatinine at recent discharge on 7/21:3.2. Presented with creatinine of 4.11. Acute kidney injury likely related to dehydration from poor oral intake, GI losses and hyperglycemia. Brief and gentle IV fluids. Nephrology consultation and follow-up appreciated >creatinine has improved and stabilized but CKD progressive over past year and she will eventually reach ESRD but not candidate for RRT for multiple reasons indicated in their note and at that time by palliative approach will be most appropriate. Nephrology signed off 8/9. Creatinine stable and periodically follow up as outpatient/SNF.. 3. Non-anion gap metabolic acidosis: Likely related to chronic kidney disease. Nephrology consulted and started sodium bicarbonate 1300 MG twice a day. Nephrology recommends continuing sodium bicarbonate. Improved. Bicarbonate stable in the 19-20 range. 4. Acute toxic metabolic encephalopathy: Likely secondary to multiple acute metabolic abnormalities. No clear infectious etiology. Abdominal and chest x-ray without acute findings. Resolved. Mental status may be at baseline. 5. Abdominal pain/diarrhea: Recent C. difficile colitis. Repeat C. difficile testing negative. KUB negative for acute findings.? Diabetic gastroparesis. Supportive treatment. Added probiotics.  Improved. As per  RN report, had to loose stools yesterday. 6. Essential hypertension: Mildly uncontrolled. Resumed carvedilol. Hold Lasix, HCTZ and Cozaar. Monitor. Mildly uncontrolled. Increased amlodipine to 10 MG daily. 7. Hyperlipidemia: Continue statins. 8. Hypothyroid: Continue levothyroxine. 9. Depression: Stable. Continue Lexapro. 10. Chronic diastolic CHF: Euvolemic clinically. Holding diuretics. IV fluids completed. 11. GERD: Holding PPI. Started Pepcid. 12. Normocytic anemia: Likely related to chronic kidney disease. Hemoglobin fluctuating/7.8 today. No overt bleeding reported. Anemia panel 06/2017: Iron:50, B12: 343, folate 12.1, ferritin 133.? Element of iron deficiency. Continue iron supplements. Follow CBCs closely and consider transfusion if hemoglobin is 7 or less. Hemoglobin stable in the 7.8 range over the last 2 days. Recheck on Monday. 13. Adult failure to thrive: Likely multifactorial.   DVT prophylaxis: Subcutaneous heparin Code Status: Full Family Communication: None at bedside. Left VM message for daughter to call back for updates. Disposition: Admitted to stepdown unit. Continue management in stepdown unit until glycemic control is reasonable to manage on regular floor. If no further hypoglycemic episodes, may consider DC to SNF pending bed availability in the next 1-2 days.   Consultants:  Nephrology -signed off.  Procedures:  None  Antimicrobials:  None    Subjective: "I am constipated" although she had 2 loose stools yesterday. On further questioning, states that she feels like she wants to have a BM now. No pain reported. As per RN, ate 75% of her meals yesterday  ROS: Denies chest pain, dyspnea, dizziness or lightheadedness. No bleeding reported.  Objective:  Vitals:   07/26/17 0300 07/26/17 0342 07/26/17 0500 07/26/17 0800  BP: (!) 180/87 (!) 164/71 (!) 177/77 (!) 172/80  Pulse: 75 83  86  Resp: 16 15 (!) 7 (!) 7  Temp:  98.5 F (36.9 C)  98.4 F (36.9 C)    TempSrc:  Oral  Oral  SpO2: 98% 90%  95%  Weight:   46.8 kg (103 lb 2.8 oz)   Height:        Examination:  General exam: Pleasant middle-aged female, small built, frail, chronically ill looking, lying comfortably supine in bed. Oral mucosa moist.  Respiratory system: Clear to auscultation. Respiratory effort normal. Stable without change. Cardiovascular system: S1 and S2 heard, RRR. No JVD, murmurs or pedal edema. Telemetry personally reviewed: Sinus rhythm with occasional PACs. Gastrointestinal system: Abdomen is nondistended, soft and nontender. No organomegaly or masses felt. Normal bowel sounds heard. Stable without change. Central nervous system: Alert and oriented 2. No focal neurological deficits. Stable without change. Extremities: Symmetric 5 x 5 power. Skin: As examined with a female RN chaperone at bedside on 8/9, perianal maceration related to stooling. Topical barrier cream. Psychiatry: Judgement and insight impaired. Mood & affect pleasant and communicative.     Data Reviewed: I have personally reviewed following labs and imaging studies  CBC:  Recent Labs Lab 07/22/17 1041 07/23/17 0510 07/24/17 0531 07/25/17 0238 07/26/17 0344  WBC 11.3* 11.8* 9.8 8.8 8.0  HGB 7.5* 9.3* 8.5* 7.8* 7.8*  HCT 23.7* 28.5* 26.1* 23.8* 23.6*  MCV 85.3 84.3 83.7 83.8 84.6  PLT 397 292 298 281 252   Basic Metabolic Panel:  Recent Labs Lab 07/22/17 1721  07/23/17 0510 07/23/17 1637 07/24/17 0531 07/25/17 0238 07/26/17 0344  NA 139  < > 135 135 137 135 136  K 3.7  < > 5.0 4.0 4.1 4.8 4.9  CL 106  < > 106 108 107 105 107  CO2 22  < > 16* 15* 19* 19* 20*  GLUCOSE 51*  < > 433* 167* 83 361* 365*  BUN 95*  < > 86* 90* 83* 84* 90*  CREATININE 3.18*  < > 3.43* 3.71* 3.41* 3.54* 3.41*  CALCIUM 7.7*  < > 7.7* 7.6* 7.8* 7.3* 7.6*  MG 1.9  --   --   --   --   --   --   PHOS 6.5*  --   --   --   --   --   --   < > = values in this interval not displayed. Cardiac  Enzymes:  Recent Labs Lab 07/22/17 1721  TROPONINI <0.03   HbA1C: No results for input(s): HGBA1C in the last 72 hours. CBG:  Recent Labs Lab 07/25/17 1004 07/25/17 1234 07/25/17 1605 07/25/17 2156 07/26/17 0808  GLUCAP 427* 373* 198* 203* 406*    Recent Results (from the past 240 hour(s))  MRSA PCR Screening     Status: None   Collection Time: 07/22/17  2:44 AM  Result Value Ref Range Status   MRSA by PCR NEGATIVE NEGATIVE Final    Comment:        The GeneXpert MRSA Assay (FDA approved for NASAL specimens only), is one component of a comprehensive MRSA colonization surveillance program. It is not intended to diagnose MRSA infection nor to guide or monitor treatment for MRSA infections.   C difficile quick scan w PCR reflex     Status: None   Collection Time: 07/22/17  9:59 PM  Result Value Ref Range Status   C Diff antigen NEGATIVE NEGATIVE Final   C Diff toxin NEGATIVE NEGATIVE Final   C Diff interpretation No C. difficile detected.  Final         Radiology Studies: No results found.      Scheduled Meds: . amLODipine  5 mg Oral Daily  . atorvastatin  20 mg Oral q1800  . carvedilol  6.25 mg Oral BID WC  . escitalopram  20 mg Oral BH-q7a  . famotidine  20 mg Oral Daily  . feeding supplement (PRO-STAT SUGAR FREE 64)  30 mL Oral TID  . Gerhardt's butt cream   Topical TID  . heparin  5,000 Units Subcutaneous Q8H  . insulin aspart  0-5 Units Subcutaneous TID WC  . insulin aspart  2 Units Subcutaneous TID WC  . insulin glargine  5 Units Subcutaneous Daily  . iron polysaccharides  150 mg Oral Daily  . levothyroxine  175 mcg Oral QAC breakfast  . saccharomyces boulardii  250 mg Oral BID  . sodium bicarbonate  1,300 mg Oral BID   Continuous Infusions:   LOS: 4 days     Gaven Eugene, MD, FACP, FHM. [facility] Pager [phone]  If 7PM-7AM, please contact night-coverage [email or web address] Password [email or web address] 07/26/2017, 10:51 AM

## 2017-07-26 NOTE — Progress Notes (Signed)
CS[facility] spoke with [facility] regarding [facility] return. [facility] reported that patient owes them money and will have to check to see if she is able to return. CSW to continue to follow.  Osborne Cascoadia Taelyn Nemes LCSWA [phone]

## 2017-07-27 DIAGNOSIS — Z515 Encounter for palliative care: Secondary | ICD-10-CM

## 2017-07-27 LAB — GLUCOSE, CAPILLARY
Glucose-Capillary: 378 mg/dL — ABNORMAL HIGH (ref 65–99)
Glucose-Capillary: 239 mg/dL — ABNORMAL HIGH (ref 65–99)
Glucose-Capillary: 285 mg/dL — ABNORMAL HIGH (ref 65–99)
Glucose-Capillary: 232 mg/dL — ABNORMAL HIGH (ref 65–99)

## 2017-07-27 MED ORDER — INSULIN GLARGINE 100 UNIT/ML ~~LOC~~ SOLN
3.0000 [IU] | Freq: Every day | SUBCUTANEOUS | Status: DC
  Administered 2017-07-27 – 2017-07-30 (×4): 3 [IU] via SUBCUTANEOUS
  Filled 2017-07-27 (×4): qty 0.03

## 2017-07-27 MED ORDER — ACETAMINOPHEN 325 MG PO TABS
650.0000 mg | ORAL_TABLET | Freq: Four times a day (QID) | ORAL | Status: DC | PRN
  Administered 2017-07-30: 650 mg via ORAL
  Filled 2017-07-27: qty 2

## 2017-07-27 MED ORDER — INSULIN ASPART 100 UNIT/ML ~~LOC~~ SOLN
2.0000 [IU] | Freq: Every day | SUBCUTANEOUS | Status: DC
  Administered 2017-07-27: 2 [IU] via SUBCUTANEOUS

## 2017-07-27 NOTE — Progress Notes (Signed)
NURSING PROGRESS NOTE  Cassandra AbbeBillie A Ollis [id number] Transfer Data: 07/27/2017 2:45 PM Attending Provider: Elease EtienneHongalgi, Anand D, MD WUJ:[id number]:Thacker, Molly Maduroobert, MD Code Status: Full   Cassandra [name] is a 71 y.o. female patient transferred from [facility] -No acute distress noted.  -No complaints of shortness of breath.  -No complaints of chest pain.     Blood pressure (!) 152/72, pulse 68, temperature 98.4 F (36.9 C), temperature source Oral, resp. rate 15, height 5\' 2"  (1.575 m), weight 48.8 kg (107 lb 9.4 oz), SpO2 100 %.   IV Fluids:  IV in place, occlusive dsg intact without redness, Allergies:  Alprazolam; Percocet [oxycodone-acetaminophen]; Codeine; Doxycycline; Hydrocodone; Omnicef [cefdinir]; Tramadol; Augmentin [amoxicillin-pot clavulanate]; and Ciprofloxacin  Past Medical History:   has a past medical history of Allergic rhinitis; Anemia; Anxiety; Arthritis; Benign hypertension with CKD (chronic kidney disease) stage III; Benign paroxysmal positional vertigo; Bipolar disorder (HCC); Broken finger; Broken shoulder; Broken toes; Cervicalgia; CHF (congestive heart failure) (HCC) (07/2016); CKD (chronic kidney disease) stage 3, GFR 30-59 ml/min; COPD (chronic obstructive pulmonary disease) (HCC); Depression; Elevated liver enzymes (Hep B/C neg 2014); Fall at nursing home (10/15/2016); GERD (gastroesophageal reflux disease); Glaucoma; High cholesterol; History of alcohol use; History of blood transfusion (09/2016); Hypertension; Hypothyroidism; Interstitial cystitis; Migraines; Pneumonia (12/2016); Psoriasis; Stroke (HCC) (05/16/2016); Thyroid disease; Tobacco use; 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  Past Surgical History:   has a past surgical history that includes Appendectomy; Tonsillectomy; Hernia repair; Laparoscopic cholecystectomy; Back surgery; Anterior cervical decomp/discectomy fusion (~ 2009); Vaginal hysterectomy; Dilation and curettage of  uterus; Tubal ligation; Bladder suspension; and Cataract extraction w/ intraocular lens  implant, bilateral (Bilateral).  Social History:   reports that she quit smoking about 93 years ago. Her smoking use included Cigarettes. She has a 2.50 pack-year smoking history. She has never used smokeless tobacco. She reports that she does not drink alcohol or use drugs.  Skin: Abrasion noted on left shin and left Arm, ecchymosis on left arm and MSAD on pt's perineum. Skin dry and flaky    Patient orientated to room. Admission inpatient armband information verified with patient/family to include name and date of birth and placed on patient arm. Side rails up x 2, fall assessment and education completed with patient/family. Patient/family able to verbalize understanding of risk associated with falls and verbalized understanding to call for assistance before getting out of bed. Call light within reach. s.    Will continue to evaluate and treat per MD orders.

## 2017-07-27 NOTE — Progress Notes (Addendum)
PROGRESS NOTE   Cassandra [name]  ZOX:[id number]    DOB: [birth year]    DOA: 07/22/2017  PCP: Henrine Screws, MD   I have briefly reviewed patients previous medical records in [facility].  Brief Narrative:  71 year old female with PMH of DM 2/IDDM, recurrent episodes of DKA, brittle DM, stage IV chronic kidney disease, nephrotic syndrome, HTN, HLD, chronic anemia, C. difficile colitis, multiple recent hospitalizations (5 in the last 6 months), most recently 7/17-7/21 when she presented with syncope within 12 hours after discharge from SNF and found to be hypoglycemic with blood sugars in the 30s, sent to ED from SNF on 07/22/17 due to CBGs in the 200s over the last couple of weeks which increased to 500's on day of admission, did not respond adequately to insulin's and fluids, abdominal discomfort, nausea and ongoing diarrhea, noted to be in DKA and admitted to stepdown on insulin drip protocol, became hypoglycemic same night, insulin drip discontinued. Nephrology consulted 8/8 to assist with CKD. Adjusting insulins due to very brittle DM.Ongoing issues with hyperglycemia and hypoglycemia.   Assessment & Plan:   Active Problems:   Acute encephalopathy   Essential hypertension   HLD (hyperlipidemia)   Acute on chronic kidney failure (HCC)   Abdominal pain   DKA (diabetic ketoacidoses) (HCC)   Chronic diastolic CHF (congestive heart failure) (HCC)   1. DKA in type II DM, not at goal/recurrent DKA/brittle DM: Presented with glucose 519, bicarbonate 12, anion gap 23. Placed initially on insulin drip per protocol. Became hypoglycemic on the same night of admission and insulin drip was discontinued without starting Lantus. Blood glucose this morning 433. Anion gap has closed: 13. Bicarbonate 16, likely related to chronic kidney disease. Unable to give significant IV fluids due to chronic kidney disease. Resumed home Lantus 7 units daily, NovoLog sensitive SSI with bedtime scale and added NovoLog  mealtime 2 units. Reported issues with poor compliance with insulin's at home. A1c: 9.6. Has been having ongoing issues with hyperglycemia in the mornings (400s) and hypoglycemia in the evenings (40s). Reduced Lantus to 3 units daily, changed NovoLog SSI to very sensitive customized scale, discontinued bedtime sliding scale. Again had evening hypoglycemia on 8/11. Changed NovoLog from 2 units TIDWC to 2 units with breakfast only. Monitor closely. Difficult management. 2. Acute on Stage IV chronic kidney disease/nephrotic syndrome: Secondary to poorly controlled DM and HTN. Baseline creatinine probably in the 2.7-2.9 range. Creatinine at recent discharge on 7/21:3.2. Presented with creatinine of 4.11. Acute kidney injury likely related to dehydration from poor oral intake, GI losses and hyperglycemia. Brief and gentle IV fluids. Nephrology consultation and follow-up appreciated >creatinine has improved and stabilized but CKD progressive over past year and she will eventually reach ESRD but not candidate for RRT for multiple reasons indicated in their note and at that time by palliative approach will be most appropriate. Nephrology signed off 8/9. Creatinine stable and periodically follow up as outpatient/SNF. 3. Non-anion gap metabolic acidosis: Likely related to chronic kidney disease. Nephrology consulted and started sodium bicarbonate 1300 MG twice a day. Nephrology recommends continuing sodium bicarbonate. Improved. Bicarbonate stable in the 19-20 range. Follow BMP in a.m. and consider reducing oral bicarbonate tablets. 4. Acute toxic metabolic encephalopathy: Likely secondary to multiple acute metabolic abnormalities. No clear infectious etiology. Abdominal and chest x-ray without acute findings. Resolved. Mental status back to baseline. 5. Abdominal pain/diarrhea: Recent C. difficile colitis. Repeat C. difficile testing negative. KUB negative for acute findings.? Diabetic gastroparesis. Supportive  treatment. Added  probiotics. Intermittent 2-3 loose/soft BMs per day. When necessary Imodium. 6. Essential hypertension: Mildly uncontrolled. Resumed carvedilol. Hold Lasix, HCTZ and Cozaar. Monitor. Mildly uncontrolled. Increased amlodipine to 10 MG daily. No change in treatment. 7. Hyperlipidemia: Continue statins. 8. Hypothyroid: Continue levothyroxine. 9. Depression: Stable. Continue Lexapro. 10. Chronic diastolic CHF: Euvolemic clinically. Holding diuretics. IV fluids completed. 11. GERD: Holding PPI. Started Pepcid. 12. Normocytic anemia: Likely related to chronic kidney disease. Hemoglobin fluctuating/7.8 today. No overt bleeding reported. Anemia panel 06/2017: Iron:50, B12: 343, folate 12.1, ferritin 133.? Element of iron deficiency. Continue iron supplements. Follow CBCs closely and consider transfusion if hemoglobin is 7 or less. Hemoglobin stable in the 7.8 range over the last 2 days. Recheck on Monday. 13. Adult failure to thrive: Likely multifactorial. Social work input appreciated. Difficult social situation. As per daughter's report, spouse not being supportive, has left SNF in the past prior to time, owes money to SNF and spouse currently not reachable, daughter unable to take care of patient. Strongly recommend palliative care consultation for goals   D.VT prophylaxis: Subcutaneous heparin Code Status: Full Family Communication: Discussed with daughter via phone, updated care and answered questions. She agreed to a Palliative Care consult. Disposition: Admitted to stepdown unit. If no further hypoglycemic episodes, may consider DC to SNF pending bed availability in the next 1-2 days or based on palliative care input. Transfer to medical bed 8/12.   Consultants:  Nephrology -signed off.  Procedures:  None  Antimicrobials:  None    Subjective: Chronic intermittent abdominal pain. 2-3 loose stools reported yesterday by RN. Again became hypoglycemic yesterday evening in the  40s. As per daughter's report to RN, patient refuses to get out of bed and has not moved around much in the last 2 years.   ROS: Denies chest pain, dyspnea, dizziness or lightheadedness. No bleeding reported.  Objective:  Vitals:   07/26/17 2322 07/27/17 0332 07/27/17 0521 07/27/17 0822  BP: (!) 163/73 (!) 155/71  (!) 178/75  Pulse: 78 76  78  Resp: 19 (!) 21  17  Temp: 98.4 F (36.9 C) 98.7 F (37.1 C)  98.8 F (37.1 C)  TempSrc: Oral Oral  Oral  SpO2: 96% 97%  98%  Weight:   48.8 kg (107 lb 9.4 oz)   Height:        Examination:  General exam: Pleasant middle-aged female, small built, frail, chronically ill looking, lying comfortably supine in bed. Oral mucosa moist.  Respiratory system: Clear to auscultation. Respiratory effort normal. Stable. Cardiovascular system: S1 and S2 heard, RRR. No JVD, murmurs or pedal edema. Telemetry personally reviewed: Sinus rhythm with occasional PACs. Stable.  Gastrointestinal system: Abdomen is nondistended, soft and nontender. No organomegaly or masses felt. Normal bowel sounds heard. Stable Central nervous system: Alert and oriented 2. No focal neurological deficits. Stable Extremities: Symmetric 5 x 5 power. Skin: As examined with a female RN chaperone at bedside on 8/9, perianal maceration related to stooling. Topical barrier cream. Psychiatry: Judgement and insight impaired. Mood & affect pleasant and communicative.     Data Reviewed: I have personally reviewed following labs and imaging studies  CBC:  Recent Labs Lab 07/22/17 1041 07/23/17 0510 07/24/17 0531 07/25/17 0238 07/26/17 0344  WBC 11.3* 11.8* 9.8 8.8 8.0  HGB 7.5* 9.3* 8.5* 7.8* 7.8*  HCT 23.7* 28.5* 26.1* 23.8* 23.6*  MCV 85.3 84.3 83.7 83.8 84.6  PLT 397 292 298 281 252   Basic Metabolic Panel:  Recent Labs Lab 07/22/17 1721  07/23/17 0510  07/23/17 1637 07/24/17 0531 07/25/17 0238 07/26/17 0344  NA 139  < > 135 135 137 135 136  K 3.7  < > 5.0 4.0 4.1  4.8 4.9  CL 106  < > 106 108 107 105 107  CO2 22  < > 16* 15* 19* 19* 20*  GLUCOSE 51*  < > 433* 167* 83 361* 365*  BUN 95*  < > 86* 90* 83* 84* 90*  CREATININE 3.18*  < > 3.43* 3.71* 3.41* 3.54* 3.41*  CALCIUM 7.7*  < > 7.7* 7.6* 7.8* 7.3* 7.6*  MG 1.9  --   --   --   --   --   --   PHOS 6.5*  --   --   --   --   --   --   < > = values in this interval not displayed. Cardiac Enzymes:  Recent Labs Lab 07/22/17 1721  TROPONINI <0.03   HbA1C: No results for input(s): HGBA1C in the last 72 hours. CBG:  Recent Labs Lab 07/26/17 1230 07/26/17 1627 07/26/17 1716 07/26/17 2130 07/27/17 0800  GLUCAP 220* 43* 94 438* 378*    Recent Results (from the past 240 hour(s))  MRSA PCR Screening     Status: None   Collection Time: 07/22/17  2:44 AM  Result Value Ref Range Status   MRSA by PCR NEGATIVE NEGATIVE Final    Comment:        The GeneXpert MRSA Assay (FDA approved for NASAL specimens only), is one component of a comprehensive MRSA colonization surveillance program. It is not intended to diagnose MRSA infection nor to guide or monitor treatment for MRSA infections.   C difficile quick scan w PCR reflex     Status: None   Collection Time: 07/22/17  9:59 PM  Result Value Ref Range Status   C Diff antigen NEGATIVE NEGATIVE Final   C Diff toxin NEGATIVE NEGATIVE Final   C Diff interpretation No C. difficile detected.  Final         Radiology Studies: No results found.      Scheduled Meds: . amLODipine  10 mg Oral Daily  . atorvastatin  20 mg Oral q1800  . carvedilol  6.25 mg Oral BID WC  . escitalopram  20 mg Oral BH-q7a  . famotidine  20 mg Oral Daily  . feeding supplement (PRO-STAT SUGAR FREE 64)  30 mL Oral TID  . Gerhardt's butt cream   Topical TID  . heparin  5,000 Units Subcutaneous Q8H  . insulin aspart  0-5 Units Subcutaneous TID WC  . insulin aspart  2 Units Subcutaneous Q breakfast  . insulin glargine  3 Units Subcutaneous Daily  . iron  polysaccharides  150 mg Oral Daily  . levothyroxine  175 mcg Oral QAC breakfast  . saccharomyces boulardii  250 mg Oral BID  . sodium bicarbonate  1,300 mg Oral BID   Continuous Infusions:   LOS: 5 days     HONGALGI,ANAND, MD, FACP, FHM. [facility] Pager [phone]  If 7PM-7AM, please contact night-coverage [email or web address] Password [email or web address] 07/27/2017, 11:30 AM

## 2017-07-27 NOTE — Consult Note (Signed)
Consultation Note Date: 07/27/2017   Patient Name: Cassandra [name]  DOB: [birth year]  MRN: [id number]  Age / Sex: 71 y.o., female  PCP: Aura Dials, MD Referring Physician: Modena Jansky, MD  Reason for Consultation: Establishing goals of care and Psychosocial/spiritual support  HPI/Patient Profile: 71 y.o. female  with past medical history of Diabetes type 2, recurrent episodes of DKA, due to diabetic, stage IV chronic kidney disease, nephrotic syndrome, hypertension, hyperlipidemia, anemia, history of C. difficile, multiple hospitalizations over (5 in the last 6 months),  admitted on 07/22/2017 from [facility] with a blood sugar of over 500. She is complaining of abdominal discomfort, nausea, diarrhea.  Palliative medicine consult requested for goals of care. She has been seen by palliative medicine providers in 10/21/2016, 07/03/2017, 07/04/2017.Marland Kitchen   Clinical Assessment and Goals of Care: Spoke to patient at the bedside and follow-up phone call with patient's daughter, Cassandra [name], who is listed as her healthcare power of attorney. Per patient and daughter, patient is married but her daughter has paperwork stating she is the healthcare POA.  Per daughter, Cassandra [name], she has healthcare power of attorney and is bringing a copy of this paperwork to the unit. It's unclear at this time if patient has the capacity to speak for herself. She is answering inconsistently, states she is confused. Patient is married, per daughter he has [place] and she has healthcare power of attorney  This is a very unfortunate and complex social situation as well. Patient appears depressed and acknowledges she is depressed. She denies that she is suicidal, but is very withdrawn, concentration poor, frequently answers "I don't know". Per Mrs. Edison Pace, patient has been profoundly debilitated over the past 2 years  with a perpetual cycle of coming into the hospital, going to a skilled nursing facility for rehabilitation, not participating in rehabilitation, being discharged home them very shortly back into the hospital. It is not clear at this point, per patient's daughter, whether she can return to [facility]. Her daughter is requesting residential hospice, but at this point patient likely has more than 2 weeks life expectancy. She would however qualify for her in home, hospice benefit which she can access in the facility if she was not using skilled days.    SUMMARY OF RECOMMENDATIONS   Continue full code for now. Patient has been a DO NOT RESUSCITATE per chart review of palliative care notes dated 07/03/2017, 07/04/2017 as well as 10/21/2016. Daughter reports it's her mother's wish to not be on any form of life support or to go through CPR or defibrillation Would recommend that we get a psychiatric consult to evaluate and hopefully treat patient depression as well as a formal capacity evaluation to ascertain if daughter is here healthcare proxy at this point Patient would qualify for her hospice benefit in a skilled nursing facility. Despite her many medical comorbidities she likely has a prognosis of more than 2 weeks and would not meet residential level of care  Code Status/Advance Care Planning:  Full  code    Symptom Management:   Depression: Patient is currently on Lexapro 20 mg daily. Would recommend patient be seen by psychiatry for additional medication recommendations, and/or increasing Lexapro to 30 mg daily; addition of Wellbutrin XL 150 mg, Provigil  Diarrhea: Patient on Imodium  Abdominal pain: Continue with Reglan  Palliative Prophylaxis:   Aspiration, Delirium Protocol, Eye Care, Frequent Pain Assessment, Oral Care and Turn Reposition  Additional Recommendations (Limitations, Scope, Preferences):  Full Scope Treatment  Psycho-social/Spiritual:   Desire for further Chaplaincy  support:no  Additional Recommendations: Referral to Community Resources   Prognosis:   < 6 months in the setting of functional debility, protein calorie malnutrition albumin 1.9, chronic kidney disease stage IV (patient is not a candidate for hemodialysis), recurrent hospitalizations, diabetic ketoacidosis  Discharge Planning: To Be Determined      Primary Diagnoses: Present on Admission: . DKA (diabetic ketoacidoses) ([place]) . Acute encephalopathy . Acute on chronic kidney failure ([place]) . Essential hypertension . Abdominal pain . HLD (hyperlipidemia)   I have reviewed the medical record, interviewed the patient and family, and examined the patient. The following aspects are pertinent.  Past Medical History:  Diagnosis Date  . Allergic rhinitis   . Anemia   . Anxiety   . Arthritis    "mostly feet, hands" (10/17/2016)  . Benign hypertension with CKD (chronic kidney disease) stage III   . Benign paroxysmal positional vertigo   . Bipolar disorder ([place])   . Broken finger   . Broken shoulder   . Broken toes   . Cervicalgia   . CHF (congestive heart failure) ([place]) 07/2016  . CKD (chronic kidney disease) stage 3, GFR 30-59 ml/min   . COPD (chronic obstructive pulmonary disease) ([place])   . Depression   . Elevated liver enzymes Hep B/C neg 2014  . Fall at nursing home 10/15/2016  . GERD (gastroesophageal reflux disease)   . Glaucoma   . High cholesterol   . History of alcohol use   . History of blood transfusion 09/2016   "blood got really really low"  . Hypertension   . Hypothyroidism   . Interstitial cystitis    bladder stretched every 9 months  . Migraines    "pretty much qd" (10/17/2016)  . Pneumonia 12/2016  . Psoriasis   . Stroke ([place]) 05/16/2016   Left occipital and thalamic, right hippocampal  . Thyroid disease   . Tobacco use   . 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 Vitamin B12 deficiency    Social History   Social History  . Marital  status: Married    Spouse name: N/A  . Number of children: N/A  . Years of education: N/A   Social History Main Topics  . Smoking status: Former Smoker    Packs/day: 0.25    Years: 10.00    Types: Cigarettes    Quit date: 12/1923  . Smokeless tobacco: Never Used     Comment: QUIT SMOKING 11/2016  . Alcohol use No     Comment: Hasn't had any alcohol "for 2 yrs" (10/17/2016)  . Drug use: No  . Sexual activity: Yes   Other Topics Concern  . None   Social History Narrative   Patient has been married for 13 years and lives with spouse, has 2 children, 3 grandchildren, and 1 great grandchild. She does not use walker/cane, does not clean house, does not shop, does not do yard work, and does not drive. She is retired. Previous  occupation was Product manager. Completed [employer].         Epwoth Sleepiness Scale Score:  7      --I have high blood pressure   --I have had insomnia   --I seem to be losing my sex drive   --I feel stressed and lack motivation   --I wake up to urinate frequently at night   --I wake up with a dry mouth or sore throat   --I feel excessively sleepy and tired throughout the day   --I have Diabetes   --I have been told that I snore   --I have problems with memory/concentration   --I frequently awake with headaches       Family History  Problem Relation Age of Onset  . Alcohol abuse Mother   . Arthritis Mother   . Asthma Mother   . Cancer Mother        colon cancer  . Hypertension Mother   . Migraines Mother   . Stroke Mother   . Lung disease Mother   . COPD Mother   . Diabetes Father   . Hypertension Father   . Heart disease Father   . Heart attack Father   . Heart disease Paternal Grandmother   . Diabetes Paternal Grandmother   . Stroke Paternal Grandmother   . Cancer Paternal Grandmother   . Diabetes Paternal Grandfather    Scheduled Meds: . amLODipine  10 mg Oral Daily  . atorvastatin  20 mg Oral q1800  . carvedilol  6.25 mg  Oral BID WC  . escitalopram  20 mg Oral BH-q7a  . famotidine  20 mg Oral Daily  . feeding supplement (PRO-STAT SUGAR FREE 64)  30 mL Oral TID  . Gerhardt's butt cream   Topical TID  . heparin  5,000 Units Subcutaneous Q8H  . insulin aspart  0-5 Units Subcutaneous TID WC  . insulin aspart  2 Units Subcutaneous Q breakfast  . insulin glargine  3 Units Subcutaneous Daily  . iron polysaccharides  150 mg Oral Daily  . levothyroxine  175 mcg Oral QAC breakfast  . saccharomyces boulardii  250 mg Oral BID  . sodium bicarbonate  1,300 mg Oral BID   Continuous Infusions: PRN Meds:.acetaminophen, hydrALAZINE, loperamide, metoCLOPramide Medications Prior to Admission:  Prior to Admission medications   Medication Sig Start Date End Date Taking? Authorizing Provider  Amino Acids-Protein Hydrolys (FEEDING SUPPLEMENT, PRO-STAT SUGAR FREE 64,) LIQD Take 30 mLs by mouth 3 (three) times daily. 06/20/17  Yes Johnson, Clanford L, MD  amLODipine (NORVASC) 5 MG tablet Take 1 tablet (5 mg total) by mouth daily. 05/15/17  Yes Patrecia Pour, Christean Grief, MD  atorvastatin (LIPITOR) 20 MG tablet Take 1 tablet (20 mg total) by mouth daily at 6 PM. 06/20/17  Yes Johnson, Clanford L, MD  blood glucose meter kit and supplies Dispense based on patient and insurance preference. Use up to four times daily as directed. (FOR ICD-9 250.00, 250.01). 07/05/17  Yes Dessa Phi Chahn-Yang, DO  carvedilol (COREG) 6.25 MG tablet Take 1 tablet (6.25 mg total) by mouth 2 (two) times daily with a meal. 06/20/17  Yes Johnson, Clanford L, MD  diphenhydrAMINE (BENADRYL) 25 MG tablet Take 2 tablets (50 mg total) by mouth every 6 (six) hours as needed for itching (tremors). 06/21/17  Yes Domenic Moras, PA-C  escitalopram (LEXAPRO) 20 MG tablet Take 1 tablet (20 mg total) by mouth every morning. 06/20/17  Yes Johnson, Clanford L, MD  furosemide (LASIX) 20  MG tablet Take 4 tablets (80 mg total) by mouth 2 (two) times daily. Patient taking differently: Take 40  mg by mouth 2 (two) times daily.  06/20/17  Yes Johnson, Clanford L, MD  Insulin Glargine (LANTUS) 100 UNIT/ML Solostar Pen Inject 7 Units into the skin daily. 07/05/17 08/17/17 Yes Choi, Anderson [place] Chahn-Yang, DO  insulin lispro (HUMALOG) 100 UNIT/ML injection 2 units PLUS sliding scale, three times daily with meals CBG 121 - 150: 1 units CBG 151 - 200: 2 units CBG 201 - 250: 3 units CBG 251 - 300: 5 units CBG 301 - 350: 7 units CBG 351 - 400: 9 units  Sliding every night (qhs)   CBG < 70: implement hypoglycemia protocol  CBG 70 - 120: 0 units  CBG 121 - 200: 0 units  CBG 201 - 250: 2 units  CBG 251 - 300: 3 units  CBG 301 - 350: 4 units  CBG 351 - 400: 5 units Patient taking differently: Inject 2 Units into the skin See admin instructions. 2 units with meals plus 0-69=initiate hypoglycemic protocol, 70-200=0 units, 201-250=2 units, 251-300=3 units, 301-350=4 units, 351-400=5 units, >400 notify MD 07/05/17  Yes Dessa Phi Chahn-Yang, DO  Insulin Pen Needle 31G X 5 MM MISC Use daily with insulin, up to 4 times daily 07/05/17  Yes Dessa Phi Chahn-Yang, DO  Insulin Syringe-Needle U-100 (INSULIN SYRINGE .3CC/31GX5/16") 31G X 5/16" 0.3 ML MISC Use daily with insulin 07/05/17  Yes Dessa Phi Chahn-Yang, DO  iron polysaccharides (NIFEREX) 150 MG capsule Take 1 capsule (150 mg total) by mouth daily. 06/20/17  Yes Johnson, Clanford L, MD  levothyroxine (SYNTHROID, LEVOTHROID) 175 MCG tablet Take 1 tablet (175 mcg total) by mouth daily before breakfast. 01/07/17  Yes Sudini, Alveta Heimlich, MD  loperamide (IMODIUM) 2 MG capsule Take 4 mg by mouth 2 (two) times daily as needed for diarrhea or loose stools.   Yes [provider]  losartan (COZAAR) 50 MG tablet Take 50 mg by mouth daily.   Yes [provider]  metoCLOPramide (REGLAN) 5 MG tablet TK 1 T PO TID PRN FOR ABDOMINAL CRAMPING 05/14/17  Yes [provider]  Nutritional Supplements (FEEDING SUPPLEMENT, NEPRO CARB STEADY,) LIQD  Take 237 mLs by mouth at bedtime. 06/20/17  Yes Johnson, Clanford L, MD  ondansetron (ZOFRAN) 4 MG tablet Take 4 mg by mouth every 8 (eight) hours as needed for nausea or vomiting.   Yes [provider]  pantoprazole (PROTONIX) 40 MG tablet Take 40 mg by mouth daily.   Yes [provider]  potassium chloride SA (K-DUR,KLOR-CON) 20 MEQ tablet Take 1 tablet (20 mEq total) by mouth daily. 06/20/17  Yes Johnson, Clanford L, MD  triamcinolone (KENALOG) 0.025 % cream Apply 1 application topically 2 (two) times daily.   Yes [provider]   Allergies  Allergen Reactions  . Alprazolam Other (See Comments)    Family preference, for patient to not take med  . Percocet [Oxycodone-Acetaminophen] Other (See Comments)    Family preference, for patient to not take med  . Codeine Diarrhea and Nausea And Vomiting  . Doxycycline Diarrhea and Nausea And Vomiting  . Hydrocodone Nausea And Vomiting  . Omnicef [Cefdinir] Nausea Only and Other (See Comments)    Constipation, tolerated Zosyn  . Tramadol Other (See Comments)    Pt hallucinates when taking medication. Family request not to give medication to pt   . Augmentin [Amoxicillin-Pot Clavulanate] Hives and Rash    Has patient had a PCN reaction  causing immediate rash, facial/tongue/throat swelling, SOB or lightheadedness with hypotension: Yes Has patient had a PCN reaction causing severe rash involving mucus membranes or skin necrosis: Yes Did a PCN reaction that required hospitalization No Did PCN reaction occurring within the last 10 years: Yes If all of the above answers are "NO", then may proceed with Cephalosporin use.  Pt states she has taken penicillin since, and was ok with it...  . Ciprofloxacin Hives    Tolerated LVQ in 12/2016   Review of Systems  Unable to perform ROS: Psychiatric disorder    Physical Exam  Constitutional:  Acutely ill appearing female in no acute distress  HENT:  Head: Normocephalic and  atraumatic.  Neck: Normal range of motion.  Cardiovascular: Normal rate.   Pulmonary/Chest: Effort normal.  Abdominal: Soft.  Skin: Skin is warm and dry.  Eczema-like rash noted to lower extremities Skin breakdown peri area and buttocks; excoriated  Psychiatric:  Affect blunted patient endorsing depression without suicidal ideation Withdrawn; concentration poor, frequently answering "I don't know, I don't know"  Nursing note and vitals reviewed.   Vital Signs: BP (!) 152/72 (BP Location: Left Arm)   Pulse 68   Temp 98.4 F (36.9 C) (Oral)   Resp 15   Ht 5' 2" (1.575 m)   Wt 48.8 kg (107 lb 9.4 oz)   SpO2 100%   BMI 19.68 kg/m  Pain Assessment: No/denies pain POSS *See Group Information*: 2-Acceptable,Slightly drowsy, easily aroused Pain Score: 0-No pain   SpO2: SpO2: 100 % O2 Device:SpO2: 100 % O2 Flow Rate: .   IO: Intake/output summary:  Intake/Output Summary (Last 24 hours) at 07/27/17 1427 Last data filed at 07/27/17 6962  Gross per 24 hour  Intake              320 ml  Output                0 ml  Net              320 ml    LBM: Last BM Date: 07/26/17 Baseline Weight: Weight: 47.6 kg (105 lb) Most recent weight: Weight: 48.8 kg (107 lb 9.4 oz)     Palliative Assessment/Data:   Flowsheet Rows     Most Recent Value  Intake Tab  Referral Department  Hospitalist  Unit at Time of Referral  Intermediate Care Unit  Palliative Care Primary Diagnosis  Nephrology  Date Notified  07/27/17  Palliative Care Type  Return patient Palliative Care  Reason for referral  Clarify Goals of Care, Counsel Regarding Hospice  Date of Admission  07/22/17  Date first seen by Palliative Care  07/27/17  # of days Palliative referral response time  0 Day(s)  # of days IP prior to Palliative referral  5  Clinical Assessment  Palliative Performance Scale Score  30%  Pain Max last 24 hours  Not able to report  Pain Min Last 24 hours  Not able to report  Dyspnea Max Last 24 Hours   Not able to report  Dyspnea Min Last 24 hours  Not able to report  Nausea Max Last 24 Hours  Not able to report  Nausea Min Last 24 Hours  Not able to report  Anxiety Max Last 24 Hours  Not able to report  Anxiety Min Last 24 Hours  Not able to report  Other Max Last 24 Hours  Not able to report  Psychosocial & Spiritual Assessment  Palliative Care Outcomes  Patient/Family meeting held?  Yes  Who was at the meeting?  pt, pt's daughter, Cassandra [name] via phone  Palliative Care follow-up planned  Yes, Facility      Time In: 1300 Time Out: 1410 Time Total: 70 min Greater than 50%  of this time was spent counseling and coordinating care related to the above assessment and plan. Staffed with Dr. Algis Liming  Signed by: Dory Horn, NP   Please contact Palliative Medicine Team phone at [phone] for questions and concerns.  For individual provider: See Shea Evans

## 2017-07-28 DIAGNOSIS — A0472 Enterocolitis due to Clostridium difficile, not specified as recurrent: Secondary | ICD-10-CM

## 2017-07-28 DIAGNOSIS — R109 Unspecified abdominal pain: Secondary | ICD-10-CM

## 2017-07-28 DIAGNOSIS — F332 Major depressive disorder, recurrent severe without psychotic features: Secondary | ICD-10-CM

## 2017-07-28 DIAGNOSIS — Z811 Family history of alcohol abuse and dependence: Secondary | ICD-10-CM

## 2017-07-28 DIAGNOSIS — E1310 Other specified diabetes mellitus with ketoacidosis without coma: Secondary | ICD-10-CM

## 2017-07-28 DIAGNOSIS — R55 Syncope and collapse: Secondary | ICD-10-CM

## 2017-07-28 DIAGNOSIS — Z599 Problem related to housing and economic circumstances, unspecified: Secondary | ICD-10-CM

## 2017-07-28 DIAGNOSIS — I5032 Chronic diastolic (congestive) heart failure: Secondary | ICD-10-CM

## 2017-07-28 DIAGNOSIS — Z87891 Personal history of nicotine dependence: Secondary | ICD-10-CM

## 2017-07-28 DIAGNOSIS — Z515 Encounter for palliative care: Secondary | ICD-10-CM

## 2017-07-28 LAB — BASIC METABOLIC PANEL
ANION GAP: 9 (ref 5–15)
BUN: 83 mg/dL — ABNORMAL HIGH (ref 6–20)
CALCIUM: 8.1 mg/dL — AB (ref 8.9–10.3)
CO2: 23 mmol/L (ref 22–32)
Chloride: 107 mmol/L (ref 101–111)
Creatinine, Ser: 3.18 mg/dL — ABNORMAL HIGH (ref 0.44–1.00)
GFR, EST AFRICAN AMERICAN: 16 mL/min — AB (ref >60)
GFR, EST NON AFRICAN AMERICAN: 14 mL/min — AB (ref >60)
GLUCOSE: 139 mg/dL — AB (ref 65–99)
POTASSIUM: 4.4 mmol/L (ref 3.5–5.1)
Sodium: 139 mmol/L (ref 135–145)

## 2017-07-28 LAB — CBC
HEMATOCRIT: 23.1 % — AB (ref 36.0–46.0)
Hemoglobin: 7.8 g/dL — ABNORMAL LOW (ref 12.0–15.0)
MCH: 28.2 pg (ref 26.0–34.0)
MCHC: 33.8 g/dL (ref 30.0–36.0)
MCV: 83.4 fL (ref 78.0–100.0)
PLATELETS: 249 10*3/uL (ref 150–400)
RBC: 2.77 MIL/uL — AB (ref 3.87–5.11)
RDW: 14.4 % (ref 11.5–15.5)
WBC: 10 10*3/uL (ref 4.0–10.5)

## 2017-07-28 LAB — GLUCOSE, CAPILLARY
GLUCOSE-CAPILLARY: 146 mg/dL — AB (ref 65–99)
GLUCOSE-CAPILLARY: 336 mg/dL — AB (ref 65–99)
GLUCOSE-CAPILLARY: 282 mg/dL — AB (ref 65–99)
Glucose-Capillary: 259 mg/dL — ABNORMAL HIGH (ref 65–99)

## 2017-07-28 MED ORDER — INSULIN ASPART 100 UNIT/ML ~~LOC~~ SOLN
3.0000 [IU] | Freq: Every day | SUBCUTANEOUS | Status: DC
  Administered 2017-07-28 – 2017-07-29 (×2): 3 [IU] via SUBCUTANEOUS

## 2017-07-28 MED ORDER — SODIUM BICARBONATE 650 MG PO TABS
650.0000 mg | ORAL_TABLET | Freq: Two times a day (BID) | ORAL | Status: DC
  Administered 2017-07-28 – 2017-07-30 (×4): 650 mg via ORAL
  Filled 2017-07-28 (×4): qty 1

## 2017-07-28 MED ORDER — BUPROPION HCL ER (SR) 100 MG PO TB12
100.0000 mg | ORAL_TABLET | Freq: Every day | ORAL | Status: DC
  Administered 2017-07-28 – 2017-07-30 (×3): 100 mg via ORAL
  Filled 2017-07-28 (×3): qty 1

## 2017-07-28 NOTE — Consult Note (Signed)
[facility] Face-to-Face Psychiatry Consult   Reason for Consult:  Capacity evaluation and depression and anxiety Referring Physician:  Dr. Algis Liming Patient Identification: Cassandra [name] MRN:  [id number] Principal Diagnosis: Depression Diagnosis:   Patient Active Problem List   Diagnosis Date Noted  . Palliative care by specialist [Z51.5]   . DKA (diabetic ketoacidoses) ([place]) [E13.10] 07/22/2017  . Chronic diastolic CHF (congestive heart failure) ([place]) [I50.32] 07/22/2017  . Syncope [R55] 07/01/2017  . C. difficile colitis [A04.72] 07/01/2017  . Acute renal failure (ARF) ([place]) [N17.9] 06/14/2017  . Malnutrition of moderate degree [E44.0] 06/13/2017  . Acute renal failure ([place]) [N17.9] 06/13/2017  . CKD stage 4 due to type 2 diabetes mellitus ([place]) [Q25.95, N18.4] 06/12/2017  . Anasarca associated with disorder of kidney [N04.9] 06/12/2017  . Nephrotic syndrome due to type 2 diabetes mellitus ([place]) [E11.21] 06/12/2017  . Anasarca [R60.1] 05/09/2017  . Nausea vomiting and diarrhea [R11.2, R19.7] 03/08/2017  . Constipation [K59.00]   . Abdominal pain [R10.9]   . Fecal impaction ([place]) [K56.41] 01/04/2017  . HAP (hospital-acquired pneumonia) [J18.9] 12/23/2016  . HCAP (healthcare-associated pneumonia) [J18.9]   . Type 2 diabetes mellitus with hypoglycemia without coma ([place]) [E11.649]   . Hypoglycemia [E16.2] 12/03/2016  . Palliative care encounter [Z51.5]   . Goals of care, counseling/discussion [Z71.89]   . Cervical vertebral fracture (HCC) [S12.9XXA] 10/17/2016  . Thoracic vertebral fracture ([place]) [S22.009A] 10/17/2016  . Fall at nursing home [phone].Merril Abbe, Y92.129] 10/17/2016  . Common bile duct dilatation [K83.8] 10/17/2016  . Closed fracture of cervical vertebra ([place]) [S12.9XXA]   . Recent Gram-negative bacteremia [R78.81] 10/07/2016  . Hyperkalemia [E87.5]   . Gastroesophageal reflux disease [K21.9]   . DKA, type 1 ([place]) [E10.10] 09/06/2016  . Protein-calorie malnutrition, severe  [E43] 08/28/2016  . AKI (acute kidney injury) ([place]) [N17.9] 07/19/2016  . Acute on chronic diastolic CHF (congestive heart failure) ([place]) [I50.33] 07/19/2016  . Dehydration [E86.0]   . Cerebral embolism with cerebral infarction [I63.40] 07/13/2016  . Left ventricular diastolic dysfunction, NYHA class 1 [I51.9] 07/11/2016  . History of CVA (cerebrovascular accident) [Z86.73]   . Essential hypertension [I10]   . HLD (hyperlipidemia) [E78.5]   . Anemia of chronic disease [D63.8]   . Physical deconditioning [R53.81]   . Acute on chronic kidney failure ([place]) [N17.9, N18.9]   . Diabetic ketoacidosis with coma associated with type 1 diabetes mellitus ([place]) [E10.11]   . Cerebral thrombosis with cerebral infarction [I63.30] 05/15/2016  . Generalized anxiety disorder [F41.1] 05/10/2016  . Diabetic ketoacidosis without coma associated with type 1 diabetes mellitus ([place]) [E10.10] 05/06/2016  . Acute encephalopathy [G93.40] 05/06/2016  . Insulin dependent type 2 diabetes mellitus, uncontrolled (HCC) [E11.65, Z79.4]   . Depression [F32.9]   . CKD (chronic kidney disease) stage 3, GFR 30-59 ml/min [N18.3]   . Uncontrolled Hypothyroidism [E03.9]   . Vitamin B12 deficiency [E53.8]   . Benign paroxysmal positional vertigo [H81.10]   . Anxiety [F41.9]   . Allergic rhinitis [J30.9]   . Glaucoma [H40.9]   . Benign hypertension with CKD (chronic kidney disease) stage III [I12.9, N18.3]   . Tobacco abuse [Z72.0]   . Cervicalgia [M54.2]   . Elevated liver enzymes [R74.8]   . History of alcohol use [Z87.898]     Total Time spent with patient: 1 hour  Subjective:   Cassandra [name] is a 71 y.o. female patient admitted with brittle diabetes, depression and anxiety.  HPI: Cassandra [name] is a 71 year old married female with multiple medical problems and  frequent in patient hospitalization admitted for depression, lack of energy, motivation, and more dependence on her husband. She has wish to get better  health and able to feel like herself and wants to spend time with her two daughter's and grand children. She is worried about money but not able to give details. She does know her first name, last name, DOB and unable to orient to her current year, date, day or place. She knows she has DM and syncopal episodes but could not remember other medical problems. She had poor concentration and quick to give up and tell "I don't remember".  She has no family members at bed side. Based on evaluation. Patient has limited cognitions, understanding about her health conditions and required treatment needs. She is depends on her husband for daily needs.  she does not meet criteria for capacity to make medical decision or living arrangements. Patient will be seen by palliative care soon for goals of care.  Past Psychiatric History: Patient was seen by psych consultation on May 10, 2016 for capacity  And treatment of depression and anxiety but has no out patient or inpatient psych treatments.  Risk to Self: Is patient at risk for suicide?: No Risk to Others:   Prior Inpatient Therapy:   Prior Outpatient Therapy:    Past Medical History:  Past Medical History:  Diagnosis Date  . Allergic rhinitis   . Anemia   . Anxiety   . Arthritis    "mostly feet, hands" (10/17/2016)  . Benign hypertension with CKD (chronic kidney disease) stage III   . Benign paroxysmal positional vertigo   . Bipolar disorder ([place])   . Broken finger   . Broken shoulder   . Broken toes   . Cervicalgia   . CHF (congestive heart failure) ([place]) 07/2016  . CKD (chronic kidney disease) stage 3, GFR 30-59 ml/min   . COPD (chronic obstructive pulmonary disease) ([place])   . Depression   . Elevated liver enzymes Hep B/C neg 2014  . Fall at nursing home 10/15/2016  . GERD (gastroesophageal reflux disease)   . Glaucoma   . High cholesterol   . History of alcohol use   . History of blood transfusion 09/2016   "blood got really really low"  .  Hypertension   . Hypothyroidism   . Interstitial cystitis    bladder stretched every 9 months  . Migraines    "pretty much qd" (10/17/2016)  . Pneumonia 12/2016  . Psoriasis   . Stroke ([place]) 05/16/2016   Left occipital and thalamic, right hippocampal  . Thyroid disease   . Tobacco use   . 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 Vitamin B12 deficiency     Past Surgical History:  Procedure Laterality Date  . ANTERIOR CERVICAL DECOMP/DISCECTOMY FUSION  ~ 2009  . APPENDECTOMY    . BACK SURGERY    . BLADDER SUSPENSION    . CATARACT EXTRACTION W/ INTRAOCULAR LENS  IMPLANT, BILATERAL Bilateral   . DILATION AND CURETTAGE OF UTERUS    . HERNIA REPAIR    . LAPAROSCOPIC CHOLECYSTECTOMY    . TONSILLECTOMY    . TUBAL LIGATION    . VAGINAL HYSTERECTOMY     with oophorectomy   Family History:  Family History  Problem Relation Age of Onset  . Alcohol abuse Mother   . Arthritis Mother   . Asthma Mother   . Cancer Mother        colon cancer  .  Hypertension Mother   . Migraines Mother   . Stroke Mother   . Lung disease Mother   . COPD Mother   . Diabetes Father   . Hypertension Father   . Heart disease Father   . Heart attack Father   . Heart disease Paternal Grandmother   . Diabetes Paternal Grandmother   . Stroke Paternal Grandmother   . Cancer Paternal Grandmother   . Diabetes Paternal Grandfather    Family Psychiatric  History: denied Social History:  History  Alcohol Use No    Comment: Hasn't had any alcohol "for 2 yrs" (10/17/2016)     History  Drug Use No    Social History   Social History  . Marital status: Married    Spouse name: N/A  . Number of children: N/A  . Years of education: N/A   Social History Main Topics  . Smoking status: Former Smoker    Packs/day: 0.25    Years: 10.00    Types: Cigarettes    Quit date: 12/1923  . Smokeless tobacco: Never Used     Comment: QUIT SMOKING 11/2016  . Alcohol use No     Comment: Hasn't had  any alcohol "for 2 yrs" (10/17/2016)  . Drug use: No  . Sexual activity: Yes   Other Topics Concern  . None   Social History Narrative   Patient has been married for 13 years and lives with spouse, has 2 children, 3 grandchildren, and 1 great grandchild. She does not use walker/cane, does not clean house, does not shop, does not do yard work, and does not drive. She is retired. Previous occupation was Product manager. Completed [employer].         Epwoth Sleepiness Scale Score:  7      --I have high blood pressure   --I have had insomnia   --I seem to be losing my sex drive   --I feel stressed and lack motivation   --I wake up to urinate frequently at night   --I wake up with a dry mouth or sore throat   --I feel excessively sleepy and tired throughout the day   --I have Diabetes   --I have been told that I snore   --I have problems with memory/concentration   --I frequently awake with headaches       Additional Social History:    Allergies:   Allergies  Allergen Reactions  . Alprazolam Other (See Comments)    Family preference, for patient to not take med  . Percocet [Oxycodone-Acetaminophen] Other (See Comments)    Family preference, for patient to not take med  . Codeine Diarrhea and Nausea And Vomiting  . Doxycycline Diarrhea and Nausea And Vomiting  . Hydrocodone Nausea And Vomiting  . Omnicef [Cefdinir] Nausea Only and Other (See Comments)    Constipation, tolerated Zosyn  . Tramadol Other (See Comments)    Pt hallucinates when taking medication. Family request not to give medication to pt   . Augmentin [Amoxicillin-Pot Clavulanate] Hives and Rash    Has patient had a PCN reaction causing immediate rash, facial/tongue/throat swelling, SOB or lightheadedness with hypotension: Yes Has patient had a PCN reaction causing severe rash involving mucus membranes or skin necrosis: Yes Did a PCN reaction that required hospitalization No Did PCN reaction occurring  within the last 10 years: Yes If all of the above answers are "NO", then may proceed with Cephalosporin use.  Pt states she has taken penicillin since, and  was ok with it...  . Ciprofloxacin Hives    Tolerated LVQ in 12/2016    Labs:  Results for orders placed or performed during the hospital encounter of 07/22/17 (from the past 48 hour(s))  Glucose, capillary     Status: Abnormal   Collection Time: 07/26/17  4:27 PM  Result Value Ref Range   Glucose-Capillary 43 (LL) 65 - 99 mg/dL  Glucose, capillary     Status: None   Collection Time: 07/26/17  5:16 PM  Result Value Ref Range   Glucose-Capillary 94 65 - 99 mg/dL  Glucose, capillary     Status: Abnormal   Collection Time: 07/26/17  9:30 PM  Result Value Ref Range   Glucose-Capillary 438 (H) 65 - 99 mg/dL  Glucose, capillary     Status: Abnormal   Collection Time: 07/27/17  8:00 AM  Result Value Ref Range   Glucose-Capillary 378 (H) 65 - 99 mg/dL  Glucose, capillary     Status: Abnormal   Collection Time: 07/27/17 11:36 AM  Result Value Ref Range   Glucose-Capillary 239 (H) 65 - 99 mg/dL  Glucose, capillary     Status: Abnormal   Collection Time: 07/27/17  5:19 PM  Result Value Ref Range   Glucose-Capillary 285 (H) 65 - 99 mg/dL  Glucose, capillary     Status: Abnormal   Collection Time: 07/27/17  8:24 PM  Result Value Ref Range   Glucose-Capillary 232 (H) 65 - 99 mg/dL   Comment 1 Notify RN   CBC     Status: Abnormal   Collection Time: 07/28/17  2:56 AM  Result Value Ref Range   WBC 10.0 4.0 - 10.5 K/uL   RBC 2.77 (L) 3.87 - 5.11 MIL/uL   Hemoglobin 7.8 (L) 12.0 - 15.0 g/dL   HCT 98.2 (L) 96.7 - 00.0 %   MCV 83.4 78.0 - 100.0 fL   MCH 28.2 26.0 - 34.0 pg   MCHC 33.8 30.0 - 36.0 g/dL   RDW 47.6 73.7 - 84.5 %   Platelets 249 150 - 400 K/uL  Basic metabolic panel     Status: Abnormal   Collection Time: 07/28/17  2:56 AM  Result Value Ref Range   Sodium 139 135 - 145 mmol/L   Potassium 4.4 3.5 - 5.1 mmol/L    Chloride 107 101 - 111 mmol/L   CO2 23 22 - 32 mmol/L   Glucose, Bld 139 (H) 65 - 99 mg/dL   BUN 83 (H) 6 - 20 mg/dL   Creatinine, Ser 3.06 (H) 0.44 - 1.00 mg/dL   Calcium 8.1 (L) 8.9 - 10.3 mg/dL   GFR calc non Af Amer 14 (L) >60 mL/min   GFR calc Af Amer 16 (L) >60 mL/min    Comment: (NOTE) The eGFR has been calculated using the CKD EPI equation. This calculation has not been validated in all clinical situations. eGFR's persistently <60 mL/min signify possible Chronic Kidney Disease.    Anion gap 9 5 - 15  Glucose, capillary     Status: Abnormal   Collection Time: 07/28/17  8:05 AM  Result Value Ref Range   Glucose-Capillary 259 (H) 65 - 99 mg/dL  Glucose, capillary     Status: Abnormal   Collection Time: 07/28/17 12:04 PM  Result Value Ref Range   Glucose-Capillary 146 (H) 65 - 99 mg/dL    Current Facility-Administered Medications  Medication Dose Route Frequency Provider Last Rate Last Dose  . acetaminophen (TYLENOL) tablet 650 mg  650 mg  Oral Q6H PRN Elease Etienne, MD      . amLODipine (NORVASC) tablet 10 mg  10 mg Oral Daily Elease Etienne, MD   10 mg at 07/28/17 [phone]  . atorvastatin (LIPITOR) tablet 20 mg  20 mg Oral q1800 Ozella Rocks, MD   20 mg at 07/27/17 1747  . carvedilol (COREG) tablet 6.25 mg  6.25 mg Oral BID WC Elease Etienne, MD   6.25 mg at 07/28/17 0839  . escitalopram (LEXAPRO) tablet 20 mg  20 mg Oral Gwynneth Munson, MD   20 mg at 07/28/17 0630  . famotidine (PEPCID) tablet 20 mg  20 mg Oral Daily Elease Etienne, MD   20 mg at 07/28/17 [phone]  . feeding supplement (PRO-STAT SUGAR FREE 64) liquid 30 mL  30 mL Oral TID Ozella Rocks, MD   30 mL at 07/28/17 0840  . Gerhardt's butt cream   Topical TID Elease Etienne, MD      . heparin injection 5,000 Units  5,000 Units Subcutaneous Q8H Ozella Rocks, MD   5,000 Units at 07/28/17 1309  . hydrALAZINE (APRESOLINE) injection 5-10 mg  5-10 mg Intravenous Q4H PRN Ozella Rocks, MD    5 mg at 07/28/17 6225  . insulin aspart (novoLOG) injection 0-5 Units  0-5 Units Subcutaneous TID WC Elease Etienne, MD   3 Units at 07/28/17 [phone]  . insulin aspart (novoLOG) injection 3 Units  3 Units Subcutaneous Q breakfast Elease Etienne, MD   3 Units at 07/28/17 0840  . insulin glargine (LANTUS) injection 3 Units  3 Units Subcutaneous Daily Elease Etienne, MD   3 Units at 07/28/17 [phone]  . iron polysaccharides (NIFEREX) capsule 150 mg  150 mg Oral Daily Ozella Rocks, MD   150 mg at 07/28/17 0839  . levothyroxine (SYNTHROID, LEVOTHROID) tablet 175 mcg  175 mcg Oral QAC breakfast Ozella Rocks, MD   175 mcg at 07/28/17 [phone]  . loperamide (IMODIUM) capsule 2 mg  2 mg Oral BID PRN Elease Etienne, MD   2 mg at 07/27/17 2312  . metoCLOPramide (REGLAN) tablet 5 mg  5 mg Oral TID PRN Ozella Rocks, MD   5 mg at 07/28/17 0858  . saccharomyces boulardii (FLORASTOR) capsule 250 mg  250 mg Oral BID Elease Etienne, MD   250 mg at 07/28/17 5587  . sodium bicarbonate tablet 650 mg  650 mg Oral BID Hongalgi, Maximino Greenland, MD        Musculoskeletal: Strength & Muscle Tone: decreased Gait & Station: unable to stand Patient leans: N/A  Psychiatric Specialty Exam: Physical Exam as per history and physical  ROS generalized weakness, depression and forgetful. Has brittle DM, bad stomach and syncope episodes.  No Fever-chills, No Headache, No changes with Vision or hearing, reports vertigo No problems swallowing food or Liquids, No Chest pain, Cough or Shortness of Breath, No Abdominal pain, No Nausea or Vommitting, Bowel movements are regular, No Blood in stool or Urine, No dysuria, No new skin rashes or bruises, No new joints pains-aches,  No new weakness, tingling, numbness in any extremity, No recent weight gain or loss, No polyuria, polydypsia or polyphagia,   A full 10 point Review of Systems was done, except as stated above, all other Review of Systems were negative.   Blood pressure (!) 132/56, pulse 72, temperature 98.6 F (37 C), temperature source Oral, resp. rate 16, height 5\' 2"  (1.575 m), weight  48.9 kg (107 lb 12.8 oz), SpO2 100 %.Body mass index is 19.72 kg/m.  General Appearance: Casual  Eye Contact:  Good  Speech:  Clear and Coherent  Volume:  Decreased  Mood:  Depressed  Affect:  Constricted and Depressed  Thought Process:  Coherent and Goal Directed  Orientation:  Full (Time, Place, and Person)  Thought Content:  Logical  Suicidal Thoughts:  No  Homicidal Thoughts:  No  Memory:  Immediate;   Good Recent;   Fair Remote;   Good  Judgement:  Fair  Insight:  Fair  Psychomotor Activity:  Decreased  Concentration:  Concentration: Fair and Attention Span: Fair  Recall:  [employer] of Knowledge:  Good  Language:  Good  Akathisia:  Negative  Handed:  Right  AIMS (if indicated):     Assets:  Communication Skills Desire for Improvement Financial Resources/Insurance Housing Leisure Time Resilience Social Support  ADL's:  Impaired  Cognition:  Impaired,  Mild  Sleep:        Treatment Plan Summary: Daily contact with patient to assess and evaluate symptoms and progress in treatment and Medication management   Patient does not have capacity to make medial decisions or living arrangements and not able to remember her multiple complex and complicated medical conditions and needed treatment decisions.   Will add Wellbutrin SR 100 mg daily morning as a booster for her depression Continue Lexapro 20 mg daily for depression - tolerating over one year and has some response  Disposition: No evidence of imminent risk to self or others at present.   Supportive therapy provided about ongoing stressors.  Ambrose Finland, MD 07/28/2017 1:31 PM

## 2017-07-28 NOTE — Progress Notes (Addendum)
PROGRESS NOTE   Cassandra [name]  GNF:[id number]    DOB: 28-Dec-1945    DOA: 07/22/2017  PCP: Henrine Screws, MD   I have briefly reviewed patients previous medical records in [facility].  Brief Narrative:  71 year old female with PMH of DM 2/IDDM, recurrent episodes of DKA, brittle DM, stage IV chronic kidney disease, nephrotic syndrome, HTN, HLD, chronic anemia, C. difficile colitis, multiple recent hospitalizations (5 in the last 6 months), most recently 7/17-7/21 when she presented with syncope within 12 hours after discharge from SNF and found to be hypoglycemic with blood sugars in the 30s, sent to ED from SNF on 07/22/17 due to CBGs in the 200s over the last couple of weeks which increased to 500's on day of admission, did not respond adequately to insulin's and fluids, abdominal discomfort, nausea and ongoing diarrhea, noted to be in DKA and admitted to stepdown on insulin drip protocol, became hypoglycemic same night, insulin drip discontinued. Nephrology consulted 8/8 to assist with CKD. Adjusting insulins due to very brittle DM.Ongoing issues with hyperglycemia and hypoglycemia.Palliative care consulted for goals of care and requested psychiatry input regarding capacity to make medical decisions and depression evaluation. Discharge disposition pending.   Assessment & Plan:   Active Problems:   Acute encephalopathy   Essential hypertension   HLD (hyperlipidemia)   Acute on chronic kidney failure (HCC)   Abdominal pain   DKA (diabetic ketoacidoses) (HCC)   Chronic diastolic CHF (congestive heart failure) (HCC)   Palliative care by specialist   1. DKA in type II DM, not at goal/recurrent DKA/brittle DM: Presented with glucose 519, bicarbonate 12, anion gap 23. Placed initially on insulin drip per protocol. Became hypoglycemic on the same night of admission and insulin drip was discontinued without starting Lantus. Blood glucose this morning 433. Anion gap has closed: 13.  Bicarbonate 16, likely related to chronic kidney disease. Unable to give significant IV fluids due to chronic kidney disease. Resumed home Lantus 7 units daily, NovoLog sensitive SSI with bedtime scale and added NovoLog mealtime 2 units. Reported issues with poor compliance with insulin's at home. A1c: 9.6. Has been having ongoing issues with hyperglycemia in the mornings (400s) and hypoglycemia in the evenings (40s). Reduced Lantus to 3 units daily, changed NovoLog SSI to very sensitive customized scale, discontinued bedtime sliding scale. Again had evening hypoglycemia on 8/11. Changed NovoLog from 2 units TIDWC to 2 units with breakfast only. Monitor closely. Difficult management. CBGs mostly in the 200s yesterday. Increase NovoLog to 3 units daily with breakfast. 2. Acute on Stage IV chronic kidney disease/nephrotic syndrome: Secondary to poorly controlled DM and HTN. Baseline creatinine probably in the 2.7-2.9 range. Creatinine at recent discharge on 7/21:3.2. Presented with creatinine of 4.11. Acute kidney injury likely related to dehydration from poor oral intake, GI losses and hyperglycemia. Brief and gentle IV fluids. Nephrology consultation and follow-up appreciated >creatinine has improved and stabilized but CKD progressive over past year and she will eventually reach ESRD but not candidate for RRT for multiple reasons indicated in their note and at that time by palliative approach will be most appropriate. Nephrology signed off 8/9. Creatinine improved to 3.18 and periodically follow up as outpatient/SNF. 3. Non-anion gap metabolic acidosis: Likely related to chronic kidney disease. Nephrology consulted and started sodium bicarbonate 1300 MG twice a day. Nephrology recommends continuing sodium bicarbonate. Bicarbonate has normalized. Reduce sodium bicarbonate tablets.  4. Acute toxic metabolic encephalopathy: Likely secondary to multiple acute metabolic abnormalities. No clear infectious etiology.  Abdominal and chest x-ray without acute findings. Resolved. Mental status back to baseline.Stable.  5. Abdominal pain/diarrhea: Recent C. difficile colitis. Repeat C. difficile testing negative. KUB negative for acute findings.? Diabetic gastroparesis. Supportive treatment. Added probiotics. Intermittent 2-3 loose/soft BMs per day. When necessary Imodium.Has had couple of CTs earlier this year which have shown stercoral proctitis. No acute findings on clinical exam. Ongoing intermittent abdominal pain/cramping and loose stools. Add Levsin. Unable to say when she last had Gi work up. Consulted [facility]? Colonoscopy.  6. Essential hypertension: Mildly uncontrolled. Resumed carvedilol. Hold Lasix, HCTZ and Cozaar. Monitor. Mildly uncontrolled. Increased amlodipine to 10 MG daily. No change in treatment. 7. Hyperlipidemia: Continue statins. 8. Hypothyroid: Continue levothyroxine. 9. Depression: No SI/HI. Continue Lexapro.Requested psychiatry consultation on 8/13.  10. Chronic diastolic CHF: Euvolemic clinically. Holding diuretics.Marland Kitchen. 11. GERD: Holding PPI. Started Pepcid. 12. Normocytic anemia: Likely related to chronic kidney disease. Hemoglobin fluctuating/7.8 today. No overt bleeding reported. Anemia panel 06/2017: Iron:50, B12: 343, folate 12.1, ferritin 133.? Element of iron deficiency. Continue iron supplements. Follow CBCs closely and consider transfusion if hemoglobin is 7 or less. Hemoglobin stable in the 7.8 range over the last 4 days.  13. Adult failure to thrive: Likely multifactorial. Social work input appreciated. Difficult social situation. As per daughter's report, spouse not being supportive, has left SNF in the past prior to time, owes money to SNF and spouse currently not reachable, daughter unable to take care of patient. Discussed with palliative care on 8/12 and their input appreciated. They recommended getting psychiatric consultation for capacity evaluation and addressing depression,  consulted called.   D.VT prophylaxis: Subcutaneous heparin Code Status: Full Family Communication: None at bedside today.  Disposition: Admitted to stepdown unit. Transferred to medical bed 8/12. Discharge disposition to be determined pending improvement, palliative/psychiatry and GI input.   Consultants:  Nephrology -signed off. Palliative care team Psychiatry-pending [facility]/pending  Procedures:  None  Antimicrobials:  None    Subjective: Ongoing intermittent abdominal pain/cramping, fluctuating and poor oral intake, reported loose stools but better after Imodium.  ROS: Denies chest pain, dyspnea, dizziness or lightheadedness. No bleeding reported.  Objective:  Vitals:   07/28/17 0620 07/28/17 0628 07/28/17 0727 07/28/17 0830  BP: (!) 193/78  (!) 169/69 (!) 180/82  Pulse: 80  82 83  Resp: 16     Temp: 98.5 F (36.9 C)     TempSrc: Oral     SpO2: 97%     Weight:  48.9 kg (107 lb 12.8 oz)    Height:        Examination:  General exam: Pleasant middle-aged female, small built, frail, chronically ill looking, lying comfortably supine in bed. Oral mucosa moist. Stable without change.  Respiratory system: Clear to auscultation. Respiratory effort normal. Stable without change. Cardiovascular system: S1 and S2 heard, RRR. No JVD, murmurs or pedal edema. Stable Gastrointestinal system: Abdomen is nondistended, soft and nontender. No organomegaly or masses felt. Normal bowel sounds heard. No acute findings. Central nervous system: Alert and oriented 2. No focal neurological deficits. Stable Extremities: Symmetric 5 x 5 power. Skin: As examined with a female RN chaperone at bedside on 8/9, perianal maceration related to stooling. Topical barrier cream. Psychiatry: Judgement and insight impaired. Mood & affect flat    Data Reviewed: I have personally reviewed following labs and imaging studies  CBC:  Recent Labs Lab 07/23/17 0510 07/24/17 0531 07/25/17 0238  07/26/17 0344 07/28/17 0256  WBC 11.8* 9.8 8.8 8.0 10.0  HGB 9.3* 8.5* 7.8*  7.8* 7.8*  HCT 28.5* 26.1* 23.8* 23.6* 23.1*  MCV 84.3 83.7 83.8 84.6 83.4  PLT 292 298 281 252 249   Basic Metabolic Panel:  Recent Labs Lab 07/22/17 1721  07/23/17 1637 07/24/17 0531 07/25/17 0238 07/26/17 0344 07/28/17 0256  NA 139  < > 135 137 135 136 139  K 3.7  < > 4.0 4.1 4.8 4.9 4.4  CL 106  < > 108 107 105 107 107  CO2 22  < > 15* 19* 19* 20* 23  GLUCOSE 51*  < > 167* 83 361* 365* 139*  BUN 95*  < > 90* 83* 84* 90* 83*  CREATININE 3.18*  < > 3.71* 3.41* 3.54* 3.41* 3.18*  CALCIUM 7.7*  < > 7.6* 7.8* 7.3* 7.6* 8.1*  MG 1.9  --   --   --   --   --   --   PHOS 6.5*  --   --   --   --   --   --   < > = values in this interval not displayed. Cardiac Enzymes:  Recent Labs Lab 07/22/17 1721  TROPONINI <0.03   HbA1C: No results for input(s): HGBA1C in the last 72 hours. CBG:  Recent Labs Lab 07/27/17 0800 07/27/17 1136 07/27/17 1719 07/27/17 2024 07/28/17 0805  GLUCAP 378* 239* 285* 232* 259*    Recent Results (from the past 240 hour(s))  MRSA PCR Screening     Status: None   Collection Time: 07/22/17  2:44 AM  Result Value Ref Range Status   MRSA by PCR NEGATIVE NEGATIVE Final    Comment:        The GeneXpert MRSA Assay (FDA approved for NASAL specimens only), is one component of a comprehensive MRSA colonization surveillance program. It is not intended to diagnose MRSA infection nor to guide or monitor treatment for MRSA infections.   C difficile quick scan w PCR reflex     Status: None   Collection Time: 07/22/17  9:59 PM  Result Value Ref Range Status   C Diff antigen NEGATIVE NEGATIVE Final   C Diff toxin NEGATIVE NEGATIVE Final   C Diff interpretation No C. difficile detected.  Final         Radiology Studies: No results found.      Scheduled Meds: . amLODipine  10 mg Oral Daily  . atorvastatin  20 mg Oral q1800  . carvedilol  6.25 mg Oral BID WC    . escitalopram  20 mg Oral BH-q7a  . famotidine  20 mg Oral Daily  . feeding supplement (PRO-STAT SUGAR FREE 64)  30 mL Oral TID  . Gerhardt's butt cream   Topical TID  . heparin  5,000 Units Subcutaneous Q8H  . insulin aspart  0-5 Units Subcutaneous TID WC  . insulin aspart  3 Units Subcutaneous Q breakfast  . insulin glargine  3 Units Subcutaneous Daily  . iron polysaccharides  150 mg Oral Daily  . levothyroxine  175 mcg Oral QAC breakfast  . saccharomyces boulardii  250 mg Oral BID  . sodium bicarbonate  1,300 mg Oral BID   Continuous Infusions:   LOS: 6 days     Eames Dibiasio, MD, FACP, FHM. [facility] Pager [phone]  If 7PM-7AM, please contact night-coverage [email or web address] Password [facility] 07/28/2017, 11:43 AM

## 2017-07-28 NOTE — Progress Notes (Signed)
PT Cancellation Note  Patient Details Name: Cassandra [name] MRN: [id number] DOB: 11/23/1946   Cancelled Treatment:    ReasoMerril Abben Eval/Treat Not Completed: Patient declined, no reason specified.  I've been sick all morning.Marland Kitchen.Marland Kitchen.Later... 07/28/2017  Temple BingKen Mattias Walmsley, PT [phone]  (pager)  Eliseo GumKenneth V Lashai Grosch 07/28/2017, 10:37 AM

## 2017-07-28 NOTE — Consult Note (Signed)
Subjective:   HPI  The patient is a 71 year old female with a history of diabetes mellitus who has had recurring episodes of DKA. There is also a history of stage IV chronic kidney disease, nephrotic syndrome, hypertension, hyperlipidemia, and chronic anemia. She is had multiple recent hospitalizations. In early July of this year she was having problems with diarrhea and found to have a positive C. difficile toxin. She was treated for that. After that treatment in the hospital she was sent to an SNF. She is now back in the hospital and we are asked to see her in regards to diarrhea. She states she is having 2 loose stools a day. Denies abdominal pain. She does not see blood in the stool. She has been started on a probiotic Florastor. Repeat C. difficile this admission negative. She tells me that she has [place] in the [place] in [place]. We have no records of this.     Past Medical History:  Diagnosis Date  . Allergic rhinitis   . Anemia   . Anxiety   . Arthritis    "mostly feet, hands" (10/17/2016)  . Benign hypertension with CKD (chronic kidney disease) stage III   . Benign paroxysmal positional vertigo   . Bipolar disorder (HCC)   . Broken finger   . Broken shoulder   . Broken toes   . Cervicalgia   . CHF (congestive heart failure) (HCC) 07/2016  . CKD (chronic kidney disease) stage 3, GFR 30-59 ml/min   . COPD (chronic obstructive pulmonary disease) (HCC)   . Depression   . Elevated liver enzymes Hep B/C neg 2014  . Fall at nursing home 10/15/2016  . GERD (gastroesophageal reflux disease)   . Glaucoma   . High cholesterol   . History of alcohol use   . History of blood transfusion 09/2016   "blood got really really low"  . Hypertension   . Hypothyroidism   . Interstitial cystitis    bladder stretched every 9 months  . Migraines    "pretty much qd" (10/17/2016)  . Pneumonia 12/2016  . Psoriasis   . Stroke (HCC) 05/16/2016   Left occipital and thalamic, right  hippocampal  . Thyroid disease   . Tobacco use   . 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 Vitamin B12 deficiency    Past Surgical History:  Procedure Laterality Date  . ANTERIOR CERVICAL DECOMP/DISCECTOMY FUSION  ~ 2009  . APPENDECTOMY    . BACK SURGERY    . BLADDER SUSPENSION    . CATARACT EXTRACTION W/ INTRAOCULAR LENS  IMPLANT, BILATERAL Bilateral   . DILATION AND CURETTAGE OF UTERUS    . HERNIA REPAIR    . LAPAROSCOPIC CHOLECYSTECTOMY    . TONSILLECTOMY    . TUBAL LIGATION    . VAGINAL HYSTERECTOMY     with oophorectomy   Social History   Social History  . Marital status: Married    Spouse name: N/A  . Number of children: N/A  . Years of education: N/A   Occupational History  . Not on file.   Social History Main Topics  . Smoking status: Former Smoker    Packs/day: 0.25    Years: 10.00    Types: Cigarettes    Quit date: 12/1923  . Smokeless tobacco: Never Used     Comment: QUIT SMOKING 11/2016  . Alcohol use No     Comment: Hasn't had any alcohol "for 2 yrs" (10/17/2016)  . Drug use: No  .  Sexual activity: Yes   Other Topics Concern  . Not on file   Social History Narrative   Patient has been married for 13 years and lives with spouse, has 2 children, 3 grandchildren, and 1 great grandchild. She does not use walker/cane, does not clean house, does not shop, does not do yard work, and does not drive. She is retired. Previous occupation was Furniture conservator/restorer. Completed [employer].         Epwoth Sleepiness Scale Score:  7      --I have high blood pressure   --I have had insomnia   --I seem to be losing my sex drive   --I feel stressed and lack motivation   --I wake up to urinate frequently at night   --I wake up with a dry mouth or sore throat   --I feel excessively sleepy and tired throughout the day   --I have Diabetes   --I have been told that I snore   --I have problems with memory/concentration   --I frequently awake  with headaches       family history includes Alcohol abuse in her mother; Arthritis in her mother; Asthma in her mother; COPD in her mother; Cancer in her mother and paternal grandmother; Diabetes in her father, paternal grandfather, and paternal grandmother; Heart attack in her father; Heart disease in her father and paternal grandmother; Hypertension in her father and mother; Lung disease in her mother; Migraines in her mother; Stroke in her mother and paternal grandmother.  Current Facility-Administered Medications:  .  acetaminophen (TYLENOL) tablet 650 mg, 650 mg, Oral, Q6H PRN, Hongalgi, Anand D, MD .  amLODipine (NORVASC) tablet 10 mg, 10 mg, Oral, Daily, Hongalgi, Anand D, MD, 10 mg at 07/28/17 9147 .  atorvastatin (LIPITOR) tablet 20 mg, 20 mg, Oral, q1800, Ozella Rocks, MD, 20 mg at 07/27/17 1747 .  buPROPion [facility] SR) 12 hr tablet 100 mg, 100 mg, Oral, Daily, Jonnalagadda, Janardhana, MD .  carvedilol (COREG) tablet 6.25 mg, 6.25 mg, Oral, BID WC, Hongalgi, Anand D, MD, 6.25 mg at 07/28/17 0839 .  escitalopram (LEXAPRO) tablet 20 mg, 20 mg, Oral, BH-q7a, Ozella Rocks, MD, 20 mg at 07/28/17 0630 .  famotidine (PEPCID) tablet 20 mg, 20 mg, Oral, Daily, Hongalgi, Anand D, MD, 20 mg at 07/28/17 [phone] .  feeding supplement (PRO-STAT SUGAR FREE 64) liquid 30 mL, 30 mL, Oral, TID, Ozella Rocks, MD, 30 mL at 07/28/17 0840 .  Gerhardt's butt cream, , Topical, TID, Hongalgi, Anand D, MD .  heparin injection 5,000 Units, 5,000 Units, Subcutaneous, Q8H, Ozella Rocks, MD, 5,000 Units at 07/28/17 1309 .  hydrALAZINE (APRESOLINE) injection 5-10 mg, 5-10 mg, Intravenous, Q4H PRN, Ozella Rocks, MD, 5 mg at 07/28/17 6213 .  insulin aspart (novoLOG) injection 0-5 Units, 0-5 Units, Subcutaneous, TID WC, Hongalgi, Maximino Greenland, MD, 3 Units at 07/28/17 [phone] .  insulin aspart (novoLOG) injection 3 Units, 3 Units, Subcutaneous, Q breakfast, Hongalgi, Maximino Greenland, MD, 3 Units at 07/28/17 0840 .   insulin glargine (LANTUS) injection 3 Units, 3 Units, Subcutaneous, Daily, Elease Etienne, MD, 3 Units at 07/28/17 [phone] .  iron polysaccharides (NIFEREX) capsule 150 mg, 150 mg, Oral, Daily, Ozella Rocks, MD, 150 mg at 07/28/17 0839 .  levothyroxine (SYNTHROID, LEVOTHROID) tablet 175 mcg, 175 mcg, Oral, QAC breakfast, Ozella Rocks, MD, 175 mcg at 07/28/17 [phone] .  loperamide (IMODIUM) capsule 2 mg, 2 mg, Oral, BID PRN, Hongalgi, Maximino Greenland,  MD, 2 mg at 07/27/17 2312 .  metoCLOPramide (REGLAN) tablet 5 mg, 5 mg, Oral, TID PRN, Ozella Rocks, MD, 5 mg at 07/28/17 0858 .  saccharomyces boulardii (FLORASTOR) capsule 250 mg, 250 mg, Oral, BID, Hongalgi, Anand D, MD, 250 mg at 07/28/17 1610 .  sodium bicarbonate tablet 650 mg, 650 mg, Oral, BID, Hongalgi, Maximino Greenland, MD Allergies  Allergen Reactions  . Alprazolam Other (See Comments)    Family preference, for patient to not take med  . Percocet [Oxycodone-Acetaminophen] Other (See Comments)    Family preference, for patient to not take med  . Codeine Diarrhea and Nausea And Vomiting  . Doxycycline Diarrhea and Nausea And Vomiting  . Hydrocodone Nausea And Vomiting  . Omnicef [Cefdinir] Nausea Only and Other (See Comments)    Constipation, tolerated Zosyn  . Tramadol Other (See Comments)    Pt hallucinates when taking medication. Family request not to give medication to pt   . Augmentin [Amoxicillin-Pot Clavulanate] Hives and Rash    Has patient had a PCN reaction causing immediate rash, facial/tongue/throat swelling, SOB or lightheadedness with hypotension: Yes Has patient had a PCN reaction causing severe rash involving mucus membranes or skin necrosis: Yes Did a PCN reaction that required hospitalization No Did PCN reaction occurring within the last 10 years: Yes If all of the above answers are "NO", then may proceed with Cephalosporin use.  Pt states she has taken penicillin since, and was ok with it...  . Ciprofloxacin Hives     Tolerated LVQ in 12/2016     Objective:     BP (!) 132/56 (BP Location: Left Arm)   Pulse 72   Temp 98.6 F (37 C) (Oral)   Resp 16   Ht 5\' 2"  (1.575 m)   Wt 48.9 kg (107 lb 12.8 oz)   SpO2 100%   BMI 19.72 kg/m   No distress  Sitting in chair eating  Heart regular rhythm  Lungs clear  Abdomen soft and nontender  Laboratory No components found for: D1    Assessment:     Loose stools      Plan:     Her description to me is that she's having a couple of loose stools a day. In light of her C. difficile being negative, and her just being started on a probiotic, and using Imodium, I would see how she does clinically.

## 2017-07-28 NOTE — Care Management Important Message (Signed)
Important Message  Patient Details  Name: Cassandra [name] MRN: [id number] Date of Birth: 11-24-1946   Medicare Important Message Given:  Yes    [name], [name] 07/28/2017, 11:29 AM

## 2017-07-29 LAB — GLUCOSE, CAPILLARY
GLUCOSE-CAPILLARY: 462 mg/dL — AB (ref 65–99)
GLUCOSE-CAPILLARY: 377 mg/dL — AB (ref 65–99)
GLUCOSE-CAPILLARY: 247 mg/dL — AB (ref 65–99)
Glucose-Capillary: 162 mg/dL — ABNORMAL HIGH (ref 65–99)

## 2017-07-29 MED ORDER — INSULIN ASPART 100 UNIT/ML ~~LOC~~ SOLN
3.0000 [IU] | Freq: Two times a day (BID) | SUBCUTANEOUS | Status: DC
  Administered 2017-07-29 – 2017-07-30 (×3): 3 [IU] via SUBCUTANEOUS

## 2017-07-29 NOTE — Progress Notes (Signed)
PT Cancellation Note  Patient Details Name: Cassandra [name] MRN: [id number] DOB: 07/25/1946   Cancelled Treatment:    Reason Eval/Treat Not Completed: Patient declined, no reason specified.  " I don't care what you do, I'm not getting up." 07/29/2017  Vincent BingKen Razan Siler, PT [phone]  (pager)   Cassandra [name] 07/29/2017, 10:26 AM

## 2017-07-29 NOTE — Progress Notes (Signed)
PROGRESS NOTE   Cassandra [name]  UEA:[id number]    DOB: 04-08-1946    DOA: 07/22/2017  PCP: Henrine Screws, MD   I have briefly reviewed patients previous medical records in [facility].  Brief Narrative:  71 year old female with PMH of DM 2/IDDM, recurrent episodes of DKA, brittle DM, stage IV chronic kidney disease, nephrotic syndrome, HTN, HLD, chronic anemia, C. difficile colitis, multiple recent hospitalizations (5 in the last 6 months), most recently 7/17-7/21 when she presented with syncope within 12 hours after discharge from SNF and found to be hypoglycemic with blood sugars in the 30s, sent to ED from SNF on 07/22/17 due to CBGs in the 200s over the last couple of weeks which increased to 500's on day of admission, did not respond adequately to insulin's and fluids, abdominal discomfort, nausea and ongoing diarrhea, noted to be in DKA and admitted to stepdown on insulin drip protocol, became hypoglycemic same night, insulin drip discontinued. Nephrology consulted 8/8 to assist with CKD. Adjusting insulins due to very brittle DM.Ongoing issues with hyperglycemia and hypoglycemia.Palliative care consulted for goals of care and requested psychiatry input regarding capacity to make medical decisions and depression evaluation. Adjusting insulins today. Due to tenous issues with blood sugar control, continue monitoring in the hospital for additional day and if remains stable, DC home 8/15 with home hospice.   Assessment & Plan:   Principal Problem:   Depression Active Problems:   Acute encephalopathy   Essential hypertension   HLD (hyperlipidemia)   Acute on chronic kidney failure (HCC)   Abdominal pain   DKA (diabetic ketoacidoses) (HCC)   Chronic diastolic CHF (congestive heart failure) (HCC)   Palliative care by specialist   1. DKA in type II DM, not at goal/recurrent DKA/brittle DM: Presented with glucose 519, bicarbonate 12, anion gap 23. Placed initially on insulin drip per  protocol. Became hypoglycemic on the same night of admission and insulin drip was discontinued without starting Lantus. Blood glucose this morning 433. Anion gap has closed: 13. Bicarbonate 16, likely related to chronic kidney disease. Unable to give significant IV fluids due to chronic kidney disease. Resumed home Lantus 7 units daily, NovoLog sensitive SSI with bedtime scale and added NovoLog mealtime 2 units. Reported issues with poor compliance with insulin's at home. A1c: 9.6. Had been having ongoing issues with hyperglycemia in the mornings (400s) and hypoglycemia in the evenings (40s). Reduced Lantus to 3 units daily, changed NovoLog SSI to very sensitive customized scale, discontinued bedtime sliding scale. Again had evening hypoglycemia on 8/11. Patient had no hypoglycemia on NovoLog 3 units daily at breakfast. Noted evening hyperglycemia. Discussed with diabetes coordinator and now changed to NovoLog 3 units at breakfast and lunch. Monitor overnight. Glycemic control will not be perfect. However if no hypoglycemia, likely could DC home on current regimen with close outpatient follow-up. 2. Acute on Stage IV chronic kidney disease/nephrotic syndrome: Secondary to poorly controlled DM and HTN. Baseline creatinine probably in the 2.7-2.9 range. Creatinine at recent discharge on 7/21:3.2. Presented with creatinine of 4.11. Acute kidney injury likely related to dehydration from poor oral intake, GI losses and hyperglycemia. Brief and gentle IV fluids. Nephrology consultation and follow-up appreciated >creatinine has improved and stabilized but CKD progressive over past year and she will eventually reach ESRD but not candidate for RRT for multiple reasons indicated in their note and at that time by palliative approach will be most appropriate. Nephrology signed off 8/9. Creatinine improved to 3.18 and periodically follow up as  outpatient. 3. Non-anion gap metabolic acidosis: Likely related to chronic kidney  disease. Nephrology consulted and started sodium bicarbonate 1300 MG twice a day. Nephrology recommends continuing sodium bicarbonate. Bicarbonate has normalized. Reduced sodium bicarbonate tablets.  4. Acute toxic metabolic encephalopathy: Likely secondary to multiple acute metabolic abnormalities. No clear infectious etiology. Abdominal and chest x-ray without acute findings. Resolved.  5. Abdominal pain/diarrhea: Recent C. difficile colitis. Repeat C. difficile testing negative. KUB negative for acute findings.? Added probiotics. Has had couple of CTs earlier this year which have shown stercoral proctitis. No acute findings on clinical exam. Due to ongoing intermittent abdominal pain/cramping and loose stools, [facility] was consulted. However since then, patient has improved including resolution of abdominal pain and diarrhea. GI signed off. May consider outpatient screening colonoscopy as deemed necessary. 6. Essential hypertension: Mildly uncontrolled. Resumed carvedilol. Hold Lasix, HCTZ and Cozaar. Monitor. Mildly uncontrolled. Increased amlodipine to 10 MG daily. Continue current regimen without change. 7. Hyperlipidemia: Continue statins. 8. Hypothyroid: Continue levothyroxine. 9. Depression: No SI/HI. Continue Lexapro. Psychiatry was consulted on 8/13 to determine capacity and depression management. I discussed with the psychiatrist on 8/13. They have added Wellbutrin SR 100 MG daily, continue prior Lexapro. They also indicated that patient does not have capacity to make medical decisions or living arrangements. 10. Chronic diastolic CHF: Euvolemic clinically. Holding diuretics.Marland Kitchen. 11. GERD: Holding PPI. Started Pepcid. 12. Normocytic anemia: Likely related to chronic kidney disease. Hemoglobin fluctuating/7.8 today. No overt bleeding reported. Anemia panel 06/2017: Iron:50, B12: 343, folate 12.1, ferritin 133.? Element of iron deficiency. Continue iron supplements. Follow CBCs closely and consider  transfusion if hemoglobin is 7 or less. Hemoglobin stable in the 7.8 range over the last 4 days.  13. Adult failure to thrive: Likely multifactorial. Social work input appreciated. Difficult social situation. As per daughter's report, spouse not being supportive, has left SNF in the past prior to time, owes money to SNF and spouse currently not reachable, daughter unable to take care of patient. Discussed with palliative care on 8/12 and their input appreciated. They recommended getting psychiatric consultation for capacity evaluation and addressing depression. As per case management's discussion with spouse, spouse would like to take her home with home hospice.   D.VT prophylaxis: Subcutaneous heparin Code Status: Full Family Communication: None at bedside today.  Disposition: Admitted to stepdown unit. Transferred to medical bed 8/12. Likely DC home with home hospice on 8/15.   Consultants:  Nephrology -signed off. Palliative care team Psychiatry [facility]  Procedures:  None  Antimicrobials:  None    Subjective: Denies any further abdominal pain. Reports the diarrhea has resolved. Eating some. No other complaints reported.  ROS: Denies chest pain, dyspnea, dizziness or lightheadedness. No bleeding reported.  Objective:  Vitals:   07/28/17 1255 07/28/17 2138 07/29/17 0537 07/29/17 0538  BP: (!) 132/56 (!) 147/59 (!) 166/69 (!) 166/66  Pulse: 72 70 76 72  Resp: 16 16 17    Temp: 98.6 F (37 C) 98.5 F (36.9 C) 98.4 F (36.9 C)   TempSrc: Oral Oral Oral   SpO2: 100% 97% 97%   Weight:   48.4 kg (106 lb 11.2 oz)   Height:        Examination:  General exam: Pleasant middle-aged female, small built, frail, chronically ill looking, lying comfortably supine in bed. Oral mucosa moist. Stable Respiratory system: Clear to auscultation. Respiratory effort normal. Stable Cardiovascular system: S1 and S2 heard, RRR. No JVD, murmurs or pedal edema. Stable Gastrointestinal system:  Abdomen is nondistended,  soft and nontender. No organomegaly or masses felt. Normal bowel sounds heard. No acute findings. Stable. Central nervous system: Alert and oriented 2. No focal neurological deficits. No change/stable Extremities: Symmetric 5 x 5 power. Skin: As examined with a female RN chaperone at bedside on 8/9, perianal maceration related to stooling. Topical barrier cream. Psychiatry: Judgement and insight impaired. Mood & affect flat    Data Reviewed: I have personally reviewed following labs and imaging studies  CBC:  Recent Labs Lab 07/23/17 0510 07/24/17 0531 07/25/17 0238 07/26/17 0344 07/28/17 0256  WBC 11.8* 9.8 8.8 8.0 10.0  HGB 9.3* 8.5* 7.8* 7.8* 7.8*  HCT 28.5* 26.1* 23.8* 23.6* 23.1*  MCV 84.3 83.7 83.8 84.6 83.4  PLT 292 298 281 252 249   Basic Metabolic Panel:  Recent Labs Lab 07/22/17 1721  07/23/17 1637 07/24/17 0531 07/25/17 0238 07/26/17 0344 07/28/17 0256  NA 139  < > 135 137 135 136 139  K 3.7  < > 4.0 4.1 4.8 4.9 4.4  CL 106  < > 108 107 105 107 107  CO2 22  < > 15* 19* 19* 20* 23  GLUCOSE 51*  < > 167* 83 361* 365* 139*  BUN 95*  < > 90* 83* 84* 90* 83*  CREATININE 3.18*  < > 3.71* 3.41* 3.54* 3.41* 3.18*  CALCIUM 7.7*  < > 7.6* 7.8* 7.3* 7.6* 8.1*  MG 1.9  --   --   --   --   --   --   PHOS 6.5*  --   --   --   --   --   --   < > = values in this interval not displayed. Cardiac Enzymes:  Recent Labs Lab 07/22/17 1721  TROPONINI <0.03   HbA1C: No results for input(s): HGBA1C in the last 72 hours. CBG:  Recent Labs Lab 07/28/17 1204 07/28/17 1712 07/28/17 2139 07/29/17 0755 07/29/17 1206  GLUCAP 146* 336* 282* 462* 377*    Recent Results (from the past 240 hour(s))  MRSA PCR Screening     Status: None   Collection Time: 07/22/17  2:44 AM  Result Value Ref Range Status   MRSA by PCR NEGATIVE NEGATIVE Final    Comment:        The GeneXpert MRSA Assay (FDA approved for NASAL specimens only), is one component  of a comprehensive MRSA colonization surveillance program. It is not intended to diagnose MRSA infection nor to guide or monitor treatment for MRSA infections.   C difficile quick scan w PCR reflex     Status: None   Collection Time: 07/22/17  9:59 PM  Result Value Ref Range Status   C Diff antigen NEGATIVE NEGATIVE Final   C Diff toxin NEGATIVE NEGATIVE Final   C Diff interpretation No C. difficile detected.  Final         Radiology Studies: No results found.      Scheduled Meds: . amLODipine  10 mg Oral Daily  . atorvastatin  20 mg Oral q1800  . buPROPion  100 mg Oral Daily  . carvedilol  6.25 mg Oral BID WC  . escitalopram  20 mg Oral BH-q7a  . famotidine  20 mg Oral Daily  . feeding supplement (PRO-STAT SUGAR FREE 64)  30 mL Oral TID  . Gerhardt's butt cream   Topical TID  . heparin  5,000 Units Subcutaneous Q8H  . insulin aspart  0-5 Units Subcutaneous TID WC  . insulin aspart  3 Units Subcutaneous  BID WC  . insulin glargine  3 Units Subcutaneous Daily  . iron polysaccharides  150 mg Oral Daily  . levothyroxine  175 mcg Oral QAC breakfast  . saccharomyces boulardii  250 mg Oral BID  . sodium bicarbonate  650 mg Oral BID   Continuous Infusions:   LOS: 7 days     HONGALGI,ANAND, MD, FACP, FHM. [facility] Pager [phone]  If 7PM-7AM, please contact night-coverage [email or web address] Password [facility] 07/29/2017, 12:45 PM

## 2017-07-29 NOTE — Progress Notes (Addendum)
CM received consult for home hospice. CM spoke with husband , Onalee HuaDavid @ [phone] and confirmed @ d/c pt is to d/c to home with hospice care. Choice provided by NCM to husband and [facility] provide [facility] hosp[facility] care @ d/c. [facility] @ [phone]. Gae GallopAngela Tara Wich RN,BSN,CM

## 2017-07-29 NOTE — Progress Notes (Addendum)
11am: CSW confirmed with patient's daughter, Cassandra [name], that patient would be returning home with spouse and hospice. She requested a hospital bed be delivered before patient's arrival. She confirmed that she used to have POA over patient, but that she had revoked it the last time patient became angry at her and that legally patient's spouse is POA.   9:30am:CSW spoke with patient's spouse, Cassandra HuaDavid. He reports that he would like to bring the patient home with Hospice following ([facility]) because they patient is out of SNF days and he does not want to pay privately. He reports that patient has a walker, wheelchair, and toilet lift. Patient had [facility] coming to the house. CSW alerted spouse that [facility] and confirmed [facility] address. Will alert RNCM.  Osborne Cascoadia Malina Geers LCSWA [phone]

## 2017-07-29 NOTE — Progress Notes (Signed)
Feels fine. No complaints now of diarrhea. Nothing further to add. Will sign off.

## 2017-07-29 NOTE — Progress Notes (Signed)
Inpatient Diabetes Program Recommendations  AACE/ADA: New Consensus Statement on Inpatient Glycemic Control (2015)  Target Ranges:  Prepandial:   less than 140 mg/dL      Peak postprandial:   less than 180 mg/dL (1-2 hours)      Critically ill patients:  140 - 180 mg/dL   Lab Results  Component Value Date   GLUCAP 462 (H) 07/29/2017   HGBA1C 9.6 (H) 07/22/2017    Review of Glycemic Control Results for Cassandra [name], [name] (MRN [id number]) as of 07/29/2017 10:31  Ref. Range 07/28/2017 08:05 07/28/2017 12:04 07/28/2017 17:12 07/28/2017 21:39 07/29/2017 07:55  Glucose-Capillary Latest Ref Range: 65 - 99 mg/dL 829259 (H) 562146 (H) 130336 (H) 282 (H) 462 (H)   Diabetes history: DM1 Outpatient Diabetes medications: Lantus 7 QD + Humalog 2 units TID + Humalog SSI Current orders for Inpatient glycemic control: Lantus 3 Units daily, Novolog correction 0-5 Units TID  Noted patient had hypoglycemia on 07/26/17 post correction given. Patient is type 1 so needs meal coverage tid with meals if eats 50% Inpatient Diabetes Program Recommendations: -Increase Lantus to 5 units daily -Increase Novolog 3 units meal coverage to tid if eats 50% -Add Novolog hs coverage 0-5 units  Thank you, Darel HongJudy E. Kazandra Forstrom, RN, MSN, CDE  Diabetes Coordinator Inpatient Glycemic Control Team Team Pager [phone] (8am-5pm) 07/29/2017 10:34 AM

## 2017-07-30 ENCOUNTER — Inpatient Hospital Stay (HOSPITAL_COMMUNITY): Payer: Medicare Other

## 2017-07-30 DIAGNOSIS — R112 Nausea with vomiting, unspecified: Secondary | ICD-10-CM

## 2017-07-30 LAB — GLUCOSE, CAPILLARY
GLUCOSE-CAPILLARY: 273 mg/dL — AB (ref 65–99)
GLUCOSE-CAPILLARY: 125 mg/dL — AB (ref 65–99)
Glucose-Capillary: 432 mg/dL — ABNORMAL HIGH (ref 65–99)

## 2017-07-30 MED ORDER — ONDANSETRON HCL 4 MG/2ML IJ SOLN
4.0000 mg | Freq: Four times a day (QID) | INTRAMUSCULAR | Status: DC | PRN
  Administered 2017-07-30: 4 mg via INTRAVENOUS
  Filled 2017-07-30: qty 2

## 2017-07-30 MED ORDER — INSULIN GLARGINE 100 UNIT/ML SOLOSTAR PEN: 3.0000 [IU] | PEN_INJECTOR | Freq: Every day | SUBCUTANEOUS | 0 refills | Status: AC

## 2017-07-30 MED ORDER — ONDANSETRON HCL 4 MG PO TABS: 4.0000 mg | ORAL_TABLET | Freq: Three times a day (TID) | ORAL | 0 refills | Status: AC | PRN

## 2017-07-30 MED ORDER — AMLODIPINE BESYLATE 10 MG PO TABS: 10.0000 mg | ORAL_TABLET | Freq: Every day | ORAL | 0 refills | Status: AC

## 2017-07-30 MED ORDER — INSULIN ASPART 100 UNIT/ML ~~LOC~~ SOLN: 3.0000 [IU] | Freq: Two times a day (BID) | SUBCUTANEOUS | 0 refills | Status: AC

## 2017-07-30 MED ORDER — SACCHAROMYCES BOULARDII 250 MG PO CAPS: 250.0000 mg | ORAL_CAPSULE | Freq: Two times a day (BID) | ORAL | 0 refills | Status: AC

## 2017-07-30 MED ORDER — BUPROPION HCL ER (SR) 100 MG PO TB12: 100.0000 mg | ORAL_TABLET | Freq: Every day | ORAL | 0 refills | Status: AC

## 2017-07-30 MED ORDER — SODIUM BICARBONATE 650 MG PO TABS: 650.0000 mg | ORAL_TABLET | Freq: Two times a day (BID) | ORAL | 0 refills | Status: AC

## 2017-07-30 NOTE — Discharge Summary (Signed)
Physician Discharge Summary  Cassandra [name]:[id number] DOB: [birth year] DOA: 07/22/2017  PCP: Aura Dials, MD  Admit date: 07/22/2017 Discharge date: 07/30/2017  Admitted From:home Disposition:home with [facility]   Recommendations for Outpatient Follow-up:  1. Follow up with PCP in 1-2 weeks 2. Please obtain BMP/CBC in one week   Home Health:HOSPICE Equipment/Devices:NO Discharge [place] CODE  Diet recommendation:REGULAR  Brief/Interim Summary: 71 year old female with PMH of DM 2/IDDM, recurrent episodes of DKA, brittle DM, stage IV chronic kidney disease, nephrotic syndrome, HTN, HLD, chronic anemia, C. difficile colitis, multiple recent hospitalizations (5 in the last 6 months), most recently 7/17-7/21 when she presented with syncope within 12 hours after discharge from SNF and found to be hypoglycemic with blood sugars in the 30s, sent to ED from SNF on 07/22/17 due to CBGs in the 200s over the last couple of weeks which increased to 500's on day of admission, did not respond adequately to insulin's and fluids, abdominal discomfort, nausea and ongoing diarrhea, noted to be in DKA and admitted to stepdown on insulin drip protocol, became hypoglycemic same night, insulin drip discontinued. Nephrology consulted 8/8 to assist with CKD. Adjusting insulins due to very brittle DM.Ongoing issues with hyperglycemia and hypoglycemia.Palliative care consulted for goals of care and requested psychiatry input regarding capacity to make medical decisions and depression evaluation.  # Type 2 diabetes with DKA without coma:Patient with brittle diabetes mellitus with episodes of hyper and hypoglycemia. Continue current insulin regimen. Monitor blood sugar level.it is partly because of reduced GFR. -blood sugar level better controlled. Recommend follow-up with PCP.  # acute kidney injury on the stage IV chronic kidney disease secondary due to poorly controlled diabetes  and hypertension. Evaluated by nephrologist. Serum creatinine level stable. On sodium bicarbonate.not a candidate for renal replacement therapy.  #acute toxic/metabolic encephalopathy: Improved.  # intractable nausea vomiting and abdominal pain today morning: Stat KUB of abdomen with no acute finding. Continue supportive care. Patient clinically improved. Tolerated diet well. No nausea vomiting.  -evaluated by GI during this admission and signed off.  #Essential hypertension:blood pressure acceptable. Continue current medication including amlodipine, Coreg  #Hyperlipidemia: Continue statin.  #Anxiety depression: Mood is stable. 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 Patient will go home with home hospice care.  Discharge Diagnoses:  Principal Problem:   Depression Active Problems:   Acute encephalopathy   Essential hypertension   HLD (hyperlipidemia)   Acute on chronic kidney failure (HCC)   Abdominal pain   DKA (diabetic ketoacidoses) (HCC)   Chronic diastolic CHF (congestive heart failure) ([place])   Palliative care by specialist    Discharge Instructions  Discharge Instructions    Diet - low sodium heart healthy    Complete by:  As directed    Increase activity slowly    Complete by:  As directed      Allergies as of 07/30/2017      Reactions   Alprazolam Other (See Comments)   Family preference, for patient to not take med   Percocet [oxycodone-acetaminophen] Other (See Comments)   Family preference, for patient to not take med   Codeine Diarrhea, Nausea And Vomiting   Doxycycline Diarrhea, Nausea And Vomiting    Hydrocodone  Nausea And Vomiting   Omnicef [cefdinir] Nausea Only, Other (See Comments)   Constipation, tolerated Zosyn   Tramadol Other (See Comments)   Pt hallucinates when taking medication. Family request not to give medication to pt    Augmentin [amoxicillin-pot Clavulanate] Hives, Rash   Has patient had a PCN reaction causing immediate rash, facial/tongue/throat swelling, SOB or lightheadedness with hypotension: Yes Has patient had a PCN reaction causing severe rash involving mucus membranes or skin necrosis: Yes Did a PCN reaction that required hospitalization No Did PCN reaction occurring within the last 10 years: Yes If all of the above answers are "NO", then may proceed with Cephalosporin use. Pt states she has taken penicillin since, and was ok with it...   Ciprofloxacin Hives   Tolerated LVQ in 12/2016      Medication List    STOP taking these medications   furosemide 20 MG tablet Commonly known as:  LASIX   losartan 50 MG tablet Commonly known as:  COZAAR   potassium chloride SA 20 MEQ tablet Commonly known as:  K-DUR,KLOR-CON     TAKE these medications   amLODipine 10 MG tablet Commonly known as:  NORVASC Take 1 tablet (10 mg total) by mouth daily. What changed:  medication strength  how much to take   atorvastatin 20 MG tablet Commonly known as:  LIPITOR Take 1 tablet (20 mg total) by mouth daily at 6 PM.   blood glucose meter kit and supplies Dispense based on patient and insurance preference. Use up to four times daily as directed. (FOR ICD-9 250.00, 250.01).   buPROPion 100 MG 12 hr tablet Commonly known as:  WELLBUTRIN SR Take 1 tablet (100 mg total) by mouth daily.   carvedilol 6.25 MG tablet Commonly known as:  COREG Take 1 tablet (6.25 mg total) by mouth 2 (two) times daily with a meal.   diphenhydrAMINE 25 MG tablet Commonly known as:  BENADRYL Take 2 tablets (50 mg total) by mouth every 6 (six) hours as needed for itching (tremors).    escitalopram 20 MG tablet Commonly known as:  LEXAPRO Take 1 tablet (20 mg total) by mouth every morning.   feeding supplement (NEPRO CARB STEADY) Liqd Take 237 mLs by mouth at bedtime.   feeding supplement (PRO-STAT SUGAR FREE 64) Liqd Take 30 mLs by mouth 3 (three) times daily.   insulin aspart 100 UNIT/ML injection Commonly known as:  novoLOG Inject 3 Units into the skin 2 (two) times daily with breakfast and lunch.   Insulin Glargine 100 UNIT/ML Solostar Pen Commonly known as:  LANTUS Inject 3 Units into the skin daily. What changed:  how much to take   insulin lispro 100 UNIT/ML injection Commonly known as:  HUMALOG 2 units PLUS sliding scale, three times daily with meals CBG 121 - 150: 1 units CBG 151 - 200: 2 units CBG 201 - 250: 3 units CBG 251 - 300: 5 units CBG 301 - 350: 7 units CBG 351 - 400: 9 units  Sliding every night (qhs)   CBG < 70: implement hypoglycemia protocol  CBG 70 - 120: 0 units  CBG 121 - 200: 0 units  CBG 201 - 250: 2 units  CBG 251 - 300: 3 units  CBG 301 - 350: 4 units  CBG 351 - 400: 5 units What changed:  how much to take  how to take this  when to take this  additional instructions   Insulin Pen Needle 31G X 5 MM Misc Use   daily with insulin, up to 4 times daily   INSULIN SYRINGE .3CC/31GX5/16" 31G X 5/16" 0.3 ML Misc Use daily with insulin   iron polysaccharides 150 MG capsule Commonly known as:  NIFEREX Take 1 capsule (150 mg total) by mouth daily.   levothyroxine 175 MCG tablet Commonly known as:  SYNTHROID, LEVOTHROID Take 1 tablet (175 mcg total) by mouth daily before breakfast.   loperamide 2 MG capsule Commonly known as:  IMODIUM Take 4 mg by mouth 2 (two) times daily as needed for diarrhea or loose stools.   metoCLOPramide 5 MG tablet Commonly known as:  REGLAN TK 1 T PO TID PRN FOR ABDOMINAL CRAMPING   ondansetron 4 MG tablet Commonly known as:  ZOFRAN Take 1 tablet (4 mg total) by mouth every 8 (eight) hours as needed  for nausea or vomiting.   pantoprazole 40 MG tablet Commonly known as:  PROTONIX Take 40 mg by mouth daily.   saccharomyces boulardii 250 MG capsule Commonly known as:  FLORASTOR Take 1 capsule (250 mg total) by mouth 2 (two) times daily.   sodium bicarbonate 650 MG tablet Take 1 tablet (650 mg total) by mouth 2 (two) times daily.   triamcinolone 0.025 % cream Commonly known as:  KENALOG Apply 1 application topically 2 (two) times daily.      Follow-up Information    Thacker, Robert, MD. Schedule an appointment as soon as possible for a visit in 1 week(s).   Specialty:  Family Medicine Contact information: [address] [place] [address] [phone]          Allergies  Allergen Reactions  . Alprazolam Other (See Comments)    Family preference, for patient to not take med  . Percocet [Oxycodone-Acetaminophen] Other (See Comments)    Family preference, for patient to not take med  . Codeine Diarrhea and Nausea And Vomiting  . Doxycycline Diarrhea and Nausea And Vomiting  . Hydrocodone Nausea And Vomiting  . Omnicef [Cefdinir] Nausea Only and Other (See Comments)    Constipation, tolerated Zosyn  . Tramadol Other (See Comments)    Pt hallucinates when taking medication. Family request not to give medication to pt   . Augmentin [Amoxicillin-Pot Clavulanate] Hives and Rash    Has patient had a PCN reaction causing immediate rash, facial/tongue/throat swelling, SOB or lightheadedness with hypotension: Yes Has patient had a PCN reaction causing severe rash involving mucus membranes or skin necrosis: Yes Did a PCN reaction that required hospitalization No Did PCN reaction occurring within the last 10 years: Yes If all of the above answers are "NO", then may proceed with Cephalosporin use.  Pt states she has taken penicillin since, and was ok with it...  . Ciprofloxacin Hives    Tolerated LVQ in 12/2016    Consultations: Nephrology -signed  off. Palliative care team Psychiatry [facility]  Procedures/Studies: none  Subjective: een and examined at bedside. Denied nausea vomiting chest pain or shortness of breath. Tolerated diet well.  Discharge Exam: Vitals:   07/30/17 0515 07/30/17 1446  BP: (!) 147/57 (!) 143/59  Pulse: 77 78  Resp: 14 16  Temp: 98.3 F (36.8 C) 98 F (36.7 C)  SpO2: 94% 96%   Vitals:   07/29/17 2109 07/30/17 0321 07/30/17 0515 07/30/17 1446  BP: 138/72  (!) 147/57 (!) 143/59  Pulse: 73  77 78  Resp: 17  14 16  Temp: 98.7 F (37.1 C)  98.3 F (36.8 C) 98 F (36.7 C)  TempSrc:   Oral  Oral Oral  SpO2: 95%  94% 96%  Weight:  49 kg (108 lb 1.6 oz)    Height:        General: Pt is alert, awake, not in acute distress Cardiovascular: RRR, S1/S2 +, no rubs, no gallops Respiratory: CTA bilaterally, no wheezing, no rhonchi Abdominal: Soft, NT, ND, bowel sounds + Extremities: no edema, no cyanosis    The results of significant diagnostics from this hospitalization (including imaging, microbiology, ancillary and laboratory) are listed below for reference.     Microbiology: Recent Results (from the past 240 hour(s))  MRSA PCR Screening     Status: None   Collection Time: 07/22/17  2:44 AM  Result Value Ref Range Status   MRSA by PCR NEGATIVE NEGATIVE Final    Comment:        The GeneXpert MRSA Assay (FDA approved for NASAL specimens only), is one component of a comprehensive MRSA colonization surveillance program. It is not intended to diagnose MRSA infection nor to guide or monitor treatment for MRSA infections.   C difficile quick scan w PCR reflex     Status: None   Collection Time: 07/22/17  9:59 PM  Result Value Ref Range Status   C Diff antigen NEGATIVE NEGATIVE Final   C Diff toxin NEGATIVE NEGATIVE Final   C Diff interpretation No C. difficile detected.  Final     Labs: BNP (last 3 results)  Recent Labs  05/09/17 1042 05/15/17 0820 06/12/17 1553  BNP 578.6* 330.7*  416.6*   Basic Metabolic Panel:  Recent Labs Lab 07/23/17 1637 07/24/17 0531 07/25/17 0238 07/26/17 0344 07/28/17 0256  NA 135 137 135 136 139  K 4.0 4.1 4.8 4.9 4.4  CL 108 107 105 107 107  CO2 15* 19* 19* 20* 23  GLUCOSE 167* 83 361* 365* 139*  BUN 90* 83* 84* 90* 83*  CREATININE 3.71* 3.41* 3.54* 3.41* 3.18*  CALCIUM 7.6* 7.8* 7.3* 7.6* 8.1*   Liver Function Tests: No results for input(s): AST, ALT, ALKPHOS, BILITOT, PROT, ALBUMIN in the last 168 hours. No results for input(s): LIPASE, AMYLASE in the last 168 hours. No results for input(s): AMMONIA in the last 168 hours. CBC:  Recent Labs Lab 07/24/17 0531 07/25/17 0238 07/26/17 0344 07/28/17 0256  WBC 9.8 8.8 8.0 10.0  HGB 8.5* 7.8* 7.8* 7.8*  HCT 26.1* 23.8* 23.6* 23.1*  MCV 83.7 83.8 84.6 83.4  PLT 298 281 252 249   Cardiac Enzymes: No results for input(s): CKTOTAL, CKMB, CKMBINDEX, TROPONINI in the last 168 hours. BNP: Invalid input(s): POCBNP CBG:  Recent Labs Lab 07/29/17 1206 07/29/17 1643 07/29/17 2107 07/30/17 0741 07/30/17 1310  GLUCAP 377* 247* 162* 432* 273*   D-Dimer No results for input(s): DDIMER in the last 72 hours. Hgb A1c No results for input(s): HGBA1C in the last 72 hours. Lipid Profile No results for input(s): CHOL, HDL, LDLCALC, TRIG, CHOLHDL, LDLDIRECT in the last 72 hours. Thyroid function studies No results for input(s): TSH, T4TOTAL, T3FREE, THYROIDAB in the last 72 hours.  Invalid input(s): FREET3 Anemia work up No results for input(s): VITAMINB12, FOLATE, FERRITIN, TIBC, IRON, RETICCTPCT in the last 72 hours. Urinalysis    Component Value Date/Time   COLORURINE YELLOW 07/02/2017 0800   APPEARANCEUR HAZY (A) 07/02/2017 0800   APPEARANCEUR Clear 06/15/2014 1508   LABSPEC 1.013 07/02/2017 0800   LABSPEC 1.010 06/15/2014 1508   PHURINE 6.0 07/02/2017 0800   GLUCOSEU >=500 (A) 07/02/2017 0800   GLUCOSEU Negative 06/15/2014 1508  HGBUR NEGATIVE 07/02/2017 0800    BILIRUBINUR NEGATIVE 07/02/2017 0800   BILIRUBINUR Negative 06/15/2014 1508   KETONESUR 20 (A) 07/02/2017 0800   PROTEINUR >=300 (A) 07/02/2017 0800   UROBILINOGEN 0.2 09/14/2015 1617   NITRITE NEGATIVE 07/02/2017 0800   LEUKOCYTESUR NEGATIVE 07/02/2017 0800   LEUKOCYTESUR Negative 06/15/2014 1508   Sepsis Labs Invalid input(s): PROCALCITONIN,  WBC,  LACTICIDVEN Microbiology Recent Results (from the past 240 hour(s))  MRSA PCR Screening     Status: None   Collection Time: 07/22/17  2:44 AM  Result Value Ref Range Status   MRSA by PCR NEGATIVE NEGATIVE Final    Comment:        The GeneXpert MRSA Assay (FDA approved for NASAL specimens only), is one component of a comprehensive MRSA colonization surveillance program. It is not intended to diagnose MRSA infection nor to guide or monitor treatment for MRSA infections.   C difficile quick scan w PCR reflex     Status: None   Collection Time: 07/22/17  9:59 PM  Result Value Ref Range Status   C Diff antigen NEGATIVE NEGATIVE Final   C Diff toxin NEGATIVE NEGATIVE Final   C Diff interpretation No C. difficile detected.  Final     Time coordinating discharge: 32 minutes  SIGNED:    Prasad , MD  [facility] 07/30/2017, 3:18 PM  If 7PM-7AM, please contact night-coverage [email or web address] Password [email or web address]  

## 2017-07-30 NOTE — Care Management Note (Signed)
Case Management Note  Patient Details  Name: Cassandra [name] MRN: [id number] Date of Birth: [birth year]  Subjective/Objective:              Pt  admitted with depression, history  of C KD, BPPV, bipolar disorder, depression/anxiety, anemia, CHF, GERD, hyperlipidemia, hypertension, interstitial cystitis, migraines, CVA, thyroid dysfunction, diabetes.   Tyrone NineNicole J King (Daughter) Raoul PitchKeeley Simmons (Daughter) Onalee HuaDavid (husband)   [phone]       PCP: Henrine Screwsobert Thacker  Action/Plan:  Plan is to d/c today with hospice to follow. CM spoke with husband Onalee HuaDavid and he will pick up pt for transportation to home @ 5:00 pm.  Expected Discharge Date:  07/30/17               Expected Discharge Plan:  Home with hospice care In-House Referral:  Clinical Social Work  Discharge planning Services  CM Consult  Post Acute Care Choice:    Choice offered to:  Spouse  DME Arranged:    DME Agency:     HH Arranged:    HH Agency:  [facility]  Status of Service:  Completed, signed off  If discussed at [employer] Length of Stay Meetings, dates discussed:    Additional Comments:  Epifanio LeschesCole, Lavell Supple Hudson, RN 07/30/2017, 3:20 PM

## 2017-07-30 NOTE — Progress Notes (Signed)
PROGRESS NOTE    Cassandra [name]  WUJ:[id number] DOB: [birth year] DOA: 07/22/2017 PCP: Henrine Screws, MD   Brief Narrative: 71 year old female with PMH of DM 2/IDDM, recurrent episodes of DKA, brittle DM, stage IV chronic kidney disease, nephrotic syndrome, HTN, HLD, chronic anemia, C. difficile colitis, multiple recent hospitalizations (5 in the last 6 months), most recently 7/17-7/21 when she presented with syncope within 12 hours after discharge from SNF and found to be hypoglycemic with blood sugars in the 30s, sent to ED from SNF on 07/22/17 due to CBGs in the 200s over the last couple of weeks which increased to 500's on day of admission, did not respond adequately to insulin's and fluids, abdominal discomfort, nausea and ongoing diarrhea, noted to be in DKA and admitted to stepdown on insulin drip protocol, became hypoglycemic same night, insulin drip discontinued. Nephrology consulted 8/8 to assist with CKD. Adjusting insulins due to very brittle DM.Ongoing issues with hyperglycemia and hypoglycemia.Palliative care consulted for goals of care and requested psychiatry input regarding capacity to make medical decisions and depression evaluation.  Assessment & Plan:  # Type 2 diabetes with DKA without coma:Patient with brittle diabetes mellitus with episodes of hyper and hypoglycemia. Continue current insulin regimen. Monitor blood sugar level.it is partly because of reduced GFR.  # acute kidney injury on the stage IV chronic kidney disease secondary due to poorly controlled diabetes and hypertension. Evaluated by nephrologist. Serum creatinine level stable. On sodium bicarbonate.  #acute toxic/metabolic encephalopathy: Improved.  # intractable nausea vomiting and abdominal pain: Stat KUB of abdomen with no acute finding. Continue supportive care.continue to monitor today.  -evaluated by GI during this admission and signed off.  #Essential hypertension:blood pressure acceptable. Continue  current medication including amlodipine, Coreg  #Hyperlipidemia: Continue statin.  #Anxiety depression: Mood is stable. 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 Patient will likely go home with home hospice care.  DVT prophylaxis:heparin sq Code Status:full code Family Communication:no family at bedside Disposition Plan:likely home with hospice in 1-2 days    Consultants:  Nephrology -signed off. Palliative care team Psychiatry [facility]  Procedures:none Antimicrobials:none  Subjective: Seen and examined at bedside. Patient reported nausea and abdominal pain. Not feeling well.no chest pain, shortness of breath, headache or dizziness.  Objective: Vitals:   07/29/17 1407 07/29/17 2109 07/30/17 0321 07/30/17 0515  BP: (!) 140/59 138/72  (!) 147/57  Pulse: 74 73  77  Resp: 20 17  14   Temp: 98.5 F (36.9 C) 98.7 F (37.1 C)  98.3 F (36.8 C)  TempSrc: Oral Oral  Oral  SpO2: 96% 95%  94%  Weight:   49 kg (108 lb 1.6 oz)   Height:        Intake/Output Summary (Last 24 hours) at 07/30/17 1442 Last data filed at 07/30/17 1218  Gross per 24 hour  Intake               25 ml  Output              302 ml  Net             -277 ml   Filed Weights   07/28/17 0628 07/29/17 0537 07/30/17 0321  Weight: 48.9  kg (107 lb 12.8 oz) 48.4 kg (106 lb 11.2 oz) 49 kg (108 lb 1.6 oz)    Examination:  General exam: Appears calm and comfortable  Respiratory system: Clear to auscultation. Respiratory effort normal. No wheezing or crackle Cardiovascular system: S1 & S2 heard, RRR.  No pedal edema. Gastrointestinal system: Abdomen is nondistended, soft  and nontender. Normal bowel sounds heard. Central nervous system: Alert awake and following commands Skin: No rashes, lesions or ulcers   Data Reviewed: I have personally reviewed following labs and imaging studies  CBC:  Recent Labs Lab 07/24/17 0531 07/25/17 0238 07/26/17 0344 07/28/17 0256  WBC 9.8 8.8 8.0 10.0  HGB 8.5* 7.8* 7.8* 7.8*  HCT 26.1* 23.8* 23.6* 23.1*  MCV 83.7 83.8 84.6 83.4  PLT 298 281 252 249   Basic Metabolic Panel:  Recent Labs Lab 07/23/17 1637 07/24/17 0531 07/25/17 0238 07/26/17 0344 07/28/17 0256  NA 135 137 135 136 139  K 4.0 4.1 4.8 4.9 4.4  CL 108 107 105 107 107  CO2 15* 19* 19* 20* 23  GLUCOSE 167* 83 361* 365* 139*  BUN 90* 83* 84* 90* 83*  CREATININE 3.71* 3.41* 3.54* 3.41* 3.18*  CALCIUM 7.6* 7.8* 7.3* 7.6* 8.1*   GFR: Estimated Creatinine Clearance: 12.9 mL/min (A) (by C-G formula based on SCr of 3.18 mg/dL (H)). Liver Function Tests: No results for input(s): AST, ALT, ALKPHOS, BILITOT, PROT, ALBUMIN in the last 168 hours. No results for input(s): LIPASE, AMYLASE in the last 168 hours. No results for input(s): AMMONIA in the last 168 hours. Coagulation Profile: No results for input(s): INR, PROTIME in the last 168 hours. Cardiac Enzymes: No results for input(s): CKTOTAL, CKMB, CKMBINDEX, TROPONINI in the last 168 hours. BNP (last 3 results) No results for input(s): PROBNP in the last 8760 hours. HbA1C: No results for input(s): HGBA1C in the last 72 hours. CBG:  Recent Labs Lab 07/29/17 1206 07/29/17 1643 07/29/17 2107 07/30/17 0741 07/30/17 1310  GLUCAP 377* 247* 162* 432* 273*   Lipid Profile: No results for input(s): CHOL, HDL, LDLCALC, TRIG, CHOLHDL, LDLDIRECT in the last 72 hours. Thyroid Function Tests: No results for input(s): TSH, T4TOTAL, FREET4, T3FREE, THYROIDAB in the last 72 hours. Anemia Panel: No results for input(s): VITAMINB12, FOLATE, FERRITIN, TIBC, IRON, RETICCTPCT in the last 72 hours. Sepsis  Labs: No results for input(s): PROCALCITON, LATICACIDVEN in the last 168 hours.  Recent Results (from the past 240 hour(s))  MRSA PCR Screening     Status: None   Collection Time: 07/22/17  2:44 AM  Result Value Ref Range Status   MRSA by PCR NEGATIVE NEGATIVE Final    Comment:        The GeneXpert MRSA Assay (FDA approved for NASAL specimens only), is one component of a comprehensive MRSA colonization surveillance program. It is not intended to diagnose MRSA infection nor to guide or monitor treatment for MRSA infections.   C difficile quick scan w PCR reflex     Status: None   Collection Time: 07/22/17  9:59 PM  Result Value Ref Range Status   C Diff antigen NEGATIVE NEGATIVE Final   C Diff toxin NEGATIVE NEGATIVE Final   C Diff interpretation No C. difficile detected.  Final         Radiology Studies: Dg Abd 2 Views  Result Date: 07/30/2017 CLINICAL DATA:  Diarrhea, abdominal pain for 3 weeks EXAM: ABDOMEN - 2 VIEW COMPARISON:  None. FINDINGS: There is no bowel dilatation to suggest obstruction. There  is no evidence of pneumoperitoneum, portal venous gas or pneumatosis. There are no pathologic calcifications along the expected course of the ureters. The osseous structures are unremarkable. IMPRESSION: Negative. Electronically Signed   By: Elige Ko   On: 07/30/2017 13:34        Scheduled Meds: . amLODipine  10 mg Oral Daily  . atorvastatin  20 mg Oral q1800  . buPROPion  100 mg Oral Daily  . carvedilol  6.25 mg Oral BID WC  . escitalopram  20 mg Oral BH-q7a  . famotidine  20 mg Oral Daily  . feeding supplement (PRO-STAT SUGAR FREE 64)  30 mL Oral TID  . Gerhardt's butt cream   Topical TID  . heparin  5,000 Units Subcutaneous Q8H  . insulin aspart  0-5 Units Subcutaneous TID WC  . insulin aspart  3 Units Subcutaneous BID WC  . insulin glargine  3 Units Subcutaneous Daily  . iron polysaccharides  150 mg Oral Daily  . levothyroxine  175 mcg Oral QAC breakfast   . saccharomyces boulardii  250 mg Oral BID  . sodium bicarbonate  650 mg Oral BID   Continuous Infusions:   LOS: 8 days    Keali Mccraw Jaynie Collins, MD [facility] Pager [phone]  If 7PM-7AM, please contact night-coverage [email or web address] Password [email or web address] 07/30/2017, 2:42 PM

## 2017-07-31 ENCOUNTER — Emergency Department (HOSPITAL_COMMUNITY)
Admission: EM | Admit: 2017-07-31 | Discharge: 2017-07-31 | Disposition: A | Discharge status: Home / Self Care | Attending: Emergency Medicine | Admitting: Emergency Medicine

## 2017-07-31 ENCOUNTER — Emergency Department (HOSPITAL_COMMUNITY)

## 2017-07-31 DIAGNOSIS — E039 Hypothyroidism, unspecified: Secondary | ICD-10-CM | POA: Insufficient documentation

## 2017-07-31 DIAGNOSIS — K6289 Other specified diseases of anus and rectum: Secondary | ICD-10-CM | POA: Diagnosis not present

## 2017-07-31 DIAGNOSIS — R739 Hyperglycemia, unspecified: Secondary | ICD-10-CM

## 2017-07-31 DIAGNOSIS — Z794 Long term (current) use of insulin: Secondary | ICD-10-CM | POA: Diagnosis not present

## 2017-07-31 DIAGNOSIS — R531 Weakness: Secondary | ICD-10-CM | POA: Insufficient documentation

## 2017-07-31 DIAGNOSIS — I5032 Chronic diastolic (congestive) heart failure: Secondary | ICD-10-CM | POA: Diagnosis not present

## 2017-07-31 DIAGNOSIS — Z79899 Other long term (current) drug therapy: Secondary | ICD-10-CM | POA: Insufficient documentation

## 2017-07-31 DIAGNOSIS — E1022 Type 1 diabetes mellitus with diabetic chronic kidney disease: Secondary | ICD-10-CM | POA: Insufficient documentation

## 2017-07-31 DIAGNOSIS — R109 Unspecified abdominal pain: Secondary | ICD-10-CM

## 2017-07-31 DIAGNOSIS — N184 Chronic kidney disease, stage 4 (severe): Secondary | ICD-10-CM | POA: Diagnosis not present

## 2017-07-31 DIAGNOSIS — R197 Diarrhea, unspecified: Secondary | ICD-10-CM

## 2017-07-31 DIAGNOSIS — I130 Hypertensive heart and chronic kidney disease with heart failure and stage 1 through stage 4 chronic kidney disease, or unspecified chronic kidney disease: Secondary | ICD-10-CM | POA: Diagnosis not present

## 2017-07-31 DIAGNOSIS — J449 Chronic obstructive pulmonary disease, unspecified: Secondary | ICD-10-CM | POA: Insufficient documentation

## 2017-07-31 DIAGNOSIS — Z87891 Personal history of nicotine dependence: Secondary | ICD-10-CM | POA: Diagnosis not present

## 2017-07-31 DIAGNOSIS — Z8673 Personal history of transient ischemic attack (TIA), and cerebral infarction without residual deficits: Secondary | ICD-10-CM | POA: Insufficient documentation

## 2017-07-31 DIAGNOSIS — R1084 Generalized abdominal pain: Secondary | ICD-10-CM | POA: Diagnosis present

## 2017-07-31 LAB — CBC
HCT: 25.5 % — ABNORMAL LOW (ref 36.0–46.0)
Hemoglobin: 8 g/dL — ABNORMAL LOW (ref 12.0–15.0)
MCH: 27.6 pg (ref 26.0–34.0)
MCHC: 31.4 g/dL (ref 30.0–36.0)
MCV: 87.9 fL (ref 78.0–100.0)
Platelets: 333 10*3/uL (ref 150–400)
RBC: 2.9 MIL/uL — ABNORMAL LOW (ref 3.87–5.11)
RDW: 14.6 % (ref 11.5–15.5)
WBC: 11.9 10*3/uL — AB (ref 4.0–10.5)

## 2017-07-31 LAB — COMPREHENSIVE METABOLIC PANEL
ALBUMIN: 2.1 g/dL — AB (ref 3.5–5.0)
ALK PHOS: 71 U/L (ref 38–126)
ALT: 20 U/L (ref 14–54)
ANION GAP: 20 — AB (ref 5–15)
AST: 14 U/L — ABNORMAL LOW (ref 15–41)
BILIRUBIN TOTAL: 1 mg/dL (ref 0.3–1.2)
BUN: 94 mg/dL — ABNORMAL HIGH (ref 6–20)
CALCIUM: 8.2 mg/dL — AB (ref 8.9–10.3)
CO2: 12 mmol/L — AB (ref 22–32)
CREATININE: 3.96 mg/dL — AB (ref 0.44–1.00)
Chloride: 105 mmol/L (ref 101–111)
GFR calc non Af Amer: 11 mL/min — ABNORMAL LOW (ref >60)
GFR, EST AFRICAN AMERICAN: 12 mL/min — AB (ref >60)
GLUCOSE: 620 mg/dL — AB (ref 65–99)
Potassium: 4.9 mmol/L (ref 3.5–5.1)
Sodium: 137 mmol/L (ref 135–145)
TOTAL PROTEIN: 5.5 g/dL — AB (ref 6.5–8.1)

## 2017-07-31 LAB — CBG MONITORING, ED: Glucose-Capillary: 561 mg/dL (ref 65–99)

## 2017-07-31 MED ORDER — ONDANSETRON 8 MG PO TBDP: 8.0000 mg | ORAL_TABLET | Freq: Three times a day (TID) | ORAL | 0 refills | Status: AC | PRN

## 2017-07-31 MED ORDER — ONDANSETRON HCL 4 MG/2ML IJ SOLN
4.0000 mg | Freq: Once | INTRAMUSCULAR | Status: AC
  Administered 2017-07-31: 4 mg via INTRAVENOUS
  Filled 2017-07-31: qty 2

## 2017-07-31 MED ORDER — IOPAMIDOL (ISOVUE-300) INJECTION 61%
INTRAVENOUS | Status: AC
  Filled 2017-07-31: qty 30

## 2017-07-31 MED ORDER — IOPAMIDOL (ISOVUE-300) INJECTION 61%
30.0000 mL | Freq: Once | INTRAVENOUS | Status: AC | PRN
  Administered 2017-07-31: 30 mL via ORAL

## 2017-07-31 NOTE — ED Notes (Signed)
Pt's POA reports they are trying to get the Pt into Hospice care.  Sts she was recently discharged w/ [facility].  Sts they had her sent in today because they feel she is impacted.  Sts "they missed it yesterday, because they didn't xray low enough.  It's next to her rectum."

## 2017-07-31 NOTE — ED Notes (Signed)
PT DOES NOT HAVE TO DRINK ORAL CONTRAST

## 2017-07-31 NOTE — ED Notes (Signed)
ED Provider at bedside. 

## 2017-07-31 NOTE — ED Triage Notes (Signed)
Patient D/C from [facility] yesterday. Per EMS, since D/C abdominal pain has become progressively worse. Experiencing diarrhea, nausea, vomiting, black and tarry stool. Refused pain meds on ambulance due to numerous medication allergies. AOx3 (which is baseline per family).

## 2017-07-31 NOTE — ED Provider Notes (Signed)
[facility] DEPT Provider Note   CSN: [id number] Arrival date & time: 07/31/17  1229     History   Chief Complaint Chief Complaint  Patient presents with  . Abdominal Pain    HPI Cassandra [name] is a 71 y.o. female.  HPI Patient has a history of end-stage renal disease and failure to thrive.  She's had progressive weakness at home and was recently discharged from the hospital.  Last night she developed severe abdominal pain and the family is concerned that she could have a fecal impaction with leaking diarrhea around this.  Patient has no complaints ofabdominal pain at this time.  She reports burning around her rectum.  No fevers or chills.  Denies shortness of breath. No reports of nausea or vomiting.  Family is concerned that the stools are somewhat darker.  Family is made a recent change in the direction of the patient's care and hospice has been involved.  They're focused on comfort measures only at this time.  They are interested in CT imaging of her abdomen however is her concern that she could have a fecal impaction which is causing significant discomfort at this time.  Patient without other complaints   Past Medical History:  Diagnosis Date  . Allergic rhinitis   . Anemia   . Anxiety   . Arthritis    "mostly feet, hands" (10/17/2016)  . Benign hypertension with CKD (chronic kidney disease) stage III   . Benign paroxysmal positional vertigo   . Bipolar disorder ([place])   . Broken finger   . Broken shoulder   . Broken toes   . Cervicalgia   . CHF (congestive heart failure) ([place]) 07/2016  . CKD (chronic kidney disease) stage 3, GFR 30-59 ml/min   . COPD (chronic obstructive pulmonary disease) ([place])   . Depression   . Elevated liver enzymes Hep B/C neg 2014  . Fall at nursing home 10/15/2016  . GERD (gastroesophageal reflux disease)   . Glaucoma   . High cholesterol   . History of alcohol use   . History of blood transfusion 09/2016   "blood got really really low"   . Hypertension   . Hypothyroidism   . Interstitial cystitis    bladder stretched every 9 months  . Migraines    "pretty much qd" (10/17/2016)  . Pneumonia 12/2016  . Psoriasis   . Stroke ([place]) 05/16/2016   Left occipital and thalamic, right hippocampal  . Thyroid disease   . Tobacco use   . 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 Vitamin B12 deficiency     Patient Active Problem List   Diagnosis Date Noted  . Palliative care by specialist   . DKA (diabetic ketoacidoses) ([place]) 07/22/2017  . Chronic diastolic CHF (congestive heart failure) ([place]) 07/22/2017  . Syncope 07/01/2017  . C. difficile colitis 07/01/2017  . Acute renal failure (ARF) ([place]) 06/14/2017  . Malnutrition of moderate degree 06/13/2017  . Acute renal failure ([place]) 06/13/2017  . CKD stage 4 due to type 2 diabetes mellitus ([place]) 06/12/2017  . Anasarca associated with disorder of kidney 06/12/2017  . Nephrotic syndrome due to type 2 diabetes mellitus ([place]) 06/12/2017  . Anasarca 05/09/2017  . Non-intractable vomiting with nausea 03/08/2017  . Constipation   . Abdominal pain   . Fecal impaction ([place]) 01/04/2017  . HAP (hospital-acquired pneumonia) 12/23/2016  . HCAP (healthcare-associated pneumonia)   . Type 2 diabetes mellitus with hypoglycemia without coma ([place])   . Hypoglycemia  12/03/2016  . Palliative care encounter   . Goals of care, counseling/discussion   . Cervical vertebral fracture ([place]) 10/17/2016  . Thoracic vertebral fracture ([place]) 10/17/2016  . Fall at nursing home 10/17/2016  . Common bile duct dilatation 10/17/2016  . Closed fracture of cervical vertebra ([place])   . Recent Gram-negative bacteremia 10/07/2016  . Hyperkalemia   . Gastroesophageal reflux disease   . DKA, type 1 ([place]) 09/06/2016  . Protein-calorie malnutrition, severe 08/28/2016  . AKI (acute kidney injury) ([place]) 07/19/2016  . Acute on chronic diastolic CHF (congestive heart failure) ([place]) 07/19/2016  .  Dehydration   . Cerebral embolism with cerebral infarction 07/13/2016  . Left ventricular diastolic dysfunction, NYHA class 1 07/11/2016  . History of CVA (cerebrovascular accident)   . Essential hypertension   . HLD (hyperlipidemia)   . Anemia of chronic disease   . Physical deconditioning   . Acute on chronic kidney failure ([place])   . Diabetic ketoacidosis with coma associated with type 1 diabetes mellitus ([place])   . Cerebral thrombosis with cerebral infarction 05/15/2016  . Generalized anxiety disorder 05/10/2016  . Diabetic ketoacidosis without coma associated with type 1 diabetes mellitus ([place]) 05/06/2016  . Acute encephalopathy 05/06/2016  . Insulin dependent type 2 diabetes mellitus, uncontrolled ([place])   . Depression   . CKD (chronic kidney disease) stage 3, GFR 30-59 ml/min   . Uncontrolled Hypothyroidism   . Vitamin B12 deficiency   . Benign paroxysmal positional vertigo   . Anxiety   . Allergic rhinitis   . Glaucoma   . Benign hypertension with CKD (chronic kidney disease) stage III   . Tobacco abuse   . Cervicalgia   . Elevated liver enzymes   . History of alcohol use     Past Surgical History:  Procedure Laterality Date  . ANTERIOR CERVICAL DECOMP/DISCECTOMY FUSION  ~ 2009  . APPENDECTOMY    . BACK SURGERY    . BLADDER SUSPENSION    . CATARACT EXTRACTION W/ INTRAOCULAR LENS  IMPLANT, BILATERAL Bilateral   . DILATION AND CURETTAGE OF UTERUS    . HERNIA REPAIR    . LAPAROSCOPIC CHOLECYSTECTOMY    . TONSILLECTOMY    . TUBAL LIGATION    . VAGINAL HYSTERECTOMY     with oophorectomy    OB History    No data available       Home Medications    Prior to Admission medications   Medication Sig Start Date End Date Taking? Authorizing Provider  Amino Acids-Protein Hydrolys (FEEDING SUPPLEMENT, PRO-STAT SUGAR FREE 64,) LIQD Take 30 mLs by mouth 3 (three) times daily. 06/20/17  Yes Johnson, Clanford L, MD  atorvastatin (LIPITOR) 20 MG tablet Take 1 tablet (20 mg  total) by mouth daily at 6 PM. 06/20/17  Yes Johnson, Clanford L, MD  blood glucose meter kit and supplies Dispense based on patient and insurance preference. Use up to four times daily as directed. (FOR ICD-9 250.00, 250.01). 07/05/17  Yes Dessa Phi Chahn-Yang, DO  carvedilol (COREG) 6.25 MG tablet Take 1 tablet (6.25 mg total) by mouth 2 (two) times daily with a meal. 06/20/17  Yes Johnson, Clanford L, MD  diphenhydrAMINE (BENADRYL) 25 MG tablet Take 2 tablets (50 mg total) by mouth every 6 (six) hours as needed for itching (tremors). 06/21/17  Yes Domenic Moras, PA-C  escitalopram (LEXAPRO) 20 MG tablet Take 1 tablet (20 mg total) by mouth every morning. 06/20/17  Yes Johnson, Clanford L, MD  insulin aspart (NOVOLOG) 100 UNIT/ML  injection Inject 3 Units into the skin 2 (two) times daily with breakfast and lunch. 07/31/17  Yes Rosita Fire, MD  Insulin Glargine (LANTUS) 100 UNIT/ML Solostar Pen Inject 3 Units into the skin daily. 07/30/17  Yes Rosita Fire, MD  insulin lispro (HUMALOG) 100 UNIT/ML injection 2 units PLUS sliding scale, three times daily with meals CBG 121 - 150: 1 units CBG 151 - 200: 2 units CBG 201 - 250: 3 units CBG 251 - 300: 5 units CBG 301 - 350: 7 units CBG 351 - 400: 9 units  Sliding every night (qhs)   CBG < 70: implement hypoglycemia protocol  CBG 70 - 120: 0 units  CBG 121 - 200: 0 units  CBG 201 - 250: 2 units  CBG 251 - 300: 3 units  CBG 301 - 350: 4 units  CBG 351 - 400: 5 units Patient taking differently: Inject 2 Units into the skin See admin instructions. 2 units with meals plus 0-69=initiate hypoglycemic protocol, 70-200=0 units, 201-250=2 units, 251-300=3 units, 301-350=4 units, 351-400=5 units, >400 notify MD 07/05/17  Yes Dessa Phi Chahn-Yang, DO  Insulin Pen Needle 31G X 5 MM MISC Use daily with insulin, up to 4 times daily 07/05/17  Yes Dessa Phi Chahn-Yang, DO  Insulin Syringe-Needle U-100 (INSULIN SYRINGE .3CC/31GX5/16") 31G X  5/16" 0.3 ML MISC Use daily with insulin 07/05/17  Yes Dessa Phi Chahn-Yang, DO  iron polysaccharides (NIFEREX) 150 MG capsule Take 1 capsule (150 mg total) by mouth daily. 06/20/17  Yes Johnson, Clanford L, MD  levothyroxine (SYNTHROID, LEVOTHROID) 175 MCG tablet Take 1 tablet (175 mcg total) by mouth daily before breakfast. 01/07/17  Yes Sudini, Alveta Heimlich, MD  loperamide (IMODIUM) 2 MG capsule Take 4 mg by mouth 2 (two) times daily as needed for diarrhea or loose stools.   Yes [provider]  metoCLOPramide (REGLAN) 5 MG tablet TK 1 T PO TID PRN FOR ABDOMINAL CRAMPING 05/14/17  Yes [provider]  Nutritional Supplements (FEEDING SUPPLEMENT, NEPRO CARB STEADY,) LIQD Take 237 mLs by mouth at bedtime. 06/20/17  Yes Johnson, Clanford L, MD  ondansetron (ZOFRAN) 4 MG tablet Take 1 tablet (4 mg total) by mouth every 8 (eight) hours as needed for nausea or vomiting. 07/30/17  Yes Rosita Fire, MD  pantoprazole (PROTONIX) 40 MG tablet Take 40 mg by mouth daily.   Yes [provider]  saccharomyces boulardii (FLORASTOR) 250 MG capsule Take 1 capsule (250 mg total) by mouth 2 (two) times daily. 07/30/17  Yes Rosita Fire, MD  sodium bicarbonate 650 MG tablet Take 1 tablet (650 mg total) by mouth 2 (two) times daily. 07/30/17  Yes Rosita Fire, MD  triamcinolone (KENALOG) 0.025 % cream Apply 1 application topically 2 (two) times daily.   Yes [provider]  amLODipine (NORVASC) 10 MG tablet Take 1 tablet (10 mg total) by mouth daily. 07/31/17   Rosita Fire, MD  buPROPion [facility] SR) 100 MG 12 hr tablet Take 1 tablet (100 mg total) by mouth daily. 07/31/17   Rosita Fire, MD    Family History Family History  Problem Relation Age of Onset  . Alcohol abuse Mother   . Arthritis Mother   . Asthma Mother   . Cancer Mother        colon cancer  . Hypertension Mother   . Migraines Mother   . Stroke Mother   . Lung disease Mother    . COPD Mother   . Diabetes  Father   . Hypertension Father   . Heart disease Father   . Heart attack Father   . Heart disease Paternal Grandmother   . Diabetes Paternal Grandmother   . Stroke Paternal Grandmother   . Cancer Paternal Grandmother   . Diabetes Paternal Grandfather     Social History Social History  Substance Use Topics  . Smoking status: Former Smoker    Packs/day: 0.25    Years: 10.00    Types: Cigarettes    Quit date: 12/1923  . Smokeless tobacco: Never Used     Comment: QUIT SMOKING 11/2016  . Alcohol use No     Comment: Hasn't had any alcohol "for 2 yrs" (10/17/2016)     Allergies   Alprazolam; Percocet [oxycodone-acetaminophen]; Codeine; Doxycycline; Hydrocodone; Omnicef [cefdinir]; Tramadol; Augmentin [amoxicillin-pot clavulanate]; and Ciprofloxacin   Review of Systems Review of Systems  All other systems reviewed and are negative.    Physical Exam Updated Vital Signs BP (!) 159/77   Pulse 83   Temp 98.3 F (36.8 C) (Oral)   Resp 18   SpO2 97%   Physical Exam  Constitutional: She is oriented to person, place, and time. She appears well-developed and well-nourished. No distress.  HENT:  Head: Normocephalic and atraumatic.  Eyes: EOM are normal.  Neck: Normal range of motion.  Cardiovascular: Normal rate, regular rhythm and normal heart sounds.   Pulmonary/Chest: Effort normal and breath sounds normal.  Abdominal: Soft. She exhibits no distension. There is no tenderness.  Genitourinary:  Genitourinary Comments: Raw irritation around her anus.  No bleeding.   Musculoskeletal: Normal range of motion.  Neurological: She is alert and oriented to person, place, and time.  Skin: Skin is warm and dry.  Psychiatric: She has a normal mood and affect. Judgment normal.  Nursing note and vitals reviewed.    ED Treatments / Results  Labs (all labs ordered are listed, but only abnormal results are displayed) Labs Reviewed  CBC - Abnormal;  Notable for the following:       Result Value   WBC 11.9 (*)    RBC 2.90 (*)    Hemoglobin 8.0 (*)    HCT 25.5 (*)    All other components within normal limits  COMPREHENSIVE METABOLIC PANEL - Abnormal; Notable for the following:    CO2 12 (*)    Glucose, Bld 620 (*)    BUN 94 (*)    Creatinine, Ser 3.96 (*)    Calcium 8.2 (*)    Total Protein 5.5 (*)    Albumin 2.1 (*)    AST 14 (*)    GFR calc non Af Amer 11 (*)    GFR calc Af Amer 12 (*)    Anion gap 20 (*)    All other components within normal limits  CBG MONITORING, ED - Abnormal; Notable for the following:    Glucose-Capillary 561 (*)    All other components within normal limits    EKG  EKG Interpretation None       Radiology Ct Abdomen Pelvis Wo Contrast  Result Date: 07/31/2017 CLINICAL DATA:  Abdominal pain with diarrhea, nausea, and vomiting. EXAM: CT ABDOMEN AND PELVIS WITHOUT CONTRAST TECHNIQUE: Multidetector CT imaging of the abdomen and pelvis was performed following the standard protocol without IV contrast. COMPARISON:  CT abdomen pelvis dated May 15, 2017. FINDINGS: Lower chest: Right greater than left pleural effusions with adjacent atelectasis. Coronary artery atherosclerotic calcifications. Hepatobiliary: No focal liver abnormality is seen. Status post cholecystectomy. No  biliary dilatation. Pancreas: Unremarkable. Spleen: Unremarkable. Adrenals/Urinary Tract: Mild thickening of the left adrenal gland without discrete nodule, unchanged. The right adrenal gland is unremarkable. No renal calculi. No hydronephrosis. Unchanged 11 mm exophytic hyperdense lesion arising from the inferior pole of the left kidney, likely a complicated cyst. Bladder is unremarkable. Stomach/Bowel: Stomach is within normal limits. Appendix is surgically absent. No evidence of bowel wall thickening, distention, or inflammatory changes. Vascular/Lymphatic: Aortic atherosclerosis. No enlarged abdominal or pelvic lymph nodes. Reproductive:  Status post hysterectomy. No adnexal masses. Other: Trace perihepatic and perisplenic ascites. Small amount of fluid in the pelvis. No abdominal wall hernia. Musculoskeletal: No acute or significant osseous findings. Diffuse osteopenia. IMPRESSION: 1. No acute intra-abdominal process. Trace perihepatic and perisplenic ascites. 2. Right greater than left pleural effusions. 3.  Aortic atherosclerosis (ICD10-I70.0). Electronically Signed   By: Titus Dubin M.D.   On: 07/31/2017 16:52   Dg Abd 2 Views  Result Date: 07/31/2017 CLINICAL DATA:  Abdominal pain. EXAM: ABDOMEN - 2 VIEW COMPARISON:  07/30/2017. FINDINGS: Surgical clips right upper quadrant. Soft tissue structures are unremarkable. Densities noted over the abdomen most likely related to clothing. No bowel distention or free air. Degenerative changes lumbar spine. Scoliosis lumbar spine concave right. Degenerative changes both hips. Aortoiliac atherosclerotic vascular calcification. Pelvic calcifications consistent phleboliths. IMPRESSION: No acute abnormality.  No evidence of bowel distention or free air. Electronically Signed   By: Marcello Moores  Register   On: 07/31/2017 14:25   Dg Abd 2 Views  Result Date: 07/30/2017 CLINICAL DATA:  Diarrhea, abdominal pain for 3 weeks EXAM: ABDOMEN - 2 VIEW COMPARISON:  None. FINDINGS: There is no bowel dilatation to suggest obstruction. There is no evidence of pneumoperitoneum, portal venous gas or pneumatosis. There are no pathologic calcifications along the expected course of the ureters. The osseous structures are unremarkable. IMPRESSION: Negative. Electronically Signed   By: Kathreen Devoid   On: 07/30/2017 13:34    Procedures Procedures (including critical care time)  Medications Ordered in ED Medications  iopamidol (ISOVUE-300) 61 % injection (not administered)  iopamidol (ISOVUE-300) 61 % injection 30 mL (30 mLs Oral Contrast Given 07/31/17 1454)  ondansetron (ZOFRAN) injection 4 mg (4 mg Intravenous  Given 07/31/17 1517)     Initial Impression / Assessment and Plan / ED Course  I have reviewed the triage vital signs and the nursing notes.  Pertinent labs & imaging results that were available during my care of the patient were reviewed by me and considered in my medical decision making (see chart for details).     Long discussion had with multiple family members regarding direction of patient's care.  The focus at this time is on comfort only.  Hospice is been involved.  Patient's blood sugar will not be treated given comfort measures only.  Labs reviewed and aware of abnormalities.   Comfort care only  Final Clinical Impressions(s) / ED Diagnoses   Final diagnoses:  Abdominal cramping    New Prescriptions New Prescriptions   No medications on file     Jola Schmidt, MD 07/31/17 1659

## 2017-07-31 NOTE — Discharge Instructions (Signed)
Pt is to have care provided by hospice.  Pt is requiring palliative care only for comfort.

## 2017-07-31 NOTE — Care Management Note (Signed)
Case Management Note  Patient Details  Name: Cassandra [name] MRN: [id number] Date of Birth: Jul 29, 1946  Subjective/Objective: 71 year old female with PMH of DM 2/IDDM, recurrent episodes of DKA, brittle DM, stage IV chronic kidney disease, nephrotic syndrome, HTN, HLD, chronic anemia, C. difficile colitis, multiple recent hospitalizations (5 in the last 6 months), most recently 7/17-7/21 when she presented with syncope within 12 hours after discharge from SNF and found to be hypoglycemic with blood sugars in the 30s, sent to ED from SNF on 07/22/17 due to CBGs in the 200s over the last couple of weeks which increased to 500's on day of admission, did not respond adequately to insulin's and fluids, abdominal discomfort, nausea and ongoing diarrhea, noted to be in DKA and admitted to stepdown on insulin drip protocol, became hypoglycemic same night, insulin drip discontinued.    [facility] due to N/V/D and rectal pain.  Pt is a readmission.  Called by Bambi with [facility] to state pt's daughter on arrival to the ED has signed a reversion of services with [facility]ospice Care.                   Action/Plan: CM waiting on EDP for CM needs.  Expected Discharge Date:   Unknown               Expected Discharge Plan:  Home w Hospice Care  Discharge planning Services  CM Consult  Status of Service:  In process, will continue to follow  Lauris PoagWheeler, Teddy Pena N, RN 07/31/2017, 1:33 PM

## 2017-07-31 NOTE — ED Notes (Signed)
[facility] CALLED FOR TRANSPORT. 

## 2017-07-31 NOTE — ED Notes (Signed)
Patient transported to CT 

## 2017-08-01 ENCOUNTER — Telehealth: Payer: Self-pay | Admitting: Emergency Medicine

## 2017-08-16 NOTE — Telephone Encounter (Signed)
CM noted pt was D/C yesterday and it was unclear to CM what hospice service, if any, had taken the pt's case since [facility] had dismissed the pt yesterday on arrival to the ED.  CM called pt's phone and was advised pt died this morning.  CM asked if hospice had been involved and was told "somewhat."  CM gave condolences to family.  No further CM needs noted at this time.

## 2017-08-16 DEATH — deceased

## 2018-04-17 IMAGING — MR MR MRA HEAD W/O CM
4 of 6 series · 19 of 48 positions shown · non-contrast
Comparison: MRI head 05/15/2016

CLINICAL DATA: Stroke

EXAM:
MRA NECK WITHOUT   CONTRAST
MRA HEAD WITHOUT CONTRAST
TECHNIQUE: Multiplanar and multiecho pulse sequences of the neck were obtained
without intravenous contrast. Angiographic images of the neck were
obtained using MRA technique without and with intravenous contast.;
Angiographic images of the Circle of Willis were obtained using MRA
technique without intravenous contrast.

[Series 3: (id) mt fs · axial · 1.4mm · 0.43mm/px · z∈[-68,+26]mm · 9 of 136 slices shown]
[im 1/136]
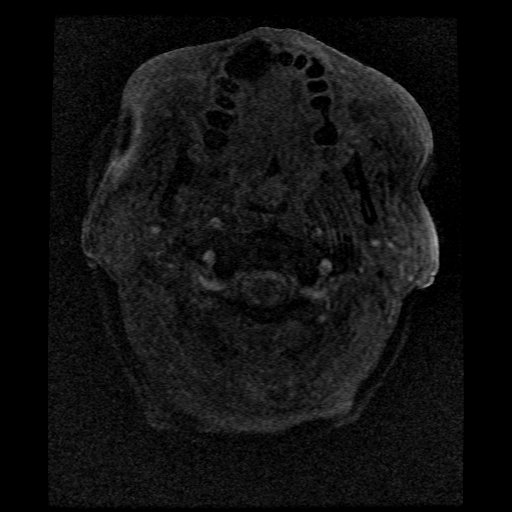
[im 17/136]
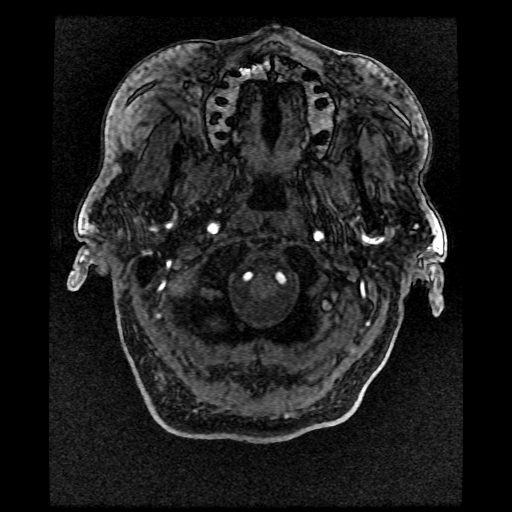
[im 34/136]
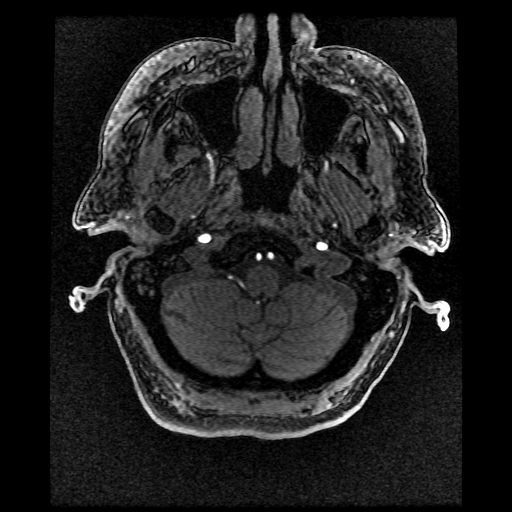
[im 51/136]
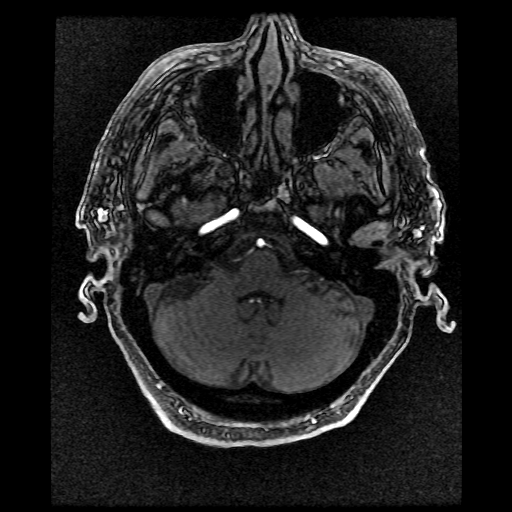
[im 68/136]
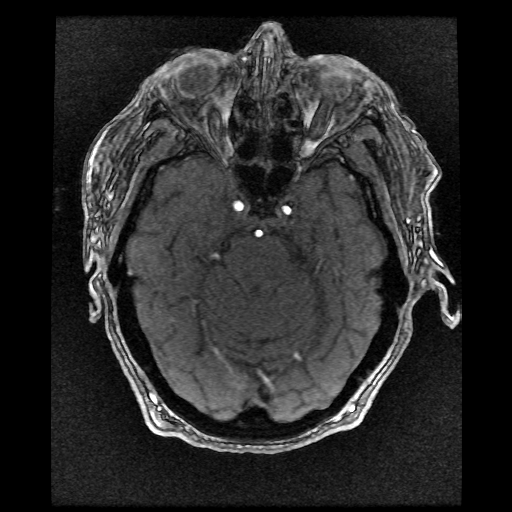
[im 85/136]
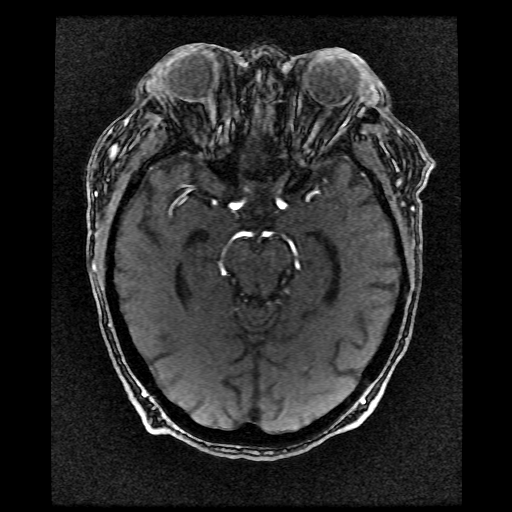
[im 102/136]
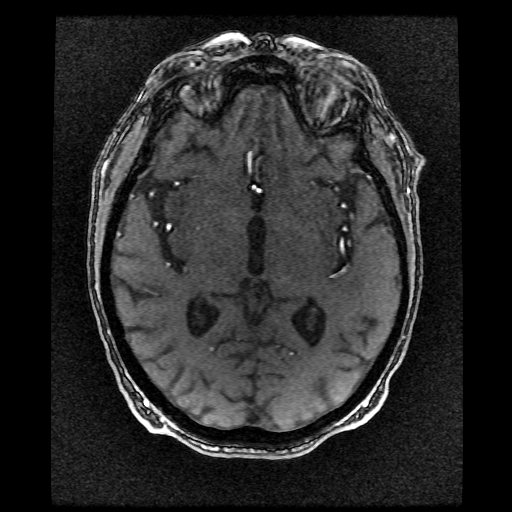
[im 119/136]
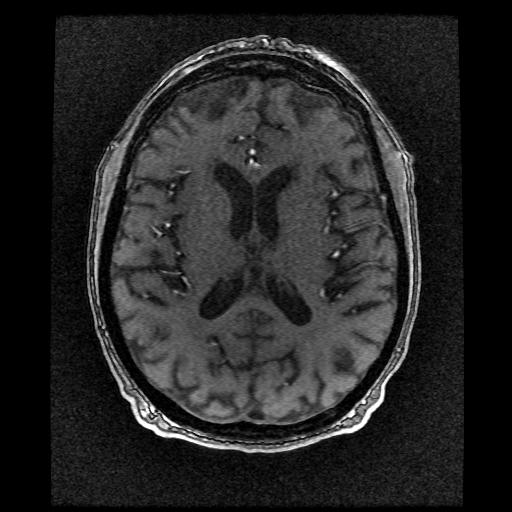
[im 136/136]
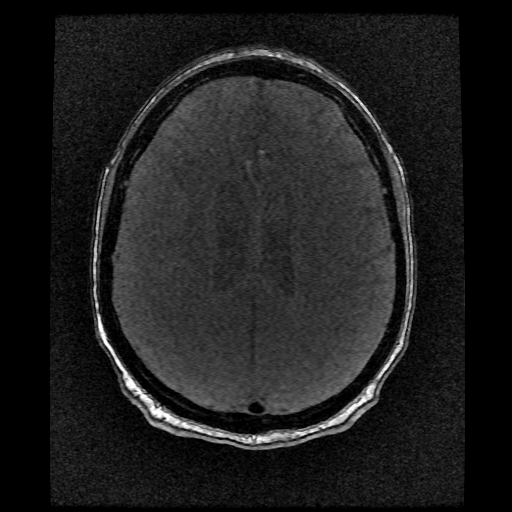

[Series 6: ax (id) · axial · 2.8mm · 0.47mm/px · z∈[-220,-29]mm · 8 of 140 slices shown]
[im 1/140]
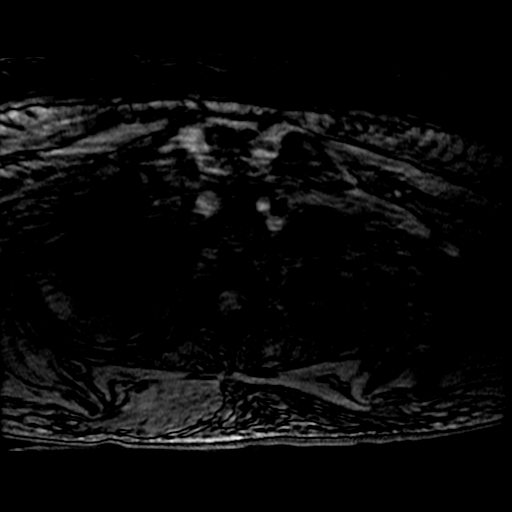
[im 16/140]
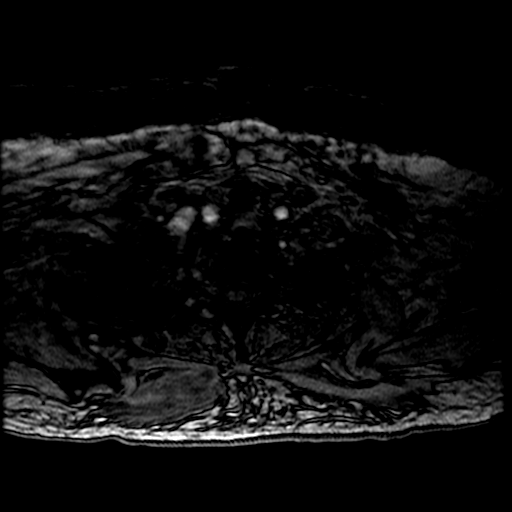
[im 47/140]
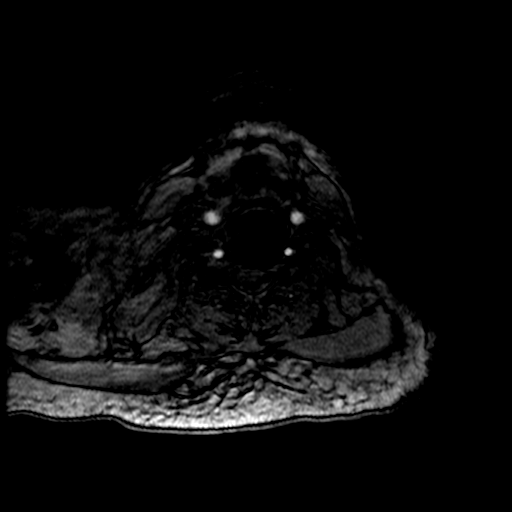
[im 62/140]
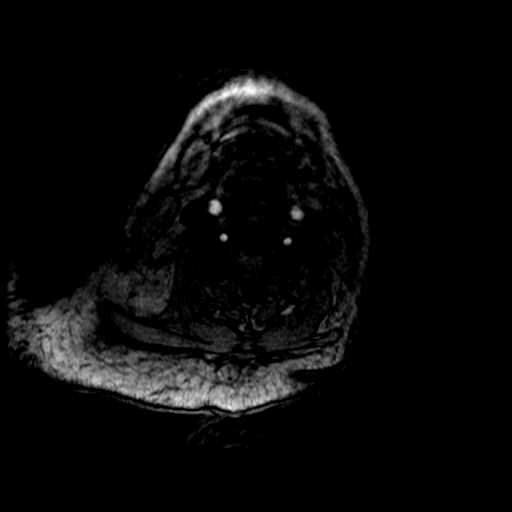
[im 78/140]
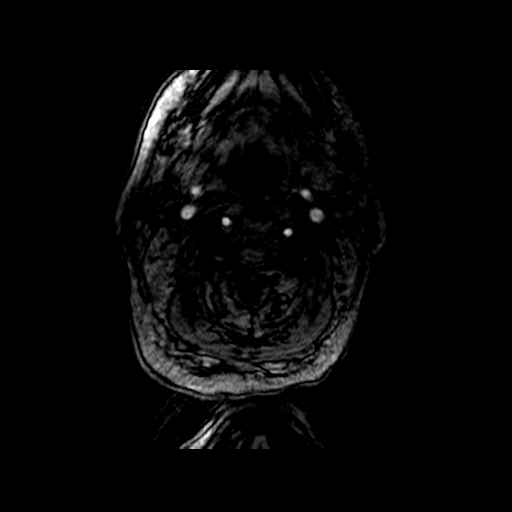
[im 93/140]
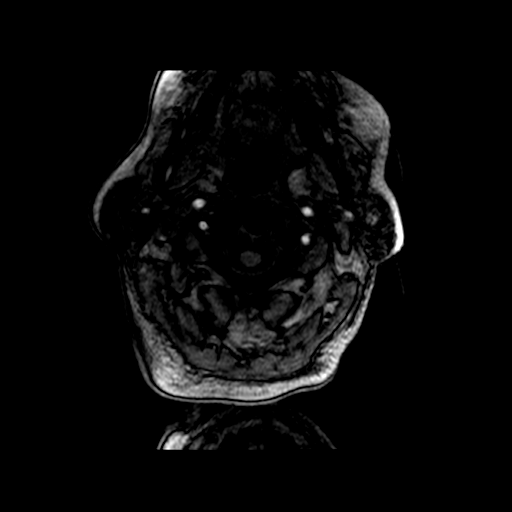
[im 124/140]
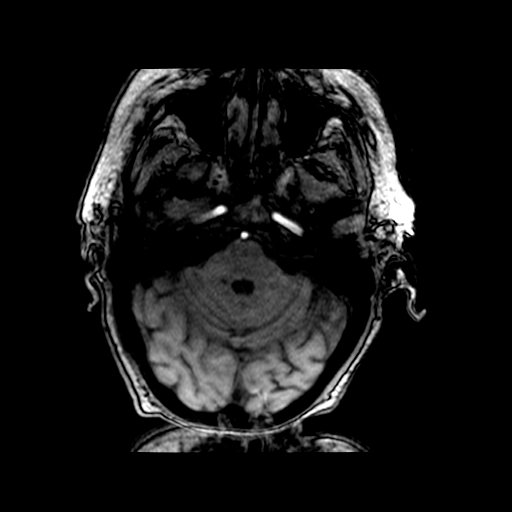
[im 140/140]
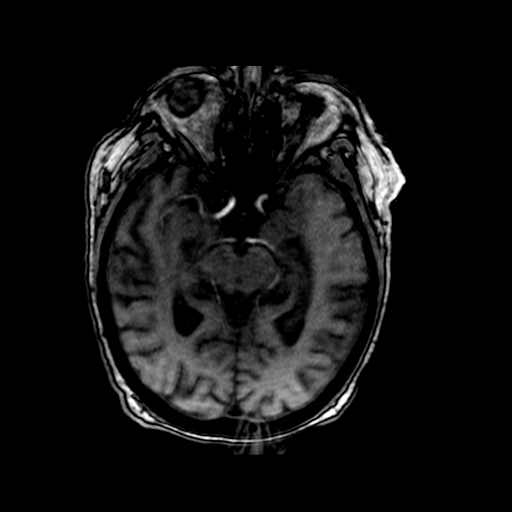

[Series 300: col:(id) mt fs · axial · 1.4mm · 0.43mm/px · 1 of 1 slices shown]
[im 1/1]
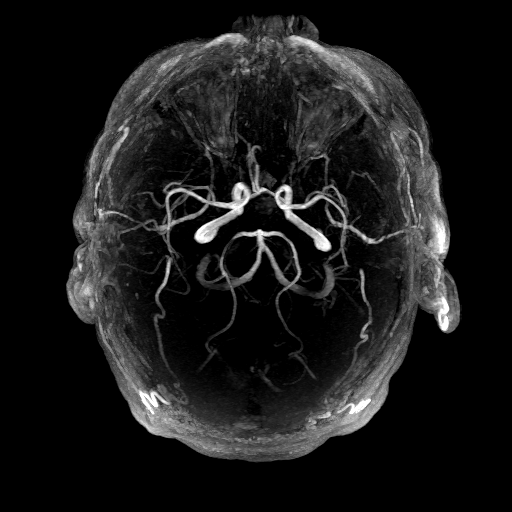

[Series 600: col:ax (id) · axial · 2.8mm · 0.47mm/px · 1 of 1 slices shown]
[im 1/1]
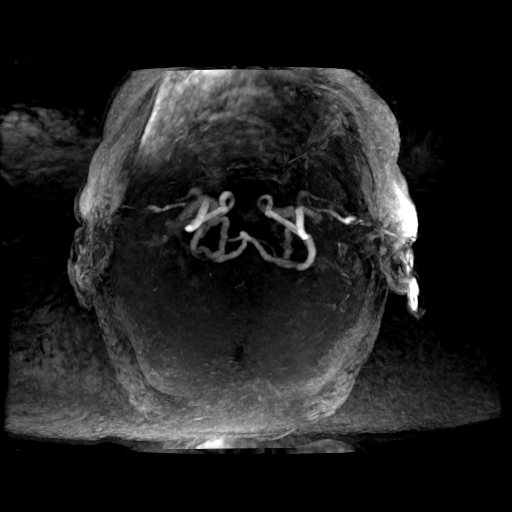

[19 of 48 positions shown; findings below may reference images not displayed]

FINDINGS: MRA NECK FINDINGS

Both vertebral arteries patent to the basilar without stenosis.
PICA, AICA patent bilaterally. Basilar widely patent. Superior
cerebellar and posterior cerebral arteries patent. Moderate stenosis
proximal right posterior cerebral artery. Left PCA widely patent.

Internal carotid artery widely patent bilaterally without stenosis.
Anterior and middle cerebral arteries patent bilaterally without
stenosis or occlusion.

Negative for cerebral aneurysm.

MRA HEAD FINDINGS

Carotid bifurcation widely patent bilaterally. No significant
carotid stenosis.

Both vertebral arteries patent to the basilar without stenosis.
IMPRESSION: Moderate stenosis proximal right posterior cerebral artery.
Otherwise negative MRA of the brain

No significant carotid or vertebral stenosis in the neck.

## 2019-04-10 IMAGING — CR DG CHEST 2V
2 series · 2 of 2 positions shown · non-contrast
Comparison: 01/07/2017 and previous

CLINICAL DATA: Edema to lower extremity over last several days and
awoke with new edema to bilat hands and face with eyes almost
swollen shut, some cough, no other chest complaints

EXAM:
CHEST - 2 VIEW

[x chest ap]
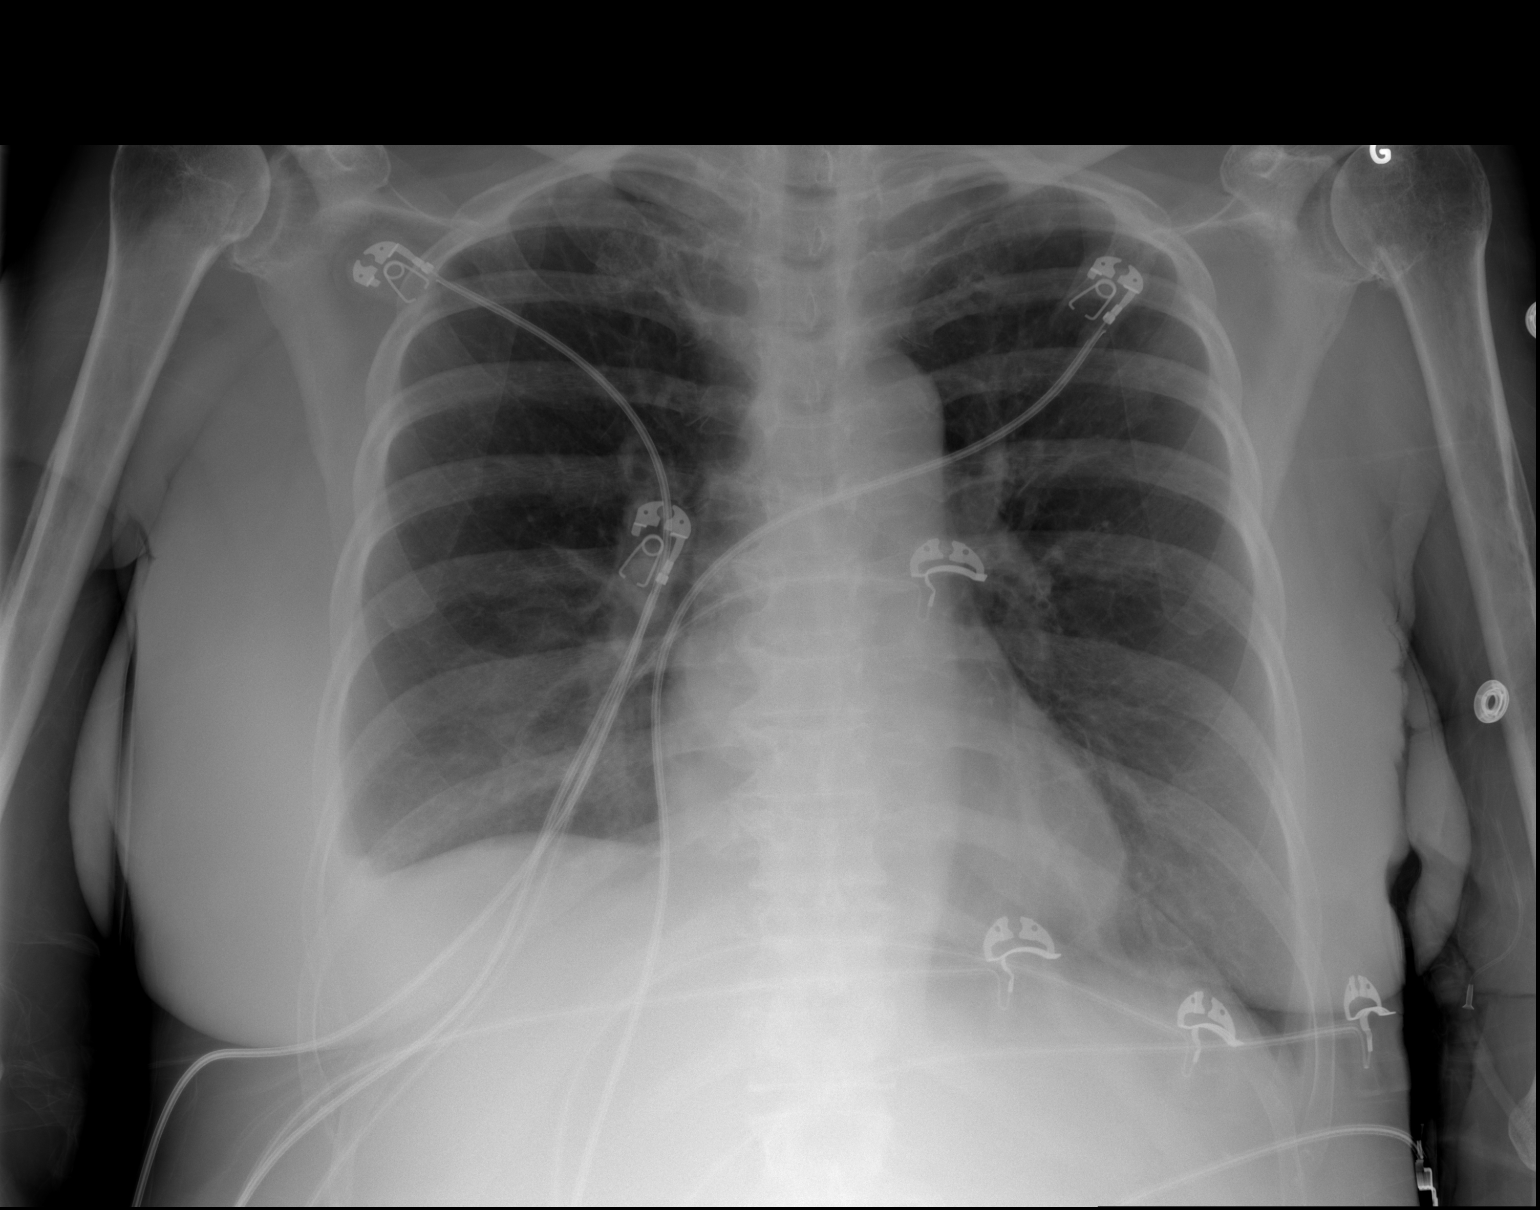

[w chest lat]
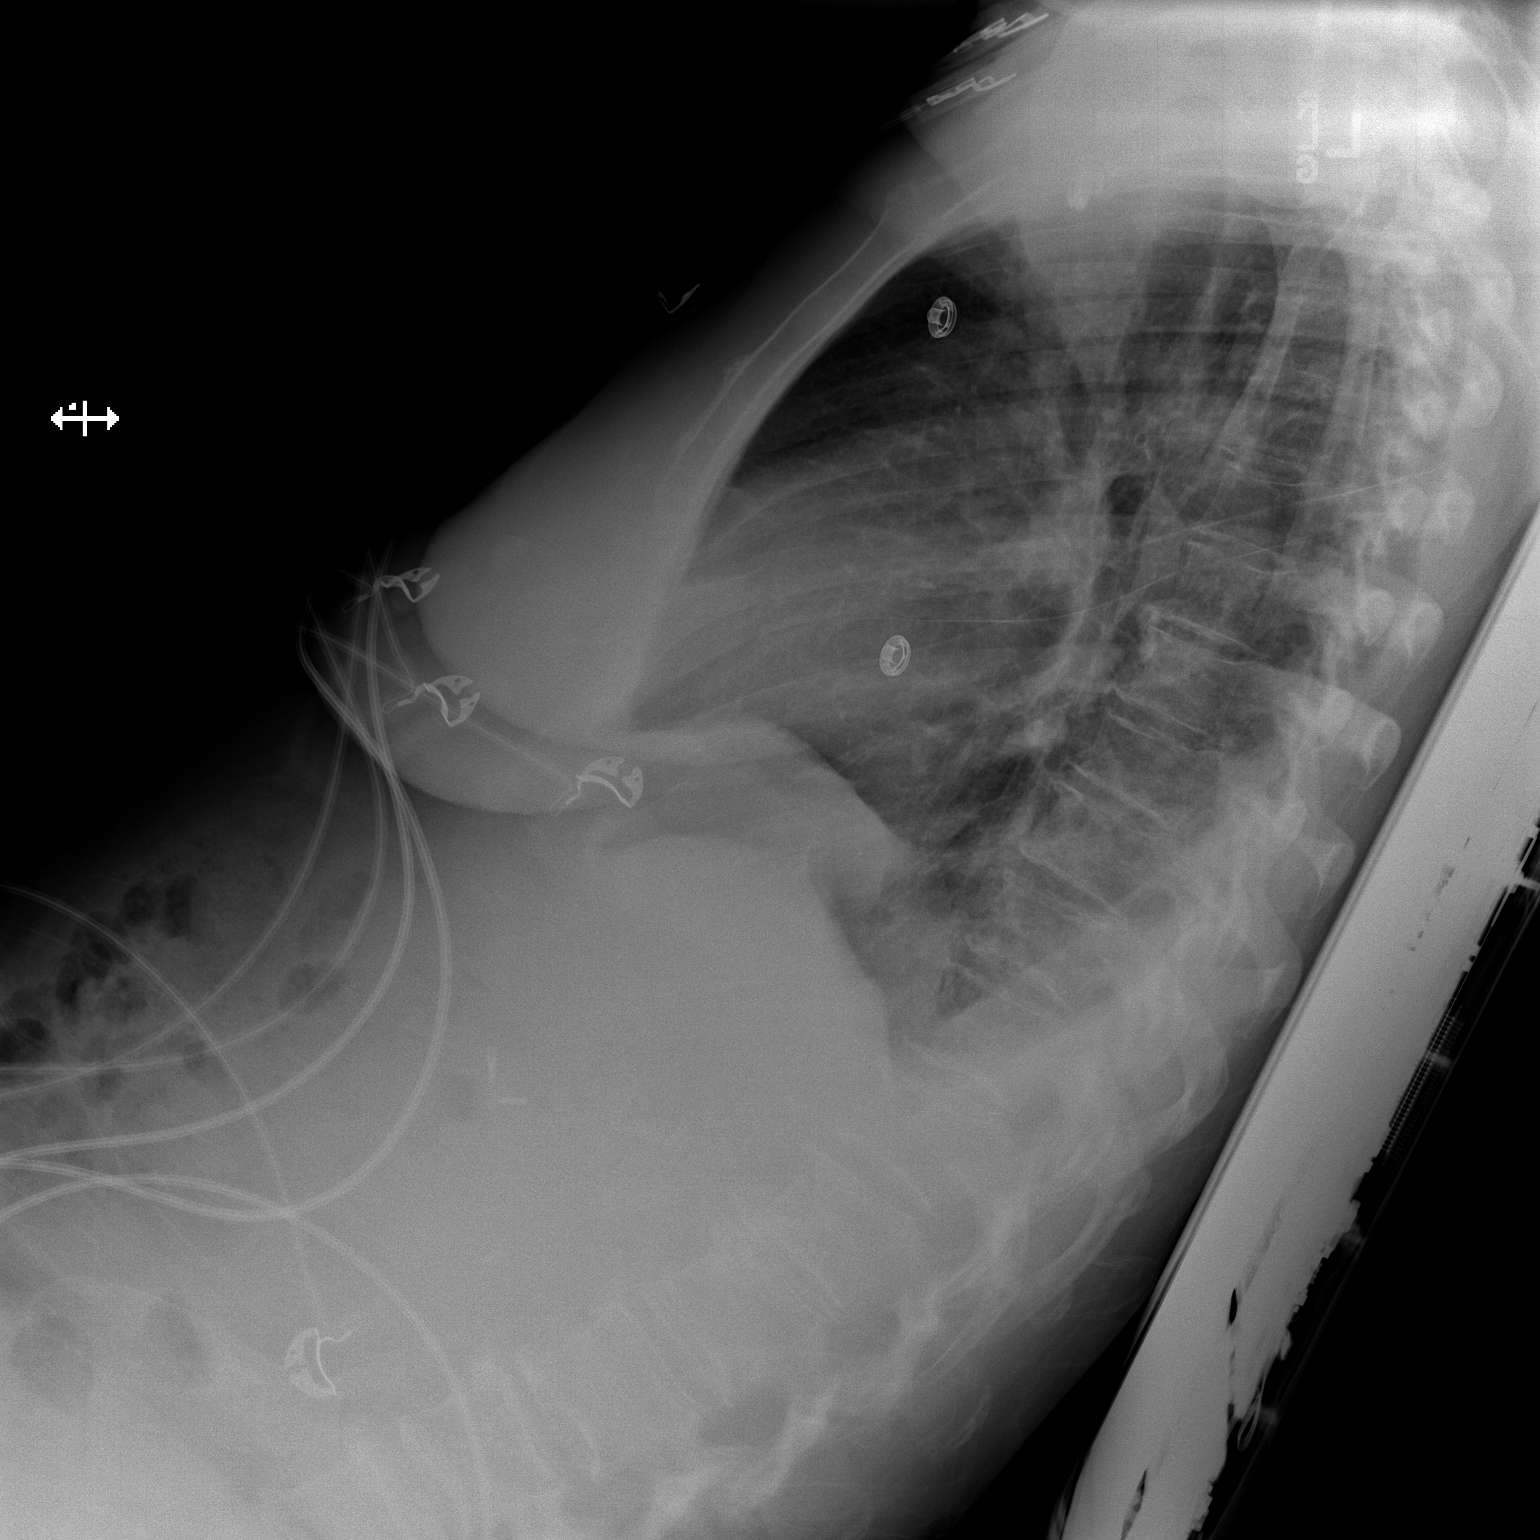

[2 of 2 positions shown; findings below may reference images not displayed]

FINDINGS: Small bilateral pleural effusions with adjacent atelectasis/
consolidation posteriorly in the lower lobes.Lungs are otherwise
clear.

Heart size and mediastinal contours are within normal limits.

No pneumothorax.

Cervical fixation hardware partially visualized. Surgical clips in
the upper abdomen.
IMPRESSION: Small bilateral pleural effusions, new since prior study
